# Patient Record
Sex: Female | Born: 1970 | Hispanic: No | Marital: Married | Smoking: Never smoker
Health system: Southern US, Community
[De-identification: ages and names within clinical notes are randomized; demographics above are authoritative.]

## PROBLEM LIST (undated history)

## (undated) DIAGNOSIS — F32A Depression, unspecified: Secondary | ICD-10-CM

## (undated) DIAGNOSIS — H5440 Blindness, one eye, unspecified eye: Secondary | ICD-10-CM

## (undated) DIAGNOSIS — Z8781 Personal history of (healed) traumatic fracture: Secondary | ICD-10-CM

## (undated) DIAGNOSIS — R87612 Low grade squamous intraepithelial lesion on cytologic smear of cervix (LGSIL): Secondary | ICD-10-CM

## (undated) DIAGNOSIS — I4891 Unspecified atrial fibrillation: Secondary | ICD-10-CM

## (undated) DIAGNOSIS — H209 Unspecified iridocyclitis: Secondary | ICD-10-CM

## (undated) DIAGNOSIS — E119 Type 2 diabetes mellitus without complications: Secondary | ICD-10-CM

## (undated) DIAGNOSIS — H547 Unspecified visual loss: Secondary | ICD-10-CM

## (undated) DIAGNOSIS — E785 Hyperlipidemia, unspecified: Secondary | ICD-10-CM

## (undated) DIAGNOSIS — I10 Essential (primary) hypertension: Secondary | ICD-10-CM

## (undated) DIAGNOSIS — H4040X0 Glaucoma secondary to eye inflammation, unspecified eye, stage unspecified: Secondary | ICD-10-CM

## (undated) DIAGNOSIS — L659 Nonscarring hair loss, unspecified: Secondary | ICD-10-CM

## (undated) DIAGNOSIS — Z412 Encounter for routine and ritual male circumcision: Secondary | ICD-10-CM

## (undated) DIAGNOSIS — D509 Iron deficiency anemia, unspecified: Secondary | ICD-10-CM

## (undated) HISTORY — DX: Low grade squamous intraepithelial lesion on cytologic smear of cervix (LGSIL): R87.612

## (undated) HISTORY — DX: Glaucoma secondary to eye inflammation, unspecified eye, stage unspecified: H40.40X0

## (undated) HISTORY — DX: Hyperlipidemia, unspecified: E78.5

## (undated) HISTORY — DX: Nonscarring hair loss, unspecified: L65.9

## (undated) HISTORY — DX: Iron deficiency anemia, unspecified: D50.9

## (undated) HISTORY — DX: Essential (primary) hypertension: I10

## (undated) HISTORY — DX: Encounter for routine and ritual male circumcision: Z41.2

## (undated) HISTORY — DX: Unspecified visual loss: H54.7

## (undated) HISTORY — DX: Type 2 diabetes mellitus without complications: E11.9

## (undated) HISTORY — DX: Unspecified iridocyclitis: H20.9

## (undated) HISTORY — DX: Personal history of (healed) traumatic fracture: Z87.81

## (undated) HISTORY — PX: EYE SURGERY: SHX253

## (undated) HISTORY — PX: MULTIPLE TOOTH EXTRACTIONS: SHX2053

---

## 1998-04-22 DIAGNOSIS — E119 Type 2 diabetes mellitus without complications: Secondary | ICD-10-CM | POA: Insufficient documentation

## 1999-09-11 ENCOUNTER — Emergency Department (HOSPITAL_COMMUNITY): Admission: EM | Admit: 1999-09-11 | Discharge: 1999-09-11 | Payer: Self-pay | Admitting: Emergency Medicine

## 2000-04-29 ENCOUNTER — Emergency Department (HOSPITAL_COMMUNITY): Admission: EM | Admit: 2000-04-29 | Discharge: 2000-04-30 | Payer: Self-pay | Admitting: Emergency Medicine

## 2001-02-24 ENCOUNTER — Encounter: Admission: RE | Admit: 2001-02-24 | Discharge: 2001-05-25 | Payer: Self-pay | Admitting: Family Medicine

## 2001-07-07 ENCOUNTER — Other Ambulatory Visit: Admission: RE | Admit: 2001-07-07 | Discharge: 2001-07-07 | Payer: Self-pay | Admitting: Obstetrics and Gynecology

## 2001-07-09 ENCOUNTER — Encounter: Admission: RE | Admit: 2001-07-09 | Discharge: 2001-10-07 | Payer: Self-pay | Admitting: Obstetrics and Gynecology

## 2001-08-26 ENCOUNTER — Encounter: Payer: Self-pay | Admitting: Obstetrics and Gynecology

## 2001-08-26 ENCOUNTER — Ambulatory Visit (HOSPITAL_COMMUNITY): Admission: RE | Admit: 2001-08-26 | Discharge: 2001-08-26 | Payer: Self-pay | Admitting: Obstetrics and Gynecology

## 2001-10-26 ENCOUNTER — Ambulatory Visit (HOSPITAL_COMMUNITY): Admission: RE | Admit: 2001-10-26 | Discharge: 2001-10-26 | Payer: Self-pay | Admitting: Obstetrics and Gynecology

## 2001-10-26 ENCOUNTER — Encounter: Payer: Self-pay | Admitting: Obstetrics and Gynecology

## 2001-10-27 ENCOUNTER — Ambulatory Visit (HOSPITAL_COMMUNITY): Admission: RE | Admit: 2001-10-27 | Discharge: 2001-10-27 | Payer: Self-pay | Admitting: Obstetrics and Gynecology

## 2001-10-27 ENCOUNTER — Encounter: Payer: Self-pay | Admitting: Obstetrics and Gynecology

## 2001-11-17 ENCOUNTER — Inpatient Hospital Stay (HOSPITAL_COMMUNITY): Admission: AD | Admit: 2001-11-17 | Discharge: 2001-11-20 | Payer: Self-pay | Admitting: Obstetrics and Gynecology

## 2001-11-17 ENCOUNTER — Encounter: Payer: Self-pay | Admitting: Obstetrics and Gynecology

## 2001-12-07 ENCOUNTER — Encounter (INDEPENDENT_AMBULATORY_CARE_PROVIDER_SITE_OTHER): Payer: Self-pay | Admitting: Specialist

## 2001-12-07 ENCOUNTER — Inpatient Hospital Stay (HOSPITAL_COMMUNITY): Admission: AD | Admit: 2001-12-07 | Discharge: 2001-12-10 | Payer: Self-pay | Admitting: Obstetrics and Gynecology

## 2003-07-12 ENCOUNTER — Encounter: Admission: RE | Admit: 2003-07-12 | Discharge: 2003-07-12 | Payer: Self-pay | Admitting: Internal Medicine

## 2003-07-20 ENCOUNTER — Encounter: Admission: RE | Admit: 2003-07-20 | Discharge: 2003-07-20 | Payer: Self-pay | Admitting: Internal Medicine

## 2003-10-06 ENCOUNTER — Encounter: Admission: RE | Admit: 2003-10-06 | Discharge: 2003-10-06 | Payer: Self-pay | Admitting: Internal Medicine

## 2003-10-20 ENCOUNTER — Encounter: Admission: RE | Admit: 2003-10-20 | Discharge: 2003-10-20 | Payer: Self-pay | Admitting: Internal Medicine

## 2003-12-06 ENCOUNTER — Encounter (INDEPENDENT_AMBULATORY_CARE_PROVIDER_SITE_OTHER): Payer: Self-pay | Admitting: *Deleted

## 2003-12-06 ENCOUNTER — Encounter: Admission: RE | Admit: 2003-12-06 | Discharge: 2003-12-06 | Payer: Self-pay | Admitting: Internal Medicine

## 2004-02-04 ENCOUNTER — Emergency Department (HOSPITAL_COMMUNITY): Admission: EM | Admit: 2004-02-04 | Discharge: 2004-02-04 | Payer: Self-pay | Admitting: Emergency Medicine

## 2004-06-29 ENCOUNTER — Ambulatory Visit: Payer: Self-pay | Admitting: Internal Medicine

## 2004-07-12 ENCOUNTER — Emergency Department (HOSPITAL_COMMUNITY): Admission: EM | Admit: 2004-07-12 | Discharge: 2004-07-12 | Payer: Self-pay | Admitting: Emergency Medicine

## 2004-07-16 ENCOUNTER — Encounter (INDEPENDENT_AMBULATORY_CARE_PROVIDER_SITE_OTHER): Payer: Self-pay | Admitting: Pulmonary Disease

## 2004-07-16 ENCOUNTER — Ambulatory Visit: Payer: Self-pay | Admitting: Internal Medicine

## 2004-07-16 LAB — CONVERTED CEMR LAB: Pap Smear: NORMAL

## 2004-09-19 ENCOUNTER — Ambulatory Visit: Payer: Self-pay | Admitting: Internal Medicine

## 2004-09-21 ENCOUNTER — Ambulatory Visit: Payer: Self-pay | Admitting: Internal Medicine

## 2004-11-14 ENCOUNTER — Ambulatory Visit (HOSPITAL_COMMUNITY): Admission: RE | Admit: 2004-11-14 | Discharge: 2004-11-14 | Payer: Self-pay | Admitting: Internal Medicine

## 2004-11-14 ENCOUNTER — Ambulatory Visit: Payer: Self-pay | Admitting: Internal Medicine

## 2004-11-28 ENCOUNTER — Ambulatory Visit (HOSPITAL_COMMUNITY): Admission: RE | Admit: 2004-11-28 | Discharge: 2004-11-28 | Payer: Self-pay | Admitting: Internal Medicine

## 2004-11-28 ENCOUNTER — Ambulatory Visit: Payer: Self-pay | Admitting: Internal Medicine

## 2004-12-25 ENCOUNTER — Ambulatory Visit: Payer: Self-pay | Admitting: Internal Medicine

## 2005-03-01 ENCOUNTER — Emergency Department (HOSPITAL_COMMUNITY): Admission: EM | Admit: 2005-03-01 | Discharge: 2005-03-01 | Payer: Self-pay | Admitting: Family Medicine

## 2005-03-05 ENCOUNTER — Ambulatory Visit: Payer: Self-pay | Admitting: Internal Medicine

## 2005-03-18 ENCOUNTER — Ambulatory Visit: Payer: Self-pay | Admitting: Internal Medicine

## 2005-03-19 ENCOUNTER — Ambulatory Visit: Payer: Self-pay | Admitting: Obstetrics & Gynecology

## 2005-03-27 ENCOUNTER — Ambulatory Visit: Payer: Self-pay | Admitting: Internal Medicine

## 2005-04-25 ENCOUNTER — Ambulatory Visit: Payer: Self-pay | Admitting: Family Medicine

## 2005-05-01 ENCOUNTER — Ambulatory Visit (HOSPITAL_COMMUNITY): Admission: RE | Admit: 2005-05-01 | Discharge: 2005-05-01 | Payer: Self-pay | Admitting: *Deleted

## 2005-05-25 ENCOUNTER — Emergency Department (HOSPITAL_COMMUNITY): Admission: EM | Admit: 2005-05-25 | Discharge: 2005-05-25 | Payer: Self-pay | Admitting: Emergency Medicine

## 2005-05-30 ENCOUNTER — Ambulatory Visit: Payer: Self-pay | Admitting: Internal Medicine

## 2005-06-05 ENCOUNTER — Ambulatory Visit: Payer: Self-pay | Admitting: Internal Medicine

## 2005-06-07 ENCOUNTER — Encounter: Admission: RE | Admit: 2005-06-07 | Discharge: 2005-06-07 | Payer: Self-pay | Admitting: Obstetrics and Gynecology

## 2005-08-12 ENCOUNTER — Ambulatory Visit: Payer: Self-pay | Admitting: Hospitalist

## 2005-10-07 ENCOUNTER — Ambulatory Visit: Payer: Self-pay | Admitting: Internal Medicine

## 2005-10-24 ENCOUNTER — Ambulatory Visit: Payer: Self-pay | Admitting: Internal Medicine

## 2006-01-31 ENCOUNTER — Encounter (INDEPENDENT_AMBULATORY_CARE_PROVIDER_SITE_OTHER): Payer: Self-pay | Admitting: Pulmonary Disease

## 2006-01-31 ENCOUNTER — Ambulatory Visit: Payer: Self-pay | Admitting: Internal Medicine

## 2006-02-05 ENCOUNTER — Encounter (INDEPENDENT_AMBULATORY_CARE_PROVIDER_SITE_OTHER): Payer: Self-pay | Admitting: Pulmonary Disease

## 2006-02-20 ENCOUNTER — Ambulatory Visit: Payer: Self-pay | Admitting: Internal Medicine

## 2006-03-06 ENCOUNTER — Ambulatory Visit: Payer: Self-pay | Admitting: Internal Medicine

## 2006-03-20 ENCOUNTER — Ambulatory Visit: Payer: Self-pay | Admitting: Internal Medicine

## 2006-04-08 ENCOUNTER — Ambulatory Visit: Payer: Self-pay | Admitting: Internal Medicine

## 2006-04-08 ENCOUNTER — Encounter (INDEPENDENT_AMBULATORY_CARE_PROVIDER_SITE_OTHER): Payer: Self-pay | Admitting: Pulmonary Disease

## 2006-04-08 LAB — CONVERTED CEMR LAB
Albumin: 4.1 g/dL (ref 3.5–5.2)
Alkaline Phosphatase: 101 units/L (ref 39–117)
BUN: 13 mg/dL (ref 6–23)
CO2: 23 meq/L (ref 19–32)
Creatinine, Ser: 0.53 mg/dL (ref 0.40–1.20)
Glucose, Bld: 114 mg/dL — ABNORMAL HIGH (ref 70–99)
Glucose, Bld: 155 mg/dL
Total Bilirubin: 0.2 mg/dL — ABNORMAL LOW (ref 0.3–1.2)
Total Protein: 7.6 g/dL (ref 6.0–8.3)

## 2006-05-05 DIAGNOSIS — E785 Hyperlipidemia, unspecified: Secondary | ICD-10-CM | POA: Insufficient documentation

## 2006-09-29 ENCOUNTER — Ambulatory Visit: Payer: Self-pay | Admitting: Internal Medicine

## 2006-09-29 ENCOUNTER — Inpatient Hospital Stay (HOSPITAL_COMMUNITY): Admission: AD | Admit: 2006-09-29 | Discharge: 2006-09-29 | Payer: Self-pay | Admitting: Gynecology

## 2006-09-29 LAB — CONVERTED CEMR LAB
Beta hcg, urine, semiquantitative: NEGATIVE
Glucose, Urine, Semiquant: NEGATIVE
Hgb A1c MFr Bld: 10.6 %
Specific Gravity, Urine: 1.02

## 2007-01-28 ENCOUNTER — Emergency Department (HOSPITAL_COMMUNITY): Admission: EM | Admit: 2007-01-28 | Discharge: 2007-01-29 | Payer: Self-pay | Admitting: Emergency Medicine

## 2007-02-05 ENCOUNTER — Ambulatory Visit: Payer: Self-pay | Admitting: Hospitalist

## 2007-02-05 ENCOUNTER — Encounter (INDEPENDENT_AMBULATORY_CARE_PROVIDER_SITE_OTHER): Payer: Self-pay | Admitting: Infectious Diseases

## 2007-02-05 LAB — CONVERTED CEMR LAB: Blood Glucose, Fingerstick: 269

## 2007-02-06 LAB — CONVERTED CEMR LAB
Bilirubin Urine: NEGATIVE
CO2: 22 meq/L (ref 19–32)
Chloride: 102 meq/L (ref 96–112)
Creatinine, Ser: 0.54 mg/dL (ref 0.40–1.20)
Nitrite: NEGATIVE
Potassium: 4.3 meq/L (ref 3.5–5.3)
Urine Glucose: >1000 mg/dL — AB
pH: 6 (ref 5.0–8.0)

## 2007-02-09 ENCOUNTER — Telehealth: Payer: Self-pay | Admitting: *Deleted

## 2007-03-26 ENCOUNTER — Encounter: Payer: Self-pay | Admitting: Internal Medicine

## 2007-03-26 ENCOUNTER — Ambulatory Visit: Payer: Self-pay | Admitting: Internal Medicine

## 2007-03-26 ENCOUNTER — Ambulatory Visit (HOSPITAL_COMMUNITY): Admission: RE | Admit: 2007-03-26 | Discharge: 2007-03-26 | Payer: Self-pay | Admitting: Internal Medicine

## 2007-03-26 ENCOUNTER — Telehealth: Payer: Self-pay | Admitting: *Deleted

## 2007-03-27 ENCOUNTER — Encounter: Payer: Self-pay | Admitting: Internal Medicine

## 2007-07-10 ENCOUNTER — Telehealth: Payer: Self-pay | Admitting: *Deleted

## 2007-07-28 ENCOUNTER — Ambulatory Visit: Payer: Self-pay

## 2007-07-28 ENCOUNTER — Encounter (INDEPENDENT_AMBULATORY_CARE_PROVIDER_SITE_OTHER): Payer: Self-pay | Admitting: Internal Medicine

## 2007-07-28 LAB — CONVERTED CEMR LAB
Bilirubin Urine: NEGATIVE
Blood in Urine, dipstick: NEGATIVE
Glucose, Urine, Semiquant: >=1000
Hgb A1c MFr Bld: 11.3 %
Specific Gravity, Urine: 1.025
Urobilinogen, UA: 0.2
WBC Urine, dipstick: NEGATIVE
pH: 5

## 2007-07-29 ENCOUNTER — Encounter (INDEPENDENT_AMBULATORY_CARE_PROVIDER_SITE_OTHER): Payer: Self-pay | Admitting: Internal Medicine

## 2007-07-29 LAB — CONVERTED CEMR LAB
AST: 13 units/L (ref 0–37)
Alkaline Phosphatase: 100 units/L (ref 39–117)
BUN: 12 mg/dL (ref 6–23)
Calcium: 8.8 mg/dL (ref 8.4–10.5)
Creatinine, Ser: 0.59 mg/dL (ref 0.40–1.20)
Creatinine, Urine: 78.5 mg/dL
HDL: 54 mg/dL (ref >39)
Hemoglobin, Urine: NEGATIVE
LDL Cholesterol: 122 mg/dL — ABNORMAL HIGH (ref 0–99)
Leukocytes, UA: NEGATIVE
Microalb, Ur: 1.38 mg/dL (ref 0.00–1.89)
Protein, ur: NEGATIVE mg/dL
Total Bilirubin: 0.3 mg/dL (ref 0.3–1.2)
Total CHOL/HDL Ratio: 3.8
Urine Glucose: >1000 mg/dL — AB

## 2007-08-18 ENCOUNTER — Encounter (INDEPENDENT_AMBULATORY_CARE_PROVIDER_SITE_OTHER): Payer: Self-pay | Admitting: Internal Medicine

## 2007-08-18 ENCOUNTER — Ambulatory Visit: Payer: Self-pay | Admitting: Internal Medicine

## 2007-08-19 LAB — CONVERTED CEMR LAB
Gardnerella vaginalis: POSITIVE — AB
Trichomonal Vaginitis: NEGATIVE

## 2007-09-01 ENCOUNTER — Encounter (INDEPENDENT_AMBULATORY_CARE_PROVIDER_SITE_OTHER): Payer: Self-pay | Admitting: Internal Medicine

## 2007-09-01 ENCOUNTER — Ambulatory Visit: Payer: Self-pay | Admitting: Internal Medicine

## 2007-09-07 ENCOUNTER — Ambulatory Visit: Payer: Self-pay | Admitting: *Deleted

## 2007-09-07 LAB — CONVERTED CEMR LAB: Blood Glucose, Fingerstick: 437

## 2007-09-08 LAB — CONVERTED CEMR LAB
Iron: 29 ug/dL — ABNORMAL LOW (ref 42–145)
TSH: 2.145 microintl units/mL (ref 0.350–5.50)

## 2007-09-10 ENCOUNTER — Encounter (INDEPENDENT_AMBULATORY_CARE_PROVIDER_SITE_OTHER): Payer: Self-pay | Admitting: *Deleted

## 2007-09-17 ENCOUNTER — Ambulatory Visit: Payer: Self-pay | Admitting: Obstetrics & Gynecology

## 2007-10-13 ENCOUNTER — Encounter (INDEPENDENT_AMBULATORY_CARE_PROVIDER_SITE_OTHER): Payer: Self-pay | Admitting: Internal Medicine

## 2008-02-01 ENCOUNTER — Encounter: Payer: Self-pay | Admitting: Internal Medicine

## 2008-02-01 ENCOUNTER — Ambulatory Visit: Payer: Self-pay | Admitting: Internal Medicine

## 2008-02-01 LAB — CONVERTED CEMR LAB
ALT: 10 units/L (ref 0–35)
Alkaline Phosphatase: 91 units/L (ref 39–117)
Basophils Absolute: 0 10*3/uL (ref 0.0–0.1)
Basophils Relative: 1 % (ref 0–1)
Blood Glucose, Fingerstick: 353
Creatinine, Ser: 0.65 mg/dL (ref 0.40–1.20)
Eosinophils Absolute: 0.9 10*3/uL — ABNORMAL HIGH (ref 0.0–0.7)
Eosinophils Relative: 15 % — ABNORMAL HIGH (ref 0–5)
HCT: 36.4 % (ref 36.0–46.0)
Hgb A1c MFr Bld: 12.1 %
Ketones, ur: NEGATIVE mg/dL
LDL Cholesterol: 131 mg/dL — ABNORMAL HIGH (ref 0–99)
MCHC: 30.2 g/dL (ref 30.0–36.0)
MCV: 71.5 fL — ABNORMAL LOW (ref 78.0–100.0)
Microalb Creat Ratio: 9.5 mg/g (ref 0.0–30.0)
Nitrite: NEGATIVE
Platelets: 195 10*3/uL (ref 150–400)
Protein, ur: NEGATIVE mg/dL
RDW: 14.7 % (ref 11.5–15.5)
Sodium: 132 meq/L — ABNORMAL LOW (ref 135–145)
Specific Gravity, Urine: 1.048 — ABNORMAL HIGH (ref 1.005–1.03)
Total Bilirubin: 0.2 mg/dL — ABNORMAL LOW (ref 0.3–1.2)
Total CHOL/HDL Ratio: 3.5
Total Protein: 7.5 g/dL (ref 6.0–8.3)
Triglycerides: 122 mg/dL (ref <150)
Urobilinogen, UA: 0.2 (ref 0.0–1.0)
VLDL: 24 mg/dL (ref 0–40)
WBC, UA: NONE SEEN cells/hpf (ref <3)

## 2008-02-04 ENCOUNTER — Telehealth (INDEPENDENT_AMBULATORY_CARE_PROVIDER_SITE_OTHER): Payer: Self-pay | Admitting: *Deleted

## 2008-02-04 ENCOUNTER — Ambulatory Visit: Payer: Self-pay | Admitting: Infectious Disease

## 2008-02-04 LAB — CONVERTED CEMR LAB: Blood Glucose, Home Monitor: 2 mg/dL

## 2008-02-08 ENCOUNTER — Ambulatory Visit: Payer: Self-pay | Admitting: Internal Medicine

## 2008-02-08 ENCOUNTER — Telehealth: Payer: Self-pay | Admitting: Licensed Clinical Social Worker

## 2008-02-08 ENCOUNTER — Encounter (INDEPENDENT_AMBULATORY_CARE_PROVIDER_SITE_OTHER): Payer: Self-pay | Admitting: Internal Medicine

## 2008-02-08 DIAGNOSIS — H209 Unspecified iridocyclitis: Secondary | ICD-10-CM | POA: Insufficient documentation

## 2008-02-09 ENCOUNTER — Encounter (INDEPENDENT_AMBULATORY_CARE_PROVIDER_SITE_OTHER): Payer: Self-pay | Admitting: Internal Medicine

## 2008-02-10 ENCOUNTER — Ambulatory Visit (HOSPITAL_COMMUNITY): Admission: RE | Admit: 2008-02-10 | Discharge: 2008-02-10 | Payer: Self-pay | Admitting: Neurology

## 2008-02-10 ENCOUNTER — Encounter: Payer: Self-pay | Admitting: Internal Medicine

## 2008-02-10 ENCOUNTER — Ambulatory Visit: Payer: Self-pay | Admitting: Internal Medicine

## 2008-02-12 DIAGNOSIS — H4040X0 Glaucoma secondary to eye inflammation, unspecified eye, stage unspecified: Secondary | ICD-10-CM | POA: Insufficient documentation

## 2008-02-12 HISTORY — DX: Glaucoma secondary to eye inflammation, unspecified eye, stage unspecified: H40.40X0

## 2008-02-14 LAB — CONVERTED CEMR LAB
ALT: 9 units/L (ref 0–35)
AST: 14 units/L (ref 0–37)
CO2: 23 meq/L (ref 19–32)
CRP: 1.2 mg/dL — ABNORMAL HIGH (ref <0.6)
Calcium: 9 mg/dL (ref 8.4–10.5)
Chloride: 105 meq/L (ref 96–112)
Sed Rate: 17 mm/hr (ref 0–22)
Sodium: 135 meq/L (ref 135–145)
Total Protein: 7.1 g/dL (ref 6.0–8.3)

## 2008-02-23 ENCOUNTER — Ambulatory Visit: Payer: Self-pay | Admitting: Infectious Diseases

## 2008-02-23 ENCOUNTER — Encounter (INDEPENDENT_AMBULATORY_CARE_PROVIDER_SITE_OTHER): Payer: Self-pay | Admitting: Internal Medicine

## 2008-02-23 LAB — CONVERTED CEMR LAB: RBC Folate: 991 ng/mL — ABNORMAL HIGH (ref 180–600)

## 2008-02-29 DIAGNOSIS — D509 Iron deficiency anemia, unspecified: Secondary | ICD-10-CM | POA: Insufficient documentation

## 2008-02-29 LAB — CONVERTED CEMR LAB
Iron: 25 ug/dL — ABNORMAL LOW (ref 42–145)
Microalb Creat Ratio: 12.6 mg/g (ref 0.0–30.0)
Microalb, Ur: 0.67 mg/dL (ref 0.00–1.89)
Saturation Ratios: 5 % — ABNORMAL LOW (ref 20–55)
TIBC: 462 ug/dL (ref 250–470)
Vitamin B-12: 470 pg/mL (ref 211–911)

## 2008-03-01 ENCOUNTER — Encounter: Payer: Self-pay | Admitting: Internal Medicine

## 2008-03-01 ENCOUNTER — Ambulatory Visit: Payer: Self-pay | Admitting: Internal Medicine

## 2008-03-07 ENCOUNTER — Encounter (INDEPENDENT_AMBULATORY_CARE_PROVIDER_SITE_OTHER): Payer: Self-pay | Admitting: Internal Medicine

## 2008-03-07 ENCOUNTER — Ambulatory Visit: Payer: Self-pay | Admitting: *Deleted

## 2008-03-07 LAB — CONVERTED CEMR LAB

## 2008-03-14 ENCOUNTER — Encounter (INDEPENDENT_AMBULATORY_CARE_PROVIDER_SITE_OTHER): Payer: Self-pay | Admitting: Internal Medicine

## 2008-04-06 ENCOUNTER — Ambulatory Visit: Payer: Self-pay | Admitting: Internal Medicine

## 2008-04-06 ENCOUNTER — Encounter (INDEPENDENT_AMBULATORY_CARE_PROVIDER_SITE_OTHER): Payer: Self-pay | Admitting: Internal Medicine

## 2008-06-02 ENCOUNTER — Encounter: Payer: Self-pay | Admitting: Internal Medicine

## 2008-08-16 ENCOUNTER — Ambulatory Visit: Payer: Self-pay | Admitting: Internal Medicine

## 2008-08-16 ENCOUNTER — Encounter (INDEPENDENT_AMBULATORY_CARE_PROVIDER_SITE_OTHER): Payer: Self-pay | Admitting: Internal Medicine

## 2008-08-16 LAB — CONVERTED CEMR LAB: Blood Glucose, Fingerstick: 305

## 2008-08-17 ENCOUNTER — Telehealth (INDEPENDENT_AMBULATORY_CARE_PROVIDER_SITE_OTHER): Payer: Self-pay | Admitting: Internal Medicine

## 2008-09-28 ENCOUNTER — Ambulatory Visit: Payer: Self-pay | Admitting: Internal Medicine

## 2008-09-28 ENCOUNTER — Ambulatory Visit (HOSPITAL_COMMUNITY): Admission: RE | Admit: 2008-09-28 | Discharge: 2008-09-28 | Payer: Self-pay | Admitting: Internal Medicine

## 2008-09-28 LAB — CONVERTED CEMR LAB: Blood Glucose, Fingerstick: 375

## 2008-10-03 ENCOUNTER — Encounter (INDEPENDENT_AMBULATORY_CARE_PROVIDER_SITE_OTHER): Payer: Self-pay | Admitting: Internal Medicine

## 2008-10-03 ENCOUNTER — Ambulatory Visit: Payer: Self-pay | Admitting: Internal Medicine

## 2008-10-03 LAB — CONVERTED CEMR LAB: Blood Glucose, Fingerstick: 322

## 2008-10-09 LAB — CONVERTED CEMR LAB
ALT: <8 units/L (ref 0–35)
AST: 9 units/L (ref 0–37)
Alkaline Phosphatase: 101 units/L (ref 39–117)
BUN: 12 mg/dL (ref 6–23)
Calcium: 9.2 mg/dL (ref 8.4–10.5)
Chloride: 101 meq/L (ref 96–112)
Cholesterol: 187 mg/dL (ref 0–200)
Creatinine, Ser: 0.57 mg/dL (ref 0.40–1.20)
HCT: 38.2 % (ref 36.0–46.0)
Hemoglobin: 11.7 g/dL — ABNORMAL LOW (ref 12.0–15.0)
LDL Cholesterol: 104 mg/dL — ABNORMAL HIGH (ref 0–99)
MCHC: 30.6 g/dL (ref 30.0–36.0)
MCV: 79.6 fL (ref 78.0–100.0)
RBC: 4.8 M/uL (ref 3.87–5.11)
Total CHOL/HDL Ratio: 3.1
VLDL: 22 mg/dL (ref 0–40)
WBC: 5.9 10*3/uL (ref 4.0–10.5)

## 2008-10-11 ENCOUNTER — Encounter (INDEPENDENT_AMBULATORY_CARE_PROVIDER_SITE_OTHER): Payer: Self-pay | Admitting: Internal Medicine

## 2008-10-11 ENCOUNTER — Ambulatory Visit (HOSPITAL_COMMUNITY): Admission: RE | Admit: 2008-10-11 | Discharge: 2008-10-11 | Payer: Self-pay | Admitting: Internal Medicine

## 2008-10-11 ENCOUNTER — Ambulatory Visit: Payer: Self-pay | Admitting: Surgery

## 2008-11-02 ENCOUNTER — Ambulatory Visit: Payer: Self-pay | Admitting: Internal Medicine

## 2008-11-02 LAB — CONVERTED CEMR LAB
Blood Glucose, Fingerstick: 220
Blood Glucose, Home Monitor: 3 mg/dL

## 2008-11-10 ENCOUNTER — Ambulatory Visit: Payer: Self-pay | Admitting: Internal Medicine

## 2008-11-10 ENCOUNTER — Encounter: Payer: Self-pay | Admitting: Internal Medicine

## 2008-11-10 LAB — CONVERTED CEMR LAB
Creatinine, Urine: 143.1 mg/dL
Hgb A1c MFr Bld: 10.3 %

## 2008-11-16 ENCOUNTER — Encounter (INDEPENDENT_AMBULATORY_CARE_PROVIDER_SITE_OTHER): Payer: Self-pay | Admitting: Internal Medicine

## 2008-11-22 ENCOUNTER — Encounter (INDEPENDENT_AMBULATORY_CARE_PROVIDER_SITE_OTHER): Payer: Self-pay | Admitting: Internal Medicine

## 2008-12-16 ENCOUNTER — Ambulatory Visit: Payer: Self-pay | Admitting: Internal Medicine

## 2008-12-16 LAB — CONVERTED CEMR LAB
Bilirubin Urine: NEGATIVE
Candida species: POSITIVE — AB
Gardnerella vaginalis: NEGATIVE
Hemoglobin, Urine: NEGATIVE
Protein, ur: NEGATIVE mg/dL
Urine Glucose: >1000 mg/dL — AB
Urobilinogen, UA: 0.2 (ref 0.0–1.0)

## 2008-12-19 ENCOUNTER — Encounter (INDEPENDENT_AMBULATORY_CARE_PROVIDER_SITE_OTHER): Payer: Self-pay | Admitting: Internal Medicine

## 2008-12-28 ENCOUNTER — Ambulatory Visit: Payer: Self-pay | Admitting: Infectious Diseases

## 2008-12-28 LAB — CONVERTED CEMR LAB: Beta hcg, urine, semiquantitative: NEGATIVE

## 2009-01-26 ENCOUNTER — Encounter: Payer: Self-pay | Admitting: Internal Medicine

## 2009-01-26 ENCOUNTER — Encounter: Payer: Self-pay | Admitting: Obstetrics and Gynecology

## 2009-01-26 ENCOUNTER — Ambulatory Visit: Payer: Self-pay | Admitting: Obstetrics & Gynecology

## 2009-01-26 LAB — CONVERTED CEMR LAB
Trich, Wet Prep: NONE SEEN
Yeast Wet Prep HPF POC: NONE SEEN

## 2009-02-03 ENCOUNTER — Ambulatory Visit (HOSPITAL_COMMUNITY): Admission: RE | Admit: 2009-02-03 | Discharge: 2009-02-03 | Payer: Self-pay | Admitting: Obstetrics & Gynecology

## 2009-02-17 ENCOUNTER — Ambulatory Visit: Payer: Self-pay | Admitting: Obstetrics & Gynecology

## 2009-02-21 ENCOUNTER — Emergency Department (HOSPITAL_COMMUNITY): Admission: EM | Admit: 2009-02-21 | Discharge: 2009-02-21 | Payer: Self-pay | Admitting: Emergency Medicine

## 2009-02-27 ENCOUNTER — Ambulatory Visit (HOSPITAL_COMMUNITY): Admission: RE | Admit: 2009-02-27 | Discharge: 2009-02-27 | Payer: Self-pay | Admitting: Family Medicine

## 2009-03-01 ENCOUNTER — Ambulatory Visit: Payer: Self-pay | Admitting: Obstetrics and Gynecology

## 2009-03-01 ENCOUNTER — Other Ambulatory Visit: Admission: RE | Admit: 2009-03-01 | Discharge: 2009-03-01 | Payer: Self-pay | Admitting: Obstetrics and Gynecology

## 2009-03-29 ENCOUNTER — Ambulatory Visit: Payer: Self-pay | Admitting: Obstetrics and Gynecology

## 2009-04-11 ENCOUNTER — Telehealth: Payer: Self-pay | Admitting: Internal Medicine

## 2009-04-13 ENCOUNTER — Ambulatory Visit: Payer: Self-pay | Admitting: Internal Medicine

## 2009-04-13 LAB — CONVERTED CEMR LAB
Albumin: 3.3 g/dL — ABNORMAL LOW (ref 3.5–5.2)
Alkaline Phosphatase: 109 units/L (ref 39–117)
BUN: 9 mg/dL (ref 6–23)
Basophils Absolute: 0 10*3/uL (ref 0.0–0.1)
Basophils Relative: 0 % (ref 0–1)
Creatinine, Ser: 0.6 mg/dL (ref 0.40–1.20)
Eosinophils Absolute: 0.5 10*3/uL (ref 0.0–0.7)
Eosinophils Relative: 6 % — ABNORMAL HIGH (ref 0–5)
Glucose, Bld: 443 mg/dL — ABNORMAL HIGH (ref 70–99)
HCT: 38.2 % (ref 36.0–46.0)
Hemoglobin: 12.7 g/dL (ref 12.0–15.0)
MCHC: 33.1 g/dL (ref 30.0–36.0)
MCV: 83.6 fL (ref >=78.0)
Monocytes Absolute: 0.3 10*3/uL (ref 0.1–1.0)
Platelets: 186 10*3/uL (ref 150–400)
Potassium: 3.6 meq/L (ref 3.5–5.3)
RDW: 12.8 % (ref 11.5–15.5)
Total Bilirubin: 0.3 mg/dL (ref 0.3–1.2)

## 2009-04-19 ENCOUNTER — Ambulatory Visit: Payer: Self-pay | Admitting: Internal Medicine

## 2009-04-19 ENCOUNTER — Encounter (INDEPENDENT_AMBULATORY_CARE_PROVIDER_SITE_OTHER): Payer: Self-pay | Admitting: Internal Medicine

## 2009-04-19 LAB — CONVERTED CEMR LAB: Blood Glucose, Fingerstick: 310

## 2009-04-20 ENCOUNTER — Encounter (INDEPENDENT_AMBULATORY_CARE_PROVIDER_SITE_OTHER): Payer: Self-pay | Admitting: Internal Medicine

## 2009-04-21 ENCOUNTER — Encounter (INDEPENDENT_AMBULATORY_CARE_PROVIDER_SITE_OTHER): Payer: Self-pay | Admitting: Internal Medicine

## 2009-04-24 ENCOUNTER — Ambulatory Visit: Payer: Self-pay | Admitting: Internal Medicine

## 2009-04-24 ENCOUNTER — Encounter (INDEPENDENT_AMBULATORY_CARE_PROVIDER_SITE_OTHER): Payer: Self-pay | Admitting: Internal Medicine

## 2009-04-24 ENCOUNTER — Encounter: Payer: Self-pay | Admitting: Licensed Clinical Social Worker

## 2009-04-24 LAB — CONVERTED CEMR LAB: Blood Glucose, Fingerstick: 178

## 2009-04-24 LAB — HM DIABETES FOOT EXAM

## 2009-04-25 ENCOUNTER — Telehealth (INDEPENDENT_AMBULATORY_CARE_PROVIDER_SITE_OTHER): Payer: Self-pay | Admitting: *Deleted

## 2009-05-09 ENCOUNTER — Ambulatory Visit: Payer: Self-pay | Admitting: Internal Medicine

## 2009-05-09 DIAGNOSIS — B372 Candidiasis of skin and nail: Secondary | ICD-10-CM

## 2009-05-11 ENCOUNTER — Encounter (INDEPENDENT_AMBULATORY_CARE_PROVIDER_SITE_OTHER): Payer: Self-pay | Admitting: Internal Medicine

## 2009-07-06 ENCOUNTER — Encounter (INDEPENDENT_AMBULATORY_CARE_PROVIDER_SITE_OTHER): Payer: Self-pay | Admitting: Internal Medicine

## 2009-07-18 ENCOUNTER — Ambulatory Visit: Payer: Self-pay | Admitting: Internal Medicine

## 2009-07-18 LAB — CONVERTED CEMR LAB: Blood Glucose, Fingerstick: 422

## 2009-07-20 LAB — CONVERTED CEMR LAB
BUN: 9 mg/dL (ref 6–23)
CO2: 19 meq/L (ref 19–32)
Chloride: 100 meq/L (ref 96–112)
Creatinine, Ser: 0.58 mg/dL (ref 0.40–1.20)
HCT: 36.5 % (ref 36.0–46.0)
Hemoglobin: 11.1 g/dL — ABNORMAL LOW (ref 12.0–15.0)
RDW: 13.3 % (ref 11.5–15.5)

## 2009-08-03 ENCOUNTER — Ambulatory Visit: Payer: Self-pay | Admitting: Internal Medicine

## 2009-08-03 LAB — CONVERTED CEMR LAB: Blood Glucose, Fingerstick: 258

## 2009-08-21 ENCOUNTER — Encounter (INDEPENDENT_AMBULATORY_CARE_PROVIDER_SITE_OTHER): Payer: Self-pay | Admitting: Internal Medicine

## 2009-08-25 ENCOUNTER — Emergency Department (HOSPITAL_COMMUNITY): Admission: EM | Admit: 2009-08-25 | Discharge: 2009-08-25 | Payer: Self-pay | Admitting: Emergency Medicine

## 2009-09-04 ENCOUNTER — Ambulatory Visit: Payer: Self-pay | Admitting: Infectious Disease

## 2009-09-04 LAB — CONVERTED CEMR LAB: Blood Glucose, Fingerstick: 264

## 2009-09-06 LAB — CONVERTED CEMR LAB
Ferritin: 8 ng/mL — ABNORMAL LOW (ref 10–291)
Hemoglobin: 10.5 g/dL — ABNORMAL LOW (ref 12.0–15.0)
RBC: 4.38 M/uL (ref 3.87–5.11)
Saturation Ratios: 7 % — ABNORMAL LOW (ref 20–55)
TIBC: 393 ug/dL (ref 250–470)
UIBC: 365 ug/dL

## 2009-09-13 ENCOUNTER — Encounter (INDEPENDENT_AMBULATORY_CARE_PROVIDER_SITE_OTHER): Payer: Self-pay | Admitting: Internal Medicine

## 2009-09-20 ENCOUNTER — Ambulatory Visit: Payer: Self-pay | Admitting: Internal Medicine

## 2009-09-20 LAB — CONVERTED CEMR LAB: Hgb A1c MFr Bld: 11.9 %

## 2009-12-05 ENCOUNTER — Encounter: Payer: Self-pay | Admitting: Internal Medicine

## 2010-01-25 ENCOUNTER — Telehealth: Payer: Self-pay | Admitting: Internal Medicine

## 2010-01-31 ENCOUNTER — Ambulatory Visit: Payer: Self-pay | Admitting: Internal Medicine

## 2010-01-31 DIAGNOSIS — R300 Dysuria: Secondary | ICD-10-CM | POA: Insufficient documentation

## 2010-01-31 LAB — CONVERTED CEMR LAB
Nitrite: NEGATIVE
Specific Gravity, Urine: >=1.03
Urobilinogen, UA: NEGATIVE
pH: 5.5

## 2010-02-06 ENCOUNTER — Ambulatory Visit: Payer: Self-pay | Admitting: Internal Medicine

## 2010-02-06 LAB — CONVERTED CEMR LAB
Cholesterol: 173 mg/dL (ref 0–200)
HCT: 35.2 % — ABNORMAL LOW (ref 36.0–46.0)
Iron: 28 ug/dL — ABNORMAL LOW (ref 42–145)
MCV: 78.6 fL (ref >=78.0)
Microalb, Ur: 4.31 mg/dL — ABNORMAL HIGH (ref 0.00–1.89)
RBC: 4.48 M/uL (ref 3.87–5.11)
Total CHOL/HDL Ratio: 3
VLDL: 13 mg/dL (ref 0–40)
WBC: 7.6 10*3/uL (ref 4.0–10.5)

## 2010-03-20 ENCOUNTER — Ambulatory Visit: Payer: Self-pay | Admitting: Internal Medicine

## 2010-03-20 ENCOUNTER — Encounter: Payer: Self-pay | Admitting: Internal Medicine

## 2010-03-20 LAB — CONVERTED CEMR LAB
Bilirubin Urine: NEGATIVE
Blood Glucose, Fingerstick: 116
Ketones, urine, test strip: NEGATIVE
Nitrite: NEGATIVE
pH: 5.5

## 2010-03-21 ENCOUNTER — Telehealth: Payer: Self-pay | Admitting: Internal Medicine

## 2010-03-21 LAB — CONVERTED CEMR LAB
Candida species: POSITIVE — AB
Chlamydia, DNA Probe: NEGATIVE
GC Probe Amp, Genital: NEGATIVE
Gardnerella vaginalis: NEGATIVE

## 2010-03-22 ENCOUNTER — Telehealth: Payer: Self-pay | Admitting: Internal Medicine

## 2010-05-13 ENCOUNTER — Encounter: Payer: Self-pay | Admitting: *Deleted

## 2010-05-21 ENCOUNTER — Telehealth (INDEPENDENT_AMBULATORY_CARE_PROVIDER_SITE_OTHER): Payer: Self-pay | Admitting: *Deleted

## 2010-05-22 NOTE — Assessment & Plan Note (Signed)
Summary: DM TRAINING/VS  Is Patient Diabetic? Yes Did you bring your meter with you today? Yes   Allergies: 1)  ! * Pork   Complete Medication List: 1)  Metformin Hcl 1000 Mg Tabs (Metformin hcl) .... Take 1 tablet by mouth two times a day 2)  Truetrack Blood Glucose Devi (Blood glucose monitoring suppl) .... Test blood sugar 2-4 times a day as directed 3)  Lantus Solostar 100 Unit/ml Soln (Insulin glargine) .... Inject 32 units lantus every evening about the same time 4)  Combigan 0.2-0.5 % Soln (Brimonidine tartrate-timolol) .... One drop twice daily to affected eye. 5)  Acyclovir 800 Mg Tabs (Acyclovir) .... Take 1 tablet by mouth two times a day 6)  Isopto Hyoscine 0.25 % Soln (Scopolamine hbr) 7)  Iron Supplement 325 (65 Fe) Mg Tabs (Ferrous sulfate) .... Take 1 tablet by mouth three times a day 8)  Prodigy Blood Glucose Test Strp (Glucose blood) .... Use to check blood sugar three times a day before meals 9)  Prodigy Twist Top Lancets 28g Misc (Lancets) .... Use to test blood sugars 3x a day 10)  Prodigy Insulin Syringe 31g X 5/16" 0.5 Ml Misc (Insulin syringe-needle u-100) .... Use to inject insulin once daily 11)  Doxycycline Hyclate 100 Mg Solr (Doxycycline hyclate) .... Take 1 tablet by mouth twice a day. 12)  Tramadol Hcl 50 Mg Tabs (Tramadol hcl) .... Take 1 tablet twice a day for pain as needed.  Other Orders: DSMT (Medicaid) 60 Minutes [phone])  Diabetes Self Management Training  PCP: Joaquin Courts  MD Referring MD: wilson Date diagnosed with diabetes: 04/22/1998 Diabetes Type: Type 2 insulin treated Other persons present: Spouse and an arabic interpreter Current smoking Status: never  Assessment Daily activities: cares for self and children Sources of Support: husband  Health Care Beliefs  Cultural Special Dietary needs: does NOT fast at all  Diabetes Medications:  Comments: brought meter for download. CBgs higher after lunch and dinner. fasting 123-170  range, 200-400 in the afternoon and evening and midsleep checks. she is trying her best to test 2-4x daily. over holidays tested 1x/day onseveral days but toherwise is accomp;ishing 2-4x daily. eats 8-1 and 9 om, with snacks between meals because hs eis "very hungry". asks if increasing her lantus will decrease her afternoon and after meal blood sugars.   Long Acting  Insulin Type:Lantus Breakfast Dose: 32 units    Monitoring Self monitoring blood glucose 3 times a day Name of Meter  Prodigy Autocode Measures urine ketones? No  Time of Testing  Before Breakfast  Before Lunch  Before Dinner  After Dinner  Recent Episodes of: Requiring Help from another person  Hyperglycemia : Yes Hypoglycemia: Yes Severe Hypoglycemia : No   Wears Medical I.D. No   Estimated /Usual Carb Intake Breakfast # of Carbs/Grams 3=45gm, sweet roll, tea, salad Midmorning # of Carbs/Grams 1=15gm Lunch # of Carbs/Grams 3=45gm, okra & potatoes rice meat, cheese BIGGEST MEAL Midafternoon # of Carbs/Grams 1=15gm sandwich, cheese boiled egg hummus Dinner # of Carbs/Grams 3=45gm, salad, eggs, fava beans- lighter than midday meal Bedtime # of Carbs/Grams 1=15gm  Nutrition assessment ETOH : No What beverages do you drink?  water, diet drinks, some juice nad tea and coffee with 1/2 spoon sugr and 1/2 the cup is milk Diabetes Disease Process  Discussed today  Medications  Nutritional Management State changes planned for home meals/snacks: Needs review/assistance    Monitoring State purpose and frequency of monitoring BG-ketones-HgbA1C  : Needs review/assistance  State target blood glucose and HgbA1C goals: Needs review/assistance    Complications State the causes-signs and symptoms and prevention of Hyperglycemia: Demonstrates competency   Explain proper treatment of hyperglycemia: Demonstrates competency   State the causes- signs and symptoms and prevention of hypoglycemia: Needs  review/assistance   B/P and lipid control in the prevention/control of cardiovascular disease: Demonstrates competencyState the principles of skin-dental and foot care: Needs review/assistanceDescribe symptoms of skin and foot problems and describe foot exam: Needs review/assistance Exercise  Lifestyle changes:Goal setting and Problem solving State benefits of making appropriate lifestyle changes: Needs review/assistance   Identify lifestyle behaviors that need to change: Needs review/assistance   Develop strategies to reduce risk factors: Needs review/assistance   Diabetes Management Education Done: 04/24/2009    BEHAVIORAL GOAL FOLLOW UP  Goal attained Specific goal set today: no new goals set today. patient reports never forgetting medicine- metformin or lantus insulin      Diabetes Self Management Support: husband, interpreter  and clinic staff Follow- up: 2 months

## 2010-05-22 NOTE — Letter (Signed)
Summary: [facility]   [facility]   Imported By: Margie Billet 09/20/2009 14:28:12  _____________________________________________________________________  External Attachment:    Type:   Image     Comment:   External Document  Appended Document: [facility]    Diabetic Eye Exam  Procedure date:  08/29/2009  Findings:      appointment with Dr. Lottie Dawson at [facility] 09/13/09  Procedures Next Due Date:    Diabetic Eye Exam: 09/2009   Diabetic Eye Exam  Procedure date:  08/29/2009  Findings:      appointment with Dr. Lottie Dawson at [facility] 09/13/09  Procedures Next Due Date:    Diabetic Eye Exam: 09/2009

## 2010-05-22 NOTE — Letter (Signed)
Summary: Pharmacologist   Imported By: Florinda Marker 04/24/2009 15:23:51  _____________________________________________________________________  External Attachment:    Type:   Image     Comment:   External Document

## 2010-05-22 NOTE — Assessment & Plan Note (Signed)
Summary: 1WK F/U/WILSON/VS   Vital Signs:  Patient profile:   40 year old female Height:      64 inches (162.56 cm) Weight:      194.7 pounds (88.50 kg) BMI:     33.54 Temp:     97.2 degrees F (36.22 degrees C) oral Pulse rate:   79 / minute BP sitting:   113 / 76  (right arm)  Vitals Entered By: Stanton Kidney Ditzler RN (April 24, 2009 10:25 AM) Is Patient Diabetic? Yes Did you bring your meter with you today? Yes Pain Assessment Patient in pain? no      Nutritional Status BMI of > 30 = obese Nutritional Status Detail appetite good CBG Result 178  Have you ever been in a relationship where you felt threatened, hurt or afraid?denies   Does patient need assistance? Functional Status Self care Ambulation Normal Comments Interpreter with pt. FU.   Primary Care Provider:  Joaquin Courts  MD   History of Present Illness: This is a 40  year old woman with past medical history of DM (uncontrolled), HLD and recent gluteal abcess who is here for one week follow up visit.  She reports that she has had no pain, no drainage, no fevers or chills at the site of the gluteal abcess.  It is completely healed.  She has met with DR before my visit and they have reviewed her cbg's.  She reports that cbg's range from 120-400's.  She is hungry all the time and snacks constantly. She has had no chest pain, shortness of breath or LE swelling.  No polyuria, polydypsia or vision change.  Interpreter helped with this visit.  Husband is in the room for all but exam.     Depression History:      The patient denies a depressed mood most of the day and a diminished interest in her usual daily activities.         Preventive Screening-Counseling & Management  Alcohol-Tobacco     Alcohol drinks/day: 0     Smoking Status: never  Caffeine-Diet-Exercise     Caffeine use/day: 1 expresso a day     Does Patient Exercise: no     Type of exercise: WALKING     Times/week: 3-4  Current Medications  (verified): 1)  Metformin Hcl 1000 Mg Tabs (Metformin Hcl) .... Take 1 Tablet By Mouth Two Times A Day 2)  Truetrack Blood Glucose  Devi (Blood Glucose Monitoring Suppl) .... Test Blood Sugar 2-4 Times A Day As Directed 3)  Lantus Solostar 100 Unit/ml Soln (Insulin Glargine) .... Inject 32 Units Lantus Every Evening About The Same Time 4)  Combigan 0.2-0.5 % Soln (Brimonidine Tartrate-Timolol) .... One Drop Twice Daily To Affected Eye. 5)  Acyclovir 800 Mg Tabs (Acyclovir) .... Take 1 Tablet By Mouth Two Times A Day 6)  Isopto Hyoscine 0.25 % Soln (Scopolamine Hbr) 7)  Iron Supplement 325 (65 Fe) Mg Tabs (Ferrous Sulfate) .... Take 1 Tablet By Mouth Three Times A Day 8)  Prodigy Blood Glucose Test  Strp (Glucose Blood) .... Use To Check Blood Sugar Three Times A Day Before Meals 9)  Prodigy Twist Top Lancets 28g  Misc (Lancets) .... Use To Test Blood Sugars 3x A Day 10)  Prodigy Insulin Syringe 31g X 5/16" 0.5 Ml Misc (Insulin Syringe-Needle U-100) .... Use To Inject Insulin Once Daily  Allergies: 1)  ! * Pork  Review of Systems       per hpi  Physical Exam  General:  alert and well-developed.   Lungs:  normal respiratory effort and normal breath sounds.   Heart:  normal rate, regular rhythm, and no murmur.   Pulses:  2+ Extremities:  no edema Neurologic:  alert & oriented X3, cranial nerves II-XII intact, and gait normal.   Skin:  small well healed scar in the gluteal cleft.   Diabetes Management Exam:    Foot Exam (with socks and/or shoes not present):       Sensory-Monofilament:          Left foot: normal          Right foot: normal   Impression & Recommendations:  Problem # 1:  ABSCESS, GLUTEAL (ICD-682.5) resolved.  The following medications were removed from the medication list:    Doxycycline Hyclate 100 Mg Solr (Doxycycline hyclate) .Marland Kitchen... Take 1 tablet by mouth twice a day.  Problem # 2:  DIABETES MELLITUS, TYPE II, UNCONTROLLED (ICD-250.02) Last A1C 11+.  Have  discussed with DR and agree that meal time coverage may be appropriate.  cbg's trend upward during the day.  Will start 2 units of novolog with meals.  Will decrease lantus to 25units daily.  She will rtc with glucometer within 2 weeks.  Her updated medication list for this problem includes:    Metformin Hcl 1000 Mg Tabs (Metformin hcl) .Marland Kitchen... Take 1 tablet by mouth two times a day    Lantus Solostar 100 Unit/ml Soln (Insulin glargine) ..... Inject 25 units lantus every day at about the same time    Novolog Penfill 100 Unit/ml Soln (Insulin aspart) ..... Inject 2 units before each meal.  Complete Medication List: 1)  Metformin Hcl 1000 Mg Tabs (Metformin hcl) .... Take 1 tablet by mouth two times a day 2)  Truetrack Blood Glucose Devi (Blood glucose monitoring suppl) .... Test blood sugar 2-4 times a day as directed 3)  Lantus Solostar 100 Unit/ml Soln (Insulin glargine) .... Inject 25 units lantus every day at about the same time 4)  Combigan 0.2-0.5 % Soln (Brimonidine tartrate-timolol) .... One drop twice daily to affected eye. 5)  Acyclovir 800 Mg Tabs (Acyclovir) .... Take 1 tablet by mouth two times a day 6)  Isopto Hyoscine 0.25 % Soln (Scopolamine hbr) 7)  Iron Supplement 325 (65 Fe) Mg Tabs (Ferrous sulfate) .... Take 1 tablet by mouth three times a day 8)  Prodigy Blood Glucose Test Strp (Glucose blood) .... Use to check blood sugar three times a day before meals 9)  Prodigy Twist Top Lancets 28g Misc (Lancets) .... Use to test blood sugars 3x a day 10)  Prodigy Insulin Syringe 31g X 5/16" 0.5 Ml Misc (Insulin syringe-needle u-100) .... Use to inject insulin once daily 11)  Novolog Penfill 100 Unit/ml Soln (Insulin aspart) .... Inject 2 units before each meal.  Other Orders: Capillary Blood Glucose/CBG ([id number])  Patient Instructions: 1)  Please schedule a follow-up appointment in 2 weeks. 2)  You have a new prescription for novolog, a short acting insulin.  You will inject 2 units  before eating each meal. 3)  Your dose of lantus has been changed to 25 units daily. 4)  Please continue to check your blood sugars three times a day and bring your meter to the next appointment. Prescriptions: NOVOLOG PENFILL 100 UNIT/ML SOLN (INSULIN ASPART) Inject 2 units before each meal.  #1 box x 3   Entered and Authorized by:   Elby Showers MD   Signed by:   Elby Showers MD  on 04/24/2009   Method used:   Electronically to        [facility]  [address] [id number] (retail)       [address]       [place], [place]  [address]       Ph: [phone]       Fax: [phone]   RxID:   [id number]   Prevention & Chronic Care Immunizations   Influenza vaccine: Fluvax 3+  (02/23/2008)    Tetanus booster: Not documented    Pneumococcal vaccine: Not documented  Other Screening   Pap smear:  Specimen Adequacy: Satisfactory for evaluation.   Interpretation/Result:Negative for intraepithelial Lesion or Malignancy.   Interpretation/Result:Fungal organisms c/w Candida present.      (08/18/2007)   Pap smear due: 08/2008   Smoking status: never  (04/24/2009)  Diabetes Mellitus   HgbA1C: 10.3  (11/10/2008)    Eye exam: No diabetic retinopathy, chronic uveitis with vision loss.     (02/09/2008)    Foot exam: yes  (04/24/2009)   High risk foot: Not documented   Foot care education: Done  (09/28/2008)    Urine microalbumin/creatinine ratio: 10.8  (11/10/2008)  Lipids   Total Cholesterol: 187  (10/03/2008)   LDL: 104  (10/03/2008)   LDL Direct: Not documented   HDL: 61  (10/03/2008)   Triglycerides: 110  (10/03/2008)    SGOT (AST): 17  (04/13/2009)   SGPT (ALT): 15  (04/13/2009)   Alkaline phosphatase: 109  (04/13/2009)   Total bilirubin: 0.3  (04/13/2009)  Self-Management Support :    Patient will work on the following items until the next clinic visit to reach self-care goals:     Medications and monitoring: take my medicines every day, check my  blood sugar, bring all of my medications to every visit, weigh myself weekly, examine my feet every day  (04/24/2009)     Eating: drink diet soda or water instead of juice or soda, eat more vegetables, use fresh or frozen vegetables, eat foods that are low in salt, eat fruit for snacks and desserts, limit or avoid alcohol  (04/24/2009)     Activity: take a 30 minute walk every day, take the stairs instead of the elevator  (04/24/2009)    Diabetes self-management support: Copy of home glucose meter record  (04/24/2009)   Last diabetes self-management training by diabetes educator: 12/16/2008   Last medical nutrition therapy: 03/07/2008    Lipid self-management support: Not documented    Last LDL:                                                 104 (10/03/2008 9:00:00 PM)        Diabetic Foot Exam Foot Inspection Is there a history of a foot ulcer?              No Is there a foot ulcer now?              No Can the patient see the bottom of their feet?          Yes Are the shoes appropriate in style and fit?          Yes Is there swelling or an abnormal foot shape?          No Are the toenails  long?                No Are the toenails thick?                No Are the toenails ingrown?              No Is there heavy callous build-up?              No Is there a claw toe deformity?                          No Is there elevated skin temperature?            No Is there limited ankle dorsiflexion?            No Is there foot or ankle muscle weakness?            No Do you have pain in calf while walking?           No         10-g (5.07) Semmes-Weinstein Monofilament Test Performed by: Stanton Kidney Ditzler RN          Right Foot          Left Foot Visual Inspection     normal         normal Test Control      normal         normal Site 1         normal         normal Site 2         normal         normal Site 3         normal         normal Site 4         normal         normal Site 5          normal         normal Site 6         normal         normal Site 7         normal         normal Site 8         normal         normal Site 9         normal         normal Site 10         normal         normal  Impression      normal         normal

## 2010-05-22 NOTE — Letter (Addendum)
Summary: BLOOD GLUCOSE REPORT   BLOOD GLUCOSE REPORT   Imported By: Margie Billet 10/03/2009 11:36:04  _____________________________________________________________________  External Attachments:     1. Type:   Image          Comment:   External Document    2. Type:   Image          Comment:   External Document

## 2010-05-22 NOTE — Assessment & Plan Note (Signed)
Summary: est- 1 month routine checkup/ch   Vital Signs:  Patient profile:   40 year old female Height:      64 inches (162.56 cm) Weight:      196.1 pounds (89.14 kg) BMI:     33.78 Temp:     97.6 degrees F (36.44 degrees C) Pulse rate:   74 / minute BP sitting:   120 / 75  (right arm) Cuff size:   regular  Vitals Entered By: Cynda Familia Duncan Dull) (Sep 04, 2009 3:13 PM) CC: routine f/u-will leave for [place] June 11th will be gone for , would like to have all her meds Is Patient Diabetic? Yes Did you bring your meter with you today? Yes Pain Assessment Patient in pain? no      Nutritional Status BMI of > 30 = obese  Have you ever been in a relationship where you felt threatened, hurt or afraid?No   Does patient need assistance? Functional Status Self care Ambulation Normal   Primary Care Provider:  Joaquin Courts  MD  CC:  routine f/u-will leave for [place] June 11th will be gone for and would like to have all her meds.  History of Present Illness: Pt is a 40 yo female w/ past med hx below here w/ her translator and she is planning on going to [place] for 3 months.  She is leaving June 11th and would like all of her prescriptions filled for the three months so she can have it when she goes.  She has no complaints.  She has been checking her sugars three times a day and brought them in with her.  She has had numerous episodes of lows in the am w/ as low as 22.  She stopped the metformin b/c she was having lows.    She saw the eye doctor at [facility] recently and was referred to another eye specialist but is insure why b/c she did not have her translator with her at that visit.   Current Medications (verified): 1)  Metformin Hcl 1000 Mg Tabs (Metformin Hcl) .... Take 1 Tablet By Mouth Two Times A Day 2)  Truetrack Blood Glucose  Devi (Blood Glucose Monitoring Suppl) .... Test Blood Sugar 2-4 Times A Day As Directed 3)  Lantus Solostar 100 Unit/ml Soln (Insulin  Glargine) .... Inject 45 Units Lantus Every Day At About The Same Time 4)  Combigan 0.2-0.5 % Soln (Brimonidine Tartrate-Timolol) .... One Drop Twice Daily To Affected Eye. 5)  Isopto Hyoscine 0.25 % Soln (Scopolamine Hbr) 6)  Iron Supplement 325 (65 Fe) Mg Tabs (Ferrous Sulfate) .... Take 1 Tablet By Mouth Three Times A Day 7)  Prodigy Blood Glucose Test  Strp (Glucose Blood) .... Use To Check Blood Sugar Three Times A Day Before Meals 8)  Prodigy Twist Top Lancets 28g  Misc (Lancets) .... Use To Test Blood Sugars 3x A Day 9)  Prodigy Insulin Syringe 31g X 5/16" 0.5 Ml Misc (Insulin Syringe-Needle U-100) .... Use To Inject Insulin Once Daily 10)  Novolog Flexpen 100 Unit/ml Soln (Insulin Aspart) .... Take As Directed. 11)  Nystatin 100000 Unit/gm Crea (Nystatin) .... Apply To Affected Area Twice A Day As Needed. 12)  Sure Comfort Pen Needles 31g X 8 Mm Misc (Insulin Pen Needle) .... Use With Insulin Pen As Directed.  Allergies (verified): 1)  ! * Pork  Past History:  Past Medical History: Last updated: 08/03/2009 Diabetes mellitus, type II, uncontrolled diagnosed 6 yrs ago w/ pregnancy Uveitis, hx of-Followed  at [facility] Abnormal Pap smear, hx of (8/05) Hyperlipidemia Chest pain, hx of (EKG with poor R-wave progression) ? hx of glaucoma Iron def anemia  Past Surgical History: Last updated: 08/18/2007 Vaginal circumcision C section  Social History: Last updated: 11/10/2008 Married, no smoking, unemployed, lives with her husband and 4 children.  [place]udaneese immigrant; Arabic-speaking only. Education level- completed high school.  Social History: Reviewed history from 11/10/2008 and no changes required. Married, no smoking, unemployed, lives with her husband and 4 children.  [place]ese immigrant; Arabic-speaking only. Education level- completed high school.  Review of Systems       as per HPI.   Physical Exam  General:  alert, oriented, appropriately groomed, no  distress. Eyes:  anicteric. Lungs:  normal respiratory effort and normal breath sounds.   Heart:  normal rate, regular rhythm, and no murmur.   Extremities:  no peripheral edema. Neurologic:  gait normal. Psych:  mood euthymic.    Impression & Recommendations:  Problem # 1:  DIABETES MELLITUS, TYPE II, UNCONTROLLED (ICD-250.02) She was supposed to be taking 45 u of lantus but stated she was taking 52, however, I think she told Lupita Leash she was taking 45.  Either way, her dose of lantus is too high so will decrease to 38 units.  She has been using novolog 4 units before dinner which would not account for these lows in the am.  All of her other values are way too high and she needs meal coverage at breakfast and lunch, but I am hesitant to add that now in the setting of these lows.  Will have her restart the metformin, decrease lantus, and f/u in 2 weeks.  If sugars are doing ok, will add meal coverage at that time.  Her updated medication list for this problem includes:    Metformin Hcl 1000 Mg Tabs (Metformin hcl) .Marland Kitchen... Take 1 tablet by mouth two times a day    Lantus Solostar 100 Unit/ml Soln (Insulin glargine) ..... Inject 38 units lantus every day at about the same time    Novolog Flexpen 100 Unit/ml Soln (Insulin aspart) .Marland Kitchen... Take 4 units before supper.  Problem # 2:  HYPERLIPIDEMIA (ICD-272.4) She is due for lipids next month but since she is going to be out of town for 3 months, will wait and get lipid panel when she returns b/c we will not be able to monitor LFT's while she is away.  Labs Reviewed: SGOT: 17 (04/13/2009)   SGPT: 15 (04/13/2009)   HDL:61 (10/03/2008), 62 (02/01/2008)  LDL:104 (10/03/2008), 131 (02/01/2008)  Chol:187 (10/03/2008), 217 (02/01/2008)  Trig:110 (10/03/2008), 122 (02/01/2008)  Problem # 3:  UVEITIS (ICD-364.3) Will get records from [facility] b/c pt is unsure of the plan.  Purnell Shoemaker has tried to arrange for a translator at her next visit.  Problem # 4:  ANEMIA,  IRON DEFICIENCY (ICD-280.9) Will f/u iron studies today to make sure she is responding.  Her updated medication list for this problem includes:    Iron Supplement 325 (65 Fe) Mg Tabs (Ferrous sulfate) .Marland Kitchen... Take 1 tablet by mouth three times a day  Orders: T-CBC No Diff ([id number]) T-Iron ([id number]) T-Iron Binding Capacity (TIBC) ([id number]) T-Ferritin ([id number])  Complete Medication List: 1)  Metformin Hcl 1000 Mg Tabs (Metformin hcl) .... Take 1 tablet by mouth two times a day 2)  Truetrack Blood Glucose Devi (Blood glucose monitoring suppl) .... Test blood sugar 2-4 times a day as directed 3)  Lantus Solostar 100 Unit/ml Soln (Insulin glargine) .Marland KitchenMarland KitchenMarland Kitchen  Inject 38 units lantus every day at about the same time 4)  Combigan 0.2-0.5 % Soln (Brimonidine tartrate-timolol) .... One drop twice daily to affected eye. 5)  Isopto Hyoscine 0.25 % Soln (Scopolamine hbr) 6)  Iron Supplement 325 (65 Fe) Mg Tabs (Ferrous sulfate) .... Take 1 tablet by mouth three times a day 7)  Prodigy Blood Glucose Test Strp (Glucose blood) .... Use to check blood sugar three times a day before meals 8)  Prodigy Twist Top Lancets 28g Misc (Lancets) .... Use to test blood sugars 3x a day 9)  Prodigy Insulin Syringe 31g X 5/16" 0.5 Ml Misc (Insulin syringe-needle u-100) .... Use to inject insulin once daily 10)  Novolog Flexpen 100 Unit/ml Soln (Insulin aspart) .... Take 4 units before supper. 11)  Nystatin 100000 Unit/gm Crea (Nystatin) .... Apply to affected area twice a day as needed. 12)  Sure Comfort Pen Needles 31g X 8 Mm Misc (Insulin pen needle) .... Use with insulin pen as directed.  Patient Instructions: 1)  Please make an appointment in 2 weeks to followup on your diabetes. 2)  Continue to check your sugars 3 times a day. 3)  Please decrease lantus insulin to 38 units at bedtime. 4)  Continue the metformin and the novolog before supper.  Prescriptions: NYSTATIN 100000 UNIT/GM CREA (NYSTATIN) Apply  to affected area twice a day as needed.  #60 g x 3   Entered and Authorized by:   Joaquin Courts  MD   Signed by:   Joaquin Courts  MD on 09/04/2009   Method used:   Electronically to        [facility] [phone]* (retail)       [address]       [place], [place]  [address]       Ph: [phone]       Fax: [phone]   RxID:   [id number] NOVOLOG FLEXPEN 100 UNIT/ML SOLN (INSULIN ASPART) Take 4 units before supper.  #3 pens x 0   Entered and Authorized by:   Joaquin Courts  MD   Signed by:   Joaquin Courts  MD on 09/04/2009   Method used:   Electronically to        [facility] [phone]* (retail)       [address]       [place], [place]  [address]       Ph: [phone]       Fax: [phone]   RxID:   [id number] PRODIGY BLOOD GLUCOSE TEST  STRP (GLUCOSE BLOOD) use to check blood sugar three times a day before meals  #3 boxes x 2   Entered and Authorized by:   Joaquin Courts  MD   Signed by:   Joaquin Courts  MD on 09/04/2009   Method used:   Electronically to        [facility] [phone]* (retail)       [address]       [place], [place]  [address]       Ph: [phone]       Fax: [phone]   RxID:   [id number] IRON SUPPLEMENT 325 (65 FE) MG TABS (FERROUS SULFATE) Take 1 tablet by mouth three times a day  #270 x 1   Entered and Authorized by:   Joaquin Courts  MD   Signed by:   Joaquin Courts  MD on 09/04/2009   Method used:   Electronically to  [facility] [address] [phone]* (retail)       [address]       [place], [place]  [address]       Ph: [phone]       Fax: [phone]   RxID:   [id number] LANTUS SOLOSTAR 100 UNIT/ML SOLN (INSULIN GLARGINE) Inject 38 units Lantus every day at about the same time  #4 bottles x 1   Entered and Authorized by:   Joaquin Courts  MD   Signed by:   Joaquin Courts  MD on 09/04/2009   Method used:   Electronically to        [facility] [phone]* (retail)       [address]        [place], [place]  [address]       Ph: [phone]       Fax: [phone]   RxID:   [id number] METFORMIN HCL 1000 MG TABS (METFORMIN HCL) Take 1 tablet by mouth two times a day  #180 x 0   Entered and Authorized by:   Joaquin Courts  MD   Signed by:   Joaquin Courts  MD on 09/04/2009   Method used:   Electronically to        [facility] [phone]* (retail)       [address]       [place], [place]  [address]       Ph: [phone]       Fax: [phone]   RxID:   [id number] NYSTATIN 100000 UNIT/GM CREA (NYSTATIN) Apply to affected area twice a day as needed.  #60 g x 3   Entered and Authorized by:   Joaquin Courts  MD   Signed by:   Joaquin Courts  MD on 09/04/2009   Method used:   Electronically to        [facility] [phone]* (retail)       [address]       [place], [place]  [address]       Ph: [phone]       Fax: [phone]   RxID:   [id number] NOVOLOG FLEXPEN 100 UNIT/ML SOLN (INSULIN ASPART) Take 4 units before supper.  #3 pens x 0   Entered and Authorized by:   Joaquin Courts  MD   Signed by:   Joaquin Courts  MD on 09/04/2009   Method used:   Electronically to        [facility] [phone]* (retail)       [address]       [place], [place]  [address]       Ph: [phone]       Fax: [phone]   RxID:   [id number] PRODIGY BLOOD GLUCOSE TEST  STRP (GLUCOSE BLOOD) use to check blood sugar three times a day before meals  #3 boxes x 2   Entered and Authorized by:   Joaquin Courts  MD   Signed by:   Joaquin Courts  MD on 09/04/2009   Method used:   Electronically to        [facility]  [address] [phone]* (retail)       [address]       [place], [place]  [address]       Ph: [phone]  Fax: [phone]   RxID:   [id number] IRON SUPPLEMENT 325 (65 FE) MG TABS (FERROUS SULFATE) Take 1 tablet by mouth three times a day  #270 x 1   Entered and Authorized by:    Joaquin Courts  MD   Signed by:   Joaquin Courts  MD on 09/04/2009   Method used:   Electronically to        [facility]  [address] [phone]* (retail)       [address]       [place], [place]  [address]       Ph: [phone]       Fax: [phone]   RxID:   [id number] LANTUS SOLOSTAR 100 UNIT/ML SOLN (INSULIN GLARGINE) Inject 38 units Lantus every day at about the same time  #4 bottles x 1   Entered and Authorized by:   Joaquin Courts  MD   Signed by:   Joaquin Courts  MD on 09/04/2009   Method used:   Electronically to        [facility] [phone]* (retail)       [address]       [place], [place]  [address]       Ph: [phone]       Fax: [phone]   RxID:   [id number] METFORMIN HCL 1000 MG TABS (METFORMIN HCL) Take 1 tablet by mouth two times a day  #180 x 0   Entered and Authorized by:   Joaquin Courts  MD   Signed by:   Joaquin Courts  MD on 09/04/2009   Method used:   Electronically to        [facility] [phone]* (retail)       [address]       [place], [place]  [address]       Ph: [phone]       Fax: [phone]   RxID:   [id number]  Process Orders Check Orders Results:     [facility]: ABN not required for this insurance Tests Sent for requisitioning (Sep 06, 2009 7:16 AM):     09/04/2009: [facility] -- T-CBC No Diff [[id number]] (signed)     09/04/2009: [facility] -- Augusto Gamble [[id number]] (signed)     09/04/2009: [facility] -- T-Iron Binding Capacity (TIBC) [[id number]] (signed)     09/04/2009: [facility] -- T-Ferritin [[id number]] (signed)    Prevention & Chronic Care Immunizations   Influenza vaccine: Fluvax 3+  (02/23/2008)   Influenza vaccine deferral: Deferred  (07/18/2009)    Tetanus booster: Not documented   Td booster deferral: Deferred  (07/18/2009)    Pneumococcal  vaccine: Not documented  Other Screening   Pap smear:  Specimen Adequacy: Satisfactory for evaluation.   Interpretation/Result:Negative for intraepithelial Lesion or Malignancy.     (01/26/2009)   Pap smear due: 01/2010   Smoking status: never  (08/03/2009)  Diabetes Mellitus   HgbA1C: 13.4  (07/18/2009)    Eye exam: No diabetic retinopathy, chronic uveitis with vision loss.     (02/09/2008)   Diabetic eye exam action/deferral: Ophthalmology referral  (08/03/2009)    Foot exam: yes  (04/24/2009)   High risk foot: Not documented   Foot care education: Done  (09/28/2008)    Urine microalbumin/creatinine ratio: 10.8  (11/10/2008)  Diabetes flowsheet reviewed?: Yes   Progress toward A1C goal: Unchanged  Lipids   Total Cholesterol: 187  (10/03/2008)   LDL: 104  (10/03/2008)   LDL Direct: Not documented   HDL: 61  (10/03/2008)   Triglycerides: 110  (10/03/2008)    SGOT (AST): 17  (04/13/2009)   SGPT (ALT): 15  (04/13/2009)   Alkaline phosphatase: 109  (04/13/2009)   Total bilirubin: 0.3  (04/13/2009)    Lipid flowsheet reviewed?: Yes   Progress toward LDL goal: Unchanged  Self-Management Support :   Personal Goals (by the next clinic visit) :     Personal A1C goal: 7  (08/03/2009)     Personal blood pressure goal: 130/80  (08/03/2009)     Personal LDL goal: 100  (08/03/2009)    Diabetes self-management support: Education handout, Resources for patients handout, Written self-care plan  (08/03/2009)   Last diabetes self-management training by diabetes educator: 04/24/2009   Last medical nutrition therapy: 03/07/2008    Lipid self-management support: Education handout, Resources for patients handout, Written self-care plan  (08/03/2009)

## 2010-05-22 NOTE — Assessment & Plan Note (Signed)
Summary: dm teaching per wilson/ch  Is Patient Diabetic? Yes Did you bring your meter with you today? Yes CBG Result 264 CBG Device ID Prodigy Pocket Comments CBG is  ~ 2 hour post prandial. had patient self test bl;ood sugar in office to check technique. Her technique was acceptable.    Allergies: 1)  ! * Pork   Complete Medication List: 1)  Metformin Hcl 1000 Mg Tabs (Metformin hcl) .... Take 1 tablet by mouth two times a day 2)  Truetrack Blood Glucose Devi (Blood glucose monitoring suppl) .... Test blood sugar 2-4 times a day as directed 3)  Lantus Solostar 100 Unit/ml Soln (Insulin glargine) .... Inject 45 units lantus every day at about the same time 4)  Combigan 0.2-0.5 % Soln (Brimonidine tartrate-timolol) .... One drop twice daily to affected eye. 5)  Isopto Hyoscine 0.25 % Soln (Scopolamine hbr) 6)  Iron Supplement 325 (65 Fe) Mg Tabs (Ferrous sulfate) .... Take 1 tablet by mouth three times a day 7)  Prodigy Blood Glucose Test Strp (Glucose blood) .... Use to check blood sugar three times a day before meals 8)  Prodigy Twist Top Lancets 28g Misc (Lancets) .... Use to test blood sugars 3x a day 9)  Prodigy Insulin Syringe 31g X 5/16" 0.5 Ml Misc (Insulin syringe-needle u-100) .... Use to inject insulin once daily 10)  Novolog Flexpen 100 Unit/ml Soln (Insulin aspart) .... Take as directed. 11)  Nystatin 100000 Unit/gm Crea (Nystatin) .... Apply to affected area twice a day as needed. 12)  Sure Comfort Pen Needles 31g X 8 Mm Misc (Insulin pen needle) .... Use with insulin pen as directed.  Other Orders: DSMT (Medicaid) 60 Minutes [phone])  Diabetes Self Management Training  PCP: Joaquin Courts  MD Referring MD: wilson Date diagnosed with diabetes: 04/22/1998 Diabetes Type: Type 2 insulin treated Other persons present: Interpreter Current smoking Status: never  Diabetes Medications:  Comments: reports she takes lantus at 8pm in one leg, novolog before or 10 minutes  after dinner ( often around same time as lantus) in the other leg, stopped metformin when she started tkaing the Novolog.Patient report changes often, not sure if this is because of translation, but there seems ot be some discrenpency about what she is doing and taking at home. Discussed with DR. Wilson today. Emphasized with patient today importance of Novolog insulin being before meals and matching food intake to preent high and low blood sugars.  Current Insulin Use   Rapid/Short Dinner Dose: 4 units  Long Acting  Bedtime Dose:  units    Monitoring Self monitoring blood glucose 3 times a day Name of Meter  Prodigy Pocket Measures urine ketones? No  Recent Episodes of: Requiring Help from another person  Hypoglycemia: Yes Severe Hypoglycemia : Yes   Wears Medical I.D. No   Estimated /Usual Carb Intake Breakfast # of Carbs/Grams tea with artificial sweetner and milk, small sweet roll Midmorning # of Carbs/Grams egg, small pita water Midafternoon # of Carbs/Grams salad with lemon, water, arabic bread(large piece) meat when children come home from school  Nutrition assessment ETOH : No Biggest challenge to eating healthy: Imbalance CHO intake with insulin Diabetes Disease Process  Discussed today  Medications State name-action-dose-duration-side effects-and time to take medication: Demonstrates competency    Nutritional Management State changes planned for home meals/snacks: Needs review/assistance    Monitoring  Complications State the causes- signs and symptoms and prevention of hypoglycemia: Needs review/assistance   Explain proper treatment of hypoglycemia: Demonstrates competency  Exercise Diabetes Management Education Done: 09/04/2009    BEHAVIORAL GOALS INITIAL Preventing,detecting and treating complications: know the symptoms and always carry treatment for low blood sugar        Had about 5-6 documented episodes fo hypoglycemia int he past  month- most early morning. Calculated total insulin dose  ~ 43-70 units a day - basal insulin should be approximately 50% to  at most 60% of that. Suspect basal insulin is too much for her. She verbalized oertreatment of hypoglycemia with  ~ 203 servings juice and did not follow-up with a blood sugar check. Counseled on traveling with diabetes as she is goign to [place] for 3 months.   Diabetes Self Management Support: husband, interpreter  and clinic staff Follow- up: 4 months

## 2010-05-22 NOTE — Letter (Signed)
Summary: REFERRAL FORM  REFERRAL FORM   Imported By: Margie Billet 07/20/2009 15:52:48  _____________________________________________________________________  External Attachment:    Type:   Image     Comment:   External Document

## 2010-05-22 NOTE — Progress Notes (Signed)
Summary: refill/gg  Phone Note Refill Request  on January 25, 2010 3:22 PM  Refills Requested: Medication #1:  COMBIGAN 0.2-0.5 % SOLN One drop twice daily to affected eye.  Method Requested: Electronic Initial call taken by: Merrie Roof RN,  January 25, 2010 3:29 PM  Follow-up for Phone Call        Rx completed in Dr. Tiajuana Amass Follow-up by: Deatra Robinson MD,  January 25, 2010 10:21 PM    Prescriptions: COMBIGAN 0.2-0.5 % SOLN (BRIMONIDINE TARTRATE-TIMOLOL) One drop twice daily to affected eye.  #1 bottle x 11   Entered and Authorized by:   Deatra Robinson MD   Signed by:   Deatra Robinson MD on 01/25/2010   Method used:   Electronically to        [facility] [phone]* (retail)       [address]       [place], [place]  [address]       Ph: [phone]       Fax: [phone]   RxID:   [id number]

## 2010-05-22 NOTE — Consult Note (Signed)
Summary: [facility]: Medical  Exam   [facility]: Medical  Exam   Imported By: Florinda Marker 04/26/2009 16:45:36  _____________________________________________________________________  External Attachment:    Type:   Image     Comment:   External Document

## 2010-05-22 NOTE — Letter (Signed)
Summary: [facility]  [facility]   Imported By: Shon Hough 12/26/2009 14:06:33  _____________________________________________________________________  External Attachment:    Type:   Image     Comment:   External Document

## 2010-05-22 NOTE — Assessment & Plan Note (Signed)
Summary: FU VISIT/DS-CARE MGT CALLED/DS   Vital Signs:  Patient profile:   40 year old female Height:      64 inches (162.56 cm) Weight:      191.0 pounds (86.82 kg) BMI:     32.90 Temp:     98.4 degrees F (36.89 degrees C) oral Pulse rate:   83 / minute BP sitting:   130 / 81  (right arm)  Vitals Entered By: Stanton Kidney Ditzler RN (July 18, 2009 11:52 AM) Is Patient Diabetic? Yes Did you bring your meter with you today? Yes Pain Assessment Patient in pain? no      Nutritional Status BMI of 25 - 29 = overweight Nutritional Status Detail appetite good CBG Result 422  Have you ever been in a relationship where you felt threatened, hurt or afraid?denies   Does patient need assistance? Functional Status Self care Ambulation Normal Comments Interpreters with pt. Rash in groin area - comes and goes - cream does not help. Refill on eye drops and other meds.    Primary Care Provider:  Joaquin Courts  MD   History of Present Illness: Pt is a 40 yo female w/ past med hx below accompanied by her interpreter here for evaluation of bilateral groin rash.  Was started on cream in the past that worked but then it recurs.  Her husband has the same rash.  She notes her blood sugar is very high today b/c she forgot to take her insulin today.  She has her meter w/ her today and all of her blood sugars are all > than 250.  Also, she needs refills on her meds.  She has not ben taking her metformin b/c she hasn't gotton it filled.  She has also not beent aking her iron supplements.   Depression History:      The patient denies a depressed mood most of the day and a diminished interest in her usual daily activities.         Preventive Screening-Counseling & Management  Alcohol-Tobacco     Alcohol drinks/day: 0     Smoking Status: never     Smoking Cessation Counseling: yes  Caffeine-Diet-Exercise     Caffeine use/day: 1 expresso a day     Does Patient Exercise: no     Type of exercise:  WALKING     Times/week: 3-4  Current Medications (verified): 1)  Metformin Hcl 1000 Mg Tabs (Metformin Hcl) .... Take 1 Tablet By Mouth Two Times A Day 2)  Truetrack Blood Glucose  Devi (Blood Glucose Monitoring Suppl) .... Test Blood Sugar 2-4 Times A Day As Directed 3)  Lantus Solostar 100 Unit/ml Soln (Insulin Glargine) .... Inject 32 Units Lantus Every Day At About The Same Time 4)  Combigan 0.2-0.5 % Soln (Brimonidine Tartrate-Timolol) .... One Drop Twice Daily To Affected Eye. 5)  Isopto Hyoscine 0.25 % Soln (Scopolamine Hbr) 6)  Iron Supplement 325 (65 Fe) Mg Tabs (Ferrous Sulfate) .... Take 1 Tablet By Mouth Three Times A Day 7)  Prodigy Blood Glucose Test  Strp (Glucose Blood) .... Use To Check Blood Sugar Three Times A Day Before Meals 8)  Prodigy Twist Top Lancets 28g  Misc (Lancets) .... Use To Test Blood Sugars 3x A Day 9)  Prodigy Insulin Syringe 31g X 5/16" 0.5 Ml Misc (Insulin Syringe-Needle U-100) .... Use To Inject Insulin Once Daily 10)  Novolog Flexpen 100 Unit/ml Soln (Insulin Aspart) .... Take As Directed. 11)  Nystatin-Triamcinolone 100000-0.1 Unit/gm-% Crea (Nystatin-Triamcinolone) .... Use  On Affected Area Two Times A Day. 12)  Sure Comfort Pen Needles 31g X 8 Mm Misc (Insulin Pen Needle) .... Use With Insulin Pen As Directed.  Allergies: 1)  ! * Pork  Past History:  Past Medical History: Last updated: 07/28/2007 Diabetes mellitus, type II, uncontrolled diagnosed 6 yrs ago w/ pregnancy Uveitis, hx of Abnormal Pap smear, hx of (8/05) Hyperlipidemia Chest pain, hx of (EKG with poor R-wave progression) ? hx of glaucoma  Past Surgical History: Last updated: 08/18/2007 Vaginal circumcision C section  Social History: Last updated: 11/10/2008 Married, no smoking, unemployed, lives with her husband and 4 children.  [place] immigrant; Arabic-speaking only. Education level- completed high school.  Social History: Reviewed history from 11/10/2008 and no  changes required. Married, no smoking, unemployed, lives with her husband and 4 children.  [place] immigrant; Arabic-speaking only. Education level- completed high school.  Review of Systems       as per hpi.  Physical Exam  General:  alert, oriented, no distress.  Eyes:  anicteric.  Mouth:  MMM, no thrush.  Lungs:  normal respiratory effort and normal breath sounds.   Heart:  normal rate, regular rhythm, and no murmur.   Abdomen:  soft, non-tender, normal bowel sounds, and no distention.   Extremities:  no edema Neurologic:  gait normal.  Skin:  candida intertrigo on inner thighs Psych:  mood euthymic.    Impression & Recommendations:  Problem # 1:  CANDIDIASIS, SKIN (ICD-112.3) Provided her a larger tube of nystatin ointment to use as needed.  Problem # 2:  DIABETES MELLITUS, TYPE II, UNCONTROLLED (ICD-250.02) Difficult to control.  All her values are elevated, inadvertently stopped metformin.  Advised her to restart and slightly increased her insulin and f/u in two weeks to see how this adjustment is going.  Reminded her to bring her meter.  CBG very elevated but she is asymptomatic.  F/u bicarb today.  Her updated medication list for this problem includes:    Metformin Hcl 1000 Mg Tabs (Metformin hcl) .Marland Kitchen... Take 1 tablet by mouth two times a day    Lantus Solostar 100 Unit/ml Soln (Insulin glargine) ..... Inject 40 units lantus every day at about the same time    Novolog Flexpen 100 Unit/ml Soln (Insulin aspart) .Marland Kitchen... Take as directed.  Orders: T- Capillary Blood Glucose (86578) T-Hgb A1C (in-house) (46962XB) T-Basic Metabolic Panel ([id number])  Labs Reviewed: Creat: 0.60 (04/13/2009)     Last Eye Exam: No diabetic retinopathy, chronic uveitis with vision loss.    (02/09/2008) Reviewed HgBA1c results: 13.4 (07/18/2009)  12.9 (04/19/2009)  Problem # 3:  ANEMIA, IRON DEFICIENCY (ICD-280.9) Has not been taking iron.  Reminded her to start and will f/u iron  studies, hgb today and f/u repeat levels in 4-6 weeks to see response.  Her updated medication list for this problem includes:    Iron Supplement 325 (65 Fe) Mg Tabs (Ferrous sulfate) .Marland Kitchen... Take 1 tablet by mouth three times a day  Orders: T-CBC No Diff ([id number]) T-Iron ([id number]) T-Iron Binding Capacity (TIBC) ([id number]) T-Ferritin ([id number])  Problem # 4:  GLAUCOMA ASSOCIATED WITH OCULAR INFLAMMATIONS (ICD-365.62) Refilled eye drops but she needs to stay in close contact w/ her opthamologist b/c of her multiple ocular issues, and I have reminded her of this.   Complete Medication List: 1)  Metformin Hcl 1000 Mg Tabs (Metformin hcl) .... Take 1 tablet by mouth two times a day 2)  Truetrack Blood Glucose Devi (Blood glucose monitoring suppl) .... Test blood sugar  2-4 times a day as directed 3)  Lantus Solostar 100 Unit/ml Soln (Insulin glargine) .... Inject 40 units lantus every day at about the same time 4)  Combigan 0.2-0.5 % Soln (Brimonidine tartrate-timolol) .... One drop twice daily to affected eye. 5)  Isopto Hyoscine 0.25 % Soln (Scopolamine hbr) 6)  Iron Supplement 325 (65 Fe) Mg Tabs (Ferrous sulfate) .... Take 1 tablet by mouth three times a day 7)  Prodigy Blood Glucose Test Strp (Glucose blood) .... Use to check blood sugar three times a day before meals 8)  Prodigy Twist Top Lancets 28g Misc (Lancets) .... Use to test blood sugars 3x a day 9)  Prodigy Insulin Syringe 31g X 5/16" 0.5 Ml Misc (Insulin syringe-needle u-100) .... Use to inject insulin once daily 10)  Novolog Flexpen 100 Unit/ml Soln (Insulin aspart) .... Take as directed. 11)  Nystatin 100000 Unit/gm Crea (Nystatin) .... Apply to affected area twice a day as needed. 12)  Sure Comfort Pen Needles 31g X 8 Mm Misc (Insulin pen needle) .... Use with insulin pen as directed.  Patient Instructions: 1)  Please make a followup appointment in 2 weeks for a check up on your diabetes. 2)  Please start taking  the metformin and iron. 3)  Please bring your meter and check your sugars at least 2 times a day until your next appointment. 4)  Increase lantus to 40 units once a day. Prescriptions: NYSTATIN 100000 UNIT/GM CREA (NYSTATIN) Apply to affected area twice a day as needed.  #60 g x 3   Entered and Authorized by:   Joaquin Courts  MD   Signed by:   Joaquin Courts  MD on 07/18/2009   Method used:   Electronically to        [facility]  [address] [phone][facility] (retail)       [address]       [place], [place]  [address]       Ph: [phone]       Fax: [phone]   RxID:   [id number] IRON SUPPLEMENT 325 (65 FE) MG TABS (FERROUS SULFATE) Take 1 tablet by mouth three times a day  #90 x 3   Entered and Authorized by:   Joaquin Courts  MD   Signed by:   Joaquin Courts  MD on 07/18/2009   Method used:   Electronically to        [facility]  [address] [phone]* (retail)       [address]       [place], [place]  [address]       Ph: [phone]       Fax: [phone]   RxID:   [id number] COMBIGAN 0.2-0.5 % SOLN (BRIMONIDINE TARTRATE-TIMOLOL) One drop twice daily to affected eye.  #1 bottle x 6   Entered and Authorized by:   Joaquin Courts  MD   Signed by:   Joaquin Courts  MD on 07/18/2009   Method used:   [facility] to        [facility]  [address] [phone][facility] (retail)       [address]       [place], [place]  [address]       Ph: [phone]       Fax: [phone]   RxID:   [id number]  Process Orders Check Orders Results:  [facility]: ABN not required for this insurance Tests Sent for requisitioning (July 20, 2009 9:06 AM):     07/18/2009: [facility] -- T-Basic Metabolic Panel [id number] (signed)     07/18/2009: [facility] -- T-CBC No Diff [[id number]] (signed)     07/18/2009: [facility] -- Augusto Gamble [[id number]] (signed)     07/18/2009:  [facility] -- T-Iron Binding Capacity (TIBC) [[id number]] (signed)     07/18/2009: [facility] -- T-Ferritin [[id number]] (signed)    Prevention & Chronic Care Immunizations   Influenza vaccine: Fluvax 3+  (02/23/2008)   Influenza vaccine deferral: Deferred  (07/18/2009)    Tetanus booster: Not documented   Td booster deferral: Deferred  (07/18/2009)    Pneumococcal vaccine: Not documented  Other Screening   Pap smear:  Specimen Adequacy: Satisfactory for evaluation.   Interpretation/Result:Negative for intraepithelial Lesion or Malignancy.     (01/26/2009)   Pap smear due: 01/2010   Smoking status: never  (07/18/2009)  Diabetes Mellitus   HgbA1C: 13.4  (07/18/2009)    Eye exam: No diabetic retinopathy, chronic uveitis with vision loss.     (02/09/2008)   Diabetic eye exam action/deferral: Ophthalmology referral  (05/09/2009)    Foot exam: yes  (04/24/2009)   High risk foot: Not documented   Foot care education: Done  (09/28/2008)    Urine microalbumin/creatinine ratio: 10.8  (11/10/2008)    Diabetes flowsheet reviewed?: Yes   Progress toward A1C goal: At goal  Lipids   Total Cholesterol: 187  (10/03/2008)   LDL: 104  (10/03/2008)   LDL Direct: Not documented   HDL: 61  (10/03/2008)   Triglycerides: 110  (10/03/2008)    SGOT (AST): 17  (04/13/2009)   SGPT (ALT): 15  (04/13/2009)   Alkaline phosphatase: 109  (04/13/2009)   Total bilirubin: 0.3  (04/13/2009)    Lipid flowsheet reviewed?: Yes   Progress toward LDL goal: At goal  Self-Management Support :    Patient will work on the following items until the next clinic visit to reach self-care goals:     Medications and monitoring: take my medicines every day, check my blood sugar, bring all of my medications to every visit, examine my feet every day  (07/18/2009)     Eating: drink diet soda or water instead of juice or soda, eat more vegetables, use fresh or frozen vegetables,  eat foods that are low in salt, eat fruit for snacks and desserts, limit or avoid alcohol  (07/18/2009)     Activity: take a 30 minute walk every day  (07/18/2009)    Diabetes self-management support: Copy of home glucose meter record, CBG self-monitoring log, Written self-care plan, Education handout, Resources for patients handout  (07/18/2009)   Diabetes care plan printed   Diabetes education handout printed   Last diabetes self-management training by diabetes educator: 04/24/2009   Last medical nutrition therapy: 03/07/2008    Lipid self-management support: Written self-care plan, Education handout, Resources for patients handout  (07/18/2009)   Lipid self-care plan printed.   Lipid education handout printed      Resource handout printed.  Laboratory Results   Blood Tests   Date/Time Received: July 18, 2009 12:05 PM  Date/Time Reported: Burke Keels  July 18, 2009 12:05 PM   HGBA1C: 13.4%   (Normal Range: Non-Diabetic - 3-6%   Control Diabetic - 6-8%) CBG Random:: 422mg /dL

## 2010-05-22 NOTE — Letter (Signed)
Summary: BLOOD GLUCOSE REPORT  BLOOD GLUCOSE REPORT   Imported By: Margie Billet 03/21/2010 10:28:25  _____________________________________________________________________  External Attachment:    Type:   Image     Comment:   External Document

## 2010-05-22 NOTE — Assessment & Plan Note (Signed)
Summary: EST-4 WEEK RECHECK/CH   Vital Signs:  Patient profile:   40 year old female Height:      64 inches (162.56 cm) Weight:      193.4 pounds (87.91 kg) BMI:     33.32 Temp:     97.2 degrees F oral Pulse rate:   74 / minute BP sitting:   123 / 78  (right arm) Cuff size:   large  Vitals Entered By: Chinita Pester RN (March 20, 2010 3:23 PM) CC: F/U visit.  Urinary burning no itching. Is Patient Diabetic? Yes Did you bring your meter with you today? Yes Nutritional Status BMI of > 30 = obese CBG Result 116  Have you ever been in a relationship where you felt threatened, hurt or afraid?No   Does patient need assistance? Functional Status Self care Ambulation Normal Comments Interpreter w/pt.   Primary Care Provider:  Joaquin Courts  MD  CC:  F/U visit.  Urinary burning no itching.Marland Kitchen  History of Present Illness: 1. vaginal itching and "burning" for 1 week. 2. DM. Patient checks her CBG --range between 104 -280. Patient administers Novolog twice a day after she eats (4 units) and 32 Units of Lantus at bedtime. patient reprots having mild hypoglycemic events, especially when is not consistent with her meal times. Sometimes takes dinner as late as 11 pm at night.  Depression History:      The patient denies a depressed mood most of the day and a diminished interest in her usual daily activities.         Preventive Screening-Counseling & Management  Alcohol-Tobacco     Alcohol drinks/day: 0     Smoking Status: never     Smoking Cessation Counseling: yes  Caffeine-Diet-Exercise     Caffeine use/day: 1 expresso a day     Does Patient Exercise: yes     Type of exercise: WALKING     Times/week: sometimes  Medications Prior to Update: 1)  Metformin Hcl 1000 Mg Tabs (Metformin Hcl) .... Take 1 Tablet By Mouth Two Times A Day 2)  Lantus Solostar 100 Unit/ml Soln (Insulin Glargine) .... Inject 38 Units Lantus Every Day At About The Same Time 3)  Combigan 0.2-0.5 % Soln  (Brimonidine Tartrate-Timolol) .... One Drop Twice Daily To Affected Eye. 4)  Iron Supplement 325 (65 Fe) Mg Tabs (Ferrous Sulfate) .... Take 1 Tablet By Mouth Three Times A Day 5)  Prodigy Blood Glucose Test  Strp (Glucose Blood) .... Use To Check Blood Sugar Three Times A Day Before Meals 6)  Prodigy Twist Top Lancets 28g  Misc (Lancets) .... Use To Test Blood Sugars 3x A Day 7)  Prodigy Insulin Syringe 31g X 5/16" 0.5 Ml Misc (Insulin Syringe-Needle U-100) .... Use To Inject Insulin Once Daily 8)  Novolog Flexpen 100 Unit/ml Soln (Insulin Aspart) .... Take 4 Units Before Breakfast, Lunch, and Dinner. 9)  Nystatin 100000 Unit/gm Crea (Nystatin) .... Apply To Affected Area Twice A Day As Needed. 10)  Sure Comfort Pen Needles 31g X 8 Mm Misc (Insulin Pen Needle) .... Use With Insulin Pen As Directed. 11)  Pred Forte 1 % Susp (Prednisolone Acetate) 12)  Atropine Sulfate 1 % Soln (Atropine Sulfate) 13)  Prodigy Pocket Blood Glucose W/device Kit (Blood Glucose Monitoring Suppl) .... Check Your Blood Sugar Every Morning and Before Dinner Daily 14)  Prodigy No Coding Blood Gluc  Strp (Glucose Blood) .... Check Your Blood Glucose Twice A Day Before Meals 15)  Capsaicin 0.075 % Crea (  Capsaicin) .... Apply Small Amount of Cream To Both Feet As Needed For Numbness and Pain 16)  Lisinopril 10 Mg Tabs (Lisinopril) .... Take One Tablet By Mouth Daily  Current Medications (verified): 1)  Metformin Hcl 1000 Mg Tabs (Metformin Hcl) .... Take 1 Tablet By Mouth Two Times A Day 2)  Lantus Solostar 100 Unit/ml Soln (Insulin Glargine) .... Inject 32units Lantus Every Day At About The Same Time 3)  Combigan 0.2-0.5 % Soln (Brimonidine Tartrate-Timolol) .... One Drop Twice Daily To Affected Eye. 4)  Iron Supplement 325 (65 Fe) Mg Tabs (Ferrous Sulfate) .... Take 1 Tablet By Mouth Three Times A Day 5)  Prodigy Blood Glucose Test  Strp (Glucose Blood) .... Use To Check Blood Sugar Three Times A Day Before Meals 6)   Prodigy Twist Top Lancets 28g  Misc (Lancets) .... Use To Test Blood Sugars 3x A Day 7)  Prodigy Insulin Syringe 31g X 5/16" 0.5 Ml Misc (Insulin Syringe-Needle U-100) .... Use To Inject Insulin Once Daily 8)  Novolog Flexpen 100 Unit/ml Soln (Insulin Aspart) .... Take 4 Units Before Breakfast, Lunch, and Dinner. 9)  Nystatin 100000 Unit/gm Crea (Nystatin) .... Apply To Affected Area Twice A Day As Needed. 10)  Sure Comfort Pen Needles 31g X 8 Mm Misc (Insulin Pen Needle) .... Use With Insulin Pen As Directed. 11)  Pred Forte 1 % Susp (Prednisolone Acetate) 12)  Atropine Sulfate 1 % Soln (Atropine Sulfate) 13)  Prodigy Pocket Blood Glucose W/device Kit (Blood Glucose Monitoring Suppl) .... Check Your Blood Sugar Every Morning and Before Dinner Daily 14)  Prodigy No Coding Blood Gluc  Strp (Glucose Blood) .... Check Your Blood Glucose Twice A Day Before Meals 15)  Capsaicin 0.075 % Crea (Capsaicin) .... Apply Small Amount of Cream To Both Feet As Needed For Numbness and Pain 16)  Lisinopril 10 Mg Tabs (Lisinopril) .... Take One Tablet By Mouth Daily 17)  Fluconazole 150 Mg Tabs (Fluconazole) .... Take One Tablet By Mouth Times One. May Repeat If No Resolution of Symptoms.  Allergies (verified): 1)  ! * Pork  Past History:  Past Medical History: Last updated: 08/03/2009 Diabetes mellitus, type II, uncontrolled diagnosed 6 yrs ago w/ pregnancy Uveitis, hx of-Followed at [facility] Abnormal Pap smear, hx of (8/05) Hyperlipidemia Chest pain, hx of (EKG with poor R-wave progression) ? hx of glaucoma Iron def anemia  Risk Factors: Alcohol Use: 0 (03/20/2010) Caffeine Use: 1 expresso a day (03/20/2010) Exercise: yes (03/20/2010)  Social History: Does Patient Exercise:  yes  Review of Systems       per HPI  Physical Exam  General:  alert, pleasant, no acute distress.  Neck:  supple and no masses.   Lungs:  normal respiratory effort and normal breath sounds.   Heart:  normal rate,  regular rhythm, and no murmur.   Abdomen:  soft, non-tender, normal bowel sounds, no distention, no guarding, and no hepatomegaly.   Genitalia:  circumcised clitoris; vaginal canal is dry and irritated. no CMT; small amount of discharge with fishy odor present. Msk:  normal ROM.    Pulses:  2+ Extremities:  no edema.  Neurologic:  alert & oriented X3 and gait normal.   Skin:  turgor normal and no suspicious lesions.   Cervical Nodes:  no anterior cervical adenopathy and no posterior cervical adenopathy.   Psych:  Oriented X3, memory intact for recent and remote, and good eye contact.     Impression & Recommendations:  Problem # 1:  DYSURIA (  ICD-788.1) Likiely due to a vaginitis -?BV vs moniliosises vs both? empiric treatment with flagyl and diflucan. Will follow up on wet prep and Gc/Chlamydia. Pelvic rest until then. Orders: T-Urinalysis Dipstick only (04540JW) T-Wet Prep by Molecular Probe [phone]) T-GC Probe, genital [phone]) T-Chlamydia Probe, genital [phone])  Encouraged to push clear liquids, get enough rest, and take acetaminophen as needed. To be seen in 10 days if no improvement, sooner if worse.  Complete Medication List: 1)  Metformin Hcl 1000 Mg Tabs (Metformin hcl) .... Take 1 tablet by mouth two times a day 2)  Lantus Solostar 100 Unit/ml Soln (Insulin glargine) .... Inject 32units lantus every day at about the same time 3)  Combigan 0.2-0.5 % Soln (Brimonidine tartrate-timolol) .... One drop twice daily to affected eye. 4)  Iron Supplement 325 (65 Fe) Mg Tabs (Ferrous sulfate) .... Take 1 tablet by mouth three times a day 5)  Prodigy Blood Glucose Test Strp (Glucose blood) .... Use to check blood sugar three times a day before meals 6)  Prodigy Twist Top Lancets 28g Misc (Lancets) .... Use to test blood sugars 3x a day 7)  Prodigy Insulin Syringe 31g X 5/16" 0.5 Ml Misc (Insulin syringe-needle u-100) .... Use to inject insulin once daily 8)  Novolog  Flexpen 100 Unit/ml Soln (Insulin aspart) .... Take 4 units before breakfast, lunch, and dinner. 9)  Nystatin 100000 Unit/gm Crea (Nystatin) .... Apply to affected area twice a day as needed. 10)  Sure Comfort Pen Needles 31g X 8 Mm Misc (Insulin pen needle) .... Use with insulin pen as directed. 11)  Pred Forte 1 % Susp (Prednisolone acetate) 12)  Atropine Sulfate 1 % Soln (Atropine sulfate) 13)  Prodigy Pocket Blood Glucose W/device Kit (Blood glucose monitoring suppl) .... Check your blood sugar every morning and before dinner daily 14)  Prodigy No Coding Blood Gluc Strp (Glucose blood) .... Check your blood glucose twice a day before meals 15)  Capsaicin 0.075 % Crea (Capsaicin) .... Apply small amount of cream to both feet as needed for numbness and pain 16)  Lisinopril 10 Mg Tabs (Lisinopril) .... Take one tablet by mouth daily 17)  Fluconazole 150 Mg Tabs (Fluconazole) .... Take one tablet by mouth times one. may repeat if no resolution of symptoms. 18)  Metronidazole 0.75 % Crea (Metronidazole) .... Apply intravaginally at bedtime for 7 days.  Other Orders: Capillary Blood Glucose/CBG (13244) T-Hgb A1C (in-house) (01027OZ)  Patient Instructions: 1)  Please, pick up a vaginal cream and an oral medication at your pharmacy. Please, sustain from a sexual activity for 1 week. 2)  Call with  any questions or if you feel worse. 3)  Follow up appointment in 6 -8 weeks. Prescriptions: METRONIDAZOLE 0.75 % CREA (METRONIDAZOLE) Apply intravaginally at bedtime for 7 days.  #1 tube x 0   Entered and Authorized by:   Deatra Robinson MD   Signed by:   Deatra Robinson MD on 03/20/2010   Method used:   Electronically to        [facility] [phone]* (retail)       [address]       [place], [place]  [address]       Ph: [phone]       Fax: [phone]   RxID:   [id number] FLUCONAZOLE 150 MG TABS (FLUCONAZOLE) Take one tablet by mouth times one. May repeat if no resolution of  symptoms.  #3 x 0   Entered and Authorized by:   Deatra Robinson  MD   Signed by:   Deatra Robinson MD on 03/20/2010   Method used:   Electronically to        [facility] [phone]* (retail)       [address]       [place], [place]  [address]       Ph: [phone]       Fax: [phone]   RxID:   [id number] NYSTATIN 100000 UNIT/GM CREA (NYSTATIN) Apply to affected area twice a day as needed.  #60 g x 3   Entered and Authorized by:   Deatra Robinson MD   Signed by:   Deatra Robinson MD on 03/20/2010   Method used:   Electronically to        [facility] [phone]* (retail)       [address]       [place], [place]  [address]       Ph: [phone]       Fax: [phone]   RxID:   [id number] METRONIDAZOLE 0.75 % CREA (METRONIDAZOLE) Apply intravaginally at bedtime for 7 days.  #1 tube x 0   Entered and Authorized by:   Deatra Robinson MD   Signed by:   Deatra Robinson MD on 03/20/2010   Method used:   Electronically to        [facility]  [address] [phone]* (retail)       [address]       [place], [place]  [address]       Ph: [phone]       Fax: [phone]   RxID:   [id number] FLUCONAZOLE 150 MG TABS (FLUCONAZOLE) Take one tablet by mouth times one. May repeat if no resolution of symptoms.  #3 x 0   Entered and Authorized by:   Deatra Robinson MD   Signed by:   Deatra Robinson MD on 03/20/2010   Method used:   Electronically to        [facility]  [address] [phone]* (retail)       [address]       [place], [place]  [address]       Ph: [phone]       Fax: [phone]   RxID:   [id number]    Orders Added: 1)  Capillary Blood Glucose/CBG [82948] 2)  T-Hgb A1C (in-house) [31540GQ] 3)  T-Urinalysis Dipstick only [81003QW] 4)  Est. Patient Level III [67619] 5)  T-Wet Prep by Molecular Probe [[id number]] 6)  T-GC Probe, genital [[id number]] 7)  T-Chlamydia Probe, genital  [[id number]]    Prevention & Chronic Care Immunizations   Influenza vaccine: Fluvax 3+  (01/31/2010)   Influenza vaccine deferral: Deferred  (07/18/2009)   Influenza vaccine due: 12/22/2010    Tetanus booster: 01/31/2010: Tdap   Td booster deferral: Deferred  (07/18/2009)   Tetanus booster due: 02/01/2020    Pneumococcal vaccine: Not documented  Other Screening   Pap smear:  Specimen Adequacy: Satisfactory for evaluation.   Interpretation/Result:Negative for intraepithelial Lesion or Malignancy.     (01/26/2009)   Pap smear due: 01/27/2012   Smoking status: never  (03/20/2010)  Diabetes Mellitus   HgbA1C: 10.3  (01/31/2010)   Hemoglobin A1C due: 05/03/2010    Eye exam: appointment with Dr. Lottie Dawson at [facility] 09/13/09  (08/29/2009)   Diabetic eye exam action/deferral: Ophthalmology referral  (08/03/2009)   Eye exam due: 09/2009  Foot exam: yes  (04/24/2009)   High risk foot: Not documented   Foot care education: Done  (09/28/2008)   Foot exam due: 04/24/2010    Urine microalbumin/creatinine ratio: 22.8  (01/31/2010)   Urine microalbumin action/deferral: Ordered   Urine microalbumin/cr due: 02/01/2011  Lipids   Total Cholesterol: 173  (02/06/2010)   Lipid panel action/deferral: Lipid Panel ordered   LDL: 103  (02/06/2010)   LDL Direct: Not documented   HDL: 57  (02/06/2010)   Triglycerides: 63  (02/06/2010)   Lipid panel due: 02/01/2011    SGOT (AST): 17  (04/13/2009)   SGPT (ALT): 15  (04/13/2009)   Alkaline phosphatase: 109  (04/13/2009)   Total bilirubin: 0.3  (04/13/2009)   Liver panel due: 08/02/2010  Self-Management Support :   Personal Goals (by the next clinic visit) :     Personal A1C goal: 7  (08/03/2009)     Personal blood pressure goal: 130/80  (08/03/2009)     Personal LDL goal: 100  (08/03/2009)    Patient will work on the following items until the next clinic visit to reach self-care goals:     Medications and monitoring: take my  medicines every day, bring all of my medications to every visit, examine my feet every day  (03/20/2010)     Eating: drink diet soda or water instead of juice or soda, use fresh or frozen vegetables  (03/20/2010)     Activity: take a 30 minute walk every day  (03/20/2010)    Diabetes self-management support: Copy of home glucose meter record, Written self-care plan, Referred for DM self-management training  (03/20/2010)   Diabetes care plan printed   Last diabetes self-management training by diabetes educator: 09/04/2009   Last medical nutrition therapy: 03/07/2008    Lipid self-management support: Written self-care plan  (03/20/2010)   Lipid self-care plan printed.   Nursing Instructions:    Process Orders Check Orders Results:     [facility]: ABN not required for this insurance Order queued for requisitioning for [facility]: March 20, 2010 4:30 PM Tests Sent for requisitioning (March 20, 2010 4:41 PM):     03/20/2010: [facility] -- T-Wet Prep by Molecular Probe [phone] (signed)     03/20/2010: [facility] -- [employer], genital [phone] (signed)     03/20/2010: [facility] -- T-Chlamydia Probe, genital [phone] (signed)     Diabetes Self Management Training Referral Patient Name: Jennifer [name] Date Of Birth: February 22, 1971 MRN: [id number] Current Diagnosis:  DYSURIA (ICD-788.1) CANDIDIASIS, SKIN (ICD-112.3) ROUTINE OR RITUAL CIRCUMCISION (ICD-V50.2) ANEMIA, IRON DEFICIENCY (ICD-280.9) GLAUCOMA ASSOCIATED WITH OCULAR INFLAMMATIONS (ICD-365.62) UVEITIS (ICD-364.3) R/O SARCOIDOSIS (ICD-135) VISUAL ACUITY, DECREASED, LEFT EYE (ICD-369.9) HAIR LOSS (ICD-704.00) HEALTH SCREENING (ICD-V70.0) HYPERLIPIDEMIA (ICD-272.4) ABNORMAL PAP SMEAR, LGSIL (ICD-795.09) DIABETES MELLITUS, TYPE II, UNCONTROLLED (ICD-250.02)   Complicating Conditions:   Laboratory Results   Urine Tests  Date/Time  Recieved: 03/20/10 3:50  Date/Time Reported: 03/20/10  3:50PM  Routine Urinalysis   Color: lt. yellow Glucose: 100   (Normal Range: Negative) Bilirubin: negative   (Normal Range: Negative) Ketone: negative   (Normal Range: Negative) Spec. Gravity: <1.005   (Normal Range: 1.003-1.035) Blood: trace-intact   (Normal Range: Negative) pH: 5.5   (Normal Range: 5.0-8.0) Protein: negative   (Normal Range: Negative) Urobilinogen: 0.2   (Normal Range: 0-1) Nitrite: negative   (Normal Range: Negative) Leukocyte Esterace: small   (Normal Range: Negative)     Blood Tests     CBG Random: 116  Prescriptions: METRONIDAZOLE 0.75 % CREA (METRONIDAZOLE) Apply intravaginally at bedtime for 7 days.  #1 tube x 0   Entered and Authorized by:   Deatra Robinson MD   Signed by:   Deatra Robinson MD on 03/20/2010   Method used:   Electronically to        [facility] [phone]* (retail)       [address]       [place], [place]  [address]       Ph: [phone]       Fax: [phone]   RxID:   [id number] FLUCONAZOLE 150 MG TABS (FLUCONAZOLE) Take one tablet by mouth times one. May repeat if no resolution of symptoms.  #3 x 0   Entered and Authorized by:   Deatra Robinson MD   Signed by:   Deatra Robinson MD on 03/20/2010   Method used:   Electronically to        [facility] [phone]* (retail)       [address]       [place], [place]  [address]       Ph: [phone]       Fax: [phone]   RxID:   [id number] NYSTATIN 100000 UNIT/GM CREA (NYSTATIN) Apply to affected area twice a day as needed.  #60 g x 3   Entered and Authorized by:   Deatra Robinson MD   Signed by:   Deatra Robinson MD on 03/20/2010   Method used:   Electronically to        [facility] [phone]* (retail)       [address]       [place], [place]  [address]       Ph: [phone]       Fax: [phone]   RxID:   [id number] METRONIDAZOLE 0.75 % CREA (METRONIDAZOLE) Apply intravaginally  at bedtime for 7 days.  #1 tube x 0   Entered and Authorized by:   Deatra Robinson MD   Signed by:   Deatra Robinson MD on 03/20/2010   Method used:   [facility]ron[facility] to        [facility]  [address] [phone]* (retail)       [address]       [place], [place]  [address]       Ph: [phone]       Fax: [phone]   RxID:   [id number] FLUCONAZOLE 150 MG TABS (FLUCONAZOLE) Take one tablet by mouth times one. May repeat if no resolution of symptoms.  #3 x 0   Entered and Authorized by:   Deatra Robinson MD   Signed by:   Deatra Robinson MD on 03/20/2010   Method used:   [facility]ron[facility] to        [facility]  [address] [phone]* (retail)       [address]       [place], [place]  [address]       Ph: [phone]       Fax: [phone]   RxID:   [id number]   Process Orders Check Orders Results:     [facility]: ABN not required for this insurance Order queued for requisitioning for [facility]: March 20, 2010 4:30 PM Tests Sent for requisitioning (March 20, 2010 4:41 PM):     03/20/2010: [facility] -- T-Wet Prep by Molecular Probe [phone] (signed)  03/20/2010: [facility] -- T-GC Probe, genital [phone] (signed)     03/20/2010: [facility] -- T-Chlamydia Probe, genital [phone] (signed)

## 2010-05-22 NOTE — Assessment & Plan Note (Signed)
Summary: 2WK F/U/WILSON/VS   Vital Signs:  Patient profile:   40 year old female Height:      64 inches (162.56 cm) Weight:      196.5 pounds (89.32 kg) BMI:     33.85 Temp:     96.8 degrees F (36 degrees C) oral Pulse rate:   74 / minute BP sitting:   119 / 77  (right arm)  Vitals Entered By: Stanton Kidney Ditzler RN (May 09, 2009 12:05 PM) Is Patient Diabetic? Yes Did you bring your meter with you today? Yes Pain Assessment Patient in pain? no      Nutritional Status BMI of > 30 = obese Nutritional Status Detail appetite good CBG Result 303  Have you ever been in a relationship where you felt threatened, hurt or afraid?denies   Does patient need assistance? Functional Status Self care Ambulation Normal Comments Husband and interpreter with pt. FU and refill on Nystatin cream.   Primary Care Provider:  Joaquin Courts  MD   History of Present Illness: This is a  year old woman with past medical history of   Diabetes mellitus, type II, uncontrolled diagnosed 6 yrs ago w/ pregnancy Uveitis, hx of Abnormal Pap smear, hx of (8/05) Hyperlipidemia Chest pain, hx of (EKG with poor R-wave progression) ? hx of glaucoma  She is here for follow up of diabetes managment.  She was started on novolog 2 units with her 3 largest meals at last appointment 2 weeks ago.  Last A1c was 11+.  She was to continue with lantus at 25 units nightly.  Her cbg today is 303.  Her meter is here and download shows readings from 95 to 400's with most in the 2-300's.  She says that she is feeling much better.  She is here with an interpreter and her husband.  Depression History:      The patient denies a depressed mood most of the day and a diminished interest in her usual daily activities.         Preventive Screening-Counseling & Management  Alcohol-Tobacco     Alcohol drinks/day: 0     Smoking Status: never     Smoking Cessation Counseling: yes  Caffeine-Diet-Exercise     Caffeine use/day:  1 expresso a day     Does Patient Exercise: no     Type of exercise: WALKING     Times/week: 3-4  Medications Prior to Update: 1)  Metformin Hcl 1000 Mg Tabs (Metformin Hcl) .... Take 1 Tablet By Mouth Two Times A Day 2)  Truetrack Blood Glucose  Devi (Blood Glucose Monitoring Suppl) .... Test Blood Sugar 2-4 Times A Day As Directed 3)  Lantus Solostar 100 Unit/ml Soln (Insulin Glargine) .... Inject 25 Units Lantus Every Day At About The Same Time 4)  Combigan 0.2-0.5 % Soln (Brimonidine Tartrate-Timolol) .... One Drop Twice Daily To Affected Eye. 5)  Acyclovir 800 Mg Tabs (Acyclovir) .... Take 1 Tablet By Mouth Two Times A Day 6)  Isopto Hyoscine 0.25 % Soln (Scopolamine Hbr) 7)  Iron Supplement 325 (65 Fe) Mg Tabs (Ferrous Sulfate) .... Take 1 Tablet By Mouth Three Times A Day 8)  Prodigy Blood Glucose Test  Strp (Glucose Blood) .... Use To Check Blood Sugar Three Times A Day Before Meals 9)  Prodigy Twist Top Lancets 28g  Misc (Lancets) .... Use To Test Blood Sugars 3x A Day 10)  Prodigy Insulin Syringe 31g X 5/16" 0.5 Ml Misc (Insulin Syringe-Needle U-100) .... Use To Inject  Insulin Once Daily 11)  Novolog Penfill 100 Unit/ml Soln (Insulin Aspart) .... Inject 2 Units Before Each Meal.  Allergies: 1)  ! * Pork  Review of Systems       per hpi  Physical Exam  General:  alert and well-developed.   Psych:  Oriented X3, memory intact for recent and remote, normally interactive, good eye contact, and not anxious appearing.     Impression & Recommendations:  Problem # 1:  DIABETES MELLITUS, TYPE II, UNCONTROLLED (ICD-250.02)  cbg's are still high 200-300-400's went to eye doctor 3 months ago.. but not sure if she had acute visit or dm eye exam.  she does not know who she went to, will call with this information. will increase novolog to 5 units three times a day will increase lantus to 28 units daily. continue metformin rtc with meter in 2 weeks.  Her updated medication list  for this problem includes:    Metformin Hcl 1000 Mg Tabs (Metformin hcl) .Marland Kitchen... Take 1 tablet by mouth two times a day    Lantus Solostar 100 Unit/ml Soln (Insulin glargine) ..... Inject 28 units lantus every day at about the same time    Novolog Penfill 100 Unit/ml Soln (Insulin aspart) ..... Inject 5 units before your 3 biggest meals.  Orders: Ophthalmology Referral (Ophthalmology)  Problem # 2:  ABNORMAL PAP SMEAR, LGSIL (ICD-795.09) She follows at [facility] hospital had a mamogram 10/10 and pap 11/10 normal  Problem # 3:  CANDIDIASIS, SKIN (ICD-112.3) will refill nystatin triamcinolone cream for intergluteal intertrigo.  Complete Medication List: 1)  Metformin Hcl 1000 Mg Tabs (Metformin hcl) .... Take 1 tablet by mouth two times a day 2)  Truetrack Blood Glucose Devi (Blood glucose monitoring suppl) .... Test blood sugar 2-4 times a day as directed 3)  Lantus Solostar 100 Unit/ml Soln (Insulin glargine) .... Inject 28 units lantus every day at about the same time 4)  Combigan 0.2-0.5 % Soln (Brimonidine tartrate-timolol) .... One drop twice daily to affected eye. 5)  Acyclovir 800 Mg Tabs (Acyclovir) .... Take 1 tablet by mouth two times a day 6)  Isopto Hyoscine 0.25 % Soln (Scopolamine hbr) 7)  Iron Supplement 325 (65 Fe) Mg Tabs (Ferrous sulfate) .... Take 1 tablet by mouth three times a day 8)  Prodigy Blood Glucose Test Strp (Glucose blood) .... Use to check blood sugar three times a day before meals 9)  Prodigy Twist Top Lancets 28g Misc (Lancets) .... Use to test blood sugars 3x a day 10)  Prodigy Insulin Syringe 31g X 5/16" 0.5 Ml Misc (Insulin syringe-needle u-100) .... Use to inject insulin once daily 11)  Novolog Penfill 100 Unit/ml Soln (Insulin aspart) .... Inject 5 units before your 3 biggest meals. 12)  Nystatin-triamcinolone 100000-0.1 Unit/gm-% Crea (Nystatin-triamcinolone) .... Use on affected area two times a day.  Other Orders: Capillary Blood Glucose/CBG  ([id number])  Patient Instructions: 1)  Take lantus 28 units once a day. 2)  Take novolog 5 units before your three biggest meals of the day. 3)  Continue to check your blood sugars before three biggest meals and before bedtime. 4)  Please schedule a follow-up appointment in 2 weeks. Prescriptions: PRODIGY INSULIN SYRINGE 31G X 5/16" 0.5 ML MISC (INSULIN SYRINGE-NEEDLE U-100) use to inject insulin once daily  #1 box x 6   Entered and Authorized by:   Elby Showers MD   Signed by:   Elby Showers MD on 05/09/2009   Method used:   Electronically to  [facility] [address] [phone]* (retail)       [address]       [place], [place]  [address]       Ph: [phone]       Fax: [phone]   RxID:   [id number] NYSTATIN-TRIAMCINOLONE 100000-0.1 UNIT/GM-% CREA (NYSTATIN-TRIAMCINOLONE) Use on affected area two times a day.  #15g x 0   Entered and Authorized by:   Elby Showers MD   Signed by:   Elby Showers MD on 05/09/2009   Method used:   Electronically to        [facility] [phone]* (retail)       [address]       [place], [place]  [address]       Ph: [phone]       Fax: [phone]   RxID:   [id number]    Prevention & Chronic Care Immunizations   Influenza vaccine: Fluvax 3+  (02/23/2008)    Tetanus booster: Not documented    Pneumococcal vaccine: Not documented  Other Screening   Pap smear:  Specimen Adequacy: Satisfactory for evaluation.   Interpretation/Result:Negative for intraepithelial Lesion or Malignancy.     (01/26/2009)   Pap smear due: 01/2010   Smoking status: never  (05/09/2009)  Diabetes Mellitus   HgbA1C: 12.9  (04/19/2009)    Eye exam: No diabetic retinopathy, chronic uveitis with vision loss.     (02/09/2008)   Diabetic eye exam action/deferral: Ophthalmology referral  (05/09/2009)    Foot exam: yes  (04/24/2009)   High risk foot: Not documented   Foot care education: Done  (09/28/2008)    Urine  microalbumin/creatinine ratio: 10.8  (11/10/2008)    Diabetes flowsheet reviewed?: Yes   Progress toward A1C goal: Unchanged  Lipids   Total Cholesterol: 187  (10/03/2008)   LDL: 104  (10/03/2008)   LDL Direct: Not documented   HDL: 61  (10/03/2008)   Triglycerides: 110  (10/03/2008)    SGOT (AST): 17  (04/13/2009)   SGPT (ALT): 15  (04/13/2009)   Alkaline phosphatase: 109  (04/13/2009)   Total bilirubin: 0.3  (04/13/2009)    Lipid flowsheet reviewed?: Yes   Progress toward LDL goal: Unchanged  Self-Management Support :    Patient will work on the following items until the next clinic visit to reach self-care goals:     Medications and monitoring: take my medicines every day, check my blood sugar, bring all of my medications to every visit, examine my feet every day  (05/09/2009)     Eating: drink diet soda or water instead of juice or soda, eat more vegetables, use fresh or frozen vegetables, eat foods that are low in salt, eat baked foods instead of fried foods, eat fruit for snacks and desserts, limit or avoid alcohol  (05/09/2009)     Activity: take a 30 minute walk every day  (05/09/2009)    Diabetes self-management support: Copy of home glucose meter record  (05/09/2009)   Last diabetes self-management training by diabetes educator: 04/24/2009   Last medical nutrition therapy: 03/07/2008    Lipid self-management support: Not documented    Nursing Instructions: Refer for screening diabetic eye exam (see order)     Pap Smear  Procedure date:  01/26/2009  Findings:       Specimen Adequacy: Satisfactory for evaluation.   Interpretation/Result:Negative for intraepithelial Lesion or Malignancy.     Mammogram  Procedure date:  02/03/2009  Findings:      No specific mammographic evidence of  malignancy.    Procedures Next Due Date:    Pap Smear: 01/2010    Mammogram: 01/2010   Pap Smear  Procedure date:  01/26/2009  Findings:       Specimen Adequacy:  Satisfactory for evaluation.   Interpretation/Result:Negative for intraepithelial Lesion or Malignancy.     Mammogram  Procedure date:  02/03/2009  Findings:      No specific mammographic evidence of malignancy.    Procedures Next Due Date:    Pap Smear: 01/2010    Mammogram: 01/2010

## 2010-05-22 NOTE — Progress Notes (Signed)
Summary: new Insulin diabetes support/dmr  Phone Note Call from Patient   Caller: Son Call For: Joaquin Courts  MD Summary of Call: Call from pt's son about the Novalog Pen prescription for pt.  Prescription was [facility] to the [facility] on [address] instead of the [place].  Pt's son says that he located the Insulin and has questions about the dosing if the pt eats 5 times a day.  Question also about the amounts and when and if she should take the other insulin.  Explanation to son and referred to D. Victory Dakin for further clarification. Angelina Ok RN  April 25, 2009 4:58 PM  Initial call taken by: Angelina Ok RN,  April 25, 2009 4:57 PM  Follow-up for Phone Call        55 year old son translating says he would  tell patient to inject new insulin named Novolog  2 units before her 3 largest meals/food intake. will call back using language line to be sure patient- Willo understands. Follow-up by: Jamison Neighbor RD,CDE,  April 25, 2009 5:19 PM  Additional Follow-up for Phone Call Additional follow up Details #1::        Tried calling patient using language line/arabic interpreter- no answer, unable to leave a message.  Jamison Neighbor RD,CDE  April 26, 2009 10:18 AM tried calling patient again using language line: no answer again and unable to leave a message. Jamison Neighbor RD,CDE  April 26, 2009 4:05 PM  tried calling again using [employer]- they tried the listed phone number 3 times with no answer and no option to leave a phone number or message   Additional Follow-up by: Jamison Neighbor RD,CDE,  April 28, 2009 2:35 PM    Additional Follow-up for Phone Call Additional follow up Details #2::    tried calling patient using [employer] language line: was told a su[place]ese arabic interpreter would be better because patient was only understanding parts of what interpreter was saying. Called back with [place] arabic interpreter- asked her about new insulin   25 in the morning  i take  another insulin 3 times, asked how much and at what times? she says she just started new insulin yesterday 6 pm was her third injection, took another at 9 am today. think it is helping" yes, better". verified that she continues to take lantus and she said she was. Patient Elnoria Howard up at some point- am not sure how much she heard of the explanation to continue with both insulin and blood sugar should begin to improve and then based on her CBgs at next visit- we can help her adjust her insulin.  Follow-up by: Jamison Neighbor RD,CDE,  May 01, 2009 10:56 AM

## 2010-05-22 NOTE — Assessment & Plan Note (Signed)
Summary: EST-2 WEEK RECHECK/CH   Vital Signs:  Patient profile:   40 year old female Height:      64 inches (162.56 cm) Weight:      195.04 pounds (88.65 kg) BMI:     33.60 Temp:     97.2 degrees F (36.22 degrees C) oral Pulse rate:   79 / minute BP sitting:   124 / 73  (left arm)  Vitals Entered By: Angelina Ok RN (September 20, 2009 10:17 AM) Is Patient Diabetic? Yes Did you bring your meter with you today? Yes Pain Assessment Patient in pain? no      Nutritional Status BMI of > 30 = obese CBG Result 365  Have you ever been in a relationship where you felt threatened, hurt or afraid?No   Does patient need assistance? Functional Status Self care Ambulation Normal Comments uses interpreter.  Check up.     Primary Care Provider:  Joaquin Courts  MD   History of Present Illness: Pt is a 40 yo [place] female accompanied by her translator w/ past med hx below here for f/u on poorly controlled diabetes.  Sugars are still high routinely.  She brought her meter w/ her and she has a couple of low readings that are immediately followed by very elevated sugars 1 minute later so it seems that the low readings are innacurate.  The lows she was having at our last visit have resolved since she decreased the lantus.  She has no complaints at this time and is planning on leaving the country for 3 months next week.  She notes seeing [facility] opthamology recently.   Depression History:      The patient denies a depressed mood most of the day and a diminished interest in her usual daily activities.         Preventive Screening-Counseling & Management  Alcohol-Tobacco     Alcohol drinks/day: 0     Smoking Status: never     Smoking Cessation Counseling: yes  Current Medications (verified): 1)  Metformin Hcl 1000 Mg Tabs (Metformin Hcl) .... Take 1 Tablet By Mouth Two Times A Day 2)  Truetrack Blood Glucose  Devi (Blood Glucose Monitoring Suppl) .... Test Blood Sugar 2-4 Times A Day As  Directed 3)  Lantus Solostar 100 Unit/ml Soln (Insulin Glargine) .... Inject 38 Units Lantus Every Day At About The Same Time 4)  Combigan 0.2-0.5 % Soln (Brimonidine Tartrate-Timolol) .... One Drop Twice Daily To Affected Eye. 5)  Iron Supplement 325 (65 Fe) Mg Tabs (Ferrous Sulfate) .... Take 1 Tablet By Mouth Three Times A Day 6)  Prodigy Blood Glucose Test  Strp (Glucose Blood) .... Use To Check Blood Sugar Three Times A Day Before Meals 7)  Prodigy Twist Top Lancets 28g  Misc (Lancets) .... Use To Test Blood Sugars 3x A Day 8)  Prodigy Insulin Syringe 31g X 5/16" 0.5 Ml Misc (Insulin Syringe-Needle U-100) .... Use To Inject Insulin Once Daily 9)  Novolog Flexpen 100 Unit/ml Soln (Insulin Aspart) .... Take 4 Units Before Supper. 10)  Nystatin 100000 Unit/gm Crea (Nystatin) .... Apply To Affected Area Twice A Day As Needed. 11)  Sure Comfort Pen Needles 31g X 8 Mm Misc (Insulin Pen Needle) .... Use With Insulin Pen As Directed. 12)  Pred Forte 1 % Susp (Prednisolone Acetate) 13)  Atropine Sulfate 1 % Soln (Atropine Sulfate)  Allergies (verified): 1)  ! * Pork  Past History:  Past Medical History: Last updated: 08/03/2009 Diabetes mellitus,  type II, uncontrolled diagnosed 6 yrs ago w/ pregnancy Uveitis, hx of-Followed at [facility] Abnormal Pap smear, hx of (8/05) Hyperlipidemia Chest pain, hx of (EKG with poor R-wave progression) ? hx of glaucoma Iron def anemia  Past Surgical History: Last updated: 08/18/2007 Vaginal circumcision C section  Social History: Last updated: 11/10/2008 Married, no smoking, unemployed, lives with her husband and 4 children.  [place]udaneese immigrant; Arabic-speaking only. Education level- completed high school.  Risk Factors: Smoking Status: never (09/20/2009)  Review of Systems       as per HPI.  Physical Exam  General:  alert, pleasant, no acute distress.  Eyes:  anicteric. Lungs:  normal respiratory effort and normal breath sounds.     Heart:  normal rate, regular rhythm, and no murmur.   Abdomen:  +BS's, soft, NT and ND. Extremities:  no edema.  Psych:  mood euthymic.    Impression & Recommendations:  Problem # 1:  DIABETES MELLITUS, TYPE II, UNCONTROLLED (ICD-250.02) Hgba1c improving but still markedly elevated.  She is not having any lows anymore.  Will go ahead and add meal coverage w/ 4 units ac.  We started off w/ 4 units before dinner and she seems to be tolerating this well.  At this time, I am going to be conservative b/c she is leaving the country in two weeks and I do not want her to have problems w/ hypoglycemia while she is gone.  Will likely need to increase the regular insulin in the future.  She has made marked improvements and I am proud of her for this.  Her updated medication list for this problem includes:    Metformin Hcl 1000 Mg Tabs (Metformin hcl) .Marland Kitchen... Take 1 tablet by mouth two times a day    Lantus Solostar 100 Unit/ml Soln (Insulin glargine) ..... Inject 38 units lantus every day at about the same time    Novolog Flexpen 100 Unit/ml Soln (Insulin aspart) .Marland Kitchen... Take 4 units before breakfast, lunch, and dinner.  Orders: T- Capillary Blood Glucose (25956) T-Hgb A1C (in-house) ([id number])  Labs Reviewed: Creat: 0.58 (07/18/2009)     Last Eye Exam: No diabetic retinopathy, chronic uveitis with vision loss.    (02/09/2008) Reviewed HgBA1c results: 11.9 (09/20/2009)  13.4 (07/18/2009)  Problem # 2:  UVEITIS (ICD-364.3) F/u at [facility].  Medications added to the med list.   Problem # 3:  ANEMIA, IRON DEFICIENCY (ICD-280.9) She was inadvertently not taking iron as prescribed.  I have asked her to increase it to three times a day and will f/u repeat hgb and iron studies when she returns.  She is asymptomatic from her anemia.  Her updated medication list for this problem includes:    Iron Supplement 325 (65 Fe) Mg Tabs (Ferrous sulfate) .Marland Kitchen... Take 1 tablet by mouth three times a day  Problem  # 4:  HYPERLIPIDEMIA (ICD-272.4) She is due for lipids next month but since she is going to be out of town for 3 months, will wait and get lipid panel when she returns b/c we will not be able to monitor LFT's while she is away.  Not on statin b/c lipids on previously mildly elevated and felt like getting her sugars down is worth pursuing more at this time.   Labs Reviewed: SGOT: 17 (04/13/2009)   SGPT: 15 (04/13/2009)   HDL:61 (10/03/2008), 62 (02/01/2008)  LDL:104 (10/03/2008), 131 (02/01/2008)  Chol:187 (10/03/2008), 217 (02/01/2008)  Trig:110 (10/03/2008), 122 (02/01/2008)  Complete Medication List: 1)  Metformin Hcl 1000 Mg  Tabs (Metformin hcl) .... Take 1 tablet by mouth two times a day 2)  Truetrack Blood Glucose Devi (Blood glucose monitoring suppl) .... Test blood sugar 2-4 times a day as directed 3)  Lantus Solostar 100 Unit/ml Soln (Insulin glargine) .... Inject 38 units lantus every day at about the same time 4)  Combigan 0.2-0.5 % Soln (Brimonidine tartrate-timolol) .... One drop twice daily to affected eye. 5)  Iron Supplement 325 (65 Fe) Mg Tabs (Ferrous sulfate) .... Take 1 tablet by mouth three times a day 6)  Prodigy Blood Glucose Test Strp (Glucose blood) .... Use to check blood sugar three times a day before meals 7)  Prodigy Twist Top Lancets 28g Misc (Lancets) .... Use to test blood sugars 3x a day 8)  Prodigy Insulin Syringe 31g X 5/16" 0.5 Ml Misc (Insulin syringe-needle u-100) .... Use to inject insulin once daily 9)  Novolog Flexpen 100 Unit/ml Soln (Insulin aspart) .... Take 4 units before breakfast, lunch, and dinner. 10)  Nystatin 100000 Unit/gm Crea (Nystatin) .... Apply to affected area twice a day as needed. 11)  Sure Comfort Pen Needles 31g X 8 Mm Misc (Insulin pen needle) .... Use with insulin pen as directed. 12)  Pred Forte 1 % Susp (Prednisolone acetate) 13)  Atropine Sulfate 1 % Soln (Atropine sulfate)  Patient Instructions: 1)  Please make a followup  appointment when you get back.  2)  Please increase your regular insulin (blue pen) to 4 units before breakfast, lunch, and dinner.   3)  Keep your night insulin (lantus) the same at 38 units. 4)  Please keep checking your sugars regularly and call with any low blood sugars. 5)  Increase the iron pill to three times a day.    Vital Signs:  Patient profile:   40 year old female Height:      64 inches (162.56 cm) Weight:      195.04 pounds (88.65 kg) BMI:     33.60 Temp:     97.2 degrees F (36.22 degrees C) oral Pulse rate:   79 / minute BP sitting:   124 / 73  (left arm)  Vitals Entered By: Angelina Ok RN (September 20, 2009 10:17 AM)   Prevention & Chronic Care Immunizations   Influenza vaccine: Fluvax 3+  (02/23/2008)   Influenza vaccine deferral: Deferred  (07/18/2009)    Tetanus booster: Not documented   Td booster deferral: Deferred  (07/18/2009)    Pneumococcal vaccine: Not documented  Other Screening   Pap smear:  Specimen Adequacy: Satisfactory for evaluation.   Interpretation/Result:Negative for intraepithelial Lesion or Malignancy.     (01/26/2009)   Pap smear due: 01/2010   Smoking status: never  (09/20/2009)  Diabetes Mellitus   HgbA1C: 11.9  (09/20/2009)    Eye exam: No diabetic retinopathy, chronic uveitis with vision loss.     (02/09/2008)   Diabetic eye exam action/deferral: Ophthalmology referral  (08/03/2009)    Foot exam: yes  (04/24/2009)   High risk foot: Not documented   Foot care education: Done  (09/28/2008)    Urine microalbumin/creatinine ratio: 10.8  (11/10/2008)  Lipids   Total Cholesterol: 187  (10/03/2008)   LDL: 104  (10/03/2008)   LDL Direct: Not documented   HDL: 61  (10/03/2008)   Triglycerides: 110  (10/03/2008)    SGOT (AST): 17  (04/13/2009)   SGPT (ALT): 15  (04/13/2009)   Alkaline phosphatase: 109  (04/13/2009)   Total bilirubin: 0.3  (04/13/2009)  Self-Management Support :  Personal Goals (by the next clinic visit)  :     Personal A1C goal: 7  (08/03/2009)     Personal blood pressure goal: 130/80  (08/03/2009)     Personal LDL goal: 100  (08/03/2009)    Patient will work on the following items until the next clinic visit to reach self-care goals:     Medications and monitoring: take my medicines every day, check my blood sugar, bring all of my medications to every visit, examine my feet every day  (09/20/2009)     Eating: drink diet soda or water instead of juice or soda, eat more vegetables, use fresh or frozen vegetables, eat foods that are low in salt, eat baked foods instead of fried foods, eat fruit for snacks and desserts, limit or avoid alcohol  (09/20/2009)     Activity: take a 30 minute walk every day, take the stairs instead of the elevator  (09/20/2009)    Diabetes self-management support: Copy of home glucose meter record, Written self-care plan, Education handout, Pre-printed educational material  (09/20/2009)   Diabetes care plan printed   Diabetes education handout printed   Last diabetes self-management training by diabetes educator: 09/04/2009   Last medical nutrition therapy: 03/07/2008    Lipid self-management support: Written self-care plan, Education handout, Pre-printed educational material  (09/20/2009)   Lipid self-care plan printed.   Lipid education handout printed     Laboratory Results   Blood Tests   Date/Time Received: September 20, 2009 10:34 AM Date/Time Reported: Alric Quan  September 20, 2009 10:34 AM   HGBA1C: 11.9%   (Normal Range: Non-Diabetic - 3-6%   Control Diabetic - 6-8%) CBG Random:: 365mg /dL

## 2010-05-22 NOTE — Assessment & Plan Note (Signed)
Summary: Social Work  Met with patient and husband on 12/29 office visit.  They were looking for DSS paperwork that needed to be completed by physician regarding patient's medical condition and ability to engage in work or work training.  Once the form was located via [place], physician and I completed/Dr. Aleene Davidson signed. Will call family for pick-up.

## 2010-05-22 NOTE — Assessment & Plan Note (Signed)
Summary: EST-2 WEEK RECHECK/CH   Vital Signs:  Patient profile:   40 year old female Height:      64 inches (162.56 cm) Weight:      189.2 pounds (86.00 kg) BMI:     32.59 Temp:     98.2 degrees F (36.78 degrees C) oral Pulse rate:   75 / minute BP sitting:   124 / 80  (right arm)  Vitals Entered By: Chinita Pester RN (August 03, 2009 2:15 PM) CC: 2 week f/u. Is Patient Diabetic? Yes Did you bring your meter with you today? Yes Pain Assessment Patient in pain? no      Nutritional Status BMI of > 30 = obese CBG Result 258  Have you ever been in a relationship where you felt threatened, hurt or afraid?No   Does patient need assistance? Functional Status Self care Ambulation Normal Comments Interpeter w/pt.   Primary Care Provider:  Joaquin Courts  MD  CC:  2 week f/u.Marland Kitchen  History of Present Illness: Pt is a 40 yo [place] female accompanied by her translator here for f/u of diabetes.  At our last visit, her a1c was noted to be very elevated and she had inadvertently stopped several of her medicines.  She has since restarted the  metformin and iron supplementation.  She has her meter w/ her and her blood sugars have been averaging 101-267, checking them once daily.  She has not been to the eye doctor in > 1 year at [facility].   Depression History:      The patient denies a depressed mood most of the day and a diminished interest in her usual daily activities.         Preventive Screening-Counseling & Management  Alcohol-Tobacco     Alcohol drinks/day: 0     Smoking Status: never     Smoking Cessation Counseling: yes  Caffeine-Diet-Exercise     Caffeine use/day: 1 expresso a day     Does Patient Exercise: no     Type of exercise: WALKING     Times/week: 3-4  Current Medications (verified): 1)  Metformin Hcl 1000 Mg Tabs (Metformin Hcl) .... Take 1 Tablet By Mouth Two Times A Day 2)  Truetrack Blood Glucose  Devi (Blood Glucose Monitoring Suppl) .... Test Blood  Sugar 2-4 Times A Day As Directed 3)  Lantus Solostar 100 Unit/ml Soln (Insulin Glargine) .... Inject 42 Units Lantus Every Day At About The Same Time 4)  Combigan 0.2-0.5 % Soln (Brimonidine Tartrate-Timolol) .... One Drop Twice Daily To Affected Eye. 5)  Isopto Hyoscine 0.25 % Soln (Scopolamine Hbr) 6)  Iron Supplement 325 (65 Fe) Mg Tabs (Ferrous Sulfate) .... Take 1 Tablet By Mouth Three Times A Day 7)  Prodigy Blood Glucose Test  Strp (Glucose Blood) .... Use To Check Blood Sugar Three Times A Day Before Meals 8)  Prodigy Twist Top Lancets 28g  Misc (Lancets) .... Use To Test Blood Sugars 3x A Day 9)  Prodigy Insulin Syringe 31g X 5/16" 0.5 Ml Misc (Insulin Syringe-Needle U-100) .... Use To Inject Insulin Once Daily 10)  Novolog Flexpen 100 Unit/ml Soln (Insulin Aspart) .... Take As Directed. 11)  Nystatin 100000 Unit/gm Crea (Nystatin) .... Apply To Affected Area Twice A Day As Needed. 12)  Sure Comfort Pen Needles 31g X 8 Mm Misc (Insulin Pen Needle) .... Use With Insulin Pen As Directed.  Allergies (verified): 1)  ! * Pork  Past History:  Social History: Last updated: 11/10/2008 Married, no  smoking, unemployed, lives with her husband and 4 children.  [place] immigrant; Arabic-speaking only. Education level- completed high school.  Past Medical History: Diabetes mellitus, type II, uncontrolled diagnosed 6 yrs ago w/ pregnancy Uveitis, hx of-Followed at [facility] Abnormal Pap smear, hx of (8/05) Hyperlipidemia Chest pain, hx of (EKG with poor R-wave progression) ? hx of glaucoma Iron def anemia  Social History: Reviewed history from 11/10/2008 and no changes required. Married, no smoking, unemployed, lives with her husband and 4 children.  [place] immigrant; Arabic-speaking only. Education level- completed high school.  Review of Systems       as per hpi.   Physical Exam  General:  alert, pleasant, appropriately groomed, no distress. Eyes:  anicteric.  Lungs:   normal respiratory effort and normal breath sounds.   Heart:  normal rate, regular rhythm, and no murmur.   Abdomen:  soft, non-tender, normal bowel sounds, and no distention.   Extremities:  no peripheral edema.  Neurologic:  gait normal.   Psych:  mood euthymic.   Impression & Recommendations:  Problem # 1:  DIABETES MELLITUS, TYPE II, UNCONTROLLED (ICD-250.02) I have slightly increased lantus but she does have some am sugars that are ok.  I have asked her to increase checking her sugars to 3 x's a day, f/u w/ Jamison Neighbor in 2 weeks for the goal of adding regular insulin at meals, and then to f/u w/ me in 2 weeks to see how this transition is going.    Her updated medication list for this problem includes:    Metformin Hcl 1000 Mg Tabs (Metformin hcl) .Marland Kitchen... Take 1 tablet by mouth two times a day    Lantus Solostar 100 Unit/ml Soln (Insulin glargine) ..... Inject 45 units lantus every day at about the same time    Novolog Flexpen 100 Unit/ml Soln (Insulin aspart) .Marland Kitchen... Take as directed.  Orders: Ophthalmology Referral (Ophthalmology)  Problem # 2:  UVEITIS (ICD-364.3) I have advised her to return to [facility] b/c per their note, they wanted to see her back before now.  We have called and she apparently no-showed to her last appt but we will reschedule it for her.   Orders: Ophthalmology Referral (Ophthalmology)  Problem # 3:  ANEMIA, IRON DEFICIENCY (ICD-280.9) She has restarted iron.  Will f/u repeat hgb and iron studies in 1 month.  Her updated medication list for this problem includes:    Iron Supplement 325 (65 Fe) Mg Tabs (Ferrous sulfate) .Marland Kitchen... Take 1 tablet by mouth three times a day  Complete Medication List: 1)  Metformin Hcl 1000 Mg Tabs (Metformin hcl) .... Take 1 tablet by mouth two times a day 2)  Truetrack Blood Glucose Devi (Blood glucose monitoring suppl) .... Test blood sugar 2-4 times a day as directed 3)  Lantus Solostar 100 Unit/ml Soln (Insulin glargine)  .... Inject 45 units lantus every day at about the same time 4)  Combigan 0.2-0.5 % Soln (Brimonidine tartrate-timolol) .... One drop twice daily to affected eye. 5)  Isopto Hyoscine 0.25 % Soln (Scopolamine hbr) 6)  Iron Supplement 325 (65 Fe) Mg Tabs (Ferrous sulfate) .... Take 1 tablet by mouth three times a day 7)  Prodigy Blood Glucose Test Strp (Glucose blood) .... Use to check blood sugar three times a day before meals 8)  Prodigy Twist Top Lancets 28g Misc (Lancets) .... Use to test blood sugars 3x a day 9)  Prodigy Insulin Syringe 31g X 5/16" 0.5 Ml Misc (Insulin syringe-needle u-100) .... Use  to inject insulin once daily 10)  Novolog Flexpen 100 Unit/ml Soln (Insulin aspart) .... Take as directed. 11)  Nystatin 100000 Unit/gm Crea (Nystatin) .... Apply to affected area twice a day as needed. 12)  Sure Comfort Pen Needles 31g X 8 Mm Misc (Insulin pen needle) .... Use with insulin pen as directed.  Other Orders: Capillary Blood Glucose/CBG (82956)  Patient Instructions: 1)  Please make a followup appointment in 1 month with me to check your blood sugar. 2)  Please see Jamison Neighbor in 2 weeks.  MAKE SURE TO CHECK YOUR BLOOD SUGARS 3 TIMES A DAY AND BRING YOUR METER WITH YOU TO THAT VISIT. 3)  Please see the eye doctor at [facility] for a checkup on your eyes.  4)  Please increase lantus to 45 units at bedtime.  Prescriptions: PRODIGY TWIST TOP LANCETS 28G  MISC (LANCETS) use to test blood sugars 3x a day  #1 box x 11   Entered and Authorized by:   Joaquin Courts  MD   Signed by:   Joaquin Courts  MD on 08/03/2009   Method used:   Electronically to        [facility] [phone]* (retail)       [address]       [place], [place]  [address]       Ph: [phone]       Fax: [phone]   RxID:   [id number] PRODIGY BLOOD GLUCOSE TEST  STRP (GLUCOSE BLOOD) use to check blood sugar three times a day before meals  #1 box x 11   Entered and Authorized by:   Joaquin Courts   MD   Signed by:   Joaquin Courts  MD on 08/03/2009   Method used:   Electronically to        [facility] [phone]* (retail)       [address]       [place], [place]  [address]       Ph: [phone]       Fax: [phone]   RxID:   [id number] SURE COMFORT PEN NEEDLES 31G X 8 MM MISC (INSULIN PEN NEEDLE) Use with insulin pen as directed.  #1 box x prn   Entered and Authorized by:   Joaquin Courts  MD   Signed by:   Joaquin Courts  MD on 08/03/2009   Method used:   Electronically to        [facility] [phone]* (retail)       [address]       [place], [place]  [address]       Ph: [phone]       Fax: [phone]   RxID:   [id number] NYSTATIN 100000 UNIT/GM CREA (NYSTATIN) Apply to affected area twice a day as needed.  #60 g x 3   Entered and Authorized by:   Joaquin Courts  MD   Signed by:   Joaquin Courts  MD on 08/03/2009   Method used:   Electronically to        [facility] [phone]* (retail)       [address]       [place], [place]  [address]       Ph: [phone]       Fax: [phone]   RxID:   [id number] PRODIGY INSULIN SYRINGE 31G X 5/16" 0.5 ML MISC (INSULIN SYRINGE-NEEDLE U-100) use to inject insulin once daily  #1 box x 6   Entered  and Authorized by:   Joaquin Courts  MD   Signed by:   Joaquin Courts  MD on 08/03/2009   Method used:   Electronically to        [facility] [phone]* (retail)       [address]       [place], [place]  [address]       Ph: [phone]       Fax: [phone]   RxID:   [id number] NOVOLOG FLEXPEN 100 UNIT/ML SOLN (INSULIN ASPART) Take as directed.  #1 box x 6   Entered and Authorized by:   Joaquin Courts  MD   Signed by:   Joaquin Courts  MD on 08/03/2009   Method used:   Electronically to        [facility] [phone]* (retail)       [address]       [place], [place]  [address]       Ph: [phone]       Fax: [phone]   RxID:   [id number] IRON SUPPLEMENT 325 (65  FE) MG TABS (FERROUS SULFATE) Take 1 tablet by mouth three times a day  #90 x 6   Entered and Authorized by:   Joaquin Courts  MD   Signed by:   Joaquin Courts  MD on 08/03/2009   Method used:   Electronically to        [facility] [phone]* (retail)       [address]       [place], [place]  [address]       Ph: [phone]       Fax: [phone]   RxID:   [id number] LANTUS SOLOSTAR 100 UNIT/ML SOLN (INSULIN GLARGINE) Inject 40 units Lantus every day at about the same time  #3 bottles x 6   Entered and Authorized by:   Joaquin Courts  MD   Signed by:   Joaquin Courts  MD on 08/03/2009   Method used:   Electronically to        [facility] [facility] [phone]* (retail)       [address]       [place], [place]  [address]       Ph: [phone]       Fax: [phone]   RxID:   [id number] METFORMIN HCL 1000 MG TABS (METFORMIN HCL) Take 1 tablet by mouth two times a day  #62 x 6   Entered and Authorized by:   Joaquin Courts  MD   Signed by:   Joaquin Courts  MD on 08/03/2009   Method used:   Electronically to        [facility] [phone]* (retail)       [address]       [place], [place]  [address]       Ph: [phone]       Fax: [phone]   RxID:   [id number]   Prevention & Chronic Care Immunizations   Influenza vaccine: Fluvax 3+  (02/23/2008)   Influenza vaccine deferral: Deferred  (07/18/2009)    Tetanus booster: Not documented   Td booster deferral: Deferred  (07/18/2009)    Pneumococcal vaccine: Not documented  Other Screening   Pap smear:  Specimen Adequacy: Satisfactory for evaluation.   Interpretation/Result:Negative for intraepithelial Lesion or Malignancy.     (01/26/2009)   Pap smear due: 01/2010   Smoking status: never  (08/03/2009)  Diabetes Mellitus   HgbA1C: 13.4  (  07/18/2009)    Eye exam: No diabetic retinopathy, chronic uveitis with vision loss.     (02/09/2008)   Diabetic eye exam action/deferral: Ophthalmology referral   (08/03/2009)    Foot exam: yes  (04/24/2009)   High risk foot: Not documented   Foot care education: Done  (09/28/2008)    Urine microalbumin/creatinine ratio: 10.8  (11/10/2008)    Diabetes flowsheet reviewed?: Yes   Progress toward A1C goal: Unchanged  Lipids   Total Cholesterol: 187  (10/03/2008)   LDL: 104  (10/03/2008)   LDL Direct: Not documented   HDL: 61  (10/03/2008)   Triglycerides: 110  (10/03/2008)    SGOT (AST): 17  (04/13/2009)   SGPT (ALT): 15  (04/13/2009)   Alkaline phosphatase: 109  (04/13/2009)   Total bilirubin: 0.3  (04/13/2009)    Lipid flowsheet reviewed?: Yes   Progress toward LDL goal: At goal  Self-Management Support :   Personal Goals (by the next clinic visit) :     Personal A1C goal: 7  (08/03/2009)     Personal blood pressure goal: 130/80  (08/03/2009)     Personal LDL goal: 100  (08/03/2009)    Patient will work on the following items until the next clinic visit to reach self-care goals:     Medications and monitoring: bring all of my medications to every visit, examine my feet every day  (08/03/2009)     Eating: eat more vegetables, use fresh or frozen vegetables, eat foods that are low in salt, eat baked foods instead of fried foods, eat fruit for snacks and desserts  (08/03/2009)     Activity: take a 30 minute walk every day  (08/03/2009)    Diabetes self-management support: Education handout, Resources for patients handout, Written self-care plan  (08/03/2009)   Diabetes care plan printed   Diabetes education handout printed   Last diabetes self-management training by diabetes educator: 04/24/2009   Last medical nutrition therapy: 03/07/2008    Lipid self-management support: Education handout, Resources for patients handout, Written self-care plan  (08/03/2009)   Lipid self-care plan printed.   Lipid education handout printed      Resource handout printed.   Nursing Instructions: Refer for screening diabetic eye exam (see  order)
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
## 2010-05-22 NOTE — Letter (Signed)
Summary: MEDICAL RECORDS-[facility]   Imported By: Louretta Parma 03/22/2010 16:17:08  _____________________________________________________________________  External Attachment:    Type:   Image     Comment:   External Document

## 2010-05-22 NOTE — Letter (Signed)
Summary: BLOOD GLUCOSE 08-21-03-31-2011  BLOOD GLUCOSE 08-21-03-31-2011   Imported By: Margie Billet 09/29/2009 08:56:31  _____________________________________________________________________  External Attachment:    Type:   Image     Comment:   External Document

## 2010-05-22 NOTE — Progress Notes (Signed)
Summary: med refill/gp  Phone Note Refill Request Message from:  Patient on March 21, 2010 10:36 AM  Refills Requested: Medication #1:  PRED FORTE 1 % SUSP  Method Requested: Electronic Initial call taken by: Chinita Pester RN,  March 21, 2010 10:36 AM  Follow-up for Phone Call        Rx completed in Dr. Tiajuana Amass Follow-up by: Deatra Robinson MD,  March 22, 2010 8:24 AM    New/Updated Medications: PRED FORTE 1 % SUSP (PREDNISOLONE ACETATE) apply one drop each eye daily Prescriptions: PRED FORTE 1 % SUSP (PREDNISOLONE ACETATE) apply one drop each eye daily  #1 vial x 11   Entered and Authorized by:   Deatra Robinson MD   Signed by:   Deatra Robinson MD on 03/26/2010   Method used:   Electronically to        [facility] [phone]* (retail)       [address]       [place], [place]  [address]       Ph: [phone]       Fax: [phone]   RxID:   [id number]

## 2010-05-22 NOTE — Progress Notes (Signed)
Summary: med change/gp  Phone Note Refill Request Message from:  Fax from Pharmacy on March 21, 2010 2:02 PM  Refills Requested: Medication #1:  PRODIGY POCKET BLOOD GLUCOSE W/DEVICE KIT Check your blood sugar every morning and before dinner daily  Medication #2:  PRODIGY BLOOD GLUCOSE TEST  STRP use to check blood sugar three times a day before meals  Medication #3:  PRODIGY TWIST TOP LANCETS 28G  MISC use to test blood sugars 3x a day  Prodigy is not covered by pt.'s insurance.  Can u change to Accu check Aviva strips/meter/lancets?  Thanks   Method Requested: Electronic Initial call taken by: Chinita Pester RN,  March 21, 2010 2:04 PM  Follow-up for Phone Call        Rx completed in Dr. Tiajuana Amass Follow-up by: Deatra Robinson MD,  March 22, 2010 8:36 AM    New/Updated Medications: ACCU-CHEK ADVANTAGE TEST  STRP (GLUCOSE BLOOD) check you bllod sugar  two times a day ac or as directed ACCU-CHEK ADVANTAGE DIABETES  KIT (BLOOD GLUCOSE MONITORING SUPPL) Check blood glucose two times a day ac ACCU-CHEK SOFT TOUCH LANCETS  MISC (LANCETS) Check your blood glucose two times a day ac Prescriptions: ACCU-CHEK SOFT TOUCH LANCETS  MISC (LANCETS) Check your blood glucose two times a day ac  #100 x 11   Entered and Authorized by:   Deatra Robinson MD   Signed by:   Deatra Robinson MD on 03/22/2010   Method used:   Electronically to        [facility] [address] [phone]* (retail)       [address]       [place], [place]  [address]       Ph: [phone]       Fax: [phone]   RxID:   [id number] ACCU-CHEK ADVANTAGE DIABETES  KIT (BLOOD GLUCOSE MONITORING SUPPL) Check blood glucose two times a day ac  #1 x 0   Entered and Authorized by:   Deatra Robinson MD   Signed by:   Deatra Robinson MD on 03/22/2010   Method used:   Electronically to        [facility] [address] [phone]* (retail)       [address]       [place], [place]  [address]       Ph: [phone]       Fax:  [phone]   RxID:   [id number] ACCU-CHEK ADVANTAGE TEST  STRP (GLUCOSE BLOOD) check you bllod sugar  two times a day ac or as directed  #100 x 11   Entered and Authorized by:   Deatra Robinson MD   Signed by:   Deatra Robinson MD on 03/22/2010   Method used:   Electronically to        [facility] [phone]* (retail)       [address]       [place], [place]  [address]       Ph: [phone]       Fax: [phone]   RxID:   [id number]

## 2010-05-22 NOTE — Letter (Signed)
Summary: Pharmacologist   Imported By: Florinda Marker 05/10/2009 15:08:17  _____________________________________________________________________  External Attachment:    Type:   Image     Comment:   External Document

## 2010-05-24 NOTE — Progress Notes (Signed)
Summary: Equipment change needed  Phone Note Refill Request   Need to be Accucheck Aviva or the Compact   Medicaid will only pay for these.  Others will not be covered.  Call for the Test strips and the meter.   Method Requested: Electronic Initial call taken by: Angelina Ok RN,  March 22, 2010 1:27 PM    New/Updated Medications: ACCU-CHEK AVIVA  KIT (BLOOD GLUCOSE MONITORING SUPPL) use as directed Prescriptions: ACCU-CHEK AVIVA  KIT (BLOOD GLUCOSE MONITORING SUPPL) use as directed  #1 x 0   Entered by:   Chinita Pester RN   Authorized by:   Deatra Robinson MD   Signed by:   Deatra Robinson MD on 03/26/2010   Method used:   Electronically to        [facility] [phone]* (retail)       [address]       [place], [place]  [address]       Ph: [phone]       Fax: [phone]   RxID:   [id number]

## 2010-05-29 ENCOUNTER — Encounter: Payer: Self-pay | Admitting: Internal Medicine

## 2010-05-30 NOTE — Progress Notes (Signed)
Summary: Update PAP Results  Phone Note Other Incoming   Summary of Call: Last PAP per EChart 01-26-2009 Vickie Joyce [facility]  May 21, 2010 1:29 PM

## 2010-06-14 ENCOUNTER — Encounter: Payer: Self-pay | Admitting: *Deleted

## 2010-06-14 ENCOUNTER — Encounter: Payer: Self-pay | Admitting: Ophthalmology

## 2010-06-14 ENCOUNTER — Ambulatory Visit (HOSPITAL_COMMUNITY)
Admission: RE | Admit: 2010-06-14 | Discharge: 2010-06-14 | Disposition: A | Discharge status: Home / Self Care | Payer: Medicaid Other | Source: Ambulatory Visit | Attending: Internal Medicine | Admitting: Internal Medicine

## 2010-06-14 ENCOUNTER — Ambulatory Visit (INDEPENDENT_AMBULATORY_CARE_PROVIDER_SITE_OTHER): Payer: Medicaid Other | Admitting: Ophthalmology

## 2010-06-14 DIAGNOSIS — R10814 Left lower quadrant abdominal tenderness: Secondary | ICD-10-CM

## 2010-06-14 DIAGNOSIS — R1032 Left lower quadrant pain: Secondary | ICD-10-CM | POA: Insufficient documentation

## 2010-06-14 DIAGNOSIS — Z79899 Other long term (current) drug therapy: Secondary | ICD-10-CM

## 2010-06-14 DIAGNOSIS — B372 Candidiasis of skin and nail: Secondary | ICD-10-CM

## 2010-06-14 LAB — URINALYSIS, MICROSCOPIC ONLY
Casts: NONE SEEN
Crystals: NONE SEEN

## 2010-06-14 LAB — DIFFERENTIAL
Eosinophils Absolute: 0.4 10*3/uL (ref 0.0–0.7)
Lymphs Abs: 3 10*3/uL (ref 0.7–4.0)
Monocytes Relative: 4 % (ref 3–12)
Neutrophils Relative %: 40 % — ABNORMAL LOW (ref 43–77)

## 2010-06-14 LAB — CBC
HCT: 38.1 % (ref 36.0–46.0)
Hemoglobin: 12.3 g/dL (ref 12.0–15.0)
MCH: 25.1 pg — ABNORMAL LOW (ref 26.0–34.0)
MCV: 77.6 fL — ABNORMAL LOW (ref 78.0–100.0)
RBC: 4.91 MIL/uL (ref 3.87–5.11)

## 2010-06-14 LAB — BASIC METABOLIC PANEL
CO2: 27 mEq/L (ref 19–32)
Calcium: 9.4 mg/dL (ref 8.4–10.5)
Glucose, Bld: 283 mg/dL — ABNORMAL HIGH (ref 70–99)
Sodium: 135 mEq/L (ref 135–145)

## 2010-06-14 LAB — URINALYSIS, ROUTINE W REFLEX MICROSCOPIC
Leukocytes, UA: NEGATIVE
Nitrite: NEGATIVE
Specific Gravity, Urine: 1.016 (ref 1.005–1.030)
pH: 7 (ref 5.0–8.0)

## 2010-06-14 LAB — POCT GLYCOSYLATED HEMOGLOBIN (HGB A1C): Hemoglobin A1C: 11.5

## 2010-06-14 MED ORDER — INSULIN ASPART 100 UNIT/ML ~~LOC~~ SOLN: 4.0000 [IU] | Freq: Three times a day (TID) | SUBCUTANEOUS | Status: DC

## 2010-06-14 MED ORDER — NYSTATIN 100000 UNIT/GM EX CREA: TOPICAL_CREAM | CUTANEOUS | Status: DC | PRN

## 2010-06-14 MED ORDER — ACCU-CHEK AVIVA KIT: PACK | Status: DC

## 2010-06-14 MED ORDER — INSULIN GLARGINE 100 UNIT/ML ~~LOC~~ SOLN: 36.0000 [IU] | Freq: Every day | SUBCUTANEOUS | Status: DC

## 2010-06-14 MED ORDER — TRAMADOL HCL 50 MG PO TABS: 50.0000 mg | ORAL_TABLET | Freq: Four times a day (QID) | ORAL | Status: DC | PRN

## 2010-06-14 NOTE — Progress Notes (Signed)
Subjective:    [name], female    DOB: 1970/11/23, 40 y.o.   MRN: [id number]  HPI  This is a 40 year old female with a past medical history significant for uveitis, hyperlipidemia, and type 2 diabetes,  who presents as a walk-in  because of a complaint of left-sided abdominal pain for 2 days. The patient was last seen in the clinic back in November of last year, at that time, the patient was treated for dysuria with Flagyl and  Diflucan.   The patient states that her pain started 2 days ago , when she had an episode of left lower quadrant pain which was an 8/10 in severity and described as sharp which lasted for approximately one hour. The patient denied any nausea or vomiting during that timeframe. The patient's symptoms resolved until last night around 9 PM when she suddenly developed 10 out of 10 pain in the left lower quadrant that prevented her from sleeping. The patient took an over-the-counter laxative which cause her to have a bowel movement and Leger improvement in her symptoms. The patient denies any fevers chills hematuria, dysuria.  The patient does state that the pain waxed and waned in intensity during the episode last night and was at its worst every 15 mins or so. The patient has no known history of kidney stones. The patient continues to be up to eat and drink regularly and has been having bowel movements normally.   With regard to her diabetes, the patient's hemoglobin A1c was 11.5 today. He is currently taking 34 units of Lantus daily as well as 4 units of NovoLog with meals. The patient has not been checking her CBGs at home she states that her glucometer is no longer working. The patient has not noted any breakdown on the skin of her feet, and states she is trying to watch her diet.  Review of Systems  Constitutional: Negative for fever and chills.  Respiratory: Negative for cough and shortness of breath.   Cardiovascular: Negative for chest pain and palpitations.    Gastrointestinal: Negative for vomiting, diarrhea and constipation.       Objective:   Physical Exam  Constitutional: She appears well-developed and well-nourished.  HENT:  Head: Normocephalic and atraumatic.  Eyes: Pupils are equal, round, and reactive to light.  Cardiovascular: Normal rate, regular rhythm and intact distal pulses.  Exam reveals no gallop and no friction rub.   No murmur heard. Pulmonary/Chest: Effort normal and breath sounds normal. She has no wheezes. She has no rales.  Abdominal: Soft. Bowel sounds are normal. She exhibits no distension. There is tenderness.       The patient has mild left lower quadrant tenderness with no rebound tenderness or guarding.   Musculoskeletal: Normal range of motion.  Neurological: She is alert. No cranial nerve deficit.  Skin: No rash noted.        Current Outpatient Prescriptions on File Prior to Visit  Medication Sig Dispense Refill  . atropine 1 % ophthalmic solution Prescribed by ophthalmologist at [facility]       . brimonidine-timolol (COMBIGAN) 0.2-0.5 % ophthalmic solution 1 drop 2 (two) times daily. To affected eye       . capsicum (ZOSTRIX) 0.075 % topical cream Apply small amount of cream to both feet  as needed for numbness and pain       . ferrous sulfate (IRON SUPPLEMENT) 325 (65 FE) MG tablet Take 325 mg by mouth 3 (three) times daily.        Marland Kitchen  glucose blood (ACCU-CHEK ADVANTAGE TEST) test strip Check your blood sugar 2 times a day before meals or as directed       . Insulin Pen Needle (SURE COMFORT PEN NEEDLES) 31G X 8 MM MISC Use with insulin pen as directed       . Insulin Syringe-Needle U-100 (PRODIGY INSULIN SYRINGE) 31G X 5/16" 0.5 ML MISC Use to inject insulin once daily       . Lancets (ACCU-CHEK SOFT TOUCH) lancets Check your blood glucose 2 times a day before meals       . lisinopril (PRINIVIL,ZESTRIL) 10 MG tablet Take 10 mg by mouth daily.        . metFORMIN (GLUCOPHAGE) 1000 MG tablet Take 1,000 mg by  mouth 2 (two) times daily.        . metroNIDAZOLE (METROCREAM) 0.75 % cream Place vaginally. At bedtime for 7 days       . prednisoLONE acetate (PRED FORTE) 1 % ophthalmic suspension Place 1 drop into both eyes daily.        Marland Kitchen DISCONTD: Blood Glucose Monitoring Suppl (ACCU-CHEK AVIVA) kit Use as instructed       . DISCONTD: insulin aspart (NOVOLOG FLEXPEN) 100 UNIT/ML injection Inject 4 Units into the skin 3 (three) times daily before meals.        Marland Kitchen DISCONTD: insulin glargine (LANTUS SOLOSTAR) 100 UNIT/ML injection Inject 36 Units into the skin at bedtime.       Marland Kitchen DISCONTD: nystatin (MYCOSTATIN) cream 100000 unit/GM; apply to affected area twice a day as needed         Past Medical History  Diagnosis Date  . Dysuria   . Candidiasis, skin or nails   . Routine/ritual circumcision   . Anemia, iron deficiency   . Glaucoma associated with ocular inflammation   . Uveitis   . Sarcoidosis     R/O  . Decreased visual acuity     Left eye  . Hair loss   . Hyperlipidemia   . Pap smear abnormality of cervix with LGSIL   . Type II diabetes mellitus      Assessment & Plan:

## 2010-06-14 NOTE — Assessment & Plan Note (Addendum)
At this point, the patient does not have an acute abdomen, but she does have mild to moderate tenderness of the left lower quadrant.  The differential diagnosis would include ectopic pregnancy as the patient's last menstrual period was February 5, ovarian cyst , constipation , or diverticulitis. The patient is currently afebrile making diverticulitis less likely. At this point in time, I will check a CBC, B. Met, urine  Analysis, pregnancy test, and perform a complete pelvic ultrasound with transvaginal if needed.  If the patient does not have any concerning labs or pathology I will send her home with tramadol for pain and plan to see her back in 1 week.   -The pt was found not to be pregnant, ultrasound was discussed with the radiologist who did not feel that there was any significant pathology other than a right sided ovarian cyst. Labs were unrevealing.

## 2010-06-14 NOTE — Assessment & Plan Note (Addendum)
This is under very poor control hemoglobin A1c was 11.5 today. The patient currently denies any hypoglycemic episodes, so I will increase her Lantus dose from 34, to 36 units daily.  I would like the patient to followup with her PCP in the next month 2 continue to try to improve the patient's diabetes control. I will also schedule the patient to see Jamison Neighbor for education. The patient states that her meter is broken so represcribed her one today.

## 2010-06-14 NOTE — Patient Instructions (Addendum)
Please follow up in 1 week so that we can make sure that your abdominal pain is improving.  If you develop any new symptoms call us or go to the ED.  If you develop constipation, you may take over the counter miralax if needed for constipation.

## 2010-06-14 NOTE — Progress Notes (Signed)
Pt presents w/o appt to front desk c/o Lside abd pain x 2 days, keeping her up last night with no sleep, she has taken an otc laxative that she brought with her, i ask if she felt she was constipated, her spouse translated for her, she states the pain is very bad. She is able to eat and dring, denies N&V. i spoke w/ dr Coralee Pesa and will speak w/ dr Cathey Endow to add to his am schedule

## 2010-06-28 ENCOUNTER — Ambulatory Visit: Payer: Self-pay | Admitting: Internal Medicine

## 2010-07-04 LAB — GLUCOSE, CAPILLARY: Glucose-Capillary: 121 mg/dL — ABNORMAL HIGH (ref 70–99)

## 2010-07-07 LAB — GLUCOSE, CAPILLARY: Glucose-Capillary: 178 mg/dL — ABNORMAL HIGH (ref 70–99)

## 2010-07-08 LAB — GLUCOSE, CAPILLARY: Glucose-Capillary: 303 mg/dL — ABNORMAL HIGH (ref 70–99)

## 2010-07-09 LAB — GLUCOSE, CAPILLARY: Glucose-Capillary: 365 mg/dL — ABNORMAL HIGH (ref 70–99)

## 2010-07-10 LAB — D-DIMER, QUANTITATIVE: D-Dimer, Quant: 0.43 ug{FEU}/mL (ref 0.00–0.48)

## 2010-07-10 LAB — GLUCOSE, CAPILLARY: Glucose-Capillary: 184 mg/dL — ABNORMAL HIGH (ref 70–99)

## 2010-07-13 LAB — GLUCOSE, CAPILLARY

## 2010-07-25 LAB — COMPREHENSIVE METABOLIC PANEL
AST: 15 U/L (ref 0–37)
Alkaline Phosphatase: 112 U/L (ref 39–117)
BUN: 6 mg/dL (ref 6–23)
CO2: 24 mEq/L (ref 19–32)
Chloride: 103 mEq/L (ref 96–112)
GFR calc Af Amer: >60 mL/min (ref >60)
GFR calc non Af Amer: >60 mL/min (ref >60)
Glucose, Bld: 266 mg/dL — ABNORMAL HIGH (ref 70–99)
Potassium: 4.3 mEq/L (ref 3.5–5.1)
Sodium: 136 mEq/L (ref 135–145)
Total Bilirubin: 0.4 mg/dL (ref 0.3–1.2)
Total Protein: 8.1 g/dL (ref 6.0–8.3)

## 2010-07-25 LAB — CBC
HCT: 42.3 % (ref 36.0–46.0)
Hemoglobin: 14.3 g/dL (ref 12.0–15.0)
RDW: 13.6 % (ref 11.5–15.5)

## 2010-07-25 LAB — GC/CHLAMYDIA PROBE AMP, GENITAL
Chlamydia, DNA Probe: NEGATIVE
GC Probe Amp, Genital: NEGATIVE

## 2010-07-25 LAB — POCT URINALYSIS DIP (DEVICE)
Ketones, ur: 80 mg/dL — AB
Protein, ur: 100 mg/dL — AB
Specific Gravity, Urine: 1.02 (ref 1.005–1.030)
pH: 5.5 (ref 5.0–8.0)

## 2010-07-25 LAB — POCT PREGNANCY, URINE: Preg Test, Ur: NEGATIVE

## 2010-07-25 LAB — DIFFERENTIAL
Basophils Absolute: 0 10*3/uL (ref 0.0–0.1)
Basophils Relative: 0 % (ref 0–1)
Neutro Abs: 3.6 10*3/uL (ref 1.7–7.7)
Neutrophils Relative %: 50 % (ref 43–77)

## 2010-07-25 LAB — LIPASE, BLOOD: Lipase: 14 U/L (ref 11–59)

## 2010-07-25 LAB — POCT I-STAT, CHEM 8
HCT: 45 % (ref 36.0–46.0)
Hemoglobin: 15.3 g/dL — ABNORMAL HIGH (ref 12.0–15.0)
Potassium: 4.2 mEq/L (ref 3.5–5.1)
Sodium: 136 mEq/L (ref 135–145)

## 2010-07-25 LAB — URINE CULTURE

## 2010-07-25 LAB — WET PREP, GENITAL: Trich, Wet Prep: NONE SEEN

## 2010-07-26 LAB — POCT URINALYSIS DIP (DEVICE)
Ketones, ur: NEGATIVE mg/dL
Protein, ur: NEGATIVE mg/dL
Specific Gravity, Urine: <=1.005 (ref 1.005–1.030)
Urobilinogen, UA: 0.2 mg/dL (ref 0.0–1.0)

## 2010-07-29 LAB — GLUCOSE, CAPILLARY: Glucose-Capillary: 218 mg/dL — ABNORMAL HIGH (ref 70–99)

## 2010-07-30 LAB — GLUCOSE, CAPILLARY: Glucose-Capillary: 322 mg/dL — ABNORMAL HIGH (ref 70–99)

## 2010-08-01 LAB — GLUCOSE, CAPILLARY: Glucose-Capillary: 305 mg/dL — ABNORMAL HIGH (ref 70–99)

## 2010-09-04 NOTE — Group Therapy Note (Signed)
NAMETAKYLA, KUCHERA NO.:  [id number]   MEDICAL RECORD NO.:  [id number]          PATIENT TYPE:  WOC   LOCATION:  [facility]                   FACILITY:  [facility]   PHYSICIAN:  Allie Bossier, MD        DATE OF BIRTH:  Oct 28, 1970   DATE OF SERVICE:                                  CLINIC NOTE   [name] is a 40 year old married Arabic, poorly-controlled diabetic,  who was sent here for 3 distinct questions.  Her first question was,  merely did she have her tubes tied at the time of her C-section in 2003.  I checked the records, and she did not have her tubes tied.  The second  issue is recurrent candidiasis.  It is clearly stated in the family  practice physician's note that the patient refuses to check her sugars  and is poorly controlled with regard to her diabetes.  She has been  using Diflucan tablets as necessary for recurrent yeast.  I have nothing  to add to this, except again stressed to the patient the importance of  diabetic control.  The third remark on the referral note was a question  of an ulcerated area on the right vaginal wall.  The patient has small  lacerations on her posterior fourchette but she reports having had sex  yesterday.  The remainder of the vaginal wall throughout is normal.   If I can be of any help in the future, I would be happy to.  I  recommended that she use condoms for birth control.      Allie Bossier, MD     MCD/MEDQ  D:  09/17/2007  T:  09/18/2007  Job:  [id number]   cc:   [facility]

## 2010-09-07 NOTE — H&P (Signed)
[facility]  Patient:    Jennifer [name], Jennifer [name] Visit Number: [id number] MRN: [id number]          Service Type: Attending:  Silverio Lay, M.D. Dictated by:   Marcelle Smiling Shaw, C.N.M. Adm. Date:  11/17/01                           History and Physical  DATE OF BIRTH:                1970-09-01  HISTORY OF PRESENT ILLNESS:   The patient is a 40 year old [place] female gravida 7 para 4-1-1-3 at 42 and four-sevenths weeks who presents with uterine contractions every five minutes since 2:45 a.m. She denies vaginal bleeding, leaking, headache, nausea and vomiting, or visual disturbances.  Her pregnancy has been followed by the [facility] M.D. service and has been remarkable for: 1. Diabetes. 2. Previous C section x3. 3. History of preterm delivery at 28 weeks. 4. History of second trimester SAB of twins. 5. Consanguinity. 6. Female circumcision.  PRENATAL LABORATORY DATA:     Collected on July 07, 2001.  Hemoglobin 12.2; hematocrit 38.0; platelets 156,000.  Blood type O positive, antibody negative. RPR nonreactive.  Rubella immune.  Hepatitis B surface antigen negative.  HIV nonreactive.  Varicella negative.  Hemoglobin electrophoresis normal.  On July 27, 2001 her maternal serum alpha-fetoprotein was within normal range.  HISTORY OF PRESENT PREGNANCY:                    She presented for care on July 07, 2001 at 12 weeks and three days with pregnancy ultrasonography performed at that first visit, giving an [facility] of January 16, 2002.  She was scheduled for [facility] appointment to help her care for her diabetes during the pregnancy.  The patient was taking oral hypoglycemic agents prior to the pregnancy and was started on insulin with this pregnancy for management of her diabetes.  Plan was to start nonstress tests two times a week at 28 weeks as well as the plan for a repeat C section was made.   The  patient communicated at her prenatal visits through a translator.  Her insulin was progressively increased throughout the pregnancy to manage her blood sugars. The patient reported during her prenatal visits that at times she is noncompliant with her diet.  Also has stated that she did not know how to give herself her insulin shots.  Pregnancy ultrasonography at approximately [redacted] weeks gestation showed posterior placenta, normal anatomy, normal fluid.  The patient continued on her regular and NPH insulin throughout her prenatal care up to this point.  OBSTETRICAL HISTORY:          She is a gravida 7 para 4-1-1-3.  In April 1987 she vaginally delivered a female infant at full term weighing 7 pounds after 24 hours of labor.  His name is Sherrard.  There were no complications with that pregnancy or birth, which took place in [place].  In January 1989 she vaginally delivered a female infant at [redacted] weeks gestation weighing 4 pounds after 18 hours of labor.  That infant lived two hours and the patient reports no prior complications with that pregnancy.  In August 1991 she had a C section for a female infant at full term weighing 7 pounds.  The C section reason is unknown although there was a vertical skin incision.  That infant died  at the age of two secondary to measles, and this took place in [place] as well.  In October 1993 she had a twin 16-week IUFD delivered vaginally.  In October 1998 she had a C section for a full-term female infant weighing 7 pounds 3 ounces.  She had gestational diabetes with that pregnancy.  The C section was a repeat secondary to prior vertical incision.  This was performed in [place].  In November 1999 she had a scheduled C section for a female infant at full term weighing 7 pounds 1 ounce.  She had gestational diabetes with that pregnancy as well and this took place in [place].  ALLERGIES:                    No medication allergies.  MEDICAL HISTORY:               She reports using oral contraceptives between 1998 and 1999, as well as Micronor while breast feeding.  Her sixth pregnancy was conceived on oral contraceptives.  She has also used Depo-Provera and the rhythm method in the past.  Her last Pap smear was three years ago but she has not had abnormal Pap smears.  She reports no history of chicken pox.  The patient is diabetic controlled by diet as well as oral medications.  She has four maternal uncles with diabetes and her mother is a diabetic as well.  SURGICAL HISTORY:             Remarkable for cesarean section x3, as well as wisdom teeth extraction in December 2002.  FAMILY HISTORY:               Remarkable for mother with chronic hypertension and on medication.  Multiple family members with diabetes.  The father of the baby uses chewing tobacco.  GENETIC HISTORY:              Remarkable for consanguinity.  The patient and the father of the baby are first cousins.  The father-of-the-babys parents are first cousins.  The patients parents are second cousins.  SOCIAL HISTORY:               The patient is married to the father of the baby.  His name is Ahmed.  He is involved and supportive.  They are of the Muslim faith.  They deny any alcohol, tobacco, or illicit drug use with the pregnancy, other than the father-of-the-babys chewing tobacco use.  OBJECTIVE DATA:  VITAL SIGNS:                  Stable.  She is afebrile.  HEENT:                        Grossly within normal limits.  CHEST:                        Clear to auscultation.  HEART:                        Regular to rate and rhythm.  ABDOMEN:                      Gravid in contour with fundal height extending approximately 31 cm above the pubic symphysis.  Electronic fetal monitoring is remarkable for reactive and reassuring fetal heart rate, uterine contractions every three minutes.  PELVIC:  Cervical exam shows external os 2-3 cm, internal os  fingertip, 90% effaced, with a well-developed lower uterine segment, vertex, -2.  EXTREMITIES:                  Within normal limits.   LABORATORY DATA:              Wet prep remarkable for a few clue cells, moderate wbcs, and few bacteria.  Clean catch urine is remarkable for specific gravity 1.015, 500 glucose, 11-20 wbcs, and many bacteria.  ASSESSMENT:                   1. Intrauterine pregnancy at 31 weeks.                               2. Preterm labor.                               3. Bacterial vaginosis.                               4. Possible urinary tract infection.                               5. Diabetic.  PLAN:                         1. Admit to antenatal unit per Dr. Estanislado Pandy.                               2. Begin magnesium sulfate therapy.                               3. Begin betamethasone dosing.                               4. Initiate Glucomander protocol.                               5. Treat urinary tract infection pending                                  culture.                               6. Treat bacterial vaginosis with metronidazole.                               7. Bedside ultrasound to be performed this                                  morning for BPP, growth, fluid, and cervical  length. Dictated by:   Marcelle Smiling Clelia Croft, C.N.M. Attending:  Silverio Lay, M.D. DD:  11/17/01 TD:  11/17/01 Job: [id number]

## 2010-09-07 NOTE — Op Note (Signed)
NAMEBRI, WAKEMAN NO.:  [id number]   MEDICAL RECORD NO.:  [id number]                   PATIENT TYPE:  OUT   LOCATION:  ULT                                  [facility]   PHYSICIAN:  Janine Limbo, M.D.            DATE OF BIRTH:  1971-01-23   DATE OF PROCEDURE:  12/07/2001  DATE OF DISCHARGE:                                 OPERATIVE REPORT   PREOPERATIVE DIAGNOSES:  1. Gestation at 34 weeks.  2. Three prior cesarean sections (one classical cesarean section).  3. Prior female circumcision.  4. Diabetes.  5. Preterm labor.  6. Prolapsed umbilical cord.   POSTOPERATIVE DIAGNOSES:  1. Gestation at 34 weeks.  2. Three prior cesarean sections (one classical cesarean section).  3. Prior female circumcision.  4. Diabetes.  5. Preterm labor.  6. Prolapsed umbilical cord.  7. Pelvic adhesions.   PROCEDURE:  Repeat low transverse cesarean section.   SURGEON:  Janine Limbo, M.D.   FIRST ASSISTANT:  Marie L. Williams, C.N.M.   ANESTHESIA:  General.   INDICATIONS:  The patient is a 40 year old female, para 3-2-1-3, who  presents at 41 weeks' gestation.  She has been followed at [facility].  The pregnancy has been complicated by her  history of diabetes dating prior to her pregnancy.  She also has now had  three cesarean sections, and one of the C-sections was a classical cesarean  section.  The patient presented to the office today complaining of abdominal  pain.  Her cervix was completely dilated, and the fetal head was at a +2  station.  The patient's husband brought the patient to the office but then  had to leave.  The patient was transported immediately to the [facility].  Her IV line was started and preparations were made  for delivery.  The patient was taken to the operating room for delivery.   FINDINGS:  A 5 pound 11 ounce female infant was delivered.  The Apgars were  8  at one minute and 9 at five minutes.  The infant was in a vertex  presentation.  The fallopian tubes and the ovaries were normal.  There was  dense adhesions present inside the abdominal cavity.  The lower uterine  segment was extremely thinned out and it was difficult to distinguish  between the lower uterine segment and the bladder.   DESCRIPTION OF PROCEDURE:  The patient was taken to the operating room where  the patient gave permission to attempt a vaginal delivery even though she  had a classical cesarean section.  Communication with the patient was  difficult because she speaks Arabic.  We were able to find an interpreter.  The patient's husband still had not joined [place] at this time.  The patient's  membranes were ruptured, and there was noted to be initially some bloody  fluid and  then clear fluid was present.  The patient's cervix was noted to  be completely effaced, and the presenting part was at a -2 station.  The  cervix was completely dilated.  The patient was allowed to push, and at that  point the umbilical cord prolapsed into the vagina.  We made plans for a  stat cesarean section.  The labor and delivery nurse then elevated the head  out of the pelvis while I scrubbed and prepped my hands for surgery.  The  patient's abdomen was prepped with multiple layers of Betadine and then she  was sterilely draped.  The patient was given a general anesthetic.  A low  transverse incision was made in the abdomen through the previous incision.  The incision was extended sharply through the subcutaneous tissues, the  fascia and the anterior peritoneum.  Adhesions were encountered as we  dissected into the peritoneal cavity.  The incision was made in the lower  uterine segment and extended transversely.  No adhesions were noted at this  particular layer.  The fetal head was delivered without difficulty.  The  mouth and nose were suctioned.  The remainder of the infant was then   delivered.  The cord was clamped and cut, and the infant was handed to the  awaiting pediatric team.  Routine cord blood studies were obtained.  The  arterial cord blood pH was 7.15.  The placenta was removed.  The uterine  cavity was cleaned of amniotic fluid and clotted blood.  It was difficult to  outline the lower portion of the lower uterine segment because the lower  uterine segment had thinned out significantly and because the bladder was  adhered.  We were able to bluntly dissect the bladder off of the lower  uterine segment.  The low transverse incision was then closed using a  running suture of 2-0 Vicryl.  The pelvic contents were then vigorously  irrigated.  The bladder was filled with sterile milk so that we could  document that the bladder mucosa was intact.  The bladder did fill easily,  and there was no evidence of spill into the peritoneal cavity.  The bladder  was then allowed to drain.  The pelvis was again irrigated.  Hemostasis was  adequate.  The abdominal musculature and the anterior peritoneum were  reapproximated in the midline using 2-0 Vicryl.  The subcutaneous layer and  the fascia were irrigated.  The fascia was closed using a running suture of  0 Vicryl followed by three interrupted sutures of 0 Vicryl.  The  subcutaneous layer was irrigated.  The subcutaneous layer was closed using a  running suture of 2-0 Vicryl.  The skin was reapproximated using a 4-0  subcuticular suture of 4-0 Vicryl.  Sponge, needle and instrument counts  were correct on two occasions.  The estimated blood loss was 1000 cc.  The  patient tolerated her procedure well.  She was taken to the recovery room in  stable condition.  The infant was taken to the full-term nursery in stable  condition.                                               Janine Limbo, M.D.    AVS/MEDQ  D:  12/07/2001  T:  12/08/2001  Job:  [id number]

## 2010-09-07 NOTE — Group Therapy Note (Signed)
NAMESHARAINE, DELANGE NO.:  [id number]   MEDICAL RECORD NO.:  [id number]          PATIENT TYPE:  WOC   LOCATION:  [facility]                   FACILITY:  [facility]   PHYSICIAN:  Kathlyn Sacramento, M.D.   DATE OF BIRTH:  07-03-1970   DATE OF SERVICE:                                    CLINIC NOTE   CHIEF COMPLAINT:  Here for Pap smear.   HISTORY OF PRESENT ILLNESS:  Patient is a 40 year old Arabic patient from  [place] who is here for a Pap smear.  She states that she did present to the  clinic on  March 19, 2005, but was on her period at that time, and returns today,  and is not on her menstrual cycle.  She is not menstruating.  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  She had a Pap  smear done in March of 2006 which was within normal limits, but no  endocervical component.  Patient also complains of burning and pain during  intercourse for about one month.  Patient states that she has had burning  pain with intercourse for the past month to month and a half.  She states  that she noticed the pain and burning afterwards, mostly in the external  genitalia.  She does also have pain with insertion.  Patient states that she  has had burning of her external genitalia intermittently in the past, and  she has had this 3 times.  She states it does get better with cream as well  as the pain with intercourse.   PAST MEDICAL HISTORY:  The patient has diabetes and high cholesterol in the  past.   SOCIAL HISTORY:  Patient lives with her husband and children.   REVIEW OF SYSTEMS:  Numbness and weakness in fingers, occasional fatigue,  occasional weight gain, frequent headaches, dizzy spells, problems hearing,  problems with vision, sometimes problems with nose, sometimes problems with  cough and phlegm, sometimes problems with urination, sometimes occasional  odor and occasional vaginal itching and occasional pain with intercourse.   MENSTRUAL HISTORY:  There are 25 days between cycles.  Periods last 5 days,  medium consistency.  Pain with periods occasionally.  No bleeding between  periods.  Menstrual cycle began at age 73.  They are regular.   CONTRACEPTIVE HISTORY:  Yasmin.   OBSTETRICAL HISTORY:  Eight pregnancies, 4 living children, 1 child died at  7 months while the patient was pregnant, and the other 3 children died after  birth.   PAST SURGICAL HISTORY:  Four C sections.   FAMILY HISTORY:  Significant for diabetes in her mother, blood clots in her  mother.   ALLERGIES:  No known drug allergies.  Last menstrual period April 16, 2005.   MEDICATIONS:  Metformin, glipizide, Yasmin.   PHYSICAL EXAMINATION:  VITAL SIGNS:  Temp 97.6, pulse 81, blood pressure of  122/82, weight 201.1, height 5 feet 4 inches.  GENERAL:  Well-developed, well-nourished female in no acute distress.  BREASTS:  The patient had no axillary or subclavicular lymphadenopathy.  She  did  have a palpable cystic-feeling mass in her right breast approximately  3.0-4.0 cm in size at 12 o'clock.  She also had a palpable mass in her left  breast at 1-2 o'clock of about 2.0-3.0 cm of a cystic consistency.  GU:  Patient's external genitalia was normal with the exception of she has  had a female circumcision, and does not have a clitoris.  She also had  hypopigmented skin with satellite lesions consistent with use.  Vaginal  mucosa was pink and rugated.  She had a parous cervix.  A Pap smear was  taken.   IMPRESSION:  1.  Dyspareunia.  2.  Candidiasis.  3.  Breast mass.  4.  History of abnormal Pap smear.   PLAN:  1.  For her dyspareunia - most likely secondary to her Candida fungal      infection of her external genitalia.  2.  Candidiasis - prescription for nystatin cream was written to use b.i.d.,      240 gm with no refills.  The patient was encouraged to dry off well      after showering, to wear cotton undergarments and to keep  her diabetes      under good control.  3.  Breast mass - patient has been scheduled for a mammogram under the      breast cancer scholarship if indicated after the results of the      mammogram are seen.  4.  History of abnormal Pap smear - a Pap smear was done today.           ______________________________  Kathlyn Sacramento, M.D.     AC/MEDQ  D:  04/25/2005  T:  04/25/2005  Job:  [id number]

## 2010-09-07 NOTE — Discharge Summary (Signed)
Jennifer [name], Jennifer [name]                          ACCOUNT NO.:  [id number]   MEDICAL RECORD NO.:  [id number]                   PATIENT TYPE:  INP   LOCATION:  9157                                 [facility]   PHYSICIAN:  Marie L. Williams, C.N.M.           DATE OF BIRTH:  12-01-1970   DATE OF ADMISSION:  11/17/2001  DATE OF DISCHARGE:  11/20/2001                                 DISCHARGE SUMMARY   ADMISSION DIAGNOSES:  1. Intrauterine pregnancy at 31 weeks.  2. Preterm labor.  3. Bacterial vaginosis.  4. Questionable urinary tract infection.  5. Insulin-dependent gestational diabetes.  6. History of cesarean section x3.   DISCHARGE DIAGNOSES:  1. Intrauterine pregnancy at 31 weeks.  2. Preterm labor.  3. Bacterial vaginosis.  4. Questionable urinary tract infection.  5. Insulin-dependent gestational diabetes.  6. History of cesarean section x3.   HOSPITAL COURSE:  The patient was admitted at 31-4/7 weeks with uterine  contractions every 5 minutes.  She did not have bleeding or leaking at that  time.  Upon admission, fetal heart rate per electronic monitor was found to  be reassuring with no decelerations.  There were irregular uterine  contractions observed, some as close as every three minutes.  Cervical exam  was 2-3 cm on the external os, fingertip dilation on the internal os, 90%  effaced, -2 station, with a vertex presentation.  The patient was,  therefore, admitted for magnesium sulfate tocolysis, betamethasone  administration, and blood sugar control.  Urine was sent to culture, and the  patient was placed on Flagyl for treatment of bacterial vaginosis and  Macrobid for treatment of possible UTI.   On her first day, she began to increase her contractions despite magnesium  sulfate tocolysis, and magnesium sulfate was increased to 3 gm per hour.  She received two doses of betamethasone over the next 24 hours.  Her  Glucomander was per protocol for control of her  blood glucoses.  Ultrasound  was performed, which did not identify remaining increased flow over ductal  arch.  However, this ultrasound was a limited study.   On the second hospital day, the patient decreased her contractions.  Vital  signs remained stable.  Hemoglobin A1c was 8.7.  Capillary blood glucoses  ranged from 140-170.  Fasting blood sugars were in the 80s.   On the third hospital day, the patient's capillary blood glucoses had ranged  from 54-124 through the night.  Insulin control was monitored by the  Glucomander.  She continued to have mild uterine irritability, but magnesium  sulfate was able to be discontinued.  Fetal heart rate remained reassuring  throughout.  On 11/20/01, she had continued to do well off of her magnesium  sulfate with no further preterm contractions.  There was occasional  irritability, and fetal heart rate remained reassuring. Vaginal exam  remained unchanged.  A 24 hours' worth of Glucomander required 141 units of  insulin, but she was currently on the preadmission regimen of 62 units.  Fasting blood sugar was 140 with a two hour postprandial of 203.   The patient was deemed to have received full benefit of her hospital stay  and was discharged home to return in five days for further management of her  diabetes.  Her insulin regime was adjusted and increased to 20 units of  regular Humulin in the morning and 18 units in the evening, and 20 units of  NPH Humulin in the morning and 18 units in the evening.  The patient was  advised to return to the office on August 6, with appointment with Dr.  Estanislado Pandy to further monitor and manage her diabetes.   DISCHARGE MEDICATIONS:  1. Regular and Humulin insulin as ordered.  2. Macrobid one p.o. b.i.d. x7 days.  3. Metronidazole 250 p.o. t.i.d. x7 days.   DISCHARGE LABORATORY DATA:  Urine culture showed 100,000 colonies of  Lactobacilli.  Wet prep showed positive clue cells.  Group B Strep pending.  Her  gonorrhea and Chlamydia were both negative.  RPR was nonreactive.   DISCHARGE INSTRUCTIONS:  Per Dr. Estanislado Pandy.   DISCHARGE FOLLOWUP:  On November 25, 2001 at 1:30 with Dr. Estanislado Pandy or p.r.n.                                               Marie L. Mayford Knife, C.N.M.    MLW/MEDQ  D:  11/20/2001  T:  11/27/2001  Job:  [id number]   cc:   Dois Davenport A. Rivard, M.D.

## 2010-09-07 NOTE — Discharge Summary (Signed)
NAMETEREASE, [name] NO.:  [id number]   MEDICAL RECORD NO.:  [id number]                   PATIENT TYPE:  OUT   LOCATION:  ULT                                  [facility]   PHYSICIAN:  Janine Limbo, M.D.            DATE OF BIRTH:  1970-10-10   DATE OF ADMISSION:  12/07/2001  DATE OF DISCHARGE:  12/10/2001                                 DISCHARGE SUMMARY   ADMISSION DIAGNOSES:  1. Intrauterine pregnancy at 34 weeks.  2. Preterm labor.  3. Three prior cesarean sections (one classical).  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  5. Diabetes.  6. Prolapsed cord.  7. Pelvic adhesions.   DISCHARGE DIAGNOSES:  1. Intrauterine pregnancy at 34 weeks.  2. Preterm labor.  3. Three prior cesarean sections (one classical).  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  5. Diabetes.  6. Prolapsed cord.  7. Pelvic adhesions.   HOSPITAL PROCEDURES:  1. General anesthesia.  2. Repeat low transverse cesarean section.   HOSPITAL COURSE:  The patient was admitted from the office completely  dilated.  She had a history of three prior cesarean sections, one being a  classical incision.  Efforts were made toward vaginal delivery; however,  upon rupture of membranes umbilical cord prolapsed and decision was made to  proceed with emergency repeat cesarean section.  This was performed under  general anesthesia by Dr. Stefano Gaul for a viable female infant at 5 pounds 11  ounces, Apgars 8 and 9.  EBL was 1000 cc and patient was taken to recovery  and mother/baby unit where she did well during her recovery time.  Vital  signs remained stable.  Hemoglobin was 8.6 but the patient ambulated well  without assistance.  She was breast-feeding.  Abdomen was soft,  appropriately tender.  Incision remained clean, dry, and intact.  On  postoperative day number one patient was requiring sliding scale insulin to  maintain adequate capillary blood glucoses.  On postoperative day number two  she was doing  well with blood glucoses ranging from 172-265 post prandial  with insulin coverage.  Postoperative day number three vital signs were  stable, she was afebrile.  Fasting blood sugar was 126.  Two hour  postprandial were 216 and 153.  Physical examination was within normal  limits.  Incision was clean, dry, and intact.  Lochia was small, moderate.  Extremities within normal limits.  She was deemed to have received the full  benefit of her hospital stay and was discharged home.   DISCHARGE LABORATORIES:  Blood glucose as listed previously.  White blood  cell count 6.2, hemoglobin 8.6, hematocrit 26.0, platelets 142,000.  RPR  nonreactive.   DISCHARGE MEDICATIONS:  1. Tylox one to two p.o. q.4h. p.r.n.  2. Motrin 600 mg p.o. q.6h. p.r.n.  3. Regular insulin 10 units in the morning and 4 units at night.  4. NPH insulin 10 units in the morning and 8 units  at night.   DISCHARGE FOLLOWUP:  Within one week at [facility] and six weeks  at [facility] or p.r.n. as indicated.     Marie L. Williams, C.N.M.                 Janine Limbo, M.D.    MLW/MEDQ  D:  12/10/2001  T:  12/10/2001  Job:  [id number]

## 2010-09-11 ENCOUNTER — Encounter: Payer: Medicaid Other | Admitting: Internal Medicine

## 2010-09-11 ENCOUNTER — Ambulatory Visit: Payer: Medicaid Other | Admitting: Dietician

## 2010-12-03 ENCOUNTER — Ambulatory Visit (INDEPENDENT_AMBULATORY_CARE_PROVIDER_SITE_OTHER): Payer: Medicaid Other | Admitting: Internal Medicine

## 2010-12-03 ENCOUNTER — Encounter: Payer: Self-pay | Admitting: Internal Medicine

## 2010-12-03 ENCOUNTER — Encounter: Payer: Self-pay | Admitting: *Deleted

## 2010-12-03 VITALS — BP 96/64 | HR 83 | Temp 98.6°F | Ht 64.0 in | Wt 191.2 lb

## 2010-12-03 DIAGNOSIS — E119 Type 2 diabetes mellitus without complications: Secondary | ICD-10-CM

## 2010-12-03 DIAGNOSIS — L0292 Furuncle, unspecified: Secondary | ICD-10-CM

## 2010-12-03 DIAGNOSIS — B372 Candidiasis of skin and nail: Secondary | ICD-10-CM

## 2010-12-03 DIAGNOSIS — R10814 Left lower quadrant abdominal tenderness: Secondary | ICD-10-CM

## 2010-12-03 DIAGNOSIS — E785 Hyperlipidemia, unspecified: Secondary | ICD-10-CM

## 2010-12-03 LAB — COMPREHENSIVE METABOLIC PANEL
ALT: <8 U/L (ref 0–35)
CO2: 25 mEq/L (ref 19–32)
Calcium: 8.7 mg/dL (ref 8.4–10.5)
Chloride: 102 mEq/L (ref 96–112)
Creat: 0.48 mg/dL — ABNORMAL LOW (ref 0.50–1.10)
Glucose, Bld: 249 mg/dL — ABNORMAL HIGH (ref 70–99)

## 2010-12-03 LAB — POCT GLYCOSYLATED HEMOGLOBIN (HGB A1C): Hemoglobin A1C: 10.5

## 2010-12-03 LAB — LIPID PANEL
Cholesterol: 184 mg/dL (ref 0–200)
LDL Cholesterol: 115 mg/dL — ABNORMAL HIGH (ref 0–99)
Triglycerides: 72 mg/dL (ref <150)

## 2010-12-03 LAB — GLUCOSE, CAPILLARY: Glucose-Capillary: 244 mg/dL — ABNORMAL HIGH (ref 70–99)

## 2010-12-03 MED ORDER — METFORMIN HCL 1000 MG PO TABS: 1000.0000 mg | ORAL_TABLET | Freq: Two times a day (BID) | ORAL | Status: DC

## 2010-12-03 MED ORDER — PREDNISOLONE ACETATE 1 % OP SUSP: 1.0000 [drp] | Freq: Every day | OPHTHALMIC | Status: DC

## 2010-12-03 MED ORDER — INSULIN GLARGINE 100 UNIT/ML ~~LOC~~ SOLN: SUBCUTANEOUS | Status: DC

## 2010-12-03 MED ORDER — GLUCOSE BLOOD VI STRP: ORAL_STRIP | Status: DC

## 2010-12-03 MED ORDER — BRIMONIDINE TARTRATE-TIMOLOL 0.2-0.5 % OP SOLN: 1.0000 [drp] | Freq: Two times a day (BID) | OPHTHALMIC | Status: DC

## 2010-12-03 MED ORDER — ATROPINE SULFATE 1 % OP SOLN: 1.0000 [drp] | Freq: Two times a day (BID) | OPHTHALMIC | Status: DC

## 2010-12-03 MED ORDER — CAPSAICIN 0.075 % EX CREA: TOPICAL_CREAM | Freq: Three times a day (TID) | CUTANEOUS | Status: DC

## 2010-12-03 MED ORDER — INSULIN PEN NEEDLE 31G X 8 MM MISC: 1.0000 | Freq: Two times a day (BID) | Status: DC

## 2010-12-03 MED ORDER — ACCU-CHEK SOFT TOUCH LANCETS MISC: Status: DC

## 2010-12-03 MED ORDER — NYSTATIN 100000 UNIT/GM EX CREA: TOPICAL_CREAM | CUTANEOUS | Status: DC | PRN

## 2010-12-03 MED ORDER — INSULIN GLARGINE 100 UNIT/ML ~~LOC~~ SOLN: 36.0000 [IU] | Freq: Every day | SUBCUTANEOUS | Status: DC

## 2010-12-03 NOTE — Progress Notes (Unsigned)
Pt presents w/ daughter and spouse. Pt c/o lesion under R breast x 3 days, size of quarter, draining white thick, very painful. Will see dr Aldine Contes at [phone]

## 2010-12-03 NOTE — Assessment & Plan Note (Signed)
Consistent with breast boil. I advised pt to apply warm compresses and abx Bactrim DS for 7 days. She was advised to come back for follow up in 1-2 weeks.

## 2010-12-03 NOTE — Assessment & Plan Note (Signed)
Uncontrolled diabetes secondary to medical noncompliance which is most likely secondary to lack of resources, financial and transportation. A1c is 10.5 on today's visit and patient reports only taking Lantus twice daily which is different from recommended dosing. She is not taking any prandial coverage insulin as recommended on prior visits nor she is taking metformin as recommended. In addition she reports not being compliant with the recommended diet and exercise. She has also not checked her sugar levels over 6 months he goes she did not have available strips and other testing supplies. Patient's husband is present in the room and information was discussed with the family. I have explained the importance of proper compliance with diabetes and medications. I have also explained the importance of checking blood sugar levels regularly every day 2-3 times per day as well as drinking to monitor it to every visit so that we can reevaluate it and we adjusted medication regimen if indicated. Today I have provided education on insulin and metformin, and sharing this patient is familiar with names and dosing. Family has verbalized understanding and patient expressed confidence she can continue checking sugar levels and control her diet in addition to taking metformin and insulin. I have simplified her regimen for patient and continued only Lantus 40 units in the morning and 30 units in the evening along with metformin thousand milligram tablet twice daily.

## 2010-12-03 NOTE — Assessment & Plan Note (Signed)
Will check FLP today as well as liver function test and will readjust the medication regimen if indicated. Diet and exercise.

## 2010-12-03 NOTE — Progress Notes (Signed)
  Subjective:    [name], female    DOB: 22-Oct-1970, 40 y.o.   MRN: [id number]  HPI  Patient is a 40 year old female with past medical history outlined below who presents to clinic with main concern of the boil under the right breast that appeared 4 days ago and has been draining pus. She denies any bloody discharge from the boil. Blood is getting bigger in size in its more tender to touch. She denies noticing any other boils on the rest of the body, no fevers or chills, no systemic symptoms of weight loss or night sweats. She reports episodes of boils in the past under the bilateral armpits but the last one occurred about one year ago. She has not tried any over-the-counter preparation. In addition patient reports not having any medications for approximately 6 months, except lantus. Tells me that her husband has left the country and she was not able to pain medications due to lack of resources, financial and transportation. She would like to have refills.  Review of Systems    per HPI Objective:   Physical Exam  Constitutional: Vital signs reviewed.  Patient is a well-developed and well-nourished in no acute distress and cooperative with exam. Alert and oriented x3.  Head: Normocephalic and atraumatic Ear: TM normal bilaterally Mouth: no erythema or exudates, MMM Eyes: PERRL, EOMI, conjunctivae normal, No scleral icterus.  Neck: Supple, Trachea midline normal ROM, No JVD, mass, thyromegaly, or carotid bruit present.  Chest: 1 cm round boil noted right below the right wrist area on the lateral side, mid axillary line, small amount of pus drained from the boil, slightly tender to palpation with a small amount of surrounding erythema. No boils noted on the other breast Cardiovascular: RRR, S1 normal, S2 normal, no MRG, pulses symmetric and intact bilaterally Pulmonary/Chest: CTAB, no wheezes, rales, or rhonchi Abdominal: Soft. Non-tender, non-distended, bowel sounds are normal,  no masses, organomegaly, or guarding present.          Assessment & Plan:

## 2010-12-10 ENCOUNTER — Ambulatory Visit: Payer: Self-pay | Admitting: Internal Medicine

## 2010-12-17 ENCOUNTER — Ambulatory Visit (INDEPENDENT_AMBULATORY_CARE_PROVIDER_SITE_OTHER): Payer: Self-pay | Admitting: Internal Medicine

## 2010-12-17 ENCOUNTER — Encounter: Payer: Self-pay | Admitting: Internal Medicine

## 2010-12-17 VITALS — BP 116/76 | HR 84 | Temp 97.6°F | Wt 189.4 lb

## 2010-12-17 MED ORDER — INSULIN NPH ISOPHANE & REGULAR (70-30) 100 UNIT/ML ~~LOC~~ SUSP: 25.0000 [IU] | Freq: Two times a day (BID) | SUBCUTANEOUS | Status: DC

## 2010-12-17 NOTE — Progress Notes (Signed)
  Subjective:    [name], female    DOB: December 12, 1970, 40 y.o.   MRN: [id number]  HPI  Patient is 40 year old female with past medical history outlined below who presents to clinic for followup of diabetes. She tells me she has not been taking Lantus or metformin because she cannot afford the medications at this time. She denies abdominal or urinary concerns, no fevers chills, no chest pain there  Review of Systems Per history of present illness    Objective:   Physical Exam  Constitutional: Vital signs reviewed.  Patient is a well-developed and well-nourished in no acute distress and cooperative with exam. Alert and oriented x3.  Cardiovascular: RRR, S1 normal, S2 normal, no MRG, pulses symmetric and intact bilaterally Pulmonary/Chest: CTAB, no wheezes, rales, or rhonchi Abdominal: Soft. Non-tender, non-distended, bowel sounds are normal, no masses, organomegaly, or guarding present.  Psychiatric: Normal mood and affect. speech and behavior is normal. Judgment and thought content normal. Cognition and memory are normal.         Assessment & Plan:

## 2010-12-17 NOTE — Assessment & Plan Note (Signed)
Uncontrolled diabetes and secondary to medical noncompliance which is secondary to financial difficulties. We have discussed importance of compliance with recommended regimen and for this reason we will change medication regimen to Humulin 70/30 2 times daily, 25 units twice a day. Patient has interpreter in the room and verbalizes understanding. We will provide necessary equipment for her insulin administration. Patient was advised to continue checking blood glucose levels regularly and to call affected the numbers are consistently higher than 350. I have also advised her to come back in 2 weeks for evaluation of glucose control.

## 2011-01-09 ENCOUNTER — Ambulatory Visit: Payer: Self-pay | Admitting: Internal Medicine

## 2011-01-21 LAB — GLUCOSE, CAPILLARY: Glucose-Capillary: 199 — ABNORMAL HIGH

## 2011-01-31 LAB — URINE MICROSCOPIC-ADD ON

## 2011-01-31 LAB — I-STAT 8, (EC8 V) (CONVERTED LAB)
BUN: 5 — ABNORMAL LOW
Bicarbonate: 27.6 — ABNORMAL HIGH
Chloride: 103
HCT: 38
Hemoglobin: 12.9
Operator id: 279831
Potassium: 4.5
Sodium: 135

## 2011-01-31 LAB — URINE CULTURE

## 2011-01-31 LAB — URINALYSIS, ROUTINE W REFLEX MICROSCOPIC
Bilirubin Urine: NEGATIVE
Nitrite: NEGATIVE
Specific Gravity, Urine: 1.012
pH: 8.5 — ABNORMAL HIGH

## 2011-02-07 LAB — URINALYSIS, ROUTINE W REFLEX MICROSCOPIC
Ketones, ur: NEGATIVE
Nitrite: NEGATIVE
pH: 5

## 2011-02-07 LAB — URINE MICROSCOPIC-ADD ON

## 2011-02-07 LAB — POCT PREGNANCY, URINE
Operator id: 113551
Preg Test, Ur: NEGATIVE

## 2011-02-19 ENCOUNTER — Other Ambulatory Visit: Payer: Self-pay | Admitting: *Deleted

## 2011-02-19 DIAGNOSIS — E119 Type 2 diabetes mellitus without complications: Secondary | ICD-10-CM

## 2011-02-19 MED ORDER — INSULIN GLARGINE 100 UNIT/ML ~~LOC~~ SOLN: 37.0000 [IU] | Freq: Every day | SUBCUTANEOUS | Status: DC

## 2011-02-19 MED ORDER — "INSULIN SYRINGE-NEEDLE U-100 31G X 5/16"" 0.5 ML MISC": 1.0000 | Freq: Every day | Status: DC

## 2011-02-19 NOTE — Telephone Encounter (Signed)
Rx was filled in yesterday. Thank you.

## 2011-02-19 NOTE — Telephone Encounter (Signed)
Pt needs a refill on her Lantus Solo Star  Flex-Pen.  Needs to go tot that [employer] on [address].  Angelina Ok, RN 02/19/2011 3:00 PM

## 2011-02-26 ENCOUNTER — Other Ambulatory Visit: Payer: Self-pay | Admitting: Internal Medicine

## 2011-02-26 ENCOUNTER — Ambulatory Visit (INDEPENDENT_AMBULATORY_CARE_PROVIDER_SITE_OTHER): Payer: Medicaid Other | Admitting: Internal Medicine

## 2011-02-26 ENCOUNTER — Encounter: Payer: Self-pay | Admitting: Internal Medicine

## 2011-02-26 VITALS — BP 112/72 | HR 96 | Temp 96.9°F | Ht 60.0 in | Wt 181.4 lb

## 2011-02-26 DIAGNOSIS — Z23 Encounter for immunization: Secondary | ICD-10-CM

## 2011-02-26 DIAGNOSIS — R300 Dysuria: Secondary | ICD-10-CM

## 2011-02-26 DIAGNOSIS — Z0000 Encounter for general adult medical examination without abnormal findings: Secondary | ICD-10-CM | POA: Insufficient documentation

## 2011-02-26 DIAGNOSIS — E785 Hyperlipidemia, unspecified: Secondary | ICD-10-CM

## 2011-02-26 LAB — POCT URINALYSIS DIPSTICK
Bilirubin, UA: NEGATIVE
Glucose, UA: >=1000
Nitrite, UA: NEGATIVE
Urobilinogen, UA: 0.2
pH, UA: 5

## 2011-02-26 LAB — URINALYSIS, ROUTINE W REFLEX MICROSCOPIC
Bilirubin Urine: NEGATIVE
Nitrite: NEGATIVE
Specific Gravity, Urine: 1.042 — ABNORMAL HIGH (ref 1.005–1.030)
Urobilinogen, UA: 0.2 mg/dL (ref 0.0–1.0)

## 2011-02-26 LAB — COMPLETE METABOLIC PANEL WITH GFR
ALT: 8 U/L (ref 0–35)
CO2: 23 mEq/L (ref 19–32)
Calcium: 9.2 mg/dL (ref 8.4–10.5)
Chloride: 97 mEq/L (ref 96–112)
GFR, Est African American: >89 mL/min (ref >89)
Potassium: 3.7 mEq/L (ref 3.5–5.3)
Sodium: 131 mEq/L — ABNORMAL LOW (ref 135–145)
Total Protein: 7.9 g/dL (ref 6.0–8.3)

## 2011-02-26 LAB — POCT URINE PREGNANCY: Preg Test, Ur: NEGATIVE

## 2011-02-26 LAB — URINALYSIS, MICROSCOPIC ONLY

## 2011-02-26 MED ORDER — INSULIN GLARGINE 100 UNIT/ML ~~LOC~~ SOLN: 40.0000 [IU] | Freq: Two times a day (BID) | SUBCUTANEOUS | Status: DC

## 2011-02-26 MED ORDER — PNEUMOCOCCAL VAC POLYVALENT 25 MCG/0.5ML IJ INJ: 0.5000 mL | INJECTION | Freq: Once | INTRAMUSCULAR | Status: DC

## 2011-02-26 MED ORDER — PRAVASTATIN SODIUM 40 MG PO TABS: 40.0000 mg | ORAL_TABLET | Freq: Every evening | ORAL | Status: DC

## 2011-02-26 MED ORDER — SULFAMETHOXAZOLE-TRIMETHOPRIM 800-160 MG PO TABS: 1.0000 | ORAL_TABLET | Freq: Two times a day (BID) | ORAL | Status: AC

## 2011-02-26 MED ORDER — INSULIN ASPART 100 UNIT/ML ~~LOC~~ SOLN: SUBCUTANEOUS | Status: DC

## 2011-02-26 NOTE — Assessment & Plan Note (Signed)
LDL slightly above goal at 115. Will start statin medication today. Patient has financial difficulty, thus will start Pravastatin 40 mg po qhs.

## 2011-02-26 NOTE — Patient Instructions (Addendum)
Please follow up in next 2-4 weeks for PAP smear.  Please take all medications as prescribed. Please start taking regular insulin 8 units before meals. Please schedule an appointment with the diabetes educator.  Please take antibiotic for 3 days until completed.  Please start taking cholesterol medication at bedtime. Please increase insulin to 40 units twice daily and make sure you check your blood sugars at least 3 times a day, early in the morning before breakfast, lunch and supper.

## 2011-02-26 NOTE — Assessment & Plan Note (Addendum)
Lab Results  Component Value Date   HGBA1C 13.2 02/26/2011   HGBA1C 10.3 01/31/2010   CREATININE 0.48* 12/03/2010   CREATININE 0.58 07/18/2009   MICROALBUR 4.31* 01/31/2010   MICRALBCREAT 22.8 01/31/2010   CHOL 184 12/03/2010   HDL 55 12/03/2010   TRIG 72 12/03/2010    Last eye exam and foot exam:    Component Value Date/Time   HMDIABEYEEXA appt. w/Dr. Lottie Dawson at [facility] 09/13/09 08/29/2009   HMDIABFOOTEX done 04/24/2009    Assessment: Diabetes control: not controlled Progress toward goals: deteriorated Barriers to meeting goals: language and lack of understanding of disease management  Plan: Diabetes treatment: Will start patient on SSI and increase lantus 40 units BID.  Refer to: diabetes educator for self-management training, diabetes educator for medical nutrition therapy and nutritionist Instruction/counseling given: reminded to get eye exam, reminded to bring blood glucose meter & log to each visit, reminded to bring medications to each visit, discussed the need for weight loss and discussed diet  Eye exam scheduled for 03/22/2011.

## 2011-02-26 NOTE — Progress Notes (Signed)
Addended by: Youlanda Roys A on: 02/26/2011 11:41 AM   Modules accepted: Orders

## 2011-02-26 NOTE — Progress Notes (Signed)
Addended by: Youlanda Roys A on: 02/26/2011 11:48 AM   Modules accepted: Orders

## 2011-02-26 NOTE — Assessment & Plan Note (Signed)
Trace leukocytes on dipstick, nitrite negative. Given positive symptoms of dysuria, urgency, and frequency will treat with Bactrim for 3 days and follow up with U/A and Urine Cx.

## 2011-02-26 NOTE — Progress Notes (Signed)
Addended by: Bufford Spikes on: 02/26/2011 10:40 AM   Modules accepted: Orders

## 2011-02-26 NOTE — Progress Notes (Signed)
  Subjective:    [name], female    DOB: 1970-08-12, 40 y.o.   MRN: [id number]  HPI  Ms. Tarazon is a 40 year old woman with pmh significant for DM, HLD who presents for general follow-up.  Additionally she complains of the following:  1.) Burning on urination - started 3 days ago, complains of urgency and frequency. No blood noted. LMP Sept. 28th, 2012. She states her periods are somewhat irregular and sometimes she will not have a period until 2 months later.    2.) Pregnancy test - states she is having unprotected sex and would like pregnancy test.  3.) Flu shot   4.) DM - States her meter does not work so she cannot check her blood sugars. She takes 34 units twice daily and never misses any doses. She states that one hour after taking the insulin her blood sugar will drop to 140 which is low for her. She denies any complaints related to hyperglycemia such as N/V/Polyurua/polydipsia or any complaints related to hypoglycemia such as dizziness or lightheadedness.   No other complaints or concerns today.   Review of Systems  All other systems reviewed and are negative.       Objective:   Physical Exam  Constitutional: She appears well-developed.  HENT:  Head: Normocephalic.  Eyes: Pupils are equal, round, and reactive to light.  Neck: Normal range of motion.  Cardiovascular: Normal rate and regular rhythm.   Pulmonary/Chest: Effort normal and breath sounds normal.  Abdominal: Soft. Bowel sounds are normal.       No suprapubic tenderness  Musculoskeletal: Normal range of motion.          Assessment & Plan:

## 2011-02-26 NOTE — Assessment & Plan Note (Signed)
Flu and PNA shot today Will follow up in 2-4 weeks for PAP. Eye exam scheduled.

## 2011-02-28 LAB — URINE CULTURE: Colony Count: >=100000

## 2011-03-18 ENCOUNTER — Other Ambulatory Visit: Payer: Self-pay | Admitting: *Deleted

## 2011-03-18 DIAGNOSIS — B372 Candidiasis of skin and nail: Secondary | ICD-10-CM

## 2011-03-18 DIAGNOSIS — R10814 Left lower quadrant abdominal tenderness: Secondary | ICD-10-CM

## 2011-03-18 MED ORDER — NYSTATIN 100000 UNIT/GM EX CREA: TOPICAL_CREAM | CUTANEOUS | Status: DC | PRN

## 2011-03-18 MED ORDER — GLUCOSE BLOOD VI STRP: ORAL_STRIP | Status: DC

## 2011-03-18 MED ORDER — ACCU-CHEK SOFT TOUCH LANCETS MISC: Status: DC

## 2011-03-19 NOTE — Telephone Encounter (Signed)
3 rx's called in

## 2011-03-26 ENCOUNTER — Encounter: Payer: Self-pay | Admitting: Internal Medicine

## 2011-03-26 ENCOUNTER — Ambulatory Visit (INDEPENDENT_AMBULATORY_CARE_PROVIDER_SITE_OTHER): Payer: Medicaid Other | Admitting: Internal Medicine

## 2011-03-26 DIAGNOSIS — B372 Candidiasis of skin and nail: Secondary | ICD-10-CM

## 2011-03-26 DIAGNOSIS — E119 Type 2 diabetes mellitus without complications: Secondary | ICD-10-CM

## 2011-03-26 DIAGNOSIS — R10814 Left lower quadrant abdominal tenderness: Secondary | ICD-10-CM

## 2011-03-26 LAB — GLUCOSE, CAPILLARY

## 2011-03-26 MED ORDER — NYSTATIN 100000 UNIT/GM EX CREA: TOPICAL_CREAM | CUTANEOUS | Status: DC | PRN

## 2011-03-26 NOTE — Patient Instructions (Signed)
Please, pick up your prescriptions at the pharmacy. Please, call with any questions and follow up with [place] in 8-12 weeks.

## 2011-03-27 ENCOUNTER — Encounter: Payer: Self-pay | Admitting: Internal Medicine

## 2011-03-27 NOTE — Progress Notes (Deleted)
  Subjective:    [name], female    DOB: [birth year], 40 y.o.   MRN: [id number]  HPI    Review of Systems     Objective:   Physical Exam        Assessment & Plan:

## 2011-03-27 NOTE — Progress Notes (Signed)
Subjective:   [name] female   DOB: [birth year] 40 y.o.   MRN: [id number]  HPI: [name] is a 40 y.o.  Woman is here "just for refills." States that her pharmacy "sent her to her doctor." Denies any complaints.    Past Medical History  Diagnosis Date  . Dysuria   . Candidiasis, skin or nails   . Routine/ritual circumcision   . Anemia, iron deficiency   . Glaucoma associated with ocular inflammation   . Uveitis   . Sarcoidosis     R/O  . Decreased visual acuity     Left eye  . Hair loss   . Hyperlipidemia   . Pap smear abnormality of cervix with LGSIL   . Type II diabetes mellitus    Current Outpatient Prescriptions  Medication Sig Dispense Refill  . atropine 1 % ophthalmic solution Place 1 drop into both eyes 2 (two) times daily. Prescribed by ophthalmologist at [facility]  2 mL  11  . brimonidine-timolol (COMBIGAN) 0.2-0.5 % ophthalmic solution Place 1 drop into both eyes 2 (two) times daily. To affected eye  10 mL  11  . glucose blood (ACCU-CHEK ADVANTAGE TEST) test strip Check your blood sugar 2 times a day before meals or as directed  100 each  11  . insulin aspart (NOVOLOG) 100 UNIT/ML injection Inject 8 units [place] before meals, three times daily.  10 mL  11  . insulin glargine (LANTUS) 100 UNIT/ML injection Inject 40 Units into the skin 2 (two) times daily.  10 mL  11  . Insulin Pen Needle (SURE COMFORT PEN NEEDLES) 31G X 8 MM MISC Inject 1 Container into the skin 2 (two) times daily. Use with insulin pen as directed  100 each  11  . Insulin Syringe-Needle U-100 (PRODIGY INSULIN SYRINGE) 31G X 5/16" 0.5 ML MISC Inject 1 Device into the skin at bedtime. Use to inject insulin once daily  5 each  11  . Lancets (ACCU-CHEK SOFT TOUCH) lancets Check your blood glucose 2 times a day before meals  100 each  11  . nystatin cream (MYCOSTATIN) Apply topically as needed for dry skin. 100000 unit/GM; apply to affected area twice a day as needed  30 g  11  .  pravastatin (PRAVACHOL) 40 MG tablet Take 1 tablet (40 mg total) by mouth every evening.  30 tablet  11  . prednisoLONE acetate (PRED FORTE) 1 % ophthalmic suspension Place 1 drop into both eyes daily.  10 mL  11  . DISCONTD: insulin glargine (LANTUS SOLOSTAR) 100 UNIT/ML injection Inject 37 Units into the skin at bedtime.  10 mL  12   Current Facility-Administered Medications  Medication Dose Route Frequency Provider Last Rate Last Dose  . pneumococcal 23 valent vaccine (PNU-IMMUNE) injection 0.5 mL  0.5 mL Intramuscular Once Amanjot Sidhu       No family history on file. History   Social History  . Marital Status: Married    Spouse Name: N/A    Number of Children: N/A  . Years of Education: N/A   Social History Main Topics  . Smoking status: Never Smoker   . Smokeless tobacco: None  . Alcohol Use: No  . Drug Use: No  . Sexually Active: None   Other Topics Concern  . None   Social History Narrative   Pt. plans to sue [employer] for testing supplies [phone] Mitchell Heir July 28,2010 2:10PMPt is [place] Arabic.    Review of Systems: Constitutional:  Denies fever, chills, diaphoresis, appetite change and fatigue.  HEENT: Denies photophobia, eye pain, redness, hearing loss, ear pain, congestion, sore throat, rhinorrhea, sneezing, mouth sores, trouble swallowing, neck pain, neck stiffness and tinnitus.   Respiratory: Denies SOB, DOE, cough, chest tightness,  and wheezing.   Cardiovascular: Denies chest pain, palpitations and leg swelling.  Gastrointestinal: Denies nausea, vomiting, abdominal pain, diarrhea, constipation, blood in stool and abdominal distention.  Genitourinary: Denies dysuria, urgency, frequency, hematuria, flank pain and difficulty urinating.  Musculoskeletal: Denies myalgias, back pain, joint swelling, arthralgias and gait problem.  Skin: Denies pallor, rash and wound.  Neurological: Denies dizziness, seizures, syncope, weakness, light-headedness, numbness  and headaches.  Hematological: Denies adenopathy. Easy bruising, personal or family bleeding history  Psychiatric/Behavioral: Denies suicidal ideation, mood changes, confusion, nervousness, sleep disturbance and agitation  Objective:  Physical Exam: Filed Vitals:   03/26/11 1610  BP: 122/75  Pulse: 95  Temp: 97.9 F (36.6 C)  TempSrc: Oral  Height: 5' 4.5" (1.638 m)  Weight: 189 lb 14.4 oz (86.138 kg)   Constitutional: Vital signs reviewed.  Patient is a well-developed and well-nourished woman in no acute distress and cooperative with exam. Alert and oriented x3.  Head: Normocephalic and atraumatic Ear: TM normal bilaterally Mouth: no erythema or exudates, MMM Eyes: conjunctivae normal, No scleral icterus.  Neck: Supple, Trachea midline normal ROM, No JVD, mass, thyromegaly, or carotid bruit present.  Cardiovascular: RRR, S1 normal, S2 normal, no MRG, pulses symmetric and intact bilaterally Pulmonary/Chest: CTAB, no wheezes, rales, or rhonchi Abdominal: Soft. Non-tender, non-distended, bowel sounds are normal, no masses, organomegaly, or guarding present.  GU: no CVA tenderness Musculoskeletal: No joint deformities, erythema, or stiffness, ROM full and no nontender Hematology: no cervical, inginal, or axillary adenopathy.  Neurological: A&O x3, Strenght is normal and symmetric bilaterally, cranial nerve II-XII are grossly intact, no focal motor deficit, sensory intact to light touch bilaterally.  Skin: Warm, dry and intact. No rash, cyanosis, or clubbing.  Psychiatric: Normal mood and affect. speech and behavior is normal. Judgment and thought content normal. Cognition and memory are normal.   Assessment & Plan:    1. DM, insulin dependent. -History of poor control due to the patient/s lack of adherence with medication regimen due to poor insight and language barrier. -Strongly advised on compliance with diet and insulin regimen. -S:S of hypo and hyperglycemia reviewed with the  patient. -Refills on nystatin cream, lancets and test strips were given to the patient. - requested last consult report from the patient's ophthalmologist.

## 2011-04-01 ENCOUNTER — Other Ambulatory Visit: Payer: Self-pay | Admitting: Dietician

## 2011-04-01 ENCOUNTER — Encounter: Payer: Self-pay | Admitting: Dietician

## 2011-04-01 ENCOUNTER — Ambulatory Visit (INDEPENDENT_AMBULATORY_CARE_PROVIDER_SITE_OTHER): Payer: Medicaid Other | Admitting: Dietician

## 2011-04-01 VITALS — Ht 64.5 in | Wt 191.0 lb

## 2011-04-01 MED ORDER — GLUCOSE BLOOD VI STRP: ORAL_STRIP | Status: DC

## 2011-04-01 NOTE — Progress Notes (Signed)
Diabetes Self-Management Training (DSMT)  Follow-Up 4 Visit  04/01/2011 Ms. Cathie Jennifer [name], identified by name and date of birth, is a 40 y.o. female with Type 2 Diabetes. Year of diabetes diagnosis: 2000 Other persons present: spouse/SO and interpreter  ASSESSMENT Patient concerns are Healthy Lifestyle and Glycemic control.  Height 5' 4.5" (1.638 m), weight 191 lb (86.637 kg), last menstrual period 03/02/2011. Body mass index is 32.28 kg/(m^2). Lab Results  Component Value Date   LDLCALC 115* 12/03/2010   Lab Results  Component Value Date   HGBA1C 13.2 02/26/2011   Medication Nutrition Monitor: accu check nano but no strips  Labs reviewed.  DIABETES BUNDLE: A1C in past 6 months? Yes.  Less than 7%? No LDL in past year? Yes.  Less than 100 mg/dL? No Microalbumin ratio in past year? Yes. Blood pressure less than 130/80? Yes. Foot exam in last year? Yes. Eye exam in past year? Yes. Tobacco use? No. Pneumovax? Yes Flu vaccine? Yes Asprin? No  Family history of diabetes: No  Support systems: spouse  Special needs: Engineer, structural, Instruct caregiver, language barrier  Prior DM Education: Yes   Medications See Medications list.  Not taking as prescribed    Exercise Plan Doing ADLs for 30 minutesa day.   Self-Monitoring Frequency of testing: 1-2 times/day Breakfast: 84-179 Blood sugar was 321 today after breakfast of half a pita, egg and water   Hyperglycemia: Yes Weekly Hypoglycemia: Yes Weekly   Meal Planning Knowledgable   Assessment comments: forgot insulin yesterday because she fell asleep. Reports episodes of hypoglycemia on 40 units twice daily. Do not think she can achieve desired glycemic control on lantus alone as hypoglycemia occurs when dosed high enough to cover her prandial needs.     INDIVIDUAL DIABETES EDUCATION PLAN:  Medication Acute  complication: _______________________________________________________________________  Intervention TOPICS COVERED TODAY:  Medication  Reviewed patients medication for diabetes, action, purpose, timing of dose and side effects. Acute complication  Taught treatment of hypoglycemia - the 15 rule. Chronic complications  Relationship between chronic complications and blood glucose control  PATIENTS GOALS/PLAN : 1.  Learning Objective:       Understand importance of lowering a1C 2.  Behavioral Objective:         Medications: To improve blood glucose levels, I will take my medication as prescribed Most of the time 75% Problem Solving: To improve my blood glucose control, I will take my insulin earlier in morning nso I can take it earlier inthe evening  Never 0% Reducing Risk: To decrease the risk for complications, I will treat hypoglycemia with 15 grams of carbs if blood glucose less than 70 mg/dl  Half of the time 16%  Personalized Follow-Up Plan for Ongoing Self Management Support:  Doctor's Office, family and CDE visits ______________________________________________________________________   Outcomes Expected outcomes: Demonstrated interest in learning.Expect positive changes in lifestyle.  Self-care Barriers: English as second language, Lack of transportation, Lack of child care, Lack of material resources  Education material provided: oral through interpreter who asked that husband be included in discussion  Patient to contact team via Phone if problems or questions.  Time in: 0930     Time out: 1030  Future DSMT - 2 wks   Cavon Nicolls, Lupita Leash

## 2011-04-01 NOTE — Patient Instructions (Signed)
Your blood sugar today is 321. This is too high.  Your blood sugar should be 100-200 at all times both before and after meals.  Your last 3 month test was 13.2 which is equal to average of blood sugars about 300.  Your next 3 month test will be due in February. Your 3 month test should be between 7-8  Please make an appointment in 2 weeks

## 2011-04-01 NOTE — Telephone Encounter (Signed)
Patient had accu-chek nano meter but has not been using since she ran out of strips in November: readings were 84, 100 range mostly in nano. Did not bring other meter that she has been using.  Request Rx for strips for accu check nano meter. ( patient says she will use former lancing device as she is having difficulty understanding how to user accu chek fast click

## 2011-04-18 ENCOUNTER — Ambulatory Visit (INDEPENDENT_AMBULATORY_CARE_PROVIDER_SITE_OTHER): Payer: Medicaid Other | Admitting: Dietician

## 2011-04-18 VITALS — Ht 64.5 in | Wt 194.1 lb

## 2011-04-18 NOTE — Progress Notes (Signed)
Diabetes Self-Management Training (DSMT)  Follow-Up 4 Visit  04/18/2011 Jennifer [name], identified by name and date of birth, is a 40 y.o. female with Type 2 Diabetes. Year of diabetes diagnosis: 2000 Other persons present: interpreter  ASSESSMENT Patient concerns are Healthy Lifestyle and Glycemic control.  Height 5' 4.5" (1.638 m), weight 194 lb 1.6 oz (88.043 kg), last menstrual period 03/02/2011. Body mass index is 32.80 kg/(m^2). Lab Results  Component Value Date   LDLCALC 115* 12/03/2010   Lab Results  Component Value Date   HGBA1C 13.2 02/26/2011   Medication Nutrition Monitor: accu check nano but no strips  Labs reviewed.  DIABETES BUNDLE: A1C in past 6 months? Yes.  Less than 7%? No LDL in past year? Yes.  Less than 100 mg/dL? No Microalbumin ratio in past year? Yes. Blood pressure less than 130/80? Yes. Foot exam in last year? Yes. Eye exam in past year? Yes. Tobacco use? No. Pneumovax? Yes Flu vaccine? Yes Asprin? No  Family history of diabetes: No Support systems: spouse Special needs: Engineer, structural, Instruct caregiver, language barrier Prior DM Education: Yes   Medications See Medications list.  Taking Lantus as directed except forgot one time in past 2 weeks    Exercise Plan Doing ADLs for 30 minutesa day.   Self-Monitoring Frequency of testing: 1-2 times/day Generally improved although not at goal. See meter download to be scanned   Hyperglycemia: Yes Weekly Hypoglycemia:no   Meal Planning Knowledgable   Assessment comments: forgot insulin only one time and awoke with blood sugar in 500s then took lantus. Blood sugars fell to 100s the next day. Patient does not show significant insulin resistance.  Still believe that she needs both basal and bolus insulin types to adequately control her blood sugars. Possible options for this: could try prandin with meals, rapid acting insulin with meal or Novolog Mix 70/30. Patient has used Novolog  Mix 70/30 in past and had hypoglycemia and had a difficult time using Novolog in addition to lantus twice daily. Could also try using the lantus once time a day and the Novolog once time a day with largest blood sugar excursion. Await next a1c to determine need for change.   INDIVIDUAL DIABETES EDUCATION PLAN:  Medication Acute complication: _______________________________________________________________________  Intervention TOPICS COVERED TODAY:  Medications Healhty Coping/Stress and Diabetes PATIENTS GOALS/PLAN : 1.  Learning Objective:       Understand importance of lowering a1C 2.  Behavioral Objective:        Medications: To improve blood glucose levels, I will take my medication as prescribed Most of the time 95% Problem Solving: To improve my blood glucose control, I will take my insulin earlier in morning nso I can take it earlier inthe evening  Never 75% Reducing Risk: To decrease the risk for complications, I will treat hypoglycemia with 15 grams of carbs if blood glucose less than 70 mg/dl  Half of the time 16%  Personalized Follow-Up Plan for Ongoing Self Management Support:  Doctor's Office, family and CDE visits ______________________________________________________________________   Outcomes Expected outcomes: Demonstrated interest in learning.Expect positive changes in lifestyle. Self-care Barriers: English as second language, Lack of transportation, Lack of child care, Lack of material resources Education material provided: oral through interpreter Patient to contact team via Phone if problems or questions.  Time in: 0840     Time out: 0930  Future DSMT - 4- 6 weeks   P[place]ler, Lupita Leash

## 2011-04-18 NOTE — Patient Instructions (Signed)
Congratulations on better blood sugars and doing all the Diabetes care for 2012 your need to do to take care of you!  Please make an appointment with Doctor in late January early February.  Make an appointment with Norm Parcel - Diabetes Educator in late February early march.

## 2011-04-19 ENCOUNTER — Encounter: Payer: Self-pay | Admitting: Dietician

## 2011-06-25 ENCOUNTER — Encounter: Payer: Self-pay | Admitting: Internal Medicine

## 2011-06-25 ENCOUNTER — Ambulatory Visit (INDEPENDENT_AMBULATORY_CARE_PROVIDER_SITE_OTHER): Payer: Medicaid Other | Admitting: Internal Medicine

## 2011-06-25 VITALS — BP 113/73 | HR 93 | Temp 97.0°F | Ht 64.5 in | Wt 189.1 lb

## 2011-06-25 DIAGNOSIS — Z7901 Long term (current) use of anticoagulants: Secondary | ICD-10-CM

## 2011-06-25 DIAGNOSIS — R300 Dysuria: Secondary | ICD-10-CM

## 2011-06-25 LAB — POCT URINALYSIS DIPSTICK
Bilirubin, UA: NEGATIVE
Glucose, UA: >=1000
Nitrite, UA: NEGATIVE
Spec Grav, UA: >=1.03

## 2011-06-25 LAB — GLUCOSE, CAPILLARY: Glucose-Capillary: 283 mg/dL — ABNORMAL HIGH (ref 70–99)

## 2011-06-25 LAB — POCT GLYCOSYLATED HEMOGLOBIN (HGB A1C): Hemoglobin A1C: 10.9

## 2011-06-25 MED ORDER — FLUCONAZOLE 100 MG PO TABS: 100.0000 mg | ORAL_TABLET | Freq: Every day | ORAL | Status: AC

## 2011-06-25 NOTE — Progress Notes (Signed)
[name], female   DOB: [birth year], 41 y.o.   MRN: [id number]  Subjective:   [name] female   DOB: [birth year] 41 y.o.   MRN: [id number]  HPI: Jennifer [name] is a 41 y.o.  Pleasant woman who speaks only Arabic, presents for: 1.DM follow up. Patient did f/u with a referral to Ms. Plyler for a DME.patient self-adjusted her insulin regimen due to several episodes of hypoglycemia -stopped using Novolog; and decreased Glargine down to 28 units in am and 34 units in pm. 2. Vaginal itching without  Any vaginal discharge, malodor, dyspareunia or any other Sx. Similar to previous episodes of vaginal moniliases.    Past Medical History  Diagnosis Date  . Dysuria   . Candidiasis, skin or nails   . Routine/ritual circumcision   . Anemia, iron deficiency   . Glaucoma associated with ocular inflammation   . Uveitis   . Sarcoidosis     R/O  . Decreased visual acuity     Left eye  . Hair loss   . Hyperlipidemia   . Pap smear abnormality of cervix with LGSIL   . Type II diabetes mellitus    Current Outpatient Prescriptions  Medication Sig Dispense Refill  . atropine 1 % ophthalmic solution Place 1 drop into both eyes 2 (two) times daily. Prescribed by ophthalmologist at [facility]  2 mL  11  . brimonidine-timolol (COMBIGAN) 0.2-0.5 % ophthalmic solution Place 1 drop into both eyes 2 (two) times daily. To affected eye  10 mL  11  . glucose blood (ACCU-CHEK SMARTVIEW) test strip Check blood sugar up to 4 times daily.  150 each  12  . insulin aspart (NOVOLOG) 100 UNIT/ML injection Inject 8 units [place] before meals, three times daily.  10 mL  11  . insulin glargine (LANTUS) 100 UNIT/ML injection Inject 40 Units into the skin 2 (two) times daily.  10 mL  11  . Insulin Pen Needle (SURE COMFORT PEN NEEDLES) 31G X 8 MM MISC Inject 1 Container into the skin 2 (two) times daily. Use with insulin pen as directed  100 each  11  . Insulin Syringe-Needle U-100 (PRODIGY INSULIN  SYRINGE) 31G X 5/16" 0.5 ML MISC Inject 1 Device into the skin at bedtime. Use to inject insulin once daily  5 each  11  . Lancets (ACCU-CHEK SOFT TOUCH) lancets Check your blood glucose 2 times a day before meals  100 each  11  . nystatin cream (MYCOSTATIN) Apply topically as needed for dry skin. 100000 unit/GM; apply to affected area twice a day as needed  30 g  11  . pravastatin (PRAVACHOL) 40 MG tablet Take 1 tablet (40 mg total) by mouth every evening.  30 tablet  11  . prednisoLONE acetate (PRED FORTE) 1 % ophthalmic suspension Place 1 drop into both eyes daily.  10 mL  11  . DISCONTD: insulin glargine (LANTUS SOLOSTAR) 100 UNIT/ML injection Inject 37 Units into the skin at bedtime.  10 mL  12   Current Facility-Administered Medications  Medication Dose Route Frequency Provider Last Rate Last Dose  . pneumococcal 23 valent vaccine (PNU-IMMUNE) injection 0.5 mL  0.5 mL Intramuscular Once Melida Quitter, MD       No family history on file. History   Social History  . Marital Status: Married    Spouse Name: N/A    Number of Children: N/A  . Years of Education: N/A   Social History Main Topics  . Smoking  status: Never Smoker   . Smokeless tobacco: Not on file  . Alcohol Use: No  . Drug Use: No  . Sexually Active: Not on file   Other Topics Concern  . Not on file   Social History Narrative   Pt. plans to sue [employer] for testing supplies [phone] Mitchell Heir July 28,2010 2:10PMPt is [place] Arabic.    Review of Systems: Constitutional: Denies fever, chills, diaphoresis, appetite change and fatigue.  Respiratory: Denies SOB, DOE, cough, chest tightness,  and wheezing.   Cardiovascular: Denies chest pain, palpitations and leg swelling.  Gastrointestinal: Denies nausea, vomiting, abdominal pain, diarrhea, constipation, blood in stool and abdominal distention.  Genitourinary: Denies dysuria, urgency, frequency, hematuria, flank pain and difficulty urinating.   Musculoskeletal: Denies myalgias, back pain, joint swelling, arthralgias and gait problem.  Skin: Denies pallor, rash and wound.  Neurological: Denies dizziness, seizures, syncope, weakness, light-headedness, numbness and headaches.  Hematological: Denies adenopathy. Easy bruising, personal or family bleeding history  Psychiatric/Behavioral: Denies suicidal ideation, mood changes, confusion, nervousness, sleep disturbance and agitation  Objective:  Physical Exam: VS reviewed Constitutional: Vital signs reviewed.  Patient is a well-developed and well-nourished woman in no acute distress and cooperative with exam. Alert and oriented x3.  Head: Normocephalic and atraumatic Ear: TM normal bilaterally Mouth: no erythema or exudates, MMM Eyes: PERRL, EOMI, conjunctivae normal, No scleral icterus.  Neck: Supple, Trachea midline normal ROM, No JVD, mass, thyromegaly, or carotid bruit present.  Cardiovascular: RRR, S1 normal, S2 normal, no MRG, pulses symmetric and intact bilaterally Pulmonary/Chest: CTAB, no wheezes, rales, or rhonchi Abdominal: Soft. Non-tender, non-distended, bowel sounds are normal, no masses, organomegaly, or guarding present.  GU: no CVA tenderness Musculoskeletal: No joint deformities, erythema, or stiffness, ROM full and no nontender Hematology: no cervical, inginal, or axillary adenopathy.  Neurological: A&O x3, Strenght is normal and symmetric bilaterally, cranial nerve II-XII are grossly intact, no focal motor deficit, sensory intact to light touch bilaterally.  Skin: Warm, dry and intact. No rash, cyanosis, or clubbing.  Psychiatric: Normal mood and affect. speech and behavior is normal. Judgment and thought content normal. Cognition and memory are normal.   Assessment & Plan:   1. DM, insulin dependent.  -History of poor control due to the patient/s lack of adherence with medication regimen due to poor insight, language barrier and cultural belief in putting her  children's and husband's needs first.  -Strongly advised on compliance with diet and insulin regimen.  -Increase Solostar insulin back to 40 units SQ q 12 hrs -patient refuses to go back to using novolog insulin due to past incidents of hypoglycemia. -S:S of hypo and hyperglycemia reviewed with the patient.  -Refill on Diflucan for vaginal candidaiases given to the patient.  -Urine micro albumin today -follow up in 8 weeks or sooner.

## 2011-06-25 NOTE — Patient Instructions (Addendum)
Please, take all your medications as prescribed. Please, note a CHANGE in insulin dose: Take 40 Units before breakfast and 40 units before bedtime. Please, call via interpretor with any concerns or questions. Please, follow up in 8 weeks or sooner.

## 2011-06-25 NOTE — Progress Notes (Signed)
Addended by: Hassan Buckler on: 06/25/2011 03:29 PM   Modules accepted: Orders

## 2011-07-30 ENCOUNTER — Encounter: Payer: Self-pay | Admitting: Internal Medicine

## 2011-07-30 DIAGNOSIS — H44119 Panuveitis, unspecified eye: Secondary | ICD-10-CM | POA: Insufficient documentation

## 2011-08-14 ENCOUNTER — Other Ambulatory Visit: Payer: Self-pay | Admitting: *Deleted

## 2011-08-14 DIAGNOSIS — IMO0001 Reserved for inherently not codable concepts without codable children: Secondary | ICD-10-CM

## 2011-08-14 MED ORDER — GLUCOSE BLOOD VI STRP: ORAL_STRIP | Status: DC

## 2011-08-20 ENCOUNTER — Encounter: Payer: Self-pay | Admitting: Licensed Clinical Social Worker

## 2011-08-20 ENCOUNTER — Encounter: Payer: Self-pay | Admitting: Internal Medicine

## 2011-08-20 ENCOUNTER — Ambulatory Visit (INDEPENDENT_AMBULATORY_CARE_PROVIDER_SITE_OTHER): Payer: Medicaid Other | Admitting: Internal Medicine

## 2011-08-20 VITALS — BP 139/76 | HR 88 | Temp 97.8°F | Wt 196.1 lb

## 2011-08-20 DIAGNOSIS — IMO0001 Reserved for inherently not codable concepts without codable children: Secondary | ICD-10-CM

## 2011-08-20 LAB — GLUCOSE, CAPILLARY: Glucose-Capillary: 227 mg/dL — ABNORMAL HIGH (ref 70–99)

## 2011-08-20 NOTE — Patient Instructions (Signed)
Please, follow up with Dr. Christen Bame in 2-3 months and call with ANY questions. Happy Spring!!!!

## 2011-08-20 NOTE — Progress Notes (Signed)
CSW was fortunate to meet with Cathie Olden a 41 y.o. female, married with 4 children.  One adult child and three ages 41, 29, and 58 currently living in the home.  Ms. Flaharty husband is currently vacationing out of the country.  Pt is in office today with her nephew.  Ms. Galka has the dx of Type II Diabetes, which is often uncontrolled.  Ms. Strubel denies any needs at this time as she states her medicaid and food stamps take care of any needs she may have.  Pt has no other services in the home.  CSW provided pt with brochure on [facility], offering to find a translated version.  Pt declined stating her children read [place].  CSW explained [facility] would be beneficial to help with disease management, education and support of her Diabetes dx.  CSW provided pt with contact information and indicated referral can be made whenever pt would like.

## 2011-08-20 NOTE — Progress Notes (Signed)
Diabetic test strips were called into [facility] [address] instead [facility] [address]. Stanton Kidney Bryceson Grape RN 08/20/11 11:45AM

## 2011-08-20 NOTE — Progress Notes (Signed)
Patient ID: Zadaya Cuadra, female   DOB: 1971/02/28, 41 y.o.   MRN: [id number] HPI:    1. DM, insulin-dependent. Patient reports taking Glargin insulin at 40 Units bid as instructed. Patient discontinued Novolog 2 months ago due to reported hypoglycemia. No new episodes of hypoglycemia reported. Patient states that overall she is feeling better. Denies any concerns. Review of Systems: Negative except per history of present illness  Physical Exam:  Nursing notes and vitals reviewed General:  alert, well-developed, and cooperative to examination.   Lungs:  normal respiratory effort, no accessory muscle use, normal breath sounds, no crackles, and no wheezes. Heart:  normal rate, regular rhythm, no murmurs, no gallop, and no rub.   Abdomen:  soft, non-tender, normal bowel sounds, no distention, no guarding, no rebound tenderness, no hepatomegaly, and no splenomegaly.   Extremities:  No cyanosis, clubbing, edema Neurologic:  alert & oriented X3, nonfocal exam  Meds: reviewed   Allergies: Review of patient's allergies indicates no known allergies. Past Medical History  Diagnosis Date  . Dysuria   . Candidiasis, skin or nails   . Routine/ritual circumcision   . Anemia, iron deficiency   . Glaucoma associated with ocular inflammation   . Uveitis   . Sarcoidosis     R/O  . Decreased visual acuity     Left eye  . Hair loss   . Hyperlipidemia   . Pap smear abnormality of cervix with LGSIL   . Type II diabetes mellitus    No past surgical history on file. No family history on file. History   Social History  . Marital Status: Married    Spouse Name: N/A    Number of Children: N/A  . Years of Education: N/A   Occupational History  . Not on file.   Social History Main Topics  . Smoking status: Never Smoker   . Smokeless tobacco: Never Used  . Alcohol Use: No  . Drug Use: No  . Sexually Active: Not on file   Other Topics Concern  . Not on file   Social History Narrative   Pt. plans to sue [facility] for testing supplies [phone] Mitchell Heir July 28,2010 2:10PMPt is [place] Arabic.     A/P: 1. 1. DM, insulin dependent.  -History of poor control due to the patient's language barrier despite having an interpretor with each OV. -Strongly advised to continue adherence with diet and insulin regimen.  -continue Solostar insulin 40 units SQ q 12 hrs  -S:S of hypo and hyperglycemia reviewed with the patient.  -follow up with DME with Ms. Plyler. -RTC in 2-3 months.

## 2011-08-28 ENCOUNTER — Telehealth: Payer: Self-pay | Admitting: Licensed Clinical Social Worker

## 2011-08-28 NOTE — Telephone Encounter (Signed)
CSW received call from pt's son, Jennifer [name].  Pt gave CSW business card to son for assistance with foreclosure.  Son states pt/spouse have been unable to make mortgage payments and received a Foreclosure notice.  Son states, spouse has returned from being out of the country as of yesterday.  CSW called [employer] and was able to speak with pt's case worker, Kouts.  Pt's case with [employer] has been closed due to no income coming into the home.  [employer] states pt will not qualify for the [employer].  [employer] had previously referred pt to Legal Aid's Foreclosure Hotline.  CSW returned call to pt's son and encouraged son to call Legal Aid if his family wanted to try to keep the house.  Provided [facility] Section 8 application number, also, provided [facility] referral to assist pt and spouse with employment resources.

## 2011-09-03 ENCOUNTER — Ambulatory Visit (INDEPENDENT_AMBULATORY_CARE_PROVIDER_SITE_OTHER): Payer: Medicaid Other | Admitting: Dietician

## 2011-09-03 VITALS — Wt 199.4 lb

## 2011-09-03 DIAGNOSIS — IMO0001 Reserved for inherently not codable concepts without codable children: Secondary | ICD-10-CM

## 2011-09-03 NOTE — Patient Instructions (Signed)
To lower your blood sugars:    Try to walk 20 minutes/day on five days of the week.  I will talk to your doctor about taking 5 units of Novolog insulin with your afternoon meal- your largest.  You said you want to try to decrease your added sugar in your tea.  Please make an appointment with a  Doctor about your severe headache and nausea yesterday.

## 2011-09-04 ENCOUNTER — Ambulatory Visit (INDEPENDENT_AMBULATORY_CARE_PROVIDER_SITE_OTHER): Payer: Medicaid Other | Admitting: Internal Medicine

## 2011-09-04 ENCOUNTER — Encounter: Payer: Self-pay | Admitting: Internal Medicine

## 2011-09-04 VITALS — BP 128/70 | HR 88 | Temp 97.9°F | Wt 197.8 lb

## 2011-09-04 DIAGNOSIS — E785 Hyperlipidemia, unspecified: Secondary | ICD-10-CM

## 2011-09-04 DIAGNOSIS — R51 Headache: Secondary | ICD-10-CM

## 2011-09-04 DIAGNOSIS — IMO0001 Reserved for inherently not codable concepts without codable children: Secondary | ICD-10-CM

## 2011-09-04 DIAGNOSIS — R519 Headache, unspecified: Secondary | ICD-10-CM | POA: Insufficient documentation

## 2011-09-04 LAB — GLUCOSE, CAPILLARY: Glucose-Capillary: 263 mg/dL — ABNORMAL HIGH (ref 70–99)

## 2011-09-04 MED ORDER — PRAVASTATIN SODIUM 40 MG PO TABS: 40.0000 mg | ORAL_TABLET | Freq: Every evening | ORAL | Status: DC

## 2011-09-04 NOTE — Assessment & Plan Note (Signed)
Lab Results  Component Value Date   HGBA1C 10.9 06/25/2011   HGBA1C 10.3 01/31/2010   CREATININE 0.49* 02/26/2011   CREATININE 0.58 07/18/2009   MICROALBUR 2.23* 06/25/2011   MICRALBCREAT 19.2 06/25/2011   CHOL 184 12/03/2010   HDL 55 12/03/2010   TRIG 72 12/03/2010    Last eye exam and foot exam:    Component Value Date/Time   HMDIABEYEEXA appt. w/Dr. Lottie [name] at [facility] 09/13/09 08/29/2009   HMDIABFOOTEX done 04/24/2009    Assessment: Diabetes control: not controlled Progress toward goals: unchanged Barriers to meeting goals: nonadherence to medications  Plan: Diabetes treatment: continue current medications emphasized adherence Refer to: none Instruction/counseling given: reminded to bring blood glucose meter & log to each visit, reminded to bring medications to each visit and discussed the need for weight loss

## 2011-09-04 NOTE — Progress Notes (Signed)
Subjective:     [name], female   DOB: 12-19-1970, 41 y.o.   MRN: [id number]  HPI 41 year old woman with history of DM, HLD, and Glaucoma who presents for:  1.) Headache - which started two days ago in the morning, located whole head but mainly back. The pain was intense, throbbing and burning in nature. She felt nauseated, and threw up twice before EMS was called. She said she had blurry vision before she threw up. After she threw up, she felt better. She never sought any further medical attention because she felt better after she threw up. CBG was 287 and BO was 180s at time of headache. She is not in pain today, but would like CT scan for further work up.   2.) DM - Sometimes forgets to take one dose of lantus.   Patient has no other complaints or concerns today. She denies chest pain, cough, sob, headache, N/V, changes in abdominal and urinary character.   Review of Systems  All other systems reviewed and are negative.       Objective:   Physical Exam  Constitutional: She appears well-developed.  HENT:  Head: Normocephalic.  Eyes: EOM are normal. Pupils are equal, round, and reactive to light.  Neck: Normal range of motion. Neck supple.  Cardiovascular: Normal rate and regular rhythm.   Pulmonary/Chest: Effort normal and breath sounds normal.  Abdominal: Soft. Bowel sounds are normal.  Musculoskeletal: Normal range of motion.  Neurological: No cranial nerve deficit. Coordination normal.

## 2011-09-04 NOTE — Assessment & Plan Note (Signed)
Lab Results  Component Value Date   CHOL 184 12/03/2010   HDL 55 12/03/2010   LDLCALC 115* 12/03/2010   TRIG 72 12/03/2010   CHOLHDL 3.3 12/03/2010   LDL slightly elevated, and presume it is higher because patient has not taken statin in over 4 months. Refilled medication, and will check lipid profile in 3 months.

## 2011-09-04 NOTE — Patient Instructions (Signed)
Please follow up with CT scan as scheduled. Please follow up in 1-2 weeks if you would like to discuss the results.

## 2011-09-04 NOTE — Assessment & Plan Note (Signed)
Clinically correlates with migraine with aura, however patient never experienced migraines in the past. Additionally, she states it came on suddenly causing blurry vision and nausea. There is some concern for [facility] since it has only been 2 days since she had this headache. As d/w Dr. Rogelia Boga, will get CT Head without contrast for further evaluation and r/o SAH.

## 2011-09-05 ENCOUNTER — Other Ambulatory Visit: Payer: Self-pay | Admitting: Internal Medicine

## 2011-09-05 MED ORDER — INSULIN GLARGINE 100 UNIT/ML ~~LOC~~ SOLN: 37.0000 [IU] | Freq: Two times a day (BID) | SUBCUTANEOUS | Status: DC

## 2011-09-06 ENCOUNTER — Telehealth: Payer: Self-pay | Admitting: Dietician

## 2011-09-06 ENCOUNTER — Other Ambulatory Visit: Payer: Self-pay | Admitting: Dietician

## 2011-09-06 DIAGNOSIS — IMO0001 Reserved for inherently not codable concepts without codable children: Secondary | ICD-10-CM

## 2011-09-06 MED ORDER — INSULIN GLARGINE 100 UNIT/ML ~~LOC~~ SOLN: 65.0000 [IU] | Freq: Every morning | SUBCUTANEOUS | Status: DC

## 2011-09-06 MED ORDER — INSULIN ASPART 100 UNIT/ML ~~LOC~~ SOLN: SUBCUTANEOUS | Status: DC

## 2011-09-06 NOTE — Progress Notes (Signed)
Diabetes Self-Management Training (DSMT)  Follow-Up 5 Visit  09/06/2011 Ms. Cathie Olden, identified by name and date of birth, is a 41 y.o. female with Type 2 Diabetes. Year of diabetes diagnosis: 2000 Other persons present: interpreter and her son  ASSESSMENT Patient concerns are Healthy Lifestyle and Glycemic control.  Weight 199 lb 6.4 oz (90.447 kg). There is no height on file to calculate BMI. Lab Results  Component Value Date   LDLCALC 115* 12/03/2010   Lab Results  Component Value Date   HGBA1C 10.9 06/25/2011   Medication Nutrition Monitor: accu check nano but no strips  Labs reviewed.  Family history of diabetes: No Support systems: spouse, children Special needs: Engineer, structural, Instruct caregiver, language barrier Prior DM Education: Yes   Medications See Medications list.  Taking Lantus as directed except forgot one time in past 2 weeks    Exercise Plan Doing ADLs for 30 minutesa day admit to little other acitivity and wants to work on this to help lower her a1C.   Self-Monitoring Frequency of testing: 1-2 times/day. Pattern with rising CBGs during day with ~50% fasting CBGs in target. No readings > 5 PM.  Generally improved although not at goal. See meter download to be scanned  Hyperglycemia: Yes Weekly Hypoglycemia:no   Meal Planning Knowledgable- patient reports snacks throughout day until full. Biggest meal is ~ 2 PM.   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 See patient instructions for patient plan to lower her blood sugar. She still misses evening lantus sometimes, but is doing better at taking it consistently. recommend lowering lantus dose by same amount. Suggest new regimen of lantus  35 units twice daily or 70 units once daily in am and Novolog 5 units ac 2 pm meal  INDIVIDUAL DIABETES EDUCATION PLAN:  Meal planning Physical activity Medication Acute complication:  hyperglycemia _______________________________________________________________________  Intervention TOPICS COVERED TODAY:  Medications Healhty Coping/Stress and Diabetes PATIENTS GOALS/PLAN : 1.  Learning Objective:       Understand what can  lower A1C 2.  Behavioral Objective:        Medications: To improve blood glucose levels, I will take my medication as prescribed Most of the time 99% Problem Solving: To improve my blood glucose control, I will take my insulin earlier in morning nso I can take it earlier inthe evening  Never 75% Reducing Risk: To decrease the risk for complications, I will treat hypoglycemia with 15 grams of carbs if blood glucose less than 70 mg/dl  Half of the time 21%  Personalized Follow-Up Plan for Ongoing Self Management Support:  Doctor's Office, family and CDE visits ______________________________________________________________________   Outcomes Expected outcomes: Demonstrated interest in learning.Expect positive changes in lifestyle. Self-care Barriers: English as second language, Lack of transportation, Lack of child care, Lack of material resources Education material provided: oral through interpreter Patient to contact team via Phone if problems or questions.  Time in: 1540     Time out: 1630  Future DSMT - 4- 6 weeks   Shreyan Hinz, Lupita Leash

## 2011-09-06 NOTE — Telephone Encounter (Signed)
Unable to contact patient with new insulin orders, however was able to reach her husband on his mobile number. He verbalized understanding to new dose of lantus and adding Novolog by repeating it back through the interpreter. He agreed to give her the message.    He also asked about side effects of taking a dose of 65 units of insulin at one time. CDE explained Lantus is a slow acting insulin and only releases a small amount of insulin per hour throughout the day.

## 2011-09-06 NOTE — Progress Notes (Signed)
Discussed patient's insulin regimen with Dr. Baltazar Apo who agreed to change. Lantus change to 65 once daily and Novolog 5 units before 2 Pm meal

## 2011-09-12 ENCOUNTER — Ambulatory Visit (HOSPITAL_COMMUNITY)
Admission: RE | Admit: 2011-09-12 | Discharge: 2011-09-12 | Disposition: A | Discharge status: Home / Self Care | Payer: Medicaid Other | Source: Ambulatory Visit | Attending: Internal Medicine | Admitting: Internal Medicine

## 2011-09-12 DIAGNOSIS — R51 Headache: Secondary | ICD-10-CM | POA: Insufficient documentation

## 2011-09-13 ENCOUNTER — Other Ambulatory Visit (HOSPITAL_COMMUNITY): Payer: Medicaid Other

## 2011-10-14 ENCOUNTER — Inpatient Hospital Stay (HOSPITAL_COMMUNITY)
Admission: AD | Admit: 2011-10-14 | Discharge: 2011-10-15 | Disposition: A | Discharge status: Home / Self Care | Payer: Medicaid Other | Source: Ambulatory Visit | Attending: Obstetrics and Gynecology | Admitting: Obstetrics and Gynecology

## 2011-10-14 ENCOUNTER — Encounter (HOSPITAL_COMMUNITY): Payer: Self-pay | Admitting: *Deleted

## 2011-10-14 DIAGNOSIS — E119 Type 2 diabetes mellitus without complications: Secondary | ICD-10-CM | POA: Insufficient documentation

## 2011-10-14 DIAGNOSIS — R1032 Left lower quadrant pain: Secondary | ICD-10-CM | POA: Insufficient documentation

## 2011-10-14 DIAGNOSIS — M549 Dorsalgia, unspecified: Secondary | ICD-10-CM | POA: Insufficient documentation

## 2011-10-14 LAB — POCT PREGNANCY, URINE: Preg Test, Ur: NEGATIVE

## 2011-10-14 NOTE — MAU Note (Signed)
USING INTERPRETER  -  Jennifer [name]- DAUGHTER-   SAYS PT LAST SEX  WAS 9 YEARS AGO. BIRTH CONTROL-  PILLS-   FROM DR  PHYLER-  [place] .   SAYS HER BACK HURTS - STARTED  2 WEEKS-  TOOK TYLENOL- YESTERDAY-  SOME RELIEF-  PAIN-  7-  DID NOT CALL DR.       LEFT SIDE PAIN- STARTED  2 WEEKS AGO-- THOUGHT IT WAS CRAMPS- .    LOWER ABD    FEELS TIGHTENING.

## 2011-10-15 ENCOUNTER — Encounter (HOSPITAL_COMMUNITY): Payer: Self-pay | Admitting: *Deleted

## 2011-10-15 ENCOUNTER — Inpatient Hospital Stay (HOSPITAL_COMMUNITY): Payer: Medicaid Other

## 2011-10-15 ENCOUNTER — Encounter: Payer: Medicaid Other | Admitting: Dietician

## 2011-10-15 DIAGNOSIS — R1032 Left lower quadrant pain: Secondary | ICD-10-CM

## 2011-10-15 LAB — URINE MICROSCOPIC-ADD ON

## 2011-10-15 LAB — WET PREP, GENITAL: Trich, Wet Prep: NONE SEEN

## 2011-10-15 LAB — CBC
Platelets: 184 10*3/uL (ref 150–400)
RBC: 4.69 MIL/uL (ref 3.87–5.11)
RDW: 15.4 % (ref 11.5–15.5)
WBC: 6.8 10*3/uL (ref 4.0–10.5)

## 2011-10-15 LAB — URINALYSIS, ROUTINE W REFLEX MICROSCOPIC
Glucose, UA: >1000 mg/dL — AB
Leukocytes, UA: NEGATIVE
Protein, ur: NEGATIVE mg/dL
Specific Gravity, Urine: 1.015 (ref 1.005–1.030)

## 2011-10-15 MED ORDER — FERROUS FUMARATE-FOLIC ACID 324-1 MG PO TABS: 1.0000 | ORAL_TABLET | Freq: Every day | ORAL | Status: DC

## 2011-10-15 MED ORDER — CYCLOBENZAPRINE HCL 10 MG PO TABS: 10.0000 mg | ORAL_TABLET | Freq: Three times a day (TID) | ORAL | Status: AC | PRN

## 2011-10-15 NOTE — Discharge Instructions (Signed)
Back Pain, Adult Low back pain is very common. About 1 in 5 people have back pain.The cause of low back pain is rarely dangerous. The pain often gets better over time.About half of people with a sudden onset of back pain feel better in just 2 weeks. About 8 in 10 people feel better by 6 weeks.  CAUSES Some common causes of back pain include:  Strain of the muscles or ligaments supporting the spine.   Wear and tear (degeneration) of the spinal discs.   Arthritis.   Direct injury to the back.  DIAGNOSIS Most of the time, the direct cause of low back pain is not known.However, back pain can be treated effectively even when the exact cause of the pain is unknown.Answering your caregiver's questions about your overall health and symptoms is one of the most accurate ways to make sure the cause of your pain is not dangerous. If your caregiver needs more information, he or she may order lab work or imaging tests (X-rays or MRIs).However, even if imaging tests show changes in your back, this usually does not require surgery. HOME CARE INSTRUCTIONS For many people, back pain returns.Since low back pain is rarely dangerous, it is often a condition that people can learn to manageon their own.   Remain active. It is stressful on the back to sit or stand in one place. Do not sit, drive, or stand in one place for more than 30 minutes at a time. Take short walks on level surfaces as soon as pain allows.Try to increase the length of time you walk each day.   Do not stay in bed.Resting more than 1 or 2 days can delay your recovery.   Do not avoid exercise or work.Your body is made to move.It is not dangerous to be active, even though your back may hurt.Your back will likely heal faster if you return to being active before your pain is gone.   Pay attention to your body when you bend and lift. Many people have less discomfortwhen lifting if they bend their knees, keep the load close to their  bodies,and avoid twisting. Often, the most comfortable positions are those that put less stress on your recovering back.   Find a comfortable position to sleep. Use a firm mattress and lie on your side with your knees slightly bent. If you lie on your back, put a pillow under your knees.   Only take over-the-counter or prescription medicines as directed by your caregiver. Over-the-counter medicines to reduce pain and inflammation are often the most helpful.Your caregiver may prescribe muscle relaxant drugs.These medicines help dull your pain so you can more quickly return to your normal activities and healthy exercise.   Put ice on the injured area.   Put ice in a plastic bag.   Place a towel between your skin and the bag.   Leave the ice on for 15 to 20 minutes, 3 to 4 times a day for the first 2 to 3 days. After that, ice and heat may be alternated to reduce pain and spasms.   Ask your caregiver about trying back exercises and gentle massage. This may be of some benefit.   Avoid feeling anxious or stressed.Stress increases muscle tension and can worsen back pain.It is important to recognize when you are anxious or stressed and learn ways to manage it.Exercise is a great option.  SEEK MEDICAL CARE IF:  You have pain that is not relieved with rest or medicine.   You have   pain that does not improve in 1 week.   You have new symptoms.   You are generally not feeling well.  SEEK IMMEDIATE MEDICAL CARE IF:   You have pain that radiates from your back into your legs.   You develop new bowel or bladder control problems.   You have unusual weakness or numbness in your arms or legs.   You develop nausea or vomiting.   You develop abdominal pain.   You feel faint.  Document Released: 04/08/2005 Document Revised: 03/28/2011 Document Reviewed: 08/27/2010 [employer] Patient Information 2012 [employer]. 

## 2011-10-15 NOTE — MAU Provider Note (Signed)
History     CSN: [id number]  Arrival date and time: 10/14/11 2318   First Provider Initiated Contact with Patient 10/15/11 0048      No chief complaint on file.  HPI This is a 41 y.o. female from [place] who presents with c/o LLQ and left mid-back pain for 2 weeks. Has a family doctor but did not call him "because her mother just died and she was busy".  Daughter translates, though her mother speaks and understands a fair amount of [place].   Denies diarrhea or constipation and reports some nausea.  Irregular cycles, just had one June 10-15.   RN NOTE: USING INTERPRETER - BASHAYER- DAUGHTER- SAYS PT LAST SEX WAS 9 YEARS AGO. BIRTH CONTROL- PILLS- FROM DR PHYLER- [place] . SAYS HER BACK HURTS - STARTED 2 WEEKS- TOOK TYLENOL- YESTERDAY- SOME RELIEF- PAIN- 7- DID NOT CALL DR. LEFT SIDE PAIN- STARTED 2 WEEKS AGO-- THOUGHT IT WAS CRAMPS- . LOWER ABD FEELS TIGHTENING.       OB History    Grav Para Term Preterm Abortions TAB SAB Ect Mult Living   7 4 4  3  3   4       Past Medical History  Diagnosis Date  . Dysuria   . Candidiasis, skin or nails   . Routine/ritual circumcision   . Anemia, iron deficiency   . Glaucoma associated with ocular inflammation   . Uveitis   . Sarcoidosis     R/O  . Decreased visual acuity     Left eye  . Hair loss   . Hyperlipidemia   . Pap smear abnormality of cervix with LGSIL   . Type II diabetes mellitus     Past Surgical History  Procedure Date  . Cesarean section     History reviewed. No pertinent family history.  History  Substance Use Topics  . Smoking status: Never Smoker   . Smokeless tobacco: Never Used  . Alcohol Use: No    Allergies: No Known Allergies  Prescriptions prior to admission  Medication Sig Dispense Refill  . acetaminophen (TYLENOL) 325 MG tablet Take 650 mg by mouth every 6 (six) hours as needed.      Marland Kitchen atropine 1 % ophthalmic solution Place 1 drop into both eyes 2 (two) times daily. Prescribed by  ophthalmologist at [facility]  2 mL  11  . brimonidine-timolol (COMBIGAN) 0.2-0.5 % ophthalmic solution Place 1 drop into both eyes 2 (two) times daily. To affected eye  10 mL  11  . glucose blood (ACCU-CHEK SMARTVIEW) test strip Check blood sugar up to 4 times daily.  150 each  12  . insulin aspart (NOVOLOG FLEXPEN) 100 UNIT/ML injection Inject 5 units under the skin before largest meal of the day around 2 PM  15 mL  12  . insulin glargine (LANTUS) 100 UNIT/ML injection Inject 65 Units into the skin every morning.  10 mL  11  . Insulin Pen Needle (SURE COMFORT PEN NEEDLES) 31G X 8 MM MISC Inject 1 Container into the skin 2 (two) times daily. Use with insulin pen as directed  100 each  11  . Insulin Syringe-Needle U-100 (PRODIGY INSULIN SYRINGE) 31G X 5/16" 0.5 ML MISC Inject 1 Device into the skin at bedtime. Use to inject insulin once daily  5 each  11  . Lancets (ACCU-CHEK SOFT TOUCH) lancets Check your blood glucose 2 times a day before meals  100 each  11  . pravastatin (PRAVACHOL) 40 MG tablet Take  1 tablet (40 mg total) by mouth every evening.  30 tablet  11  . prednisoLONE acetate (PRED FORTE) 1 % ophthalmic suspension Place 1 drop into both eyes daily.  10 mL  11    ROS As listed in HPI  Physical Exam   Blood pressure 133/73, pulse 86, temperature 98.6 F (37 C), temperature source Oral, resp. rate 20, height 5\' 3"  (1.6 m), weight 194 lb 4 oz (88.111 kg), last menstrual period 09/30/2011.  Physical Exam  Constitutional: She is oriented to person, place, and time. She appears well-developed and well-nourished. No distress.  HENT:  Head: Normocephalic.  Cardiovascular: Normal rate.   Respiratory: Effort normal.  GI: Soft. She exhibits no distension and no mass. There is tenderness (slight over LLQ). There is no rebound and no guarding.  Genitourinary: Uterus normal. Vaginal discharge (old brown blood) found.       EGBUS:  Infibulated with small vaginal opening  Musculoskeletal:  Normal range of motion.  Neurological: She is alert and oriented to person, place, and time.  Skin: Skin is warm and dry.  Psychiatric: She has a normal mood and affect.  Abdomen shows well healed scars from C/S, one low transverse, one low vertical  US Pelvis Complete  10/15/2011  *RADIOLOGY REPORT*  Clinical Data: Left lower quadrant pain for 2 weeks.  TRANSABDOMINAL AND TRANSVAGINAL ULTRASOUND OF PELVIS Technique:  Both transabdominal and transvaginal ultrasound examinations of the pelvis were performed. Transabdominal technique was performed for global imaging of the pelvis including uterus, ovaries, adnexal regions, and pelvic cul-de-sac.  It was necessary to proceed with endovaginal exam following the transabdominal exam to visualize the endometrium, left ovary, and cervix.  Comparison:  06/14/2010  Findings:  Uterus: The uterus is anteverted and measures about 9.2 x 5.2 x 5.5 cm.  No focal myometrial masses.  Cyst in the cervical region consistent with Nabothian cyst.  Endometrium: Homogeneous appearance of the endometrium with endometrial stripe thickness measured at 8 mm.  No endometrial fluid collection.  Right ovary:  The right ovary measures 4.1 x 3 x 2.4 cm.  No abnormal adnexal masses.  Left ovary: The left ovary measures 3.2 x 1.4 x 2.6 cm.  No abnormal adnexal mass lesions.  Flow is demonstrated within both ovaries on color flow Doppler imaging.  Other findings: No free fluid  IMPRESSION: Normal study. No evidence of pelvic mass or other significant abnormality.  Original Report Authenticated By: Marlon Pel, M.D.   Results for orders placed during the hospital encounter of 10/14/11 (from the past 24 hour(s))  URINALYSIS, ROUTINE W REFLEX MICROSCOPIC     Status: Abnormal   Collection Time   10/14/11 11:44 PM      Component Value Range   Color, Urine YELLOW  YELLOW   APPearance CLEAR  CLEAR   Specific Gravity, Urine 1.015  1.005 - 1.030   pH 5.5  5.0 - 8.0   Glucose, UA >1000  (*) NEGATIVE mg/dL   Hgb urine dipstick LARGE (*) NEGATIVE   Bilirubin Urine NEGATIVE  NEGATIVE   Ketones, ur NEGATIVE  NEGATIVE mg/dL   Protein, ur NEGATIVE  NEGATIVE mg/dL   Urobilinogen, UA 0.2  0.0 - 1.0 mg/dL   Nitrite NEGATIVE  NEGATIVE   Leukocytes, UA NEGATIVE  NEGATIVE  URINE MICROSCOPIC-ADD ON     Status: Abnormal   Collection Time   10/14/11 11:44 PM      Component Value Range   Squamous Epithelial / LPF MANY (*) RARE  WBC, UA 0-2  <3 WBC/hpf   RBC / HPF 3-6  <3 RBC/hpf   Urine-Other MUCOUS PRESENT    POCT PREGNANCY, URINE     Status: Normal   Collection Time   10/14/11 11:53 PM      Component Value Range   Preg Test, Ur NEGATIVE  NEGATIVE  WET PREP, GENITAL     Status: Abnormal   Collection Time   10/15/11  1:06 AM      Component Value Range   Yeast Wet Prep HPF POC FEW (*) NONE SEEN   Trich, Wet Prep NONE SEEN  NONE SEEN   Clue Cells Wet Prep HPF POC NONE SEEN  NONE SEEN   WBC, Wet Prep HPF POC MODERATE (*) NONE SEEN  CBC     Status: Abnormal   Collection Time   10/15/11  1:25 AM      Component Value Range   WBC 6.8  4.0 - 10.5 K/uL   RBC 4.69  3.87 - 5.11 MIL/uL   Hemoglobin 10.1 (*) 12.0 - 15.0 g/dL   HCT 14.7 (*) 82.9 - 56.2 %   MCV 72.1 (*) 78.0 - 100.0 fL   MCH 21.5 (*) 26.0 - 34.0 pg   MCHC 29.9 (*) 30.0 - 36.0 g/dL   RDW 13.0  86.5 - 78.4 %   Platelets 184  150 - 400 K/uL     MAU Course  Procedures  MDM CBC and Pelvic US. Cultures done.  Assessment and Plan  A:  LLQ pain, unknown origin, but not Gynecologic       Mid back pain, possibly musculoskeletal      Diabetes, questionable control, followed by primary doctor  P:  Will Rx Flexeril to see if it helps back pain       Refer back to family doctor to further eval LLQ pain       Rx Hemocyte F for anemia  [facility] 10/15/2011, 1:13 AM

## 2011-10-15 NOTE — MAU Provider Note (Signed)
Agree with above note.  Jennifer [name] 10/15/2011 7:19 AM

## 2011-12-03 ENCOUNTER — Encounter: Payer: Self-pay | Admitting: *Deleted

## 2011-12-03 ENCOUNTER — Ambulatory Visit (INDEPENDENT_AMBULATORY_CARE_PROVIDER_SITE_OTHER): Payer: Medicaid Other | Admitting: Internal Medicine

## 2011-12-03 ENCOUNTER — Encounter: Payer: Self-pay | Admitting: Internal Medicine

## 2011-12-03 VITALS — BP 106/78 | HR 100 | Temp 98.1°F | Ht 64.0 in | Wt 191.6 lb

## 2011-12-03 DIAGNOSIS — Z0000 Encounter for general adult medical examination without abnormal findings: Secondary | ICD-10-CM

## 2011-12-03 DIAGNOSIS — E785 Hyperlipidemia, unspecified: Secondary | ICD-10-CM

## 2011-12-03 DIAGNOSIS — Z79899 Other long term (current) drug therapy: Secondary | ICD-10-CM

## 2011-12-03 MED ORDER — INSULIN NPH (HUMAN) (ISOPHANE) 100 UNIT/ML ~~LOC~~ SUSP: 40.0000 [IU] | Freq: Every day | SUBCUTANEOUS | Status: DC

## 2011-12-03 MED ORDER — "INSULIN SYRINGE-NEEDLE U-100 30G X 5/16"" 1 ML MISC": Status: DC

## 2011-12-03 NOTE — Assessment & Plan Note (Signed)
A1c was done today. We did foot examination. Will check her lipid panel. Other health maintenance issues are up-to-date.

## 2011-12-03 NOTE — Assessment & Plan Note (Addendum)
Patient's diabetes is not well controlled. Today her A1c is 10.6. She ran out of her medications due to insurance reason. She is currently applying for Medicare renewal. She did not take her insulin for several days. We provided her with 300 units of Lantus which will last for approximately one week. I also gave her a prescription of NPH Insulin which she can get at [facility] at low price. We also provided her with a new meter at clinic. Since she does not have her meter with her today, it is difficult to adjust her insulin dose. It is better to adjust her insulin dose after she gets her [employer].

## 2011-12-03 NOTE — Progress Notes (Signed)
[name], female   DOB: 1971-02-02, 41 y.o.   MRN: [id number]  Subjective:   [name] female   DOB: 01-07-1971 41 y.o.   MRN: [id number]  HPI: [name] is a 41 y.o. with past medical history as outlined below, who presents for a followup visit.  Patient does not speak [place]. Her son, who speaks fluent English accompanied patient to clinic. Patient does not have any complaints today. She reports that she did not renew her Medicare. she ran out of her insulin in past several days. She is currently applying for the renewal of  Medicare, which will take approximately one and half months. She cannot afford for her Lantus refill and did not take insulin for several days. Currently she does not have any symptoms for hyperglycemia. At a clinic her A1c was 10.6. She also reports that her meter was broken recently and asks for another meters.  Regarding her hyperlipidemia, she is currently taking Pravachol 40 mg daily. She did not notice any side effects, such as muscle pain. Her last AST and ALT were normal at 02/26/11.  Denies fever, chills, fatigue, headaches,  cough, chest pain, SOB,  abdominal pain,diarrhea, constipation, dysuria, urgency, frequency, hematuria.   Past Medical History  Diagnosis Date  . Dysuria   . Candidiasis, skin or nails   . Routine/ritual circumcision   . Anemia, iron deficiency   . Glaucoma associated with ocular inflammation   . Uveitis   . Sarcoidosis     R/O  . Decreased visual acuity     Left eye  . Hair loss   . Hyperlipidemia   . Pap smear abnormality of cervix with LGSIL   . Type II diabetes mellitus    Current Outpatient Prescriptions  Medication Sig Dispense Refill  . acetaminophen (TYLENOL) 325 MG tablet Take 650 mg by mouth every 6 (six) hours as needed.      Marland Kitchen atropine 1 % ophthalmic solution Place 1 drop into both eyes 2 (two) times daily. Prescribed by [facility]mologist at [facility]  2 mL  11  .  brimonidine-timolol (COMBIGAN) 0.2-0.5 % ophthalmic solution Place 1 drop into both eyes 2 (two) times daily. To affected eye  10 mL  11  . Ferrous Fumarate-Folic Acid (HEMOCYTE-F) 324-1 MG TABS Take 1 tablet by mouth daily.  30 each  1  . glucose blood (ACCU-CHEK SMARTVIEW) test strip Check blood sugar up to 4 times daily.  150 each  12  . insulin aspart (NOVOLOG FLEXPEN) 100 UNIT/ML injection Inject 5 units under the skin before largest meal of the day around 2 PM  15 mL  12  . insulin glargine (LANTUS) 100 UNIT/ML injection Inject 65 Units into the skin every morning.  10 mL  11  . insulin NPH (HUMULIN N,NOVOLIN N) 100 UNIT/ML injection Inject 40 Units into the skin daily before breakfast.  10 mL  11  . Insulin Pen Needle (SURE COMFORT PEN NEEDLES) 31G X 8 MM MISC Inject 1 Container into the skin 2 (two) times daily. Use with insulin pen as directed  100 each  11  . Insulin Syringe-Needle U-100 (PRODIGY INSULIN SYRINGE) 31G X 5/16" 0.5 ML MISC Inject 1 Device into the skin at bedtime. Use to inject insulin once daily  5 each  11  . Insulin Syringe-Needle U-100 30G X 5/16" 1 ML MISC Use it for injecting insulin once daily  100 each  11  . Lancets (ACCU-CHEK SOFT TOUCH) lancets Check your  blood glucose 2 times a day before meals  100 each  11  . pravastatin (PRAVACHOL) 40 MG tablet Take 1 tablet (40 mg total) by mouth every evening.  30 tablet  11  . prednisoLONE acetate (PRED FORTE) 1 % ophthalmic suspension Place 1 drop into both eyes daily.  10 mL  11  . DISCONTD: insulin glargine (LANTUS SOLOSTAR) 100 UNIT/ML injection Inject 37 Units into the skin at bedtime.  10 mL  12   No family history on file. History   Social History  . Marital Status: Married    Spouse Name: N/A    Number of Children: N/A  . Years of Education: N/A   Social History Main Topics  . Smoking status: Never Smoker   . Smokeless tobacco: Never Used  . Alcohol Use: No  . Drug Use: No  . Sexually Active: Not Currently    Other Topics Concern  . Not on file   Social History Narrative   Pt. plans to sue [employer] for testing supplies [phone] Mitchell Heir July 28,2010 2:10PMPt is [place] Arabic.    Review of Systems: General: no fevers, chills, no changes in body weight, no changes in appetite Skin: no rash HEENT: no blurry vision, hearing changes or sore throat Pulm: no dyspnea, coughing, wheezing CV: no chest pain, palpitations, shortness of breath Abd: no nausea/vomiting, abdominal pain, diarrhea/constipation GU: no dysuria, hematuria, polyuria Ext: no arthralgias, myalgias Neuro: no weakness, numbness, or tingling   Objective:  Physical Exam: Filed Vitals:   12/03/11 1542  BP: 106/78  Pulse: 100  Temp: 98.1 F (36.7 C)  TempSrc: Oral  Height: 5\' 4"  (1.626 m)  Weight: 191 lb 9.6 oz (86.909 kg)   General: resting in bed, not in acute distress HEENT: PERRL, EOMI, no scleral icterus Cardiac: S1/S2, RRR, No murmurs, gallops or rubs Pulm: Good air movement bilaterally, Clear to auscultation bilaterally, No rales, wheezing, rhonchi or rubs. Abd: Soft,  nondistended, nontender, no rebound pain, no organomegaly, BS present Ext: No rashes or edema, 2+DP/PT pulse bilaterally Musculoskeletal: No joint deformities, erythema, or stiffness, ROM full and nontender Skin: no rashes. No skin bruise. Neuro: alert and oriented X3, cranial nerves II-XII grossly intact, muscle strength 5/5 in all extremeties,  sensation to light touch intact.  Psych.: patient is not psychotic, no suicidal or hemocidal ideation.   Assessment & Plan:

## 2011-12-03 NOTE — Patient Instructions (Addendum)
1. Please use Insulin NPH (40 U daily) from now or after you run out of your Lantus.  2. Please take all medications as prescribed.  3. If you have worsening of your symptoms or new symptoms arise, please call the clinic ([phone]), or go to the ER immediately if symptoms are severe.

## 2011-12-03 NOTE — Assessment & Plan Note (Signed)
She is currently taking Pravachol 40 mg daily. She did not notice any side effects, such as muscle pain. Her last AST and ALT were normal at 02/26/11. Her last LDL was 115 at 12/03/10.  Will continue current regimen. Will check her lipids panel today.

## 2011-12-03 NOTE — Progress Notes (Signed)
Pt presents w/ son at front desk, she does not speak english but son speaks very good english, he states to [place] that pt has not had insulin in 4 days, she had medicaid and let it lapse and now cannot afford medication. Pt states her glucometer is not working at this time, pt states she is not having any symptoms but her son states pt will not admit to having problems, clinic has 1 opening at 1515 w/ dr Clyde Lundborg, please advise if pt needs to be seen or given insulin and a new machine by [facility]. Thank you Spoke w/ dr Eben Burow and dr Clyde Lundborg, dr Clyde Lundborg will see pt at [phone]

## 2011-12-04 LAB — LIPID PANEL
Cholesterol: 203 mg/dL — ABNORMAL HIGH (ref 0–200)
Triglycerides: 106 mg/dL (ref <150)

## 2011-12-06 ENCOUNTER — Encounter: Payer: Self-pay | Admitting: Internal Medicine

## 2011-12-17 ENCOUNTER — Encounter: Payer: Medicaid Other | Admitting: Internal Medicine

## 2012-01-23 ENCOUNTER — Telehealth: Payer: Self-pay | Admitting: *Deleted

## 2012-01-23 NOTE — Telephone Encounter (Signed)
Pt request refill on nystatin 100000 cream for dry skin.  Last filled 10/07/11 Not on active med list but is in past hx.

## 2012-01-23 NOTE — Telephone Encounter (Signed)
Pt was supposed to see me 8/27 but no-showed appt. I would like to see her in the office for follow-up of DM as well as evaluate the area before refilling cream. If it is dry skin only and not yeast infection, nystatin would not be appropriate for her at this time. Thank you!

## 2012-01-27 NOTE — Telephone Encounter (Signed)
Pt phone number not working.  Message sent to pharmacy to have pt call for appointment.

## 2012-01-29 ENCOUNTER — Other Ambulatory Visit: Payer: Self-pay | Admitting: *Deleted

## 2012-01-29 NOTE — Telephone Encounter (Signed)
review 

## 2012-03-24 ENCOUNTER — Encounter: Payer: Self-pay | Admitting: Internal Medicine

## 2012-03-24 ENCOUNTER — Ambulatory Visit (INDEPENDENT_AMBULATORY_CARE_PROVIDER_SITE_OTHER): Payer: Medicaid Other | Admitting: Internal Medicine

## 2012-03-24 VITALS — BP 111/73 | HR 95 | Temp 98.5°F | Ht 65.0 in | Wt 192.6 lb

## 2012-03-24 DIAGNOSIS — E785 Hyperlipidemia, unspecified: Secondary | ICD-10-CM

## 2012-03-24 DIAGNOSIS — Z0000 Encounter for general adult medical examination without abnormal findings: Secondary | ICD-10-CM

## 2012-03-24 DIAGNOSIS — H4040X0 Glaucoma secondary to eye inflammation, unspecified eye, stage unspecified: Secondary | ICD-10-CM

## 2012-03-24 DIAGNOSIS — Z23 Encounter for immunization: Secondary | ICD-10-CM

## 2012-03-24 DIAGNOSIS — IMO0001 Reserved for inherently not codable concepts without codable children: Secondary | ICD-10-CM

## 2012-03-24 DIAGNOSIS — E119 Type 2 diabetes mellitus without complications: Secondary | ICD-10-CM

## 2012-03-24 DIAGNOSIS — L659 Nonscarring hair loss, unspecified: Secondary | ICD-10-CM | POA: Insufficient documentation

## 2012-03-24 LAB — TSH: TSH: 1.995 u[IU]/mL (ref 0.350–4.500)

## 2012-03-24 LAB — BASIC METABOLIC PANEL WITH GFR
CO2: 24 mEq/L (ref 19–32)
Chloride: 103 mEq/L (ref 96–112)
Glucose, Bld: 368 mg/dL — ABNORMAL HIGH (ref 70–99)
Potassium: 4.4 mEq/L (ref 3.5–5.3)
Sodium: 133 mEq/L — ABNORMAL LOW (ref 135–145)

## 2012-03-24 MED ORDER — ATORVASTATIN CALCIUM 20 MG PO TABS: 20.0000 mg | ORAL_TABLET | Freq: Every day | ORAL | Status: DC

## 2012-03-24 MED ORDER — INSULIN GLARGINE 100 UNIT/ML ~~LOC~~ SOLN: 50.0000 [IU] | Freq: Every day | SUBCUTANEOUS | Status: DC

## 2012-03-24 NOTE — Assessment & Plan Note (Signed)
Flu shot given today

## 2012-03-24 NOTE — Progress Notes (Signed)
[name], female   DOB: 12-15-1970, 41 y.o.   MRN: [id number]  Subjective:   [name] female   DOB: 1970/09/14 41 y.o.   MRN: [id number]  HPI: [name] is a 41 y.o. female with past medical history of type 2 diabetes mellitus and hyperlipidemia who presents for followup of diabetes. Patient has had poor compliance with insulin therapy for diabetes over the past few months due to lack of health insurance. She was previously covered by Medicaid, but lost coverage for a period of a couple of months. Over this time, patient was unable to afford her Lantus. In the interim, she was prescribed NPH insulin to takedue to afford ability issues. Within the past month, patient has regained Medicaid health insurance. Today patient tells me that she is only been taking 42 units of Lantus nightly. She had been using the NPH insulin is mealtime coverage taking 4 units twice a day; however she ran out of NPH one month ago. She denies any signs or symptoms of hypoglycemia, and has brought her blood glucose log to this visit today. Her blood sugars appear poorly controlled ranging from 250-350. Point-of-care HbA1c today is 12.5, up nearly 2 points from her visit 3 months prior. She has no complaints of numbness or tingling in extremities. She is up-to-date with ophthalmologic examinations. Her last appointment with proper modest was in June, and she is following closely with him for treatment of left eye glaucoma and bilateral cataracts. Her only complaint today is concerned for hair loss. She reports frontotemporal hair loss over the past few months. Denies any febrile illness or hospitalization within the past 6 months. Denies any fatigue, constipation, or dysfunctional uterine bleeding. She agrees to get a flu shot today.    Past Medical History  Diagnosis Date  . Dysuria   . Candidiasis, skin or nails   . Routine/ritual circumcision   . Anemia, iron deficiency   .  Glaucoma associated with ocular inflammation   . Uveitis   . Sarcoidosis     R/O  . Decreased visual acuity     Left eye  . Hair loss   . Hyperlipidemia   . Pap smear abnormality of cervix with LGSIL   . Type II diabetes mellitus    Current Outpatient Prescriptions  Medication Sig Dispense Refill  . acetaminophen (TYLENOL) 325 MG tablet Take 650 mg by mouth every 6 (six) hours as needed.      Marland Kitchen atorvastatin (LIPITOR) 20 MG tablet Take 1 tablet (20 mg total) by mouth daily.  30 tablet  11  . atropine 1 % ophthalmic solution Place 1 drop into both eyes 2 (two) times daily. Prescribed by ophthalmologist at [facility]  2 mL  11  . brimonidine-timolol (COMBIGAN) 0.2-0.5 % ophthalmic solution Place 1 drop into both eyes 2 (two) times daily. To affected eye  10 mL  11  . Ferrous Fumarate-Folic Acid (HEMOCYTE-F) 324-1 MG TABS Take 1 tablet by mouth daily.  30 each  1  . glucose blood (ACCU-CHEK SMARTVIEW) test strip Check blood sugar up to 4 times daily.  150 each  12  . insulin glargine (LANTUS) 100 UNIT/ML injection Inject 50 Units into the skin at bedtime.  10 mL  11  . Insulin Pen Needle (SURE COMFORT PEN NEEDLES) 31G X 8 MM MISC Inject 1 Container into the skin 2 (two) times daily. Use with insulin pen as directed  100 each  11  . Insulin Syringe-Needle U-100 (  PRODIGY INSULIN SYRINGE) 31G X 5/16" 0.5 ML MISC Inject 1 Device into the skin at bedtime. Use to inject insulin once daily  5 each  11  . Insulin Syringe-Needle U-100 30G X 5/16" 1 ML MISC Use it for injecting insulin once daily  100 each  11  . Lancets (ACCU-CHEK SOFT TOUCH) lancets Check your blood glucose 2 times a day before meals  100 each  11  . prednisoLONE acetate (PRED FORTE) 1 % ophthalmic suspension Place 1 drop into both eyes daily.  10 mL  11  . [DISCONTINUED] insulin glargine (LANTUS) 100 UNIT/ML injection Inject 65 Units into the skin every morning.  10 mL  11   No family history on file. History   Social History  .  Marital Status: Married    Spouse Name: N/A    Number of Children: N/A  . Years of Education: N/A   Social History Main Topics  . Smoking status: Never Smoker   . Smokeless tobacco: Never Used  . Alcohol Use: No  . Drug Use: No  . Sexually Active: Not Currently   Other Topics Concern  . None   Social History Narrative   Pt. plans to sue [employer] for testing supplies [phone] Mitchell Heir July 28,2010 2:10PMPt is [place] Arabic.    Review of Systems: 10 pt ROS performed, pertinent positives and negatives noted in HPI   Objective:  Physical Exam: Filed Vitals:   03/24/12 1433  BP: 111/73  Pulse: 95  Temp: 98.5 F (36.9 C)  TempSrc: Oral  Height: 5\' 5"  (1.651 m)  Weight: 192 lb 9.6 oz (87.363 kg)  SpO2: 100%   Constitutional: Vital signs reviewed.  Patient is a well-developed and well-nourished female in no acute distress and cooperative with exam. Alert and oriented x3.  Head: Normocephalic and atraumatic Mouth: no erythema or exudates, MMM Eyes: Anisocoria, L pupil does not constrict to direct or contralateral light. EOMI, conjunctivae normal, No scleral icterus.  Neck: Thyroid feels slightly enlarged to me. No nodules, no tenderness. Supple, Trachea midline normal ROM, No JVD. Cardiovascular: RRR, S1 normal, S2 normal, no MRG, pulses symmetric and intact bilaterally Pulmonary/Chest: CTAB, no wheezes, rales, or rhonchi Abdominal: Soft. Non-tender, non-distended, bowel sounds are normal, no masses, organomegaly, or guarding present.  Musculoskeletal: No joint deformities, erythema, or stiffness, ROM full and no nontender Hematology: no cervical LAD.  Neurological: A&O x3, Strength is normal and symmetric bilaterally, cranial nerve II-XII are grossly intact, no focal motor deficit, sensory intact to light touch bilaterally.  Skin: Warm, dry and intact. No rash, cyanosis, or clubbing.  Psychiatric: Normal mood and affect.    Assessment & Plan:

## 2012-03-24 NOTE — Assessment & Plan Note (Signed)
Pt w anisocoria, L pupil will not constrict to direct light or light in contralateral pupil. Patient followed by ophthalmologist for L eye glaucoma. No increase in pain or acute decrease in vision of L eye. Instructed her to call ophthalmologist and schedule follow-up appt.

## 2012-03-24 NOTE — Assessment & Plan Note (Addendum)
Lab Results  Component Value Date   HGBA1C 12.5 03/24/2012   HGBA1C 10.3 01/31/2010   CREATININE 0.49* 02/26/2011   CREATININE 0.58 07/18/2009   MICROALBUR 2.23* 06/25/2011   MICRALBCREAT 19.2 06/25/2011   CHOL 203* 12/03/2011   HDL 54 12/03/2011   TRIG 106 12/03/2011    Last eye exam and foot exam:    Component Value Date/Time   HMDIABEYEEXA appt. w/Dr. Lottie Dawson at [facility] 09/13/09 08/29/2009   HMDIABFOOTEX done 04/24/2009    Assessment: Diabetes control: not controlled Progress toward goals: deteriorated Barriers to meeting goals: lack of insurance, nonadherence to medications and language  Plan: Diabetes treatment: Patient has been prescribed multiple types of insulin, including Lantus, nph, and mealtime coverage. She previously been without Lantus about a month over the past 3 months due to health insurance running out and affordability issues. Her last clinic visit she was prescribed NPH to take in place of Lantus; however it appears that she misunderstood dosing instructions and was taking NPH as mealtime dosing 4 units twice a day. Today she says she's been without NPH insulin for one month. Over the past month, she is only been taking Lantus 42 units daily. Will increase Lantus today to 50 units nightly as patient has tolerated cumulative dose is a 50 units of insulin without hypoglycemia. There appears to be a lot of confusion about dosing of multi-insulin regimen. Will check a metabolic panel today, and if patient has good renal function will add on metformin to Lantus therapy. Patient expressed understanding of this plan. Called pharmacy to reconcile insulin regimen.  Refer to: none Instruction/counseling given: reminded to get eye exam, reminded to bring blood glucose meter & log to each visit, reminded to bring medications to each visit and discussed diet

## 2012-03-24 NOTE — Assessment & Plan Note (Signed)
Lab Results  Component Value Date   CHOL 203* 12/03/2011   HDL 54 12/03/2011   LDLCALC 128* 12/03/2011   TRIG 106 12/03/2011   CHOLHDL 3.8 12/03/2011   Patient with worsening LDL, seeking pravastatin 40 mg daily. As the patient now has Medicaid will change to higher potency statin of Lipitor 20 mg daily to be titrated up as tolerated. Will recheck lipid panel next visit

## 2012-03-24 NOTE — Patient Instructions (Signed)
1. STOP TAKING Novolog. 2. INCREASE your Lantus to 50 units at bedtime. 3. CHANGE your cholesterol medicine to atorvastatin (Lipitor) 20mg  daily 4. Come back to see me in 3 months.  5. Call your eye doctor to make a follow up appointment.

## 2012-03-24 NOTE — Assessment & Plan Note (Signed)
Patient reports frontotemporal hair thinning. Denies any recent febrile illness, hospitalization, or stressor that may have provoked a telogen effluvium. Her thyroid feels slightly enlarged me today, but is nontender. Furthermore, she has no additional symptoms of hyperthyroidism such as weight gain, constipation, or fatigue. -Will check a TSH

## 2012-03-26 NOTE — Progress Notes (Signed)
INTERNAL MEDICINE TEACHING ATTENDING ADDENDUM - Inez Catalina, MD: I reviewed with the resident Ziemer, Ms. Maiorana's  medical history, physical examination, diagnosis and results of tests and treatment and I agree with the patient's care as documented.

## 2012-04-03 ENCOUNTER — Telehealth: Payer: Self-pay | Admitting: *Deleted

## 2012-04-03 ENCOUNTER — Ambulatory Visit (INDEPENDENT_AMBULATORY_CARE_PROVIDER_SITE_OTHER): Payer: Medicaid Other | Admitting: Radiation Oncology

## 2012-04-03 ENCOUNTER — Encounter: Payer: Self-pay | Admitting: Radiation Oncology

## 2012-04-03 VITALS — BP 112/75 | HR 91 | Temp 97.8°F | Ht 66.0 in | Wt 193.7 lb

## 2012-04-03 DIAGNOSIS — J069 Acute upper respiratory infection, unspecified: Secondary | ICD-10-CM

## 2012-04-03 MED ORDER — GUAIFENESIN-CODEINE 100-10 MG/5ML PO SYRP: 5.0000 mL | ORAL_SOLUTION | Freq: Three times a day (TID) | ORAL | Status: DC | PRN

## 2012-04-03 NOTE — Telephone Encounter (Signed)
Pt's son calls and states pt has been sick since getting flu shot, coughing, fever, chills. Cannot determine through him if pt is having other problems. appt scheduled per [place]. 1545 dr Lavena Bullion

## 2012-04-03 NOTE — Patient Instructions (Signed)

## 2012-04-04 DIAGNOSIS — J069 Acute upper respiratory infection, unspecified: Secondary | ICD-10-CM | POA: Insufficient documentation

## 2012-04-04 NOTE — Progress Notes (Signed)
  Subjective:    [name], female    DOB: 1970/07/12, 41 y.o.   MRN: [id number]  HPI This is a 41 year old Arabic speaking woman with past history of type 2 diabetes, hyperlipidemia, (for comprehensive PMH, see "Problem List") who presents to clinic for evaluations of malaise, cough, and nasal congestion. Patient states the symptoms have been persisting for approximately 9 days, and have improved somewhat during that time. Patient states she has had a subjective fever, but did not actually take her temperature at home. She also complains of occasional bitemporal headaches for the same period of time, which also have improved somewhat. She states the cough has been mostly dry with occasional clear sputum production. She denies any chills. Denies myalgias. Denies any sick contacts. She denies any nausea, vomiting, diarrhea, abdominal pain, or changes in bowel movements. Patient states she's been taking Tylenol home, which has been helping relieve her malaise and subjective fevers. She has no other complaints today.    Review of Systems  Constitutional: Positive for fever. Negative for chills.  HENT: Positive for congestion, rhinorrhea and postnasal drip.   Eyes: Negative.   Respiratory: Negative for shortness of breath and wheezing.   Cardiovascular: Negative for chest pain and leg swelling.  Gastrointestinal: Negative for nausea, vomiting, abdominal pain and diarrhea.  Genitourinary: Negative.   Musculoskeletal: Negative.   Skin: Negative.   Neurological: Positive for headaches.  Hematological: Negative.   Psychiatric/Behavioral: Negative.        Objective:   Physical Exam  Constitutional: She is oriented to person, place, and time. She appears well-developed and well-nourished. No distress.  HENT:  Head: Normocephalic and atraumatic.  Right Ear: External ear normal.  Left Ear: External ear normal.  Mouth/Throat: Oropharynx is clear and moist. No oropharyngeal exudate.   Eyes: Conjunctivae normal and EOM are normal. Pupils are equal, round, and reactive to light. Right eye exhibits no discharge. Left eye exhibits no discharge. No scleral icterus.  Neck: Normal range of motion. Neck supple. No tracheal deviation present.  Cardiovascular: Normal rate and regular rhythm.   No murmur heard. Pulmonary/Chest: Effort normal. She has no wheezes. She has no rales.  Abdominal: Soft. Bowel sounds are normal. She exhibits no distension. There is no tenderness.  Musculoskeletal: Normal range of motion. She exhibits no edema.  Neurological: She is alert and oriented to person, place, and time. No cranial nerve deficit.  Skin: Skin is warm and dry. No erythema.  Psychiatric: She has a normal mood and affect. Her behavior is normal.          Assessment & Plan:

## 2012-04-04 NOTE — Assessment & Plan Note (Signed)
Patient's symptoms are consistent with a viral URI. Physical exam and vital signs are both entirely within normal limits. Patient was reassured that her symptoms should be self-limited, and should continue to improve with supportive care(staying hydrated, Tylenol for headache/fever, etc.).  -Guaifenesin-codeine cough syrup PRN -Tylenol 650mg  q6h PRN  Filed Vitals:   04/03/12 1553  BP: 112/75  Pulse: 91  Temp: 97.8 F (36.6 C)

## 2012-04-19 ENCOUNTER — Other Ambulatory Visit: Payer: Self-pay | Admitting: Internal Medicine

## 2012-05-27 ENCOUNTER — Telehealth: Payer: Self-pay | Admitting: *Deleted

## 2012-05-27 NOTE — Telephone Encounter (Signed)
Pt stated she had talked to Dr Heloise Beecham, at her last OV, about a letter stating her sister may visit her here in the [place] I will forward this message to her.

## 2012-05-27 NOTE — Telephone Encounter (Signed)
Message copied by Hassan Buckler on Wed May 27, 2012  2:59 PM ------      Message from: Ignacia Palma      Created: Wed May 27, 2012  2:38 PM       I have never had this conversation with her an am not sure what letter she is referring to. I do not have the authority to request that sister come to [place]. If this is request of a letter of support for immigration then I will need more information (who to send it to, content desired, etc). This is the first that I have heard of it.      I tried one call with no response. I am working nights this month. If you can get in touch with her for more information, I would appreciate it.      Thank you,      Bronson Curb      ----- Message -----         From: Hassan Buckler, RN         Sent: 05/27/2012  10:57 AM           To: Bronson Curb, MD            Hi Dr Heloise Beecham, pt stated she had talked to u at her last OV about a letter being written stating her sister can visit her in the [place] ??? She came today asking about this letter. Cathie Hoops can call her at home.       Thanks [employer]

## 2012-05-27 NOTE — Telephone Encounter (Signed)
I called the pt and spoke to her son, who speaks [place]. He stated pt wants her younger sister,who lives in [place], to come here to help take care of her. But needs a letter written to the [place] [employer]. He said you can call him anytime and will explain what they need. Telephone # [phone]. Thanks

## 2012-05-28 ENCOUNTER — Encounter: Payer: Self-pay | Admitting: Internal Medicine

## 2012-05-28 NOTE — Telephone Encounter (Signed)
Called and spoke w pt's son, Arbutus Ped. Letter written, printed, and dropped off at counter behind front desk for son to pick up Friday. Thank you!

## 2012-07-07 ENCOUNTER — Encounter: Payer: Self-pay | Admitting: Internal Medicine

## 2012-07-07 ENCOUNTER — Ambulatory Visit (INDEPENDENT_AMBULATORY_CARE_PROVIDER_SITE_OTHER): Payer: Medicaid Other | Admitting: Internal Medicine

## 2012-07-07 ENCOUNTER — Telehealth: Payer: Self-pay | Admitting: Internal Medicine

## 2012-07-07 VITALS — BP 131/77 | HR 87 | Temp 97.7°F | Ht 66.0 in | Wt 179.3 lb

## 2012-07-07 DIAGNOSIS — Z79899 Other long term (current) drug therapy: Secondary | ICD-10-CM

## 2012-07-07 DIAGNOSIS — E119 Type 2 diabetes mellitus without complications: Secondary | ICD-10-CM

## 2012-07-07 DIAGNOSIS — IMO0001 Reserved for inherently not codable concepts without codable children: Secondary | ICD-10-CM

## 2012-07-07 DIAGNOSIS — E785 Hyperlipidemia, unspecified: Secondary | ICD-10-CM

## 2012-07-07 LAB — LIPID PANEL
Cholesterol: 176 mg/dL (ref 0–200)
HDL: 56 mg/dL (ref >39)
Total CHOL/HDL Ratio: 3.1 Ratio
Triglycerides: 138 mg/dL (ref <150)

## 2012-07-07 LAB — POCT GLYCOSYLATED HEMOGLOBIN (HGB A1C): Hemoglobin A1C: 11.9

## 2012-07-07 LAB — GLUCOSE, CAPILLARY

## 2012-07-07 MED ORDER — METFORMIN HCL 500 MG PO TABS: 500.0000 mg | ORAL_TABLET | Freq: Two times a day (BID) | ORAL | Status: DC

## 2012-07-07 NOTE — Patient Instructions (Addendum)
1. Please take your Insulin every night 50 units. 2. Please check your sugar every morning before eating. 3. Please come back and see me in 1 week and bring your blood sugar meter. 4. Please start taking a new medicine, metformin, for your diabetes. Take one pill twice a day with meals.  5. Please drink plenty of water.

## 2012-07-07 NOTE — Progress Notes (Signed)
[name], female   DOB: Feb 01, 1971, 42 y.o.   MRN: [id number]  Subjective:   [name] female   DOB: 1970/07/13 42 y.o.   MRN: [id number]  HPI: [name] is a 42 y.o. Arabic-speaking female with past medical history of type 2 diabetes mellitus and hyperlipidemia who presents for followup. She has history of poorly controlled DM with poor adherence to medical therapies in the past. At our last visit 3 months ago, she was taking basal and bolus insulins sporadically, with much confusion about when to use which insulin. I suspect language barrier (Arabic speaking only) playing a large part in the confusion. She had also for a while lost Medicaid coverage and could not afford Lantus. She regained her medicaid coverage and I simplified her regimen at that time to only basal insulin, Lantus 50U nightly. She reports taking her Lantus for 2 months but then taking no insulin or any therapy for DM over the past 4 weeks due to unfortunate circumstances in her personal life. In the past month, her mother and brother passed away, unexpectedly. She went home to the [place] for 2 weeks. She had been grieving their deaths over the past month, and reports that only today did she refill her Lantus and took 50U one hour prior to coming to her appointment. She does report personal support system of [place] friends who are helping her through this time of grief. Lives with children at home, sister in the area. Denies SI/HI.  She has checked her blood sugar 4 times in the past 4 weeks, all with values 250-350. Reports overall increased fatigue some polyuria. Denies thirst, HA, visual changes, dizziness, syncope, abdominal pain, N/V/D.  Reports that she has her annual eye exam later this month.    Past Medical History  Diagnosis Date  . Routine/ritual circumcision   . Anemia, iron deficiency   . Glaucoma associated with ocular inflammation   . Uveitis   . Decreased visual acuity      Left eye  . Hair loss   . Hyperlipidemia   . Pap smear abnormality of cervix with LGSIL   . Type II diabetes mellitus    Current Outpatient Prescriptions  Medication Sig Dispense Refill  . acetaminophen (TYLENOL) 325 MG tablet Take 650 mg by mouth every 6 (six) hours as needed.      Marland Kitchen atorvastatin (LIPITOR) 20 MG tablet Take 1 tablet (20 mg total) by mouth daily.  30 tablet  11  . atropine 1 % ophthalmic solution Place 1 drop into both eyes 2 (two) times daily. Prescribed by [facility]mologist at [facility]  2 mL  11  . brimonidine-timolol (COMBIGAN) 0.2-0.5 % ophthalmic solution Place 1 drop into both eyes 2 (two) times daily. To affected eye  10 mL  11  . Ferrous Fumarate-Folic Acid (HEMOCYTE-F) 324-1 MG TABS Take 1 tablet by mouth daily.  30 each  1  . glucose blood (ACCU-CHEK SMARTVIEW) test strip Check blood sugar up to 4 times daily.  150 each  12  . guaiFENesin-codeine (ROBITUSSIN AC) 100-10 MG/5ML syrup Take 5 mLs by mouth 3 (three) times daily as needed for cough.  120 mL  0  . insulin glargine (LANTUS) 100 UNIT/ML injection Inject 50 Units into the skin at bedtime.  10 mL  11  . Insulin Pen Needle (SURE COMFORT PEN NEEDLES) 31G X 8 MM MISC Inject 1 Container into the skin 2 (two) times daily. Use with insulin pen as directed  100 each  11  . Insulin Syringe-Needle U-100 30G X 5/16" 1 ML MISC Use it for injecting insulin once daily  100 each  11  . Lancets (ACCU-CHEK SOFT TOUCH) lancets Check your blood glucose 2 times a day before meals  100 each  11  . metFORMIN (GLUCOPHAGE) 500 MG tablet Take 1 tablet (500 mg total) by mouth 2 (two) times daily with a meal.  60 tablet  0  . prednisoLONE acetate (PRED FORTE) 1 % ophthalmic suspension Place 1 drop into both eyes daily.  10 mL  11  . RELION INSULIN SYR 0.5ML/31G 31G X 5/16" 0.5 ML MISC INJECT 1 DEVICE INTO THE SKIN AT BETIME. USE TO INJECT INSULIN ONCE DAILY  100 each  10   No current facility-administered medications for this visit.    No family history on file. History   Social History  . Marital Status: Married    Spouse Name: N/A    Number of Children: N/A  . Years of Education: N/A   Social History Main Topics  . Smoking status: Never Smoker   . Smokeless tobacco: Never Used  . Alcohol Use: No  . Drug Use: No  . Sexually Active: Not Currently   Other Topics Concern  . None   Social History Narrative   Pt. plans to sue [employer] for testing supplies [phone] Mitchell Heir July 28,2010 2:10PM      Pt is [place] Arabic.    Review of Systems: 10 pt ROS performed, pertinent positives and negatives noted in HPI Objective:  Physical Exam: Filed Vitals:   07/07/12 1547  BP: 131/77  Pulse: 87  Temp: 97.7 F (36.5 C)  TempSrc: Oral  Height: 5\' 6"  (1.676 m)  Weight: 179 lb 4.8 oz (81.33 kg)  SpO2: 100%   Constitutional: Vital signs reviewed. Patient is a well-developed and well-nourished female in no acute distress and cooperative with exam. Alert and oriented x3.  Head: Normocephalic and atraumatic  Mouth: no erythema or exudates, MMM  Eyes: Anisocoria, L pupil does not constrict to direct or contralateral light. EOMI, conjunctivae normal, No scleral icterus.  Neck: Supple, Trachea midline normal ROM, No JVD.  Cardiovascular: RRR, S1 normal, S2 normal, no MRG, pulses symmetric and intact bilaterally  Pulmonary/Chest: CTAB, no wheezes, rales, or rhonchi  Abdominal: Soft. Non-tender, non-distended, bowel sounds are normal, no masses, organomegaly, or guarding present.  Musculoskeletal: No joint deformities, erythema, or stiffness, ROM full and no nontender Hematology: no cervical LAD.  Neurological: A&O x3, Strength is normal and symmetric bilaterally, cranial nerve II-XII are grossly intact, no focal motor deficit, sensory intact to light touch bilaterally.  Skin: Warm, dry and intact. No rash, cyanosis, or clubbing.  Psychiatric: Depressed mood and affect.   Assessment & Plan:  Focused on  acute issues this appointment due to patient grief, fatigue. Ordered basic labs, Will see her back in 1 week. Will need a pap smear at that time.   Please see problem-based charting for remainder of assessment and plan.

## 2012-07-07 NOTE — Telephone Encounter (Signed)
[facility] lab called with CBG of 530. No anion gap noted on CMET. She was also noted to have CBG of 519 in the clinic, seen by Dr, Heloise Beecham today and took her lantus 1 hour before coming to the clinic.   Plan:  Agree with Dr. Kathaleen Maser paln- continuing lantus and titrating her metformin.  Thanks, [employer]

## 2012-07-07 NOTE — Assessment & Plan Note (Addendum)
Lab Results  Component Value Date   HGBA1C 11.9 07/07/2012   HGBA1C 12.5 03/24/2012   HGBA1C 10.6 12/03/2011     Assessment: Patient has not been taking lantus for the past month due to travel and grief over multiple deaths in the family. She remains persistently hyperglycemic. Symptoms mild at this time, does not appear overtly dehydrated or toxic. Blood sugar is 519 in clinic, and patient took 50U lantus 1 hour before coming to the clinic. As she just took insulin therapy 1 hour prior to visit after being without for 1 month will not give bolus insulin now.  She would likely benefit most from multi-insulin basal and bolus regimen but has had much confusion with these in the past. As such, I have been trying to get her under better control with basal coverage. The fact that her HbA1c has not increased over the past 3 months after not using insulin at all over this most recent month makes me think that she was much better controlled while actually taking lantus as prescribed. I have asked her to check her blood sugars at least every morning for the next week and take her lantus every night. I will see her back in one week. In the meantime, I am checking labwork (CMet, Urine microalbumin, lipids).  We will discuss more intense disease management at this visit. It is clear that she is tearful and grieving today. She agrees to make these changes and see me in 1 week. Alarm sx of hyperglycemia reviewed.  In the meantime, I will add back metformin 500mg  BID to her basal lantus (50U).    Diabetes control:  Uncontrolled  Progress toward A1C goal:   Incremental improvement  Comments: Difficult to assess full response to insulin therapy as she has only taken insulin for the past 2/3 months.   Plan:  Medications:  Continue Lantus 50U nightly, add metformin 500mg  BID to be uptitrated  Home glucose monitoring:   Frequency:  Daily   Timing:  Before breakfast  Instruction/counseling given: reminded to get eye  exam and reminded to bring blood glucose meter & log to each visit  Educational resources provided: brochure  Self management tools provided: copy of home glucose meter download    Case discussed with Dr. Jamison Oka Bmet reveals blood glucose of 560 and pseudohyponatremia which corrects for blood glucose. Anion gap is 12, bicarb is 24. Blood sugar from labs represents 1 month of untreated diabetes. Day of clinic visit she took insulin. Will recheck Bmet next week.

## 2012-07-08 LAB — COMPLETE METABOLIC PANEL WITH GFR
Albumin: 3.9 g/dL (ref 3.5–5.2)
Alkaline Phosphatase: 101 U/L (ref 39–117)
BUN: 13 mg/dL (ref 6–23)
GFR, Est Non African American: >89 mL/min
Glucose, Bld: 530 mg/dL (ref 70–99)
Potassium: 4.9 mEq/L (ref 3.5–5.3)
Total Bilirubin: 0.2 mg/dL — ABNORMAL LOW (ref 0.3–1.2)

## 2012-07-08 LAB — MICROALBUMIN / CREATININE URINE RATIO: Microalb, Ur: 0.5 mg/dL (ref 0.00–1.89)

## 2012-07-09 NOTE — Assessment & Plan Note (Addendum)
Lab Results  Component Value Date   CHOL 176 07/07/2012   HDL 56 07/07/2012   LDLCALC 92 07/07/2012   TRIG 138 07/07/2012   CHOLHDL 3.1 07/07/2012   Much better control with simvastatin. Normal liver function. Continue therapy.

## 2012-07-15 ENCOUNTER — Encounter: Payer: Self-pay | Admitting: Internal Medicine

## 2012-07-15 ENCOUNTER — Ambulatory Visit (INDEPENDENT_AMBULATORY_CARE_PROVIDER_SITE_OTHER): Payer: Medicaid Other | Admitting: Internal Medicine

## 2012-07-15 VITALS — BP 107/70 | HR 88 | Temp 97.0°F | Ht 66.0 in | Wt 183.2 lb

## 2012-07-15 DIAGNOSIS — E119 Type 2 diabetes mellitus without complications: Secondary | ICD-10-CM

## 2012-07-15 MED ORDER — METFORMIN HCL 500 MG PO TABS: 1000.0000 mg | ORAL_TABLET | Freq: Two times a day (BID) | ORAL | Status: DC

## 2012-07-15 NOTE — Progress Notes (Signed)
[name], female   DOB: [birth year], 42 y.o.   MRN: [id number]  Subjective:   [name] female   DOB: 08-05-1970 42 y.o.   MRN: [id number]  HPI: Jennifer [name] is a 42 y.o. Arabic-speaking female with past medical history of type 2 diabetes mellitus and hyperlipidemia who presents for followup. Her diabetes control has been poor and she has had a complicated prior medical regimen. In brief, 3 months ago she was taking basal and bolus insulins sporadically, with much confusion about when to use which insulin. I suspect language barrier (Arabic speaking only) playing a large part in the confusion. She had also for a while lost Medicaid coverage and could not afford Lantus. She regained her medicaid coverage and I simplified her regimen at that time to only basal insulin, Lantus 50U nightly.  She returned to clinic last week and had not been taking any of her insulin for a month since traveling to the [place]. She has had 2 close family members passed away. When she came to the clinic, her blood sugar was in the 500s. I instructed her to resume Lantus as well start taking metformin twice a day. She's also been checking her blood sugars. She is noted much improved control of blood glucose with fasting values between 90 and 200. She's had no symptomatic hypoglycemia. She is feeling much better since getting her sugar under control. She reports that she has a supportive family and friends helping her now. Denies polyuria, thirst, HA, visual changes, dizziness, syncope, abdominal pain, N/V/D. Says that she has an appointment with her eye doctor on Friday.   Past Medical History  Diagnosis Date  . Routine/ritual circumcision   . Anemia, iron deficiency   . Glaucoma associated with ocular inflammation   . Uveitis   . Decreased visual acuity     Left eye  . Hair loss   . Hyperlipidemia   . Pap smear abnormality of cervix with LGSIL   . Type II diabetes mellitus     Current Outpatient Prescriptions  Medication Sig Dispense Refill  . acetaminophen (TYLENOL) 325 MG tablet Take 650 mg by mouth every 6 (six) hours as needed.      Marland Kitchen atorvastatin (LIPITOR) 20 MG tablet Take 1 tablet (20 mg total) by mouth daily.  30 tablet  11  . atropine 1 % ophthalmic solution Place 1 drop into both eyes 2 (two) times daily. Prescribed by ophthalmologist at [facility]  2 mL  11  . brimonidine-timolol (COMBIGAN) 0.2-0.5 % ophthalmic solution Place 1 drop into both eyes 2 (two) times daily. To affected eye  10 mL  11  . Ferrous Fumarate-Folic Acid (HEMOCYTE-F) 324-1 MG TABS Take 1 tablet by mouth daily.  30 each  1  . glucose blood (ACCU-CHEK SMARTVIEW) test strip Check blood sugar up to 4 times daily.  150 each  12  . guaiFENesin-codeine (ROBITUSSIN AC) 100-10 MG/5ML syrup Take 5 mLs by mouth 3 (three) times daily as needed for cough.  120 mL  0  . insulin glargine (LANTUS) 100 UNIT/ML injection Inject 50 Units into the skin at bedtime.  10 mL  11  . Insulin Pen Needle (SURE COMFORT PEN NEEDLES) 31G X 8 MM MISC Inject 1 Container into the skin 2 (two) times daily. Use with insulin pen as directed  100 each  11  . Insulin Syringe-Needle U-100 30G X 5/16" 1 ML MISC Use it for injecting insulin once daily  100 each  11  . Lancets (ACCU-CHEK SOFT TOUCH) lancets Check your blood glucose 2 times a day before meals  100 each  11  . metFORMIN (GLUCOPHAGE) 500 MG tablet Take 2 tablets (1,000 mg total) by mouth 2 (two) times daily with a meal.  120 tablet  5  . prednisoLONE acetate (PRED FORTE) 1 % ophthalmic suspension Place 1 drop into both eyes daily.  10 mL  11  . RELION INSULIN SYR 0.5ML/31G 31G X 5/16" 0.5 ML MISC INJECT 1 DEVICE INTO THE SKIN AT BETIME. USE TO INJECT INSULIN ONCE DAILY  100 each  10   No current facility-administered medications for this visit.   No family history on file. History   Social History  . Marital Status: Married    Spouse Name: N/A    Number of  Children: N/A  . Years of Education: N/A   Social History Main Topics  . Smoking status: Never Smoker   . Smokeless tobacco: Never Used  . Alcohol Use: No  . Drug Use: No  . Sexually Active: Not Currently   Other Topics Concern  . None   Social History Narrative   Pt. plans to sue [facility] for testing supplies [phone] Mitchell Heir July 28,2010 2:10PM      Pt is [place] Arabic.    Review of Systems: 10 pt ROS performed, pertinent positives and negatives noted in HPI Objective:  Physical Exam: Filed Vitals:   07/15/12 0911  BP: 107/70  Pulse: 88  Temp: 97 F (36.1 C)  TempSrc: Oral  Height: 5\' 6"  (1.676 m)  Weight: 183 lb 3.2 oz (83.099 kg)  SpO2: 100%   Constitutional: Vital signs reviewed. Patient is a well-developed and well-nourished female in no acute distress and cooperative with exam. Alert and oriented x3.  Head: Normocephalic and atraumatic  Mouth: no erythema or exudates, MMM  Eyes: Anisocoria, L pupil does not constrict to direct or contralateral light. EOMI, conjunctivae normal, No scleral icterus.  Neck: Supple, no masses Cardiovascular: RRR, S1 normal, S2 normal, no MRG, pulses symmetric and intact bilaterally  Pulmonary/Chest: CTAB, no wheezes, rales, or rhonchi  Abdominal: Soft. Non-tender, non-distended, bowel sounds are normal, no masses, organomegaly, or guarding present.  Musculoskeletal: No joint deformities, erythema, or stiffness, ROM full and no nontender Hematology: no cervical LAD.  Neurological: A&O x3, Strength is normal and symmetric bilaterally, cranial nerve II-XII are grossly intact, no focal motor deficit, sensory intact to light touch bilaterally.  Skin: Warm, dry and intact. No rash, cyanosis, or clubbing.  Psychiatric: Normal mood and affect.  Assessment & Plan:   Please see problem-based charting for assessment and plan.

## 2012-07-15 NOTE — Assessment & Plan Note (Addendum)
Lab Results  Component Value Date   HGBA1C 11.9 07/07/2012   HGBA1C 12.5 03/24/2012   HGBA1C 10.6 12/03/2011    Since her last visit, she has been taking Lantus 50 units and metformin 500 twice a day. Her blood glucose meter reveals much better glycemic control with fasting glucoses 90-200. I congratulated her on her progress and encouraged her to continue with medication adherence. She says she's feeling much better and is encouraged by her progress today. No evidence of proteinuria on microalbumin screening last week. I will see her back in 3 weeks' time. She has appt for eye exam w Dr. Dione Booze on Friday.   Assessment:  Diabetes control: poor control (HgbA1C >9%)  Progress toward A1C goal:  improved    Plan:  Medications:  Continue Lantus 50U and increase metformin to 500mg  BID. She will need to basal and bolus insulin regimen eventually, but has had significant confusion and failure with this therapy in the past. We'll continue basal insulin and metformin for the time being and reevaluate at her next clinic visit.  Home glucose monitoring:   Frequency:  2 times daily   Timing:  Before breakfast and at night.   Instruction/counseling given: reminded to get eye exam  Educational resources provided: brochure

## 2012-07-15 NOTE — Patient Instructions (Addendum)
1. GREAT JOB :-) 2. Keep taking Lantus 50 units at night. INCREASE metformin to 2 pills twice a day.  Come back and see me in 2-4 weeks

## 2012-08-04 ENCOUNTER — Encounter: Payer: Medicaid Other | Admitting: Internal Medicine

## 2012-10-05 ENCOUNTER — Other Ambulatory Visit: Payer: Self-pay | Admitting: Internal Medicine

## 2012-10-08 ENCOUNTER — Encounter: Payer: Self-pay | Admitting: Internal Medicine

## 2012-10-29 ENCOUNTER — Ambulatory Visit (INDEPENDENT_AMBULATORY_CARE_PROVIDER_SITE_OTHER): Payer: Medicaid Other | Admitting: Internal Medicine

## 2012-10-29 ENCOUNTER — Encounter: Payer: Self-pay | Admitting: Internal Medicine

## 2012-10-29 ENCOUNTER — Other Ambulatory Visit: Payer: Self-pay | Admitting: Dietician

## 2012-10-29 ENCOUNTER — Other Ambulatory Visit: Payer: Self-pay

## 2012-10-29 VITALS — BP 112/78 | HR 78 | Temp 97.7°F | Ht 66.0 in | Wt 186.9 lb

## 2012-10-29 DIAGNOSIS — E1139 Type 2 diabetes mellitus with other diabetic ophthalmic complication: Secondary | ICD-10-CM

## 2012-10-29 DIAGNOSIS — J069 Acute upper respiratory infection, unspecified: Secondary | ICD-10-CM

## 2012-10-29 DIAGNOSIS — J209 Acute bronchitis, unspecified: Secondary | ICD-10-CM

## 2012-10-29 DIAGNOSIS — E1165 Type 2 diabetes mellitus with hyperglycemia: Secondary | ICD-10-CM

## 2012-10-29 LAB — POCT GLYCOSYLATED HEMOGLOBIN (HGB A1C): Hemoglobin A1C: 12.1

## 2012-10-29 LAB — GLUCOSE, CAPILLARY: Glucose-Capillary: 276 mg/dL — ABNORMAL HIGH (ref 70–99)

## 2012-10-29 MED ORDER — GLUCOSE BLOOD VI STRP: ORAL_STRIP | Status: DC

## 2012-10-29 MED ORDER — GUAIFENESIN-CODEINE 100-10 MG/5ML PO SYRP: 5.0000 mL | ORAL_SOLUTION | Freq: Three times a day (TID) | ORAL | Status: DC | PRN

## 2012-10-29 MED ORDER — AMOXICILLIN-POT CLAVULANATE 875-125 MG PO TABS: 1.0000 | ORAL_TABLET | Freq: Two times a day (BID) | ORAL | Status: DC

## 2012-10-29 NOTE — Patient Instructions (Addendum)
General Instructions: Please take your antibiotics (augmentin) as prescribed for 5 days and use the new cough syrup Robitussin AC when you need it as prescribed  If you notice fever, chills, worsening cough and sputum, shortness of breath, difficulty breathing, you need to call us right away [phone] or go to emergency room  Please keep taking your lantus for your diabetes, you should be taking 50 units daily.  Your sugar control is not very good and you need to meet with our diabetes coordinator   Treatment Goals:  Goals (1 Years of Data) as of 10/29/12         As of Today 07/15/12 07/07/12 04/03/12 03/24/12     Blood Pressure    . Blood Pressure < 140/90  112/78 107/70 131/77 112/75 111/73     Result Component    . HEMOGLOBIN A1C < 8.0  12.1  11.9  12.5    . LDL CALC < 100    92        Progress Toward Treatment Goals:  Treatment Goal 10/29/2012  Hemoglobin A1C deteriorated    Self Care Goals & Plans:  Self Care Goal 10/29/2012  Manage my medications take my medicines as prescribed  Monitor my health keep track of my blood glucose; bring my glucose meter and log to each visit  Eat healthy foods -    Home Blood Glucose Monitoring 10/29/2012  Check my blood sugar 3 times a day  When to check my blood sugar before meals     Care Management & Community Referrals:  Referral 07/15/2012  Referrals made for care management support diabetes educator    Bronchitis Bronchitis is a problem of the air tubes leading to your lungs. This problem makes it hard for air to get in and out of the lungs. You may cough a lot because your air tubes are narrow. Going without care can cause lasting (chronic) bronchitis. HOME CARE   Drink enough fluids to keep your pee (urine) clear or pale yellow.  Use a cool mist humidifier.  Quit smoking if you smoke. If you keep smoking, the bronchitis might not get better.  Only take medicine as told by your doctor. GET HELP RIGHT AWAY IF:   Coughing  keeps you awake.  You start to wheeze.  You become more sick or weak.  You have a hard time breathing or get short of breath.  You cough up blood.  Coughing lasts more than 2 weeks.  You have a fever.  Your baby is older than 3 months with a rectal temperature of 102 F (38.9 C) or higher.  Your baby is 43 months old or younger with a rectal temperature of 100.4 F (38 C) or higher. MAKE SURE YOU:  Understand these instructions.  Will watch your condition.  Will get help right away if you are not doing well or get worse. Document Released: 09/25/2007 Document Revised: 07/01/2011 Document Reviewed: 03/10/2009 [facility] Patient Information 2014 [place], [place]. Amoxicillin; Clavulanic Acid tablets What is this medicine? AMOXICILLIN; CLAVULANIC ACID (a mox i SIL in; KLAV yoo lan ic AS id) is a penicillin antibiotic. It is used to treat certain kinds of bacterial infections. It will not work for colds, flu, or other viral infections. This medicine may be used for other purposes; ask your health care provider or pharmacist if you have questions. What should I tell my health care provider before I take this medicine? They need to know if you have any of these conditions: -  bowel disease, like colitis -kidney disease -liver disease -mononucleosis -an unusual or allergic reaction to amoxicillin, penicillin, cephalosporin, other antibiotics, clavulanic acid, other medicines, foods, dyes, or preservatives -pregnant or trying to get pregnant -breast-feeding How should I use this medicine? Take this medicine by mouth with a full glass of water. Follow the directions on the prescription label. Take at the start of a meal. Do not crush or chew. If the tablet has a score line, you may cut it in half at the score line for easier swallowing. Take your medicine at regular intervals. Do not take your medicine more often than directed. Take all of your medicine as directed even if you think you  are better. Do not skip doses or stop your medicine early. Talk to your pediatrician regarding the use of this medicine in children. Special care may be needed. Overdosage: If you think you have taken too much of this medicine contact a poison control center or emergency room at once. NOTE: This medicine is only for you. Do not share this medicine with others. What if I miss a dose? If you miss a dose, take it as soon as you can. If it is almost time for your next dose, take only that dose. Do not take double or extra doses. What may interact with this medicine? -allopurinol -anticoagulants -birth control pills -methotrexate -probenecid This list may not describe all possible interactions. Give your health care provider a list of all the medicines, herbs, non-prescription drugs, or dietary supplements you use. Also tell them if you smoke, drink alcohol, or use illegal drugs. Some items may interact with your medicine. What should I watch for while using this medicine? Tell your doctor or health care professional if your symptoms do not improve. Do not treat diarrhea with over the counter products. Contact your doctor if you have diarrhea that lasts more than 2 days or if it is severe and watery. If you have diabetes, you may get a false-positive result for sugar in your urine. Check with your doctor or health care professional. Birth control pills may not work properly while you are taking this medicine. Talk to your doctor about using an extra method of birth control. What side effects may I notice from receiving this medicine? Side effects that you should report to your doctor or health care professional as soon as possible: -allergic reactions like skin rash, itching or hives, swelling of the face, lips, or tongue -breathing problems -dark urine -fever or chills, sore throat -redness, blistering, peeling or loosening of the skin, including inside the mouth -seizures -trouble passing  urine or change in the amount of urine -unusual bleeding, bruising -unusually weak or tired -white patches or sores in the mouth or throat Side effects that usually do not require medical attention (report to your doctor or health care professional if they continue or are bothersome): -diarrhea -dizziness -headache -nausea, vomiting -stomach upset -vaginal or anal irritation This list may not describe all possible side effects. Call your doctor for medical advice about side effects. You may report side effects to FDA at 1-800-FDA-1088. Where should I keep my medicine? Keep out of the reach of children. Store at room temperature below 25 degrees C (77 degrees F). Keep container tightly closed. Throw away any unused medicine after the expiration date. NOTE: This sheet is a summary. It may not cover all possible information. If you have questions about this medicine, talk to your doctor, pharmacist, or health care provider.  2013, [employer]. (07/02/2007 12:04:30 PM)

## 2012-10-29 NOTE — Telephone Encounter (Signed)
Patient's son requested information on healthy foods for his mother to eat for her diabetes and assistance with obtaining test strips from pharmacy. Provided him with Eat this, not that, plant based food and plate method" from Learning About diabetes website. He verbalized comprehension of how to use this information to assist his mother with food shopping and eating healthier.   [facility] to get [facility] to [facility]- rx did not have Dx code or that she requires insulin, will request new prescription with this information.Then call them to follow up

## 2012-10-29 NOTE — Assessment & Plan Note (Signed)
Constant productive cough with with sputum x2 weeks with minimal improvement with otc and home remedies.  Endorses occasional chills, nausea, and vomiting, and difficulty breathing with cough especially at night.  Given poorly controlled diabetes, will treat with antibiotics and cough syrup at this time.  If no improvement or worsening will need to work up.    -augmentin x5 days -robitussin ac prn

## 2012-10-29 NOTE — Telephone Encounter (Signed)
called [facility] to be sure strips are covered now. They said that the test strips are still not covered on patients insurance and dx code is incorrect. Requested accu chek rep contact [facility] pharmacy to assist.

## 2012-10-29 NOTE — Assessment & Plan Note (Signed)
Lab Results  Component Value Date   HGBA1C 12.1 10/29/2012   HGBA1C 11.9 07/07/2012   HGBA1C 12.5 03/24/2012    Assessment: Diabetes control: poor control (HgbA1C >9%) Progress toward A1C goal:  deteriorated Comments: very poor control, needs diabetes educator session and likely needs to start insulin therapy.  Stressed diet control and modifications and need to see [employer] and improve glycemic control   Plan: Medications:  continue current medications Home glucose monitoring: Frequency: 3 times a day Timing: before meals Instruction/counseling given: reminded to get eye exam, reminded to bring blood glucose meter & log to each visit, reminded to bring medications to each visit, discussed foot care, discussed the need for weight loss and discussed diet Educational resources provided:   Self management tools provided: instructions for home glucose monitoring;home glucose logbook Other plans: follow up diabetes educator and with pcp--defer to pcp to start insulin therapy with patient, she needs extensive education first.

## 2012-10-29 NOTE — Progress Notes (Signed)
Subjective:   [name] female   DOB: 29-Mar-1971 42 y.o.   MRN: [id number]  HPI: [name] is a 42 y.o. Arabic speaking female with PMH of poorly controlled DM2 presenting to clinic today with 2 weeks of cough.  She is present in clinic today wit her son who speaks english and is translating.  They explains that she started having this cough ~2 weeks ago and has not worsened but possibly has slightly improved.  She reports occasional sputum that is white in color but denies any fever, diarrhea, sore throat, hemoptysis, wheezing, sick contacts, nasal congestion, chest pain, or abdominal pain.  She does report some chills, headaches, nausea and vomiting the first week a few times due to cough.  She has a good appetite and has been able to tolerate food.  The cough is worse at night and associated with difficulty taking a full breath due to a cough especially when lying on either side.  She has tried otc cough syrup with minimal relief.    In regards to her diabetes, her A1C shows poor control with HbA1C increased to 12.1.  She has poor insight into her diabetes and control and I have recommended to meet with diabetes educator and likely start insulin therapy given her poor control.  She needs to be educated thoroughly prior to starting insulin therapy.  In the meantime, we have discussed improved diet control, avoiding strong carbs including bread and rice as much as possible.    Past Medical History  Diagnosis Date  . Routine/ritual circumcision   . Anemia, iron deficiency   . Glaucoma associated with ocular inflammation   . Uveitis   . Decreased visual acuity     Left eye  . Hair loss   . Hyperlipidemia   . Pap smear abnormality of cervix with LGSIL   . Type II diabetes mellitus    Current Outpatient Prescriptions  Medication Sig Dispense Refill  . acetaminophen (TYLENOL) 325 MG tablet Take 650 mg by mouth every 6 (six) hours as needed.      Marland Kitchen atorvastatin (LIPITOR)  20 MG tablet Take 1 tablet (20 mg total) by mouth daily.  30 tablet  11  . atropine 1 % ophthalmic solution Place 1 drop into both eyes 2 (two) times daily. Pres[facility] by [facility] at [facility]  2 mL  11  . brimonidine-timolol (COMBIGAN) 0.2-0.5 % ophthalmic solution Place 1 drop into both eyes 2 (two) times daily. To affected eye  10 mL  11  . Ferrous Fumarate-Folic Acid (HEMOCYTE-F) 324-1 MG TABS Take 1 tablet by mouth daily.  30 each  1  . guaiFENesin-codeine (ROBITUSSIN AC) 100-10 MG/5ML syrup Take 5 mLs by mouth 3 (three) times daily as needed for cough.  120 mL  0  . Insulin Pen Needle (SURE COMFORT PEN NEEDLES) 31G X 8 MM MISC Inject 1 Container into the skin 2 (two) times daily. Use with insulin pen as directed  100 each  11  . Insulin Syringe-Needle U-100 30G X 5/16" 1 ML MISC Use it for injecting insulin once daily  100 each  11  . Lancets (ACCU-CHEK SOFT TOUCH) lancets Check your blood glucose 2 times a day before meals  100 each  11  . metFORMIN (GLUCOPHAGE) 500 MG tablet Take 2 tablets (1,000 mg total) by mouth 2 (two) times daily with a meal.  120 tablet  5  . prednisoLONE acetate (PRED FORTE) 1 % ophthalmic suspension Place 1 drop into  both eyes daily.  10 mL  11  . RELION INSULIN SYR 0.5ML/31G 31G X 5/16" 0.5 ML MISC INJECT 1 DEVICE INTO THE SKIN AT BETIME. USE TO INJECT INSULIN ONCE DAILY  100 each  10  . amoxicillin-clavulanate (AUGMENTIN) 875-125 MG per tablet Take 1 tablet by mouth 2 (two) times daily.  10 tablet  0  . glucose blood (ACCU-CHEK SMARTVIEW) test strip Check blood sugar 4 times daily  Before meal and bedtime dx code 250.52  Insulin requiring  150 each  12  . insulin glargine (LANTUS) 100 UNIT/ML injection Inject 50 Units into the skin at bedtime.  10 mL  11   No current facility-administered medications for this visit.   No family history on file. History   Social History  . Marital Status: Married    Spouse Name: N/A    Number of Children: N/A  .  Years of Education: N/A   Social History Main Topics  . Smoking status: Never Smoker   . Smokeless tobacco: Never Used  . Alcohol Use: No  . Drug Use: No  . Sexually Active: Not Currently   Other Topics Concern  . None   Social History Narrative   Pt. plans to sue [facility] for testing supplies [phone] Mitchell Heir July 28,2010 2:10PM      Pt is [place] Arabic.    Review of Systems:  Constitutional:  Chills.  Denies fever, diaphoresis, appetite change and fatigue.   HEENT:  Decreased vision L eye.  Denies congestion, sore throat, rhinorrhea, sneezing, mouth sores, trouble swallowing, neck pain   Respiratory:  Cough with occasional SOB.  Denies DOE and wheezing.   Cardiovascular:  Denies palpitations and leg swelling.   Gastrointestinal:  Nausea, vomiting.  Denies abdominal pain, diarrhea, constipation, blood in stool and abdominal distention.   Genitourinary:  Denies dysuria, urgency, frequency, hematuria, flank pain and difficulty urinating.   Musculoskeletal:  Denies myalgias, back pain, joint swelling, arthralgias and gait problem.   Skin:  Denies pallor, rash and wound.   Neurological:  Headaches.  Denies dizziness, seizures, syncope, weakness, light-headedness, numbness.     Objective:  Physical Exam: Filed Vitals:   10/29/12 0950  BP: 112/78  Pulse: 78  Temp: 97.7 F (36.5 C)  TempSrc: Oral  Height: 5\' 6"  (1.676 m)  Weight: 186 lb 14.4 oz (84.777 kg)  SpO2: 100%   Vitals reviewed. General: sitting in chair, coughing HEENT: R reacting to light but left not constricting. EOMI, L eye conjunctival injection, decreased/minimal vision L eye.  No tenderness to palpation of neck.  Cardiac: RRR, no rubs, murmurs or gallops Pulm: clear to auscultation bilaterally, no wheezes, rales, or rhonchi Abd: soft, nontender, nondistended, BS present Ext: warm and well perfused, no pedal edema, +2DP B/L Neuro: alert and oriented X3, cranial nerves II-XII grossly intact,  strength and sensation to light touch equal in bilateral upper and lower extremities  Assessment & Plan:  Discussed with Dr. Dalphine Handing Augmentin x5 days and robitussin ac Follow up with donna plyler

## 2012-10-30 ENCOUNTER — Telehealth: Payer: Self-pay | Admitting: Dietician

## 2012-10-30 NOTE — Progress Notes (Signed)
Case discussed with Dr. Qureshi at the time of the visit.  We reviewed the resident's history and exam and pertinent patient test results.  I agree with the assessment, diagnosis, and plan of care documented in the resident's note. 

## 2012-10-30 NOTE — Telephone Encounter (Signed)
[facility] still not able to get patient diabetes test strips paid for by medicaid. Called patient's son Shirline Frees and asked hi to call [facility] and have them transfer her prescription to them and call me if they are still not covered.

## 2012-11-02 ENCOUNTER — Other Ambulatory Visit: Payer: Self-pay | Admitting: *Deleted

## 2012-11-02 DIAGNOSIS — E1139 Type 2 diabetes mellitus with other diabetic ophthalmic complication: Secondary | ICD-10-CM

## 2012-11-02 DIAGNOSIS — E1165 Type 2 diabetes mellitus with hyperglycemia: Secondary | ICD-10-CM

## 2012-11-02 MED ORDER — GLUCOSE BLOOD VI STRP: ORAL_STRIP | Status: DC

## 2012-11-02 NOTE — Telephone Encounter (Signed)
[facility] unable to [facility] accuc[facility]k test strips. Left message Friday and then again today to follow up. Asked family to go to [facility] and have [facility] to have strips filled and covered by her insurance. Jennifer [name]

## 2012-12-03 ENCOUNTER — Other Ambulatory Visit: Payer: Self-pay | Admitting: Internal Medicine

## 2012-12-03 ENCOUNTER — Ambulatory Visit (INDEPENDENT_AMBULATORY_CARE_PROVIDER_SITE_OTHER): Payer: Medicaid Other | Admitting: Internal Medicine

## 2012-12-03 ENCOUNTER — Ambulatory Visit (INDEPENDENT_AMBULATORY_CARE_PROVIDER_SITE_OTHER): Payer: Medicaid Other | Admitting: Dietician

## 2012-12-03 ENCOUNTER — Encounter: Payer: Self-pay | Admitting: Internal Medicine

## 2012-12-03 VITALS — BP 111/80 | HR 94 | Temp 97.0°F | Ht 64.5 in | Wt 184.9 lb

## 2012-12-03 VITALS — Wt 184.9 lb

## 2012-12-03 DIAGNOSIS — E1165 Type 2 diabetes mellitus with hyperglycemia: Secondary | ICD-10-CM

## 2012-12-03 DIAGNOSIS — E1139 Type 2 diabetes mellitus with other diabetic ophthalmic complication: Secondary | ICD-10-CM

## 2012-12-03 DIAGNOSIS — E785 Hyperlipidemia, unspecified: Secondary | ICD-10-CM

## 2012-12-03 MED ORDER — INSULIN GLARGINE 100 UNIT/ML ~~LOC~~ SOLN: 55.0000 [IU] | Freq: Every day | SUBCUTANEOUS | Status: DC

## 2012-12-03 NOTE — Patient Instructions (Addendum)
General Instructions: Start taking Lantus 55 units in the AM instead of 50 units.  Please make an appointment with Norm Parcel for a glucometer check in 2 weeks. If your blood sugar is still very high, we will increase your Lantus to 60 units at that time.  Please make an appointment to see me in 1 month. Please make sure you go to the eye doctor later this month and bring the reoprt from that visit when you come and see me. Check your feet for sores and ulcers weekly to avoid diabetic foot infections.  Treatment Goals:  Goals (1 Years of Data) as of 12/03/12         As of Today 10/29/12 07/15/12 07/07/12 04/03/12     Blood Pressure    . Blood Pressure < 140/90  111/80 112/78 107/70 131/77 112/75     Result Component    . HEMOGLOBIN A1C < 8.0   12.1  11.9     . LDL CALC < 100     92       Progress Toward Treatment Goals:  Treatment Goal 12/03/2012  Hemoglobin A1C deteriorated    Self Care Goals & Plans:  Self Care Goal 12/03/2012  Manage my medications take my medicines as prescribed; bring my medications to every visit; refill my medications on time  Monitor my health keep track of my blood glucose  Eat healthy foods drink diet soda or water instead of juice or soda; eat more vegetables; eat foods that are low in salt; eat baked foods instead of fried foods  Be physically active find an activity I enjoy; take a walk every day    Home Blood Glucose Monitoring 12/03/2012  Check my blood sugar 3 times a day  When to check my blood sugar before breakfast; before lunch; after lunch; after dinner     Care Management & Community Referrals:  Referral 12/03/2012  Referrals made for care management support diabetes educator

## 2012-12-03 NOTE — Assessment & Plan Note (Signed)
Jennifer [name] has been seen many times in our clinic for her poorly controlled DM. Her last A1c on 10/29/12 was 12.1. She has been on Lantus 50 units in AM and Metformin 1000 bid. Her blood sugars average in the high 200's. Today we discussed all possible options for her in terms of optimizing her blood sugar. She has a history of non-compliance with mealtime insulin and does not want to do that as it interferes with her lifestyle. All complications of uncontrolled diabetes were explained. She saw an eye doctor 4 months ago and she does in fact have eye changes secondary to her poor glucose control.  We have agreed to increase her Lantus dose to 55 units and come back in 2 weeks to have her sugars checked with [employer]. At that time, if her sugars are still extremely high, we will increase her Lantus to 60 units. She is to return in 1 month to review her new regimen.

## 2012-12-03 NOTE — Progress Notes (Signed)
Diabetes Self-Management Education  Visit Number: Follow-up 3  12/03/2012 [name], identified by name and date of birth, is a 42 y.o. female with Type 2 diabetes  .  Other people present during visit: 2 sons - Mohammed and Karie Mainland and an interpreter      ASSESSMENT  Patient Concerns:     Weight 184 lb 14.4 oz (83.87 kg), last menstrual period 11/24/2012. Body mass index is 29.86 kg/(m^2).  Lab Results: LDL Cholesterol  Date Value Range Status  07/07/2012 92  0 - 99 mg/dL Final           Hemoglobin A1C  Date Value Range Status  10/29/2012 12.1   Final  01/31/2010 10.3   Final     No family history on file. History  Substance Use Topics  . Smoking status: Never Smoker   . Smokeless tobacco: Never Used  . Alcohol Use: No    Support Systems:   family Special Needs:    unsure if patient allowed to make decision in her culture/family/son speaks for her Prior DM Education:  yes Daily Foot Exams:  yes Patient Belief / Attitude about Diabetes:    she is okay if blood sugars not usre  Assessment comments: patient asked for meal time insulin, but eldest son felt she should do better with her diet instead. Patient was discussed with Dr. Yetta Barre  Diet Recall: verbalizes comprehension of what and how much she should be eating and says she finds it difficult to follow.    Individualized Plan for Diabetes Self-Management Training: Patient individualized diabetes plan discussed today with patient and includes:  Nutrition, medications, monitoring,  how to handle highs and lows  Education Topics Reviewed with Patient Today:  Topic Points Discussed  Disease State    Nutrition Management    Physical Activity and Exercise    Medications Reviewed patients medication for diabetes, action, purpose, timing of dose and side effects.  Monitoring    Acute Complications Taught treatment of hypoglycemia - the 15 rule.  Chronic Complications    Psychosocial Adjustment    Goal Setting     Preconception Care (if applicable)      PATIENTS GOALS   Learning Objective(s):     Goal The patient agrees to:  Nutrition    Physical Activity    Medications    Monitoring    Problem Solving    Reducing Risk    Health Coping     Patient Self-Evaluation of Goals (Subsequent Visits)  Goal The patient rates self as meeting goals (% of time)  Nutrition 25 - 50%  Physical Activity    Medications >75%  Monitoring    Problem Solving    Reducing Risk    Health Coping       PERSONALIZED PLAN / SUPPORT  Self-Management Support:     Family, doctors, CDE ______________________________________________________________________  Outcomes  Expected Outcomes:  Demonstrated interest in learning. Expect positive outcomes Self-Care Barriers:  English as a second language;Lack of material resources;Other (comment) Education material provided: yes If problems or questions, patient to contact team via:  Phone Time in: 1500     Time out: 1545 Future DSME appointment: - 2 months   Plyler, Lupita Leash 12/03/2012 5:26 PM

## 2012-12-03 NOTE — Progress Notes (Signed)
[name], female   DOB: [birth year], 42 y.o.   MRN: [id number]   Subjective:   [name] female   DOB: 15-Jun-1970 42 y.o.   MRN: [id number]  HPI: [name] is a 42 y.o. Arabic-speaking female with past medical history of type 2 diabetes mellitus and hyperlipidemia who presents for  Diabetes follow up. She has had many problems with her DM control in the past and has been on several different regimens for her control. Right now, she is on Metformin 1000 mg bid and Lantus 50 units in the AM. Her last HbA1c was 10/29/12 which was 12.1. She is not interested in starting mealtime insulin as she has had issues w/ compliance in the past. She saw an eye doctor 4 months ago and according to the patient, she had some diabetic changes which have started to affect her vision. She has another appointment later this month.  Her blood glucose in the past month is an average of 279 w/ her lowest being 164, and her highest being 469. She feels that her blood sugar is best for her when it is in the 180s-220's. She denies very few hypoglycemic episodes, but says she had symptoms of dizziness and lightheadedness when her blood sugar was 92.  She denies any other issues at this time. She denies nausea, vomiting, fever, chills, SOB, chest pain, dairrhea, dysuria, polyuria, or polydipsia, despite her high sugars.  Past Medical History  Diagnosis Date  . Routine/ritual circumcision   . Anemia, iron deficiency   . Glaucoma associated with ocular inflammation   . Uveitis   . Decreased visual acuity     Left eye  . Hair loss   . Hyperlipidemia   . Pap smear abnormality of cervix with LGSIL   . Type II diabetes mellitus    Current Outpatient Prescriptions  Medication Sig Dispense Refill  . acetaminophen (TYLENOL) 325 MG tablet Take 650 mg by mouth every 6 (six) hours as needed.      Marland Kitchen amoxicillin-clavulanate (AUGMENTIN) 875-125 MG per tablet TAKE ONE TABLET BY MOUTH TWICE DAILY  10  tablet  0  . atorvastatin (LIPITOR) 20 MG tablet Take 1 tablet (20 mg total) by mouth daily.  30 tablet  11  . atropine 1 % ophthalmic solution Place 1 drop into both eyes 2 (two) times daily. Prescribed by ophthalmologist at [facility]  2 mL  11  . brimonidine-timolol (COMBIGAN) 0.2-0.5 % ophthalmic solution Place 1 drop into both eyes 2 (two) times daily. To affected eye  10 mL  11  . Ferrous Fumarate-Folic Acid (HEMOCYTE-F) 324-1 MG TABS Take 1 tablet by mouth daily.  30 each  1  . glucose blood (ACCU-CHEK SMARTVIEW) test strip Check blood sugar 4 times daily  Before meal and bedtime dx code 250.52  Insulin requiring  150 each  12  . guaiFENesin-codeine (ROBITUSSIN AC) 100-10 MG/5ML syrup Take 5 mLs by mouth 3 (three) times daily as needed for cough.  120 mL  0  . insulin glargine (LANTUS) 100 UNIT/ML injection Inject 50 Units into the skin at bedtime.  10 mL  11  . Insulin Pen Needle (SURE COMFORT PEN NEEDLES) 31G X 8 MM MISC Inject 1 Container into the skin 2 (two) times daily. Use with insulin pen as directed  100 each  11  . Insulin Syringe-Needle U-100 30G X 5/16" 1 ML MISC Use it for injecting insulin once daily  100 each  11  . Lancets (ACCU-CHEK  SOFT TOUCH) lancets Check your blood glucose 2 times a day before meals  100 each  11  . metFORMIN (GLUCOPHAGE) 500 MG tablet Take 2 tablets (1,000 mg total) by mouth 2 (two) times daily with a meal.  120 tablet  5  . prednisoLONE acetate (PRED FORTE) 1 % ophthalmic suspension Place 1 drop into both eyes daily.  10 mL  11  . RELION INSULIN SYR 0.5ML/31G 31G X 5/16" 0.5 ML MISC INJECT 1 DEVICE INTO THE SKIN AT BETIME. USE TO INJECT INSULIN ONCE DAILY  100 each  10   No current facility-administered medications for this visit.   No family history on file. History   Social History  . Marital Status: Married    Spouse Name: N/A    Number of Children: N/A  . Years of Education: N/A   Social History Main Topics  . Smoking status: Never Smoker    . Smokeless tobacco: Never Used  . Alcohol Use: No  . Drug Use: No  . Sexual Activity: Not Currently   Other Topics Concern  . None   Social History Narrative   Pt. plans to sue [employer] for testing supplies [phone] Mitchell Heir July 28,2010 2:10PM      Pt is [place] Arabic.    Review of Systems: General: Denies fever, chills, diaphoresis, appetite change and fatigue.  HEENT: Positive for change in vision. Denies eye pain, redness, hearing loss, congestion, sore throat, rhinorrhea, sneezing, mouth sores, trouble swallowing, neck pain, neck stiffness and tinnitus.   Respiratory: Denies SOB, DOE, cough, chest tightness, and wheezing.   Cardiovascular: Denies chest pain, palpitations and leg swelling.  Gastrointestinal: Denies nausea, vomiting, abdominal pain, diarrhea, constipation, blood in stool and abdominal distention.  Genitourinary: Denies dysuria, urgency, frequency, hematuria, flank pain and difficulty urinating.  Endocrine: Denies hot or cold intolerance, sweats, polyuria, polydipsia. Musculoskeletal: Denies myalgias, back pain, joint swelling, arthralgias and gait problem.  Skin: Denies pallor, rash and wounds.  Neurological: Denies dizziness, seizures, syncope, weakness, lightheadedness, numbness and headaches.  Hematological: Denies adenopathy,easy bruising, personal or family bleeding history.  Psychiatric/Behavioral: Denies mood changes, confusion, nervousness, sleep disturbance and agitation.  Objective:  Physical Exam: Filed Vitals:   12/03/12 1613  BP: 111/80  Pulse: 94  Temp: 97 F (36.1 C)  TempSrc: Oral  Height: 5' 4.5" (1.638 m)  Weight: 184 lb 14.4 oz (83.87 kg)  SpO2: 98%   Filed Vitals:   12/03/12 1613  BP: 111/80  Pulse: 94  Temp: 97 F (36.1 C)  TempSrc: Oral  Height: 5' 4.5" (1.638 m)  Weight: 184 lb 14.4 oz (83.87 kg)  SpO2: 98%   General: Vital signs reviewed.  Patient is a well-developed and well-nourished, in no acute  distress and cooperative with exam. Alert and oriented x3.  Head: Normocephalic and atraumatic. Nose: No erythema or drainage noted.  Turbinates normal. Mouth: No erythema, exudates, sores, or ulcerations. Moist mucus membranes. Eyes: PERRL, EOMI, conjunctivae normal, No scleral icterus.  Neck: Supple, trachea midline, normal ROM, No JVD, masses, thyromegaly, or carotid bruit present.  Cardiovascular: RRR, S1 normal, S2 normal, no murmurs, gallops, or rubs. Pulmonary/Chest: normal respiratory effort, CTAB, no wheezes, rales, or rhonchi. Abdominal: Soft. Non-tender, non-distended, bowel sounds are normal, no masses, organomegaly, or guarding present.  Musculoskeletal: No joint deformities, erythema, or stiffness, ROM full and no nontender. Extremities: No swelling or edema,  pulses symmetric and intact bilaterally. No cyanosis or clubbing. Hematology: no cervical, inginal, or axillary adenopathy.  Neurological: A&O x3,  Strength is normal and symmetric bilaterally, cranial nerve II-XII are grossly intact, no focal motor deficit, sensory intact to light touch bilaterally.  Skin: Warm, dry and intact. No rashes or erythema. Psychiatric: Normal mood and affect. speech and behavior is normal. Cognition and memory are normal.   Assessment & Plan:   Please see problem based assessment and plan.

## 2012-12-03 NOTE — Assessment & Plan Note (Signed)
Last LDL 92, well controlled on 20 mg Lipitor.

## 2012-12-04 NOTE — Progress Notes (Signed)
I saw and evaluated the patient.  I personally confirmed the key portions of the history and exam documented by Dr. Jones and I reviewed pertinent patient test results.  The assessment, diagnosis, and plan were formulated together and I agree with the documentation in the resident's note.   

## 2012-12-31 ENCOUNTER — Encounter: Payer: Self-pay | Admitting: Internal Medicine

## 2012-12-31 ENCOUNTER — Ambulatory Visit (INDEPENDENT_AMBULATORY_CARE_PROVIDER_SITE_OTHER): Payer: Medicaid Other | Admitting: Internal Medicine

## 2012-12-31 VITALS — BP 124/79 | HR 86 | Temp 97.4°F | Ht 64.5 in | Wt 185.4 lb

## 2012-12-31 DIAGNOSIS — E119 Type 2 diabetes mellitus without complications: Secondary | ICD-10-CM

## 2012-12-31 DIAGNOSIS — E1139 Type 2 diabetes mellitus with other diabetic ophthalmic complication: Secondary | ICD-10-CM

## 2012-12-31 LAB — GLUCOSE, CAPILLARY: Glucose-Capillary: 300 mg/dL — ABNORMAL HIGH (ref 70–99)

## 2012-12-31 MED ORDER — "INSULIN SYRINGE-NEEDLE U-100 31G X 5/16"" 1 ML MISC": 1.0000 | Freq: Every day | Status: DC

## 2012-12-31 MED ORDER — ACCU-CHEK SOFT TOUCH LANCETS MISC: Status: DC

## 2012-12-31 MED ORDER — METFORMIN HCL 500 MG PO TABS: 1000.0000 mg | ORAL_TABLET | Freq: Two times a day (BID) | ORAL | Status: DC

## 2012-12-31 MED ORDER — INSULIN GLARGINE 100 UNIT/ML ~~LOC~~ SOLN: 55.0000 [IU] | Freq: Every day | SUBCUTANEOUS | Status: DC

## 2012-12-31 MED ORDER — GLUCOSE BLOOD VI STRP: ORAL_STRIP | Status: DC

## 2012-12-31 NOTE — Assessment & Plan Note (Addendum)
Patient was instructed to start Lantus 55 units at last visit and follow up with [facility] in 2 weeks to discuss new regimen and make adjustments as necessary. However, she continued to take Lantus 50 units because she said she "forgot" to change her dose. Her glucometer readings in the past month show an average of 293 w/ her lowest being 126, and her highest being 491. Virtually unchanged from her previous visit.  -Instructed patient to start 55 units Lantus and follow up with [facility] in 2 weeks to discuss glucometer readings at that time. If sugars are still significantly high, we will increase her Lantus dose to 60 units.  -She was also given new syringes that are 1ml, instead of 0.36ml, in order to give larger dose of Lantus. -Refilled all necessary medications; Lantus, Metformin, lancets, needles, test strips. -Will return in 1 month to assess DM control at that time. -Will also obtain records from ophthalmologist in regards to her visit yesterday.

## 2012-12-31 NOTE — Patient Instructions (Signed)
1. Please schedule follow up appointment with Norm Parcel in 2 weeks to discuss your new Lantus dose. Please bring your glucometer readings at that time. Please schedule a follow up appointment with me, Dr. Yetta Barre, in 4 weeks. 2. Please take all medications as prescribed. Please take Lantus 55 units daily.  3. If you have worsening of your symptoms or new symptoms arise, please call the clinic ([phone]), or go to the ER immediately if symptoms are severe.

## 2012-12-31 NOTE — Progress Notes (Signed)
[name], female   DOB: [birth year], 42 y.o.   MRN: [id number]   Subjective:   [name] female   DOB: 1970-12-03 42 y.o.   MRN: [id number]  HPI: [name] is a 42 y.o. Arabic-speaking female with past medical history of type 2 diabetes mellitus and hyperlipidemia who presents for diabetes follow up. She has had many problems with her DM control in the past and has been on several different regimens for her control. Right now, she is on Metformin 1000 mg bid and Lantus 50 units in the AM. Her last HbA1c was 10/29/12 which was 12.1. She is not interested in starting mealtime insulin as she has had issues w/ compliance in the past. At her last visit, she was instructed to start Lantus 55 units instead of 50, but she said she forgot to change her dose because she was too busy. She has been having worsening vision and has followed with the ophthalmologist, last visit was yesterday, 12/30/12. She did not bring the note to her clinic appointment. She reports that her vision has been more blurry than usual.   Her blood glucose in the past month is an average of 293 w/ her lowest being 126, and her highest being 491. She feels that her blood sugar is best for her when it is in the 180s-220's. She reports that she has not been regulating what she eats, even after long discussions about a diet regimen at her last visit.   She denies any other issues at this time. She denies nausea, vomiting, fever, chills, SOB, chest pain, dairrhea, dysuria, polyuria, or polydipsia, despite her high sugars.  Past Medical History  Diagnosis Date  . Routine/ritual circumcision   . Anemia, iron deficiency   . Glaucoma associated with ocular inflammation   . Uveitis   . Decreased visual acuity     Left eye  . Hair loss   . Hyperlipidemia   . Pap smear abnormality of cervix with LGSIL   . Type II diabetes mellitus    Current Outpatient Prescriptions  Medication Sig Dispense Refill  .  acetaminophen (TYLENOL) 325 MG tablet Take 650 mg by mouth every 6 (six) hours as needed.      Marland Kitchen amoxicillin-clavulanate (AUGMENTIN) 875-125 MG per tablet TAKE ONE TABLET BY MOUTH TWICE DAILY  10 tablet  0  . atorvastatin (LIPITOR) 20 MG tablet Take 1 tablet (20 mg total) by mouth daily.  30 tablet  11  . atropine 1 % ophthalmic solution Place 1 drop into both eyes 2 (two) times daily. Prescribed by ophthalmologist at [facility]  2 mL  11  . brimonidine-timolol (COMBIGAN) 0.2-0.5 % ophthalmic solution Place 1 drop into both eyes 2 (two) times daily. To affected eye  10 mL  11  . Ferrous Fumarate-Folic Acid (HEMOCYTE-F) 324-1 MG TABS Take 1 tablet by mouth daily.  30 each  1  . glucose blood (ACCU-CHEK SMARTVIEW) test strip Check blood sugar 4 times daily  Before meal and bedtime dx code 250.52  Insulin requiring  150 each  12  . guaiFENesin-codeine (ROBITUSSIN AC) 100-10 MG/5ML syrup Take 5 mLs by mouth 3 (three) times daily as needed for cough.  120 mL  0  . insulin glargine (LANTUS) 100 UNIT/ML injection Inject 0.55 mL (55 Units total) into the skin at bedtime.  10 mL  11  . Insulin Pen Needle (SURE COMFORT PEN NEEDLES) 31G X 8 MM MISC Inject 1 Container into the skin 2 (  two) times daily. Use with insulin pen as directed  100 each  11  . Insulin Syringe-Needle U-100 30G X 5/16" 1 ML MISC Use it for injecting insulin once daily  100 each  11  . Lancets (ACCU-CHEK SOFT TOUCH) lancets Check your blood glucose 2 times a day before meals  100 each  11  . metFORMIN (GLUCOPHAGE) 500 MG tablet Take 2 tablets (1,000 mg total) by mouth 2 (two) times daily with a meal.  120 tablet  5  . prednisoLONE acetate (PRED FORTE) 1 % ophthalmic suspension Place 1 drop into both eyes daily.  10 mL  11  . RELION INSULIN SYR 0.5ML/31G 31G X 5/16" 0.5 ML MISC INJECT 1 DEVICE INTO THE SKIN AT BETIME. USE TO INJECT INSULIN ONCE DAILY  100 each  10   No current facility-administered medications for this visit.   No family  history on file. History   Social History  . Marital Status: Married    Spouse Name: N/A    Number of Children: N/A  . Years of Education: N/A   Social History Main Topics  . Smoking status: Never Smoker   . Smokeless tobacco: Never Used  . Alcohol Use: No  . Drug Use: No  . Sexual Activity: Not Currently   Other Topics Concern  . None   Social History Narrative   Pt. plans to sue [employer] for testing supplies [phone] Mitchell Heir July 28,2010 2:10PM      Pt is [place] Arabic.    Review of Systems: General: Denies fever, chills, diaphoresis, appetite change and fatigue.  HEENT: Positive for change in vision. Denies eye pain, redness, hearing loss, congestion, sore throat, rhinorrhea, sneezing, mouth sores, trouble swallowing, neck pain, neck stiffness and tinnitus.   Respiratory: Denies SOB, DOE, cough, chest tightness, and wheezing.   Cardiovascular: Denies chest pain, palpitations and leg swelling.  Gastrointestinal: Denies nausea, vomiting, abdominal pain, diarrhea, constipation, blood in stool and abdominal distention.  Genitourinary: Denies dysuria, urgency, frequency, hematuria, flank pain and difficulty urinating.  Endocrine: Denies hot or cold intolerance, sweats, polyuria, polydipsia. Musculoskeletal: Denies myalgias, back pain, joint swelling, arthralgias and gait problem.  Skin: Denies pallor, rash and wounds.  Neurological: Denies dizziness, seizures, syncope, weakness, lightheadedness, numbness and headaches.  Psychiatric/Behavioral: Denies mood changes, confusion, nervousness, sleep disturbance and agitation.  Objective:  Physical Exam: Filed Vitals:   12/31/12 1053  BP: 124/79  Pulse: 86  Temp: 97.4 F (36.3 C)  TempSrc: Oral  Height: 5' 4.5" (1.638 m)  Weight: 185 lb 6.4 oz (84.097 kg)  SpO2: 99%   General: Vital signs reviewed.  Patient is a well-developed and well-nourished, in no acute distress and cooperative with exam. Alert and oriented  x3.  Head: Normocephalic and atraumatic. Eyes: PERRL, EOMI, conjunctivae normal, No scleral icterus. Haziness noted of the cornea/lens bilaterally. Neck: Supple, trachea midline, normal ROM, No JVD, masses, thyromegaly, or carotid bruit present.  Cardiovascular: RRR, S1 normal, S2 normal, no murmurs, gallops, or rubs. Pulmonary/Chest: Normal respiratory effort, CTAB, no wheezes, rales, or rhonchi. Abdominal: Soft. Non-tender, non-distended, bowel sounds are normal, no masses, organomegaly, or guarding present.  Musculoskeletal: No joint deformities, erythema, or stiffness, ROM full and no nontender. Extremities: No swelling or edema,  pulses symmetric and intact bilaterally. No cyanosis or clubbing. Neurological: A&O x3, Strength is normal and symmetric bilaterally, cranial nerve II-XII are grossly intact, no focal motor deficit, sensory intact to light touch bilaterally.  Skin: Warm, dry and intact. No rashes or  erythema. Psychiatric: Normal mood and affect. speech and behavior is normal. Cognition and memory are normal.   Assessment & Plan:   Please see problem based assessment and plan.

## 2013-01-04 NOTE — Progress Notes (Signed)
I saw and evaluated the patient.  I personally confirmed the key portions of the history and exam documented by Dr. Yetta Barre and I reviewed pertinent patient test results.  The assessment, diagnosis, and plan were formulated together and I agree with the documentation in the resident's note. MS Rogala understands the risk and complications of uncontrolled DM. She has had recent deaths in the family and feels that this is contributing to her poor control

## 2013-01-14 ENCOUNTER — Ambulatory Visit (INDEPENDENT_AMBULATORY_CARE_PROVIDER_SITE_OTHER): Payer: Medicaid Other | Admitting: Dietician

## 2013-01-14 VITALS — Wt 190.4 lb

## 2013-01-14 DIAGNOSIS — E1139 Type 2 diabetes mellitus with other diabetic ophthalmic complication: Secondary | ICD-10-CM

## 2013-01-14 NOTE — Patient Instructions (Signed)
You can get your flu vaccine and foot exam at your doctor visit in October.  I will call for your eye exam.

## 2013-01-14 NOTE — Progress Notes (Signed)
Diabetes Self-Management Education  Visit Number: Follow-up 4  01/14/2013 Ms. Cathie Olden, identified by name and date of birth, is a 42 y.o. female with  .  Other people present during visit- her son Shirline Frees and interpreter      ASSESSMENT  Patient Concerns:     Weight 190 lb 6.4 oz (86.365 kg). Body mass index is 32.19 kg/(m^2).  Lab Results: LDL Cholesterol  Date Value Range Status  07/07/2012 92  0 - 99 mg/dL Final           Hemoglobin A1C  Date Value Range Status  10/29/2012 12.1   Final  01/31/2010 10.3   Final     No family history on file. History  Substance Use Topics  . Smoking status: Never Smoker   . Smokeless tobacco: Never Used  . Alcohol Use: No        Assessment comments: patient reports experiencing weakness at blood sugars below 150 mg/dl. She'd prefer a lower dose of insulin. She stopped taking her metformin 4-5 days ago due to blood sugars she felt were too low.   Meter download shows continuing patterns of increasing hyperglycemia during the course of the day indicating insufficient prandial coverage. The average of 40 test in the past 30 days is 252 with a range of 87 this am when she felt shaky, to 491 with an average of 1.3 test/day.  Could consider once a day NPH qam to try to cover mealtime highs in PM and less hypoglycemia. If she injects this in a slower releasing part of her body ( buttocks, arms or legs) it may be adequate to cover her overnight blood sugars as it seems she is not very insulin resistant.    Diet Recall: chicken,  [place], fish, vegetables and juice when feeling weak per patient    Individualized Plan for Diabetes Self-Management Training:  Patient individualized diabetes plan discussed today with patient and includes:, Nutrition, medications, monitoring,  how to handle highs and lows, Problem solving, healthy coping, reducing risk   Education Topics Reviewed with Patient Today:  Topic Points Discussed  Disease State     Nutrition Management    Physical Activity and Exercise    Medications Reviewed patients medication for diabetes, action, purpose, timing of dose and side effects.  Monitoring    Acute Complications Taught treatment of hypoglycemia - the 15 rule.  Chronic Complications    Psychosocial Adjustment    Goal Setting    Preconception Care (if applicable)      PATIENTS GOALS   Learning Objective(s):     Goal The patient agrees to:  Nutrition    Physical Activity    Medications take my medication as prescribed  Monitoring    Problem Solving    Reducing Risk    Health Coping     Patient Self-Evaluation of Goals (Subsequent Visits)  Goal The patient rates self as meeting goals (% of time)  Nutrition 50 - 75 %  Physical Activity    Medications 25 - 50%  Monitoring    Problem Solving    Reducing Risk    Health Coping       PERSONALIZED PLAN / SUPPORT  Self-Management Support:    Family, friends, doctor appointments and CDE  ______________________________________________________________________  Outcomes  Expected Outcomes:  Demonstrated interest in learning. Expect positive outcomes Self-Care Barriers:  English as a second language;Lack of material resources Education material provided: yes- Dentist If problems or questions, patient to contact team via:  Phone  Time in: 1100     Time out: 1130 Future DSME appointment: - 3-4 months   Plyler, Lupita Leash 01/14/2013 11:30 AM

## 2013-01-27 ENCOUNTER — Encounter (HOSPITAL_COMMUNITY): Payer: Self-pay | Admitting: Emergency Medicine

## 2013-01-27 ENCOUNTER — Emergency Department (HOSPITAL_COMMUNITY)
Admission: EM | Admit: 2013-01-27 | Discharge: 2013-01-27 | Disposition: A | Discharge status: Home / Self Care | Payer: Medicaid Other | Attending: Emergency Medicine | Admitting: Emergency Medicine

## 2013-01-27 ENCOUNTER — Emergency Department (HOSPITAL_COMMUNITY): Payer: Medicaid Other

## 2013-01-27 DIAGNOSIS — Y929 Unspecified place or not applicable: Secondary | ICD-10-CM | POA: Insufficient documentation

## 2013-01-27 DIAGNOSIS — S93409A Sprain of unspecified ligament of unspecified ankle, initial encounter: Secondary | ICD-10-CM | POA: Insufficient documentation

## 2013-01-27 DIAGNOSIS — Y939 Activity, unspecified: Secondary | ICD-10-CM | POA: Insufficient documentation

## 2013-01-27 DIAGNOSIS — S93401A Sprain of unspecified ligament of right ankle, initial encounter: Secondary | ICD-10-CM

## 2013-01-27 DIAGNOSIS — S92911A Unspecified fracture of right toe(s), initial encounter for closed fracture: Secondary | ICD-10-CM

## 2013-01-27 DIAGNOSIS — S92919A Unspecified fracture of unspecified toe(s), initial encounter for closed fracture: Secondary | ICD-10-CM | POA: Insufficient documentation

## 2013-01-27 DIAGNOSIS — W010XXA Fall on same level from slipping, tripping and stumbling without subsequent striking against object, initial encounter: Secondary | ICD-10-CM | POA: Insufficient documentation

## 2013-01-27 DIAGNOSIS — X500XXA Overexertion from strenuous movement or load, initial encounter: Secondary | ICD-10-CM | POA: Insufficient documentation

## 2013-01-27 DIAGNOSIS — E119 Type 2 diabetes mellitus without complications: Secondary | ICD-10-CM | POA: Insufficient documentation

## 2013-01-27 DIAGNOSIS — Z8669 Personal history of other diseases of the nervous system and sense organs: Secondary | ICD-10-CM | POA: Insufficient documentation

## 2013-01-27 HISTORY — DX: Blindness, one eye, unspecified eye: H54.40

## 2013-01-27 MED ORDER — NAPROXEN 500 MG PO TABS: 500.0000 mg | ORAL_TABLET | Freq: Two times a day (BID) | ORAL | Status: DC

## 2013-01-27 NOTE — ED Notes (Signed)
Presents post tripping and falling and rolling right ankle. Right ankle swelling, no deformity, CMS intact. Denies Syncope.

## 2013-01-27 NOTE — ED Notes (Signed)
Pain and swelling right foot and ankle.

## 2013-01-27 NOTE — ED Notes (Signed)
Called Ortho tech  

## 2013-01-27 NOTE — ED Provider Notes (Signed)
CSN: [id number]     Arrival date & time 01/27/13  1717 History   First MD Initiated Contact with Patient 01/27/13 1811     Chief Complaint  Patient presents with  . Ankle Injury   (Consider location/radiation/quality/duration/timing/severity/associated sxs/prior Treatment) HPI Comments: Patient presenting with a chief complaint of right ankle pain.  Pain has been present since this morning.  She reports that she tripped earlier this morning and twisted her right ankle.  She fell to the ground, but did not hit her head.  She has been able to bear weight since the injury, but has had increased pain of the right ankle with ambulation.  She also reports some pain of the right 5th toe.  Denies pain anywhere else.  She states that she has been taking Tylenol for the pain with mild improvement.  She denies any prior injury of the ankle.    The history is provided by the patient.    Past Medical History  Diagnosis Date  . Diabetes mellitus without complication   . Hypercholesteremia   . Blindness of left eye    History reviewed. No pertinent past surgical history. History reviewed. No pertinent family history. History  Substance Use Topics  . Smoking status: Never Smoker   . Smokeless tobacco: Not on file  . Alcohol Use: No   OB History   Grav Para Term Preterm Abortions TAB SAB Ect Mult Living                 Review of Systems  Musculoskeletal:       Right ankle pain    Allergies  Review of patient's allergies indicates no known allergies.  Home Medications  No current outpatient prescriptions on file. BP 136/105  Pulse 93  Temp(Src) 99 F (37.2 C) (Oral)  Resp 16  Wt 188 lb 8 oz (85.503 kg)  SpO2 97%  LMP 01/25/2013 Physical Exam  Nursing note and vitals reviewed. Constitutional: She appears well-developed and well-nourished.  HENT:  Head: Normocephalic and atraumatic.  Mouth/Throat: Oropharynx is clear and moist.  Neck: Normal range of motion. Neck supple.   Cardiovascular: Normal rate, regular rhythm and normal heart sounds.   Pulses:      Dorsalis pedis pulses are 2+ on the right side, and 2+ on the left side.  Pulmonary/Chest: Effort normal and breath sounds normal.  Musculoskeletal:       Right ankle: She exhibits swelling. Tenderness. Lateral malleolus tenderness found.  Swelling and tenderness of the right lateral malleolus.  Neurological: She is alert. No sensory deficit.  Skin: Skin is warm and dry.  Psychiatric: She has a normal mood and affect.    ED Course  Procedures (including critical care time) Labs Review Labs Reviewed - No data to display Imaging Review Dg Ankle Complete Right  01/27/2013   CLINICAL DATA:  History of fall complaining right foot and ankle pain.  EXAM: RIGHT ANKLE - COMPLETE 3+ VIEW  COMPARISON:  No priors. Axial three views of the right ankle demonstrate soft tissue swelling overlying the lateral malleolus. No acute displaced fracture, subluxation or dislocation is identified.  FINDINGS: 1. No acute bony abnormality in the right ankle. 2. Extensive soft tissue swelling overlying the lateral malleolus.  IMPRESSION: Negative.   Electronically Signed   By: Trudie Reed M.D.   On: 01/27/2013 18:28   Dg Foot Complete Right  01/27/2013   CLINICAL DATA:  Fall, right foot and ankle pain and swelling  EXAM: RIGHT FOOT COMPLETE -  3+ VIEW  COMPARISON:  Concurrently obtained radiographs of the ankle  FINDINGS: Acute nondisplaced fracture through the base of the proximal phalanx of the little toe. Bold the remainder of the visualized bones and joints are unremarkable. No ankle joint effusion. Normal osseous mineralization.  IMPRESSION: Acute nondisplaced horizontally oriented fracture through the base of the proximal phalanx of the little toe.   Electronically Signed   By: Malachy Moan M.D.   On: 01/27/2013 18:19    MDM  No diagnosis found. Patient presenting with right ankle pain after twisting her ankle this  morning.  Negative ankle xray.  She does have a closed nondisplaced fracture of the right 5th toe.  Patient neurovascularly intact.  Toe buddy taped.  Patient given a ankle ASO and crutches.  Patient given follow up with Orthopedics.  Patient stable for discharge.      Pascal Lux [place], PA-C 01/27/13 [phone]

## 2013-01-27 NOTE — Progress Notes (Signed)
Orthopedic Tech Progress Note Patient Details:  Jennifer [name] 04/23/1974 [id number]  Ortho Devices Type of Ortho Device: ASO;Crutches;Buddy tape Ortho Device/Splint Location: RLE Ortho Device/Splint Interventions: Application;Ordered   [name] 01/27/2013, 6:58 PM

## 2013-01-28 ENCOUNTER — Encounter: Payer: Self-pay | Admitting: Internal Medicine

## 2013-01-28 ENCOUNTER — Ambulatory Visit (INDEPENDENT_AMBULATORY_CARE_PROVIDER_SITE_OTHER): Payer: Medicaid Other | Admitting: Internal Medicine

## 2013-01-28 VITALS — BP 132/82 | HR 88 | Temp 97.5°F | Ht 66.0 in | Wt 191.1 lb

## 2013-01-28 DIAGNOSIS — S8290XD Unspecified fracture of unspecified lower leg, subsequent encounter for closed fracture with routine healing: Secondary | ICD-10-CM

## 2013-01-28 DIAGNOSIS — Z0000 Encounter for general adult medical examination without abnormal findings: Secondary | ICD-10-CM

## 2013-01-28 DIAGNOSIS — Z23 Encounter for immunization: Secondary | ICD-10-CM

## 2013-01-28 DIAGNOSIS — S92901D Unspecified fracture of right foot, subsequent encounter for fracture with routine healing: Secondary | ICD-10-CM

## 2013-01-28 DIAGNOSIS — E1139 Type 2 diabetes mellitus with other diabetic ophthalmic complication: Secondary | ICD-10-CM

## 2013-01-28 DIAGNOSIS — S92901A Unspecified fracture of right foot, initial encounter for closed fracture: Secondary | ICD-10-CM | POA: Insufficient documentation

## 2013-01-28 MED ORDER — INSULIN NPH (HUMAN) (ISOPHANE) 100 UNIT/ML ~~LOC~~ SUSP: SUBCUTANEOUS | Status: DC

## 2013-01-28 NOTE — Assessment & Plan Note (Signed)
Flu shot given today

## 2013-01-28 NOTE — Assessment & Plan Note (Signed)
Lab Results  Component Value Date   HGBA1C 9.8 01/28/2013   HGBA1C 12.1 10/29/2012   HGBA1C 11.9 07/07/2012     Assessment: Diabetes control: poor control (HgbA1C >9%) Progress toward A1C goal:  improved Comments: Given morning hypoglycemia, I believe the patient will be most benefited from changing from Lantus to NPH insulin, as suggested by [employer]. Since the patient has discontinued metformin, we will be this medication off.  Plan: Medications:  Discontinue metformin and Lantus 55.  Start Relion N (insulin NPH), 40 units qam, 20 units qpm. Home glucose monitoring: Frequency: once a day Timing: before meals Instruction/counseling given: reminded to bring blood glucose meter & log to each visit Educational resources provided:   Self management tools provided:   Other plans: Follow-up in 4-6 weeks.

## 2013-01-28 NOTE — Progress Notes (Signed)
HPI The patient is a 42 y.o. female with a history of diabetes, glaucoma, presenting for a followup visit.  The patient has a history of diabetes, with last A1c = 12.1 (10/29/12).  At the patient's last visit one month ago, Lantus was increased to 55 units. However, 2 weeks ago, the patient noted episodes of hypoglycemia, so she discontinued her metformin, which resolved the hypoglycemia. Hypoglycemic episodes tend to occur in the early morning.  A1C today is 9.8.  Glucometer report reveals a range from 127-363, average 241, with lows occurring in the morning.  The patient notes right ankle pain, status post a right fifth toe fracture, treated in the ED yesterday. The pain is controlled with naproxen. The patient is in the process of making an appointment with orthopedics..  ROS: General: no fevers, chills, changes in weight, changes in appetite Skin: no rash HEENT: no blurry vision, hearing changes, sore throat Pulm: no dyspnea, coughing, wheezing CV: no chest pain, palpitations, shortness of breath Abd: no abdominal pain, nausea/vomiting, diarrhea/constipation GU: no dysuria, hematuria, polyuria Ext: no arthralgias, myalgias Neuro: no weakness, numbness, or tingling  Filed Vitals:   01/28/13 1031  BP: 132/82  Pulse: 88  Temp: 97.5 F (36.4 C)    PEX General: alert, cooperative, and in no apparent distress HEENT: pupils equal round and reactive to light, vision grossly intact, oropharynx clear and non-erythematous  Neck: supple Lungs: clear to ascultation bilaterally, normal work of respiration, no wheezes, rales, ronchi Heart: regular rate and rhythm, no murmurs, gallops, or rubs Abdomen: soft, non-tender, non-distended, normal bowel sounds Extremities: no cyanosis, clubbing, or edema Neurologic: alert & oriented X3, cranial nerves II-XII intact, strength grossly intact, sensation intact to light touch  Current Outpatient Prescriptions on File Prior to Visit  Medication Sig  Dispense Refill  . acetaminophen (TYLENOL) 325 MG tablet Take 650 mg by mouth every 6 (six) hours as needed.      Marland Kitchen amoxicillin-clavulanate (AUGMENTIN) 875-125 MG per tablet TAKE ONE TABLET BY MOUTH TWICE DAILY  10 tablet  0  . atorvastatin (LIPITOR) 20 MG tablet Take 1 tablet (20 mg total) by mouth daily.  30 tablet  11  . atropine 1 % ophthalmic solution Place 1 drop into both eyes 2 (two) times daily. Prescribed by ophthalmologist at [facility]  2 mL  11  . brimonidine-timolol (COMBIGAN) 0.2-0.5 % ophthalmic solution Place 1 drop into both eyes 2 (two) times daily. To affected eye  10 mL  11  . dorzolamide-timolol (COSOPT) 22.3-6.8 MG/ML ophthalmic solution Place 1 drop into the left eye 2 (two) times daily.      . Ferrous Fumarate-Folic Acid (HEMOCYTE-F) 324-1 MG TABS Take 1 tablet by mouth daily.  30 each  1  . glucose 4 GM chewable tablet Chew 16 g by mouth daily as needed for low blood sugar.      Marland Kitchen glucose blood (ACCU-CHEK SMARTVIEW) test strip Check blood sugar 4 times daily  Before meal and bedtime dx code 250.52  Insulin requiring  150 each  12  . guaiFENesin-codeine (ROBITUSSIN AC) 100-10 MG/5ML syrup Take 5 mLs by mouth 3 (three) times daily as needed for cough.  120 mL  0  . insulin glargine (LANTUS) 100 UNIT/ML injection Inject 0.55 mLs (55 Units total) into the skin at bedtime.  10 mL  11  . insulin glargine (LANTUS) 100 UNIT/ML injection Inject 55 Units into the skin at bedtime.      . Insulin Pen Needle (SURE COMFORT PEN NEEDLES)  31G X 8 MM MISC Inject 1 Container into the skin 2 (two) times daily. Use with insulin pen as directed  100 each  11  . Insulin Syringe-Needle U-100 (RELION INSULIN SYRINGE 1ML/31G) 31G X 5/16" 1 ML MISC 1 each by Does not apply route daily.  100 each  11  . Insulin Syringe-Needle U-100 30G X 5/16" 1 ML MISC Use it for injecting insulin once daily  100 each  11  . Lancets (ACCU-CHEK SOFT TOUCH) lancets Check your blood glucose 2 times a day before meals   100 each  11  . metFORMIN (GLUCOPHAGE) 500 MG tablet Take 2 tablets (1,000 mg total) by mouth 2 (two) times daily with a meal.  120 tablet  5  . metFORMIN (GLUCOPHAGE) 500 MG tablet Take 500 mg by mouth 2 (two) times daily with a meal.      . naproxen (NAPROSYN) 500 MG tablet Take 1 tablet (500 mg total) by mouth 2 (two) times daily.  30 tablet  0  . prednisoLONE acetate (PRED FORTE) 1 % ophthalmic suspension Place 1 drop into both eyes daily.  10 mL  11  . prednisoLONE acetate (PRED FORTE) 1 % ophthalmic suspension Place 1 drop into the left eye 4 (four) times daily.       No current facility-administered medications on file prior to visit.    Assessment/Plan

## 2013-01-28 NOTE — Patient Instructions (Signed)
General Instructions: For your diabetes, we are changing your insulin, to try to keep you from having so many high and low blood sugars. -STOP taking Lantus insulin (once per day insulin) -STOP taking Metformin -START taking NPH insulin (Relion N, 12 hour insulin). ----take 40 units every morning ----take 20 units every evening  Please return for a follow-up visit in 4-6 weeks                         .  -      (    )  -      -     NPH (Relion N 12  ).  ----  40     ----  20           4-6      Treatment Goals:  Goals (1 Years of Data) as of 01/28/13         As of Today 01/27/13 12/31/12 12/03/12 10/29/12     Blood Pressure    . Blood Pressure < 140/90  132/82 136/105 124/79 111/80 112/78     Result Component    . HEMOGLOBIN A1C < 8.0  9.8    12.1    . LDL CALC < 100            Progress Toward Treatment Goals:  Treatment Goal 01/28/2013  Hemoglobin A1C improved    Self Care Goals & Plans:  Self Care Goal 01/28/2013  Manage my medications take my medicines as prescribed; bring my medications to every visit; refill my medications on time  Monitor my health keep track of my blood glucose; bring my glucose meter and log to each visit; check my feet daily  Eat healthy foods drink diet soda or water instead of juice or soda; eat baked foods instead of fried foods  Be physically active take a walk every day    Home Blood Glucose Monitoring 12/03/2012  Check my blood sugar 3 times a day  When to check my blood sugar before breakfast; before lunch; after lunch; after dinner     Care Management & Community Referrals:  Referral 01/28/2013  Referrals made for care management support none needed

## 2013-01-28 NOTE — Assessment & Plan Note (Signed)
The patient notes pain control with naproxen given by the ED. The patient will followup with orthopedics, and prefers to make her own appointment, but will call us if she has difficulty getting an appointment.

## 2013-01-29 NOTE — ED Provider Notes (Signed)
Medical screening examination/treatment/procedure(s) were performed by non-physician practitioner and as supervising physician I was immediately available for consultation/collaboration.   Charles B. Sheldon, MD 01/29/13 2028 

## 2013-01-31 NOTE — Progress Notes (Signed)
Case discussed with Dr. Brown at the time of the visit.  We reviewed the resident's history and exam and pertinent patient test results.  I agree with the assessment, diagnosis, and plan of care documented in the resident's note. 

## 2013-02-23 ENCOUNTER — Telehealth: Payer: Self-pay | Admitting: Dietician

## 2013-02-23 NOTE — Telephone Encounter (Addendum)
  Patient son returned call, he agreed to call back with two days of blood sugar values and times to relay to Dr. Yetta Barre.

## 2013-03-23 ENCOUNTER — Encounter: Payer: Self-pay | Admitting: Internal Medicine

## 2013-03-28 ENCOUNTER — Emergency Department (HOSPITAL_COMMUNITY): Payer: Medicaid Other

## 2013-03-28 ENCOUNTER — Encounter (HOSPITAL_COMMUNITY): Payer: Self-pay | Admitting: Emergency Medicine

## 2013-03-28 ENCOUNTER — Emergency Department (HOSPITAL_COMMUNITY)
Admission: EM | Admit: 2013-03-28 | Discharge: 2013-03-28 | Disposition: A | Discharge status: Home / Self Care | Payer: Medicaid Other | Attending: Emergency Medicine | Admitting: Emergency Medicine

## 2013-03-28 DIAGNOSIS — L659 Nonscarring hair loss, unspecified: Secondary | ICD-10-CM | POA: Insufficient documentation

## 2013-03-28 DIAGNOSIS — E785 Hyperlipidemia, unspecified: Secondary | ICD-10-CM | POA: Insufficient documentation

## 2013-03-28 DIAGNOSIS — Z79899 Other long term (current) drug therapy: Secondary | ICD-10-CM | POA: Insufficient documentation

## 2013-03-28 DIAGNOSIS — N83202 Unspecified ovarian cyst, left side: Secondary | ICD-10-CM

## 2013-03-28 DIAGNOSIS — H409 Unspecified glaucoma: Secondary | ICD-10-CM | POA: Insufficient documentation

## 2013-03-28 DIAGNOSIS — D509 Iron deficiency anemia, unspecified: Secondary | ICD-10-CM | POA: Insufficient documentation

## 2013-03-28 DIAGNOSIS — Z3202 Encounter for pregnancy test, result negative: Secondary | ICD-10-CM | POA: Insufficient documentation

## 2013-03-28 DIAGNOSIS — B373 Candidiasis of vulva and vagina: Secondary | ICD-10-CM | POA: Insufficient documentation

## 2013-03-28 DIAGNOSIS — Z794 Long term (current) use of insulin: Secondary | ICD-10-CM | POA: Insufficient documentation

## 2013-03-28 DIAGNOSIS — E1169 Type 2 diabetes mellitus with other specified complication: Secondary | ICD-10-CM | POA: Insufficient documentation

## 2013-03-28 DIAGNOSIS — B3731 Acute candidiasis of vulva and vagina: Secondary | ICD-10-CM | POA: Insufficient documentation

## 2013-03-28 DIAGNOSIS — B3749 Other urogenital candidiasis: Secondary | ICD-10-CM

## 2013-03-28 DIAGNOSIS — H4040X0 Glaucoma secondary to eye inflammation, unspecified eye, stage unspecified: Secondary | ICD-10-CM | POA: Insufficient documentation

## 2013-03-28 DIAGNOSIS — H5440 Blindness, one eye, unspecified eye: Secondary | ICD-10-CM | POA: Insufficient documentation

## 2013-03-28 DIAGNOSIS — N83209 Unspecified ovarian cyst, unspecified side: Secondary | ICD-10-CM | POA: Insufficient documentation

## 2013-03-28 DIAGNOSIS — R1110 Vomiting, unspecified: Secondary | ICD-10-CM | POA: Insufficient documentation

## 2013-03-28 DIAGNOSIS — N390 Urinary tract infection, site not specified: Secondary | ICD-10-CM | POA: Insufficient documentation

## 2013-03-28 DIAGNOSIS — Z8742 Personal history of other diseases of the female genital tract: Secondary | ICD-10-CM | POA: Insufficient documentation

## 2013-03-28 LAB — CBC WITH DIFFERENTIAL/PLATELET
Basophils Relative: 0 % (ref 0–1)
Hemoglobin: 10.5 g/dL — ABNORMAL LOW (ref 12.0–15.0)
Lymphocytes Relative: 35 % (ref 12–46)
Lymphs Abs: 2.4 10*3/uL (ref 0.7–4.0)
Monocytes Relative: 6 % (ref 3–12)
Neutro Abs: 3.6 10*3/uL (ref 1.7–7.7)
Neutrophils Relative %: 53 % (ref 43–77)
RBC: 4.76 MIL/uL (ref 3.87–5.11)
RDW: 15.9 % — ABNORMAL HIGH (ref 11.5–15.5)
WBC: 6.8 10*3/uL (ref 4.0–10.5)

## 2013-03-28 LAB — URINE MICROSCOPIC-ADD ON

## 2013-03-28 LAB — POCT PREGNANCY, URINE: Preg Test, Ur: NEGATIVE

## 2013-03-28 LAB — COMPREHENSIVE METABOLIC PANEL
Albumin: 3.3 g/dL — ABNORMAL LOW (ref 3.5–5.2)
Alkaline Phosphatase: 112 U/L (ref 39–117)
BUN: 11 mg/dL (ref 6–23)
CO2: 26 mEq/L (ref 19–32)
Calcium: 8.9 mg/dL (ref 8.4–10.5)
Chloride: 97 mEq/L (ref 96–112)
Glucose, Bld: 305 mg/dL — ABNORMAL HIGH (ref 70–99)
Potassium: 4.6 mEq/L (ref 3.5–5.1)
Total Bilirubin: 0.2 mg/dL — ABNORMAL LOW (ref 0.3–1.2)

## 2013-03-28 LAB — URINALYSIS, ROUTINE W REFLEX MICROSCOPIC
Glucose, UA: >1000 mg/dL — AB
Ketones, ur: NEGATIVE mg/dL
Leukocytes, UA: NEGATIVE
Protein, ur: NEGATIVE mg/dL

## 2013-03-28 MED ORDER — MORPHINE SULFATE 4 MG/ML IJ SOLN
4.0000 mg | Freq: Once | INTRAMUSCULAR | Status: AC
  Administered 2013-03-28: 4 mg via INTRAVENOUS
  Filled 2013-03-28: qty 1

## 2013-03-28 MED ORDER — FLUCONAZOLE 150 MG PO TABS: 150.0000 mg | ORAL_TABLET | Freq: Every day | ORAL | Status: DC

## 2013-03-28 MED ORDER — ONDANSETRON HCL 4 MG/2ML IJ SOLN
4.0000 mg | Freq: Once | INTRAMUSCULAR | Status: AC
  Administered 2013-03-28: 4 mg via INTRAVENOUS
  Filled 2013-03-28: qty 2

## 2013-03-28 MED ORDER — HYDROCODONE-ACETAMINOPHEN 5-325 MG PO TABS: 2.0000 | ORAL_TABLET | ORAL | Status: DC | PRN

## 2013-03-28 MED ORDER — SODIUM CHLORIDE 0.9 % IV SOLN
Freq: Once | INTRAVENOUS | Status: AC
  Administered 2013-03-28: 07:00:00 via INTRAVENOUS

## 2013-03-28 NOTE — ED Notes (Signed)
The pt has had rt flank pain for 3 days .  She vomited yesterday

## 2013-03-28 NOTE — ED Notes (Signed)
Patient transported to Ultrasound 

## 2013-03-28 NOTE — ED Provider Notes (Signed)
CSN: [id number]     Arrival date & time 03/28/13  0507 History   First MD Initiated Contact with Patient 03/28/13 [phone]     Chief Complaint  Patient presents with  . Flank Pain   (Consider location/radiation/quality/duration/timing/severity/associated sxs/prior Treatment) Patient is a 42 y.o. female presenting with flank pain. The history is provided by the patient. No language interpreter was used.  Flank Pain This is a new problem. Episode onset: 3 days. The problem occurs constantly. The problem has been resolved. Associated symptoms include abdominal pain and vomiting. Nothing aggravates the symptoms. She has tried nothing for the symptoms. The treatment provided moderate relief.    Past Medical History  Diagnosis Date  . Routine/ritual circumcision   . Anemia, iron deficiency   . Glaucoma associated with ocular inflammation   . Uveitis   . Decreased visual acuity     Left eye  . Hair loss   . Hyperlipidemia   . Pap smear abnormality of cervix with LGSIL   . Type II diabetes mellitus   . Diabetes mellitus without complication   . Hypercholesteremia   . Blindness of left eye    Past Surgical History  Procedure Laterality Date  . Cesarean section     No family history on file. History  Substance Use Topics  . Smoking status: Never Smoker   . Smokeless tobacco: Not on file  . Alcohol Use: No   OB History   Grav Para Term Preterm Abortions TAB SAB Ect Mult Living   7 4 4  3  3   4      Review of Systems  Gastrointestinal: Positive for vomiting and abdominal pain.  Genitourinary: Positive for flank pain.  All other systems reviewed and are negative.    Allergies  Review of patient's allergies indicates no known allergies.  Home Medications   Current Outpatient Rx  Name  Route  Sig  Dispense  Refill  . atorvastatin (LIPITOR) 20 MG tablet   Oral   Take 20 mg by mouth daily.         . dorzolamide-timolol (COSOPT) 22.3-6.8 MG/ML ophthalmic solution   Left  Eye   Place 1 drop into the left eye 2 (two) times daily.         . insulin NPH (HUMULIN N,NOVOLIN N) 100 UNIT/ML injection   Subcutaneous   Inject 20-40 Units into the skin See admin instructions. Takes 40 units in the morning,and  20 units in the evening         . prednisoLONE acetate (PRED FORTE) 1 % ophthalmic suspension   Both Eyes   Place 1 drop into both eyes 2 (two) times daily.         Marland Kitchen glucose blood (ACCU-CHEK SMARTVIEW) test strip      Check blood sugar 4 times daily  Before meal and bedtime dx code 250.52  Insulin requiring   150 each   12   . Insulin Syringe-Needle U-100 (RELION INSULIN SYRINGE 1ML/31G) 31G X 5/16" 1 ML MISC   Does not apply   1 each by Does not apply route daily.   100 each   11   . Lancets (ACCU-CHEK SOFT TOUCH) lancets      Check your blood glucose 2 times a day before meals   100 each   11    BP 97/45  Pulse 92  Temp(Src) 97.9 F (36.6 C) (Oral)  Resp 16  SpO2 100% Physical Exam  Nursing note and vitals reviewed. Constitutional:  She is oriented to person, place, and time. She appears well-developed and well-nourished.  HENT:  Head: Normocephalic.  Eyes: Conjunctivae are normal. Pupils are equal, round, and reactive to light.  Neck: Normal range of motion.  Cardiovascular: Normal rate and normal heart sounds.   Pulmonary/Chest: Effort normal and breath sounds normal.  Abdominal: Soft. There is tenderness.  Musculoskeletal: Normal range of motion.  Neurological: She is alert and oriented to person, place, and time. She has normal reflexes.  Skin: Skin is warm.  Psychiatric: She has a normal mood and affect.    ED Course  Procedures (including critical care time) Labs Review Labs Reviewed  CBC WITH DIFFERENTIAL  COMPREHENSIVE METABOLIC PANEL  URINALYSIS, ROUTINE W REFLEX MICROSCOPIC  LIPASE, BLOOD   Imaging Review No results found.  EKG Interpretation   None       MDM ct shows probable left ovarian cyst.   Confirmed by ultrasound.  Labs elevated glucose.  Urine shows yeast infection.      1. Left ovarian cyst   2. Yeast UTI    Pt counseled on results.  Diflucan for yeast.   Hydrocodone for pain.  Pt advised to follow up with her medicine doctor for evaluation.    Lonia Skinner [place], PA-C 03/28/13 1330

## 2013-03-28 NOTE — ED Notes (Signed)
Pt states right side flank pain with increased frequency. Pt speaks Arabic, and family is translating. Denies hematuria.

## 2013-03-29 NOTE — ED Provider Notes (Signed)
Medical screening examination/treatment/procedure(s) were performed by non-physician practitioner and as supervising physician I was immediately available for consultation/collaboration.   Lynnlee Revels, MD 03/29/13 0042 

## 2013-04-02 ENCOUNTER — Ambulatory Visit: Payer: Medicaid Other | Admitting: Internal Medicine

## 2013-05-06 ENCOUNTER — Encounter: Payer: Medicaid Other | Admitting: Internal Medicine

## 2013-05-06 ENCOUNTER — Ambulatory Visit (INDEPENDENT_AMBULATORY_CARE_PROVIDER_SITE_OTHER): Payer: Medicaid Other | Admitting: Internal Medicine

## 2013-05-06 VITALS — BP 121/72 | HR 79 | Temp 97.7°F | Ht 66.0 in | Wt 197.2 lb

## 2013-05-06 DIAGNOSIS — S92901A Unspecified fracture of right foot, initial encounter for closed fracture: Secondary | ICD-10-CM

## 2013-05-06 DIAGNOSIS — E1165 Type 2 diabetes mellitus with hyperglycemia: Principal | ICD-10-CM

## 2013-05-06 DIAGNOSIS — S92909A Unspecified fracture of unspecified foot, initial encounter for closed fracture: Secondary | ICD-10-CM

## 2013-05-06 DIAGNOSIS — L853 Xerosis cutis: Secondary | ICD-10-CM

## 2013-05-06 DIAGNOSIS — L738 Other specified follicular disorders: Secondary | ICD-10-CM

## 2013-05-06 DIAGNOSIS — E1139 Type 2 diabetes mellitus with other diabetic ophthalmic complication: Secondary | ICD-10-CM

## 2013-05-06 LAB — POCT GLYCOSYLATED HEMOGLOBIN (HGB A1C): Hemoglobin A1C: 11.4

## 2013-05-06 LAB — GLUCOSE, CAPILLARY: Glucose-Capillary: 209 mg/dL — ABNORMAL HIGH (ref 70–99)

## 2013-05-06 MED ORDER — CARRINGTON MOISTURE BARRIER EX CREA: TOPICAL_CREAM | CUTANEOUS | Status: DC | PRN

## 2013-05-06 MED ORDER — INSULIN NPH (HUMAN) (ISOPHANE) 100 UNIT/ML ~~LOC~~ SUSP: 40.0000 [IU] | Freq: Two times a day (BID) | SUBCUTANEOUS | Status: DC

## 2013-05-06 NOTE — Progress Notes (Signed)
   Subjective:    [name], female    DOB: [birth year], 43 y.o.   MRN: [id number]  HPI Ms. Birkland is a 43 yo woman accompanied by an interpreter for DM follow up.   Pt was recently d/c off metformin and lantus given AM hypoglycemia and started on NPH 40 units AM and 20 units PM. Patient reports compliance with this medication with no repeat hypoglycemic episodes. She brings in her meter today and CBG readings were reviewed with the patient. He prandials include readings anywhere between the 2-300 range and maximum reading of 400 and lowest reading of 100. Patient is aware of her hypoglycemic symptoms and has not had any since starting this new regiment. She states several dietary habits include eating a large pizza every weekend with sodas whenever her sister's visit and she is unaware of high carbohydrate-containing items in her diet. She is having some polydipsia but no polyuria at this time.her only other complaint is some dry skin lately and wanted prescription for lotion. She has also been using some new lung 3 soap detergent but had no frank rashes, shortness of breath, throat swelling, or pustule lesions.  She had also a recent right ankle fracture diagnosed back in 10/14 and was seen by orthopedics the patient is still having some concern and wanted a referral. She is still able to ambulate has had no decreased sensation or muscle weakness in the ankle.  Review of Systems  Constitutional: Negative for fever, chills, appetite change and fatigue.  Respiratory: Negative for cough and shortness of breath.   Cardiovascular: Negative for chest pain, palpitations and leg swelling.  Gastrointestinal: Negative for nausea, vomiting, abdominal pain, diarrhea and constipation.  Endocrine: Positive for polydipsia. Negative for polyuria.  Genitourinary: Negative for dysuria.  Neurological: Negative for dizziness, weakness, light-headedness and headaches.    Past Medical History    Diagnosis Date  . Routine/ritual circumcision   . Anemia, iron deficiency   . Glaucoma associated with ocular inflammation   . Uveitis   . Decreased visual acuity     Left eye  . Hair loss   . Hyperlipidemia   . Pap smear abnormality of cervix with LGSIL   . Type II diabetes mellitus   . Diabetes mellitus without complication   . Hypercholesteremia   . Blindness of left eye     Social, surgical, family history reviewed with patient and updated in appropriate chart locations.     Objective:   Physical Exam Filed Vitals:   05/06/13 1543  BP: 121/72  Pulse: 79  Temp: 97.7 F (36.5 C)   General: sitting in chair, NAD HEENT: PERRL, EOMI, no scleral icterus Cardiac: RRR, no rubs, murmurs or gallops Pulm: clear to auscultation bilaterally, no crackles, wheezes or rhonchi, moving normal volumes of air Abd: soft, nontender, nondistended, BS present Ext: warm and well perfused, no pedal edema, ankle FROM bilaterally, no decreased sensation, no ttp over malleolus bilaterally Skin: no frank rashes, some flaking of upper extremities diffusely, no papules, or erythema Neuro: alert and oriented X3, cranial nerves II-XII grossly intact    Assessment & Plan:  Please see problem oriented charting  Pt discussed with Dr. Lynnae January

## 2013-05-06 NOTE — Patient Instructions (Signed)
Increase your insulin to 40 units in the morning and 40 units at night.   Make an appointment with our diabetes educator to learn about your diet.   See you back in 1 month to check your diabetes.

## 2013-05-07 DIAGNOSIS — L853 Xerosis cutis: Secondary | ICD-10-CM | POA: Insufficient documentation

## 2013-05-07 NOTE — Progress Notes (Signed)
Case discussed with Dr. Sadek soon after the resident saw the patient.  We reviewed the resident's history and exam and pertinent patient test results.  I agree with the assessment, diagnosis, and plan of care documented in the resident's note. 

## 2013-05-07 NOTE — Assessment & Plan Note (Signed)
There are no frank rashes or lesions on skin exam today patient has had some new detergent and given winter season may be secondary to dry skin. -Eucerin cream -Reevaluate can consider TSHif having other symptoms should eval at next visit if lotion doesn't work

## 2013-05-07 NOTE — Assessment & Plan Note (Signed)
The patient previously had a right fifth phalanx fracture on 01/27/13 and was seen by orthopedics afterwards and patient would like to return and be seen by orthopedics for this same injury. -Orthopedic referral

## 2013-05-07 NOTE — Assessment & Plan Note (Addendum)
Lab Results  Component Value Date   HGBA1C 11.4 05/06/2013   Patient seems to be having worsening control as last hemoglobin A1c was 9.8 on 01/28/13 she was discontinued off metformin and Lantus 55 at that time and started on insulin NPH 40 units a.m. And 20 units p.m. Given hypoglycemia in the morning. Per review of his CBG readings today patient is no longer having hypoglycemic episodes and preprandial skin anywhere between the 2 to 300s. -Therefore will increase NPH to 40 units twice a day -Followup in one month -Should consider restarting metformin at next evaluation given glycemic control and review -Patient was given glucose tabs just in case of hypoglycemia -patient is up-to-date with all other diabetes metrics

## 2013-05-13 ENCOUNTER — Ambulatory Visit (INDEPENDENT_AMBULATORY_CARE_PROVIDER_SITE_OTHER): Payer: Medicaid Other | Admitting: Dietician

## 2013-05-13 ENCOUNTER — Encounter: Payer: Self-pay | Admitting: Internal Medicine

## 2013-05-13 ENCOUNTER — Ambulatory Visit (INDEPENDENT_AMBULATORY_CARE_PROVIDER_SITE_OTHER): Payer: Medicaid Other | Admitting: Internal Medicine

## 2013-05-13 VITALS — BP 126/74 | HR 87 | Temp 97.1°F | Wt 203.2 lb

## 2013-05-13 DIAGNOSIS — D649 Anemia, unspecified: Secondary | ICD-10-CM

## 2013-05-13 DIAGNOSIS — D509 Iron deficiency anemia, unspecified: Secondary | ICD-10-CM | POA: Insufficient documentation

## 2013-05-13 DIAGNOSIS — E1165 Type 2 diabetes mellitus with hyperglycemia: Principal | ICD-10-CM

## 2013-05-13 DIAGNOSIS — E1139 Type 2 diabetes mellitus with other diabetic ophthalmic complication: Secondary | ICD-10-CM

## 2013-05-13 DIAGNOSIS — E1142 Type 2 diabetes mellitus with diabetic polyneuropathy: Secondary | ICD-10-CM

## 2013-05-13 DIAGNOSIS — E1140 Type 2 diabetes mellitus with diabetic neuropathy, unspecified: Secondary | ICD-10-CM | POA: Insufficient documentation

## 2013-05-13 DIAGNOSIS — E1149 Type 2 diabetes mellitus with other diabetic neurological complication: Secondary | ICD-10-CM

## 2013-05-13 HISTORY — DX: Iron deficiency anemia, unspecified: D50.9

## 2013-05-13 MED ORDER — GABAPENTIN 100 MG PO CAPS: 100.0000 mg | ORAL_CAPSULE | Freq: Three times a day (TID) | ORAL | Status: DC

## 2013-05-13 NOTE — Progress Notes (Signed)
Medical Nutrition Therapy:  Appt start time: 1030 end time:  1100.  Assessment:  Primary concerns today: Meal planning. And blood sugar Patient is here with son today. She did not bring meter today, but reports blood sugars better on 40 units N twice daily. Her blood sugar this am was 122. Per physician note,  she was unable to identify foods and appropriate portions of those foods that raise blood sugars. Her previous goals were to take her medicine as prescribed which she reports adherence to today. Previous meal planning method used was plate method/healthy choices to simplify concepts. Usual eating pattern includes 2-3 meals and 2-3 snacks per day. Today had her make two piles using food models- 1 with foods that make blood sugars go up( high carb) and 2- with foods that have little or no effect on blood sugar. She was abel to do this except for putting a few starchy vegetables and all fruit in to the has little or no affect on blood sugars. Son was also interactive in this discussion and says he will try to do their food shopping using this information.  Frequent foods include beans, pitas, juice, whole milk, tea.  Avoided foods include pork.   24-hr recall:-    Breakfast/Snk ( AM)- tea with 8 oz whole milk and 1 tsp sugar, fruit   L - Beans, meat, vegetables, sometimes homemade soup with meat and vegetables and pita  Snk - tea with 8 oz whole milk and 1 tsp sugar  D - Beans, vegetables, rice, bread- pitas, sometimes juice  Snk - sometimes cookies  Usual physical activity includes activities of daily living for her child care, cooking and keeping house.  Progress Towards Goal(s):  Some progress.   Nutritional Diagnosis:  NB-1.1 Food and nutrition-related knowledge deficit As related to lack of prior exposure for son and misunderstanding for patient.  As evidenced by what foods she identified as raising blood sugars significantly..    Intervention:  Nutrition education using food  models/visuals to teach concept of food to limit portions to help control blood sugar.  Monitoring/Evaluation:  Dietary intake, exercise, blood sugars/meter, and body weight in 2 month(s).

## 2013-05-13 NOTE — Progress Notes (Signed)
   Subjective:    [name], female    DOB: 11-18-1970, 43 y.o.   MRN: [id number]  HPI  Jennifer [name] is a 43 yo woman pmh as listed below accompanied by her son who acts as a Optometrist who presents for foot pain.   Patient states this has been an ongoing problem may be worse as her diabetes has worsened. It feels like someone has "tied the bottom of her feet together like a tight rope." she's had no trouble ambulating nothing seems to exacerbate or lessen the pain. It is intermittent. She has had no repeat trauma to the area or falls. She doesn't necessarily seem to feel shooting pain or have any back pain. No fevers or chills or suspicious rashes. She has not tried anything over-the-counter to help alleviate the pain. She was previously only wearing flip-flops and has just begun wearing shoes given the cold weather.  The patient and her son both state that they're having trouble maintaining heat in her home given some unsatisfactory Dr. Work that was done from an outside client.  Review of Systems  Constitutional: Negative for fever, chills, activity change and fatigue.  Eyes: Negative for visual disturbance.  Respiratory: Negative for cough and shortness of breath.   Cardiovascular: Negative for chest pain, palpitations and leg swelling.  Musculoskeletal: Positive for myalgias (bottom of her feet). Negative for arthralgias, back pain, gait problem and joint swelling.  Skin: Negative for color change, rash and wound.  Neurological: Negative for dizziness, weakness, numbness and headaches.    Past Medical History  Diagnosis Date  . Routine/ritual circumcision   . Anemia, iron deficiency   . Glaucoma associated with ocular inflammation   . Uveitis   . Decreased visual acuity     Left eye  . Hair loss   . Hyperlipidemia   . Pap smear abnormality of cervix with LGSIL   . Type II diabetes mellitus   . Diabetes mellitus without complication   . Hypercholesteremia   .  Blindness of left eye    Social, surgical, family history reviewed with patient and updated in appropriate chart locations.      Objective:   Physical Exam Filed Vitals:   05/13/13 1005  BP: 126/74  Pulse: 87  Temp: 97.1 F (36.2 C)   General: sitting in chair, NAD HEENT: PERRL, EOMI, no scleral icterus Cardiac: RRR, no rubs, murmurs or gallops Pulm: clear to auscultation bilaterally, moving normal volumes of air Abd: soft, nontender, nondistended, BS present Ext: warm and well perfused, no pedal edema, both ankles FROM, negative talar tilt test, negative anterior drawer test bilaterally, no effusions, no ttp Neuro: alert and oriented X3, cranial nerves II-XII grossly intact     Assessment & Plan:  Please see problem oriented charting  Pt discussed with Dr. Lynnae January

## 2013-05-13 NOTE — Assessment & Plan Note (Signed)
Patient did suffer a right ankle fracture back in 10/14 and has not had any reinjury to the areas. It is less likely that this is a complication from that initial event as both of her feet are bothering her. She does have an atypical description of what could be diabetic neuropathy. She does not have any concerning findings on physical exam for muscle strain, sprain, fracture, neurologic deficiency such as vitamin B12 or folate. -Trial of gabapentin 100 mg 3 times a day -Patient will be seeing orthopedics for her previous fracture on 05/18/13 -Continue to improve diabetes control

## 2013-05-13 NOTE — Assessment & Plan Note (Signed)
Although this was not directly addressed during this visit per chart review it seems that the patient has had fluctuating anemia with periods of good hemoglobin in the 14-15 this and then down trends to the 10-12. Her most recent hemoglobin was also  Lab Results  Component Value Date   HGB 10.5* 03/28/2013   Her last anemia panel was completed back in 2011 and did show some thin typical of iron deficiency anemia. The patient is still menstruating which may be the etiology but early colon cancer should be excluded or the patient may need iron supplementation. This decision will not be made until a repeat CBC and anemia panel can be completed at her next visit. -CBC at next visit, -Anemia panel at next visit

## 2013-05-13 NOTE — Progress Notes (Signed)
Case discussed with Dr. Sadek soon after the resident saw the patient.  We reviewed the resident's history and exam and pertinent patient test results.  I agree with the assessment, diagnosis, and plan of care documented in the resident's note. 

## 2013-06-11 ENCOUNTER — Encounter: Payer: Medicaid Other | Admitting: Internal Medicine

## 2013-06-25 ENCOUNTER — Ambulatory Visit (INDEPENDENT_AMBULATORY_CARE_PROVIDER_SITE_OTHER): Payer: Medicaid Other | Admitting: Internal Medicine

## 2013-06-25 ENCOUNTER — Encounter: Payer: Self-pay | Admitting: Internal Medicine

## 2013-06-25 VITALS — BP 119/79 | HR 85 | Temp 97.2°F | Ht 66.0 in | Wt 205.9 lb

## 2013-06-25 DIAGNOSIS — E1165 Type 2 diabetes mellitus with hyperglycemia: Principal | ICD-10-CM

## 2013-06-25 DIAGNOSIS — E1140 Type 2 diabetes mellitus with diabetic neuropathy, unspecified: Secondary | ICD-10-CM

## 2013-06-25 DIAGNOSIS — E1149 Type 2 diabetes mellitus with other diabetic neurological complication: Secondary | ICD-10-CM

## 2013-06-25 DIAGNOSIS — E1139 Type 2 diabetes mellitus with other diabetic ophthalmic complication: Secondary | ICD-10-CM

## 2013-06-25 DIAGNOSIS — E1142 Type 2 diabetes mellitus with diabetic polyneuropathy: Secondary | ICD-10-CM

## 2013-06-25 LAB — GLUCOSE, CAPILLARY: GLUCOSE-CAPILLARY: 288 mg/dL — AB (ref 70–99)

## 2013-06-25 MED ORDER — "INSULIN SYRINGE-NEEDLE U-100 31G X 5/16"" 1 ML MISC": 1.0000 | Freq: Every day | Status: DC

## 2013-06-25 NOTE — Patient Instructions (Signed)
You are doing wonderful !!   I am so happy alhamduallah you are doing better [employer]

## 2013-06-25 NOTE — Progress Notes (Signed)
   Subjective:    [name], female    DOB: 01-20-1971, 43 y.o.   MRN: [id number]  HPI Jennifer [name] is a [place] speaking [place] woman with pmh as listed below accompanied by a translator who presents for follow up on her foot pain and diabetes.   She states that she has had total resolution of her foot pain with use of the gabapentin TID. She continues to ambulate well and is not having anymore shooting pains at night. She states she has returned to her baseline activites.   In terms of her diabetes she doesn't bring in her meter today but remembers some of her CBG readings. She is checking her sugars at least QID AC. She is having preprandial readings of 85-100 before her first meal and as high as 300 15 min after meals when she is checking again. She has not had any repeat hypoglycemic events as this has been a problem in the past. She is aware of her hypoglycemic symptoms that include feeilng drowsy, sweaty, or sleepy. She now carries some oral glucose tablets but has not had to use any of these. She has also recently seen the diabetes educator in terms of diet education and meal planning. Therefore she has started to make some dietary changes.   Past Medical History  Diagnosis Date  . Routine/ritual circumcision   . Anemia, iron deficiency   . Glaucoma associated with ocular inflammation   . Uveitis   . Decreased visual acuity     Left eye  . Hair loss   . Hyperlipidemia   . Pap smear abnormality of cervix with LGSIL   . Type II diabetes mellitus   . Diabetes mellitus without complication   . Hypercholesteremia   . Blindness of left eye     Review of Systems  Constitutional: Positive for activity change. Negative for chills, diaphoresis and fatigue.  Eyes: Positive for visual disturbance (chronic changes given previously diagnosed legal blindness).  Respiratory: Negative for cough, chest tightness and shortness of breath.   Cardiovascular: Negative for chest pain,  palpitations and leg swelling.  Gastrointestinal: Negative for nausea, abdominal pain, diarrhea and constipation.  Endocrine: Negative for polydipsia and polyuria.  Genitourinary: Negative for frequency and difficulty urinating.  Musculoskeletal: Negative for arthralgias, back pain, gait problem, joint swelling and myalgias.  Skin: Negative for wound.  Neurological: Negative for dizziness, tremors, weakness and headaches.      Objective:   Physical Exam Filed Vitals:   06/25/13 1553  BP: 119/79  Pulse: 85  Temp: 97.2 F (36.2 C)   General: sitting in chair, NAD Cardiac: RRR, no rubs, murmurs or gallops Pulm: clear to auscultation bilaterally, no crackles, no wheezes, moving normal volumes of air Abd: soft, nontender, nondistended, BS present Ext: warm and well perfused, no pedal edema Neuro: alert and oriented X3, cranial nerves II-XII grossly intact     Assessment & Plan:  Please see problem oriented charting  Pt discussed with Dr. Stann Mainland

## 2013-06-26 NOTE — Assessment & Plan Note (Signed)
Pt has had significant hx of hypoglycemic episodes although based on her HgbA1c must indicate large fluctuations of hyper and hypoglycemia previously. She continues to not have anymore hypoglycemic events per her report as she doesn't bring in her meter today.  -cont NPH 40 units BID -f/u in 1-2 months with CBG meter for titration or addition of oral agents

## 2013-06-26 NOTE — Assessment & Plan Note (Signed)
Pt appears to have complete resolution with trial of gabapentin. No continued pain or further neurological focal findings or symptoms. Pt was evaluated by ortho and feel her fracture has healed and non-contributory.  -cont gabapentin 100mg  TID -cont to improve glycemic control

## 2013-07-05 NOTE — Progress Notes (Signed)
Case discussed with Dr. Sadek soon after the resident saw the patient.  We reviewed the resident's history and exam and pertinent patient test results.  I agree with the assessment, diagnosis, and plan of care documented in the resident's note. 

## 2013-08-09 ENCOUNTER — Telehealth: Payer: Self-pay | Admitting: Internal Medicine

## 2013-08-09 NOTE — Telephone Encounter (Signed)
Good Afternoon  Dr. Algis Liming.  This patient's son dropped forms regarding [place] Citizenship and Immigration [id number], Scientist, research (life sciences) for Disability Exceptions 2 weeks ago.  He has called several times and again today insisting that these be completed immediately.  Her son's  name is Julien Nordmann and his phone number is [phone].  He would like for you personally to call him in  reference to not completing the forms, as he still does not understand why they are not being completed at this time.

## 2013-09-24 ENCOUNTER — Ambulatory Visit (INDEPENDENT_AMBULATORY_CARE_PROVIDER_SITE_OTHER): Payer: Medicaid Other | Admitting: Internal Medicine

## 2013-09-24 VITALS — BP 121/74 | HR 87 | Temp 97.9°F | Ht 66.0 in | Wt 205.5 lb

## 2013-09-24 DIAGNOSIS — E1165 Type 2 diabetes mellitus with hyperglycemia: Principal | ICD-10-CM

## 2013-09-24 DIAGNOSIS — E1139 Type 2 diabetes mellitus with other diabetic ophthalmic complication: Secondary | ICD-10-CM

## 2013-09-24 LAB — POCT GLYCOSYLATED HEMOGLOBIN (HGB A1C): HEMOGLOBIN A1C: 11.2

## 2013-09-24 LAB — GLUCOSE, CAPILLARY: Glucose-Capillary: 254 mg/dL — ABNORMAL HIGH (ref 70–99)

## 2013-09-24 MED ORDER — METFORMIN HCL 500 MG PO TABS: 500.0000 mg | ORAL_TABLET | Freq: Two times a day (BID) | ORAL | Status: DC

## 2013-09-24 MED ORDER — INSULIN NPH (HUMAN) (ISOPHANE) 100 UNIT/ML ~~LOC~~ SUSP: 40.0000 [IU] | Freq: Two times a day (BID) | SUBCUTANEOUS | Status: DC

## 2013-09-24 NOTE — Patient Instructions (Addendum)
General Instructions:   Thank you for bringing your medicines today. This helps [place] keep you safe from mistakes.   Progress Toward Treatment Goals:  Treatment Goal 01/28/2013  Hemoglobin A1C improved    Self Care Goals & Plans:  Self Care Goal 09/24/2013  Manage my medications take my medicines as prescribed; bring my medications to every visit; refill my medications on time  Monitor my health -  Eat healthy foods drink diet soda or water instead of juice or soda; eat more vegetables; eat foods that are low in salt; eat baked foods instead of fried foods; eat fruit for snacks and desserts  Be physically active -    Home Blood Glucose Monitoring 01/28/2013  Check my blood sugar once a day  When to check my blood sugar before meals     Care Management & Community Referrals:  Referral 01/28/2013  Referrals made for care management support none needed

## 2013-09-24 NOTE — Progress Notes (Signed)
Subjective:    [name], female    DOB: January 15, 1971, 43 y.o.   MRN: [id number]  HPI Jennifer [name] is a [place] speaking woman accompanied by a Optometrist here for DM recheck.   DM - Patient checking blood sugars 2 times daily, before breakfast and dinner but her meter has not been working for the past month and therefore no information was available. Currently taking Humulin R 40 units BID. No hypoglycemic episodes since last visit. denies polyuria, polydipsia, nausea, vomiting, diarrhea.  Does request refills today. Pt reports feeling better than she ever has including total resolution of her foot burning pain and even some slight improvement in her vision.   She is asking about the month of ramadan where she would like to fast.   Past Medical History  Diagnosis Date  . Routine/ritual circumcision   . Anemia, iron deficiency   . Glaucoma associated with ocular inflammation   . Uveitis   . Decreased visual acuity     Left eye  . Hair loss   . Hyperlipidemia   . Pap smear abnormality of cervix with LGSIL   . Type II diabetes mellitus   . Diabetes mellitus without complication   . Hypercholesteremia   . Blindness of left eye    Current Outpatient Prescriptions on File Prior to Visit  Medication Sig Dispense Refill  . atorvastatin (LIPITOR) 20 MG tablet Take 20 mg by mouth daily.      . dorzolamide-timolol (COSOPT) 22.3-6.8 MG/ML ophthalmic solution Place 1 drop into the left eye 2 (two) times daily.      . fluconazole (DIFLUCAN) 150 MG tablet Take 1 tablet (150 mg total) by mouth daily.  7 tablet  0  . gabapentin (NEURONTIN) 100 MG capsule Take 1 capsule (100 mg total) by mouth 3 (three) times daily.  90 capsule  2  . glucose blood (ACCU-CHEK SMARTVIEW) test strip Check blood sugar 4 times daily  Before meal and bedtime dx code 250.52  Insulin requiring  150 each  12  . HYDROcodone-acetaminophen (NORCO/VICODIN) 5-325 MG per tablet Take 2 tablets by mouth every  4 (four) hours as needed for moderate pain.  20 tablet  0  . insulin NPH Human (HUMULIN N,NOVOLIN N) 100 UNIT/ML injection Inject 40 Units into the skin 2 (two) times daily before a meal. Takes 40 units in the morning,and  20 units in the evening  10 mL  12  . Insulin Syringe-Needle U-100 (RELION INSULIN SYRINGE 1ML/31G) 31G X 5/16" 1 ML MISC 1 each by Does not apply route daily.  100 each  11  . Lancets (ACCU-CHEK SOFT TOUCH) lancets Check your blood glucose 2 times a day before meals  100 each  11  . prednisoLONE acetate (PRED FORTE) 1 % ophthalmic suspension Place 1 drop into both eyes 2 (two) times daily.      . Skin Protectants, Misc. (EUCERIN) cream Apply topically as needed for wound care.  397 g  0   No current facility-administered medications on file prior to visit.   Review of Systems  History obtained from chart review and the patient General ROS: negative for - chills, fatigue, fever, night sweats or weight gain Endocrine ROS: negative for - polydipsia/polyuria or skin changes Respiratory ROS: no cough, shortness of breath, or wheezing Cardiovascular ROS: no chest pain or dyspnea on exertion Genito-Urinary ROS: no dysuria, trouble voiding, or hematuria Neurological ROS: negative for - dizziness, headaches or numbness/tingling     Objective:  Physical Exam Filed Vitals:   09/24/13 1559  BP: 121/74  Pulse: 87  Temp: 97.9 F (36.6 C)   General: sitting in chair, NAD HEENT: PERRL, EOMI, no scleral icterus Cardiac: RRR, no rubs, murmurs or gallops Pulm: clear to auscultation bilaterally, moving normal volumes of air Abd: soft, nontender, nondistended, BS present Ext: warm and well perfused, no pedal edema Neuro: alert and oriented X3, cranial nerves II-XII grossly intact    Assessment & Plan:  Please see problem oriented charting  Pt discussed with Dr. Daryll Drown

## 2013-09-25 NOTE — Assessment & Plan Note (Signed)
Lab Results  Component Value Date   HGBA1C 11.2 09/24/2013   HGBA1C 11.4 05/06/2013   HGBA1C 9.8 01/28/2013     Assessment: Diabetes control:   similar to previous with no improvement  Progress toward A1C goal:    Comments: it is hard to pinpoint exact problem at this point as pt did not have a working meter. It is good to know that patient has not had any more hypoglycemic episodes as was the difficult management problem when pt did have good glycemic control  Plan: Medications:  will continue humulin at 40 units BID and add metformin 500mg  bid  Home glucose monitoring: Frequency:   Timing:   Instruction/counseling given: reminded to bring blood glucose meter & log to each visit, discussed foot care, discussed the need for weight loss, discussed diet and other instruction/counseling: said it would be harmful to fast during her religious holiday Educational resources provided:   Self management tools provided: copy of home glucose meter download Other plans: will follow up in 2-4 wks for reassesment

## 2013-09-27 ENCOUNTER — Telehealth: Payer: Self-pay | Admitting: Dietician

## 2013-09-27 NOTE — Telephone Encounter (Signed)
Used [employer] to call patient: she bought a battery and her meter is working now. She asks to change her upcoming appointment to a Friday afternoon after 3 PM. Will route this note to notify front office of her request.

## 2013-09-30 NOTE — Progress Notes (Signed)
Case discussed with Dr. Sadek soon after the resident saw the patient.  We reviewed the resident's history and exam and pertinent patient test results.  I agree with the assessment, diagnosis, and plan of care documented in the resident's note. 

## 2013-10-01 ENCOUNTER — Other Ambulatory Visit: Payer: Self-pay | Admitting: Internal Medicine

## 2013-10-29 ENCOUNTER — Encounter: Payer: Medicaid Other | Admitting: Internal Medicine

## 2014-02-14 ENCOUNTER — Other Ambulatory Visit: Payer: Self-pay | Admitting: Internal Medicine

## 2014-02-14 DIAGNOSIS — E113219 Type 2 diabetes mellitus with mild nonproliferative diabetic retinopathy with macular edema, unspecified eye: Secondary | ICD-10-CM

## 2014-02-15 ENCOUNTER — Encounter: Payer: Self-pay | Admitting: *Deleted

## 2014-02-15 ENCOUNTER — Other Ambulatory Visit: Payer: Self-pay | Admitting: Internal Medicine

## 2014-02-15 MED ORDER — DOXYCYCLINE HYCLATE 100 MG PO CAPS: 100.0000 mg | ORAL_CAPSULE | Freq: Two times a day (BID) | ORAL | Status: DC

## 2014-02-15 NOTE — Progress Notes (Signed)
Dr Lynnae January in to see pt

## 2014-02-15 NOTE — Progress Notes (Signed)
Pt with uncontrolled DM on insulin and anemia. She presents with husband for 2 days of a sore on L post calf that is slightly painful and itchy. No preceding insult - no burn, bite, blister. About two yrs ago she indicated had something similar under arms but language is a barrier. She denies any other similar sites elsewhere. Denies drainage but there is a clear serous drainage from the spot spontaneously.   On exam, there is a 1 cm blister with a white top with pinpoint spot of drainage which is a clear, thin liquid. There is an area of 2 cm surrounding erythema. With pressure, the fluid was able to be drained. The blister was mostly flat at the end of the visit.   This is not an abscess as the fluid was serous and not pus. It appeared to be a burn. I was concerned about the surrounding erythema and didn't want pt to use topical creams. Plan 1. Doxy 5 days 2. Warm compresses 3. Nothing topical - keep clean 4. Return end of week for wound check and routine care, inc DM. 5. To return sooner if worse

## 2014-02-15 NOTE — Progress Notes (Signed)
Appointment at 3:15 on Friday 10/30 Son is aware

## 2014-02-18 ENCOUNTER — Encounter: Payer: Self-pay | Admitting: Internal Medicine

## 2014-02-18 ENCOUNTER — Ambulatory Visit (INDEPENDENT_AMBULATORY_CARE_PROVIDER_SITE_OTHER): Payer: Medicaid Other | Admitting: Internal Medicine

## 2014-02-18 VITALS — BP 129/66 | HR 81 | Temp 98.1°F | Wt 200.9 lb

## 2014-02-18 DIAGNOSIS — E113219 Type 2 diabetes mellitus with mild nonproliferative diabetic retinopathy with macular edema, unspecified eye: Secondary | ICD-10-CM

## 2014-02-18 DIAGNOSIS — Z0000 Encounter for general adult medical examination without abnormal findings: Secondary | ICD-10-CM

## 2014-02-18 DIAGNOSIS — S81802A Unspecified open wound, left lower leg, initial encounter: Secondary | ICD-10-CM | POA: Insufficient documentation

## 2014-02-18 DIAGNOSIS — E785 Hyperlipidemia, unspecified: Secondary | ICD-10-CM

## 2014-02-18 DIAGNOSIS — S81802D Unspecified open wound, left lower leg, subsequent encounter: Secondary | ICD-10-CM

## 2014-02-18 DIAGNOSIS — D649 Anemia, unspecified: Secondary | ICD-10-CM

## 2014-02-18 DIAGNOSIS — E11321 Type 2 diabetes mellitus with mild nonproliferative diabetic retinopathy with macular edema: Secondary | ICD-10-CM

## 2014-02-18 LAB — POCT GLYCOSYLATED HEMOGLOBIN (HGB A1C): HEMOGLOBIN A1C: 11.4

## 2014-02-18 LAB — GLUCOSE, CAPILLARY: Glucose-Capillary: 268 mg/dL — ABNORMAL HIGH (ref 70–99)

## 2014-02-18 NOTE — Assessment & Plan Note (Signed)
-  check lipid panel  

## 2014-02-18 NOTE — Assessment & Plan Note (Signed)
-  would like to defer influenza vaccine until next week

## 2014-02-18 NOTE — Progress Notes (Signed)
[name], female   DOB: 29-Mar-1971, 43 y.o.   MRN: [id number]    Subjective:   [name] female    DOB: 1970-08-04 43 y.o.    MRN: [id number] Health Maintenance Due: Health Maintenance Due  Topic Date Due  . Pap Smear  03/25/2013  . Lipid Panel  07/07/2013  . Urine Microalbumin  07/07/2013  . Influenza Vaccine  11/20/2013  . Hemoglobin A1c  12/25/2013  . Ophthalmology Exam  12/29/2013  . Foot Exam  01/28/2014    _________________________________________________  HPI: [name] is a 43 y.o. female here for a acute/routine visit.  Pt has a PMH outlined below.  Please see problem-based charting assessment and plan note for further details of medical issues addressed at today's visit.  PMH: Past Medical History  Diagnosis Date  . Routine/ritual circumcision   . Anemia, iron deficiency   . Glaucoma associated with ocular inflammation   . Uveitis   . Decreased visual acuity     Left eye  . Hair loss   . Hyperlipidemia   . Pap smear abnormality of cervix with LGSIL   . Type II diabetes mellitus   . Diabetes mellitus without complication   . Hypercholesteremia   . Blindness of left eye     Medications: Current Outpatient Prescriptions on File Prior to Visit  Medication Sig Dispense Refill  . ACCU-CHEK SMARTVIEW test strip CHECK BLOOD SUGAR 4 TIMES DAILY BEFORE MEAL AND BEDTIME  150 each  6  . atorvastatin (LIPITOR) 20 MG tablet Take 20 mg by mouth daily.      . dorzolamide-timolol (COSOPT) 22.3-6.8 MG/ML ophthalmic solution Place 1 drop into the left eye 2 (two) times daily.      Marland Kitchen doxycycline (VIBRAMYCIN) 100 MG capsule Take 1 capsule (100 mg total) by mouth 2 (two) times daily.  10 capsule  0  . fluconazole (DIFLUCAN) 150 MG tablet Take 1 tablet (150 mg total) by mouth daily.  7 tablet  0  . gabapentin (NEURONTIN) 100 MG capsule Take 1 capsule (100 mg total) by mouth 3 (three) times daily.  90 capsule  2  . insulin NPH Human (HUMULIN  N,NOVOLIN N) 100 UNIT/ML injection Inject 0.4 mLs (40 Units total) into the skin 2 (two) times daily before a meal. Takes 40 units in the morning,and  20 units in the evening  10 mL  12  . Insulin Syringe-Needle U-100 (RELION INSULIN SYRINGE 1ML/31G) 31G X 5/16" 1 ML MISC 1 each by Does not apply route daily.  100 each  11  . Lancets (ACCU-CHEK SOFT TOUCH) lancets CHECK BLOOD SUGAR 4 TIMES DAILY BEFORE MEAL AND BEDTIME  100 each  11  . metFORMIN (GLUCOPHAGE) 500 MG tablet Take 1 tablet (500 mg total) by mouth 2 (two) times daily with a meal.  60 tablet  11  . prednisoLONE acetate (PRED FORTE) 1 % ophthalmic suspension Place 1 drop into both eyes 2 (two) times daily.      . Skin Protectants, Misc. (EUCERIN) cream Apply topically as needed for wound care.  397 g  0   No current facility-administered medications on file prior to visit.    Allergies: No Known Allergies  FH: No family history on file.  SH: History   Social History  . Marital Status: Married    Spouse Name: N/A    Number of Children: N/A  . Years of Education: N/A   Social History Main Topics  . Smoking status: Never Smoker   .  Smokeless tobacco: None  . Alcohol Use: No  . Drug Use: No  . Sexual Activity: Not Currently   Other Topics Concern  . None   Social History Narrative   ** Merged History Encounter **       Pt. plans to sue [employer] for testing supplies [phone] Jennifer [name] July 28,2010 2:10PM      Pt is [place] Arabic.     Review of Systems: Constitutional: Negative for fever, chills and weight loss.  Eyes: Negative for blurred vision.  Respiratory: Negative for cough and shortness of breath.  Cardiovascular: Negative for chest pain, palpitations and leg swelling.  Gastrointestinal: Negative for nausea, vomiting, abdominal pain, diarrhea, constipation and blood in stool.  Genitourinary: Negative for dysuria, urgency and frequency.  Musculoskeletal: Negative for myalgias and back pain.    Neurological: Negative for dizziness, weakness and headaches.     Objective:   Vital Signs: Filed Vitals:   02/18/14 1608  BP: 129/66  Pulse: 81  Temp: 98.1 F (36.7 C)  TempSrc: Oral  Weight: 200 lb 14.4 oz (91.128 kg)  SpO2: 100%      BP Readings from Last 3 Encounters:  02/18/14 129/66  09/24/13 121/74  06/25/13 119/79    Physical Exam: Constitutional: Vital signs reviewed.  Patient is well-developed and well-nourished in NAD and cooperative with exam.  Head: Normocephalic and atraumatic. Eyes: PERRL, EOMI, conjunctivae nl, no scleral icterus.  Neck: Supple. Cardiovascular: RRR, no MRG. Pulmonary/Chest: normal effort, non-tender to palpation, CTAB, no wheezes, rales, or rhonchi. Abdominal: Soft. NT/ND +BS. Musculoskeletal: Full range ofmotion. no pain,edema,or deformity.  Nocyanosis,clubbing,oredema. Neurological: A&O x3, cranial nerves II-XII are grossly intact, moving all extremities. Extremities: 2+DP b/l; no pitting edema. Skin: Warm, dry and intact. No rash.    Assessment & Plan:   Assessment and plan was discussed and formulated with my attending.

## 2014-02-18 NOTE — Assessment & Plan Note (Signed)
-  check cbc

## 2014-02-18 NOTE — Assessment & Plan Note (Addendum)
Pt returns to clinic for a wound on her left calf area.  She was initially seen on 10/27.  Somewhat difficult due to a language barrier even with an interpretor.  On exam, there is a ~1 cm creamy white blister draining minimal serous fluid with surrounding erythema.  She was instructed to take doxycycline for 5 days and use warm compresses which she has been doing.  She reports the wound has somewhat decreased in size.  Denies systemic s/s-->no fever/chills, N/V/D.  Mild edema of the LLE.   -continue doxycycline -told pt to monitor for fever/chills, N/V  -return to clinic in 1 week for f/u -will try to get pt in to see wound care, but was instructed pt was unable to have any referrals (left a message with wound care)  -FYI (there is a picture of the wound under the media tab, taken today, 02/18/14)

## 2014-02-18 NOTE — Assessment & Plan Note (Addendum)
Pt currently on metformin 500mg  bid and novolin 40 units bid ith meals.  Last Ha1c: 11.2  Reports lowest cbg of 69.   -check HA1c, lipid panel, urine microalbumin  -increase metformin to 1000mg  in the morning and 500mg  in the evening -continue current dose of novolin  -return in 1 week for f/u of left calf wound

## 2014-02-18 NOTE — Patient Instructions (Signed)
Thank you for your visit today.   Please return to the internal medicine clinic in 1 week to recheck your wound.    Please increase your metformin to 1000mg  (2 tabs) in the morning with meals and 500mg  (1 tab) in the evening with meals.   Please continue to take your other medications as usual.   Please be sure to bring all of your medications with you to every visit; this includes herbal supplements, vitamins, eye drops, and any over-the-counter medications.   Should you have any questions regarding your medications and/or any new or worsening symptoms, please be sure to call the clinic at [phone].   If you believe that you are suffering from a life threatening condition or one that may result in the loss of limb or function, then you should call 911 or proceed to the nearest Emergency Department.

## 2014-02-19 LAB — CBC WITH DIFFERENTIAL/PLATELET
BASOS PCT: 1 % (ref 0–1)
Basophils Absolute: 0.1 10*3/uL (ref 0.0–0.1)
EOS ABS: 0.2 10*3/uL (ref 0.0–0.7)
EOS PCT: 3 % (ref 0–5)
HCT: 32.1 % — ABNORMAL LOW (ref 36.0–46.0)
Hemoglobin: 9.8 g/dL — ABNORMAL LOW (ref 12.0–15.0)
Lymphocytes Relative: 28 % (ref 12–46)
Lymphs Abs: 2.2 10*3/uL (ref 0.7–4.0)
MCH: 22.7 pg — AB (ref 26.0–34.0)
MCHC: 30.5 g/dL (ref 30.0–36.0)
MCV: 74.3 fL — ABNORMAL LOW (ref 78.0–100.0)
Monocytes Absolute: 0.5 10*3/uL (ref 0.1–1.0)
Monocytes Relative: 6 % (ref 3–12)
NEUTROS PCT: 62 % (ref 43–77)
Neutro Abs: 4.8 10*3/uL (ref 1.7–7.7)
PLATELETS: 220 10*3/uL (ref 150–400)
RBC: 4.32 MIL/uL (ref 3.87–5.11)
RDW: 15.5 % (ref 11.5–15.5)
WBC: 7.8 10*3/uL (ref 4.0–10.5)

## 2014-02-19 LAB — COMPLETE METABOLIC PANEL WITH GFR
ALBUMIN: 3.4 g/dL — AB (ref 3.5–5.2)
AST: 12 U/L (ref 0–37)
Alkaline Phosphatase: 89 U/L (ref 39–117)
BUN: 9 mg/dL (ref 6–23)
CALCIUM: 8.6 mg/dL (ref 8.4–10.5)
CHLORIDE: 104 meq/L (ref 96–112)
CO2: 22 meq/L (ref 19–32)
Creat: 0.53 mg/dL (ref 0.50–1.10)
GFR, Est Non African American: >89 mL/min
Glucose, Bld: 286 mg/dL — ABNORMAL HIGH (ref 70–99)
POTASSIUM: 4.1 meq/L (ref 3.5–5.3)
SODIUM: 135 meq/L (ref 135–145)
TOTAL PROTEIN: 6.8 g/dL (ref 6.0–8.3)
Total Bilirubin: 0.2 mg/dL (ref 0.2–1.2)

## 2014-02-19 LAB — MICROALBUMIN / CREATININE URINE RATIO
CREATININE, URINE: 57.4 mg/dL
MICROALB/CREAT RATIO: 12.2 mg/g (ref 0.0–30.0)
Microalb, Ur: 0.7 mg/dL (ref <2.0)

## 2014-02-19 LAB — LIPID PANEL
CHOLESTEROL: 151 mg/dL (ref 0–200)
HDL: 46 mg/dL (ref >39)
LDL Cholesterol: 88 mg/dL (ref 0–99)
TRIGLYCERIDES: 87 mg/dL (ref <150)
Total CHOL/HDL Ratio: 3.3 Ratio
VLDL: 17 mg/dL (ref 0–40)

## 2014-02-21 ENCOUNTER — Encounter: Payer: Self-pay | Admitting: Internal Medicine

## 2014-02-21 ENCOUNTER — Encounter: Payer: Self-pay | Admitting: *Deleted

## 2014-02-21 NOTE — Progress Notes (Unsigned)
Pt came by clinic asking for antibiotic ointment for leg wound.   Pt was seen in clinic of Friday 10/30 and has another appointment 11/6. I get the patient sterile 2 X 2's so wound will be covered.   Jackelyn Poling D will talk to pt about appointment at wound center.

## 2014-02-23 NOTE — Progress Notes (Signed)
Case discussed with Dr. Gill soon after the resident saw the patient.  We reviewed the resident's history and exam and pertinent patient test results.  I agree with the assessment, diagnosis, and plan of care documented in the resident's note. 

## 2014-02-25 ENCOUNTER — Ambulatory Visit: Payer: Medicaid Other | Admitting: Internal Medicine

## 2014-02-28 ENCOUNTER — Encounter (HOSPITAL_BASED_OUTPATIENT_CLINIC_OR_DEPARTMENT_OTHER): Payer: Medicaid Other | Attending: Plastic Surgery

## 2014-02-28 DIAGNOSIS — E1165 Type 2 diabetes mellitus with hyperglycemia: Secondary | ICD-10-CM | POA: Diagnosis not present

## 2014-02-28 DIAGNOSIS — E785 Hyperlipidemia, unspecified: Secondary | ICD-10-CM | POA: Diagnosis not present

## 2014-02-28 DIAGNOSIS — Z794 Long term (current) use of insulin: Secondary | ICD-10-CM | POA: Insufficient documentation

## 2014-02-28 DIAGNOSIS — E11622 Type 2 diabetes mellitus with other skin ulcer: Secondary | ICD-10-CM | POA: Insufficient documentation

## 2014-02-28 DIAGNOSIS — D649 Anemia, unspecified: Secondary | ICD-10-CM | POA: Insufficient documentation

## 2014-02-28 DIAGNOSIS — L97229 Non-pressure chronic ulcer of left calf with unspecified severity: Secondary | ICD-10-CM | POA: Insufficient documentation

## 2014-03-01 NOTE — Consult Note (Signed)
[name], TREAT NO.:  [id number]  MEDICAL RECORD NO.:  [id number]  LOCATION:  FOOT                         FACILITY:  [facility]  PHYSICIAN:  Irene Limbo, MD   DATE OF BIRTH:  Aug 18, 1970  DATE OF CONSULTATION:  02/28/2014 DATE OF DISCHARGE:                                CONSULTATION   CHIEF COMPLAINT:  Left calf ulceration.  HISTORY OF PRESENT ILLNESS:  The patient is a 43 year old Arabic speaking female with history of poorly controlled diabetes mellitus. She is referred from the resident clinic for evaluation of left calf wound that has been present for 2 weeks.  Review of the chart indicates the wound appeared as a white pustular area then drained.  The patient completed a course of doxycycline.  Currently, she had been placed on antibiotic ointment and presents today stating that the wound is completely dry.  She reports similar appearing areas of swelling over her bilateral axilla in the past that had healed with placement of warm compresses.  Review of the chart indicates that the patient has poorly controlled diabetes mellitus with last hemoglobin A1c measured at 11.4 and prior to that 11.2.  PAST MEDICAL HISTORY: 1. Diabetes mellitus as noted above. 2. Hyperlipidemia. 3. Poor vision over the left eye. 4. Anemia.  PAST SOCIAL HISTORY:  C-section.  The patient has never been a smoker.  PHYSICAL EXAMINATION:  Blood pressure is 141/95, pulse is 80, temperature is 98.4.  Height is 5 feet 5 inches, weight is 209 pounds. Blood glucose is reported at 126.  She has absent hair over her distal extremities.  There is no edema.  She has a palpable dorsalis pedis pulse bilaterally.  She has adherent scab over the left medial calf. Following removal of this, she has completely epithelialized.  ASSESSMENT:  The patient has healed her wound given the history this is likely a soft tissue abscess that she is healed.  Counseled the patient that similar to  her axillary wounds, she will continue to develop these until her blood glucose is in better control.  Otherwise, we will plan to discharge her from clinic.          ______________________________ Irene Limbo, MD MBA    BT/MEDQ  D:  02/28/2014  T:  03/01/2014  Job:  [id number]

## 2014-03-02 ENCOUNTER — Ambulatory Visit: Payer: Medicaid Other | Admitting: Internal Medicine

## 2014-03-07 ENCOUNTER — Ambulatory Visit: Payer: Medicaid Other | Admitting: Pulmonary Disease

## 2014-03-15 ENCOUNTER — Ambulatory Visit: Payer: Medicaid Other | Admitting: *Deleted

## 2014-03-15 ENCOUNTER — Ambulatory Visit (INDEPENDENT_AMBULATORY_CARE_PROVIDER_SITE_OTHER): Payer: Medicaid Other | Admitting: Pulmonary Disease

## 2014-03-15 VITALS — BP 114/67 | HR 84 | Temp 98.2°F | Wt 196.1 lb

## 2014-03-15 DIAGNOSIS — Z23 Encounter for immunization: Secondary | ICD-10-CM

## 2014-03-15 DIAGNOSIS — E11321 Type 2 diabetes mellitus with mild nonproliferative diabetic retinopathy with macular edema: Secondary | ICD-10-CM

## 2014-03-15 DIAGNOSIS — S81802D Unspecified open wound, left lower leg, subsequent encounter: Secondary | ICD-10-CM

## 2014-03-15 DIAGNOSIS — E113219 Type 2 diabetes mellitus with mild nonproliferative diabetic retinopathy with macular edema, unspecified eye: Secondary | ICD-10-CM

## 2014-03-15 DIAGNOSIS — Z0000 Encounter for general adult medical examination without abnormal findings: Secondary | ICD-10-CM

## 2014-03-15 MED ORDER — INSULIN PEN NEEDLE 32G X 4 MM MISC: Status: DC

## 2014-03-15 MED ORDER — INSULIN ISOPHANE HUMAN 100 UNIT/ML KWIKPEN: 40.0000 [IU] | PEN_INJECTOR | Freq: Two times a day (BID) | SUBCUTANEOUS | Status: DC

## 2014-03-15 NOTE — Assessment & Plan Note (Addendum)
Lab Results  Component Value Date   HGBA1C 11.4 02/18/2014   HGBA1C 11.2 09/24/2013   HGBA1C 11.4 05/06/2013     Assessment: Diabetes control: poor control (HgbA1C >9%) Progress toward A1C goal:  unchanged  Plan: Medications:  Continue Insulin NPH 40u in AM and 20u in PM, metformin 500mg  BID Home glucose monitoring: Frequency: 3 times a day Timing: before meals Other plans:   -Eye exam done today.Will request results. -Humulin N changed from vials to [place]. -Follow up in 1-2 months for reassessment of DM control

## 2014-03-15 NOTE — Assessment & Plan Note (Signed)
Influenza vaccine administered today.

## 2014-03-15 NOTE — Progress Notes (Signed)
Subjective:   [name], female    DOB: 1970-07-24, 43 y.o.   MRN: [id number]  HPI [name] is a 43 year old woman with history of iron def anemia, HLD, DM2 presenting for follow up of left calf wound.  She was last seen in clinic 02/18/2014. She was noted to have a left calf wound initially seen 10/27 that had been improving on doxycycline. The wound at that time was about 1 cm creamy white blister draining minimal serous fluid with surrounding erythema.  Today she notes that it has improved greatly. She finished her course of antibiotics. Denies erythema, drainage or pain.  DM2: she would like her Humalin N switched from vials to pens.  Review of Systems  Constitutional: Negative for fever and chills.  Respiratory: Negative for shortness of breath.   Cardiovascular: Negative for chest pain.  Gastrointestinal: Negative for nausea, vomiting, abdominal pain and diarrhea.  Genitourinary: Negative for dysuria.   Past Medical History  Diagnosis Date  . Routine/ritual circumcision   . Anemia, iron deficiency   . Glaucoma associated with ocular inflammation   . Uveitis   . Decreased visual acuity     Left eye  . Hair loss   . Hyperlipidemia   . Pap smear abnormality of cervix with LGSIL   . Type II diabetes mellitus   . Diabetes mellitus without complication   . Hypercholesteremia   . Blindness of left eye    Current Outpatient Prescriptions on File Prior to Visit  Medication Sig Dispense Refill  . ACCU-CHEK SMARTVIEW test strip CHECK BLOOD SUGAR 4 TIMES DAILY BEFORE MEAL AND BEDTIME 150 each 6  . atorvastatin (LIPITOR) 20 MG tablet Take 20 mg by mouth daily.    . dorzolamide-timolol (COSOPT) 22.3-6.8 MG/ML ophthalmic solution Place 1 drop into the left eye 2 (two) times daily.    Marland Kitchen doxycycline (VIBRAMYCIN) 100 MG capsule Take 1 capsule (100 mg total) by mouth 2 (two) times daily. 10 capsule 0  . fluconazole (DIFLUCAN) 150 MG tablet Take 1 tablet (150 mg  total) by mouth daily. 7 tablet 0  . gabapentin (NEURONTIN) 100 MG capsule Take 1 capsule (100 mg total) by mouth 3 (three) times daily. 90 capsule 2  . insulin NPH Human (HUMULIN N,NOVOLIN N) 100 UNIT/ML injection Inject 0.4 mLs (40 Units total) into the skin 2 (two) times daily before a meal. Takes 40 units in the morning,and  20 units in the evening 10 mL 12  . Insulin Syringe-Needle U-100 (RELION INSULIN SYRINGE 1ML/31G) 31G X 5/16" 1 ML MISC 1 each by Does not apply route daily. 100 each 11  . Lancets (ACCU-CHEK SOFT TOUCH) lancets CHECK BLOOD SUGAR 4 TIMES DAILY BEFORE MEAL AND BEDTIME 100 each 11  . metFORMIN (GLUCOPHAGE) 500 MG tablet Take 1 tablet (500 mg total) by mouth 2 (two) times daily with a meal. 60 tablet 11  . prednisoLONE acetate (PRED FORTE) 1 % ophthalmic suspension Place 1 drop into both eyes 2 (two) times daily.    . Skin Protectants, Misc. (EUCERIN) cream Apply topically as needed for wound care. 397 g 0   No current facility-administered medications on file prior to visit.   Today's Vitals   03/15/14 1526  BP: 114/67  Pulse: 84  Temp: 98.2 F (36.8 C)  TempSrc: Oral  Weight: 196 lb 1.6 oz (88.95 kg)  SpO2: 100%   Objective:  Physical Exam  Constitutional: She is oriented to person, place, and time. She appears well-developed and  well-nourished. No distress.  HENT:  Head: Normocephalic and atraumatic.  Eyes: EOM are normal.  Neck: Neck supple.  Cardiovascular: Normal rate and regular rhythm.   No murmur heard. Pulmonary/Chest: Breath sounds normal. She has no wheezes.  Abdominal: Soft. She exhibits no distension. There is no tenderness.  Musculoskeletal: Normal range of motion. She exhibits no edema.  Neurological: She is alert and oriented to person, place, and time.  Skin: Skin is warm and dry. No erythema.        Assessment & Plan:  Please refer to problem based charting.

## 2014-03-15 NOTE — Assessment & Plan Note (Signed)
Assessment: Healing well  Plan: No further local wound care or antibiotics required.

## 2014-03-15 NOTE — Patient Instructions (Addendum)
Please follow up with your primary care physician in 1-2 months.  General Instructions:   Please bring your medicines with you each time you come to clinic.  Medicines may include prescription medications, over-the-counter medications, herbal remedies, eye drops, vitamins, or other pills.   Progress Toward Treatment Goals:  Treatment Goal 03/15/2014  Hemoglobin A1C unchanged    Self Care Goals & Plans:  Self Care Goal 03/15/2014  Manage my medications take my medicines as prescribed; bring my medications to every visit  Monitor my health keep track of my blood glucose; bring my glucose meter and log to each visit  Eat healthy foods drink diet soda or water instead of juice or soda; eat baked foods instead of fried foods  Be physically active find an activity I enjoy    Home Blood Glucose Monitoring 03/15/2014  Check my blood sugar 3 times a day  When to check my blood sugar before meals

## 2014-03-16 NOTE — Progress Notes (Signed)
Internal Medicine Clinic Attending  I saw and evaluated the patient.  I personally confirmed the key portions of the history and exam documented by Dr. Krall and I reviewed pertinent patient test results.  The assessment, diagnosis, and plan were formulated together and I agree with the documentation in the resident's note.  

## 2014-04-13 ENCOUNTER — Ambulatory Visit (INDEPENDENT_AMBULATORY_CARE_PROVIDER_SITE_OTHER): Payer: Medicaid Other | Admitting: Internal Medicine

## 2014-04-13 VITALS — BP 109/79 | HR 89 | Temp 98.1°F | Resp 20 | Ht 64.0 in | Wt 194.8 lb

## 2014-04-13 DIAGNOSIS — E1165 Type 2 diabetes mellitus with hyperglycemia: Secondary | ICD-10-CM

## 2014-04-13 DIAGNOSIS — E11321 Type 2 diabetes mellitus with mild nonproliferative diabetic retinopathy with macular edema: Secondary | ICD-10-CM

## 2014-04-13 DIAGNOSIS — E113219 Type 2 diabetes mellitus with mild nonproliferative diabetic retinopathy with macular edema, unspecified eye: Secondary | ICD-10-CM

## 2014-04-13 DIAGNOSIS — Z794 Long term (current) use of insulin: Secondary | ICD-10-CM

## 2014-04-13 LAB — GLUCOSE, CAPILLARY: GLUCOSE-CAPILLARY: 490 mg/dL — AB (ref 70–99)

## 2014-04-13 MED ORDER — CLOTRIMAZOLE-BETAMETHASONE 1-0.05 % EX CREA: 1.0000 "application " | TOPICAL_CREAM | Freq: Two times a day (BID) | CUTANEOUS | Status: DC

## 2014-04-13 MED ORDER — METFORMIN HCL 1000 MG PO TABS: 1000.0000 mg | ORAL_TABLET | Freq: Two times a day (BID) | ORAL | Status: DC

## 2014-04-13 MED ORDER — INSULIN ISOPHANE HUMAN 100 UNIT/ML KWIKPEN: PEN_INJECTOR | SUBCUTANEOUS | Status: DC

## 2014-04-13 NOTE — Patient Instructions (Signed)
General Instructions: For your diabetes:  1. Increase your morning insulin to 60 units 2. Increase your night insulin to 40 units 3. Take the metformin 1000mg  twice a day  Thank you for bringing your medicines today. This helps [place] keep you safe from mistakes.   Progress Toward Treatment Goals:  Treatment Goal 03/15/2014  Hemoglobin A1C unchanged    Self Care Goals & Plans:  Self Care Goal 04/13/2014  Manage my medications take my medicines as prescribed; bring my medications to every visit; refill my medications on time  Monitor my health bring my glucose meter and log to each visit  Eat healthy foods eat more vegetables; eat baked foods instead of fried foods  Be physically active take a walk every day    Home Blood Glucose Monitoring 03/15/2014  Check my blood sugar 3 times a day  When to check my blood sugar before meals     Care Management & Community Referrals:  Referral 01/28/2013  Referrals made for care management support none needed

## 2014-04-13 NOTE — Progress Notes (Signed)
Subjective:   [name] female   DOB: 1970/10/09 43 y.o.   MRN: [id number]  HPI: [name] is a 43 y.o. [place] woman pmh as listed below presents for DM recheck.   Diabetic Review of Systems - medication compliance: compliant some of the time at best, diabetic diet compliance: noncompliant much of the time, home glucose monitoring: is performed sporadically.  No Other symptoms and concerns.  DM - Patient checking blood sugars 1-2 times daily, before breakfast and after dinner. Currently taking Insulin NPH 40u in AM only and metformin 500mg  in the AM sporadically.  No hypoglycemic episodes since last visit. Meter report reviewed with patient and pt average 314, highest 494 and lowest 103.  has polyuria, polydipsia, nausea, vomiting, diarrhea.  does not request refills today.   Past Medical History  Diagnosis Date  . Routine/ritual circumcision   . Anemia, iron deficiency   . Glaucoma associated with ocular inflammation   . Uveitis   . Decreased visual acuity     Left eye  . Hair loss   . Hyperlipidemia   . Pap smear abnormality of cervix with LGSIL   . Type II diabetes mellitus   . Diabetes mellitus without complication   . Hypercholesteremia   . Blindness of left eye    Current Outpatient Prescriptions  Medication Sig Dispense Refill  . ACCU-CHEK SMARTVIEW test strip CHECK BLOOD SUGAR 4 TIMES DAILY BEFORE MEAL AND BEDTIME 150 each 6  . atorvastatin (LIPITOR) 20 MG tablet Take 20 mg by mouth daily.    . dorzolamide-timolol (COSOPT) 22.3-6.8 MG/ML ophthalmic solution Place 1 drop into the left eye 2 (two) times daily.    Marland Kitchen doxycycline (VIBRAMYCIN) 100 MG capsule Take 1 capsule (100 mg total) by mouth 2 (two) times daily. 10 capsule 0  . fluconazole (DIFLUCAN) 150 MG tablet Take 1 tablet (150 mg total) by mouth daily. 7 tablet 0  . gabapentin (NEURONTIN) 100 MG capsule Take 1 capsule (100 mg total) by mouth 3 (three) times daily. 90 capsule 2  . Insulin  NPH, Human,, Isophane, (HUMULIN N KWIKPEN) 100 UNIT/ML Kiwkpen Inject 40 Units into the skin 2 (two) times daily before a meal. Takes 40 units in the morning,and 20 units in the evening. 15 mL 6  . Insulin Pen Needle 32G X 4 MM MISC Use to give insulin twice a day. 100 each 2  . Insulin Syringe-Needle U-100 (RELION INSULIN SYRINGE 1ML/31G) 31G X 5/16" 1 ML MISC 1 each by Does not apply route daily. 100 each 11  . Lancets (ACCU-CHEK SOFT TOUCH) lancets CHECK BLOOD SUGAR 4 TIMES DAILY BEFORE MEAL AND BEDTIME 100 each 11  . metFORMIN (GLUCOPHAGE) 500 MG tablet Take 1 tablet (500 mg total) by mouth 2 (two) times daily with a meal. 60 tablet 11  . prednisoLONE acetate (PRED FORTE) 1 % ophthalmic suspension Place 1 drop into both eyes 2 (two) times daily.    . Skin Protectants, Misc. (EUCERIN) cream Apply topically as needed for wound care. 397 g 0   No current facility-administered medications for this visit.   No family history on file. History   Social History  . Marital Status: Married    Spouse Name: N/A    Number of Children: N/A  . Years of Education: N/A   Social History Main Topics  . Smoking status: Never Smoker   . Smokeless tobacco: Not on file  . Alcohol Use: No  . Drug Use: No  . Sexual Activity:  Not Currently   Other Topics Concern  . Not on file   Social History Narrative   ** Merged History Encounter **       Pt. plans to sue [employer] for testing supplies [phone] [name] July 28,2010 2:10PM      Pt is [place].    Review of Systems: Pertinent items are noted in HPI. Objective:  Physical Exam: Filed Vitals:   04/13/14 1605  BP: 109/79  Pulse: 89  Temp: 98.1 F (36.7 C)  TempSrc: Oral  Resp: 20  Height: 5\' 4"  (1.626 m)  Weight: 194 lb 12.8 oz (88.361 kg)  SpO2: 100%   General: sitting in chair, NAD  HEENT: PERRL, EOMI, no scleral icterus Cardiac: RRR, no rubs, murmurs or gallops Pulm: clear to auscultation bilaterally, moving normal  volumes of air Abd: soft, nontender, nondistended, BS present Ext: warm and well perfused, no pedal edema, leg wound well healed, no other ulcerations, DP 2+ pulses bilaterally Neuro: alert and oriented X3, cranial nerves II-XII grossly intact  Assessment & Plan:  Please see problem oriented charting  Pt discussed with Dr. Eppie Gibson

## 2014-04-13 NOTE — Assessment & Plan Note (Addendum)
Lab Results  Component Value Date   HGBA1C 11.4 02/18/2014   HGBA1C 11.2 09/24/2013   HGBA1C 11.4 05/06/2013     Assessment: Diabetes control:   Progress toward A1C goal:    Comments: very poor control   Plan: Medications:  Increase insulin to NPH to 60 units in AM and 40 units in PM and increase metformin to 1000mg  BID Home glucose monitoring: Frequency:   Timing:   Instruction/counseling given: reminded to bring blood glucose meter & log to each visit, reminded to bring medications to each visit and discussed foot care Educational resources provided:   Self management tools provided:   Other plans: follow up in 1-2 months for titration, pt received foot exam today

## 2014-04-14 NOTE — Progress Notes (Signed)
Case discussed with Dr. Sadek soon after the resident saw the patient.  We reviewed the resident's history and exam and pertinent patient test results.  I agree with the assessment, diagnosis, and plan of care documented in the resident's note. 

## 2014-04-19 LAB — HM DIABETES EYE EXAM

## 2014-05-18 ENCOUNTER — Encounter: Payer: Self-pay | Admitting: Internal Medicine

## 2014-05-21 ENCOUNTER — Encounter (HOSPITAL_COMMUNITY): Payer: Self-pay | Admitting: Emergency Medicine

## 2014-05-21 ENCOUNTER — Emergency Department (HOSPITAL_COMMUNITY)
Admission: EM | Admit: 2014-05-21 | Discharge: 2014-05-21 | Disposition: A | Discharge status: Other Acute Inpatient Hospital | Payer: Medicaid Other | Attending: Emergency Medicine | Admitting: Emergency Medicine

## 2014-05-21 DIAGNOSIS — Z79899 Other long term (current) drug therapy: Secondary | ICD-10-CM | POA: Insufficient documentation

## 2014-05-21 DIAGNOSIS — H409 Unspecified glaucoma: Secondary | ICD-10-CM

## 2014-05-21 DIAGNOSIS — H40212 Acute angle-closure glaucoma, left eye: Secondary | ICD-10-CM | POA: Diagnosis not present

## 2014-05-21 DIAGNOSIS — Z872 Personal history of diseases of the skin and subcutaneous tissue: Secondary | ICD-10-CM | POA: Diagnosis not present

## 2014-05-21 DIAGNOSIS — Z7952 Long term (current) use of systemic steroids: Secondary | ICD-10-CM | POA: Diagnosis not present

## 2014-05-21 DIAGNOSIS — Z794 Long term (current) use of insulin: Secondary | ICD-10-CM | POA: Insufficient documentation

## 2014-05-21 DIAGNOSIS — H5442 Blindness, left eye, normal vision right eye: Secondary | ICD-10-CM | POA: Insufficient documentation

## 2014-05-21 DIAGNOSIS — Z862 Personal history of diseases of the blood and blood-forming organs and certain disorders involving the immune mechanism: Secondary | ICD-10-CM | POA: Diagnosis not present

## 2014-05-21 DIAGNOSIS — E119 Type 2 diabetes mellitus without complications: Secondary | ICD-10-CM | POA: Diagnosis not present

## 2014-05-21 DIAGNOSIS — E785 Hyperlipidemia, unspecified: Secondary | ICD-10-CM | POA: Diagnosis not present

## 2014-05-21 DIAGNOSIS — H5712 Ocular pain, left eye: Secondary | ICD-10-CM | POA: Diagnosis present

## 2014-05-21 LAB — I-STAT CHEM 8, ED
BUN: 9 mg/dL (ref 6–23)
CALCIUM ION: 1.2 mmol/L (ref 1.12–1.23)
Chloride: 100 mmol/L (ref 96–112)
Creatinine, Ser: 0.5 mg/dL (ref 0.50–1.10)
GLUCOSE: 277 mg/dL — AB (ref 70–99)
HCT: 36 % (ref 36.0–46.0)
HEMOGLOBIN: 12.2 g/dL (ref 12.0–15.0)
Potassium: 4 mmol/L (ref 3.5–5.1)
Sodium: 135 mmol/L (ref 135–145)
TCO2: 21 mmol/L (ref 0–100)

## 2014-05-21 LAB — CBC WITH DIFFERENTIAL/PLATELET
Basophils Absolute: 0 10*3/uL (ref 0.0–0.1)
Basophils Relative: 1 % (ref 0–1)
EOS PCT: 7 % — AB (ref 0–5)
Eosinophils Absolute: 0.4 10*3/uL (ref 0.0–0.7)
HEMATOCRIT: 33.5 % — AB (ref 36.0–46.0)
Hemoglobin: 9.9 g/dL — ABNORMAL LOW (ref 12.0–15.0)
Lymphocytes Relative: 31 % (ref 12–46)
Lymphs Abs: 2 10*3/uL (ref 0.7–4.0)
MCH: 21.8 pg — AB (ref 26.0–34.0)
MCHC: 29.6 g/dL — AB (ref 30.0–36.0)
MCV: 73.8 fL — ABNORMAL LOW (ref 78.0–100.0)
MONO ABS: 0.3 10*3/uL (ref 0.1–1.0)
Monocytes Relative: 5 % (ref 3–12)
Neutro Abs: 3.7 10*3/uL (ref 1.7–7.7)
Neutrophils Relative %: 58 % (ref 43–77)
Platelets: 174 10*3/uL (ref 150–400)
RBC: 4.54 MIL/uL (ref 3.87–5.11)
RDW: 15.3 % (ref 11.5–15.5)
WBC: 6.5 10*3/uL (ref 4.0–10.5)

## 2014-05-21 LAB — CBG MONITORING, ED: Glucose-Capillary: 255 mg/dL — ABNORMAL HIGH (ref 70–99)

## 2014-05-21 MED ORDER — TETRACAINE HCL 0.5 % OP SOLN
2.0000 [drp] | Freq: Once | OPHTHALMIC | Status: AC
  Administered 2014-05-21: 2 [drp] via OPHTHALMIC
  Filled 2014-05-21: qty 2

## 2014-05-21 MED ORDER — FLUORESCEIN SODIUM 1 MG OP STRP
1.0000 | ORAL_STRIP | Freq: Once | OPHTHALMIC | Status: AC
  Administered 2014-05-21: 1 via OPHTHALMIC
  Filled 2014-05-21: qty 1

## 2014-05-21 MED ORDER — HYDROCODONE-ACETAMINOPHEN 5-325 MG PO TABS
2.0000 | ORAL_TABLET | Freq: Once | ORAL | Status: AC
  Administered 2014-05-21: 2 via ORAL
  Filled 2014-05-21: qty 2

## 2014-05-21 MED ORDER — ACETAZOLAMIDE SODIUM 500 MG IJ SOLR
500.0000 mg | Freq: Once | INTRAMUSCULAR | Status: AC
  Administered 2014-05-21: 500 mg via INTRAVENOUS
  Filled 2014-05-21: qty 500

## 2014-05-21 NOTE — ED Provider Notes (Signed)
CSN: [id number]     Arrival date & time 05/21/14  0220 History  This chart was scribe for Everlene Balls, MD by Judithann Sauger, ED Scribe. The patient was seen in room B18C/B18C and the patient's care was started at 3:08 AM.    Chief Complaint  Patient presents with  . Eye Pain   The history is provided by the patient. The history is limited by a language barrier. A language interpreter was used.   HPI Comments: Jennifer [name] is a 44 y.o. female with hx of DM and glaucoma associated with ocular inflammation who presents to the Emergency Department complaining of left eye pain onset yesterday. He denies any injury to the eye. She reports that she has been using eye drops for the past 3 months. She states that she had the eye problem intermittently for 6 years. She reports that the pain became worse tonight. She states that she has been unable to open her eyes as it gets very teary. She adds that she cannot normally see out of the left eye. She states that her eye pressure has been 52-57.She has an associated L side headache as well.  Light makes her pain worse.    Past Medical History  Diagnosis Date  . Routine/ritual circumcision   . Anemia, iron deficiency   . Glaucoma associated with ocular inflammation   . Uveitis   . Decreased visual acuity     Left eye  . Hair loss   . Hyperlipidemia   . Pap smear abnormality of cervix with LGSIL   . Type II diabetes mellitus   . Diabetes mellitus without complication   . Hypercholesteremia   . Blindness of left eye    Past Surgical History  Procedure Laterality Date  . Cesarean section     No family history on file. History  Substance Use Topics  . Smoking status: Never Smoker   . Smokeless tobacco: Not on file  . Alcohol Use: No   OB History    Gravida Para Term Preterm AB TAB SAB Ectopic Multiple Living   7 4 4  3  3   4      Review of Systems  Constitutional: Negative for fever.  Eyes: Positive for pain and discharge.  All other  systems reviewed and are negative.     Allergies  Review of patient's allergies indicates no known allergies.  Home Medications   Prior to Admission medications   Medication Sig Start Date End Date Taking? Authorizing Provider  ACCU-CHEK SMARTVIEW test strip CHECK BLOOD SUGAR 4 TIMES DAILY BEFORE MEAL AND BEDTIME 02/15/14   Karren Cobble, MD  atorvastatin (LIPITOR) 20 MG tablet Take 20 mg by mouth daily.    Historical Provider, MD  clotrimazole-betamethasone (LOTRISONE) cream Apply 1 application topically 2 (two) times daily. 04/13/14   Clinton Gallant, MD  dorzolamide-timolol (COSOPT) 22.3-6.8 MG/ML ophthalmic solution Place 1 drop into the left eye 2 (two) times daily.    Historical Provider, MD  gabapentin (NEURONTIN) 100 MG capsule Take 1 capsule (100 mg total) by mouth 3 (three) times daily. 05/13/13 05/13/14  Clinton Gallant, MD  Insulin NPH, Human,, Isophane, (HUMULIN N KWIKPEN) 100 UNIT/ML Kiwkpen Takes 60 units in the morning,and 40 units in the evening. 04/13/14   Clinton Gallant, MD  Insulin Pen Needle 32G X 4 MM MISC Use to give insulin twice a day. 03/15/14   Jacques Earthly, MD  Insulin Syringe-Needle U-100 (RELION INSULIN SYRINGE 1ML/31G) 31G X 5/16" 1 ML MISC 1  each by Does not apply route daily. 06/25/13   Clinton Gallant, MD  Lancets (ACCU-CHEK SOFT TOUCH) lancets CHECK BLOOD SUGAR 4 TIMES DAILY BEFORE MEAL AND BEDTIME 02/15/14   Karren Cobble, MD  metFORMIN (GLUCOPHAGE) 1000 MG tablet Take 1 tablet (1,000 mg total) by mouth 2 (two) times daily with a meal. 04/13/14 04/13/15  Clinton Gallant, MD  prednisoLONE acetate (PRED FORTE) 1 % ophthalmic suspension Place 1 drop into both eyes 2 (two) times daily.    Historical Provider, MD  Skin Protectants, Misc. (EUCERIN) cream Apply topically as needed for wound care. 05/06/13   Clinton Gallant, MD   BP 165/79 mmHg  Pulse 89  Temp(Src) 97.8 F (36.6 C)  Resp 18  Ht 5\' 4"  (1.626 m)  Wt 180 lb (81.647 kg)  BMI 30.88 kg/m2  SpO2 99% Physical Exam   Constitutional: She is oriented to person, place, and time. She appears well-developed and well-nourished. No distress.  HENT:  Head: Normocephalic and atraumatic.  Nose: Nose normal.  Mouth/Throat: Oropharynx is clear and moist. No oropharyngeal exudate.  Eyes: Conjunctivae and EOM are normal. Pupils are equal, round, and reactive to light. No scleral icterus.  Left eye only has light perception . Cloudy cornea. Cannot visualize pupil. Injected conjunctiva with tearing. Visual acuity cannot be obtained due to language barrier.    Neck: Normal range of motion. Neck supple. No JVD present. No tracheal deviation present. No thyromegaly present.  Cardiovascular: Normal rate, regular rhythm and normal heart sounds.  Exam reveals no gallop and no friction rub.   No murmur heard. Pulmonary/Chest: Effort normal and breath sounds normal. No respiratory distress. She has no wheezes. She exhibits no tenderness.  Abdominal: Soft. Bowel sounds are normal. She exhibits no distension and no mass. There is no tenderness. There is no rebound and no guarding.  Musculoskeletal: Normal range of motion. She exhibits no edema or tenderness.  Lymphadenopathy:    She has no cervical adenopathy.  Neurological: She is alert and oriented to person, place, and time. No cranial nerve deficit. She exhibits normal muscle tone.  Skin: Skin is warm and dry. No rash noted. No erythema. No pallor.  Nursing note and vitals reviewed.   ED Course  Procedures (including critical care time) DIAGNOSTIC STUDIES: Oxygen Saturation is 99% on RA, normal by my interpretation.    COORDINATION OF CARE: 3:20 AM- Pt advised of plan for treatment and pt agrees.    Labs Review Labs Reviewed  CBC WITH DIFFERENTIAL/PLATELET - Abnormal; Notable for the following:    Hemoglobin 9.9 (*)    HCT 33.5 (*)    MCV 73.8 (*)    MCH 21.8 (*)    MCHC 29.6 (*)    Eosinophils Relative 7 (*)    All other components within normal limits  CBG  MONITORING, ED - Abnormal; Notable for the following:    Glucose-Capillary 255 (*)    All other components within normal limits  I-STAT CHEM 8, ED - Abnormal; Notable for the following:    Glucose, Bld 277 (*)    All other components within normal limits  CBG MONITORING, ED    Imaging Review No results found.   EKG Interpretation None      MDM   Final diagnoses:  None   Patient presents to the ED for worsening L eye pain in the setting of glaucoma.  Pressures in the ED are in the low 70s, her baseline is low 50s.  I spoke with our ophothalmologist  who suggests to give the pt. IV diamox as well as her home meds, bromocriptine, timolol, and prednisolone and to transfer the patient to [facility] for ultimate treatment.  Dr. Jhonnie Garner with [facility] accepted the patient for ED transfer.    I personally performed the services described in this documentation, which was scribed in my presence. The recorded information has been reviewed and is accurate.   69: Transportation is arrived to take the patient to [facility]. Her vital signs were within her normal limits and she is safe for the transport.  Everlene Balls, MD 05/21/14 [phone]

## 2014-05-21 NOTE — ED Notes (Signed)
[facility] called for transport of patient to [facility], was informed it will be about an hour until they can get here. Pt and family updated.

## 2014-05-21 NOTE — ED Notes (Signed)
Diabetic pt with c/o L eye pain since Friday morning.  Family member reports pt using eye gtts for same without relief.  Reports history of same x 6 yrs but states pain is worse than usual.

## 2014-05-21 NOTE — ED Notes (Signed)
Pt given home timolol, prednisolone, and bromocriptine drops. Given 1 drop in Left eye, one time each. Dr. Claudine Mouton at bedside.

## 2014-05-27 ENCOUNTER — Ambulatory Visit (INDEPENDENT_AMBULATORY_CARE_PROVIDER_SITE_OTHER): Payer: Medicaid Other | Admitting: Internal Medicine

## 2014-05-27 ENCOUNTER — Encounter: Payer: Self-pay | Admitting: Internal Medicine

## 2014-05-27 VITALS — BP 133/74 | HR 80 | Temp 98.2°F | Ht 66.0 in | Wt 195.3 lb

## 2014-05-27 DIAGNOSIS — E11321 Type 2 diabetes mellitus with mild nonproliferative diabetic retinopathy with macular edema: Secondary | ICD-10-CM

## 2014-05-27 DIAGNOSIS — E113219 Type 2 diabetes mellitus with mild nonproliferative diabetic retinopathy with macular edema, unspecified eye: Secondary | ICD-10-CM

## 2014-05-27 LAB — GLUCOSE, CAPILLARY: Glucose-Capillary: 314 mg/dL — ABNORMAL HIGH (ref 70–99)

## 2014-05-27 LAB — POCT GLYCOSYLATED HEMOGLOBIN (HGB A1C): HEMOGLOBIN A1C: 12

## 2014-05-27 NOTE — Assessment & Plan Note (Signed)
Lab Results  Component Value Date   HGBA1C 12.0 05/27/2014   HGBA1C 11.4 02/18/2014   HGBA1C 11.2 09/24/2013     Assessment: Diabetes control:   Progress toward A1C goal:    Comments: worse prognosis  Likely secondary to the patient changing medications.  Plan: Medications:  Will continue as previously prescribed: metformin 1000 mg BID and NPH 60 units AM and 40 units PM Home glucose monitoring: Frequency:   Timing:   Instruction/counseling given: reminded to bring blood glucose meter & log to each visit, reminded to bring medications to each visit and discussed diet Educational resources provided: brochure (denies) Self management tools provided:   Other plans: f/u in 1 month for compliance

## 2014-05-27 NOTE — Progress Notes (Signed)
Medicine attending: I personally interviewed and briefly examined this patient and reviewed pertinent laboratory and radiographic data together with resident physician Dr Clinton Gallant and I concur with her evaluation and management plan.

## 2014-05-27 NOTE — Patient Instructions (Signed)
General Instructions:   Thank you for bringing your medicines today. This helps [place] keep you safe from mistakes.  FOr your Diabetes please take the metformin 500mg  BID as prescribed and we will see you back in 1 month to see how you are doing.   Progress Toward Treatment Goals:  Treatment Goal 03/15/2014  Hemoglobin A1C unchanged    Self Care Goals & Plans:  Self Care Goal 05/27/2014  Manage my medications take my medicines as prescribed; bring my medications to every visit; refill my medications on time  Monitor my health keep track of my blood glucose; bring my glucose meter and log to each visit  Eat healthy foods drink diet soda or water instead of juice or soda; eat more vegetables; eat foods that are low in salt; eat baked foods instead of fried foods; eat fruit for snacks and desserts  Be physically active -    Home Blood Glucose Monitoring 03/15/2014  Check my blood sugar 3 times a day  When to check my blood sugar before meals     Care Management & Community Referrals:  Referral 01/28/2013  Referrals made for care management support none needed

## 2014-05-27 NOTE — Progress Notes (Signed)
Subjective:   [name] female   DOB: [birth year] 44 y.o.   MRN: [id number]  HPI: [name] is a 44 y.o. woman pmh as listed below presents for DM recheck.   DM - Patient checking blood sugars 2 times daily, before breakfast and dinner. Currently taking insulin 50 in AM and 40 in PM along with metformin 500mg  daily.  The patient states that ever since she's been taking metformin 500 mg twice a day she has readings in the 100s which makes her feel weak or tired and therefore she didn't enjoy the sensation and stopped taking one of the pills. no hypoglycemic episodes since last visit. denies polyuria, polydipsia, nausea, vomiting, diarrhea.  does not request refills today.  The patient is very tearful and upset during this visit given the news that she lost her brother and nephew during the refugee crisis in [place]. They both escaped by boat through the [employer] where they sank and another relative was shot at a university who left 2 small children behind.   Past Medical History  Diagnosis Date  . Routine/ritual circumcision   . Anemia, iron deficiency   . Glaucoma associated with ocular inflammation   . Uveitis   . Decreased visual acuity     Left eye  . Hair loss   . Hyperlipidemia   . Pap smear abnormality of cervix with LGSIL   . Type II diabetes mellitus   . Diabetes mellitus without complication   . Hypercholesteremia   . Blindness of left eye    Current Outpatient Prescriptions  Medication Sig Dispense Refill  . ACCU-CHEK SMARTVIEW test strip CHECK BLOOD SUGAR 4 TIMES DAILY BEFORE MEAL AND BEDTIME 150 each 6  . atorvastatin (LIPITOR) 20 MG tablet Take 20 mg by mouth daily.    . brimonidine (ALPHAGAN P) 0.1 % SOLN Place 1 drop into the left eye every 8 (eight) hours.    . brimonidine-timolol (COMBIGAN) 0.2-0.5 % ophthalmic solution Place 1 drop into the left eye every 12 (twelve) hours.    . clotrimazole-betamethasone (LOTRISONE) cream Apply 1 application  topically 2 (two) times daily. (Patient not taking: Reported on 05/21/2014) 30 g 0  . dorzolamide-timolol (COSOPT) 22.3-6.8 MG/ML ophthalmic solution Place 1 drop into the left eye 2 (two) times daily.    Marland Kitchen gabapentin (NEURONTIN) 100 MG capsule Take 1 capsule (100 mg total) by mouth 3 (three) times daily. (Patient not taking: Reported on 05/21/2014) 90 capsule 2  . Insulin NPH, Human,, Isophane, (HUMULIN N KWIKPEN) 100 UNIT/ML Kiwkpen Takes 60 units in the morning,and 40 units in the evening. 15 mL 6  . Insulin Pen Needle 32G X 4 MM MISC Use to give insulin twice a day. 100 each 2  . Insulin Syringe-Needle U-100 (RELION INSULIN SYRINGE 1ML/31G) 31G X 5/16" 1 ML MISC 1 each by Does not apply route daily. 100 each 11  . Lancets (ACCU-CHEK SOFT TOUCH) lancets CHECK BLOOD SUGAR 4 TIMES DAILY BEFORE MEAL AND BEDTIME 100 each 11  . metFORMIN (GLUCOPHAGE) 1000 MG tablet Take 1 tablet (1,000 mg total) by mouth 2 (two) times daily with a meal. (Patient not taking: Reported on 05/21/2014) 60 tablet 11  . prednisoLONE acetate (PRED FORTE) 1 % ophthalmic suspension Place 1 drop into the right eye 2 (two) times daily.     . Skin Protectants, Misc. (EUCERIN) cream Apply topically as needed for wound care. 397 g 0   No current facility-administered medications for this visit.   No  family history on file. History   Social History  . Marital Status: Married    Spouse Name: N/A    Number of Children: N/A  . Years of Education: N/A   Social History Main Topics  . Smoking status: Never Smoker   . Smokeless tobacco: None  . Alcohol Use: No  . Drug Use: No  . Sexual Activity: Not Currently   Other Topics Concern  . None   Social History Narrative   ** Merged History Encounter **       Pt. plans to sue [employer] for testing supplies [phone] [name] July 28,2010 2:10PM      Pt is [place].    Review of Systems: Pertinent items are noted in HPI. Objective:  Physical Exam: Filed  Vitals:   05/27/14 1441  BP: 133/74  Pulse: 80  Temp: 98.2 F (36.8 C)  TempSrc: Oral  Height: 5\' 6"  (1.676 m)  Weight: 195 lb 4.8 oz (88.587 kg)  SpO2: 100%   General:  Sitting in chair, tearful HEENT: PERRL, EOMI, no scleral icterus Cardiac: RRR, no rubs, murmurs or gallops Pulm: clear to auscultation bilaterally, moving normal volumes of air Abd: soft, nontender, nondistended, BS present Ext: warm and well perfused, no pedal edema Neuro: alert and oriented X3, cranial nerves II-XII grossly intact  Assessment & Plan:  Please see problem oriented charting  Pt discussed with Dr. Beryle Beams

## 2014-06-24 ENCOUNTER — Encounter: Payer: Self-pay | Admitting: *Deleted

## 2014-06-24 ENCOUNTER — Telehealth: Payer: Self-pay | Admitting: Internal Medicine

## 2014-06-24 NOTE — Telephone Encounter (Signed)
Rec'd phone call from  [place] at [facility]  Patient needing a new referral for this year for her EYE EXAM.  Patient has [place] Medicaid.  Could you please put a new referral in for this year for this patient.  Thanks  in Advance.

## 2014-06-30 ENCOUNTER — Telehealth: Payer: Self-pay | Admitting: Internal Medicine

## 2014-06-30 NOTE — Telephone Encounter (Signed)
Call to patient to confirm appointment for 07/01/14 at 1:45 lmtcb

## 2014-07-01 ENCOUNTER — Encounter: Payer: Self-pay | Admitting: Internal Medicine

## 2014-07-01 ENCOUNTER — Ambulatory Visit (INDEPENDENT_AMBULATORY_CARE_PROVIDER_SITE_OTHER): Payer: Medicaid Other | Admitting: Internal Medicine

## 2014-07-01 VITALS — BP 118/61 | HR 79 | Temp 98.0°F | Ht 66.0 in | Wt 198.2 lb

## 2014-07-01 DIAGNOSIS — E11649 Type 2 diabetes mellitus with hypoglycemia without coma: Secondary | ICD-10-CM

## 2014-07-01 DIAGNOSIS — E11321 Type 2 diabetes mellitus with mild nonproliferative diabetic retinopathy with macular edema: Secondary | ICD-10-CM

## 2014-07-01 DIAGNOSIS — E113219 Type 2 diabetes mellitus with mild nonproliferative diabetic retinopathy with macular edema, unspecified eye: Secondary | ICD-10-CM

## 2014-07-01 LAB — GLUCOSE, CAPILLARY
GLUCOSE-CAPILLARY: 106 mg/dL — AB (ref 70–99)
Glucose-Capillary: 69 mg/dL — ABNORMAL LOW (ref 70–99)

## 2014-07-01 NOTE — Progress Notes (Signed)
Subjective:   [name] female   DOB: 14-Sep-1970 44 y.o.   MRN: [id number]  HPI: Jennifer [name] is a 44 y.o. [place] woman accompanied by a translator presents for diabetes recheck.   DM - Patient checking blood sugars 1 time daily before breakfast and when she feels hypoglycemic. Currently taking insulin 40 units BID even if she doesn't eat dinner and metformin occasionally and not consistently. Pt has had 4-5 symptomatic hypoglycemic episodes since last visit. denies polyuria, polydipsia, nausea, vomiting, diarrhea.  does not request refills today. There is a 20 point discrepancy between the clinics meter and the patient's meter.   The patient was set to have enucleation surgery for her acute glaucoma but has been rescheduled once her DM is under better control. Pt is still taking the drops and has some ongoing pain in her left eye that is unchanged.   Past Medical History  Diagnosis Date  . Routine/ritual circumcision   . Anemia, iron deficiency   . Glaucoma associated with ocular inflammation   . Uveitis   . Decreased visual acuity     Left eye  . Hair loss   . Hyperlipidemia   . Pap smear abnormality of cervix with LGSIL   . Type II diabetes mellitus   . Diabetes mellitus without complication   . Hypercholesteremia   . Blindness of left eye    Current Outpatient Prescriptions  Medication Sig Dispense Refill  . ACCU-CHEK SMARTVIEW test strip CHECK BLOOD SUGAR 4 TIMES DAILY BEFORE MEAL AND BEDTIME 150 each 6  . atorvastatin (LIPITOR) 20 MG tablet Take 20 mg by mouth daily.    . brimonidine (ALPHAGAN P) 0.1 % SOLN Place 1 drop into the left eye every 8 (eight) hours.    . brimonidine-timolol (COMBIGAN) 0.2-0.5 % ophthalmic solution Place 1 drop into the left eye every 12 (twelve) hours.    . clotrimazole-betamethasone (LOTRISONE) cream Apply 1 application topically 2 (two) times daily. (Patient not taking: Reported on 05/21/2014) 30 g 0  . dorzolamide-timolol  (COSOPT) 22.3-6.8 MG/ML ophthalmic solution Place 1 drop into the left eye 2 (two) times daily.    Marland Kitchen gabapentin (NEURONTIN) 100 MG capsule Take 1 capsule (100 mg total) by mouth 3 (three) times daily. (Patient not taking: Reported on 05/21/2014) 90 capsule 2  . Insulin NPH, Human,, Isophane, (HUMULIN N KWIKPEN) 100 UNIT/ML Kiwkpen Takes 60 units in the morning,and 40 units in the evening. 15 mL 6  . Insulin Pen Needle 32G X 4 MM MISC Use to give insulin twice a day. 100 each 2  . Insulin Syringe-Needle U-100 (RELION INSULIN SYRINGE 1ML/31G) 31G X 5/16" 1 ML MISC 1 each by Does not apply route daily. 100 each 11  . Lancets (ACCU-CHEK SOFT TOUCH) lancets CHECK BLOOD SUGAR 4 TIMES DAILY BEFORE MEAL AND BEDTIME 100 each 11  . metFORMIN (GLUCOPHAGE) 1000 MG tablet Take 1 tablet (1,000 mg total) by mouth 2 (two) times daily with a meal. (Patient not taking: Reported on 05/21/2014) 60 tablet 11  . prednisoLONE acetate (PRED FORTE) 1 % ophthalmic suspension Place 1 drop into the right eye 2 (two) times daily.     . Skin Protectants, Misc. (EUCERIN) cream Apply topically as needed for wound care. 397 g 0   No current facility-administered medications for this visit.   No family history on file. History   Social History  . Marital Status: Married    Spouse Name: N/A  . Number of Children: N/A  .  Years of Education: N/A   Social History Main Topics  . Smoking status: Never Smoker   . Smokeless tobacco: Not on file  . Alcohol Use: No  . Drug Use: No  . Sexual Activity: Not Currently   Other Topics Concern  . None   Social History Narrative   ** Merged History Encounter **       Pt. plans to sue [employer] for testing supplies [phone] [name] July 28,2010 2:10PM      Pt is [place].    Review of Systems: Pertinent items are noted in HPI. Objective:  Physical Exam: Filed Vitals:   07/01/14 1355  BP: 118/61  Pulse: 79  Temp: 98 F (36.7 C)  TempSrc: Oral  Height: 5'  6" (1.676 m)  Weight: 198 lb 3.2 oz (89.903 kg)  SpO2: 100%   General: sitting in chair, NAD  Cardiac: RRR, no rubs, murmurs or gallops Pulm: clear to auscultation bilaterally, moving normal volumes of air Abd: soft, nontender, nondistended, BS present Ext: warm and well perfused, no pedal edema  Assessment & Plan:  Please see problem oriented charting  Pt discussed with Dr. Ellwood Dense

## 2014-07-01 NOTE — Patient Instructions (Signed)
General Instructions:   Please try to bring all your medicines next time. This will help [place] keep you safe from mistakes.   Progress Toward Treatment Goals:  Treatment Goal 03/15/2014  Hemoglobin A1C unchanged    Self Care Goals & Plans:  Self Care Goal 07/01/2014  Manage my medications take my medicines as prescribed; bring my medications to every visit; refill my medications on time  Monitor my health keep track of my blood glucose; bring my glucose meter and log to each visit  Eat healthy foods drink diet soda or water instead of juice or soda; eat more vegetables; eat foods that are low in salt; eat baked foods instead of fried foods  Be physically active -    Home Blood Glucose Monitoring 03/15/2014  Check my blood sugar 3 times a day  When to check my blood sugar before meals     Care Management & Community Referrals:  Referral 01/28/2013  Referrals made for care management support none needed

## 2014-07-01 NOTE — Progress Notes (Signed)
Hypoglycemic Event  CBG: 69 Treatment: Orange Juice  Symptoms: None  Follow-up CBG: Time: 14:46  CBG Result: 106  Possible Reasons for Event: Did not eat lunch.  Comments/MD notified: Yes    Deniyah Dillavou, Trey Sailors  Remember to initiate Hypoglycemia Order Set & complete

## 2014-07-01 NOTE — Progress Notes (Signed)
Case discussed with Dr. Sadek at the time of the visit.  We reviewed the resident's history and exam and pertinent patient test results.  I agree with the assessment, diagnosis, and plan of care documented in the resident's note. 

## 2014-07-02 NOTE — Assessment & Plan Note (Signed)
Lab Results  Component Value Date   HGBA1C 12.0 05/27/2014   HGBA1C 11.4 02/18/2014   HGBA1C 11.2 09/24/2013     Assessment: Diabetes control:   Progress toward A1C goal:    Comments: Pt has both diet and medication non-compliance that is complicating her management given hyperglycemic episodes and hypoglycemic episodes.   Plan: Medications:  Will stop night time insulin as per review that is when pt is having hypoglycemic episodes at about 3-4 am after taking and not eating dinner. Will continue NPH 40 units in the AM with breakfast and consistent taking of metformin 500mg  in the AM.  Home glucose monitoring: Frequency:  check before meal in AM and if eats a second meals Timing:   Instruction/counseling given: reminded to bring blood glucose meter & log to each visit, reminded to bring medications to each visit and discussed diet Educational resources provided: brochure (denies) Self management tools provided: copy of home glucose meter download Other plans: will follow up in 1 wk for re-titration and monitoring of hypoglycemia

## 2014-07-14 ENCOUNTER — Telehealth: Payer: Self-pay | Admitting: Internal Medicine

## 2014-07-14 NOTE — Telephone Encounter (Signed)
Call to patient to confirm appointment for 07/18/14 at 3:15 lmtcb

## 2014-07-18 ENCOUNTER — Encounter: Payer: Self-pay | Admitting: Internal Medicine

## 2014-07-18 ENCOUNTER — Ambulatory Visit: Payer: Medicaid Other | Admitting: Internal Medicine

## 2014-07-29 ENCOUNTER — Telehealth: Payer: Self-pay | Admitting: *Deleted

## 2014-07-29 NOTE — Telephone Encounter (Signed)
Son called clinic - - pt fell face down in backyard. Right cheek is bruised.  Suggest to take to ER - prefers to use ice for now. If any changes will go to ER. Pt voiced no c/o. Hilda Blades Marelly Wehrman RN 07/29/14 4:30PM

## 2014-08-31 ENCOUNTER — Encounter: Payer: Self-pay | Admitting: *Deleted

## 2014-09-14 ENCOUNTER — Encounter: Payer: Self-pay | Admitting: Internal Medicine

## 2014-09-14 ENCOUNTER — Ambulatory Visit (INDEPENDENT_AMBULATORY_CARE_PROVIDER_SITE_OTHER): Payer: Medicaid Other | Admitting: Internal Medicine

## 2014-09-14 VITALS — BP 143/69 | HR 85 | Temp 98.2°F | Wt 201.5 lb

## 2014-09-14 DIAGNOSIS — Z794 Long term (current) use of insulin: Secondary | ICD-10-CM

## 2014-09-14 DIAGNOSIS — E113219 Type 2 diabetes mellitus with mild nonproliferative diabetic retinopathy with macular edema, unspecified eye: Secondary | ICD-10-CM

## 2014-09-14 DIAGNOSIS — E1142 Type 2 diabetes mellitus with diabetic polyneuropathy: Secondary | ICD-10-CM | POA: Diagnosis not present

## 2014-09-14 DIAGNOSIS — E11321 Type 2 diabetes mellitus with mild nonproliferative diabetic retinopathy with macular edema: Secondary | ICD-10-CM

## 2014-09-14 LAB — POCT GLYCOSYLATED HEMOGLOBIN (HGB A1C): Hemoglobin A1C: 11

## 2014-09-14 LAB — GLUCOSE, CAPILLARY: GLUCOSE-CAPILLARY: 372 mg/dL — AB (ref 65–99)

## 2014-09-14 MED ORDER — GABAPENTIN 300 MG PO CAPS: 300.0000 mg | ORAL_CAPSULE | Freq: Three times a day (TID) | ORAL | Status: DC

## 2014-09-14 MED ORDER — INSULIN ISOPHANE HUMAN 100 UNIT/ML KWIKPEN: PEN_INJECTOR | SUBCUTANEOUS | Status: DC

## 2014-09-14 NOTE — Patient Instructions (Signed)
General Instructions:   Please try to bring all your medicines next time. This will help [place] keep you safe from mistakes.   For your diabetes:  1. Take your insulin 60 units in the morning and 40 units at night 2. Take the metformin 100mg  pills at the same time you take your insulin 3. Follow up in 1 week bring your meter at that time   Progress Toward Treatment Goals:  Treatment Goal 03/15/2014  Hemoglobin A1C unchanged    Self Care Goals & Plans:  Self Care Goal 09/14/2014  Manage my medications take my medicines as prescribed; bring my medications to every visit; refill my medications on time  Monitor my health check my feet daily; bring my glucose meter and log to each visit; keep track of my blood glucose  Eat healthy foods eat more vegetables; eat fruit for snacks and desserts; eat smaller portions; drink diet soda or water instead of juice or soda  Be physically active find an activity I enjoy    Home Blood Glucose Monitoring 03/15/2014  Check my blood sugar 3 times a day  When to check my blood sugar before meals     Care Management & Community Referrals:  Referral 01/28/2013  Referrals made for care management support none needed

## 2014-09-14 NOTE — Assessment & Plan Note (Signed)
Patient's leg pain symptoms seem to be in line with diabetic neuropathy. She previously been given gabapentin but has not taken any for relief. -Gabapentin 300 mg 3 times a day when necessary -Follow-up when patient returns for control of symptoms

## 2014-09-14 NOTE — Progress Notes (Signed)
Subjective:   [name] female   DOB: May 04, 1970 44 y.o.   MRN: [id number]  HPI: [name] is a 44 y.o. [place] woman accompanied by a Optometrist with pmh as listed below presents for DM follow up.   DM - Patient checking blood sugars inconsistently but most days before breakfast and the patient does bring in her meter for review. Currently taking NPH 30 units in the a.m and anywhere between 40-45 units in the p.m based on "how she's feeling." She has stopped taking her metformin altogether stating that she "feels that she doesn't need it." In about a month. She can take metformin anywhere between one to 2 times when she thinks she can feel her blood sugar elevated. She has a very poor and inconsistent appetite which has caused serious hypoglycemia in the past. No hypoglycemic episodes since last visit. denies polyuria, polydipsia, nausea, vomiting, diarrhea.  does not request refills today.  She is beginning to experience some "pins and needles" sensation in the bottom of her feet that has intermittently gotten worse over the past week she has not used any of her previously prescribed gabapentin for relief. She's had no weakness or changes in her ambulation and no wounds.   Past Medical History  Diagnosis Date  . Routine/ritual circumcision   . Anemia, iron deficiency   . Glaucoma associated with ocular inflammation   . Uveitis   . Decreased visual acuity     Left eye  . Hair loss   . Hyperlipidemia   . Pap smear abnormality of cervix with LGSIL   . Type II diabetes mellitus   . Diabetes mellitus without complication   . Hypercholesteremia   . Blindness of left eye    Current Outpatient Prescriptions  Medication Sig Dispense Refill  . ACCU-CHEK SMARTVIEW test strip CHECK BLOOD SUGAR 4 TIMES DAILY BEFORE MEAL AND BEDTIME 150 each 6  . atorvastatin (LIPITOR) 20 MG tablet Take 20 mg by mouth daily.    . brimonidine (ALPHAGAN P) 0.1 % SOLN Place 1 drop into the  left eye every 8 (eight) hours.    . brimonidine-timolol (COMBIGAN) 0.2-0.5 % ophthalmic solution Place 1 drop into the left eye every 12 (twelve) hours.    . clotrimazole-betamethasone (LOTRISONE) cream Apply 1 application topically 2 (two) times daily. (Patient not taking: Reported on 05/21/2014) 30 g 0  . dorzolamide-timolol (COSOPT) 22.3-6.8 MG/ML ophthalmic solution Place 1 drop into the left eye 2 (two) times daily.    Marland Kitchen gabapentin (NEURONTIN) 100 MG capsule Take 1 capsule (100 mg total) by mouth 3 (three) times daily. (Patient not taking: Reported on 05/21/2014) 90 capsule 2  . gabapentin (NEURONTIN) 300 MG capsule Take 1 capsule (300 mg total) by mouth 3 (three) times daily. 90 capsule 2  . Insulin NPH, Human,, Isophane, (HUMULIN N KWIKPEN) 100 UNIT/ML Kiwkpen Takes 60 units in the morning,and 40 units in the evening. 15 mL 6  . Insulin Pen Needle 32G X 4 MM MISC Use to give insulin twice a day. 100 each 2  . Insulin Syringe-Needle U-100 (RELION INSULIN SYRINGE 1ML/31G) 31G X 5/16" 1 ML MISC 1 each by Does not apply route daily. 100 each 11  . Lancets (ACCU-CHEK SOFT TOUCH) lancets CHECK BLOOD SUGAR 4 TIMES DAILY BEFORE MEAL AND BEDTIME 100 each 11  . metFORMIN (GLUCOPHAGE) 1000 MG tablet Take 1 tablet (1,000 mg total) by mouth 2 (two) times daily with a meal. (Patient not taking: Reported on 05/21/2014) 60 tablet  11  . prednisoLONE acetate (PRED FORTE) 1 % ophthalmic suspension Place 1 drop into the right eye 2 (two) times daily.     . Skin Protectants, Misc. (EUCERIN) cream Apply topically as needed for wound care. 397 g 0   No current facility-administered medications for this visit.   No family history on file. History   Social History  . Marital Status: Married    Spouse Name: N/A  . Number of Children: N/A  . Years of Education: N/A   Social History Main Topics  . Smoking status: Never Smoker   . Smokeless tobacco: Not on file  . Alcohol Use: No  . Drug Use: No  . Sexual  Activity: Not Currently   Other Topics Concern  . None   Social History Narrative   ** Merged History Encounter **       Pt. plans to sue [employer] for testing supplies [phone] [name] July 28,2010 2:10PM      Pt is [place].    Review of Systems: Pertinent items are noted in HPI. Objective:  Physical Exam: Filed Vitals:   09/14/14 1041  BP: 143/69  Pulse: 85  Temp: 98.2 F (36.8 C)  TempSrc: Oral  Weight: 201 lb 8 oz (91.4 kg)  SpO2: 100%   General: sitting in chair, NAD Cardiac: RRR, no rubs, murmurs or gallops Pulm: clear to auscultation bilaterally, moving normal volumes of air Abd: soft, nontender, nondistended, BS present Ext: warm and well perfused, no pedal edema, 5/5 LE strength  Assessment & Plan:  Please see problem oriented charting  Pt discussed with Dr. Lynnae January

## 2014-09-14 NOTE — Assessment & Plan Note (Addendum)
Lab Results  Component Value Date   HGBA1C 11.0 09/14/2014   HGBA1C 12.0 05/27/2014   HGBA1C 11.4 02/18/2014     Assessment: Diabetes control:   Progress toward A1C goal:    Comments: Continues to have stable poor control this is likely as a result of the patient self titrating based upon her "subjective feelings"  Plan: Medications:  Will try to use NPH 60 units in the AM and 40 units in the PM as previously started given these seem to be the times pt eats her biggest meals along with adherence to metformin 1000mg  BID Home glucose monitoring: Frequency:   Timing:   Instruction/counseling given: reminded to bring blood glucose meter & log to each visit, reminded to bring medications to each visit and discussed diet Educational resources provided: brochure, handout Self management tools provided: copy of home glucose meter download Other plans: f/u in 1 wk for titration and caution as the patient has had significant hypoglycemia in the past. Extensive needed and repeated education into her condition is usually needed for this patient. Would recommend that the patient have 2 appointment slots.

## 2014-09-16 NOTE — Progress Notes (Addendum)
Internal Medicine Clinic Attending  Case discussed with Dr. Sadek at the time of the visit.  We reviewed the resident's history and exam and pertinent patient test results.  I agree with the assessment, diagnosis, and plan of care documented in the resident's note.  

## 2014-09-23 ENCOUNTER — Ambulatory Visit (INDEPENDENT_AMBULATORY_CARE_PROVIDER_SITE_OTHER): Payer: Medicaid Other | Admitting: Pulmonary Disease

## 2014-09-23 ENCOUNTER — Encounter: Payer: Self-pay | Admitting: Pulmonary Disease

## 2014-09-23 VITALS — BP 141/77 | HR 78 | Temp 98.4°F | Ht 66.0 in | Wt 205.5 lb

## 2014-09-23 DIAGNOSIS — E11321 Type 2 diabetes mellitus with mild nonproliferative diabetic retinopathy with macular edema: Secondary | ICD-10-CM

## 2014-09-23 DIAGNOSIS — Z79899 Other long term (current) drug therapy: Secondary | ICD-10-CM

## 2014-09-23 DIAGNOSIS — E1140 Type 2 diabetes mellitus with diabetic neuropathy, unspecified: Secondary | ICD-10-CM | POA: Diagnosis not present

## 2014-09-23 DIAGNOSIS — E1165 Type 2 diabetes mellitus with hyperglycemia: Secondary | ICD-10-CM

## 2014-09-23 DIAGNOSIS — E113219 Type 2 diabetes mellitus with mild nonproliferative diabetic retinopathy with macular edema, unspecified eye: Secondary | ICD-10-CM

## 2014-09-23 DIAGNOSIS — Z794 Long term (current) use of insulin: Secondary | ICD-10-CM | POA: Diagnosis not present

## 2014-09-23 DIAGNOSIS — E1142 Type 2 diabetes mellitus with diabetic polyneuropathy: Secondary | ICD-10-CM

## 2014-09-23 LAB — GLUCOSE, CAPILLARY: GLUCOSE-CAPILLARY: 126 mg/dL — AB (ref 65–99)

## 2014-09-23 MED ORDER — GLUCOSE BLOOD VI STRP: ORAL_STRIP | Status: DC

## 2014-09-23 MED ORDER — CARRINGTON MOISTURE BARRIER EX CREA: TOPICAL_CREAM | CUTANEOUS | Status: DC | PRN

## 2014-09-23 MED ORDER — INSULIN ISOPHANE HUMAN 100 UNIT/ML KWIKPEN: PEN_INJECTOR | SUBCUTANEOUS | Status: DC

## 2014-09-23 MED ORDER — ACCU-CHEK SOFT TOUCH LANCETS MISC: Status: DC

## 2014-09-23 NOTE — Assessment & Plan Note (Addendum)
Lab Results  Component Value Date   HGBA1C 11.0 09/14/2014   HGBA1C 12.0 05/27/2014   HGBA1C 11.4 02/18/2014     Assessment: Diabetes control: poor control (HgbA1C >9%) Progress toward A1C goal:  improved Comments: CBG today 126. Her meter report has an average blood glucose of 258. She measures her blood glucose once a day around 6 AM to 9 AM. Those measurements ranged between 118 and 414. She had a low measurement of 67 on 09/16/2014 at 12:45 PM.  Plan: Medications:  500 mg metformin in the morning and 1000 mg metformin in the evening for 1 week. Then, 1000 mg metformin twice a day. Decrease morning insulin NPH from 60 units to 50 units. Continue 40 units in the evening. Home glucose monitoring: Frequency: 3 to 4 times a day Timing: before meals, at bedtime Instruction/counseling given: reminded to bring blood glucose meter & log to each visit Self management tools provided: copy of home glucose meter download Other plans:  -Follow up in 3-4 weeks -Referral placed for diabetes education.

## 2014-09-23 NOTE — Assessment & Plan Note (Signed)
Improved on gabapentin 300 mg 3 times a day.

## 2014-09-23 NOTE — Patient Instructions (Signed)
Check blood sugar at least 3 times a day.   METFORMIN:  09/24/2014 (Saturday) - 09/30/2014 (Friday): 500mg  with morning meal and 1000mg  with evening meal 10/01/2014 (Saturday): 1000mg  with morning meal and 1000mg  with evening meal  INSULIN: 50 units at 8:00AM 40 units at 8:00PM

## 2014-09-23 NOTE — Progress Notes (Signed)
Subjective:  [name], female    DOB: 10/13/1970, 44 y.o.   MRN: [id number]  HPI Jennifer [name] is a 44 year old woman with history of iron deficiency anemia, hyperlipidemia, type 2 diabetes mellitus, left eye blindness presenting for follow-up.  DM2: She was last seen in clinic on 09/14/2014. At that time she was told to use NPH 60 units in the morning and 40 units in the evening as well as metformin 1000 mg twice a day. She was restarted on gabapentin 300 mg 3 times a day for diabetic neuropathy.  She reports she takes metformin 1000 mg in the evening, and she takes 60 units of insulin at 7:30 AM and 40 units of insulin at 9 PM. Her meter report has an average blood glucose of 258. She measures her blood glucose once a day around 6 AM to 9 AM. Those measurements ranged between 118 and 414. She had a low measurement of 67 on 09/16/2014 at 12:45 PM. She reports she eats at least 3 meals a day and sometimes more when she feels hypoglycemic.  She reports her neuropathy has improved since starting the gabapentin.  Review of Systems Constitutional: no fevers/chills Eyes: no vision changes Ears, nose, mouth, throat, and face: no cough Respiratory: no shortness of breath Cardiovascular: no chest pain Gastrointestinal: no nausea/vomiting, no abdominal pain, no constipation, no diarrhea Genitourinary: no dysuria, no hematuria Integument: no rash Hematologic/lymphatic: no bleeding/bruising, no edema Musculoskeletal: no arthralgias, no myalgias Neurological: no paresthesias, no weakness  Past Medical History  Diagnosis Date  . Routine/ritual circumcision   . Anemia, iron deficiency   . Glaucoma associated with ocular inflammation   . Uveitis   . Decreased visual acuity     Left eye  . Hair loss   . Hyperlipidemia   . Pap smear abnormality of cervix with LGSIL   . Type II diabetes mellitus   . Diabetes mellitus without complication   . Hypercholesteremia   . Blindness  of left eye     Current Outpatient Prescriptions on File Prior to Visit  Medication Sig Dispense Refill  . ACCU-CHEK SMARTVIEW test strip CHECK BLOOD SUGAR 4 TIMES DAILY BEFORE MEAL AND BEDTIME 150 each 6  . atorvastatin (LIPITOR) 20 MG tablet Take 20 mg by mouth daily.    . brimonidine (ALPHAGAN P) 0.1 % SOLN Place 1 drop into the left eye every 8 (eight) hours.    . brimonidine-timolol (COMBIGAN) 0.2-0.5 % ophthalmic solution Place 1 drop into the left eye every 12 (twelve) hours.    . dorzolamide-timolol (COSOPT) 22.3-6.8 MG/ML ophthalmic solution Place 1 drop into the left eye 2 (two) times daily.    Marland Kitchen gabapentin (NEURONTIN) 300 MG capsule Take 1 capsule (300 mg total) by mouth 3 (three) times daily. 90 capsule 2  . Insulin NPH, Human,, Isophane, (HUMULIN N KWIKPEN) 100 UNIT/ML Kiwkpen Takes 60 units in the morning,and 40 units in the evening. 15 mL 6  . Insulin Pen Needle 32G X 4 MM MISC Use to give insulin twice a day. 100 each 2  . Insulin Syringe-Needle U-100 (RELION INSULIN SYRINGE 1ML/31G) 31G X 5/16" 1 ML MISC 1 each by Does not apply route daily. 100 each 11  . Lancets (ACCU-CHEK SOFT TOUCH) lancets CHECK BLOOD SUGAR 4 TIMES DAILY BEFORE MEAL AND BEDTIME 100 each 11  . metFORMIN (GLUCOPHAGE) 1000 MG tablet Take 1 tablet (1,000 mg total) by mouth 2 (two) times daily with a meal. (Patient not taking: Reported on 05/21/2014)  60 tablet 11  . prednisoLONE acetate (PRED FORTE) 1 % ophthalmic suspension Place 1 drop into the right eye 2 (two) times daily.     . Skin Protectants, Misc. (EUCERIN) cream Apply topically as needed for wound care. 397 g 0   No current facility-administered medications on file prior to visit.    Today's Vitals   09/23/14 0932  BP: 141/77  Pulse: 78  Temp: 98.4 F (36.9 C)  TempSrc: Oral  Height: 5\' 6"  (1.676 m)  Weight: 205 lb 8 oz (93.214 kg)  SpO2: 100%  PainSc: 0-No pain   Objective:  Physical Exam  Constitutional: She is oriented to person,  place, and time. She appears well-nourished. No distress.  Eyes: Conjunctivae are normal.  Neck: Neck supple.  Cardiovascular: Normal rate.   Pulmonary/Chest: Effort normal.  Abdominal: Soft.  Musculoskeletal: Normal range of motion.  Neurological: She is alert and oriented to person, place, and time.  Skin: Skin is warm and dry.  Psychiatric: She has a normal mood and affect.   Assessment & Plan:  Please refer to problem based charting.

## 2014-09-23 NOTE — Progress Notes (Signed)
Internal Medicine Clinic Attending  Case discussed with Dr. Krall at the time of the visit.  We reviewed the resident's history and exam and pertinent patient test results.  I agree with the assessment, diagnosis, and plan of care documented in the resident's note.  

## 2014-10-11 ENCOUNTER — Emergency Department (HOSPITAL_COMMUNITY)
Admission: EM | Admit: 2014-10-11 | Discharge: 2014-10-11 | Disposition: A | Discharge status: Home / Self Care | Payer: Medicaid Other | Source: Home / Self Care

## 2014-10-11 ENCOUNTER — Encounter (HOSPITAL_COMMUNITY): Payer: Self-pay | Admitting: Emergency Medicine

## 2014-10-11 DIAGNOSIS — H109 Unspecified conjunctivitis: Secondary | ICD-10-CM | POA: Diagnosis not present

## 2014-10-11 DIAGNOSIS — H547 Unspecified visual loss: Secondary | ICD-10-CM | POA: Diagnosis not present

## 2014-10-11 DIAGNOSIS — E118 Type 2 diabetes mellitus with unspecified complications: Secondary | ICD-10-CM | POA: Diagnosis not present

## 2014-10-11 NOTE — ED Notes (Signed)
C/o poss left pink eye Unable to obtain visual acuity Sx include watery, redness, and irritation Alert, no signs of acute distress.

## 2014-10-11 NOTE — ED Provider Notes (Signed)
CSN: [id number]     Arrival date & time 10/11/14  1322 History   None    Chief Complaint  Patient presents with  . Conjunctivitis   (Consider location/radiation/quality/duration/timing/severity/associated sxs/prior Treatment) HPI  Left eye irritation. Ongoing for weeks. Currently saying an ophthalmologist for this problem and has an appointment set for tomorrow. Decided to come in today due to ongoing irritation and concern for possible foreign body the patient denies any trauma to the eye. Patient on prednisolone drops, atropine drops and a triple antibiotic drops as well. Patient is followed by the [facility] in [place]. Patient with chronic ongoing diabetes that appears to be poorly controlled as her glucose tends to be above 200 on a regular basis per family members were with patient. Patient endorses gradual loss of vision in the eye and irritation described as a sensation of sand or something in her eye. Denies fevers, swelling, discharge, headaches, fevers. Patient is on injectable therapy for diabetes twice daily.  Past Medical History  Diagnosis Date  . Routine/ritual circumcision   . Anemia, iron deficiency   . Glaucoma associated with ocular inflammation   . Uveitis   . Decreased visual acuity     Left eye  . Hair loss   . Hyperlipidemia   . Pap smear abnormality of cervix with LGSIL   . Type II diabetes mellitus   . Diabetes mellitus without complication   . Hypercholesteremia   . Blindness of left eye    Past Surgical History  Procedure Laterality Date  . Cesarean section     No family history on file. History  Substance Use Topics  . Smoking status: Never Smoker   . Smokeless tobacco: Not on file  . Alcohol Use: No   OB History    Gravida Para Term Preterm AB TAB SAB Ectopic Multiple Living   7 4 4  3  3   4      Review of Systems Per HPI with all other pertinent systems negative.   Allergies  Review of patient's allergies indicates no known  allergies.  Home Medications   Prior to Admission medications   Medication Sig Start Date End Date Taking? Authorizing Provider  dorzolamide-timolol (COSOPT) 22.3-6.8 MG/ML ophthalmic solution Place 1 drop into the left eye 2 (two) times daily.   Yes Historical Provider, MD  Insulin NPH, Human,, Isophane, (HUMULIN N KWIKPEN) 100 UNIT/ML Kiwkpen Take 50 units in the morning,and 40 units in the evening. 09/23/14  Yes Milagros Loll, MD  prednisoLONE acetate (PRED FORTE) 1 % ophthalmic suspension Place 1 drop into the right eye 2 (two) times daily.    Yes Historical Provider, MD  atorvastatin (LIPITOR) 20 MG tablet Take 20 mg by mouth daily.    Historical Provider, MD  brimonidine (ALPHAGAN P) 0.1 % SOLN Place 1 drop into the left eye every 8 (eight) hours.    Historical Provider, MD  brimonidine-timolol (COMBIGAN) 0.2-0.5 % ophthalmic solution Place 1 drop into the left eye every 12 (twelve) hours.    Historical Provider, MD  gabapentin (NEURONTIN) 300 MG capsule Take 1 capsule (300 mg total) by mouth 3 (three) times daily. 09/14/14 09/14/15  Jerrye Noble, MD  glucose blood (ACCU-CHEK SMARTVIEW) test strip Check Blood Sugar 4 times daily before meals and at bedtime. Dx code: B71.696 09/23/14   Milagros Loll, MD  Insulin Pen Needle 32G X 4 MM MISC Use to give insulin twice a day. 03/15/14   Milagros Loll, MD  Insulin Syringe-Needle U-100 (RELION INSULIN SYRINGE 1ML/31G) 31G X 5/16" 1 ML MISC 1 each by Does not apply route daily. 06/25/13   Jerrye Noble, MD  Lancets (ACCU-CHEK SOFT TOUCH) lancets Check Blood Sugar 4 times daily before meals and at bedtime. Dx code: Y60.630 09/23/14   Milagros Loll, MD  metFORMIN (GLUCOPHAGE) 1000 MG tablet Take 1 tablet (1,000 mg total) by mouth 2 (two) times daily with a meal. 04/13/14 04/13/15  Jerrye Noble, MD  Skin Protectants, Misc. (EUCERIN) cream Apply topically as needed for wound care. 09/23/14   Milagros Loll, MD   BP 119/83 mmHg  Pulse 78  Temp(Src)  98.5 F (36.9 C) (Oral)  Resp 16  SpO2 97%  LMP 08/30/2014 Physical Exam Physical Exam  Constitutional: oriented to person, place, and time. appears well-developed and well-nourished. No distress.  HENT:  Head: Normocephalic and atraumatic.  Eyes: Left eye with a cloudy retina and surrounding conjunctival erythema. No focal abnormality or foreign body noted. EOMI. PERRLA. Eyelids normal. No discharge noted.  Neck: Normal range of motion.  Cardiovascular: RRR, no m/r/g, 2+ distal pulses,  Pulmonary/Chest: Effort normal and breath sounds normal. No respiratory distress.  Abdominal: Soft. Bowel sounds are normal. NonTTP, no distension.  Musculoskeletal: Normal range of motion. Non ttp, no effusion.  Neurological: alert and oriented to person, place, and time.  Skin: Skin is warm. No rash noted. non diaphoretic.  Psychiatric: normal mood and affect. behavior is normal. Judgment and thought content normal.   ED Course  Procedures (including critical care time) Labs Review Labs Reviewed - No data to display  Imaging Review No results found.   MDM   1. Vision loss   2. Conjunctivitis of left eye   3. Diabetes mellitus with complication    Suspect progressive glaucoma which is in part due to poorly controlled diabetes. Discussed sliding scale titration of insulin given morning sugar readings and family will discuss this further with primary care physician. Recommending patient use preservative-free eyedrops today until patient able to see her ophthalmologist tomorrow. This is only for additional relief as there is no evidence of foreign body or treatable condition outside of what the patient is artery receiving treatment for. Of note again patient is on 3 eyedrops including prednisolone, atropine, and the triple antibiotic.    Waldemar Dickens, MD 10/11/14 [phone]

## 2014-10-11 NOTE — Discharge Instructions (Signed)
There is no evidence of a foreign body in your eye. The irritation of your eye is likely from chronic internal damage and poorly controlled diabetes. Please continue the 3 eyedrops you're already on. Please consider using preservative free eyedrops for additional lubrication and relief. Please consider discussing with your primary care physician the titration of your insulin to better control your diabetes. Please keep your eye appointment tomorrow.

## 2014-10-14 ENCOUNTER — Encounter: Payer: Self-pay | Admitting: Internal Medicine

## 2014-10-14 ENCOUNTER — Telehealth: Payer: Self-pay | Admitting: Dietician

## 2014-10-14 NOTE — Telephone Encounter (Signed)
Call to patient to confirm appointment for 10/17/14 at 3:30 lmtcb

## 2014-10-17 ENCOUNTER — Ambulatory Visit (INDEPENDENT_AMBULATORY_CARE_PROVIDER_SITE_OTHER): Payer: Medicaid Other | Admitting: Dietician

## 2014-10-17 VITALS — Wt 206.5 lb

## 2014-10-17 DIAGNOSIS — E119 Type 2 diabetes mellitus without complications: Secondary | ICD-10-CM

## 2014-10-17 DIAGNOSIS — E113219 Type 2 diabetes mellitus with mild nonproliferative diabetic retinopathy with macular edema, unspecified eye: Secondary | ICD-10-CM

## 2014-10-17 DIAGNOSIS — Z713 Dietary counseling and surveillance: Secondary | ICD-10-CM

## 2014-10-17 NOTE — Patient Instructions (Signed)
Please make an appointment with [name] and your doctor in the next few weeks.   PLease reduce your evening insulin to 35 units and continue taking 50 unit sin the morning.    Basic Carbohydrate Counting for Diabetes Mellitus Carbohydrate counting is a method for keeping track of the amount of carbohydrates you eat. Eating carbohydrates naturally increases the level of sugar (glucose) in your blood, so it is important for you to know the amount that is okay for you to have in every meal. Carbohydrate counting helps keep the level of glucose in your blood within normal limits. The amount of carbohydrates allowed is different for every person. A dietitian can help you calculate the amount that is right for you. Once you know the amount of carbohydrates you can have, you can count the carbohydrates in the foods you want to eat. Carbohydrates are found in the following foods:  Grains, such as breads and cereals.  Dried beans and soy products.  Starchy vegetables, such as potatoes, peas, and corn.  Fruit and fruit juices.  Milk and yogurt.  Sweets and snack foods, such as cake, cookies, candy, chips, soft drinks, and fruit drinks. CARBOHYDRATE COUNTING There are two ways to count the carbohydrates in your food. You can use either of the methods or a combination of both. Reading the "Nutrition Facts" on [place] The "Nutrition Facts" is an area that is included on the labels of almost all packaged food and beverages in the [place]. It includes the serving size of that food or beverage and information about the nutrients in each serving of the food, including the grams (g) of carbohydrate per serving.  Decide the number of servings of this food or beverage that you will be able to eat or drink. Multiply that number of servings by the number of grams of carbohydrate that is listed on the label for that serving. The total will be the amount of carbohydrates you will be having when you eat or  drink this food or beverage. Learning Standard Serving Sizes of Food When you eat food that is not packaged or does not include "Nutrition Facts" on the label, you need to measure the servings in order to count the amount of carbohydrates.A serving of most carbohydrate-rich foods contains about 15 g of carbohydrates. The following list includes serving sizes of carbohydrate-rich foods that provide 15 g ofcarbohydrate per serving:   1 slice of bread (1 oz) or 1 six-inch tortilla.    of a hamburger bun or English muffin.  4-6 crackers.   cup unsweetened dry cereal.    cup hot cereal.   cup rice or pasta.    cup mashed potatoes or  of a large baked potato.  1 cup fresh fruit or one small piece of fruit.    cup canned or frozen fruit or fruit juice.  1 cup milk.   cup plain fat-free yogurt or yogurt sweetened with artificial sweeteners.   cup cooked dried beans or starchy vegetable, such as peas, corn, or potatoes.  Decide the number of standard-size servings that you will eat. Multiply that number of servings by 15 (the grams of carbohydrates in that serving). For example, if you eat 2 cups of strawberries, you will have eaten 2 servings and 30 g of carbohydrates (2 servings x 15 g = 30 g). For foods such as soups and casseroles, in which more than one food is mixed in, you will need to count the carbohydrates in each food  that is included. EXAMPLE OF CARBOHYDRATE COUNTING Sample Dinner  3 oz chicken breast.   cup of brown rice.   cup of corn.  1 cup milk.   1 cup strawberries with sugar-free whipped topping.

## 2014-10-17 NOTE — Progress Notes (Signed)
Diabetes Self-Management Education  Visit Type:  Follow-up  Last visit 1/ 2015  Appt. Start Time: 335 Appt. End Time: 450  10/17/2014  [name], identified by name and date of birth, is a 44 y.o. female with a diagnosis of Diabetes:  .  Other people present during visit:  Interpreter, Family Member  She says she is frustrated that her blood sugars are no better and she is told different things at times that confuse her. She wants to have eye surgery but was told she cannot until she gets her blood sugars under control  ASSESSMENT  Weight 206 lb 8 oz (93.668 kg), last menstrual period 08/30/2014. This is increased from her last visit, however, her A1C remains high and with little change Body mass index is 33.35 kg/(m^2).    Subsequent Visit Information:  Since your last visit, have you continued or began the use of a meal plan?: No Since your last visit, have you continued or began to exercise on a consistent basis?: No Since your last visit have you continued or begun to take your medications as prescribed?: Yes (not metformin) Since your last visit have you experienced any weight changes?: Gain Weight Gain (lbs): 12 Since your last visit, are you checking your blood glucose at least once a day?: Yes  Psychosocial:   Patient Belief/Attitude about Diabetes: Motivated to manage diabetes Self-care barriers: Impaired vision, English as a second language, Lack of transportation, Lack of material resources Self-management support: Doctor's office, Family, CDE visits Other persons present: Astronomer, Family Member Patient Concerns: Glycemic Control Special Needs: Large print, Simplified materials, Verbal instruction Preferred Learning Style: Auditory Learning Readiness: Ready  Complications:   How often do you check your blood sugar?: 1-2 times/day Fasting Blood glucose range (mg/dL): >200 Postprandial Blood glucose range (mg/dL): 180-200, >200 Number of hypoglycemic  episodes per month:  (5) Number of hyperglycemic episodes per week:  (30) Have you had a dilated eye exam in the past 12 months?: Yes  CBG was 101 today in office- patient felt low at this value, treated with 7-8 grams carb and she felt better Meter download shows highest is fasting, then decreases as day goes on. Agree with decreasing bedtime insulin as her fasting readings are very high and other are low or normal which may indicate undetected hypoglycemia nocturnally.   patient and daughter ask about low to he,p the pain in her feet and her eyes. IT was explained that having blood sugar in 100s ,ost to  all the time would help  Diet Intake:   patient has been seen many times in past for  Meal planning. Her daughter says she goes for long periods of time without eating, patient says she eats many sweets Breakfast: likes sweets, eats iregularrly  Exercise:  Exercise: ADL's, Light (walking / raking leaves) Light Exercise amount of time (min / week): 70   Individualized Plan for Diabetes Self-Management Training:   Learning Objective:  Patient will have a greater understanding of diabetes self-management.  Patient education plan per assessed needs and concerns is to attend individual sessions for  meal planning, medication, self monitoring  Education Topics Reviewed with Patient Today:     Reviewed patients medication for diabetes, action, purpose, timing of dose and side effects. Identified appropriate SMBG and/or A1C goals. Taught treatment of hypoglycemia - the 15 rule. Relationship between chronic complications and blood glucose control      PATIENTS GOALS/Plan (Developed by the patient):  Nutrition: Follow meal plan discussed Medications:  take my medication as prescribed  Patient Self Evaluation of Goals - Patient rates self as meeting previously set goals:     Plan:   Patient Instructions  Please make an appointment with [name] and your doctor in the next few weeks.    PLease reduce your evening insulin to 35 units and continue taking 50 unit sin the morning.    Basic Carbohydrate Counting for Diabetes Mellitus Carbohydrate counting is a method for keeping track of the amount of carbohydrates you eat. Eating carbohydrates naturally increases the level of sugar (glucose) in your blood, so it is important for you to know the amount that is okay for you to have in every meal. Carbohydrate counting helps keep the level of glucose in your blood within normal limits. The amount of carbohydrates allowed is different for every person. A dietitian can help you calculate the amount that is right for you. Once you know the amount of carbohydrates you can have, you can count the carbohydrates in the foods you want to eat. Carbohydrates are found in the following foods:  Grains, such as breads and cereals.  Dried beans and soy products.  Starchy vegetables, such as potatoes, peas, and corn.  Fruit and fruit juices.  Milk and yogurt.  Sweets and snack foods, such as cake, cookies, candy, chips, soft drinks, and fruit drinks. CARBOHYDRATE COUNTING There are two ways to count the carbohydrates in your food. You can use either of the methods or a combination of both. Reading the "Nutrition Facts" on [place] The "Nutrition Facts" is an area that is included on the labels of almost all packaged food and beverages in the [place]. It includes the serving size of that food or beverage and information about the nutrients in each serving of the food, including the grams (g) of carbohydrate per serving.  Decide the number of servings of this food or beverage that you will be able to eat or drink. Multiply that number of servings by the number of grams of carbohydrate that is listed on the label for that serving. The total will be the amount of carbohydrates you will be having when you eat or drink this food or beverage. Learning Standard Serving Sizes of Food When  you eat food that is not packaged or does not include "Nutrition Facts" on the label, you need to measure the servings in order to count the amount of carbohydrates.A serving of most carbohydrate-rich foods contains about 15 g of carbohydrates. The following list includes serving sizes of carbohydrate-rich foods that provide 15 g ofcarbohydrate per serving:   1 slice of bread (1 oz) or 1 six-inch tortilla.    of a hamburger bun or English muffin.  4-6 crackers.   cup unsweetened dry cereal.    cup hot cereal.   cup rice or pasta.    cup mashed potatoes or  of a large baked potato.  1 cup fresh fruit or one small piece of fruit.    cup canned or frozen fruit or fruit juice.  1 cup milk.   cup plain fat-free yogurt or yogurt sweetened with artificial sweeteners.   cup cooked dried beans or starchy vegetable, such as peas, corn, or potatoes.  Decide the number of standard-size servings that you will eat. Multiply that number of servings by 15 (the grams of carbohydrates in that serving). For example, if you eat 2 cups of strawberries, you will have eaten 2 servings and 30 g of carbohydrates (2 servings x  15 g = 30 g). For foods such as soups and casseroles, in which more than one food is mixed in, you will need to count the carbohydrates in each food that is included. EXAMPLE OF CARBOHYDRATE COUNTING Sample Dinner  3 oz chicken breast.   cup of brown rice.   cup of corn.  1 cup milk.   1 cup strawberries with sugar-free whipped topping.        Expected Outcomes:  Demonstrated interest in learning. Expect positive outcomes Education material provided: Carbohydrate counting sheet If problems or questions, patient to contact team via:  Phone Future DSME appointment: - 2 wks

## 2014-10-20 ENCOUNTER — Other Ambulatory Visit: Payer: Self-pay | Admitting: Pulmonary Disease

## 2014-10-27 ENCOUNTER — Telehealth: Payer: Self-pay | Admitting: Internal Medicine

## 2014-10-27 NOTE — Telephone Encounter (Signed)
Call to patient to confirm appointment for 10/28/14 at 3:15 lmtcb

## 2014-10-28 ENCOUNTER — Encounter: Payer: Self-pay | Admitting: Internal Medicine

## 2014-10-28 ENCOUNTER — Ambulatory Visit (INDEPENDENT_AMBULATORY_CARE_PROVIDER_SITE_OTHER): Payer: Medicaid Other | Admitting: Internal Medicine

## 2014-10-28 VITALS — BP 133/68 | HR 79 | Temp 98.1°F | Wt 202.2 lb

## 2014-10-28 DIAGNOSIS — E559 Vitamin D deficiency, unspecified: Secondary | ICD-10-CM

## 2014-10-28 DIAGNOSIS — Z0000 Encounter for general adult medical examination without abnormal findings: Secondary | ICD-10-CM

## 2014-10-28 DIAGNOSIS — D509 Iron deficiency anemia, unspecified: Secondary | ICD-10-CM | POA: Diagnosis not present

## 2014-10-28 DIAGNOSIS — Z6832 Body mass index (BMI) 32.0-32.9, adult: Secondary | ICD-10-CM

## 2014-10-28 DIAGNOSIS — E113219 Type 2 diabetes mellitus with mild nonproliferative diabetic retinopathy with macular edema, unspecified eye: Secondary | ICD-10-CM

## 2014-10-28 DIAGNOSIS — Z794 Long term (current) use of insulin: Secondary | ICD-10-CM | POA: Diagnosis not present

## 2014-10-28 DIAGNOSIS — E1165 Type 2 diabetes mellitus with hyperglycemia: Secondary | ICD-10-CM

## 2014-10-28 DIAGNOSIS — E11321 Type 2 diabetes mellitus with mild nonproliferative diabetic retinopathy with macular edema: Secondary | ICD-10-CM | POA: Diagnosis not present

## 2014-10-28 LAB — COMPLETE METABOLIC PANEL WITH GFR
ALT: 10 U/L (ref 0–35)
AST: 13 U/L (ref 0–37)
Albumin: 3.6 g/dL (ref 3.5–5.2)
Alkaline Phosphatase: 105 U/L (ref 39–117)
BUN: 14 mg/dL (ref 6–23)
CHLORIDE: 102 meq/L (ref 96–112)
CO2: 28 mEq/L (ref 19–32)
Calcium: 8.7 mg/dL (ref 8.4–10.5)
Creat: 0.63 mg/dL (ref 0.50–1.10)
GFR, Est African American: >89 mL/min
Glucose, Bld: 270 mg/dL — ABNORMAL HIGH (ref 70–99)
POTASSIUM: 4.2 meq/L (ref 3.5–5.3)
Sodium: 136 mEq/L (ref 135–145)
Total Bilirubin: 0.3 mg/dL (ref 0.2–1.2)
Total Protein: 7.2 g/dL (ref 6.0–8.3)

## 2014-10-28 LAB — CBC WITH DIFFERENTIAL/PLATELET
BASOS ABS: 0.1 10*3/uL (ref 0.0–0.1)
Basophils Relative: 1 % (ref 0–1)
Eosinophils Absolute: 0.6 10*3/uL (ref 0.0–0.7)
Eosinophils Relative: 10 % — ABNORMAL HIGH (ref 0–5)
HEMATOCRIT: 33.8 % — AB (ref 36.0–46.0)
Hemoglobin: 9.5 g/dL — ABNORMAL LOW (ref 12.0–15.0)
Lymphocytes Relative: 36 % (ref 12–46)
Lymphs Abs: 2.2 10*3/uL (ref 0.7–4.0)
MCH: 20.7 pg — ABNORMAL LOW (ref 26.0–34.0)
MCHC: 28.1 g/dL — ABNORMAL LOW (ref 30.0–36.0)
MCV: 73.6 fL — ABNORMAL LOW (ref 78.0–100.0)
MPV: 11.5 fL (ref 8.6–12.4)
Monocytes Absolute: 0.3 10*3/uL (ref 0.1–1.0)
Monocytes Relative: 5 % (ref 3–12)
NEUTROS ABS: 2.9 10*3/uL (ref 1.7–7.7)
Neutrophils Relative %: 48 % (ref 43–77)
Platelets: 215 10*3/uL (ref 150–400)
RBC: 4.59 MIL/uL (ref 3.87–5.11)
RDW: 15.8 % — ABNORMAL HIGH (ref 11.5–15.5)
WBC: 6 10*3/uL (ref 4.0–10.5)

## 2014-10-28 LAB — ANEMIA PANEL
%SAT: 5 % — ABNORMAL LOW (ref 20–55)
ABS Retic: 55.1 10*3/uL (ref 19.0–186.0)
FERRITIN: 5 ng/mL — AB (ref 10–291)
Folate: 11.4 ng/mL
IRON: 22 ug/dL — AB (ref 42–145)
RBC.: 4.59 MIL/uL (ref 3.87–5.11)
Retic Ct Pct: 1.2 % (ref 0.4–2.3)
TIBC: 407 ug/dL (ref 250–470)
UIBC: 385 ug/dL (ref 125–400)
VITAMIN B 12: 541 pg/mL (ref 211–911)

## 2014-10-28 MED ORDER — INSULIN ISOPHANE HUMAN 100 UNIT/ML KWIKPEN: PEN_INJECTOR | SUBCUTANEOUS | Status: DC

## 2014-10-28 NOTE — Progress Notes (Signed)
[name], female   DOB: 1970/05/12, 44 y.o.   MRN: [id number]     Subjective:   [name] female   DOB: October 19, 1970 44 y.o.   MRN: [id number]  HPI: Jennifer [name] is a 44 y.o. 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   She is accompanied by Fish farm manager.   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 She reports being seen about 3 weeks by her ophthalmologist and is to have eventual enucleation surgery once her blood sugars improve. She continues to have left eye pain, tearing, and photosensitivity. She is compliant with using prescribed eye drops.   Her last A1c was 11 on 09/14/14. She saw [name] Plyler on 6/27 and was instructed to decrease her Humulin PM dose from 40 U to 35 U and continue 50 U in the AM. She is also on metformin 1000 mg BID which she has not been taking regularly. She checks her blood sugar twice daily and has brought in her meter which unfortunately only has 2 readings after recent changes which reveals CBG in 300's and 100's. She has symptomatic hypoglycemia and reports not skipping meals. She has chronic polydipsia and polyuria but denies polyphagia or foot ulceration/injury. Her chronic peripheral neuropathy is well-controlled on gabapentin 300 mg TID. She is no longer on statin therapy as she was told she no longer needed to be on it. She tries to follow a healthy diet and stay active. She has lost 3 lb since last visit one month ago.   She has history of microcytic anemia with last ferritin level of 4 on 02/23/08. She denies menorrhagia, melena, hematochezia, or hematuria. She is not on blood thinners or iron supplementation.  Pelvic/vaginal [place] on 03/28/13 did not reveal evidence of uterine fibroids.  She denies family history of colon cancer.     Past Medical History  Diagnosis Date  . Routine/ritual circumcision   . Anemia, iron deficiency   . Glaucoma associated with ocular inflammation   . Uveitis   . Decreased visual acuity     Left eye  . Hair loss   . Hyperlipidemia   . Pap smear abnormality of cervix with LGSIL   . Type II diabetes mellitus   . Diabetes mellitus without complication   . Hypercholesteremia   . Blindness of left eye    Current Outpatient Prescriptions  Medication Sig Dispense Refill  . atorvastatin (LIPITOR) 20 MG tablet Take 20 mg by mouth daily.    . BD PEN NEEDLE NANO U/F 32G X 4 MM MISC USE TO GIVE INSULIN TWICE A DAY 100 each 0  . brimonidine (ALPHAGAN P) 0.1 % SOLN Place 1 drop into the left eye every 8 (eight) hours.    . brimonidine-timolol (COMBIGAN) 0.2-0.5 % ophthalmic solution Place 1 drop into the left eye every 12 (twelve) hours.    . dorzolamide-timolol (COSOPT) 22.3-6.8 MG/ML ophthalmic solution Place 1 drop into the left eye 2 (two) times daily.    Marland Kitchen gabapentin (NEURONTIN) 300 MG capsule Take 1 capsule (300 mg total) by mouth 3 (three) times daily. 90 capsule 2  . glucose blood (ACCU-CHEK SMARTVIEW) test strip Check Blood Sugar 4 times daily before meals and at bedtime. Dx code: E11.321 150 each 6  . Insulin NPH, Human,, Isophane, (HUMULIN N  KWIKPEN) 100 UNIT/ML Kiwkpen Take 50 units in the morning,and 40 units in the evening. 15 mL 6  . Insulin Syringe-Needle U-100 (RELION INSULIN SYRINGE 1ML/31G) 31G X 5/16" 1 ML MISC 1 each by Does not apply route daily. 100 each 11  . Lancets (ACCU-CHEK SOFT TOUCH) lancets Check Blood Sugar 4 times daily before meals and at bedtime. Dx code: E11.321 100 each 11  . metFORMIN (GLUCOPHAGE) 1000 MG tablet Take 1 tablet (1,000 mg total) by mouth 2 (two) times daily with a meal. 60 tablet 11  . prednisoLONE acetate (PRED FORTE) 1 % ophthalmic suspension Place 1 drop into the right eye 2 (two) times daily.       . Skin Protectants, Misc. (EUCERIN) cream Apply topically as needed for wound care. 397 g 0   No current facility-administered medications for this visit.   No family history on file. History   Social History  . Marital Status: Married    Spouse Name: N/A  . Number of Children: N/A  . Years of Education: N/A   Social History Main Topics  . Smoking status: Never Smoker   . Smokeless tobacco: Not on file  . Alcohol Use: No  . Drug Use: No  . Sexual Activity: Not Currently   Other Topics Concern  . None   Social History Narrative   ** Merged History Encounter **       Pt. plans to sue [employer] for testing supplies [phone] [name] July 28,2010 2:10PM      Pt is [place].    Review of Systems: Review of Systems  Constitutional: Positive for weight loss. Negative for fever and chills.  Eyes: Positive for blurred vision (chronic ), photophobia, pain (left ) and discharge (watery from left eye).  Respiratory: Negative for cough, shortness of breath and wheezing.   Cardiovascular: Negative for chest pain and leg swelling.  Gastrointestinal: Negative for nausea, vomiting, abdominal pain, diarrhea, constipation, blood in stool and melena.  Genitourinary: Negative for dysuria, urgency, frequency and hematuria.       Chronic polyuria  Musculoskeletal: Negative for myalgias.  Neurological: Positive for sensory change (chronic peripheral neuropathy ). Negative for dizziness and headaches.  Endo/Heme/Allergies: Polydipsia: chronic.    Objective:  Physical Exam: Filed Vitals:   10/28/14 1549  BP: 133/68  Pulse: 79  Temp: 98.1 F (36.7 C)  TempSrc: Oral  Weight: 202 lb 3.2 oz (91.717 kg)  SpO2: 100%     Physical Exam  Constitutional: She is oriented to person, place, and time. She appears well-developed and well-nourished. No distress.  HENT:  Head: Normocephalic and atraumatic.  Right Ear: External ear normal.  Left Ear: External ear normal.  Nose:  Nose normal.  Mouth/Throat: Oropharynx is clear and moist. No oropharyngeal exudate.  Eyes: Conjunctivae and EOM are normal. Right eye exhibits no discharge. Left eye exhibits discharge (watery). No scleral icterus.  Photosensitivity, has sunglasses on  Neck: Normal range of motion. Neck supple.  Cardiovascular: Normal rate, regular rhythm and normal heart sounds.   No murmur heard. Pulmonary/Chest: Effort normal and breath sounds normal. No respiratory distress. She has no wheezes. She has no rales.  Abdominal: Soft. Bowel sounds are normal. She exhibits no distension. There is no tenderness. There is no rebound and no guarding.  Musculoskeletal: Normal range of motion. She exhibits no edema or tenderness.  Neurological: She is alert and oriented to person, place, and time.  Skin: Skin is warm and dry. No rash noted. She is  not diaphoretic. No erythema. No pallor.  Psychiatric: She has a normal mood and affect. Her behavior is normal. Judgment and thought content normal.    Assessment & Plan:   Please see problem list for problem-based assessment and plan

## 2014-10-28 NOTE — Patient Instructions (Signed)
-  Keep taking metformin 1000 mg twice a day and insulin 50 U in the morning and 35 U in the evening -Will check your bloodwork today and call you with the results  -Please come back in 3 weeks and bring your meter, make sure you check your blood glucose two times daily -Very nice meeting you!   General Instructions:   Thank you for bringing your medicines today. This helps [place] keep you safe from mistakes.   Progress Toward Treatment Goals:  Treatment Goal 09/23/2014  Hemoglobin A1C improved    Self Care Goals & Plans:  Self Care Goal 10/28/2014  Manage my medications take my medicines as prescribed; bring my medications to every visit; refill my medications on time; follow the sick day instructions if I am sick  Monitor my health keep track of my blood glucose; bring my glucose meter and log to each visit; keep track of my weight; check my feet daily  Eat healthy foods eat more vegetables; eat fruit for snacks and desserts; eat baked foods instead of fried foods; eat smaller portions; drink diet soda or water instead of juice or soda  Be physically active find an activity I enjoy    Home Blood Glucose Monitoring 09/23/2014  Check my blood sugar 4 times a day  When to check my blood sugar before meals; at bedtime     Care Management & Community Referrals:  Referral 01/28/2013  Referrals made for care management support none needed

## 2014-10-29 LAB — HIV ANTIBODY (ROUTINE TESTING W REFLEX): HIV 1&2 Ab, 4th Generation: NONREACTIVE

## 2014-10-29 LAB — VITAMIN D 25 HYDROXY (VIT D DEFICIENCY, FRACTURES): Vit D, 25-Hydroxy: 15 ng/mL — ABNORMAL LOW (ref 30–100)

## 2014-10-30 DIAGNOSIS — E559 Vitamin D deficiency, unspecified: Secondary | ICD-10-CM | POA: Insufficient documentation

## 2014-10-30 MED ORDER — VITAMIN D (ERGOCALCIFEROL) 1.25 MG (50000 UNIT) PO CAPS: 50000.0000 [IU] | ORAL_CAPSULE | ORAL | Status: DC

## 2014-10-30 MED ORDER — FERROUS SULFATE 325 (65 FE) MG PO TABS: 325.0000 mg | ORAL_TABLET | Freq: Every day | ORAL | Status: DC

## 2014-10-30 NOTE — Assessment & Plan Note (Addendum)
Assessment: Pt with last A1c 11 on 09/14/14 partially compliant with oral hypoglycemic therapy and insulin therapy with recent symptomatic hypoglycemia who presents with blood glucose of 270.   Plan:  -A1c 11 not at goal <7. Pt instructed to continue metformin 1000 mg BID and Humulin 50 U AM & 35 U PM. Pt to return next week with [name] and also in 3 weeks for further adjustment after more glucose readings. -BP 133/68 at goal <140/90 not on anti-hypertensive therapy -LDL 88 at goal <100, will consider starting pravastatin 40 mg daily at next visit due to LDL >70 in setting of DM (pt is in statin benefit group) -Last annual eye exam on 04/19/14 with retinopathy, pt to have left eye enucleation once blood sugars improve -Last annual foot exam on 04/13/14 -Last annual urine microalbumin test normal on 02/18/14 -BMI 32.65 not at goal <25, encourage weight loss

## 2014-10-30 NOTE — Assessment & Plan Note (Signed)
-  Obtain CMP ---> glucose 270 -Obtain screening HIV Ab ---> non-reactive

## 2014-10-30 NOTE — Assessment & Plan Note (Signed)
Assessment: Pt with no prior vitamin D level who presents with no recent fall or fracture found to have vitamin D deficiency.    Plan:  -Obtain 25-OH vitamin D level ---> 15 consistent with deficiency  -Prescribe ergocalciferol 50 K U weekly for 8 weeks -Repeat 25-OH vitamin D level after completion of therapy

## 2014-10-30 NOTE — Assessment & Plan Note (Signed)
Assessment: Pt is a menstruating woman with chronic iron deficiency anemia with no evidence of uterine fibroids not on iron supplementation most likely due to menorrhagia who presents with no active bleeding or hemodynamic instability.   Plan:  -Obtain CBC w/diff ---> Hg 9.5 near baseline 10 -Obtain anemia panel ----> ferritin low at 5, low iron 22, and Tstat 7% consistent with IDA -Start ferrous sulfate 325 mg daily, consider IV iron if pt cannot tolerate therapy  -Consider stool card testing at next visit

## 2014-10-31 ENCOUNTER — Ambulatory Visit (INDEPENDENT_AMBULATORY_CARE_PROVIDER_SITE_OTHER): Payer: Medicaid Other | Admitting: Dietician

## 2014-10-31 ENCOUNTER — Encounter: Payer: Self-pay | Admitting: Dietician

## 2014-10-31 VITALS — Wt 202.9 lb

## 2014-10-31 DIAGNOSIS — E119 Type 2 diabetes mellitus without complications: Secondary | ICD-10-CM | POA: Diagnosis not present

## 2014-10-31 DIAGNOSIS — Z713 Dietary counseling and surveillance: Secondary | ICD-10-CM | POA: Diagnosis not present

## 2014-10-31 DIAGNOSIS — E113219 Type 2 diabetes mellitus with mild nonproliferative diabetic retinopathy with macular edema, unspecified eye: Secondary | ICD-10-CM

## 2014-10-31 NOTE — Patient Instructions (Addendum)
Use orange juice fortified with Vitamin D 2 ounces with each iron pill 3 times a day.  Also, please expose your arms and face to the sun for 30 minutes as many days opf the week as possible to increase your Vitamin D.  Think about using the VGo and let us know if you want to try to use it.  [name] [phone]

## 2014-10-31 NOTE — Progress Notes (Signed)
Diabetes Self-Management Education  Visit Type:  Follow-up  Appt. Start Time: 1330 Appt. End Time: 1430  10/31/2014  Jennifer [name], identified by name and date of birth, is a 44 y.o. female with a diagnosis of Diabetes:  .  Other people present during visit:  Interpreter   ASSESSMENT She says she is trying to limit her portions and lose weight.  Weight 202 lb 14.4 oz (92.035 kg), last menstrual period 09/26/2014. Body mass index is 32.76 kg/(m^2).   Did not download meter, but tried to establish her routine since her last Friday visit:   Friday took insulin  &  Metformin =- CBG- 300(12 Pm) /CBG-245 ( 6 Pm) Saturday took 50 units insulin & metformin CBG-407(9am) /CBG-465( 1243 am) took 50 units Sunday CBG-122(935am) took 50 units & metformin/ Forgot PM insulin Monday took 40 units & metformin CBG-288 (7 am)/CBG- 363(2 Pm here in office)   Only takes 1000 mg metformin in the morning because when she takes it twice a day it causes nausea- feels "like she will throw up"    Subsequent Visit Information:  Since your last visit, have you continued or began the use of a meal plan?: Yes How many days a week are you following a meal plan?: 7 Since your last visit, have you continued or began to exercise on a consistent basis?: No Since your last visit have you continued or begun to take your medications as prescribed?: No Since your last visit have you had your blood pressure checked?: No Since your last visit are you checking your feet?: Yes How many days per week are you checking your feet?: 7 Since your last visit have you experienced any weight changes?: Loss Weight Loss (lbs): 3 Since your last visit, are you checking your blood glucose at least once a day?: Yes  Psychosocial:     Patient Belief/Attitude about Diabetes: Motivated to manage diabetes Self-care barriers: Impaired vision, English as a second language Self-management support: Doctor's office, Family, CDE  visits Other persons present: Interpreter Patient Concerns: Glycemic Control Special Needs: Large print Preferred Learning Style: Auditory, Visual, Hands on Learning Readiness: Ready  Complications:   How often do you check your blood sugar?: 1-2 times/day Fasting Blood glucose range (mg/dL): 70-129, >200 Postprandial Blood glucose range (mg/dL): >200 Number of hypoglycemic episodes per month: 0 Number of hyperglycemic episodes per week:  (6) Have you had a dilated eye exam in the past 12 months?: Yes  Diet Intake:    not addressed today except that despite it being post Ramadan festivities she still tried to eat sensible amounts, foods and on time  Exercise:   no change- ADLs  Individualized Plan for Diabetes Self-Management Training:   Learning Objective:  Patient will have a greater understanding of diabetes self-management.  Patient education plan per assessed needs and concerns is to attend individual sessions for   meal planning, medication, self monitoring  Education Topics Reviewed with Patient Today:    Assisted patient with evaluating options for improved blood sugar control. She wants to have eye surgery but cannot until she can control her blood sugars.  Reviewed patients medication for diabetes, action, purpose, timing of dose and side effects., Educated about the VGo for insulin administration     PATIENTS GOALS/Plan (Developed by the patient):  Nutrition: Follow meal plan discussed Medications: showed her the VGo for insulin administration. She verbalized basic understanding of how it works and may help her blood sugars. She wore a demo home on her  arm.   Patient Self Evaluation of Goals - Patient rates self as meeting previously set goals:   Nutrition: 50 - 75 % Medications: 50 - 75 %   Plan:   Patient Instructions  Use orange juice fortified with Vitamin D 2 ounces with each iron pill 3 times a day.  Also, please expose your arms and face to the  sun for 30 minutes as many days opf the week as possible to increase your Vitamin D.  Think about using the VGo and let us know if you want to try to use it.  [name] [phone]     Expected Outcomes:  Demonstrated interest in learning. Expect positive outcomes Education material provided: information about VGo and her AVS If problems or questions, patient to contact team via:  Phone Future DSME appointment: - 2 wks

## 2014-10-31 NOTE — Progress Notes (Signed)
[facility] Medicine Clinic Attending  Case discussed with Dr. Rabbani soon after the resident saw the patient.  We reviewed the resident's history and exam and pertinent patient test results.  I agree with the assessment, diagnosis, and plan of care documented in the resident's note.  

## 2014-11-06 ENCOUNTER — Encounter (HOSPITAL_COMMUNITY): Payer: Self-pay | Admitting: *Deleted

## 2014-11-06 ENCOUNTER — Emergency Department (HOSPITAL_COMMUNITY)
Admission: EM | Admit: 2014-11-06 | Discharge: 2014-11-06 | Disposition: A | Discharge status: Home / Self Care | Payer: Medicaid Other | Attending: Emergency Medicine | Admitting: Emergency Medicine

## 2014-11-06 DIAGNOSIS — J3489 Other specified disorders of nose and nasal sinuses: Secondary | ICD-10-CM | POA: Insufficient documentation

## 2014-11-06 DIAGNOSIS — Z794 Long term (current) use of insulin: Secondary | ICD-10-CM | POA: Diagnosis not present

## 2014-11-06 DIAGNOSIS — Z872 Personal history of diseases of the skin and subcutaneous tissue: Secondary | ICD-10-CM | POA: Diagnosis not present

## 2014-11-06 DIAGNOSIS — D509 Iron deficiency anemia, unspecified: Secondary | ICD-10-CM | POA: Diagnosis not present

## 2014-11-06 DIAGNOSIS — Z79899 Other long term (current) drug therapy: Secondary | ICD-10-CM | POA: Diagnosis not present

## 2014-11-06 DIAGNOSIS — R51 Headache: Secondary | ICD-10-CM | POA: Diagnosis present

## 2014-11-06 DIAGNOSIS — G44209 Tension-type headache, unspecified, not intractable: Secondary | ICD-10-CM | POA: Diagnosis not present

## 2014-11-06 DIAGNOSIS — H5442 Blindness, left eye, normal vision right eye: Secondary | ICD-10-CM | POA: Diagnosis not present

## 2014-11-06 DIAGNOSIS — E119 Type 2 diabetes mellitus without complications: Secondary | ICD-10-CM | POA: Diagnosis not present

## 2014-11-06 DIAGNOSIS — H209 Unspecified iridocyclitis: Secondary | ICD-10-CM | POA: Insufficient documentation

## 2014-11-06 DIAGNOSIS — H44512 Absolute glaucoma, left eye: Secondary | ICD-10-CM | POA: Diagnosis not present

## 2014-11-06 DIAGNOSIS — H5789 Other specified disorders of eye and adnexa: Secondary | ICD-10-CM

## 2014-11-06 MED ORDER — FLUORESCEIN SODIUM 1 MG OP STRP
1.0000 | ORAL_STRIP | Freq: Once | OPHTHALMIC | Status: AC
  Administered 2014-11-06: 1 via OPHTHALMIC
  Filled 2014-11-06: qty 1

## 2014-11-06 MED ORDER — TETRACAINE HCL 0.5 % OP SOLN
2.0000 [drp] | Freq: Once | OPHTHALMIC | Status: AC
  Administered 2014-11-06: 2 [drp] via OPHTHALMIC
  Filled 2014-11-06: qty 2

## 2014-11-06 MED ORDER — BUTALBITAL-APAP-CAFFEINE 50-325-40 MG PO TABS: 1.0000 | ORAL_TABLET | Freq: Four times a day (QID) | ORAL | Status: DC | PRN

## 2014-11-06 MED ORDER — BUTALBITAL-APAP-CAFFEINE 50-325-40 MG PO TABS
2.0000 | ORAL_TABLET | Freq: Once | ORAL | Status: AC
  Administered 2014-11-06: 2 via ORAL
  Filled 2014-11-06: qty 2

## 2014-11-06 NOTE — ED Notes (Signed)
Pt unable to open/see out of the left eye. Attempted visual acuity with minimal results. Pts. Daughter Engineer, maintenance (IT)) states pt says left eye is blurry if able to open at all.

## 2014-11-06 NOTE — ED Provider Notes (Signed)
CSN: [id number]     Arrival date & time 11/06/14  0121 History  This chart was scribed for Linton Flemings, MD by Eustaquio Maize, ED Scribe. This patient was seen in room B15C/B15C and the patient's care was started at 3:39 AM.  Chief Complaint  Patient presents with  . Headache   The history is provided by the patient and a relative. The history is limited by a language barrier. No language interpreter was used.     HPI Comments: Jennifer [name] is a 44 y.o. 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 Pt saw ophthalmologist 2 days ago at [facility] for similar eye symptoms. Daughter states that pt believes that her left eye has worsened since being seen by ophthalmolgoist. She describes it as a gritty sensation in her eyes and believes there is something inside her left eye. Pt would like her eye looked at in the ED to see if there is anything inside the eye. Pt also complains of intermittent headaches x 2 days ago, worsening today. She has taken Tylenol and Advil without relief. Pt describes the headache as throbbing in sensation. It is localized to the top of her head and in her sinuses. She is unable to touch the top of her head due to the pain. Pt has never had symptoms like this in the past. Pt denies hx of chicken pox. Denies any other associated symptoms.    Past Medical History  Diagnosis Date  . Routine/ritual circumcision   . Anemia, iron deficiency   . Glaucoma associated with ocular inflammation   . Uveitis   . Decreased visual acuity     Left eye  . Hair loss   . Hyperlipidemia   . Pap smear abnormality of cervix with LGSIL   . Type II diabetes mellitus   . Diabetes mellitus without complication   . Hypercholesteremia   . Blindness of left eye    Past Surgical History  Procedure Laterality Date  . Cesarean  section     No family history on file. History  Substance Use Topics  . Smoking status: Never Smoker   . Smokeless tobacco: Not on file  . Alcohol Use: No   OB History    Gravida Para Term Preterm AB TAB SAB Ectopic Multiple Living   7 4 4  3  3   4      Review of Systems  HENT: Positive for sinus pressure.   Eyes: Positive for pain and visual disturbance.  Neurological: Positive for headaches.  All other systems reviewed and are negative.     Allergies  Review of patient's allergies indicates no known allergies.  Home Medications   Prior to Admission medications   Medication Sig Start Date End Date Taking? Authorizing Provider  atropine 1 % ophthalmic solution Place 2 drops into both eyes 2 (two) times daily.   Yes Historical Provider, MD  dorzolamide-timolol (COSOPT) 22.3-6.8 MG/ML ophthalmic solution Place 1 drop into the left eye 2 (two) times daily.   Yes Historical Provider, MD  ferrous sulfate 325 (65 FE) MG tablet Take 1 tablet (325 mg total) by mouth daily. 10/30/14 10/30/15 Yes Marjan Rabbani, MD  gabapentin (NEURONTIN) 300 MG capsule Take 1 capsule (300 mg total) by mouth 3 (three) times daily. 09/14/14 09/14/15 Yes Jerrye Noble, MD  Insulin  NPH, Human,, Isophane, (HUMULIN N KWIKPEN) 100 UNIT/ML Kiwkpen Take 50 units in the morning and 35 units in the evening. 10/28/14  Yes Juluis Mire, MD  metFORMIN (GLUCOPHAGE) 1000 MG tablet Take 1 tablet (1,000 mg total) by mouth 2 (two) times daily with a meal. Patient taking differently: Take 1,000 mg by mouth daily with breakfast.  04/13/14 04/13/15 Yes Jerrye Noble, MD  Vitamin D, Ergocalciferol, (DRISDOL) 50000 UNITS CAPS capsule Take 1 capsule (50,000 Units total) by mouth every 7 (seven) days. 10/30/14  Yes Marjan Rabbani, MD  BD PEN NEEDLE NANO U/F 32G X 4 MM MISC USE TO GIVE INSULIN TWICE A DAY 10/21/14   Marjan Rabbani, MD  glucose blood (ACCU-CHEK SMARTVIEW) test strip Check Blood Sugar 4 times daily before meals and at  bedtime. Dx code: I26.415 09/23/14   Milagros Loll, MD  Insulin Syringe-Needle U-100 (RELION INSULIN SYRINGE 1ML/31G) 31G X 5/16" 1 ML MISC 1 each by Does not apply route daily. 06/25/13   Jerrye Noble, MD  Lancets (ACCU-CHEK SOFT TOUCH) lancets Check Blood Sugar 4 times daily before meals and at bedtime. Dx code: A30.940 09/23/14   Milagros Loll, MD  Skin Protectants, Misc. (EUCERIN) cream Apply topically as needed for wound care. 09/23/14   Milagros Loll, MD   Triage Vitals: BP 154/68 mmHg  Pulse 82  Temp(Src) 98.4 F (36.9 C)  Resp 18  Ht 5\' 6"  (1.676 m)  Wt 202 lb 8 oz (91.853 kg)  BMI 32.70 kg/m2  SpO2 98%  LMP 10/26/2014   Physical Exam  Constitutional: She is oriented to person, place, and time. She appears well-developed and well-nourished.  HENT:  Head: Normocephalic and atraumatic.  Right Ear: External ear normal.  Left Ear: External ear normal.  Nose: Nose normal.  Mouth/Throat: Oropharynx is clear and moist.  Patient has tenderness with palpation of the anterior and posterior scalp mainly on the left side.  No lesions noted scalp or face  Eyes: Conjunctivae and EOM are normal. Pupils are equal, round, and reactive to light.  Left eye without vision, cloudy cornea.  I was stained with floor seen.  No uptake noted.  Patient does report resolution of symptoms with instillation of tetracaine.  Neck: Normal range of motion. Neck supple. No JVD present. No tracheal deviation present. No thyromegaly present.  Cardiovascular: Normal rate, regular rhythm, normal heart sounds and intact distal pulses.  Exam reveals no gallop and no friction rub.   No murmur heard. Pulmonary/Chest: Effort normal and breath sounds normal. No stridor. No respiratory distress. She has no wheezes. She has no rales. She exhibits no tenderness.  Abdominal: Soft. Bowel sounds are normal. She exhibits no distension and no mass. There is no tenderness. There is no rebound and no guarding.  Musculoskeletal:  Normal range of motion. She exhibits no edema or tenderness.  Lymphadenopathy:    She has no cervical adenopathy.  Neurological: She is alert and oriented to person, place, and time. She displays normal reflexes. No cranial nerve deficit. She exhibits normal muscle tone. Coordination normal.  Skin: Skin is warm and dry. No rash noted. No erythema. No pallor.  Psychiatric: She has a normal mood and affect. Her behavior is normal. Judgment and thought content normal.  Nursing note and vitals reviewed.  ED Course  Procedures (including critical care time)  DIAGNOSTIC STUDIES: Oxygen Saturation is 98% on RA, normal by my interpretation.    COORDINATION OF CARE: 3:52 AM-Discussed treatment plan which includes flouroscein  test with pt at bedside and pt agreed to plan.   Labs Review Labs Reviewed - No data to display  Imaging Review No results found.   EKG Interpretation None      MDM   Final diagnoses:  Irritation of left eye  Absolute glaucoma, left eye  Tension-type headache, not intractable, unspecified chronicity pattern   I personally performed the services described in this documentation, which was scribed in my presence. The recorded information has been reviewed and is accurate.  44 year old female resents to the emergency department with 2 days of headache which is throbbing in nature over the left side of the face and the scalp.  Patient also complaining of a gritty sensation to her left eye which has been ongoing for some time.  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  It appears that they are planning for enucleation of the eye.  Once her blood sugars are under better control.  Differential includes early herpes zoster, tension headache, headache secondary to worsening glaucoma.  Patient reports that she has been compliant with her eyedrops and saw her eye doctor 2 days ago.  Neuro exam is normal.  No lesions noted.   Patient feeling better after Fioricet.  I was stained, no uptake noted, but patient reports instant relief after tetracaine placed in the eye.  Patient to follow-up with primary care doctor and ophthalmologist    Linton Flemings, MD 11/06/14 [id number]

## 2014-11-06 NOTE — Discharge Instructions (Signed)
General Headache Without Cause A headache is pain or discomfort felt around the head or neck area. The specific cause of a headache may not be found. There are many causes and types of headaches. A few common ones are:  Tension headaches.  Migraine headaches.  Cluster headaches.  Chronic daily headaches. HOME CARE INSTRUCTIONS   Keep all follow-up appointments with your caregiver or any specialist referral.  Only take over-the-counter or prescription medicines for pain or discomfort as directed by your caregiver.  Lie down in a dark, quiet room when you have a headache.  Keep a headache journal to find out what may trigger your migraine headaches. For example, write down:  What you eat and drink.  How much sleep you get.  Any change to your diet or medicines.  Try massage or other relaxation techniques.  Put ice packs or heat on the head and neck. Use these 3 to 4 times per day for 15 to 20 minutes each time, or as needed.  Limit stress.  Sit up straight, and do not tense your muscles.  Quit smoking if you smoke.  Limit alcohol use.  Decrease the amount of caffeine you drink, or stop drinking caffeine.  Eat and sleep on a regular schedule.  Get 7 to 9 hours of sleep, or as recommended by your caregiver.  Keep lights dim if bright lights bother you and make your headaches worse. SEEK MEDICAL CARE IF:   You have problems with the medicines you were prescribed.  Your medicines are not working.  You have a change from the usual headache.  You have nausea or vomiting. SEEK IMMEDIATE MEDICAL CARE IF:   Your headache becomes severe.  You have a fever.  You have a stiff neck.  You have loss of vision.  You have muscular weakness or loss of muscle control.  You start losing your balance or have trouble walking.  You feel faint or pass out.  You have severe symptoms that are different from your first symptoms. MAKE SURE YOU:   Understand these  instructions.  Will watch your condition.  Will get help right away if you are not doing well or get worse. Document Released: 04/08/2005 Document Revised: 07/01/2011 Document Reviewed: 04/24/2011 [facility] Patient Information 2015 [place], [place]. This information is not intended to replace advice given to you by your health care provider. Make sure you discuss any questions you have with your health care provider.   Glaucoma Glaucoma happens when the fluid pressure in the eyeball is too high. The pressure cannot stay high for too long, or the eyeball may become damaged. Signs of glaucoma include:  Having a hard time seeing in a dark room after being in a bright one.  Having trouble seeing things out to the sides of you.  Blurry sight.  Seeing bright white lights or colors in front of your eyes.  Headaches.  Feeling sick to your stomach (nauseous) or throwing up (vomiting).  Sudden vision loss. Glaucoma testing is an important part of taking care of your eyesight. HOME CARE  Always use your eyedrops or pills as told by your doctor. Do not run out.  Do not go away from home without your eyedrops or pills.  Keep your appointments.  Always tell a new doctor that you have glaucoma and how long you have had it. Tell the doctor about the eyedrops and pills you take.  Do not use eyedrops or pills that have not been prescribed by your doctor. GET  HELP RIGHT AWAY IF:  You develop severe pain in the affected eye.  You develop vision problems.  You develop a bad headache in the area around the eye.  You feel sick to your stomach or throw up.  You start to have problems with the other eye. MAKE SURE YOU:   Understand these instructions.  Will watch your condition.  Will get help right away if you are not doing well or get worse. Document Released: 01/16/2008 Document Revised: 07/01/2011 Document Reviewed: 01/16/2008 [facility] Patient Information 2015 [place], [place]. This  information is not intended to replace advice given to you by your health care provider. Make sure you discuss any questions you have with your health care provider.

## 2014-11-06 NOTE — ED Notes (Signed)
Pt stable, ambulatory, states understanding of discharge instructions 

## 2014-11-06 NOTE — ED Notes (Signed)
The pt is c/o a headache for months.  Pain in her lt eye.  No n v or diarrhea.  She sleeps  More lmp June 15th

## 2014-11-14 ENCOUNTER — Ambulatory Visit (INDEPENDENT_AMBULATORY_CARE_PROVIDER_SITE_OTHER): Payer: Medicaid Other | Admitting: Dietician

## 2014-11-14 ENCOUNTER — Other Ambulatory Visit: Payer: Self-pay | Admitting: Dietician

## 2014-11-14 ENCOUNTER — Encounter: Payer: Self-pay | Admitting: Dietician

## 2014-11-14 DIAGNOSIS — Z713 Dietary counseling and surveillance: Secondary | ICD-10-CM

## 2014-11-14 DIAGNOSIS — E113219 Type 2 diabetes mellitus with mild nonproliferative diabetic retinopathy with macular edema, unspecified eye: Secondary | ICD-10-CM

## 2014-11-14 DIAGNOSIS — E119 Type 2 diabetes mellitus without complications: Secondary | ICD-10-CM | POA: Diagnosis not present

## 2014-11-14 NOTE — Progress Notes (Signed)
Diabetes Self-Management Education  Visit Type:  Follow-up  Appt. Start Time: 1535 Appt. End Time: 1700  11/14/2014  [name], identified by name and date of birth, is a 44 y.o. female with a diagnosis of Diabetes: Type 2  .  Other people present during visit:  Interpreter   ASSESSMENT She says she is trying to limit her portions and lose weight.  Last menstrual period 10/26/2014. There is no weight on file to calculate BMI.  Downloaded meter, Blood sugars very variable, 86-465 with standard deviation of 104.   Takes 1000 mg metformin in the morning because when she takes it twice a day it causes nausea- feels "like she will throw up"   Subsequent Visit Information:  Since your last visit, have you continued or began the use of a meal plan?: Yes Since your last visit, have you continued or began to exercise on a consistent basis?: No Since your last visit have you continued or begun to take your medications as prescribed?: Yes Since your last visit have you had your blood pressure checked?: No Since your last visit are you checking your feet?: Yes Since your last visit have you experienced any weight changes?: Loss Weight Loss (lbs): 1 Since your last visit, are you checking your blood glucose at least once a day?: Yes  Psychosocial:   Patient Belief/Attitude about Diabetes: Motivated to manage diabetes Self-care barriers: Impaired vision, English as a second language Self-management support: Doctor's office, Family, CDE visits Other persons present: Interpreter Patient Concerns: Glycemic Control Special Needs: Large print Preferred Learning Style: Auditory, Hands on Learning Readiness: Ready  Complications:     Retinopathy, neuropathy  Diet Intake:    not addressed today except to take 2 clicks before each meal, patient asked about taking a click before snacks and was advised for now not to take clicks for snacks  Exercise:   no change-  ADLs  Individualized Plan for Diabetes Self-Management Training:   Learning Objective:  Patient will have a greater understanding of diabetes self-management.  Patient education plan per assessed needs and concerns is to attend individual sessions for   meal planning, medication, self monitoring, acute compoications  Education Topics Reviewed with Patient Today:    Assisted patient with filling and putting on a VGo 20 per dr. Harley Hallmark orders.  Reviewed prefilling instruction, shypoglycemia, Educated about the VGo for insulin administration Purpose and frequency of SMBG.   PATIENTS GOALS/Plan (Developed by the patient):  Nutrition: Follow meal plan discussed Medications: showed her the VGo for insulin administration. She verbalized basic understanding of how it works and may help her blood sugars. She wore a demo home on her arm.   Patient Self Evaluation of Goals - Patient rates self as meeting previously set goals:   Nutrition: 50 - 75 % Medications: 50 - 75 %   Plan:   Patient Instructions  Stop the NPH insulin   Check blood sugar be fore dinner and bedtime tonight.   Fill new Vgo with Novolog insulin tomorrow. Can fill 5 at a time and put in the refrigerator with the VGo fill and insulin.   Take old Vgo off and put new one on about the same time each day.   Continue to check your blood sugar 4 times a day be ore meal and bedtime and record on paper provided.   You will need 2 vial of Novolog insulin to fill enough Vgo insulin patches for 1 month.   [facility] in [place] should  be delivering your box of Vgos   Expected Outcomes:  Demonstrated limited interest in learning.  Expect minimal changes, Demonstrated interest in learning. Expect positive outcomes Education material provided: information about VGo and her AVS If problems or questions, patient to contact team via:  Phone Future DSME appointment: - 1 week

## 2014-11-14 NOTE — Patient Instructions (Signed)
Stop the NPH insulin   Check blood sugar be fore dinner and bedtime tonight.   Fill new Vgo with Novolog insulin tomorrow. Can fill 5 at a time and put in the refrigerator with the VGo fill and insulin.   Take old Vgo off and put new one on about the same time each day.   Continue to check your blood sugar 4 times a day be ore meal and bedtime and record on paper provided.   You will need 2 vial of Novolog insulin to fill enough Vgo insulin patches for 1 month.   [facility] in [place] should be delivering your box of Vgos

## 2014-11-14 NOTE — Progress Notes (Signed)
Patient needs 2 vials of Novolog insulin per month to fill 30 days of VGo 20s and she will also need a kit of Vgo 20s from  [facility] is closed right now so we cannot ask them how to get the order to [facility] Address: [address] [place], [place], [place] [address] Phone: [phone]

## 2014-11-15 ENCOUNTER — Encounter: Payer: Self-pay | Admitting: Dietician

## 2014-11-15 ENCOUNTER — Telehealth: Payer: Self-pay | Admitting: Dietician

## 2014-11-15 MED ORDER — INSULIN ASPART 100 UNIT/ML ~~LOC~~ SOLN: SUBCUTANEOUS | Status: DC

## 2014-11-15 MED ORDER — V-GO 20 KIT: PACK | Status: DC

## 2014-11-21 ENCOUNTER — Ambulatory Visit: Payer: Medicaid Other | Admitting: Dietician

## 2014-11-21 NOTE — Telephone Encounter (Signed)
Let patient know her VGo were being mailed

## 2014-11-25 ENCOUNTER — Telehealth: Payer: Self-pay | Admitting: Dietician

## 2014-11-25 ENCOUNTER — Encounter: Payer: Medicaid Other | Admitting: Internal Medicine

## 2014-11-25 ENCOUNTER — Ambulatory Visit: Payer: Medicaid Other | Admitting: Dietician

## 2014-11-25 NOTE — Telephone Encounter (Signed)
Thanks for calling her!  Dr. Naaman Plummer

## 2014-11-25 NOTE — Telephone Encounter (Signed)
Called patient using [phone] interpreter Spoke with son, Earlie Server, her son using an arabic interpreter, He says Jennifer [name] is "good, ". He does not know what her blood sugars are, but he says she is not having any problems with the Vgo that he is ware of. He agreed to have her call us if they have questions or concerns.

## 2014-11-28 ENCOUNTER — Other Ambulatory Visit: Payer: Self-pay | Admitting: Pulmonary Disease

## 2014-11-29 NOTE — Telephone Encounter (Signed)
Just ordered in June

## 2014-12-03 ENCOUNTER — Encounter (HOSPITAL_COMMUNITY): Payer: Self-pay | Admitting: Emergency Medicine

## 2014-12-03 ENCOUNTER — Emergency Department (HOSPITAL_COMMUNITY)
Admission: EM | Admit: 2014-12-03 | Discharge: 2014-12-03 | Disposition: A | Discharge status: Other Acute Inpatient Hospital | Payer: Medicaid Other | Attending: Emergency Medicine | Admitting: Emergency Medicine

## 2014-12-03 DIAGNOSIS — E119 Type 2 diabetes mellitus without complications: Secondary | ICD-10-CM | POA: Insufficient documentation

## 2014-12-03 DIAGNOSIS — Y929 Unspecified place or not applicable: Secondary | ICD-10-CM | POA: Insufficient documentation

## 2014-12-03 DIAGNOSIS — H40212 Acute angle-closure glaucoma, left eye: Secondary | ICD-10-CM

## 2014-12-03 DIAGNOSIS — H578 Other specified disorders of eye and adnexa: Secondary | ICD-10-CM | POA: Diagnosis present

## 2014-12-03 DIAGNOSIS — S0502XA Injury of conjunctiva and corneal abrasion without foreign body, left eye, initial encounter: Secondary | ICD-10-CM | POA: Diagnosis not present

## 2014-12-03 DIAGNOSIS — X58XXXA Exposure to other specified factors, initial encounter: Secondary | ICD-10-CM | POA: Diagnosis not present

## 2014-12-03 DIAGNOSIS — Z79899 Other long term (current) drug therapy: Secondary | ICD-10-CM | POA: Diagnosis not present

## 2014-12-03 DIAGNOSIS — R51 Headache: Secondary | ICD-10-CM | POA: Insufficient documentation

## 2014-12-03 DIAGNOSIS — D509 Iron deficiency anemia, unspecified: Secondary | ICD-10-CM | POA: Diagnosis not present

## 2014-12-03 DIAGNOSIS — Y999 Unspecified external cause status: Secondary | ICD-10-CM | POA: Diagnosis not present

## 2014-12-03 DIAGNOSIS — Y939 Activity, unspecified: Secondary | ICD-10-CM | POA: Diagnosis not present

## 2014-12-03 DIAGNOSIS — Z872 Personal history of diseases of the skin and subcutaneous tissue: Secondary | ICD-10-CM | POA: Diagnosis not present

## 2014-12-03 DIAGNOSIS — Z794 Long term (current) use of insulin: Secondary | ICD-10-CM | POA: Diagnosis not present

## 2014-12-03 DIAGNOSIS — H5442 Blindness, left eye, normal vision right eye: Secondary | ICD-10-CM | POA: Insufficient documentation

## 2014-12-03 DIAGNOSIS — H409 Unspecified glaucoma: Secondary | ICD-10-CM | POA: Diagnosis not present

## 2014-12-03 MED ORDER — FLUORESCEIN SODIUM 1 MG OP STRP
1.0000 | ORAL_STRIP | Freq: Once | OPHTHALMIC | Status: AC
  Administered 2014-12-03: 1 via OPHTHALMIC
  Filled 2014-12-03: qty 1

## 2014-12-03 MED ORDER — OXYCODONE-ACETAMINOPHEN 5-325 MG PO TABS
1.0000 | ORAL_TABLET | Freq: Once | ORAL | Status: AC
  Administered 2014-12-03: 1 via ORAL
  Filled 2014-12-03: qty 1

## 2014-12-03 MED ORDER — TETRACAINE HCL 0.5 % OP SOLN
2.0000 [drp] | Freq: Once | OPHTHALMIC | Status: AC
  Administered 2014-12-03: 2 [drp] via OPHTHALMIC
  Filled 2014-12-03: qty 2

## 2014-12-03 NOTE — ED Notes (Addendum)
C/o L eye pain and redness since 4pm yesterday.  States it feels like something in eye.

## 2014-12-03 NOTE — ED Provider Notes (Signed)
TIME SEEN: 4:30 AM  CHIEF COMPLAINT: Left eye pain, tearing, redness  HPI: Pt is a 44 y.o. female with history of hyperlipidemia, diabetes, glaucoma, blindness of the left eye who presents to the emergency department with complaints of eye redness, tearing, pain started yesterday afternoon. States that she feels like dust got in her eye yesterday. Denies any other injury to the eye. She does have a headache. No nausea or vomiting. Has had similar symptoms intermittently since 2008. Son reports that patient always has some pain that feels like it has acutely worsened and is now red which is not normal. No fever.  Patient's ophthalmologist is Dr. Edilia Bo at [facility].   Patient son interpreted as patient speaks Arabic but understands [place].  ROS: See HPI Constitutional: no fever  Eyes: no drainage  ENT: no runny nose   Cardiovascular:  no chest pain  Resp: no SOB  GI: no vomiting GU: no dysuria Integumentary: no rash  Allergy: no hives  Musculoskeletal: no leg swelling  Neurological: no slurred speech ROS otherwise negative  PAST MEDICAL HISTORY/PAST SURGICAL HISTORY:  Past Medical History  Diagnosis Date  . Routine/ritual circumcision   . Anemia, iron deficiency   . Glaucoma associated with ocular inflammation   . Uveitis   . Decreased visual acuity     Left eye  . Hair loss   . Hyperlipidemia   . Pap smear abnormality of cervix with LGSIL   . Type II diabetes mellitus   . Diabetes mellitus without complication   . Hypercholesteremia   . Blindness of left eye     MEDICATIONS:  Prior to Admission medications   Medication Sig Start Date End Date Taking? Authorizing Provider  atropine 1 % ophthalmic solution Place 2 drops into both eyes 2 (two) times daily.    Historical Provider, MD  butalbital-acetaminophen-caffeine (FIORICET) 50-325-40 MG per tablet Take 1-2 tablets by mouth every 6 (six) hours as needed for headache. 11/06/14 11/06/15  Linton Flemings, MD  dorzolamide-timolol  (COSOPT) 22.3-6.8 MG/ML ophthalmic solution Place 1 drop into the left eye 2 (two) times daily.    Historical Provider, MD  ferrous sulfate 325 (65 FE) MG tablet Take 1 tablet (325 mg total) by mouth daily. 10/30/14 10/30/15  Juluis Mire, MD  gabapentin (NEURONTIN) 300 MG capsule Take 1 capsule (300 mg total) by mouth 3 (three) times daily. 09/14/14 09/14/15  Jerrye Noble, MD  glucose blood (ACCU-CHEK SMARTVIEW) test strip Check Blood Sugar 4 times daily before meals and at bedtime. Dx code: E11.321 09/23/14   Milagros Loll, MD  insulin aspart (NOVOLOG) 100 UNIT/ML injection Use  Novolog to fill one Vgo20 each day and take 2 clicks with each meal three time a day 11/15/14 11/14/15  Juluis Mire, MD  Insulin Disposable Pump (V-GO 20) KIT Use 1 V-go for 24 hrs 11/15/14   Juluis Mire, MD  Insulin Syringe-Needle U-100 (RELION INSULIN SYRINGE 1ML/31G) 31G X 5/16" 1 ML MISC 1 each by Does not apply route daily. 06/25/13   Jerrye Noble, MD  Lancets (ACCU-CHEK SOFT TOUCH) lancets Check Blood Sugar 4 times daily before meals and at bedtime. Dx code: Y09.983 09/23/14   Milagros Loll, MD  metFORMIN (GLUCOPHAGE) 1000 MG tablet Take 1 tablet (1,000 mg total) by mouth 2 (two) times daily with a meal. Patient taking differently: Take 1,000 mg by mouth daily with breakfast.  04/13/14 04/13/15  Jerrye Noble, MD  Skin Protectants, Misc. (EUCERIN) cream Apply topically as needed for  wound care. 09/23/14   Milagros Loll, MD  Vitamin D, Ergocalciferol, (DRISDOL) 50000 UNITS CAPS capsule Take 1 capsule (50,000 Units total) by mouth every 7 (seven) days. 10/30/14   Juluis Mire, MD    ALLERGIES:  No Known Allergies  SOCIAL HISTORY:  Social History  Substance Use Topics  . Smoking status: Never Smoker   . Smokeless tobacco: Not on file  . Alcohol Use: No    FAMILY HISTORY: No family history on file.  EXAM: BP 149/74 mmHg  Pulse 83  Temp(Src) 98.6 F (37 C) (Oral)  Resp 20  SpO2 97%  LMP 11/26/2014  (Approximate) CONSTITUTIONAL: Alert and oriented and responds appropriately to questions. Patient appears uncomfortable but is afebrile, nontoxic HEAD: Normocephalic EYES: Conjunctivae clear on the right side, pupil of the right eye is briskly reactive, left cornea is cloudy and conjunctiva is injected with tearing, no discharge from the left eye, patient has a small corneal abrasion without sign of ulceration, pressure in the right eye is 15, pressure in the left eye measures between 54-56 with one tonopen and 88-89 with second tonopen; unable to test visual acuity given patient is unable to understand commands ENT: normal nose; no rhinorrhea; moist mucous membranes; pharynx without lesions noted NECK: Supple, no meningismus, no LAD  CARD: RRR; S1 and S2 appreciated; no murmurs, no clicks, no rubs, no gallops RESP: Normal chest excursion without splinting or tachypnea; breath sounds clear and equal bilaterally; no wheezes, no rhonchi, no rales, no hypoxia or respiratory distress, speaking full sentences ABD/GI: Normal bowel sounds; non-distended; soft, non-tender, no rebound, no guarding, no peritoneal signs BACK:  The back appears normal and is non-tender to palpation, there is no CVA tenderness EXT: Normal ROM in all joints; non-tender to palpation; no edema; normal capillary refill; no cyanosis, no calf tenderness or swelling    SKIN: Normal color for age and race; warm NEURO: Moves all extremities equally, sensation to light touch intact diffusely, cranial nerves II through XII intact PSYCH: The patient's mood and manner are appropriate. Grooming and personal hygiene are appropriate.  MEDICAL DECISION MAKING: Patient here with acute glaucoma. She reports some improvement in pain after receiving tetracaine. She also has a corneal abrasion. She is blind in this eye and has no light perception. It appears per her last ophthalmology note in July pressure in her right eye was 13 and pressure in the  left eye was 24. Patient is on Cosopt, atropine, Pred Forte and denies missing any of her medication. Discussed with Dr. Princella Ion with ophthalmology at [facility] who agrees to see the patient in consult. No acute intervention recommended at this time. Would like an ER to ER transfer. Discussed with ER physician Dr. Bertram Savin who agrees to accept the patient in transfer. Patient and family would like to go by private vehicle. They will go straight to the hospital.      [place], DO 12/03/14 [phone]

## 2014-12-15 ENCOUNTER — Telehealth: Payer: Self-pay | Admitting: Internal Medicine

## 2014-12-15 NOTE — Telephone Encounter (Signed)
Call to patient to confirm appointment for 12/16/14 at 1:30 and 2:15 lmtcb

## 2014-12-16 ENCOUNTER — Telehealth: Payer: Self-pay | Admitting: Dietician

## 2014-12-16 ENCOUNTER — Other Ambulatory Visit: Payer: Self-pay | Admitting: Dietician

## 2014-12-16 ENCOUNTER — Ambulatory Visit (INDEPENDENT_AMBULATORY_CARE_PROVIDER_SITE_OTHER): Payer: Medicaid Other | Admitting: Internal Medicine

## 2014-12-16 ENCOUNTER — Other Ambulatory Visit: Payer: Self-pay | Admitting: Internal Medicine

## 2014-12-16 ENCOUNTER — Encounter: Payer: Self-pay | Admitting: Internal Medicine

## 2014-12-16 ENCOUNTER — Ambulatory Visit (INDEPENDENT_AMBULATORY_CARE_PROVIDER_SITE_OTHER): Payer: Medicaid Other | Admitting: Dietician

## 2014-12-16 ENCOUNTER — Ambulatory Visit: Payer: Medicaid Other | Admitting: Dietician

## 2014-12-16 ENCOUNTER — Encounter: Payer: Self-pay | Admitting: Dietician

## 2014-12-16 VITALS — BP 111/68 | HR 81 | Temp 98.3°F | Ht 66.0 in | Wt 194.5 lb

## 2014-12-16 DIAGNOSIS — E1165 Type 2 diabetes mellitus with hyperglycemia: Secondary | ICD-10-CM

## 2014-12-16 DIAGNOSIS — E113219 Type 2 diabetes mellitus with mild nonproliferative diabetic retinopathy with macular edema, unspecified eye: Secondary | ICD-10-CM

## 2014-12-16 DIAGNOSIS — E785 Hyperlipidemia, unspecified: Secondary | ICD-10-CM | POA: Diagnosis not present

## 2014-12-16 DIAGNOSIS — Z794 Long term (current) use of insulin: Secondary | ICD-10-CM

## 2014-12-16 DIAGNOSIS — E11321 Type 2 diabetes mellitus with mild nonproliferative diabetic retinopathy with macular edema: Secondary | ICD-10-CM

## 2014-12-16 DIAGNOSIS — E119 Type 2 diabetes mellitus without complications: Secondary | ICD-10-CM

## 2014-12-16 DIAGNOSIS — Z6831 Body mass index (BMI) 31.0-31.9, adult: Secondary | ICD-10-CM

## 2014-12-16 DIAGNOSIS — Z79899 Other long term (current) drug therapy: Secondary | ICD-10-CM | POA: Diagnosis not present

## 2014-12-16 DIAGNOSIS — E669 Obesity, unspecified: Secondary | ICD-10-CM

## 2014-12-16 DIAGNOSIS — E1142 Type 2 diabetes mellitus with diabetic polyneuropathy: Secondary | ICD-10-CM

## 2014-12-16 DIAGNOSIS — E559 Vitamin D deficiency, unspecified: Secondary | ICD-10-CM | POA: Diagnosis not present

## 2014-12-16 DIAGNOSIS — Z0000 Encounter for general adult medical examination without abnormal findings: Secondary | ICD-10-CM

## 2014-12-16 DIAGNOSIS — Z713 Dietary counseling and surveillance: Secondary | ICD-10-CM

## 2014-12-16 LAB — POCT GLYCOSYLATED HEMOGLOBIN (HGB A1C): Hemoglobin A1C: 12.2

## 2014-12-16 LAB — GLUCOSE, CAPILLARY: GLUCOSE-CAPILLARY: 398 mg/dL — AB (ref 65–99)

## 2014-12-16 MED ORDER — PRAVASTATIN SODIUM 40 MG PO TABS: 40.0000 mg | ORAL_TABLET | Freq: Every evening | ORAL | Status: DC

## 2014-12-16 NOTE — Telephone Encounter (Signed)
Thanks [name]!  Dr. Naaman Plummer

## 2014-12-16 NOTE — Telephone Encounter (Signed)
Called Dr Cinda Quest office to find out specifically what labs they are requesting. She was last seen in [place] in July by Dr. Edilia Bo. They said to send her recent "A1C, lipid panel and fasting glucose, etc" she said  CMET and CBC should be fine to send to [facility] in [place].  Fax to [phone]

## 2014-12-16 NOTE — Progress Notes (Signed)
[name], female   DOB: 1970/09/28, 44 y.o.   MRN: [id number]    Subjective:   [name] female   DOB: 11/20/1970 44 y.o.   MRN: [id number]  HPI: Jennifer [name] is a 44 y.o.  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   She is accompanied by Fish farm manager and her daughter   Her last A1c was 11 on 09/14/14. She was taking Vgo 20 (2 clicks before meals) for a short time but then went back to her previous insulin after dealing with family stressors and running out of it. She has also not been taking metformin 1000 mg BID regularly.  She checks her blood sugar one to three times daily and has brought her meter which reveals average blood glucose of 270 with range of 78-550. She reports symptomatic hypoglycemia about once a week. She has chronic polydipsia but denies polyuria, polyphagia, or foot ulceration/injury. Her chronic peripheral neuropathy is well-controlled on gabapentin 300 mg BID. She is not currently taking statin therapy. She tries to follow a healthy diet and walks regularly. She has lost 8 lb since last visit one month ago.   Her vitamin D level was low at 15 on 10/28/14. She reports completing high dose vitamin D therapy. She denies recent fracture, fall, or bone pain.     Past Medical History  Diagnosis Date  . Routine/ritual circumcision   . Anemia, iron deficiency   . Glaucoma associated with ocular inflammation   . Uveitis   . Decreased visual acuity     Left eye  . Hair loss   . Hyperlipidemia   . Pap smear abnormality of cervix with LGSIL   . Type II diabetes mellitus   . Diabetes mellitus without complication   . Hypercholesteremia   . Blindness of left eye    Current Outpatient Prescriptions  Medication Sig Dispense Refill  . atropine 1 % ophthalmic solution Place  2 drops into both eyes 2 (two) times daily.    . butalbital-acetaminophen-caffeine (FIORICET) 50-325-40 MG per tablet Take 1-2 tablets by mouth every 6 (six) hours as needed for headache. (Patient not taking: Reported on 12/03/2014) 20 tablet 0  . dorzolamide-timolol (COSOPT) 22.3-6.8 MG/ML ophthalmic solution Place 1 drop into the left eye 2 (two) times daily.    . ferrous sulfate 325 (65 FE) MG tablet Take 1 tablet (325 mg total) by mouth daily. (Patient not taking: Reported on 12/03/2014) 30 tablet 5  . gabapentin (NEURONTIN) 300 MG capsule Take 1 capsule (300 mg total) by mouth 3 (three) times daily. 90 capsule 2  . glucose blood (ACCU-CHEK SMARTVIEW) test strip Check Blood Sugar 4 times daily before meals and at bedtime. Dx code: E11.321 150 each 6  . insulin aspart (NOVOLOG) 100 UNIT/ML injection Use  Novolog to fill one Vgo20 each day and take 2 clicks with each meal three time a day 20 mL 11  . Insulin Disposable Pump (V-GO 20) KIT Use 1 V-go for 24 hrs 1 kit 11  . Insulin Syringe-Needle U-100 (RELION INSULIN SYRINGE 1ML/31G) 31G X 5/16" 1 ML MISC 1 each by Does not apply route daily. 100 each 11  . Lancets (ACCU-CHEK SOFT TOUCH) lancets Check Blood Sugar 4 times daily before meals and at bedtime. Dx code: E11.321 100 each 11  . metFORMIN (GLUCOPHAGE) 1000 MG tablet Take 1 tablet (1,000  mg total) by mouth 2 (two) times daily with a meal. (Patient taking differently: Take 1,000 mg by mouth daily with breakfast. ) 60 tablet 11  . Skin Protectants, Misc. (EUCERIN) cream Apply topically as needed for wound care. (Patient not taking: Reported on 12/03/2014) 397 g 0  . Vitamin D, Ergocalciferol, (DRISDOL) 50000 UNITS CAPS capsule Take 1 capsule (50,000 Units total) by mouth every 7 (seven) days. 8 capsule 0   No current facility-administered medications for this visit.   No family history on file. Social History   Social History  . Marital Status: Married    Spouse Name: N/A  . Number of  Children: N/A  . Years of Education: N/A   Social History Main Topics  . Smoking status: Never Smoker   . Smokeless tobacco: Not on file  . Alcohol Use: No  . Drug Use: No  . Sexual Activity: Not Currently   Other Topics Concern  . Not on file   Social History Narrative   ** Merged History Encounter **       Pt. plans to sue [employer] for testing supplies [phone] [name] July 28,2010 2:10PM      Pt is [place].    Review of Systems: Review of Systems  Constitutional: Positive for weight loss and malaise/fatigue.  Eyes: Positive for blurred vision and pain (left ).  Respiratory: Negative for cough, shortness of breath and wheezing.   Cardiovascular: Negative for chest pain and leg swelling.  Gastrointestinal: Negative for nausea, vomiting, abdominal pain, diarrhea, constipation and blood in stool.  Genitourinary: Negative for dysuria, urgency, frequency and hematuria.  Musculoskeletal: Negative for myalgias and falls.  Neurological: Positive for sensory change (chronic peripheral neuropathy) and headaches. Negative for dizziness.  Endo/Heme/Allergies: Positive for polydipsia (chronic).     Objective:  Physical Exam: Filed Vitals:   12/16/14 1425  BP: 111/68  Pulse: 81  Temp: 98.3 F (36.8 C)  TempSrc: Oral  Height: _0  (1.676 m)  Weight: 194 lb 8 oz (88.225 kg)  SpO2: 100%    Physical Exam  Constitutional: She is oriented to person, place, and time. She appears well-developed and well-nourished. No distress.  HENT:  Head: Normocephalic and atraumatic.  Right Ear: External ear normal.  Left Ear: External ear normal.  Nose: Nose normal.  Mouth/Throat: Oropharynx is clear and moist. No oropharyngeal exudate.  Eyes: Conjunctivae and EOM are normal. Right eye exhibits no discharge. Left eye exhibits no discharge. No scleral icterus.  Neck: Normal range of motion. Neck supple.  Cardiovascular: Normal rate, regular rhythm and normal heart sounds.    No murmur heard. Pulmonary/Chest: Effort normal and breath sounds normal. No respiratory distress. She has no wheezes. She has no rales.  Abdominal: Soft. Bowel sounds are normal. She exhibits no distension. There is no tenderness. There is no rebound and no guarding.  Musculoskeletal: Normal range of motion. She exhibits no edema or tenderness.  Neurological: She is alert and oriented to person, place, and time.  Skin: Skin is warm and dry. No rash noted. She is not diaphoretic. No erythema. No pallor.  Psychiatric: She has a normal mood and affect. Her behavior is normal. Judgment and thought content normal.    Assessment & Plan:   Please see problem list for problem-based assessment and plan

## 2014-12-16 NOTE — Telephone Encounter (Signed)
[facility] for refill history of the VGO They confirmed that they Sent out [place] on 11/15/14 and 12/15/14. Her first shipment  was put on patients front porch by the front door and she should receive her second shipment of [place] tomorrow.

## 2014-12-16 NOTE — Patient Instructions (Addendum)
  Your first shipment of 37 VGO was delivered to the front porch at the front door at 2:20 PM on 11/15/14,  Your the second shipment should arrive tomorrow.    Once filled, where should V-Go be stored? To help maintain a consistent daily routine, V-Go should be applied immediately after filling. However, V-Go may be filled with NovoLog for up to 5 days prior to use if refrigerated, or for up to 3 days prior to use if left at room temperature.  The V-Go can be worn any place that insulin can be injected or infused. Insulin is injected or infused into the subcutaneous tissue.   ? On the abdomen. The abdomen has ample flat surface area, and is an accessible and  comfortable location. Insulin absorption is fast, predictable, and less affected by  exercise when administered through the abdomen.   If the V-Go is worn on the abdomen, keep it horizontal above the belt line.  If the V-Go is worn on the backside of the arm, keep it in the up and down direction.Make sure you can see the grey indicator  in the viewing window directly by rotating your arm, or indirectly through the use of a mirror. Attach the V-Go in the up and down position as your arm hangs down.  How and Where you can put your VGO INSULIN PATCH  There are many different types of insulin as well as other injectable diabetes medicines that are meant to be injected into the fat layer under your skin. The type of insulin or injectable diabetes medicine you take may determine how many injections you give yourself and when to take the injections.   CHOOSING A SITE FOR Your VGO INSULIN PATCH Insulin absorption varies from site to site. As with any injectable medication it is best for the insulin to be injected within the same body region. However, do not inject the insulin in the same spot each time. Rotating the spots you give your injections will prevent inflammation or tissue breakdown. There are four main regions that can be used for  injections. The regions include the:  Abdomen (preferred region).  Front and upper outer sides of thighs.  Back of upper arm. Buttocks.   PLEASE MAKE AN APPOINTMENT WITH Jennifer [name] IN 4 WEEKS.

## 2014-12-16 NOTE — Telephone Encounter (Signed)
Patient says they did not get the first shipment and asks that we check the address that [facility] sent it to . CDE called [facility],  Address: [address], [place], [place] [address] Phone: [phone] to verify her address.  They have the correct address and usually call the patient with the first shipment.  CDE gave [facility] the mobile Number to call.

## 2014-12-16 NOTE — Progress Notes (Signed)
This is a follow up visit after patient was started on the VGO 29 to help improve her blood sugar control. Patient is here with an interpreter and her daughter.   Jennifer [name] says she did well with the VGo filling it herself with family looking at it to be sure it was filled. She took 2 clicks 3-4 times a day. However, when her sister got sick she stopped ewaring because of stress, the children on her sides and while sleeping as it pinched when they rubbed up against it and it also sounds like she did not get the VGOs through the mail She went back to her NPH insulin when not wearing the VGO, unsure about how much and how often she took it.  Patient is wants to retry the [place]. She demonstrated good technique of filling today in the office.   She says she got the 2 vials of Novolog from [facility] but only got a 5 day supply from the mail order company. CDE to call them. Sent out 30 VGO on 11/15/14 and 12/15/14.    Her son called CDE while patient was here and said that [facility] requested labs to do her eye suregery  incuding information about her blood sugars.   Diabetes Self-Management Education  Visit Type:  Follow-up  Appt. Start Time: 1340 Appt. End Time: 4656  12/16/2014  [name], identified by name and date of birth, is a 44 y.o. female with a diagnosis of Diabetes:  .   ASSESSMENT  Last menstrual period 11/26/2014. There is no weight on file to calculate BMI.       Diabetes Self-Management Education - 12/16/14 1500    Health Coping   How would you rate your overall health? Good   Psychosocial Assessment   Patient Belief/Attitude about Diabetes Motivated to manage diabetes   Self-care barriers Impaired vision   Self-management support Doctor's office;Family;CDE visits   Patient Concerns Glycemic Control   Special Needs Large print   Preferred Learning Style Auditory   Learning Readiness Ready   Complications   How often do you check your blood sugar? 1-2  times/day   Fasting Blood glucose range (mg/dL) >200   Have you had a dilated eye exam in the past 12 months? Yes   Are you checking your feet? Yes   Exercise   Exercise Type ADL's;Light (walking / raking leaves)   Patient Education   Previous Diabetes Education Yes - here many times in past   Medications Taught/reviewed insulin injection, site rotation, insulin storage and needle disposal.;Reviewed patients medication for diabetes, action, purpose, timing of dose and side effects.   Patient Self-Evaluation of Goals - Patient rates self as meeting previously set goals (% of time)   Medications < 25%   Outcomes   Program Status Not Completed   Subsequent Visit   Since your last visit, have you continued or began the use of a meal plan? Yes   Since your last visit, have you continued or began to exercise on a consistent basis? No   Since your last visit have you continued or begun to take your medications as prescribed? No   Since your last visit have you had your blood pressure checked? No   Since your last visit are you checking your feet? Yes   Since your last visit have you experienced any weight changes? Loss   Weight Loss (lbs) --  8   Since your last visit, are you checking your blood glucose  at least once a day? Yes      Learning Objective:  Patient will have a greater understanding of diabetes self-management. Patient education plan is to attend individual and/or group sessions per assessed needs and concerns.   Plan:   Patient Instructions   Your first shipment of 37 VGO was delivered to the front porch at the front door at 2:20 PM on 11/15/14,  Your the second shipment should arrive tomorrow.    Once filled, where should V-Go be stored? To help maintain a consistent daily routine, V-Go should be applied immediately after filling. However, V-Go may be filled with NovoLog for up to 5 days prior to use if refrigerated, or for up to 3 days prior to use if left at room  temperature.  The V-Go can be worn any place that insulin can be injected or infused. Insulin is injected or infused into the subcutaneous tissue.   ? On the abdomen. The abdomen has ample flat surface area, and is an accessible and  comfortable location. Insulin absorption is fast, predictable, and less affected by  exercise when administered through the abdomen.   If the V-Go is worn on the abdomen, keep it horizontal above the belt line.  If the V-Go is worn on the backside of the arm, keep it in the up and down direction.Make sure you can see the grey indicator  in the viewing window directly by rotating your arm, or indirectly through the use of a mirror. Attach the V-Go in the up and down position as your arm hangs down.  How and Where you can put your VGO INSULIN PATCH  There are many different types of insulin as well as other injectable diabetes medicines that are meant to be injected into the fat layer under your skin. The type of insulin or injectable diabetes medicine you take may determine how many injections you give yourself and when to take the injections.   CHOOSING A SITE FOR Your VGO INSULIN PATCH Insulin absorption varies from site to site. As with any injectable medication it is best for the insulin to be injected within the same body region. However, do not inject the insulin in the same spot each time. Rotating the spots you give your injections will prevent inflammation or tissue breakdown. There are four main regions that can be used for injections. The regions include the:  Abdomen (preferred region).  Front and upper outer sides of thighs.  Back of upper arm. Buttocks.   PLEASE MAKE AN APPOINTMENT WITH DONNA IN 4 WEEKS.     Expected Outcomes:  Demonstrated interest in learning. Expect positive outcomes Education material provided: AVS and picture of where she can wear the VGO If problems or questions, patient to contact team via:  Phone Future DSME  appointment: - 4 wks  Consider increasing to 3 clicks with each meal as CBGs were high during the time she was wearing it.

## 2014-12-16 NOTE — Patient Instructions (Addendum)
-  Start taking the vgo again and metformin 1000 mg twice a day if you can tolerate it for diabetes. Your A1c is 12.2 (was 11 at last visit) which is above goal of 7.  -Will recheck your vitamin D level and call you with the results -Keep taking iron pills daily  -Start taking pravastatin 40 mg daily for cholesterol  -Please come back in 4 weeks for follow-up, nice to see you again    General Instructions:   Please bring your medicines with you each time you come to clinic.  Medicines may include prescription medications, over-the-counter medications, herbal remedies, eye drops, vitamins, or other pills.   Progress Toward Treatment Goals:  Treatment Goal 09/23/2014  Hemoglobin A1C improved    Self Care Goals & Plans:  Self Care Goal 12/16/2014  Manage my medications take my medicines as prescribed; bring my medications to every visit; refill my medications on time  Monitor my health keep track of my blood glucose; bring my glucose meter and log to each visit  Eat healthy foods drink diet soda or water instead of juice or soda; eat more vegetables; eat foods that are low in salt; eat baked foods instead of fried foods; eat fruit for snacks and desserts  Be physically active -    Home Blood Glucose Monitoring 09/23/2014  Check my blood sugar 4 times a day  When to check my blood sugar before meals; at bedtime     Care Management & Community Referrals:  Referral 01/28/2013  Referrals made for care management support none needed

## 2014-12-16 NOTE — Telephone Encounter (Signed)
Faxed labs to Dr. Cinda Quest office.

## 2014-12-17 LAB — VITAMIN D 25 HYDROXY (VIT D DEFICIENCY, FRACTURES): Vit D, 25-Hydroxy: 17.7 ng/mL — ABNORMAL LOW (ref 30.0–100.0)

## 2014-12-17 NOTE — Assessment & Plan Note (Addendum)
Assessment: Pt with last A1c 11 on 09/14/14 partially compliant with oral hypoglycemic therapy and insulin therapy with recent symptomatic hypoglycemia who presents with blood glucose of 398 and worsened A1c of 12.2.   Plan:  -A1c 12.2 not at goal <7. Vgo 20 to be delivered to pt's home on 12/17/14 which she is to do 2 clicks three times daily with meals. Pt instructed to continue metformin 1000 mg BID. Pt to return in 4 weeks.  -BP 111/68 at goal <140/90 not on anti-hypertensive therapy -LDL 88 at goal <100, start pravastatin 40 mg daily (pt is in statin benefit group) -Last annual eye exam on 04/19/14 with retinopathy, lab records faxed today to Dr. Cinda Quest office for consideration of left eye enucleation  -Last annual foot exam on 04/13/14 -Last annual urine microalbumin test on 02/18/14 with no proteinuria  -Continue gabapentin 300 mg TID for chronic peripheral neuropathy (reports taking 300 mg BID) -BMI 31.41 not at goal <25, encourage weight loss

## 2014-12-17 NOTE — Assessment & Plan Note (Addendum)
Assessment: Pt with vitamin D deficiency with level of 15 on 10/28/14 with reported compliance with high dose vitamin D replacement therapy who presents with no recent fall or fracture   Plan:  -Obtain 25-OH vitamin D level ---> 17.7 consistent with deficiency however pt only took for 4 weeks -Per her pharmacy she completed 4 week therapy and picked up refill today on 12/17/14  -Continue ergocalciferol 50 K U weekly for additional 4 weeks to complete 8 week therapy  -Repeat 25-OH vitamin D level after completion of therapy

## 2014-12-17 NOTE — Assessment & Plan Note (Signed)
Assessment: Pt with last lipid panel on 02/19/15 with LDL 88 not on statin therapy with recommendations to start moderate to high intensity statin therapy due to statin benefit group (DM with LDL >70).   Plan:  -Prescribe pravastatin 40 mg daily -Last CMP on 10/28/14 with normal liver function  -Monitor for myalgias

## 2014-12-17 NOTE — Assessment & Plan Note (Signed)
Administer annual influenza vaccination at next visit in one month if pt agreeable

## 2014-12-20 NOTE — Progress Notes (Signed)
[facility] Medicine Clinic Attending  Case discussed with Dr. Rabbani soon after the resident saw the patient.  We reviewed the resident's history and exam and pertinent patient test results.  I agree with the assessment, diagnosis, and plan of care documented in the resident's note.  

## 2015-01-12 ENCOUNTER — Telehealth: Payer: Self-pay | Admitting: Internal Medicine

## 2015-01-12 NOTE — Telephone Encounter (Signed)
Called to pt to confirm appt for 01/13/15 at 1:15 lmtcb

## 2015-01-13 ENCOUNTER — Ambulatory Visit (INDEPENDENT_AMBULATORY_CARE_PROVIDER_SITE_OTHER): Payer: Medicaid Other | Admitting: Internal Medicine

## 2015-01-13 ENCOUNTER — Encounter: Payer: Self-pay | Admitting: Internal Medicine

## 2015-01-13 VITALS — BP 126/70 | HR 100 | Temp 98.0°F | Wt 196.6 lb

## 2015-01-13 DIAGNOSIS — E1165 Type 2 diabetes mellitus with hyperglycemia: Secondary | ICD-10-CM

## 2015-01-13 DIAGNOSIS — E113219 Type 2 diabetes mellitus with mild nonproliferative diabetic retinopathy with macular edema, unspecified eye: Secondary | ICD-10-CM

## 2015-01-13 DIAGNOSIS — E11321 Type 2 diabetes mellitus with mild nonproliferative diabetic retinopathy with macular edema: Secondary | ICD-10-CM

## 2015-01-13 DIAGNOSIS — Z794 Long term (current) use of insulin: Secondary | ICD-10-CM

## 2015-01-13 DIAGNOSIS — Z0000 Encounter for general adult medical examination without abnormal findings: Secondary | ICD-10-CM

## 2015-01-13 DIAGNOSIS — E1142 Type 2 diabetes mellitus with diabetic polyneuropathy: Secondary | ICD-10-CM

## 2015-01-13 DIAGNOSIS — Z6831 Body mass index (BMI) 31.0-31.9, adult: Secondary | ICD-10-CM

## 2015-01-13 DIAGNOSIS — Z23 Encounter for immunization: Secondary | ICD-10-CM

## 2015-01-13 MED ORDER — GABAPENTIN 300 MG PO CAPS: 300.0000 mg | ORAL_CAPSULE | Freq: Two times a day (BID) | ORAL | Status: DC

## 2015-01-13 NOTE — Assessment & Plan Note (Signed)
Pt received annual influenza vaccination today on 01/13/15

## 2015-01-13 NOTE — Patient Instructions (Signed)
-  Instead of 2 clicks before meals do 1 click before meals of the Vgo -Keep checking your blood sugars, don't take your old insulin anymore -Keep taking the metformin twice a day  -We will fax record of your blood sugars to your eye doctor, you average is 227 -Will give you a flu shot today  -Please come back in 4 weeks, very nice seeing you!  General Instructions:   Please bring your medicines with you each time you come to clinic.  Medicines may include prescription medications, over-the-counter medications, herbal remedies, eye drops, vitamins, or other pills.   Progress Toward Treatment Goals:  Treatment Goal 09/23/2014  Hemoglobin A1C improved    Self Care Goals & Plans:  Self Care Goal 12/16/2014  Manage my medications take my medicines as prescribed; bring my medications to every visit; refill my medications on time  Monitor my health keep track of my blood glucose; bring my glucose meter and log to each visit  Eat healthy foods drink diet soda or water instead of juice or soda; eat more vegetables; eat foods that are low in salt; eat baked foods instead of fried foods; eat fruit for snacks and desserts  Be physically active -    Home Blood Glucose Monitoring 09/23/2014  Check my blood sugar 4 times a day  When to check my blood sugar before meals; at bedtime     Care Management & Community Referrals:  Referral 01/28/2013  Referrals made for care management support none needed

## 2015-01-13 NOTE — Assessment & Plan Note (Addendum)
Assessment: Pt with last A1c 12.2 on 12/16/14 compliant with oral hypoglycemic therapy and insulin therapy with recent symptomatic hypoglycemia who presents with blood glucose of 244 at home today.   Plan:  -A1c 12.2 not at goal <7. Due to AM hypoglycemic episodes, decrease Vgo 20 from two clicks three times daily before meals to one click three times daily before meals. Continue metformin 1000 mg BID. Pt to return in 4 weeks.  -BP 126/70 at goal <140/90 not on anti-hypertensive therapy -LDL 88 at goal <100, continue pravastatin 40 mg daily -Last annual eye exam on 04/19/14 with retinopathy, pt's recent glucose meter readings faxed today to Dr. Cinda Quest office for consideration of left eye enucleation  -Last annual foot exam on 04/13/14 -Last annual urine microalbumin test on 02/18/14 with no proteinuria  -Continue gabapentin 300 mg BID for chronic peripheral neuropathy  -BMI 31.75 not at goal <25, encourage weight loss

## 2015-01-13 NOTE — Progress Notes (Signed)
[name], female   DOB: 28-May-1970, 44 y.o.   MRN: [id number]   Subjective:   [name] female   DOB: 1970-10-07 44 y.o.   MRN: [id number]  HPI: [name] is a 44 y.o. 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   History obtained form [facility] via tele-web.    Her last A1c was 12.2 on 12/16/14. She reports taking Vgo 20 (2 clicks before meals three times daily) and no longer taking her previous insulin. She is also compliant with taking metformin 1000 mg BID.She checks her blood sugar one to two times daily and has brought her meter which reveals average blood glucose of 227 with range of 50-338. She reports a few symptomatic hypoglycemia episodes with last one this morning with blood sugar of 50. She has chronic polydipsia but denies polyuria, polyphagia, or foot ulceration/injury. Her chronic peripheral neuropathy is well-controlled on gabapentin 300 mg BID. She tries to follow a healthy diet and walks regularly. She has gained 2 lb since last visit one month ago.   She is compliant with newly started pravastatin for hyperlipidemia. She denies myalgias or weakness.   She would like to have flu vaccination today.    Past Medical History  Diagnosis Date  . Routine/ritual circumcision   . Anemia, iron deficiency   . Glaucoma associated with ocular inflammation   . Uveitis   . Decreased visual acuity     Left eye  . Hair loss   . Hyperlipidemia   . Pap smear abnormality of cervix with LGSIL   . Type II diabetes mellitus   . Diabetes mellitus without complication   . Hypercholesteremia   . Blindness of left eye    Current Outpatient Prescriptions  Medication Sig Dispense Refill  . atropine 1 % ophthalmic solution Place 2 drops into both eyes 2 (two) times daily.    .  dorzolamide-timolol (COSOPT) 22.3-6.8 MG/ML ophthalmic solution Place 1 drop into the left eye 2 (two) times daily.    . ergocalciferol (VITAMIN D2) 50000 UNITS capsule Take 50,000 Units by mouth once a week.    . ferrous sulfate 325 (65 FE) MG tablet Take 1 tablet (325 mg total) by mouth daily. 30 tablet 5  . gabapentin (NEURONTIN) 300 MG capsule Take 1 capsule (300 mg total) by mouth 3 (three) times daily. 90 capsule 2  . glucose blood (ACCU-CHEK SMARTVIEW) test strip Check Blood Sugar 4 times daily before meals and at bedtime. Dx code: E11.321 150 each 6  . insulin aspart (NOVOLOG) 100 UNIT/ML injection Use  Novolog to fill one Vgo20 each day and take 2 clicks with each meal three time a day 20 mL 11  . Insulin Disposable Pump (V-GO 20) KIT Use 1 V-go for 24 hrs 1 kit 11  . Insulin Syringe-Needle U-100 (RELION INSULIN SYRINGE 1ML/31G) 31G X 5/16" 1 ML MISC 1 each by Does not apply route daily. 100 each 11  . Lancets (ACCU-CHEK SOFT TOUCH) lancets Check Blood Sugar 4 times daily before meals and at bedtime. Dx code: E11.321 100 each 11  . metFORMIN (GLUCOPHAGE) 1000 MG tablet Take 1 tablet (1,000 mg total) by mouth 2 (two) times daily with a meal. 60 tablet 11  . pravastatin (PRAVACHOL) 40 MG tablet Take 1 tablet (40 mg total) by mouth every evening. 30 tablet 5   No  current facility-administered medications for this visit.   No family history on file. Social History   Social History  . Marital Status: Married    Spouse Name: N/A  . Number of Children: N/A  . Years of Education: N/A   Social History Main Topics  . Smoking status: Never Smoker   . Smokeless tobacco: Not on file  . Alcohol Use: No  . Drug Use: No  . Sexual Activity: Not Currently   Other Topics Concern  . Not on file   Social History Narrative   ** Merged History Encounter **       Pt. plans to sue [facility] [employer] for testing supplies [phone] [name] July 28,2010 2:10PM      Pt is [place].     Review of Systems: Review of Systems  Constitutional: Positive for malaise/fatigue (chronic). Negative for weight loss.  Eyes: Positive for blurred vision (chronic) and pain (left eye).  Respiratory: Negative for cough, shortness of breath and wheezing.   Cardiovascular: Negative for chest pain and leg swelling.  Gastrointestinal: Negative for nausea, vomiting, abdominal pain, diarrhea and constipation.  Genitourinary: Negative for dysuria, urgency and frequency.  Musculoskeletal: Negative for myalgias.  Neurological: Positive for sensory change (chronic peripheral neuropathy). Negative for dizziness and headaches.  Endo/Heme/Allergies: Positive for polydipsia (chronic).     Objective:  Physical Exam: Filed Vitals:   01/13/15 1357  BP: 126/70  Pulse: 100  Temp: 98 F (36.7 C)  TempSrc: Oral  Weight: 196 lb 9.6 oz (89.177 kg)  SpO2: 100%    Physical Exam  Constitutional: She is oriented to person, place, and time. She appears well-developed and well-nourished. No distress.  HENT:  Head: Normocephalic and atraumatic.  Right Ear: External ear normal.  Left Ear: External ear normal.  Nose: Nose normal.  Mouth/Throat: Oropharynx is clear and moist. No oropharyngeal exudate.  Eyes: Conjunctivae and EOM are normal. Pupils are equal, round, and reactive to light. Right eye exhibits no discharge. Left eye exhibits no discharge. No scleral icterus.  Neck: Normal range of motion. Neck supple.  Cardiovascular: Normal rate, regular rhythm and normal heart sounds.   No murmur heard. Pulmonary/Chest: Effort normal and breath sounds normal. No respiratory distress. She has no wheezes. She has no rales.  Abdominal: Soft. Bowel sounds are normal. She exhibits no distension. There is no tenderness. There is no rebound and no guarding.  Musculoskeletal: Normal range of motion. She exhibits no edema or tenderness.  Neurological: She is alert and oriented to person, place, and time.  Skin:  Skin is warm and dry. No rash noted. She is not diaphoretic. No erythema. No pallor.  Psychiatric: She has a normal mood and affect. Her behavior is normal. Judgment and thought content normal.    Assessment & Plan:   Please see problem list for problem-based assessment and plan

## 2015-01-14 NOTE — Progress Notes (Signed)
[facility] Medicine Clinic Attending  Case discussed with Dr. Rabbani soon after the resident saw the patient.  We reviewed the resident's history and exam and pertinent patient test results.  I agree with the assessment, diagnosis, and plan of care documented in the resident's note.  

## 2015-01-17 NOTE — Addendum Note (Signed)
Addended byJuluis Mire on: 01/17/2015 02:39 PM   Modules accepted: Orders

## 2015-01-26 ENCOUNTER — Telehealth: Payer: Self-pay | Admitting: *Deleted

## 2015-01-26 NOTE — Telephone Encounter (Signed)
Left message on home ID recording about signing release of information regarding surgery and visa for sister per Nelva Bush. Hilda Blades Calia Napp RN 01/26/15 4:50PM

## 2015-01-30 ENCOUNTER — Telehealth: Payer: Self-pay | Admitting: Licensed Clinical Social Worker

## 2015-01-30 NOTE — Telephone Encounter (Signed)
Jennifer [name] is requesting a letter from her PCP indicating that she will have surgery and is requesting assistance from her sister following her surgery.  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  CSW placed call to son to obtain additional information.  CSW will await until surgery is schedule to include surgery date/time.

## 2015-02-02 NOTE — Telephone Encounter (Signed)
Per request, email sent requesting the following information: Mr. Eleasha Cataldo: The following information will be needed: Mother's Information Needed Date of Surgery: Mother's Country of Citizenship: Visa Status: Date of Entry to [place]: Ages of children still in the home: Aunt's Information Needed Aunt's Name: Duane Boston DOB: 02/03/1972 Country of Citizenship: Occupation: Address: Time period of Visit:

## 2015-02-02 NOTE — Telephone Encounter (Signed)
CSW placed call to pt's adult son, Jennifer [name] to discuss information needed for letter request and date of surgery.  Son states surgery has not been scheduled.  CSW requested son to notify this worker when date of surgery has been provided.  Son request CSW to email needed information and he will provide.  Email request sent to son: [email or web address]

## 2015-02-07 NOTE — Telephone Encounter (Signed)
CSW has not received information from pt/family regarding surgery.  CSW will close and remain available as needed.

## 2015-02-08 NOTE — Telephone Encounter (Signed)
CSW received the requested information and forwarded to PCP. Mother's Information Needed  Date of Surgery: November 9th  Mother's Country of Citizenship: [place]  Visa Status: Permanent Resident  Date of Entry to [place]: October 1997  Ages of children still in the home: Foy Mungia [address], Boshra Peloquin 16, Abdulrahman Gabor 13.    Aunt's Information Needed  Aunt's Name: Duane Boston  DOB: 02/03/1972  Country of Citizenship: [place]  Occupation: In Therapist, music   Address: [place] Time period of Visit: 3-6 month   My mother really needs assistance at home she is blind in her left eye and has difficulty doing basics things around the house. She wishes her sister would be granted a visa so she can assist her.  Attached are pictures of her sister passport with name and date of birth and passport number. Thx

## 2015-02-09 ENCOUNTER — Encounter: Payer: Self-pay | Admitting: Licensed Clinical Social Worker

## 2015-02-09 ENCOUNTER — Telehealth: Payer: Self-pay | Admitting: Licensed Clinical Social Worker

## 2015-02-09 NOTE — Telephone Encounter (Signed)
CSW received call from pt's son,  Jennifer [name], this morning.  Son inquiring if PCP was able to complete letter for Jennifer [name] to provide enough time to try to obtain visa for pt's sister.  CSW explained information was just received and forwarded to physician, office generally requests 2 weeks for physician to provide written documentation.  Son inquired if CSW would be able to provide letter.  CSW agreed, however, letter will only be signed by CSW and only include information on chart.  Son agree and will also await letter from PCP.  CSW placed call to Dr. Cinda Quest office and confirmed date and type of surgery.  CSW provided letter

## 2015-02-24 ENCOUNTER — Ambulatory Visit (INDEPENDENT_AMBULATORY_CARE_PROVIDER_SITE_OTHER): Payer: Medicaid Other | Admitting: Internal Medicine

## 2015-02-24 ENCOUNTER — Encounter: Payer: Self-pay | Admitting: Internal Medicine

## 2015-02-24 VITALS — BP 145/74 | HR 86 | Temp 98.1°F | Wt 199.2 lb

## 2015-02-24 DIAGNOSIS — Z7984 Long term (current) use of oral hypoglycemic drugs: Secondary | ICD-10-CM | POA: Diagnosis not present

## 2015-02-24 DIAGNOSIS — E113213 Type 2 diabetes mellitus with mild nonproliferative diabetic retinopathy with macular edema, bilateral: Secondary | ICD-10-CM | POA: Diagnosis not present

## 2015-02-24 DIAGNOSIS — E1165 Type 2 diabetes mellitus with hyperglycemia: Secondary | ICD-10-CM | POA: Diagnosis present

## 2015-02-24 DIAGNOSIS — E559 Vitamin D deficiency, unspecified: Secondary | ICD-10-CM

## 2015-02-24 DIAGNOSIS — Z794 Long term (current) use of insulin: Principal | ICD-10-CM

## 2015-02-24 DIAGNOSIS — E785 Hyperlipidemia, unspecified: Secondary | ICD-10-CM | POA: Diagnosis not present

## 2015-02-24 DIAGNOSIS — E1142 Type 2 diabetes mellitus with diabetic polyneuropathy: Secondary | ICD-10-CM

## 2015-02-24 DIAGNOSIS — E113219 Type 2 diabetes mellitus with mild nonproliferative diabetic retinopathy with macular edema, unspecified eye: Secondary | ICD-10-CM

## 2015-02-24 NOTE — Patient Instructions (Signed)
-  Great job on improving your blood sugars!  -Take off the vgo at night and put a new on in the morning when you wake up -Will check your bloodwork and call you with the results -Will write a letter for your sister to come help you after your eye surgery  -Very nice seeing you! Please come back in 2-4 weeks with your meter  General Instructions:   Please bring your medicines with you each time you come to clinic.  Medicines may include prescription medications, over-the-counter medications, herbal remedies, eye drops, vitamins, or other pills.   Progress Toward Treatment Goals:  Treatment Goal 09/23/2014  Hemoglobin A1C improved    Self Care Goals & Plans:  Self Care Goal 12/16/2014  Manage my medications take my medicines as prescribed; bring my medications to every visit; refill my medications on time  Monitor my health keep track of my blood glucose; bring my glucose meter and log to each visit  Eat healthy foods drink diet soda or water instead of juice or soda; eat more vegetables; eat foods that are low in salt; eat baked foods instead of fried foods; eat fruit for snacks and desserts  Be physically active -    Home Blood Glucose Monitoring 09/23/2014  Check my blood sugar 4 times a day  When to check my blood sugar before meals; at bedtime     Care Management & Community Referrals:  Referral 01/28/2013  Referrals made for care management support none needed

## 2015-02-25 ENCOUNTER — Encounter: Payer: Self-pay | Admitting: Internal Medicine

## 2015-02-25 LAB — LIPID PANEL
CHOLESTEROL TOTAL: 207 mg/dL — AB (ref 100–199)
Chol/HDL Ratio: 3 ratio units (ref 0.0–4.4)
HDL: 69 mg/dL (ref >39)
LDL Calculated: 121 mg/dL — ABNORMAL HIGH (ref 0–99)
TRIGLYCERIDES: 83 mg/dL (ref 0–149)
VLDL Cholesterol Cal: 17 mg/dL (ref 5–40)

## 2015-02-25 LAB — MICROALBUMIN / CREATININE URINE RATIO
Creatinine, Urine: 153 mg/dL
MICROALB/CREAT RATIO: 40.6 mg/g{creat} — AB (ref 0.0–30.0)
MICROALBUM., U, RANDOM: 62.1 ug/mL

## 2015-02-25 LAB — VITAMIN D 25 HYDROXY (VIT D DEFICIENCY, FRACTURES): Vit D, 25-Hydroxy: 17.7 ng/mL — ABNORMAL LOW (ref 30.0–100.0)

## 2015-02-25 MED ORDER — ATORVASTATIN CALCIUM 40 MG PO TABS: 40.0000 mg | ORAL_TABLET | Freq: Every day | ORAL | Status: DC

## 2015-02-25 MED ORDER — ERGOCALCIFEROL 1.25 MG (50000 UT) PO CAPS
50000.0000 [IU] | ORAL_CAPSULE | ORAL | Status: DC
Start: 2015-02-25 — End: 2015-05-13

## 2015-02-25 NOTE — Assessment & Plan Note (Addendum)
Assessment: Pt with last A1c 12.2 on 12/16/14 compliant with oral hypoglycemic therapy and insulin therapy with recent symptomatic hypoglycemia who presents with significantly improved average blood sugar of 146.    Plan:  -A1c 12.2 not at goal <7. Due to AM hypoglycemic episodes, pt instructed to take Vgo off at night and place new one in the morning and continue Vgo 20 one click three times daily before meals as well as metformin 1000 mg BID.   -BP 145/74 not at goal <140/90 not on anti-hypertensive therapy, will consider adding low dose lisinopril at next visit in setting of proteinuria  -Obtain annual lipid panel, LDL 121 not at goal <100, change pravastatin 40 mg daily to high intensity atorvastatin 40 mg daily -Last annual eye exam on 04/19/14 with retinopathy, pt's to have left eye surgery next week by Dr. Cordelia Pen   -Last annual foot exam on 04/13/14 -Obtain annual urine microalbumin---> 40.6 mg of proteinuria, will consider adding low dose lisinopril at next visit   -Continue gabapentin 300 mg BID for chronic peripheral neuropathy  -BMI 32.17 not at goal <25, encourage weight loss

## 2015-02-25 NOTE — Progress Notes (Signed)
[name], female   DOB: [birth year], 44 y.o.   MRN: [id number]   Subjective:   [name] female   DOB: 1970/12/18 44 y.o.   MRN: [id number]  HPI: [name] is a 44 y.o. 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   History obtained from [facility].   Her last A1c was 12.2 on 12/16/14. She has been compliant with using Vgo 20 (1 click before meals) and metformin 1000 mg BID.She checks her blood sugar one to two times daily and has brought her meter which reveals average blood glucose of 146 with range of 44-347. She has had four fasting episodes of symptomatic hypoglycemia. She reports feeling much better now that her blood sugars are better controlled. She denies polydipsia, polyuria, polyphagia, or foot ulceration/injury. Her chronic peripheral neuropathy is well-controlled on gabapentin 300 mg BID. She tries to follow a healthy diet and walks regularly. She has gained 3 lb since last visit 6 weeks ago. She is to have left eye  surgery next week.    She is compliant with taking pravastatin for hyperlipidemia. She denies myalgias or muscle weakness.   Her vitamin D level was low at 15 on 10/28/14 and reports completing 8 week course of high dose vitamin D therapy. She denies recent fracture, fall, or bone pain.     Past Medical History  Diagnosis Date  . Routine/ritual circumcision   . Anemia, iron deficiency   . Glaucoma associated with ocular inflammation   . Uveitis   . Decreased visual acuity     Left eye  . Hair loss   . Hyperlipidemia   . Pap smear abnormality of cervix with LGSIL   . Type II diabetes mellitus ([place])   . Diabetes mellitus without complication ([place])   . Hypercholesteremia   . Blindness of left eye    Current Outpatient Prescriptions  Medication Sig Dispense  Refill  . atropine 1 % ophthalmic solution Place 2 drops into both eyes 2 (two) times daily.    . dorzolamide-timolol (COSOPT) 22.3-6.8 MG/ML ophthalmic solution Place 1 drop into the left eye 2 (two) times daily.    . ergocalciferol (VITAMIN D2) 50000 UNITS capsule Take 50,000 Units by mouth once a week.    . ferrous sulfate 325 (65 FE) MG tablet Take 1 tablet (325 mg total) by mouth daily. 30 tablet 5  . gabapentin (NEURONTIN) 300 MG capsule Take 1 capsule (300 mg total) by mouth 2 (two) times daily. 90 capsule 2  . glucose blood (ACCU-CHEK SMARTVIEW) test strip Check Blood Sugar 4 times daily before meals and at bedtime. Dx code: E11.321 150 each 6  . insulin aspart (NOVOLOG) 100 UNIT/ML injection Use  Novolog to fill one Vgo20 each day and take 2 clicks with each meal three time a day 20 mL 11  . Insulin Disposable Pump (V-GO 20) KIT Use 1 V-go for 24 hrs 1 kit 11  . Insulin Syringe-Needle U-100 (RELION INSULIN SYRINGE 1ML/31G) 31G X 5/16" 1 ML MISC 1 each by Does not apply route daily. 100 each 11  . Lancets (ACCU-CHEK SOFT TOUCH) lancets Check Blood Sugar 4 times daily before meals and at bedtime. Dx code: E11.321 100 each 11  . metFORMIN (GLUCOPHAGE) 1000 MG tablet Take 1 tablet (1,000 mg total) by mouth 2 (two) times daily with a meal. 60 tablet 11  .  pravastatin (PRAVACHOL) 40 MG tablet Take 1 tablet (40 mg total) by mouth every evening. 30 tablet 5   No current facility-administered medications for this visit.   No family history on file. Social History   Social History  . Marital Status: Married    Spouse Name: N/A  . Number of Children: N/A  . Years of Education: N/A   Social History Main Topics  . Smoking status: Never Smoker   . Smokeless tobacco: None  . Alcohol Use: No  . Drug Use: No  . Sexual Activity: Not Currently   Other Topics Concern  . None   Social History Narrative   ** Merged History Encounter **       Pt. plans to sue [facility] for testing supplies  [phone] Jennifer [name] July 28,2010 2:10PM      Pt is [place].    Review of Systems: Review of Systems  Constitutional: Positive for malaise/fatigue (chronic). Negative for weight loss.  Eyes: Positive for blurred vision (chronic) and pain (left eye ).  Respiratory: Negative for cough, shortness of breath and wheezing.   Cardiovascular: Negative for chest pain and leg swelling.  Gastrointestinal: Positive for nausea. Negative for vomiting, abdominal pain, diarrhea and constipation.  Genitourinary: Negative for dysuria, urgency and frequency.  Musculoskeletal: Negative for myalgias.  Neurological: Positive for sensory change (chronic peripheral neuropathy) and headaches. Negative for dizziness.  Endo/Heme/Allergies: Negative for polydipsia.     Objective:  Physical Exam: Filed Vitals:   02/24/15 1556  BP: 145/74  Pulse: 86  Temp: 98.1 F (36.7 C)  TempSrc: Oral  Weight: 199 lb 3.2 oz (90.357 kg)  SpO2: 100%  Physical Exam  Constitutional: She is oriented to person, place, and time. She appears well-developed and well-nourished.  Veiled  HENT:  Head: Normocephalic and atraumatic.  Right Ear: External ear normal.  Left Ear: External ear normal.  Nose: Nose normal.  Mouth/Throat: Oropharynx is clear and moist. No oropharyngeal exudate.  Eyes: EOM are normal.  Neck: Normal range of motion. Neck supple.  Cardiovascular: Normal rate, regular rhythm and normal heart sounds.   No murmur heard. Pulmonary/Chest: Effort normal and breath sounds normal. No respiratory distress. She has no wheezes. She has no rales.  Abdominal: Soft. Bowel sounds are normal. She exhibits no distension. There is no tenderness. There is no rebound and no guarding.  Musculoskeletal: Normal range of motion. She exhibits no edema or tenderness.  Neurological: She is alert and oriented to person, place, and time.  Skin: Skin is warm and dry. No rash noted. No erythema. No pallor.  Psychiatric:  She has a normal mood and affect. Her behavior is normal. Judgment and thought content normal.     Assessment & Plan:   Please see problem list for problem-based assessment and plan

## 2015-02-25 NOTE — Assessment & Plan Note (Signed)
Assessment: Pt with last lipid panel on 02/18/14 with LDL 88 compliant with moderate intensity statin therapy with recommendations to continue moderate to high intensity statin therapy due to statin benefit group (DM with LDL >70).   Plan: -Obtain annual lipid panel ---> LDL 121 not at goal <70-100  -Change pravastatin 40 mg daily to high intensity atorvastatin 40 mg daily  -Last CMP on 10/28/14 with normal liver function -Continue to monitor for myalgias and myositis

## 2015-02-25 NOTE — Assessment & Plan Note (Signed)
Assessment: Pt with vitamin D deficiency with level of 17.1 on 12/16/14 status post 8-week course of high dose vitamin D replacement therapy who presents with no recent fall or fracture.   Plan:  -Obtain 25-OH vitamin D level ---> 17.7 consistent with deficiency  -Prescribe ergocalciferol 50 K U weekly for 8 weeks  -Repeat 25-OH vitamin D level after completion of therapy

## 2015-02-27 NOTE — Telephone Encounter (Signed)
Letter completed today, sorry it took so long! Hopefully they will allow her to come.   Dr. Naaman Plummer

## 2015-02-27 NOTE — Progress Notes (Signed)
Medicine attending: Medical history, presenting problems, physical findings, and medications, reviewed with Dr Marjan Rabbani on the day of the patient visit and I concur with her evaluation and management plan. 

## 2015-02-28 ENCOUNTER — Telehealth: Payer: Self-pay | Admitting: Licensed Clinical Social Worker

## 2015-02-28 NOTE — Telephone Encounter (Signed)
CSW placed call to Mr. Savarino, pt's English speaking son.  CSW informed pt's PCP completed letter request for in-home assistance from family member.  Son thankful and states he will come and pick the letter up from [facility].  Son states he received call from eye surgeon's office and states pt's A1C is high and surgery has been postponed.  Mohammed states he was told pt's A1C was 12.4 and eye office wanted Mrs. Whelpley to follow up with "diabetes doctor".  CSW informed son, pt's PCP currently manages her diabetic medication.  Son aware CSW will forward this information to triage RN and PCP.

## 2015-02-28 NOTE — Telephone Encounter (Signed)
Will let PCP know, pt was seen on 11/4 and has a follow-up apt scheduled in December.  I can move appointment up if needed.

## 2015-03-01 ENCOUNTER — Telehealth: Payer: Self-pay | Admitting: Dietician

## 2015-03-01 NOTE — Telephone Encounter (Signed)
CDE called patient: Per her son, her sugars are still ding well "sugars much better. 88 yesterday morning, Today 130 in the morning, 169 later today. He explained that The surgery was for pain and she has had No  pain  For over 7 months so they are okay with her not having the eye surgery. They would prefer to have her blood sugars improved for a longer period of time to promote healing if she is to have the surgery. He requested diet information from CDE which will be left for him to pick up at the front desk

## 2015-03-01 NOTE — Telephone Encounter (Signed)
[facility] about patient's blood sugars- left message for triage nurse there with information about patient's recent improved control of blood sugars. requested call back and offered to fax her meter download.

## 2015-03-03 ENCOUNTER — Telehealth: Payer: Self-pay | Admitting: Licensed Clinical Social Worker

## 2015-03-03 NOTE — Telephone Encounter (Signed)
Per PCP request, CSW placed call to pt's son, Lougenia Curlee.  CSW informed Mr. Pratt, Dr. Naaman Plummer has noted since pt's surgery has been postponed the letter request for pt's sister [place] Quinn Axe should not be sent in as the information in the letter is now incorrect.  PCP will provide a new letter when surgery is rescheduled and confirmed.  Son aware and indicated understanding.

## 2015-03-23 ENCOUNTER — Encounter (HOSPITAL_COMMUNITY): Payer: Self-pay

## 2015-03-23 ENCOUNTER — Emergency Department (HOSPITAL_COMMUNITY)
Admission: EM | Admit: 2015-03-23 | Discharge: 2015-03-23 | Disposition: A | Discharge status: Home / Self Care | Payer: Medicaid Other | Attending: Emergency Medicine | Admitting: Emergency Medicine

## 2015-03-23 DIAGNOSIS — R519 Headache, unspecified: Secondary | ICD-10-CM

## 2015-03-23 DIAGNOSIS — D509 Iron deficiency anemia, unspecified: Secondary | ICD-10-CM | POA: Insufficient documentation

## 2015-03-23 DIAGNOSIS — Z794 Long term (current) use of insulin: Secondary | ICD-10-CM | POA: Insufficient documentation

## 2015-03-23 DIAGNOSIS — R51 Headache: Secondary | ICD-10-CM | POA: Diagnosis present

## 2015-03-23 DIAGNOSIS — H5412 Blindness, left eye, low vision right eye: Secondary | ICD-10-CM | POA: Insufficient documentation

## 2015-03-23 DIAGNOSIS — E785 Hyperlipidemia, unspecified: Secondary | ICD-10-CM | POA: Insufficient documentation

## 2015-03-23 DIAGNOSIS — Z79899 Other long term (current) drug therapy: Secondary | ICD-10-CM | POA: Diagnosis not present

## 2015-03-23 DIAGNOSIS — E119 Type 2 diabetes mellitus without complications: Secondary | ICD-10-CM | POA: Insufficient documentation

## 2015-03-23 LAB — CBC
HCT: 35.1 % — ABNORMAL LOW (ref 36.0–46.0)
Hemoglobin: 10.9 g/dL — ABNORMAL LOW (ref 12.0–15.0)
MCH: 26 pg (ref 26.0–34.0)
MCHC: 31.1 g/dL (ref 30.0–36.0)
MCV: 83.6 fL (ref 78.0–100.0)
Platelets: 168 10*3/uL (ref 150–400)
RBC: 4.2 MIL/uL (ref 3.87–5.11)
RDW: 13.2 % (ref 11.5–15.5)
WBC: 7.2 10*3/uL (ref 4.0–10.5)

## 2015-03-23 LAB — BASIC METABOLIC PANEL
Anion gap: 8 (ref 5–15)
BUN: 12 mg/dL (ref 6–20)
CO2: 25 mmol/L (ref 22–32)
Calcium: 8.9 mg/dL (ref 8.9–10.3)
Chloride: 104 mmol/L (ref 101–111)
Creatinine, Ser: 0.46 mg/dL (ref 0.44–1.00)
GFR calc Af Amer: >60 mL/min (ref >60)
GFR calc non Af Amer: >60 mL/min (ref >60)
Glucose, Bld: 171 mg/dL — ABNORMAL HIGH (ref 65–99)
Potassium: 3.7 mmol/L (ref 3.5–5.1)
Sodium: 137 mmol/L (ref 135–145)

## 2015-03-23 LAB — I-STAT TROPONIN, ED: Troponin i, poc: 0 ng/mL (ref 0.00–0.08)

## 2015-03-23 MED ORDER — KETOROLAC TROMETHAMINE 15 MG/ML IJ SOLN
15.0000 mg | Freq: Once | INTRAMUSCULAR | Status: AC
  Administered 2015-03-23: 15 mg via INTRAVENOUS
  Filled 2015-03-23: qty 1

## 2015-03-23 MED ORDER — SODIUM CHLORIDE 0.9 % IV BOLUS (SEPSIS)
1000.0000 mL | Freq: Once | INTRAVENOUS | Status: AC
  Administered 2015-03-23: 1000 mL via INTRAVENOUS

## 2015-03-23 MED ORDER — PROCHLORPERAZINE EDISYLATE 5 MG/ML IJ SOLN
10.0000 mg | Freq: Four times a day (QID) | INTRAMUSCULAR | Status: DC | PRN
  Administered 2015-03-23: 10 mg via INTRAVENOUS
  Filled 2015-03-23: qty 2

## 2015-03-23 MED ORDER — DIPHENHYDRAMINE HCL 50 MG/ML IJ SOLN
25.0000 mg | Freq: Once | INTRAMUSCULAR | Status: AC
  Administered 2015-03-23: 25 mg via INTRAVENOUS
  Filled 2015-03-23: qty 1

## 2015-03-23 NOTE — ED Notes (Signed)
Pt also c/o chest pain when she lays back. Prefers to sit forward.

## 2015-03-23 NOTE — Discharge Instructions (Signed)

## 2015-03-23 NOTE — ED Notes (Signed)
Pt arrives c/o headache and vomiting since last night. Pt states hx diabetes.cbg 92 at home per patient. PT. uses son for Arabic interpretor as per her custom.

## 2015-03-28 ENCOUNTER — Ambulatory Visit: Payer: Medicaid Other | Admitting: Internal Medicine

## 2015-03-28 ENCOUNTER — Other Ambulatory Visit: Payer: Self-pay | Admitting: Internal Medicine

## 2015-03-31 NOTE — ED Provider Notes (Signed)
CSN: [id number]     Arrival date & time 03/23/15  4627 History   First MD Initiated Contact with Patient 03/23/15 0831     Chief Complaint  Patient presents with  . Headache     (Consider location/radiation/quality/duration/timing/severity/associated sxs/prior Treatment) HPI   44 year old female with headache. Onset last night. Gradual onset. Denies any trauma. Language barrier. Son translating. Headache is diffuse. Worse frontal region. No appreciable exacerbating relieving factors. Associated with photophobia. No fevers or chills. Mild nausea. No vomiting. No numbness, tingling or focal loss of strength. No contacts with similar symptoms.   Past Medical History  Diagnosis Date  . Routine/ritual circumcision   . Anemia, iron deficiency   . Glaucoma associated with ocular inflammation   . Uveitis   . Decreased visual acuity     Left eye  . Hair loss   . Hyperlipidemia   . Pap smear abnormality of cervix with LGSIL   . Type II diabetes mellitus ([place])   . Diabetes mellitus without complication ([place])   . Hypercholesteremia   . Blindness of left eye    Past Surgical History  Procedure Laterality Date  . Cesarean section     History reviewed. No pertinent family history. Social History  Substance Use Topics  . Smoking status: Never Smoker   . Smokeless tobacco: None  . Alcohol Use: No   OB History    Gravida Para Term Preterm AB TAB SAB Ectopic Multiple Living   _0 Review of Systems  All systems reviewed and negative, other than as noted in HPI.   Allergies  Review of patient's allergies indicates no known allergies.  Home Medications   Prior to Admission medications   Medication Sig Start Date End Date Taking? Authorizing Provider  atorvastatin (LIPITOR) 40 MG tablet Take 1 tablet (40 mg total) by mouth daily at 6 PM. 02/25/15  Yes Marjan Rabbani, MD  atropine 1 % ophthalmic solution Place 2 drops into both eyes 2 (two) times daily.   Yes  Historical Provider, MD  brimonidine (ALPHAGAN P) 0.1 % SOLN Place 1 drop into both eyes 2 (two) times daily.   Yes Historical Provider, MD  dorzolamide-timolol (COSOPT) 22.3-6.8 MG/ML ophthalmic solution Place 1 drop into the left eye 2 (two) times daily.   Yes Historical Provider, MD  Insulin NPH, Human,, Isophane, (HUMULIN N KWIKPEN) 100 UNIT/ML Kiwkpen Inject 40-60 Units into the skin 2 (two) times daily. 60 units in the morning 40 units in the evening   Yes Historical Provider, MD  prednisoLONE acetate (PRED FORTE) 1 % ophthalmic suspension Place 1 drop into the left eye 4 (four) times daily.   Yes Historical Provider, MD  ACCU-CHEK SMARTVIEW test strip CHECK BLOOD SUGAR 4 TIMES DAILY BEFORE MEAL AND BEDTIME. 03/29/15   Juluis Mire, MD  ergocalciferol (VITAMIN D2) 50000 UNITS capsule Take 1 capsule (50,000 Units total) by mouth once a week. Patient not taking: Reported on 03/23/2015 02/25/15   Juluis Mire, MD  ferrous sulfate 325 (65 FE) MG tablet Take 1 tablet (325 mg total) by mouth daily. Patient not taking: Reported on 03/23/2015 10/30/14 10/30/15  Juluis Mire, MD  gabapentin (NEURONTIN) 300 MG capsule Take 1 capsule (300 mg total) by mouth 2 (two) times daily. Patient not taking: Reported on 03/23/2015 01/13/15 01/13/16  Juluis Mire, MD  insulin aspart (NOVOLOG) 100 UNIT/ML injection Use  Novolog to fill one Vgo20 each day and take 2 clicks  with each meal three time a day Patient not taking: Reported on 03/23/2015 11/15/14 11/14/15  Marjan Rabbani, MD  Insulin Disposable Pump (V-GO 20) KIT Use 1 V-go for 24 hrs Patient not taking: Reported on 03/23/2015 11/15/14   Marjan Rabbani, MD  Insulin Syringe-Needle U-100 (RELION INSULIN SYRINGE 1ML/31G) 31G X 5/16" 1 ML MISC 1 each by Does not apply route daily. 06/25/13   Nora M Sadek, MD  Lancets (ACCU-CHEK SOFT TOUCH) lancets Check Blood Sugar 4 times daily before meals and at bedtime. Dx code: E11.321 09/23/14   Jennifer T Krall, MD  metFORMIN  (GLUCOPHAGE) 1000 MG tablet Take 1 tablet (1,000 mg total) by mouth 2 (two) times daily with a meal. Patient not taking: Reported on 03/23/2015 04/13/14 04/13/15  Nora M Sadek, MD   BP 173/57 mmHg  Pulse 77  Temp(Src) 98.4 F (36.9 C) (Oral)  Resp 16  SpO2 98%  LMP 03/20/2015 Physical Exam  Constitutional: She is oriented to person, place, and time. She appears well-developed and well-nourished. No distress.  HENT:  Head: Normocephalic and atraumatic.  Eyes: Conjunctivae and EOM are normal. Pupils are equal, round, and reactive to light. Right eye exhibits no discharge. Left eye exhibits no discharge.  Neck: Neck supple.  No nuchal rigidity  Cardiovascular: Normal rate, regular rhythm and normal heart sounds.  Exam reveals no gallop and no friction rub.   No murmur heard. Pulmonary/Chest: Effort normal and breath sounds normal. No respiratory distress.  Abdominal: Soft. She exhibits no distension. There is no tenderness.  Musculoskeletal: She exhibits no edema or tenderness.  Neurological: She is alert and oriented to person, place, and time. No cranial nerve deficit. She exhibits normal muscle tone. Coordination normal.  Normal-appearing gait  Skin: Skin is warm and dry.  Psychiatric: She has a normal mood and affect. Her behavior is normal. Thought content normal.  Nursing note and vitals reviewed.   ED Course  Procedures (including critical care time) Labs Review Labs Reviewed  BASIC METABOLIC PANEL - Abnormal; Notable for the following:    Glucose, Bld 171 (*)    All other components within normal limits  CBC - Abnormal; Notable for the following:    Hemoglobin 10.9 (*)    HCT 35.1 (*)    All other components within normal limits  I-STAT TROPOININ, ED    Imaging Review No results found. I have personally reviewed and evaluated these images and lab results as part of my medical decision-making.   EKG Interpretation   Date/Time:  Thursday March 23 2015 08:50:27  EST Ventricular Rate:  73 PR Interval:  158 QRS Duration: 105 QT Interval:  422 QTC Calculation: 465 R Axis:   7 Text Interpretation:  Sinus rhythm Abnormal R-wave progression, early  transition No old tracing to compare Confirmed by KOHUT  MD, STEPHEN  ([id number]) on 03/23/2015 9:22:31 AM      MDM   Final diagnoses:  Nonintractable headache, unspecified chronicity pattern, unspecified headache type    44yF with headache. Suspect primary HA. Consider emergent secondary causes such as bleed, infectious or mass but doubt. There is no history of trauma. Pt has a nonfocal neurological exam. Afebrile and neck supple. No use of blood thinning medication. Consider ocular etiology such as acute angle closure glaucoma but doubt. Pt denies acute change in visual acuity and eye exam unremarkable. Doubt temporal arteritis given age, no temporal tenderness and temporal artery pulsations palpable. Doubt CO poisoning. No contacts with similar symptoms. Doubt venous thrombosis. Doubt carotid   or vertebral arteries dissection. Symptoms improved with meds. Feel that can be safely discharged, but strict return precautions discussed. Outpt fu.     Virgel Manifold, MD 03/31/15 [phone]

## 2015-04-07 ENCOUNTER — Encounter: Payer: Medicaid Other | Admitting: Internal Medicine

## 2015-04-07 ENCOUNTER — Encounter: Payer: Self-pay | Admitting: Internal Medicine

## 2015-04-11 ENCOUNTER — Ambulatory Visit (INDEPENDENT_AMBULATORY_CARE_PROVIDER_SITE_OTHER): Payer: Medicaid Other | Admitting: Internal Medicine

## 2015-04-11 ENCOUNTER — Encounter: Payer: Self-pay | Admitting: Internal Medicine

## 2015-04-11 VITALS — BP 131/69 | HR 80 | Temp 97.9°F

## 2015-04-11 DIAGNOSIS — Z794 Long term (current) use of insulin: Secondary | ICD-10-CM

## 2015-04-11 DIAGNOSIS — E1142 Type 2 diabetes mellitus with diabetic polyneuropathy: Secondary | ICD-10-CM

## 2015-04-11 DIAGNOSIS — E113219 Type 2 diabetes mellitus with mild nonproliferative diabetic retinopathy with macular edema, unspecified eye: Secondary | ICD-10-CM | POA: Diagnosis not present

## 2015-04-11 DIAGNOSIS — E1140 Type 2 diabetes mellitus with diabetic neuropathy, unspecified: Secondary | ICD-10-CM | POA: Diagnosis not present

## 2015-04-11 DIAGNOSIS — E1165 Type 2 diabetes mellitus with hyperglycemia: Secondary | ICD-10-CM

## 2015-04-11 LAB — GLUCOSE, CAPILLARY: Glucose-Capillary: 385 mg/dL — ABNORMAL HIGH (ref 65–99)

## 2015-04-11 LAB — POCT GLYCOSYLATED HEMOGLOBIN (HGB A1C): HEMOGLOBIN A1C: 10.9

## 2015-04-11 MED ORDER — GABAPENTIN 300 MG PO CAPS: 300.0000 mg | ORAL_CAPSULE | Freq: Three times a day (TID) | ORAL | Status: DC

## 2015-04-11 NOTE — Assessment & Plan Note (Signed)
Pt complaining of increased numbness in her feet that starts at her toes up to her ankles. This is not new for her but she states it has gotten worse. She also has pins and needles sensation in her feet. She is currently on gabapentin 300mg  BID for this.   - increased gabapentin to 300mg  TID for her peripheral neuropathy, informed pt on importance of better glucose control. F/u in 2 weeks, can increased gabapentin as needed.

## 2015-04-11 NOTE — Progress Notes (Signed)
Case discussed with Dr. Truong at the time of the visit.  We reviewed the resident's history and exam and pertinent patient test results.  I agree with the assessment, diagnosis and plan of care documented in the resident's note. 

## 2015-04-11 NOTE — Patient Instructions (Signed)
Take 5 clicks with your VGo in the morning  Take 4 clicks with your VGo for lunch and dinner.   If your sugar is less than 70 drink juice and eat candy.   We will see you back in the clinic in 2 weeks to review your glucometer.   Start taking Gabapentin 300mg  THREE times a day for your numbness and pain in your feet.

## 2015-04-11 NOTE — Progress Notes (Signed)
Subjective:   [name] female   DOB: November 04, 1970 44 y.o.   MRN: [id number]  HPI: [name] is a 44 y.o. with past medical history as outlined below who presents to clinic for diabetes follow up. A mobile arabic translator was used during this encounter. Due to concerns for medication management and adherence her diabetes was only addressed at this visit to avoid confusion with medications. Her anemia and Vit D deficiency can be addressed at her 2 week follow up appt.   Please see problem list for status of the pt's chronic medical problems.  Past Medical History  Diagnosis Date  . Routine/ritual circumcision   . Anemia, iron deficiency   . Glaucoma associated with ocular inflammation   . Uveitis   . Decreased visual acuity     Left eye  . Hair loss   . Hyperlipidemia   . Pap smear abnormality of cervix with LGSIL   . Type II diabetes mellitus ([place])   . Diabetes mellitus without complication ([place])   . Hypercholesteremia   . Blindness of left eye    Current Outpatient Prescriptions  Medication Sig Dispense Refill  . ACCU-CHEK SMARTVIEW test strip CHECK BLOOD SUGAR 4 TIMES DAILY BEFORE MEAL AND BEDTIME. 150 each 11  . atorvastatin (LIPITOR) 40 MG tablet Take 1 tablet (40 mg total) by mouth daily at 6 PM. 30 tablet 5  . atropine 1 % ophthalmic solution Place 2 drops into both eyes 2 (two) times daily.    . brimonidine (ALPHAGAN P) 0.1 % SOLN Place 1 drop into both eyes 2 (two) times daily.    . dorzolamide-timolol (COSOPT) 22.3-6.8 MG/ML ophthalmic solution Place 1 drop into the left eye 2 (two) times daily.    . ergocalciferol (VITAMIN D2) 50000 UNITS capsule Take 1 capsule (50,000 Units total) by mouth once a week. (Patient not taking: Reported on 03/23/2015) 8 capsule 0  . ferrous sulfate 325 (65 FE) MG tablet Take 1 tablet (325 mg total) by mouth daily. (Patient not taking: Reported on 03/23/2015) 30 tablet 5  . gabapentin (NEURONTIN) 300 MG capsule Take 1  capsule (300 mg total) by mouth 2 (two) times daily. (Patient not taking: Reported on 03/23/2015) 90 capsule 2  . insulin aspart (NOVOLOG) 100 UNIT/ML injection Use  Novolog to fill one Vgo20 each day and take 2 clicks with each meal three time a day (Patient not taking: Reported on 03/23/2015) 20 mL 11  . Insulin Disposable Pump (V-GO 20) KIT Use 1 V-go for 24 hrs (Patient not taking: Reported on 03/23/2015) 1 kit 11  . Insulin NPH, Human,, Isophane, (HUMULIN N KWIKPEN) 100 UNIT/ML Kiwkpen Inject 40-60 Units into the skin 2 (two) times daily. 60 units in the morning 40 units in the evening    . Insulin Syringe-Needle U-100 (RELION INSULIN SYRINGE 1ML/31G) 31G X 5/16" 1 ML MISC 1 each by Does not apply route daily. 100 each 11  . Lancets (ACCU-CHEK SOFT TOUCH) lancets Check Blood Sugar 4 times daily before meals and at bedtime. Dx code: E11.321 100 each 11  . metFORMIN (GLUCOPHAGE) 1000 MG tablet Take 1 tablet (1,000 mg total) by mouth 2 (two) times daily with a meal. (Patient not taking: Reported on 03/23/2015) 60 tablet 11  . prednisoLONE acetate (PRED FORTE) 1 % ophthalmic suspension Place 1 drop into the left eye 4 (four) times daily.     No current facility-administered medications for this visit.   No family history on file. Social History  Social History  . Marital Status: Married    Spouse Name: N/A  . Number of Children: N/A  . Years of Education: N/A   Social History Main Topics  . Smoking status: Never Smoker   . Smokeless tobacco: Not on file  . Alcohol Use: No  . Drug Use: No  . Sexual Activity: Not Currently   Other Topics Concern  . Not on file   Social History Narrative   ** Merged History Encounter **       Pt. plans to sue [employer] for testing supplies [phone] [name] July 28,2010 2:10PM      Pt is [place] Arabic.    Review of Systems: Review of Systems  Constitutional: Positive for malaise/fatigue (intermittent). Negative for fever and chills.    Respiratory: Negative for shortness of breath.   Cardiovascular: Negative for chest pain.  Neurological: Positive for sensory change (increased numbness in her feet that are symmetrical in distribution, also has pins and needles sensation in both legs). Negative for weakness.    Objective:  Physical Exam: Filed Vitals:   04/11/15 1415  BP: 131/69  Pulse: 80  Temp: 97.9 F (36.6 C)  TempSrc: Oral  SpO2: 100%   Physical Exam  Constitutional: She appears well-developed and well-nourished. No distress.  HENT:  Head: Normocephalic.  Eyes:   Injected left conjunctiva  Cardiovascular: Normal rate, regular rhythm and normal heart sounds.   Pulmonary/Chest: Breath sounds normal. No respiratory distress. She has no wheezes.  Abdominal: Soft. Bowel sounds are normal. She exhibits no distension. There is no tenderness.  Musculoskeletal: She exhibits no edema.  Skin: Skin is warm and dry.   Assessment & Plan:   Please see problem based assessment and plan.

## 2015-04-11 NOTE — Assessment & Plan Note (Addendum)
Lab Results  Component Value Date   HGBA1C 10.9 04/11/2015   HGBA1C 12.2 12/16/2014   HGBA1C 11.0 09/14/2014     Assessment: Diabetes control:  uncontrolled Progress toward A1C goal:   progressing towards goal Comments: Difficult to illicit a clear medication history from patient. She is using the VGo patch. Initially pt states she was taking 4 units of insulin w/ meals, then 40 units, then further clarified that it was 4 clicks with meals. Pt is mainly concerned that her glucose fluctuates, ranging from 82 in the morning to 345 at lunch. Her glucometer report ranges from one low of 63 to 403. She eats regular meals and does not skip any meals.   Plan: Medications:  Despite one low reading of 63 in the morning will increase morning insulin to 5 clicks to account for her high afternoon CBGs. Continue 4 clicks with lunch and dinner.  Home glucose monitoring: Frequency:  QID Timing:  ACHS Instruction/counseling given: reminded to bring blood glucose meter & log to each visit and discussed foot care Educational resources provided: brochure, handout Self management tools provided: copy of home glucose meter download Other plans: Updated her med list. Foot exam done today. Elevated BPs in the past however today her BP was 131/69.

## 2015-04-26 ENCOUNTER — Ambulatory Visit: Payer: Medicaid Other | Admitting: Pulmonary Disease

## 2015-05-01 ENCOUNTER — Ambulatory Visit: Payer: Medicaid Other | Admitting: Pulmonary Disease

## 2015-05-13 ENCOUNTER — Encounter (HOSPITAL_COMMUNITY): Payer: Self-pay | Admitting: Emergency Medicine

## 2015-05-13 ENCOUNTER — Emergency Department (HOSPITAL_COMMUNITY): Payer: Medicaid Other

## 2015-05-13 ENCOUNTER — Emergency Department (HOSPITAL_COMMUNITY)
Admission: EM | Admit: 2015-05-13 | Discharge: 2015-05-13 | Disposition: A | Discharge status: Home / Self Care | Payer: Medicaid Other | Attending: Emergency Medicine | Admitting: Emergency Medicine

## 2015-05-13 DIAGNOSIS — Y92000 Kitchen of unspecified non-institutional (private) residence as  the place of occurrence of the external cause: Secondary | ICD-10-CM | POA: Diagnosis not present

## 2015-05-13 DIAGNOSIS — E119 Type 2 diabetes mellitus without complications: Secondary | ICD-10-CM | POA: Insufficient documentation

## 2015-05-13 DIAGNOSIS — S6991XA Unspecified injury of right wrist, hand and finger(s), initial encounter: Secondary | ICD-10-CM | POA: Insufficient documentation

## 2015-05-13 DIAGNOSIS — Z79899 Other long term (current) drug therapy: Secondary | ICD-10-CM | POA: Insufficient documentation

## 2015-05-13 DIAGNOSIS — S4991XA Unspecified injury of right shoulder and upper arm, initial encounter: Secondary | ICD-10-CM | POA: Diagnosis present

## 2015-05-13 DIAGNOSIS — S42001A Fracture of unspecified part of right clavicle, initial encounter for closed fracture: Secondary | ICD-10-CM | POA: Insufficient documentation

## 2015-05-13 DIAGNOSIS — Y999 Unspecified external cause status: Secondary | ICD-10-CM | POA: Insufficient documentation

## 2015-05-13 DIAGNOSIS — H409 Unspecified glaucoma: Secondary | ICD-10-CM | POA: Diagnosis not present

## 2015-05-13 DIAGNOSIS — E785 Hyperlipidemia, unspecified: Secondary | ICD-10-CM | POA: Diagnosis not present

## 2015-05-13 DIAGNOSIS — Z7952 Long term (current) use of systemic steroids: Secondary | ICD-10-CM | POA: Insufficient documentation

## 2015-05-13 DIAGNOSIS — Y9389 Activity, other specified: Secondary | ICD-10-CM | POA: Diagnosis not present

## 2015-05-13 DIAGNOSIS — E7800 Pure hypercholesterolemia, unspecified: Secondary | ICD-10-CM | POA: Diagnosis not present

## 2015-05-13 DIAGNOSIS — S0990XA Unspecified injury of head, initial encounter: Secondary | ICD-10-CM | POA: Diagnosis not present

## 2015-05-13 DIAGNOSIS — D649 Anemia, unspecified: Secondary | ICD-10-CM | POA: Diagnosis not present

## 2015-05-13 DIAGNOSIS — Z872 Personal history of diseases of the skin and subcutaneous tissue: Secondary | ICD-10-CM | POA: Diagnosis not present

## 2015-05-13 DIAGNOSIS — Z794 Long term (current) use of insulin: Secondary | ICD-10-CM | POA: Diagnosis not present

## 2015-05-13 DIAGNOSIS — W01198A Fall on same level from slipping, tripping and stumbling with subsequent striking against other object, initial encounter: Secondary | ICD-10-CM | POA: Insufficient documentation

## 2015-05-13 MED ORDER — KETOROLAC TROMETHAMINE 60 MG/2ML IM SOLN
60.0000 mg | Freq: Once | INTRAMUSCULAR | Status: AC
  Administered 2015-05-13: 60 mg via INTRAMUSCULAR
  Filled 2015-05-13: qty 2

## 2015-05-13 MED ORDER — OXYCODONE-ACETAMINOPHEN 5-325 MG PO TABS
1.0000 | ORAL_TABLET | Freq: Once | ORAL | Status: AC
  Administered 2015-05-13: 1 via ORAL
  Filled 2015-05-13: qty 1

## 2015-05-13 MED ORDER — IBUPROFEN 600 MG PO TABS: 600.0000 mg | ORAL_TABLET | Freq: Three times a day (TID) | ORAL | Status: DC | PRN

## 2015-05-13 MED ORDER — HYDROCODONE-ACETAMINOPHEN 5-325 MG PO TABS: 1.0000 | ORAL_TABLET | ORAL | Status: DC | PRN

## 2015-05-13 NOTE — Discharge Instructions (Signed)
Clavicle Fracture The clavicle, also called the collarbone, is the long bone that connects your shoulder to your rib cage. You can feel your collarbone at the top of your shoulders and rib cage. A clavicle fracture is a broken clavicle. It is a common injury that can happen at any age.  CAUSES Common causes of a clavicle fracture include:  A direct blow to your shoulder.  A car accident.  A fall, especially if you try to break your fall with an outstretched arm. RISK FACTORS You may be at increased risk if:  You are younger than 25 years or older than 67 years. Most clavicle fractures happen to people who are younger than 25 years.  You are a female.  You play contact sports. SIGNS AND SYMPTOMS A fractured clavicle is painful. It also makes it hard to move your arm. Other signs and symptoms may include:  A shoulder that drops downward and forward.  Pain when trying to lift your shoulder.  Bruising, swelling, and tenderness over your clavicle.  A grinding noise when you try to move your shoulder.  A bump over your clavicle. DIAGNOSIS Your health care provider can usually diagnose a clavicle fracture by asking about your injury and examining your shoulder and clavicle. He or she may take an X-ray to determine the position of your clavicle. TREATMENT Treatment depends on the position of your clavicle after the fracture:  If the broken ends of the bone are not out of place, your health care provider may put your arm in a sling or wrap a support bandage around your chest (figure-of-eight wrap).  If the broken ends of the bone are out of place, you may need surgery. Surgery may involve placing screws, pins, or plates to keep your clavicle stable while it heals. Healing may take about 3 months. When your health care provider thinks your fracture has healed enough, you may have to do physical therapy to regain normal movement and build up your arm strength. HOME CARE INSTRUCTIONS    Apply ice to the injured area:  Put ice in a plastic bag.  Place a towel between your skin and the bag.  Leave the ice on for 20 minutes, 2-3 times a day.  If you have a wrap or splint:  Wear it all the time, and remove it only to take a bath or shower.  When you bathe or shower, keep your shoulder in the same position as when the sling or wrap is on.  Do not lift your arm.  If you have a figure-of-eight wrap:  Another person must tighten it every day.  It should be tight enough to hold your shoulders back.  Allow enough room to place your index finger between your body and the strap.  Loosen the wrap immediately if you feel numbness or tingling in your hands.  Only take medicines as directed by your health care provider.  Avoid activities that make the injury or pain worse for 4-6 weeks after surgery.  Keep all follow-up appointments. SEEK MEDICAL CARE IF:  Your medicine is not helping to relieve pain and swelling. SEEK IMMEDIATE MEDICAL CARE IF:  Your arm is numb, cold, or pale, even when the splint is loose. MAKE SURE YOU:   Understand these instructions.  Will watch your condition.  Will get help right away if you are not doing well or get worse.   This information is not intended to replace advice given to you by your health care provider.  numb, cold, or pale, even when the splint is loose.  MAKE SURE YOU:   · Understand these instructions.  · Will watch your condition.  · Will get help right away if you are not doing well or get worse.     This information is not intended to replace advice given to you by your health care provider. Make sure you discuss any questions you have with your health care provider.     Document Released: 01/16/2005 Document Revised: 04/13/2013 Document Reviewed: 03/01/2013  Elsevier Interactive Patient Education ©2016 [employer]

## 2015-05-13 NOTE — ED Notes (Signed)
Pt slipped and fell in kitchen 1 week ago.  C/o pain to R shoulder, arm and wrist since fall.  States pain is now radiating into R side of neck and c/o headache.

## 2015-05-13 NOTE — ED Provider Notes (Signed)
CSN: [id number]     Arrival date & time 05/13/15  0010 History  By signing my name below, I, Altamease Oiler, attest that this documentation has been prepared under the direction and in the presence of Jola Schmidt, MD. Electronically Signed: Altamease Oiler, ED Scribe. 05/13/2015. 1:18 AM   Chief Complaint  Patient presents with  . Arm Pain  . Headache  . Fall    The history is provided by the patient. A language interpreter was used.   Jennifer [name] is a 45 y.o. female who presents to the Emergency Department complaining of new, 8/10 in severity, right wrist and shoulder pain with onset 1 week ago after a slip and fall in the kitchen where she landed on the right shoulder. Tylenol provided insufficient pain relief at home, last dose 1 hour ago.  Associated symptoms include headache and nausea. Pt denies head injury, LOC, and vomiting.    The pt does not speak [place]. Her son is at bedside translating.   Past Medical History  Diagnosis Date  . Routine/ritual circumcision   . Anemia, iron deficiency   . Glaucoma associated with ocular inflammation   . Uveitis   . Decreased visual acuity     Left eye  . Hair loss   . Hyperlipidemia   . Pap smear abnormality of cervix with LGSIL   . Type II diabetes mellitus ([place])   . Diabetes mellitus without complication ([place])   . Hypercholesteremia   . Blindness of left eye    Past Surgical History  Procedure Laterality Date  . Cesarean section     No family history on file. Social History  Substance Use Topics  . Smoking status: Never Smoker   . Smokeless tobacco: None  . Alcohol Use: No   OB History    Gravida Para Term Preterm AB TAB SAB Ectopic Multiple Living   '7 4 4  3  3   4     ' Review of Systems 10 Systems reviewed and all are negative for acute change except as noted in the HPI.   Allergies  Review of patient's allergies indicates no known allergies.  Home Medications   Prior to Admission medications    Medication Sig Start Date End Date Taking? Authorizing Provider  ACCU-CHEK SMARTVIEW test strip CHECK BLOOD SUGAR 4 TIMES DAILY BEFORE MEAL AND BEDTIME. 03/29/15   Juluis Mire, MD  atorvastatin (LIPITOR) 40 MG tablet Take 1 tablet (40 mg total) by mouth daily at 6 PM. 02/25/15   Juluis Mire, MD  atropine 1 % ophthalmic solution Place 2 drops into both eyes 2 (two) times daily.    Historical Provider, MD  brimonidine (ALPHAGAN P) 0.1 % SOLN Place 1 drop into both eyes 2 (two) times daily.    Historical Provider, MD  dorzolamide-timolol (COSOPT) 22.3-6.8 MG/ML ophthalmic solution Place 1 drop into the left eye 2 (two) times daily.    Historical Provider, MD  ergocalciferol (VITAMIN D2) 50000 UNITS capsule Take 1 capsule (50,000 Units total) by mouth once a week. Patient not taking: Reported on 03/23/2015 02/25/15   Juluis Mire, MD  ferrous sulfate 325 (65 FE) MG tablet Take 1 tablet (325 mg total) by mouth daily. Patient not taking: Reported on 03/23/2015 10/30/14 10/30/15  Juluis Mire, MD  gabapentin (NEURONTIN) 300 MG capsule Take 1 capsule (300 mg total) by mouth 3 (three) times daily. 04/11/15 04/10/16  Norman Herrlich, MD  Insulin Disposable Pump (V-GO 20) KIT Use 1 V-go for 24 hrs 11/15/14  Juluis Mire, MD  Lancets (ACCU-CHEK SOFT TOUCH) lancets Check Blood Sugar 4 times daily before meals and at bedtime. Dx code: N30.051 09/23/14   Milagros Loll, MD  prednisoLONE acetate (PRED FORTE) 1 % ophthalmic suspension Place 1 drop into the left eye 4 (four) times daily.    Historical Provider, MD   BP 144/72 mmHg  Pulse 82  Temp(Src) 97.9 F (36.6 C) (Oral)  Resp 16  SpO2 98%  LMP 04/14/2015 Physical Exam  Constitutional: She is oriented to person, place, and time. She appears well-developed and well-nourished. No distress.  HENT:  Head: Normocephalic and atraumatic.  Eyes: EOM are normal.  Neck: Normal range of motion.  Cardiovascular: Normal rate, regular rhythm and normal heart  sounds.   Pulmonary/Chest: Effort normal and breath sounds normal.  Abdominal: Soft. She exhibits no distension. There is no tenderness.  Musculoskeletal:  No obvious deformity at right clavicle Tenderness at right [facility] joint FROM of right elbow and right wrist Limited ROM at right shoulder past 90 degrees and with abduction  Neurological: She is alert and oriented to person, place, and time.  Skin: Skin is warm and dry.  Psychiatric: She has a normal mood and affect. Judgment normal.  Nursing note and vitals reviewed.   ED Course  Procedures (including critical care time) DIAGNOSTIC STUDIES: Oxygen Saturation is 98% on RA,  normal by my interpretation.    COORDINATION OF CARE: 1:03 AM Discussed treatment plan which includes right shoulder XR, Toradol, and Percocet with pt at bedside and pt agreed to plan.  Labs Review Labs Reviewed - No data to display  Imaging Review No results found. I have personally reviewed and evaluated these images as part of my medical decision-making.   EKG Interpretation None      MDM   Final diagnoses:  Right clavicle fracture, closed, initial encounter    Patient replace his shoulder immobilizer for likely distal right clavicle fracture.  Patient will be asked to follow-up with her primary care physician.  Home with ibuprofen and a short course of hydrocodone.  Discharge home in good condition.  Patient feeling better this time.  I personally performed the services described in this documentation, which was scribed in my presence. The recorded information has been reviewed and is accurate.       Jola Schmidt, MD 05/13/15 [phone]

## 2015-05-13 NOTE — ED Notes (Signed)
Pt A&Ox4, ambulatory at d/c with steady gait. Arm sling applied.

## 2015-05-18 ENCOUNTER — Encounter: Payer: Self-pay | Admitting: Internal Medicine

## 2015-05-18 ENCOUNTER — Ambulatory Visit (INDEPENDENT_AMBULATORY_CARE_PROVIDER_SITE_OTHER): Payer: Medicaid Other | Admitting: Internal Medicine

## 2015-05-18 VITALS — BP 160/82 | HR 82 | Temp 98.1°F

## 2015-05-18 DIAGNOSIS — S42001D Fracture of unspecified part of right clavicle, subsequent encounter for fracture with routine healing: Secondary | ICD-10-CM

## 2015-05-18 DIAGNOSIS — S42009A Fracture of unspecified part of unspecified clavicle, initial encounter for closed fracture: Secondary | ICD-10-CM

## 2015-05-18 DIAGNOSIS — Z8781 Personal history of (healed) traumatic fracture: Secondary | ICD-10-CM

## 2015-05-18 DIAGNOSIS — M25531 Pain in right wrist: Secondary | ICD-10-CM

## 2015-05-18 DIAGNOSIS — W1830XD Fall on same level, unspecified, subsequent encounter: Secondary | ICD-10-CM

## 2015-05-18 HISTORY — DX: Personal history of (healed) traumatic fracture: Z87.81

## 2015-05-18 NOTE — Patient Instructions (Signed)
1. Continue pain medications as needed to keep moving your shoulder and wrist. 2. Use your shoulder and wrist more and more. 3. Stretch your shoulder.   Shoulder Range of Motion Exercises Shoulder range of motion (ROM) exercises are designed to keep the shoulder moving freely. They are often recommended for people who have shoulder pain. MOVEMENT EXERCISE When you are able, do this exercise 5-6 days per week, or as told by your health care provider. Work toward doing 2 sets of 10 swings. Pendulum Exercise How To Do This Exercise Lying Down 1. Lie face-down on a bed with your abdomen close to the side of the bed. 2. Let your arm hang over the side of the bed. 3. Relax your shoulder, arm, and hand. 4. Slowly and gently swing your arm forward and back. Do not use your neck muscles to swing your arm. They should be relaxed. If you are struggling to swing your arm, have someone gently swing it for you. When you do this exercise for the first time, swing your arm at a 15 degree angle for 15 seconds, or swing your arm 10 times. As pain lessens over time, increase the angle of the swing to 30-45 degrees. 5. Repeat steps 1-4 with the other arm. How To Do This Exercise While Standing 1. Stand next to a sturdy chair or table and hold on to it with your hand.  Bend forward at the waist.  Bend your knees slightly.  Relax your other arm and let it hang limp.  Relax the shoulder blade of the arm that is hanging and let it drop.  While keeping your shoulder relaxed, use body motion to swing your arm in small circles. The first time you do this exercise, swing your arm for about 30 seconds or 10 times. When you do it next time, swing your arm for a little longer.  Stand up tall and relax.  Repeat steps 1-7, this time changing the direction of the circles. 2. Repeat steps 1-8 with the other arm. STRETCHING EXERCISES Do these exercises 3-4 times per day on 5-6 days per week or as told by your health  care provider. Work toward holding the stretch for 20 seconds. Stretching Exercise 1 1. Lift your arm straight out in front of you. 2. Bend your arm 90 degrees at the elbow (right angle) so your forearm goes across your body and looks like the letter "L." 3. Use your other arm to gently pull the elbow forward and across your body. 4. Repeat steps 1-3 with the other arm. Stretching Exercise 2 You will need a towel or rope for this exercise. 1. Bend one arm behind your back with the palm facing outward. 2. Hold a towel with your other hand. 3. Reach the arm that holds the towel above your head, and bend that arm at the elbow. Your wrist should be behind your neck. 4. Use your free hand to grab the free end of the towel. 5. With the higher hand, gently pull the towel up behind you. 6. With the lower hand, pull the towel down behind you. 7. Repeat steps 1-6 with the other arm. STRENGTHENING EXERCISES Do each of these exercises at four different times of day (sessions) every day or as told by your health care provider. To begin with, repeat each exercise 5 times (repetitions). Work toward doing 3 sets of 12 repetitions or as told by your health care provider. Strengthening Exercise 1 You will need a light weight for  this activity. As you grow stronger, you may use a heavier weight. 1. Standing with a weight in your hand, lift your arm straight out to the side until it is at the same height as your shoulder. 2. Bend your arm at 90 degrees so that your fingers are pointing to the ceiling. 3. Slowly raise your hand until your arm is straight up in the air. 4. Repeat steps 1-3 with the other arm. Strengthening Exercise 2 You will need a light weight for this activity. As you grow stronger, you may use a heavier weight. 1. Standing with a weight in your hand, gradually move your straight arm in an arc, starting at your side, then out in front of you, then straight up over your head. 2. Gradually move  your other arm in an arc, starting at your side, then out in front of you, then straight up over your head. 3. Repeat steps 1-2 with the other arm. Strengthening Exercise 3 You will need an elastic band for this activity. As you grow stronger, gradually increase the size of the bands or increase the number of bands that you use at one time. 1. While standing, hold an elastic band in one hand and raise that arm up in the air. 2. With your other hand, pull down the band until that hand is by your side. 3. Repeat steps 1-2 with the other arm.   This information is not intended to replace advice given to you by your health care provider. Make sure you discuss any questions you have with your health care provider.   Document Released: 01/05/2003 Document Revised: 08/23/2014 Document Reviewed: 04/04/2014 Elsevier Interactive Patient Education [employer].

## 2015-05-18 NOTE — Progress Notes (Signed)
[name], female   DOB: Sep 25, 1970, 45 y.o.   MRN: [id number]   Subjective:   [name] female   DOB: 30-Jun-1970 45 y.o.   MRN: [id number]  HPI: Jennifer [name] is a 45 y.o. female with PMH as below, here for f/u ED for shoulder injury.  Please see Problem-Based charting for the status of the patient's chronic medical issues.  Patient was seen in the ED 2 weeks ago after sustaining a fall in her kitchen. She fell onto her right flexed wrist and hit her right shoulder on the ground. After a couple hours, her shoulder began hurting and she went to the ED. Xrays showed a probably nondisplaced distal clavicle fracture for which she was given a sling and pain medications.   Today she has continued pain in her shoulder and wrist, though they are both improving.  The pain does not radiate.  She denies swelling or erythema to the area.  She is wearing the sling 6-7 hours daily and continuing to use her hand and shoulder.  Her pain is worst when raising or lowering her shoulder.   Past Medical History  Diagnosis Date  . Routine/ritual circumcision   . Anemia, iron deficiency   . Glaucoma associated with ocular inflammation   . Uveitis   . Decreased visual acuity     Left eye  . Hair loss   . Hyperlipidemia   . Pap smear abnormality of cervix with LGSIL   . Type II diabetes mellitus ([place])   . Diabetes mellitus without complication ([place])   . Hypercholesteremia   . Blindness of left eye    Current Outpatient Prescriptions  Medication Sig Dispense Refill  . ACCU-CHEK SMARTVIEW test strip CHECK BLOOD SUGAR 4 TIMES DAILY BEFORE MEAL AND BEDTIME. 150 each 11  . atropine 1 % ophthalmic solution Place 1 drop into both eyes daily.     . brimonidine (ALPHAGAN P) 0.1 % SOLN Place 1 drop into the left eye 2 (two) times daily.     . dorzolamide-timolol (COSOPT) 22.3-6.8 MG/ML ophthalmic solution Place 1 drop into the left eye 2 (two) times daily.    Marland Kitchen gabapentin  (NEURONTIN) 300 MG capsule Take 1 capsule (300 mg total) by mouth 3 (three) times daily. 90 capsule 4  . HYDROcodone-acetaminophen (NORCO/VICODIN) 5-325 MG tablet Take 1 tablet by mouth every 4 (four) hours as needed for moderate pain. 12 tablet 0  . ibuprofen (ADVIL,MOTRIN) 600 MG tablet Take 1 tablet (600 mg total) by mouth every 8 (eight) hours as needed. 15 tablet 0  . Insulin NPH, Human,, Isophane, (HUMULIN N KWIKPEN) 100 UNIT/ML Kiwkpen Inject 40 Units into the skin 2 (two) times daily.    . Lancets (ACCU-CHEK SOFT TOUCH) lancets Check Blood Sugar 4 times daily before meals and at bedtime. Dx code: E11.321 100 each 11  . metFORMIN (GLUCOPHAGE) 500 MG tablet Take 1,000 mg by mouth 2 (two) times daily with a meal.    . prednisoLONE acetate (PRED FORTE) 1 % ophthalmic suspension Place 1 drop into the right eye 2 (two) times daily.      No current facility-administered medications for this visit.   No family history on file. Social History   Social History  . Marital Status: Married    Spouse Name: N/A  . Number of Children: N/A  . Years of Education: N/A   Social History Main Topics  . Smoking status: Never Smoker   . Smokeless tobacco: Not on file  .  Alcohol Use: No  . Drug Use: No  . Sexual Activity: Not Currently   Other Topics Concern  . Not on file   Social History Narrative   ** Merged History Encounter **       Pt. plans to sue [employer] for testing supplies [phone] [name] July 28,2010 2:10PM      Pt is [place].    Review of Systems: Pertinent items are noted in HPI. Objective:  Physical Exam: There were no vitals filed for this visit. Physical Exam  Constitutional: She is oriented to person, place, and time and well-developed, well-nourished, and in no distress. No distress.  HENT:  Head: Normocephalic and atraumatic.  Eyes: EOM are normal. No scleral icterus.  Musculoskeletal:  Pinpoint tenderness to palpation of distal clavicle/AC  joint.  No swelling or erythema appreciated.  ROM normal compared to left side, though pain elicited with abduction and flexion to approximately 90 degrees.   Right wrist TTP along joint line.  No pain in snuff box.  Patient reports crepitation with flexion/extension but none appreciated.  Distal radius appear asymmetric compared to left but patient denies pain on palpation to radius.  Neurological: She is alert and oriented to person, place, and time.  Skin: Skin is warm and dry. She is not diaphoretic.     Assessment & Plan:   Patient and case were discussed with Dr. Evette Doffing.  Please refer to Problem Based charting for further documentation.

## 2015-05-18 NOTE — Assessment & Plan Note (Signed)
Patient sustained nondisplaced distal clavicle fracture. She continues to have pain with movement but no functional limitations.  Will continue conservative management with pain medications and shoulder exercises to prevent adhesive capsulitis.  Her wrist injury is likely strain from hyperflexion. No findings concerning for occult fracture. - Continue Norco PRN and Ibuprofen/Aleve PRN - RTC 3 months.

## 2015-05-19 NOTE — Progress Notes (Signed)
Internal Medicine Clinic Attending  Case discussed with Dr. Taylor at the time of the visit.  We reviewed the resident's history and exam and pertinent patient test results.  I agree with the assessment, diagnosis, and plan of care documented in the resident's note. 

## 2015-06-16 ENCOUNTER — Other Ambulatory Visit: Payer: Self-pay | Admitting: Internal Medicine

## 2015-06-20 ENCOUNTER — Emergency Department (HOSPITAL_COMMUNITY)
Admission: EM | Admit: 2015-06-20 | Discharge: 2015-06-21 | Disposition: A | Discharge status: Home / Self Care | Payer: Medicaid Other | Attending: Emergency Medicine | Admitting: Emergency Medicine

## 2015-06-20 ENCOUNTER — Encounter (HOSPITAL_COMMUNITY): Payer: Self-pay

## 2015-06-20 DIAGNOSIS — Y998 Other external cause status: Secondary | ICD-10-CM | POA: Diagnosis not present

## 2015-06-20 DIAGNOSIS — Y9389 Activity, other specified: Secondary | ICD-10-CM | POA: Diagnosis not present

## 2015-06-20 DIAGNOSIS — M25531 Pain in right wrist: Secondary | ICD-10-CM

## 2015-06-20 DIAGNOSIS — H5442 Blindness, left eye, normal vision right eye: Secondary | ICD-10-CM | POA: Insufficient documentation

## 2015-06-20 DIAGNOSIS — Z79899 Other long term (current) drug therapy: Secondary | ICD-10-CM | POA: Diagnosis not present

## 2015-06-20 DIAGNOSIS — E119 Type 2 diabetes mellitus without complications: Secondary | ICD-10-CM | POA: Diagnosis not present

## 2015-06-20 DIAGNOSIS — W1839XA Other fall on same level, initial encounter: Secondary | ICD-10-CM | POA: Insufficient documentation

## 2015-06-20 DIAGNOSIS — Z7952 Long term (current) use of systemic steroids: Secondary | ICD-10-CM | POA: Insufficient documentation

## 2015-06-20 DIAGNOSIS — Z862 Personal history of diseases of the blood and blood-forming organs and certain disorders involving the immune mechanism: Secondary | ICD-10-CM | POA: Diagnosis not present

## 2015-06-20 DIAGNOSIS — Z7984 Long term (current) use of oral hypoglycemic drugs: Secondary | ICD-10-CM | POA: Insufficient documentation

## 2015-06-20 DIAGNOSIS — Y9289 Other specified places as the place of occurrence of the external cause: Secondary | ICD-10-CM | POA: Insufficient documentation

## 2015-06-20 DIAGNOSIS — S6991XA Unspecified injury of right wrist, hand and finger(s), initial encounter: Secondary | ICD-10-CM | POA: Insufficient documentation

## 2015-06-20 DIAGNOSIS — H409 Unspecified glaucoma: Secondary | ICD-10-CM | POA: Insufficient documentation

## 2015-06-20 DIAGNOSIS — Z872 Personal history of diseases of the skin and subcutaneous tissue: Secondary | ICD-10-CM | POA: Insufficient documentation

## 2015-06-20 NOTE — ED Notes (Signed)
Pt here for right wrist pain, onset 1.5 months ago after a fall. Pt is able to move fingers and bend her wrist without problems.

## 2015-06-21 ENCOUNTER — Emergency Department (HOSPITAL_COMMUNITY): Payer: Medicaid Other

## 2015-06-21 ENCOUNTER — Encounter: Payer: Self-pay | Admitting: *Deleted

## 2015-06-21 MED ORDER — NAPROXEN 375 MG PO TABS: 375.0000 mg | ORAL_TABLET | Freq: Two times a day (BID) | ORAL | Status: DC

## 2015-06-21 MED ORDER — OXYCODONE-ACETAMINOPHEN 5-325 MG PO TABS: 1.0000 | ORAL_TABLET | ORAL | Status: DC | PRN

## 2015-06-21 NOTE — ED Notes (Signed)
Pt transported to XRAY °

## 2015-06-21 NOTE — ED Provider Notes (Signed)
CSN: [id number]     Arrival date & time 06/20/15  2346 History   None    Chief Complaint  Patient presents with  . Wrist Pain     (Consider location/radiation/quality/duration/timing/severity/associated sxs/prior Treatment) HPI   Jennifer [name] is a(n) 45 y.o. female who presents  To the ED with Right wrist pain. She fell and hurt her wrist and had a distal clavicle several weeks ago. She states that her Darrold Junker is aching and feels "like it needs to pop."  She denies numbness, tingling, weakness. She describes the pain as aching discomfort. No previous history of wrist pain prior to the original injury. She has been seen multiple times by her PCP.  Past Medical History  Diagnosis Date  . Routine/ritual circumcision   . Anemia, iron deficiency   . Glaucoma associated with ocular inflammation   . Uveitis   . Decreased visual acuity     Left eye  . Hair loss   . Hyperlipidemia   . Pap smear abnormality of cervix with LGSIL   . Type II diabetes mellitus ([place])   . Diabetes mellitus without complication ([place])   . Hypercholesteremia   . Blindness of left eye    Past Surgical History  Procedure Laterality Date  . Cesarean section     No family history on file. Social History  Substance Use Topics  . Smoking status: Never Smoker   . Smokeless tobacco: None  . Alcohol Use: No   OB History    Gravida Para Term Preterm AB TAB SAB Ectopic Multiple Living   7 4 4  3  3   4      Review of Systems  Constitutional: Negative for fever and chills.  Musculoskeletal: Negative for joint swelling.  Neurological: Negative for weakness.      Allergies  Review of patient's allergies indicates no known allergies.  Home Medications   Prior to Admission medications   Medication Sig Start Date End Date Taking? Authorizing Provider  ACCU-CHEK SMARTVIEW test strip CHECK BLOOD SUGAR 4 TIMES DAILY BEFORE MEAL AND BEDTIME. 03/29/15   Juluis Mire, MD  atropine 1 % ophthalmic solution  Place 1 drop into both eyes daily.     Historical Provider, MD  brimonidine (ALPHAGAN P) 0.1 % SOLN Place 1 drop into the left eye 2 (two) times daily.     Historical Provider, MD  dorzolamide-timolol (COSOPT) 22.3-6.8 MG/ML ophthalmic solution Place 1 drop into the left eye 2 (two) times daily.    Historical Provider, MD  gabapentin (NEURONTIN) 300 MG capsule Take 1 capsule (300 mg total) by mouth 3 (three) times daily. 04/11/15 04/10/16  Norman Herrlich, MD  HUMULIN N KWIKPEN 100 UNIT/ML Kiwkpen INJECT  60 UNITS SUBCUTANEOUSLY EVERY MORNING AND 40 UNITS EVERY EVENING 06/16/15   Juluis Mire, MD  HYDROcodone-acetaminophen (NORCO/VICODIN) 5-325 MG tablet Take 1 tablet by mouth every 4 (four) hours as needed for moderate pain. 05/13/15   Jola Schmidt, MD  ibuprofen (ADVIL,MOTRIN) 600 MG tablet Take 1 tablet (600 mg total) by mouth every 8 (eight) hours as needed. 05/13/15   Jola Schmidt, MD  Lancets (ACCU-CHEK SOFT [facility]) lancets Check Blood Sugar 4 times daily before meals and at bedtime. Dx code: [id number] 09/23/14   Milagros Loll, MD  metFORMIN (GLUCOPHAGE) 500 MG tablet Take 1,000 mg by mouth 2 (two) times daily with a meal.    Historical Provider, MD  prednisoLONE acetate (PRED FORTE) 1 % ophthalmic suspension Place 1 drop into the right eye  2 (two) times daily.     Historical Provider, MD   BP 154/75 mmHg  Pulse 91  Temp(Src) 98.2 F (36.8 C) (Oral)  Resp 16  SpO2 96% Physical Exam  Constitutional: She is oriented to person, place, and time. She appears well-developed and well-nourished. No distress.  HENT:  Head: Normocephalic and atraumatic.  Eyes: Conjunctivae are normal. No scleral icterus.  Neck: Normal range of motion.  Cardiovascular: Normal rate, regular rhythm and normal heart sounds.  Exam reveals no gallop and no friction rub.   No murmur heard. Pulmonary/Chest: Effort normal and breath sounds normal. No respiratory distress.  Abdominal: Soft. Bowel sounds are normal. She  exhibits no distension and no mass. There is no tenderness. There is no guarding.  Musculoskeletal:  Right wrist with some swelling on the dorsum of the wrist. Only mild tenderness. Full range of motion and strength. Neurovascularly intact  Neurological: She is alert and oriented to person, place, and time.  Skin: Skin is warm and dry. She is not diaphoretic.    ED Course  Procedures (including critical care time) Labs Review Labs Reviewed - No data to display  Imaging Review Dg Wrist Complete Right  06/21/2015  CLINICAL DATA:  45 year old female with fall and right wrist pain. EXAM: RIGHT WRIST - COMPLETE 3+ VIEW COMPARISON:  None. FINDINGS: There is no acute fracture or dislocation. There is mild soft tissue swelling of the wrist. No radiopaque foreign object. IMPRESSION: No acute fracture or dislocation. Electronically Signed   By: Anner Crete M.D.   On: 06/21/2015 00:21   I have personally reviewed and evaluated these images and lab results as part of my medical decision-making.   EKG Interpretation None      MDM   Final diagnoses:  None    Patient X-Ray negative for obvious fracture or dislocation. Pain managed in ED. Pt advised to follow up with orthopedics if symptoms persist for possibility of missed fracture diagnosis. Patient given brace while in ED, conservative therapy recommended and discussed. Patient will be dc home & is agreeable with above plan.     Margarita Mail, PA-C 06/21/15 0127  Veryl Speak, MD 06/21/15 [phone]

## 2015-06-21 NOTE — ED Notes (Signed)
Patient able to ambulate independently  

## 2015-06-21 NOTE — Discharge Instructions (Signed)
Wrist Pain There are many things that can cause wrist pain. Some common causes include:  An injury to the wrist area, such as a sprain, strain, or fracture.  Overuse of the joint.  A condition that causes increased pressure on a nerve in the wrist (carpal tunnel syndrome).  Wear and tear of the joints that occurs with aging (osteoarthritis).  A variety of other types of arthritis. Sometimes, the cause of wrist pain is not known. The pain often goes away when you follow your health care provider's instructions for relieving pain at home. If your wrist pain continues, tests may need to be done to diagnose your condition. HOME CARE INSTRUCTIONS Pay attention to any changes in your symptoms. Take these actions to help with your pain:  Rest the wrist area for at least 48 hours or as told by your health care provider.  If directed, apply ice to the injured area:  Put ice in a plastic bag.  Place a towel between your skin and the bag.  Leave the ice on for 20 minutes, 2-3 times per day.  Keep your arm raised (elevated) above the level of your heart while you are sitting or lying down.  If a splint or elastic bandage has been applied, use it as told by your health care provider.  Remove the splint or bandage only as told by your health care provider.  Loosen the splint or bandage if your fingers become numb or have a tingling feeling, or if they turn cold or blue.  Take over-the-counter and prescription medicines only as told by your health care provider.  Keep all follow-up visits as told by your health care provider. This is important. SEEK MEDICAL CARE IF:  Your pain is not helped by treatment.  Your pain gets worse. SEEK IMMEDIATE MEDICAL CARE IF:  Your fingers become swollen.  Your fingers turn white, very red, or cold and blue.  Your fingers are numb or have a tingling feeling.  You have difficulty moving your fingers.   This information is not intended to replace  advice given to you by your health care provider. Make sure you discuss any questions you have with your health care provider.   Document Released: 01/16/2005 Document Revised: 12/28/2014 Document Reviewed: 08/24/2014 Elsevier Interactive Patient Education 2016 [employer]  

## 2015-08-09 ENCOUNTER — Ambulatory Visit: Payer: Medicaid Other | Admitting: Internal Medicine

## 2015-08-10 ENCOUNTER — Emergency Department (HOSPITAL_COMMUNITY)
Admission: EM | Admit: 2015-08-10 | Discharge: 2015-08-10 | Disposition: A | Discharge status: Home / Self Care | Payer: Medicaid Other | Attending: Emergency Medicine | Admitting: Emergency Medicine

## 2015-08-10 ENCOUNTER — Ambulatory Visit: Payer: Medicaid Other | Admitting: Internal Medicine

## 2015-08-10 ENCOUNTER — Encounter (HOSPITAL_COMMUNITY): Payer: Self-pay | Admitting: Emergency Medicine

## 2015-08-10 ENCOUNTER — Emergency Department (HOSPITAL_COMMUNITY): Payer: Medicaid Other

## 2015-08-10 DIAGNOSIS — H5442 Blindness, left eye, normal vision right eye: Secondary | ICD-10-CM | POA: Insufficient documentation

## 2015-08-10 DIAGNOSIS — E785 Hyperlipidemia, unspecified: Secondary | ICD-10-CM | POA: Insufficient documentation

## 2015-08-10 DIAGNOSIS — Z862 Personal history of diseases of the blood and blood-forming organs and certain disorders involving the immune mechanism: Secondary | ICD-10-CM | POA: Diagnosis not present

## 2015-08-10 DIAGNOSIS — E119 Type 2 diabetes mellitus without complications: Secondary | ICD-10-CM | POA: Diagnosis not present

## 2015-08-10 DIAGNOSIS — Z791 Long term (current) use of non-steroidal anti-inflammatories (NSAID): Secondary | ICD-10-CM | POA: Diagnosis not present

## 2015-08-10 DIAGNOSIS — R5383 Other fatigue: Secondary | ICD-10-CM | POA: Diagnosis present

## 2015-08-10 DIAGNOSIS — H9190 Unspecified hearing loss, unspecified ear: Secondary | ICD-10-CM | POA: Diagnosis not present

## 2015-08-10 DIAGNOSIS — R6889 Other general symptoms and signs: Secondary | ICD-10-CM

## 2015-08-10 DIAGNOSIS — R05 Cough: Secondary | ICD-10-CM | POA: Diagnosis not present

## 2015-08-10 DIAGNOSIS — Z7984 Long term (current) use of oral hypoglycemic drugs: Secondary | ICD-10-CM | POA: Insufficient documentation

## 2015-08-10 DIAGNOSIS — R112 Nausea with vomiting, unspecified: Secondary | ICD-10-CM | POA: Insufficient documentation

## 2015-08-10 DIAGNOSIS — Z79899 Other long term (current) drug therapy: Secondary | ICD-10-CM | POA: Insufficient documentation

## 2015-08-10 DIAGNOSIS — J029 Acute pharyngitis, unspecified: Secondary | ICD-10-CM | POA: Insufficient documentation

## 2015-08-10 LAB — BASIC METABOLIC PANEL
ANION GAP: 11 (ref 5–15)
BUN: 14 mg/dL (ref 6–20)
CALCIUM: 8.7 mg/dL — AB (ref 8.9–10.3)
CO2: 23 mmol/L (ref 22–32)
Chloride: 98 mmol/L — ABNORMAL LOW (ref 101–111)
Creatinine, Ser: 0.87 mg/dL (ref 0.44–1.00)
Glucose, Bld: 393 mg/dL — ABNORMAL HIGH (ref 65–99)
Potassium: 4.4 mmol/L (ref 3.5–5.1)
Sodium: 132 mmol/L — ABNORMAL LOW (ref 135–145)

## 2015-08-10 LAB — CBC WITH DIFFERENTIAL/PLATELET
Basophils Absolute: 0 10*3/uL (ref 0.0–0.1)
Basophils Relative: 0 %
EOS ABS: 0.1 10*3/uL (ref 0.0–0.7)
EOS PCT: 2 %
HCT: 35 % — ABNORMAL LOW (ref 36.0–46.0)
Hemoglobin: 10.8 g/dL — ABNORMAL LOW (ref 12.0–15.0)
Lymphocytes Relative: 17 %
Lymphs Abs: 1.3 10*3/uL (ref 0.7–4.0)
MCH: 24.8 pg — ABNORMAL LOW (ref 26.0–34.0)
MCHC: 30.9 g/dL (ref 30.0–36.0)
MCV: 80.5 fL (ref 78.0–100.0)
MONO ABS: 0.6 10*3/uL (ref 0.1–1.0)
Monocytes Relative: 8 %
NEUTROS PCT: 73 %
Neutro Abs: 5.4 10*3/uL (ref 1.7–7.7)
PLATELETS: UNDETERMINED 10*3/uL (ref 150–400)
RBC: 4.35 MIL/uL (ref 3.87–5.11)
RDW: 14.4 % (ref 11.5–15.5)
WBC Morphology: INCREASED
WBC: 7.4 10*3/uL (ref 4.0–10.5)

## 2015-08-10 LAB — RAPID STREP SCREEN (MED CTR MEBANE ONLY): Streptococcus, Group A Screen (Direct): NEGATIVE

## 2015-08-10 LAB — I-STAT CG4 LACTIC ACID, ED: Lactic Acid, Venous: 1.69 mmol/L (ref 0.5–2.0)

## 2015-08-10 MED ORDER — IBUPROFEN 400 MG PO TABS: 400.0000 mg | ORAL_TABLET | Freq: Four times a day (QID) | ORAL | Status: DC | PRN

## 2015-08-10 MED ORDER — SODIUM CHLORIDE 0.9 % IV BOLUS (SEPSIS)
1000.0000 mL | Freq: Once | INTRAVENOUS | Status: AC
  Administered 2015-08-10: 1000 mL via INTRAVENOUS

## 2015-08-10 MED ORDER — ONDANSETRON 4 MG PO TBDP: 4.0000 mg | ORAL_TABLET | Freq: Three times a day (TID) | ORAL | Status: DC | PRN

## 2015-08-10 MED ORDER — ACETAMINOPHEN 325 MG PO TABS
650.0000 mg | ORAL_TABLET | Freq: Once | ORAL | Status: AC
  Administered 2015-08-10: 650 mg via ORAL

## 2015-08-10 MED ORDER — BENZONATATE 100 MG PO CAPS: 100.0000 mg | ORAL_CAPSULE | Freq: Three times a day (TID) | ORAL | Status: DC | PRN

## 2015-08-10 MED ORDER — ACETAMINOPHEN 325 MG PO TABS
ORAL_TABLET | ORAL | Status: AC
  Administered 2015-08-10: 650 mg via ORAL
  Filled 2015-08-10: qty 2

## 2015-08-10 NOTE — ED Provider Notes (Signed)
CSN: [id number]     Arrival date & time 08/10/15  0009 History   First MD Initiated Contact with Patient 08/10/15 [phone]     Chief Complaint  Patient presents with  . Cough  . Fatigue     (Consider location/radiation/quality/duration/timing/severity/associated sxs/prior Treatment) HPI  This is a 45 -year-old female with a history of diabetes who presents with cough, chest congestion, vomiting. Patient is here with her son who is translating. Reports 2 to three-day history of worsening cough, generalized myalgias, fatigue, vomiting. Denies diarrhea. Both the son in the father has been sick with similar symptoms. Patient is also reporting sore throat. She has felt "hot at home."  Reports that her blood sugars have been in the mid 200s.  Past Medical History  Diagnosis Date  . Routine/ritual circumcision   . Anemia, iron deficiency   . Glaucoma associated with ocular inflammation   . Uveitis   . Decreased visual acuity     Left eye  . Hair loss   . Hyperlipidemia   . Pap smear abnormality of cervix with LGSIL   . Type II diabetes mellitus ([place])   . Diabetes mellitus without complication ([place])   . Hypercholesteremia   . Blindness of left eye    Past Surgical History  Procedure Laterality Date  . Cesarean section     No family history on file. Social History  Substance Use Topics  . Smoking status: Never Smoker   . Smokeless tobacco: None  . Alcohol Use: No   OB History    Gravida Para Term Preterm AB TAB SAB Ectopic Multiple Living   7 4 4  3  3   4      Review of Systems  Constitutional: Positive for chills. Negative for fever.  HENT: Positive for sore throat.   Respiratory: Positive for cough. Negative for chest tightness and shortness of breath.   Cardiovascular: Negative for chest pain.  Gastrointestinal: Positive for nausea and vomiting. Negative for abdominal pain and diarrhea.  Genitourinary: Negative for dysuria.  Neurological: Negative for headaches.  All  other systems reviewed and are negative.     Allergies  Review of patient's allergies indicates no known allergies.  Home Medications   Prior to Admission medications   Medication Sig Start Date End Date Taking? Authorizing Provider  ACCU-CHEK SMARTVIEW test strip CHECK BLOOD SUGAR 4 TIMES DAILY BEFORE MEAL AND BEDTIME. 03/29/15   Juluis Mire, MD  atropine 1 % ophthalmic solution Place 1 drop into both eyes daily.     Historical Provider, MD  brimonidine (ALPHAGAN P) 0.1 % SOLN Place 1 drop into the left eye 2 (two) times daily.     Historical Provider, MD  dorzolamide-timolol (COSOPT) 22.3-6.8 MG/ML ophthalmic solution Place 1 drop into the left eye 2 (two) times daily.    Historical Provider, MD  gabapentin (NEURONTIN) 300 MG capsule Take 1 capsule (300 mg total) by mouth 3 (three) times daily. 04/11/15 04/10/16  Norman Herrlich, MD  HUMULIN N KWIKPEN 100 UNIT/ML Kiwkpen INJECT  60 UNITS SUBCUTANEOUSLY EVERY MORNING AND 40 UNITS EVERY EVENING 06/16/15   Juluis Mire, MD  Lancets (ACCU-CHEK SOFT TOUCH) lancets Check Blood Sugar 4 times daily before meals and at bedtime. Dx code: [id number] 09/23/14   Milagros Loll, MD  metFORMIN (GLUCOPHAGE) 500 MG tablet Take 1,000 mg by mouth 2 (two) times daily with a meal.    Historical Provider, MD  naproxen (NAPROSYN) 375 MG tablet Take 1 tablet (375 mg total) by  mouth 2 (two) times daily. 06/21/15   Margarita Mail, PA-C  oxyCODONE-acetaminophen (PERCOCET) 5-325 MG tablet Take 1-2 tablets by mouth every 4 (four) hours as needed. 06/21/15   Margarita Mail, PA-C  prednisoLONE acetate (PRED FORTE) 1 % ophthalmic suspension Place 1 drop into the right eye 2 (two) times daily.     Historical Provider, MD   BP 185/91 mmHg  Pulse 98  Temp(Src) 99.9 F (37.7 C) (Oral)  Resp 20  Ht 5\' 4"  (1.626 m)  Wt 197 lb (89.359 kg)  BMI 33.80 kg/m2  SpO2 100%  LMP 07/10/2015 Physical Exam  Constitutional: She is oriented to person, place, and time. No distress.   Ill-appearing but nontoxic, no acute distress  HENT:  Head: Normocephalic and atraumatic.  Mouth/Throat: No oropharyngeal exudate.  Palatal petechiae noted, no tonsillar exudate  Eyes: Pupils are equal, round, and reactive to light.  Neck: Normal range of motion. Neck supple.  Cardiovascular: Normal rate, regular rhythm and normal heart sounds.   No murmur heard. Pulmonary/Chest: Effort normal and breath sounds normal. No respiratory distress. She has no wheezes.  Abdominal: Soft. Bowel sounds are normal. There is no tenderness. There is no rebound.  Musculoskeletal: She exhibits no edema.  Neurological: She is alert and oriented to person, place, and time.  Skin: Skin is warm and dry.  Psychiatric: She has a normal mood and affect.  Nursing note and vitals reviewed.   ED Course  Procedures (including critical care time) Labs Review Labs Reviewed  CBC WITH DIFFERENTIAL/PLATELET - Abnormal; Notable for the following:    Hemoglobin 10.8 (*)    HCT 35.0 (*)    MCH 24.8 (*)    All other components within normal limits  BASIC METABOLIC PANEL - Abnormal; Notable for the following:    Sodium 132 (*)    Chloride 98 (*)    Glucose, Bld 393 (*)    Calcium 8.7 (*)    All other components within normal limits  RAPID STREP SCREEN (NOT AT [facility])  CULTURE, GROUP A STREP ([place])  I-STAT CG4 LACTIC ACID, ED    Imaging Review Dg Chest 2 View  08/10/2015  CLINICAL DATA:  Headache, cough, vomiting for 3 days. Chest congestion and fever. EXAM: CHEST  2 VIEW COMPARISON:  08/25/2009 FINDINGS: Shallow inspiration. Normal heart size and pulmonary vascularity. No focal airspace disease or consolidation in the lungs. No blunting of costophrenic angles. No pneumothorax. Mediastinal contours appear intact. Left cervical rib. IMPRESSION: No active cardiopulmonary disease. Electronically Signed   By: Lucienne Capers M.D.   On: 08/10/2015 00:53   I have personally reviewed and evaluated these images and  lab results as part of my medical decision-making.   EKG Interpretation None      MDM   Final diagnoses:  Flu-like symptoms    Patient presents with flulike symptoms. Sick contacts at home with similar symptoms. She is ill-appearing but nontoxic. Vital signs are reassuring. Low-grade temperature. Lab work notable for glucose of 393 without evidence of anion gap. Patient was given fluids and Tylenol. Chest x-rays, strep screen and other lab work is reassuring. Suspect acute viral illness. Influenza is also a consideration. However, patient is out of the window for Tamiflu. Patient was able to orally hydrate. She is able to ambulate without difficulty. Supportive measures at home.  After history, exam, and medical workup I feel the patient has been appropriately medically screened and is safe for discharge home. Pertinent diagnoses were discussed with the patient. Patient was  given return precautions.     Merryl Hacker, MD 08/10/15 [phone]

## 2015-08-10 NOTE — ED Notes (Addendum)
Pt ambulated with pulse ox. Sats 100%, HR 103. Ambulated well without assistance

## 2015-08-10 NOTE — Discharge Instructions (Signed)
Viral Infections °A viral infection can be caused by different types of viruses. Most viral infections are not serious and resolve on their own. However, some infections may cause severe symptoms and may lead to further complications. °SYMPTOMS °Viruses can frequently cause: °· Minor sore throat. °· Aches and pains. °· Headaches. °· Runny nose. °· Different types of rashes. °· Watery eyes. °· Tiredness. °· Cough. °· Loss of appetite. °· Gastrointestinal infections, resulting in nausea, vomiting, and diarrhea. °These symptoms do not respond to antibiotics because the infection is not caused by bacteria. However, you might catch a bacterial infection following the viral infection. This is sometimes called a "superinfection." Symptoms of such a bacterial infection may include: °· Worsening sore throat with pus and difficulty swallowing. °· Swollen neck glands. °· Chills and a high or persistent fever. °· Severe headache. °· Tenderness over the sinuses. °· Persistent overall ill feeling (malaise), muscle aches, and tiredness (fatigue). °· Persistent cough. °· Yellow, green, or brown mucus production with coughing. °HOME CARE INSTRUCTIONS  °· Only take over-the-counter or prescription medicines for pain, discomfort, diarrhea, or fever as directed by your caregiver. °· Drink enough water and fluids to keep your urine clear or pale yellow. Sports drinks can provide valuable electrolytes, sugars, and hydration. °· Get plenty of rest and maintain proper nutrition. Soups and broths with crackers or rice are fine. °SEEK IMMEDIATE MEDICAL CARE IF:  °· You have severe headaches, shortness of breath, chest pain, neck pain, or an unusual rash. °· You have uncontrolled vomiting, diarrhea, or you are unable to keep down fluids. °· You or your child has an oral temperature above 102° F (38.9° C), not controlled by medicine. °· Your baby is older than 3 months with a rectal temperature of 102° F (38.9° C) or higher. °· Your baby is 3  months old or younger with a rectal temperature of 100.4° F (38° C) or higher. °MAKE SURE YOU:  °· Understand these instructions. °· Will watch your condition. °· Will get help right away if you are not doing well or get worse. °  °This information is not intended to replace advice given to you by your health care provider. Make sure you discuss any questions you have with your health care provider. °  °Document Released: 01/16/2005 Document Revised: 07/01/2011 Document Reviewed: 09/14/2014 °Elsevier Interactive Patient Education ©2016 Elsevier Inc. ° °

## 2015-08-10 NOTE — ED Notes (Signed)
Pt. reports persistent dry cough with chest congestion and fatigue onset this week with low grade fever .

## 2015-08-12 LAB — CULTURE, GROUP A STREP (THRC)

## 2015-08-18 ENCOUNTER — Encounter: Payer: Medicaid Other | Admitting: Internal Medicine

## 2015-08-22 LAB — HM DIABETES EYE EXAM

## 2015-08-25 ENCOUNTER — Encounter: Payer: Self-pay | Admitting: Internal Medicine

## 2015-08-25 ENCOUNTER — Ambulatory Visit (INDEPENDENT_AMBULATORY_CARE_PROVIDER_SITE_OTHER): Payer: Medicaid Other | Admitting: Internal Medicine

## 2015-08-25 VITALS — BP 120/78 | HR 82 | Temp 98.5°F | Wt 203.8 lb

## 2015-08-25 DIAGNOSIS — E113212 Type 2 diabetes mellitus with mild nonproliferative diabetic retinopathy with macular edema, left eye: Secondary | ICD-10-CM | POA: Diagnosis not present

## 2015-08-25 DIAGNOSIS — H209 Unspecified iridocyclitis: Secondary | ICD-10-CM | POA: Insufficient documentation

## 2015-08-25 DIAGNOSIS — Z794 Long term (current) use of insulin: Secondary | ICD-10-CM

## 2015-08-25 DIAGNOSIS — H4042X3 Glaucoma secondary to eye inflammation, left eye, severe stage: Secondary | ICD-10-CM

## 2015-08-25 DIAGNOSIS — E113219 Type 2 diabetes mellitus with mild nonproliferative diabetic retinopathy with macular edema, unspecified eye: Secondary | ICD-10-CM

## 2015-08-25 DIAGNOSIS — Z7189 Other specified counseling: Secondary | ICD-10-CM | POA: Diagnosis not present

## 2015-08-25 DIAGNOSIS — Z6834 Body mass index (BMI) 34.0-34.9, adult: Secondary | ICD-10-CM | POA: Diagnosis not present

## 2015-08-25 LAB — GLUCOSE, CAPILLARY: Glucose-Capillary: 184 mg/dL — ABNORMAL HIGH (ref 65–99)

## 2015-08-25 LAB — POCT GLYCOSYLATED HEMOGLOBIN (HGB A1C): HEMOGLOBIN A1C: 13.1

## 2015-08-25 MED ORDER — EXENATIDE 5 MCG/0.02ML ~~LOC~~ SOPN: 5.0000 ug | PEN_INJECTOR | Freq: Two times a day (BID) | SUBCUTANEOUS | Status: DC

## 2015-08-25 MED ORDER — INSULIN ISOPHANE HUMAN 100 UNIT/ML KWIKPEN: PEN_INJECTOR | SUBCUTANEOUS | Status: DC

## 2015-08-25 MED ORDER — ATORVASTATIN CALCIUM 40 MG PO TABS: 40.0000 mg | ORAL_TABLET | Freq: Every day | ORAL | Status: DC

## 2015-08-25 MED ORDER — INSULIN PEN NEEDLE 31G X 5 MM MISC: 1.0000 | Freq: Two times a day (BID) | Status: DC

## 2015-08-25 NOTE — Patient Instructions (Signed)
-  Your A1c is 13.1 from 10.9 which is not at goal of less than 7.  -Start taking Byetta twice a day injection for your diabetes. Keep taking the Humulin 50 U in AM and 40 U in PM -Start taking atorvastatin (lipitor) 40 mg daily for high cholesterol  -Please come back in 1 month and check your blood sugar 2-3 times a day (especially in afternoon/bedtime) -Very nice seeing you!  General Instructions:   Please bring your medicines with you each time you come to clinic.  Medicines may include prescription medications, over-the-counter medications, herbal remedies, eye drops, vitamins, or other pills.   Progress Toward Treatment Goals:  Treatment Goal 09/23/2014  Hemoglobin A1C improved    Self Care Goals & Plans:  Self Care Goal 04/11/2015  Manage my medications take my medicines as prescribed; bring my medications to every visit; refill my medications on time; follow the sick day instructions if I am sick  Monitor my health keep track of my blood glucose; bring my glucose meter and log to each visit; check my feet daily  Eat healthy foods eat more vegetables; eat baked foods instead of fried foods; eat fruit for snacks and desserts; eat foods that are low in salt; eat smaller portions; drink diet soda or water instead of juice or soda  Be physically active find an activity I enjoy    Home Blood Glucose Monitoring 09/23/2014  Check my blood sugar 4 times a day  When to check my blood sugar before meals; at bedtime     Care Management & Community Referrals:  Referral 01/28/2013  Referrals made for care management support none needed

## 2015-08-25 NOTE — Progress Notes (Signed)
Byetta (exenatide) reviewed with the patient and son, including name, instructions, indication, goals of therapy, importance of adherence, and safe use.  Patient and son verbalized understanding by repeating back information and demonstrated technique with prompting but may benefit from further education in the future. Advised to contact me if further medication-related questions arise. Patient was also provided an information handout.

## 2015-08-25 NOTE — Progress Notes (Signed)
[name], female   DOB: 1970/08/05, 45 y.o.   MRN: [id number]   Subjective:   [name] female   DOB: [birth year] 45 y.o.   MRN: [id number]  HPI: Jennifer [name] is a 45 y.o. 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   History obtained from son who translated in Arabic. Pt did not want to use phone translator.   Her last A1c was 10.9 on 04/11/15. She is no longer on Vgo 20 but instead taking Humulin 50 U AM / 40 U PM and metformin 1000 mg BID.She checks her blood sugar one to two times daily in the morning and has brought her meter which reveals average blood glucose of 237 with range of 102-396. She denies symptomatic hypoglycemia, polydipsia, polyuria, polyphagia, or foot ulceration/injury. Her chronic peripheral neuropathy is controlled on gabapentin 300 mg BID. She tries to follow a healthy diet and walk regularly. She has gained 4 lb since last visit 6 months ago. She continues to have left eye pain, tearing, and photosensitivity. She just saw her ophthalmologist at [facility] on May 2nd and is to see an eye surgeon for surgery.   She is no longer taking atorvastatin for hyperlipidemia for unclear reasons.      Past Medical History  Diagnosis Date  . Routine/ritual circumcision   . Anemia, iron deficiency   . Glaucoma associated with ocular inflammation   . Uveitis   . Decreased visual acuity     Left eye  . Hair loss   . Hyperlipidemia   . Pap smear abnormality of cervix with LGSIL   . Type II diabetes mellitus ([place])   . Diabetes mellitus without complication ([place])   . Hypercholesteremia   . Blindness of left eye    Current Outpatient Prescriptions  Medication Sig Dispense Refill  . ACCU-CHEK SMARTVIEW test strip CHECK BLOOD SUGAR 4 TIMES DAILY BEFORE MEAL AND BEDTIME. 150 each  11  . atropine 1 % ophthalmic solution Place 1 drop into both eyes daily.     . benzonatate (TESSALON) 100 MG capsule Take 1 capsule (100 mg total) by mouth 3 (three) times daily as needed for cough. 21 capsule 0  . brimonidine (ALPHAGAN P) 0.1 % SOLN Place 1 drop into the left eye 2 (two) times daily.     . dorzolamide-timolol (COSOPT) 22.3-6.8 MG/ML ophthalmic solution Place 1 drop into the left eye 2 (two) times daily.    Marland Kitchen gabapentin (NEURONTIN) 300 MG capsule Take 1 capsule (300 mg total) by mouth 3 (three) times daily. 90 capsule 4  . HUMULIN N KWIKPEN 100 UNIT/ML Kiwkpen INJECT  60 UNITS SUBCUTANEOUSLY EVERY MORNING AND 40 UNITS EVERY EVENING 15 mL 2  . ibuprofen (ADVIL,MOTRIN) 400 MG tablet Take 1 tablet (400 mg total) by mouth every 6 (six) hours as needed. 30 tablet 0  . Lancets (ACCU-CHEK SOFT TOUCH) lancets Check Blood Sugar 4 times daily before meals and at bedtime. Dx code: E11.321 100 each 11  . metFORMIN (GLUCOPHAGE) 500 MG tablet Take 1,000 mg by mouth 2 (two) times daily with a meal.    . naproxen (NAPROSYN) 375 MG tablet Take 1 tablet (375 mg total) by mouth 2 (two) times daily. 20 tablet 0  . ondansetron (ZOFRAN ODT) 4 MG disintegrating tablet Take 1 tablet (4 mg total) by mouth every 8 (eight) hours as needed  for nausea or vomiting. 20 tablet 0  . oxyCODONE-acetaminophen (PERCOCET) 5-325 MG tablet Take 1-2 tablets by mouth every 4 (four) hours as needed. 20 tablet 0  . prednisoLONE acetate (PRED FORTE) 1 % ophthalmic suspension Place 1 drop into the right eye 2 (two) times daily.      No current facility-administered medications for this visit.   No family history on file. Social History   Social History  . Marital Status: Married    Spouse Name: N/A  . Number of Children: N/A  . Years of Education: N/A   Social History Main Topics  . Smoking status: Never Smoker   . Smokeless tobacco: None  . Alcohol Use: No  . Drug Use: No  . Sexual Activity: Not Currently    Other Topics Concern  . None   Social History Narrative   ** Merged History Encounter **       Pt. plans to sue [employer] for testing supplies [phone] [name] July 28,2010 2:10PM      Pt is [place] Arabic.    Review of Systems: Review of Systems  Constitutional: Negative for fever, chills and weight loss.  Eyes: Positive for photophobia (left), pain (left) and discharge (left).  Respiratory: Negative for cough, shortness of breath and wheezing.   Cardiovascular: Negative for chest pain and leg swelling.  Gastrointestinal: Negative for nausea, vomiting, abdominal pain, diarrhea and constipation.  Genitourinary: Negative for dysuria, urgency and frequency.  Neurological: Positive for headaches. Negative for dizziness.  Endo/Heme/Allergies: Negative for polydipsia.     Objective:  Physical Exam: Filed Vitals:   08/25/15 1521  BP: 120/78  Pulse: 82  Temp: 98.5 F (36.9 C)  TempSrc: Oral  Weight: 203 lb 12.8 oz (92.443 kg)  SpO2: 100%    Physical Exam  Constitutional: She is oriented to person, place, and time. She appears well-developed and well-nourished. No distress.  HENT:  Head: Normocephalic and atraumatic.  Right Ear: External ear normal.  Left Ear: External ear normal.  Nose: Nose normal.  Mouth/Throat: Oropharynx is clear and moist. No oropharyngeal exudate.  Eyes: EOM are normal.  Did not examine eyes due to photosensitivity   Neck: Normal range of motion. Neck supple.  Cardiovascular: Normal rate and regular rhythm.   Pulmonary/Chest: Effort normal and breath sounds normal. No respiratory distress. She has no wheezes. She has no rales.  Abdominal: Soft. Bowel sounds are normal. She exhibits no distension. There is no tenderness. There is no rebound and no guarding.  Musculoskeletal: Normal range of motion. She exhibits no edema or tenderness.  Neurological: She is alert and oriented to person, place, and time.  Skin: Skin is warm and dry. No  rash noted. She is not diaphoretic. No erythema. No pallor.  Psychiatric: She has a normal mood and affect. Her behavior is normal. Judgment and thought content normal.    Assessment & Plan:   Please see problem list for problem-based assessment and plan

## 2015-08-25 NOTE — Assessment & Plan Note (Addendum)
Assessment: Pt with last A1c 10.9 on 04/11/15 compliant with oral hypoglycemic and insulin therapy with no recent symptomatic hypoglycemia who presents with CBG of 184 and worsened A1c of 13.1.    Plan:  -A1c 13.1 not at goal <7. Start byetta 5 mcg BID with meals (Dr. Maudie Mercury demonstrated use to patient) and continue Humulin 50 U AM / 40 U PM and metformin 1000 mg BID.  -BP 120/78 at goal <140/90 not on anti-hypertensive therapy, will add low dose lisinopril at next visit if proteinuria confirmed   -LDL 121 not at goal <100, refill high intensity atorvastatin 40 mg daily -Last annual eye exam on 08/22/15 with retinopathy, follows with [place] annual foot exam on 04/11/15 -Last annual urine microalbumin on 02/24/15 with 40.6 mg of proteinuria, repeat to confirm, if persistent will add low dose lisinopril at next visit  -Continue gabapentin 300 mg BID (cannot tolerate TID) for chronic peripheral neuropathy  -BMI 34.96 not at goal <25, encourage weight loss (byetta will also help)

## 2015-08-26 LAB — MICROALBUMIN / CREATININE URINE RATIO
CREATININE, UR: 239 mg/dL
MICROALB/CREAT RATIO: 30.1 mg/g{creat} — AB (ref 0.0–30.0)
Microalbumin, Urine: 72 ug/mL

## 2015-08-28 ENCOUNTER — Other Ambulatory Visit: Payer: Self-pay

## 2015-08-28 NOTE — Telephone Encounter (Signed)
Spoke with patients son Jennifer [name], he is stating that Jennifer [name] has told them that PCP office needs to call insurance.  We have tried to contact [facility] to speak with someone to gain more information as to what needs to be done and which insulin pen is creating the problem but have been unable to get through their lines.    Advised son that we would try again first thing tomorrow.

## 2015-08-28 NOTE — Telephone Encounter (Signed)
Pt requesting insulin to be filled. Also please call pt back.

## 2015-08-29 NOTE — Telephone Encounter (Signed)
Medicaid requesting prior auth. For Byetta pen before being filled.

## 2015-08-29 NOTE — Telephone Encounter (Signed)
Spoke with patient's son and Ahmed, to let him know we were working on the prior auth.

## 2015-08-29 NOTE — Telephone Encounter (Signed)
REQUEST APPROVED:   APPROVED 08/29/2015 - 08/28/2016  Will contact pt and pharmacy.Despina Hidden Cassady5/9/20171:54 PM

## 2015-08-29 NOTE — Telephone Encounter (Signed)
PA completed online via [employer].  Request sent for "review".  Byetta is preferred if patient failed metformin.    [id number] W  PHARMACY  [id number] L Daylene Katayama

## 2015-08-30 ENCOUNTER — Other Ambulatory Visit: Payer: Self-pay | Admitting: Internal Medicine

## 2015-08-30 NOTE — Addendum Note (Signed)
Addended by: Oval Linsey D on: 08/30/2015 11:36 AM   Modules accepted: Level of Service

## 2015-08-30 NOTE — Progress Notes (Signed)
Case discussed with Dr. Rabbani soon after the resident saw the patient.  We reviewed the resident's history and exam and pertinent patient test results.  I agree with the assessment, diagnosis and plan of care documented in the resident's note. 

## 2015-08-31 NOTE — Telephone Encounter (Signed)
Removed from list 05/13/2015 at ER visit.

## 2015-09-01 ENCOUNTER — Other Ambulatory Visit: Payer: Self-pay | Admitting: Internal Medicine

## 2015-09-01 ENCOUNTER — Telehealth: Payer: Self-pay | Admitting: Dietician

## 2015-09-01 NOTE — Telephone Encounter (Signed)
Daughter called about the Yvonne Kendall says the patient doesn't want to use it anymore because her Her wrist and shoulder are in pain. Told the daughter she can call the company and stop them from being delivered. Also the daughter asked for [place] to call [facility] to have patient's insulin filled. [facility] phone number given to daughter to call to request insulin and Byetta refills.  green one she gets from [place].

## 2015-09-04 NOTE — Telephone Encounter (Signed)
Sorry for the error

## 2015-09-04 NOTE — Telephone Encounter (Signed)
Could you schedule this patient for an appointment and Vitamin D recheck? Thanks!

## 2015-09-07 ENCOUNTER — Encounter: Payer: Self-pay | Admitting: *Deleted

## 2015-09-18 ENCOUNTER — Other Ambulatory Visit: Payer: Self-pay | Admitting: Internal Medicine

## 2015-09-22 ENCOUNTER — Ambulatory Visit (INDEPENDENT_AMBULATORY_CARE_PROVIDER_SITE_OTHER): Payer: Medicaid Other | Admitting: Internal Medicine

## 2015-09-22 ENCOUNTER — Encounter: Payer: Self-pay | Admitting: Internal Medicine

## 2015-09-22 VITALS — BP 123/68 | HR 88 | Temp 98.1°F | Wt 196.3 lb

## 2015-09-22 DIAGNOSIS — E113219 Type 2 diabetes mellitus with mild nonproliferative diabetic retinopathy with macular edema, unspecified eye: Secondary | ICD-10-CM

## 2015-09-22 DIAGNOSIS — Z0000 Encounter for general adult medical examination without abnormal findings: Secondary | ICD-10-CM

## 2015-09-22 DIAGNOSIS — E113212 Type 2 diabetes mellitus with mild nonproliferative diabetic retinopathy with macular edema, left eye: Secondary | ICD-10-CM | POA: Diagnosis not present

## 2015-09-22 DIAGNOSIS — L298 Other pruritus: Secondary | ICD-10-CM | POA: Diagnosis not present

## 2015-09-22 DIAGNOSIS — Z794 Long term (current) use of insulin: Secondary | ICD-10-CM | POA: Diagnosis not present

## 2015-09-22 DIAGNOSIS — N898 Other specified noninflammatory disorders of vagina: Secondary | ICD-10-CM

## 2015-09-22 MED ORDER — FLUCONAZOLE 150 MG PO TABS: 150.0000 mg | ORAL_TABLET | Freq: Once | ORAL | Status: DC

## 2015-09-22 MED ORDER — INSULIN ISOPHANE HUMAN 100 UNIT/ML KWIKPEN: PEN_INJECTOR | SUBCUTANEOUS | Status: DC

## 2015-09-22 NOTE — Patient Instructions (Addendum)
-  Decrease your insulin to 40 U twice a day. Don't take the byetta anymore.  -We will call you when your new insulin comes -I'll refill your cholesterol medication to your pharmacy -Take diflucan once for the vaginal itching  -Please come back in 2 weeks -Will miss you, pleasure being your doctor!  General Instructions:   Please bring your medicines with you each time you come to clinic.  Medicines may include prescription medications, over-the-counter medications, herbal remedies, eye drops, vitamins, or other pills.   Progress Toward Treatment Goals:  Treatment Goal 09/23/2014  Hemoglobin A1C improved    Self Care Goals & Plans:  Self Care Goal 04/11/2015  Manage my medications take my medicines as prescribed; bring my medications to every visit; refill my medications on time; follow the sick day instructions if I am sick  Monitor my health keep track of my blood glucose; bring my glucose meter and log to each visit; check my feet daily  Eat healthy foods eat more vegetables; eat baked foods instead of fried foods; eat fruit for snacks and desserts; eat foods that are low in salt; eat smaller portions; drink diet soda or water instead of juice or soda  Be physically active find an activity I enjoy    Home Blood Glucose Monitoring 09/23/2014  Check my blood sugar 4 times a day  When to check my blood sugar before meals; at bedtime     Care Management & Community Referrals:  Referral 01/28/2013  Referrals made for care management support none needed

## 2015-09-23 DIAGNOSIS — N898 Other specified noninflammatory disorders of vagina: Secondary | ICD-10-CM | POA: Insufficient documentation

## 2015-09-23 NOTE — Progress Notes (Signed)
[name], female   DOB: [birth year], 45 y.o.   MRN: [id number]    Subjective:   [name] female   DOB: [birth year] 45 y.o.   MRN: [id number]  HPI: [name] is a 45 y.o.  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   History obtained from daughter who translated in Arabic. Pt did not want to use phone translator.   Her last A1c was 13.1 on 08/25/15. At last visit she was started on byetta and continued taking Humulin 50 U AM / 40 U PM and metformin 1000 mg BID.She reports having hypoglycemic episode with CBG 71 and then falling but not passing out. She is no longer taking byetta. She checks her blood sugar once daily and has brought her meter which reveals average blood glucose of 249 with range of 64-486. She reports polydipsia and polyuria but denies polyphagia or foot ulceration/injury. She has chronic blurry vision with severe uveitis of her left eye and is followed by ophthalmologist at [facility]. Her chronic peripheral neuropathy is controlled on gabapentin 300 mg BID. She tries to follow a healthy diet and walk regularly. She has lost 7 lb since last visit 1 month ago.  She reports vaginal itching with burning at times but denies urinary frequency, urgency, or suprapubic pain. She denies cervical discharge.   She still has not picked up atorvastatin for hyperlipidemia.    Past Medical History  Diagnosis Date  . Routine/ritual circumcision   . Anemia, iron deficiency   . Glaucoma associated with ocular inflammation   . Uveitis   . Decreased visual acuity     Left eye  . Hair loss   . Hyperlipidemia   . Pap smear abnormality of cervix with LGSIL   . Type II diabetes mellitus ([place])   . Diabetes mellitus without complication ([place])   . Hypercholesteremia    . Blindness of left eye    Current Outpatient Prescriptions  Medication Sig Dispense Refill  . ACCU-CHEK SMARTVIEW test strip CHECK BLOOD SUGAR 4 TIMES DAILY BEFORE MEAL AND BEDTIME. 150 each 11  . acetaminophen (TYLENOL) 500 MG tablet Take 1,000 mg by mouth every 6 (six) hours as needed.    Marland Kitchen atorvastatin (LIPITOR) 40 MG tablet Take 1 tablet (40 mg total) by mouth daily. 30 tablet 5  . atropine 1 % ophthalmic solution Place 1 drop into both eyes daily.     . brimonidine (ALPHAGAN P) 0.1 % SOLN Place 1 drop into the left eye 2 (two) times daily.     . dorzolamide-timolol (COSOPT) 22.3-6.8 MG/ML ophthalmic solution Place 1 drop into the left eye 2 (two) times daily.    . fluconazole (DIFLUCAN) 150 MG tablet Take 1 tablet (150 mg total) by mouth once. Take for vaginal itching 1 tablet 0  . gabapentin (NEURONTIN) 300 MG capsule Take 1 capsule (300 mg total) by mouth 3 (three) times daily. (Patient taking differently: Take 300 mg by mouth 2 (two) times daily. ) 90 capsule 4  . Insulin NPH, Human,, Isophane, (HUMULIN N KWIKPEN) 100 UNIT/ML Kiwkpen INJECT  40 UNITS SUBCUTANEOUSLY EVERY MORNING AND 40 UNITS EVERY EVENING 15 mL 2  . Insulin Pen Needle 31G X 5 MM MISC 1 Dose by Does not apply route 2 (two) times daily. 100 each 11  . Lancets (ACCU-CHEK SOFT TOUCH)  lancets Check Blood Sugar 4 times daily before meals and at bedtime. Dx code: E11.321 100 each 11  . metFORMIN (GLUCOPHAGE) 500 MG tablet Take 1,000 mg by mouth 2 (two) times daily with a meal.    . ondansetron (ZOFRAN ODT) 4 MG disintegrating tablet Take 1 tablet (4 mg total) by mouth every 8 (eight) hours as needed for nausea or vomiting. 20 tablet 0  . prednisoLONE acetate (PRED FORTE) 1 % ophthalmic suspension Place 1 drop into the right eye 2 (two) times daily.     . vitamin C (ASCORBIC ACID) 500 MG tablet Take 500 mg by mouth daily.     No current facility-administered medications for this visit.   No family history on file. Social  History   Social History  . Marital Status: Married    Spouse Name: N/A  . Number of Children: N/A  . Years of Education: N/A   Social History Main Topics  . Smoking status: Never Smoker   . Smokeless tobacco: None  . Alcohol Use: No  . Drug Use: No  . Sexual Activity: Not Currently   Other Topics Concern  . None   Social History Narrative   ** Merged History Encounter **       Pt. plans to sue [employer] for testing supplies [phone] Jennifer [name] July 28,2010 2:10PM      Pt is [place].    Review of Systems: Review of Systems  Constitutional: Positive for weight loss and malaise/fatigue. Negative for fever and chills.  Eyes: Positive for blurred vision (chronic) and pain (chronic left eye). Negative for discharge.  Respiratory: Negative for cough, shortness of breath and wheezing.   Cardiovascular: Negative for chest pain and leg swelling.  Gastrointestinal: Positive for nausea (resolved) and vomiting (resolved). Negative for abdominal pain, diarrhea and constipation.  Genitourinary: Positive for dysuria (occasional ). Negative for urgency, frequency and hematuria.       Vaginal itching  Musculoskeletal: Positive for falls (due to hypoglycemia ). Negative for myalgias.  Neurological: Positive for sensory change. Negative for dizziness, loss of consciousness and headaches.  Endo/Heme/Allergies: Positive for polydipsia.     Objective:  Physical Exam: Filed Vitals:   09/22/15 1544  BP: 123/68  Pulse: 88  Temp: 98.1 F (36.7 C)  TempSrc: Oral  Weight: 196 lb 4.8 oz (89.041 kg)  SpO2: 99%    Physical Exam  Constitutional: She is oriented to person, place, and time. She appears well-developed and well-nourished. No distress.  Head scarf on  HENT:  Head: Normocephalic and atraumatic.  Right Ear: External ear normal.  Left Ear: External ear normal.  Nose: Nose normal.  Mouth/Throat: Oropharynx is clear and moist. No oropharyngeal exudate.  Eyes:  Conjunctivae and EOM are normal. Pupils are equal, round, and reactive to light. Right eye exhibits no discharge. Left eye exhibits no discharge. No scleral icterus.  Neck: Normal range of motion. Neck supple.  Cardiovascular: Normal rate, regular rhythm and normal heart sounds.   Pulmonary/Chest: Effort normal and breath sounds normal.  Abdominal: Soft. Bowel sounds are normal. She exhibits no distension. There is no tenderness. There is no rebound and no guarding.  Genitourinary:  Declined pelvic examination   Musculoskeletal: Normal range of motion. She exhibits no edema or tenderness.  Neurological: She is alert and oriented to person, place, and time.  Skin: Skin is warm and dry. No rash noted. She is not diaphoretic. No erythema. No pallor.  Psychiatric: She has a normal mood and affect.  Her behavior is normal. Judgment and thought content normal.    Assessment & Plan:   Please see problem list for problem-based assessment and plan

## 2015-09-23 NOTE — Assessment & Plan Note (Addendum)
-  Repeat 25-OH vitamin D level at next visit, if improved to >20 can start maintenance therapy with vitamin D 2000 U daily  -Repeat ferritin at next visit, was 5 on 10/28/14, if still iron-deficient will need to restart iron supplementation in setting of chronic fatigue

## 2015-09-23 NOTE — Assessment & Plan Note (Addendum)
Assessment: Pt with last A1c 13.1 on 08/25/15 compliant with oral hypoglycemic, GLP-1 agonist therapy, and insulin therapy with recent symptomatic hypoglycemia who presents with average home CBG of 249.     Plan:  -A1c 13.1 not at goal <7. Due to symptomatic hypoglycemia will decrease Humulin 50 U AM/40 U PM to 40 U BID. Will discontinue byetta 5 mcg BID due to increased risk of hypoglycemia. Dr Maudie Mercury to see if her insurance will approve of Xultophy, when approved will discontinue her insulin since it contains long acting insulin in addition to GLP-1 agonist. Continue metformin 1000 mg BID.  -BP 123/68 at goal <140/90 not on anti-hypertensive therapy  -LDL 121 not at goal <100,  Pt instructed to pick up recently prescribed atorvastatin 40 mg daily -Last annual eye exam on 08/22/15 with retinopathy, follows with [facility] annual foot exam on 04/11/15 -Last annual urine microalbumin on 02/24/15 with 30.1 mg of proteinuria, if persistent will consider adding low dose lisinopril   -Continue gabapentin 300 mg BID (cannot tolerate TID) for chronic peripheral neuropathy  -BMI 33.68 not at goal <25, encourage weight loss (xultophy will help)

## 2015-09-23 NOTE — Assessment & Plan Note (Signed)
Assessment: Pt with vaginal itching in setting of uncontrolled diabetes most likely due to probable vulvovaginal candidiasis.   Plan:  -Declined pelvic examination -Prescribe fluconazole 150 mg once  -If symptoms do not resolve obtain UA

## 2015-09-25 NOTE — Progress Notes (Signed)
[facility] Medicine Clinic Attending  Case discussed with Dr. Rabbani soon after the resident saw the patient.  We reviewed the resident's history and exam and pertinent patient test results.  I agree with the assessment, diagnosis, and plan of care documented in the resident's note.  

## 2015-09-27 ENCOUNTER — Telehealth: Payer: Self-pay | Admitting: Dietician

## 2015-09-27 NOTE — Telephone Encounter (Signed)
Asked this company to stop sending patient the VGo as she is not longer using it.

## 2015-10-03 ENCOUNTER — Telehealth: Payer: Self-pay | Admitting: Dietician

## 2015-10-03 ENCOUNTER — Other Ambulatory Visit: Payer: Self-pay | Admitting: Internal Medicine

## 2015-10-03 DIAGNOSIS — N898 Other specified noninflammatory disorders of vagina: Secondary | ICD-10-CM

## 2015-10-03 NOTE — Telephone Encounter (Signed)
Her daughter called with questions about her medicine: [phone]. Returned her call:no answer.  Called again, the daughter says that Jennifer [name] was supposed to get - "vitamins, insulin and a cream" after her last visit here and [facility] says they did not get the prescriptions. Note the [facility] prescription was set to "no print" and was not enough insulin being dispensed ( she needs 30 mL/month)  Discussed with triage.

## 2015-10-03 NOTE — Telephone Encounter (Signed)
Dr Naaman Plummer, insulin did not go to pharm, also the dispense amt for 1 month should be 11ml not 15, i have called it in but will you change the medlist

## 2015-10-04 MED ORDER — INSULIN ISOPHANE HUMAN 100 UNIT/ML KWIKPEN: PEN_INJECTOR | SUBCUTANEOUS | Status: DC

## 2015-10-04 NOTE — Telephone Encounter (Signed)
Yes just changed it on her med list. Per Bonnita Nasuti she did pick up the diflucan for vaginal itching. I did not prescribe her vitamins but had told her we would recheck her vitamin D level next time to see what dose she needs to be on. I did want her to pick up the lipitor that she had not yet picked up last time per pharmacy. Thanks!  Dr. Naaman Plummer

## 2015-10-04 NOTE — Addendum Note (Signed)
Addended byJuluis Mire on: 10/04/2015 10:56 AM   Modules accepted: Orders

## 2015-10-06 ENCOUNTER — Encounter: Payer: Self-pay | Admitting: Internal Medicine

## 2015-10-06 ENCOUNTER — Ambulatory Visit (INDEPENDENT_AMBULATORY_CARE_PROVIDER_SITE_OTHER): Payer: Medicaid Other | Admitting: Internal Medicine

## 2015-10-06 VITALS — BP 110/62 | HR 92 | Temp 98.5°F | Wt 196.1 lb

## 2015-10-06 DIAGNOSIS — E113212 Type 2 diabetes mellitus with mild nonproliferative diabetic retinopathy with macular edema, left eye: Secondary | ICD-10-CM | POA: Diagnosis present

## 2015-10-06 DIAGNOSIS — D509 Iron deficiency anemia, unspecified: Secondary | ICD-10-CM | POA: Diagnosis not present

## 2015-10-06 DIAGNOSIS — E559 Vitamin D deficiency, unspecified: Secondary | ICD-10-CM

## 2015-10-06 DIAGNOSIS — Z794 Long term (current) use of insulin: Secondary | ICD-10-CM

## 2015-10-06 LAB — GLUCOSE, CAPILLARY: GLUCOSE-CAPILLARY: 218 mg/dL — AB (ref 65–99)

## 2015-10-06 MED ORDER — METFORMIN HCL 1000 MG PO TABS: 1000.0000 mg | ORAL_TABLET | Freq: Two times a day (BID) | ORAL | Status: DC

## 2015-10-06 NOTE — Patient Instructions (Signed)
General Instructions:  I want you to restart taking Metformin,  Please call us if you have low blood sugars.  I am going to check you Vitamin D levels and Iron levels today.  Please bring your medicines with you each time you come to clinic.  Medicines may include prescription medications, over-the-counter medications, herbal remedies, eye drops, vitamins, or other pills.   Progress Toward Treatment Goals:  Treatment Goal 09/23/2014  Hemoglobin A1C improved    Self Care Goals & Plans:  Self Care Goal 10/06/2015  Manage my medications take my medicines as prescribed; bring my medications to every visit; refill my medications on time  Monitor my health keep track of my blood pressure; bring my glucose meter and log to each visit; keep track of my blood glucose; check my feet daily  Eat healthy foods eat more vegetables; eat foods that are low in salt; eat baked foods instead of fried foods  Be physically active find an activity I enjoy  Prevent falls have my vision checked    Home Blood Glucose Monitoring 09/23/2014  Check my blood sugar 4 times a day  When to check my blood sugar before meals; at bedtime     Care Management & Community Referrals:  Referral 01/28/2013  Referrals made for care management support none needed

## 2015-10-06 NOTE — Progress Notes (Signed)
[facility] Subjective:   [name] female   DOB: 02-09-1971 45 y.o.   MRN: [id number]  HPI: [name] is a 45 y.o. female with a PMH detailed below who presents for 2 week follow up of uncontrolled DM.  Please see problem based charting below for the status of her chronic medical problems.    Past Medical History  Diagnosis Date  . Routine/ritual circumcision   . Anemia, iron deficiency   . Glaucoma associated with ocular inflammation   . Uveitis   . Decreased visual acuity     Left eye  . Hair loss   . Hyperlipidemia   . Pap smear abnormality of cervix with LGSIL   . Type II diabetes mellitus ([place])   . Diabetes mellitus without complication ([place])   . Hypercholesteremia   . Blindness of left eye    Current Outpatient Prescriptions  Medication Sig Dispense Refill  . ACCU-CHEK SMARTVIEW test strip CHECK BLOOD SUGAR 4 TIMES DAILY BEFORE MEAL AND BEDTIME. 150 each 11  . acetaminophen (TYLENOL) 500 MG tablet Take 1,000 mg by mouth every 6 (six) hours as needed.    Marland Kitchen atorvastatin (LIPITOR) 40 MG tablet Take 1 tablet (40 mg total) by mouth daily. 30 tablet 5  . atropine 1 % ophthalmic solution Place 1 drop into both eyes daily.     . brimonidine (ALPHAGAN P) 0.1 % SOLN Place 1 drop into the left eye 2 (two) times daily.     . dorzolamide-timolol (COSOPT) 22.3-6.8 MG/ML ophthalmic solution Place 1 drop into the left eye 2 (two) times daily.    . fluconazole (DIFLUCAN) 150 MG tablet Take 1 tablet (150 mg total) by mouth once. Take for vaginal itching 1 tablet 0  . gabapentin (NEURONTIN) 300 MG capsule Take 1 capsule (300 mg total) by mouth 3 (three) times daily. (Patient taking differently: Take 300 mg by mouth 2 (two) times daily. ) 90 capsule 4  . Insulin NPH, Human,, Isophane, (HUMULIN N KWIKPEN) 100 UNIT/ML Kiwkpen INJECT  40 UNITS SUBCUTANEOUSLY EVERY MORNING AND 40 UNITS EVERY EVENING 30 mL 2  . Insulin Pen Needle 31G X 5 MM MISC 1  Dose by Does not apply route 2 (two) times daily. 100 each 11  . Lancets (ACCU-CHEK SOFT TOUCH) lancets Check Blood Sugar 4 times daily before meals and at bedtime. Dx code: E11.321 100 each 11  . metFORMIN (GLUCOPHAGE) 1000 MG tablet Take 1 tablet (1,000 mg total) by mouth 2 (two) times daily with a meal. 60 tablet 5  . prednisoLONE acetate (PRED FORTE) 1 % ophthalmic suspension Place 1 drop into the right eye 2 (two) times daily.     . vitamin C (ASCORBIC ACID) 500 MG tablet Take 500 mg by mouth daily.     No current facility-administered medications for this visit.   No family history on file. Social History   Social History  . Marital Status: Married    Spouse Name: N/A  . Number of Children: N/A  . Years of Education: N/A   Social History Main Topics  . Smoking status: Never Smoker   . Smokeless tobacco: None  . Alcohol Use: No  . Drug Use: No  . Sexual Activity: Not Currently   Other Topics Concern  . None   Social History Narrative   ** Merged History Encounter **       Pt. plans to sue [employer] for testing supplies [phone] Jennifer [name] July 28,2010 2:10PM  Pt is [place].    Review of Systems: Review of Systems  Constitutional: Positive for malaise/fatigue.  Eyes: Positive for blurred vision.  Respiratory: Negative for cough and shortness of breath.   Cardiovascular: Negative for chest pain.  Gastrointestinal: Negative for diarrhea, constipation and blood in stool.  Genitourinary: Negative for dysuria.  Musculoskeletal: Negative for myalgias.  Neurological: Positive for tingling (feet).     Objective:  Physical Exam: Filed Vitals:   10/06/15 1327  BP: 110/62  Pulse: 92  Temp: 98.5 F (36.9 C)  TempSrc: Oral  Weight: 196 lb 1.6 oz (88.95 kg)  SpO2: 100%  Physical Exam  Constitutional: She is well-developed, well-nourished, and in no distress.  In head wrap, fully covered  Cardiovascular: Normal rate and regular rhythm.    Pulmonary/Chest: Effort normal and breath sounds normal.  Nursing note and vitals reviewed.   Assessment & Plan:  Case discussed with Dr. 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  I gather that V-Go was not a great option because she fasts because of her religion.  In addition she has not been taking metformin, it appears she was just confused about this, she has not had side effects from metformin.  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 metFORMIN (GLUCOPHAGE) 1000 MG tablet    Sig: Take 1 tablet (1,000 mg total) by mouth 2 (two) times daily with a meal.    Dispense:  60 tablet    Refill:  5   Other Orders Orders Placed This Encounter  Procedures  . Glucose, capillary  . Ferritin  . Vitamin D (25 hydroxy)   Follow Up: Return in about 6 weeks (around 11/17/2015).

## 2015-10-06 NOTE — Progress Notes (Signed)
Medicine attending: Medical history, presenting problems, physical findings, and medications, reviewed with resident physician Dr Erik Hoffman on the day of the patient visit and I concur with his evaluation and management plan. 

## 2015-10-06 NOTE — Assessment & Plan Note (Addendum)
HPI: Reports chronic fatigue, has history of IDA, per PCP, wanted ferritin check at next visit  A: Iron deficiency anemia  P: Recheck ferritin  ADDENDUM: Ferretin still low, will place back on ferrous sulfate daily recheck in about 3 months.

## 2015-10-06 NOTE — Assessment & Plan Note (Signed)
HPI: She was previously placed on V-GO and byetta, she had hypoglycemia on this combination and she was changed back to the Numulin Kiwipen 2 weeks ago,  She has done much better on this since that time but lost her meter for a while (review of the meter shows >400 readings before 6/6 and then last reading was 193 on 6/16 (today).  I gather that V-Go was not a great option because she fasts because of her religion.  In addition she has not been taking metformin, it appears she was just confused about this, she has not had side effects from metformin.  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

## 2015-10-06 NOTE — Assessment & Plan Note (Addendum)
HPI: Has history of Vit D def, limited exposure to sunlight, reports chronic fatigue  A: Vit D def  P: Recheck Vit D levels  ADDENDUM: Vit D low, will start 50,000 units weekly for 10 weeks then will need 1000-2000 daily after, discussed with patient and son over the phone and sent Rx to [place]

## 2015-10-07 LAB — FERRITIN: Ferritin: 11 ng/mL — ABNORMAL LOW (ref 15–150)

## 2015-10-07 LAB — VITAMIN D 25 HYDROXY (VIT D DEFICIENCY, FRACTURES): Vit D, 25-Hydroxy: 16.9 ng/mL — ABNORMAL LOW (ref 30.0–100.0)

## 2015-10-08 ENCOUNTER — Other Ambulatory Visit: Payer: Self-pay | Admitting: Internal Medicine

## 2015-10-09 ENCOUNTER — Other Ambulatory Visit: Payer: Self-pay | Admitting: Pulmonary Disease

## 2015-10-09 MED ORDER — FERROUS SULFATE 325 (65 FE) MG PO TABS: 325.0000 mg | ORAL_TABLET | Freq: Every day | ORAL | Status: DC

## 2015-10-09 MED ORDER — ERGOCALCIFEROL 1.25 MG (50000 UT) PO CAPS: 50000.0000 [IU] | ORAL_CAPSULE | ORAL | Status: DC

## 2015-10-09 NOTE — Telephone Encounter (Signed)
Called to follow up to be sure patient got insulin and Lipitor. Left message for call back if she did not.

## 2015-10-09 NOTE — Addendum Note (Signed)
Addended by: Joni Reining C on: 10/09/2015 02:15 PM   Modules accepted: Orders

## 2015-10-11 ENCOUNTER — Encounter: Payer: Self-pay | Admitting: *Deleted

## 2015-10-11 NOTE — Telephone Encounter (Addendum)
Daughter called and left message that patient needs test strips. Called back and there was no answer. Called home number and left message that the prescription was sent to the pharmacy.

## 2015-10-16 DIAGNOSIS — M5442 Lumbago with sciatica, left side: Secondary | ICD-10-CM | POA: Diagnosis not present

## 2015-10-16 DIAGNOSIS — W109XXA Fall (on) (from) unspecified stairs and steps, initial encounter: Secondary | ICD-10-CM | POA: Diagnosis not present

## 2015-10-16 DIAGNOSIS — M25511 Pain in right shoulder: Secondary | ICD-10-CM | POA: Diagnosis not present

## 2015-10-16 DIAGNOSIS — Y9289 Other specified places as the place of occurrence of the external cause: Secondary | ICD-10-CM | POA: Diagnosis not present

## 2015-10-16 DIAGNOSIS — Y9301 Activity, walking, marching and hiking: Secondary | ICD-10-CM | POA: Insufficient documentation

## 2015-10-16 DIAGNOSIS — L02415 Cutaneous abscess of right lower limb: Secondary | ICD-10-CM | POA: Insufficient documentation

## 2015-10-16 DIAGNOSIS — Z794 Long term (current) use of insulin: Secondary | ICD-10-CM | POA: Insufficient documentation

## 2015-10-16 DIAGNOSIS — Z7984 Long term (current) use of oral hypoglycemic drugs: Secondary | ICD-10-CM | POA: Diagnosis not present

## 2015-10-16 DIAGNOSIS — E785 Hyperlipidemia, unspecified: Secondary | ICD-10-CM | POA: Diagnosis not present

## 2015-10-16 DIAGNOSIS — M25551 Pain in right hip: Secondary | ICD-10-CM | POA: Insufficient documentation

## 2015-10-16 DIAGNOSIS — Y999 Unspecified external cause status: Secondary | ICD-10-CM | POA: Diagnosis not present

## 2015-10-16 DIAGNOSIS — M5441 Lumbago with sciatica, right side: Secondary | ICD-10-CM | POA: Insufficient documentation

## 2015-10-16 DIAGNOSIS — E1165 Type 2 diabetes mellitus with hyperglycemia: Secondary | ICD-10-CM | POA: Diagnosis not present

## 2015-10-17 ENCOUNTER — Encounter (HOSPITAL_COMMUNITY): Payer: Self-pay | Admitting: Emergency Medicine

## 2015-10-17 ENCOUNTER — Emergency Department (HOSPITAL_COMMUNITY)
Admission: EM | Admit: 2015-10-17 | Discharge: 2015-10-17 | Disposition: A | Discharge status: Home / Self Care | Payer: Medicaid Other | Attending: Emergency Medicine | Admitting: Emergency Medicine

## 2015-10-17 ENCOUNTER — Emergency Department (HOSPITAL_COMMUNITY): Payer: Medicaid Other

## 2015-10-17 DIAGNOSIS — G8929 Other chronic pain: Secondary | ICD-10-CM

## 2015-10-17 DIAGNOSIS — M25511 Pain in right shoulder: Secondary | ICD-10-CM

## 2015-10-17 DIAGNOSIS — B372 Candidiasis of skin and nail: Secondary | ICD-10-CM

## 2015-10-17 DIAGNOSIS — L02415 Cutaneous abscess of right lower limb: Secondary | ICD-10-CM

## 2015-10-17 DIAGNOSIS — M25551 Pain in right hip: Secondary | ICD-10-CM

## 2015-10-17 DIAGNOSIS — M5441 Lumbago with sciatica, right side: Secondary | ICD-10-CM

## 2015-10-17 DIAGNOSIS — R739 Hyperglycemia, unspecified: Secondary | ICD-10-CM

## 2015-10-17 DIAGNOSIS — W19XXXA Unspecified fall, initial encounter: Secondary | ICD-10-CM

## 2015-10-17 LAB — COMPREHENSIVE METABOLIC PANEL
ALBUMIN: 3.3 g/dL — AB (ref 3.5–5.0)
ALT: 13 U/L — ABNORMAL LOW (ref 14–54)
ANION GAP: 7 (ref 5–15)
AST: 15 U/L (ref 15–41)
Alkaline Phosphatase: 116 U/L (ref 38–126)
BILIRUBIN TOTAL: 0.4 mg/dL (ref 0.3–1.2)
BUN: 16 mg/dL (ref 6–20)
CO2: 27 mmol/L (ref 22–32)
Calcium: 9.3 mg/dL (ref 8.9–10.3)
Chloride: 99 mmol/L — ABNORMAL LOW (ref 101–111)
Creatinine, Ser: 0.82 mg/dL (ref 0.44–1.00)
GFR calc Af Amer: >60 mL/min (ref >60)
GFR calc non Af Amer: >60 mL/min (ref >60)
GLUCOSE: 442 mg/dL — AB (ref 65–99)
POTASSIUM: 4.2 mmol/L (ref 3.5–5.1)
SODIUM: 133 mmol/L — AB (ref 135–145)
TOTAL PROTEIN: 7.1 g/dL (ref 6.5–8.1)

## 2015-10-17 LAB — CBC WITH DIFFERENTIAL/PLATELET
Basophils Absolute: 0 10*3/uL (ref 0.0–0.1)
Basophils Relative: 0 %
EOS ABS: 0.3 10*3/uL (ref 0.0–0.7)
Eosinophils Relative: 5 %
HCT: 37.6 % (ref 36.0–46.0)
HEMOGLOBIN: 11.6 g/dL — AB (ref 12.0–15.0)
LYMPHS ABS: 2.2 10*3/uL (ref 0.7–4.0)
LYMPHS PCT: 30 %
MCH: 25.3 pg — AB (ref 26.0–34.0)
MCHC: 30.9 g/dL (ref 30.0–36.0)
MCV: 81.9 fL (ref 78.0–100.0)
Monocytes Absolute: 0.3 10*3/uL (ref 0.1–1.0)
Monocytes Relative: 4 %
NEUTROS PCT: 61 %
Neutro Abs: 4.4 10*3/uL (ref 1.7–7.7)
PLATELETS: 188 10*3/uL (ref 150–400)
RBC: 4.59 MIL/uL (ref 3.87–5.11)
RDW: 15 % (ref 11.5–15.5)
WBC: 7.2 10*3/uL (ref 4.0–10.5)

## 2015-10-17 LAB — URINE MICROSCOPIC-ADD ON

## 2015-10-17 LAB — URINALYSIS, ROUTINE W REFLEX MICROSCOPIC
Bilirubin Urine: NEGATIVE
Ketones, ur: NEGATIVE mg/dL
Nitrite: NEGATIVE
PH: 5.5 (ref 5.0–8.0)
Protein, ur: NEGATIVE mg/dL
SPECIFIC GRAVITY, URINE: 1.038 — AB (ref 1.005–1.030)

## 2015-10-17 LAB — CBG MONITORING, ED: GLUCOSE-CAPILLARY: 397 mg/dL — AB (ref 65–99)

## 2015-10-17 LAB — POC URINE PREG, ED: Preg Test, Ur: NEGATIVE

## 2015-10-17 MED ORDER — HYDROCODONE-ACETAMINOPHEN 5-325 MG PO TABS
1.0000 | ORAL_TABLET | Freq: Once | ORAL | Status: AC
  Administered 2015-10-17: 1 via ORAL
  Filled 2015-10-17: qty 1

## 2015-10-17 MED ORDER — SULFAMETHOXAZOLE-TRIMETHOPRIM 800-160 MG PO TABS: 1.0000 | ORAL_TABLET | Freq: Two times a day (BID) | ORAL | Status: AC

## 2015-10-17 MED ORDER — LIDOCAINE HCL (PF) 1 % IJ SOLN
5.0000 mL | Freq: Once | INTRAMUSCULAR | Status: AC
Start: 2015-10-17 — End: 2015-10-17
  Administered 2015-10-17: 5 mL

## 2015-10-17 MED ORDER — CLOTRIMAZOLE-BETAMETHASONE 1-0.05 % EX CREA: 1.0000 "application " | TOPICAL_CREAM | Freq: Two times a day (BID) | CUTANEOUS | Status: DC

## 2015-10-17 MED ORDER — HYDROCODONE-ACETAMINOPHEN 5-325 MG PO TABS: 1.0000 | ORAL_TABLET | ORAL | Status: DC | PRN

## 2015-10-17 NOTE — Discharge Instructions (Signed)
Abscess An abscess is an infected area that contains a collection of pus and debris.It can occur in almost any part of the body. An abscess is also known as a furuncle or boil. CAUSES  An abscess occurs when tissue gets infected. This can occur from blockage of oil or sweat glands, infection of hair follicles, or a minor injury to the skin. As the body tries to fight the infection, pus collects in the area and creates pressure under the skin. This pressure causes pain. People with weakened immune systems have difficulty fighting infections and get certain abscesses more often.  SYMPTOMS Usually an abscess develops on the skin and becomes a painful mass that is red, warm, and tender. If the abscess forms under the skin, you may feel a moveable soft area under the skin. Some abscesses break open (rupture) on their own, but most will continue to get worse without care. The infection can spread deeper into the body and eventually into the bloodstream, causing you to feel ill.  DIAGNOSIS  Your caregiver will take your medical history and perform a physical exam. A sample of fluid may also be taken from the abscess to determine what is causing your infection. TREATMENT  Your caregiver may prescribe antibiotic medicines to fight the infection. However, taking antibiotics alone usually does not cure an abscess. Your caregiver may need to make a small cut (incision) in the abscess to drain the pus. In some cases, gauze is packed into the abscess to reduce pain and to continue draining the area. HOME CARE INSTRUCTIONS   Only take over-the-counter or prescription medicines for pain, discomfort, or fever as directed by your caregiver.  If you were prescribed antibiotics, take them as directed. Finish them even if you start to feel better.  If gauze is used, follow your caregiver's directions for changing the gauze.  To avoid spreading the infection:  Keep your draining abscess covered with a  bandage.  Wash your hands well.  Do not share personal care items, towels, or whirlpools with others.  Avoid skin contact with others.  Keep your skin and clothes clean around the abscess.  Keep all follow-up appointments as directed by your caregiver. SEEK MEDICAL CARE IF:   You have increased pain, swelling, redness, fluid drainage, or bleeding.  You have muscle aches, chills, or a general ill feeling.  You have a fever. MAKE SURE YOU:   Understand these instructions.  Will watch your condition.  Will get help right away if you are not doing well or get worse.   This information is not intended to replace advice given to you by your health care provider. Make sure you discuss any questions you have with your health care provider.   Document Released: 01/16/2005 Document Revised: 10/08/2011 Document Reviewed: 06/21/2011 Elsevier Interactive Patient Education 2016 [place]. Hyperglycemia Hyperglycemia occurs when the glucose (sugar) in your blood is too high. Hyperglycemia can happen for many reasons, but it most often happens to people who do not know they have diabetes or are not managing their diabetes properly.  CAUSES  Whether you have diabetes or not, there are other causes of hyperglycemia. Hyperglycemia can occur when you have diabetes, but it can also occur in other situations that you might not be as aware of, such as: Diabetes  If you have diabetes and are having problems controlling your blood glucose, hyperglycemia could occur because of some of the following reasons:  Not following your meal plan.  Not taking your diabetes  medications or not taking it properly.  Exercising less or doing less activity than you normally do.  Being sick. Pre-diabetes  This cannot be ignored. Before people develop Type 2 diabetes, they almost always have "pre-diabetes." This is when your blood glucose levels are higher than normal, but not yet high enough to be diagnosed  as diabetes. Research has shown that some long-term damage to the body, especially the heart and circulatory system, may already be occurring during pre-diabetes. If you take action to manage your blood glucose when you have pre-diabetes, you may delay or prevent Type 2 diabetes from developing. Stress  If you have diabetes, you may be "diet" controlled or on oral medications or insulin to control your diabetes. However, you may find that your blood glucose is higher than usual in the hospital whether you have diabetes or not. This is often referred to as "stress hyperglycemia." Stress can elevate your blood glucose. This happens because of hormones put out by the body during times of stress. If stress has been the cause of your high blood glucose, it can be followed regularly by your caregiver. That way he/she can make sure your hyperglycemia does not continue to get worse or progress to diabetes. Steroids  Steroids are medications that act on the infection fighting system (immune system) to block inflammation or infection. One side effect can be a rise in blood glucose. Most people can produce enough extra insulin to allow for this rise, but for those who cannot, steroids make blood glucose levels go even higher. It is not unusual for steroid treatments to "uncover" diabetes that is developing. It is not always possible to determine if the hyperglycemia will go away after the steroids are stopped. A special blood test called an A1c is sometimes done to determine if your blood glucose was elevated before the steroids were started. SYMPTOMS  Thirsty.  Frequent urination.  Dry mouth.  Blurred vision.  Tired or fatigue.  Weakness.  Sleepy.  Tingling in feet or leg. DIAGNOSIS  Diagnosis is made by monitoring blood glucose in one or all of the following ways:  A1c test. This is a chemical found in your blood.  Fingerstick blood glucose monitoring.  Laboratory results. TREATMENT  First,  knowing the cause of the hyperglycemia is important before the hyperglycemia can be treated. Treatment may include, but is not be limited to:  Education.  Change or adjustment in medications.  Change or adjustment in meal plan.  Treatment for an illness, infection, etc.  More frequent blood glucose monitoring.  Change in exercise plan.  Decreasing or stopping steroids.  Lifestyle changes. HOME CARE INSTRUCTIONS   Test your blood glucose as directed.  Exercise regularly. Your caregiver will give you instructions about exercise. Pre-diabetes or diabetes which comes on with stress is helped by exercising.  Eat wholesome, balanced meals. Eat often and at regular, fixed times. Your caregiver or nutritionist will give you a meal plan to guide your sugar intake.  Being at an ideal weight is important. If needed, losing as little as 10 to 15 pounds may help improve blood glucose levels. SEEK MEDICAL CARE IF:   You have questions about medicine, activity, or diet.  You continue to have symptoms (problems such as increased thirst, urination, or weight gain). SEEK IMMEDIATE MEDICAL CARE IF:   You are vomiting or have diarrhea.  Your breath smells fruity.  You are breathing faster or slower.  You are very sleepy or incoherent.  You have numbness, tingling,  or pain in your feet or hands.  You have chest pain.  Your symptoms get worse even though you have been following your caregiver's orders.  If you have any other questions or concerns.   This information is not intended to replace advice given to you by your health care provider. Make sure you discuss any questions you have with your health care provider.   Document Released: 10/02/2000 Document Revised: 07/01/2011 Document Reviewed: 12/13/2014 Elsevier Interactive Patient Education 2016 [place]. Cryotherapy Cryotherapy means treatment with cold. Ice or gel packs can be used to reduce both pain and swelling. Ice is  the most helpful within the first 24 to 48 hours after an injury or flare-up from overusing a muscle or joint. Sprains, strains, spasms, burning pain, shooting pain, and aches can all be eased with ice. Ice can also be used when recovering from surgery. Ice is effective, has very few side effects, and is safe for most people to use. PRECAUTIONS  Ice is not a safe treatment option for people with:  Raynaud phenomenon. This is a condition affecting small blood vessels in the extremities. Exposure to cold may cause your problems to return.  Cold hypersensitivity. There are many forms of cold hypersensitivity, including:  Cold urticaria. Red, itchy hives appear on the skin when the tissues begin to warm after being iced.  Cold erythema. This is a red, itchy rash caused by exposure to cold.  Cold hemoglobinuria. Red blood cells break down when the tissues begin to warm after being iced. The hemoglobin that carry oxygen are passed into the urine because they cannot combine with blood proteins fast enough.  Numbness or altered sensitivity in the area being iced. If you have any of the following conditions, do not use ice until you have discussed cryotherapy with your caregiver:  Heart conditions, such as arrhythmia, angina, or chronic heart disease.  High blood pressure.  Healing wounds or open skin in the area being iced.  Current infections.  Rheumatoid arthritis.  Poor circulation.  Diabetes. Ice slows the blood flow in the region it is applied. This is beneficial when trying to stop inflamed tissues from spreading irritating chemicals to surrounding tissues. However, if you expose your skin to cold temperatures for too long or without the proper protection, you can damage your skin or nerves. Watch for signs of skin damage due to cold. HOME CARE INSTRUCTIONS Follow these tips to use ice and cold packs safely.  Place a dry or damp towel between the ice and skin. A damp towel will cool  the skin more quickly, so you may need to shorten the time that the ice is used.  For a more rapid response, add gentle compression to the ice.  Ice for no more than 10 to 20 minutes at a time. The bonier the area you are icing, the less time it will take to get the benefits of ice.  Check your skin after 5 minutes to make sure there are no signs of a poor response to cold or skin damage.  Rest 20 minutes or more between uses.  Once your skin is numb, you can end your treatment. You can test numbness by very lightly touching your skin. The touch should be so light that you do not see the skin dimple from the pressure of your fingertip. When using ice, most people will feel these normal sensations in this order: cold, burning, aching, and numbness.  Do not use ice on someone who cannot  communicate their responses to pain, such as small children or people with dementia. HOW TO MAKE AN ICE PACK Ice packs are the most common way to use ice therapy. Other methods include ice massage, ice baths, and cryosprays. Muscle creams that cause a cold, tingly feeling do not offer the same benefits that ice offers and should not be used as a substitute unless recommended by your caregiver. To make an ice pack, do one of the following:  Place crushed ice or a bag of frozen vegetables in a sealable plastic bag. Squeeze out the excess air. Place this bag inside another plastic bag. Slide the bag into a pillowcase or place a damp towel between your skin and the bag.  Mix 3 parts water with 1 part rubbing alcohol. Freeze the mixture in a sealable plastic bag. When you remove the mixture from the freezer, it will be slushy. Squeeze out the excess air. Place this bag inside another plastic bag. Slide the bag into a pillowcase or place a damp towel between your skin and the bag. SEEK MEDICAL CARE IF:  You develop white spots on your skin. This may give the skin a blotchy (mottled) appearance.  Your skin turns blue  or pale.  Your skin becomes waxy or hard.  Your swelling gets worse. MAKE SURE YOU:   Understand these instructions.  Will watch your condition.  Will get help right away if you are not doing well or get worse.   This information is not intended to replace advice given to you by your health care provider. Make sure you discuss any questions you have with your health care provider.   Document Released: 12/03/2010 Document Revised: 04/29/2014 Document Reviewed: 12/03/2010 Elsevier Interactive Patient Education [employer].

## 2015-10-17 NOTE — ED Notes (Signed)
Patient transported to MRI 

## 2015-10-17 NOTE — ED Provider Notes (Signed)
CSN: [id number]     Arrival date & time 10/16/15  2328 History   First MD Initiated Contact with Patient 10/17/15 0448     Chief Complaint  Patient presents with  . Fall     (Consider location/radiation/quality/duration/timing/severity/associated sxs/prior Treatment) HPI Comments: Patient with a history of DM, HLD, left blindness present with complaint of pain since fall occurring yesterday. She slipped while walking down a flight of concrete stairs. No head injury. She complains of increased chronic pain in her right shoulder, pain in right wrist, low back and right hip. She has been ambulatory. No abdominal/check/neck pain or injury.   She also complains of a rash in the groin area with a central area of enlargement and pain. No drainage.   Patient is a 45 y.o. female presenting with fall. The history is provided by the patient. No language interpreter was used.  Fall This is a new problem. The current episode started yesterday. The problem occurs constantly. Associated symptoms include a rash. Pertinent negatives include no abdominal pain, chest pain, chills, fever, nausea or neck pain.    Past Medical History  Diagnosis Date  . Routine/ritual circumcision   . Anemia, iron deficiency   . Glaucoma associated with ocular inflammation   . Uveitis   . Decreased visual acuity     Left eye  . Hair loss   . Hyperlipidemia   . Pap smear abnormality of cervix with LGSIL   . Type II diabetes mellitus ([place])   . Diabetes mellitus without complication ([place])   . Hypercholesteremia   . Blindness of left eye    Past Surgical History  Procedure Laterality Date  . Cesarean section     No family history on file. Social History  Substance Use Topics  . Smoking status: Never Smoker   . Smokeless tobacco: None  . Alcohol Use: No   OB History    Gravida Para Term Preterm AB TAB SAB Ectopic Multiple Living   7 4 4  3  3   4      Review of Systems  Constitutional: Negative for fever and  chills.  Respiratory: Negative.  Negative for shortness of breath.   Cardiovascular: Negative.  Negative for chest pain.  Gastrointestinal: Negative.  Negative for nausea and abdominal pain.  Musculoskeletal: Positive for back pain (c/o low back pain). Negative for neck pain.       C/O right hip pain  Skin: Positive for rash. Negative for wound.  Neurological: Negative.       Allergies  Review of patient's allergies indicates no known allergies.  Home Medications   Prior to Admission medications   Medication Sig Start Date End Date Taking? Authorizing Provider  ACCU-CHEK SMARTVIEW test strip CHECK BLOOD SUGAR 4 TIMES DAILY BEFORE MEALS AND AT BEDTIME 10/10/15   Juluis Mire, MD  acetaminophen (TYLENOL) 500 MG tablet Take 1,000 mg by mouth every 6 (six) hours as needed.    Historical Provider, MD  atorvastatin (LIPITOR) 40 MG tablet Take 1 tablet (40 mg total) by mouth daily. 08/25/15   Juluis Mire, MD  atropine 1 % ophthalmic solution Place 1 drop into both eyes daily.     Historical Provider, MD  brimonidine (ALPHAGAN P) 0.1 % SOLN Place 1 drop into the left eye 2 (two) times daily.     Historical Provider, MD  dorzolamide-timolol (COSOPT) 22.3-6.8 MG/ML ophthalmic solution Place 1 drop into the left eye 2 (two) times daily.    Historical Provider, MD  ergocalciferol (  VITAMIN D2) 50000 units capsule Take 1 capsule (50,000 Units total) by mouth once a week. 10/09/15   Lucious Groves, DO  ferrous sulfate 325 (65 FE) MG tablet Take 1 tablet (325 mg total) by mouth daily. 10/09/15 10/08/16  Lucious Groves, DO  fluconazole (DIFLUCAN) 150 MG tablet Take 1 tablet (150 mg total) by mouth once. Take for vaginal itching 09/22/15   Juluis Mire, MD  gabapentin (NEURONTIN) 300 MG capsule Take 1 capsule (300 mg total) by mouth 3 (three) times daily. Patient taking differently: Take 300 mg by mouth 2 (two) times daily.  04/11/15 04/10/16  Norman Herrlich, MD  Insulin NPH, Human,, Isophane, (HUMULIN N  KWIKPEN) 100 UNIT/ML Kiwkpen INJECT  40 UNITS SUBCUTANEOUSLY EVERY MORNING AND 40 UNITS EVERY EVENING 10/04/15   Juluis Mire, MD  Insulin Pen Needle 31G X 5 MM MISC 1 Dose by Does not apply route 2 (two) times daily. 08/25/15   Juluis Mire, MD  Lancets (ACCU-CHEK SOFT TOUCH) lancets Check Blood Sugar 4 times daily before meals and at bedtime. Dx code: [id number] 09/23/14   Milagros Loll, MD  metFORMIN (GLUCOPHAGE) 1000 MG tablet Take 1 tablet (1,000 mg total) by mouth 2 (two) times daily with a meal. 10/06/15   Lucious Groves, DO  prednisoLONE acetate (PRED FORTE) 1 % ophthalmic suspension Place 1 drop into the right eye 2 (two) times daily.     Historical Provider, MD  vitamin C (ASCORBIC ACID) 500 MG tablet Take 500 mg by mouth daily.    Historical Provider, MD   BP 147/89 mmHg  Pulse 71  Temp(Src) 98.9 F (37.2 C) (Oral)  Resp 18  SpO2 98%  LMP 09/15/2015 (Approximate) Physical Exam  Constitutional: She is oriented to person, place, and time. She appears well-developed and well-nourished.  HENT:  Head: Normocephalic.  Neck: Normal range of motion. Neck supple.  Cardiovascular: Normal rate and regular rhythm.   Pulmonary/Chest: Effort normal and breath sounds normal.  Abdominal: Soft. Bowel sounds are normal. There is no tenderness. There is no rebound and no guarding.  Musculoskeletal: Normal range of motion.  Right shoulder: no swelling or bony deformity. FROM. Generally tender to shoulder joint  Right hip: there is tenderness with bony deformity to lateral and posterior hip. Tender over sacral/coccyx area without apparent signs of trauma.  Right wrist: no deformities. FROM with minimal distress. No swelling.  Neurological: She is alert and oriented to person, place, and time.  Skin: Skin is warm and dry. No rash noted.  Erythematous, plaque-like rash to bilateral groin c/w candidal rash. There is a 3 cm x 4 cm area of induration with central area of fluctuance c/w abscess.    Psychiatric: She has a normal mood and affect.    ED Course  Procedures (including critical care time) Labs Review Labs Reviewed  CBC WITH DIFFERENTIAL/PLATELET - Abnormal; Notable for the following:    Hemoglobin 11.6 (*)    MCH 25.3 (*)    All other components within normal limits  COMPREHENSIVE METABOLIC PANEL - Abnormal; Notable for the following:    Sodium 133 (*)    Chloride 99 (*)    Glucose, Bld 442 (*)    Albumin 3.3 (*)    ALT 13 (*)    All other components within normal limits  URINALYSIS, ROUTINE W REFLEX MICROSCOPIC (NOT AT [facility]) - Abnormal; Notable for the following:    APPearance CLOUDY (*)    Specific Gravity, Urine 1.038 (*)  Glucose, UA >1000 (*)    Hgb urine dipstick TRACE (*)    Leukocytes, UA MODERATE (*)    All other components within normal limits  URINE MICROSCOPIC-ADD ON - Abnormal; Notable for the following:    Squamous Epithelial / LPF 6-30 (*)    Bacteria, UA FEW (*)    All other components within normal limits  POC URINE PREG, ED    Imaging Review No results found. I have personally reviewed and evaluated these images and lab results as part of my medical decision-making.   EKG Interpretation None     Dg Shoulder Right  10/17/2015  CLINICAL DATA:  [place] 1 night ago.  RIGHT shoulder pain. EXAM: RIGHT SHOULDER - 2+ VIEW COMPARISON:  None. FINDINGS: There is no evidence of fracture or dislocation. There is no evidence of arthropathy or other focal bone abnormality. Soft tissues are unremarkable. IMPRESSION: Negative. Electronically Signed   By: Elon Alas M.D.   On: 10/17/2015 06:15   Dg Hip Unilat With Pelvis 2-3 Views Right  10/17/2015  CLINICAL DATA:  [place] 1 acne ago.  RIGHT hip pain. EXAM: DG HIP (WITH OR WITHOUT PELVIS) 2-3V RIGHT COMPARISON:  None. FINDINGS: There is no evidence of hip fracture or dislocation. There is no evidence of arthropathy or other focal bone abnormality. Transitional anatomy, partially sacralized LEFT L5  transverse process. IMPRESSION: Negative. Electronically Signed   By: Elon Alas M.D.   On: 10/17/2015 06:16   INCISION AND DRAINAGE Performed by: Charlann Lange A Consent: Verbal consent obtained. Risks and benefits: risks, benefits and alternatives were discussed Type: abscess  Body area: right proximal thigh  Anesthesia: local infiltration  Incision was made with a scalpel.  Local anesthetic: lidocaine 1% w/o epinephrine  Anesthetic total: 2 ml  Complexity: complex Blunt dissection to break up loculations  Drainage: purulent  Drainage amount: moderate, purulent  Packing material: none  Patient tolerance: Patient tolerated the procedure well with no immediate complications.    MDM   Final diagnoses:  None    1. Fall 2. Low back pain 2. Right hip pain 3. Chronic right shoulder pain 4. Hyperglycemia wtihout acidosis 5. Cutaneous abscess right proximal thigh 6. Candidal skin rash  Patient presents after fall yesterday with complaint of low back, right hip and right buttock pain, right shoulder pain increased over chronic and right wrist pain. No head injury, neck pain, abdominal or chest pain.   All imaging for evaluation of fall complaints negative.   Hyperglycemia: no acidosis. The patient reports she did not take her night time insulin last night. She is encouraged to continue regular insulin dosing.   Candidal skin rash - Rx for clotrimazole. Discussed avoiding I&D area with topical cream  Cutaneous abscess - I&D'd without complication.   Patient can be discharged home with PCP follow for recheck of abscess in 2 days.    Charlann Lange, PA-C 10/17/15 [id number]  Jola Schmidt, MD 10/17/15 [phone]

## 2015-10-17 NOTE — ED Notes (Signed)
MD [facility] aware of [facility]-ok to d/c at this time.

## 2015-10-17 NOTE — ED Notes (Signed)
[place] , denies LOC /ambulatory , reports pain at right shoulder , low back pain and right wrist pain, no deformity / respirations unlabored.

## 2015-11-14 ENCOUNTER — Telehealth: Payer: Self-pay | Admitting: Internal Medicine

## 2015-11-14 NOTE — Telephone Encounter (Signed)
Asking for A1C result

## 2015-11-17 ENCOUNTER — Ambulatory Visit (INDEPENDENT_AMBULATORY_CARE_PROVIDER_SITE_OTHER): Payer: Medicaid Other | Admitting: Internal Medicine

## 2015-11-17 VITALS — BP 125/70 | HR 82 | Temp 98.2°F | Wt 198.7 lb

## 2015-11-17 DIAGNOSIS — Z794 Long term (current) use of insulin: Secondary | ICD-10-CM | POA: Diagnosis not present

## 2015-11-17 DIAGNOSIS — E113212 Type 2 diabetes mellitus with mild nonproliferative diabetic retinopathy with macular edema, left eye: Secondary | ICD-10-CM

## 2015-11-17 DIAGNOSIS — E113219 Type 2 diabetes mellitus with mild nonproliferative diabetic retinopathy with macular edema, unspecified eye: Secondary | ICD-10-CM

## 2015-11-17 LAB — GLUCOSE, CAPILLARY: GLUCOSE-CAPILLARY: 194 mg/dL — AB (ref 65–99)

## 2015-11-17 LAB — POCT GLYCOSYLATED HEMOGLOBIN (HGB A1C): Hemoglobin A1C: 13.2

## 2015-11-17 MED ORDER — INSULIN ISOPHANE HUMAN 100 UNIT/ML KWIKPEN: PEN_INJECTOR | SUBCUTANEOUS | 2 refills | Status: DC

## 2015-11-17 NOTE — Progress Notes (Signed)
   CC: here for DM follow up  HPI:  [name] is a 45 y.o. woman with PMHx as below presenting for DM follow up.  Please see A&P for status of medical conditions addressed today.  Past Medical History:  Diagnosis Date  . Anemia, iron deficiency   . Blindness of left eye   . Decreased visual acuity    Left eye  . Diabetes mellitus without complication ([place])   . Glaucoma associated with ocular inflammation   . Hair loss   . Hypercholesteremia   . Hyperlipidemia   . Pap smear abnormality of cervix with LGSIL   . Routine/ritual circumcision   . Type II diabetes mellitus ([place])   . Uveitis     Review of Systems:   Review of Systems  Constitutional: Negative for chills and fever.  Eyes: Positive for blurred vision.  Respiratory: Negative for shortness of breath.   Cardiovascular: Negative for chest pain.     Physical Exam:  Vitals:   11/17/15 1510  BP: 125/70  Pulse: 82  Temp: 98.2 F (36.8 C)  TempSrc: Oral  SpO2: 100%  Weight: 198 lb 11.2 oz (90.1 kg)   Physical Exam  Constitutional: She is oriented to person, place, and time and well-developed, well-nourished, and in no distress.  Pleasant, soft spoken, accompanied by son  HENT:  Wearing sunglasses  Pulmonary/Chest: Effort normal.  Neurological: She is alert and oriented to person, place, and time.  Psychiatric: Mood and affect normal.    Assessment & Plan:   See Encounters Tab for problem based charting.   Patient discussed with Dr. 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  She reportedly was told by her opthomologist at [facility] that her A1c needed to be less than 13 for her to have surgery which is why she is here today.  She brought her meter with her and the average readings are 231, checked mostly in the morning.  She has not been  experiencing any symptomatic hypoglycemia.  She has been taking 50 units in the AM and 40 units in the PM of her Novolin instead of 40 units BID.  She occasionally does not take her metformin but is not very clear as to why.  P: - I have increased her insulin about 10% to 54 units in the AM and 44 in the PM based on what she has been taking and based on her poor glycemic control - I reinforced taking metformin every day BID as prescribed.  She has not been having any side effects from this medication. - she will follow up in 4 weeks with her meter to see how she is doing.

## 2015-11-17 NOTE — Patient Instructions (Signed)
Thank you for coming to see me today. It was a pleasure. Today we talked about:   Diabetes: - Please take Metformin as prescribed. -- I have increased your insulin.  Take 54 units in the AM and 44 units in the PM. - check your blood sugar twice per day.  Please follow-up with [place] in 4 weeks.  If you have any questions or concerns, please do not hesitate to call the office at [phone].  Take Care,   Jule Ser, DO

## 2015-11-17 NOTE — Assessment & Plan Note (Signed)
Lab Results  Component Value Date   HGBA1C 13.2 11/17/2015   A: Her A1c continues to be unimproved.  She reportedly was told by her opthomologist at [facility] that her A1c needed to be less than 13 for her to have surgery which is why she is here today.  She brought her meter with her and the average readings are 231, checked mostly in the morning.  She has not been experiencing any symptomatic hypoglycemia.  She has been taking 50 units in the AM and 40 units in the PM of her Novolin instead of 40 units BID.  She occasionally does not take her metformin but is not very clear as to why.  P: - I have increased her insulin about 10% to 54 units in the AM and 44 in the PM based on what she has been taking and based on her poor glycemic control - I reinforced taking metformin every day BID as prescribed.  She has not been having any side effects from this medication. - she will follow up in 4 weeks with her meter to see how she is doing.

## 2015-11-20 NOTE — Progress Notes (Signed)
Medicine attending: Medical history, presenting problems, physical findings, and medications, reviewed with resident physician Dr Andrew Wallace on the day of the patient visit and I concur with his evaluation and management plan. 

## 2015-11-24 ENCOUNTER — Encounter (HOSPITAL_BASED_OUTPATIENT_CLINIC_OR_DEPARTMENT_OTHER): Payer: Self-pay

## 2015-12-31 ENCOUNTER — Emergency Department (HOSPITAL_COMMUNITY)
Admission: EM | Admit: 2015-12-31 | Discharge: 2015-12-31 | Disposition: A | Discharge status: Home / Self Care | Payer: Medicaid Other | Attending: Emergency Medicine | Admitting: Emergency Medicine

## 2015-12-31 ENCOUNTER — Encounter (HOSPITAL_COMMUNITY): Payer: Self-pay | Admitting: Emergency Medicine

## 2015-12-31 DIAGNOSIS — E1139 Type 2 diabetes mellitus with other diabetic ophthalmic complication: Secondary | ICD-10-CM | POA: Insufficient documentation

## 2015-12-31 DIAGNOSIS — H409 Unspecified glaucoma: Secondary | ICD-10-CM | POA: Diagnosis not present

## 2015-12-31 DIAGNOSIS — Z794 Long term (current) use of insulin: Secondary | ICD-10-CM | POA: Diagnosis not present

## 2015-12-31 DIAGNOSIS — Z7984 Long term (current) use of oral hypoglycemic drugs: Secondary | ICD-10-CM | POA: Diagnosis not present

## 2015-12-31 DIAGNOSIS — R0981 Nasal congestion: Secondary | ICD-10-CM | POA: Insufficient documentation

## 2015-12-31 DIAGNOSIS — R519 Headache, unspecified: Secondary | ICD-10-CM

## 2015-12-31 DIAGNOSIS — E1140 Type 2 diabetes mellitus with diabetic neuropathy, unspecified: Secondary | ICD-10-CM | POA: Insufficient documentation

## 2015-12-31 DIAGNOSIS — R51 Headache: Secondary | ICD-10-CM | POA: Diagnosis present

## 2015-12-31 DIAGNOSIS — H53142 Visual discomfort, left eye: Secondary | ICD-10-CM | POA: Insufficient documentation

## 2015-12-31 LAB — I-STAT CHEM 8, ED
BUN: 10 mg/dL (ref 6–20)
CALCIUM ION: 1.17 mmol/L (ref 1.15–1.40)
CHLORIDE: 101 mmol/L (ref 101–111)
CREATININE: 0.5 mg/dL (ref 0.44–1.00)
GLUCOSE: 261 mg/dL — AB (ref 65–99)
HEMATOCRIT: 40 % (ref 36.0–46.0)
Hemoglobin: 13.6 g/dL (ref 12.0–15.0)
POTASSIUM: 3.8 mmol/L (ref 3.5–5.1)
SODIUM: 137 mmol/L (ref 135–145)
TCO2: 25 mmol/L (ref 0–100)

## 2015-12-31 MED ORDER — KETOROLAC TROMETHAMINE 30 MG/ML IJ SOLN
15.0000 mg | Freq: Once | INTRAMUSCULAR | Status: AC
  Administered 2015-12-31: 15 mg via INTRAVENOUS
  Filled 2015-12-31: qty 1

## 2015-12-31 MED ORDER — METOCLOPRAMIDE HCL 5 MG/ML IJ SOLN
10.0000 mg | Freq: Once | INTRAMUSCULAR | Status: AC
Start: 2015-12-31 — End: 2015-12-31
  Administered 2015-12-31: 10 mg via INTRAVENOUS
  Filled 2015-12-31: qty 2

## 2015-12-31 MED ORDER — SODIUM CHLORIDE 0.9 % IV BOLUS (SEPSIS)
1000.0000 mL | Freq: Once | INTRAVENOUS | Status: AC
  Administered 2015-12-31: 1000 mL via INTRAVENOUS

## 2015-12-31 MED ORDER — FLUORESCEIN SODIUM 1 MG OP STRP
1.0000 | ORAL_STRIP | Freq: Once | OPHTHALMIC | Status: AC
  Administered 2015-12-31: 1 via OPHTHALMIC
  Filled 2015-12-31: qty 1

## 2015-12-31 MED ORDER — PROPARACAINE HCL 0.5 % OP SOLN
1.0000 [drp] | Freq: Once | OPHTHALMIC | Status: AC
Start: 2015-12-31 — End: 2015-12-31
  Administered 2015-12-31: 1 [drp] via OPHTHALMIC
  Filled 2015-12-31: qty 15

## 2015-12-31 MED ORDER — DIPHENHYDRAMINE HCL 50 MG/ML IJ SOLN
25.0000 mg | Freq: Once | INTRAMUSCULAR | Status: AC
  Administered 2015-12-31: 25 mg via INTRAVENOUS
  Filled 2015-12-31: qty 1

## 2015-12-31 MED ORDER — CETIRIZINE HCL 10 MG PO TABS: 10.0000 mg | ORAL_TABLET | Freq: Every day | ORAL | 1 refills | Status: DC

## 2015-12-31 MED ORDER — ACETAMINOPHEN 325 MG PO TABS: 650.0000 mg | ORAL_TABLET | Freq: Four times a day (QID) | ORAL | 0 refills | Status: DC | PRN

## 2015-12-31 NOTE — ED Notes (Signed)
Papers reviewed with family and pt. The family verbalizes understanding and pt. Leaving with them

## 2015-12-31 NOTE — ED Notes (Signed)
Will Dansie, PA at bedside at this time. 

## 2015-12-31 NOTE — ED Triage Notes (Signed)
Patient presents to the ED with c/o migraine headache - primarily R side of head/face x 2 days. Denies nausea/vomiting.Reports headache has become progressively worse.

## 2015-12-31 NOTE — ED Provider Notes (Signed)
[facility] DEPT Provider Note   CSN: [id number] Arrival date & time: 12/31/15  [id number]     History   Chief Complaint Chief Complaint  Patient presents with  . Migraine    HPI Kellyann Kasparian is a 45 y.o. female.  Kelise Kopera is a 45 y.o. Female with a history of DM, glaucoma of her left eye, and uveitis who presents to the ED with her daughter complaining of a gradually worsening headache for two days. She reports a right sided headache and she has pain on the right side of her face and back to the back of her head. She also reports nasal congestion and sinus pain and pressure. She reports pain behind her left eye. She reports photophobia. She has a history of glaucoma to her left eye. She reports this does not feel like her glaucoma or uveitis. She denies head injury or trauma. Patient denies fevers, neck stiffness, double vision, sore throat, ear pain, chest pain, shortness of breath, abdominal pain, nausea, vomiting, numbness, tingling or weakness.   The history is provided by the patient and a relative. The history is limited by a language barrier. A language interpreter was used.  Migraine  Associated symptoms include headaches. Pertinent negatives include no chest pain, no abdominal pain and no shortness of breath.    Past Medical History:  Diagnosis Date  . Anemia, iron deficiency   . Blindness of left eye   . Decreased visual acuity    Left eye  . Diabetes mellitus without complication ([place])   . Glaucoma associated with ocular inflammation   . Hair loss   . Hypercholesteremia   . Hyperlipidemia   . Pap smear abnormality of cervix with LGSIL   . Routine/ritual circumcision   . Type II diabetes mellitus ([place])   . Uveitis     Patient Active Problem List   Diagnosis Date Noted  . Vaginal itching 09/23/2015  . 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 History of fracture of clavicle 05/18/2015  . Vitamin D deficiency 10/30/2014  . Diabetic  neuropathy ([place]) 05/13/2013  . Iron deficiency anemia 05/13/2013  . Healthcare maintenance 02/26/2011  . 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  . Hyperlipidemia 05/05/2006  . 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 CESAREAN SECTION      OB History    Gravida Para Term Preterm AB Living   7 4 4   3 4    SAB TAB Ectopic Multiple Live Births   3               Home Medications    Prior to Admission medications   Medication Sig Start Date End Date Taking? Authorizing Provider  ACCU-CHEK SMARTVIEW test strip CHECK BLOOD SUGAR 4 TIMES DAILY BEFORE MEALS AND AT BEDTIME 10/10/15   Juluis Mire, MD  acetaminophen (TYLENOL) 325 MG tablet Take 2 tablets (650 mg total) by mouth every 6 (six) hours as needed for mild pain, moderate pain or headache. 12/31/15   Waynetta Pean, PA-C  atorvastatin (LIPITOR) 40 MG tablet Take 1 tablet (40 mg total) by mouth daily. 08/25/15   Juluis Mire, MD  atropine 1 % ophthalmic solution Place 1 drop into both eyes daily.     Historical Provider, MD  brimonidine (ALPHAGAN P) 0.1 % SOLN Place 1 drop into the left eye 2 (two) times daily.     Historical Provider,  MD  cetirizine (ZYRTEC ALLERGY) 10 MG tablet Take 1 tablet (10 mg total) by mouth daily. 12/31/15   Waynetta Pean, PA-C  clotrimazole-betamethasone (LOTRISONE) cream Apply 1 application topically 2 (two) times daily. 10/17/15   Charlann Lange, PA-C  dorzolamide-timolol (COSOPT) 22.3-6.8 MG/ML ophthalmic solution Place 1 drop into the left eye 2 (two) times daily.    Historical Provider, MD  ergocalciferol (VITAMIN D2) 50000 units capsule Take 1 capsule (50,000 Units total) by mouth once a week. 10/09/15   Lucious Groves, DO  ferrous sulfate 325 (65 FE) MG tablet Take 1 tablet (325 mg total) by mouth daily. 10/09/15 10/08/16  Lucious Groves, DO  fluconazole (DIFLUCAN) 150 MG  tablet Take 1 tablet (150 mg total) by mouth once. Take for vaginal itching 09/22/15   Juluis Mire, MD  gabapentin (NEURONTIN) 300 MG capsule Take 1 capsule (300 mg total) by mouth 3 (three) times daily. Patient taking differently: Take 300 mg by mouth 2 (two) times daily.  04/11/15 04/10/16  Norman Herrlich, MD  HYDROcodone-acetaminophen (NORCO/VICODIN) 5-325 MG tablet Take 1 tablet by mouth every 4 (four) hours as needed. 10/17/15   Charlann Lange, PA-C  Insulin NPH, Human,, Isophane, (HUMULIN N KWIKPEN) 100 UNIT/ML Kiwkpen INJECT  54 UNITS SUBCUTANEOUSLY EVERY MORNING AND 44 UNITS EVERY EVENING 11/17/15   Jule Ser, DO  Insulin Pen Needle 31G X 5 MM MISC 1 Dose by Does not apply route 2 (two) times daily. 08/25/15   Juluis Mire, MD  Lancets (ACCU-CHEK SOFT TOUCH) lancets Check Blood Sugar 4 times daily before meals and at bedtime. Dx code: [id number] 09/23/14   Milagros Loll, MD  metFORMIN (GLUCOPHAGE) 1000 MG tablet Take 1 tablet (1,000 mg total) by mouth 2 (two) times daily with a meal. 10/06/15   Lucious Groves, DO  prednisoLONE acetate (PRED FORTE) 1 % ophthalmic suspension Place 1 drop into the right eye 2 (two) times daily.     Historical Provider, MD  vitamin C (ASCORBIC ACID) 500 MG tablet Take 500 mg by mouth daily.    Historical Provider, MD    Family History No family history on file.  Social History Social History  Substance Use Topics  . Smoking status: Never Smoker  . Smokeless tobacco: Never Used  . Alcohol use No     Allergies   Review of patient's allergies indicates no known allergies.   Review of Systems Review of Systems  Constitutional: Negative for chills and fever.  HENT: Positive for congestion and sinus pressure. Negative for ear pain, sore throat and trouble swallowing.   Eyes: Positive for photophobia and pain.  Respiratory: Negative for cough and shortness of breath.   Cardiovascular: Negative for chest pain.  Gastrointestinal: Negative for abdominal  pain, nausea and vomiting.  Genitourinary: Negative for dysuria.  Musculoskeletal: Negative for back pain and neck stiffness.  Skin: Negative for rash.  Neurological: Positive for headaches. Negative for dizziness, weakness, light-headedness and numbness.     Physical Exam Updated Vital Signs BP 132/74   Pulse 91   Temp 98.3 F (36.8 C) (Oral)   Resp 16   Ht 5\' 4"  (1.626 m)   Wt 84.4 kg   LMP 10/30/2015 (Approximate)   SpO2 98%   BMI 31.93 kg/m   Physical Exam  Constitutional: She is oriented to person, place, and time. She appears well-developed and well-nourished. No distress.  Nontoxic appearing.  HENT:  Head: Normocephalic and atraumatic.  Right Ear: External ear normal.  Left Ear: External  ear normal.  Mouth/Throat: Oropharynx is clear and moist. No oropharyngeal exudate.  Mild middle ear effusion noted bilaterally. No TM erythema or loss of landmarks. No visible signs of head trauma. Throat is clear.  Eyes: Conjunctivae and EOM are normal. Pupils are equal, round, and reactive to light. Right eye exhibits no discharge. Left eye exhibits no discharge.  Left eye is hazy- pt reports this is chronic from her hx of glaucoma. EOMs intact. Eyes feel soft bilaterally.  Bilateral eyes were anesthetized with proparacaine and pressures were checked with Tono-Pen. Right eye pressures were 26, 28 and 31. Left eye pressures were inconsistent at 77, 28, 26 and 24- but the patient is not symptomatic in that eye.   Neck: Normal range of motion. Neck supple.  No meningeal signs.   Cardiovascular: Normal rate, regular rhythm, normal heart sounds and intact distal pulses.  Exam reveals no gallop and no friction rub.   No murmur heard. Pulmonary/Chest: Effort normal and breath sounds normal. No respiratory distress. She has no wheezes. She has no rales.  Abdominal: Soft. There is no tenderness.  Musculoskeletal: She exhibits no edema.  Lymphadenopathy:    She has no cervical adenopathy.    Neurological: She is alert and oriented to person, place, and time. No cranial nerve deficit. Coordination normal.  Patient is alert and oriented. Cranial nerves are intact. Speech is clear and coherent. EOMs are intact. Good strength and sensation in her bilateral upper and lower changes.  Skin: Skin is warm and dry. Capillary refill takes less than 2 seconds. No rash noted. She is not diaphoretic. No erythema. No pallor.  Psychiatric: She has a normal mood and affect. Her behavior is normal.  Nursing note and vitals reviewed.    ED Treatments / Results  Labs (all labs ordered are listed, but only abnormal results are displayed) Labs Reviewed  I-STAT CHEM 8, ED - Abnormal; Notable for the following:       Result Value   Glucose, Bld 261 (*)    All other components within normal limits    EKG  EKG Interpretation None       Radiology No results found.  Procedures Procedures (including critical care time)  Medications Ordered in ED Medications  sodium chloride 0.9 % bolus 1,000 mL (0 mLs Intravenous Stopped 12/31/15 0957)  metoCLOPramide (REGLAN) injection 10 mg (10 mg Intravenous Given 12/31/15 0738)  diphenhydrAMINE (BENADRYL) injection 25 mg (25 mg Intravenous Given 12/31/15 0738)  fluorescein ophthalmic strip 1 strip (1 strip Both Eyes Given 12/31/15 0739)  proparacaine (ALCAINE) 0.5 % ophthalmic solution 1 drop (1 drop Both Eyes Given 12/31/15 0739)  ketorolac (TORADOL) 30 MG/ML injection 15 mg (15 mg Intravenous Given 12/31/15 0841)     Initial Impression / Assessment and Plan / ED Course  I have reviewed the triage vital signs and the nursing notes.  Pertinent labs & imaging results that were available during my care of the patient were reviewed by me and considered in my medical decision making (see chart for details).  Clinical Course   This s a 45 y.o. Female with a history of DM, glaucoma of her left eye, and uveitis who presents to the ED with her daughter  complaining of a gradually worsening headache for two days. She reports a right sided headache and she has pain on the right side of her face and back to the back of her head. She also reports nasal congestion and sinus pain and pressure. She reports pain behind  her left eye. She reports photophobia. She has a history of glaucoma to her left eye. She reports this does not feel like her glaucoma or uveitis. She denies head injury or trauma.  On exam the patient is afebrile nontoxic appearing. She has no meningeal signs. No focal neurological deficits. Patient started on migraine cocktail. Will check intraocular pressures and visual acuity. Intraocular pressures were consistently around 28 on her right eye and there is a 1 inconsistent read in her left eye which has a history of glaucoma. It is currently not affected and she has no pain there. This is reassuring. We had some difficulty obtaining visual acuity as the patient does not speak English and has difficulty with the letters. I attempted to check her vision grossly in both her eyes and they are grossly intact. Recheck after migraine cocktail and Toradol patient reports her headache has resolved. She is feeling much better and is ready to go home. She has follow-up with her ophthalmologist in 2 days. I encouraged her to keep this appointment for a complete eye exam to ensure no glaucoma. Patient agrees with plan. I advised the patient to follow-up with their primary care provider this week. I advised the patient to return to the emergency department with new or worsening symptoms or new concerns. The patient and her daughter verbalized understanding and agreement with plan.    This patient was discussed with Dr. Tamera Punt who agrees with assessment and plan.   Final Clinical Impressions(s) / ED Diagnoses   Final diagnoses:  Bad headache  Nasal congestion    New Prescriptions Discharge Medication List as of 12/31/2015 10:47 AM    START taking these  medications   Details  cetirizine (ZYRTEC ALLERGY) 10 MG tablet Take 1 tablet (10 mg total) by mouth daily., Starting Sun 12/31/2015, Print         Waynetta Pean, PA-C 12/31/15 [place], MD 01/06/16 [phone]

## 2015-12-31 NOTE — ED Notes (Signed)
Tonopen at bedside.  

## 2015-12-31 NOTE — ED Notes (Signed)
Visual acuity not completed, pt. Not able to speak english and not needed for full diagnosis

## 2016-02-08 ENCOUNTER — Ambulatory Visit (INDEPENDENT_AMBULATORY_CARE_PROVIDER_SITE_OTHER): Payer: Medicaid Other | Admitting: Internal Medicine

## 2016-02-08 ENCOUNTER — Encounter: Payer: Self-pay | Admitting: Internal Medicine

## 2016-02-08 VITALS — BP 147/68 | HR 80 | Temp 98.2°F | Ht 64.0 in | Wt 216.0 lb

## 2016-02-08 DIAGNOSIS — R296 Repeated falls: Secondary | ICD-10-CM | POA: Diagnosis not present

## 2016-02-08 DIAGNOSIS — Z794 Long term (current) use of insulin: Secondary | ICD-10-CM

## 2016-02-08 DIAGNOSIS — E559 Vitamin D deficiency, unspecified: Secondary | ICD-10-CM

## 2016-02-08 DIAGNOSIS — R600 Localized edema: Secondary | ICD-10-CM | POA: Diagnosis not present

## 2016-02-08 DIAGNOSIS — E11649 Type 2 diabetes mellitus with hypoglycemia without coma: Secondary | ICD-10-CM | POA: Diagnosis not present

## 2016-02-08 DIAGNOSIS — E113212 Type 2 diabetes mellitus with mild nonproliferative diabetic retinopathy with macular edema, left eye: Secondary | ICD-10-CM

## 2016-02-08 DIAGNOSIS — E1165 Type 2 diabetes mellitus with hyperglycemia: Secondary | ICD-10-CM

## 2016-02-08 DIAGNOSIS — D509 Iron deficiency anemia, unspecified: Secondary | ICD-10-CM | POA: Diagnosis not present

## 2016-02-08 DIAGNOSIS — Z79899 Other long term (current) drug therapy: Secondary | ICD-10-CM | POA: Diagnosis not present

## 2016-02-08 DIAGNOSIS — E113219 Type 2 diabetes mellitus with mild nonproliferative diabetic retinopathy with macular edema, unspecified eye: Secondary | ICD-10-CM

## 2016-02-08 LAB — GLUCOSE, CAPILLARY: GLUCOSE-CAPILLARY: 217 mg/dL — AB (ref 65–99)

## 2016-02-08 LAB — POCT GLYCOSYLATED HEMOGLOBIN (HGB A1C): HEMOGLOBIN A1C: 12.2

## 2016-02-08 MED ORDER — GLIPIZIDE 5 MG PO TABS: 5.0000 mg | ORAL_TABLET | Freq: Every day | ORAL | 2 refills | Status: DC

## 2016-02-08 MED ORDER — VITAMIN D 1000 UNITS PO TABS: 1000.0000 [IU] | ORAL_TABLET | Freq: Every day | ORAL | Status: DC

## 2016-02-08 MED ORDER — FERROUS SULFATE 325 (65 FE) MG PO TABS: 325.0000 mg | ORAL_TABLET | Freq: Every day | ORAL | 3 refills | Status: DC

## 2016-02-08 NOTE — Progress Notes (Signed)
   CC: leg swelling  HPI:  [name] is a 45 y.o. with a PMH of T2DM with peripheral neuropathy, glaucoma, iron deficiency anemia, HLD, vitamin D deficiency presenting to clinic for f/u on diabetes and leg swelling.  A [place] Arabic interpreter via video conference was used to facilitate visit.  DM: Novolin 54U qAM/ 44U qPM (but takes 50/40); metformin 1000mg  BID. Last A1c was 13.2 in July 2017. Patient states compliance with regimen; she has missed some doses of metformin, which she sometimes holds when her sugar is low. She checks her CBG's in the mornings every couple of days; her meter readings are all morning time and range from 68 to ~400. She reports about 4 episodes of hypoglycemia in the last month. She has previously been on lantus, v-go, and byetta, but has had hypoglycemic episodes mostly in the morning with each of these.  She states she has fallen twice on her outside stairs in the last month due to not being able to see in the bright sunlight. She states she fell on her knees and denies hitting her head. She is now scared about going outside in case she falls again. She states that she has no trouble moving around her house and is able to do chores and activities of daily living without problem. She states she has not even gone on her walks after the sun has set due to fear of falling. She denies offer of OT for evaluation of home and suggestions for modifications from them.   Please see problem based Assessment and Plan for status of patients chronic conditions.  Past Medical History:  Diagnosis Date  . Anemia, iron deficiency   . Blindness of left eye   . Decreased visual acuity    Left eye  . Diabetes mellitus without complication ([place])   . Glaucoma associated with ocular inflammation   . Hair loss   . History of fracture of clavicle 05/18/2015  . Hypercholesteremia   . Hyperlipidemia   . Pap smear abnormality of cervix with LGSIL   . Routine/ritual circumcision    . Type II diabetes mellitus ([place])   . Uveitis     Review of Systems:   Review of Systems  Constitutional: Negative for chills and fever.  Eyes: Positive for blurred vision.  Respiratory: Negative for shortness of breath.   Cardiovascular: Positive for leg swelling. Negative for chest pain, palpitations and orthopnea.  Gastrointestinal: Negative for abdominal pain, blood in stool, constipation, diarrhea, nausea and vomiting.  Musculoskeletal: Positive for falls.  Neurological: Negative for dizziness, tingling, sensory change, weakness and headaches.    Physical Exam:  Vitals:   02/08/16 1501  BP: (!) 147/68  Pulse: 80  Temp: 98.2 F (36.8 C)  TempSrc: Oral  SpO2: 100%  Weight: 216 lb (98 kg)  Height: 5\' 4"  (1.626 m)   Physical Exam Constitutional: NAD, pleasant CV: RRR, 2/6 murmur best heard at RUSB, no rubs or gallops, 2+ pedal and ankle pitting edema, 1+ pretibial pitting edema bilaterally Resp: CTAB, no increased work of breathing, no crackles or wheezing Abd: soft, +BS, NDNT MSK: No bony tenderness on bilateral knees, moves all 4 limbs freely, LE strength and sensation intact Assessment & Plan:   See Encounters Tab for problem based charting.   Patient seen with Dr. Nilsa Nutting, MD Internal Medicine PGY1

## 2016-02-08 NOTE — Patient Instructions (Addendum)
Keep taking your metformin 1000mg  twice daily. Take this every day.  Keep taking your insulin 54 units in the morning and 45 units in the evening. Take this regularly, and keep eating regular meals throughout the day.  We will start a new diabetes medicine called Glipizide. You will take one pill once a day.   For your swelling in your feet, please elevate your legs and get compression stockings from the pharmacy. These will help take down the swelling.

## 2016-02-09 LAB — FERRITIN: Ferritin: 40 ng/mL (ref 15–150)

## 2016-02-09 MED ORDER — VITAMIN D (CHOLECALCIFEROL) 25 MCG (1000 UT) PO CAPS: 1000.0000 mg | ORAL_CAPSULE | Freq: Every day | ORAL | 5 refills | Status: DC

## 2016-02-09 MED ORDER — ATORVASTATIN CALCIUM 40 MG PO TABS: 40.0000 mg | ORAL_TABLET | Freq: Every day | ORAL | 5 refills | Status: DC

## 2016-02-09 NOTE — Assessment & Plan Note (Addendum)
Patient with poorly controlled T2DM. Supposed to be on Novolin 54U qAM and 44U qPM (but takes 50/40) and metformin 1000mg  BID. Last A1C in July 2017 was 13.2. AM CBG readings only range from 68 up to 419. She reports four episodes of hypoglycemia in the past month. When this happens she will not take metfomin as indicated. Patient has been on lantus, v-go and byetta in the past but has had AM hypoglycemic episodes with these as well.   Plan: --check A1c - 12.2 --discussed importance of eating 3 regular meals throughout the day; will discuss this further at f/u visit --discussed importance of taking her medications as indicated --continue novolin 54/44 and metformin 1000mg  BID --start glipizide 5mg  daily

## 2016-02-09 NOTE — Assessment & Plan Note (Addendum)
Patient with new onset bilateral lower extremity edema x 3 days. She denies shortness of breath, chest pain, headaches, changes in vision or hearing. She has multiple risk factors including uncontrolled DM, HLD, and obesity that are concerning for CAD.   Plan: --will obtain ECHO --advised patient to obtain compression stockings

## 2016-02-09 NOTE — Assessment & Plan Note (Signed)
Patient with Fe-deficiency anemia on replacement. Last Hgb 11.6. She states she has not been taking her iron since she ran out of it.   Plan: --refill ferrous sulfate 325mg  --check ferritin - 40 --will check CBC and ferritin at next visit

## 2016-02-09 NOTE — Assessment & Plan Note (Signed)
Patient on vitamin D replacement; states she ran out of medicine.   Plan: --refill Vit D 1000mg  daily

## 2016-02-12 NOTE — Progress Notes (Signed)
Internal Medicine Clinic Attending  I saw and evaluated the patient.  I personally confirmed the key portions of the history and exam documented by Dr. Svalina and I reviewed pertinent patient test results.  The assessment, diagnosis, and plan were formulated together and I agree with the documentation in the resident's note.  

## 2016-04-01 ENCOUNTER — Ambulatory Visit (INDEPENDENT_AMBULATORY_CARE_PROVIDER_SITE_OTHER): Payer: Medicaid Other | Admitting: Internal Medicine

## 2016-04-01 ENCOUNTER — Encounter: Payer: Self-pay | Admitting: Internal Medicine

## 2016-04-01 ENCOUNTER — Encounter (INDEPENDENT_AMBULATORY_CARE_PROVIDER_SITE_OTHER): Payer: Self-pay

## 2016-04-01 VITALS — BP 143/66 | HR 83 | Temp 97.9°F | Ht 66.0 in | Wt 212.6 lb

## 2016-04-01 DIAGNOSIS — D509 Iron deficiency anemia, unspecified: Secondary | ICD-10-CM | POA: Diagnosis not present

## 2016-04-01 DIAGNOSIS — Z794 Long term (current) use of insulin: Secondary | ICD-10-CM | POA: Diagnosis not present

## 2016-04-01 DIAGNOSIS — R600 Localized edema: Secondary | ICD-10-CM

## 2016-04-01 DIAGNOSIS — E113219 Type 2 diabetes mellitus with mild nonproliferative diabetic retinopathy with macular edema, unspecified eye: Secondary | ICD-10-CM

## 2016-04-01 DIAGNOSIS — E1142 Type 2 diabetes mellitus with diabetic polyneuropathy: Secondary | ICD-10-CM | POA: Diagnosis not present

## 2016-04-01 DIAGNOSIS — E113212 Type 2 diabetes mellitus with mild nonproliferative diabetic retinopathy with macular edema, left eye: Secondary | ICD-10-CM

## 2016-04-01 DIAGNOSIS — E1165 Type 2 diabetes mellitus with hyperglycemia: Secondary | ICD-10-CM

## 2016-04-01 DIAGNOSIS — Z79899 Other long term (current) drug therapy: Secondary | ICD-10-CM

## 2016-04-01 DIAGNOSIS — R5383 Other fatigue: Secondary | ICD-10-CM | POA: Diagnosis not present

## 2016-04-01 LAB — GLUCOSE, CAPILLARY: Glucose-Capillary: 410 mg/dL — ABNORMAL HIGH (ref 65–99)

## 2016-04-01 MED ORDER — FERROUS SULFATE 325 (65 FE) MG PO TABS: 325.0000 mg | ORAL_TABLET | Freq: Every day | ORAL | 3 refills | Status: DC

## 2016-04-01 MED ORDER — SITAGLIPTIN PHOSPHATE 100 MG PO TABS: 100.0000 mg | ORAL_TABLET | Freq: Every day | ORAL | 6 refills | Status: DC

## 2016-04-01 MED ORDER — GABAPENTIN 400 MG PO CAPS: 400.0000 mg | ORAL_CAPSULE | Freq: Every day | ORAL | 2 refills | Status: DC

## 2016-04-01 NOTE — Progress Notes (Signed)
   CC: Swelling in feet  HPI:  [name] is a 45 y.o. with past medical history as outlined below who presents to clinic for swelling in her feet. This has been going on for 2-1/2 month. Swelling improves with elevating her leg and is worse at the end of the day. She denies chest pain, orthopnea, shortness of breath, and weight gain. Along with swelling she has a burning pins and needle sensation in both feet that is symmetrical bilaterally. She was evaluated for this on October 19 in clinic. An echo was ordered but has not been done.  She is also planning of increased fatigue over the past 9 months. She'll take 4-5 naps throughout the day. She states she snores at night. Denies morning headache.  Please see problem list for further details of patient's chronic medical issues.  Past Medical History:  Diagnosis Date  . Anemia, iron deficiency   . Blindness of left eye   . Decreased visual acuity    Left eye  . Diabetes mellitus without complication ([place])   . Glaucoma associated with ocular inflammation   . Hair loss   . History of fracture of clavicle 05/18/2015  . Hypercholesteremia   . Hyperlipidemia   . Pap smear abnormality of cervix with LGSIL   . Routine/ritual circumcision   . Type II diabetes mellitus ([place])   . Uveitis     Review of Systems:  Pertinent review of systems noted in history of present illness. Review of systems otherwise negative.  Physical Exam:  Vitals:   04/01/16 1352  BP: (!) 143/66  Pulse: 83  Temp: 97.9 F (36.6 C)  TempSrc: Oral  SpO2: 99%  Weight: 212 lb 9.6 oz (96.4 kg)  Height: 5\' 6"  (QA348G m)   Physical Exam  Constitutional: She is oriented to person, place, and time. She appears well-developed and well-nourished. No distress.  HENT:  Head: Normocephalic and atraumatic.  Nose: Nose normal.  Cardiovascular: Normal rate, regular rhythm and normal heart sounds.  Exam reveals no gallop and no friction rub.   No murmur  heard. Pulmonary/Chest: Effort normal and breath sounds normal. No respiratory distress. She has no wheezes. She has no rales.  Abdominal: Soft. Bowel sounds are normal. She exhibits no distension. There is no tenderness. There is no rebound and no guarding.   Skin: 2+ DP pulses bilaterally, old healing scab on 3 digit of left foot.  Extremities: Trace pedal edema bilaterally. Shiny skin on bilateral shins without any hair on shins. Skin over her shins is hyperpigmented.   Assessment & Plan:   See Encounters Tab for problem based charting.  Patient discussed with Dr. Lynnae January

## 2016-04-01 NOTE — Patient Instructions (Addendum)
For your diabetes take:   1. Metformin 1000mg  twice a day 2. Glipizide 5mg  once a day 3. Humulin Kwik Pen        -54 units in the morning       - 44 units in the evening 4. januvia 100mg  once a day  For your foot pain take Gabapentin 400mg   Once a day   For your fatigue I ordered a sleep study to evaluate for obstructive sleep apnea.    Sleep Apnea Sleep apnea is a condition that affects breathing. People with sleep apnea have moments during sleep when their breathing pauses briefly or gets shallow. Sleep apnea can cause these symptoms:  Trouble staying asleep.  Sleepiness or tiredness during the day.  Irritability.  Loud snoring.  Morning headaches.  Trouble concentrating.  Forgetting things.  Less interest in sex.  Being sleepy for no reason.  Mood swings.  Personality changes.  Depression.  Waking up a lot during the night to pee (urinate).  Dry mouth.  Sore throat. Follow these instructions at home:  Make any changes in your routine that your doctor recommends.  Eat a healthy, well-balanced diet.  Take over-the-counter and prescription medicines only as told by your doctor.  Avoid using alcohol, calming medicines (sedatives), and narcotic medicines.  Take steps to lose weight if you are overweight.  If you were given a machine (device) to use while you sleep, use it only as told by your doctor.  Do not use any tobacco products, such as cigarettes, chewing tobacco, and e-cigarettes. If you need help quitting, ask your doctor.  Keep all follow-up visits as told by your doctor. This is important. Contact a doctor if:  The machine that you were given to use during sleep is uncomfortable or does not seem to be working.  Your symptoms do not get better.  Your symptoms get worse. Get help right away if:  Your chest hurts.  You have trouble breathing in enough air (shortness of breath).  You have an uncomfortable feeling in your back, arms, or  stomach.  You have trouble talking.  One side of your body feels weak.  A part of your face is hanging down (drooping). These symptoms may be an emergency. Do not wait to see if the symptoms will go away. Get medical help right away. Call your local emergency services (911 in the [place] Do not drive yourself to the hospital.  This information is not intended to replace advice given to you by your health care provider. Make sure you discuss any questions you have with your health care provider. Document Released: 01/16/2008 Document Revised: 12/03/2015 Document Reviewed: 01/16/2015 Elsevier Interactive Patient Education  2017 [employer].   Venous Stasis or Chronic Venous Insufficiency Chronic venous insufficiency, also called venous stasis, is a condition that affects the veins in the legs. The condition prevents blood from being pumped through these veins effectively. Blood may no longer be pumped effectively from the legs back to the heart. This condition can range from mild to severe. With proper treatment, you should be able to continue with an active life. CAUSES  Chronic venous insufficiency occurs when the vein walls become stretched, weakened, or damaged or when valves within the vein are damaged. Some common causes of this include:  High blood pressure inside the veins (venous hypertension).  Increased blood pressure in the leg veins from long periods of sitting or standing.  A blood clot that blocks blood flow in a vein (deep vein  thrombosis).  Inflammation of a superficial vein (phlebitis) that causes a blood clot to form. RISK FACTORS Various things can make you more likely to develop chronic venous insufficiency, including:  Family history of this condition.  Obesity.  Pregnancy.  Sedentary lifestyle.  Smoking.  Jobs requiring long periods of standing or sitting in one place.  Being a certain age. Women in their 79s and 10s and men in their 29s are more likely  to develop this condition. SIGNS AND SYMPTOMS  Symptoms may include:   Varicose veins.  Skin breakdown or ulcers.  Reddened or discolored skin on the leg.  Brown, smooth, tight, and painful skin just above the ankle, usually on the inside surface (lipodermatosclerosis).  Swelling. DIAGNOSIS  To diagnose this condition, your health care provider will take a medical history and do a physical exam. The following tests may be ordered to confirm the diagnosis:  Duplex ultrasound-A procedure that produces a picture of a blood vessel and nearby organs and also provides information on blood flow through the blood vessel.  Plethysmography-A procedure that tests blood flow.  A venogram, or venography-A procedure used to look at the veins using X-ray and dye. TREATMENT The goals of treatment are to help you return to an active life and to minimize pain or disability. Treatment will depend on the severity of the condition. Medical procedures may be needed for severe cases. Treatment options may include:   Use of compression stockings. These can help with symptoms and lower the chances of the problem getting worse, but they do not cure the problem.  Sclerotherapy-A procedure involving an injection of a material that "dissolves" the damaged veins. Other veins in the network of blood vessels take over the function of the damaged veins.  Surgery to remove the vein or cut off blood flow through the vein (vein stripping or laser ablation surgery).  Surgery to repair a valve. HOME CARE INSTRUCTIONS   Wear compression stockings as directed by your health care provider.  Only take over-the-counter or prescription medicines for pain, discomfort, or fever as directed by your health care provider.  Follow up with your health care provider as directed. SEEK MEDICAL CARE IF:   You have redness, swelling, or increasing pain in the affected area.  You see a red streak or line that extends up or down  from the affected area.  You have a breakdown or loss of skin in the affected area, even if the breakdown is small.  You have an injury to the affected area. SEEK IMMEDIATE MEDICAL CARE IF:   You have an injury and open wound in the affected area.  Your pain is severe and does not improve with medicine.  You have sudden numbness or weakness in the foot or ankle below the affected area, or you have trouble moving your foot or ankle.  You have a fever or persistent symptoms for more than 2-3 days.  You have a fever and your symptoms suddenly get worse. MAKE SURE YOU:   Understand these instructions.  Will watch your condition.  Will get help right away if you are not doing well or get worse. This information is not intended to replace advice given to you by your health care provider. Make sure you discuss any questions you have with your health care provider. Document Released: 08/12/2006 Document Revised: 01/27/2013 Document Reviewed: 12/14/2012 Elsevier Interactive Patient Education  2017 [place].   Diabetic Neuropathy Diabetic neuropathy is a nerve disease or nerve damage that  is caused by diabetes mellitus. About half of all people with diabetes mellitus have some form of nerve damage. Nerve damage is more common in those who have had diabetes mellitus for many years and who generally have not had good control of their blood sugar (glucose) level. Diabetic neuropathy is a common complication of diabetes mellitus. There are three common types of diabetic neuropathy and a fourth type that is less common and less understood:  Peripheral neuropathy-This is the most common type of diabetic neuropathy. It causes damage to the nerves of the feet and legs first and then eventually the hands and arms. The damage affects the ability to sense touch.  Autonomic neuropathy-This type causes damage to the autonomic nervous system, which controls the following  functions:  Heartbeat.  Body temperature.  Blood pressure.  Urination.  Digestion.  Sweating.  Sexual function.  Focal neuropathy-Focal neuropathy can be painful and unpredictable and occurs most often in older adults with diabetes mellitus. It involves a specific nerve or one area and often comes on suddenly. It usually does not cause long-term problems.  Radiculoplexus neuropathy- Sometimes called lumbosacral radiculoplexus neuropathy, radiculoplexus neuropathy affects the nerves of the thighs, hips, buttocks, or legs. It is more common in people with type 2 diabetes mellitus and in older men. It is characterized by debilitating pain, weakness, and atrophy, usually in the thigh muscles. What are the causes? The cause of peripheral, autonomic, and focal neuropathies is diabetes mellitus that is uncontrolled and high glucose levels. The cause of radiculoplexus neuropathy is unknown. However, it is thought to be caused by inflammation related to uncontrolled glucose levels. What are the signs or symptoms? Peripheral Neuropathy  Peripheral neuropathy develops slowly over time. When the nerves of the feet and legs no longer work there may be:  Burning, stabbing, or aching pain in the legs or feet.  Inability to feel pressure or pain in your feet. This can lead to:  Thick calluses over pressure areas.  Pressure sores.  Ulcers.  Foot deformities.  Reduced ability to feel temperature changes.  Muscle weakness. Autonomic Neuropathy  The symptoms of autonomic neuropathy vary depending on which nerves are affected. Symptoms may include:  Problems with digestion, such as:  Feeling sick to your stomach (nausea).  Vomiting.  Bloating.  Constipation.  Diarrhea.  Abdominal pain.  Difficulty with urination. This occurs if you lose your ability to sense when your bladder is full. Problems include:  Urine leakage (incontinence).  Inability to empty your bladder completely  (retention).  Rapid or irregular heartbeat (palpitations).  Blood pressure drops when you stand up (orthostatic hypotension). When you stand up you may feel:  Dizzy.  Weak.  Faint.  In men, inability to attain and maintain an erection.  In women, vaginal dryness and problems with decreased sexual desire and arousal.  Problems with body temperature regulation.  Increased or decreased sweating. Focal Neuropathy  Abnormal eye movements or abnormal alignment of both eyes.  Weakness in the wrist.  Foot drop. This results in an inability to lift the foot properly and abnormal walking or foot movement.  Paralysis on one side of your face (Bell palsy).  Chest or abdominal pain. Radiculoplexus Neuropathy  Sudden, severe pain in your hip, thigh, or buttocks.  Weakness and wasting of thigh muscles.  Difficulty rising from a seated position.  Abdominal swelling.  Unexplained weight loss (usually more than 10 lb [4.5 kg]). How is this diagnosed? Peripheral Neuropathy  Your senses may be tested. Sensory function  testing can be done with:  A light touch using a monofilament.  A vibration with tuning fork.  A sharp sensation with a pin prick. Other tests that can help diagnose neuropathy are:  Nerve conduction velocity. This test checks the transmission of an electrical current through a nerve.  Electromyography. This shows how muscles respond to electrical signals transmitted by nearby nerves.  Quantitative sensory testing. This is used to assess how your nerves respond to vibrations and changes in temperature. Autonomic Neuropathy  Diagnosis is often based on reported symptoms. Tell your health care provider if you experience:  Dizziness.  Constipation.  Diarrhea.  Inappropriate urination or inability to urinate.  Inability to get or maintain an erection. Tests that may be done include:  Electrocardiography or Holter monitor. These are tests that can help show  problems with the heart rate or heart rhythm.  An X-ray exam may be done. Focal Neuropathy  Diagnosis is made based on your symptoms and what your health care provider finds during your exam. Other tests may be done. They may include:  Nerve conduction velocities. This checks the transmission of electrical current through a nerve.  Electromyography. This shows how muscles respond to electrical signals transmitted by nearby nerves.  Quantitative sensory testing. This test is used to assess how your nerves respond to vibration and changes in temperature. Radiculoplexus Neuropathy  Often the first thing is to eliminate any other issue or problems that might be the cause, as there is no standard test for diagnosis.  X-ray exam of your spine and lumbar region.  Spinal tap to rule out cancer.  MRI to rule out other lesions. How is this treated? Once nerve damage occurs, it cannot be reversed. The goal of treatment is to keep the disease or nerve damage from getting worse and affecting more nerve fibers. Controlling your blood glucose level is the key. Most people with radiculoplexus neuropathy see at least a partial improvement over time. You will need to keep your blood glucose and HbA1c levels in the target range determined by your health care provider. Things that help control blood glucose levels include:  Blood glucose monitoring.  Meal planning.  Physical activity.  Diabetes medicine. Over time, maintaining lower blood glucose levels helps lessen symptoms. Sometimes, prescription pain medicine is needed. Follow these instructions at home:  Do not smoke.  Keep your blood glucose level in the range that you and your health care provider have determined acceptable for you.  Keep your blood pressure level in the range that you and your health care provider have determined acceptable for you.  Eat a well-balanced diet.  Be physically active every day. Include strength training and  balance exercises.  Protect your feet.  Check your feet every day for sores, cuts, blisters, or signs of infection.  Wear padded socks and supportive shoes. Use orthotic inserts, if necessary.  Regularly check the insides of your shoes for worn spots. Make sure there are no rocks or other items inside your shoes before you put them on. Contact a health care provider if:  You have burning, stabbing, or aching pain in the legs or feet.  You are unable to feel pressure or pain in your feet.  You develop problems with digestion such as:  Nausea.  Vomiting.  Bloating.  Constipation.  Diarrhea.  Abdominal pain.  You have difficulty with urination, such as:  Incontinence.  Retention.  You have palpitations.  You develop orthostatic hypotension. When you stand up you may  feel:  Dizzy.  Weak.  Faint.  You cannot attain and maintain an erection (in men).  You have vaginal dryness and problems with decreased sexual desire and arousal (in women).  You have severe pain in your thighs, legs, or buttocks.  You have unexplained weight loss. This information is not intended to replace advice given to you by your health care provider. Make sure you discuss any questions you have with your health care provider. Document Released: 06/17/2001 Document Revised: 09/14/2015 Document Reviewed: 09/17/2012 Elsevier Interactive Patient Education  2017 [employer].

## 2016-04-02 DIAGNOSIS — R5383 Other fatigue: Secondary | ICD-10-CM | POA: Insufficient documentation

## 2016-04-02 LAB — TSH: TSH: 2.89 u[IU]/mL (ref 0.450–4.500)

## 2016-04-02 LAB — VITAMIN B12: VITAMIN B 12: 562 pg/mL (ref 232–1245)

## 2016-04-02 NOTE — Assessment & Plan Note (Signed)
Assessment: Patient diabetes has been uncontrolled since 2007. On further questioning patient does not like to take medications and has fear of hypoglycemia. She also has vision issues and has difficulty administering herself her insulin. She has been evaluated by ophthalmology for eye surgery but has been denied because of her uncontrolled blood sugar and blood pressure.  Plan: Discussed with her daughter in the room who she lives with patient's medical issues. Her daughter will start helping her with her insulin and making sure that she is compliant with all of her medications. Started on Januvia 100 mg daily. Continue metformin 1000 mg twice a day, glipizide 5 mg daily, Humulin 54 units in the morning and 44 units in the evening.

## 2016-04-02 NOTE — Assessment & Plan Note (Signed)
Assessment: On exam patient has trace pedal edema bilaterally. She has signs of chronic venous stasis changes. She does not appear hypervolemic without any crackles on lung exam. Her weight has been stable she is 212 pounds today from 216 pounds during her last clinic visit.   Plan: Advised to get TED hose. Echo is currently being scheduled for patient.

## 2016-04-02 NOTE — Assessment & Plan Note (Signed)
Assessment: Requesting refill of iron supplements. Her last ferritin was within normal limits however she states that she feels better when taking iron supplements. She has a history of iron deficiency anemia requiring iron supplements in the past.  Plan: Refilled ferrous sulfate 325 mg once daily.

## 2016-04-02 NOTE — Assessment & Plan Note (Addendum)
Assessment: Patient complaining of fatigue and having to take multiple naps throughout the day. She snores at night and is overweight.  Plan: Discussed weight loss and obstructive sleep apnea. Ordered a sleep study for patient. TSH checked this visit within normal limits.

## 2016-04-02 NOTE — Assessment & Plan Note (Addendum)
Assessment: Patient complaining of neuropathy type pain. She also having occasional numbness with a heavy sensation in her feet. Her diabetes has been uncontrolled since 2007.  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 Foot Exam done today. Stressed importance of wearing shoes in her house and checking her feet daily. Ordered vitamin B12 which came back within normal limits.

## 2016-04-05 NOTE — Progress Notes (Signed)
Internal Medicine Clinic Attending  Case discussed with Dr. Ander Gaster at the time of the visit.  We reviewed the resident's history and exam and pertinent patient test results.  I agree with the assessment, diagnosis, and plan of care documented in the resident's note.

## 2016-04-17 ENCOUNTER — Ambulatory Visit (HOSPITAL_COMMUNITY)
Admission: RE | Admit: 2016-04-17 | Discharge: 2016-04-17 | Disposition: A | Discharge status: Home / Self Care | Payer: Medicaid Other | Source: Ambulatory Visit | Attending: Student in an Organized Health Care Education/Training Program | Admitting: Student in an Organized Health Care Education/Training Program

## 2016-04-17 DIAGNOSIS — R600 Localized edema: Secondary | ICD-10-CM | POA: Diagnosis present

## 2016-04-17 DIAGNOSIS — I340 Nonrheumatic mitral (valve) insufficiency: Secondary | ICD-10-CM | POA: Insufficient documentation

## 2016-04-17 DIAGNOSIS — R011 Cardiac murmur, unspecified: Secondary | ICD-10-CM | POA: Insufficient documentation

## 2016-04-17 DIAGNOSIS — I071 Rheumatic tricuspid insufficiency: Secondary | ICD-10-CM | POA: Insufficient documentation

## 2016-04-17 NOTE — Progress Notes (Signed)
  Echocardiogram 2D Echocardiogram has been performed.  Jennifer [name] 04/17/2016, 12:56 PM

## 2016-04-26 ENCOUNTER — Telehealth: Payer: Self-pay | Admitting: Internal Medicine

## 2016-04-26 NOTE — Telephone Encounter (Signed)
APT. REMINDER CALL, LMTCB °

## 2016-04-29 ENCOUNTER — Encounter (INDEPENDENT_AMBULATORY_CARE_PROVIDER_SITE_OTHER): Payer: Self-pay

## 2016-04-29 ENCOUNTER — Ambulatory Visit (INDEPENDENT_AMBULATORY_CARE_PROVIDER_SITE_OTHER): Payer: Medicaid Other | Admitting: Internal Medicine

## 2016-04-29 DIAGNOSIS — R600 Localized edema: Secondary | ICD-10-CM

## 2016-04-29 DIAGNOSIS — Z794 Long term (current) use of insulin: Secondary | ICD-10-CM

## 2016-04-29 DIAGNOSIS — E113212 Type 2 diabetes mellitus with mild nonproliferative diabetic retinopathy with macular edema, left eye: Secondary | ICD-10-CM

## 2016-04-29 MED ORDER — GLUCOSE BLOOD VI STRP: ORAL_STRIP | 5 refills | Status: DC

## 2016-04-29 MED ORDER — ACCU-CHEK SOFT TOUCH LANCETS MISC: 11 refills | Status: DC

## 2016-04-29 MED ORDER — INSULIN ISOPHANE HUMAN 100 UNIT/ML KWIKPEN: PEN_INJECTOR | SUBCUTANEOUS | 2 refills | Status: DC

## 2016-04-29 MED ORDER — INSULIN PEN NEEDLE 31G X 5 MM MISC: 1.0000 | Freq: Two times a day (BID) | 11 refills | Status: DC

## 2016-04-29 MED ORDER — GLIPIZIDE 5 MG PO TABS: 5.0000 mg | ORAL_TABLET | Freq: Every day | ORAL | 1 refills | Status: DC

## 2016-04-29 NOTE — Patient Instructions (Signed)
It was a pleasure to see you and your family Jennifer [name].  Your recent echocardiogram showed mild diastolic dysfunction. This means that the heart does not relax and refill normally so does not work as well as usual. With good blood pressure control this should usually not become a larger problem but warning signs to look out for are worsening leg swelling or shortness of breath with activity.  I have sent refills for your diabetes medications and supplies. If you are unable to fill any of them at the pharmacy please have them call our clinic for refills so you can get these medications.  I would like you to return to the clinic at the end of the month for a recheck and to test your a1c. We need to work hard on your blood sugars to avoid more problems going forward.  I will try to contact the hospital social worker about helping you get family in town to help take care of you in February but this might be a slow process.

## 2016-04-29 NOTE — Progress Notes (Signed)
   CC: Diabetes medication refills, follow up test results  HPI:  Jennifer [name] is a 46 y.o. [place] woman with a history of uncontrolled type 2 diabetes, iron deficiency anemia, and left eye enucleation presenting today for refills of her diabetes medications and to ask about the results of her recent echocardiogram. This test was performed due to a heart murmur and lower extremity edema and he demonstrated grade 1 diastolic dysfunction as well as mild mitral valve regurgitation and slightly elevated PA pressure. Her blood sugars have been much lower since her last visit in part attributable to her son replacing many of her refined sweets at home. This is resulting in 3 or 4 episodes of symptomatic hypoglycemia that quickly resolved with drinking juice. He has however most commonly out of town due to his job as a Administrator. Her daughter is her primary assistance around the house. She is now scheduled for surgery on her right eye in early February through [facility] system opthalmology.   See problem based assessment and plan below for additional details  Past Medical History:  Diagnosis Date  . Anemia, iron deficiency   . Blindness of left eye   . Decreased visual acuity    Left eye  . Diabetes mellitus without complication ([place])   . Glaucoma associated with ocular inflammation   . Hair loss   . History of fracture of clavicle 05/18/2015  . Hypercholesteremia   . Hyperlipidemia   . Pap smear abnormality of cervix with LGSIL   . Routine/ritual circumcision   . Type II diabetes mellitus ([place])   . Uveitis     Review of Systems:  Review of Systems  Eyes: Positive for blurred vision. Negative for photophobia and pain.  Respiratory: Negative for shortness of breath.   Gastrointestinal: Negative for diarrhea.  Genitourinary: Negative for frequency.  Neurological: Negative for headaches.  Endo/Heme/Allergies: Negative for polydipsia.    Physical Exam: Physical Exam  Constitutional:  She is oriented to person, place, and time and well-developed, well-nourished, and in no distress.  HENT:  Head: Normocephalic.  Mouth/Throat: Oropharynx is clear and moist.  Eyes:  Left eye status post enucleation  Cardiovascular: Normal rate and regular rhythm.   Pulmonary/Chest: Effort normal and breath sounds normal.  Musculoskeletal: She exhibits no tenderness.  1+ pretibial edema bilaterally  Neurological: She is alert and oriented to person, place, and time.  Skin: Skin is warm and dry. No rash noted.    Vitals:   04/29/16 1417  BP: 120/64  Pulse: 87  Temp: 98.5 F (36.9 C)  TempSrc: Oral  SpO2: 100%  Weight: 216 lb 1.6 oz (98 kg)  Height: 5\' 6"  (1.676 m)    Assessment & Plan:   See Encounters Tab for problem based charting.  Patient discussed with Dr. Angelia Mould

## 2016-05-01 NOTE — Assessment & Plan Note (Signed)
A: She is running out of several medications today and needs refills. She does not have a glucometer with her today for me to review her glycemic control. It sounds like she has been mostly compliant with metformin 1000mg  BID, Januvia 100mg  daily, glipizide 5mg , and Humalin 55U and 45U daily according to her daughter who is with her today. A barrier to her control is large diet variations leading to some hypoglycemic episodes on this regimen but also some high blood sugars.  P: Refill sent for NPH insulin, tips, and testing supplies Requested she bring her glucometer to the next visit so we can review it

## 2016-05-01 NOTE — Progress Notes (Signed)
Internal Medicine Clinic Attending  Case discussed with Dr. Rice at the time of the visit.  We reviewed the resident's history and exam and pertinent patient test results.  I agree with the assessment, diagnosis, and plan of care documented in the resident's note.  

## 2016-05-01 NOTE — Assessment & Plan Note (Signed)
A: I discussed the results of her recent echocardiogram showing grade I diastolic dysfunction, mild mitral regurgitation, and peak PA pressure of 32. Her lower extremity edema is not severe.  It seems likely this is coming from venous insufficiency rather than her heart given the mild echo result.  P: -Recommended compression hose for symptom control

## 2016-05-16 ENCOUNTER — Telehealth: Payer: Self-pay | Admitting: Internal Medicine

## 2016-05-16 NOTE — Telephone Encounter (Signed)
Calling about a letter that was supposed to be sent about brother. Says they havent received  it yet

## 2016-05-16 NOTE — Telephone Encounter (Signed)
Pt's son states that he and pt requested a letter for her brother to come to [place] to stay with her during her recovery time after surgery, he states the doctor told him they would have the letter in 7 days and that was 1/8, he states they really need this letter so pt's brother can get here from their country. He will check the correct spelling again and will give that to me tomorrow morning so the letter can get done I am sending this to dr rice, dr Jari Favre and dr Lynnae January so it can be cosigned by an attending

## 2016-05-17 ENCOUNTER — Encounter: Payer: Self-pay | Admitting: Internal Medicine

## 2016-05-17 NOTE — Telephone Encounter (Signed)
I recall this conversation and typing a letter similar to that previously entered by Dr. Naaman Plummer a few years prior requesting similar assistance from her family member in [place]. I do not recall what interrupted the process at the time. I do think I am at fault for this as I clearly did not complete it and there is nothing documented. I will call this morning to confirm her brother's demographic information which is required for such a request and then finish this.

## 2016-05-20 NOTE — Telephone Encounter (Signed)
Note is completed and available for pickup today per her son's request.

## 2016-06-25 ENCOUNTER — Encounter: Payer: Self-pay | Admitting: Internal Medicine

## 2016-06-25 ENCOUNTER — Ambulatory Visit (INDEPENDENT_AMBULATORY_CARE_PROVIDER_SITE_OTHER): Payer: Medicaid Other | Admitting: Internal Medicine

## 2016-06-25 VITALS — BP 156/69 | HR 83 | Temp 98.2°F | Wt 220.9 lb

## 2016-06-25 DIAGNOSIS — E1139 Type 2 diabetes mellitus with other diabetic ophthalmic complication: Secondary | ICD-10-CM | POA: Diagnosis not present

## 2016-06-25 DIAGNOSIS — E113219 Type 2 diabetes mellitus with mild nonproliferative diabetic retinopathy with macular edema, unspecified eye: Secondary | ICD-10-CM

## 2016-06-25 DIAGNOSIS — R600 Localized edema: Secondary | ICD-10-CM

## 2016-06-25 DIAGNOSIS — H409 Unspecified glaucoma: Secondary | ICD-10-CM | POA: Diagnosis not present

## 2016-06-25 DIAGNOSIS — H2012 Chronic iridocyclitis, left eye: Secondary | ICD-10-CM

## 2016-06-25 DIAGNOSIS — E11649 Type 2 diabetes mellitus with hypoglycemia without coma: Secondary | ICD-10-CM | POA: Diagnosis not present

## 2016-06-25 DIAGNOSIS — Z794 Long term (current) use of insulin: Secondary | ICD-10-CM | POA: Diagnosis not present

## 2016-06-25 DIAGNOSIS — H5440 Blindness, one eye, unspecified eye: Secondary | ICD-10-CM | POA: Diagnosis not present

## 2016-06-25 DIAGNOSIS — H2011 Chronic iridocyclitis, right eye: Secondary | ICD-10-CM

## 2016-06-25 DIAGNOSIS — E1136 Type 2 diabetes mellitus with diabetic cataract: Secondary | ICD-10-CM | POA: Diagnosis not present

## 2016-06-25 DIAGNOSIS — E113212 Type 2 diabetes mellitus with mild nonproliferative diabetic retinopathy with macular edema, left eye: Secondary | ICD-10-CM

## 2016-06-25 LAB — POCT GLYCOSYLATED HEMOGLOBIN (HGB A1C): Hemoglobin A1C: 10.2

## 2016-06-25 LAB — GLUCOSE, CAPILLARY: Glucose-Capillary: 180 mg/dL — ABNORMAL HIGH (ref 65–99)

## 2016-06-25 NOTE — Progress Notes (Signed)
CC: paperwork  HPI:  [name] is a 46 y.o. with a PMH listed below presenting to clinic with paperwork to be filled out and to follow up on her DM.   Patient is accompanied by son who acted as Astronomer. They bring [place] immigration paperwork for disability attestation requesting to be filled out. In speaking with our office staff, this is not appropriate to be filled out by anyone in our resident clinic. Patient and son were made aware that they need to go through their immigration lawyer for such paperwork.   DM: Patient with poorly controlled DM, last A1c of 12.2 in October. Patient on metformin 1000mg  BID - only taking once daily, Januvia and Humalin 50U qAM, 30U qPM; she was started on glipizide in January but has not been taking it. Patient has forgotten her meter today. Son states that she has had several low CBGs which are occurring mainly in the morning and are symptomatic. He states that she does not have a regular eating schedule and mainly just snacks throughout the day.   Patient will be undergoing right cataract surgery next week. 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 RPR was positive at 1:1, however TP-PA was negative meaning RPR was likely false positive (ID weighted in). She had positive ACE (80), neg ANCA screen, neg proteinase 3 ab, neg ANA, positive RA titer (20), neg CCP, increased total complement with mild inc C4, mildly elevated Beta 2 globulin at 0.5 (ref 0.1-0.4) otherwise normal SPEP; lyme, toxoplasmosis, quantiferron negative.   Please see problem based Assessment and Plan for status of patients chronic conditions.  Past Medical History:  Diagnosis Date  . Anemia, iron deficiency   . Blindness of left eye   . Decreased visual acuity    Left eye  . Diabetes mellitus without complication ([place])   . Glaucoma associated with ocular inflammation    . Hair loss   . History of fracture of clavicle 05/18/2015  . Hypercholesteremia   . Hyperlipidemia   . Pap smear abnormality of cervix with LGSIL   . Routine/ritual circumcision   . Type II diabetes mellitus ([place])   . Uveitis     Review of Systems:   Review of Systems  Constitutional: Negative for chills and fever.  HENT: Negative for hearing loss.   Eyes: Positive for blurred vision. Negative for double vision.  Respiratory: Negative for cough and shortness of breath.   Cardiovascular: Positive for leg swelling (stable). Negative for chest pain.  Gastrointestinal: Negative for abdominal pain, constipation, diarrhea, nausea and vomiting.  Musculoskeletal: Negative for falls.  Neurological: Positive for headaches (chronic from poor vision). Negative for dizziness.  Endo/Heme/Allergies:       Hypoglycemic episodes with nausea, weakness, diaphoresis   Physical Exam:  Vitals:   06/25/16 0933  BP: (!) 156/93  Pulse: 72  Temp: 98.2 F (36.8 C)  TempSrc: Oral  SpO2: 98%  Weight: 220 lb 14.4 oz (100.2 kg)   Physical Exam  Constitutional: She is oriented to person, place, and time. She appears well-developed and well-nourished. No distress.  HENT:  Head: Normocephalic and atraumatic.  Neck: Normal range of motion. Neck supple.  Cardiovascular: Normal rate, regular rhythm, normal heart sounds and intact distal pulses.   Pulmonary/Chest: Breath sounds normal. She has no wheezes. She has no rales.  Abdominal: Soft. Bowel sounds are normal. She exhibits no distension. There  is no tenderness. There is no guarding.  Musculoskeletal: Normal range of motion. She exhibits edema (mild bil LE edema). She exhibits no tenderness.  Neurological: She is alert and oriented to person, place, and time. No cranial nerve deficit (poor eyesight in right; blind in left). Coordination normal.  Skin: Skin is warm and dry. Capillary refill takes less than 2 seconds. No rash noted. She is not  diaphoretic. No erythema.    Assessment & Plan:   See Encounters Tab for problem based charting.   Patient discussed with Dr. Andris Baumann, MD Internal Medicine PGY1

## 2016-06-25 NOTE — Patient Instructions (Signed)
For diabetes, take: --metformin 1000mg  in the morning and in the evening --take Januvia 100mg  once a day --take insulin 50 units in the morning, 30 units in the evening --keep your eating schedule regular throughout the day --I will refer you to our diabetes coordinator to have more closer monitoring of your sugar so we can make appropriate medication changes.

## 2016-06-26 ENCOUNTER — Encounter: Payer: Self-pay | Admitting: Internal Medicine

## 2016-06-26 DIAGNOSIS — H2011 Chronic iridocyclitis, right eye: Secondary | ICD-10-CM | POA: Insufficient documentation

## 2016-06-26 NOTE — Assessment & Plan Note (Signed)
Patient with continued mild LE edema. She has not been using compression stockings yet.  Plan: --stable, likely 2/2 venous insufficiency --recommended they pick up compression stockings at the pharmacy

## 2016-06-26 NOTE — Assessment & Plan Note (Addendum)
Patient with poorly controlled DM, last A1c of 12.2 in October. Patient on metformin 1000mg  BID - only taking once daily, Januvia and Humalin 50U qAM, 40U qPM; she was started on glipizide in January but has not been taking it. Patient has forgotten her meter today. Son states that she has had several low CBGs which are occurring mainly in the morning and are symptomatic. He states that she does not have a regular eating schedule and mainly just snacks throughout the day.  Patient states that she is able to see adequately to change dosage of NPH. We discussed the need for regularly scheduled meals throughout the day vs snacking.   Plan: --instructed on metformin use BID --Januvia 100mg  daily --decreased Humulin to 50U qAM and 30U qPM --d/c glipizide --A1c today 10.2 --Following her cataract surgery, her son will be helping her with med and insulin administration; pharmacy has instructed him in proper administration of NPH, and I have provided a concise list of DM meds. --f/u in 1 month with glucometer

## 2016-06-26 NOTE — Assessment & Plan Note (Addendum)
Patient will be undergoing right cataract surgery next week. 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 RPR was positive at 1:1, however TP-PA was negative meaning RPR was likely false positive (ID weighted in). She had positive ACE (80), neg ANCA screen, neg proteinase 3 ab, neg ANA, positive RA titer (20), neg CCP, increased total complement with mild inc C4, mildly elevated Beta 2 globulin at 0.5 (ref 0.1-0.4) otherwise normal SPEP; lyme, toxoplasmosis, quantiferron negative.   Patient on Folic acid, MTX; atropine os, PF os, cosort os per ophto at [facility]

## 2016-06-27 NOTE — Progress Notes (Signed)
Case discussed with Dr. Svalina at the time of the visit.  We reviewed the resident's history and exam and pertinent patient test results.  I agree with the assessment, diagnosis and plan of care documented in the resident's note. 

## 2016-07-12 ENCOUNTER — Ambulatory Visit: Payer: Medicaid Other

## 2016-07-12 ENCOUNTER — Telehealth: Payer: Self-pay | Admitting: Internal Medicine

## 2016-07-12 NOTE — Telephone Encounter (Signed)
APT. REMINDER CALL, LMTCB °

## 2016-07-15 ENCOUNTER — Ambulatory Visit: Payer: Medicaid Other | Admitting: Pharmacist

## 2016-07-15 ENCOUNTER — Telehealth: Payer: Self-pay | Admitting: Internal Medicine

## 2016-07-15 DIAGNOSIS — E113219 Type 2 diabetes mellitus with mild nonproliferative diabetic retinopathy with macular edema, unspecified eye: Secondary | ICD-10-CM

## 2016-07-15 DIAGNOSIS — Z794 Long term (current) use of insulin: Secondary | ICD-10-CM

## 2016-07-15 DIAGNOSIS — Z5181 Encounter for therapeutic drug level monitoring: Secondary | ICD-10-CM

## 2016-07-15 NOTE — Progress Notes (Signed)
S: Jennifer [name] is a 46 y.o. female reports to clinical pharmacist appointment for medication review. Patient did  bring medication bottles. Patient is accompanied by family, who assist at home with medication management.  No Known Allergies Prior to Admission medications   Medication Sig Start Date End Date Taking? Authorizing Provider  atorvastatin (LIPITOR) 40 MG tablet Take 1 tablet (40 mg total) by mouth daily. 02/09/16  Yes Alphonzo Grieve, MD  atropine 1 % ophthalmic solution Place 1 drop into both eyes daily.    Yes Historical Provider, MD  dorzolamide-timolol (COSOPT) 22.3-6.8 MG/ML ophthalmic solution Place 1 drop into the left eye 2 (two) times daily.   Yes Historical Provider, MD  folic acid (FOLVITE) 1 MG tablet Take 1 mg by mouth. 04/23/16  Yes Historical Provider, MD  gabapentin (NEURONTIN) 400 MG capsule Take 1 capsule (400 mg total) by mouth daily. 04/01/16 04/01/17 Yes Norman Herrlich, MD  glucose blood (ACCU-CHEK SMARTVIEW) test strip CHECK BLOOD SUGAR 4 TIMES DAILY BEFORE MEALS AND AT BEDTIME 04/29/16  Yes Collier Salina, MD  Insulin NPH, Human,, Isophane, (HUMULIN N KWIKPEN) 100 UNIT/ML Kiwkpen INJECT 55 UNITS SUBCUTANEOUSLY EVERY MORNING AND 45 UNITS EVERY EVENING 04/29/16  Yes Collier Salina, MD  Insulin Pen Needle 31G X 5 MM MISC 1 Dose by Does not apply route 2 (two) times daily. 04/29/16  Yes Collier Salina, MD  ketorolac (ACULAR) 0.5 % ophthalmic solution Place 1 drop into both eyes 4 (four) times daily.   Yes Historical Provider, MD  metFORMIN (GLUCOPHAGE) 1000 MG tablet Take 1 tablet (1,000 mg total) by mouth 2 (two) times daily with a meal. 10/06/15  Yes Lucious Groves, DO  methotrexate (RHEUMATREX) 2.5 MG tablet Take 2.5 mg by mouth. 04/23/16  Yes Historical Provider, MD  prednisoLONE acetate (PRED FORTE) 1 % ophthalmic suspension Place 1 drop into the right eye 2 (two) times daily.    Yes Historical Provider, MD  sitaGLIPtin (JANUVIA) 100 MG tablet Take 1 tablet  (100 mg total) by mouth daily. 04/01/16  Yes Norman Herrlich, MD  Vitamin D, Cholecalciferol, 1000 units CAPS Take 1,000 mg by mouth daily with breakfast. 02/09/16  Yes Alphonzo Grieve, MD  ferrous sulfate 325 (65 FE) MG tablet Take 1 tablet (325 mg total) by mouth daily. Patient not taking: Reported on 07/15/2016 04/01/16 04/01/17  Norman Herrlich, MD  Lancets (ACCU-CHEK SOFT [facility]) lancets Check Blood Sugar 4 times daily before meals and at bedtime. Dx code: E11.321 04/29/16   Collier Salina, MD  vitamin C (ASCORBIC ACID) 500 MG tablet Take 500 mg by mouth daily.    Historical Provider, MD   @IMMUNIZATION @ Past Medical History:  Diagnosis Date  . Anemia, iron deficiency   . Blindness of left eye   . Decreased visual acuity    Left eye  . Diabetes mellitus without complication ([place])   . Glaucoma associated with ocular inflammation   . 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 Hair loss   . History of fracture of clavicle 05/18/2015  . Hypercholesteremia   . Hyperlipidemia   . Pap smear abnormality of cervix with LGSIL   . Routine/ritual circumcision   . Type II diabetes mellitus ([place])   . Uveitis    Social History   Social History  . Marital status: Married    Spouse name: N/A  .  Number of children: N/A  . Years of education: N/A   Social History Main Topics  . Smoking status: Never Smoker  . Smokeless tobacco: Never Used  . Alcohol use No  . Drug use: No  . Sexual activity: Not Currently   Other Topics Concern  . Not on file   Social History Narrative   ** Merged History Encounter **       Pt. plans to sue [employer] for testing supplies [phone] [name] July 28,2010 2:10PM      Pt is [place].    No family history on file.  O:    Component Value Date/Time   CHOL 207 (H) 02/24/2015 1620   HDL 69 02/24/2015 1620    TRIG 83 02/24/2015 1620   AST 15 10/17/2015 0006   ALT 13 (L) 10/17/2015 0006   NA 137 12/31/2015 0810   K 3.8 12/31/2015 0810   CL 101 12/31/2015 0810   CO2 27 10/17/2015 0006   GLUCOSE 261 (H) 12/31/2015 0810   HGBA1C 10.2 06/25/2016 1018   HGBA1C 10.3 01/31/2010 1332   BUN 10 12/31/2015 0810   CREATININE 0.50 12/31/2015 0810   CREATININE 0.63 10/28/2014 1649   CALCIUM 9.3 10/17/2015 0006   GFRNONAA >60 10/17/2015 0006   GFRNONAA >89 10/28/2014 1649   GFRAA >60 10/17/2015 0006   GFRAA >89 10/28/2014 1649   WBC 7.2 10/17/2015 0006   HGB 13.6 12/31/2015 0810   HCT 40.0 12/31/2015 0810   PLT 188 10/17/2015 0006   TSH 2.890 04/01/2016 1454   Ht Readings from Last 2 Encounters:  04/29/16 5\' 6"  (1.676 m)  04/01/16 5\' 6"  (1.676 m)   Wt Readings from Last 2 Encounters:  06/25/16 220 lb 14.4 oz (100.2 kg)  04/29/16 216 lb 1.6 oz (98 kg)   There is no height or weight on file to calculate BMI. BP Readings from Last 3 Encounters:  06/25/16 (!) 156/69  04/29/16 120/64  04/01/16 (!) 143/66     A/P: A drug regimen assessment was performed, including review of allergies, interactions, disease-state management, dosing and immunization history. Medications were reviewed with the patient, including name, instructions, indication, goals of therapy, potential side effects, importance of adherence, and safe use.  Findings/Recommendations:   Adherence, education             - Reinforced insulin education             - Patient has had blood glucose down to 60 in the mornings over the last several weeks.  Instructed patient to stop taking glipizide per note from Dr. Jari Favre  on 3/07.              - Reinforced patient to take metformin twice a day. She was still taking it once a day.   HTN/DM/Lipids/ASCVD risk or hx             - Patient has not been taking atorvastatin. Will clarify if she needs refills.   Bone health: DEXA, calcium/vitamin D            - Patient has not been taking  Vitamin D. Will clarify if she needs refills.  - Patient complaining of tingling in feet. Advised patient to try compression stockings and to speak with Dr. Jari Favre about increasing gabapentin dosage.   - Provided patient with a pillbox to try and help with compliance.    An after visit summary was provided and patient advised to follow up in 1 day  or sooner if any changes in condition or questions regarding medications arise.   The patient verbalized understanding of information provided by repeating back concepts discussed.   30  minutes spent face-to-face with the patient during the encounter. 70% of time spent on education. 30% of time was spent on access to care.  Ihor Austin, PharmD PGY1 Pharmacy Resident Pager: [phone]

## 2016-07-15 NOTE — Telephone Encounter (Signed)
APT. REMINDER CALL, LMTCB °

## 2016-07-16 ENCOUNTER — Encounter: Payer: Self-pay | Admitting: Pharmacist

## 2016-07-16 ENCOUNTER — Ambulatory Visit (INDEPENDENT_AMBULATORY_CARE_PROVIDER_SITE_OTHER): Payer: Medicaid Other | Admitting: Internal Medicine

## 2016-07-16 VITALS — BP 174/81 | HR 74 | Temp 98.6°F | Ht 66.0 in | Wt 220.6 lb

## 2016-07-16 DIAGNOSIS — R202 Paresthesia of skin: Secondary | ICD-10-CM

## 2016-07-16 DIAGNOSIS — Z6835 Body mass index (BMI) 35.0-35.9, adult: Secondary | ICD-10-CM | POA: Diagnosis not present

## 2016-07-16 DIAGNOSIS — E1165 Type 2 diabetes mellitus with hyperglycemia: Secondary | ICD-10-CM | POA: Diagnosis not present

## 2016-07-16 DIAGNOSIS — E113212 Type 2 diabetes mellitus with mild nonproliferative diabetic retinopathy with macular edema, left eye: Secondary | ICD-10-CM

## 2016-07-16 DIAGNOSIS — R600 Localized edema: Secondary | ICD-10-CM

## 2016-07-16 DIAGNOSIS — E1142 Type 2 diabetes mellitus with diabetic polyneuropathy: Secondary | ICD-10-CM

## 2016-07-16 DIAGNOSIS — Z794 Long term (current) use of insulin: Secondary | ICD-10-CM | POA: Diagnosis not present

## 2016-07-16 DIAGNOSIS — E11649 Type 2 diabetes mellitus with hypoglycemia without coma: Secondary | ICD-10-CM

## 2016-07-16 DIAGNOSIS — E669 Obesity, unspecified: Secondary | ICD-10-CM | POA: Diagnosis not present

## 2016-07-16 DIAGNOSIS — E113219 Type 2 diabetes mellitus with mild nonproliferative diabetic retinopathy with macular edema, unspecified eye: Secondary | ICD-10-CM | POA: Diagnosis not present

## 2016-07-16 MED ORDER — GABAPENTIN 300 MG PO CAPS: 300.0000 mg | ORAL_CAPSULE | Freq: Every day | ORAL | 2 refills | Status: DC

## 2016-07-16 MED ORDER — ATORVASTATIN CALCIUM 40 MG PO TABS: 40.0000 mg | ORAL_TABLET | Freq: Every day | ORAL | 5 refills | Status: DC

## 2016-07-16 NOTE — Assessment & Plan Note (Addendum)
Continues to have tingling which she states was new for 2 weeks but upon chart review it seems that she was seen for this on 03/2016 when she was prescribed gabapentin 400mg  daily but she never took it as she did not know about it. B12 was normal. On foot exam, it cooler to touch but has good pulses, no wounds, no tenderness over the muscles. Given b/l distribution this is most likely from diabetic neuropathy.  - will send her gabapentin 300 qHS and asked her to start taking it this time to see if it helps.

## 2016-07-16 NOTE — Progress Notes (Signed)
   CC: tingling sensation both feet and DM II follow up   HPI:  Jennifer [name] is a 46 y.o. with PMh as listed below is here for feet tingling x2 weeks, started suddenly, no injuries, no back pain. Had hand tingling in the remote past but not now.   DM II - last visit was complaining of frequent hypoglycemias. On metformind BID, januvia 100mg  daily, humulin decreased to 45 u AM and 30 U PM, d/ced glipizide. hgba1c last visit 10.2 (down from 12.2). Her sugar has been fluctuating with hypoglycemias in 60's and hyper in 400's, has been only checking in the mornings. She eats her largest meal at 2 pm but takes her insulin at 8 am and 8 pm .    Past Medical History:  Diagnosis Date  . Anemia, iron deficiency   . Blindness of left eye   . Decreased visual acuity    Left eye  . Diabetes mellitus without complication ([place])   . Glaucoma associated with ocular inflammation   . 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 Hair loss   . History of fracture of clavicle 05/18/2015  . Hypercholesteremia   . Hyperlipidemia   . Pap smear abnormality of cervix with LGSIL   . Routine/ritual circumcision   . Type II diabetes mellitus ([place])   . Uveitis     Review of Systems:    Review of Systems  Constitutional: Negative for chills and fever.  Respiratory: Negative for cough.   Cardiovascular: Negative for chest pain and palpitations.  Gastrointestinal: Negative for heartburn, nausea and vomiting.  Neurological: Positive for tingling. Negative for dizziness.     Physical Exam:  Vitals:   07/16/16 1519  BP: (!) 174/81  Pulse: 74  Temp: 98.6 F (37 C)  TempSrc: Oral  SpO2: 99%  Weight: 220 lb 9.6 oz (100.1 kg)  Height: 5\' 6"  (1.676 m)   Physical Exam  Constitutional: She is oriented to person, place, and time. She appears well-developed and well-nourished.    Overweight female. Accompanied by his son who helped translate.   Cardiovascular: Normal rate and regular rhythm.  Exam reveals no gallop and no friction rub.   No murmur heard. Respiratory: Effort normal and breath sounds normal.  Musculoskeletal:  2+ pitting edema upto her shins.  2+ pulses b/l on lower exts. Feet cooler to touch but normal skin color. No wounds.  Neurological: She is alert and oriented to person, place, and time.    Assessment & Plan:   See Encounters Tab for problem based charting.  Patient discussed with Dr. Lynnae January

## 2016-07-16 NOTE — Patient Instructions (Addendum)
Take metformin and Januvia the same dose.  Take NPH insulin 40 units in the morning and 25 units at night.  Follow up in 1 week.  Take Gabapentin at night time for the leg tingling.

## 2016-07-16 NOTE — Addendum Note (Signed)
Addended by: Forde Dandy on: 07/16/2016 06:32 PM   Modules accepted: Orders

## 2016-07-16 NOTE — Assessment & Plan Note (Signed)
Likely from venous insufficiency or obesity. No SOB to suggest CHF.  - ordered compression socks (knee high) from our clinic.

## 2016-07-16 NOTE — Addendum Note (Signed)
Addended by: [name] A on: 07/16/2016 10:14 AM   Modules accepted: Orders

## 2016-07-16 NOTE — Progress Notes (Signed)
Freestyle Libre Pro CGM sensor placed and started. Patient was educated about wearing sensor, keeping food, activity and medication log and when to call office. Follow up was arranged with the patient.   

## 2016-07-16 NOTE — Assessment & Plan Note (Signed)
Her diabetes remains uncontrolled with hypoglycemias and hyperglycemias. Currently on metformin 1000 BID, januvia 100mg  daily, humulin decreased to 45 u 8 am and 30 u 8 pm. Hard to make adjustment due to both extremes of numbers and just one dataset from the mornings. She may have hyperglycemias in the morning due to noctural hypoglycemias from taking her NPH at 8 pm when she does not really eat anything heavy for dinner.   -We discussed continuous glucose monitor placement. We will place it today. She will come back in 1 week for follow up.  - I prioritized avoiding hypoglycemias for today so I will reduce her insulin to 40 units in morning and 25 units at night.  - f/up in 1 week for further adjustments.

## 2016-07-17 NOTE — Progress Notes (Signed)
Internal Medicine Clinic Attending  Case discussed with Dr. Ahmed at the time of the visit.  We reviewed the resident's history and exam and pertinent patient test results.  I agree with the assessment, diagnosis, and plan of care documented in the resident's note. 

## 2016-07-22 ENCOUNTER — Telehealth: Payer: Self-pay | Admitting: Internal Medicine

## 2016-07-22 NOTE — Telephone Encounter (Signed)
APT. REMINDER CALL, LMTCB °

## 2016-07-23 ENCOUNTER — Ambulatory Visit (INDEPENDENT_AMBULATORY_CARE_PROVIDER_SITE_OTHER): Payer: Medicaid Other | Admitting: Internal Medicine

## 2016-07-23 ENCOUNTER — Ambulatory Visit (INDEPENDENT_AMBULATORY_CARE_PROVIDER_SITE_OTHER): Payer: Medicaid Other | Admitting: Dietician

## 2016-07-23 VITALS — BP 156/65 | HR 82 | Temp 98.3°F | Ht 66.0 in | Wt 221.5 lb

## 2016-07-23 DIAGNOSIS — H5462 Unqualified visual loss, left eye, normal vision right eye: Secondary | ICD-10-CM

## 2016-07-23 DIAGNOSIS — E113212 Type 2 diabetes mellitus with mild nonproliferative diabetic retinopathy with macular edema, left eye: Secondary | ICD-10-CM

## 2016-07-23 DIAGNOSIS — Z79899 Other long term (current) drug therapy: Secondary | ICD-10-CM

## 2016-07-23 DIAGNOSIS — Z794 Long term (current) use of insulin: Secondary | ICD-10-CM

## 2016-07-23 DIAGNOSIS — E1159 Type 2 diabetes mellitus with other circulatory complications: Secondary | ICD-10-CM

## 2016-07-23 DIAGNOSIS — R030 Elevated blood-pressure reading, without diagnosis of hypertension: Secondary | ICD-10-CM | POA: Diagnosis not present

## 2016-07-23 DIAGNOSIS — Z713 Dietary counseling and surveillance: Secondary | ICD-10-CM | POA: Diagnosis not present

## 2016-07-23 DIAGNOSIS — E1140 Type 2 diabetes mellitus with diabetic neuropathy, unspecified: Secondary | ICD-10-CM

## 2016-07-23 DIAGNOSIS — D509 Iron deficiency anemia, unspecified: Secondary | ICD-10-CM | POA: Diagnosis not present

## 2016-07-23 DIAGNOSIS — E1165 Type 2 diabetes mellitus with hyperglycemia: Secondary | ICD-10-CM | POA: Diagnosis present

## 2016-07-23 DIAGNOSIS — E559 Vitamin D deficiency, unspecified: Secondary | ICD-10-CM

## 2016-07-23 DIAGNOSIS — I10 Essential (primary) hypertension: Secondary | ICD-10-CM

## 2016-07-23 DIAGNOSIS — E118 Type 2 diabetes mellitus with unspecified complications: Secondary | ICD-10-CM

## 2016-07-23 DIAGNOSIS — I152 Hypertension secondary to endocrine disorders: Secondary | ICD-10-CM

## 2016-07-23 DIAGNOSIS — E1142 Type 2 diabetes mellitus with diabetic polyneuropathy: Secondary | ICD-10-CM

## 2016-07-23 HISTORY — DX: Hypertension secondary to endocrine disorders: I15.2

## 2016-07-23 HISTORY — DX: Type 2 diabetes mellitus with other circulatory complications: E11.59

## 2016-07-23 MED ORDER — FERROUS SULFATE 325 (65 FE) MG PO TABS: 325.0000 mg | ORAL_TABLET | Freq: Every day | ORAL | 3 refills | Status: DC

## 2016-07-23 NOTE — Assessment & Plan Note (Addendum)
HPI: Hx of chronic iron deficiency attributed as most likely due to menorrhagia as she is having periods still. Ferritin 40 last year on supplementation. She is out of prescribed ferrous sulfate tabs.  A: Iron deficiency anemia controlled on 325mg  daily Menstruation is the most likely cause here. Some other possibilities would be a thalassemia trait or her chronic methotrexate but these would not explain a low ferritin and iron saturation with microcytosis. No history of dark or blood stools.  P: Reordered ferrous sulfate This does not need repeat investigation yet with last labs in 01/2016

## 2016-07-23 NOTE — Progress Notes (Signed)
Case discussed with Dr. Rice at the time of the visit.  We reviewed the resident's history and exam and pertinent patient test results.  I agree with the assessment, diagnosis, and plan of care documented in the resident's note. 

## 2016-07-23 NOTE — Progress Notes (Signed)
   CC: Follow up for diabetes  HPI:  Jennifer [name] is a 46 y.o. woman here one week after placement of continuous glucose monitoring to follow up management of her type 2 diabetes.   See problem based assessment and plan below for additional details  Past Medical History:  Diagnosis Date  . Anemia, iron deficiency   . Blindness of left eye   . Decreased visual acuity    Left eye  . Diabetes mellitus without complication ([place])   . Glaucoma associated with ocular inflammation   . 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 Hair loss   . History of fracture of clavicle 05/18/2015  . Hypercholesteremia   . Hyperlipidemia   . Pap smear abnormality of cervix with LGSIL   . Routine/ritual circumcision   . Type II diabetes mellitus ([place])   . Uveitis     Review of Systems:  Review of Systems  Constitutional: Positive for malaise/fatigue.  Respiratory: Negative for shortness of breath.   Cardiovascular: Positive for leg swelling. Negative for claudication and PND.  Gastrointestinal: Negative for nausea.  Genitourinary: Negative for frequency.  Skin: Negative for itching.  Endo/Heme/Allergies: Negative for polydipsia.    Physical Exam: Physical Exam  Constitutional: She is well-developed, well-nourished, and in no distress.  HENT:  Mouth/Throat: Oropharynx is clear and moist.  Eyes:  Left eye blindness, wearing sunglasses for light sensitivity  Cardiovascular: Normal rate and regular rhythm.   Pulmonary/Chest: Effort normal and breath sounds normal.  Musculoskeletal: Normal range of motion.  1+ pitting edema of bilateral lower extremities Skin intact on both feet without any callus  Skin: Skin is warm and dry.  Psychiatric: Affect normal.    Vitals:   07/23/16 1357  BP: (!) 156/65  Pulse: 82  Temp: 98.3 F (36.8 C)  TempSrc: Oral    SpO2: 99%  Weight: 221 lb 8 oz (100.5 kg)  Height: 5\' 6"  (1.676 m)    Assessment & Plan:   See Encounters Tab for problem based charting.  Patient discussed with Dr. Eppie Gibson

## 2016-07-23 NOTE — Assessment & Plan Note (Addendum)
HPI: She had been taking gabapentin 400mg  daily for mild diabetic neuropathy in both feet but reports this is not bothering her much. She thinks this is improved since reducing her leg swelling by wearing compression socks. As a result she is not taking the gabapentin currently and asks if this is a needed medicine.  A: Mild neuropathy without associated foot injuries or PRN medications for pain It is possible some of this pain was being driven by chronic venous insufficiency rather than neuropathy if compression is helping. She is also continuing to take treatment for her vitamin D deficiency that might improve neuropathy symptoms.  P: I recommended stopping gabapentin for now due to ongoing fatigue and minimal pain symptoms. This can be resumed in the future if she has problems again.

## 2016-07-23 NOTE — Assessment & Plan Note (Signed)
HPI: She was seen one week ago with slight decreases to BID NPH 45->40U qAM and 30->25U qPM recommended at that time. Continuous glucose monitor was placed to get a better assessment of her CBG values since there is high variability both between days and time of day. Unfortunately the device needle was not placed properly or became quickly dislodged since it was bent on visual inspection today and the data is not available. She has had 2-3 episodes of hypoglycemia with CBG readings down to 60 with some associated lightheadedness during the past week.  A: Uncontrolled type 2 diabetes with last Hgb A1c 10.6% on 06/25/16 She is having symptoms of hypoglycemia infrequently and mostly remaining very elevated She is continuing to take 45U qAM and 30U qPM at this time due to elevated blood sugars  P: Replaced new continuous glucose monitor to follow up in one week. Continue current medication doses for the next week We can consider changing to long acting metformin if she is having trouble taking 1000mg  twice daily at the next visit

## 2016-07-23 NOTE — Progress Notes (Signed)
Diabetes Self-Management Education  Visit Type:  Follow-up  Appt. Start Time: 0865 Appt. End Time: 7846  07/23/2016  [name], identified by name and date of birth, is a 46 y.o. female with a diagnosis of Diabetes:  .   ASSESSMENT  Jennifer [name] came for CGM download with her son today who translates. Her sensor malfunctioned and did not collect data. She reports taking 45 units of NPH in the morning at 9 AM and 30 units of NPH at 9 PM. She takes glipizide and metformin one time a day in the morning. She says she cannot take the metformin twice a day as it upsets her stomach. Her diet consists of the same 4-5 foods in unknown portions about two times a day. Her tea and whole milk mixture is ~ 12 oz 3x/day.          Diabetes Self-Management Education - 07/23/16 1400      Health Coping   How would you rate your overall health? Good     Psychosocial Assessment   Patient Belief/Attitude about Diabetes Motivated to manage diabetes   Self-care barriers English as a second language   Self-management support Doctor's office;Family;CDE visits   Patient Concerns Glycemic Control   Special Needs Verbal instruction   Preferred Learning Style Auditory   Learning Readiness Ready     Pre-Education Assessment   Patient understands incorporating nutritional management into lifestyle. Needs Review   Patient understands using medications safely. Needs Review   Patient understands monitoring blood glucose, interpreting and using results Needs Review     Complications   Last HgB A1C per patient/outside source 10.2 %   How often do you check your blood sugar? 1-2 times/day   Fasting Blood glucose range (mg/dL) <70;>200   Postprandial Blood glucose range (mg/dL) >200   Number of hypoglycemic episodes per month --  3   Number of hyperglycemic episodes per week 5   Can you tell when your blood sugar is high? Yes   What do you do if your blood sugar is high? take medicine   Have you had a  dilated eye exam in the past 12 months? Yes   Are you checking your feet? Yes     Dietary Intake   Breakfast milk, bread, tea, potatoes   Lunch milk,bread, tea potatoes   Dinner whole milk and tea   Beverage(s) water, whole milk and tea     Exercise   Exercise Type ADL's     Patient Education   Previous Diabetes Education Yes (please comment)   Nutrition management  Food label reading, portion sizes and measuring food.   Medications Taught/reviewed insulin injection, site rotation, insulin storage and needle disposal.;Reviewed medication adjustment guidelines for hyperglycemia and sick days.   Monitoring Taught/discussed recording of test results and interpretation of SMBG.     Individualized Goals (developed by patient)   Medications Other (comment)     Outcomes   Program Status Not Completed     Subsequent Visit   Since your last visit have you continued or begun to take your medications as prescribed? Yes   Since your last visit have you had your blood pressure checked? No   Since your last visit have you experienced any weight changes? No change   Since your last visit, are you checking your blood glucose at least once a day? Yes      Learning Objective:  Patient will have a greater understanding of diabetes self-management. Patient education plan is  to attend individual and/or group sessions per assessed needs and concerns.  My plan to support myself in continuing these changes to care for my diabetes is to attend or contact:   Family and doctor and CDE are her supports for her diabetes self care  Plan:   Patient Instructions  You are doing a good job of taking your insulin on time and consistently.   You are doing a good job taking your blood sugar and bringing your meter, and logbook to the office!   Please continue to record your food and medicine intake for the next week.  Inject your morning insulin in your thighs/legs.  Inject your evening insulin in your  upper buttocks.      Expected Outcomes:  Demonstrated interest in learning. Expect positive outcomes  Education material provided: food and activity log and injection site handout  If problems or questions, patient to contact team via:  Phone  Future DSME appointment: - 2 wks1 week [name], Jennifer [name], [place] 07/23/2016 3:12 PM.

## 2016-07-23 NOTE — Assessment & Plan Note (Signed)
She has several elevated blood pressure readings over her past visits this year. These range from systolic blood pressure in 120s to 190s without any consistent trend. She has never been on medication for this. It is unclear if there is true hypertension due to inconsistent readings. IF this is the case she would benefit from tight control considering her known microvascular complications of diabetes already. She does not want to start new medicines if possible so it is reasonable to follow this for now.

## 2016-07-23 NOTE — Patient Instructions (Addendum)
It was a pleasure to see you today Ms. Golliday.  Unfortunately we do not have new data from the glucose monitor. We will need to see you again in one week to recheck this. For now continue your current diabetes medications.  I have refilled your prescription for iron supplements. Keep taking 1 pill (325mg ) daily for this.  I am glad your leg pain is not worsening. You do not need to take the gabapentin if the leg pain is improved on its own or with using the compression socks.

## 2016-07-23 NOTE — Patient Instructions (Signed)
You are doing a good job of taking your insulin on time and consistently.   You are doing a good job taking your blood sugar and bringing your meter, and logbook to the office!   Please continue to record your food and medicine intake for the next week.  Inject your morning insulin in your thighs/legs.  Inject your evening insulin in your upper buttocks.

## 2016-07-30 ENCOUNTER — Ambulatory Visit (INDEPENDENT_AMBULATORY_CARE_PROVIDER_SITE_OTHER): Payer: Medicaid Other | Admitting: Dietician

## 2016-07-30 DIAGNOSIS — E1165 Type 2 diabetes mellitus with hyperglycemia: Secondary | ICD-10-CM | POA: Diagnosis not present

## 2016-07-30 DIAGNOSIS — Z794 Long term (current) use of insulin: Secondary | ICD-10-CM | POA: Diagnosis not present

## 2016-07-30 DIAGNOSIS — Z713 Dietary counseling and surveillance: Secondary | ICD-10-CM | POA: Diagnosis not present

## 2016-07-30 DIAGNOSIS — E113212 Type 2 diabetes mellitus with mild nonproliferative diabetic retinopathy with macular edema, left eye: Secondary | ICD-10-CM

## 2016-07-30 DIAGNOSIS — Z6835 Body mass index (BMI) 35.0-35.9, adult: Secondary | ICD-10-CM

## 2016-07-30 LAB — GLUCOSE, CAPILLARY: GLUCOSE-CAPILLARY: 347 mg/dL — AB (ref 65–99)

## 2016-07-30 NOTE — Progress Notes (Signed)
Diabetes Self-Management Education  Visit Type:  Follow-up  Appt. Start Time: 1400 Appt. End Time: 1500  07/30/2016  [name], identified by name and date of birth, is a 46 y.o. female with a diagnosis of Diabetes:type 2 diabetes  .   ASSESSMENT She is here with her son Jennifer [name]. Her CGM sensor was downloaded. It only recorded for 5 days ( 3 full days and two half days). She wore it from Tuesday April 3 to Saturday April 7th, when it  became dislodged about 5:30-6 PM. This is the second time a sensor became dislodged for her.   Her average was 262, no estimated A1C, 2% belwo 70 mg/dl, 16% between 70-180 and 82% of readings above 180 mg/dl, 62% above 250 mg/dl.  She takes her insulin 45 units about 8-830 AM and her PM dose ~ 10 PM when she eats her PM meal.   She had an extreme dip in her blood sugars 3/4 days from 4 Am until 9 AM. Her blood sugars are high often in the daytime and late at night after her PM meal at 10 PM.   She does not take her insulin when her blood sugar is too low.  She did not bring her meter or records, and does not remember when she took insulin or not.  Her son asks about purchasig a [employer]. He is informed her insurance does not cover the cost of it at this time and he can read more about it online. Web site showed to him.   Weight 221 lb 9.6 oz (100.5 kg). Body mass index is 35.77 kg/m.       Diabetes Self-Management Education - 07/30/16 1600      Health Coping   How would you rate your overall health? Good     Psychosocial Assessment   Patient Belief/Attitude about Diabetes (P)  Motivated to manage diabetes   Self-care barriers (P)  English as a second language;Lack of material resources   Patient Concerns (P)  Glycemic Control   Special Needs (P)  Verbal instruction   Preferred Learning Style (P)  Auditory   Learning Readiness (P)  Ready     Complications   Fasting Blood glucose range (mg/dL) (P)  <70;>200   Number of  hypoglycemic episodes per month (P)  --  2 fasting ~ 7-930 am   Number of hyperglycemic episodes per week (P)  5   Can you tell when your blood sugar is high? (P)  No   What do you do if your blood sugar is high? (P)  take medicine   Have you had a dilated eye exam in the past 12 months? (P)  Yes   Are you checking your feet? (P)  Yes     Subsequent Visit   Since your last visit have you continued or begun to take your medications as prescribed? No  does not take when blood sugar is 'low'   Since your last visit have you had your blood pressure checked? No   Since your last visit have you experienced any weight changes? (P)  No change   Since your last visit, are you checking your blood glucose at least once a day? (P)  Yes  did not bring her meter      Learning Objective:  Patient will have a greater understanding of diabetes self-management. Patient education plan is to attend individual and/or group sessions per assessed needs and concerns. My plan to support myself in continuing these  changes to care for my diabetes is to attend or contact:   ? Attend visits with -doctor's office, CDE, Dietitian, pharmacist, church  Plan:   Patient Instructions  Our plan to help with Ms. Rising's blood sugar control  1- Take am insulin dose ( 45 units)  at 6 AM 2- Take PM does (30 units) at 6 PM 3- If low- drink 4-6 ounces juice, recheck blood sugar in 15-20 minutes, if  More than 80,   take usual dose of insulin.    Please bring your meter back and try to check your blood sugar before around noon and at bedtime as well as first thing ion the morning.   Expected Outcomes:    improved taking of insulin Education material provided: A1C conversion sheet  If problems or questions, patient to contact team via:  Phone  Future DSME appointment: -  3-4 weeks [place], [place] 07/30/2016 4:47 PM.

## 2016-07-30 NOTE — Patient Instructions (Signed)
Our plan to help with Jennifer [name]'s blood sugar control  1- Take am insulin dose ( 45 units)  at 6 AM 2- Take PM does (30 units) at 6 PM 3- If low- drink 4-6 ounces juice, recheck blood sugar in 15-20 minutes, if  More than 80,   take usual dose of insulin.    Please bring your meter back and try to check your blood sugar before around noon and at bedtime as well as first thing ion the morning.

## 2016-08-21 ENCOUNTER — Ambulatory Visit: Payer: Medicaid Other | Admitting: Dietician

## 2016-09-23 ENCOUNTER — Encounter: Payer: Medicaid Other | Admitting: Podiatry

## 2016-09-24 NOTE — Progress Notes (Signed)
CC: diabetes, foot pain  HPI:  [name] is a 46 y.o. with a PMH of T2DM, iron deficiency anemia, HLD presenting to clinic for follow up on her diabetes and foot pain. She is accompanied by her son who is providing interpretation.  T2DM: Patient with poorly controlled insulin dependent T2DM. She is supposed to be on Humulin 45U qAM, 30U qPM. Metformin 1000mg  BID, januvia 100mg  daily, and glipizide 5mg  daily, however she reports taking higher doses of insulin (though seemed uncertain) and not taking Januvia and never starting it. She reports checking her CBGs regularly up until a couple of days ago when her meter stopped working; prior to that she reports one episode of hypoglycemia, otherwise reports CBGs from 140s to 240s.   Foot pain: Patient complains of bil leg swelling and discomfort. She was seen previously; echo was unremarkable. At first she described neuropathic symptoms so was started on gabapentin - since then has been discontinued as she stated resolution of pain. However apparently had continued discomfort which she now describes without tingling, pins/needles, no aching but just tightness. She was previously recommended to use compression stockings and did for a short period of time but stopped for unknown reason - they seemed to improve her discomfort.    Please see problem based Assessment and Plan for status of patients chronic conditions.  Past Medical History:  Diagnosis Date  . Anemia, iron deficiency   . Blindness of left eye   . Decreased visual acuity    Left eye  . 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 Hair loss   . History of fracture of clavicle 05/18/2015  . Hyperlipidemia   . Pap smear abnormality of cervix with LGSIL   . Routine/ritual circumcision   . Type II diabetes mellitus ([place])   . Uveitis     Review of  Systems:   Review of Systems  Constitutional: Negative for chills, diaphoresis and fever.  HENT: Negative for hearing loss.   Eyes: Positive for blurred vision and photophobia. Negative for pain.  Respiratory: Negative for cough and shortness of breath.   Cardiovascular: Positive for leg swelling. Negative for chest pain and palpitations.  Gastrointestinal: Negative for abdominal pain, constipation, diarrhea, nausea and vomiting.  Musculoskeletal: Negative for falls and joint pain.  Neurological: Negative for dizziness, tingling, sensory change, focal weakness, weakness and headaches.    Physical Exam:  Vitals:   09/25/16 1351  BP: (!) 142/57  Pulse: 80  Temp: 98.5 F (36.9 C)  TempSrc: Oral  SpO2: 100%  Weight: 219 lb 4.8 oz (99.5 kg)  Height: 5\' 6"  (1.676 m)   Physical Exam  Constitutional: She is oriented to person, place, and time. She appears well-developed and well-nourished. No distress.  HENT:  Head: Normocephalic and atraumatic.  Neck: Normal range of motion.  Cardiovascular: Normal rate, regular rhythm, normal heart sounds and intact distal pulses.  Exam reveals no gallop and no friction rub.   No murmur heard. Pulmonary/Chest: Effort normal and breath sounds normal. She has no wheezes. She has no rales.  Abdominal: Soft. Bowel sounds are normal. She exhibits no distension. There is no tenderness.  Musculoskeletal: Normal range of motion. She exhibits edema (minimal pitting edema). She exhibits no tenderness.  Neurological: She is alert and oriented to person, place, and time.  Skin: Skin is warm and dry. Capillary refill takes less than  2 seconds. No rash noted. She is not diaphoretic. No erythema. No pallor.  Psychiatric: Judgment and thought content normal.    Assessment & Plan:   See Encounters Tab for problem based charting.   Patient discussed with Dr. Gerrit Friends, MD Internal Medicine PGY1

## 2016-09-25 ENCOUNTER — Ambulatory Visit (INDEPENDENT_AMBULATORY_CARE_PROVIDER_SITE_OTHER): Payer: Medicaid Other | Admitting: Internal Medicine

## 2016-09-25 ENCOUNTER — Encounter: Payer: Self-pay | Admitting: Internal Medicine

## 2016-09-25 VITALS — BP 142/57 | HR 80 | Temp 98.5°F | Ht 66.0 in | Wt 219.3 lb

## 2016-09-25 DIAGNOSIS — E785 Hyperlipidemia, unspecified: Secondary | ICD-10-CM

## 2016-09-25 DIAGNOSIS — R600 Localized edema: Secondary | ICD-10-CM | POA: Diagnosis not present

## 2016-09-25 DIAGNOSIS — E782 Mixed hyperlipidemia: Secondary | ICD-10-CM

## 2016-09-25 DIAGNOSIS — E113219 Type 2 diabetes mellitus with mild nonproliferative diabetic retinopathy with macular edema, unspecified eye: Secondary | ICD-10-CM | POA: Diagnosis not present

## 2016-09-25 DIAGNOSIS — Z794 Long term (current) use of insulin: Secondary | ICD-10-CM

## 2016-09-25 DIAGNOSIS — E113212 Type 2 diabetes mellitus with mild nonproliferative diabetic retinopathy with macular edema, left eye: Secondary | ICD-10-CM

## 2016-09-25 LAB — POCT GLYCOSYLATED HEMOGLOBIN (HGB A1C): Hemoglobin A1C: 12.6

## 2016-09-25 LAB — GLUCOSE, CAPILLARY: Glucose-Capillary: 429 mg/dL — ABNORMAL HIGH (ref 65–99)

## 2016-09-25 MED ORDER — GLIPIZIDE 5 MG PO TABS: 5.0000 mg | ORAL_TABLET | Freq: Two times a day (BID) | ORAL | 3 refills | Status: DC

## 2016-09-25 MED ORDER — INSULIN ISOPHANE HUMAN 100 UNIT/ML KWIKPEN: PEN_INJECTOR | SUBCUTANEOUS | 2 refills | Status: DC

## 2016-09-25 NOTE — Assessment & Plan Note (Addendum)
Patient complains of bil leg swelling and discomfort. She was seen previously; echo was unremarkable. At first she described neuropathic symptoms so was started on gabapentin - since then has been discontinued as she stated resolution of pain. However apparently had continued discomfort which she now describes without tingling, pins/needles, no aching but just tightness. She was previously recommended to use compression stockings and did for a short period of time but stopped for unknown reason - they seemed to improve her discomfort.    Her symptoms likely from venous insufficiency.   Plan: --advised again to use compression stockings --advised increased activity

## 2016-09-25 NOTE — Assessment & Plan Note (Addendum)
Patient with poorly controlled insulin dependent T2DM. She is supposed to be on Humulin 45U qAM, 30U qPM. Metformin 1000mg  BID, januvia 100mg  daily, and glipizide 5mg  daily, however she reports taking higher doses of insulin (though seemed uncertain) and not taking Januvia and never starting it. She reports checking her CBGs regularly up until a couple of days ago when her meter stopped working; prior to that she reports one episode of hypoglycemia, otherwise reports CBGs from 140s to 240s.  Plan: --A1c - worsened to 12.6 --advised to take the lower recommended dose of humulin 45 units qAM and 30 units qPM due to inconsistent meals and documented overnight hypoglycemia with CGM --continue metformin 1000mg  BID --increase glipizide to 5mg  BID --diabetes coordinator working on getting personal freestyle libre --advised to get new battery and continue monitoring CBGs regularly --foot exam performed today; f/u urine microalb --f/u in one month

## 2016-09-25 NOTE — Assessment & Plan Note (Signed)
Checking lipid panel today. Supposed to be on atorvastatin 40mg  daily.

## 2016-09-25 NOTE — Patient Instructions (Addendum)
For your diabetes:  Take Humulin 45 units with breakfast and 30 units with dinner. Continue metformin one pill with breakfast and one pill with dinner. Take glipizide one pill with breakfast and one pill with dinner. Your meter needs a new battery. Please check your sugar 3-4 times a day and bring in your meter with you to your appointments.   For your feet, wear compression stockings daily to help with the swelling and discomfort.   We will see you in one month

## 2016-09-26 LAB — LIPID PANEL
CHOL/HDL RATIO: 3.1 ratio (ref 0.0–4.4)
Cholesterol, Total: 178 mg/dL (ref 100–199)
HDL: 58 mg/dL (ref >39)
LDL CALC: 99 mg/dL (ref 0–99)
Triglycerides: 105 mg/dL (ref 0–149)
VLDL Cholesterol Cal: 21 mg/dL (ref 5–40)

## 2016-09-26 LAB — MICROALBUMIN / CREATININE URINE RATIO
Creatinine, Urine: 27.6 mg/dL
MICROALB/CREAT RATIO: 26.1 mg/g{creat} (ref 0.0–30.0)
MICROALBUM., U, RANDOM: 7.2 ug/mL

## 2016-09-26 NOTE — Progress Notes (Signed)
Internal Medicine Clinic Attending  Case discussed with Dr. Jari Favre at the time of the visit.  We reviewed the resident's history and exam and pertinent patient test results.  I agree with the assessment, diagnosis, and plan of care documented in the resident's note.  This patient is challenging as communication and understanding of the plan has been an issue. I agree with Dr. Winfred Burn plan and recommend frequent follow up for continued education and medication monitoring.

## 2016-10-03 ENCOUNTER — Ambulatory Visit: Payer: Medicaid Other | Admitting: Podiatry

## 2016-10-07 ENCOUNTER — Ambulatory Visit (INDEPENDENT_AMBULATORY_CARE_PROVIDER_SITE_OTHER): Payer: Medicaid Other

## 2016-10-07 ENCOUNTER — Ambulatory Visit (INDEPENDENT_AMBULATORY_CARE_PROVIDER_SITE_OTHER): Payer: Medicaid Other | Admitting: Podiatry

## 2016-10-07 ENCOUNTER — Encounter: Payer: Self-pay | Admitting: Podiatry

## 2016-10-07 DIAGNOSIS — M79672 Pain in left foot: Secondary | ICD-10-CM | POA: Diagnosis not present

## 2016-10-07 DIAGNOSIS — M79671 Pain in right foot: Secondary | ICD-10-CM | POA: Diagnosis not present

## 2016-10-07 DIAGNOSIS — M25571 Pain in right ankle and joints of right foot: Secondary | ICD-10-CM

## 2016-10-07 DIAGNOSIS — M7750 Other enthesopathy of unspecified foot: Secondary | ICD-10-CM | POA: Diagnosis not present

## 2016-10-07 DIAGNOSIS — M25572 Pain in left ankle and joints of left foot: Principal | ICD-10-CM

## 2016-10-07 DIAGNOSIS — M779 Enthesopathy, unspecified: Secondary | ICD-10-CM

## 2016-10-07 MED ORDER — TRIAMCINOLONE ACETONIDE 10 MG/ML IJ SUSP
10.0000 mg | Freq: Once | INTRAMUSCULAR | Status: AC
  Administered 2016-10-07: 10 mg

## 2016-10-09 NOTE — Progress Notes (Signed)
Subjective:    [name], female   DOB: 46 y.o.   MRN: [id number]   HPI patient presents with quite a bit of pain and swelling on the inside of the ankle region bilateral and mild forefoot pain that occurs occasionally with long-term history of diabetes    Review of Systems  All other systems reviewed and are negative.       Objective:  Physical Exam  Constitutional: She is oriented to person, place, and time.  Cardiovascular: Intact distal pulses.   Musculoskeletal: Normal range of motion.  Neurological: She is alert and oriented to person, place, and time.  Skin: Skin is warm and dry.  Nursing note and vitals reviewed.  neurovascular status was found to be intact with mild diminishment of sharp [employer] vibratory. Patient's found to have moderate depression of the arch bilateral with inflammation of the posterior tibial tendon bilateral as it comes underneath the medial malleolus and mild discomfort at its insertion into the navicular. Forefoot pain that's nondescript and may be related to neuropathic like symptoms     Assessment:    Tendinitis which appears to be due to probable change in arch height     Plan:    H&P conditions reviewed at great length. At this point I recommend a careful injection treatment and I did inject the medial sheath bilateral 3 mg Kenalog 5 mg Xylocaine and advised on anti-inflammatories and supportive shoes. Reappoint to recheck again as needed

## 2016-11-02 ENCOUNTER — Other Ambulatory Visit: Payer: Self-pay | Admitting: Internal Medicine

## 2016-11-02 DIAGNOSIS — E113212 Type 2 diabetes mellitus with mild nonproliferative diabetic retinopathy with macular edema, left eye: Secondary | ICD-10-CM

## 2016-11-02 DIAGNOSIS — Z794 Long term (current) use of insulin: Principal | ICD-10-CM

## 2016-11-06 ENCOUNTER — Other Ambulatory Visit: Payer: Self-pay

## 2016-11-06 MED ORDER — INSULIN ISOPHANE HUMAN 100 UNIT/ML KWIKPEN: PEN_INJECTOR | SUBCUTANEOUS | 5 refills | Status: DC

## 2016-11-06 NOTE — Telephone Encounter (Signed)
Insulin NPH, Human,, Isophane, (HUMULIN N KWIKPEN) 100 UNIT/ML Kiwkpen, refill request @ [facility] on [address].

## 2016-11-06 NOTE — Telephone Encounter (Signed)
Filled today

## 2016-11-07 ENCOUNTER — Other Ambulatory Visit: Payer: Self-pay | Admitting: *Deleted

## 2016-11-07 MED ORDER — METFORMIN HCL 1000 MG PO TABS: 1000.0000 mg | ORAL_TABLET | Freq: Two times a day (BID) | ORAL | 1 refills | Status: DC

## 2016-11-12 NOTE — Progress Notes (Signed)
CC: diabetes, rash  HPI:  [name] is a 46 y.o. with a PMH of iron deficiency anemia, T2DM, HLD, blindness in left eye presenting to clinic with rash and follow up on her diabetes.  T2DM: Patient with poorly controlled insulin dependent T2DM. A1c 6/18 was 12.6 with patient reporting frequent hypoglycemic episodes. At that time, insulin regimen was decreased to Humulin 45 units qAM and 30 units qPM and she was continued on Metformin 1000mg  bid and Glipizide 5mg  BID. Today she comes in stating she feels extremely better, has had no further hypoglycemic episodes, and has more energy. She states she has not been taking Glipizide but has been consistent with metformin and Humulin. She brings in her meter which shows a wide variety of glucose reading that have generally trended down in the last week. Over last week, AM readings have been from 68 (single reading) to 152; afternoon readings are from 130s-200.   Elevated BP: Noted over several visits.  Dermatitis:  Patient endorses perioral dry skin and itching for 2-3 days since changing facial soap. She denies other areas of involvement, fevers, chills.  Patient is planning trip to [place] in August for about 2 months and she request enough medicine to last through her trip.   Please see problem based Assessment and Plan for status of patients chronic conditions.  Past Medical History:  Diagnosis Date  . Anemia, iron deficiency   . Blindness of left eye   . Decreased visual acuity    Left eye  . 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 Hair loss   . History of fracture of clavicle 05/18/2015  . Hyperlipidemia   . Pap smear abnormality of cervix with LGSIL   . Routine/ritual circumcision   . Type II diabetes mellitus ([place])   . Uveitis     Review of Systems:   Review of Systems  Constitutional:  Negative for chills, fever and malaise/fatigue.  HENT: Negative for hearing loss.   Eyes: Positive for blurred vision. Negative for double vision.  Respiratory: Negative for cough and shortness of breath.   Cardiovascular: Negative for chest pain and leg swelling.  Gastrointestinal: Negative for abdominal pain, constipation, diarrhea, nausea and vomiting.  Genitourinary: Negative for frequency.  Musculoskeletal: Negative for myalgias.  Skin: Positive for itching and rash.  Neurological: Negative for focal weakness, weakness and headaches.    Physical Exam:  Vitals:   11/13/16 1557  BP: 118/61  Pulse: 80  Temp: 98.2 F (36.8 C)  TempSrc: Oral  SpO2: 100%  Weight: 214 lb 4.8 oz (97.2 kg)  Height: 5\' 6"  (1.676 m)   Physical Exam  Constitutional: She is oriented to person, place, and time. She appears well-developed and well-nourished. No distress.  HENT:  Head: Normocephalic and atraumatic.  Neck: Normal range of motion. Neck supple.  Cardiovascular: Normal rate, regular rhythm, normal heart sounds and intact distal pulses.   Pulmonary/Chest: Effort normal and breath sounds normal. She has no wheezes. She has no rales.  Abdominal: Soft. Bowel sounds are normal. She exhibits no distension. There is no tenderness.  Musculoskeletal: Normal range of motion. She exhibits no edema or tenderness.  Neurological: She is alert and oriented to person, place, and time. No cranial nerve deficit.  Skin: Skin is warm and dry. Capillary refill takes less than 2 seconds. She is not diaphoretic.  Nasolabial erythema and dry  skin with no lesions, excoriations noted  Psychiatric: She has a normal mood and affect. Her behavior is normal. Judgment and thought content normal.    Assessment & Plan:   See Encounters Tab for problem based charting.   Patient discussed with Dr. Verdie Drown, MD Internal Medicine PGY2

## 2016-11-13 ENCOUNTER — Ambulatory Visit (INDEPENDENT_AMBULATORY_CARE_PROVIDER_SITE_OTHER): Payer: Medicaid Other | Admitting: Internal Medicine

## 2016-11-13 ENCOUNTER — Encounter: Payer: Self-pay | Admitting: Internal Medicine

## 2016-11-13 VITALS — BP 118/61 | HR 80 | Temp 98.2°F | Ht 66.0 in | Wt 214.3 lb

## 2016-11-13 DIAGNOSIS — Z794 Long term (current) use of insulin: Secondary | ICD-10-CM

## 2016-11-13 DIAGNOSIS — E113219 Type 2 diabetes mellitus with mild nonproliferative diabetic retinopathy with macular edema, unspecified eye: Secondary | ICD-10-CM

## 2016-11-13 DIAGNOSIS — L240 Irritant contact dermatitis due to detergents: Secondary | ICD-10-CM

## 2016-11-13 DIAGNOSIS — R030 Elevated blood-pressure reading, without diagnosis of hypertension: Secondary | ICD-10-CM

## 2016-11-13 DIAGNOSIS — E113212 Type 2 diabetes mellitus with mild nonproliferative diabetic retinopathy with macular edema, left eye: Secondary | ICD-10-CM

## 2016-11-13 DIAGNOSIS — R5383 Other fatigue: Secondary | ICD-10-CM

## 2016-11-13 MED ORDER — INSULIN PEN NEEDLE 31G X 5 MM MISC: 1.0000 | Freq: Two times a day (BID) | 11 refills | Status: DC

## 2016-11-13 MED ORDER — ATORVASTATIN CALCIUM 40 MG PO TABS: 40.0000 mg | ORAL_TABLET | Freq: Every day | ORAL | 3 refills | Status: DC

## 2016-11-13 MED ORDER — GLUCOSE BLOOD VI STRP: ORAL_STRIP | 5 refills | Status: DC

## 2016-11-13 MED ORDER — INSULIN ISOPHANE HUMAN 100 UNIT/ML KWIKPEN: PEN_INJECTOR | SUBCUTANEOUS | 1 refills | Status: DC

## 2016-11-13 NOTE — Patient Instructions (Signed)
Please take Humulin 45 units with breakfast and 30 units with dinner. Keep checking your sugar 3-4 times a day. Continue taking metformin 1000mg  twice a day with meals.   For your rash, wash with water and gentle, sensitive skin soap; apply only fragrance free lotions; I expect it to get better within a couple of days.  I refilled your diabetes supplies, diabetes medicines, and cholesterol medicine to last you for your trip to [place].  Please make an appointment to see me when you return.  Please visit the [facility] Department to see what vaccinations you may need prior to your trip.

## 2016-11-15 ENCOUNTER — Encounter: Payer: Self-pay | Admitting: Internal Medicine

## 2016-11-15 DIAGNOSIS — L259 Unspecified contact dermatitis, unspecified cause: Secondary | ICD-10-CM | POA: Insufficient documentation

## 2016-11-15 NOTE — Assessment & Plan Note (Signed)
Patient endorses improvement with better control of her CBGs.

## 2016-11-15 NOTE — Assessment & Plan Note (Signed)
Patient endorses perioral dry skin and itching for 2-3 days since changing facial soap. She denies other areas of involvement, fevers, chills.  Exam reveals erythema with dry skin along nasolabial folds bilaterally; no rash or lesions appreciated.   Likely allergic contact dermatitis from new soap  Plan: --advised patient to use only sensitive skin/no fragrance soaps and facial moisturizers --if no improvement can consider seborrheic dermatitis due to location

## 2016-11-15 NOTE — Progress Notes (Signed)
Medicine attending: Medical history, presenting problems, physical findings, and medications, reviewed with resident physician Dr Gorica Svalina on the day of the patient visit and I concur with her evaluation and management plan. 

## 2016-11-15 NOTE — Assessment & Plan Note (Signed)
BP 118/61 today; will continue to monitor.

## 2016-11-15 NOTE — Assessment & Plan Note (Signed)
Patient with poorly controlled insulin dependent T2DM. A1c 6/18 was 12.6 with patient reporting frequent hypoglycemic episodes. At that time, insulin regimen was decreased to Humulin 45 units qAM and 30 units qPM and she was continued on Metformin 1000mg  bid and Glipizide 5mg  BID. Today she comes in stating she feels extremely better, has had no further hypoglycemic episodes, and has more energy. She states she has not been taking Glipizide but has been consistent with metformin and Humulin. She brings in her meter which shows a wide variety of glucose reading that have generally trended down in the last week. Over last week, AM readings have been from 68 (single reading) to 152; afternoon readings are from 130s-200.  Plan: --removed glipizide from med list --will continue metformin 1000mg  BID and Humulin 45 units qAM and 30 units qPM for now especially with patient leaving the country. --refilled meds to last through trip; advised to visit HD prior to trip for appropriate vaccinations and prophylaxis if necessary --patient instructed to f/u on return

## 2016-12-25 NOTE — Progress Notes (Signed)
This encounter was created in error - please disregard.

## 2017-01-20 ENCOUNTER — Ambulatory Visit: Payer: Self-pay

## 2017-01-20 ENCOUNTER — Encounter: Payer: Self-pay | Admitting: Internal Medicine

## 2017-01-20 ENCOUNTER — Ambulatory Visit (INDEPENDENT_AMBULATORY_CARE_PROVIDER_SITE_OTHER): Payer: Medicaid Other | Admitting: Internal Medicine

## 2017-01-20 ENCOUNTER — Encounter (INDEPENDENT_AMBULATORY_CARE_PROVIDER_SITE_OTHER): Payer: Self-pay

## 2017-01-20 VITALS — BP 141/75 | HR 80 | Temp 98.7°F | Wt 198.5 lb

## 2017-01-20 DIAGNOSIS — B359 Dermatophytosis, unspecified: Secondary | ICD-10-CM | POA: Diagnosis not present

## 2017-01-20 DIAGNOSIS — D509 Iron deficiency anemia, unspecified: Secondary | ICD-10-CM | POA: Diagnosis not present

## 2017-01-20 DIAGNOSIS — E785 Hyperlipidemia, unspecified: Secondary | ICD-10-CM | POA: Diagnosis not present

## 2017-01-20 DIAGNOSIS — E113219 Type 2 diabetes mellitus with mild nonproliferative diabetic retinopathy with macular edema, unspecified eye: Secondary | ICD-10-CM

## 2017-01-20 DIAGNOSIS — Z794 Long term (current) use of insulin: Secondary | ICD-10-CM | POA: Diagnosis not present

## 2017-01-20 MED ORDER — FERROUS SULFATE 325 (65 FE) MG PO TABS: 325.0000 mg | ORAL_TABLET | Freq: Every day | ORAL | 3 refills | Status: DC

## 2017-01-20 MED ORDER — ACCU-CHEK NANO SMARTVIEW W/DEVICE KIT: PACK | 0 refills | Status: DC

## 2017-01-20 MED ORDER — FOLIC ACID 1 MG PO TABS: 1.0000 mg | ORAL_TABLET | Freq: Every day | ORAL | 1 refills | Status: DC

## 2017-01-20 MED ORDER — CLOTRIMAZOLE 1 % EX CREA: 1.0000 "application " | TOPICAL_CREAM | Freq: Two times a day (BID) | CUTANEOUS | 0 refills | Status: DC

## 2017-01-20 NOTE — Assessment & Plan Note (Addendum)
Patient is here because she had gone to [place], and while there, she had seen a rash in her inner thighs a month ago. She bought a cream from a pharmacy there which she applied for few days but did not seem to help. She said the weather was hot there. She also had some mosquito bites. She was accompanied by her family member who did the translation and said that the rash seems to have gotten better but it is very pruritic.   Exam performed with chaperone, ms Mamie, and revealed clearly demarcated erythematous and pruritic rash in the inner thighs with some satellite lesions, consistent with a tinea infection. Patient was re-assessed with Dr. Heber Vermillion who also looked at the rash.   A: Tinea cruris/corporis in the setting of chronic diabetes that has been poorly controlled in the past, and prior hot weather in [place]   Plan -given clotrimazole cream BID for 2 weeks. Asked the patient to RTC in 2 weeks for re-assessment. If symptoms do not improve then will need oral antifungal therapy- likely terbinafine.  -advised better control of her diabetes. She says she is compliant with her insulin and metformin

## 2017-01-20 NOTE — Assessment & Plan Note (Signed)
Patient says she is compliant with her insulin and metformin. She says she lost her meter in [place] and wants a new prescription for that.  Plan -given new meter -asked to bring readings and to keep the appointment on 10/31

## 2017-01-20 NOTE — Patient Instructions (Signed)
Thank you for your visit today  Please use the cream (clotrimazole) twice a day for 2 weeks. Please return to clinic in 2-3 weeks for re-assessment. Please return to clinic if your symptoms worsen   You have an appointment with your PCP on October 31st, please be sure to keep the appointment

## 2017-01-20 NOTE — Progress Notes (Signed)
    CC: rash HPI: [name] is a 46 y.o. woman with PMH noted below here for rash  Please see Problem List/A&P for the status of the patient's chronic medical problems   Past Medical History:  Diagnosis Date  . Anemia, iron deficiency   . Blindness of left eye   . Decreased visual acuity    Left eye  . 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 Hair loss   . History of fracture of clavicle 05/18/2015  . Hyperlipidemia   . Pap smear abnormality of cervix with LGSIL   . Routine/ritual circumcision   . Type II diabetes mellitus ([place])   . Uveitis     Review of Systems: Denies fevers, but has chills. Denies fatigue or weight loss Denies cough SOB Denies n/v/d/c.  Denies dysuria, increased frequency.  Traveled to [place] and came back on sept 12th.  Had some mosquito bites there.  Physical Exam: Vitals:   01/20/17 1518 01/20/17 1555  BP: (!) 95/59 (!) 141/75  Pulse: 87 80  Temp: 98.7 F (37.1 C)   TempSrc: Oral   SpO2: 100%   Weight: 198 lb 8 oz (90 kg)     General: A&O, in NAD CV: RRR, normal s1, s2, no m/r/g, Resp: equal and symmetric breath sounds, no wheezing or crackles  Abdomen: soft, nontender, nondistended, +BS Skin: warm, dry, intact, no open lesions           Clearly demarcated erythematous and pruritic rash in the inner thighs with some satellite lesions.    Assessment & Plan:   See encounters tab for problem based medical decision making. Patient seen with Dr. Angelia Mould

## 2017-01-20 NOTE — Progress Notes (Signed)
Internal Medicine Clinic Attending  I saw and evaluated the patient.  I personally confirmed the key portions of the history and exam documented by Dr. Tiburcio Pea and I reviewed pertinent patient test results.  The assessment, diagnosis, and plan were formulated together and I agree with the documentation in the resident's note. Jennifer [name] was present as chaperone for my exam, she does have a hyperpigmentented rash between her thighs, suspect this is fungal and worsened by heat (travel) as well as glucosuria from uncontrolled DM.  Need better adherence to diabetic plan.  Agree with antifungal cream for now.

## 2017-02-19 ENCOUNTER — Encounter: Payer: Self-pay | Admitting: Internal Medicine

## 2017-02-19 NOTE — Progress Notes (Deleted)
   CC: ***  HPI:  Ms.Datha Dufford is a 46 y.o. with a PMH of ***  Please see problem based Assessment and Plan for status of patients chronic conditions.  Past Medical History:  Diagnosis Date  . Anemia, iron deficiency   . Blindness of left eye   . Decreased visual acuity    Left eye  . 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 Hair loss   . History of fracture of clavicle 05/18/2015  . Hyperlipidemia   . Pap smear abnormality of cervix with LGSIL   . Routine/ritual circumcision   . Type II diabetes mellitus ([place])   . Uveitis     Review of Systems:   ROS  Physical Exam:  There were no vitals filed for this visit. GENERAL- alert, co-operative, appears as stated age, not in any distress. HEENT- Atraumatic, normocephalic, PERRL, EOMI, oral mucosa appears moist CARDIAC- RRR, no murmurs, rubs or gallops. RESP- Moving equal volumes of air, and clear to auscultation bilaterally, no wheezes or crackles. ABDOMEN- Soft, nontender, bowel sounds present. NEURO- No obvious Cr N abnormality. EXTREMITIES- pulse 2+, symmetric, no pedal edema. SKIN- Warm, dry, No rash or lesion. PSYCH- Normal mood and affect, appropriate thought content and speech.  Assessment & Plan:   See Encounters Tab for problem based charting.   Patient {GC/GE:3044014::"discussed with","seen with"} Dr. {NAMES:[id number]::"Butcher","Granfortuna","E. Hoffman","Klima","Mullen","Narendra","Vincent"}   Alphonzo Grieve, MD Internal Medicine PGY2

## 2017-02-21 ENCOUNTER — Encounter (INDEPENDENT_AMBULATORY_CARE_PROVIDER_SITE_OTHER): Payer: Self-pay

## 2017-02-21 ENCOUNTER — Encounter: Payer: Self-pay | Admitting: Internal Medicine

## 2017-02-21 ENCOUNTER — Ambulatory Visit (INDEPENDENT_AMBULATORY_CARE_PROVIDER_SITE_OTHER): Payer: Medicaid Other | Admitting: Internal Medicine

## 2017-02-21 VITALS — BP 151/58 | HR 78 | Temp 98.2°F | Ht 66.0 in | Wt 204.7 lb

## 2017-02-21 DIAGNOSIS — D509 Iron deficiency anemia, unspecified: Secondary | ICD-10-CM

## 2017-02-21 DIAGNOSIS — E113219 Type 2 diabetes mellitus with mild nonproliferative diabetic retinopathy with macular edema, unspecified eye: Secondary | ICD-10-CM

## 2017-02-21 DIAGNOSIS — Z794 Long term (current) use of insulin: Secondary | ICD-10-CM

## 2017-02-21 DIAGNOSIS — E559 Vitamin D deficiency, unspecified: Secondary | ICD-10-CM

## 2017-02-21 LAB — GLUCOSE, CAPILLARY: Glucose-Capillary: 215 mg/dL — ABNORMAL HIGH (ref 65–99)

## 2017-02-21 LAB — POCT GLYCOSYLATED HEMOGLOBIN (HGB A1C): Hemoglobin A1C: >14

## 2017-02-21 MED ORDER — INSULIN ISOPHANE HUMAN 100 UNIT/ML KWIKPEN: PEN_INJECTOR | SUBCUTANEOUS | 1 refills | Status: DC

## 2017-02-21 MED ORDER — FERROUS SULFATE 325 (65 FE) MG PO TABS: 325.0000 mg | ORAL_TABLET | Freq: Every day | ORAL | 3 refills | Status: DC

## 2017-02-21 MED ORDER — FOLIC ACID 1 MG PO TABS: 1.0000 mg | ORAL_TABLET | Freq: Every day | ORAL | 1 refills | Status: DC

## 2017-02-21 MED ORDER — VITAMIN C 500 MG PO TABS: 500.0000 mg | ORAL_TABLET | Freq: Every day | ORAL | 2 refills | Status: DC

## 2017-02-21 MED ORDER — VITAMIN D (CHOLECALCIFEROL) 25 MCG (1000 UT) PO CAPS: 1000.0000 mg | ORAL_CAPSULE | Freq: Every day | ORAL | 5 refills | Status: DC

## 2017-02-21 MED ORDER — METFORMIN HCL 1000 MG PO TABS: 1000.0000 mg | ORAL_TABLET | Freq: Two times a day (BID) | ORAL | 1 refills | Status: DC

## 2017-02-21 MED ORDER — CANAGLIFLOZIN 100 MG PO TABS: 100.0000 mg | ORAL_TABLET | Freq: Every day | ORAL | 1 refills | Status: DC

## 2017-02-21 NOTE — Patient Instructions (Addendum)
Jennifer [name],  I am going to start you on a new medication today for your diabetes called Invokana. Take it once a day before breakfast.   I am also going to change your insulin dosing. Please decrease your nighttime dosing of insulin to 20 units every night. If you continue to have low blood sugar readings in the morning please call us and we can continue to adjust your dose.   Please follow up with me at the next of November for re-check. Please check your blood sugars 2-3 times a day at home and bring your meter with you.  Canagliflozin oral tablets What is this medicine? CANAGLIFLOZIN (KAN a gli FLOE zin) helps to treat type 2 diabetes. It helps to control blood sugar. Treatment is combined with diet and exercise. This medicine may be used for other purposes; ask your health care provider or pharmacist if you have questions. COMMON BRAND NAME(S): Invokana What should I tell my health care provider before I take this medicine? They need to know if you have any of these conditions: -artery disease -dehydration -diabetic ketoacidosis -diet low in salt -eating less due to illness, surgery, dieting, or any other reason -foot sores -having surgery -high cholesterol -high levels of potassium in the blood -history of amputation -history of pancreatitis or pancreas problems -history of yeast infection of the penis or vagina -if you often drink alcohol -infections in the bladder, kidneys, or urinary tract -kidney disease -liver disease -low blood pressure -nerve damage -on hemodialysis -problems urinating -type 1 diabetes -uncircumcised female -an unusual or allergic reaction to canagliflozin, other medicines, foods, dyes, or preservatives -pregnant or trying to get pregnant -breast-feeding How should I use this medicine? Take this medicine by mouth with a glass of water. Follow the directions on the prescription label. Take it before the first meal of the day. Take your dose at the  same time each day. Do not take more often than directed. Do not stop taking except on your doctor's advice. A special MedGuide will be given to you by the pharmacist with each prescription and refill. Be sure to read this information carefully each time. Talk to your pediatrician regarding the use of this medicine in children. Special care may be needed. Overdosage: If you think you have taken too much of this medicine contact a poison control center or emergency room at once. NOTE: This medicine is only for you. Do not share this medicine with others. What if I miss a dose? If you miss a dose, take it as soon as you can. If it is almost time for your next dose, take only that dose. Do not take double or extra doses. What may interact with this medicine? Do not take this medicine with any of the following medications: -gatifloxacin This medicine may also interact with the following medications: -alcohol -certain medicines for blood pressure, heart disease -digoxin -diuretics -insulin -nateglinide -phenobarbital -phenytoin -repaglinide -rifampin -ritonavir -sulfonylureas like glimepiride, glipizide, glyburide This list may not describe all possible interactions. Give your health care provider a list of all the medicines, herbs, non-prescription drugs, or dietary supplements you use. Also tell them if you smoke, drink alcohol, or use illegal drugs. Some items may interact with your medicine. What should I watch for while using this medicine? Visit your doctor or health care professional for regular checks on your progress. This medicine can cause a serious condition in which there is too much acid in the blood. If you develop nausea, vomiting, stomach pain,  unusual tiredness, or breathing problems, stop taking this medicine and call your doctor right away. If possible, use a ketone dipstick to check for ketones in your urine. A test called the HbA1C (A1C) will be monitored. This is a simple  blood test. It measures your blood sugar control over the last 2 to 3 months. You will receive this test every 3 to 6 months. Learn how to check your blood sugar. Learn the symptoms of low and high blood sugar and how to manage them. Always carry a quick-source of sugar with you in case you have symptoms of low blood sugar. Examples include hard sugar candy or glucose tablets. Make sure others know that you can choke if you eat or drink when you develop serious symptoms of low blood sugar, such as seizures or unconsciousness. They must get medical help at once. Tell your doctor or health care professional if you have high blood sugar. You might need to change the dose of your medicine. If you are sick or exercising more than usual, you might need to change the dose of your medicine. Do not skip meals. Ask your doctor or health care professional if you should avoid alcohol. Many nonprescription cough and cold products contain sugar or alcohol. These can affect blood sugar. Wear a medical ID bracelet or chain, and carry a card that describes your disease and details of your medicine and dosage times. What side effects may I notice from receiving this medicine? Side effects that you should report to your doctor or health care professional as soon as possible: -allergic reactions like skin rash, itching or hives, swelling of the face, lips, or tongue -breathing problems -chest pain -dizziness -fast or irregular heartbeat -feeling faint or lightheaded, falls -muscle weakness -nausea, vomiting, unusual stomach upset or pain -new pain or tenderness, change in skin color, sores or ulcers, or infection in legs or feet -signs and symptoms of low blood sugar such as feeling anxious, confusion, dizziness, increased hunger, unusually weak or tired, sweating, shakiness, cold, irritable, headache, blurred vision, fast heartbeat, loss of consciousness -signs and symptoms of a urinary tract infection, such as  fever, chills, a burning feeling when urinating, blood in the urine, back pain -trouble passing urine or change in the amount of urine, including an urgent need to urinate more often, in larger amounts, or at night -penile discharge, itching, or pain in men -unusual tiredness -vaginal discharge, itching, or odor in women Side effects that usually do not require medical attention (report to your doctor or health care professional if they continue or are bothersome): -constipation -mild increase in urination -thirsty This list may not describe all possible side effects. Call your doctor for medical advice about side effects. You may report side effects to FDA at 1-800-FDA-1088. Where should I keep my medicine? Keep out of the reach of children. Store at room temperature between 20 and 25 degrees C (68 and 77 degrees F). Throw away any unused medicine after the expiration date. NOTE: This sheet is a summary. It may not cover all possible information. If you have questions about this medicine, talk to your doctor, pharmacist, or health care provider.  2018 Elsevier/Gold Standard (2015-09-05 14:17:20)

## 2017-02-21 NOTE — Assessment & Plan Note (Addendum)
Lab Results  Component Value Date   HGBA1C >14.0 02/21/2017   HGBA1C 12.6 09/25/2016   HGBA1C 10.2 06/25/2016    Patient with uncontrolled DM type II. A1c today is >14. She is currently prescribed metformin 1000 mg bid, Humulin 45 units qAM and 30 units qPM. Previously on Glipizide as well but was having frequent episodes of hypoglycemia and this was discontinued.   Reports having episodes of hypoglycemia in the morning. Checks her CBGs in the morning and reports 70-80s. Reports episodes ~3 times a week. Daughter with her today reports that she has a poor appetite and only eats about 1 small meal a day.   Brought her meter with her today. 19 readings. High 554 and low 63. Average 228. Has several lows in the monrinn  A/P: Given her frequent hypoglycemia in the mornings will decrease her dose of night time insulin to 20 units. Continue metformin and qam insulin dosing as before. Discussed additional medications including DDP4-I and SGLT-2 inhibitors. Risks and benefits of both were discussed and she opted for the SLT-2. Will start Invokana 100 mg qam today. Has follow up with PCP scheduled for 11/28.   Check BMET today.   ADDENDUM Invokana rejected by insurance. Will start Jardiance 10 mg daily instead.

## 2017-02-21 NOTE — Assessment & Plan Note (Signed)
Requests refill of her Fe supplement. Check CBC today.

## 2017-02-21 NOTE — Progress Notes (Signed)
   CC: DM follow up  HPI:  Ms.Jasmarie Crihfield is a 46 y.o. female with a past medical history listed below here today for follow up of her DM.  For details of today's visit and the status of her chronic medical issues please refer to the assessment and plan.  Past Medical History:  Diagnosis Date  . Anemia, iron deficiency   . Blindness of left eye   . Decreased visual acuity    Left eye  . 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 Hair loss   . History of fracture of clavicle 05/18/2015  . Hyperlipidemia   . Pap smear abnormality of cervix with LGSIL   . Routine/ritual circumcision   . Type II diabetes mellitus ([place])   . Uveitis    Review of Systems:   No chest pain or shortness of breath  Physical Exam:  Vitals:   02/21/17 1325  BP: (!) 151/58  Pulse: 78  Temp: 98.2 F (36.8 C)  TempSrc: Oral  SpO2: 100%  Weight: 204 lb 11.2 oz (92.9 kg)  Height: 5\' 6"  (1.676 m)   GENERAL- alert, co-operative, appears as stated age, not in any distress. CARDIAC- RRR, no murmurs, rubs or gallops. RESP- Moving equal volumes of air, and clear to auscultation bilaterally ABDOMEN- Soft, nontender, bowel sounds present. EXTREMITIES- pulse 2+, symmetric, no pedal edema. SKIN- Warm, dry, No rash or lesion. PSYCH- Normal mood and affect, appropriate thought content and speech.   Assessment & Plan:   See Encounters Tab for problem based charting.  Patient discussed with Dr. Rebeca Alert

## 2017-02-21 NOTE — Assessment & Plan Note (Signed)
Vit D refilled today

## 2017-02-22 LAB — BMP8+ANION GAP
Anion Gap: 14 mmol/L (ref 10.0–18.0)
BUN/Creatinine Ratio: 26 — ABNORMAL HIGH (ref 9–23)
BUN: 18 mg/dL (ref 6–24)
CO2: 24 mmol/L (ref 20–29)
Calcium: 9.2 mg/dL (ref 8.7–10.2)
Chloride: 99 mmol/L (ref 96–106)
Creatinine, Ser: 0.68 mg/dL (ref 0.57–1.00)
GFR calc Af Amer: 122 mL/min/{1.73_m2} (ref >59)
GFR calc non Af Amer: 106 mL/min/{1.73_m2} (ref >59)
Glucose: 200 mg/dL — ABNORMAL HIGH (ref 65–99)
Potassium: 4.1 mmol/L (ref 3.5–5.2)
Sodium: 137 mmol/L (ref 134–144)

## 2017-02-22 LAB — CBC
Hematocrit: 37.2 % (ref 34.0–46.6)
Hemoglobin: 11.8 g/dL (ref 11.1–15.9)
MCH: 26.5 pg — ABNORMAL LOW (ref 26.6–33.0)
MCHC: 31.7 g/dL (ref 31.5–35.7)
MCV: 83 fL (ref 79–97)
Platelets: 186 10*3/uL (ref 150–379)
RBC: 4.46 x10E6/uL (ref 3.77–5.28)
RDW: 13.7 % (ref 12.3–15.4)
WBC: 6.4 10*3/uL (ref 3.4–10.8)

## 2017-02-24 NOTE — Progress Notes (Signed)
Internal Medicine Clinic Attending  Case discussed with Dr. Boswell  at the time of the visit.  We reviewed the resident's history and exam and pertinent patient test results.  I agree with the assessment, diagnosis, and plan of care documented in the resident's note.  Alexander N Raines, MD   

## 2017-03-11 ENCOUNTER — Telehealth: Payer: Self-pay

## 2017-03-11 NOTE — Telephone Encounter (Signed)
Needs PA on both meds

## 2017-03-11 NOTE — Telephone Encounter (Signed)
Insulin NPH, Human,, Isophane, (HUMULIN N KWIKPEN) 100 UNIT/ML Kiwkpen,  canagliflozin (INVOKANA) 100 MG TABS tablet, is not at the pharmacy. Pt is using [facility] [address].

## 2017-03-19 ENCOUNTER — Ambulatory Visit (INDEPENDENT_AMBULATORY_CARE_PROVIDER_SITE_OTHER): Payer: Medicaid Other | Admitting: Internal Medicine

## 2017-03-19 ENCOUNTER — Telehealth: Payer: Self-pay | Admitting: *Deleted

## 2017-03-19 DIAGNOSIS — E113212 Type 2 diabetes mellitus with mild nonproliferative diabetic retinopathy with macular edema, left eye: Secondary | ICD-10-CM

## 2017-03-19 DIAGNOSIS — E113219 Type 2 diabetes mellitus with mild nonproliferative diabetic retinopathy with macular edema, unspecified eye: Secondary | ICD-10-CM

## 2017-03-19 DIAGNOSIS — Z794 Long term (current) use of insulin: Secondary | ICD-10-CM | POA: Diagnosis not present

## 2017-03-19 MED ORDER — EMPAGLIFLOZIN 10 MG PO TABS: 10.0000 mg | ORAL_TABLET | Freq: Every day | ORAL | 2 refills | Status: DC

## 2017-03-19 MED ORDER — INSULIN ISOPHANE HUMAN 100 UNIT/ML KWIKPEN: PEN_INJECTOR | SUBCUTANEOUS | 1 refills | Status: DC

## 2017-03-19 NOTE — Addendum Note (Signed)
Addended by: McLennan Lions on: 03/19/2017 09:56 AM   Modules accepted: Orders

## 2017-03-19 NOTE — Telephone Encounter (Addendum)
Call to [place] for PA for Humilin N KwikPen.  Approved as patient has visual problems and cannot use the vials. Approved 03/19/2017 thru 03/18/2018 .  [id number].  [id number] interaction code.   [facility] was called and notified of the approval.  Spoke with DR. Boswell about Anastasio Auerbach which is not covered and need to change to a preferred medication Jardiance or Farxiga.Anastasio Auerbach was changed to Jardiance 10 mg tablets.  Sander Nephew, RN 03/19/2017 10:02 AM  Information for PA for Jardiance was sent to [place]. Tracks awaiting determination.  [id number] Nelva Nay, RN 03/19/2017 10:22 AM  Prior Authorization for Jardiance 10 mg tablets was approved .  03/19/2017 through 03/19/2018.  Sander Nephew, RN 03/19/2017 4:28 PM.

## 2017-03-19 NOTE — Patient Instructions (Signed)
For your diabetes: --keep taking metformin 1000mg  twice a day with meals --keep taking Humulin insulin 45 units with breakfast and 20 units with dinner --when it's approved, start taking Jardiance 10mg  once daily  It is very important to eat regular, scheduled meals throughout the day to keep you from getting your sugar too low.   Please check your sugars 2 to 3 times per day and keep bringing your meter to your appointments.   I'd like to see you in about one month to see how your sugars are doing.

## 2017-03-19 NOTE — Progress Notes (Signed)
   CC: diabetes  HPI:  [name] is a 46 y.o. with a PMH of uncontrolled T2DM,  iron deficiency anemia, HLD, blindness in left eye presenting to clinic for f/u on her diabetes.  Per patient request, translation is provided by patient's daughter.  Patient last A1c was >14 one month ago. She is on metformin 1000mg  daily Humulin 45 units qAM and 20 units qPM (evening dose decreased due to AM hypoglycemia); she was started on Invokana but this was declined by her insurance so switch was made day of her appt to empagliflozin so she hasn't started this therapy yet. She has had trouble getting insulin as she is being told she has to pay full price for it now so she is almost out. She brings in her meter which shows CBGs from 85 to 300s. She states that she still has feelings of hypoglycemia and indicates that CBGs (which are in range - mainly morning/early afternoon) is when she feels this, but other times she doesn't check her CBGs. She eats little throughout the day due to fear of getting high blood sugars.   Patient had questions about using her lancets and new meter today; pharmacy visited with patient and completed education.  Her hypoglycemia includes feeling fatigued and weak. She denies nausea, vomiting, change in vision. Patient denies chest pain, shortness of breath.  Please see problem based Assessment and Plan for status of patients chronic conditions.  Past Medical History:  Diagnosis Date  . Anemia, iron deficiency   . Blindness of left eye   . Decreased visual acuity    Left eye  . 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 Hair loss   . History of fracture of clavicle 05/18/2015  . Hyperlipidemia   . Pap smear abnormality of cervix with LGSIL   . Routine/ritual circumcision   . Type II diabetes mellitus ([place])   . Uveitis     Review of  Systems:   ROS Per HPI  Physical Exam:  Vitals:   03/19/17 1538  BP: 137/67  Pulse: 79  Temp: 98 F (36.7 C)  TempSrc: Oral  Weight: 207 lb 14.4 oz (94.3 kg)   GENERAL- alert, co-operative, appears as stated age, not in any distress. HEENT- Atraumatic, normocephalic, oral mucosa appears moist CARDIAC- RRR, no murmurs, rubs or gallops. RESP- Moving equal volumes of air, and clear to auscultation bilaterally, no wheezes or crackles. ABDOMEN- Soft, nontender, bowel sounds present. EXTREMITIES- pulse 2+, symmetric, no pedal edema. SKIN- Warm, dry, no rash or lesion. PSYCH- Normal mood and affect, appropriate thought content and speech.  Assessment & Plan:   See Encounters Tab for problem based charting.   Patient discussed with Dr. Verdie Drown, MD Internal Medicine PGY2

## 2017-03-20 MED ORDER — ACCU-CHEK SOFT TOUCH LANCETS MISC: 11 refills | Status: DC

## 2017-03-22 ENCOUNTER — Encounter: Payer: Self-pay | Admitting: Internal Medicine

## 2017-03-22 NOTE — Assessment & Plan Note (Signed)
Uncontrolled. Patient endorses continued hypoglycemic symptoms but less frequently than before medication change. Some of the times she experiences these symptoms, she is actually in range which indicated relative hypoglycemia.   Plan: --continue metformin 1000mg  BID --prior auth sent for Humulin 45 units qAM and 20 units qPM --staff checked with pharmacy and she should be able to pick up empagliflozin 10mg  daily --f/u in 1 month

## 2017-03-24 NOTE — Progress Notes (Signed)
Medicine attending: I personally interviewed and briefly examined this patient on the day of the patient visit and reviewed pertinent clinical ,laboratory data  with resident physician Dr. Alphonzo Grieve and we discussed a management plan.

## 2017-04-23 ENCOUNTER — Emergency Department (HOSPITAL_COMMUNITY): Payer: Medicaid Other

## 2017-04-23 ENCOUNTER — Encounter (HOSPITAL_COMMUNITY): Payer: Self-pay | Admitting: Emergency Medicine

## 2017-04-23 ENCOUNTER — Other Ambulatory Visit: Payer: Self-pay

## 2017-04-23 ENCOUNTER — Emergency Department (HOSPITAL_COMMUNITY)
Admission: EM | Admit: 2017-04-23 | Discharge: 2017-04-23 | Disposition: A | Discharge status: Home / Self Care | Payer: Medicaid Other | Attending: Emergency Medicine | Admitting: Emergency Medicine

## 2017-04-23 DIAGNOSIS — Z79899 Other long term (current) drug therapy: Secondary | ICD-10-CM | POA: Insufficient documentation

## 2017-04-23 DIAGNOSIS — R5383 Other fatigue: Secondary | ICD-10-CM | POA: Diagnosis present

## 2017-04-23 DIAGNOSIS — E119 Type 2 diabetes mellitus without complications: Secondary | ICD-10-CM | POA: Insufficient documentation

## 2017-04-23 DIAGNOSIS — Z794 Long term (current) use of insulin: Secondary | ICD-10-CM | POA: Diagnosis not present

## 2017-04-23 DIAGNOSIS — M25512 Pain in left shoulder: Secondary | ICD-10-CM | POA: Diagnosis not present

## 2017-04-23 DIAGNOSIS — M25511 Pain in right shoulder: Secondary | ICD-10-CM | POA: Insufficient documentation

## 2017-04-23 DIAGNOSIS — M7918 Myalgia, other site: Secondary | ICD-10-CM | POA: Diagnosis not present

## 2017-04-23 DIAGNOSIS — M791 Myalgia, unspecified site: Secondary | ICD-10-CM

## 2017-04-23 DIAGNOSIS — D509 Iron deficiency anemia, unspecified: Secondary | ICD-10-CM | POA: Insufficient documentation

## 2017-04-23 DIAGNOSIS — H5789 Other specified disorders of eye and adnexa: Secondary | ICD-10-CM | POA: Diagnosis not present

## 2017-04-23 DIAGNOSIS — M2550 Pain in unspecified joint: Secondary | ICD-10-CM

## 2017-04-23 DIAGNOSIS — E785 Hyperlipidemia, unspecified: Secondary | ICD-10-CM | POA: Diagnosis not present

## 2017-04-23 LAB — COMPREHENSIVE METABOLIC PANEL
ALT: 15 U/L (ref 14–54)
ANION GAP: 8 (ref 5–15)
AST: 16 U/L (ref 15–41)
Albumin: 3.3 g/dL — ABNORMAL LOW (ref 3.5–5.0)
Alkaline Phosphatase: 127 U/L — ABNORMAL HIGH (ref 38–126)
BUN: 17 mg/dL (ref 6–20)
CO2: 26 mmol/L (ref 22–32)
CREATININE: 0.62 mg/dL (ref 0.44–1.00)
Calcium: 9.3 mg/dL (ref 8.9–10.3)
Chloride: 100 mmol/L — ABNORMAL LOW (ref 101–111)
Glucose, Bld: 222 mg/dL — ABNORMAL HIGH (ref 65–99)
POTASSIUM: 4.2 mmol/L (ref 3.5–5.1)
Sodium: 134 mmol/L — ABNORMAL LOW (ref 135–145)
Total Bilirubin: 0.5 mg/dL (ref 0.3–1.2)
Total Protein: 7.4 g/dL (ref 6.5–8.1)

## 2017-04-23 LAB — CBC
HCT: 38.1 % (ref 36.0–46.0)
Hemoglobin: 12 g/dL (ref 12.0–15.0)
MCH: 27.3 pg (ref 26.0–34.0)
MCHC: 31.5 g/dL (ref 30.0–36.0)
MCV: 86.6 fL (ref 78.0–100.0)
PLATELETS: 175 10*3/uL (ref 150–400)
RBC: 4.4 MIL/uL (ref 3.87–5.11)
RDW: 13.9 % (ref 11.5–15.5)
WBC: 6.1 10*3/uL (ref 4.0–10.5)

## 2017-04-23 LAB — TSH: TSH: 3.238 u[IU]/mL (ref 0.350–4.500)

## 2017-04-23 MED ORDER — ACETAMINOPHEN 325 MG PO TABS
650.0000 mg | ORAL_TABLET | Freq: Once | ORAL | Status: AC
  Administered 2017-04-23: 650 mg via ORAL
  Filled 2017-04-23: qty 2

## 2017-04-23 MED ORDER — IBUPROFEN 400 MG PO TABS: 400.0000 mg | ORAL_TABLET | Freq: Four times a day (QID) | ORAL | 0 refills | Status: DC | PRN

## 2017-04-23 MED ORDER — ARTIFICIAL TEARS OPHTHALMIC OINT
1.0000 "application " | TOPICAL_OINTMENT | Freq: Once | OPHTHALMIC | Status: AC
  Administered 2017-04-23: 1 via OPHTHALMIC
  Filled 2017-04-23: qty 3.5

## 2017-04-23 NOTE — ED Provider Notes (Signed)
[facility] EMERGENCY DEPARTMENT Provider Note   CSN: [id number] Arrival date & time: 04/23/17  0502     History   Chief Complaint Chief Complaint  Patient presents with  . Shoulder Pain  . Fatigue    HPI Jennifer [name] is a 47 y.o. female.  HPI Pt started having symptoms a few weeks ago.  She thinks it started after a fall.  She landed on outstretched arms.  It started with feeling fatigued.  Her muscles have been aching.  She started feeling soreness in both of her shoulders.  It hurts to try to raise her arms.  She also has been having pain in her neck.  SHe also has been losing hair, gaining weight and excessive weight gain.   Past Medical History:  Diagnosis Date  . Anemia, iron deficiency   . Blindness of left eye   . Decreased visual acuity    Left eye  . 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 Hair loss   . History of fracture of clavicle 05/18/2015  . Hyperlipidemia   . Pap smear abnormality of cervix with LGSIL   . Routine/ritual circumcision   . Type II diabetes mellitus ([place])   . Uveitis     Patient Active Problem List   Diagnosis Date Noted  . Tinea 01/20/2017  . Elevated blood pressure reading 07/23/2016  . 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 Fatigue 04/02/2016  . Bilateral lower extremity edema 02/08/2016  . 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 Vitamin D deficiency 10/30/2014  . Iron deficiency anemia 05/13/2013  . Healthcare maintenance 02/26/2011  . Hyperlipidemia 05/05/2006  . 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 CESAREAN SECTION      OB History    Gravida Para Term Preterm AB Living   _0 SAB TAB Ectopic Multiple Live Births   3                Home Medications    Prior to Admission medications   Medication Sig Start Date End Date Taking? Authorizing Provider  atorvastatin (LIPITOR) 40 MG tablet Take 1 tablet (40 mg total) by mouth daily. 11/13/16   Alphonzo Grieve, MD  atropine 1 % ophthalmic solution Place 1 drop into both eyes daily.     [provider]  Blood Glucose Monitoring Suppl (ACCU-CHEK NANO SMARTVIEW) w/Device KIT 1 kit 01/20/17   Burgess Estelle, MD  clotrimazole (LOTRIMIN) 1 % cream Apply 1 application topically 2 (two) times daily. For 2 weeks. Return to clinic in 2 weeks around 10/15 for assessment 01/20/17   Burgess Estelle, MD  dorzolamide-timolol (COSOPT) 22.3-6.8 MG/ML ophthalmic solution Place 1 drop into the left eye 2 (two) times daily.    [provider]  empagliflozin (JARDIANCE) 10 MG TABS tablet Take 10 mg by mouth daily. 03/19/17   Maryellen Pile, MD  ferrous sulfate 325 (65 FE) MG tablet Take 1 tablet (325 mg total) by mouth daily. 02/21/17 02/21/18  Maryellen Pile, MD  ferrous sulfate 325 (65 FE) MG tablet Take 1 tablet (325 mg total) by mouth daily. 02/21/17 02/21/18  Maryellen Pile, MD  folic acid (FOLVITE) 1 MG tablet Take 1 tablet (1 mg  total) by mouth daily. 02/21/17   Maryellen Pile, MD  glucose blood (ACCU-CHEK SMARTVIEW) test strip CHECK BLOOD SUGAR 4 TIMES DAILY BEFORE MEALS AND AT BEDTIME 11/13/16   Alphonzo Grieve, MD  ibuprofen (ADVIL,MOTRIN) 400 MG tablet Take 1 tablet (400 mg total) by mouth every 6 (six) hours as needed. 04/23/17   Dorie Rank, MD  Insulin NPH, Human,, Isophane, (HUMULIN N KWIKPEN) 100 UNIT/ML Kiwkpen INJECT 45 UNITS SUBCUTANEOUSLY EVERY MORNING AND 20 UNITS EVERY EVENING 03/19/17   Alphonzo Grieve, MD  Insulin Pen Needle 31G X 5 MM MISC 1 Dose by Does not apply route 2 (two) times daily. 11/13/16   Alphonzo Grieve, MD  ketorolac (ACULAR) 0.5 % ophthalmic solution Place 1 drop into both eyes 4 (four) times daily.    [provider]  Lancets  (ACCU-CHEK SOFT TOUCH) lancets Check Blood Sugar 4 times daily before meals and at bedtime. Dx code: E11.321 03/20/17   Alphonzo Grieve, MD  metFORMIN (GLUCOPHAGE) 1000 MG tablet Take 1 tablet (1,000 mg total) by mouth 2 (two) times daily with a meal. 02/21/17   Maryellen Pile, MD  methotrexate (RHEUMATREX) 2.5 MG tablet Take 15 mg by mouth once a week. 04/23/16   [provider]  prednisoLONE acetate (PRED FORTE) 1 % ophthalmic suspension Place 1 drop into the right eye 2 (two) times daily.     [provider]  vitamin C (ASCORBIC ACID) 500 MG tablet Take 1 tablet (500 mg total) by mouth daily. 02/21/17   Maryellen Pile, MD  Vitamin D, Cholecalciferol, 1000 units CAPS Take 1,000 mg by mouth daily with breakfast. 02/21/17   Maryellen Pile, MD    Family History History reviewed. No pertinent family history.  Social History Social History   Tobacco Use  . Smoking status: Never Smoker  . Smokeless tobacco: Never Used  Substance Use Topics  . Alcohol use: No    Alcohol/week: 0.0 oz  . Drug use: No     Allergies   Patient has no known allergies.   Review of Systems Review of Systems  Eyes:       Chronic eye irritation left eye, left eye is blind from glaucoma, 4 years ago  Respiratory: Negative for shortness of breath.   Cardiovascular: Negative for chest pain.  Gastrointestinal: Negative for abdominal pain.  Genitourinary: Negative for dysuria.  Musculoskeletal: Positive for arthralgias, neck pain and neck stiffness.  All other systems reviewed and are negative.    Physical Exam Updated Vital Signs BP (!) 158/79 (BP Location: Right Arm)   Pulse 81   Temp 98.7 F (37.1 C) (Oral)   Resp 18   Ht 1.626 m (_0 )   Wt 89.4 kg (197 lb)   LMP 08/21/2016 (Exact Date)   SpO2 100%   BMI 33.81 kg/m   Physical Exam  Constitutional: She appears well-developed and well-nourished. No distress.  HENT:  Head: Normocephalic and atraumatic.  Right Ear: External ear  normal.  Left Ear: External ear normal.  Eyes: Conjunctivae are normal. Right eye exhibits no discharge. Left eye exhibits no discharge. No scleral icterus.  Neck: Neck supple. No tracheal deviation present.  Cardiovascular: Normal rate, regular rhythm and intact distal pulses.  Pulmonary/Chest: Effort normal and breath sounds normal. No stridor. No respiratory distress. She has no wheezes. She has no rales.  Abdominal: Soft. Bowel sounds are normal. She exhibits no distension. There is no tenderness. There is no rebound and no guarding.  Musculoskeletal: She exhibits no edema or tenderness.  Neurological: She is alert. She has normal strength. No cranial nerve deficit (no facial droop, extraocular movements intact, no slurred speech) or sensory deficit. She exhibits normal muscle tone. She displays no seizure activity. Coordination normal.  Skin: Skin is warm and dry. No rash noted.  Psychiatric: She has a normal mood and affect.  Nursing note and vitals reviewed.    ED Treatments / Results  Labs (all labs ordered are listed, but only abnormal results are displayed) Labs Reviewed  COMPREHENSIVE METABOLIC PANEL - Abnormal; Notable for the following components:      Result Value   Sodium 134 (*)    Chloride 100 (*)    Glucose, Bld 222 (*)    Albumin 3.3 (*)    Alkaline Phosphatase 127 (*)    All other components within normal limits  CBC  TSH     Radiology Dg Cervical Spine Complete  Result Date: 04/23/2017 CLINICAL DATA:  Fall several weeks ago. EXAM: CERVICAL SPINE - COMPLETE 4+ VIEW COMPARISON:  None. FINDINGS: There is no evidence of cervical spine fracture or prevertebral soft tissue swelling. Alignment is normal. No other significant bone abnormalities are identified. IMPRESSION: Negative cervical spine radiographs. Electronically Signed   By: Franchot Gallo M.D.   On: 04/23/2017 10:33   Dg Shoulder Right  Result Date: 04/23/2017 CLINICAL DATA:  Fall.  Shoulder pain. EXAM:  RIGHT SHOULDER - 2+ VIEW COMPARISON:  10/17/2015 FINDINGS: There is no evidence of fracture or dislocation. There is no evidence of arthropathy or other focal bone abnormality. Soft tissues are unremarkable. IMPRESSION: Negative. Electronically Signed   By: Misty Stanley M.D.   On: 04/23/2017 10:31   Dg Shoulder Left  Result Date: 04/23/2017 CLINICAL DATA:  Fall several weeks ago. EXAM: LEFT SHOULDER - 2+ VIEW COMPARISON:  None. FINDINGS: There is no evidence of fracture or dislocation. There is no evidence of arthropathy or other focal bone abnormality. Soft tissues are unremarkable. IMPRESSION: Negative. Electronically Signed   By: Franchot Gallo M.D.   On: 04/23/2017 10:33    Procedures Procedures (including critical care time)  Medications Ordered in ED Medications  artificial tears (LACRILUBE) ophthalmic ointment 1 application (1 application Left Eye Given 04/23/17 1031)  acetaminophen (TYLENOL) tablet 650 mg (650 mg Oral Given 04/23/17 1038)     Initial Impression / Assessment and Plan / ED Course  I have reviewed the triage vital signs and the nursing notes.  Pertinent labs & imaging results that were available during my care of the patient were reviewed by me and considered in my medical decision making (see chart for details).   Patient presents to the emergency room with complaints of fatigue and neck and shoulder aching for several weeks..  She denies any trouble with chest pain or shortness of breath.  Vital signs show mild hypertension but otherwise unremarkable.  Laboratory tests are reassuring.  No clear etiology.  It is possible she may be experiencing symptoms from fibromyalgia or other rheumatologic condition.  I recommend she follow-up with her primary care doctor for further evaluation  Patient also complains of persistent eye irritation.  I reviewed her medical records.  She is followed by an ophthalmologist.  This unfortunately is a persistent issue for her.  Patient was  given lubricating eyedrops which provided some comfort for her.  I recommend she contact her ophthalmologist for further treatment.   Final Clinical Impressions(s) / ED Diagnoses   Final diagnoses:  Myalgia  Arthralgia, unspecified joint    ED Discharge  Orders        Ordered    ibuprofen (ADVIL,MOTRIN) 400 MG tablet  Every 6 hours PRN     04/23/17 1115       Dorie Rank, MD 04/23/17 1120

## 2017-04-23 NOTE — ED Notes (Signed)
ED Provider at bedside. 

## 2017-04-23 NOTE — ED Notes (Signed)
Patient transported to X-ray 

## 2017-04-23 NOTE — Discharge Instructions (Signed)
Follow-up with your primary care doctor as we discussed.  Also make sure to follow-up with your eye doctor regarding your  persistent difficulties.

## 2017-04-23 NOTE — ED Triage Notes (Signed)
Patient arrives with complaint of bilateral shoulder pain primarily. States history of fall about 3 weeks ago where she landed on out stretched arms. Patient goes on to describe feeling tired for 3-4 months and general body discomfort. Also endorses hair loss, excessive sleep, and weight gain.

## 2017-04-24 ENCOUNTER — Telehealth: Payer: Self-pay | Admitting: *Deleted

## 2017-04-24 NOTE — Telephone Encounter (Signed)
Received call /fax from [facility] for approval for eye injection.  I will put fax in your mailbox in the clinic. Injection scheduled 1/8. Thanks

## 2017-04-25 NOTE — Telephone Encounter (Signed)
I have completed letter to be faxed back to her ophthalmologist. Thank you!  Jennifer [name]

## 2017-04-29 ENCOUNTER — Other Ambulatory Visit: Payer: Self-pay

## 2017-04-29 ENCOUNTER — Encounter: Payer: Self-pay | Admitting: Internal Medicine

## 2017-04-29 ENCOUNTER — Ambulatory Visit (INDEPENDENT_AMBULATORY_CARE_PROVIDER_SITE_OTHER): Payer: Medicaid Other | Admitting: Internal Medicine

## 2017-04-29 VITALS — BP 105/62 | HR 83 | Temp 98.0°F | Wt 213.0 lb

## 2017-04-29 DIAGNOSIS — H4089 Other specified glaucoma: Secondary | ICD-10-CM | POA: Diagnosis not present

## 2017-04-29 DIAGNOSIS — R7989 Other specified abnormal findings of blood chemistry: Secondary | ICD-10-CM

## 2017-04-29 DIAGNOSIS — E1139 Type 2 diabetes mellitus with other diabetic ophthalmic complication: Secondary | ICD-10-CM | POA: Diagnosis not present

## 2017-04-29 DIAGNOSIS — M25511 Pain in right shoulder: Secondary | ICD-10-CM

## 2017-04-29 DIAGNOSIS — H42 Glaucoma in diseases classified elsewhere: Secondary | ICD-10-CM

## 2017-04-29 DIAGNOSIS — M25512 Pain in left shoulder: Secondary | ICD-10-CM | POA: Diagnosis present

## 2017-04-29 DIAGNOSIS — H5462 Unqualified visual loss, left eye, normal vision right eye: Secondary | ICD-10-CM | POA: Diagnosis not present

## 2017-04-29 DIAGNOSIS — E559 Vitamin D deficiency, unspecified: Secondary | ICD-10-CM | POA: Diagnosis not present

## 2017-04-29 DIAGNOSIS — E113219 Type 2 diabetes mellitus with mild nonproliferative diabetic retinopathy with macular edema, unspecified eye: Secondary | ICD-10-CM

## 2017-04-29 DIAGNOSIS — E113212 Type 2 diabetes mellitus with mild nonproliferative diabetic retinopathy with macular edema, left eye: Secondary | ICD-10-CM

## 2017-04-29 DIAGNOSIS — H30041 Focal chorioretinal inflammation, macular or paramacular, right eye: Secondary | ICD-10-CM | POA: Diagnosis not present

## 2017-04-29 DIAGNOSIS — Z794 Long term (current) use of insulin: Secondary | ICD-10-CM

## 2017-04-29 DIAGNOSIS — N898 Other specified noninflammatory disorders of vagina: Secondary | ICD-10-CM | POA: Insufficient documentation

## 2017-04-29 DIAGNOSIS — L659 Nonscarring hair loss, unspecified: Secondary | ICD-10-CM | POA: Diagnosis not present

## 2017-04-29 DIAGNOSIS — D509 Iron deficiency anemia, unspecified: Secondary | ICD-10-CM | POA: Diagnosis not present

## 2017-04-29 MED ORDER — FOLIC ACID 1 MG PO TABS: 1.0000 mg | ORAL_TABLET | Freq: Every day | ORAL | 1 refills | Status: DC

## 2017-04-29 MED ORDER — FERROUS SULFATE 325 (65 FE) MG PO TABS: 325.0000 mg | ORAL_TABLET | Freq: Every day | ORAL | 11 refills | Status: DC

## 2017-04-29 MED ORDER — GLUCOSE BLOOD VI STRP: ORAL_STRIP | 5 refills | Status: DC

## 2017-04-29 MED ORDER — VITAMIN C 500 MG PO TABS: 500.0000 mg | ORAL_TABLET | Freq: Every day | ORAL | 3 refills | Status: DC

## 2017-04-29 MED ORDER — VITAMIN D (CHOLECALCIFEROL) 25 MCG (1000 UT) PO CAPS: 1000.0000 mg | ORAL_CAPSULE | Freq: Every day | ORAL | 5 refills | Status: DC

## 2017-04-29 NOTE — Assessment & Plan Note (Signed)
Vit D refilled today. Will plan on checking vit D level at next visit.

## 2017-04-29 NOTE — Progress Notes (Signed)
   CC: shoulder pain  HPI:  [name] is a 47 y.o. 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  Bil shoulder pain: Patient states she stumbled down the stairs about a month ago and landed on outstretched hands; since then she has had bil shoulder pain with left worse than right. She denies arm weakness, numbness, tingling. The pain is slowly improving but it is exacerbated by lifting arms overhead. She has been taking tylenol PRN which does provide relief.   Hair loss: Patient endorses hair loss and brittle hair for about a month. She denies scalp lesions, dandruff, painful scalp. She is consistent with her iron supplementation and recent lab work shows normal hemoglobin. Her albumin is low to 3.3. 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 Her TSH was checked last week and was wnl.  Vaginal discharge: Patient endorses intermittent vaginal discharge; she denies a foul odor, spotting, dysuria, itching.   Please see problem based Assessment and Plan for status of patients chronic conditions.  Past Medical History:  Diagnosis Date  . Anemia, iron deficiency   . Blindness of left eye   . Decreased visual acuity    Left eye  . 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 Hair loss   . History of fracture of clavicle 05/18/2015  . Hyperlipidemia   . Pap smear abnormality of cervix with LGSIL   . Routine/ritual circumcision   . Type II diabetes mellitus ([place])   . Uveitis     Review of Systems:   ROS Per HPI  Physical Exam:  Vitals:   04/29/17 1419  BP: 105/62  Pulse: 83  Temp: 98 F (36.7 C)    TempSrc: Oral  SpO2: 99%  Weight: 213 lb (96.6 kg)   GENERAL- alert, co-operative, appears as stated age, not in any distress. HEENT-  oral mucosa appears moist; scalp w/o lesions, scarring, tenderness; overall appears to have adequate quantity of hair though is dry; no focal areas of hair loss. CARDIAC- radial pulses intact and symmetric. RESP- No increased work of breathing. MSK -  cervical paraspinal muscular tenderness, trapezius and supraspinatus mm tenderness and spasm bilaterally. Generalized shoulder girdle tenderness on left; ROM limited with flexion, internal rotation of left UE due to pain. Strength and sensation intact in bil UEs. SKIN- Warm, dry, no rash or lesion. PSYCH- Normal mood and affect, appropriate thought content and speech.  Assessment & Plan:   See Encounters Tab for problem based charting.   Patient discussed with Dr. Gerrit Friends, MD Internal Medicine PGY2

## 2017-04-29 NOTE — Assessment & Plan Note (Addendum)
Patient endorses hair loss and brittle hair for about a month. She denies scalp lesions, dandruff, painful scalp.   She is consistent with her iron supplementation and recent lab work shows normal hemoglobin. Her albumin is low to 3.3. 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 Her TSH was checked last week and was wnl.  Exam only revealed some thinning in hair and dry hair; there were no scalp lesions or scarring that were appreciated; no focal areas of hair loss.  Plan: --advised patient to increase protein intake --advised to continue iron supplementation --if no improvement, she can discuss changing from methotrexate to another therapy after discussion with her ophthalmologist.

## 2017-04-29 NOTE — Assessment & Plan Note (Signed)
Patient states she stumbled down the stairs about a month ago and landed on outstretched hands; since then she has had bil shoulder pain with left worse than right. She denies arm weakness, numbness, tingling. The pain is slowly improving but it is exacerbated by lifting arms overhead. She has been taking tylenol PRN which does provide relief. XRays of bil shoulders and cervical spine were negative for arthropathy, fracture.   Exam reveals bil cervical paraspinal muscle tenderness, bil trap and supraspinatus muscle tenderness and spasm; ROM limited by pain in R shoulder.   Etiology is likely muscle spasms and possible tendonitis of L rotator cuff related to her fall. Unable to complete full exam of L shoulder due to pain.  Plan: --advised to use heat to treat muscle spasms --continue PRN tylenol --if does not continue to improve, can consider L shoulder u/s and possible corticosteroid injection

## 2017-04-29 NOTE — Patient Instructions (Signed)
For your shoulder pain, use heat 3-4 times a day; you can use as needed tylenol as well. Below are some stretches and exercises you can do that may help.  For your hair, try to increase the amount of protein you eat (beans, lean meats). At your next eye appointment you can talk about possibly switching to a different eye medicine as methotrexate can cause hair loss. Please continue taking methotrexate until you have made a plan with your eye doctor.    Neck Exercises Neck exercises can be important for many reasons:  They can help you to improve and maintain flexibility in your neck. This can be especially important as you age.  They can help to make your neck stronger. This can make movement easier.  They can reduce or prevent neck pain.  They may help your upper back.  Ask your health care provider which neck exercises would be best for you. Exercises Neck Press Repeat this exercise 10 times. Do it first thing in the morning and right before bed or as told by your health care provider. 1. Lie on your back on a firm bed or on the floor with a pillow under your head. 2. Use your neck muscles to push your head down on the pillow and straighten your spine. 3. Hold the position as well as you can. Keep your head facing up and your chin tucked. 4. Slowly count to 5 while holding this position. 5. Relax for a few seconds. Then repeat.  Isometric Strengthening Do a full set of these exercises 2 times a day or as told by your health care provider. 1. Sit in a supportive chair and place your hand on your forehead. 2. Push forward with your head and neck while pushing back with your hand. Hold for 10 seconds. 3. Relax. Then repeat the exercise 3 times. 4. Next, do thesequence again, this time putting your hand against the back of your head. Use your head and neck to push backward against the hand pressure. 5. Finally, do the same exercise on either side of your head, pushing sideways against  the pressure of your hand.  Prone Head Lifts Repeat this exercise 5 times. Do this 2 times a day or as told by your health care provider. 1. Lie face-down, resting on your elbows so that your chest and upper back are raised. 2. Start with your head facing downward, near your chest. Position your chin either on or near your chest. 3. Slowly lift your head upward. Lift until you are looking straight ahead. Then continue lifting your head as far back as you can stretch. 4. Hold your head up for 5 seconds. Then slowly lower it to your starting position.  Supine Head Lifts Repeat this exercise 8-10 times. Do this 2 times a day or as told by your health care provider. 1. Lie on your back, bending your knees to point to the ceiling and keeping your feet flat on the floor. 2. Lift your head slowly off the floor, raising your chin toward your chest. 3. Hold for 5 seconds. 4. Relax and repeat.  Scapular Retraction Repeat this exercise 5 times. Do this 2 times a day or as told by your health care provider. 1. Stand with your arms at your sides. Look straight ahead. 2. Slowly pull both shoulders backward and downward until you feel a stretch between your shoulder blades in your upper back. 3. Hold for 10-30 seconds. 4. Relax and repeat.  Contact a health  care provider if:  Your neck pain or discomfort gets much worse when you do an exercise.  Your neck pain or discomfort does not improve within 2 hours after you exercise. If you have any of these problems, stop exercising right away. Do not do the exercises again unless your health care provider says that you can. Get help right away if:  You develop sudden, severe neck pain. If this happens, stop exercising right away. Do not do the exercises again unless your health care provider says that you can. Exercises Neck Stretch  Repeat this exercise 3-5 times. 1. Do this exercise while standing or while sitting in a chair. 2. Place your feet flat  on the floor, shoulder-width apart. 3. Slowly turn your head to the right. Turn it all the way to the right so you can look over your right shoulder. Do not tilt or tip your head. 4. Hold this position for 10-30 seconds. 5. Slowly turn your head to the left, to look over your left shoulder. 6. Hold this position for 10-30 seconds.  Neck Retraction Repeat this exercise 8-10 times. Do this 3-4 times a day or as told by your health care provider. 1. Do this exercise while standing or while sitting in a sturdy chair. 2. Look straight ahead. Do not bend your neck. 3. Use your fingers to push your chin backward. Do not bend your neck for this movement. Continue to face straight ahead. If you are doing the exercise properly, you will feel a slight sensation in your throat and a stretch at the back of your neck. 4. Hold the stretch for 1-2 seconds. Relax and repeat.  This information is not intended to replace advice given to you by your health care provider. Make sure you discuss any questions you have with your health care provider. Document Released: 03/20/2015 Document Revised: 09/14/2015 Document Reviewed: 10/17/2014 Elsevier Interactive Patient Education  2018 [employer].  Shoulder Exercises Ask your health care provider which exercises are safe for you. Do exercises exactly as told by your health care provider and adjust them as directed. It is normal to feel mild stretching, pulling, tightness, or discomfort as you do these exercises, but you should stop right away if you feel sudden pain or your pain gets worse.Do not begin these exercises until told by your health care provider. RANGE OF MOTION EXERCISES These exercises warm up your muscles and joints and improve the movement and flexibility of your shoulder. These exercises also help to relieve pain, numbness, and tingling. These exercises involve stretching your injured shoulder directly. Exercise A: Pendulum  1. Stand near a wall or a  surface that you can hold onto for balance. 2. Bend at the waist and let your left / right arm hang straight down. Use your other arm to support you. Keep your back straight and do not lock your knees. 3. Relax your left / right arm and shoulder muscles, and move your hips and your trunk so your left / right arm swings freely. Your arm should swing because of the motion of your body, not because you are using your arm or shoulder muscles. 4. Keep moving your body so your arm swings in the following directions, as told by your health care provider: ? Side to side. ? Forward and backward. ? In clockwise and counterclockwise circles. 5. Continue each motion for __________ seconds, or for as long as told by your health care provider. 6. Slowly return to the starting position. Repeat  __________ times. Complete this exercise __________ times a day. Exercise B:Flexion, Standing  1. Stand and hold a broomstick, a cane, or a similar object. Place your hands a little more than shoulder-width apart on the object. Your left / right hand should be palm-up, and your other hand should be palm-down. 2. Keep your elbow straight and keep your shoulder muscles relaxed. Push the stick down with your healthy arm to raise your left / right arm in front of your body, and then over your head until you feel a stretch in your shoulder. ? Avoid shrugging your shoulder while you raise your arm. Keep your shoulder blade tucked down toward the middle of your back. 3. Hold for __________ seconds. 4. Slowly return to the starting position. Repeat __________ times. Complete this exercise __________ times a day. Exercise C: Abduction, Standing 1. Stand and hold a broomstick, a cane, or a similar object. Place your hands a little more than shoulder-width apart on the object. Your left / right hand should be palm-up, and your other hand should be palm-down. 2. While keeping your elbow straight and your shoulder muscles relaxed,  push the stick across your body toward your left / right side. Raise your left / right arm to the side of your body and then over your head until you feel a stretch in your shoulder. ? Do not raise your arm above shoulder height, unless your health care provider tells you to do that. ? Avoid shrugging your shoulder while you raise your arm. Keep your shoulder blade tucked down toward the middle of your back. 3. Hold for __________ seconds. 4. Slowly return to the starting position. Repeat __________ times. Complete this exercise __________ times a day. Exercise D:Internal Rotation  1. Place your left / right hand behind your back, palm-up. 2. Use your other hand to dangle an exercise band, a towel, or a similar object over your shoulder. Grasp the band with your left / right hand so you are holding onto both ends. 3. Gently pull up on the band until you feel a stretch in the front of your left / right shoulder. ? Avoid shrugging your shoulder while you raise your arm. Keep your shoulder blade tucked down toward the middle of your back. 4. Hold for __________ seconds. 5. Release the stretch by letting go of the band and lowering your hands. Repeat __________ times. Complete this exercise __________ times a day. STRETCHING EXERCISES These exercises warm up your muscles and joints and improve the movement and flexibility of your shoulder. These exercises also help to relieve pain, numbness, and tingling. These exercises are done using your healthy shoulder to help stretch the muscles of your injured shoulder. Exercise E: Warehouse manager (External Rotation and Abduction)  1. Stand in a doorway with one of your feet slightly in front of the other. This is called a staggered stance. If you cannot reach your forearms to the door frame, stand facing a corner of a room. 2. Choose one of the following positions as told by your health care provider: ? Place your hands and forearms on the door frame above  your head. ? Place your hands and forearms on the door frame at the height of your head. ? Place your hands on the door frame at the height of your elbows. 3. Slowly move your weight onto your front foot until you feel a stretch across your chest and in the front of your shoulders. Keep your head and chest upright and  keep your abdominal muscles tight. 4. Hold for __________ seconds. 5. To release the stretch, shift your weight to your back foot. Repeat __________ times. Complete this stretch __________ times a day. Exercise F:Extension, Standing 1. Stand and hold a broomstick, a cane, or a similar object behind your back. ? Your hands should be a little wider than shoulder-width apart. ? Your palms should face away from your back. 2. Keeping your elbows straight and keeping your shoulder muscles relaxed, move the stick away from your body until you feel a stretch in your shoulder. ? Avoid shrugging your shoulders while you move the stick. Keep your shoulder blade tucked down toward the middle of your back. 3. Hold for __________ seconds. 4. Slowly return to the starting position. Repeat __________ times. Complete this exercise __________ times a day. STRENGTHENING EXERCISES These exercises build strength and endurance in your shoulder. Endurance is the ability to use your muscles for a long time, even after they get tired. Exercise G:External Rotation  1. Sit in a stable chair without armrests. 2. Secure an exercise band at elbow height on your left / right side. 3. Place a soft object, such as a folded towel or a small pillow, between your left / right upper arm and your body to move your elbow a few inches away (about 10 cm) from your side. 4. Hold the end of the band so it is tight and there is no slack. 5. Keeping your elbow pressed against the soft object, move your left / right forearm out, away from your abdomen. Keep your body steady so only your forearm moves. 6. Hold for  __________ seconds. 7. Slowly return to the starting position. Repeat __________ times. Complete this exercise __________ times a day. Exercise H:Shoulder Abduction  1. Sit in a stable chair without armrests, or stand. 2. Hold a __________ weight in your left / right hand, or hold an exercise band with both hands. 3. Start with your arms straight down and your left / right palm facing in, toward your body. 4. Slowly lift your left / right hand out to your side. Do not lift your hand above shoulder height unless your health care provider tells you that this is safe. ? Keep your arms straight. ? Avoid shrugging your shoulder while you do this movement. Keep your shoulder blade tucked down toward the middle of your back. 5. Hold for __________ seconds. 6. Slowly lower your arm, and return to the starting position. Repeat __________ times. Complete this exercise __________ times a day. Exercise I:Shoulder Extension 1. Sit in a stable chair without armrests, or stand. 2. Secure an exercise band to a stable object in front of you where it is at shoulder height. 3. Hold one end of the exercise band in each hand. Your palms should face each other. 4. Straighten your elbows and lift your hands up to shoulder height. 5. Step back, away from the secured end of the exercise band, until the band is tight and there is no slack. 6. Squeeze your shoulder blades together as you pull your hands down to the sides of your thighs. Stop when your hands are straight down by your sides. Do not let your hands go behind your body. 7. Hold for __________ seconds. 8. Slowly return to the starting position. Repeat __________ times. Complete this exercise __________ times a day. Exercise J:Standing Shoulder Row 1. Sit in a stable chair without armrests, or stand. 2. Secure an exercise band to a stable object  in front of you so it is at waist height. 3. Hold one end of the exercise band in each hand. Your palms  should be in a thumbs-up position. 4. Bend each of your elbows to an "L" shape (about 90 degrees) and keep your upper arms at your sides. 5. Step back until the band is tight and there is no slack. 6. Slowly pull your elbows back behind you. 7. Hold for __________ seconds. 8. Slowly return to the starting position. Repeat __________ times. Complete this exercise __________ times a day. Exercise K:Shoulder Press-Ups  1. Sit in a stable chair that has armrests. Sit upright, with your feet flat on the floor. 2. Put your hands on the armrests so your elbows are bent and your fingers are pointing forward. Your hands should be about even with the sides of your body. 3. Push down on the armrests and use your arms to lift yourself off of the chair. Straighten your elbows and lift yourself up as much as you comfortably can. ? Move your shoulder blades down, and avoid letting your shoulders move up toward your ears. ? Keep your feet on the ground. As you get stronger, your feet should support less of your body weight as you lift yourself up. 4. Hold for __________ seconds. 5. Slowly lower yourself back into the chair. Repeat __________ times. Complete this exercise __________ times a day. Exercise L: Wall Push-Ups  1. Stand so you are facing a stable wall. Your feet should be about one arm-length away from the wall. 2. Lean forward and place your palms on the wall at shoulder height. 3. Keep your feet flat on the floor as you bend your elbows and lean forward toward the wall. 4. Hold for __________ seconds. 5. Straighten your elbows to push yourself back to the starting position. Repeat __________ times. Complete this exercise __________ times a day. This information is not intended to replace advice given to you by your health care provider. Make sure you discuss any questions you have with your health care provider. Document Released: 02/20/2005 Document Revised: 01/01/2016 Document Reviewed:  12/18/2014 Elsevier Interactive Patient Education  2018 [employer].

## 2017-04-29 NOTE — Assessment & Plan Note (Signed)
Refilled iron supplement. Hgb last week was wnl - 12.0.

## 2017-04-29 NOTE — Assessment & Plan Note (Signed)
Patient endorses intermittent vaginal discharge; she denies a foul odor, spotting, dysuria, itching.  Patient is due for screening pap smear. She declined pap smear and wet prep today.  Plan --will plan on pap smear on follow up and wet prep if still symptomatic.

## 2017-05-06 NOTE — Progress Notes (Signed)
Internal Medicine Clinic Attending  Case discussed with Dr. Svalina  at the time of the visit.  We reviewed the resident's history and exam and pertinent patient test results.  I agree with the assessment, diagnosis, and plan of care documented in the resident's note.  

## 2017-05-17 ENCOUNTER — Encounter (HOSPITAL_COMMUNITY): Payer: Self-pay | Admitting: Emergency Medicine

## 2017-05-17 ENCOUNTER — Other Ambulatory Visit: Payer: Self-pay

## 2017-05-17 ENCOUNTER — Emergency Department (HOSPITAL_COMMUNITY)
Admission: EM | Admit: 2017-05-17 | Discharge: 2017-05-17 | Disposition: A | Discharge status: Home / Self Care | Payer: Medicaid Other | Attending: Emergency Medicine | Admitting: Emergency Medicine

## 2017-05-17 DIAGNOSIS — Z79899 Other long term (current) drug therapy: Secondary | ICD-10-CM | POA: Insufficient documentation

## 2017-05-17 DIAGNOSIS — E119 Type 2 diabetes mellitus without complications: Secondary | ICD-10-CM | POA: Diagnosis not present

## 2017-05-17 DIAGNOSIS — K0889 Other specified disorders of teeth and supporting structures: Secondary | ICD-10-CM | POA: Diagnosis not present

## 2017-05-17 DIAGNOSIS — Z794 Long term (current) use of insulin: Secondary | ICD-10-CM | POA: Diagnosis not present

## 2017-05-17 MED ORDER — TRAMADOL HCL 50 MG PO TABS
50.0000 mg | ORAL_TABLET | Freq: Once | ORAL | Status: AC
  Administered 2017-05-17: 50 mg via ORAL
  Filled 2017-05-17: qty 1

## 2017-05-17 NOTE — ED Notes (Signed)
Pt stable, ambulatory, and verbalizes understanding d/c instructions.

## 2017-05-17 NOTE — Discharge Instructions (Addendum)
You were seen in the Emergency Department for facial pain which may be due to your loose teeth.  Please call for an appointment at 8 AM on Monday so that your symptoms can be addressed.  [place] [phone] [place], [place] [address]    Here is a list of other dentists covered by medicaid:  These dentists all accept Medicaid.  The list is a courtesy and for your convenience. Estos dentistas aceptan Medicaid.  La lista es para su [place] y es una cortesa.     [facility]     [phone] [place] [address] Se habla espaol From 74 to 77 years old Parent may go with child only for cleaning Anette Riedel DDS     [place], [place] ([place] speaking) [address] [place]  [address] Se habla espaol From 15 to 64 years old Parent may go with child   Rolene Arbour DMD    [phone] [place] [address] Se habla espaol Vietnamese spoken From 15 years old Parent may go with child [facility]     [phone] [place] [address] Se habla espaol From 43 to 58 years old Parent may NOT go with child  Marcelo Baldy DDS     [phone] [facility]     [address]  [place] [address] [place] spoken (preferred to bring translator) From teeth coming in to 57 years old Parent may go with child  [facility]     [phone] [address] [place]. [place] [address] Requires certification. Call for information. Requiere certificacin. Llame para informacin. Algunos dias se habla espaol  From birth to 16 years Parent possibly goes with child   Kandice Hams DDS     [address]  [address] [place] [address] Se habla espaol From 18 months to 18 years  Parent may go with child  J. [place] DDS    [place] DDS [address] [place] [address] Se habla espaol From 46 year old Parent may go with child   Shelton Silvas DDS    [phone] [address] [place] [address] Se habla espaol  From 81 months to 51 years old Parent may go with child Ivory Broad DDS    [phone] [address] [place] [address] Se habla espaol From 91 to 45 years old Parent may go with child  [facility]    [phone] [address] [place] [address] No se habla espaol From birth  [place], [place]     [place].  [place], [place] [address] From 46 years old   Special needs children welcome  [facility]  [phone] [address] [place] [address] Se habla espanol Interpretation for other languages Special needs children welcome  [facility]   [phone] Dr. Janeice Robinson [address] [place], [place] [address] Se habla espaol From birth to 24 years Special needs children welcome

## 2017-05-17 NOTE — ED Provider Notes (Signed)
[facility] EMERGENCY DEPARTMENT Provider Note   CSN: [id number] Arrival date & time: 05/17/17  0706     History   Chief Complaint Chief Complaint  Patient presents with  . Dental Pain  . Headache    HPI Jennifer [name] is a 47 y.o. female with T2DM, HLD blindness of left eye presenting for dental pain and facial pain that started yesterday at 4 PM. She endorses facial pain next to her maxillary and ethmoid sinuses, and bilateral TM joints, and her gums. She additionally endorses having two loose incisors. No rhinorrhea or congestion. No sore throat or ear pain. No chest pain, wheezing or cough. No N/V/D/C.   Past Medical History:  Diagnosis Date  . Anemia, iron deficiency   . Blindness of left eye   . Decreased visual acuity    Left eye  . 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 Hair loss   . History of fracture of clavicle 05/18/2015  . Hyperlipidemia   . Pap smear abnormality of cervix with LGSIL   . Routine/ritual circumcision   . Type II diabetes mellitus ([place])   . Uveitis     Patient Active Problem List   Diagnosis Date Noted  . Bilateral shoulder pain 04/29/2017  . Vaginal discharge 04/29/2017  . Tinea 01/20/2017  . Elevated blood pressure reading 07/23/2016  . 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 Fatigue 04/02/2016  . Bilateral lower extremity edema 02/08/2016  . 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 Vitamin D deficiency 10/30/2014  . Iron deficiency anemia 05/13/2013  . Hair loss 03/24/2012  . Healthcare maintenance 02/26/2011  . Hyperlipidemia 05/05/2006  . 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 CESAREAN SECTION      OB History    Gravida Para Term  Preterm AB Living   '7 4 4   3 4   ' SAB TAB Ectopic Multiple Live Births   3              Home Medications    Prior to Admission medications   Medication Sig Start Date End Date Taking? Authorizing Provider  atorvastatin (LIPITOR) 40 MG tablet Take 1 tablet (40 mg total) by mouth daily. 11/13/16   Alphonzo Grieve, MD  atropine 1 % ophthalmic solution Place 1 drop into both eyes daily.     [provider]  Blood Glucose Monitoring Suppl (ACCU-CHEK NANO SMARTVIEW) w/Device KIT 1 kit 01/20/17   Burgess Estelle, MD  clotrimazole (LOTRIMIN) 1 % cream Apply 1 application topically 2 (two) times daily. For 2 weeks. Return to clinic in 2 weeks around 10/15 for assessment 01/20/17   Burgess Estelle, MD  dorzolamide-timolol (COSOPT) 22.3-6.8 MG/ML ophthalmic solution Place 1 drop into the left eye 2 (two) times daily.    [provider]  empagliflozin (JARDIANCE) 10 MG TABS tablet Take 10 mg by mouth daily. 03/19/17   Maryellen Pile, MD  ferrous sulfate 325 (65 FE) MG tablet Take 1 tablet (325 mg total) by mouth daily. 04/29/17 04/29/18  Alphonzo Grieve, MD  folic acid (FOLVITE) 1 MG tablet Take 1 tablet (1 mg total) by mouth daily. 04/29/17   Alphonzo Grieve, MD  glucose blood (ACCU-CHEK SMARTVIEW) test strip CHECK BLOOD SUGAR 4 TIMES DAILY  BEFORE MEALS AND AT BEDTIME 04/29/17   Alphonzo Grieve, MD  ibuprofen (ADVIL,MOTRIN) 400 MG tablet Take 1 tablet (400 mg total) by mouth every 6 (six) hours as needed. 04/23/17   Dorie Rank, MD  Insulin NPH, Human,, Isophane, (HUMULIN N KWIKPEN) 100 UNIT/ML Kiwkpen INJECT 45 UNITS SUBCUTANEOUSLY EVERY MORNING AND 20 UNITS EVERY EVENING 03/19/17   Alphonzo Grieve, MD  Insulin Pen Needle 31G X 5 MM MISC 1 Dose by Does not apply route 2 (two) times daily. 11/13/16   Alphonzo Grieve, MD  ketorolac (ACULAR) 0.5 % ophthalmic solution Place 1 drop into both eyes 4 (four) times daily.    [provider]  Lancets (ACCU-CHEK SOFT TOUCH) lancets Check Blood Sugar 4  times daily before meals and at bedtime. Dx code: E11.321 03/20/17   Alphonzo Grieve, MD  metFORMIN (GLUCOPHAGE) 1000 MG tablet Take 1 tablet (1,000 mg total) by mouth 2 (two) times daily with a meal. 02/21/17   Maryellen Pile, MD  methotrexate (RHEUMATREX) 2.5 MG tablet Take 15 mg by mouth once a week. 04/23/16   [provider]  prednisoLONE acetate (PRED FORTE) 1 % ophthalmic suspension Place 1 drop into the right eye 2 (two) times daily.     [provider]  vitamin C (ASCORBIC ACID) 500 MG tablet Take 1 tablet (500 mg total) by mouth daily. 04/29/17   Alphonzo Grieve, MD  Vitamin D, Cholecalciferol, 1000 units CAPS Take 1,000 mg by mouth daily with breakfast. 04/29/17   Alphonzo Grieve, MD    Family History No family history on file.  Social History Social History   Tobacco Use  . Smoking status: Never Smoker  . Smokeless tobacco: Never Used  Substance Use Topics  . Alcohol use: No    Alcohol/week: 0.0 oz  . Drug use: No     Allergies   Patient has no known allergies.   Review of Systems Review of Systems See HPI For ROS  Physical Exam Updated Vital Signs BP (!) 157/94 (BP Location: Right Arm)   Pulse 84   Resp 18   Ht '5\' 4"'  (1.626 m)   Wt 96.6 kg (213 lb)   SpO2 100%   BMI 36.56 kg/m   Physical Exam  Constitutional: She is oriented to person, place, and time. She appears well-developed and well-nourished.  HENT:  Head: Normocephalic and atraumatic.  +no bottom teeth. +two loose incisors and notable dental caries. No abscess or drainage noted in the mouth.  Eyes: EOM are normal. Pupils are equal, round, and reactive to light.  Neck: Normal range of motion. Neck supple.  Cardiovascular: Normal rate and regular rhythm. Exam reveals no gallop and no friction rub.  No murmur heard. Pulmonary/Chest: Effort normal and breath sounds normal. She has no wheezes. She has no rales.  Abdominal: Soft. She exhibits no distension. There is no tenderness.    Musculoskeletal: Normal range of motion.  Neurological: She is alert and oriented to person, place, and time.  Skin: Skin is warm and dry.     ED Treatments / Results  Labs (all labs ordered are listed, but only abnormal results are displayed) Labs Reviewed - No data to display  EKG  EKG Interpretation None       Radiology No results found.  Procedures Procedures (including critical care time)  Medications Ordered in ED Medications  traMADol (ULTRAM) tablet 50 mg (50 mg Oral Given 05/17/17 0902)     Initial Impression / Assessment and Plan / ED Course  I  have reviewed the triage vital signs and the nursing notes.  Pertinent labs & imaging results that were available during my care of the patient were reviewed by me and considered in my medical decision making (see chart for details).    Face and dental pain is most consistent with dental etiology. There are no red flags to suggest infection or abscess requiring antibiotics at this time. Tramadol give in ED, and OTC pain meds may be used at home. Patient was given a list of medicaid covered dentists as well as the dentist on call today and asked to call for an appointment on Monday.  Return precautions provided. Stable for discharge.  Final Clinical Impressions(s) / ED Diagnoses   Final diagnoses:  Pain, dental    ED Discharge Orders    None       Everrett Coombe, MD 05/17/17 1552    Carmin Muskrat, MD 05/18/17 [phone]

## 2017-05-17 NOTE — ED Triage Notes (Signed)
Translator/patient stated, she has dental pain and headache for 2 days.

## 2017-05-17 NOTE — ED Notes (Signed)
ED Provider at bedside. 

## 2017-06-03 NOTE — Progress Notes (Deleted)
   CC: ***  HPI:  Ms.Durinda Rudge is a 47 y.o. with a PMH of ***  Please see problem based Assessment and Plan for status of patients chronic conditions.  Past Medical History:  Diagnosis Date  . Anemia, iron deficiency   . Blindness of left eye   . Decreased visual acuity    Left eye  . 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 Hair loss   . History of fracture of clavicle 05/18/2015  . Hyperlipidemia   . Pap smear abnormality of cervix with LGSIL   . Routine/ritual circumcision   . Type II diabetes mellitus ([place])   . Uveitis     Review of Systems:   ROS  Physical Exam:  There were no vitals filed for this visit. GENERAL- alert, co-operative, appears as stated age, not in any distress. HEENT- Atraumatic, normocephalic, PERRL, EOMI, oral mucosa appears moist CARDIAC- RRR, no murmurs, rubs or gallops. RESP- Moving equal volumes of air, and clear to auscultation bilaterally, no wheezes or crackles. ABDOMEN- Soft, nontender, bowel sounds present. NEURO- No obvious Cr N abnormality. EXTREMITIES- pulse 2+, symmetric, no pedal edema. SKIN- Warm, dry, No rash or lesion. PSYCH- Normal mood and affect, appropriate thought content and speech.  Assessment & Plan:   See Encounters Tab for problem based charting.   Patient {GC/GE:[id number]::"discussed with","seen with"} Dr. {NAMES:[id number]::"Butcher","Granfortuna","E. Hoffman","Klima","Mullen","Narendra","Vincent"}   Alphonzo Grieve, MD Internal Medicine PGY2

## 2017-06-04 ENCOUNTER — Encounter: Payer: Medicaid Other | Admitting: Internal Medicine

## 2017-06-04 ENCOUNTER — Encounter: Payer: Self-pay | Admitting: Internal Medicine

## 2017-06-17 ENCOUNTER — Encounter: Payer: Self-pay | Admitting: Internal Medicine

## 2017-06-17 ENCOUNTER — Ambulatory Visit (INDEPENDENT_AMBULATORY_CARE_PROVIDER_SITE_OTHER): Payer: Medicaid Other | Admitting: Internal Medicine

## 2017-06-17 ENCOUNTER — Other Ambulatory Visit: Payer: Self-pay

## 2017-06-17 VITALS — BP 108/61 | HR 91 | Temp 97.9°F | Ht 66.0 in | Wt 210.8 lb

## 2017-06-17 DIAGNOSIS — H409 Unspecified glaucoma: Secondary | ICD-10-CM

## 2017-06-17 DIAGNOSIS — Z794 Long term (current) use of insulin: Secondary | ICD-10-CM

## 2017-06-17 DIAGNOSIS — E113219 Type 2 diabetes mellitus with mild nonproliferative diabetic retinopathy with macular edema, unspecified eye: Secondary | ICD-10-CM

## 2017-06-17 DIAGNOSIS — D509 Iron deficiency anemia, unspecified: Secondary | ICD-10-CM

## 2017-06-17 DIAGNOSIS — M25512 Pain in left shoulder: Secondary | ICD-10-CM | POA: Diagnosis not present

## 2017-06-17 DIAGNOSIS — Z7984 Long term (current) use of oral hypoglycemic drugs: Secondary | ICD-10-CM

## 2017-06-17 DIAGNOSIS — M25511 Pain in right shoulder: Secondary | ICD-10-CM | POA: Diagnosis not present

## 2017-06-17 DIAGNOSIS — E559 Vitamin D deficiency, unspecified: Secondary | ICD-10-CM | POA: Diagnosis not present

## 2017-06-17 DIAGNOSIS — H2010 Chronic iridocyclitis, unspecified eye: Secondary | ICD-10-CM

## 2017-06-17 DIAGNOSIS — E785 Hyperlipidemia, unspecified: Secondary | ICD-10-CM | POA: Diagnosis not present

## 2017-06-17 DIAGNOSIS — H42 Glaucoma in diseases classified elsewhere: Secondary | ICD-10-CM | POA: Diagnosis not present

## 2017-06-17 DIAGNOSIS — E1139 Type 2 diabetes mellitus with other diabetic ophthalmic complication: Secondary | ICD-10-CM | POA: Diagnosis not present

## 2017-06-17 DIAGNOSIS — M25532 Pain in left wrist: Secondary | ICD-10-CM

## 2017-06-17 MED ORDER — CYCLOBENZAPRINE HCL 5 MG PO TABS: 5.0000 mg | ORAL_TABLET | ORAL | 0 refills | Status: DC | PRN

## 2017-06-17 NOTE — Progress Notes (Signed)
 CC: left shoulder pain   HPI:  [name] is a 47 y.o. with PMH hyperlipidemia, type 2 diabetes mellitus with retinopathy, chronic anterior uveitis and glaucoma, iron deficiency anemia, vitamin D deficiency,  who presents for left shoulder pain. Please see the assessment and plans for the status of the patient chronic medical problems.   Past Medical History:  Diagnosis Date  . Anemia, iron deficiency   . Blindness of left eye   . Decreased visual acuity    Left eye  . 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 Hair loss   . History of fracture of clavicle 05/18/2015  . Hyperlipidemia   . Pap smear abnormality of cervix with LGSIL   . Routine/ritual circumcision   . Type II diabetes mellitus ([facility])   . Uveitis    Review of Systems:  Refer to history of present illness and assessment and plans for pertinent review of systems, all others reviewed and negative   Physical Exam:  Vitals:   06/17/17 1558  BP: 108/61  Pulse: 91  Temp: 97.9 F (36.6 C)  TempSrc: Oral  SpO2: 100%  Weight: 210 lb 12.8 oz (95.6 kg)  Height: 5' 6" (1.676 m)   General: appears uncomfortable HEENT: squinted eyes in discomfort  Musculoskeletal: she is guarding the left arm, pain limits active movement of the shoulder and she is resistant to passive elevation or movement of the arm. The shoulder is not warm or swollen, the shoulder is tender to palpation at the AC joint and subacromial space. Tense and tender muscle spasm of the left trapezius. No tenderness to palpation over the medial or lateral epicondyle of the elbow  Ultrasound exam: there is a very small bursa visible under the supraspinatus which would be difficult to drain, the biceps, infraspinatus, supraspinatus, and subscapularis tendons are without visible tear or change in texture.  Skin: no rashes on the  flexure surfaces of the hands  Neuro: strength 5/5 in the right arm, and bilateral hip flexors   Assessment & Plan:   Left shoulder pain  Burning and throbbing left neck, shoulder, arm, elbow, and wrist pain. The wrist pain causes a tingling shooting pain in her hand. The pain has been going on for months and has become progressively worse over the past few weeks. With the pain progressing she now also has weakness in the left arm. She denies other joint or msk involvement. 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 Xray of the cervical spine earlier this month showed no acute abnormality, the neural foramen were patent. Xray of the shoulder showed no degenerative disease. She denies trauma or falls.  This pain could be multifactorial, the characteristics of it seem like a nerve impingement at the level of the cervical spine but she had a cervical spine xray with no degenerative change. On exam she has a muscle spasm of the trapezius and ultrasound of the shoulder shows intact rotator cuff tendons without obvious inflammation of the tendons but a small bursa is visible under the supraspinatus. Unfortunately with the diabetes being uncontrolled at this time there would be a high risk of uncontrolled hyperglycemia with a steroid injection so I will attempt to treat conservatively and order labs to evaluate for other possible causes.  - prescribed flexeril to be used only as needed for sleep  -   asparcreme if diclofenac is not affordable  - ESR and CK today   ESR is elevated, CK is not - ESR elevation with a normal CK can be seen in polymyalgia rheumatic, conditions of myositis could also be associated with elevated ESR but there should be an elevation in CK as well. The aching and stiffness of polymyalgia can start unilateral and then progress to bilateral. The symptoms of carpal tunnel and bursitis can also be associated with  polymyalgia.   The progressive worsening of symptoms is typical of polymyositis but she does not have the expected proximal muscle weakness and muscle pain is usually the milder feature in polymyositis. The normal CK would be unusual of polymyositis but there could be other muscle enzymes that are elevated instead such as aldolase, lactate dehydrogenase, AST and ALT - she did have a normal CMET last month when these symptoms were starting.  - Start prednisone 12.5 mg daily Monday and return to clinic on Thursday, the pain should be drastically improved by this time if it is PMR  - A1c is elevated to 14 today ( see diabetes on problem list)   Unfortunately, I have not been able to reach Ms. Laplante to communicate this recommendation, the home phone goes straight to voicemail. I have left an unidentified voicemail and asked that she called the clinic back. I will wait to call in the prednisone until the clinic has heard back from her.   Diabetes mellitus  A1c 14 today. She does not have her blood glucose meter with her today but denies hypoglycemic symptoms or readings. Presentation today is worrisome for PMR and a need to start a trial of prednisone so I will ask or clinic pharmacist and nutritionist to help with blood glucose control through this course.  - continue metformin 2000 mg daily, jardiance 10 mg daily - increase NPH 47 units qAM 22 units qPM ( previously 45 units qAM, 20 units qPM )  - referral to diabetes management and clinic pharmacist   See Encounters Tab for problem based charting.  Patient seen with Dr. Angelia Mould

## 2017-06-17 NOTE — Patient Instructions (Addendum)
Thank you for coming to the clinic today. It was a pleasure to see you.   For your shoulder pain - try using asparcreme which is an over the counter medication. Use the flexeril as needed for sleep   FOLLOW-UP INSTRUCTIONS When: march 13 with Dr. Jari Favre  For: a check up and follow up of your shoulder pain  What to bring: all of your medication bottles   Please call our clinic if you have any questions or concerns, we may be able to help and keep you from a long and expensive emergency room wait. Our clinic and after hours phone number is [phone], there is always someone available.

## 2017-06-19 ENCOUNTER — Other Ambulatory Visit (INDEPENDENT_AMBULATORY_CARE_PROVIDER_SITE_OTHER): Payer: Medicaid Other

## 2017-06-19 DIAGNOSIS — M25512 Pain in left shoulder: Principal | ICD-10-CM

## 2017-06-19 DIAGNOSIS — E11311 Type 2 diabetes mellitus with unspecified diabetic retinopathy with macular edema: Secondary | ICD-10-CM

## 2017-06-19 DIAGNOSIS — M25511 Pain in right shoulder: Secondary | ICD-10-CM

## 2017-06-20 ENCOUNTER — Telehealth: Payer: Self-pay | Admitting: Internal Medicine

## 2017-06-20 ENCOUNTER — Encounter: Payer: Self-pay | Admitting: Internal Medicine

## 2017-06-20 LAB — SEDIMENTATION RATE: Sed Rate: 74 mm/hr — ABNORMAL HIGH (ref 0–32)

## 2017-06-20 LAB — HEMOGLOBIN A1C
Est. average glucose Bld gHb Est-mCnc: 355 mg/dL
HEMOGLOBIN A1C: 14 % — AB (ref 4.8–5.6)

## 2017-06-20 LAB — CK: Total CK: 55 U/L (ref 24–173)

## 2017-06-20 MED ORDER — DICLOFENAC SODIUM 1 % TD GEL: 2.0000 g | Freq: Four times a day (QID) | TRANSDERMAL | 0 refills | Status: DC

## 2017-06-20 MED ORDER — INSULIN ISOPHANE HUMAN 100 UNIT/ML KWIKPEN: PEN_INJECTOR | SUBCUTANEOUS | 1 refills | Status: DC

## 2017-06-20 NOTE — Assessment & Plan Note (Addendum)
Burning and throbbing left neck, shoulder, arm, elbow, and wrist pain. The wrist pain causes a tingling shooting pain in her hand. The pain has been going on for months and has become progressively worse over the past few weeks. With the pain progressing she now also has weakness in the left arm. She denies other joint or msk involvement. 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 Xray of the cervical spine earlier this month showed no acute abnormality, the neural foramen were patent. Xray of the shoulder showed no degenerative disease. She denies trauma or falls.  This pain could be multifactorial, the characteristics of it seem like a nerve impingement at the level of the cervical spine but she had a cervical spine xray with no degenerative change. On exam she has a muscle spasm of the trapezius and ultrasound of the shoulder shows intact rotator cuff tendons without obvious inflammation of the tendons but a small bursa is visible under the supraspinatus. Unfortunately with the diabetes being uncontrolled at this time there would be a high risk of uncontrolled hyperglycemia with a steroid injection so I will attempt to treat conservatively and order labs to evaluate for other possible causes.  - prescribed flexeril to be used only as needed for sleep  - asparcreme if diclofenac is not affordable  - ESR and CK today   ESR is elevated, CK is not - ESR elevation with a normal CK can be seen in polymyalgia rheumatic, conditions of myositis could also be associated with elevated ESR but there should be an elevation in CK as well. The aching and stiffness of polymyalgia can start unilateral and then progress to bilateral. The symptoms of carpal tunnel and bursitis can also be associated with polymyalgia.   The progressive worsening of symptoms is typical of polymyositis but she does not have the expected proximal muscle weakness and  muscle pain is usually the milder feature in polymyositis. The normal CK would be unusual of polymyositis but there could be other muscle enzymes that are elevated instead such as aldolase, lactate dehydrogenase, AST and ALT - she did have a normal CMET last month when these symptoms were starting.  - Start prednisone 12.5 mg daily Monday and return to clinic on Thursday, the pain should be drastically improved by this time if it is PMR  - A1c is elevated to 14 today ( see diabetes on problem list)   Unfortunately, I have not been able to reach Jennifer [name] to communicate this recommendation, the home phone goes straight to voicemail. I have left an unidentified voicemail and asked that she called the clinic back. I will wait to call in the prednisone until the clinic has heard back from her.

## 2017-06-20 NOTE — Assessment & Plan Note (Addendum)
A1c 14 today. She does not have her blood glucose meter with her today but denies hypoglycemic symptoms or readings. Presentation today is worrisome for PMR and a need to start a trial of prednisone so I will ask or clinic pharmacist and nutritionist to help with blood glucose control through this course.  - continue metformin 2000 mg daily, jardiance 10 mg daily - increase NPH 47 units qAM 22 units qPM ( previously 45 units qAM, 20 units qPM )  - referral to diabetes management and clinic pharmacist

## 2017-06-20 NOTE — Telephone Encounter (Signed)
I have not been able to reach Jennifer [name] to communicate abnormal lab result and recommendations mentioned in my last office visit. The home phone goes straight to voicemail. I have left an unidentified voicemail and asked that she called the clinic back. I will wait to call in the prednisone until the clinic has heard back from her.

## 2017-06-23 NOTE — Progress Notes (Signed)
Internal Medicine Clinic Attending  I saw and evaluated the patient.  I personally confirmed the key portions of the history and exam documented by Dr. Blum and I reviewed pertinent patient test results.  The assessment, diagnosis, and plan were formulated together and I agree with the documentation in the resident's note. 

## 2017-06-28 NOTE — Progress Notes (Signed)
   CC: left arm pain  HPI:  Jennifer [name] is a 47 y.o. with a PMH of HLD, uncontrolled T2DM, with retinopathy, chronic anterior uveitis and glaucoma, Fe def anemia, Vit D deficiency,   T2DM: A1c two weeks ago at 69; she was on metformin 207m daily, jardiance 129mdaily, and her NPH was increased to 47 units qAM and 22 units qPM; however she states she takes 45 units in the morning and 35 units in the evening; she states her CBGs are better, usually ~100 however she has been out of strips for some time. Her daughter is unsure if she is taking jardiance, however states she has some prescriptions she needs to pick up from the pharmacy.  Left shoulder pain: ESR elevated, normal CK; initially not thought to be candidate for steroid injection due to DM; however concern for polymyalgia rheumatica and Dr. BlHetty Elyanted to start prednisone 12.34m80maily with close f/u however patient was unable to be reached.  Please see problem based Assessment and Plan for status of patients chronic conditions.  Past Medical History:  Diagnosis Date  . Anemia, iron deficiency   . Blindness of left eye   . Decreased visual acuity    Left eye  . 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 Hair loss   . History of fracture of clavicle 05/18/2015  . Hyperlipidemia   . Pap smear abnormality of cervix with LGSIL   . Routine/ritual circumcision   . Type II diabetes mellitus ([place] Uveitis     Review of Systems:   ROS  Physical Exam:  Vitals:   07/02/17 1424  BP: 117/70  Pulse: 84  Temp: 98.4 F (36.9 C)  TempSrc: Oral  SpO2: 97%  Weight: 209 lb (94.8 kg)  Height: _0  (1.676 m)   GENERAL- alert, co-operative, appears as stated age, not in any distress. CARDIAC- RRR, no murmurs, rubs or gallops. RESP- Moving equal volumes of air, and clear to auscultation  bilaterally, no wheezes or crackles. ABDOMEN- Soft, nontender, bowel sounds present. EXTREMITIES- pulse 2+, symmetric, no pedal edema. Tenderness and spasm of left trapezius mm. Point tenderness to anterior shoulder; pain with flexion at elbow; internal rotation, abduction, and flexion at shoulder limited by pain. Unable to fully assess passive ROM. Strength, sensation and pulses intact bil UEs. SKIN- Warm, dry, no rash or lesion. PSYCH- Normal mood and affect, appropriate thought content and speech.  Assessment & Plan:   See Encounters Tab for problem based charting.   Patient seen with Dr. VinNilsa NuttingD Internal Medicine PGY2

## 2017-07-02 ENCOUNTER — Other Ambulatory Visit: Payer: Self-pay

## 2017-07-02 ENCOUNTER — Ambulatory Visit: Payer: Medicaid Other | Admitting: Internal Medicine

## 2017-07-02 ENCOUNTER — Encounter: Payer: Self-pay | Admitting: Internal Medicine

## 2017-07-02 ENCOUNTER — Telehealth: Payer: Self-pay

## 2017-07-02 DIAGNOSIS — M7522 Bicipital tendinitis, left shoulder: Secondary | ICD-10-CM

## 2017-07-02 DIAGNOSIS — Z794 Long term (current) use of insulin: Secondary | ICD-10-CM | POA: Diagnosis not present

## 2017-07-02 DIAGNOSIS — E1139 Type 2 diabetes mellitus with other diabetic ophthalmic complication: Secondary | ICD-10-CM

## 2017-07-02 DIAGNOSIS — E559 Vitamin D deficiency, unspecified: Secondary | ICD-10-CM

## 2017-07-02 DIAGNOSIS — D509 Iron deficiency anemia, unspecified: Secondary | ICD-10-CM

## 2017-07-02 DIAGNOSIS — E785 Hyperlipidemia, unspecified: Secondary | ICD-10-CM | POA: Diagnosis not present

## 2017-07-02 DIAGNOSIS — H2010 Chronic iridocyclitis, unspecified eye: Secondary | ICD-10-CM

## 2017-07-02 DIAGNOSIS — E113212 Type 2 diabetes mellitus with mild nonproliferative diabetic retinopathy with macular edema, left eye: Secondary | ICD-10-CM | POA: Diagnosis not present

## 2017-07-02 DIAGNOSIS — M25512 Pain in left shoulder: Secondary | ICD-10-CM

## 2017-07-02 DIAGNOSIS — H42 Glaucoma in diseases classified elsewhere: Secondary | ICD-10-CM | POA: Diagnosis not present

## 2017-07-02 MED ORDER — GLUCOSE BLOOD VI STRP: ORAL_STRIP | 5 refills | Status: DC

## 2017-07-02 MED ORDER — EMPAGLIFLOZIN 10 MG PO TABS: 10.0000 mg | ORAL_TABLET | Freq: Every day | ORAL | 2 refills | Status: DC

## 2017-07-02 NOTE — Patient Instructions (Addendum)
For the next few days increase your insulin to 45 units twice a day. Check your sugars 4 times a day, if you notice low sugars, please give our clinic a call. Try to eat regular meals throughout the day.  You may have mild increased pain in your shoulder for the next day or so, this is normal; if the pain is persistent, you notice swelling, redness or fevers, please call our clinic and we will see you.  Please come back in one month for your diabetes with your glucose meter.

## 2017-07-02 NOTE — Progress Notes (Signed)
Shoulder Injection Procedure Note  Pre-operative Diagnosis: left biceps tendonitis  Indications: symptomatic relief of tendonitis  Anesthesia:   Procedure Details   Point of care ultrasound was used to identify the subacromial bursa and evaluate for rotator cuff tendinopathy or tears. The biceps tendon was observed to have evidence of fluid within tendon sheath consistent with tendonitis. Consent was obtained for the procedure. The shoulder was prepped with iodine. Using a 21 gauge needle the subacromial bursa was injected with 2 mL 1% lidocaine and 80ml of triamcinolone (KENALOG) 40mg /ml. The needle was removed and a dressing was applied.  Complications:  None; patient tolerated the procedure well.  Procedure was supervised directly by Dr. Evette Doffing.  Alphonzo Grieve, MD IMTS - PGY2

## 2017-07-02 NOTE — Telephone Encounter (Signed)
Called [facility]. Patient has not picked up her Jardiance since 02/2017.

## 2017-07-06 ENCOUNTER — Encounter: Payer: Self-pay | Admitting: Internal Medicine

## 2017-07-06 DIAGNOSIS — M25512 Pain in left shoulder: Secondary | ICD-10-CM

## 2017-07-06 HISTORY — DX: Pain in left shoulder: M25.512

## 2017-07-06 NOTE — Assessment & Plan Note (Addendum)
Patient endorses taking Humulin 45 units qAM and 25 units qPM. She is overall poorly controlled. In setting of corticosteroid injection, she was recommended to increase Humulin to 45 units BID for a few days with close monitoring of her CBGs. She was advised to call clinic if significant hyper or hypoglycemia were to occur. She was in agreement with this.  I refilled her Jardiance.  Patient will f/u in 1 month for her DM; she is to bring her glucometer with her.

## 2017-07-06 NOTE — Assessment & Plan Note (Addendum)
Patient experienced fall in Jan, after which she was seen in ED and had XR neg for fracture. Following fall she had bil shoulder pain R>L initially which was relieved with tylenol. She was seen a couple of week ago for left shoulder pain which she states started mid Feb with diffuse tenderness. At that visit ESR and CRP were checked; ESR was elevated and CK was normal. There was initially concern for PMR, however I think her ESR could be elevated due to chronic uveitis, otherwise her history and exam at this time are not consistent with PMR.   Today she states that her pain is slightly improved, however still significant. On exam she had trap spasm and point tenderness to anterior shoulder. POCUS revealed hypoechoic fluid within biceps tendon sheath without visualization of tendon tear which is consistent with tendinitis. Patient was offered corticosteroid injection and after review of risks and benefits she agreed to proceed.   Plan: --s/p left subachromial corticosteroid injection --advised that her CBGs might be elevated for a few days following injection and adjustment were made to her insulin dosing --advised to rtc if she develops erythema, swelling, fevers, worsening pain for evaluation for joint infection

## 2017-07-07 NOTE — Progress Notes (Signed)
Internal Medicine Clinic Attending  I saw and evaluated the patient.  I personally confirmed the key portions of the history and exam documented by Dr. Jari Favre and I reviewed pertinent patient test results.  The assessment, diagnosis, and plan were formulated together and I agree with the documentation in the resident's note. I was present for the entirety of the procedure.

## 2017-07-28 ENCOUNTER — Telehealth: Payer: Self-pay | Admitting: *Deleted

## 2017-07-28 NOTE — Telephone Encounter (Signed)
All to [facility] on [address] about patient's need for PA for Diclofenac Gell 1%.  Approved as Voltaren  Gel.  Sander Nephew, RN 07/28/2017 11:19 AM.

## 2017-07-30 ENCOUNTER — Ambulatory Visit: Payer: Medicaid Other

## 2017-08-06 ENCOUNTER — Other Ambulatory Visit: Payer: Self-pay

## 2017-08-06 ENCOUNTER — Ambulatory Visit (INDEPENDENT_AMBULATORY_CARE_PROVIDER_SITE_OTHER): Payer: Medicaid Other | Admitting: Internal Medicine

## 2017-08-06 ENCOUNTER — Encounter: Payer: Self-pay | Admitting: Internal Medicine

## 2017-08-06 VITALS — BP 118/80 | HR 84 | Temp 98.0°F | Wt 209.2 lb

## 2017-08-06 DIAGNOSIS — Z794 Long term (current) use of insulin: Secondary | ICD-10-CM | POA: Diagnosis not present

## 2017-08-06 DIAGNOSIS — E113219 Type 2 diabetes mellitus with mild nonproliferative diabetic retinopathy with macular edema, unspecified eye: Secondary | ICD-10-CM | POA: Diagnosis present

## 2017-08-06 MED ORDER — INSULIN ISOPHANE HUMAN 100 UNIT/ML KWIKPEN: PEN_INJECTOR | SUBCUTANEOUS | 1 refills | Status: DC

## 2017-08-06 NOTE — Patient Instructions (Signed)
It was great meeting you both today.   Today we talked about your blood sugar. It sounds like we still have some work to do to get it better controlled. It sounds like you are having low blood sugars in the early morning and afternoon. In order to reduce the chance of this, please take your insulin before your 2 largest meals. In addition, I would like you to take 40 units with your morning meal and 20 units with your evening meal.   Please return to clinic in 2 weeks with your glucometer so we can go over the readings!

## 2017-08-06 NOTE — Assessment & Plan Note (Signed)
Assessment: She was seen in clinic 1 month ago and was continued on Humalin 45 units in the morning and 25 units in the evening, but did have her regimen briefly increased to 45 units BID for several days following an intra-articular steroid injection. She's actually continued to take Humalin 50 units around 8 am and 35 units around 9pm. She has been waking up feeling shaky/sweaty with blood sugars in the 70's around 5 am and also again around 3 pm. She eats 2 meals a day, the first of which is around 10 am and evening meal around 4pm. She has also been taking her Jardiance with her evening insulin. She did not bring her glucometer today.  Plan: Patient seems to be experiencing symptomatic hypoglycemia due to inappropriate timing of her insulin. I advised she take her Humalin with her breakfast around 10 am and again with her evening meal around 4pm. Will decrease her Humalin dose to 40 units in the morning and 20 units in the evening to reduce risk of hypoglycemic events and falls, but she will likely require up-titration at subsequent visits.  -Pt to take Humalin 40 units with breakfast and 20 units with dinner -Patient counseled to bring glucometer to her follow-up in 2 weeks -Instructed to contact clinic with any lows or CBGs persistently >270

## 2017-08-06 NOTE — Progress Notes (Signed)
   CC: follow-up of type 2 diabetes  HPI:  [name] is a 47 y.o. F from [place] who speaks Arabic who presents today for follow-up of uncontrolled type 2 diabetes. She does have complaint of symptomatic hypoglycemia in the early morning and afternoons.   For details regarding today's visit and the status of their chronic medical issues, please refer to the assessment and plan.  Past Medical History:  Diagnosis Date  . Anemia, iron deficiency   . Blindness of left eye   . Decreased visual acuity    Left eye  . 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 Hair loss   . History of fracture of clavicle 05/18/2015  . Hyperlipidemia   . Pap smear abnormality of cervix with LGSIL   . Routine/ritual circumcision   . Type II diabetes mellitus ([place])   . Uveitis    Review of Systems:   General: +Fatigue, diaphoresis, shaking when hypoglycemic. Denies fevers, chills. No falls since Jan. Cardiac: Denies CP, SOB Pulmonary: Denies cough, wheezing Abd: Denies nausea, vomiting, abdominal pain, changes in bowels  Physical Exam: General: Alert, in no acute distress. Pleasant and conversant, son present.  HEENT: Normocephalic, atraumatic. No hoarseness or dysarthria  Cardiac: RRR, no MGR appreciated Pulmonary: CTA BL with normal WOB on RA. Able to speak in complete sentences Abd: Soft, non-tender. +bs Extremities: Warm, perfused. No significant pedal edema.  Psych: Normal mood and affect. Appreciative of assistance with diabetes.   Vitals:   08/06/17 1538  BP: 118/80  Pulse: 84  Temp: 98 F (36.7 C)  TempSrc: Oral  SpO2: 98%  Weight: 209 lb 3.2 oz (94.9 kg)   Body mass index is 33.77 kg/m.  Assessment & Plan:   See Encounters Tab for problem based charting.  Patient discussed with Dr. Beryle Beams

## 2017-08-07 NOTE — Progress Notes (Signed)
Medicine attending: Medical history, presenting problems, physical findings, and medications, reviewed with resident physician Dr Romelle Starcher Molt on the day of the patient visit and I concur with her evaluation and management plan. Will will adjust her insulin to avoid AM hypoglycemic episodes. Short interval follow up.

## 2017-08-20 ENCOUNTER — Ambulatory Visit (INDEPENDENT_AMBULATORY_CARE_PROVIDER_SITE_OTHER): Payer: Medicaid Other | Admitting: Internal Medicine

## 2017-08-20 VITALS — BP 125/67 | HR 79 | Temp 98.4°F | Wt 213.2 lb

## 2017-08-20 DIAGNOSIS — Z794 Long term (current) use of insulin: Secondary | ICD-10-CM | POA: Diagnosis not present

## 2017-08-20 DIAGNOSIS — E113219 Type 2 diabetes mellitus with mild nonproliferative diabetic retinopathy with macular edema, unspecified eye: Secondary | ICD-10-CM

## 2017-08-20 DIAGNOSIS — H5462 Unqualified visual loss, left eye, normal vision right eye: Secondary | ICD-10-CM

## 2017-08-20 DIAGNOSIS — M25512 Pain in left shoulder: Secondary | ICD-10-CM | POA: Diagnosis not present

## 2017-08-20 DIAGNOSIS — E1169 Type 2 diabetes mellitus with other specified complication: Secondary | ICD-10-CM | POA: Diagnosis not present

## 2017-08-20 MED ORDER — INSULIN ISOPHANE HUMAN 100 UNIT/ML KWIKPEN: PEN_INJECTOR | SUBCUTANEOUS | 1 refills | Status: DC

## 2017-08-20 NOTE — Patient Instructions (Addendum)
I am referring you to the [facility] for evaluation of your shoulder pain.  For your Diabetes I recommend we decrease the dose of night time insulin from 45 to 35 units. I recommend using an alarm or some other system to make remembering this easier. If you are noticing low blood sugars despite this change please call us back.  Please continue checking your blood sugars every day so we can see how you are doing with this change.

## 2017-08-20 NOTE — Progress Notes (Signed)
CC: Follow up for diabetes and left arm pain  HPI:  [name] is a 47 y.o. female with PMHx detailed below presenting for follow up of her diabetes but with an acute complaint of left shoulder pain. She was last seen on 4/17 with Dr. Danford Bad and appears was recommended to decrease her insulin doses due to morning hypoglycemia. She has not actually adjusted this based on conversation today states still using 50 units NPH insulin the morning and 45 units in the evening. She is taking the evening dose close to 9pm and eats her last meal/snack at 10-11pm at night. She often does not take the evening dose and states this is often due to forgetting it.  Her left shoulder remains painful and very limited in her range of motion. The pain is localized to the shoulder but sometimes extends into the upper arm. This pain has been present to some extent since she fell in January. At this time she is unable to raise it even to wash or change clothes and is requiring assistance from family members for some clothing changes.  See problem based assessment and plan below for additional details.  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 She does not appear to have reduced her dose as directed by Dr. Danford Bad. As a result she is hypoglycemic when taking both doses and uncontrolled when she skips or forgets this. She is checking glucose daily in the mornings with ranges from 60s-410s.  P: Recommended she decrease evening insulin dose from 45 to 35 units. She will try using an alarm or her son to help with remembering to take her medicines. I counseled her to move her evening dose to earlier as right now the NPH insulin is just dropping her overnight without actually covering her meals. If she keeps getting lows we can titrate down further. If she is  continuing this BID dosing she might benefit from a transition to 70/30 insulin RTC in 2 weeks to follow up glycemic control with this change  Left anterior shoulder pain She had no significant relief with steroid injection in March. She keeps having tenderness and pain with severely restricted ROM now. She is now about 3.5 months out from any traumatic insult so normal strain should be improving. She is at risk for adhesive capsulitis if not able to mobilize this arm better. I do not suspect a systemic inflammatory condition causing this as it is localized to the left shoulder. Weakness is not a prominent complaint.  P: Referral to [facility] for L shoulder evaluation questioning the need for repeat imaging versus PT   Past Medical History:  Diagnosis Date  . Anemia, iron deficiency   . Blindness of left eye   . Decreased visual acuity    Left eye  . 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 Hair loss   . History of fracture of clavicle 05/18/2015  . Hyperlipidemia   . Pap smear abnormality of cervix with LGSIL   . Routine/ritual circumcision   . Type II diabetes mellitus ([place])   . Uveitis     Review of Systems: Review of Systems  Constitutional: Negative for chills and fever.  Eyes: Positive for blurred vision.  Cardiovascular: Negative for leg swelling.  Musculoskeletal: Positive for joint pain and myalgias.  Skin: Negative for rash.  Neurological: Negative for sensory change.  Endo/Heme/Allergies: Negative for polydipsia.     Physical Exam: Vitals:   08/20/17 1532  BP: 125/67  Pulse: 79  Temp: 98.4 F (36.9 C)  TempSrc: Oral  SpO2: 99%  Weight: 213 lb 3.2 oz (96.7 kg)   GENERAL- alert, co-operative, NAD HEENT- Left eye blind, moist oral mucosa CARDIAC- RRR, no murmurs, rubs or gallops. RESP- CTAB, no wheezes or  crackles. ABDOMEN- Soft, nontender NEURO- Sensation intact globally EXTREMITIES- Left shoulder ROM severely restricted unable to passive abduct past 30 degree before guarding, no joint welling or erythema SKIN- Warm, dry, No rash or lesion. PSYCH- Normal mood and affect, appropriate thought content and speech.   Assessment & Plan:   See encounters tab for problem based medical decision making.   Patient discussed with Dr. Angelia Mould

## 2017-08-21 LAB — GLUCOSE, CAPILLARY: Glucose-Capillary: 246 mg/dL — ABNORMAL HIGH (ref 65–99)

## 2017-08-22 NOTE — Assessment & Plan Note (Addendum)
She had no significant relief with steroid injection in March. She keeps having tenderness and pain with severely restricted ROM now. She is now about 3.5 months out from any traumatic insult so normal strain should be improving. She is at risk for adhesive capsulitis if not able to mobilize this arm better. I do not suspect a systemic inflammatory condition causing this as it is localized to the left shoulder. Weakness is not a prominent complaint.  P: Referral to [facility] for L shoulder evaluation questioning the need for repeat imaging versus PT

## 2017-08-22 NOTE — Progress Notes (Signed)
Internal Medicine Clinic Attending  Case discussed with Dr. Rice at the time of the visit.  We reviewed the resident's history and exam and pertinent patient test results.  I agree with the assessment, diagnosis, and plan of care documented in the resident's note.  

## 2017-08-22 NOTE — Assessment & Plan Note (Signed)
She is continuing the high dose of insulin that got recommended while getting intraarticular steroid injections. She does not appear to have reduced her dose as directed by Dr. Danford Bad. As a result she is hypoglycemic when taking both doses and uncontrolled when she skips or forgets this. She is checking glucose daily in the mornings with ranges from 60s-410s.  P: Recommended she decrease evening insulin dose from 45 to 35 units. She will try using an alarm or her son to help with remembering to take her medicines. I counseled her to move her evening dose to earlier as right now the NPH insulin is just dropping her overnight without actually covering her meals. If she keeps getting lows we can titrate down further. If she is continuing this BID dosing she might benefit from a transition to 70/30 insulin RTC in 2 weeks to follow up glycemic control with this change

## 2017-08-27 ENCOUNTER — Encounter: Payer: Self-pay | Admitting: *Deleted

## 2017-09-05 ENCOUNTER — Ambulatory Visit: Payer: Medicaid Other | Admitting: Family Medicine

## 2017-09-05 ENCOUNTER — Telehealth: Payer: Self-pay | Admitting: Family Medicine

## 2017-09-05 NOTE — Telephone Encounter (Signed)
This note is to reflect that [place] did not keep her appointment at [place] this morning.   Mort Sawyers, MD Primary Care Sports Medicine Fellow [facility]

## 2017-09-12 ENCOUNTER — Encounter: Payer: Self-pay | Admitting: Family Medicine

## 2017-09-12 ENCOUNTER — Ambulatory Visit (INDEPENDENT_AMBULATORY_CARE_PROVIDER_SITE_OTHER): Payer: Medicaid Other | Admitting: Family Medicine

## 2017-09-12 DIAGNOSIS — M25512 Pain in left shoulder: Secondary | ICD-10-CM | POA: Diagnosis not present

## 2017-09-12 DIAGNOSIS — G8929 Other chronic pain: Secondary | ICD-10-CM | POA: Insufficient documentation

## 2017-09-12 MED ORDER — DULOXETINE HCL 30 MG PO CPEP: 30.0000 mg | ORAL_CAPSULE | Freq: Every day | ORAL | 3 refills | Status: DC

## 2017-09-12 NOTE — Assessment & Plan Note (Addendum)
Patient is here presenting with relatively vague generalized shoulder pain.  Full and consistent history was relatively difficult to obtain from patient.  Physical exam was extremely limited secondary to patient's tolerance to attempts at range of motion >> this brings out concerns for possible adhesive capsulitis however exam was forced to stop without any firm endpoints due to her passive ROM -- but would not explain radiating wrist pain.  Patient's vague symptoms initially concerning for cervical radiculopathy, but this is unlikely with recent C-spine x-ray showing no onset of degenerative changes.  Patient's relatively inconsistent history and symptoms brings up concerns for possible somatization.  -Initiate Cymbalta 30 mg daily >> will likely need to increase to 60 mg daily in 1 month. -Patient is to follow-up with PCP of whom can take over Cymbalta continuation/maintenance as felt necessary per her response. -Patient to follow-up with PCP in 4 to 6 weeks.  Next: At this time it is difficult to fully assess patient's diagnosis at this time.  Most likely scenario is neurologic/psychosomatic etiology.  Patient should improve to some degree with consistent Cymbalta use if this is the case.  We cannot fully rule out adhesive capsulitis at this time.  Unfortunately treatment for this would include glenohumeral/subacromial steroid injection of which patient is within the 23-month window for repeat injection, and/or formal physical therapy -- of which patient would be unlikely to tolerate due to her inability to tolerate today's gradual manipulation to assess ROM today. -If patient has no improvement with Cymbalta and reports good compliance with this medication I would encourage looking further into adhesive capsulitis. -I would think twice before considering any short-term/long-term narcotic prescriptions for this ailment.

## 2017-09-12 NOTE — Progress Notes (Signed)
HPI  CC: Left shoulder and wrist pain Patient is a very pleasant 47 year old female presenting today with chronic persistent left shoulder and left wrist pain.  Current wrist pain is relatively vague in nature. She states that the shoulder pain began back in January when she fell.  Initially she did not have too much discomfort but she noticed gradually worsening pain which became relatively significant about 3 months ago.  She is discussed this discomfort with her PCP.  She was initially treated for suspected polymyalgia rheumatica.  Unfortunately she had no improvement with this treatment.  Patient then received a subacromial steroid injection.  She had no improvement with this.  Patient endorses feelings of swelling and achiness throughout the entire arm.  Some involvement of the base of the neck as well.  She endorses some numbness/paresthesias of her left index finger.  Patient denies any other numbness, weakness, or paresthesias.  Endorses range of motion limitation secondary to pain.  Of note: Patient is an extremely poor/difficult historian which made today's HPI as well as at the physical exam difficult to execute appropriately.  Traumatic: Possible, w/ January fall but pain was gradual onset   Location: Right neck, shoulder, arm and wrist. Quality: Aching, swelling, stiff  Duration: 3 months  Improving/Worsening: Stable/worse Makes better: Rest Makes worse: Use Associated symptoms: None  Previous Interventions Tried: Oral prednisone, subacromial injection  Past Injuries: None and less otherwise stated Past Surgeries: Noncontributory Smoking: Non-smoker Family Hx: Noncontributory  ROS: Per HPI; in addition no fever, no rash, no additional weakness, no additional numbness, no additional paresthesias, and no additional falls/injury.   Objective: BP (!) 161/78   Ht 5\' 4"  (1.626 m)   Wt 209 lb (94.8 kg)   BMI 35.87 kg/m  Gen: NAD, well groomed, a/o x3, normal affect.  CV:  Well-perfused. Warm.  Resp: Non-labored.  Neuro: Sensation intact throughout. No gross coordination deficits.  Gait: Nonpathologic posture, unremarkable stride without signs of limp or balance issues. Arm, Left: TTP noted to be very generalized across the superior, lateral, anterior, and posterior aspect of the shoulder with radiation down the arm into the dorsal wrist and hand. No evidence of bony deformity, asymmetry, or muscle atrophy; No tenderness over long head of biceps (bicipital groove). No TTP at [facility] joint. Active and passive ROM of shoulder extremely limited and difficult to assess as patient was guarding throughout the exam >> of her concerns for passive ROM loss in nearly all axes. Strength difficult to assess due to patient's tolerance and effort. Scapular function unable to be assessed secondary to reduced ROM. Sensation intact with exception of entire left index finger. Peripheral pulses intact.  Special Tests:   - Crossarm test: NEG   - Empty can, Hawkins, Obrien's test: Unable to be assessed due to patient tolerance.   Assessment and Plan:  Chronic left shoulder pain Patient is here presenting with relatively vague generalized shoulder pain.  Full and consistent history was relatively difficult to obtain from patient.  Physical exam was extremely limited secondary to patient's tolerance to attempts at range of motion >> this brings out concerns for possible adhesive capsulitis however exam was forced to stop without any firm endpoints due to her passive ROM -- but would not explain radiating wrist pain.  Patient's vague symptoms initially concerning for cervical radiculopathy, but this is unlikely with recent C-spine x-ray showing no onset of degenerative changes.  Patient's relatively inconsistent history and symptoms brings up concerns for possible somatization.  -Initiate Cymbalta  30 mg daily >> will likely need to increase to 60 mg daily in 1 month. -Patient is to follow-up with  PCP of whom can take over Cymbalta continuation/maintenance as felt necessary per her response. -Patient to follow-up with PCP in 4 to 6 weeks.  Next: At this time it is difficult to fully assess patient's diagnosis at this time.  Most likely scenario is neurologic/psychosomatic etiology.  Patient should improve to some degree with consistent Cymbalta use if this is the case.  We cannot fully rule out adhesive capsulitis at this time.  Unfortunately treatment for this would include glenohumeral/subacromial steroid injection of which patient is within the 53-month window for repeat injection, and/or formal physical therapy -- of which patient would be unlikely to tolerate due to her inability to tolerate today's gradual manipulation to assess ROM today. -If patient has no improvement with Cymbalta and reports good compliance with this medication I would encourage looking further into adhesive capsulitis. -I would think twice before considering any short-term/long-term narcotic prescriptions for this ailment.   Meds ordered this encounter  Medications  . DULoxetine (CYMBALTA) 30 MG capsule    Sig: Take 1 capsule (30 mg total) by mouth daily.    Dispense:  30 capsule    Refill:  [place], MD,MS [place] [facility] Fellow 09/12/2017 12:50 PM

## 2017-09-14 NOTE — Progress Notes (Signed)
[facility]: Attending Note: I have reviewed the chart, discussed wit the Sports Medicine Fellow. I agree with assessment and treatment plan as detailed in the [facility] note. Agree with trial of cymbalta. Do not think tyis is related to MSK system, more likely neuropathic in origin. I do not think this is adhesive capsulitis, although if she continues to guard shoulder, she is certainly at risk for that.

## 2017-09-16 DIAGNOSIS — H5440 Blindness, one eye, unspecified eye: Secondary | ICD-10-CM | POA: Insufficient documentation

## 2017-09-16 DIAGNOSIS — Z961 Presence of intraocular lens: Secondary | ICD-10-CM | POA: Insufficient documentation

## 2017-09-16 DIAGNOSIS — Z79899 Other long term (current) drug therapy: Secondary | ICD-10-CM | POA: Insufficient documentation

## 2017-10-07 ENCOUNTER — Other Ambulatory Visit: Payer: Self-pay | Admitting: Dietician

## 2017-10-07 DIAGNOSIS — E113219 Type 2 diabetes mellitus with mild nonproliferative diabetic retinopathy with macular edema, unspecified eye: Secondary | ICD-10-CM

## 2017-10-07 DIAGNOSIS — Z794 Long term (current) use of insulin: Principal | ICD-10-CM

## 2017-10-07 NOTE — Telephone Encounter (Signed)
Per [place] tracks- the [employer] is considered "DME" and cannot be done over the phone. It needs to be done via the [facility] Tracks provider portal. By going to [place] tracks, log in, then choose  PA entry. I will ask a nurse to assist with this.

## 2017-10-07 NOTE — Telephone Encounter (Signed)
Nurses are assisting with PA for [employer].

## 2017-10-13 ENCOUNTER — Emergency Department (HOSPITAL_COMMUNITY)
Admission: EM | Admit: 2017-10-13 | Discharge: 2017-10-13 | Disposition: A | Discharge status: Home / Self Care | Payer: Medicaid Other | Attending: Emergency Medicine | Admitting: Emergency Medicine

## 2017-10-13 ENCOUNTER — Encounter (HOSPITAL_COMMUNITY): Payer: Self-pay | Admitting: Emergency Medicine

## 2017-10-13 ENCOUNTER — Emergency Department (HOSPITAL_COMMUNITY): Payer: Medicaid Other

## 2017-10-13 DIAGNOSIS — Z79899 Other long term (current) drug therapy: Secondary | ICD-10-CM | POA: Diagnosis not present

## 2017-10-13 DIAGNOSIS — R112 Nausea with vomiting, unspecified: Secondary | ICD-10-CM | POA: Diagnosis not present

## 2017-10-13 DIAGNOSIS — I10 Essential (primary) hypertension: Secondary | ICD-10-CM | POA: Insufficient documentation

## 2017-10-13 DIAGNOSIS — Z794 Long term (current) use of insulin: Secondary | ICD-10-CM | POA: Diagnosis not present

## 2017-10-13 DIAGNOSIS — E119 Type 2 diabetes mellitus without complications: Secondary | ICD-10-CM | POA: Insufficient documentation

## 2017-10-13 DIAGNOSIS — R51 Headache: Secondary | ICD-10-CM | POA: Diagnosis not present

## 2017-10-13 DIAGNOSIS — E860 Dehydration: Secondary | ICD-10-CM | POA: Diagnosis not present

## 2017-10-13 DIAGNOSIS — R519 Headache, unspecified: Secondary | ICD-10-CM

## 2017-10-13 LAB — CBC
HCT: 44.7 % (ref 36.0–46.0)
Hemoglobin: 13.5 g/dL (ref 12.0–15.0)
MCH: 26.9 pg (ref 26.0–34.0)
MCHC: 30.2 g/dL (ref 30.0–36.0)
MCV: 89 fL (ref 78.0–100.0)
PLATELETS: UNDETERMINED 10*3/uL (ref 150–400)
RBC: 5.02 MIL/uL (ref 3.87–5.11)
RDW: 12 % (ref 11.5–15.5)
WBC: 5.9 10*3/uL (ref 4.0–10.5)

## 2017-10-13 LAB — COMPREHENSIVE METABOLIC PANEL
ALBUMIN: 3.5 g/dL (ref 3.5–5.0)
ALT: 16 U/L (ref 14–54)
AST: 22 U/L (ref 15–41)
Alkaline Phosphatase: 114 U/L (ref 38–126)
Anion gap: 15 (ref 5–15)
BUN: 14 mg/dL (ref 6–20)
CHLORIDE: 99 mmol/L — AB (ref 101–111)
CO2: 22 mmol/L (ref 22–32)
CREATININE: 0.56 mg/dL (ref 0.44–1.00)
Calcium: 9.1 mg/dL (ref 8.9–10.3)
GFR calc Af Amer: >60 mL/min (ref >60)
GFR calc non Af Amer: >60 mL/min (ref >60)
Glucose, Bld: 213 mg/dL — ABNORMAL HIGH (ref 65–99)
Potassium: 3.8 mmol/L (ref 3.5–5.1)
SODIUM: 136 mmol/L (ref 135–145)
Total Bilirubin: 0.5 mg/dL (ref 0.3–1.2)
Total Protein: 7.4 g/dL (ref 6.5–8.1)

## 2017-10-13 LAB — DIFFERENTIAL
BASOS ABS: 0.1 10*3/uL (ref 0.0–0.1)
Basophils Relative: 1 %
EOS ABS: 0.1 10*3/uL (ref 0.0–0.7)
Eosinophils Relative: 1 %
LYMPHS PCT: 23 %
Lymphs Abs: 1.4 10*3/uL (ref 0.7–4.0)
Monocytes Absolute: 0.2 10*3/uL (ref 0.1–1.0)
Monocytes Relative: 3 %
NEUTROS PCT: 72 %
Neutro Abs: 4.1 10*3/uL (ref 1.7–7.7)

## 2017-10-13 LAB — I-STAT TROPONIN, ED: Troponin i, poc: 0 ng/mL (ref 0.00–0.08)

## 2017-10-13 LAB — PROTIME-INR
INR: 0.89
PROTHROMBIN TIME: 12 s (ref 11.4–15.2)

## 2017-10-13 LAB — I-STAT BETA HCG BLOOD, ED (MC, WL, AP ONLY): I-stat hCG, quantitative: <5 m[IU]/mL (ref <5)

## 2017-10-13 LAB — APTT: aPTT: 20 seconds — ABNORMAL LOW (ref 24–36)

## 2017-10-13 MED ORDER — ONDANSETRON HCL 4 MG/2ML IJ SOLN
4.0000 mg | Freq: Once | INTRAMUSCULAR | Status: AC
  Administered 2017-10-13: 4 mg via INTRAVENOUS
  Filled 2017-10-13: qty 2

## 2017-10-13 MED ORDER — SODIUM CHLORIDE 0.9 % IV BOLUS
1000.0000 mL | Freq: Once | INTRAVENOUS | Status: AC
  Administered 2017-10-13: 1000 mL via INTRAVENOUS

## 2017-10-13 MED ORDER — SODIUM CHLORIDE 0.9 % IV SOLN
INTRAVENOUS | Status: DC
  Administered 2017-10-13: 17:00:00 via INTRAVENOUS

## 2017-10-13 MED ORDER — MORPHINE SULFATE (PF) 4 MG/ML IV SOLN
4.0000 mg | Freq: Once | INTRAVENOUS | Status: AC
  Administered 2017-10-13: 4 mg via INTRAVENOUS
  Filled 2017-10-13: qty 1

## 2017-10-13 MED ORDER — HYDRALAZINE HCL 20 MG/ML IJ SOLN
10.0000 mg | Freq: Once | INTRAMUSCULAR | Status: AC
  Administered 2017-10-13: 10 mg via INTRAVENOUS
  Filled 2017-10-13: qty 1

## 2017-10-13 MED ORDER — ONDANSETRON 4 MG PO TBDP: 4.0000 mg | ORAL_TABLET | Freq: Three times a day (TID) | ORAL | 0 refills | Status: DC | PRN

## 2017-10-13 NOTE — ED Triage Notes (Addendum)
Per EMS- Pt speaks Arabic. pt started vomiting at 10am. Feels dizzy and weak. Pt cool clammy and diaphoretic. CBG 175

## 2017-10-13 NOTE — ED Notes (Signed)
Pt speaks [place] Arabic

## 2017-10-13 NOTE — ED Notes (Signed)
Pt reports a decrease in headache at this time

## 2017-10-13 NOTE — Telephone Encounter (Signed)
Thank you!  Jennifer [name] 

## 2017-10-13 NOTE — ED Notes (Signed)
CBG at triage 193

## 2017-10-13 NOTE — ED Provider Notes (Signed)
[facility] EMERGENCY DEPARTMENT Provider Note   CSN: [id number] Arrival date & time: 10/13/17  1606     History   Chief Complaint Chief Complaint  Patient presents with  . Dizziness  . Weakness  . Nausea  . Emesis    HPI Jennifer [name] is a 47 y.o. female.  Pt presents to the ED today with headache and n/v.  Sx started suddenly around 1000.  The pt denies any abdominal pain.  No f/c.  The pt speaks Arabic, but her son is translating.  She declines video interpreter.     Past Medical History:  Diagnosis Date  . Anemia, iron deficiency   . Blindness of left eye   . Decreased visual acuity    Left eye  . 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 Hair loss   . History of fracture of clavicle 05/18/2015  . Hyperlipidemia   . Pap smear abnormality of cervix with LGSIL   . Routine/ritual circumcision   . Type II diabetes mellitus ([place])   . Uveitis     Patient Active Problem List   Diagnosis Date Noted  . Chronic left shoulder pain 09/12/2017  . 'light-for-dates' infant with signs of fetal malnutrition 08/20/2017  . Left anterior shoulder pain 07/06/2017  . Bilateral shoulder pain 04/29/2017  . Vaginal discharge 04/29/2017  . Tinea 01/20/2017  . Elevated blood pressure reading 07/23/2016  . 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 Fatigue 04/02/2016  . Bilateral lower extremity edema 02/08/2016  . 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 Vitamin D deficiency 10/30/2014  . Iron deficiency anemia 05/13/2013  . Hair loss 03/24/2012  . Healthcare maintenance 02/26/2011  . Hyperlipidemia 05/05/2006  . 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 CESAREAN SECTION       OB History      Gravida  7   Para  4   Term  4   Preterm      AB  3   Living  4     SAB  3   TAB      Ectopic      Multiple      Live Births               Home Medications    Prior to Admission medications   Medication Sig Start Date End Date Taking? Authorizing Provider  atorvastatin (LIPITOR) 40 MG tablet Take 1 tablet (40 mg total) by mouth daily. 11/13/16   Alphonzo Grieve, MD  atropine 1 % ophthalmic solution Place 1 drop into both eyes daily.     [provider]  Blood Glucose Monitoring Suppl (ACCU-CHEK NANO SMARTVIEW) w/Device KIT 1 kit 01/20/17   Burgess Estelle, MD  cyclobenzaprine (FLEXERIL) 5 MG tablet Take 1 tablet (5 mg total) by mouth as needed for muscle spasms (for your pain at bedtime as needed to help you sleep). 06/17/17   Ledell Noss, MD  diclofenac sodium (VOLTAREN) 1 % GEL Apply 2 g topically 4 (four) times daily. 06/20/17   Ledell Noss, MD  dorzolamide-timolol (COSOPT) 22.3-6.8 MG/ML ophthalmic solution Place 1 drop into the left eye 2 (two) times daily.    [provider]  DULoxetine (CYMBALTA) 30 MG capsule Take 1  capsule (30 mg total) by mouth daily. 09/12/17   McKeag, Marylynn Pearson, MD  empagliflozin (JARDIANCE) 10 MG TABS tablet Take 10 mg by mouth daily. 07/02/17   Alphonzo Grieve, MD  ferrous sulfate 325 (65 FE) MG tablet Take 1 tablet (325 mg total) by mouth daily. 04/29/17 04/29/18  Alphonzo Grieve, MD  folic acid (FOLVITE) 1 MG tablet Take 1 tablet (1 mg total) by mouth daily. 04/29/17   Alphonzo Grieve, MD  glucose blood (ACCU-CHEK SMARTVIEW) test strip CHECK BLOOD SUGAR 4 TIMES DAILY BEFORE MEALS AND AT BEDTIME 07/02/17   Alphonzo Grieve, MD  Insulin NPH, Human,, Isophane, (HUMULIN N KWIKPEN) 100 UNIT/ML Kiwkpen INJECT 40 UNITS SUBCUTANEOUSLY EVERY MORNING AND 35 UNITS EVERY EVENING 08/20/17   Rice, Resa Miner, MD  Insulin Pen Needle 31G X 5 MM MISC 1 Dose by Does not apply route 2 (two) times daily. 11/13/16   Alphonzo Grieve, MD  ketorolac (ACULAR) 0.5  % ophthalmic solution Place 1 drop into both eyes 4 (four) times daily.    [provider]  Lancets (ACCU-CHEK SOFT TOUCH) lancets Check Blood Sugar 4 times daily before meals and at bedtime. Dx code: E11.321 03/20/17   Alphonzo Grieve, MD  metFORMIN (GLUCOPHAGE) 1000 MG tablet Take 1 tablet (1,000 mg total) by mouth 2 (two) times daily with a meal. 02/21/17   Maryellen Pile, MD  methotrexate (RHEUMATREX) 2.5 MG tablet Take 15 mg by mouth once a week. 04/23/16   [provider]  ondansetron (ZOFRAN ODT) 4 MG disintegrating tablet Take 1 tablet (4 mg total) by mouth every 8 (eight) hours as needed. 10/13/17   Isla Pence, MD  prednisoLONE acetate (PRED FORTE) 1 % ophthalmic suspension Place 1 drop into the right eye 2 (two) times daily.     [provider]  vitamin C (ASCORBIC ACID) 500 MG tablet Take 1 tablet (500 mg total) by mouth daily. 04/29/17   Alphonzo Grieve, MD  Vitamin D, Cholecalciferol, 1000 units CAPS Take 1,000 mg by mouth daily with breakfast. 04/29/17   Alphonzo Grieve, MD    Family History No family history on file.  Social History Social History   Tobacco Use  . Smoking status: Never Smoker  . Smokeless tobacco: Never Used  Substance Use Topics  . Alcohol use: No    Alcohol/week: 0.0 oz  . Drug use: No     Allergies   Patient has no known allergies.   Review of Systems Review of Systems  Gastrointestinal: Positive for nausea and vomiting.  Neurological: Positive for headaches.  All other systems reviewed and are negative.    Physical Exam Updated Vital Signs BP 117/67   Pulse 80   Temp (!) 97.5 F (36.4 C)   Resp 16   SpO2 97%   Physical Exam  Constitutional: She is oriented to person, place, and time. She appears well-developed and well-nourished. She appears distressed.  HENT:  Head: Normocephalic and atraumatic.  Right Ear: External ear normal.  Left Ear: External ear normal.  Nose: Nose normal.  Mouth/Throat:  Oropharynx is clear and moist.  Eyes:  Left eye clouded over and blind secondary to glaucoma  Neck: Normal range of motion. Neck supple.  Cardiovascular: Normal rate, regular rhythm, normal heart sounds and intact distal pulses.  Pulmonary/Chest: Effort normal and breath sounds normal.  Abdominal: Soft. Bowel sounds are normal.  Musculoskeletal: Normal range of motion.  Neurological: She is alert and oriented to person, place, and time.  Skin: Skin is warm. Capillary refill  takes less than 2 seconds.  Psychiatric: She has a normal mood and affect. Her behavior is normal. Judgment and thought content normal.  Nursing note and vitals reviewed.    ED Treatments / Results  Labs (all labs ordered are listed, but only abnormal results are displayed) Labs Reviewed  APTT - Abnormal; Notable for the following components:      Result Value   aPTT 20 (*)    All other components within normal limits  COMPREHENSIVE METABOLIC PANEL - Abnormal; Notable for the following components:   Chloride 99 (*)    Glucose, Bld 213 (*)    All other components within normal limits  PROTIME-INR  CBC  DIFFERENTIAL  I-STAT TROPONIN, ED  I-STAT BETA HCG BLOOD, ED ([facility], [facility], [facility] ONLY)    EKG EKG Interpretation  Date/Time:  Monday October 13 2017 16:19:10 EDT Ventricular Rate:  80 PR Interval:  162 QRS Duration: 96 QT Interval:  416 QTC Calculation: 479 R Axis:   11 Text Interpretation:  Normal sinus rhythm Normal ECG Confirmed by Isla Pence [phone]) on 10/13/2017 4:44:51 PM   Radiology Ct Head Wo Contrast  Result Date: 10/13/2017 CLINICAL DATA:  Dizziness and generalized weakness with vomiting. History of type 2 diabetes. EXAM: CT HEAD WITHOUT CONTRAST TECHNIQUE: Contiguous axial images were obtained from the base of the skull through the vertex without intravenous contrast. COMPARISON:  CT head 09/12/2011. FINDINGS: Brain: No evidence of acute infarction, hemorrhage, hydrocephalus, extra-axial  collection or mass lesion/mass effect. Slight prominence of the ventricles, cisterns, and sulci suggests early atrophy. Hypoattenuation of white matter, possible early chronic microvascular ischemic change Vascular: No hyperdense vessel or unexpected calcification. Skull: Calvarium intact. Sinuses/Orbits: No acute finding. Other: None. IMPRESSION: Mild atrophy. Suspected early white matter disease. No acute intracranial findings. Electronically Signed   By: Staci Righter M.D.   On: 10/13/2017 18:09    Procedures Procedures (including critical care time)  Medications Ordered in ED Medications  sodium chloride 0.9 % bolus 1,000 mL (0 mLs Intravenous Stopped 10/13/17 1802)    And  0.9 %  sodium chloride infusion ( Intravenous New Bag/Given 10/13/17 1654)  morphine 4 MG/ML injection 4 mg (4 mg Intravenous Given 10/13/17 1658)  ondansetron (ZOFRAN) injection 4 mg (4 mg Intravenous Given 10/13/17 1658)  hydrALAZINE (APRESOLINE) injection 10 mg (10 mg Intravenous Given 10/13/17 1839)  morphine 4 MG/ML injection 4 mg (4 mg Intravenous Given 10/13/17 1839)     Initial Impression / Assessment and Plan / ED Course  I have reviewed the triage vital signs and the nursing notes.  Pertinent labs & imaging results that were available during my care of the patient were reviewed by me and considered in my medical decision making (see chart for details).    Pt is feeling much better.  BP is also better.  She is instructed to f/[facility] with [facility] doctor [facility] to review her bp meds.  Return if worse.  Final Clinical Impressions(s) / ED Diagnoses   Final diagnoses:  Dehydration  Acute nonintractable headache, unspecified headache type  Essential hypertension    ED Discharge Orders        Ordered    ondansetron (ZOFRAN ODT) 4 MG disintegrating tablet  Every 8 hours PRN     10/13/17 2015       Isla Pence, MD 10/13/17 2017

## 2017-10-14 LAB — CBG MONITORING, ED: Glucose-Capillary: 193 mg/dL — ABNORMAL HIGH (ref 70–99)

## 2017-10-31 ENCOUNTER — Encounter: Payer: Self-pay | Admitting: Dietician

## 2017-10-31 ENCOUNTER — Other Ambulatory Visit: Payer: Self-pay | Admitting: Internal Medicine

## 2017-10-31 DIAGNOSIS — Z794 Long term (current) use of insulin: Principal | ICD-10-CM

## 2017-10-31 DIAGNOSIS — E113219 Type 2 diabetes mellitus with mild nonproliferative diabetic retinopathy with macular edema, unspecified eye: Secondary | ICD-10-CM

## 2017-10-31 MED ORDER — FREESTYLE LIBRE 14 DAY READER DEVI
1.0000 | Freq: Four times a day (QID) | 0 refills | Status: DC
Start: 2017-10-31 — End: 2017-10-31

## 2017-10-31 MED ORDER — FREESTYLE LIBRE 14 DAY SENSOR MISC: 1.0000 | Freq: Four times a day (QID) | 12 refills | Status: DC

## 2017-10-31 NOTE — Addendum Note (Signed)
Addended by: Resa Miner on: 10/31/2017 04:42 PM   Modules accepted: Orders

## 2017-10-31 NOTE — Telephone Encounter (Signed)
Nurses requesting order for Continuous glucose monitoring for PA.

## 2017-10-31 NOTE — Telephone Encounter (Signed)
Thank you :)

## 2017-11-03 NOTE — Telephone Encounter (Signed)
No. The nurses are still working on it and said it would help to have an order for it.

## 2017-11-03 NOTE — Telephone Encounter (Signed)
I was under the impression that this had prior approval already?  Jennifer [name]

## 2017-11-03 NOTE — Telephone Encounter (Signed)
Called [facility] about PA for the [employer] and reader. They have no record of the PA and said it needs to be started on [facility]

## 2017-11-04 NOTE — Progress Notes (Signed)
CC: diabetes  HPI:  [name] is a 47 y.o. with a PMH of HLD, uncontrolled T2DM, with retinopathy, chronic anterior uveitis and glaucoma, Fe def anemia, Vit D deficiency, presenting to clinic for follow up on her diabetes and arm pain.  T2DM: A1c 14 in Feb 2019. She had some medication changes since then, however states that she is taking metformin 1000mg  only about once a day due to loose stools (does not take with meal); Humalin 45 units qAM and thinks she is taking 30 units qPM; empagliflozin 10mg  daily. She endorses having hypoglycemic symptoms about twice a week, usually around 4AM; this improves with a snack. She has lost her glucometer so does not check her CBGs during these times. Even before losing her glucometer, she was not regularly checking when she had symptoms.   Left shoulder pain: At our last visit in Feb, she had a corticosteroid injection into her left shoulder which she states didn't help for very long. She was referred to Sports Medicine who could not fully assess shoulder due to patient's hesitancy to complying with exam; they could not fully rule out adhesive capsulitis but though functional pain was more likely due to presentation at the time. She was started on duloxetine 30mg  daily. Today she states she has had improvement in her shoulder pain since starting the duloxetine, however her pain is still significant and she is not able to fully use her arm. She has been using topical voltaren gel as well. The pain is still mainly in her left shoulder and surrounding muscles and sometimes in the middle of her arm. She denies weakness, numbness, tingling in her arm.   Please see problem based Assessment and Plan for status of patients chronic conditions.  Past Medical History:  Diagnosis Date  . Anemia, iron deficiency   . Blindness of left eye   . Decreased visual acuity    Left eye  . 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 Hair loss   . History of fracture of clavicle 05/18/2015  . Hyperlipidemia   . Pap smear abnormality of cervix with LGSIL   . Routine/ritual circumcision   . Type II diabetes mellitus ([place])   . Uveitis     Review of Systems:   ROS Per HPI, additionally, she denies chest pain, shortness of breath, LE swelling, headaches, new vision changes, melena, hematochezia, dysuria, hematuria, abnormal vaginal bleeding.  Physical Exam:  Vitals:   11/05/17 1417  BP: 117/62  Pulse: 83  Temp: 99.6 F (37.6 C)  TempSrc: Oral  SpO2: 98%  Weight: 214 lb 3.2 oz (97.2 kg)  Height: 5\' 6"  (1.676 m)   GENERAL- alert, co-operative, appears as stated age, not in any distress. HEENT- L eye opaque, EOMI, moist mucous membranes CARDIAC- RRR, no murmurs, rubs or gallops. RESP- Moving equal volumes of air, and clear to auscultation bilaterally, no wheezes or crackles. ABDOMEN- Soft, nontender, bowel sounds present. NEURO- CN 2-12 grossly intact, except blind in left eye. EXTREMITIES- pulse 2+, symmetric, no pedal edema. Strength intact in bil UEs; patient unable to not guard to perform passive ROM testing and unable to perform active ROM testing more than 45 degrees flexion at shoulder, ~10 percent extension, ~30 percent abduction at shoulder due to pain; significant muscle spasm of trap and supraspinatus with TTP; pain in shoulder worsened with resisted elbow extension and arm  abduction. SKIN- Warm, dry, no rash or lesion. PSYCH- Normal mood and affect, appropriate thought content and speech.  Assessment & Plan:   See Encounters Tab for problem based charting.  No problem-specific Assessment & Plan notes found for this encounter.    Patient discussed with Dr. Moshe Cipro, MD Internal Medicine PGY-3

## 2017-11-05 ENCOUNTER — Other Ambulatory Visit: Payer: Self-pay

## 2017-11-05 ENCOUNTER — Ambulatory Visit: Payer: Medicaid Other | Admitting: Internal Medicine

## 2017-11-05 ENCOUNTER — Encounter: Payer: Self-pay | Admitting: Internal Medicine

## 2017-11-05 VITALS — BP 117/62 | HR 83 | Temp 99.6°F | Ht 66.0 in | Wt 214.2 lb

## 2017-11-05 DIAGNOSIS — Z79899 Other long term (current) drug therapy: Secondary | ICD-10-CM | POA: Diagnosis not present

## 2017-11-05 DIAGNOSIS — E1139 Type 2 diabetes mellitus with other diabetic ophthalmic complication: Secondary | ICD-10-CM | POA: Diagnosis not present

## 2017-11-05 DIAGNOSIS — H2010 Chronic iridocyclitis, unspecified eye: Secondary | ICD-10-CM

## 2017-11-05 DIAGNOSIS — E113219 Type 2 diabetes mellitus with mild nonproliferative diabetic retinopathy with macular edema, unspecified eye: Secondary | ICD-10-CM | POA: Diagnosis not present

## 2017-11-05 DIAGNOSIS — M25512 Pain in left shoulder: Secondary | ICD-10-CM | POA: Diagnosis not present

## 2017-11-05 DIAGNOSIS — H42 Glaucoma in diseases classified elsewhere: Secondary | ICD-10-CM | POA: Diagnosis not present

## 2017-11-05 DIAGNOSIS — E1169 Type 2 diabetes mellitus with other specified complication: Secondary | ICD-10-CM | POA: Diagnosis not present

## 2017-11-05 DIAGNOSIS — Z794 Long term (current) use of insulin: Secondary | ICD-10-CM

## 2017-11-05 DIAGNOSIS — H409 Unspecified glaucoma: Secondary | ICD-10-CM | POA: Diagnosis not present

## 2017-11-05 DIAGNOSIS — H4042X3 Glaucoma secondary to eye inflammation, left eye, severe stage: Secondary | ICD-10-CM

## 2017-11-05 DIAGNOSIS — E559 Vitamin D deficiency, unspecified: Secondary | ICD-10-CM

## 2017-11-05 DIAGNOSIS — Z0000 Encounter for general adult medical examination without abnormal findings: Secondary | ICD-10-CM

## 2017-11-05 DIAGNOSIS — D509 Iron deficiency anemia, unspecified: Secondary | ICD-10-CM | POA: Diagnosis not present

## 2017-11-05 DIAGNOSIS — I10 Essential (primary) hypertension: Secondary | ICD-10-CM

## 2017-11-05 DIAGNOSIS — H5462 Unqualified visual loss, left eye, normal vision right eye: Secondary | ICD-10-CM | POA: Diagnosis not present

## 2017-11-05 DIAGNOSIS — H209 Unspecified iridocyclitis: Secondary | ICD-10-CM

## 2017-11-05 LAB — GLUCOSE, CAPILLARY: Glucose-Capillary: 258 mg/dL — ABNORMAL HIGH (ref 70–99)

## 2017-11-05 LAB — POCT GLYCOSYLATED HEMOGLOBIN (HGB A1C): HbA1c POC (<> result, manual entry): >14 % — AB (ref 4.0–5.6)

## 2017-11-05 MED ORDER — DICLOFENAC SODIUM 1 % TD GEL: 2.0000 g | Freq: Four times a day (QID) | TRANSDERMAL | 0 refills | Status: DC

## 2017-11-05 MED ORDER — INSULIN ISOPHANE HUMAN 100 UNIT/ML KWIKPEN: PEN_INJECTOR | SUBCUTANEOUS | 1 refills | Status: DC

## 2017-11-05 MED ORDER — EMPAGLIFLOZIN 25 MG PO TABS: 25.0000 mg | ORAL_TABLET | Freq: Every day | ORAL | 1 refills | Status: DC

## 2017-11-05 MED ORDER — VITAMIN C 500 MG PO TABS: 500.0000 mg | ORAL_TABLET | Freq: Every day | ORAL | 3 refills | Status: DC

## 2017-11-05 MED ORDER — VITAMIN D (CHOLECALCIFEROL) 25 MCG (1000 UT) PO CAPS: 1000.0000 mg | ORAL_CAPSULE | Freq: Every day | ORAL | 5 refills | Status: DC

## 2017-11-05 MED ORDER — ACCU-CHEK NANO SMARTVIEW W/DEVICE KIT: PACK | 0 refills | Status: DC

## 2017-11-05 MED ORDER — FOLIC ACID 1 MG PO TABS: 1.0000 mg | ORAL_TABLET | Freq: Every day | ORAL | 1 refills | Status: DC

## 2017-11-05 MED ORDER — DULOXETINE HCL 60 MG PO CPEP: 60.0000 mg | ORAL_CAPSULE | Freq: Every day | ORAL | 1 refills | Status: DC

## 2017-11-05 MED ORDER — ATORVASTATIN CALCIUM 40 MG PO TABS: 40.0000 mg | ORAL_TABLET | Freq: Every day | ORAL | 3 refills | Status: DC

## 2017-11-05 MED ORDER — METFORMIN HCL 1000 MG PO TABS: 1000.0000 mg | ORAL_TABLET | Freq: Two times a day (BID) | ORAL | 1 refills | Status: DC

## 2017-11-05 NOTE — Patient Instructions (Addendum)
For your diabetes: Take Humalin 45 units in the morning and 25 units in the evening. Take metformin 1000mg  twice a day with a meal I've increased the empagliflozin dose to 25mg  daily (still one pill)  For your shoulder pain: I've increased the Duloxetine to 60mg  daily Use heat (heating pad, hot towel, showers) regularly throughout the day to help relax the muscle in your shoulder. Do the below exercises (can start slowly) to help increase the motion that you are able to have.      Shoulder Exercises Ask your health care provider which exercises are safe for you. Do exercises exactly as told by your health care provider and adjust them as directed. It is normal to feel mild stretching, pulling, tightness, or discomfort as you do these exercises, but you should stop right away if you feel sudden pain or your pain gets worse.Do not begin these exercises until told by your health care provider. RANGE OF MOTION EXERCISES These exercises warm up your muscles and joints and improve the movement and flexibility of your shoulder. These exercises also help to relieve pain, numbness, and tingling. These exercises involve stretching your injured shoulder directly. Exercise A: Pendulum  1. Stand near a wall or a surface that you can hold onto for balance. 2. Bend at the waist and let your left / right arm hang straight down. Use your other arm to support you. Keep your back straight and do not lock your knees. 3. Relax your left / right arm and shoulder muscles, and move your hips and your trunk so your left / right arm swings freely. Your arm should swing because of the motion of your body, not because you are using your arm or shoulder muscles. 4. Keep moving your body so your arm swings in the following directions, as told by your health care provider: ? Side to side. ? Forward and backward. ? In clockwise and counterclockwise circles. 5. Continue each motion for __________ seconds, or for as long as  told by your health care provider. 6. Slowly return to the starting position. Repeat __________ times. Complete this exercise __________ times a day. Exercise B:Flexion, Standing  1. Stand and hold a broomstick, a cane, or a similar object. Place your hands a little more than shoulder-width apart on the object. Your left / right hand should be palm-up, and your other hand should be palm-down. 2. Keep your elbow straight and keep your shoulder muscles relaxed. Push the stick down with your healthy arm to raise your left / right arm in front of your body, and then over your head until you feel a stretch in your shoulder. ? Avoid shrugging your shoulder while you raise your arm. Keep your shoulder blade tucked down toward the middle of your back. 3. Hold for __________ seconds. 4. Slowly return to the starting position. Repeat __________ times. Complete this exercise __________ times a day. Exercise C: Abduction, Standing 1. Stand and hold a broomstick, a cane, or a similar object. Place your hands a little more than shoulder-width apart on the object. Your left / right hand should be palm-up, and your other hand should be palm-down. 2. While keeping your elbow straight and your shoulder muscles relaxed, push the stick across your body toward your left / right side. Raise your left / right arm to the side of your body and then over your head until you feel a stretch in your shoulder. ? Do not raise your arm above shoulder height, unless your  health care provider tells you to do that. ? Avoid shrugging your shoulder while you raise your arm. Keep your shoulder blade tucked down toward the middle of your back. 3. Hold for __________ seconds. 4. Slowly return to the starting position. Repeat __________ times. Complete this exercise __________ times a day. Exercise D:Internal Rotation  1. Place your left / right hand behind your back, palm-up. 2. Use your other hand to dangle an exercise band, a  towel, or a similar object over your shoulder. Grasp the band with your left / right hand so you are holding onto both ends. 3. Gently pull up on the band until you feel a stretch in the front of your left / right shoulder. ? Avoid shrugging your shoulder while you raise your arm. Keep your shoulder blade tucked down toward the middle of your back. 4. Hold for __________ seconds. 5. Release the stretch by letting go of the band and lowering your hands. Repeat __________ times. Complete this exercise __________ times a day. STRETCHING EXERCISES These exercises warm up your muscles and joints and improve the movement and flexibility of your shoulder. These exercises also help to relieve pain, numbness, and tingling. These exercises are done using your healthy shoulder to help stretch the muscles of your injured shoulder. Exercise E: Warehouse manager (External Rotation and Abduction)  1. Stand in a doorway with one of your feet slightly in front of the other. This is called a staggered stance. If you cannot reach your forearms to the door frame, stand facing a corner of a room. 2. Choose one of the following positions as told by your health care provider: ? Place your hands and forearms on the door frame above your head. ? Place your hands and forearms on the door frame at the height of your head. ? Place your hands on the door frame at the height of your elbows. 3. Slowly move your weight onto your front foot until you feel a stretch across your chest and in the front of your shoulders. Keep your head and chest upright and keep your abdominal muscles tight. 4. Hold for __________ seconds. 5. To release the stretch, shift your weight to your back foot. Repeat __________ times. Complete this stretch __________ times a day. Exercise F:Extension, Standing 1. Stand and hold a broomstick, a cane, or a similar object behind your back. ? Your hands should be a little wider than shoulder-width  apart. ? Your palms should face away from your back. 2. Keeping your elbows straight and keeping your shoulder muscles relaxed, move the stick away from your body until you feel a stretch in your shoulder. ? Avoid shrugging your shoulders while you move the stick. Keep your shoulder blade tucked down toward the middle of your back. 3. Hold for __________ seconds. 4. Slowly return to the starting position. Repeat __________ times. Complete this exercise __________ times a day. STRENGTHENING EXERCISES These exercises build strength and endurance in your shoulder. Endurance is the ability to use your muscles for a long time, even after they get tired. Exercise G:External Rotation  1. Sit in a stable chair without armrests. 2. Secure an exercise band at elbow height on your left / right side. 3. Place a soft object, such as a folded towel or a small pillow, between your left / right upper arm and your body to move your elbow a few inches away (about 10 cm) from your side. 4. Hold the end of the band so it is  tight and there is no slack. 5. Keeping your elbow pressed against the soft object, move your left / right forearm out, away from your abdomen. Keep your body steady so only your forearm moves. 6. Hold for __________ seconds. 7. Slowly return to the starting position. Repeat __________ times. Complete this exercise __________ times a day. Exercise H:Shoulder Abduction  1. Sit in a stable chair without armrests, or stand. 2. Hold a __________ weight in your left / right hand, or hold an exercise band with both hands. 3. Start with your arms straight down and your left / right palm facing in, toward your body. 4. Slowly lift your left / right hand out to your side. Do not lift your hand above shoulder height unless your health care provider tells you that this is safe. ? Keep your arms straight. ? Avoid shrugging your shoulder while you do this movement. Keep your shoulder blade tucked down  toward the middle of your back. 5. Hold for __________ seconds. 6. Slowly lower your arm, and return to the starting position. Repeat __________ times. Complete this exercise __________ times a day. Exercise I:Shoulder Extension 1. Sit in a stable chair without armrests, or stand. 2. Secure an exercise band to a stable object in front of you where it is at shoulder height. 3. Hold one end of the exercise band in each hand. Your palms should face each other. 4. Straighten your elbows and lift your hands up to shoulder height. 5. Step back, away from the secured end of the exercise band, until the band is tight and there is no slack. 6. Squeeze your shoulder blades together as you pull your hands down to the sides of your thighs. Stop when your hands are straight down by your sides. Do not let your hands go behind your body. 7. Hold for __________ seconds. 8. Slowly return to the starting position. Repeat __________ times. Complete this exercise __________ times a day. Exercise J:Standing Shoulder Row 1. Sit in a stable chair without armrests, or stand. 2. Secure an exercise band to a stable object in front of you so it is at waist height. 3. Hold one end of the exercise band in each hand. Your palms should be in a thumbs-up position. 4. Bend each of your elbows to an "L" shape (about 90 degrees) and keep your upper arms at your sides. 5. Step back until the band is tight and there is no slack. 6. Slowly pull your elbows back behind you. 7. Hold for __________ seconds. 8. Slowly return to the starting position. Repeat __________ times. Complete this exercise __________ times a day. Exercise K:Shoulder Press-Ups  1. Sit in a stable chair that has armrests. Sit upright, with your feet flat on the floor. 2. Put your hands on the armrests so your elbows are bent and your fingers are pointing forward. Your hands should be about even with the sides of your body. 3. Push down on the armrests  and use your arms to lift yourself off of the chair. Straighten your elbows and lift yourself up as much as you comfortably can. ? Move your shoulder blades down, and avoid letting your shoulders move up toward your ears. ? Keep your feet on the ground. As you get stronger, your feet should support less of your body weight as you lift yourself up. 4. Hold for __________ seconds. 5. Slowly lower yourself back into the chair. Repeat __________ times. Complete this exercise __________ times a day. Exercise L:  Wall Push-Ups  1. Stand so you are facing a stable wall. Your feet should be about one arm-length away from the wall. 2. Lean forward and place your palms on the wall at shoulder height. 3. Keep your feet flat on the floor as you bend your elbows and lean forward toward the wall. 4. Hold for __________ seconds. 5. Straighten your elbows to push yourself back to the starting position. Repeat __________ times. Complete this exercise __________ times a day. This information is not intended to replace advice given to you by your health care provider. Make sure you discuss any questions you have with your health care provider. Document Released: 02/20/2005 Document Revised: 01/01/2016 Document Reviewed: 12/18/2014 Elsevier Interactive Patient Education  2018 [employer].

## 2017-11-06 LAB — LIPID PANEL
Chol/HDL Ratio: 2.9 ratio (ref 0.0–4.4)
Cholesterol, Total: 196 mg/dL (ref 100–199)
HDL: 67 mg/dL (ref >39)
LDL Calculated: 111 mg/dL — ABNORMAL HIGH (ref 0–99)
Triglycerides: 91 mg/dL (ref 0–149)
VLDL Cholesterol Cal: 18 mg/dL (ref 5–40)

## 2017-11-08 ENCOUNTER — Encounter: Payer: Self-pay | Admitting: Internal Medicine

## 2017-11-08 NOTE — Assessment & Plan Note (Signed)
Refilled Vit D

## 2017-11-08 NOTE — Assessment & Plan Note (Signed)
At our last visit in Feb, she had a corticosteroid injection into her left shoulder which she states didn't help for very long. She was referred to Sports Medicine who could not fully assess shoulder due to patient's hesitancy to complying with exam; they could not fully rule out adhesive capsulitis but though functional pain was more likely due to presentation at the time. She was started on duloxetine 30mg  daily. Today she states she has had improvement in her shoulder pain since starting the duloxetine, however her pain is still significant and she is not able to fully use her arm. She has been using topical voltaren gel as well. The pain is still mainly in her left shoulder and surrounding muscles and sometimes in the middle of her arm. She denies weakness, numbness, tingling in her arm.   On exam she has significant left trap and supraspinatus muscle spasm; ROM is difficult to assess due to patient guarding. Strength and sensation is intact.  Plan: --increase duloxetine to 60mg  daily --refilled voltaren gel --advised use of heat to help relax muscles --given ROM exercises to help improve mobility of shoulder joint

## 2017-11-08 NOTE — Assessment & Plan Note (Addendum)
A1c >14 today, uncontrolled. She is still having significant hypoglycemia in setting of inconsistent eating. In the past she has had problems with hypoglycemia on every regimen she was on including Lantus+mealtime, V go, 70/30 mix, and Lantus+byetta. She states that out of all of these regimens she has felt the best on the current NPH strategy.  She is tolerating SGLT-2I well w/o side effects. No h/o DKA in the past.   Plan: --Continue NPH 45 units qAM, decrease evening dose to 25 units qhs --discussed appropriate dosing of metformin with meals to decrease GI side effects; current SE are not significant per patient - metformin 1000mg  BID with meals --increase empagliflozin to 25mg  daily --in future, consider switching to Xultophy --urine sample attempted to be obtained today for urine microalb/cr ratio, however patient unable to void. --RNs working on prior authorization for personal CGM --prescribed regular glucometer as well; alerted patient that insurance might not cover as she received one w/in a year --foot exam performed today --f/u in 3 months; if we are able to get CGM, will get her in sooner - previously was never able to provide consistent CBG readings

## 2017-11-08 NOTE — Assessment & Plan Note (Signed)
Patient declined pap smear today. Will offer at next visit.

## 2017-11-10 ENCOUNTER — Encounter: Payer: Medicaid Other | Admitting: Pharmacist

## 2017-11-10 ENCOUNTER — Encounter: Payer: Self-pay | Admitting: Internal Medicine

## 2017-11-11 ENCOUNTER — Telehealth: Payer: Self-pay | Admitting: *Deleted

## 2017-11-11 NOTE — Telephone Encounter (Addendum)
Call to [employer] spoke with representative who said that PA would need to be completed on paper  Then faxed and not on line.  Form was printed completed and faxed to [phone].  Will need to call [phone] to follow up.  Sander Nephew, RN 11/11/2017 2:15 PM.  Call to [facility] to check on progress of PA.  Request  Was located and is pending.  Sander Nephew, RN [phone] 4:32 PM.

## 2017-11-11 NOTE — Progress Notes (Signed)
Internal Medicine Clinic Attending  Case discussed with Dr. Svalina at the time of the visit.  We reviewed the resident's history and exam and pertinent patient test results.  I agree with the assessment, diagnosis, and plan of care documented in the resident's note.  Alexander Raines, M.D., Ph.D.  

## 2017-11-12 ENCOUNTER — Telehealth: Payer: Self-pay | Admitting: *Deleted

## 2017-11-12 DIAGNOSIS — E113219 Type 2 diabetes mellitus with mild nonproliferative diabetic retinopathy with macular edema, unspecified eye: Secondary | ICD-10-CM

## 2017-11-12 DIAGNOSIS — Z794 Long term (current) use of insulin: Principal | ICD-10-CM

## 2017-11-12 NOTE — Telephone Encounter (Signed)
Jennifer [name], pt needs a meter and supplies that is covered by medicaid, the nano was sent but her daughter called and said they could not afford it and needed medicaid, is it still aviva that is only covered? Can you put it in and send to dr Jari Favre for approval?

## 2017-11-12 NOTE — Telephone Encounter (Signed)
[facility]ed [facility] as accu chek meter are supposed to be covered. pharmacy retran it and got another rejection. They are faxing [place] rejection letter to call Medicaid.

## 2017-11-13 NOTE — Telephone Encounter (Signed)
Called [facility] and gave them free meter information BIN: [id number] PCN: [place]: [id number] Group #: [id number] Identification #: [id number] Strip rx on hold from January per pharmacy. Free Nano meter and strips will be ready for pick up tomorrow. Patient's son notified.

## 2017-11-20 NOTE — Telephone Encounter (Signed)
Thank you!  Jennifer [name] 

## 2017-12-31 ENCOUNTER — Encounter (INDEPENDENT_AMBULATORY_CARE_PROVIDER_SITE_OTHER): Payer: Self-pay

## 2017-12-31 ENCOUNTER — Ambulatory Visit (INDEPENDENT_AMBULATORY_CARE_PROVIDER_SITE_OTHER): Payer: Medicaid Other | Admitting: Internal Medicine

## 2017-12-31 ENCOUNTER — Encounter: Payer: Self-pay | Admitting: Internal Medicine

## 2017-12-31 ENCOUNTER — Other Ambulatory Visit: Payer: Self-pay

## 2017-12-31 VITALS — BP 164/66 | HR 82 | Temp 98.4°F | Ht 66.0 in | Wt 217.8 lb

## 2017-12-31 DIAGNOSIS — R531 Weakness: Secondary | ICD-10-CM

## 2017-12-31 DIAGNOSIS — R030 Elevated blood-pressure reading, without diagnosis of hypertension: Secondary | ICD-10-CM

## 2017-12-31 DIAGNOSIS — H2010 Chronic iridocyclitis, unspecified eye: Secondary | ICD-10-CM | POA: Diagnosis not present

## 2017-12-31 DIAGNOSIS — E1139 Type 2 diabetes mellitus with other diabetic ophthalmic complication: Secondary | ICD-10-CM

## 2017-12-31 DIAGNOSIS — D509 Iron deficiency anemia, unspecified: Secondary | ICD-10-CM | POA: Diagnosis not present

## 2017-12-31 DIAGNOSIS — R600 Localized edema: Secondary | ICD-10-CM

## 2017-12-31 DIAGNOSIS — E559 Vitamin D deficiency, unspecified: Secondary | ICD-10-CM

## 2017-12-31 DIAGNOSIS — E11319 Type 2 diabetes mellitus with unspecified diabetic retinopathy without macular edema: Secondary | ICD-10-CM | POA: Diagnosis not present

## 2017-12-31 DIAGNOSIS — M25512 Pain in left shoulder: Secondary | ICD-10-CM

## 2017-12-31 DIAGNOSIS — Z23 Encounter for immunization: Secondary | ICD-10-CM

## 2017-12-31 DIAGNOSIS — H409 Unspecified glaucoma: Secondary | ICD-10-CM

## 2017-12-31 DIAGNOSIS — E785 Hyperlipidemia, unspecified: Secondary | ICD-10-CM | POA: Diagnosis not present

## 2017-12-31 DIAGNOSIS — R609 Edema, unspecified: Secondary | ICD-10-CM

## 2017-12-31 MED ORDER — CHLORTHALIDONE 25 MG PO TABS: 25.0000 mg | ORAL_TABLET | Freq: Every day | ORAL | 2 refills | Status: DC

## 2017-12-31 NOTE — Progress Notes (Signed)
   CC: dependent LE edema  HPI:  [name] is a 47 y.o. with a PMH of HLD, uncontrolled T2DM, with retinopathy, chronic anterior uveitis and glaucoma, Fe def anemia, Vit D deficiency, presenting to clinic for LE swelling, and left arm weakness.  LE swelling: Patient endorses bil pedal edema for the last 4 days since sitting in the hospital for the birth of her grandchild. She denies associated pain, trauma, erythema.  Left arm weakness: Patient with several month history of left shoulder pain not amenable to subacromial corticosteroid injection; she was started on duloxetine by sports medicine and dose was increased at last visit. Today she states that the pain is largely resolved on duloxetine, however she is having trouble lifting her arm.   Please see problem based Assessment and Plan for status of patients chronic conditions.  Past Medical History:  Diagnosis Date  . Anemia, iron deficiency   . Blindness of left eye   . Decreased visual acuity    Left eye  . 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 Hair loss   . History of fracture of clavicle 05/18/2015  . Hyperlipidemia   . Pap smear abnormality of cervix with LGSIL   . Routine/ritual circumcision   . Type II diabetes mellitus ([place])   . Uveitis     Review of Systems:   Per HPI  Physical Exam:  Vitals:   12/31/17 1558  BP: (!) 164/66  Pulse: 82  Temp: 98.4 F (36.9 C)  TempSrc: Oral  SpO2: 100%  Weight: 217 lb 12.8 oz (98.8 kg)  Height: 5\' 6"  (1.676 m)   GENERAL- alert, co-operative, appears as stated age, not in any distress. CARDIAC- RRR, no murmurs, rubs or gallops. RESP- Moving equal volumes of air, and clear to auscultation bilaterally, no wheezes or crackles. EXTREMITIES- pulse 2+ PT/DP, symmetric, minimal dependent edema bilaterally. Right shoulder: some TTP of  trap/supraspinatus mm, distal arm strength intact, but proximal strength including abduction, flexion and extension limited; unable to perform drop arm test SKIN- Warm, dry, no rash or lesion. PSYCH- Normal mood and affect, appropriate thought content and speech.  Assessment & Plan:   See Encounters Tab for problem based charting.   Patient discussed with Dr. Moshe Cipro, MD Internal Medicine PGY-3

## 2017-12-31 NOTE — Patient Instructions (Addendum)
For your swelling and blood pressure, take chlorthalidone 25mg  daily.  For your shoulder weakness, do the following exercises a couple of times a day to help increase mobility and strength.    Subscapularis Tendon Injury Rehab Ask your health care provider which exercises are safe for you. Do exercises exactly as told by your health care provider and adjust them as directed. It is normal to feel mild stretching, pulling, tightness, or discomfort as you do these exercises, but you should stop right away if you feel sudden pain or your pain gets worse.Do not begin these exercises until told by your health care provider. Stretching and range of motion exercises These exercises warm up your muscles and joints and improve the movement and flexibility of your shoulder. These exercises also help to relieve pain, numbness, and tingling. Exercise A: Pendulum  1. Stand near a wall or a surface that you can hold onto for balance. 2. Bend at the waist and let your left / right arm hang straight down. Use your other arm to keep your balance. 3. Relax your arm and shoulder muscles, and move your hips and your trunk so your left / right arm swings freely. Your arm should swing because of the motion of your body, not because you are using your arm or shoulder muscles. 4. Keep moving so your arm swings in the following directions, as told by your health care provider: ? Side to side. ? Forward and backward. ? In clockwise and counterclockwise circles. Repeat __________ times, or for __________ seconds per direction. Complete this exercise __________ times a day. Exercise B: Flexion, seated  1. Sit in a stable chair so your left / right forearm can rest on a flat surface. Your elbow should rest at a height that keeps your upper arm next to your body. 2. Keeping your left / right shoulder relaxed, lean forward at the waist and slide your hand forward until you feel a stretch in your shoulder. You can move your  chair farther back to increase the stretch, if needed. 3. Hold for __________ seconds. 4. Slowly return to the starting position. Repeat __________ times. Complete this exercise __________ times a day. Strengthening exercises These exercises build strength and endurance in your shoulder. Endurance is the ability to use your muscles for a long time, even after they get tired. Exercise C: Shoulder extension, prone  1. Lie on your abdomen on a firm surface so your left / right arm hangs over the edge. 2. Hold a __________ weight in your left / right hand so your palm faces in toward your body. Your arm should be straight. 3. Squeeze your shoulder blade down toward the middle of your back. 4. Slowly raise your arm behind you, up to the height of the surface that you are lying on. Keep your arm straight. 5. Hold for __________ seconds. 6. Slowly return to the starting position and relax your muscles. Repeat __________ times. Complete this exercise __________ times a day. Exercise D: Internal rotation, isometric  1. Stand or sit in a doorway, facing the door frame. 2. Bend your left / right elbow and place the inside of your wrist against the door frame. Only your wrist should be touching the frame. Keep your upper arm at your side. 3. Gently press your wrist against the door frame, as if you are trying to push your arm toward your abdomen. 4. Avoid shrugging your shoulder while you press your wrist into the door frame. Keep your shoulder  blade tucked down toward the middle of your back. 5. Hold for __________ seconds. 6. Slowly release the tension, and relax your muscles completely before you repeat the exercise. Repeat __________ times. Complete this exercise __________ times a day. Exercise E: External rotation 1. Lie down on your left / right side. 2. Place a small pillow or rolled-up towel under your left / right upper arm. 3. Bend your left / right elbow to an "L" shape (90 degrees) so  your hand is palm-down on your abdomen. 4. Squeeze your shoulder blade back toward your spine. 5. Keeping your upper arm against the pillow or towel: ? Move (pivot) your forearm and your hand away from your abdomen and toward the ceiling. ? Keep your elbow bent at 90 degrees. 6. Hold for __________ seconds. 7. Slowly return to the starting position. Repeat __________ times. Complete this exercise __________ times a day. Exercise F: Scapular retraction  1. Sit in a stable chair without arm rests, or stand. 2. Secure an exercise band to a stable object in front of you so the band is at shoulder height. 3. Hold one end of the exercise band in each hand. 4. Squeeze your shoulder blades together and move your elbows slightly behind you. Do not shrug your shoulders while you do this. 5. Hold for __________ seconds. 6. Slowly return to the starting position. Repeat __________ times. Complete this exercise __________ times a day. This information is not intended to replace advice given to you by your health care provider. Make sure you discuss any questions you have with your health care provider. Document Released: 04/08/2005 Document Revised: 12/14/2015 Document Reviewed: 03/12/2015 Elsevier Interactive Patient Education  [employer].

## 2018-01-01 ENCOUNTER — Encounter: Payer: Self-pay | Admitting: Internal Medicine

## 2018-01-01 DIAGNOSIS — R609 Edema, unspecified: Secondary | ICD-10-CM | POA: Insufficient documentation

## 2018-01-01 DIAGNOSIS — I5030 Unspecified diastolic (congestive) heart failure: Secondary | ICD-10-CM | POA: Insufficient documentation

## 2018-01-01 NOTE — Assessment & Plan Note (Signed)
Patient with bil LE dependent edema after spending the last few days mainly sitting. No s/x of fluid overload otherwise or DVT.   Plan: --in setting of HTN will start chlorthalidone 25mg  daily

## 2018-01-01 NOTE — Assessment & Plan Note (Signed)
Pain improved with duloxetine, however she seems to have proximal LUE weakness likely due to decreased use in setting of pain the last few months. Distal strength is intact so neuronal damage less likely.  Plan: --continue duloxetine 60mg  daily --given exercises to help with increasing mobility and strength --offered PT but unable to pay out of pocket cost

## 2018-01-01 NOTE — Assessment & Plan Note (Signed)
Patient with intermitted elevated BP readings now with SBPs 160's and with dependent LE edema. Previous ophthalmic exams note hypertensive changes; previously was hesitant to start antihypertensives due to intermittent low-normal BPs w/o intervention.  Plan: --start chlorthalidone 25mg  daily as she is finding LE swelling uncomfortable --f/u in 1 month for BP check and Bmet; if able to would benefit from ACE-I in setting of T2DM so if BP still elevated would consider adding ACE-I first before uptitrating chlorthalidone

## 2018-01-01 NOTE — Telephone Encounter (Signed)
Any updates on getting coverage for the personal freestyle libre?  Thank you!  Estill Dooms

## 2018-01-03 NOTE — Progress Notes (Signed)
Internal Medicine Clinic Attending  Case discussed with Dr. Svalina at the time of the visit.  We reviewed the resident's history and exam and pertinent patient test results.  I agree with the assessment, diagnosis, and plan of care documented in the resident's note.  Alexander Raines, M.D., Ph.D.  

## 2018-01-28 ENCOUNTER — Ambulatory Visit: Payer: Medicaid Other

## 2018-01-28 ENCOUNTER — Telehealth: Payer: Self-pay

## 2018-01-28 NOTE — Telephone Encounter (Signed)
Thank you!  Jennifer [name] 

## 2018-01-28 NOTE — Telephone Encounter (Signed)
Called and spoke to family, they state that pt's feet are swollen. DENIES short of breath, chest pain, h/a,N&V, weakness. Offered appt today refuses due to death in family. Will come 08-08-22

## 2018-01-28 NOTE — Telephone Encounter (Signed)
Needs to speak with a nurse about swollen feet. Please call back.

## 2018-01-29 ENCOUNTER — Telehealth: Payer: Self-pay | Admitting: Internal Medicine

## 2018-01-30 ENCOUNTER — Other Ambulatory Visit: Payer: Self-pay

## 2018-01-30 ENCOUNTER — Ambulatory Visit: Payer: Medicaid Other | Admitting: Internal Medicine

## 2018-01-30 VITALS — BP 144/75 | HR 75 | Temp 99.1°F | Ht 66.0 in | Wt 220.8 lb

## 2018-01-30 DIAGNOSIS — R609 Edema, unspecified: Secondary | ICD-10-CM | POA: Diagnosis not present

## 2018-01-30 DIAGNOSIS — R030 Elevated blood-pressure reading, without diagnosis of hypertension: Secondary | ICD-10-CM | POA: Diagnosis not present

## 2018-01-30 DIAGNOSIS — E113219 Type 2 diabetes mellitus with mild nonproliferative diabetic retinopathy with macular edema, unspecified eye: Secondary | ICD-10-CM

## 2018-01-30 DIAGNOSIS — Z794 Long term (current) use of insulin: Secondary | ICD-10-CM

## 2018-01-30 MED ORDER — FUROSEMIDE 20 MG PO TABS: 20.0000 mg | ORAL_TABLET | Freq: Two times a day (BID) | ORAL | 2 refills | Status: DC

## 2018-01-30 MED ORDER — LISINOPRIL 10 MG PO TABS: 10.0000 mg | ORAL_TABLET | Freq: Every day | ORAL | 1 refills | Status: DC

## 2018-01-30 NOTE — Progress Notes (Signed)
   CC: LE edema follow up  HPI:  [name] is a 47 y.o. female with past medical history outlined below here for follow up of LE edema. For the details of today's visit, please refer to the assessment and plan.  Past Medical History:  Diagnosis Date  . Anemia, iron deficiency   . Blindness of left eye   . Decreased visual acuity    Left eye  . 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 Hair loss   . History of fracture of clavicle 05/18/2015  . Hyperlipidemia   . Pap smear abnormality of cervix with LGSIL   . Routine/ritual circumcision   . Type II diabetes mellitus ([place])   . Uveitis     Review of Systems  Respiratory: Negative for shortness of breath.   Cardiovascular: Positive for leg swelling. Negative for chest pain and orthopnea.    Physical Exam:  Vitals:   01/30/18 1343  BP: (!) 144/75  Pulse: 75  Temp: 99.1 F (37.3 C)  TempSrc: Oral  SpO2: 99%  Weight: 220 lb 12.8 oz (100.2 kg)  Height: 5\' 6"  (1.676 m)    Constitutional: NAD, appears comfortable  Cardiovascular: RRR, no murmurs, rubs, or gallops.  Pulmonary/Chest: CTAB, no wheezes, rales, or rhonchi.  Extremities: Warm and well perfused.  No edema.  Neurological: A&Ox3, CN II - XII grossly intact.  Skin: No rashes or erythema  Psychiatric: Normal mood and affect  Assessment & Plan:   See Encounters Tab for problem based charting.  Patient discussed with Dr. Lynnae January

## 2018-01-30 NOTE — Assessment & Plan Note (Addendum)
Patient is here today for follow-up of elevated blood pressure readings.  She was previously normotensive, however for the past few months she has had intermittent elevated blood pressure readings.  Per last PCP note, prior ophthalmic exams had noted hypertensive changes.  She was started on chlorthalidone 25 mg daily 1 month ago for both elevated blood pressure and findings of new lower extremity swelling.  She is here today for follow-up.  She denies any improvement in her lower extremity swelling.  Blood pressure remains elevated 144/75.  On exam she has extensive 1+ pitting edema to the knees bilaterally.  She reports swelling improves with elevation.  Unclear reason for new lower extremity edema.  She denies any signs or symptoms of heart failure.  No significant weight gain, no shortness of breath, no chest pains, no orthopnea.  Last echocardiogram in 2017 was largely unremarkable.  CMP 4 months ago was was within normal limits, albumin 3.5.  Urine was negative for microalbuminuria 1 year ago.  Given that it is pitting edema, venous insufficiency is less likely.  There are no skin changes of venous stasis.  Overall, unclear reason for lower extremity edema.  We will repeat labs today, stop chlorthalidone, and start Lasix 20 twice daily and lisinopril 10 mg daily. --Stop chlorthalidone -Start Lasix 20 mg twice daily for both swelling and blood pressure -Start lisinopril 10 mg daily -Follow-up urine microalbumin, CMP -Follow-up PCP for scheduled appointment in 1 month  ADDENDUM: CMP with mild elevation in alk phos, otherwise unremarkable. Nothing to explain lower extremity edema. Albumin is normal. Urine has some mild albuminuria, we have already started an ACEi. Called patient with results. No changes to plan. Follow up PCP.  

## 2018-01-30 NOTE — Assessment & Plan Note (Addendum)
Patient has persistent/worsening lower extremity edema since last visit.  She was started on chlorthalidone with no improvement. --Stop chlorthalidone --Start Lasix 20 twice daily (see elevated blood pressure A&P) -- F/u CMP, urin microalbumin

## 2018-01-30 NOTE — Patient Instructions (Addendum)
Jennifer [name],  It was a pleasure to meet you. I am sorry to hear about your leg swelling. Please stop taking chlorthalidone. Instead, please start taking lasix 20 mg BID. For your blood pressure, I would also like you to start taking lisinopril 10 mg daily. Please keep your scheduled appointment next month with your primary care doctor to follow up your blood pressure and swelling. If you have any questions or concerns, call our clinic at [phone] or after hours call [phone] and ask for the internal medicine resident on call. Thank you!   Dr. Philipp Ovens

## 2018-01-31 LAB — CMP14 + ANION GAP
A/G RATIO: 1.1 — AB (ref 1.2–2.2)
ALBUMIN: 3.6 g/dL (ref 3.5–5.5)
ALT: 14 IU/L (ref 0–32)
AST: 20 IU/L (ref 0–40)
Alkaline Phosphatase: 122 IU/L — ABNORMAL HIGH (ref 39–117)
Anion Gap: 14 mmol/L (ref 10.0–18.0)
BUN/Creatinine Ratio: 28 — ABNORMAL HIGH (ref 9–23)
BUN: 17 mg/dL (ref 6–24)
CO2: 21 mmol/L (ref 20–29)
Calcium: 9.1 mg/dL (ref 8.7–10.2)
Chloride: 101 mmol/L (ref 96–106)
Creatinine, Ser: 0.61 mg/dL (ref 0.57–1.00)
GFR calc Af Amer: 126 mL/min/{1.73_m2} (ref >59)
GFR, EST NON AFRICAN AMERICAN: 109 mL/min/{1.73_m2} (ref >59)
Globulin, Total: 3.2 g/dL (ref 1.5–4.5)
Glucose: 147 mg/dL — ABNORMAL HIGH (ref 65–99)
Potassium: 4.5 mmol/L (ref 3.5–5.2)
SODIUM: 136 mmol/L (ref 134–144)
Total Protein: 6.8 g/dL (ref 6.0–8.5)

## 2018-01-31 LAB — MICROALBUMIN / CREATININE URINE RATIO
CREATININE, UR: 84.7 mg/dL
Microalb/Creat Ratio: 159.5 mg/g creat — ABNORMAL HIGH (ref 0.0–30.0)
Microalbumin, Urine: 135.1 ug/mL

## 2018-02-02 NOTE — Progress Notes (Signed)
Internal Medicine Clinic Attending  Case discussed with Dr. Guilloud at the time of the visit.  We reviewed the resident's history and exam and pertinent patient test results.  I agree with the assessment, diagnosis, and plan of care documented in the resident's note.  

## 2018-02-21 ENCOUNTER — Other Ambulatory Visit: Payer: Self-pay | Admitting: Internal Medicine

## 2018-02-21 DIAGNOSIS — R030 Elevated blood-pressure reading, without diagnosis of hypertension: Secondary | ICD-10-CM

## 2018-02-25 ENCOUNTER — Encounter: Payer: Medicaid Other | Admitting: Internal Medicine

## 2018-02-25 NOTE — Progress Notes (Deleted)
   CC: ***  HPI:  [name] is a 47 y.o. with a PMH of ***  Please see problem based Assessment and Plan for status of patients chronic conditions.  Past Medical History:  Diagnosis Date  . Anemia, iron deficiency   . Blindness of left eye   . Decreased visual acuity    Left eye  . 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 Hair loss   . History of fracture of clavicle 05/18/2015  . Hyperlipidemia   . Pap smear abnormality of cervix with LGSIL   . Routine/ritual circumcision   . Type II diabetes mellitus ([place])   . Uveitis     Review of Systems:   ***  Physical Exam:  There were no vitals filed for this visit. GENERAL- alert, co-operative, appears as stated age, not in any distress. HEENT- Atraumatic, normocephalic, PERRL, EOMI, oral mucosa appears moist. CARDIAC- RRR, no murmurs, rubs or gallops. RESP- Moving equal volumes of air, and clear to auscultation bilaterally, no wheezes or crackles. ABDOMEN- Soft, nontender, bowel sounds present. NEURO- CN 2-12 grossly intact. EXTREMITIES- pulse 2+, symmetric, no pedal edema. SKIN- Warm, dry, no rash or lesion. PSYCH- Normal mood and affect, appropriate thought content and speech.  Assessment & Plan:   See Encounters Tab for problem based charting.   Patient {GC/GE:3044014::"discussed with","seen with"} Dr. {NAMES:3044014::"Butcher","Granfortuna","E. Hoffman","Klima","Mullen","Narendra","Raines","Vincent"}   Alphonzo Grieve, MD Internal Medicine PGY-3

## 2018-03-16 ENCOUNTER — Encounter: Payer: Self-pay | Admitting: Internal Medicine

## 2018-03-25 ENCOUNTER — Telehealth: Payer: Self-pay

## 2018-03-25 NOTE — Telephone Encounter (Signed)
Requesting PA on Insulin NPH, Human,, Isophane, (HUMULIN N KWIKPEN) 100 UNIT/ML Kiwkpen.

## 2018-03-26 ENCOUNTER — Telehealth: Payer: Self-pay | Admitting: *Deleted

## 2018-03-26 ENCOUNTER — Telehealth: Payer: Self-pay | Admitting: Internal Medicine

## 2018-03-26 NOTE — Telephone Encounter (Signed)
Pt son is calling, pt needs Insulin NPH, Human,, Isophane, (HUMULIN N KWIKPEN) 100 UNIT/ML Kiwkpen medicine, pharmacy told son to call office to have it authorize, pt has been out of insulin for a couple of days.  Please send to [id number] [place], [place], son is also requesting a callback [phone] Dortha Kern)

## 2018-03-26 NOTE — Telephone Encounter (Signed)
[name], pt needs PA's on her insulins, [facility]ys just got the paperwork and is working on it, could you try to help pt/ son with getting some temporary?

## 2018-03-26 NOTE — Telephone Encounter (Signed)
Information was sent to [facility] for PA for Humulin KwikPen .  Approved 03/26/2018 thru 03/26/2019.  Sander Nephew, RN 03/26/2018 4:09 PM.

## 2018-03-26 NOTE — Telephone Encounter (Signed)
Wonderful. 

## 2018-03-26 NOTE — Telephone Encounter (Signed)
Called and spoke to pharm then called son and verified [facility]

## 2018-03-26 NOTE — Telephone Encounter (Signed)
Thank you :)

## 2018-03-26 NOTE — Telephone Encounter (Signed)
We do not have samples of Humulin NPH, however Regino Schultze was able to get her Humulin [place] PA approved. Daughter notified and verbalized understanding that it should be ready at the pharmacy.

## 2018-03-26 NOTE — Telephone Encounter (Signed)
Let us know if there is anything we can do to help. If her sugars are really high she should come to the [facility] to be seen

## 2018-03-30 NOTE — Telephone Encounter (Signed)
See 12/5 telephone encounter.

## 2018-04-01 ENCOUNTER — Encounter: Payer: Self-pay | Admitting: Internal Medicine

## 2018-04-01 ENCOUNTER — Encounter: Payer: Medicaid Other | Admitting: Internal Medicine

## 2018-04-01 NOTE — Progress Notes (Deleted)
   CC: ***  HPI:  [name] is a 47 y.o. with a PMH of ***  T2DM: Last A1c >14 in July 2019. On metformin 1000mg  BID, Empagliflozin 25mg  daily (increased last visit), NPH 45 units qAM, 25 units qPM. Personal CGM obtained? [employer]ultophy?  HTN: Started lisinopril 10mg  daily and lasix 20mg  BID (BP and LE swelling). ***BMP  Needs PAP  Please see problem based Assessment and Plan for status of patients chronic conditions.  Past Medical History:  Diagnosis Date  . Anemia, iron deficiency   . Blindness of left eye   . Decreased visual acuity    Left eye  . 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 Hair loss   . History of fracture of clavicle 05/18/2015  . Hyperlipidemia   . Pap smear abnormality of cervix with LGSIL   . Routine/ritual circumcision   . Type II diabetes mellitus ([place])   . Uveitis     Review of Systems:   ***  Physical Exam:  There were no vitals filed for this visit. GENERAL- alert, co-operative, appears as stated age, not in any distress. HEENT- Atraumatic, normocephalic, PERRL, EOMI, oral mucosa appears moist. CARDIAC- RRR, no murmurs, rubs or gallops. RESP- Moving equal volumes of air, and clear to auscultation bilaterally, no wheezes or crackles. ABDOMEN- Soft, nontender, bowel sounds present. NEURO- CN 2-12 grossly intact. EXTREMITIES- pulse 2+, symmetric, no pedal edema. SKIN- Warm, dry, no rash or lesion. PSYCH- Normal mood and affect, appropriate thought content and speech.  Assessment & Plan:   See Encounters Tab for problem based charting.   Patient {GC/GE:3044014::"discussed with","seen with"} Dr. {NAMES:3044014::"Butcher","Granfortuna","E. Hoffman","Klima","Mullen","Narendra","Raines","Vincent"}   Alphonzo Grieve, MD Internal Medicine PGY-3

## 2018-04-07 ENCOUNTER — Encounter: Payer: Self-pay | Admitting: Internal Medicine

## 2018-04-13 ENCOUNTER — Telehealth: Payer: Self-pay | Admitting: Dietician

## 2018-04-13 NOTE — Telephone Encounter (Signed)
This is a follow up on the following email.  "Per my conversation with [employer] rep today:  Below is what is needed for a CGM to be approved by [facility] Medicaid.  1- Letter of medical necessity 2- CMN-  3- Chart notes- 4- Face sheet-   When I get all this. Will fax to [facility] rep number below"    All information requested was faxed to [employer] rep on:04/08/2018

## 2018-04-20 NOTE — Telephone Encounter (Signed)
Spoke with [employer] rep who assured me he received all paperwork needed. He forwarded the papers to [employer] per [place] Medicaid's process. They may request additional information before submitting to [facility] Medicaid. After submitting the packet to [place] Medicaid he suggests a 1-2 week review process.

## 2018-05-04 ENCOUNTER — Encounter: Payer: Self-pay | Admitting: Internal Medicine

## 2018-05-18 ENCOUNTER — Telehealth: Payer: Self-pay | Admitting: Dietician

## 2018-05-18 NOTE — Telephone Encounter (Signed)
Spoke with [name] at [facility] to follow up on CGM order.  The CGM has been approved. They are waiting on response from message left for patient.   [employer] needs to communicate the following: confirm her shipping address, return of the signed assignment of benefits form (can send electronically with an email address) and to tell them someone needs to sign for receipt of the CGM when delivered.   Spoke with daughter who agreed to call [employer] and verbalized understanding of what needs to be done yet before [name] can receive the CGM.

## 2018-05-20 ENCOUNTER — Other Ambulatory Visit: Payer: Self-pay

## 2018-05-20 ENCOUNTER — Other Ambulatory Visit (HOSPITAL_COMMUNITY)
Admission: RE | Admit: 2018-05-20 | Discharge: 2018-05-20 | Disposition: A | Discharge status: Home / Self Care | Payer: Medicaid Other | Source: Ambulatory Visit | Attending: Internal Medicine | Admitting: Internal Medicine

## 2018-05-20 ENCOUNTER — Ambulatory Visit: Payer: Medicaid Other | Admitting: Internal Medicine

## 2018-05-20 ENCOUNTER — Encounter: Payer: Self-pay | Admitting: Internal Medicine

## 2018-05-20 VITALS — BP 153/59 | HR 79 | Temp 98.2°F | Ht 66.0 in | Wt 216.2 lb

## 2018-05-20 DIAGNOSIS — E113219 Type 2 diabetes mellitus with mild nonproliferative diabetic retinopathy with macular edema, unspecified eye: Secondary | ICD-10-CM

## 2018-05-20 DIAGNOSIS — N760 Acute vaginitis: Secondary | ICD-10-CM | POA: Diagnosis not present

## 2018-05-20 DIAGNOSIS — Z124 Encounter for screening for malignant neoplasm of cervix: Secondary | ICD-10-CM

## 2018-05-20 DIAGNOSIS — M25512 Pain in left shoulder: Secondary | ICD-10-CM | POA: Diagnosis not present

## 2018-05-20 DIAGNOSIS — E1139 Type 2 diabetes mellitus with other diabetic ophthalmic complication: Secondary | ICD-10-CM | POA: Diagnosis not present

## 2018-05-20 DIAGNOSIS — E11319 Type 2 diabetes mellitus with unspecified diabetic retinopathy without macular edema: Secondary | ICD-10-CM

## 2018-05-20 DIAGNOSIS — Z0000 Encounter for general adult medical examination without abnormal findings: Secondary | ICD-10-CM

## 2018-05-20 DIAGNOSIS — E785 Hyperlipidemia, unspecified: Secondary | ICD-10-CM | POA: Diagnosis not present

## 2018-05-20 DIAGNOSIS — R609 Edema, unspecified: Secondary | ICD-10-CM | POA: Diagnosis not present

## 2018-05-20 DIAGNOSIS — H4042X3 Glaucoma secondary to eye inflammation, left eye, severe stage: Secondary | ICD-10-CM

## 2018-05-20 DIAGNOSIS — H4089 Other specified glaucoma: Secondary | ICD-10-CM | POA: Diagnosis not present

## 2018-05-20 DIAGNOSIS — E1159 Type 2 diabetes mellitus with other circulatory complications: Secondary | ICD-10-CM

## 2018-05-20 DIAGNOSIS — Z794 Long term (current) use of insulin: Secondary | ICD-10-CM

## 2018-05-20 DIAGNOSIS — H42 Glaucoma in diseases classified elsewhere: Secondary | ICD-10-CM

## 2018-05-20 DIAGNOSIS — Z79899 Other long term (current) drug therapy: Secondary | ICD-10-CM

## 2018-05-20 DIAGNOSIS — I10 Essential (primary) hypertension: Secondary | ICD-10-CM

## 2018-05-20 DIAGNOSIS — H2010 Chronic iridocyclitis, unspecified eye: Secondary | ICD-10-CM

## 2018-05-20 DIAGNOSIS — H209 Unspecified iridocyclitis: Secondary | ICD-10-CM

## 2018-05-20 DIAGNOSIS — E113212 Type 2 diabetes mellitus with mild nonproliferative diabetic retinopathy with macular edema, left eye: Secondary | ICD-10-CM

## 2018-05-20 DIAGNOSIS — E1169 Type 2 diabetes mellitus with other specified complication: Secondary | ICD-10-CM

## 2018-05-20 LAB — POCT GLYCOSYLATED HEMOGLOBIN (HGB A1C)

## 2018-05-20 LAB — GLUCOSE, CAPILLARY: Glucose-Capillary: 458 mg/dL — ABNORMAL HIGH (ref 70–99)

## 2018-05-20 MED ORDER — INSULIN PEN NEEDLE 31G X 5 MM MISC: 1.0000 | Freq: Two times a day (BID) | 11 refills | Status: DC

## 2018-05-20 MED ORDER — METFORMIN HCL 1000 MG PO TABS: 1000.0000 mg | ORAL_TABLET | Freq: Two times a day (BID) | ORAL | 1 refills | Status: DC

## 2018-05-20 MED ORDER — LISINOPRIL 10 MG PO TABS: 10.0000 mg | ORAL_TABLET | Freq: Every day | ORAL | 3 refills | Status: DC

## 2018-05-20 MED ORDER — DULOXETINE HCL 60 MG PO CPEP: 60.0000 mg | ORAL_CAPSULE | Freq: Every day | ORAL | 1 refills | Status: DC

## 2018-05-20 MED ORDER — FLUCONAZOLE 150 MG PO TABS: 150.0000 mg | ORAL_TABLET | ORAL | 0 refills | Status: DC

## 2018-05-20 MED ORDER — INSULIN ISOPHANE HUMAN 100 UNIT/ML KWIKPEN: PEN_INJECTOR | SUBCUTANEOUS | 1 refills | Status: DC

## 2018-05-20 MED ORDER — ACCU-CHEK SOFT TOUCH LANCETS MISC: 11 refills | Status: DC

## 2018-05-20 MED ORDER — GLUCOSE BLOOD VI STRP: ORAL_STRIP | 5 refills | Status: DC

## 2018-05-20 MED ORDER — FUROSEMIDE 20 MG PO TABS: 20.0000 mg | ORAL_TABLET | Freq: Every day | ORAL | 3 refills | Status: DC | PRN

## 2018-05-20 MED ORDER — LIRAGLUTIDE 18 MG/3ML ~~LOC~~ SOPN: 0.6000 mg | PEN_INJECTOR | Freq: Every day | SUBCUTANEOUS | 2 refills | Status: DC

## 2018-05-20 NOTE — Progress Notes (Signed)
   CC: HTN  HPI:  [name] is a 48 y.o. with a PMH of HLD, uncontrolled T2DM, with retinopathy, chronic anterior uveitis and glaucoma, Vit D deficiency, presenting to clinic for T2DM, HTN.  T2DM: Patient on NPH 45 units qAM and 35 units qPM despite lowering dose due to hypoglycemia; metformin 1000mg  BID, and empagliflozin 25mg  daily. She states she has not had symptoms of hypoglycemia until this morning when her CBG was 69; she had shakiness which improved with snack. She checks her CBGs throughout the day which mainly reveal elevated readings >180. She otherwise denies polyuria, polydipsia, nausea, vomiting, abdominal pain. She was approved for personal CGM, however has not received the supplies yet. She administers the insulin herself without issues.   HTN: At last visit, patient was started on lisinopril 10mg  daily for persistent elevated BP readings; she was also started on lasix 20mg  BID for LE edema. Today she states she still has LE edema which makes her feet feel heavy. She has not picked up or taken either lisinopril or lasix. She denies CP, shortness of breath, headaches, new weakness/tingling.   Vulvovaginitis: Patient endorses about 1wk history of genital discomfort and burning; she denies vaginal discharge, hematuria, dysuria, fevers.  Please see problem based Assessment and Plan for status of patients chronic conditions.  Past Medical History:  Diagnosis Date  . Anemia, iron deficiency   . Blindness of left eye   . Decreased visual acuity    Left eye  . 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 Hair loss   . History of fracture of clavicle 05/18/2015  . Hyperlipidemia   . Pap smear abnormality of cervix with LGSIL   . Routine/ritual circumcision   . Type II diabetes mellitus ([place])   . Uveitis     Review of Systems:   Per  HPI  Physical Exam:  Vitals:   05/20/18 1604  BP: (!) 153/59  Pulse: 79  Temp: 98.2 F (36.8 C)  TempSrc: Oral  SpO2: 99%  Weight: 216 lb 3.2 oz (98.1 kg)  Height: 5\' 6"  (1.676 m)   GENERAL- alert, co-operative, appears as stated age, not in any distress. CARDIAC- RRR, no murmurs, rubs or gallops. RESP- Moving equal volumes of air, and clear to auscultation bilaterally, no wheezes or crackles. ABDOMEN- Soft, nontender, bowel sounds present. NEURO- CN 2-12 grossly intact. EXTREMITIES- pulse 2+, symmetric, trace pitting edema in bil feet/ankles. Gyn- vulvovaginitis; white vaginal discharge; no evidence of cellulitis or fluctuance of perineum; no other skin lesions appreciated.   Assessment & Plan:   See Encounters Tab for problem based charting.   Patient discussed with Dr. Andris Baumann, MD Internal Medicine PGY-3

## 2018-05-20 NOTE — Patient Instructions (Addendum)
For your blood pressure and swelling: Take lisinopril 10mg  every day Take lasix (furosemide) 20mg  daily as needed for swelling  For your yeast infection, take diflucan 150mg  tab once every 3 days for 3 doses.  For your diabetes: Continue NPH 45 units in the morning and 35 units in the evening Continue metformin 1000mg  twice daily with meals *Stop empagliflozin We'll start Victoza 0.6mg  daily, after one week if not having much stomach upset, increase to 1.2mg  daily

## 2018-05-21 ENCOUNTER — Telehealth: Payer: Self-pay | Admitting: Dietician

## 2018-05-21 ENCOUNTER — Encounter: Payer: Self-pay | Admitting: Internal Medicine

## 2018-05-21 DIAGNOSIS — N760 Acute vaginitis: Secondary | ICD-10-CM | POA: Insufficient documentation

## 2018-05-21 LAB — CERVICOVAGINAL ANCILLARY ONLY
Bacterial vaginitis: NEGATIVE
Candida vaginitis: POSITIVE — AB
Chlamydia: NEGATIVE
Neisseria Gonorrhea: NEGATIVE
Trichomonas: NEGATIVE

## 2018-05-21 NOTE — Assessment & Plan Note (Signed)
Patient would like second opinion in [facility]. It appears her current ophthalmologist ([facility] through [facility] office) recommends enucleation as she is still having significant pain. Condition is complicated by uncontrolled T2DM and HTN.  Plan: --referral for 2nd opinion placed

## 2018-05-21 NOTE — Assessment & Plan Note (Addendum)
Patient with uncontrolled T2DM. She reports 1 recent hypoglycemic episode.  She has severe (likely candidal) vulvovaginitis since starting empagliflozin w/o evidence of cellulitis or deep tissue infection.  Plan: --seems to be tolerating higher dose of NPH - continue NPH 45 units qAM and 35 units qPM --continue metformin 1000mg  BID --stop empagliflozin --start victoza 0.6mg  daily and titrate up if tolerating to 1.2mg  daily; will message DM educator to f/u with patient and ensure she is comfortable with injection --hopeful she will be able to pick up CGM supplies soon for better evaluation of her CBGs and titration of diabetes regimen

## 2018-05-21 NOTE — Telephone Encounter (Signed)
I called Jennifer [name], Jennifer [name]'s daughter. I gave her the corrected number for [employer] who is handling the Dexcom 6 CGM for Jennifer [name]. The numbers for them is in the order from [place] in the media section of Jennifer. [name]'s chart and here: [phone] extension [phone] or if problems daughter told to call Jennifer [name] their Diabetes Program Coordinator at the same number but extension [phone]. [name] was informed to call our office to schedule training on the Dexcom 6 cgm once they get the shipment.

## 2018-05-21 NOTE — Assessment & Plan Note (Signed)
Persistently elevated BP readings with ophtho exam indicating hypertensive retinopathy. Patient has not yet started lisinopril.  Plan: --start lisinopril 10mg  daily --f/u in 1 month for BP check and Bmet

## 2018-05-21 NOTE — Assessment & Plan Note (Signed)
Pap smear performed today. 

## 2018-05-21 NOTE — Assessment & Plan Note (Signed)
Patient with persistent, mild but symptomatic bil LE edema which apparently had not responded to chlorthalidone in the past. She was prescribed lasix at last visit but has not started.  Plan: --lasix 20mg  daily prn

## 2018-05-21 NOTE — Assessment & Plan Note (Signed)
Refilled cymbalta.

## 2018-05-21 NOTE — Progress Notes (Signed)
Case discussed with Dr. Svalina at the time of the visit.  We reviewed the resident's history and exam and pertinent patient test results.  I agree with the assessment, diagnosis and plan of care documented in the resident's note. 

## 2018-05-21 NOTE — Assessment & Plan Note (Signed)
Patient with complicated vulvovaginitis due to uncontrolled T2DM.  Plan: --stop empagliflozin --diflucan 150mg  q72hrs x3 doses --f/u Wet prep

## 2018-05-25 LAB — CYTOLOGY - PAP
Diagnosis: NEGATIVE
HPV: NOT DETECTED

## 2018-06-05 ENCOUNTER — Telehealth: Payer: Self-pay | Admitting: Dietician

## 2018-06-05 NOTE — Telephone Encounter (Signed)
Calling to follow up on Continuous glucose monitor order. Unable to leave a message

## 2018-06-05 NOTE — Telephone Encounter (Signed)
Jennifer [name] returned call. She was going to call [employer] and call us back, she verbalized understanding that she should call me for an appointment once they receive the Continuous glucose monitor.

## 2018-06-11 ENCOUNTER — Other Ambulatory Visit: Payer: Self-pay | Admitting: Internal Medicine

## 2018-06-11 DIAGNOSIS — E11319 Type 2 diabetes mellitus with unspecified diabetic retinopathy without macular edema: Secondary | ICD-10-CM

## 2018-06-11 DIAGNOSIS — Z794 Long term (current) use of insulin: Principal | ICD-10-CM

## 2018-06-12 ENCOUNTER — Emergency Department (HOSPITAL_COMMUNITY)
Admission: EM | Admit: 2018-06-12 | Discharge: 2018-06-12 | Disposition: A | Discharge status: Home / Self Care | Payer: Medicaid Other | Attending: Emergency Medicine | Admitting: Emergency Medicine

## 2018-06-12 ENCOUNTER — Emergency Department (HOSPITAL_COMMUNITY): Payer: Medicaid Other

## 2018-06-12 DIAGNOSIS — R51 Headache: Secondary | ICD-10-CM | POA: Insufficient documentation

## 2018-06-12 DIAGNOSIS — E119 Type 2 diabetes mellitus without complications: Secondary | ICD-10-CM | POA: Diagnosis not present

## 2018-06-12 DIAGNOSIS — Z79899 Other long term (current) drug therapy: Secondary | ICD-10-CM | POA: Insufficient documentation

## 2018-06-12 DIAGNOSIS — R519 Headache, unspecified: Secondary | ICD-10-CM

## 2018-06-12 DIAGNOSIS — I10 Essential (primary) hypertension: Secondary | ICD-10-CM | POA: Insufficient documentation

## 2018-06-12 DIAGNOSIS — Z794 Long term (current) use of insulin: Secondary | ICD-10-CM | POA: Diagnosis not present

## 2018-06-12 LAB — BASIC METABOLIC PANEL
Anion gap: 9 (ref 5–15)
BUN: 17 mg/dL (ref 6–20)
CO2: 23 mmol/L (ref 22–32)
Calcium: 8.8 mg/dL — ABNORMAL LOW (ref 8.9–10.3)
Chloride: 104 mmol/L (ref 98–111)
Creatinine, Ser: 0.57 mg/dL (ref 0.44–1.00)
GFR calc Af Amer: >60 mL/min (ref >60)
GFR calc non Af Amer: >60 mL/min (ref >60)
Glucose, Bld: 164 mg/dL — ABNORMAL HIGH (ref 70–99)
Potassium: 3.4 mmol/L — ABNORMAL LOW (ref 3.5–5.1)
Sodium: 136 mmol/L (ref 135–145)

## 2018-06-12 LAB — CBC WITH DIFFERENTIAL/PLATELET
ABS IMMATURE GRANULOCYTES: 0.02 10*3/uL (ref 0.00–0.07)
BASOS ABS: 0 10*3/uL (ref 0.0–0.1)
BASOS PCT: 1 %
Eosinophils Absolute: 0.1 10*3/uL (ref 0.0–0.5)
Eosinophils Relative: 2 %
HCT: 41.7 % (ref 36.0–46.0)
Hemoglobin: 12.4 g/dL (ref 12.0–15.0)
Immature Granulocytes: 0 %
Lymphocytes Relative: 26 %
Lymphs Abs: 1.7 10*3/uL (ref 0.7–4.0)
MCH: 25.9 pg — ABNORMAL LOW (ref 26.0–34.0)
MCHC: 29.7 g/dL — ABNORMAL LOW (ref 30.0–36.0)
MCV: 87.1 fL (ref 80.0–100.0)
Monocytes Absolute: 0.4 10*3/uL (ref 0.1–1.0)
Monocytes Relative: 6 %
NRBC: 0 % (ref 0.0–0.2)
Neutro Abs: 4.4 10*3/uL (ref 1.7–7.7)
Neutrophils Relative %: 65 %
Platelets: 143 10*3/uL — ABNORMAL LOW (ref 150–400)
RBC: 4.79 MIL/uL (ref 3.87–5.11)
RDW: 13.2 % (ref 11.5–15.5)
WBC: 6.6 10*3/uL (ref 4.0–10.5)

## 2018-06-12 MED ORDER — HYDROMORPHONE HCL 1 MG/ML IJ SOLN
1.0000 mg | Freq: Once | INTRAMUSCULAR | Status: AC
  Administered 2018-06-12: 1 mg via INTRAVENOUS
  Filled 2018-06-12: qty 1

## 2018-06-12 MED ORDER — KETOROLAC TROMETHAMINE 30 MG/ML IJ SOLN
30.0000 mg | Freq: Once | INTRAMUSCULAR | Status: AC
  Administered 2018-06-12: 30 mg via INTRAVENOUS
  Filled 2018-06-12: qty 1

## 2018-06-12 MED ORDER — METOCLOPRAMIDE HCL 5 MG/ML IJ SOLN
10.0000 mg | Freq: Once | INTRAMUSCULAR | Status: AC
  Administered 2018-06-12: 10 mg via INTRAVENOUS
  Filled 2018-06-12: qty 2

## 2018-06-12 MED ORDER — DEXAMETHASONE SODIUM PHOSPHATE 10 MG/ML IJ SOLN
10.0000 mg | Freq: Once | INTRAMUSCULAR | Status: AC
  Administered 2018-06-12: 10 mg via INTRAVENOUS
  Filled 2018-06-12: qty 1

## 2018-06-12 MED ORDER — DIPHENHYDRAMINE HCL 50 MG/ML IJ SOLN
25.0000 mg | Freq: Once | INTRAMUSCULAR | Status: AC
  Administered 2018-06-12: 25 mg via INTRAVENOUS
  Filled 2018-06-12: qty 1

## 2018-06-12 MED ORDER — SODIUM CHLORIDE 0.9 % IV BOLUS
1000.0000 mL | Freq: Once | INTRAVENOUS | Status: AC
  Administered 2018-06-12: 1000 mL via INTRAVENOUS

## 2018-06-12 MED ORDER — ONDANSETRON HCL 4 MG/2ML IJ SOLN
INTRAMUSCULAR | Status: AC
  Administered 2018-06-12: 4 mg
  Filled 2018-06-12: qty 2

## 2018-06-12 NOTE — ED Notes (Signed)
Per EDP, water given.

## 2018-06-12 NOTE — ED Notes (Signed)
Doctor at bedside.

## 2018-06-12 NOTE — ED Provider Notes (Signed)
[facility] EMERGENCY DEPARTMENT Provider Note   CSN: [id number] Arrival date & time: 06/12/18  1109    History   Chief Complaint Chief Complaint  Patient presents with  . Headache    HPI Jennifer [name] is a 48 y.o. female.     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  She presents today for evaluation of headache.  This started yesterday evening at approximately 9:00.  It has worsened throughout the night and into today.  She is now vomiting.  She denies any injury or trauma.  She denies any fevers, chills, or neck pain.  Patient's history is somewhat limited secondary to acuity of condition and limited English.  Patient speaks mainly Arabic and history was attempted with the use of the translator tablet present at bedside.  The history is provided by the patient.  Headache  Pain location:  Frontal Quality:  Stabbing Radiates to:  Does not radiate Onset quality:  Gradual Duration:  12 hours Timing:  Constant Progression:  Worsening Chronicity:  Recurrent   Past Medical History:  Diagnosis Date  . Anemia, iron deficiency   . Blindness of left eye   . Decreased visual acuity    Left eye  . 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 Hair loss   . History of fracture of clavicle 05/18/2015  . Hyperlipidemia   . Pap smear abnormality of cervix with LGSIL   . Routine/ritual circumcision   . Type II diabetes mellitus ([place])   . Uveitis     Patient Active Problem List   Diagnosis Date Noted  . Vulvovaginitis 05/21/2018  . Dependent edema 01/01/2018  . Left anterior shoulder pain 07/06/2017  . Hypertension associated with diabetes ([place]) 07/23/2016  . 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 Fatigue 04/02/2016  . Uveitic glaucoma of left eye, severe  stage 08/25/2015  . Vitamin D deficiency 10/30/2014  . Iron deficiency anemia 05/13/2013  . Hair loss 03/24/2012  . Healthcare maintenance 02/26/2011  . Hyperlipidemia 05/05/2006  . Type II diabetes mellitus ([place]) 04/22/1998    Past Surgical History:  Procedure Laterality Date  . CESAREAN SECTION       OB History    Gravida  7   Para  4   Term  4   Preterm      AB  3   Living  4     SAB  3   TAB      Ectopic      Multiple      Live Births               Home Medications    Prior to Admission medications   Medication Sig Start Date End Date Taking? Authorizing Provider  acetaZOLAMIDE (DIAMOX) 250 MG tablet Take by mouth. 04/21/18   [provider]  atorvastatin (LIPITOR) 40 MG tablet Take 1 tablet (40 mg total) by mouth daily. 11/05/17   Alphonzo Grieve, MD  atropine 1 % ophthalmic solution Place 1 drop into both eyes daily.     [provider]  azaTHIOprine (IMURAN) 50 MG tablet Take 25 mg by mouth daily.    [provider]  Blood Glucose Monitoring Suppl (ACCU-CHEK NANO SMARTVIEW) w/Device KIT 1 kit 11/05/17   Alphonzo Grieve, MD  Continuous Blood Gluc Receiver (  FREESTYLE LIBRE 14 DAY READER) DEVI Inject 1 Device into the skin See admin instructions. 11/20/17   Alphonzo Grieve, MD  Continuous Blood Gluc Sensor (FREESTYLE LIBRE 14 DAY SENSOR) MISC 1 each by Does not apply route 4 (four) times daily. 10/31/17   Alphonzo Grieve, MD  diclofenac sodium (VOLTAREN) 1 % GEL Apply 2 g topically 4 (four) times daily. 11/05/17   Alphonzo Grieve, MD  dorzolamide-timolol (COSOPT) 22.3-6.8 MG/ML ophthalmic solution Place 1 drop into the left eye 2 (two) times daily.    [provider]  DULoxetine (CYMBALTA) 60 MG capsule Take 1 capsule (60 mg total) by mouth daily. 05/20/18 05/20/19  Alphonzo Grieve, MD  ferrous sulfate 325 (65 FE) MG tablet Take 1 tablet (325 mg total) by mouth daily. 04/29/17 04/29/18  Alphonzo Grieve, MD  fluconazole (DIFLUCAN)  150 MG tablet Take 1 tablet (150 mg total) by mouth every 3 (three) days. 05/20/18   Alphonzo Grieve, MD  folic acid (FOLVITE) 1 MG tablet Take 1 tablet (1 mg total) by mouth daily. 11/05/17   Alphonzo Grieve, MD  furosemide (LASIX) 20 MG tablet Take 1 tablet (20 mg total) by mouth daily as needed. 05/20/18 05/20/19  Alphonzo Grieve, MD  glucose blood (ACCU-CHEK SMARTVIEW) test strip CHECK BLOOD SUGAR 4 TIMES DAILY BEFORE MEALS AND AT BEDTIME 05/20/18   Alphonzo Grieve, MD  Insulin NPH, Human,, Isophane, (HUMULIN N KWIKPEN) 100 UNIT/ML Kiwkpen INJECT 45 UNITS SUBCUTANEOUSLY EVERY MORNING AND 35 UNITS EVERY EVENING 05/20/18   Alphonzo Grieve, MD  Insulin Pen Needle 31G X 5 MM MISC 1 Dose by Does not apply route 2 (two) times daily. 05/20/18   Alphonzo Grieve, MD  ketorolac (ACULAR) 0.5 % ophthalmic solution Place 1 drop into both eyes 4 (four) times daily.    [provider]  Lancets (ACCU-CHEK SOFT TOUCH) lancets Check Blood Sugar 4 times daily before meals and at bedtime. Dx code: E11.321 05/20/18   Alphonzo Grieve, MD  liraglutide (VICTOZA) 18 MG/3ML SOPN Inject 0.1 mLs (0.6 mg total) into the skin daily. After 1 week increase to 0.48m (1.273mtotal) into skin daily 05/20/18   SvAlphonzo GrieveMD  lisinopril (PRINIVIL,ZESTRIL) 10 MG tablet Take 1 tablet (10 mg total) by mouth daily. 05/20/18   SvAlphonzo GrieveMD  metFORMIN (GLUCOPHAGE) 1000 MG tablet TAKE 1 TABLET (1,000 MG TOTAL) BY MOUTH 2 (TWO) TIMES DAILY WITH A MEAL. 06/11/18   SvAlphonzo GrieveMD  prednisoLONE acetate (PRED FORTE) 1 % ophthalmic suspension Place 1 drop into the right eye 2 (two) times daily.     [provider]  vitamin C (ASCORBIC ACID) 500 MG tablet Take 1 tablet (500 mg total) by mouth daily. 11/05/17   SvAlphonzo GrieveMD  Vitamin D, Cholecalciferol, 1000 units CAPS Take 1,000 mg by mouth daily with breakfast. 11/05/17   SvAlphonzo GrieveMD    Family History No family history on file.  Social History Social  History   Tobacco Use  . Smoking status: Never Smoker  . Smokeless tobacco: Never Used  Substance Use Topics  . Alcohol use: No    Alcohol/week: 0.0 standard drinks  . Drug use: No     Allergies   Patient has no known allergies.   Review of Systems Review of Systems  Neurological: Positive for headaches.  All other systems reviewed and are negative.    Physical Exam Updated Vital Signs BP (!) 189/92   Pulse 85   Temp 97.8 F (36.6 C)   Resp 18  SpO2 99%   Physical Exam Vitals signs and nursing note reviewed.  Constitutional:      Appearance: She is well-developed.     Comments: Patient is awake and alert.  She is actively vomiting, moaning, and appears very uncomfortable.  HENT:     Head: Normocephalic and atraumatic.  Eyes:     Comments: Left eye has a hazy cornea with unreactive pupil.  Neck:     Musculoskeletal: Normal range of motion and neck supple.  Cardiovascular:     Rate and Rhythm: Normal rate and regular rhythm.     Heart sounds: No murmur. No friction rub. No gallop.   Pulmonary:     Effort: Pulmonary effort is normal. No respiratory distress.     Breath sounds: Normal breath sounds. No wheezing.  Abdominal:     General: Bowel sounds are normal. There is no distension.     Palpations: Abdomen is soft.     Tenderness: There is no abdominal tenderness.  Musculoskeletal: Normal range of motion.  Skin:    General: Skin is warm and dry.  Neurological:     Mental Status: She is alert and oriented to person, place, and time.     Cranial Nerves: No cranial nerve deficit or facial asymmetry.      ED Treatments / Results  Labs (all labs ordered are listed, but only abnormal results are displayed) Labs Reviewed  BASIC METABOLIC PANEL  CBC WITH DIFFERENTIAL/PLATELET    EKG EKG Interpretation  Date/Time:  Friday June 12 2018 11:21:00 EST Ventricular Rate:  87 PR Interval:    QRS Duration: 111 QT Interval:  407 QTC Calculation: 490 R  Axis:   15 Text Interpretation:  Sinus rhythm Ventricular premature complex Borderline T abnormalities, anterior leads Borderline prolonged QT interval Baseline wander in lead(s) V2 Confirmed by Veryl Speak [phone]) on 06/12/2018 11:25:38 AM   Radiology No results found.  Procedures Procedures (including critical care time)  Medications Ordered in ED Medications  ondansetron (ZOFRAN) 4 MG/2ML injection (has no administration in time range)  sodium chloride 0.9 % bolus 1,000 mL (has no administration in time range)  dexamethasone (DECADRON) injection 10 mg (has no administration in time range)  metoCLOPramide (REGLAN) injection 10 mg (has no administration in time range)  diphenhydrAMINE (BENADRYL) injection 25 mg (has no administration in time range)     Initial Impression / Assessment and Plan / ED Course  I have reviewed the triage vital signs and the nursing notes.  Pertinent labs & imaging results that were available during my care of the patient were reviewed by me and considered in my medical decision making (see chart for details).  Patient presents here with complaints of headache, nausea, and vomiting.  This started yesterday evening and rapidly worsened this morning.  Her neurologic exam is nonfocal and head CT is negative.  She was given a migraine cocktail with some relief followed by Dilaudid.  She is now feeling much better.  At this point, I see no indication for admission and believe she is appropriate for discharge.  She did have some slight blood in her vomit which I suspect was related to a Mallory-Weiss tear she was having significant retching upon presentation.  Her abdomen is benign and hemoglobin is 12.4 and she is hemodynamically stable.  Final Clinical Impressions(s) / ED Diagnoses   Final diagnoses:  None    ED Discharge Orders    None       Veryl Speak, MD 06/12/18 1410

## 2018-06-12 NOTE — Discharge Instructions (Addendum)
Follow-up with your primary doctor this week, and return to the ER if your symptoms worsen or change.

## 2018-06-12 NOTE — ED Triage Notes (Signed)
Pt brought in by ems for severe headache with n/v that started last night ; pt received 4 mg of zofran prior to arrival ;

## 2018-07-03 ENCOUNTER — Ambulatory Visit: Payer: Medicaid Other

## 2018-07-03 ENCOUNTER — Encounter: Payer: Self-pay | Admitting: Internal Medicine

## 2018-07-06 ENCOUNTER — Other Ambulatory Visit: Payer: Self-pay | Admitting: Internal Medicine

## 2018-07-06 DIAGNOSIS — Z794 Long term (current) use of insulin: Principal | ICD-10-CM

## 2018-07-06 DIAGNOSIS — E113212 Type 2 diabetes mellitus with mild nonproliferative diabetic retinopathy with macular edema, left eye: Secondary | ICD-10-CM

## 2018-07-15 ENCOUNTER — Telehealth: Payer: Self-pay | Admitting: Internal Medicine

## 2018-07-15 NOTE — Telephone Encounter (Signed)
Attempted contacting patient via phone interpreter to discuss recent diabetes medication changes as she missed her last appointment, however she was not able to be reached.   I will ask our DM educator to reach out to her again in the next few days if possible. At last visit she was started on Victoza 0.6mg  daily with plan to titrate up weekly if tolerating to 1.2mg  daily then 1.8mg  daily; she was continued on NPH 45 units qAM and 35 units qPM.  Alphonzo Grieve

## 2018-07-16 NOTE — Telephone Encounter (Addendum)
Spoke with daughter who was at work. She was not sure if her mother started Victoza. She called her mother using a three way call- Makenzy told her that she has started it and is taking "45 in the morning and 30 at night.  Blood sugars- 200-97-224 today , checks 1-2x/day.  I explained to her daughter that this is not a correct dose for Victoza. [place] Herb, her daughter will check when she gets home. We agreed to talk tomorrow.

## 2018-07-17 NOTE — Telephone Encounter (Signed)
Tried calling this patient. Their voicemail box is not set up. I was unable to leave a message  

## 2018-07-20 NOTE — Telephone Encounter (Signed)
I spoke with Ms. Naumann's daughter, Jennifer [name],  who does not know if her mother is taking liraglutide. Her mother is napping. She agreed to talk at 1:15 Pm today and to have her mother and her mothers medication with her.

## 2018-07-20 NOTE — Telephone Encounter (Signed)
Thank you!  Jennifer [name] 

## 2018-07-20 NOTE — Telephone Encounter (Signed)
Per [facility] she filled :  Humulin on march 16th and got 45 ml Liraglutide in January and got  6 ml.

## 2018-07-21 NOTE — Telephone Encounter (Signed)
Left voicemail message for her son, Earlie Server, to call us with information about how she is taking Victoza and how it affected her blood sugars.

## 2018-07-21 NOTE — Telephone Encounter (Signed)
Tried calling this patient. The voicemail box is not set up. I was unable to leave a message

## 2018-08-13 ENCOUNTER — Telehealth: Payer: Self-pay | Admitting: Internal Medicine

## 2018-08-13 NOTE — Telephone Encounter (Signed)
Pt's son would like a call back Bosh [phone] about her nose pain. Pt feels like a knot is in her nose.  Medications not working.

## 2018-08-18 NOTE — Telephone Encounter (Signed)
Return call to pt's son - stated pt has c/o tooth pain and now right side of her face is swollen. Offered tele health visit appt which I explained ; stated she wants to come in to the office then changed his mind. [facility] tele health appt scheduled for tomorrow afternoon per front office.

## 2018-08-18 NOTE — Telephone Encounter (Signed)
Thank you. I agree. She actually may need an in person visit depending on how the tele visit goes

## 2018-08-19 ENCOUNTER — Encounter: Payer: Self-pay | Admitting: Internal Medicine

## 2018-08-19 ENCOUNTER — Other Ambulatory Visit: Payer: Self-pay

## 2018-08-19 ENCOUNTER — Ambulatory Visit (INDEPENDENT_AMBULATORY_CARE_PROVIDER_SITE_OTHER): Payer: Medicaid Other | Admitting: Internal Medicine

## 2018-08-19 DIAGNOSIS — K0889 Other specified disorders of teeth and supporting structures: Secondary | ICD-10-CM

## 2018-08-19 HISTORY — DX: Other specified disorders of teeth and supporting structures: K08.89

## 2018-08-19 NOTE — Progress Notes (Signed)
Internal Medicine Clinic Attending  Case discussed with Dr. Winfrey  at the time of the visit.  We reviewed the resident's history and exam and pertinent patient test results.  I agree with the assessment, diagnosis, and plan of care documented in the resident's note.  

## 2018-08-19 NOTE — Progress Notes (Signed)
   [facility] Telephone Encounter  Reason for call:   This telephone encounter was created for [name] on 08/19/2018 for the following purpose/cc Tooth pain, right side of face swelling.   Pertinent Data:   Hx of T2DM poorly controlled ROS: Pulmonary: pt denies increased work of breathing, shortness of breath,  Cardiac: pt denies palpitations, chest pain,   Abdominal: pt denies abdominal pain, nausea, vomiting, or diarrhea   Assessment / Plan / Recommendations:   Tooth pain/facial swelling: has poor dentition at baseline, history of dental abscess.  She has not seen a dentist in about one year.  Dentist is on [address].  No fevers chills or systemic symptoms.  Diabetes has been poorly controlled for some time.  She said it has improved recently averaging around 140.  Called the dentist said they would not see patients until May 14th but the urgency of her situation was not conveyed.  Pain has improved slightly over the last few days pt not in intractable pain main concern is that the tooth may be infected.    P: pt needs dental follow up, if she cannot get any access to dental services will need to be examined in person tomorrow to see if there is visible abscess requiring abx  Continue NSAID's/tylenol,  pain not the main reason she is seeking evaluation  As always, pt is advised that if symptoms worsen or new symptoms arise, they should go to an urgent care facility or to to ER for further evaluation.   Consent and Medical Decision Making:   Patient discussed with Dr. Dareen Piano  This is a telephone encounter between Jennifer [name] and Vickki Muff on 08/19/2018 for Tooth pain/Facial Swelling. The visit was conducted with the patient located at home and Vickki Muff at [facility]. The patient's identity was confirmed using their DOB and current address. The his/her legal guardian has consented to being evaluated through a telephone encounter and  understands the associated risks (an examination cannot be done and the patient may need to come in for an appointment) / benefits (allows the patient to remain at home, decreasing exposure to coronavirus). I personally spent 11 minutes on medical discussion.

## 2018-08-22 ENCOUNTER — Emergency Department (HOSPITAL_COMMUNITY)
Admission: EM | Admit: 2018-08-22 | Discharge: 2018-08-23 | Disposition: A | Discharge status: Home / Self Care | Payer: Medicaid Other | Attending: Emergency Medicine | Admitting: Emergency Medicine

## 2018-08-22 ENCOUNTER — Other Ambulatory Visit: Payer: Self-pay

## 2018-08-22 ENCOUNTER — Encounter (HOSPITAL_COMMUNITY): Payer: Self-pay | Admitting: Emergency Medicine

## 2018-08-22 ENCOUNTER — Ambulatory Visit (HOSPITAL_COMMUNITY)
Admission: EM | Admit: 2018-08-22 | Discharge: 2018-08-22 | Disposition: A | Discharge status: Home / Self Care | Payer: Medicaid Other | Attending: Emergency Medicine | Admitting: Emergency Medicine

## 2018-08-22 ENCOUNTER — Emergency Department (HOSPITAL_COMMUNITY): Payer: Medicaid Other

## 2018-08-22 DIAGNOSIS — Z79899 Other long term (current) drug therapy: Secondary | ICD-10-CM | POA: Diagnosis not present

## 2018-08-22 DIAGNOSIS — Z794 Long term (current) use of insulin: Secondary | ICD-10-CM | POA: Insufficient documentation

## 2018-08-22 DIAGNOSIS — L03211 Cellulitis of face: Secondary | ICD-10-CM | POA: Insufficient documentation

## 2018-08-22 DIAGNOSIS — K0889 Other specified disorders of teeth and supporting structures: Secondary | ICD-10-CM | POA: Diagnosis not present

## 2018-08-22 DIAGNOSIS — E119 Type 2 diabetes mellitus without complications: Secondary | ICD-10-CM | POA: Insufficient documentation

## 2018-08-22 DIAGNOSIS — L0291 Cutaneous abscess, unspecified: Secondary | ICD-10-CM

## 2018-08-22 DIAGNOSIS — I10 Essential (primary) hypertension: Secondary | ICD-10-CM | POA: Insufficient documentation

## 2018-08-22 DIAGNOSIS — K047 Periapical abscess without sinus: Secondary | ICD-10-CM | POA: Diagnosis not present

## 2018-08-22 LAB — COMPREHENSIVE METABOLIC PANEL
ALT: 14 U/L (ref 0–44)
AST: 14 U/L — ABNORMAL LOW (ref 15–41)
Albumin: 3 g/dL — ABNORMAL LOW (ref 3.5–5.0)
Alkaline Phosphatase: 117 U/L (ref 38–126)
Anion gap: 10 (ref 5–15)
BUN: 19 mg/dL (ref 6–20)
CO2: 23 mmol/L (ref 22–32)
Calcium: 8.8 mg/dL — ABNORMAL LOW (ref 8.9–10.3)
Chloride: 102 mmol/L (ref 98–111)
Creatinine, Ser: 0.76 mg/dL (ref 0.44–1.00)
GFR calc Af Amer: >60 mL/min (ref >60)
GFR calc non Af Amer: >60 mL/min (ref >60)
Glucose, Bld: 351 mg/dL — ABNORMAL HIGH (ref 70–99)
Potassium: 4.2 mmol/L (ref 3.5–5.1)
Sodium: 135 mmol/L (ref 135–145)
Total Bilirubin: 0.4 mg/dL (ref 0.3–1.2)
Total Protein: 7.1 g/dL (ref 6.5–8.1)

## 2018-08-22 LAB — CBC WITH DIFFERENTIAL/PLATELET
Abs Immature Granulocytes: 0.01 10*3/uL (ref 0.00–0.07)
Basophils Absolute: 0 10*3/uL (ref 0.0–0.1)
Basophils Relative: 1 %
Eosinophils Absolute: 0.2 10*3/uL (ref 0.0–0.5)
Eosinophils Relative: 3 %
HCT: 39.1 % (ref 36.0–46.0)
Hemoglobin: 12.4 g/dL (ref 12.0–15.0)
Immature Granulocytes: 0 %
Lymphocytes Relative: 23 %
Lymphs Abs: 1.2 10*3/uL (ref 0.7–4.0)
MCH: 26.7 pg (ref 26.0–34.0)
MCHC: 31.7 g/dL (ref 30.0–36.0)
MCV: 84.1 fL (ref 80.0–100.0)
Monocytes Absolute: 0.3 10*3/uL (ref 0.1–1.0)
Monocytes Relative: 6 %
Neutro Abs: 3.6 10*3/uL (ref 1.7–7.7)
Neutrophils Relative %: 67 %
Platelets: 177 10*3/uL (ref 150–400)
RBC: 4.65 MIL/uL (ref 3.87–5.11)
RDW: 12.3 % (ref 11.5–15.5)
WBC: 5.3 10*3/uL (ref 4.0–10.5)
nRBC: 0 % (ref 0.0–0.2)

## 2018-08-22 LAB — URINALYSIS, ROUTINE W REFLEX MICROSCOPIC
Bacteria, UA: NONE SEEN
Bilirubin Urine: NEGATIVE
Glucose, UA: >=500 mg/dL — AB
Ketones, ur: NEGATIVE mg/dL
Nitrite: NEGATIVE
Protein, ur: 30 mg/dL — AB
Specific Gravity, Urine: 1.033 — ABNORMAL HIGH (ref 1.005–1.030)
pH: 6 (ref 5.0–8.0)

## 2018-08-22 LAB — LACTIC ACID, PLASMA: Lactic Acid, Venous: 1.2 mmol/L (ref 0.5–1.9)

## 2018-08-22 MED ORDER — IOHEXOL 300 MG/ML  SOLN
100.0000 mL | Freq: Once | INTRAMUSCULAR | Status: AC | PRN
  Administered 2018-08-22: 100 mL via INTRAVENOUS

## 2018-08-22 MED ORDER — SULFAMETHOXAZOLE-TRIMETHOPRIM 800-160 MG PO TABS: 1.0000 | ORAL_TABLET | Freq: Two times a day (BID) | ORAL | 0 refills | Status: AC

## 2018-08-22 MED ORDER — AMOXICILLIN-POT CLAVULANATE 875-125 MG PO TABS: 1.0000 | ORAL_TABLET | Freq: Two times a day (BID) | ORAL | 0 refills | Status: AC

## 2018-08-22 MED ORDER — SODIUM CHLORIDE 0.9% FLUSH
3.0000 mL | Freq: Once | INTRAVENOUS | Status: AC
  Administered 2018-08-22: 3 mL via INTRAVENOUS

## 2018-08-22 NOTE — ED Triage Notes (Signed)
Brought in by son for facial pain, redness, and swelling to right side of face as well as dental pain to right upper side.  Was seen by dentist a few days ago.  Placed on antibiotics.  Reports no relief so she was seen today by UC and placed on bactrim and augmentin.  Also reports some drainage from right nare.

## 2018-08-22 NOTE — Discharge Instructions (Signed)
Warm compresses.  Ibuprofen as prescribed to help with pain.  Please stop previously prescribed antibiotics and start the two I have sent.  Please follow up with dentist for definitive treatment.  If any worsening of symptoms please go to the ER.

## 2018-08-22 NOTE — ED Provider Notes (Signed)
[facility]    CSN: [id number] Arrival date & time: 08/22/18  1544     History   Chief Complaint Chief Complaint  Patient presents with  . Dental Pain    HPI Jennifer [name] is a 48 y.o. female.   Jennifer [name] presents with daughter with complaints of pain and swelling to left face. Hx of two teeth which need to be pulled with pain which started originally approximately 1 week ago. Saw a dentist three days ago and was started on antibiotics. Pain has persisted and now with swelling into cheek and nose. No fevers. No nausea or vomiting. Denies any previous similar. Has been taking aspirin and ibuprofen for pain control which minimally helps. Pain 9/10. No drainage. No jaw pain. Has peen taking pen v since 4/30.   Daughter provides arabic interpretation per patient and daughter request.   ROS per HPI, negative if not otherwise mentioned.      Past Medical History:  Diagnosis Date  . Anemia, iron deficiency   . Blindness of left eye   . Decreased visual acuity    Left eye  . 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 Hair loss   . History of fracture of clavicle 05/18/2015  . Hyperlipidemia   . Pap smear abnormality of cervix with LGSIL   . Routine/ritual circumcision   . Type II diabetes mellitus ([place])   . Uveitis     Patient Active Problem List   Diagnosis Date Noted  . Pain, dental 08/19/2018  . Vulvovaginitis 05/21/2018  . Dependent edema 01/01/2018  . Left anterior shoulder pain 07/06/2017  . Hypertension associated with diabetes ([place]) 07/23/2016  . 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 Fatigue 04/02/2016  . 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 Vitamin D deficiency 10/30/2014  . Iron deficiency anemia 05/13/2013  . Hair loss 03/24/2012  . Healthcare maintenance 02/26/2011  .  Hyperlipidemia 05/05/2006  . Type II diabetes mellitus ([place]) 04/22/1998    Past Surgical History:  Procedure Laterality Date  . CESAREAN SECTION      OB History    Gravida  7   Para  4   Term  4   Preterm      AB  3   Living  4     SAB  3   TAB      Ectopic      Multiple      Live Births               Home Medications    Prior to Admission medications   Medication Sig Start Date End Date Taking? Authorizing Provider  acetaminophen (TYLENOL) 500 MG tablet Take 1,000 mg by mouth every 6 (six) hours as needed for pain.    [provider]  acetaZOLAMIDE (DIAMOX) 250 MG tablet Take 500 mg by mouth 2 (two) times daily.  04/21/18   [provider]  amoxicillin-clavulanate (AUGMENTIN) 875-125 MG tablet Take 1 tablet by mouth every 12 (twelve) hours for 10 days. 08/22/18 09/01/18  Zigmund Gottron, NP  atorvastatin (LIPITOR) 40 MG tablet Take 1 tablet (40 mg total) by mouth daily. 11/05/17   Alphonzo Grieve, MD  Blood Glucose Monitoring Suppl (ACCU-CHEK NANO SMARTVIEW) w/Device KIT 1 kit 11/05/17   Alphonzo Grieve, MD  brimonidine (ALPHAGAN) 0.2 % ophthalmic solution Place 1 drop  into the left eye 3 (three) times daily.     [provider]  Continuous Blood Gluc Receiver (FREESTYLE LIBRE 14 DAY READER) DEVI Inject 1 Device into the skin See admin instructions. 11/20/17   Alphonzo Grieve, MD  Continuous Blood Gluc Sensor (FREESTYLE LIBRE 14 DAY SENSOR) MISC 1 each by Does not apply route 4 (four) times daily. 10/31/17   Alphonzo Grieve, MD  diclofenac sodium (VOLTAREN) 1 % GEL Apply 2 g topically 4 (four) times daily. Patient not taking: Reported on 06/12/2018 11/05/17   Alphonzo Grieve, MD  dorzolamide-timolol (COSOPT) 22.3-6.8 MG/ML ophthalmic solution Place 1 drop into the left eye 2 (two) times daily.    [provider]  DULoxetine (CYMBALTA) 60 MG capsule Take 1 capsule (60 mg total) by mouth daily. 05/20/18 05/20/19  Alphonzo Grieve, MD   empagliflozin (JARDIANCE) 25 MG TABS tablet Take 25 mg by mouth daily.    [provider]  ferrous sulfate 325 (65 FE) MG tablet Take 1 tablet (325 mg total) by mouth daily. Patient not taking: Reported on 06/12/2018 04/29/17 04/29/18  Alphonzo Grieve, MD  folic acid (FOLVITE) 1 MG tablet Take 1 tablet (1 mg total) by mouth daily. Patient not taking: Reported on 06/12/2018 11/05/17   Alphonzo Grieve, MD  furosemide (LASIX) 20 MG tablet Take 1 tablet (20 mg total) by mouth daily as needed. Patient taking differently: Take 20 mg by mouth daily as needed for fluid.  05/20/18 05/20/19  Alphonzo Grieve, MD  glucose blood (ACCU-CHEK SMARTVIEW) test strip CHECK BLOOD SUGAR 4 TIMES DAILY BEFORE MEALS AND AT BEDTIME 05/20/18   Alphonzo Grieve, MD  ibuprofen (ADVIL,MOTRIN) 800 MG tablet Take 800 mg by mouth every 6 (six) hours as needed for mild pain.    [provider]  Insulin NPH, Human,, Isophane, (HUMULIN N) 100 UNIT/ML Kiwkpen INJECT 18 UNITS SUBCUTANEOUSLY EVERY MORNING AND 35 UNITS EVERY EVENING. Diag code E11.9 07/06/18   Alphonzo Grieve, MD  Insulin Pen Needle 31G X 5 MM MISC 1 Dose by Does not apply route 2 (two) times daily. 05/20/18   Alphonzo Grieve, MD  ketorolac (ACULAR) 0.5 % ophthalmic solution Place 1 drop into both eyes 4 (four) times daily.    [provider]  Lancets (ACCU-CHEK SOFT TOUCH) lancets Check Blood Sugar 4 times daily before meals and at bedtime. Dx code: E11.321 05/20/18   Alphonzo Grieve, MD  liraglutide (VICTOZA) 18 MG/3ML SOPN Inject 0.1 mLs (0.6 mg total) into the skin daily. After 1 week increase to 0.29m (1.251mtotal) into skin daily Patient taking differently: Inject 1.2 mg into the skin daily.  05/20/18   SvAlphonzo GrieveMD  lisinopril (PRINIVIL,ZESTRIL) 10 MG tablet Take 1 tablet (10 mg total) by mouth daily. 05/20/18   SvAlphonzo GrieveMD  metFORMIN (GLUCOPHAGE) 1000 MG tablet TAKE 1 TABLET (1,000 MG TOTAL) BY MOUTH 2 (TWO) TIMES DAILY WITH A MEAL.  06/11/18   SvAlphonzo GrieveMD  methotrexate (RHEUMATREX) 2.5 MG tablet Take 20 mg by mouth once a week. Caution:Chemotherapy. Protect from light.    [provider]  naproxen sodium (ALEVE) 220 MG tablet Take 220 mg by mouth daily as needed (pain).    [provider]  sulfamethoxazole-trimethoprim (BACTRIM DS) 800-160 MG tablet Take 1 tablet by mouth 2 (two) times daily for 10 days. 08/22/18 09/01/18  BuZigmund GottronNP  vitamin C (ASCORBIC ACID) 500 MG tablet Take 1 tablet (500 mg total) by mouth daily. 11/05/17   SvAlphonzo GrieveMD  Vitamin  D, Cholecalciferol, 1000 units CAPS Take 1,000 mg by mouth daily with breakfast. 11/05/17   Alphonzo Grieve, MD    Family History No family history on file.  Social History Social History   Tobacco Use  . Smoking status: Never Smoker  . Smokeless tobacco: Never Used  Substance Use Topics  . Alcohol use: No    Alcohol/week: 0.0 standard drinks  . Drug use: No     Allergies   Patient has no known allergies.   Review of Systems Review of Systems   Physical Exam Triage Vital Signs ED Triage Vitals  Enc Vitals Group     BP 08/22/18 1659 (!) 156/95     Pulse Rate 08/22/18 1659 87     Resp 08/22/18 1659 16     Temp 08/22/18 1659 98.4 F (36.9 C)     Temp Source 08/22/18 1659 Oral     SpO2 08/22/18 1659 97 %     Weight --      Height --      Head Circumference --      Peak Flow --      Pain Score 08/22/18 1700 9     Pain Loc --      Pain Edu? --      Excl. in [place]? --    No data found.  Updated Vital Signs BP (!) 156/95 (BP Location: Right Arm)   Pulse 87   Temp 98.4 F (36.9 C) (Oral)   Resp 16   SpO2 97%    Physical Exam Constitutional:      General: She is not in acute distress.    Appearance: She is well-developed.  HENT:     Head:      Comments: Red firm tight skin just lateral to right nares with swelling visible within right nares as well as under top lip above teeth #6 and #7; tender; no active  drainage; affected teeth have large caries  Cardiovascular:     Rate and Rhythm: Normal rate and regular rhythm.     Heart sounds: Normal heart sounds.  Pulmonary:     Effort: Pulmonary effort is normal.     Breath sounds: Normal breath sounds.  Skin:    General: Skin is warm and dry.  Neurological:     Mental Status: She is alert and oriented to person, place, and time.      UC Treatments / Results  Labs (all labs ordered are listed, but only abnormal results are displayed) Labs Reviewed - No data to display  EKG None  Radiology No results found.  Procedures Procedures (including critical care time)  Medications Ordered in UC Medications - No data to display  Initial Impression / Assessment and Plan / UC Course  I have reviewed the triage vital signs and the nursing notes.  Pertinent labs & imaging results that were available during my care of the patient were reviewed by me and considered in my medical decision making (see chart for details).     Dental abscess extending up into nares region vs soft tissue infection in addition to dental infection discussed and considered. Will change to cover oral and soft tissue infection with both augmentin and bactrim. Recommend er if worsening or no improvement as may require imaging to localize infectious source or for incision and drainage. Encouraged follow up with dentist, patient states has appointment 5/14. Patient and daughter verbalized understanding and agreeable to plan.   Final Clinical Impressions(s) / UC Diagnoses   Final  diagnoses:  Pain, dental  Abscess     Discharge Instructions     Warm compresses.  Ibuprofen as prescribed to help with pain.  Please stop previously prescribed antibiotics and start the two I have sent.  Please follow up with dentist for definitive treatment.  If any worsening of symptoms please go to the ER.     ED Prescriptions    Medication Sig Dispense Auth. Provider    amoxicillin-clavulanate (AUGMENTIN) 875-125 MG tablet Take 1 tablet by mouth every 12 (twelve) hours for 10 days. 20 tablet Augusto Gamble B, NP   sulfamethoxazole-trimethoprim (BACTRIM DS) 800-160 MG tablet Take 1 tablet by mouth 2 (two) times daily for 10 days. 20 tablet Zigmund Gottron, NP     Controlled Substance Prescriptions [place] Controlled Substance Registry consulted? Not Applicable   Zigmund Gottron, NP 08/22/18 [facility]

## 2018-08-22 NOTE — ED Triage Notes (Signed)
PT  Has had a tooth issue for about a week but now the tooth is still hurting and her face is swelling. Pt has gone to the dentist but the medication is not working she stated

## 2018-08-23 ENCOUNTER — Emergency Department (HOSPITAL_COMMUNITY)
Admission: EM | Admit: 2018-08-23 | Discharge: 2018-08-23 | Disposition: A | Discharge status: Home / Self Care | Payer: Medicaid Other | Source: Home / Self Care | Attending: Emergency Medicine | Admitting: Emergency Medicine

## 2018-08-23 ENCOUNTER — Encounter (HOSPITAL_COMMUNITY): Payer: Self-pay | Admitting: Emergency Medicine

## 2018-08-23 ENCOUNTER — Other Ambulatory Visit: Payer: Self-pay

## 2018-08-23 ENCOUNTER — Encounter (HOSPITAL_COMMUNITY): Payer: Self-pay

## 2018-08-23 DIAGNOSIS — L03211 Cellulitis of face: Secondary | ICD-10-CM

## 2018-08-23 DIAGNOSIS — K047 Periapical abscess without sinus: Secondary | ICD-10-CM

## 2018-08-23 MED ORDER — HYDROCODONE-ACETAMINOPHEN 5-325 MG PO TABS
2.0000 | ORAL_TABLET | Freq: Once | ORAL | Status: AC
  Administered 2018-08-23: 2 via ORAL
  Filled 2018-08-23: qty 2

## 2018-08-23 MED ORDER — KETOROLAC TROMETHAMINE 15 MG/ML IJ SOLN
15.0000 mg | Freq: Once | INTRAMUSCULAR | Status: AC
  Administered 2018-08-23: 01:00:00 15 mg via INTRAVENOUS
  Filled 2018-08-23: qty 1

## 2018-08-23 MED ORDER — CLINDAMYCIN PHOSPHATE 600 MG/50ML IV SOLN
600.0000 mg | Freq: Once | INTRAVENOUS | Status: AC
  Administered 2018-08-23: 600 mg via INTRAVENOUS
  Filled 2018-08-23: qty 50

## 2018-08-23 NOTE — ED Triage Notes (Signed)
Pt brought in by son.  Was seen yesterday for swelling, pian, redness to right side of face.  Son requesting to go back with patient so he can communicate pts requests.  Antibiotics was started yesterday but son states pain is unbearable.

## 2018-08-23 NOTE — ED Provider Notes (Signed)
[facility] EMERGENCY DEPARTMENT Provider Note   CSN: [id number] Arrival date & time: 08/22/18  2029    History   Chief Complaint Chief Complaint  Patient presents with  . Facial Swelling    HPI Jennifer [name] is a 48 y.o. female.     The history is provided by the patient.  Dental Pain  Location:  Upper Upper teeth location:  9/LU central incisor and 10/LU lateral incisor Quality:  Aching Severity:  Severe Onset quality:  Gradual Duration:  1 week Timing:  Constant Progression:  Worsening Chronicity:  New Context: poor dentition   Previous work-up:  Dental exam Relieved by:  Nothing Worsened by:  Nothing Ineffective treatments:  None tried Associated symptoms: facial swelling   Associated symptoms: no fever and no neck swelling   Risk factors: diabetes   facial cellulitis and slight swelling of the right nasal labial fold. Patient was seen by dentistry and will see them again Thursday.  Placed on Augmentin and Bactrim this afternoon and has only had one dose.    Past Medical History:  Diagnosis Date  . Anemia, iron deficiency   . Blindness of left eye   . Decreased visual acuity    Left eye  . 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 Hair loss   . History of fracture of clavicle 05/18/2015  . Hyperlipidemia   . Pap smear abnormality of cervix with LGSIL   . Routine/ritual circumcision   . Type II diabetes mellitus ([place])   . Uveitis     Patient Active Problem List   Diagnosis Date Noted  . Pain, dental 08/19/2018  . Vulvovaginitis 05/21/2018  . Dependent edema 01/01/2018  . Left anterior shoulder pain 07/06/2017  . Hypertension associated with diabetes ([place]) 07/23/2016  . 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 Fatigue 04/02/2016  . 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  Vitamin D deficiency 10/30/2014  . Iron deficiency anemia 05/13/2013  . Hair loss 03/24/2012  . Healthcare maintenance 02/26/2011  . Hyperlipidemia 05/05/2006  . Type II diabetes mellitus ([place]) 04/22/1998    Past Surgical History:  Procedure Laterality Date  . CESAREAN SECTION       OB History    Gravida  7   Para  4   Term  4   Preterm      AB  3   Living  4     SAB  3   TAB      Ectopic      Multiple      Live Births               Home Medications    Prior to Admission medications   Medication Sig Start Date End Date Taking? Authorizing Provider  acetaminophen (TYLENOL) 500 MG tablet Take 1,000 mg by mouth every 6 (six) hours as needed for pain.    [provider]  acetaZOLAMIDE (DIAMOX) 250 MG tablet Take 500 mg by mouth 2 (two) times daily.  04/21/18   [provider]  amoxicillin-clavulanate (AUGMENTIN) 875-125 MG tablet Take 1 tablet by mouth every 12 (twelve) hours for 10 days. 08/22/18 09/01/18  Zigmund Gottron, NP  atorvastatin (LIPITOR) 40 MG tablet Take 1 tablet (40 mg total) by mouth daily. 11/05/17   Alphonzo Grieve, MD  Blood Glucose Monitoring Suppl (ACCU-CHEK NANO  SMARTVIEW) w/Device KIT 1 kit 11/05/17   Alphonzo Grieve, MD  brimonidine (ALPHAGAN) 0.2 % ophthalmic solution Place 1 drop into the left eye 3 (three) times daily.     [provider]  Continuous Blood Gluc Receiver (FREESTYLE LIBRE 14 DAY READER) DEVI Inject 1 Device into the skin See admin instructions. 11/20/17   Alphonzo Grieve, MD  Continuous Blood Gluc Sensor (FREESTYLE LIBRE 14 DAY SENSOR) MISC 1 each by Does not apply route 4 (four) times daily. 10/31/17   Alphonzo Grieve, MD  diclofenac sodium (VOLTAREN) 1 % GEL Apply 2 g topically 4 (four) times daily. Patient not taking: Reported on 06/12/2018 11/05/17   Alphonzo Grieve, MD  dorzolamide-timolol (COSOPT) 22.3-6.8 MG/ML ophthalmic solution Place 1 drop into the left eye 2 (two) times daily.    [provider]  DULoxetine (CYMBALTA) 60 MG capsule Take 1 capsule (60 mg total) by mouth daily. 05/20/18 05/20/19  Alphonzo Grieve, MD  empagliflozin (JARDIANCE) 25 MG TABS tablet Take 25 mg by mouth daily.    [provider]  ferrous sulfate 325 (65 FE) MG tablet Take 1 tablet (325 mg total) by mouth daily. Patient not taking: Reported on 06/12/2018 04/29/17 04/29/18  Alphonzo Grieve, MD  folic acid (FOLVITE) 1 MG tablet Take 1 tablet (1 mg total) by mouth daily. Patient not taking: Reported on 06/12/2018 11/05/17   Alphonzo Grieve, MD  furosemide (LASIX) 20 MG tablet Take 1 tablet (20 mg total) by mouth daily as needed. Patient taking differently: Take 20 mg by mouth daily as needed for fluid.  05/20/18 05/20/19  Alphonzo Grieve, MD  glucose blood (ACCU-CHEK SMARTVIEW) test strip CHECK BLOOD SUGAR 4 TIMES DAILY BEFORE MEALS AND AT BEDTIME 05/20/18   Alphonzo Grieve, MD  ibuprofen (ADVIL,MOTRIN) 800 MG tablet Take 800 mg by mouth every 6 (six) hours as needed for mild pain.    [provider]  Insulin NPH, Human,, Isophane, (HUMULIN N) 100 UNIT/ML Kiwkpen INJECT 69 UNITS SUBCUTANEOUSLY EVERY MORNING AND 35 UNITS EVERY EVENING. Diag code E11.9 07/06/18   Alphonzo Grieve, MD  Insulin Pen Needle 31G X 5 MM MISC 1 Dose by Does not apply route 2 (two) times daily. 05/20/18   Alphonzo Grieve, MD  ketorolac (ACULAR) 0.5 % ophthalmic solution Place 1 drop into both eyes 4 (four) times daily.    [provider]  Lancets (ACCU-CHEK SOFT TOUCH) lancets Check Blood Sugar 4 times daily before meals and at bedtime. Dx code: E11.321 05/20/18   Alphonzo Grieve, MD  liraglutide (VICTOZA) 18 MG/3ML SOPN Inject 0.1 mLs (0.6 mg total) into the skin daily. After 1 week increase to 0.79m (1.229mtotal) into skin daily Patient taking differently: Inject 1.2 mg into the skin daily.  05/20/18   SvAlphonzo GrieveMD  lisinopril (PRINIVIL,ZESTRIL) 10 MG tablet Take 1 tablet (10 mg total) by mouth daily. 05/20/18    SvAlphonzo GrieveMD  metFORMIN (GLUCOPHAGE) 1000 MG tablet TAKE 1 TABLET (1,000 MG TOTAL) BY MOUTH 2 (TWO) TIMES DAILY WITH A MEAL. 06/11/18   SvAlphonzo GrieveMD  methotrexate (RHEUMATREX) 2.5 MG tablet Take 20 mg by mouth once a week. Caution:Chemotherapy. Protect from light.    [provider]  naproxen sodium (ALEVE) 220 MG tablet Take 220 mg by mouth daily as needed (pain).    [provider]  sulfamethoxazole-trimethoprim (BACTRIM DS) 800-160 MG tablet Take 1 tablet by mouth 2 (two) times daily for 10 days. 08/22/18 09/01/18  BuZigmund GottronNP  vitamin C (ASCORBIC  ACID) 500 MG tablet Take 1 tablet (500 mg total) by mouth daily. 11/05/17   Alphonzo Grieve, MD  Vitamin D, Cholecalciferol, 1000 units CAPS Take 1,000 mg by mouth daily with breakfast. 11/05/17   Alphonzo Grieve, MD    Family History No family history on file.  Social History Social History   Tobacco Use  . Smoking status: Never Smoker  . Smokeless tobacco: Never Used  Substance Use Topics  . Alcohol use: No    Alcohol/week: 0.0 standard drinks  . Drug use: No     Allergies   Patient has no known allergies.   Review of Systems Review of Systems  Constitutional: Negative for fever.  HENT: Positive for dental problem and facial swelling.   All other systems reviewed and are negative.    Physical Exam Updated Vital Signs BP (!) 162/75   Pulse 96   Temp 99.5 F (37.5 C) (Oral)   Ht '5\' 6"'  (1.676 m)   Wt 98.1 kg   SpO2 98%   BMI 34.91 kg/m   Physical Exam Vitals signs and nursing note reviewed.  Constitutional:      General: She is not in acute distress.    Appearance: She is normal weight.  HENT:     Head: Normocephalic and atraumatic.      Nose: Nose normal.     Mouth/Throat:     Dentition: Abnormal dentition. Dental caries present.  Eyes:     Conjunctiva/sclera: Conjunctivae normal.     Pupils: Pupils are equal, round, and reactive to light.  Neck:     Musculoskeletal:  Normal range of motion and neck supple.  Cardiovascular:     Rate and Rhythm: Normal rate and regular rhythm.     Pulses: Normal pulses.     Heart sounds: Normal heart sounds.  Pulmonary:     Effort: Pulmonary effort is normal.     Breath sounds: Normal breath sounds.  Abdominal:     General: Abdomen is flat. Bowel sounds are normal.     Tenderness: There is no abdominal tenderness. There is no guarding.  Musculoskeletal: Normal range of motion.  Skin:    General: Skin is warm and dry.     Capillary Refill: Capillary refill takes less than 2 seconds.  Neurological:     General: No focal deficit present.     Mental Status: She is alert and oriented to person, place, and time.  Psychiatric:        Mood and Affect: Mood normal.        Behavior: Behavior normal.      ED Treatments / Results  Labs (all labs ordered are listed, but only abnormal results are displayed) Results for orders placed or performed during the hospital encounter of 08/22/18  Lactic acid, plasma  Result Value Ref Range   Lactic Acid, Venous 1.2 0.5 - 1.9 mmol/L  Comprehensive metabolic panel  Result Value Ref Range   Sodium 135 135 - 145 mmol/L   Potassium 4.2 3.5 - 5.1 mmol/L   Chloride 102 98 - 111 mmol/L   CO2 23 22 - 32 mmol/L   Glucose, Bld 351 (H) 70 - 99 mg/dL   BUN 19 6 - 20 mg/dL   Creatinine, Ser 0.76 0.44 - 1.00 mg/dL   Calcium 8.8 (L) 8.9 - 10.3 mg/dL   Total Protein 7.1 6.5 - 8.1 g/dL   Albumin 3.0 (L) 3.5 - 5.0 g/dL   AST 14 (L) 15 - 41 U/L   ALT  14 0 - 44 U/L   Alkaline Phosphatase 117 38 - 126 U/L   Total Bilirubin 0.4 0.3 - 1.2 mg/dL   GFR calc non Af Amer >60 >60 mL/min   GFR calc Af Amer >60 >60 mL/min   Anion gap 10 5 - 15  CBC with Differential  Result Value Ref Range   WBC 5.3 4.0 - 10.5 K/uL   RBC 4.65 3.87 - 5.11 MIL/uL   Hemoglobin 12.4 12.0 - 15.0 g/dL   HCT 39.1 36.0 - 46.0 %   MCV 84.1 80.0 - 100.0 fL   MCH 26.7 26.0 - 34.0 pg   MCHC 31.7 30.0 - 36.0 g/dL   RDW  12.3 11.5 - 15.5 %   Platelets 177 150 - 400 K/uL   nRBC 0.0 0.0 - 0.2 %   Neutrophils Relative % 67 %   Neutro Abs 3.6 1.7 - 7.7 K/uL   Lymphocytes Relative 23 %   Lymphs Abs 1.2 0.7 - 4.0 K/uL   Monocytes Relative 6 %   Monocytes Absolute 0.3 0.1 - 1.0 K/uL   Eosinophils Relative 3 %   Eosinophils Absolute 0.2 0.0 - 0.5 K/uL   Basophils Relative 1 %   Basophils Absolute 0.0 0.0 - 0.1 K/uL   Immature Granulocytes 0 %   Abs Immature Granulocytes 0.01 0.00 - 0.07 K/uL  Urinalysis, Routine w reflex microscopic  Result Value Ref Range   Color, Urine YELLOW YELLOW   APPearance CLEAR CLEAR   Specific Gravity, Urine 1.033 (H) 1.005 - 1.030   pH 6.0 5.0 - 8.0   Glucose, UA >=500 (A) NEGATIVE mg/dL   Hgb urine dipstick SMALL (A) NEGATIVE   Bilirubin Urine NEGATIVE NEGATIVE   Ketones, ur NEGATIVE NEGATIVE mg/dL   Protein, ur 30 (A) NEGATIVE mg/dL   Nitrite NEGATIVE NEGATIVE   Leukocytes,Ua TRACE (A) NEGATIVE   RBC / HPF 6-10 0 - 5 RBC/hpf   WBC, UA 6-10 0 - 5 WBC/hpf   Bacteria, UA NONE SEEN NONE SEEN   Squamous Epithelial / LPF 0-5 0 - 5   Ct Maxillofacial W Contrast  Result Date: 08/23/2018 CLINICAL DATA:  Initial evaluation for acute right facial pain, swelling. EXAM: CT MAXILLOFACIAL WITH CONTRAST TECHNIQUE: Multidetector CT imaging of the maxillofacial structures was performed with intravenous contrast. Multiplanar CT image reconstructions were also generated. CONTRAST:  160m OMNIPAQUE IOHEXOL 300 MG/ML  SOLN COMPARISON:  None available. FINDINGS: Osseous: No acute osseous abnormality about the face. Orbits: Globes and orbital soft tissues within normal limits. No evidence for intraorbital or postseptal cellulitis. Sinuses: Paranasal sinuses are clear. Mastoid air cells and middle ear cavities are well pneumatized and free of fluid. Soft tissues: Focal soft tissue swelling with inflammatory stranding seen within the right pre maxillary soft tissues adjacent to the right nasal labial  fold, compatible with infection/cellulitis. Irregular rim enhancing collection within this region measures 1.8 x 1.6 x 1.7 cm, compatible with abscess (series 3, image 42). There is dehiscence of the underlying alveolar ridge about the remaining dentition, suggesting an odontogenic origin. Mild mass effect on the adjacent right nasal vestibule. Remainder of the soft tissues of the face otherwise unremarkable. Salivary glands within normal limits. No adenopathy within the visualized neck. Limited intracranial: Unremarkable. IMPRESSION: 1.8 x 1.6 x 1.7 cm abscess positioned at the right nasolabial fold. Associated inflammatory stranding within the adjacent right pre maxillary soft tissues consistent with associated cellulitis. Dehiscence of the underlying alveolar ridge about the remaining dentition suggest an odontogenic origin.  Electronically Signed   By: Jeannine Boga M.D.   On: 08/23/2018 00:08    EKG None  Radiology Ct Maxillofacial W Contrast  Result Date: 08/23/2018 CLINICAL DATA:  Initial evaluation for acute right facial pain, swelling. EXAM: CT MAXILLOFACIAL WITH CONTRAST TECHNIQUE: Multidetector CT imaging of the maxillofacial structures was performed with intravenous contrast. Multiplanar CT image reconstructions were also generated. CONTRAST:  173m OMNIPAQUE IOHEXOL 300 MG/ML  SOLN COMPARISON:  None available. FINDINGS: Osseous: No acute osseous abnormality about the face. Orbits: Globes and orbital soft tissues within normal limits. No evidence for intraorbital or postseptal cellulitis. Sinuses: Paranasal sinuses are clear. Mastoid air cells and middle ear cavities are well pneumatized and free of fluid. Soft tissues: Focal soft tissue swelling with inflammatory stranding seen within the right pre maxillary soft tissues adjacent to the right nasal labial fold, compatible with infection/cellulitis. Irregular rim enhancing collection within this region measures 1.8 x 1.6 x 1.7 cm,  compatible with abscess (series 3, image 42). There is dehiscence of the underlying alveolar ridge about the remaining dentition, suggesting an odontogenic origin. Mild mass effect on the adjacent right nasal vestibule. Remainder of the soft tissues of the face otherwise unremarkable. Salivary glands within normal limits. No adenopathy within the visualized neck. Limited intracranial: Unremarkable. IMPRESSION: 1.8 x 1.6 x 1.7 cm abscess positioned at the right nasolabial fold. Associated inflammatory stranding within the adjacent right pre maxillary soft tissues consistent with associated cellulitis. Dehiscence of the underlying alveolar ridge about the remaining dentition suggest an odontogenic origin. Electronically Signed   By: BJeannine BogaM.D.   On: 08/23/2018 00:08    Procedures Procedures (including critical care time)  Medications Ordered in ED Medications  sodium chloride flush (NS) 0.9 % injection 3 mL (3 mLs Intravenous Given 08/22/18 2323)  iohexol (OMNIPAQUE) 300 MG/ML solution 100 mL (100 mLs Intravenous Contrast Given 08/22/18 2344)  ketorolac (TORADOL) 15 MG/ML injection 15 mg (15 mg Intravenous Given 08/23/18 0048)     Patient is a poorly controlled diabetic, with cellulitis of a limited area of the face. It is not advisable to go through this area to get the odontogenic abscess.  Will refer to Oral surgery.  Augment bactrim is good coverage.  Will give one dose of IV antibiotics.     Final Clinical Impressions(s) / ED Diagnoses   Return for intractable cough, coughing up blood,fevers >100.4 unrelieved by medication, shortness of breath, intractable vomiting, chest pain, shortness of breath, weakness,numbness, changes in speech, facial asymmetry,abdominal pain, passing out,Inability to tolerate liquids or food, cough, altered mental status or any concerns. No signs of systemic illness or infection. The patient is nontoxic-appearing on exam and vital signs are within  normal limits.   I have reviewed the triage vital signs and the nursing notes. Pertinent labs &imaging results that were available during my care of the patient were reviewed by me and considered in my medical decision making (see chart for details).  After history, exam, and medical workup I feel the patient has been appropriately medically screened and is safe for discharge home. Pertinent diagnoses were discussed with the patient. Patient was given return precautions.    Elanora Quin, MD 08/23/18 0110

## 2018-08-23 NOTE — Discharge Instructions (Signed)
You can take Tylenol or Ibuprofen as directed for pain. You can alternate Tylenol and Ibuprofen every 4 hours. If you take Tylenol at 1pm, then you can take Ibuprofen at 5pm. Then you can take Tylenol again at 9pm.   Continue taking antibiotics as directed  Follow-up with referred oral surgeon tomorrow.  Return to the emergency department for high fever, inability to swallow your saliva, liquids, inability to breathe, vomiting, swelling of your lips or tongue or any other worsening or concerning symptoms.

## 2018-08-23 NOTE — ED Provider Notes (Signed)
[facility] Provider Note   CSN: [id number] Arrival date & time: 08/23/18  1947    History   Chief Complaint Chief Complaint  Patient presents with   Cellulitis    HPI Jennifer [name] is a 48 y.o. female past medical history of anemia, glaucoma, hyperlipidemia who presents today for evaluation of continued and persistent right-sided facial edema, warmth, erythema.  Patient was seen last night for evaluation of same symptoms.  At that time, she had a CT scan done that showed abscess in the nasolabial fold with surrounding cellulitis likely odontogenic in  source.  Patient was given a dose of IV antibiotics.  She was stable and was discharged home with Augmentin which she states she has taken this morning.  She comes back in today because of concerns that this area is still swollen, red, erythematous.  Son is here to provide secondary history with interpretation.  He states that the area had stayed the same since yesterday. He states that it felt a little bit more swollen this morning.  He reports that she felt like the abscess started draining and that the swelling started going down.  He states that patient is still having pain, which is primarily their reason for coming back today.  She took 1 dose of ibuprofen earlier this morning but has not taken any since.  Patient states she is not having any fevers, difficulty swallowing her secretions, vomiting, difficulty breathing, swelling of her tongue or lips.  Son is concerned that she did not get any better after antibiotics last night and this morning.    The history is provided by the patient and a relative. No language interpreter was used (Offered patient language interpreter but she declined and asked that her son translate).    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   Annotation: secondary to uveitis of unknown etiology Qualifier: Diagnosis of  By: Hilma Favors  DO, Beth     Hair loss    History of fracture of clavicle 05/18/2015   Hyperlipidemia    Pap smear abnormality of cervix with LGSIL    Routine/ritual circumcision    Type II diabetes mellitus ([place])    Uveitis     Patient Active Problem List   Diagnosis Date Noted   Pain, dental 08/19/2018   Vulvovaginitis 05/21/2018   Dependent edema 01/01/2018   Left anterior shoulder pain 07/06/2017   Hypertension associated with diabetes ([place]) 07/23/2016   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 Authorizing Provider  acetaminophen (TYLENOL) 500 MG tablet Take 1,000 mg by mouth every 6 (six) hours as needed for pain.    [provider]  acetaZOLAMIDE (DIAMOX) 250 MG tablet Take 500 mg by mouth 2 (two) times daily.  04/21/18   [provider]  amoxicillin-clavulanate (AUGMENTIN) 875-125 MG tablet Take 1 tablet by mouth every 12 (twelve) hours for 10 days. 08/22/18 09/01/18  Zigmund Gottron, NP  atorvastatin (LIPITOR) 40 MG tablet Take 1 tablet (40 mg total) by mouth daily. 11/05/17   Alphonzo Grieve, MD  Blood Glucose Monitoring Suppl (ACCU-CHEK NANO SMARTVIEW) w/Device KIT 1 kit 11/05/17   Alphonzo Grieve, MD  brimonidine (ALPHAGAN) 0.2 % ophthalmic solution Place 1 drop into the left eye 3 (three) times daily.     [provider]  Continuous Blood Gluc Receiver (FREESTYLE LIBRE 14 DAY READER) DEVI Inject 1 Device into the skin See admin instructions. 11/20/17   Alphonzo Grieve, MD  Continuous Blood Gluc Sensor (FREESTYLE LIBRE 14 DAY SENSOR) MISC 1 each by Does not apply route 4 (four) times daily. 10/31/17   Alphonzo Grieve, MD  diclofenac sodium (VOLTAREN) 1 % GEL Apply 2 g topically 4 (four) times daily. Patient not taking: Reported on 06/12/2018 11/05/17   Alphonzo Grieve, MD  dorzolamide-timolol (COSOPT) 22.3-6.8 MG/ML ophthalmic solution Place 1 drop into the left eye 2 (two) times daily.    [provider]  DULoxetine (CYMBALTA) 60 MG capsule Take 1 capsule (60 mg total) by mouth daily. 05/20/18 05/20/19  Alphonzo Grieve, MD  empagliflozin (JARDIANCE) 25 MG TABS tablet Take 25 mg by mouth daily.    [provider]  ferrous sulfate 325 (65 FE) MG tablet Take 1 tablet (325 mg total) by mouth daily. Patient not taking: Reported on 06/12/2018 04/29/17 04/29/18  Alphonzo Grieve, MD  folic acid (FOLVITE) 1 MG tablet Take 1 tablet (1 mg total) by mouth daily. Patient not taking: Reported on 06/12/2018 11/05/17   Alphonzo Grieve, MD  furosemide (LASIX) 20 MG tablet Take 1 tablet (20 mg total) by mouth daily as needed. Patient taking differently: Take 20 mg by mouth daily as needed for fluid.  05/20/18 05/20/19  Alphonzo Grieve, MD  glucose blood (ACCU-CHEK SMARTVIEW) test strip CHECK BLOOD SUGAR 4 TIMES DAILY BEFORE MEALS AND AT BEDTIME 05/20/18   Alphonzo Grieve, MD  ibuprofen (ADVIL,MOTRIN) 800 MG tablet Take 800 mg by mouth every 6 (six) hours as needed for mild pain.    [provider]  Insulin NPH, Human,, Isophane, (HUMULIN N) 100 UNIT/ML Kiwkpen INJECT 58 UNITS SUBCUTANEOUSLY EVERY MORNING AND 35 UNITS EVERY EVENING. Diag code E11.9 07/06/18   Alphonzo Grieve, MD    Insulin Pen Needle 31G X 5 MM MISC 1 Dose by Does not apply route 2 (two) times daily. 05/20/18   Alphonzo Grieve, MD  ketorolac (ACULAR) 0.5 % ophthalmic solution Place 1 drop into both eyes 4 (four) times daily.    [provider]  Lancets (ACCU-CHEK SOFT TOUCH) lancets Check Blood Sugar 4 times daily before meals and at bedtime. Dx code: E11.321 05/20/18   Alphonzo Grieve, MD  liraglutide (VICTOZA) 18 MG/3ML SOPN Inject 0.1 mLs (0.6 mg total) into the skin daily. After 1 week increase to 0.10m (1.224mtotal) into skin daily Patient taking differently: Inject 1.2 mg into the skin daily.  05/20/18   SvAlphonzo GrieveMD  lisinopril (PRINIVIL,ZESTRIL)  10 MG tablet Take 1 tablet (10 mg total) by mouth daily. 05/20/18   Alphonzo Grieve, MD  metFORMIN (GLUCOPHAGE) 1000 MG tablet TAKE 1 TABLET (1,000 MG TOTAL) BY MOUTH 2 (TWO) TIMES DAILY WITH A MEAL. 06/11/18   Alphonzo Grieve, MD  methotrexate (RHEUMATREX) 2.5 MG tablet Take 20 mg by mouth once a week. Caution:Chemotherapy. Protect from light.    [provider]  naproxen sodium (ALEVE) 220 MG tablet Take 220 mg by mouth daily as needed (pain).    [provider]  sulfamethoxazole-trimethoprim (BACTRIM DS) 800-160 MG tablet Take 1 tablet by mouth 2 (two) times daily for 10 days. 08/22/18 09/01/18  Zigmund Gottron, NP  vitamin C (ASCORBIC ACID) 500 MG tablet Take 1 tablet (500 mg total) by mouth daily. 11/05/17   Alphonzo Grieve, MD  Vitamin D, Cholecalciferol, 1000 units CAPS Take 1,000 mg by mouth daily with breakfast. 11/05/17   Alphonzo Grieve, MD    Family History History reviewed. No pertinent family history.  Social History Social History   Tobacco Use   Smoking status: Never Smoker   Smokeless tobacco: Never Used  Substance Use Topics   Alcohol use: No    Alcohol/week: 0.0 standard drinks   Drug use: No     Allergies   Patient has no known allergies.   Review of Systems Review of Systems  Constitutional:  Negative for fever.  HENT: Positive for dental problem and facial swelling. Negative for trouble swallowing.   Respiratory: Negative for shortness of breath.   Cardiovascular: Negative for chest pain.  Gastrointestinal: Negative for abdominal pain, nausea and vomiting.  Genitourinary: Negative for hematuria.  All other systems reviewed and are negative.    Physical Exam Updated Vital Signs BP 138/70 (BP Location: Right Arm)    Pulse 87    Temp 98.8 F (37.1 C) (Oral)    Resp 18    SpO2 100%   Physical Exam Vitals signs and nursing note reviewed.  Constitutional:      Appearance: Normal appearance. She is well-developed.  HENT:     Head: Normocephalic and atraumatic.      Nose: Congestion present.     Right Turbinates: Swollen.     Left Turbinates: Not swollen.     Mouth/Throat:     Mouth: Mucous membranes are moist.     Comments: Airway is patent, phonation is intact. Uvula is midline.  No trismus. Tooth number 9 with dental cary. No obvious fluctuance.  No trismus.  No swelling noted to floor of mouth. Eyes:     General: Lids are normal.     Conjunctiva/sclera: Conjunctivae normal.     Pupils: Pupils are equal, round, and reactive to light.  Neck:     Musculoskeletal: Full passive range of motion without pain. No edema.     Comments: No evidence of edema or crepitus. Cardiovascular:     Rate and Rhythm: Normal rate and regular rhythm.     Pulses: Normal pulses.     Heart sounds: Normal heart sounds. No murmur. No friction rub. No gallop.   Pulmonary:     Effort: Pulmonary effort is normal.     Breath sounds: Normal breath sounds.     Comments: Lungs clear to auscultation bilaterally.  Symmetric chest rise.  No wheezing, rales, rhonchi. Abdominal:     Palpations: Abdomen is soft. Abdomen is not rigid.     Tenderness: There is no abdominal tenderness. There is no guarding.  Musculoskeletal: Normal range of motion.  Skin:    General: Skin is warm and dry.     Capillary  Refill: Capillary refill takes less than 2 seconds.  Neurological:     Mental Status: She is alert and oriented to person, place, and time.  Psychiatric:        Speech: Speech normal.      ED Treatments / Results  Labs (all labs ordered are listed, but only abnormal results are displayed) Labs Reviewed - No data to display  EKG None  Radiology Ct Maxillofacial W Contrast  Result Date: 08/23/2018 CLINICAL DATA:  Initial evaluation for acute right facial pain, swelling. EXAM: CT MAXILLOFACIAL WITH CONTRAST TECHNIQUE: Multidetector CT imaging of the maxillofacial structures was performed with intravenous contrast. Multiplanar CT image reconstructions were also generated. CONTRAST:  126m OMNIPAQUE IOHEXOL 300 MG/ML  SOLN COMPARISON:  None available. FINDINGS: Osseous: No acute osseous abnormality about the face. Orbits: Globes and orbital soft tissues within normal limits. No evidence for intraorbital or postseptal cellulitis. Sinuses: Paranasal sinuses are clear. Mastoid air cells and middle ear cavities are well pneumatized and free of fluid. Soft tissues: Focal soft tissue swelling with inflammatory stranding seen within the right pre maxillary soft tissues adjacent to the right nasal labial fold, compatible with infection/cellulitis. Irregular rim enhancing collection within this region measures 1.8 x 1.6 x 1.7 cm, compatible with abscess (series 3, image 42). There is dehiscence of the underlying alveolar ridge about the remaining dentition, suggesting an odontogenic origin. Mild mass effect on the adjacent right nasal vestibule. Remainder of the soft tissues of the face otherwise unremarkable. Salivary glands within normal limits. No adenopathy within the visualized neck. Limited intracranial: Unremarkable. IMPRESSION: 1.8 x 1.6 x 1.7 cm abscess positioned at the right nasolabial fold. Associated inflammatory stranding within the adjacent right pre maxillary soft tissues consistent with  associated cellulitis. Dehiscence of the underlying alveolar ridge about the remaining dentition suggest an odontogenic origin. Electronically Signed   By: BJeannine BogaM.D.   On: 08/23/2018 00:08    Procedures Procedures (including critical care time)  Medications Ordered in ED Medications  HYDROcodone-acetaminophen (NORCO/VICODIN) 5-325 MG per tablet 2 tablet (2 tablets Oral Given 08/23/18 2112)     Initial Impression / Assessment and Plan / ED Course  I have reviewed the triage vital signs and the nursing notes.  Pertinent labs & imaging results that were available during my care of the patient were reviewed by me and considered in my medical decision making (see chart for details).        48year old female who presents for evaluation of facial swelling.  Was seen here yesterday for the same thing.  CT scan done at that time showed a 1.8 x 1.6 x 1.7 cm abscess positioned at the right nasolabial fold associated with inflammatory stranding within the adjacent right premaxillary soft tissues consistent with associated cellulitis.  Suspect that there was odontogenic origin.  Patient was given dose of antibiotics and discharged home with Augmentin.  Patient was given follow-up with oral surgeon.  She comes back in today with son for concern that this is still persisting. Patient is afebrile, non-toxic appearing, sitting comfortably on examination table. Vital signs reviewed and stable.  On exam, she has a 1 cm area of warmth and erythema noted to right side of face with some overlying edema.  Additionally, tooth #9 on the left upper side appears to have a dental carry.  No Trismus.  No swelling noted to floor of mouth.  No oral  angioedema.  No swelling noted to neck.  Patient is able to talk without any difficulty.  No evidence of respiratory distress.  Extensively reviewed patient's work-up from yesterday.  At the time, her blood work appeared normal.  CT scan showed 1 cm abscess  positions at the right nasolabial fold associated with cellulitis.  Likely suspect odontogenic origin.  I had an extensive discussion with both patient and son.  Patient actually states that she feels like the swelling has gone down.  She states that she was more concerned because she took the antibiotics and it did not resolve.  Additionally, son states that this is why he brought her in because he is concerned that the symptoms did not resolve completely after taking antibiotics last night.  Patient has not had any fever, trouble tolerating secretions, tolerating p.o. I discussed with the patient and son that her symptoms would take a few days to resolve and would not get better with 1 dose of antibiotics.  At this time, her exam is not concerning for peritonsillar abscess, Ludwig's angina.  She has no evidence of systemic infection that would be concerning.  No indication for repeat imaging.  Additionally, her vitals are reassuring.  No indication for repeat blood work.  Patient also complaining of pain.  She has been only taking ibuprofen and only had 1 dose today.  Will give dose of pain medication and reassess.  Patient after pain medication here in the ED.  Patient reports that pain is improved slightly.  Vitals are stable.  She has been able to tolerate p.o. without any difficulty.  At this time, no indication to repeat imaging.  Discussed with both patient and son nature of dental abscess and encouraged continued use of antibiotics.  Additionally, recommend follow-up with oral surgeon as previously discussed. At this time, patient exhibits no emergent life-threatening condition that require further evaluation in ED or admission. Patient had ample opportunity for questions and discussion. All patient's questions were answered with full understanding. Strict return precautions discussed. Patient expresses understanding and agreement to plan.   Portions of this note were generated with Theatre manager. Dictation errors may occur despite best attempts at proofreading.    Final Clinical Impressions(s) / ED Diagnoses   Final diagnoses:  Cellulitis of face  Dental abscess    ED Discharge Orders    None       Desma Mcgregor 08/24/18 2214    Sherwood Gambler, MD 08/27/18 1246

## 2018-08-24 ENCOUNTER — Encounter: Payer: Self-pay | Admitting: Internal Medicine

## 2018-08-24 DIAGNOSIS — R319 Hematuria, unspecified: Secondary | ICD-10-CM | POA: Insufficient documentation

## 2018-09-19 ENCOUNTER — Other Ambulatory Visit: Payer: Self-pay

## 2018-09-19 ENCOUNTER — Encounter (HOSPITAL_COMMUNITY): Payer: Self-pay | Admitting: Pharmacy Technician

## 2018-09-19 ENCOUNTER — Inpatient Hospital Stay (HOSPITAL_COMMUNITY)
Admission: EM | Admit: 2018-09-19 | Discharge: 2018-09-21 | DRG: 310 | Disposition: A | Discharge status: Home / Self Care | Payer: Medicaid Other | Attending: Internal Medicine | Admitting: Internal Medicine

## 2018-09-19 ENCOUNTER — Emergency Department (HOSPITAL_COMMUNITY): Payer: Medicaid Other

## 2018-09-19 DIAGNOSIS — Z821 Family history of blindness and visual loss: Secondary | ICD-10-CM

## 2018-09-19 DIAGNOSIS — I4891 Unspecified atrial fibrillation: Secondary | ICD-10-CM | POA: Diagnosis present

## 2018-09-19 DIAGNOSIS — G43909 Migraine, unspecified, not intractable, without status migrainosus: Secondary | ICD-10-CM | POA: Diagnosis present

## 2018-09-19 DIAGNOSIS — H5462 Unqualified visual loss, left eye, normal vision right eye: Secondary | ICD-10-CM | POA: Diagnosis present

## 2018-09-19 DIAGNOSIS — H2512 Age-related nuclear cataract, left eye: Secondary | ICD-10-CM | POA: Diagnosis present

## 2018-09-19 DIAGNOSIS — E1169 Type 2 diabetes mellitus with other specified complication: Secondary | ICD-10-CM | POA: Diagnosis present

## 2018-09-19 DIAGNOSIS — E669 Obesity, unspecified: Secondary | ICD-10-CM | POA: Diagnosis present

## 2018-09-19 DIAGNOSIS — Z961 Presence of intraocular lens: Secondary | ICD-10-CM | POA: Diagnosis present

## 2018-09-19 DIAGNOSIS — E1165 Type 2 diabetes mellitus with hyperglycemia: Secondary | ICD-10-CM | POA: Diagnosis present

## 2018-09-19 DIAGNOSIS — I4892 Unspecified atrial flutter: Secondary | ICD-10-CM | POA: Diagnosis present

## 2018-09-19 DIAGNOSIS — E119 Type 2 diabetes mellitus without complications: Secondary | ICD-10-CM

## 2018-09-19 DIAGNOSIS — R519 Headache, unspecified: Secondary | ICD-10-CM

## 2018-09-19 DIAGNOSIS — Z79899 Other long term (current) drug therapy: Secondary | ICD-10-CM

## 2018-09-19 DIAGNOSIS — I152 Hypertension secondary to endocrine disorders: Secondary | ICD-10-CM | POA: Diagnosis present

## 2018-09-19 DIAGNOSIS — I480 Paroxysmal atrial fibrillation: Secondary | ICD-10-CM | POA: Diagnosis present

## 2018-09-19 DIAGNOSIS — E785 Hyperlipidemia, unspecified: Secondary | ICD-10-CM | POA: Diagnosis present

## 2018-09-19 DIAGNOSIS — E1159 Type 2 diabetes mellitus with other circulatory complications: Secondary | ICD-10-CM | POA: Diagnosis present

## 2018-09-19 DIAGNOSIS — Z833 Family history of diabetes mellitus: Secondary | ICD-10-CM

## 2018-09-19 DIAGNOSIS — H409 Unspecified glaucoma: Secondary | ICD-10-CM | POA: Diagnosis present

## 2018-09-19 DIAGNOSIS — Z7984 Long term (current) use of oral hypoglycemic drugs: Secondary | ICD-10-CM

## 2018-09-19 DIAGNOSIS — E559 Vitamin D deficiency, unspecified: Secondary | ICD-10-CM | POA: Diagnosis present

## 2018-09-19 DIAGNOSIS — Z560 Unemployment, unspecified: Secondary | ICD-10-CM

## 2018-09-19 DIAGNOSIS — Z20828 Contact with and (suspected) exposure to other viral communicable diseases: Secondary | ICD-10-CM | POA: Diagnosis present

## 2018-09-19 DIAGNOSIS — Z6834 Body mass index (BMI) 34.0-34.9, adult: Secondary | ICD-10-CM

## 2018-09-19 DIAGNOSIS — R112 Nausea with vomiting, unspecified: Secondary | ICD-10-CM

## 2018-09-19 HISTORY — DX: Unspecified atrial fibrillation: I48.91

## 2018-09-19 LAB — BASIC METABOLIC PANEL
Anion gap: 10 (ref 5–15)
BUN: 15 mg/dL (ref 6–20)
CO2: 24 mmol/L (ref 22–32)
Calcium: 8.8 mg/dL — ABNORMAL LOW (ref 8.9–10.3)
Chloride: 103 mmol/L (ref 98–111)
Creatinine, Ser: 0.66 mg/dL (ref 0.44–1.00)
GFR calc Af Amer: >60 mL/min (ref >60)
GFR calc non Af Amer: >60 mL/min (ref >60)
Glucose, Bld: 407 mg/dL — ABNORMAL HIGH (ref 70–99)
Potassium: 4 mmol/L (ref 3.5–5.1)
Sodium: 137 mmol/L (ref 135–145)

## 2018-09-19 LAB — CBC WITH DIFFERENTIAL/PLATELET
Abs Immature Granulocytes: 0.02 10*3/uL (ref 0.00–0.07)
Basophils Absolute: 0 10*3/uL (ref 0.0–0.1)
Basophils Relative: 1 %
Eosinophils Absolute: 0 10*3/uL (ref 0.0–0.5)
Eosinophils Relative: 0 %
HCT: 40.3 % (ref 36.0–46.0)
Hemoglobin: 12.4 g/dL (ref 12.0–15.0)
Immature Granulocytes: 0 %
Lymphocytes Relative: 13 %
Lymphs Abs: 0.9 10*3/uL (ref 0.7–4.0)
MCH: 26.7 pg (ref 26.0–34.0)
MCHC: 30.8 g/dL (ref 30.0–36.0)
MCV: 86.7 fL (ref 80.0–100.0)
Monocytes Absolute: 0.2 10*3/uL (ref 0.1–1.0)
Monocytes Relative: 3 %
Neutro Abs: 5.9 10*3/uL (ref 1.7–7.7)
Neutrophils Relative %: 83 %
Platelets: 159 10*3/uL (ref 150–400)
RBC: 4.65 MIL/uL (ref 3.87–5.11)
RDW: 12.6 % (ref 11.5–15.5)
WBC: 7 10*3/uL (ref 4.0–10.5)
nRBC: 0 % (ref 0.0–0.2)

## 2018-09-19 LAB — SARS CORONAVIRUS 2 BY RT PCR (HOSPITAL ORDER, PERFORMED IN ~~LOC~~ HOSPITAL LAB): SARS Coronavirus 2: NEGATIVE

## 2018-09-19 MED ORDER — DEXAMETHASONE 4 MG PO TABS
10.0000 mg | ORAL_TABLET | Freq: Once | ORAL | Status: AC
  Administered 2018-09-19: 10 mg via ORAL
  Filled 2018-09-19: qty 3

## 2018-09-19 MED ORDER — DIPHENHYDRAMINE HCL 50 MG/ML IJ SOLN
25.0000 mg | Freq: Once | INTRAMUSCULAR | Status: AC
  Administered 2018-09-19: 22:00:00 25 mg via INTRAVENOUS
  Filled 2018-09-19: qty 1

## 2018-09-19 MED ORDER — PROCHLORPERAZINE EDISYLATE 10 MG/2ML IJ SOLN
10.0000 mg | Freq: Once | INTRAMUSCULAR | Status: AC
  Administered 2018-09-19: 10 mg via INTRAVENOUS
  Filled 2018-09-19: qty 2

## 2018-09-19 MED ORDER — SODIUM CHLORIDE 0.9 % IV BOLUS
1000.0000 mL | Freq: Once | INTRAVENOUS | Status: AC
  Administered 2018-09-19: 22:00:00 1000 mL via INTRAVENOUS

## 2018-09-19 NOTE — ED Provider Notes (Signed)
[facility] EMERGENCY DEPARTMENT Provider Note   CSN: [id number] Arrival date & time: 09/19/18  2103    History   Chief Complaint Chief Complaint  Patient presents with   Atrial Fibrillation   Emesis   Headache    HPI Jennifer [name] is a 48 y.o. female.     48 yo F with a chief complaints of headache.  This started about 2:00.  Patient had multiple episodes of vomiting and was complaining of headache.  Has had some nausea with this but the vomiting has ceased.  Had a history of a similar presentation back in February.  Family thinks that this is somewhat similar.  The history is provided by the patient and a relative. The history is limited by a language barrier. A language interpreter was used.  Atrial Fibrillation  Associated symptoms include headaches. Pertinent negatives include no chest pain and no shortness of breath.  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   Annotation: secondary to uveitis of unknown etiology Qualifier: Diagnosis of  By: Hilma Favors  DO, Beth     Hair loss    History of fracture of clavicle 05/18/2015   Hyperlipidemia    Pap smear abnormality of cervix with LGSIL    Routine/ritual circumcision    Type II diabetes mellitus ([place])    Uveitis     Patient Active Problem List   Diagnosis Date Noted   Hematuria 08/24/2018   Pain, dental 08/19/2018   Vulvovaginitis 05/21/2018   Dependent edema 01/01/2018   Left anterior shoulder pain 07/06/2017   Hypertension associated with diabetes ([place]) 07/23/2016   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   Fatigue 04/02/2016   Uveitic glaucoma of left eye, 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 Authorizing Provider  acetaminophen (TYLENOL) 500 MG tablet Take 1,000 mg by mouth every 6 (six) hours as needed for pain.    [provider]  acetaZOLAMIDE (DIAMOX) 250 MG tablet Take 500 mg by mouth 2 (two) times daily.  04/21/18   [provider]  atorvastatin (LIPITOR) 40 MG tablet Take 1 tablet (40 mg total) by mouth daily. 11/05/17   Alphonzo Grieve, MD  Blood Glucose Monitoring Suppl ([place])  w/Device KIT 1 kit 11/05/17   Alphonzo Grieve, MD  brimonidine (ALPHAGAN) 0.2 % ophthalmic solution Place 1 drop into the left eye 3 (three) times daily.     [provider]  Continuous Blood Gluc Receiver (FREESTYLE LIBRE 14 DAY READER) DEVI Inject 1 Device into the skin See admin instructions. 11/20/17   Alphonzo Grieve, MD  Continuous Blood Gluc Sensor (FREESTYLE LIBRE 14 DAY SENSOR) MISC 1 each by Does not apply route 4 (four) times daily. 10/31/17   Alphonzo Grieve, MD  diclofenac sodium (VOLTAREN) 1 % GEL Apply 2 g topically 4 (four) times daily. Patient not taking: Reported on 06/12/2018 11/05/17   Alphonzo Grieve, MD  dorzolamide-timolol (COSOPT) 22.3-6.8 MG/ML ophthalmic solution Place 1 drop into the left eye 2 (two) times daily.    [provider]  DULoxetine  (CYMBALTA) 60 MG capsule Take 1 capsule (60 mg total) by mouth daily. 05/20/18 05/20/19  Alphonzo Grieve, MD  empagliflozin (JARDIANCE) 25 MG TABS tablet Take 25 mg by mouth daily.    [provider]  ferrous sulfate 325 (65 FE) MG tablet Take 1 tablet (325 mg total) by mouth daily. Patient not taking: Reported on 06/12/2018 04/29/17 04/29/18  Alphonzo Grieve, MD  folic acid (FOLVITE) 1 MG tablet Take 1 tablet (1 mg total) by mouth daily. Patient not taking: Reported on 06/12/2018 11/05/17   Alphonzo Grieve, MD  furosemide (LASIX) 20 MG tablet Take 1 tablet (20 mg total) by mouth daily as needed. Patient taking differently: Take 20 mg by mouth daily as needed for fluid.  05/20/18 05/20/19  Alphonzo Grieve, MD  glucose blood (ACCU-CHEK SMARTVIEW) test strip CHECK BLOOD SUGAR 4 TIMES DAILY BEFORE MEALS AND AT BEDTIME 05/20/18   Alphonzo Grieve, MD  ibuprofen (ADVIL,MOTRIN) 800 MG tablet Take 800 mg by mouth every 6 (six) hours as needed for mild pain.    [provider]  Insulin NPH, Human,, Isophane, (HUMULIN N) 100 UNIT/ML Kiwkpen INJECT 12 UNITS SUBCUTANEOUSLY EVERY MORNING AND 35 UNITS EVERY EVENING. Diag code E11.9 07/06/18   Alphonzo Grieve, MD  Insulin Pen Needle 31G X 5 MM MISC 1 Dose by Does not apply route 2 (two) times daily. 05/20/18   Alphonzo Grieve, MD  ketorolac (ACULAR) 0.5 % ophthalmic solution Place 1 drop into both eyes 4 (four) times daily.    [provider]  Lancets (ACCU-CHEK SOFT TOUCH) lancets Check Blood Sugar 4 times daily before meals and at bedtime. Dx code: E11.321 05/20/18   Alphonzo Grieve, MD  liraglutide (VICTOZA) 18 MG/3ML SOPN Inject 0.1 mLs (0.6 mg total) into the skin daily. After 1 week increase to 0.34m (1.260mtotal) into skin daily Patient taking differently: Inject 1.2 mg into the skin daily.  05/20/18   SvAlphonzo GrieveMD  lisinopril (PRINIVIL,ZESTRIL) 10 MG tablet Take 1 tablet (10 mg total) by mouth daily. 05/20/18   SvAlphonzo GrieveMD    metFORMIN (GLUCOPHAGE) 1000 MG tablet TAKE 1 TABLET (1,000 MG TOTAL) BY MOUTH 2 (TWO) TIMES DAILY WITH A MEAL. 06/11/18   SvAlphonzo GrieveMD  methotrexate (RHEUMATREX) 2.5 MG tablet Take 20 mg by mouth once a week. Caution:Chemotherapy. Protect from light.    [provider]  naproxen sodium (ALEVE) 220 MG tablet Take 220 mg by mouth daily as needed (pain).    [provider]  vitamin C (ASCORBIC ACID) 500 MG tablet Take 1 tablet (500 mg total) by mouth daily. 11/05/17   SvAlphonzo GrieveMD  Vitamin D, Cholecalciferol, 1000 units CAPS  Take 1,000 mg by mouth daily with breakfast. 11/05/17   Alphonzo Grieve, MD    Family History No family history on file.  Social History Social History   Tobacco Use   Smoking status: Never Smoker   Smokeless tobacco: Never Used  Substance Use Topics   Alcohol use: No    Alcohol/week: 0.0 standard drinks   Drug use: No     Allergies   Patient has no known allergies.   Review of Systems Review of Systems  Constitutional: Negative for chills and fever.  HENT: Negative for congestion and rhinorrhea.   Eyes: Negative for redness and visual disturbance.  Respiratory: Negative for shortness of breath and wheezing.   Cardiovascular: Negative for chest pain and palpitations.  Gastrointestinal: Positive for nausea and vomiting.  Genitourinary: Negative for dysuria and urgency.  Musculoskeletal: Negative for arthralgias and myalgias.  Skin: Negative for pallor and wound.  Neurological: Positive for headaches. Negative for dizziness.     Physical Exam Updated Vital Signs BP 110/80    Temp 98 F (36.7 C) (Oral)    Resp (!) 22   Physical Exam Vitals signs and nursing note reviewed.  Constitutional:      General: She is not in acute distress.    Appearance: She is well-developed. She is not diaphoretic.  HENT:     Head: Normocephalic and atraumatic.  Eyes:     Pupils: Pupils are equal, round, and reactive to light.  Neck:      Musculoskeletal: Normal range of motion and neck supple.  Cardiovascular:     Rate and Rhythm: Tachycardia present. Rhythm irregular.     Heart sounds: No murmur. No friction rub. No gallop.   Pulmonary:     Effort: Pulmonary effort is normal.     Breath sounds: No wheezing or rales.  Abdominal:     General: There is no distension.     Palpations: Abdomen is soft.     Tenderness: There is no abdominal tenderness.  Musculoskeletal:        General: No tenderness.  Skin:    General: Skin is warm and dry.  Neurological:     Mental Status: She is alert and oriented to person, place, and time.     Comments: No focal neuro deficit.  Ambulates  Psychiatric:        Behavior: Behavior normal.      ED Treatments / Results  Labs (all labs ordered are listed, but only abnormal results are displayed) Labs Reviewed  BASIC METABOLIC PANEL - Abnormal; Notable for the following components:      Result Value   Glucose, Bld 407 (*)    Calcium 8.8 (*)    All other components within normal limits  SARS CORONAVIRUS 2 (HOSPITAL ORDER, [place] [facility])  CBC WITH DIFFERENTIAL/PLATELET    EKG EKG Interpretation  Date/Time:  Saturday Sep 19 2018 21:20:17 EDT Ventricular Rate:  115 PR Interval:    QRS Duration: 89 QT Interval:  338 QTC Calculation: 468 R Axis:   21 Text Interpretation:  Atrial fibrillation Abnormal R-wave progression, early transition Otherwise no significant change Confirmed by Deno Etienne [phone]) on 09/19/2018 9:31:35 PM   Radiology No results found.  Procedures Procedures (including critical care time)  Medications Ordered in ED Medications  prochlorperazine (COMPAZINE) injection 10 mg (10 mg Intravenous Given 09/19/18 2158)  diphenhydrAMINE (BENADRYL) injection 25 mg (25 mg Intravenous Given 09/19/18 2158)  sodium chloride 0.9 % bolus 1,000 mL (1,000 mLs Intravenous  New Bag/Given 09/19/18 2157)  dexamethasone (DECADRON) tablet 10 mg (10 mg  Oral Given 09/19/18 2159)     Initial Impression / Assessment and Plan / ED Course  I have reviewed the triage vital signs and the nursing notes.  Pertinent labs & imaging results that were available during my care of the patient were reviewed by me and considered in my medical decision making (see chart for details).        48 yo F with a chief complaint of headache.  Feels like her similar when she had back in February.  Was thought to be at that time a migraine.  Patient also has a new onset atrial fibrillation.  No history of this previously.  We will give her a fluid bolus treat her headache check basic labs CT of the head.  Reassess.  Turned over to Dr. Kristopher Glee, please see his note for further details of care.   The patients results and plan were reviewed and discussed.   Any x-rays performed were independently reviewed by myself.   Differential diagnosis were considered with the presenting HPI.  Medications  prochlorperazine (COMPAZINE) injection 10 mg (10 mg Intravenous Given 09/19/18 2158)  diphenhydrAMINE (BENADRYL) injection 25 mg (25 mg Intravenous Given 09/19/18 2158)  sodium chloride 0.9 % bolus 1,000 mL (1,000 mLs Intravenous New Bag/Given 09/19/18 2157)  dexamethasone (DECADRON) tablet 10 mg (10 mg Oral Given 09/19/18 2159)    Vitals:   09/19/18 2127 09/19/18 2130 09/19/18 2145 09/19/18 2230  BP:  133/83 134/80 110/80  Resp:  (!) 30 (!) 25 (!) 22  Temp: 98 F (36.7 C)     TempSrc: Oral       Final diagnoses:  Bad headache  Nausea and vomiting in adult    Admission/ observation were discussed with the admitting physician, patient and/or family and they are comfortable with the plan.    Final Clinical Impressions(s) / ED Diagnoses   Final diagnoses:  Bad headache  Nausea and vomiting in adult    ED Discharge Orders    None       Deno Etienne, DO 09/19/18 2308

## 2018-09-19 NOTE — ED Notes (Signed)
Patient transported to CT 

## 2018-09-19 NOTE — ED Triage Notes (Signed)
Pt bib ems with Nausea, vomiting, near syncope, headache sudden onset 1600 today.  pale clammy on scene afib rvr (new onset) 130-160 Given 600cc NS en route and 10mg  metoprolol 80-120 on arrival  Other VSS with EMS.

## 2018-09-20 ENCOUNTER — Encounter (HOSPITAL_COMMUNITY)
Admission: EM | Disposition: A | Discharge status: Home / Self Care | Payer: Self-pay | Source: Home / Self Care | Attending: Internal Medicine

## 2018-09-20 ENCOUNTER — Encounter: Payer: Self-pay | Admitting: *Deleted

## 2018-09-20 ENCOUNTER — Encounter (HOSPITAL_COMMUNITY): Payer: Self-pay | Admitting: Anesthesiology

## 2018-09-20 ENCOUNTER — Encounter (HOSPITAL_COMMUNITY): Payer: Self-pay

## 2018-09-20 ENCOUNTER — Inpatient Hospital Stay (HOSPITAL_COMMUNITY): Payer: Medicaid Other

## 2018-09-20 DIAGNOSIS — E118 Type 2 diabetes mellitus with unspecified complications: Secondary | ICD-10-CM | POA: Diagnosis not present

## 2018-09-20 DIAGNOSIS — Z7984 Long term (current) use of oral hypoglycemic drugs: Secondary | ICD-10-CM | POA: Diagnosis not present

## 2018-09-20 DIAGNOSIS — Z961 Presence of intraocular lens: Secondary | ICD-10-CM | POA: Diagnosis present

## 2018-09-20 DIAGNOSIS — Z79899 Other long term (current) drug therapy: Secondary | ICD-10-CM | POA: Diagnosis not present

## 2018-09-20 DIAGNOSIS — E559 Vitamin D deficiency, unspecified: Secondary | ICD-10-CM | POA: Diagnosis present

## 2018-09-20 DIAGNOSIS — E1165 Type 2 diabetes mellitus with hyperglycemia: Secondary | ICD-10-CM

## 2018-09-20 DIAGNOSIS — I480 Paroxysmal atrial fibrillation: Secondary | ICD-10-CM | POA: Diagnosis present

## 2018-09-20 DIAGNOSIS — D509 Iron deficiency anemia, unspecified: Secondary | ICD-10-CM

## 2018-09-20 DIAGNOSIS — Z6834 Body mass index (BMI) 34.0-34.9, adult: Secondary | ICD-10-CM | POA: Diagnosis not present

## 2018-09-20 DIAGNOSIS — E1139 Type 2 diabetes mellitus with other diabetic ophthalmic complication: Secondary | ICD-10-CM | POA: Diagnosis not present

## 2018-09-20 DIAGNOSIS — Z833 Family history of diabetes mellitus: Secondary | ICD-10-CM | POA: Diagnosis not present

## 2018-09-20 DIAGNOSIS — H2512 Age-related nuclear cataract, left eye: Secondary | ICD-10-CM

## 2018-09-20 DIAGNOSIS — E669 Obesity, unspecified: Secondary | ICD-10-CM | POA: Diagnosis present

## 2018-09-20 DIAGNOSIS — Z560 Unemployment, unspecified: Secondary | ICD-10-CM | POA: Diagnosis not present

## 2018-09-20 DIAGNOSIS — I152 Hypertension secondary to endocrine disorders: Secondary | ICD-10-CM | POA: Diagnosis present

## 2018-09-20 DIAGNOSIS — I4891 Unspecified atrial fibrillation: Secondary | ICD-10-CM

## 2018-09-20 DIAGNOSIS — H5462 Unqualified visual loss, left eye, normal vision right eye: Secondary | ICD-10-CM | POA: Diagnosis present

## 2018-09-20 DIAGNOSIS — H42 Glaucoma in diseases classified elsewhere: Secondary | ICD-10-CM

## 2018-09-20 DIAGNOSIS — H409 Unspecified glaucoma: Secondary | ICD-10-CM | POA: Diagnosis present

## 2018-09-20 DIAGNOSIS — E785 Hyperlipidemia, unspecified: Secondary | ICD-10-CM

## 2018-09-20 DIAGNOSIS — I340 Nonrheumatic mitral (valve) insufficiency: Secondary | ICD-10-CM | POA: Diagnosis not present

## 2018-09-20 DIAGNOSIS — I10 Essential (primary) hypertension: Secondary | ICD-10-CM

## 2018-09-20 DIAGNOSIS — E1169 Type 2 diabetes mellitus with other specified complication: Secondary | ICD-10-CM | POA: Diagnosis present

## 2018-09-20 DIAGNOSIS — Z20828 Contact with and (suspected) exposure to other viral communicable diseases: Secondary | ICD-10-CM | POA: Diagnosis present

## 2018-09-20 DIAGNOSIS — I4892 Unspecified atrial flutter: Secondary | ICD-10-CM | POA: Diagnosis present

## 2018-09-20 DIAGNOSIS — G43909 Migraine, unspecified, not intractable, without status migrainosus: Secondary | ICD-10-CM

## 2018-09-20 DIAGNOSIS — Z821 Family history of blindness and visual loss: Secondary | ICD-10-CM | POA: Diagnosis not present

## 2018-09-20 DIAGNOSIS — Z794 Long term (current) use of insulin: Secondary | ICD-10-CM

## 2018-09-20 DIAGNOSIS — R51 Headache: Secondary | ICD-10-CM | POA: Diagnosis present

## 2018-09-20 LAB — GLUCOSE, CAPILLARY
Glucose-Capillary: 441 mg/dL — ABNORMAL HIGH (ref 70–99)
Glucose-Capillary: 392 mg/dL — ABNORMAL HIGH (ref 70–99)
Glucose-Capillary: 348 mg/dL — ABNORMAL HIGH (ref 70–99)
Glucose-Capillary: 363 mg/dL — ABNORMAL HIGH (ref 70–99)

## 2018-09-20 LAB — HEMOGLOBIN A1C
Hgb A1c MFr Bld: 15.2 % — ABNORMAL HIGH (ref 4.8–5.6)
Mean Plasma Glucose: 389.54 mg/dL

## 2018-09-20 LAB — ECHOCARDIOGRAM COMPLETE
Height: 66 in
Weight: 3209.9 oz

## 2018-09-20 LAB — HEPARIN LEVEL (UNFRACTIONATED)
Heparin Unfractionated: 0.42 IU/mL (ref 0.30–0.70)
Heparin Unfractionated: 0.32 IU/mL (ref 0.30–0.70)

## 2018-09-20 LAB — MRSA PCR SCREENING: MRSA by PCR: NEGATIVE

## 2018-09-20 LAB — HIV ANTIBODY (ROUTINE TESTING W REFLEX): HIV Screen 4th Generation wRfx: NONREACTIVE

## 2018-09-20 LAB — BRAIN NATRIURETIC PEPTIDE: B Natriuretic Peptide: 197.5 pg/mL — ABNORMAL HIGH (ref 0.0–100.0)

## 2018-09-20 LAB — TSH: TSH: 0.856 u[IU]/mL (ref 0.350–4.500)

## 2018-09-20 SURGERY — CARDIOVERSION
Anesthesia: Choice

## 2018-09-20 MED ORDER — LISINOPRIL 10 MG PO TABS
10.0000 mg | ORAL_TABLET | Freq: Every day | ORAL | Status: DC
  Administered 2018-09-20 – 2018-09-21 (×2): 10 mg via ORAL
  Filled 2018-09-20 (×2): qty 1

## 2018-09-20 MED ORDER — VITAMIN C 500 MG PO TABS
500.0000 mg | ORAL_TABLET | Freq: Every day | ORAL | Status: DC
  Administered 2018-09-20 – 2018-09-21 (×2): 500 mg via ORAL
  Filled 2018-09-20 (×2): qty 1

## 2018-09-20 MED ORDER — BRIMONIDINE TARTRATE 0.2 % OP SOLN
1.0000 [drp] | Freq: Three times a day (TID) | OPHTHALMIC | Status: DC
  Administered 2018-09-20 – 2018-09-21 (×4): 1 [drp] via OPHTHALMIC
  Filled 2018-09-20 (×2): qty 5

## 2018-09-20 MED ORDER — DULOXETINE HCL 60 MG PO CPEP
60.0000 mg | ORAL_CAPSULE | Freq: Every day | ORAL | Status: DC
  Administered 2018-09-20 – 2018-09-21 (×2): 60 mg via ORAL
  Filled 2018-09-20 (×2): qty 1

## 2018-09-20 MED ORDER — HEPARIN BOLUS VIA INFUSION
4000.0000 [IU] | Freq: Once | INTRAVENOUS | Status: AC
  Administered 2018-09-20: 4000 [IU] via INTRAVENOUS
  Filled 2018-09-20: qty 4000

## 2018-09-20 MED ORDER — INSULIN NPH (HUMAN) (ISOPHANE) 100 UNIT/ML ~~LOC~~ SUSP
35.0000 [IU] | Freq: Every day | SUBCUTANEOUS | Status: DC
  Administered 2018-09-21: 35 [IU] via SUBCUTANEOUS
  Filled 2018-09-20: qty 10

## 2018-09-20 MED ORDER — DIGOXIN 0.25 MG/ML IJ SOLN
0.2500 mg | Freq: Every day | INTRAMUSCULAR | Status: AC
  Administered 2018-09-20 – 2018-09-21 (×2): 0.25 mg via INTRAVENOUS
  Filled 2018-09-20 (×2): qty 2

## 2018-09-20 MED ORDER — ATORVASTATIN CALCIUM 40 MG PO TABS
40.0000 mg | ORAL_TABLET | Freq: Every day | ORAL | Status: DC
  Administered 2018-09-20 – 2018-09-21 (×2): 40 mg via ORAL
  Filled 2018-09-20 (×2): qty 1

## 2018-09-20 MED ORDER — ACETAMINOPHEN 650 MG RE SUPP: 650.0000 mg | Freq: Four times a day (QID) | RECTAL | Status: DC | PRN

## 2018-09-20 MED ORDER — INSULIN ASPART 100 UNIT/ML ~~LOC~~ SOLN
0.0000 [IU] | Freq: Three times a day (TID) | SUBCUTANEOUS | Status: DC
  Administered 2018-09-20 (×3): 15 [IU] via SUBCUTANEOUS
  Administered 2018-09-21: 3 [IU] via SUBCUTANEOUS

## 2018-09-20 MED ORDER — INSULIN NPH (HUMAN) (ISOPHANE) 100 UNIT/ML ~~LOC~~ SUSP
35.0000 [IU] | Freq: Every day | SUBCUTANEOUS | Status: DC
  Administered 2018-09-20: 35 [IU] via SUBCUTANEOUS
  Filled 2018-09-20: qty 10

## 2018-09-20 MED ORDER — KETOROLAC TROMETHAMINE 0.5 % OP SOLN
1.0000 [drp] | Freq: Four times a day (QID) | OPHTHALMIC | Status: DC
  Administered 2018-09-20 – 2018-09-21 (×5): 1 [drp] via OPHTHALMIC
  Filled 2018-09-20 (×2): qty 5

## 2018-09-20 MED ORDER — DILTIAZEM HCL-DEXTROSE 100-5 MG/100ML-% IV SOLN (PREMIX)
5.0000 mg/h | INTRAVENOUS | Status: DC
  Administered 2018-09-20: 5 mg/h via INTRAVENOUS
  Administered 2018-09-20: 10 mg/h via INTRAVENOUS
  Filled 2018-09-20 (×2): qty 100

## 2018-09-20 MED ORDER — VITAMIN D 25 MCG (1000 UNIT) PO TABS
1000.0000 [IU] | ORAL_TABLET | Freq: Every day | ORAL | Status: DC
  Administered 2018-09-20 – 2018-09-21 (×2): 1000 [IU] via ORAL
  Filled 2018-09-20 (×2): qty 1

## 2018-09-20 MED ORDER — ACETAMINOPHEN 325 MG PO TABS: 650.0000 mg | ORAL_TABLET | Freq: Four times a day (QID) | ORAL | Status: DC | PRN

## 2018-09-20 MED ORDER — INSULIN NPH (HUMAN) (ISOPHANE) 100 UNIT/ML ~~LOC~~ SUSP
35.0000 [IU] | Freq: Every day | SUBCUTANEOUS | Status: DC
  Administered 2018-09-20: 22:00:00 35 [IU] via SUBCUTANEOUS
  Filled 2018-09-20: qty 10

## 2018-09-20 MED ORDER — DILTIAZEM LOAD VIA INFUSION
10.0000 mg | Freq: Once | INTRAVENOUS | Status: AC
  Administered 2018-09-20: 10 mg via INTRAVENOUS
  Filled 2018-09-20: qty 10

## 2018-09-20 MED ORDER — SODIUM CHLORIDE 0.9% FLUSH
3.0000 mL | Freq: Two times a day (BID) | INTRAVENOUS | Status: DC
  Administered 2018-09-21: 3 mL via INTRAVENOUS

## 2018-09-20 MED ORDER — ACETAZOLAMIDE 250 MG PO TABS
500.0000 mg | ORAL_TABLET | Freq: Two times a day (BID) | ORAL | Status: DC
  Administered 2018-09-20 – 2018-09-21 (×3): 500 mg via ORAL
  Filled 2018-09-20 (×3): qty 2

## 2018-09-20 MED ORDER — INSULIN NPH (HUMAN) (ISOPHANE) 100 UNIT/ML ~~LOC~~ SUSP
25.0000 [IU] | Freq: Every day | SUBCUTANEOUS | Status: DC
  Filled 2018-09-20: qty 10

## 2018-09-20 MED ORDER — HEPARIN (PORCINE) 25000 UT/250ML-% IV SOLN
1200.0000 [IU]/h | INTRAVENOUS | Status: DC
  Administered 2018-09-20 (×2): 1200 [IU]/h via INTRAVENOUS
  Filled 2018-09-20 (×2): qty 250

## 2018-09-20 MED ORDER — DORZOLAMIDE HCL-TIMOLOL MAL 2-0.5 % OP SOLN
1.0000 [drp] | Freq: Two times a day (BID) | OPHTHALMIC | Status: DC
  Administered 2018-09-20 – 2018-09-21 (×3): 1 [drp] via OPHTHALMIC
  Filled 2018-09-20: qty 10

## 2018-09-20 NOTE — ED Notes (Signed)
Updated pt sister on plan of care

## 2018-09-20 NOTE — Discharge Summary (Signed)
Name: Jennifer [name] MRN: [id number] DOB: 24-Jul-1970 48 y.o. PCP: Alphonzo Grieve, MD  Date of Admission: 09/19/2018  9:03 PM Date of Discharge:  09/21/2018 Attending Physician: Oda Kilts, MD  Discharge Diagnosis: 1.  Paroxysmal atrial fibrillation 2.  Migraine headache 3.  Uncontrolled type 2 diabetes mellitus 4.  Hypertension  Discharge Medications: Allergies as of 09/21/2018   No Known Allergies     Medication List    TAKE these medications   Accu-Chek Nano SmartView w/Device Kit 1 kit   accu-chek soft touch lancets Check Blood Sugar 4 times daily before meals and at bedtime. Dx code: E11.321   acetaminophen 500 MG tablet Commonly known as:  TYLENOL Take 1,000 mg by mouth every 6 (six) hours as needed for pain.   acetaZOLAMIDE 250 MG tablet Commonly known as:  DIAMOX Take 500 mg by mouth 2 (two) times daily.   apixaban 5 MG Tabs tablet Commonly known as:  ELIQUIS Take 1 tablet (5 mg total) by mouth 2 (two) times daily.   atorvastatin 40 MG tablet Commonly known as:  Lipitor Take 1 tablet (40 mg total) by mouth daily.   brimonidine 0.2 % ophthalmic solution Commonly known as:  ALPHAGAN Place 1 drop into the left eye 3 (three) times daily.   diclofenac sodium 1 % Gel Commonly known as:  VOLTAREN Apply 2 g topically 4 (four) times daily.   diltiazem 60 MG 12 hr capsule Commonly known as:  CARDIZEM SR Take 1 capsule (60 mg total) by mouth 2 (two) times daily.   dorzolamide-timolol 22.3-6.8 MG/ML ophthalmic solution Commonly known as:  COSOPT Place 1 drop into the left eye 2 (two) times daily.   DULoxetine 60 MG capsule Commonly known as:  Cymbalta Take 1 capsule (60 mg total) by mouth daily.   ferrous sulfate 325 (65 FE) MG tablet Take 1 tablet (325 mg total) by mouth daily.   folic acid 1 MG tablet Commonly known as:  FOLVITE Take 1 tablet (1 mg total) by mouth daily.   FreeStyle Libre 14 Day Reader Kerrin Mo Inject 1 Device into the skin See  admin instructions.   FreeStyle Libre 14 Day Sensor Misc 1 each by Does not apply route 4 (four) times daily.   furosemide 20 MG tablet Commonly known as:  Lasix Take 1 tablet (20 mg total) by mouth daily as needed. What changed:  reasons to take this   glucose blood test strip Commonly known as:  Accu-Chek SmartView CHECK BLOOD SUGAR 4 TIMES DAILY BEFORE MEALS AND AT BEDTIME   HYDROcodone-acetaminophen 5-325 MG tablet Commonly known as:  NORCO/VICODIN Take 1 tablet by mouth every 4 (four) hours as needed (breakthrough pain).   ibuprofen 800 MG tablet Commonly known as:  ADVIL Take 800 mg by mouth 4 (four) times daily as needed (pain).   Insulin NPH (Human) (Isophane) 100 UNIT/ML Kiwkpen Commonly known as:  HUMULIN N INJECT 16 UNITS SUBCUTANEOUSLY EVERY MORNING AND 35 UNITS EVERY EVENING. Diag code E11.9   Insulin Pen Needle 31G X 5 MM Misc 1 Dose by Does not apply route 2 (two) times daily.   Jardiance 25 MG Tabs tablet Generic drug:  empagliflozin Take 25 mg by mouth daily.   ketorolac 0.5 % ophthalmic solution Commonly known as:  ACULAR Place 1 drop into both eyes 4 (four) times daily.   liraglutide 18 MG/3ML Sopn Commonly known as:  VICTOZA Inject 0.1 mLs (0.6 mg total) into the skin daily. After 1 week increase to 0.54m (1.270m  total) into skin daily What changed:    how much to take  additional instructions   lisinopril 10 MG tablet Commonly known as:  ZESTRIL Take 1 tablet (10 mg total) by mouth daily.   metFORMIN 1000 MG tablet Commonly known as:  GLUCOPHAGE TAKE 1 TABLET (1,000 MG TOTAL) BY MOUTH 2 (TWO) TIMES DAILY WITH A MEAL.   methotrexate 2.5 MG tablet Commonly known as:  RHEUMATREX Take 20 mg by mouth once a week. Caution:Chemotherapy. Protect from light.   naproxen sodium 220 MG tablet Commonly known as:  ALEVE Take 220 mg by mouth daily as needed (pain).   vitamin C 500 MG tablet Commonly known as:  ASCORBIC ACID Take 1 tablet (500 mg  total) by mouth daily.   Vitamin D (Cholecalciferol) 25 MCG (1000 UT) Caps Take 1,000 mg by mouth daily with breakfast.       Disposition and follow-up:   [name] was discharged from [facility] [place] condition.  At the hospital follow up visit please address:  1.  Paroxysmal atrial fibrillation: NEWLY diagnosed. Ensure compliance with Eliquis, diltiazem and follow-up with cardiology.      Uncontrolled type 2 diabetes mellitus: A1c greater than 15. Requires diabetic teaching, diet control.   2.  Labs / imaging needed at time of follow-up:  None  3.  Pending labs/ test needing follow-up: None   Follow-up Appointments: Follow-up Information    Dixie Dials, MD. Schedule an appointment as soon as possible for a visit in 1 week(s).   Specialty:  Cardiology Contact information: [place] [address] [phone]           Hospital Course by problem list:  Paroxysmal atrial fibrillation: [name] is a 48 year old Arabic speaking woman with poorly controlled type 2 diabetes mellitus, hyperlipidemia, hypertension, iron deficiency anemia, vitamin D deficiency, glaucoma of the left eye who presented to the emergency department on Sep 20, 2018 with headache, emesis and palpitation.  Her headache and emesis were secondary to migraine.  Symptoms resolved after she received a migraine cocktail.  In the ED she was noted to be tachycardic in the 120s-140s.  EKG showed atrial fibrillation with RVR.  She was started on IV diltiazem for rate control.  She had soft blood pressures and was given a dose of IV digoxin.  She converted back into sinus rhythm after this interventions.  She was transitioned to oral diltiazem and was started on an anticoagulation agent with Eliquis 5 mg twice daily.  She is to follow-up with cardiologist in 1 week and the internal medicine clinic.  The etiology of her new onset atrial fibrillation was thought to be  secondary to structural heart disease (HFpEF). TTE showed LVEF 50 to 55%, mild to moderate mitral regurgitation and mild to moderate atrial dilation, worsened from her prior TTE in 2017.   Discharge Vitals:   BP 127/72 (BP Location: Right Arm)   Pulse 68   Temp 98 F (36.7 C) (Oral)   Resp 17   Ht _0  (1.676 m)   Wt 91 kg   SpO2 100%   BMI 32.38 kg/m   Pertinent Labs, Studies, and Procedures:  Transthoracic echocardiogram. IMPRESSIONS    1. The left ventricle has low normal systolic function, with an ejection fraction of 50-55%. The cavity size was normal. There is mild concentric left ventricular hypertrophy. Left ventricular diastolic Doppler parameters are consistent with impaired  relaxation.  2. The right ventricle has normal systolic  function. The cavity was normal. There is no increase in right ventricular wall thickness.  3. Left atrial size was mild-moderately dilated.  4. Right atrial size was mildly dilated.  5. Mitral valve regurgitation is mild to moderate by color flow Doppler.  6. The aortic valve is tricuspid.  7. The aortic root and ascending aorta are normal in size and structure.  8. The inferior vena cava was dilated in size with <50% respiratory variability.  Discharge Instructions: Discharge Instructions    Call MD for:  persistant dizziness or light-headedness   Complete by:  As directed    Diet - low sodium heart healthy   Complete by:  As directed    Discharge instructions   Complete by:  As directed    [name],  It was a pleasure taking care of you at the hospital.  You were admitted because of severe headaches which was due to migraine headaches.  I am glad your headaches got better.  In addition, you were found to have an irregular heart beat with a fast heart rate.  This condition is called atrial fibrillation.  Your irregular heartbeat improved with medication.  Your my recommendation after discharge: 1.  I am starting you on 2  medications.  One is called diltiazem, you should take 1 pill twice a day.  The other medication is called Eliquis, you will take 1 pill twice a day 2.  You have a follow-up with the cardiologist in 1 week on Monday, June 8 at 3 PM.  Take care. Dr. Eileen Stanford   Increase activity slowly   Complete by:  As directed       Signed: Jean Rosenthal, MD 09/21/2018, 12:31 PM   Pager: [phone] IMTS PGY-1

## 2018-09-20 NOTE — ED Notes (Signed)
Please call patients son to give him an update on his mother.  [phone]

## 2018-09-20 NOTE — ED Notes (Signed)
Spoke with pt son to give update

## 2018-09-20 NOTE — Progress Notes (Addendum)
Pt converted to NSR at 3.30pm from A-fib, Confirmed with 12 lead EKG. Dr . Doylene Canard informed, pt is still in NPO if in case for cardioversion, will continue to monitor  Palma Holter, RN

## 2018-09-20 NOTE — Progress Notes (Signed)
ANTICOAGULATION CONSULT NOTE  Pharmacy Consult for heparin Indication: atrial fibrillation  No Known Allergies  Patient Measurements: Height: 5\' 6"  (167.6 cm) Weight: 200 lb 9.9 oz (91 kg) IBW/kg (Calculated) : 59.3 Heparin Dosing Weight: 80kg  Vital Signs: Temp: 97.9 F (36.6 C) (05/31 1039) Temp Source: Oral (05/31 1039) BP: 114/72 (05/31 1039) Pulse Rate: 79 (05/31 1039)  Labs: Recent Labs    09/19/18 2130 09/20/18 0954  HGB 12.4  --   HCT 40.3  --   PLT 159  --   HEPARINUNFRC  --  0.42  CREATININE 0.66  --     Estimated Creatinine Clearance: 98.8 mL/min (by C-G formula based on SCr of 0.66 mg/dL).   Medical History: Past Medical History:  Diagnosis Date  . Anemia, iron deficiency   . Atrial fibrillation ([place])   . Blindness of left eye   . Decreased visual acuity    Left eye  . 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 Hair loss   . History of fracture of clavicle 05/18/2015  . Hyperlipidemia   . Pap smear abnormality of cervix with LGSIL   . Routine/ritual circumcision   . Type II diabetes mellitus ([place])   . Uveitis     Assessment: 48yo female c/o N/V, near syncope, and HA w/ sudden onset >> found to be in Afib w/ RVR, to begin heparin.  Initial heparin level therapeutic at 0.42 on heparin 1200 units/hr. CBC WNL. No signs/symptoms of bleeding or issues with infusion reported by nurse.   Goal of Therapy:  Heparin level 0.3-0.7 units/ml Monitor platelets by anticoagulation protocol: Yes   Plan:  Continue heparin infusion at 1200 units/hr Check confirmatory anti-Xa level in 6 hours and daily while on heparin Continue to monitor H&H and platelets   Claiborne Billings, PharmD PGY2 Cardiology Pharmacy Resident Please check AMION for all Pharmacist numbers by unit 09/20/2018 10:45 AM

## 2018-09-20 NOTE — Consult Note (Signed)
Referring Physician: L Harbrecht/Internal medicine  Jennifer [name] is an 48 y.o. female.                       Chief Complaint: New onset atrial fibrillation  HPI: 48 year old Arabic speaking female with type 2 DM, hypertension, hyperlipidemia, vitamin D deficiency and left eye glaucoma has headache, emesis and palpitation. She was found toi be in atrial fibrillation with RVR. She responded to IV diltiazem with heart rate in 90-110 range and post IV digoxin one dose her heart rate is 80-100 bpm. She has no prior h/o atrial fibrillation. Her echocardiogram showed mild LVH and normal LV systolic function with mild to moderate MR and mild TR.  Her TSH is normal but her Hgb A1C is very high at 15.2 %. BNP was mildly elevated at 197.5 pg.  Past Medical History:  Diagnosis Date  . Anemia, iron deficiency   . Atrial fibrillation ([place])   . Blindness of left eye   . Decreased visual acuity    Left eye  . 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 Hair loss   . History of fracture of clavicle 05/18/2015  . Hyperlipidemia   . Pap smear abnormality of cervix with LGSIL   . Routine/ritual circumcision   . Type II diabetes mellitus ([place])   . Uveitis       Past Surgical History:  Procedure Laterality Date  . CESAREAN SECTION      No family history on file. Social History:  reports that she has never smoked. She has never used smokeless tobacco. She reports that she does not drink alcohol or use drugs.  Allergies: No Known Allergies  Medications Prior to Admission  Medication Sig Dispense Refill  . acetaminophen (TYLENOL) 500 MG tablet Take 1,000 mg by mouth every 6 (six) hours as needed for pain.    Marland Kitchen acetaZOLAMIDE (DIAMOX) 250 MG tablet Take 500 mg by mouth 2 (two) times daily.     Marland Kitchen atorvastatin (LIPITOR) 40 MG tablet Take 1 tablet (40 mg total) by mouth daily. 90  tablet 3  . Blood Glucose Monitoring Suppl (ACCU-CHEK NANO SMARTVIEW) w/Device KIT 1 kit 1 kit 0  . brimonidine (ALPHAGAN) 0.2 % ophthalmic solution Place 1 drop into the left eye 3 (three) times daily.     . Continuous Blood Gluc Receiver (FREESTYLE LIBRE 14 DAY READER) DEVI Inject 1 Device into the skin See admin instructions. 1 Device 0  . Continuous Blood Gluc Sensor (FREESTYLE LIBRE 14 DAY SENSOR) MISC 1 each by Does not apply route 4 (four) times daily. 2 each 12  . diclofenac sodium (VOLTAREN) 1 % GEL Apply 2 g topically 4 (four) times daily. (Patient not taking: Reported on 06/12/2018) 100 g 0  . dorzolamide-timolol (COSOPT) 22.3-6.8 MG/ML ophthalmic solution Place 1 drop into the left eye 2 (two) times daily.    . DULoxetine (CYMBALTA) 60 MG capsule Take 1 capsule (60 mg total) by mouth daily. 90 capsule 1  . empagliflozin (JARDIANCE) 25 MG TABS tablet Take 25 mg by mouth daily.    . ferrous sulfate 325 (65 FE) MG tablet Take 1 tablet (325 mg total) by mouth daily. (Patient not taking: Reported on 06/12/2018) 30 tablet 11  . folic acid (FOLVITE) 1 MG tablet Take 1 tablet (1 mg total) by mouth daily. (Patient not taking: Reported  on 06/12/2018) 90 tablet 1  . furosemide (LASIX) 20 MG tablet Take 1 tablet (20 mg total) by mouth daily as needed. (Patient taking differently: Take 20 mg by mouth daily as needed for fluid. ) 30 tablet 3  . glucose blood (ACCU-CHEK SMARTVIEW) test strip CHECK BLOOD SUGAR 4 TIMES DAILY BEFORE MEALS AND AT BEDTIME 300 each 5  . ibuprofen (ADVIL,MOTRIN) 800 MG tablet Take 800 mg by mouth every 6 (six) hours as needed for mild pain.    . Insulin NPH, Human,, Isophane, (HUMULIN N) 100 UNIT/ML Kiwkpen INJECT 45 UNITS SUBCUTANEOUSLY EVERY MORNING AND 35 UNITS EVERY EVENING. Diag code E11.9 45 mL 3  . Insulin Pen Needle 31G X 5 MM MISC 1 Dose by Does not apply route 2 (two) times daily. 100 each 11  . ketorolac (ACULAR) 0.5 % ophthalmic solution Place 1 drop into both eyes 4  (four) times daily.    . Lancets (ACCU-CHEK SOFT TOUCH) lancets Check Blood Sugar 4 times daily before meals and at bedtime. Dx code: E11.321 100 each 11  . liraglutide (VICTOZA) 18 MG/3ML SOPN Inject 0.1 mLs (0.6 mg total) into the skin daily. After 1 week increase to 0.24m (1.270mtotal) into skin daily (Patient taking differently: Inject 1.2 mg into the skin daily. ) 6 mL 2  . lisinopril (PRINIVIL,ZESTRIL) 10 MG tablet Take 1 tablet (10 mg total) by mouth daily. 30 tablet 3  . metFORMIN (GLUCOPHAGE) 1000 MG tablet TAKE 1 TABLET (1,000 MG TOTAL) BY MOUTH 2 (TWO) TIMES DAILY WITH A MEAL. 180 tablet 1  . methotrexate (RHEUMATREX) 2.5 MG tablet Take 20 mg by mouth once a week. Caution:Chemotherapy. Protect from light.    . naproxen sodium (ALEVE) 220 MG tablet Take 220 mg by mouth daily as needed (pain).    . vitamin C (ASCORBIC ACID) 500 MG tablet Take 1 tablet (500 mg total) by mouth daily. 90 tablet 3  . Vitamin D, Cholecalciferol, 1000 units CAPS Take 1,000 mg by mouth daily with breakfast. 60 capsule 5    Results for orders placed or performed during the hospital encounter of 09/19/18 (from the past 48 hour(s))  CBC with Differential     Status: None   Collection Time: 09/19/18  9:30 PM  Result Value Ref Range   WBC 7.0 4.0 - 10.5 K/uL   RBC 4.65 3.87 - 5.11 MIL/uL   Hemoglobin 12.4 12.0 - 15.0 g/dL   HCT 40.3 36.0 - 46.0 %   MCV 86.7 80.0 - 100.0 fL   MCH 26.7 26.0 - 34.0 pg   MCHC 30.8 30.0 - 36.0 g/dL   RDW 12.6 11.5 - 15.5 %   Platelets 159 150 - 400 K/uL   nRBC 0.0 0.0 - 0.2 %   Neutrophils Relative % 83 %   Neutro Abs 5.9 1.7 - 7.7 K/uL   Lymphocytes Relative 13 %   Lymphs Abs 0.9 0.7 - 4.0 K/uL   Monocytes Relative 3 %   Monocytes Absolute 0.2 0.1 - 1.0 K/uL   Eosinophils Relative 0 %   Eosinophils Absolute 0.0 0.0 - 0.5 K/uL   Basophils Relative 1 %   Basophils Absolute 0.0 0.0 - 0.1 K/uL   Immature Granulocytes 0 %   Abs Immature Granulocytes 0.02 0.00 - 0.07 K/uL     Comment: Performed at [facility] [place] [id number] metabolic panel     Status: Abnormal   Collection Time: 09/19/18  9:30 PM  Result Value Ref Range   Sodium 137 135 - 145 mmol/L   Potassium 4.0 3.5 - 5.1 mmol/L   Chloride 103 98 - 111 mmol/L   CO2 24 22 - 32 mmol/L   Glucose, Bld 407 (H) 70 - 99 mg/dL   BUN 15 6 - 20 mg/dL   Creatinine, Ser 0.66 0.44 - 1.00 mg/dL   Calcium 8.8 (L) 8.9 - 10.3 mg/dL   GFR calc non Af Amer >60 >60 mL/min   GFR calc Af Amer >60 >60 mL/min   Anion gap 10 5 - 15    Comment: Performed at [facility] [address] [place], [place] [address]  SARS Coronavirus 2 (CEPHEID - Performed in [facility] lab), Hosp Order     Status: None   Collection Time: 09/19/18 10:05 PM  Result Value Ref Range   SARS Coronavirus 2 NEGATIVE NEGATIVE    Comment: (NOTE) If result is NEGATIVE SARS-CoV-2 target nucleic acids are NOT DETECTED. The SARS-CoV-2 RNA is generally detectable in upper and lower  respiratory specimens during the acute phase of infection. The lowest  concentration of SARS-CoV-2 viral copies this assay can detect is 250  copies / mL. A negative result does not preclude SARS-CoV-2 infection  and should not be used as the sole basis for treatment or other  patient management decisions.  A negative result may occur with  improper specimen collection / handling, submission of specimen other  than nasopharyngeal swab, presence of viral mutation(s) within the  areas targeted by this assay, and inadequate number of viral copies  (<250 copies / mL). A negative result must be combined with clinical  observations, patient history, and epidemiological information. If result is POSITIVE SARS-CoV-2 target nucleic acids are DETECTED. The SARS-CoV-2 RNA is generally detectable in upper and lower  respiratory specimens dur ing the acute phase of infection.  Positive  results are indicative of active infection  with SARS-CoV-2.  Clinical  correlation with patient history and other diagnostic information is  necessary to determine patient infection status.  Positive results do  not rule out bacterial infection or co-infection with other viruses. If result is PRESUMPTIVE POSTIVE SARS-CoV-2 nucleic acids MAY BE PRESENT.   A presumptive positive result was obtained on the submitted specimen  and confirmed on repeat testing.  While 2019 novel coronavirus  (SARS-CoV-2) nucleic acids may be present in the submitted sample  additional confirmatory testing may be necessary for epidemiological  and / or clinical management purposes  to differentiate between  SARS-CoV-2 and other Sarbecovirus currently known to infect humans.  If clinically indicated additional testing with an alternate test  methodology [phone]) is advised. The SARS-CoV-2 RNA is generally  detectable in upper and lower respiratory sp ecimens during the acute  phase of infection. The expected result is Negative. Fact Sheet for Patients:  StrictlyIdeas.no Fact Sheet for Healthcare Providers: BankingDealers.co.za This test is not yet approved or cleared by the [place] FDA and has been authorized for detection and/or diagnosis of SARS-CoV-2 by FDA under an Emergency Use Authorization (EUA).  This EUA will remain in effect (meaning this test can be used) for the duration of the COVID-19 declaration under Section 564(b)(1) of the Act, 21 U.S.C. section 360bbb-3(b)(1), unless the authorization is terminated or revoked sooner. Performed at [facility], [place] [address] [place],  [address]   TSH     Status: None   Collection Time: 09/20/18  5:08 AM  Result Value Ref  Range   TSH 0.856 0.350 - 4.500 uIU/mL    Comment: Performed by a 3rd Generation assay with a functional sensitivity of <=0.01 uIU/mL. Performed at [facility], [place] [address], [place], [place] [address]    Hemoglobin A1c     Status: Abnormal   Collection Time: 09/20/18  5:34 AM  Result Value Ref Range   Hgb A1c MFr Bld 15.2 (H) 4.8 - 5.6 %    Comment: (NOTE) Pre diabetes:          5.7%-6.4% Diabetes:              >6.4% Glycemic control for   <7.0% adults with diabetes    Mean Plasma Glucose 389.54 mg/dL    Comment: Performed at [facility] [address], [place], [place] [address]  Brain natriuretic peptide     Status: Abnormal   Collection Time: 09/20/18  5:34 AM  Result Value Ref Range   B Natriuretic Peptide 197.5 (H) 0.0 - 100.0 pg/mL    Comment: Performed at [facility] [address] [place], [place] [address]  Glucose, capillary     Status: Abnormal   Collection Time: 09/20/18  6:32 AM  Result Value Ref Range   Glucose-Capillary 441 (H) 70 - 99 mg/dL  MRSA PCR Screening     Status: None   Collection Time: 09/20/18  7:08 AM  Result Value Ref Range   MRSA by PCR NEGATIVE NEGATIVE    Comment:        The GeneXpert MRSA Assay (FDA approved for NASAL specimens only), is one component of a comprehensive MRSA colonization surveillance program. It is not intended to diagnose MRSA infection nor to guide or monitor treatment for MRSA infections. Performed at [facility], [place] [address] [place], [place] [address]   Heparin level (unfractionated)     Status: None   Collection Time: 09/20/18  9:54 AM  Result Value Ref Range   Heparin Unfractionated 0.42 0.30 - 0.70 IU/mL    Comment: (NOTE) If heparin results are below expected values, and patient dosage has  been confirmed, suggest follow up testing of antithrombin III levels. Performed at [facility], [place] [address], [name], [place] [address]   Glucose, capillary     Status: Abnormal   Collection Time: 09/20/18 10:59 AM  Result Value Ref Range   Glucose-Capillary 392 (H) 70 - 99 mg/dL   Comment 1 Notify RN    Comment 2 Document in Chart    Ct Head Wo Contrast  Result Date:  09/19/2018 CLINICAL DATA:  Initial evaluation for acute severe headache. EXAM: CT HEAD WITHOUT CONTRAST TECHNIQUE: Contiguous axial images were obtained from the base of the skull through the vertex without intravenous contrast. COMPARISON:  Prior CT from 06/12/2018 FINDINGS: Brain: Generalized age-related cerebral atrophy with mild chronic small vessel ischemic disease. No evidence for acute intracranial hemorrhage. No findings to suggest acute large vessel territory infarct. No mass lesion, midline shift, or mass effect. Ventricles are normal in size without evidence for hydrocephalus. No extra-axial fluid collection identified. Vascular: No hyperdense vessel identified.Scattered vascular calcifications noted within the carotid siphons. Skull: Scattered foci of soft tissue emphysema within the left frontotemporal region, likely related IV access. Scalp soft tissues demonstrate no other acute finding. Calvarium intact. Sinuses/Orbits: Globes and orbital soft tissues within normal limits. Visualized paranasal sinuses are clear. No mastoid effusion. IMPRESSION: 1. No acute intracranial process identified. 2. Mild age-related cerebral atrophy with  chronic small vessel ischemic disease. Electronically Signed   By: Jeannine Boga M.D.   On: 09/19/2018 23:09    Review Of Systems Constitutional: No fever, chills, positive chronic weight gain. Eyes: No vision change, wears glasses. No discharge or pain. Ears: No hearing loss, No tinnitus. Respiratory: No asthma, COPD, pneumonias. Positive shortness of breath. No hemoptysis. Cardiovascular: No chest pain, positive palpitation, leg edema. Gastrointestinal: No nausea, vomiting, diarrhea, constipation. No GI bleed. No hepatitis. Genitourinary: No dysuria, hematuria, kidney stone. No incontinance. Neurological: Positive headache, no stroke, seizures.  Psychiatry: No psych facility admission for anxiety, depression, suicide. No detox. Skin: No  rash. Musculoskeletal: No joint pain, fibromyalgia. No neck pain, back pain. Lymphadenopathy: No lymphadenopathy. Hematology: No anemia or easy bruising.   Blood pressure 99/71, pulse (!) 101, temperature 98 F (36.7 C), temperature source Oral, resp. rate (!) 27, height '5\' 6"'  (1.676 m), weight 91 kg, SpO2 98 %. Body mass index is 32.38 kg/m. General appearance: alert, cooperative, appears stated age and no distress Head: Normocephalic, atraumatic. Eyes: Brown eyes, pink conjunctiva, corneas clear. PERRL, EOM's intact. Neck: No adenopathy, no carotid bruit, no JVD, supple, symmetrical, trachea midline and thyroid not enlarged. Resp: Clear to auscultation bilaterally. Cardio: Irregular rate and rhythm, S1, S2 normal, II/VI systolic murmur, no click, rub or gallop GI: Soft, non-tender; bowel sounds normal; no organomegaly. Extremities: Trace edema, no cyanosis or clubbing. Skin: Warm and dry.  Neurologic: Alert and oriented X 3, normal strength. Normal coordination and gait.  Assessment/Plan Atrial fibrillation, CHA2DS2VASc score of 3, New onset Hyperlipidemia Obesity Type 2 DM, uncontrolled  Agree with diltiazem drip and heparin IV digoxin as blood pressure is soft to add Beta-blocker or amiodarone Consider external cardioversion for new onset atrial fibrillation.. Patient agrees to the procedure and understood risks.  Birdie Riddle, MD  09/20/2018, 2:52 PM

## 2018-09-20 NOTE — Plan of Care (Signed)

## 2018-09-20 NOTE — Progress Notes (Signed)
[facility] for heparin Indication: atrial fibrillation  No Known Allergies  Patient Measurements: Height: 5\' 6"  (167.6 cm) Weight: 200 lb 9.9 oz (91 kg) IBW/kg (Calculated) : 59.3 Heparin Dosing Weight: 80kg  Vital Signs: Temp: 98 F (36.7 C) (05/31 1600) Temp Source: Oral (05/31 1600) BP: 124/59 (05/31 1600) Pulse Rate: 58 (05/31 1600)  Labs: Recent Labs    09/19/18 2130 09/20/18 0954 09/20/18 1608  HGB 12.4  --   --   HCT 40.3  --   --   PLT 159  --   --   HEPARINUNFRC  --  0.42 0.32  CREATININE 0.66  --   --     Estimated Creatinine Clearance: 98.8 mL/min (by C-G formula based on SCr of 0.66 mg/dL).  Assessment: 48yo female c/o N/V, near syncope, and HA w/ sudden onset >> found to be in Afib w/ RVR, started on heparin. Heparin level remains therapeutic at 0.32. No bleeding noted.    Goal of Therapy:  Heparin level 0.3-0.7 units/ml Monitor platelets by anticoagulation protocol: Yes   Plan:  Continue heparin infusion at 1200 units/hr Daily heparin level and CBC  Salome Arnt, PharmD, BCPS Please see AMION for all pharmacy numbers 09/20/2018 4:49 PM

## 2018-09-20 NOTE — ED Provider Notes (Signed)
Patient signed out to me by Dr. Tyrone Nine to monitor after receiving meds for headache.  Physical Exam  BP 115/75   Temp 98 F (36.7 C) (Oral)   Resp 15   Ht 5\' 6"  (1.676 m)   Wt 98.1 kg   BMI 34.91 kg/m   Physical Exam Constitutional:      Appearance: She is well-developed.  HENT:     Head: Atraumatic.  Cardiovascular:     Rate and Rhythm: Tachycardia present. Rhythm irregular.     Heart sounds: Normal heart sounds.  Pulmonary:     Effort: Pulmonary effort is normal.     Breath sounds: Normal breath sounds.  Musculoskeletal: Normal range of motion.  Neurological:     Mental Status: She is alert.     GCS: GCS eye subscore is 4. GCS verbal subscore is 5. GCS motor subscore is 6.     Cranial Nerves: No cranial nerve deficit.     Sensory: No sensory deficit.     ED Course/Procedures     .Critical Care Performed by: Orpah Greek, MD Authorized by: Orpah Greek, MD   Critical care provider statement:    Critical care time (minutes):  30   Critical care was time spent personally by me on the following activities:  Discussions with consultants, evaluation of patient's response to treatment, examination of patient, ordering and performing treatments and interventions, ordering and review of laboratory studies, ordering and review of radiographic studies, pulse oximetry, re-evaluation of patient's condition, obtaining history from patient or surrogate and review of old charts   I assumed direction of critical care for this patient from another provider in my specialty: yes      MDM  Patient doing well after migraine cocktail.  CT head unremarkable.  Blood work unremarkable.  Does not require any further work-up for her recurrent headache.  Patient is, however, found to be in atrial fibrillation with a rapid ventricular response.  She does not have a history of this.  She was unaware of the irregular rhythm, time of initiation of the rhythm is unclear.  She is  not a candidate for cardioversion.  Patient initiated on heparin and Cardizem, will be admitted for further management.         Orpah Greek, MD 09/20/18 [phone]

## 2018-09-20 NOTE — Progress Notes (Signed)
Cardizem stopped, recent HR 64,  NSR  MD paged, Heparin contd.  Palma Holter, RN

## 2018-09-20 NOTE — Progress Notes (Signed)
ANTICOAGULATION CONSULT NOTE - Initial Consult  Pharmacy Consult for heparin Indication: atrial fibrillation  No Known Allergies  Patient Measurements: Height: 5\' 6"  (167.6 cm) Weight: 216 lb 4.3 oz (98.1 kg) IBW/kg (Calculated) : 59.3 Heparin Dosing Weight: 80kg  Vital Signs: Temp: 98 F (36.7 C) (05/30 2127) Temp Source: Oral (05/30 2127) BP: 115/75 (05/31 0100)  Labs: Recent Labs    09/19/18 2130  HGB 12.4  HCT 40.3  PLT 159  CREATININE 0.66    Estimated Creatinine Clearance: 102.7 mL/min (by C-G formula based on SCr of 0.66 mg/dL).   Medical History: Past Medical History:  Diagnosis Date  . Anemia, iron deficiency   . Atrial fibrillation ([place])   . Blindness of left eye   . Decreased visual acuity    Left eye  . 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 Hair loss   . History of fracture of clavicle 05/18/2015  . Hyperlipidemia   . Pap smear abnormality of cervix with LGSIL   . Routine/ritual circumcision   . Type II diabetes mellitus ([place])   . Uveitis     Assessment: 48yo female c/o N/V, near syncope, and HA w/ sudden onset >> found to be in Afib w/ RVR, to begin heparin.  Goal of Therapy:  Heparin level 0.3-0.7 units/ml Monitor platelets by anticoagulation protocol: Yes   Plan:  Will give heparin 4000 units IV bolus x1 followed by gtt at 1200 units/hr and monitor heparin levels and CBC.  Wynona Neat, PharmD, BCPS  09/20/2018,3:22 AM

## 2018-09-20 NOTE — Progress Notes (Signed)
  Echocardiogram 2D Echocardiogram has been performed.  Jennifer [name] 09/20/2018, 10:40 AM

## 2018-09-20 NOTE — H&P (Addendum)
Date: 09/20/2018               Patient Name:  Jennifer [name] MRN: [id number]  DOB: 1970-11-11 Age / Sex: 48 y.o., female   PCP: Alphonzo Grieve, MD         Medical Service: Internal Medicine Teaching Service         Attending Physician: Dr. Rebeca Alert    First Contact: Dr. Eileen Stanford  Pager: [phone]  Second Contact: Dr. Berline Lopes Pager: [phone]       After Hours (After 5p/  First Contact Pager: [phone]  weekends / holidays): Second Contact Pager: [phone]   Chief Complaint:  Emesis, Headache   History of Present Illness:  *Interview was conducted using an Arabic interpreter, ID number [id number]*  Jennifer [name] is a 48 year old Arabic speaking woman with poorly controlled type 2 diabetes mellitus, hyperlipidemia, hypertension, iron deficiency anemia, vitamin D deficiency, glaucoma of the left eye who presented to the emergency department on Sep 20, 2018 with headache, emesis and palpitation.   She was in her usual state of health until around 4 PM on the day of admission when she began experiencing an 8 out of 10 bifrontal headache which radiated to her neck.  She is unable to qualify the headache but states it was a "strong headache."Since onset, she had about 10 episodes of nonbilious nonbloody emesis which was accompanied with palpitation.  She denies any aggravating factors but does report some relief with medication.  She had an episode of dizziness yesterday but none today.  She denies chest pain, shortness of breath, abdominal pain.  At the time of our encounter, she was feeling much better in regards to her headache.  She usually does not experience daily or frequent headaches.  Her last episode of headache was in February 2020 when she presented to the emergency department and was prescribed a migraine cocktail which alleviated her headache.  She denies any new medications with exception of insulin, eyedrops and Tylenol.  She has not taking any over-the-counter or herbal medications.  ED  course: BP range 90s-130s/50s- 80s, HR range 120s-140s, RR range 17-30, SPO2 95% on room air, CBC unremarkable, BMP unremarkable with exception of hyperglycemia of 407, SARS-CoV-2 negative, EKG shows atrial fibrillation with HR in the 110s, CT head shows no acute intracranial process.  Management in the ED included Benadryl, Compazine, heparin infusion, diltiazem infusion and normal saline bolus.  Meds:  No outpatient medications have been marked as taking for the 09/19/18 encounter [facility] Encounter).   No current facility-administered medications on file prior to encounter.    Current Outpatient Medications on File Prior to Encounter  Medication Sig Dispense Refill  . acetaminophen (TYLENOL) 500 MG tablet Take 1,000 mg by mouth every 6 (six) hours as needed for pain.    Marland Kitchen acetaZOLAMIDE (DIAMOX) 250 MG tablet Take 500 mg by mouth 2 (two) times daily.     Marland Kitchen atorvastatin (LIPITOR) 40 MG tablet Take 1 tablet (40 mg total) by mouth daily. 90 tablet 3  . Blood Glucose Monitoring Suppl (ACCU-CHEK NANO SMARTVIEW) w/Device KIT 1 kit 1 kit 0  . brimonidine (ALPHAGAN) 0.2 % ophthalmic solution Place 1 drop into the left eye 3 (three) times daily.     . Continuous Blood Gluc Receiver (FREESTYLE LIBRE 14 DAY READER) DEVI Inject 1 Device into the skin See admin instructions. 1 Device 0  . Continuous Blood Gluc Sensor (FREESTYLE LIBRE 14 DAY SENSOR) MISC 1 each by Does not apply route 4 (  four) times daily. 2 each 12  . diclofenac sodium (VOLTAREN) 1 % GEL Apply 2 g topically 4 (four) times daily. (Patient not taking: Reported on 06/12/2018) 100 g 0  . dorzolamide-timolol (COSOPT) 22.3-6.8 MG/ML ophthalmic solution Place 1 drop into the left eye 2 (two) times daily.    . DULoxetine (CYMBALTA) 60 MG capsule Take 1 capsule (60 mg total) by mouth daily. 90 capsule 1  . empagliflozin (JARDIANCE) 25 MG TABS tablet Take 25 mg by mouth daily.    . ferrous sulfate 325 (65 FE) MG tablet Take 1 tablet (325 mg total) by  mouth daily. (Patient not taking: Reported on 06/12/2018) 30 tablet 11  . folic acid (FOLVITE) 1 MG tablet Take 1 tablet (1 mg total) by mouth daily. (Patient not taking: Reported on 06/12/2018) 90 tablet 1  . furosemide (LASIX) 20 MG tablet Take 1 tablet (20 mg total) by mouth daily as needed. (Patient taking differently: Take 20 mg by mouth daily as needed for fluid. ) 30 tablet 3  . glucose blood (ACCU-CHEK SMARTVIEW) test strip CHECK BLOOD SUGAR 4 TIMES DAILY BEFORE MEALS AND AT BEDTIME 300 each 5  . ibuprofen (ADVIL,MOTRIN) 800 MG tablet Take 800 mg by mouth every 6 (six) hours as needed for mild pain.    . Insulin NPH, Human,, Isophane, (HUMULIN N) 100 UNIT/ML Kiwkpen INJECT 45 UNITS SUBCUTANEOUSLY EVERY MORNING AND 35 UNITS EVERY EVENING. Diag code E11.9 45 mL 3  . Insulin Pen Needle 31G X 5 MM MISC 1 Dose by Does not apply route 2 (two) times daily. 100 each 11  . ketorolac (ACULAR) 0.5 % ophthalmic solution Place 1 drop into both eyes 4 (four) times daily.    . Lancets (ACCU-CHEK SOFT TOUCH) lancets Check Blood Sugar 4 times daily before meals and at bedtime. Dx code: E11.321 100 each 11  . liraglutide (VICTOZA) 18 MG/3ML SOPN Inject 0.1 mLs (0.6 mg total) into the skin daily. After 1 week increase to 0.21m (1.257mtotal) into skin daily (Patient taking differently: Inject 1.2 mg into the skin daily. ) 6 mL 2  . lisinopril (PRINIVIL,ZESTRIL) 10 MG tablet Take 1 tablet (10 mg total) by mouth daily. 30 tablet 3  . metFORMIN (GLUCOPHAGE) 1000 MG tablet TAKE 1 TABLET (1,000 MG TOTAL) BY MOUTH 2 (TWO) TIMES DAILY WITH A MEAL. 180 tablet 1  . methotrexate (RHEUMATREX) 2.5 MG tablet Take 20 mg by mouth once a week. Caution:Chemotherapy. Protect from light.    . naproxen sodium (ALEVE) 220 MG tablet Take 220 mg by mouth daily as needed (pain).    . vitamin C (ASCORBIC ACID) 500 MG tablet Take 1 tablet (500 mg total) by mouth daily. 90 tablet 3  . Vitamin D, Cholecalciferol, 1000 units CAPS Take 1,000 mg  by mouth daily with breakfast. 60 capsule 5     Allergies: Allergies as of 09/19/2018  . (No Known Allergies)   Past Medical History:  Diagnosis Date  . Anemia, iron deficiency   . [place]  . Blindness of left eye   . Decreased visual acuity    Left eye  . 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 Hair loss   . History of fracture of clavicle 05/18/2015  . Hyperlipidemia   . Pap smear abnormality of cervix with LGSIL   . Routine/ritual circumcision   . Type II diabetes  mellitus ([place])   . Uveitis     Family History: "Heart disease in family, "blindness and diabetes in mother  Social History: Currently unemployed, denies alcohol use, cigarette use or illicit drug use.  Review of Systems: A complete ROS was negative except as per HPI.   Physical Exam: Blood pressure 110/79, pulse (!) 146, temperature 98 F (36.7 C), temperature source Oral, resp. rate (!) 22, height '5\' 6"'  (1.676 m), weight 98.1 kg, SpO2 95 %. Physical Exam Vitals signs and nursing note reviewed.  Constitutional:      General: She is not in acute distress.    Appearance: She is well-developed. She is obese. She is not toxic-appearing or diaphoretic.  HENT:     Head: Normocephalic and atraumatic.  Eyes:     General: No visual field deficit or scleral icterus.    Pupils: Pupils are equal.     Comments: Left eye, hazy appearing-glaucoma  Neck:     Musculoskeletal: Normal range of motion and neck supple. No neck rigidity.     Meningeal: Brudzinski's sign and Kernig's sign absent.  Cardiovascular:     Rate and Rhythm: Tachycardia present. Rhythm irregular.     Heart sounds: Normal heart sounds. No murmur. No friction rub. No gallop.   Pulmonary:     Effort: Pulmonary effort is normal. No respiratory distress.     Breath sounds: Normal breath sounds. No  wheezing, rhonchi or rales.  Abdominal:     General: Bowel sounds are normal.     Palpations: Abdomen is soft.     Tenderness: There is no abdominal tenderness.  Musculoskeletal: Normal range of motion.        General: No swelling or tenderness.  Neurological:     Mental Status: She is alert.     Cranial Nerves: No cranial nerve deficit or facial asymmetry.     Sensory: No sensory deficit.     Motor: No weakness.  Psychiatric:        Mood and Affect: Mood normal.     EKG: personally reviewed my interpretation is atrial fibrillation   Assessment & Plan by Problem: Active Problems:   Atrial fibrillation with RVR [facility])  Jennifer [name] is a 48 year old Arabic speaking woman with poorly controlled T2DM, HLD, HTN, iron deficiency anemia, glaucoma of the left eye, vitamin D deficiency here with new onset atrial fibrillation.   #Headache-likely tension type headache vs Migraine: 1 day history of bifrontal headache with radiation to her neck and associated with emesis.  She received migraine cocktail in ED with much improvement.  CT head unremarkable.  No history of malignancy.  Physical exam without focal neurological deficits. -Continue to monitor -Tylenol as needed  #Atrial fibrillation-unknown chronicity: Presents with 1 day history of palpitation.  She was found to be in A. fib with RVR in the ED as evident on EKG and elevated heart rate with range 120s-140s.  The etiology of her new onset atrial fibrillation is currently unknown as she does not have any pulmonary disease, structural or valvular heart disease, ischemic disease, thyroid dysfunction, illicit drug or alcohol intoxication. CHA2DS2-VASc score of 3 (female sex, hypertension and diabetes). HAS-BLED score of 1.  -Follow-up TSH -Follow-up echocardiogram -Continue cardiac monitoring -Continue diltiazem drip -Continue heparin  #Type 2 diabetes mellitus-uncontrolled:  - Follow-up hemoglobin A1c - Continue home Novolin 35 U  breakfast/ 25 U bedtime, sliding scale insulin  #Hypertension: - Continue lisinopril  #Multiple ophthalmology disease (Glaucoma, Pseudophakia of right eye, Nuclear sclerosis, left) -Continue Diamox -  Continue all ophthalmic solutions -Takes weekly methotrexate (last administration unclear) -Per ophthalmology, supposed to take azathioprine  FEN: Replace electrolytes as needed, carb modified diet VTE ppx: Heparin CODE STATUS: Full code  Dispo: Admit patient to Inpatient with expected length of stay greater than 2 midnights.  Signed: Jean Rosenthal, MD 09/20/2018, 5:20 AM  Pager: [phone] IMTS PGY-1

## 2018-09-20 NOTE — Progress Notes (Signed)
Subjective: Patient said that her migraine symptoms had resolved today.  She was no longer sprinting the nausea or vomiting.  She was at advised as to why she had been admitted and agreed to remain hospitalized for further evaluation for the cause of her atrial fibrillation with rapid ventricular rate.  Objective:  Vital signs in last 24 hours: Vitals:   09/20/18 0430 09/20/18 0500 09/20/18 0600 09/20/18 0800  BP: 110/79 123/75 (!) 110/96 115/73  Pulse: (!) 144 (!) 146 (!) 131 74  Resp: (!) 22 (!) 22 (!) 23 16  Temp:   98 F (36.7 C) 98 F (36.7 C)  TempSrc:   Oral Oral  SpO2: 95% 95% 100% 100%  Weight:   91 kg   Height:   5\' 6"  (1.676 m)    General: A/O x4, in no acute distress, afebrile, nondiaphoretic Cardio: Regular rate, irregular rhythm, no mrg's  Pulmonary: CTA bilaterally, no wheezing or crackles  Abdomen: Bowel sounds normal, soft, nontender  MSK: BLE nontender, nonedematous Psych: Appropriate affect, not depressed in appearance, engages well Assessment/Plan:  Active Problems:   Atrial fibrillation with RVR (HCC)   Migraine  Tamber Burtch is a 48 yo female with a PMHx notable for uncontrolled DMII, HLD, HTN, iron deficiency anemia, vitamin D deficiency, glaucoma of the left eye who presented to the emergency department with severe migrainous-like symptoms, emesis and palpitations.  Despite effective therapy for her migraine, she was noted to be in A. fib with RVR for which she was admitted for evaluation and management.  Plan: Atrial fibrillation with rapid ventricular rate: Rate has decreased below 100.  Patient has remained in A. fib/flutter pattern since admission.  No clear etiology currently, will need echo for better determination as rheumatic heart disease remains in the differential given her lack of clear prolonged hypertension or CAD history.  TSH normal.  Hemoglobin A1c15.2%.  No reported history of recent viral illness.  Patient may benefit from early  cardioversion, will consult cardiology. - Echocardiogram in process -Cardiology Dr. Doylene Canard consulted who will see patient and determine need to convert IV to PO rate control and consider cardioversion post echo  Migraine: Resolved. Will need to follow-up with PCP and may benefit from early abortive therapy.  - We will consider repeat migraine cocktail if symptoms recur  DMII: Uncontrolled with A1c of 15.2% reportedly on metformin and insulin humulin 45U am and 35U pm as well as victoza but it does not appear she is taking the victoza based on refill history. CBG's markedly elevated today.  We will need to adjust her insulin based on results of her morning dose.  It appears per chart review she has had frequent episodes of hypoglycemia on numerous regimens of long-acting and short acting insulin. She is most comfortable on her current outpatient regimen of 45/35 NPH, but due to intermittent PO intake we have initiated a lower dose.  She has tried and failed an SGLT2 inhibitor.  We will closely monitor her blood glucose today and adjust based on results. CBG @ 0632 reading of 441 with 15 aspart and 35 NPH given. -Ordered CBG midmorning and noon -Continue insulin NPH 35/25 units for now -Continue SSI moderate -We will adjust based on subsequent CBG readings  HTN: Normotensive.  We will continue lisinopril 10 mg daily  Diet: Heart healthy carb modified Code: Full Fluids: N/A DVT P PX: Heparin Dispo: Anticipated discharge in approximately 1-2 day(s).   Kathi Ludwig, MD 09/20/2018, 10:13 AM Pager: Pager# [phone]

## 2018-09-21 DIAGNOSIS — E118 Type 2 diabetes mellitus with unspecified complications: Secondary | ICD-10-CM

## 2018-09-21 DIAGNOSIS — I480 Paroxysmal atrial fibrillation: Principal | ICD-10-CM

## 2018-09-21 DIAGNOSIS — Z7901 Long term (current) use of anticoagulants: Secondary | ICD-10-CM

## 2018-09-21 LAB — CBC
HCT: 36.1 % (ref 36.0–46.0)
Hemoglobin: 11.3 g/dL — ABNORMAL LOW (ref 12.0–15.0)
MCH: 26.9 pg (ref 26.0–34.0)
MCHC: 31.3 g/dL (ref 30.0–36.0)
MCV: 86 fL (ref 80.0–100.0)
Platelets: 164 10*3/uL (ref 150–400)
RBC: 4.2 MIL/uL (ref 3.87–5.11)
RDW: 13 % (ref 11.5–15.5)
WBC: 10.5 10*3/uL (ref 4.0–10.5)
nRBC: 0 % (ref 0.0–0.2)

## 2018-09-21 LAB — GLUCOSE, CAPILLARY
Glucose-Capillary: 198 mg/dL — ABNORMAL HIGH (ref 70–99)
Glucose-Capillary: 106 mg/dL — ABNORMAL HIGH (ref 70–99)

## 2018-09-21 LAB — HEPARIN LEVEL (UNFRACTIONATED): Heparin Unfractionated: 0.3 IU/mL (ref 0.30–0.70)

## 2018-09-21 MED ORDER — DILTIAZEM HCL ER 60 MG PO CP12: 60.0000 mg | ORAL_CAPSULE | Freq: Two times a day (BID) | ORAL | 3 refills | Status: DC

## 2018-09-21 MED ORDER — DILTIAZEM HCL ER 60 MG PO CP12
60.0000 mg | ORAL_CAPSULE | Freq: Two times a day (BID) | ORAL | Status: DC
  Administered 2018-09-21: 60 mg via ORAL
  Filled 2018-09-21 (×3): qty 1

## 2018-09-21 MED ORDER — APIXABAN 5 MG PO TABS
5.0000 mg | ORAL_TABLET | Freq: Two times a day (BID) | ORAL | Status: DC
  Administered 2018-09-21: 5 mg via ORAL
  Filled 2018-09-21: qty 1

## 2018-09-21 MED ORDER — APIXABAN 5 MG PO TABS: 5.0000 mg | ORAL_TABLET | Freq: Two times a day (BID) | ORAL | 3 refills | Status: DC

## 2018-09-21 MED ORDER — DILTIAZEM HCL ER 60 MG PO CP12: 120.0000 mg | ORAL_CAPSULE | Freq: Two times a day (BID) | ORAL | Status: DC

## 2018-09-21 NOTE — Progress Notes (Signed)
Reviewed discharge instructions w/patient via Arabic translator [phone]; pt states verbal understanding.  Diabetes Coordinator also discussed concerns w/patient about her high blood sugars.  Patient states she understands.  Discharge instructions were also reviewed w/her son, Jennifer [name]; including instruction to pick up two RX, Eliquis and Cardizem, and provided a coupon for the Eliquis.  Questions answered w/stated verbal understanding.

## 2018-09-21 NOTE — Plan of Care (Signed)
Patient has met care goals; discharge pending

## 2018-09-21 NOTE — Progress Notes (Signed)
   Subjective: HD#1   Overnight: Converted to sinus rhythm overnight   Today, Jennifer [name] was examined and evaluated at bedside with aid of interpreter [phone] [facility]). She denies any chest pain or palpitations. No complaints at this time. Mentions resolution of her presenting symptoms, including nausea, vomiting or headaches. Discussed in detail about her arrythmia and explained her current sinus rhythm. Explained discharge planning with anti-coagulation and rhythm control. Jennifer [name] expressed understanding and gratitude for her care.  Objective:  Vital signs in last 24 hours: Vitals:   09/20/18 1700 09/20/18 1900 09/20/18 2300 09/21/18 0300  BP: 127/73 (!) 101/53 (!) 124/54 138/68  Pulse: 64 68 68 66  Resp: (!) 23 16 (!) 24 20  Temp:  98.3 F (36.8 C) 97.6 F (36.4 C) 98.2 F (36.8 C)  TempSrc:  Oral Oral Oral  SpO2:  100% 97% 99%  Weight:      Height:       Const: In NAD, sitting on bed comfortably.  HEENT: + JVD up to mandible Resp: CTABL, no wheezes, crackles, rhonchi  CV:RRR, no MGR Ext:no LE edema  Assessment/Plan:  Principal Problem:   Atrial fibrillation with RVR (HCC) Active Problems:   Type II diabetes mellitus ([place])   Hypertension associated with diabetes ([place])   Migraine  Jennifer [name] is a 48 yo female with a PMHx notable for uncontrolled DMII, HLD, HTN, iron deficiency anemia, vitamin D deficiency, glaucoma of the left eye who presented to the emergency department with severe migrainous-like symptoms, emesis and palpitations.  Despite effective therapy for her migraine, she was noted to be in A. fib with RVR for which she was admitted for evaluation and management.  #Paroxysmal atrial fibrillation with rapid ventricular rate: She converted to SR overnight and unclear if this was secondary to the digoxin dose she received or if this was a spontaneous event,. Echo revealed LVEF of 50-55%.  Mild concentric LVH, mild to moderate dilated left atrium, mild  to moderate mitral regurgitation.  Given that she is currently in sinus rhythm, TEE with cardioversion is currently not indicated. -Appreciate cardiology recommendation -Transition IV diltiazem to p.o. rate control medication (Diltiazem 240 mg daily) -Start an anticoagulation agent at discharge -Continue cardiac monitoring  #Migraine Resolved.  She currently denies headaches, nausea or emesis. -Consider repeat migraine cocktail if symptoms recur  #DMII-poorly controlled: Per chart review, she has been on multiple insulin regimen including Lantus, NPH, Victoza as well as SGLT2 inhibitor.  Managing her diabetes has been complicated with hypoglycemic agents in the past. -Continue to monitor CBGs -Continue insulin NPH 35/35 units for now -Continue SSI moderate  #HTN: Stable.  Blood pressure this a.m. 127/72. -Continue lisinopril 10 mg daily  Diet: Heart healthy carb modified Code: Full Fluids: N/A DVT P PX: Heparin Dispo: Anticipated discharge in approximately 1-2 day(s).   Jean Rosenthal, MD 09/21/2018, 6:04 AM Pager: [phone] IMTS PGY-1

## 2018-09-21 NOTE — Progress Notes (Signed)
Met with pt today to discuss current A1c of 15.2% and Home DM medication regimen.  Used [employer] for interpreter services [phone]  Verified home DM medications: Jardiance 25 mg daily NPH Insulin 45 units AM/ 35 units PM Victoza 1.2 mg Daily Metformin 1000 mg BID  Pt stated she is taking her medications.  Stated she understands how to give her injections.  Relayed to pt the severity of her Current A1c and how high blood sugars have detrimental effects on the body.  Pt stated to me she has has several sick family members taht have been putting emotional stress on her leading to high CBGs.  Has CBG meter at home.  Unsure pt actually checking CBGs at home.  I also question if pt is actually taking her insulin, given that we have started her back on her NPH and her CBGs have rapidly improved.  Pt to follow up with the Internal Medicine clinic after d/c.    --Will follow patient during hospitalization--  Wyn Quaker RN, MSN, CDE Diabetes Coordinator Inpatient Glycemic Control Team Team Pager: [phone] (8a-5p)

## 2018-09-21 NOTE — TOC Transition Note (Signed)
Transition of Care [facility]) - CM/SW Discharge Note   Patient Details  Name: Jennifer [name] MRN: [id number] Date of Birth: May 01, 1970  [facility]) CM/SW Contact:  Zenon Mayo, RN Phone Number: 09/21/2018, 11:42 AM   Clinical Narrative:    From home, afib, will be on eliquis, NCM gave patient the eliquis coupon for 30 days free and she states she also has Medicaid which co pay is 3.00. she has transportation to go home. No other needs.   Final next level of care: Home/Self Care Barriers to Discharge: No Barriers Identified   Patient Goals and CMS Choice Patient states their goals for this hospitalization and ongoing recovery are:: get better   Choice offered to / list presented to : NA  Discharge Placement                       Discharge Plan and Services In-house Referral: NA Discharge Planning Services: CM Consult, Medication Assistance Post Acute Care Choice: NA          DME Arranged: N/A DME Agency: NA       HH Arranged: NA HH Agency: NA        Social Determinants of Health (SDOH) Interventions     Readmission Risk Interventions No flowsheet data found.

## 2018-09-21 NOTE — TOC Benefit Eligibility Note (Signed)
Transition of Care [facility]) Benefit Eligibility Note    Patient Details  Name: Jennifer [name] MRN: [id number] Date of Birth: [birth year]   Medication/Dose: Arne Cleveland  2.5 MG BID                       Additional Notes: PRIMARY INS: MEDICAID [place] ACCESS(EFF-DATE: 09-20-2017, CO-PAY- $ 3.90 FOR EACH PRESCRIPTION)    Memory Argue Phone Number: 09/21/2018, 12:31 PM

## 2018-09-21 NOTE — Consult Note (Signed)
Ref: Alphonzo Grieve, MD   Subjective:  Converted to sinus rhythm. HR in 60's to 70's. BP is improved. Now on oral diltiazem and Eliquis 5 mg.bid.  Objective:  Vital Signs in the last 24 hours: Temp:  [97.6 F (36.4 C)-98.3 F (36.8 C)] 98 F (36.7 C) (06/01 0822) Pulse Rate:  [58-106] 68 (06/01 0822) Cardiac Rhythm: Normal sinus rhythm (06/01 0850) Resp:  [16-32] 17 (06/01 0822) BP: (98-138)/(53-75) 127/72 (06/01 0822) SpO2:  [97 %-100 %] 100 % (06/01 0850)  Physical Exam: BP Readings from Last 1 Encounters:  09/21/18 127/72     Wt Readings from Last 1 Encounters:  09/20/18 91 kg    Weight change:  Body mass index is 32.38 kg/m. HEENT: [place]/AT, Eyes-Brown, left cornea is hazy, Conjunctiva-Pink, Sclera-Non-icteric Neck: No JVD, No bruit, Trachea midline. Lungs:  Clear, Bilateral. Cardiac:  Regular rhythm, normal S1 and S2, no S3. II/VI systolic murmur. Abdomen:  Soft, non-tender. BS present. Extremities:  No edema present. No cyanosis. No clubbing. CNS: AxOx3, Cranial nerves grossly intact, moves all 4 extremities.  Skin: Warm and dry.   Intake/Output from previous day: 05/31 0701 - 06/01 0700 In: 828.3 [P.O.:440; I.V.:388.3] Out: 800 [Urine:800]    Lab Results: BMET    Component Value Date/Time   NA 137 09/19/2018 2130   NA 135 08/22/2018 2057   NA 136 06/12/2018 1146   NA 136 01/30/2018 1440   NA 137 02/21/2017 1413   K 4.0 09/19/2018 2130   K 4.2 08/22/2018 2057   K 3.4 (L) 06/12/2018 1146   CL 103 09/19/2018 2130   CL 102 08/22/2018 2057   CL 104 06/12/2018 1146   CO2 24 09/19/2018 2130   CO2 23 08/22/2018 2057   CO2 23 06/12/2018 1146   GLUCOSE 407 (H) 09/19/2018 2130   GLUCOSE 351 (H) 08/22/2018 2057   GLUCOSE 164 (H) 06/12/2018 1146   BUN 15 09/19/2018 2130   BUN 19 08/22/2018 2057   BUN 17 06/12/2018 1146   BUN 17 01/30/2018 1440   BUN 18 02/21/2017 1413   CREATININE 0.66 09/19/2018 2130   CREATININE 0.76 08/22/2018 2057   CREATININE 0.57  06/12/2018 1146   CREATININE 0.63 10/28/2014 1649   CREATININE 0.53 02/18/2014 1653   CREATININE 0.61 07/07/2012 1601   CALCIUM 8.8 (L) 09/19/2018 2130   CALCIUM 8.8 (L) 08/22/2018 2057   CALCIUM 8.8 (L) 06/12/2018 1146   GFRNONAA >60 09/19/2018 2130   GFRNONAA >60 08/22/2018 2057   GFRNONAA >60 06/12/2018 1146   GFRNONAA >89 10/28/2014 1649   GFRNONAA >89 02/18/2014 1653   GFRNONAA >89 07/07/2012 1601   GFRAA >60 09/19/2018 2130   GFRAA >60 08/22/2018 2057   GFRAA >60 06/12/2018 1146   GFRAA >89 10/28/2014 1649   GFRAA >89 02/18/2014 1653   GFRAA >89 07/07/2012 1601   CBC    Component Value Date/Time   WBC 10.5 09/21/2018 0334   RBC 4.20 09/21/2018 0334   HGB 11.3 (L) 09/21/2018 0334   HGB 11.8 02/21/2017 1413   HCT 36.1 09/21/2018 0334   HCT 37.2 02/21/2017 1413   PLT 164 09/21/2018 0334   PLT 186 02/21/2017 1413   MCV 86.0 09/21/2018 0334   MCV 83 02/21/2017 1413   MCH 26.9 09/21/2018 0334   MCHC 31.3 09/21/2018 0334   RDW 13.0 09/21/2018 0334   RDW 13.7 02/21/2017 1413   LYMPHSABS 0.9 09/19/2018 2130   MONOABS 0.2 09/19/2018 2130   EOSABS 0.0 09/19/2018 2130   BASOSABS 0.0  09/19/2018 2130   HEPATIC Function Panel Recent Labs    10/13/17 1656 01/30/18 1440 08/22/18 2057  PROT 7.4 6.8 7.1   HEMOGLOBIN A1C No components found for: HGA1C,  MPG CARDIAC ENZYMES Lab Results  Component Value Date   CKTOTAL 55 06/19/2017   BNP No results for input(s): PROBNP in the last 8760 hours. TSH Recent Labs    09/20/18 0508  TSH 0.856   CHOLESTEROL Recent Labs    11/05/17 1517  CHOL 196    Scheduled Meds: . acetaZOLAMIDE  500 mg Oral BID  . apixaban  5 mg Oral BID  . atorvastatin  40 mg Oral Daily  . brimonidine  1 drop Left Eye TID  . cholecalciferol  1,000 Units Oral Q breakfast  . diltiazem  60 mg Oral Q12H  . dorzolamide-timolol  1 drop Left Eye BID  . DULoxetine  60 mg Oral Daily  . insulin aspart  0-15 Units Subcutaneous TID WC  . insulin NPH  Human  35 Units Subcutaneous QHS   And  . insulin NPH Human  35 Units Subcutaneous QAC breakfast  . ketorolac  1 drop Both Eyes QID  . lisinopril  10 mg Oral Daily  . sodium chloride flush  3 mL Intravenous Q12H  . vitamin C  500 mg Oral Daily   Continuous Infusions: PRN Meds:.acetaminophen **OR** acetaminophen  Assessment/Plan: Atrial fibrillation, paroxysmal Hyperlipidemia Obesity Type 2 DM, uncontrolled but improving Left eye glaucoma  Continue medications. F/U in 1 week.   LOS: 1 day    Dixie Dials  MD  09/21/2018, 9:59 AM

## 2018-09-21 NOTE — Progress Notes (Signed)
Inpatient Diabetes Program Recommendations  AACE/ADA: New Consensus Statement on Inpatient Glycemic Control (2015)  Target Ranges:  Prepandial:   less than 140 mg/dL      Peak postprandial:   less than 180 mg/dL (1-2 hours)      Critically ill patients:  140 - 180 mg/dL   Results for Jennifer [name], Jennifer [name] (MRN [id number]) as of 09/21/2018 07:23  Ref. Range 09/20/2018 06:32 09/20/2018 10:59 09/20/2018 16:06 09/20/2018 21:12  Glucose-Capillary Latest Ref Range: 70 - 99 mg/dL   Got 10 mg Decadron at 10pm the night prior 441 (H)  15 units NOVOLOG +  35 units NPH Insulin given at 8:46am  392 (H)  15 units NOVOLOG  348 (H)  15 units NOVOLOG  363 (H)     35 units NPH Insulin given at 10:21pm    Results for Jennifer [name], Jennifer [name] (MRN [id number]) as of 09/21/2018 07:23  Ref. Range 09/21/2018 06:28  Glucose-Capillary Latest Ref Range: 70 - 99 mg/dL 198 (H)    Results for Jennifer [name], Jennifer [name] (MRN [id number]) as of 09/21/2018 07:23  Ref. Range 11/05/2017 15:31 05/20/2018 16:14 09/20/2018 05:34  Hemoglobin A1C Latest Ref Range: 4.8 - 5.6 % >14.0 (A) >14.0 (A) 15.2 (H)  (389 mg/dl)    Admit with: Headache/ Emesis/ A Fib with RVR History: DM  Home DM Meds: Jardiance 25 mg daily       NPH Insulin 45 units AM/ 35 units PM       Victoza 1.2 mg Daily       Metformin 1000 mg BID  Current Orders: NPH Insulin 35 units QAM      NPH Insulin 35 units QHS      Novolog Moderate Correction Scale/ SSI (0-15 units) TID AC      Per PCP records, looks like patient was started on Victoza back in January.  Current A1c shows extremely poor glucose control at home (15.2%)--A1c has increased but it looks as if pt has chronically uncontrolled CBGs at home.  Likely not taking meds at home??  Note that Debera Lat, CDE with Internal Medicine Team has worked with patient in the past.  Note NPH Insulin and Novolog both started yesterday (pt also had 10 mg Decadron X 1 dose on board from the night prior (05/30).  CBG down  to 198 mg/dl this AM.    --Will follow patient during hospitalization--  Wyn Quaker RN, MSN, CDE Diabetes Coordinator Inpatient Glycemic Control Team Team Pager: [phone] (8a-5p)     --Will follow patient during hospitalization--  Wyn Quaker RN, MSN, CDE Diabetes Coordinator Inpatient Glycemic Control Team Team Pager: [phone] (8a-5p)

## 2018-09-21 NOTE — Progress Notes (Signed)
[place] for heparin Indication: atrial fibrillation  No Known Allergies  Patient Measurements: Height: 5\' 6"  (167.6 cm) Weight: 200 lb 9.9 oz (91 kg) IBW/kg (Calculated) : 59.3 Heparin Dosing Weight: 80kg  Vital Signs: Temp: 98.2 F (36.8 C) (06/01 0300) Temp Source: Oral (06/01 0300) BP: 138/68 (06/01 0300) Pulse Rate: 66 (06/01 0300)  Labs: Recent Labs    09/19/18 2130 09/20/18 0954 09/20/18 1608 09/21/18 0334  HGB 12.4  --   --  11.3*  HCT 40.3  --   --  36.1  PLT 159  --   --  164  HEPARINUNFRC  --  0.42 0.32 0.30  CREATININE 0.66  --   --   --     Estimated Creatinine Clearance: 98.8 mL/min (by C-G formula based on SCr of 0.66 mg/dL).   Medical History: Past Medical History:  Diagnosis Date  . Anemia, iron deficiency   . Atrial fibrillation ([place])   . Blindness of left eye   . Decreased visual acuity    Left eye  . 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 Hair loss   . History of fracture of clavicle 05/18/2015  . Hyperlipidemia   . Pap smear abnormality of cervix with LGSIL   . Routine/ritual circumcision   . Type II diabetes mellitus ([place])   . Uveitis     Assessment: 48yo female c/o N/V, near syncope, and HA w/ sudden onset >> found to be in Afib w/ RVR, to begin heparin.  Heparin level therapeutic at 0.3 on heparin 1200 units/hr. Hgb dropped to 11.3 but no bleeding issues noted.    Goal of Therapy:  Heparin level 0.3-0.7 units/ml Monitor platelets by anticoagulation protocol: Yes   Plan:  Continue heparin infusion at 1200 units/hr Daily heparin level and cbc  Erin Hearing PharmD., BCPS Clinical Pharmacist 09/21/2018 8:06 AM

## 2018-09-25 ENCOUNTER — Ambulatory Visit: Payer: Medicaid Other | Admitting: Dietician

## 2018-09-25 ENCOUNTER — Encounter: Payer: Self-pay | Admitting: Dietician

## 2018-09-25 ENCOUNTER — Ambulatory Visit (INDEPENDENT_AMBULATORY_CARE_PROVIDER_SITE_OTHER): Payer: Medicaid Other | Admitting: Internal Medicine

## 2018-09-25 ENCOUNTER — Other Ambulatory Visit: Payer: Self-pay

## 2018-09-25 ENCOUNTER — Ambulatory Visit: Payer: Medicaid Other

## 2018-09-25 ENCOUNTER — Encounter: Payer: Self-pay | Admitting: Internal Medicine

## 2018-09-25 VITALS — BP 131/52 | HR 76 | Temp 98.4°F | Ht 66.0 in | Wt 202.8 lb

## 2018-09-25 DIAGNOSIS — I4891 Unspecified atrial fibrillation: Secondary | ICD-10-CM

## 2018-09-25 DIAGNOSIS — Z794 Long term (current) use of insulin: Secondary | ICD-10-CM

## 2018-09-25 DIAGNOSIS — Z7901 Long term (current) use of anticoagulants: Secondary | ICD-10-CM | POA: Diagnosis not present

## 2018-09-25 DIAGNOSIS — E11319 Type 2 diabetes mellitus with unspecified diabetic retinopathy without macular edema: Secondary | ICD-10-CM

## 2018-09-25 DIAGNOSIS — E118 Type 2 diabetes mellitus with unspecified complications: Secondary | ICD-10-CM | POA: Diagnosis not present

## 2018-09-25 DIAGNOSIS — I480 Paroxysmal atrial fibrillation: Secondary | ICD-10-CM

## 2018-09-25 DIAGNOSIS — Z79899 Other long term (current) drug therapy: Secondary | ICD-10-CM | POA: Diagnosis not present

## 2018-09-25 NOTE — Progress Notes (Signed)
   CC: Atrial Fibrillation, Hospital follow-up, Diabetes  HPI:  [name] is a 48 y.o. F with PMHx listed below presenting for Atrial Fibrillation, Hospital follow-up, Diabetes. Please see the A&P for the status of the patient's chronic medical problems.  Past Medical History:  Diagnosis Date  . Anemia, iron deficiency   . Atrial fibrillation ([place])   . Blindness of left eye   . Decreased visual acuity    Left eye  . 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 Hair loss   . History of fracture of clavicle 05/18/2015  . Hyperlipidemia   . Pap smear abnormality of cervix with LGSIL   . Routine/ritual circumcision   . Type II diabetes mellitus ([place])   . Uveitis    Review of Systems:  Performed and all others negative.  Physical Exam:  Vitals:   09/25/18 0942  BP: (!) 131/52  Pulse: 76  Temp: 98.4 F (36.9 C)  TempSrc: Oral  SpO2: 100%  Weight: 202 lb 12.8 oz (92 kg)   Physical Exam Constitutional:      General: She is not in acute distress.    Appearance: Normal appearance.  Cardiovascular:     Rate and Rhythm: Normal rate and regular rhythm.     Pulses: Normal pulses.     Heart sounds: Normal heart sounds.  Pulmonary:     Effort: Pulmonary effort is normal. No respiratory distress.     Breath sounds: Normal breath sounds.  Abdominal:     General: Bowel sounds are normal. There is no distension.     Palpations: Abdomen is soft.     Tenderness: There is no abdominal tenderness.  Musculoskeletal:        General: No swelling or deformity.  Skin:    General: Skin is warm and dry.  Neurological:     General: No focal deficit present.     Mental Status: Mental status is at baseline.    Assessment & Plan:   See Encounters Tab for problem based charting.   Patient discussed with Dr. Daryll Drown

## 2018-09-25 NOTE — Patient Instructions (Addendum)
Thank you for allowing [place] to care for you  For your Atrial Fibrillation - Please continue to take Diltiazem and Eliquis as prescribed - Please follow up with cardiologist --- Make appointment by calling [phone]  For your diabetes - Continue medication regimen - Appointment to work with our nutritionist and diabetic coordinator today  Please follow up with PCP in the next 1 month

## 2018-09-25 NOTE — Patient Instructions (Addendum)
Please bring your Dexcom Continuous glucose monitor to your next appointment with me.   For your convenience, I made an appointment in 4 weeks on July 7, at 10:15 AM.   Please call me if this is not convenient for you.   Thank you!  [name] [phone]

## 2018-09-25 NOTE — Assessment & Plan Note (Signed)
Patient presenting for hospital follow up after being found to be in Afib with RVR when she had presented to the ED for a headache. Headache was treated and resolved in the ED, but she was noted to be in Afib and was started on a Dilt gtt, Heparin gtt, and admitted.   She was noted to have low BP and was given a dose of digoxin. She later spontaneously converted to Sinus rhythm. It is unclear how long she may have been in A fib or how frequently as she did not have any symptoms. She was converted to PO diltiazem and Eliquis for anticoagulation. She has been able to pick up and has tolerated these medications.  Etiology is unclear at this time. Possibly due to mild-moderate MR with Mild-moderate atrial dilation. TSH WNL, low-normal EF. She was to follow up with cardiology as well, but this appointment has not been made. Patient and her daughter have again been given the cardiologist's information and phone number. - Diltiazem 60mg  BID - Eliquis 5mg  BID - Cardiology follow up

## 2018-09-25 NOTE — Progress Notes (Signed)
Diabetes Self-Management Education  Visit Type:  Follow-up  Appt. Start Time: 1030 Appt. End Time: 6761  09/25/2018  [name], identified by name and date of birth, is a 48 y.o. female with a diagnosis of Diabetes:  Marland Kitchen  Type 2 diabetes  ASSESSMENT  Daughter reports her brother moved to [place] and she is expected to care for her mother. She wants to know what her mother should be eating. Ms. Lose reports using the Dexcom G6 CGM but does not have it on today. Suspect they need training and asked them to bring it to their next appointment.   Wt Readings from Last 5 Encounters:  09/25/18 202 lb 12.8 oz (92 kg)  09/20/18 200 lb 9.9 oz (91 kg)  08/22/18 216 lb 4.3 oz (98.1 kg)  05/20/18 216 lb 3.2 oz (98.1 kg)  01/30/18 220 lb 12.8 oz (100.2 kg)   Lab Results  Component Value Date   HGBA1C 15.2 (H) 09/20/2018   HGBA1C >14.0 (A) 05/20/2018   HGBA1C >14.0 (A) 11/05/2017   HGBA1C 14.0 (H) 06/19/2017   HGBA1C >14.0 02/21/2017      Diabetes Self-Management Education - 09/25/18 1000      Pre-Education Assessment   Patient understands incorporating nutritional management into lifestyle.  Needs Review   daughter reports she is now responsible for her mom     Complications   Last HgB A1C per patient/outside source  15.4 %      Exercise   Exercise Type  ADL's      Patient Education   Nutrition management   Role of diet in the treatment of diabetes and the relationship between the three main macronutrients and blood glucose level;Carbohydrate counting      Individualized Goals (developed by patient)   Nutrition  Follow meal plan discussed      Outcomes   Program Status  Completed      Subsequent Visit   Since your last visit have you continued or begun to take your medications as prescribed?  Yes    Since your last visit have you had your blood pressure checked?  Yes    Is your most recent blood pressure lower, unchanged, or higher since your last visit?  Unchanged    Since your last visit have you experienced any weight changes?  No change    Since your last visit, are you checking your blood glucose at least once a day?  Yes   reports using the dexcom CGM, forgot to put it on today      Learning Objective:  Patient will have a greater understanding of diabetes self-management. Patient education plan is to attend individual and/or group sessions per assessed needs and concerns.   Plan:   Patient Instructions  Please bring your Dexcom Continuous glucose monitor to your next appointment with me.   Please make an appointment in about 4 weeks.  Thank you!  [name]    Expected Outcomes:  Demonstrated interest in learning. Expect positive outcomesguarded  Air traffic controller provided: Meal plan card, My Plate and Snack sheet  If problems or questions, patient to contact team via:  Phone  Future DSME appointment: - 4-6 wks 4-6 weeks Debera Lat, RD 09/25/2018 11:04 AM.

## 2018-09-25 NOTE — Assessment & Plan Note (Signed)
Patient with chronically poor controlled diabetes. A1c during admission noted to be 15.2. Will need continued education on Diabetes and nutritional counseling. - Continue Humulin 45U qAM and 35U qPM - Continue Metformin and Victoza - Referral to Nutrition and Diabetes services - Follow up with PCP

## 2018-09-28 ENCOUNTER — Ambulatory Visit: Payer: Medicaid Other

## 2018-09-28 NOTE — Progress Notes (Signed)
Internal Medicine Clinic Attending  Case discussed with Dr. Melvin soon after the resident saw the patient.  We reviewed the resident's history and exam and pertinent patient test results.  I agree with the assessment, diagnosis, and plan of care documented in the resident's note.   

## 2018-10-27 ENCOUNTER — Ambulatory Visit: Payer: Medicaid Other | Admitting: Dietician

## 2018-11-19 ENCOUNTER — Other Ambulatory Visit: Payer: Self-pay | Admitting: Dietician

## 2018-11-19 DIAGNOSIS — E11319 Type 2 diabetes mellitus with unspecified diabetic retinopathy without macular edema: Secondary | ICD-10-CM

## 2018-11-19 MED ORDER — ACCU-CHEK GUIDE VI STRP: ORAL_STRIP | 12 refills | Status: DC

## 2018-11-19 MED ORDER — ACCU-CHEK GUIDE W/DEVICE KIT: 1.0000 | PACK | Freq: Four times a day (QID) | 1 refills | Status: DC

## 2018-11-19 MED ORDER — ACCU-CHEK FASTCLIX LANCETS MISC: 7 refills | Status: DC

## 2018-11-19 NOTE — Telephone Encounter (Signed)
Asked by nurse to assist with diabetes testing supplies

## 2019-01-20 ENCOUNTER — Ambulatory Visit (INDEPENDENT_AMBULATORY_CARE_PROVIDER_SITE_OTHER): Payer: Medicaid Other | Admitting: Internal Medicine

## 2019-01-20 ENCOUNTER — Other Ambulatory Visit: Payer: Self-pay

## 2019-01-20 ENCOUNTER — Encounter: Payer: Self-pay | Admitting: Internal Medicine

## 2019-01-20 VITALS — BP 149/88 | HR 81 | Temp 98.1°F | Ht 66.0 in | Wt 203.8 lb

## 2019-01-20 DIAGNOSIS — L97529 Non-pressure chronic ulcer of other part of left foot with unspecified severity: Secondary | ICD-10-CM | POA: Diagnosis not present

## 2019-01-20 DIAGNOSIS — E1139 Type 2 diabetes mellitus with other diabetic ophthalmic complication: Secondary | ICD-10-CM | POA: Diagnosis not present

## 2019-01-20 DIAGNOSIS — E1151 Type 2 diabetes mellitus with diabetic peripheral angiopathy without gangrene: Secondary | ICD-10-CM | POA: Diagnosis not present

## 2019-01-20 DIAGNOSIS — I4891 Unspecified atrial fibrillation: Secondary | ICD-10-CM

## 2019-01-20 DIAGNOSIS — I10 Essential (primary) hypertension: Secondary | ICD-10-CM

## 2019-01-20 DIAGNOSIS — Z794 Long term (current) use of insulin: Secondary | ICD-10-CM

## 2019-01-20 DIAGNOSIS — Z7901 Long term (current) use of anticoagulants: Secondary | ICD-10-CM | POA: Diagnosis not present

## 2019-01-20 DIAGNOSIS — D509 Iron deficiency anemia, unspecified: Secondary | ICD-10-CM

## 2019-01-20 DIAGNOSIS — E11621 Type 2 diabetes mellitus with foot ulcer: Secondary | ICD-10-CM | POA: Diagnosis not present

## 2019-01-20 DIAGNOSIS — H409 Unspecified glaucoma: Secondary | ICD-10-CM | POA: Diagnosis not present

## 2019-01-20 DIAGNOSIS — E11319 Type 2 diabetes mellitus with unspecified diabetic retinopathy without macular edema: Secondary | ICD-10-CM | POA: Diagnosis present

## 2019-01-20 DIAGNOSIS — E785 Hyperlipidemia, unspecified: Secondary | ICD-10-CM | POA: Diagnosis not present

## 2019-01-20 DIAGNOSIS — R51 Headache: Secondary | ICD-10-CM | POA: Diagnosis not present

## 2019-01-20 DIAGNOSIS — H42 Glaucoma in diseases classified elsewhere: Secondary | ICD-10-CM

## 2019-01-20 DIAGNOSIS — E1159 Type 2 diabetes mellitus with other circulatory complications: Secondary | ICD-10-CM | POA: Diagnosis not present

## 2019-01-20 DIAGNOSIS — Z79899 Other long term (current) drug therapy: Secondary | ICD-10-CM | POA: Diagnosis not present

## 2019-01-20 DIAGNOSIS — L659 Nonscarring hair loss, unspecified: Secondary | ICD-10-CM | POA: Diagnosis not present

## 2019-01-20 DIAGNOSIS — I480 Paroxysmal atrial fibrillation: Secondary | ICD-10-CM

## 2019-01-20 DIAGNOSIS — E782 Mixed hyperlipidemia: Secondary | ICD-10-CM

## 2019-01-20 LAB — POCT GLYCOSYLATED HEMOGLOBIN (HGB A1C): HbA1c POC (<> result, manual entry): >14 % — AB (ref 4.0–5.6)

## 2019-01-20 LAB — GLUCOSE, CAPILLARY: Glucose-Capillary: 458 mg/dL — ABNORMAL HIGH (ref 70–99)

## 2019-01-20 MED ORDER — APIXABAN 5 MG PO TABS: 5.0000 mg | ORAL_TABLET | Freq: Two times a day (BID) | ORAL | 1 refills | Status: DC

## 2019-01-20 MED ORDER — ATORVASTATIN CALCIUM 40 MG PO TABS: 40.0000 mg | ORAL_TABLET | Freq: Every day | ORAL | 3 refills | Status: DC

## 2019-01-20 MED ORDER — DILTIAZEM HCL ER 60 MG PO CP12: 60.0000 mg | ORAL_CAPSULE | Freq: Two times a day (BID) | ORAL | 1 refills | Status: DC

## 2019-01-20 MED ORDER — ACCU-CHEK FASTCLIX LANCETS MISC: 7 refills | Status: DC

## 2019-01-20 MED ORDER — LISINOPRIL 10 MG PO TABS: 10.0000 mg | ORAL_TABLET | Freq: Every day | ORAL | 3 refills | Status: DC

## 2019-01-20 MED ORDER — INSULIN ISOPHANE HUMAN 100 UNIT/ML KWIKPEN: PEN_INJECTOR | SUBCUTANEOUS | 3 refills | Status: DC

## 2019-01-20 NOTE — Patient Instructions (Addendum)
It was nice seeing you today! Thank you for choosing [facility] for your Primary Care.    Today we talked about:   1. Diabetes A1C today was very high, so it will be important we optimize your diabetes medication. I have put in a referral to our pharmacist to help with this and she will contact you.   Please bring all your medications to your next visit.     It was a pleasure meeting you and your daughter today!

## 2019-01-20 NOTE — Progress Notes (Signed)
   CC: Diabetes follow up   HPI:  [name] is a 48 y.o. with a PMHx of T2DM, PAF, HTN, L eye glaucoma, who presents to the clinic for diabetes follow up.   Please see the Encounters tab for problem-based Assessment & Plan regarding status of patient's chronic conditions.  Past Medical History:  Diagnosis Date  . Anemia, iron deficiency   . Atrial fibrillation ([place])   . Blindness of left eye   . Decreased visual acuity    Left eye  . 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 Hair loss   . History of fracture of clavicle 05/18/2015  . Hyperlipidemia   . Pap smear abnormality of cervix with LGSIL   . Routine/ritual circumcision   . Type II diabetes mellitus ([place])   . Uveitis    Review of Systems: Review of Systems  Constitutional: Negative for chills, fever and weight loss.  Respiratory: Negative for cough and shortness of breath.   Cardiovascular: Negative for chest pain, palpitations and leg swelling.  Gastrointestinal: Negative for abdominal pain, diarrhea, nausea and vomiting.  Genitourinary: Negative for dysuria and hematuria.  Musculoskeletal: Negative for joint pain and myalgias.  Skin: Negative for rash.       Hair loss, chronic Scarring on legs, chronic  Neurological: Positive for headaches (chronic). Negative for dizziness and focal weakness.   Physical Exam:  Vitals:   01/20/19 1054  BP: (!) 149/88  Pulse: 81  Temp: 98.1 F (36.7 C)  TempSrc: Oral  SpO2: 100%  Weight: 203 lb 12.8 oz (92.4 kg)  Height: 5\' 6"  (1.676 m)   Physical Exam Vitals signs and nursing note reviewed.  Constitutional:      General: She is not in acute distress.    Appearance: She is obese. She is not toxic-appearing.  Cardiovascular:     Rate and Rhythm: Normal rate and regular rhythm.     Heart sounds: No murmur. No gallop.   Pulmonary:   Effort: Pulmonary effort is normal. No respiratory distress.  Skin:    General: Skin is warm and dry.     Comments: 0.25 cm blister with central opening on left second toe, no pus or erythema Right great toenail ripped with some bleeding  Neurological:     Mental Status: She is alert and oriented to person, place, and time. Mental status is at baseline.     Sensory: No sensory deficit.    Assessment & Plan:   See Encounters Tab for problem based charting.  Patient seen with Dr. Evette Doffing

## 2019-01-21 LAB — BMP8+ANION GAP
Anion Gap: 15 mmol/L (ref 10.0–18.0)
BUN/Creatinine Ratio: 21 (ref 9–23)
BUN: 22 mg/dL (ref 6–24)
CO2: 21 mmol/L (ref 20–29)
Calcium: 9.5 mg/dL (ref 8.7–10.2)
Chloride: 96 mmol/L (ref 96–106)
Creatinine, Ser: 1.03 mg/dL — ABNORMAL HIGH (ref 0.57–1.00)
GFR calc Af Amer: 75 mL/min/{1.73_m2} (ref >59)
GFR calc non Af Amer: 65 mL/min/{1.73_m2} (ref >59)
Glucose: 461 mg/dL — ABNORMAL HIGH (ref 65–99)
Potassium: 4.5 mmol/L (ref 3.5–5.2)
Sodium: 132 mmol/L — ABNORMAL LOW (ref 134–144)

## 2019-01-21 LAB — CBC
Hematocrit: 39.1 % (ref 34.0–46.6)
Hemoglobin: 12.2 g/dL (ref 11.1–15.9)
MCH: 26.3 pg — ABNORMAL LOW (ref 26.6–33.0)
MCHC: 31.2 g/dL — ABNORMAL LOW (ref 31.5–35.7)
MCV: 84 fL (ref 79–97)
Platelets: 186 10*3/uL (ref 150–450)
RBC: 4.63 x10E6/uL (ref 3.77–5.28)
RDW: 12.1 % (ref 11.7–15.4)
WBC: 5.5 10*3/uL (ref 3.4–10.8)

## 2019-01-21 NOTE — Assessment & Plan Note (Signed)
Re-assessed anemia with CBC. Hemoglobin is stable. No further intervention required at this time.

## 2019-01-21 NOTE — Assessment & Plan Note (Signed)
Currently uncontrolled. She notes that she is taking all of her medications, however there seems to be some confusion regarding this. She states she is taking Jardiance, although it was discontinued a few months ago due to severe vulvovaginitis. She also reports taking Victoza, but was unaware it was an injectable. She is taking Metformin, but just once per day due to GI upset with BID dosing.   She denies any urinary symptoms, dizziness, or symptoms of hypogylcemia.   She did bring in her glucometer today. During September, her lowest BG was 144 once around dinner time. Her highs were around 550 with multiple during various times of the day, including morning and bedtime. Average overall was 447.   We discussed her prior history of CGM. She notes it was uncomfortable to sleep with, as she sleeps on her side. She is not interesting in trying it again.   Overall, difficult to manage medications today without being able to see exactly what medications she has at home and takes daily. Strongly encouraged she make another appointment when she can bring in every bottle and vial.   Plan:  - Increase Humulin to 40 units BID - Continue Metformin as she has been - Discontinue Jardiance  - Referral to Dr. Maudie Mercury, pharmacy, for help with medication management - Follow up as soon as possible with medications in tow

## 2019-01-21 NOTE — Assessment & Plan Note (Signed)
Refilled Atorvastatin 40mg  today. Will need lipid panel at next visit.

## 2019-01-21 NOTE — Assessment & Plan Note (Signed)
Diagnosed during hospitalization in June 2020 and discharged after spontaneously converting back to sinus rhythm. Discharged with Diltiazem and Eliquis. However, she report that she ran out and did not have any refills. Additionally, she has not been able to follow up with Cardiology. She denies any palpitations.   Plan:  - Refilled Diltiazem and Eliquis  - Strongly encouraged she make a follow up visit with Cardiology

## 2019-01-21 NOTE — Assessment & Plan Note (Signed)
BP today is 149/88. She reports taking Lisinopril as prescribed. She would benefit from additional medication treatment, however requires medication reconciliation at a time when she can physically bring in all of her medications. For now, refilled Lisinopril.

## 2019-01-24 NOTE — Progress Notes (Signed)
Internal Medicine Clinic Attending  I saw and evaluated the patient.  I personally confirmed the key portions of the history and exam documented by Dr. Basaraba and I reviewed pertinent patient test results.  The assessment, diagnosis, and plan were formulated together and I agree with the documentation in the resident's note.    

## 2019-01-25 ENCOUNTER — Ambulatory Visit: Payer: Medicaid Other | Admitting: Pharmacist

## 2019-01-27 ENCOUNTER — Telehealth: Payer: Self-pay | Admitting: *Deleted

## 2019-01-27 NOTE — Telephone Encounter (Signed)
Call to [employer] for PA for Humulin [employer].  Information was given.  Approved 01/27/2019 thru 01/22/2020.  [place] BP:[id number].  ITK:[id number].  [facility] was called and notified of. Sander Nephew, RN 01/27/2019  11:42 AM.

## 2019-02-01 ENCOUNTER — Ambulatory Visit: Payer: Medicaid Other | Admitting: Pharmacist

## 2019-02-05 LAB — HM DIABETES EYE EXAM

## 2019-02-10 ENCOUNTER — Ambulatory Visit: Payer: Medicaid Other | Admitting: Pharmacist

## 2019-02-10 NOTE — Addendum Note (Signed)
Addended by: Hulan Fray on: 02/10/2019 07:26 PM   Modules accepted: Orders

## 2019-03-03 ENCOUNTER — Ambulatory Visit (INDEPENDENT_AMBULATORY_CARE_PROVIDER_SITE_OTHER): Payer: Medicaid Other | Admitting: Internal Medicine

## 2019-03-03 VITALS — BP 135/73 | HR 78 | Temp 98.2°F | Ht 66.0 in | Wt 206.4 lb

## 2019-03-03 DIAGNOSIS — I4891 Unspecified atrial fibrillation: Secondary | ICD-10-CM

## 2019-03-03 DIAGNOSIS — E785 Hyperlipidemia, unspecified: Secondary | ICD-10-CM

## 2019-03-03 DIAGNOSIS — R609 Edema, unspecified: Secondary | ICD-10-CM

## 2019-03-03 DIAGNOSIS — Z794 Long term (current) use of insulin: Secondary | ICD-10-CM | POA: Diagnosis not present

## 2019-03-03 DIAGNOSIS — H5462 Unqualified visual loss, left eye, normal vision right eye: Secondary | ICD-10-CM | POA: Diagnosis not present

## 2019-03-03 DIAGNOSIS — I5030 Unspecified diastolic (congestive) heart failure: Secondary | ICD-10-CM | POA: Diagnosis not present

## 2019-03-03 DIAGNOSIS — E118 Type 2 diabetes mellitus with unspecified complications: Secondary | ICD-10-CM | POA: Diagnosis not present

## 2019-03-03 DIAGNOSIS — Z79899 Other long term (current) drug therapy: Secondary | ICD-10-CM | POA: Diagnosis not present

## 2019-03-03 DIAGNOSIS — E11319 Type 2 diabetes mellitus with unspecified diabetic retinopathy without macular edema: Secondary | ICD-10-CM

## 2019-03-03 DIAGNOSIS — I110 Hypertensive heart disease with heart failure: Secondary | ICD-10-CM | POA: Diagnosis not present

## 2019-03-03 DIAGNOSIS — I5032 Chronic diastolic (congestive) heart failure: Secondary | ICD-10-CM

## 2019-03-03 DIAGNOSIS — Z9119 Patient's noncompliance with other medical treatment and regimen: Secondary | ICD-10-CM | POA: Diagnosis not present

## 2019-03-03 MED ORDER — FUROSEMIDE 20 MG PO TABS: 20.0000 mg | ORAL_TABLET | Freq: Two times a day (BID) | ORAL | 3 refills | Status: DC | PRN

## 2019-03-03 NOTE — Progress Notes (Signed)
CC: Leg swelling  HPI: [name] is a 48 y.o. F w/ PMH of A.fib, uncontrolled diabetes, HTN, HLD, L blind eye who presents w/ 'feet heaviness.' [name] was examined and evaluated with her son present with her consent. She mentions her symptoms started couple weeks ago. Denies any pain, numbness or tingling. Mentions both feets feeling 'heavy.' Worse near end of the day. Improved with elevation. Denies any chest pain, palpitations, dyspnea, orthopnea.  Past Medical History:  Diagnosis Date  . Anemia, iron deficiency   . Atrial fibrillation ([place])   . Blindness of left eye   . Decreased visual acuity    Left eye  . 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 Hair loss   . History of fracture of clavicle 05/18/2015  . Hyperlipidemia   . Pap smear abnormality of cervix with LGSIL   . Routine/ritual circumcision   . Type II diabetes mellitus ([place])   . Uveitis     Review of Systems: Review of Systems  Constitutional: Negative for chills, fever and malaise/fatigue.  Eyes: Negative for blurred vision.  Respiratory: Negative for cough, shortness of breath and wheezing.   Cardiovascular: Positive for leg swelling. Negative for chest pain, palpitations and orthopnea.  Gastrointestinal: Negative for constipation, diarrhea, nausea and vomiting.  Neurological: Negative for dizziness, sensory change and headaches.  All other systems reviewed and are negative.   Physical Exam: Vitals:   03/03/19 1455  BP: 135/73  Pulse: 78  Temp: 98.2 F (36.8 C)  TempSrc: Oral  SpO2: 100%  Weight: 206 lb 6.4 oz (93.6 kg)  Height: 5\' 6"  (1.676 m)    Physical Exam  Constitutional: She appears well-developed and well-nourished. No distress.  HENT:  Mouth/Throat: Oropharynx is clear and moist.  Eyes: Conjunctivae are normal.  Neck: Normal range of motion. Neck  supple. No JVD present.  Cardiovascular: Normal rate, regular rhythm, normal heart sounds and intact distal pulses.  No murmur heard. Respiratory: Effort normal and breath sounds normal. She has no wheezes. She has no rales.  GI: Soft. Bowel sounds are normal. She exhibits no distension. There is no abdominal tenderness.  Musculoskeletal: Normal range of motion.        General: Edema (trace pitting ankle edema bilaterally) present.    Assessment & Plan:   (HFpEF) heart failure with preserved ejection fraction ([place]) Presents with complaint of 'heavy foot.' Mentions worse at end of day. Improved with elevation. On chart review, prior TTE in 08/2018 w/ EF 65 w/ impaired diastolic function. Med bag shows bottle of furosemide nearly full. [name] mentions not taking it regularly. Discussed importance of medication adherence and taking her home med as prescribed.  - BMP - Re-start furosemide 20mg  daily - Info on compression stockings provided  Type II diabetes mellitus ([place]) Lab Results  Component Value Date   HGBA1C >14.0 (A) 01/20/2019   Presents w/ leg pain/heaviness. Diabetic foot exam performed showing no neuropathy. Known to have severely uncontrolled diabetes. Per chart review, PCP has been working with pharmacy to perform med-rec. Mrs.Jennifer [name] assures me that she takes her insulin at home but she did not bring her insulin with her today to confirm dosing. She also did not bring her glucometer. She states that her glucometer is out of battery and she has not been monitoring her blood glucose. Stressed again the importance of medication adherence and blood glucose monitoring.  Mrs.Jennifer [name] expressed understanding.  - C/w Humulin, Metformin - F/u w/ PCP    Patient discussed with Dr. Dareen Piano   -Gilberto Better, [facility] Pager: [phone]

## 2019-03-03 NOTE — Patient Instructions (Addendum)
Dear Jennifer [name],  Thank you for allowing [place] to provide your care today. Today we discussed your feet swelling    I have ordered bmp labs for you. I will call if any are abnormal.    Today we made the following changes to your medications:    Please take furosemide 20mg  twice daily until your swelling is resolved  Please follow-up in 2 months.    Should you have any questions or concerns please call the internal medicine clinic at [phone].    Thank you for choosing [facility].   How to Use Compression Stockings Compression stockings are elastic socks that squeeze the legs. They help increase blood flow (circulation) to the legs, decrease swelling in the legs, and reduce the chance of developing blood clots in the lower legs. Compression stockings are often used by people who:  Are recovering from surgery.  Have poor circulation in their legs.  Tend to get blood clots in their legs.  Have bulging (varicose) veins.  Sit or stay in bed for long periods of time. Follow instructions from your health care provider about how and when to wear your compression stockings. How to wear compression stockings Before you put on your compression stockings:  Make sure that they are the correct size and degree of compression. If you do not know your size or required grade of compression, ask your health care provider and follow the manufacturer's instructions that come with the stockings.  Make sure that they are clean, dry, and in good condition.  Check them for rips and tears. Do not put them on if they are ripped or torn. Put your stockings on first thing in the morning, before you get out of bed. Keep them on for as long as your health care provider advises. When you are wearing your stockings:  Keep them as smooth as possible. Do not allow them to bunch up. It is especially important to prevent the stockings from bunching up around your toes or behind your knees.  Do not roll  the stockings downward and leave them rolled down. This can decrease blood flow to your leg.  Change them right away if they become wet or dirty. When you take off your stockings, inspect your legs and feet. Check for:  Open sores.  Red spots.  Swelling. General tips  Do not stop wearing compression stockings without talking to your health care provider first.  Wash your stockings every day with mild detergent in cold or warm water. Do not use bleach. Air-dry your stockings or dry them in a clothes dryer on low heat. It may be helpful to have two pairs so that you have a pair to wear while the other is being washed.  Replace your stockings every 3-6 months.  If skin moisturizing is part of your treatment plan, apply lotion or cream at night so that your skin will be dry when you put on the stockings in the morning. It is harder to put the stockings on when you have lotion on your legs or feet.  Wear nonskid shoes or slip-resistant socks when walking while wearing compression stockings. Contact a health care provider and remove your stockings if you have:  A feeling of pins and needles in your feet or legs.  Open sores, red spots, or other skin changes on your feet or legs.  Swelling or pain that gets worse. Get help right away if you have:  Numbness or tingling in your lower legs that does  not get better right after you take the stockings off.  Toes or feet that are unusually cold or turn a bluish color.  A warm or red area on your leg.  New swelling or soreness in your leg.  Shortness of breath.  Chest pain.  A fast or irregular heartbeat.  Light-headedness.  Dizziness. Summary  Compression stockings are elastic socks that squeeze the legs.  They help increase blood flow (circulation) to the legs, decrease swelling in the legs, and reduce the chance of developing blood clots in the lower legs.  Follow instructions from your health care provider about how and when  to wear your compression stockings.  Do not stop wearing your compression stockings without talking to your health care provider first. This information is not intended to replace advice given to you by your health care provider. Make sure you discuss any questions you have with your health care provider. Document Released: 02/03/2009 Document Revised: 04/10/2017 Document Reviewed: 04/10/2017 Elsevier Patient Education  2020 [employer].

## 2019-03-04 ENCOUNTER — Encounter: Payer: Self-pay | Admitting: Internal Medicine

## 2019-03-04 ENCOUNTER — Telehealth: Payer: Self-pay | Admitting: Internal Medicine

## 2019-03-04 LAB — BMP8+ANION GAP
Anion Gap: 14 mmol/L (ref 10.0–18.0)
BUN/Creatinine Ratio: 26 — ABNORMAL HIGH (ref 9–23)
BUN: 23 mg/dL (ref 6–24)
CO2: 23 mmol/L (ref 20–29)
Calcium: 9.4 mg/dL (ref 8.7–10.2)
Chloride: 98 mmol/L (ref 96–106)
Creatinine, Ser: 0.9 mg/dL (ref 0.57–1.00)
GFR calc Af Amer: 88 mL/min/{1.73_m2} (ref >59)
GFR calc non Af Amer: 76 mL/min/{1.73_m2} (ref >59)
Glucose: 200 mg/dL — ABNORMAL HIGH (ref 65–99)
Potassium: 4.6 mmol/L (ref 3.5–5.2)
Sodium: 135 mmol/L (ref 134–144)

## 2019-03-04 NOTE — Assessment & Plan Note (Addendum)
Presents with complaint of 'heavy foot.' Mentions worse at end of day. Improved with elevation. On chart review, prior TTE in 08/2018 w/ EF 65 w/ impaired diastolic function. Med bag shows bottle of furosemide nearly full. Jennifer [name] mentions not taking it regularly. Discussed importance of medication adherence and taking her home med as prescribed.  - BMP - Re-start furosemide 20mg  daily - Info on compression stockings provided

## 2019-03-04 NOTE — Assessment & Plan Note (Signed)
Lab Results  Component Value Date   HGBA1C >14.0 (A) 01/20/2019   Presents w/ leg pain/heaviness. Diabetic foot exam performed showing no neuropathy. Known to have severely uncontrolled diabetes. Per chart review, PCP has been working with pharmacy to perform med-rec. Mrs.Bickham assures me that she takes her insulin at home but she did not bring her insulin with her today to confirm dosing. She also did not bring her glucometer. She states that her glucometer is out of battery and she has not been monitoring her blood glucose. Stressed again the importance of medication adherence and blood glucose monitoring. Mrs.Cardoza expressed understanding.  - C/w Humulin, Metformin - F/u w/ PCP

## 2019-03-04 NOTE — Telephone Encounter (Signed)
Spoke with Mrs.Honeyman's son, Adilynn Salb, regarding Bmp results. With renal fx at baseline and K WNL, instructed to start furosemide daily. Mr.Wexler expressed understanding.

## 2019-03-08 NOTE — Progress Notes (Signed)
Internal Medicine Clinic Attending ° °Case discussed with Dr. Lee at the time of the visit.  We reviewed the resident’s history and exam and pertinent patient test results.  I agree with the assessment, diagnosis, and plan of care documented in the resident’s note.  °

## 2019-03-11 ENCOUNTER — Other Ambulatory Visit: Payer: Self-pay | Admitting: Internal Medicine

## 2019-03-11 DIAGNOSIS — R609 Edema, unspecified: Secondary | ICD-10-CM

## 2019-03-11 NOTE — Telephone Encounter (Signed)
Hi Dr. Truman Hayward, We received a refill request from pt's pharmacy.  Please clarify RX: Should quantity be #60?  If so, pt is needing a new RX sent in to avoid paying 2 co-pay's per month.  Please fill as appropriate. Thank you! Higinio Roger, RN,BSN

## 2019-04-28 ENCOUNTER — Telehealth: Payer: Self-pay | Admitting: *Deleted

## 2019-04-28 NOTE — Telephone Encounter (Signed)
Call to [facility] that patient no longer wants to have a CGM per Dr. Charleen Kirks.  Sander Nephew, RN 04/28/2019 3:16 PM.

## 2019-05-04 ENCOUNTER — Encounter: Payer: Self-pay | Admitting: Dietician

## 2019-05-05 ENCOUNTER — Encounter: Payer: Self-pay | Admitting: Internal Medicine

## 2019-05-05 ENCOUNTER — Encounter: Payer: Medicaid Other | Admitting: Internal Medicine

## 2019-05-15 ENCOUNTER — Other Ambulatory Visit: Payer: Self-pay

## 2019-05-15 ENCOUNTER — Encounter (HOSPITAL_COMMUNITY): Payer: Self-pay | Admitting: *Deleted

## 2019-05-15 ENCOUNTER — Ambulatory Visit (HOSPITAL_COMMUNITY)
Admission: EM | Admit: 2019-05-15 | Discharge: 2019-05-15 | Disposition: A | Discharge status: Home / Self Care | Payer: Medicaid Other | Attending: Internal Medicine | Admitting: Internal Medicine

## 2019-05-15 DIAGNOSIS — H5462 Unqualified visual loss, left eye, normal vision right eye: Secondary | ICD-10-CM | POA: Insufficient documentation

## 2019-05-15 DIAGNOSIS — Z794 Long term (current) use of insulin: Secondary | ICD-10-CM | POA: Diagnosis not present

## 2019-05-15 DIAGNOSIS — I10 Essential (primary) hypertension: Secondary | ICD-10-CM | POA: Diagnosis not present

## 2019-05-15 DIAGNOSIS — E785 Hyperlipidemia, unspecified: Secondary | ICD-10-CM | POA: Diagnosis not present

## 2019-05-15 DIAGNOSIS — Z79899 Other long term (current) drug therapy: Secondary | ICD-10-CM | POA: Diagnosis not present

## 2019-05-15 DIAGNOSIS — E559 Vitamin D deficiency, unspecified: Secondary | ICD-10-CM | POA: Diagnosis not present

## 2019-05-15 DIAGNOSIS — R519 Headache, unspecified: Secondary | ICD-10-CM | POA: Diagnosis present

## 2019-05-15 DIAGNOSIS — G43009 Migraine without aura, not intractable, without status migrainosus: Secondary | ICD-10-CM | POA: Diagnosis not present

## 2019-05-15 DIAGNOSIS — E119 Type 2 diabetes mellitus without complications: Secondary | ICD-10-CM | POA: Diagnosis not present

## 2019-05-15 DIAGNOSIS — I4891 Unspecified atrial fibrillation: Secondary | ICD-10-CM | POA: Diagnosis not present

## 2019-05-15 DIAGNOSIS — Z833 Family history of diabetes mellitus: Secondary | ICD-10-CM | POA: Insufficient documentation

## 2019-05-15 DIAGNOSIS — Z7901 Long term (current) use of anticoagulants: Secondary | ICD-10-CM | POA: Insufficient documentation

## 2019-05-15 DIAGNOSIS — Z20822 Contact with and (suspected) exposure to covid-19: Secondary | ICD-10-CM | POA: Diagnosis not present

## 2019-05-15 DIAGNOSIS — A084 Viral intestinal infection, unspecified: Secondary | ICD-10-CM

## 2019-05-15 MED ORDER — SUMATRIPTAN SUCCINATE 50 MG PO TABS: 50.0000 mg | ORAL_TABLET | Freq: Once | ORAL | 0 refills | Status: DC

## 2019-05-15 MED ORDER — ONDANSETRON 4 MG PO TBDP: 4.0000 mg | ORAL_TABLET | Freq: Three times a day (TID) | ORAL | 0 refills | Status: DC | PRN

## 2019-05-15 NOTE — ED Provider Notes (Signed)
[facility]    CSN: [id number] Arrival date & time: 05/15/19  1728      History   Chief Complaint Chief Complaint  Patient presents with  . Abdominal Pain  . Emesis    HPI Jennifer [name] is a 49 y.o. female with a history of atrial fibrillation-rate controlled, diabetes mellitus type 2-on insulin comes to urgent care with complaints of headache, abdominal pain and emesis of 1 day duration.  Patient describes pain as retro-orbital on the right side, severe and constant.  Headache is aggravated by bright light.  She has not tried any over-the-counter medication.  It was associated with some emesis following nausea.  She also has abdominal pain associated with the vomiting.  Vomitus is nonbloody/nonbilious.  No fever or chills.  No generalized body aches.  No sick contact exposures.  No change in diet. HPI  Past Medical History:  Diagnosis Date  . Anemia, iron deficiency   . Atrial fibrillation ([place])   . Blindness of left eye   . Decreased visual acuity    Left eye  . 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 Hair loss   . History of fracture of clavicle 05/18/2015  . Hyperlipidemia   . Iron deficiency anemia 05/13/2013  . Pain, dental 08/19/2018   Tooth pain/facial swelling: has poor dentition at baseline, history of dental abscess.  She has not seen a dentist in about one year.  Dentist is on [address].  No fevers chills or systemic symptoms.  Diabetes has been poorly controlled for some time.  She said it has improved recently averaging around 140.  Called the dentist said they would not see patients until May 14th but the urgency o  . Pap smear abnormality of cervix with LGSIL   . Routine/ritual circumcision   . Type II diabetes mellitus ([place])   . Uveitis     Patient Active Problem List   Diagnosis Date Noted  . Atrial  fibrillation ([place]) 09/20/2018  . Migraine   . Hematuria 08/24/2018  . Vulvovaginitis 05/21/2018  . (HFpEF) heart failure with preserved ejection fraction ([place]) 01/01/2018  . Left anterior shoulder pain 07/06/2017  . Hypertension associated with diabetes ([place]) 07/23/2016  . 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 Fatigue 04/02/2016  . 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 Vitamin D deficiency 10/30/2014  . Hair loss 03/24/2012  . Healthcare maintenance 02/26/2011  . Hyperlipidemia 05/05/2006  . Type II diabetes mellitus ([place]) 04/22/1998    Past Surgical History:  Procedure Laterality Date  . CESAREAN SECTION      OB History    Gravida  7   Para  4   Term  4   Preterm      AB  3   Living  4     SAB  3   TAB      Ectopic      Multiple      Live Births               Home Medications    Prior to Admission medications   Medication Sig Start Date End Date Taking? Authorizing Provider  acetaminophen (TYLENOL) 500 MG tablet Take 1,000 mg by mouth every 6 (six) hours as needed for pain.   Yes [provider]  apixaban (ELIQUIS) 5 MG TABS tablet Take 1  tablet (5 mg total) by mouth 2 (two) times daily. 01/20/19  Yes Jose Persia, MD  Accu-Chek FastClix Lancets MISC Check blood sugar 4 times a day 01/20/19   Jose Persia, MD  atorvastatin (LIPITOR) 40 MG tablet Take 1 tablet (40 mg total) by mouth daily. 01/20/19   Jose Persia, MD  Blood Glucose Monitoring Suppl (ACCU-CHEK GUIDE) w/Device KIT 1 each by Does not apply route 4 (four) times daily. 11/19/18   Jose Persia, MD  brimonidine (ALPHAGAN) 0.2 % ophthalmic solution Place 1 drop into the left eye 3 (three) times daily.     [provider]  diltiazem (CARDIZEM SR) 60 MG 12 hr capsule Take 1 capsule (60 mg total) by mouth 2 (two) times daily. 01/20/19   Jose Persia, MD  dorzolamide-timolol (COSOPT) 22.3-6.8 MG/ML ophthalmic solution Place 1 drop into the  left eye 2 (two) times daily.    [provider]  DULoxetine (CYMBALTA) 60 MG capsule Take 1 capsule (60 mg total) by mouth daily. 05/20/18 05/20/19  Alphonzo Grieve, MD  furosemide (LASIX) 20 MG tablet TAKE 1 TABLET (20 MG TOTAL) BY MOUTH 2 (TWO) TIMES DAILY AS NEEDED FOR EDEMA. 03/11/19   Mosetta Anis, MD  glucose blood (ACCU-CHEK GUIDE) test strip Check blood sugar 4 times per day 11/19/18   Jose Persia, MD  glucose blood (ACCU-CHEK SMARTVIEW) test strip CHECK BLOOD SUGAR 4 TIMES DAILY BEFORE MEALS AND AT BEDTIME 05/20/18   Alphonzo Grieve, MD  ibuprofen (ADVIL,MOTRIN) 800 MG tablet Take 800 mg by mouth 4 (four) times daily as needed (pain).     [provider]  Insulin NPH, Human,, Isophane, (HUMULIN N) 100 UNIT/ML Kiwkpen INJECT 40 UNITS SUBCUTANEOUSLY EVERY MORNING AND 40 UNITS EVERY EVENING. Diag code E11.9 01/20/19   Jose Persia, MD  Insulin Pen Needle 31G X 5 MM MISC 1 Dose by Does not apply route 2 (two) times daily. 05/20/18   Alphonzo Grieve, MD  ketorolac (ACULAR) 0.5 % ophthalmic solution Place 1 drop into both eyes 4 (four) times daily.    [provider]  liraglutide (VICTOZA) 18 MG/3ML SOPN Inject 0.1 mLs (0.6 mg total) into the skin daily. After 1 week increase to 0.2m (1.286mtotal) into skin daily Patient taking differently: Inject 1.2 mg into the skin daily.  05/20/18   SvAlphonzo GrieveMD  lisinopril (ZESTRIL) 10 MG tablet Take 1 tablet (10 mg total) by mouth daily. 01/20/19   BaJose PersiaMD  metFORMIN (GLUCOPHAGE) 1000 MG tablet TAKE 1 TABLET (1,000 MG TOTAL) BY MOUTH 2 (TWO) TIMES DAILY WITH A MEAL. 06/11/18   SvAlphonzo GrieveMD  naproxen sodium (ALEVE) 220 MG tablet Take 220 mg by mouth daily as needed (pain).    [provider]  ondansetron (ZOFRAN ODT) 4 MG disintegrating tablet Take 1 tablet (4 mg total) by mouth every 8 (eight) hours as needed for nausea or vomiting. 05/15/19   Mathhew Buysse, PhMyrene GalasMD  SUMAtriptan (IMITREX) 50 MG  tablet Take 1 tablet (50 mg total) by mouth once for 1 dose. May repeat in 2 hours if headache persists or recurs. 05/15/19 05/15/19  LaChase PicketMD  ferrous sulfate 325 (65 FE) MG tablet Take 1 tablet (325 mg total) by mouth daily. Patient not taking: Reported on 06/12/2018 04/29/17 05/15/19  SvAlphonzo GrieveMD    Family History Family History  Problem Relation Age of Onset  . Diabetes Father     Social History Social History   Tobacco Use  . Smoking  status: Never Smoker  . Smokeless tobacco: Never Used  Substance Use Topics  . Alcohol use: No  . Drug use: No     Allergies   Patient has no known allergies.   Review of Systems Review of Systems  Constitutional: Negative for activity change, chills, fatigue and fever.  HENT: Negative.  Negative for hearing loss, mouth sores, rhinorrhea and sore throat.   Eyes: Negative for photophobia, pain and visual disturbance.  Respiratory: Negative for cough, chest tightness and shortness of breath.   Cardiovascular: Negative for chest pain.  Gastrointestinal: Negative for diarrhea, nausea and vomiting.  Genitourinary: Negative.   Musculoskeletal: Negative.  Negative for arthralgias and myalgias.  Skin: Negative.   Neurological: Positive for dizziness and headaches. Negative for weakness, light-headedness and numbness.  Psychiatric/Behavioral: Negative for confusion, decreased concentration and sleep disturbance.     Physical Exam Triage Vital Signs ED Triage Vitals  Enc Vitals Group     BP 05/15/19 1859 (!) 141/75     Pulse Rate 05/15/19 1859 80     Resp 05/15/19 1859 16     Temp 05/15/19 1859 97.8 F (36.6 C)     Temp src --      SpO2 05/15/19 1859 100 %     Weight --      Height --      Head Circumference --      Peak Flow --      Pain Score 05/15/19 1902 6     Pain Loc --      Pain Edu? --      Excl. in [place]? --    No data found.  Updated Vital Signs BP (!) 141/75   Pulse 80   Temp 97.8 F (36.6 C)   Resp  16   SpO2 100%   Visual Acuity Right Eye Distance:   Left Eye Distance:   Bilateral Distance:    Right Eye Near:   Left Eye Near:    Bilateral Near:     Physical Exam Vitals and nursing note reviewed.  Constitutional:      General: She is not in acute distress.    Appearance: She is not ill-appearing.  Cardiovascular:     Rate and Rhythm: Normal rate and regular rhythm.     Heart sounds: Normal heart sounds. No murmur.  Pulmonary:     Effort: Pulmonary effort is normal. No respiratory distress.     Breath sounds: No wheezing.  Abdominal:     General: Abdomen is flat. There is no distension or abdominal bruit.     Palpations: Abdomen is rigid. There is no shifting dullness, fluid wave or hepatomegaly.     Tenderness: There is no abdominal tenderness. There is no right CVA tenderness or left CVA tenderness.     Hernia: No hernia is present.  Skin:    General: Skin is warm.     Capillary Refill: Capillary refill takes less than 2 seconds.     Coloration: Skin is not mottled or pale.  Neurological:     General: No focal deficit present.     Mental Status: She is alert and oriented to person, place, and time.     Cranial Nerves: No cranial nerve deficit.     Motor: No weakness.  Psychiatric:        Mood and Affect: Mood normal.        Behavior: Behavior normal.      UC Treatments / Results  Labs (all labs ordered are  listed, but only abnormal results are displayed) Labs Reviewed  NOVEL CORONAVIRUS, NAA (HOSP ORDER, SEND-OUT TO REF LAB; TAT 18-24 HRS)    EKG   Radiology No results found.  Procedures Procedures (including critical care time)  Medications Ordered in UC Medications - No data to display  Initial Impression / Assessment and Plan / UC Course  I have reviewed the triage vital signs and the nursing notes.  Pertinent labs & imaging results that were available during my care of the patient were reviewed by me and considered in my medical decision  making (see chart for details).     1.  Migraine without aura: Imitrex 50 mg once may be repeated in 2 hours if no improvement in headaches Zofran as needed for nausea/vomiting No indication for imaging at this time Patient has no neurologic signs COVID-19 PCR test sent Patient is advised to self isolate until COVID-19 test results are available.  If COVID-19 test is positive patient will be advised to self isolate for 10 days per CDC criteria.   Final Clinical Impressions(s) / UC Diagnoses   Final diagnoses:  Migraine without aura and without status migrainosus, not intractable  Viral gastroenteritis   Discharge Instructions   None    ED Prescriptions    Medication Sig Dispense Auth. Provider   SUMAtriptan (IMITREX) 50 MG tablet Take 1 tablet (50 mg total) by mouth once for 1 dose. May repeat in 2 hours if headache persists or recurs. 15 tablet Syrai Gladwin, Myrene Galas, MD   ondansetron (ZOFRAN ODT) 4 MG disintegrating tablet Take 1 tablet (4 mg total) by mouth every 8 (eight) hours as needed for nausea or vomiting. 20 tablet Porchia Sinkler, Myrene Galas, MD     PDMP not reviewed this encounter.   Chase Picket, MD 05/16/19 1343

## 2019-05-15 NOTE — ED Triage Notes (Signed)
Started with HA this afternoon @ 1500, then started with vomiting, diarrhea, abd pain.  Per family, pt is able to keep down some PO fluids.  Denies fevers.

## 2019-05-17 ENCOUNTER — Telehealth: Payer: Self-pay | Admitting: *Deleted

## 2019-05-17 LAB — NOVEL CORONAVIRUS, NAA (HOSP ORDER, SEND-OUT TO REF LAB; TAT 18-24 HRS): SARS-CoV-2, NAA: NOT DETECTED

## 2019-05-17 NOTE — Telephone Encounter (Signed)
Patient called given negative covid results . 

## 2019-06-07 ENCOUNTER — Other Ambulatory Visit: Payer: Self-pay

## 2019-06-07 ENCOUNTER — Ambulatory Visit: Payer: Medicaid Other | Admitting: Internal Medicine

## 2019-06-07 VITALS — BP 117/72 | HR 67 | Temp 98.0°F | Ht 66.0 in | Wt 193.9 lb

## 2019-06-07 DIAGNOSIS — Z794 Long term (current) use of insulin: Secondary | ICD-10-CM | POA: Diagnosis not present

## 2019-06-07 DIAGNOSIS — G43919 Migraine, unspecified, intractable, without status migrainosus: Secondary | ICD-10-CM

## 2019-06-07 DIAGNOSIS — Z79899 Other long term (current) drug therapy: Secondary | ICD-10-CM

## 2019-06-07 DIAGNOSIS — H409 Unspecified glaucoma: Secondary | ICD-10-CM

## 2019-06-07 DIAGNOSIS — G43909 Migraine, unspecified, not intractable, without status migrainosus: Secondary | ICD-10-CM | POA: Diagnosis not present

## 2019-06-07 DIAGNOSIS — E785 Hyperlipidemia, unspecified: Secondary | ICD-10-CM

## 2019-06-07 DIAGNOSIS — R600 Localized edema: Secondary | ICD-10-CM | POA: Diagnosis present

## 2019-06-07 DIAGNOSIS — E559 Vitamin D deficiency, unspecified: Secondary | ICD-10-CM

## 2019-06-07 DIAGNOSIS — E1139 Type 2 diabetes mellitus with other diabetic ophthalmic complication: Secondary | ICD-10-CM | POA: Diagnosis not present

## 2019-06-07 DIAGNOSIS — I4891 Unspecified atrial fibrillation: Secondary | ICD-10-CM

## 2019-06-07 DIAGNOSIS — E782 Mixed hyperlipidemia: Secondary | ICD-10-CM

## 2019-06-07 DIAGNOSIS — E11319 Type 2 diabetes mellitus with unspecified diabetic retinopathy without macular edema: Secondary | ICD-10-CM

## 2019-06-07 DIAGNOSIS — I10 Essential (primary) hypertension: Secondary | ICD-10-CM | POA: Diagnosis not present

## 2019-06-07 DIAGNOSIS — H42 Glaucoma in diseases classified elsewhere: Secondary | ICD-10-CM

## 2019-06-07 DIAGNOSIS — R609 Edema, unspecified: Secondary | ICD-10-CM

## 2019-06-07 LAB — POCT GLYCOSYLATED HEMOGLOBIN (HGB A1C): HbA1c POC (<> result, manual entry): >14 % — AB (ref 4.0–5.6)

## 2019-06-07 LAB — GLUCOSE, CAPILLARY: Glucose-Capillary: 214 mg/dL — ABNORMAL HIGH (ref 70–99)

## 2019-06-07 MED ORDER — ASPIRIN EC 81 MG PO TBEC: 81.0000 mg | DELAYED_RELEASE_TABLET | Freq: Every day | ORAL | 3 refills | Status: DC

## 2019-06-07 MED ORDER — FUROSEMIDE 20 MG PO TABS: 20.0000 mg | ORAL_TABLET | Freq: Every day | ORAL | 3 refills | Status: DC | PRN

## 2019-06-07 MED ORDER — ACCU-CHEK FASTCLIX LANCETS MISC: 7 refills | Status: DC

## 2019-06-07 MED ORDER — ACCU-CHEK GUIDE VI STRP: ORAL_STRIP | 12 refills | Status: DC

## 2019-06-07 MED ORDER — ATORVASTATIN CALCIUM 40 MG PO TABS: 40.0000 mg | ORAL_TABLET | Freq: Every day | ORAL | 3 refills | Status: DC

## 2019-06-07 MED ORDER — INSULIN ISOPHANE HUMAN 100 UNIT/ML KWIKPEN: PEN_INJECTOR | SUBCUTANEOUS | 3 refills | Status: DC

## 2019-06-07 NOTE — Assessment & Plan Note (Signed)
Patient was recently seen at a urgent care and diagnosed with migraine that was relieved with sumatriptan.  [name] reports that in the past 1 month, she has had 2 such episodes that was additionally relieved with the medication provided.  Encouraged to keep track of how many migraines she has per month to determine if she may ever need prophylactic treatment.

## 2019-06-07 NOTE — Assessment & Plan Note (Signed)
Mrs. Slacum presents today with bilateral leg heaviness that she states has been ongoing for 4 months.  She has previously been seen in the clinic for this, and was restarted on Lasix.  Patient states this alleviated her symptoms but she has run out.  She also endorses pins-and-needles sensation in her bilateral feet that is worsened with exertion and relieved with rest.  On examination, patient's bilateral legs are noted to have very trace pitting edema.  More concerning though, both her feet were extremely cold.  DP and PT were unable to be palpated, and could only be detected with Doppler.  ABIs were obtained for further evaluation and showed borderline PAD bilaterally.  Differential diagnosis for leg heaviness includes HFpEF versus dependent edema versus arterial disease.  Will initiate on treatment for PAD prevention and refill Lasix.  Plan: -Refilled Lasix 20 mg daily as needed -Aspirin 81 mg daily -Atorvastatin 40 mg daily -Lipid panel pending

## 2019-06-07 NOTE — Progress Notes (Signed)
   CC: Leg heaviness  HPI:  Jennifer [name] is a 49 y.o. with a PMHx of T2DM, HTN, glaucoma, A. fib who presents to the clinic for leg heaviness.   Please see the Encounters tab for problem-based Assessment & Plan regarding status of patient's chronic conditions.  Past Medical History:  Diagnosis Date  . Anemia, iron deficiency   . Atrial fibrillation ([place])   . Blindness of left eye   . Decreased visual acuity    Left eye  . 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 Hair loss   . History of fracture of clavicle 05/18/2015  . Hyperlipidemia   . Iron deficiency anemia 05/13/2013  . Pain, dental 08/19/2018   Tooth pain/facial swelling: has poor dentition at baseline, history of dental abscess.  She has not seen a dentist in about one year.  Dentist is on [address].  No fevers chills or systemic symptoms.  Diabetes has been poorly controlled for some time.  She said it has improved recently averaging around 140.  Called the dentist said they would not see patients until May 14th but the urgency o  . Pap smear abnormality of cervix with LGSIL   . Routine/ritual circumcision   . Type II diabetes mellitus ([place])   . Uveitis    Review of Systems: Review of Systems  Constitutional: Negative for chills and fever.  Cardiovascular: Positive for leg swelling.  Musculoskeletal: Positive for myalgias. Negative for falls and joint pain.  Neurological: Negative for weakness.       Tingling in bilateral legs   Physical Exam:  Vitals:   06/07/19 1445  BP: 117/72  Pulse: 67  Temp: 98 F (36.7 C)  TempSrc: Oral  SpO2: 100%  Weight: 193 lb 14.4 oz (88 kg)  Height: 5\' 6"  (1.676 m)   Physical Exam Vitals and nursing note reviewed.  Constitutional:      General: She is not in acute distress.    Appearance: She is normal weight.  Cardiovascular:   Pulses: Decreased pulses.          Dorsalis pedis pulses are detected w/ Doppler on the right side and detected w/ Doppler on the left side.  Pulmonary:     Effort: Pulmonary effort is normal. No respiratory distress.  Musculoskeletal:        General: No tenderness or signs of injury.     Right lower leg: Edema (Trace pitting up to mid shin) present.     Left lower leg: Edema (Trace pitting up to mid shin) present.  Skin:    General: Skin is dry.     Comments: Bilateral feet are very cold to touch compared to upper calf  Neurological:     General: No focal deficit present.     Mental Status: She is alert and oriented to person, place, and time. Mental status is at baseline.  Psychiatric:        Mood and Affect: Mood normal.        Behavior: Behavior normal.    Assessment & Plan:   See Encounters Tab for problem based charting.  Patient discussed with Dr. Lynnae January

## 2019-06-07 NOTE — Assessment & Plan Note (Signed)
Patient is prescribed atorvastatin 40 mg daily, however refill history shows she has not refilled this prescription since September, so uncertain if she is taking truly daily.  Today, given findings of borderline PAD, I strongly encourage patient to restart taking this daily and provided refills.  Plan: -Refilled atorvastatin 40 mg -Lipid panel pending

## 2019-06-07 NOTE — Patient Instructions (Addendum)
It was nice seeing you today! Thank you for choosing [facility] for your Primary Care.    Today we talked about:   1. Leg Heaviness: Please continued to take Lasix (Furosemide) as needed for leg swelling. In addition, please take Aspirin and Atorvastatin daily to avoid further worsening.   2. Please make a follow up visit as soon as you are available, at which you can bring all of your medication bottles   -----------------------------------------------------------------------------    !   [facility]  .     :   :     Lasix ()    .             Marland Kitchen

## 2019-06-07 NOTE — Assessment & Plan Note (Signed)
Patient brought her vitamin D3 bottle in today to show that she is taking this daily.  Levels have not been checked 2017 when they were 16.9.  Plan to check levels today to determine necessity of continued supplementation.  Plan: - Vitamin D level pending

## 2019-06-07 NOTE — Assessment & Plan Note (Signed)
We briefly discussed patient's T2DM prior to departure today. She endorses using insulin daily, unknown how many times she checks her BG.   A1c was rechecked today and continues to be over 14.0. This is extremely concerning as patient is at high risk of complications beyond the ones that she already has including retinopathy.  I encouraged the patient to please make a follow-up visit with me in which she brings all of her medications with her so we may do a reconciliation.  In the past there is been quite a bit of confusion on what medications she is taking.    Plan: -Refilled Humulin, test strips, lancets -Follow-up at next available appointment for medicine reconciliation

## 2019-06-08 LAB — LIPID PANEL
Chol/HDL Ratio: 4 ratio (ref 0.0–4.4)
Cholesterol, Total: 222 mg/dL — ABNORMAL HIGH (ref 100–199)
HDL: 56 mg/dL (ref >39)
LDL Chol Calc (NIH): 144 mg/dL — ABNORMAL HIGH (ref 0–99)
Triglycerides: 126 mg/dL (ref 0–149)
VLDL Cholesterol Cal: 22 mg/dL (ref 5–40)

## 2019-06-08 LAB — VITAMIN D 25 HYDROXY (VIT D DEFICIENCY, FRACTURES): Vit D, 25-Hydroxy: 23.6 ng/mL — ABNORMAL LOW (ref 30.0–100.0)

## 2019-06-09 NOTE — Progress Notes (Signed)
Internal Medicine Clinic Attending  Case discussed with Dr. Basaraba at the time of the visit.  We reviewed the resident's history and exam and pertinent patient test results.  I agree with the assessment, diagnosis, and plan of care documented in the resident's note.    

## 2019-06-11 ENCOUNTER — Other Ambulatory Visit: Payer: Self-pay | Admitting: Internal Medicine

## 2019-06-11 DIAGNOSIS — E11319 Type 2 diabetes mellitus with unspecified diabetic retinopathy without macular edema: Secondary | ICD-10-CM

## 2019-06-30 ENCOUNTER — Ambulatory Visit: Payer: Medicaid Other | Admitting: Internal Medicine

## 2019-06-30 ENCOUNTER — Encounter: Payer: Self-pay | Admitting: Internal Medicine

## 2019-06-30 ENCOUNTER — Other Ambulatory Visit: Payer: Self-pay

## 2019-06-30 DIAGNOSIS — Z7901 Long term (current) use of anticoagulants: Secondary | ICD-10-CM

## 2019-06-30 DIAGNOSIS — Z794 Long term (current) use of insulin: Secondary | ICD-10-CM

## 2019-06-30 DIAGNOSIS — E782 Mixed hyperlipidemia: Secondary | ICD-10-CM

## 2019-06-30 DIAGNOSIS — I4891 Unspecified atrial fibrillation: Secondary | ICD-10-CM

## 2019-06-30 DIAGNOSIS — E559 Vitamin D deficiency, unspecified: Secondary | ICD-10-CM

## 2019-06-30 DIAGNOSIS — E11319 Type 2 diabetes mellitus with unspecified diabetic retinopathy without macular edema: Secondary | ICD-10-CM

## 2019-06-30 DIAGNOSIS — E785 Hyperlipidemia, unspecified: Secondary | ICD-10-CM

## 2019-06-30 DIAGNOSIS — E1159 Type 2 diabetes mellitus with other circulatory complications: Secondary | ICD-10-CM | POA: Diagnosis not present

## 2019-06-30 DIAGNOSIS — I480 Paroxysmal atrial fibrillation: Secondary | ICD-10-CM

## 2019-06-30 DIAGNOSIS — Z9114 Patient's other noncompliance with medication regimen: Secondary | ICD-10-CM

## 2019-06-30 DIAGNOSIS — Z79899 Other long term (current) drug therapy: Secondary | ICD-10-CM | POA: Diagnosis not present

## 2019-06-30 DIAGNOSIS — I10 Essential (primary) hypertension: Secondary | ICD-10-CM

## 2019-06-30 DIAGNOSIS — R600 Localized edema: Secondary | ICD-10-CM

## 2019-06-30 DIAGNOSIS — I152 Hypertension secondary to endocrine disorders: Secondary | ICD-10-CM

## 2019-06-30 MED ORDER — ASPIRIN EC 81 MG PO TBEC: 81.0000 mg | DELAYED_RELEASE_TABLET | Freq: Every day | ORAL | 3 refills | Status: DC

## 2019-06-30 MED ORDER — DULOXETINE HCL 60 MG PO CPEP: 60.0000 mg | ORAL_CAPSULE | Freq: Every day | ORAL | 1 refills | Status: DC

## 2019-06-30 MED ORDER — LIRAGLUTIDE 18 MG/3ML ~~LOC~~ SOPN: 1.2000 mg | PEN_INJECTOR | Freq: Every day | SUBCUTANEOUS | 3 refills | Status: DC

## 2019-06-30 MED ORDER — SUMATRIPTAN SUCCINATE 50 MG PO TABS: 50.0000 mg | ORAL_TABLET | Freq: Once | ORAL | 0 refills | Status: DC

## 2019-06-30 MED ORDER — ATORVASTATIN CALCIUM 40 MG PO TABS: 40.0000 mg | ORAL_TABLET | Freq: Every day | ORAL | 3 refills | Status: DC

## 2019-06-30 MED ORDER — APIXABAN 5 MG PO TABS: 5.0000 mg | ORAL_TABLET | Freq: Two times a day (BID) | ORAL | 1 refills | Status: DC

## 2019-06-30 MED ORDER — LISINOPRIL 10 MG PO TABS: 10.0000 mg | ORAL_TABLET | Freq: Every day | ORAL | 3 refills | Status: DC

## 2019-07-01 NOTE — Assessment & Plan Note (Signed)
Patient has a past medical history of A. fib with RVR that occurred back in September 2020.  At the time, no trigger could be identified.  She was discharged on apixaban and diltiazem and recommendation to follow-up with cardiology.  She followed up in the office shortly after that and brought these prescriptions with her.  At the time the prescription bottles were full, and patient denied taking the medications.  She had missed her cardiology appointment was planning to reschedule.  On follow-up today, she denies rescheduling cardiology appointment.  She endorses that she continue not taking apixaban or diltiazem.  On physical exam, she is in sinus rhythm at this time.  CHA2DS2-VASc score was elevated and she would benefit from continued apixaban but will not need rate control at this time.  I discussed with her to discontinue aspirin and start taking apixaban instead.  She expressed understanding.  Plan: -Refilled apixaban -Discontinue aspirin -Discontinue diltiazem

## 2019-07-01 NOTE — Assessment & Plan Note (Signed)
BP Readings from Last 3 Encounters:  06/30/19 118/88  06/07/19 117/72  05/15/19 (!) 141/75   Current medications listed include lisinopril 10 mg.  Patient states she is taking this and she did bring this prescription in with her today.  She denies any chest pain, dizziness, headache at this time.  She is tolerating the medication well.  Plan: -Lisinopril 10 mg daily refilled

## 2019-07-01 NOTE — Progress Notes (Addendum)
   CC: Medication reconciliation  HPI:  [name] is a 49 y.o. with a PMHx of uncontrolled type 2 diabetes, hypertension, A. fib who presents to the clinic for medication reconciliation.   The goal for visit today was to reconcile patient's medications.  She has a long history of medication noncompliance likely due to lack of understanding of medications.  Please see the Encounters tab for problem-based Assessment & Plan regarding status of patient's chronic conditions.  Past Medical History:  Diagnosis Date  . Anemia, iron deficiency   . Atrial fibrillation ([place])   . Blindness of left eye   . Decreased visual acuity    Left eye  . 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 Hair loss   . History of fracture of clavicle 05/18/2015  . Hyperlipidemia   . Iron deficiency anemia 05/13/2013  . Pain, dental 08/19/2018   Tooth pain/facial swelling: has poor dentition at baseline, history of dental abscess.  She has not seen a dentist in about one year.  Dentist is on [address].  No fevers chills or systemic symptoms.  Diabetes has been poorly controlled for some time.  She said it has improved recently averaging around 140.  Called the dentist said they would not see patients until May 14th but the urgency o  . Pap smear abnormality of cervix with LGSIL   . Routine/ritual circumcision   . Type II diabetes mellitus ([place])   . Uveitis    Review of Systems: Review of Systems  Constitutional: Negative for chills and fever.  Respiratory: Negative for shortness of breath.   Cardiovascular: Negative for chest pain, palpitations and leg swelling.  Gastrointestinal: Negative for abdominal pain, diarrhea, nausea and vomiting.  Neurological: Negative for dizziness.   Physical Exam:  Vitals:   06/30/19 1538  BP: 118/88  Pulse: 76  Temp: 98.6 F (37 C)    TempSrc: Oral  SpO2: 99%  Weight: 200 lb 9.6 oz (91 kg)    Physical Exam Vitals and nursing note reviewed.  Constitutional:      General: She is not in acute distress.    Appearance: She is obese.  Cardiovascular:     Rate and Rhythm: Normal rate and regular rhythm.     Heart sounds: No murmur.  Pulmonary:     Effort: Pulmonary effort is normal. No respiratory distress.  Musculoskeletal:        General: No tenderness or deformity.     Right lower leg: No edema.     Left lower leg: No edema.  Neurological:     General: No focal deficit present.     Mental Status: She is alert and oriented to person, place, and time.     Gait: Gait normal.  Psychiatric:        Mood and Affect: Mood normal.        Behavior: Behavior normal.     Assessment & Plan:   See Encounters Tab for problem based charting.  Patient discussed with Dr. Daryll Drown

## 2019-07-04 NOTE — Assessment & Plan Note (Signed)
Ms. Bonica states lower extremity edema has improved significantly since taking Lasix PRN.   Plan:  - Continue Lasix 20 mg PRN for leg swelling

## 2019-07-04 NOTE — Assessment & Plan Note (Signed)
Vitamin D level continues to be low but improving at 23.6.   Plan - Continue vitamin D supplementation

## 2019-07-04 NOTE — Assessment & Plan Note (Signed)
Lab Results  Component Value Date   CHOL 222 (H) 06/07/2019   HDL 56 06/07/2019   LDLCALC 144 (H) 06/07/2019   TRIG 126 06/07/2019   CHOLHDL 4.0 06/07/2019   Last lipid panel continues to be elevated. We discussed Atorvastatin and refilled at this visit.  Plan:  - Atorvastatin 40 mg  - Consider increasing to 80 if no improvement in 6 months

## 2019-07-06 NOTE — Progress Notes (Signed)
Internal Medicine Clinic Attending  Case discussed with Dr. Basaraba at the time of the visit.  We reviewed the resident's history and exam and pertinent patient test results.  I agree with the assessment, diagnosis, and plan of care documented in the resident's note.    

## 2019-07-07 ENCOUNTER — Telehealth: Payer: Self-pay | Admitting: *Deleted

## 2019-07-07 NOTE — Telephone Encounter (Addendum)
Information was sent to [employer] for PA for Victoza .  Conf # [id number].  Awaiting determination.  Sander Nephew, RN 07/07/2019 10:05 AM. PA for Victoza approve 07/07/2019 thru 07/06/2020.  Sander Nephew, RN 07/07/2019 5:00 PM.

## 2019-07-12 ENCOUNTER — Ambulatory Visit: Payer: Self-pay

## 2019-07-12 NOTE — Chronic Care Management (AMB) (Signed)
  Chronic Care Management   Outreach Note  07/12/2019 Name: Jennifer [name] MRN: [id number] DOB: 02-02-1971  Referred by: Jose Persia, MD Reason for referral : Care Coordination (Adherence concerns)   Unsuccessful outreach to patient via [employer] [phone]). The patient was referred to the case management team for assistance with care management and care coordination.   Follow Up Plan: The care management team will attempt to reach patient again over the next 7-10 days days.       Ronn Melena, [place] Coordination Social Worker [place] [phone]

## 2019-07-19 ENCOUNTER — Ambulatory Visit: Payer: Self-pay

## 2019-07-19 NOTE — Chronic Care Management (AMB) (Signed)
  Chronic Care Management   Outreach Note  07/19/2019 Name: Jennifer [name] MRN: [id number] DOB: [birth year]  Referred by: Jose Persia, MD Reason for referral : Care Coordination (Medication Adherence)   A second unsuccessful telephone outreach via [facility] [phone]) was attempted today. The patient was referred to the case management team for assistance with care management and care coordination. Interpreter unable to leave message due to mailbox being full.   Follow Up Plan: The care management team will attempt to reach the patient again over the next 10 days.    Ronn Melena, [place] Coordination Social Worker [place] [phone]

## 2019-07-27 ENCOUNTER — Ambulatory Visit: Payer: Self-pay

## 2019-07-27 NOTE — Chronic Care Management (AMB) (Signed)
  Chronic Care Management   Outreach Note  07/27/2019 Name: Jennifer [name] MRN: [id number] DOB: [birth year]  Referred by: Jose Persia, MD Reason for referral : Care Coordination (Medication Adherence)   Third unsuccessful telephone outreach was attempted today via [employer]. The patient was referred to the case management team for assistance with care management and care coordination. The patient's primary care provider has been notified of our unsuccessful attempts to make or maintain contact with the patient. The care management team is pleased to engage with this patient at any time in the future should he/she be interested in assistance from the care management team.   Follow Up Plan: The care management team is available to follow up with the patient after provider conversation with the patient regarding recommendation for care management engagement and subsequent re-referral to the care management team.        Ronn Melena, [employer] Worker [place] [phone]

## 2019-07-27 NOTE — Progress Notes (Signed)
Internal Medicine Clinic Resident   I have personally reviewed this encounter including the documentation in this note and/or discussed this patient with the care management provider. I will address any urgent items identified by the care management provider and will communicate my actions to the patient's PCP. I have reviewed the patient's CCM visit with my supervising attending.  Delice Bison, DO 07/27/2019

## 2019-09-17 ENCOUNTER — Encounter (HOSPITAL_COMMUNITY): Payer: Self-pay

## 2019-09-17 ENCOUNTER — Emergency Department (HOSPITAL_COMMUNITY)
Admission: EM | Admit: 2019-09-17 | Discharge: 2019-09-18 | Disposition: A | Discharge status: Home / Self Care | Payer: Medicaid Other | Attending: Emergency Medicine | Admitting: Emergency Medicine

## 2019-09-17 ENCOUNTER — Emergency Department (HOSPITAL_COMMUNITY): Payer: Medicaid Other

## 2019-09-17 DIAGNOSIS — Y939 Activity, unspecified: Secondary | ICD-10-CM | POA: Insufficient documentation

## 2019-09-17 DIAGNOSIS — I10 Essential (primary) hypertension: Secondary | ICD-10-CM | POA: Diagnosis not present

## 2019-09-17 DIAGNOSIS — R52 Pain, unspecified: Secondary | ICD-10-CM

## 2019-09-17 DIAGNOSIS — Y999 Unspecified external cause status: Secondary | ICD-10-CM | POA: Diagnosis not present

## 2019-09-17 DIAGNOSIS — Z7901 Long term (current) use of anticoagulants: Secondary | ICD-10-CM | POA: Insufficient documentation

## 2019-09-17 DIAGNOSIS — Z79899 Other long term (current) drug therapy: Secondary | ICD-10-CM | POA: Diagnosis not present

## 2019-09-17 DIAGNOSIS — Z794 Long term (current) use of insulin: Secondary | ICD-10-CM | POA: Insufficient documentation

## 2019-09-17 DIAGNOSIS — W010XXA Fall on same level from slipping, tripping and stumbling without subsequent striking against object, initial encounter: Secondary | ICD-10-CM | POA: Diagnosis not present

## 2019-09-17 DIAGNOSIS — Y929 Unspecified place or not applicable: Secondary | ICD-10-CM | POA: Diagnosis not present

## 2019-09-17 DIAGNOSIS — M25571 Pain in right ankle and joints of right foot: Secondary | ICD-10-CM

## 2019-09-17 DIAGNOSIS — E119 Type 2 diabetes mellitus without complications: Secondary | ICD-10-CM | POA: Diagnosis not present

## 2019-09-17 DIAGNOSIS — W19XXXA Unspecified fall, initial encounter: Secondary | ICD-10-CM

## 2019-09-17 NOTE — ED Triage Notes (Signed)
Pt was on the porch missed a step and fell, swelling to R ankle, some swelling to L as well, did not hit head, no LOC.

## 2019-09-18 ENCOUNTER — Other Ambulatory Visit: Payer: Self-pay

## 2019-09-18 NOTE — ED Notes (Signed)
Ortho tech paged  

## 2019-09-18 NOTE — Progress Notes (Signed)
Orthopedic Tech Progress Note Patient Details:  Gabrial Kostick 09-05-1970 [id number]  Ortho Devices Type of Ortho Device: ASO, Crutches Ortho Device/Splint Location: Right Ankle Ortho Device/Splint Interventions: Application   Post Interventions Patient Tolerated: Well, Ambulated well Instructions Provided: Adjustment of device, Poper ambulation with device   Austin E Bullins 09/18/2019, 4:11 AM

## 2019-09-18 NOTE — ED Notes (Signed)
Discharge papers discussed with pt caregiver. Discussed s/sx to return, follow up with PCP, medications given/next dose due. Caregiver verbalized understanding.  ?

## 2019-09-18 NOTE — ED Notes (Signed)
Ortho tech at bedside 

## 2019-09-18 NOTE — ED Notes (Signed)
ED Provider at bedside. 

## 2019-09-18 NOTE — Discharge Instructions (Signed)
Ice and elevate to reduce swelling. Use the ASO splint when ambulatory. Use the crutches to be weight bearing as tolerated.   If pain is no better in 4-5 days, see your doctor for recheck of the injury.

## 2019-09-18 NOTE — ED Provider Notes (Signed)
[facility] EMERGENCY DEPARTMENT Provider Note   CSN: [id number] Arrival date & time: 09/17/19  2230     History Chief Complaint  Patient presents with  . Ankle Pain    Jennifer [name] is a 49 y.o. female.  Patient presents to ED with complaint of ankle pain after mechanical fall earlier tonight. She fell after tripping and feels her left ankle inverted and right ankle everted. She reports the discomfort is greatest in the right ankle. She has remained ambulatory with some difficulty secondary to pain of right ankle. No other injury.    The history is provided by the patient and a relative. A language interpreter was used (Adult Daughter at bedside).  Ankle Pain      Past Medical History:  Diagnosis Date  . Anemia, iron deficiency   . Atrial fibrillation ([place])   . Blindness of left eye   . Decreased visual acuity    Left eye  . 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 Hair loss   . History of fracture of clavicle 05/18/2015  . Hyperlipidemia   . Iron deficiency anemia 05/13/2013  . Pain, dental 08/19/2018   Tooth pain/facial swelling: has poor dentition at baseline, history of dental abscess.  She has not seen a dentist in about one year.  Dentist is on [address].  No fevers chills or systemic symptoms.  Diabetes has been poorly controlled for some time.  She said it has improved recently averaging around 140.  Called the dentist said they would not see patients until May 14th but the urgency o  . Pap smear abnormality of cervix with LGSIL   . Routine/ritual circumcision   . Type II diabetes mellitus ([place])   . Uveitis     Patient Active Problem List   Diagnosis Date Noted  . Bilateral lower extremity edema 06/07/2019  . Atrial fibrillation ([place]) 09/20/2018  . Migraine   . Hematuria 08/24/2018  . Vulvovaginitis  05/21/2018  . (HFpEF) heart failure with preserved ejection fraction ([place]) 01/01/2018  . Left anterior shoulder pain 07/06/2017  . Hypertension associated with diabetes ([place]) 07/23/2016  . 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 Fatigue 04/02/2016  . 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 Vitamin D deficiency 10/30/2014  . Hair loss 03/24/2012  . Healthcare maintenance 02/26/2011  . Hyperlipidemia 05/05/2006  . Type II diabetes mellitus ([place]) 04/22/1998    Past Surgical History:  Procedure Laterality Date  . CESAREAN SECTION       OB History    Gravida  7   Para  4   Term  4   Preterm      AB  3   Living  4     SAB  3   TAB      Ectopic      Multiple      Live Births              Family History  Problem Relation Age of Onset  . Diabetes Father     Social History   Tobacco Use  . Smoking status: Never Smoker  . Smokeless tobacco: Never Used  Substance Use Topics  . Alcohol use: No  . Drug use: No    Home Medications Prior to Admission medications   Medication Sig Start Date End Date Taking? Authorizing Provider  Accu-Chek FastClix Lancets MISC Check blood sugar 4 times a day 06/07/19   Jose Persia, MD  apixaban (ELIQUIS) 5 MG TABS tablet Take 1 tablet (5 mg total) by mouth 2 (two) times daily. 06/30/19   Jose Persia, MD  atorvastatin (LIPITOR) 40 MG tablet Take 1 tablet (40 mg total) by mouth daily. 06/30/19   Jose Persia, MD  Blood Glucose Monitoring Suppl (ACCU-CHEK GUIDE) w/Device KIT 1 each by Does not apply route 4 (four) times daily. 11/19/18   Jose Persia, MD  brimonidine (ALPHAGAN) 0.2 % ophthalmic solution Place 1 drop into the left eye 3 (three) times daily.     [provider]  dorzolamide-timolol (COSOPT) 22.3-6.8 MG/ML ophthalmic solution Place 1 drop into the left eye 2 (two) times daily.    [provider]  furosemide (LASIX) 20 MG tablet Take 1 tablet (20 mg total) by  mouth daily as needed (leg swelling). 06/07/19   Jose Persia, MD  glucose blood (ACCU-CHEK GUIDE) test strip Check blood sugar 4 times per day 06/07/19   Jose Persia, MD  Insulin NPH, Human,, Isophane, (HUMULIN N) 100 UNIT/ML Kiwkpen INJECT 40 UNITS SUBCUTANEOUSLY EVERY MORNING AND 40 UNITS EVERY EVENING. Diag code E11.9 06/07/19   Jose Persia, MD  Insulin Pen Needle 31G X 5 MM MISC 1 Dose by Does not apply route 2 (two) times daily. 05/20/18   Alphonzo Grieve, MD  ketorolac (ACULAR) 0.5 % ophthalmic solution Place 1 drop into both eyes 4 (four) times daily.    [provider]  liraglutide (VICTOZA) 18 MG/3ML SOPN Inject 0.2 mLs (1.2 mg total) into the skin daily. 06/30/19   Jose Persia, MD  lisinopril (ZESTRIL) 10 MG tablet Take 1 tablet (10 mg total) by mouth daily. 06/30/19   Jose Persia, MD  metFORMIN (GLUCOPHAGE) 1000 MG tablet TAKE 1 TABLET (1,000 MG TOTAL) BY MOUTH 2 (TWO) TIMES DAILY WITH A MEAL. 06/11/18   Alphonzo Grieve, MD  ferrous sulfate 325 (65 FE) MG tablet Take 1 tablet (325 mg total) by mouth daily. Patient not taking: Reported on 06/12/2018 04/29/17 05/15/19  Alphonzo Grieve, MD    Allergies    Patient has no known allergies.  Review of Systems   Review of Systems  Musculoskeletal:       See HPI.  Skin: Negative.  Negative for wound.  Neurological: Negative.  Negative for weakness.    Physical Exam Updated Vital Signs BP (!) 159/72 (BP Location: Right Arm)   Pulse 73   Temp 98.6 F (37 C) (Oral)   Resp 18   SpO2 99%   Physical Exam Vitals and nursing note reviewed.  Constitutional:      Appearance: She is well-developed.  Pulmonary:     Effort: Pulmonary effort is normal.  Musculoskeletal:     Cervical back: Normal range of motion.     Comments: Right ankle moderately swollen bilaterally. No discoloration or wound. No bony deformity. The joint is stable. Achilles intact. Left ankle has mild swelling, is minimally tender. Joint stable.     Skin:    General: Skin is warm and dry.  Neurological:     Mental Status: She is alert and oriented to person, place, and time.     ED Results / Procedures / Treatments   Labs (all labs ordered are listed, but only abnormal results are displayed) Labs Reviewed - No data to display  EKG None  Radiology DG Ankle Complete Left  Result Date: 09/17/2019 CLINICAL DATA:  Post fall with  bilateral ankle pain. EXAM: LEFT ANKLE COMPLETE - 3+ VIEW COMPARISON:  Radiograph 10/07/2016 FINDINGS: No acute fracture or dislocation. There is an osteochondral lesion of the medial talar dome that spans approximately 14 mm transverse. No evidence of subchondral collapse 1 this is new from prior exam. Ankle mortise is preserved. No significant ankle joint effusion. Small Achilles tendon enthesophyte. There is mild generalized soft tissue edema which is similar to prior exam. IMPRESSION: 1. No acute fracture or dislocation. 2. 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 Electronically Signed   By: Keith Rake M.D.   On: 09/17/2019 23:13   DG Ankle Complete Right  Result Date: 09/17/2019 CLINICAL DATA:  Fall with bilateral ankle pain. EXAM: RIGHT ANKLE - COMPLETE 3+ VIEW COMPARISON:  10/07/2016 FINDINGS: Possible tiny acute avulsion injury from the dorsal talus. No other acute fracture. Ankle mortise is preserved. Mild degenerative spurring of the distal tibia. No significant joint effusion. Generalized soft tissue edema. IMPRESSION: Possible tiny acute avulsion injury from the dorsal talus. No other acute fracture. Electronically Signed   By: Keith Rake M.D.   On: 09/17/2019 23:15    Procedures Procedures (including critical care time)  Medications Ordered in ED Medications - No data to display  ED Course  I have reviewed the triage vital signs and the nursing notes.  Pertinent labs & imaging results that were available during my care of the patient were reviewed by  me and considered in my medical decision making (see chart for details).    MDM Rules/Calculators/A&P                      Patient to ED for evaluation of ankle pain after fall earlier tonight.   Imaging of left ankle negative. Right ankle findings c/w moderate to severe sprain. Neurovascularly intact.   ASO to right ankle and crutches provided. REcommend ibuprofen, however, daughter prefers aspirin for discomfort. Discussed use of aspirin in the setting of Eliquis use. Recommended follow up with PCP if pain does not improve over the next 4-5 days.    Final Clinical Impression(s) / ED Diagnoses Final diagnoses:  Pain   1. Right ankle sprain 2. Mechanical fall  Rx / DC Orders ED Discharge Orders    None       Charlann Lange, PA-C 09/18/19 0357    Ripley Fraise, MD 09/18/19 [phone]

## 2019-09-18 NOTE — ED Notes (Signed)
Pt daughter assisted patient to bathroom

## 2019-09-28 ENCOUNTER — Ambulatory Visit: Payer: Medicaid Other | Admitting: Internal Medicine

## 2019-09-28 ENCOUNTER — Other Ambulatory Visit: Payer: Self-pay

## 2019-09-28 VITALS — BP 143/73 | HR 73 | Temp 98.2°F | Ht 66.0 in | Wt 211.0 lb

## 2019-09-28 DIAGNOSIS — S99911D Unspecified injury of right ankle, subsequent encounter: Secondary | ICD-10-CM

## 2019-09-28 DIAGNOSIS — S99911A Unspecified injury of right ankle, initial encounter: Secondary | ICD-10-CM | POA: Insufficient documentation

## 2019-09-28 DIAGNOSIS — E119 Type 2 diabetes mellitus without complications: Secondary | ICD-10-CM

## 2019-09-28 DIAGNOSIS — S82891D Other fracture of right lower leg, subsequent encounter for closed fracture with routine healing: Secondary | ICD-10-CM

## 2019-09-28 DIAGNOSIS — E11319 Type 2 diabetes mellitus with unspecified diabetic retinopathy without macular edema: Secondary | ICD-10-CM

## 2019-09-28 DIAGNOSIS — Z794 Long term (current) use of insulin: Secondary | ICD-10-CM

## 2019-09-28 DIAGNOSIS — W109XXD Fall (on) (from) unspecified stairs and steps, subsequent encounter: Secondary | ICD-10-CM

## 2019-09-28 MED ORDER — NOVOLOG MIX 70/30 FLEXPEN (70-30) 100 UNIT/ML ~~LOC~~ SUPN
PEN_INJECTOR | SUBCUTANEOUS | 1 refills | Status: DC
Start: 2019-09-28 — End: 2020-02-08

## 2019-09-28 MED ORDER — TRULICITY 0.75 MG/0.5ML ~~LOC~~ SOAJ
0.7500 mg | SUBCUTANEOUS | 1 refills | Status: DC
Start: 2019-09-28 — End: 2020-02-08

## 2019-09-28 NOTE — Patient Instructions (Addendum)
[name],  It was a pleasure to see you today. Thank you for coming in.   Today we discussed your foot pain. In regards to this please see the podiatrist, keep off the ankle for now until you follow up.   We also discussed your diabetes. Please start taking trulicity once a week, do not take the victoza. Please switch your insulin to the Novolog 70/30, 40 units in AM and 35 units in PM. Please bring all your medications with you on your next visit. Please continue checking your blood sugars at home.    Please return to clinic in 1 month or sooner if needed.   Thank you again for coming in.   Lonia Skinner M.D.             .  .     .                  .       .     trulicity        .       Novolog 70/30   40   AM  35   PM.          .         .              .       .    (  ) alsayidat samiat alhasan kan min dawaei sururi 'an 'arak alyawma. shukrana lidukhulika. alyawm naqashnua alam qadamak. fima yataealaq [name] 'akhisaayiy al'aqdam , walaibtiead ean alkahil fi alwaqt alhalii hataa taqum bialmutabaeati. naqashna aydana marad alsukarii [employer]. min fadlik abda bi'akhdh trulicity [employer] fi al'usbue , la takhudh alfiktuza. yurjaa tabdil al'ansulin alkhasi bik 'iilaa Novolog 70/30 , w 44 wahdat fi AM w 35 wahdat fi PM. yurjaa 'iihdar jamie al'adwiat alkhasat bik maeak fi ziaratik alqadimatu. yurjaa mutabaeat fahs nisbat alsukar fi aldam fi almanzili. yurjaa aleawdat 'iilaa aleiadat baed shahr wahid 'aw  qabl dhalik 'iidha lazim al'amra. shukrana lak maratan 'ukhraa ealaa [name] (duktur fi altab)

## 2019-09-28 NOTE — Assessment & Plan Note (Signed)
Patient reports that she tripped and fell while she was walking down the stairs, she twisted her ankle and fell done. She went to the urgent care on 09/17/19 and had x-rays done. Left ankle x-ray was unremarkable and right ankle x-ray showed a possible tiny acute avulsion injury from the dorsal talus, no other acute fracture, mild degenerative spurring of the distal tibia. She was given ASO to right ankle and provided crutches. She reports that she has been walking around at home with no issues, she does not have the ASO on today. She denies any pain today, she has not been taking any medications. She reports that she has some numbness on the bottom of both her feet. She has uncontrolled diabetes at this time. She reports that she still has some swelling, is not sure if it's any better or worse, no redness or warmth noted. On exam she has some TTP right under the lateral malleous on the right ankle, 1+ edema around both ankles, normal ROM. Discussed that she may need a splint for the small fracture and that since she has uncontrolled diabetes that we can refer her to podiatry to monitor both these conditions. She is in agreement with this plan.   -Podiatry referral -Advised to continue ASO and avoid weight bearing

## 2019-09-28 NOTE — Assessment & Plan Note (Signed)
She is taking Humulin N 40 units in AM and 35 units in PM. She has Victoza listed however she is not taking this medication. She reports that metformin caused her to have diarrhea. She reports that she checks her CBGs about once per day, it is around 94-200. Does not have meter with her. Reports some polyuria. Last A1c was >14. Discussed adjusting her medications. Novolog 70/30 should have a better coverage and may be longer acting. Also discussed starting Trulicity since this is a once a week medication and should be covered by her insurance. She in agreement with these changes.   -Start Trulicity 0/03 mg weekly -Switch from Humulin N to Novolog 70/30 40 units in AM and 35 units in AM -Consider trying long acting metformin in the future, may have less side effects -Advised to bring in all her medications on her next visit -Advised to continue checking blood sugars at home and bring in meter on next visit -RTC in 1 month

## 2019-09-28 NOTE — Progress Notes (Signed)
   CC: Right ankle pain and swelling and ankle fracture, and diabetes  HPI:  [name] is a 49 y.o. with history of atrial fibrillation, hyperlipidemia, and uncontrolled diabetes presenting for right ankle pain and swelling. Translator was used.   Past Medical History:  Diagnosis Date  . Anemia, iron deficiency   . Atrial fibrillation ([place])   . Blindness of left eye   . Decreased visual acuity    Left eye  . 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 Hair loss   . History of fracture of clavicle 05/18/2015  . Hyperlipidemia   . Iron deficiency anemia 05/13/2013  . Pain, dental 08/19/2018   Tooth pain/facial swelling: has poor dentition at baseline, history of dental abscess.  She has not seen a dentist in about one year.  Dentist is on [address].  No fevers chills or systemic symptoms.  Diabetes has been poorly controlled for some time.  She said it has improved recently averaging around 140.  Called the dentist said they would not see patients until May 14th but the urgency o  . Pap smear abnormality of cervix with LGSIL   . Routine/ritual circumcision   . Type II diabetes mellitus ([place])   . Uveitis    Review of Systems:   Constitutional: Negative for chills and fever.  Respiratory: Negative for shortness of breath.    Cardiovascular: Negative for chest pain and leg swelling.  Gastrointestinal: Negative for abdominal pain, nausea and vomiting.  MSK: Right ankle pain to palpation, negative for weakness or limited movement Neurological: Negative for dizziness and headaches.    Physical Exam:  Vitals:   09/28/19 1033  BP: (!) 143/73  Pulse: 73  Temp: 98.2 F (36.8 C)  TempSrc: Oral  SpO2: 100%  Weight: 211 lb (95.7 kg)  Height: 5\' 6"  (1.676 m)   Physical Exam  Constitutional: She is oriented to person, place, and time and  well-developed, well-nourished, and in no distress.  HENT:  Head: Normocephalic and atraumatic.  Eyes: Pupils are equal, round, and reactive to light. EOM are normal.  Cardiovascular: Normal rate, regular rhythm and normal heart sounds.  Pulmonary/Chest: Effort normal and breath sounds normal. No respiratory distress.  Abdominal: Soft. Bowel sounds are normal. She exhibits no distension.  Musculoskeletal:        General: Edema (1+ BL ankle) present. Normal range of motion.     Cervical back: Normal range of motion and neck supple.  Neurological: She is alert and oriented to person, place, and time.  Skin: Skin is warm and dry.  Psychiatric: Mood and affect normal.    Assessment & Plan:   See Encounters Tab for problem based charting.  Patient discussed with Dr. Heber Montello

## 2019-09-29 ENCOUNTER — Telehealth: Payer: Self-pay | Admitting: *Deleted

## 2019-09-29 NOTE — Telephone Encounter (Signed)
Information was sent through [employer] for PA for Trulicity 4.53 mg.  Awaiting determination.  [id number] W.   Approved 09/29/2019 -6/0/2022.  Sander Nephew, RN 09/29/2019 3:19 PM.

## 2019-10-01 NOTE — Progress Notes (Signed)
Internal Medicine Clinic Attending  Case discussed with Dr. Krienke at the time of the visit.  We reviewed the resident's history and exam and pertinent patient test results.  I agree with the assessment, diagnosis, and plan of care documented in the resident's note.    

## 2019-10-05 ENCOUNTER — Encounter: Payer: Self-pay | Admitting: *Deleted

## 2019-10-12 ENCOUNTER — Ambulatory Visit: Payer: Medicaid Other | Admitting: Internal Medicine

## 2019-10-12 VITALS — BP 122/72 | HR 73 | Temp 98.1°F | Ht 64.0 in | Wt 208.0 lb

## 2019-10-12 DIAGNOSIS — E1165 Type 2 diabetes mellitus with hyperglycemia: Secondary | ICD-10-CM

## 2019-10-12 DIAGNOSIS — I152 Hypertension secondary to endocrine disorders: Secondary | ICD-10-CM | POA: Diagnosis not present

## 2019-10-12 DIAGNOSIS — E1159 Type 2 diabetes mellitus with other circulatory complications: Secondary | ICD-10-CM

## 2019-10-12 DIAGNOSIS — S99911D Unspecified injury of right ankle, subsequent encounter: Secondary | ICD-10-CM

## 2019-10-12 DIAGNOSIS — E11319 Type 2 diabetes mellitus with unspecified diabetic retinopathy without macular edema: Secondary | ICD-10-CM

## 2019-10-12 DIAGNOSIS — I4891 Unspecified atrial fibrillation: Secondary | ICD-10-CM | POA: Diagnosis not present

## 2019-10-12 DIAGNOSIS — I480 Paroxysmal atrial fibrillation: Secondary | ICD-10-CM

## 2019-10-12 LAB — POCT GLYCOSYLATED HEMOGLOBIN (HGB A1C): HbA1c POC (<> result, manual entry): >14 % — AB (ref 4.0–5.6)

## 2019-10-12 LAB — GLUCOSE, CAPILLARY: Glucose-Capillary: 309 mg/dL — ABNORMAL HIGH (ref 70–99)

## 2019-10-12 NOTE — Patient Instructions (Signed)
It was nice seeing you today! Thank you for choosing [facility] for your Primary Care.    Today we talked about:   1. Continue to wear your ankle brace

## 2019-10-12 NOTE — Progress Notes (Signed)
   CC: right ankle pain;diabetes follow up  HPI:  [name] is a 49 y.o. with a PMHx as listed below who presents to the clinic for right ankle pain; diabetes follow up.   Please see the Encounters tab for problem-based Assessment & Plan regarding status of patient's chronic conditions.  Past Medical History:  Diagnosis Date  . Anemia, iron deficiency   . Atrial fibrillation ([place])   . Blindness of left eye   . Decreased visual acuity    Left eye  . 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 Hair loss   . History of fracture of clavicle 05/18/2015  . Hyperlipidemia   . Iron deficiency anemia 05/13/2013  . Pain, dental 08/19/2018   Tooth pain/facial swelling: has poor dentition at baseline, history of dental abscess.  She has not seen a dentist in about one year.  Dentist is on [address].  No fevers chills or systemic symptoms.  Diabetes has been poorly controlled for some time.  She said it has improved recently averaging around 140.  Called the dentist said they would not see patients until May 14th but the urgency o  . Pap smear abnormality of cervix with LGSIL   . Routine/ritual circumcision   . Type II diabetes mellitus ([place])   . Uveitis    Review of Systems: Review of Systems  Constitutional: Negative for chills, fever and weight loss.  Respiratory: Negative for shortness of breath.   Cardiovascular: Negative for chest pain and palpitations.  Gastrointestinal: Negative for abdominal pain, diarrhea, nausea and vomiting.  Musculoskeletal: Positive for joint pain. Negative for falls.  Neurological: Positive for sensory change (plantar surface foot numbness). Negative for focal weakness.   Physical Exam:  Vitals:   10/12/19 1611  BP: 122/72  Pulse: 73  Temp: 98.1 F (36.7 C)  TempSrc: Oral  SpO2: 95%  Weight: 208 lb (94.3 kg)    Height: 5\' 4"  (1.626 m)   Physical Exam Vitals and nursing note reviewed.  Constitutional:      General: She is not in acute distress.    Appearance: She is normal weight.  HENT:     Head: Normocephalic and atraumatic.  Cardiovascular:     Rate and Rhythm: Normal rate and regular rhythm.     Heart sounds: No murmur heard.  No gallop.   Pulmonary:     Effort: Pulmonary effort is normal. No respiratory distress.  Musculoskeletal:     Right lower leg: Edema (trace pitting) present.     Left lower leg: Edema (trace pitting) present.     Right ankle: No swelling, deformity, ecchymosis or lacerations. No tenderness. Normal range of motion.  Skin:    General: Skin is warm and dry.  Neurological:     General: No focal deficit present.     Mental Status: She is alert and oriented to person, place, and time. Mental status is at baseline.  Psychiatric:        Mood and Affect: Mood normal.        Behavior: Behavior normal.    Assessment & Plan:   See Encounters Tab for problem based charting.  Patient discussed with Dr. Philipp Ovens

## 2019-10-13 ENCOUNTER — Other Ambulatory Visit: Payer: Self-pay | Admitting: *Deleted

## 2019-10-13 DIAGNOSIS — E11319 Type 2 diabetes mellitus with unspecified diabetic retinopathy without macular edema: Secondary | ICD-10-CM

## 2019-10-13 MED ORDER — METFORMIN HCL 1000 MG PO TABS: 1000.0000 mg | ORAL_TABLET | Freq: Two times a day (BID) | ORAL | 3 refills | Status: DC

## 2019-10-13 NOTE — Assessment & Plan Note (Signed)
BP: 122/72   Jennifer [name] endorses taking Lisinopril daily. No acute complaints at this time.   Assessment/Plan:  Well controlled. Continue with current management.   - Lisinopril 10 mg daily

## 2019-10-13 NOTE — Assessment & Plan Note (Signed)
Lab Results  Component Value Date   HGBA1C >14.0 (A) 10/12/2019   Jennifer [name] states she has been taking the Trulicity and has had no difficulty or side effects. She was confused about how often she needed to use the Novolin though. She has not been taking her Metformin. She denies any hypoglycemic events; denies any dizziness, weakness, jittery-ness, palpitations. She endorses that she takes her sugars at home, and they are between 200-300 usually. She did not bring her meter with her today.   Assessment/Plan:  Uncontrolled. Patient's a1c is >14. We discussed extensively her medication again and reviewed the list and how to use. Her daughter in the room states family will try to help.   - Metformin 8206 mg BID - Trulicity  - Novolog 01/56, 40 units the AM, 35 units in the PM

## 2019-10-13 NOTE — Assessment & Plan Note (Signed)
Jennifer [name] states that overall, her ankle pain is improving. She only uses her ASO ankle brace intermittently when her ankle hurts more than other times. For pain relief, she has been using Naproxen.   Assessment/Plan:  - Encouraged daily ankle brace use - Recommended Tylenol - Limit Naproxen use

## 2019-10-13 NOTE — Assessment & Plan Note (Addendum)
Ms. Hinz denies any palpitations or chest pain. She states she has not been taking her Apixaban and has not follow up with Cardiology.   Assessment/Plan:  Sinus rhythm today. CHADs-VASC score of 3, indicating she is a candidate for anti-coagulation. I have spoken with her about this before. We reviewed her medication and she will work on taking her Eliquis. Strongly recommended she follow up with Cardiology as well and provided their clinic information.    - Eliquis 5 mg daily

## 2019-10-15 NOTE — Progress Notes (Signed)
Internal Medicine Clinic Attending  Case discussed with Dr. Basaraba at the time of the visit.  We reviewed the resident's history and exam and pertinent patient test results.  I agree with the assessment, diagnosis, and plan of care documented in the resident's note.    

## 2019-10-15 NOTE — Addendum Note (Signed)
Addended by: Jodean Lima on: 10/15/2019 10:35 AM   Modules accepted: Level of Service

## 2019-10-27 ENCOUNTER — Ambulatory Visit: Payer: Medicaid Other | Admitting: Podiatry

## 2019-10-27 ENCOUNTER — Other Ambulatory Visit: Payer: Self-pay

## 2019-10-27 ENCOUNTER — Encounter: Payer: Self-pay | Admitting: Podiatry

## 2019-10-27 VITALS — Temp 97.6°F

## 2019-10-27 DIAGNOSIS — E1149 Type 2 diabetes mellitus with other diabetic neurological complication: Secondary | ICD-10-CM

## 2019-10-27 DIAGNOSIS — B351 Tinea unguium: Secondary | ICD-10-CM | POA: Diagnosis not present

## 2019-10-27 DIAGNOSIS — M79674 Pain in right toe(s): Secondary | ICD-10-CM

## 2019-10-28 NOTE — Progress Notes (Signed)
Subjective:   [name], female   DOB: 49 y.o.   MRN: [id number]   HPI Patient presents with caregiver with needing basic exam done not taking good care of her sugar with A1c of around 14 and confusion as to the issues associated with her problems    Review of Systems  All other systems reviewed and are negative.       Objective:  Physical Exam Vitals and nursing note reviewed.  Constitutional:      Appearance: She is well-developed.  Pulmonary:     Effort: Pulmonary effort is normal.  Musculoskeletal:        General: Normal range of motion.  Skin:    General: Skin is warm.  Neurological:     Mental Status: She is alert.     Neurovascular status indicates diminishment sharp dull vibratory with pulses present with slight reduction but pulses with patient noted to have mild edema thick nailbeds 1-5 both feet that are bothersome     Assessment:  Patient who is not taking good care of herself who does have some tingling burning her feet most likely due to severe elevation A1c     Plan:  Educated the patient and her daughter about this condition and the severity of problems associated with the elevated A1c.  At this point I do not see any treatment I did debride nails 1-5 bilateral and advised on elevation but the importance of getting her sugar under better control

## 2019-12-05 ENCOUNTER — Ambulatory Visit (HOSPITAL_COMMUNITY)
Admission: EM | Admit: 2019-12-05 | Discharge: 2019-12-05 | Disposition: A | Discharge status: Home / Self Care | Payer: Medicaid Other | Attending: Urgent Care | Admitting: Urgent Care

## 2019-12-05 ENCOUNTER — Encounter (HOSPITAL_COMMUNITY): Payer: Self-pay

## 2019-12-05 DIAGNOSIS — R519 Headache, unspecified: Secondary | ICD-10-CM

## 2019-12-05 DIAGNOSIS — R5383 Other fatigue: Secondary | ICD-10-CM | POA: Diagnosis not present

## 2019-12-05 DIAGNOSIS — R81 Glycosuria: Secondary | ICD-10-CM

## 2019-12-05 DIAGNOSIS — R42 Dizziness and giddiness: Secondary | ICD-10-CM | POA: Diagnosis not present

## 2019-12-05 DIAGNOSIS — E1165 Type 2 diabetes mellitus with hyperglycemia: Secondary | ICD-10-CM | POA: Diagnosis not present

## 2019-12-05 LAB — POCT URINALYSIS DIPSTICK, ED / UC
Bilirubin Urine: NEGATIVE
Glucose, UA: >=1000 mg/dL — AB
Ketones, ur: NEGATIVE mg/dL
Leukocytes,Ua: NEGATIVE
Nitrite: NEGATIVE
Protein, ur: 30 mg/dL — AB
Specific Gravity, Urine: <=1.005 (ref 1.005–1.030)
Urobilinogen, UA: 0.2 mg/dL (ref 0.0–1.0)
pH: 5 (ref 5.0–8.0)

## 2019-12-05 LAB — CBG MONITORING, ED: Glucose-Capillary: 466 mg/dL — ABNORMAL HIGH (ref 70–99)

## 2019-12-05 NOTE — ED Notes (Signed)
Notified [place], [place] of pt's bs

## 2019-12-05 NOTE — ED Triage Notes (Signed)
Pt c/o fatigue, weakness, dizziness, light headedx3 days.

## 2019-12-05 NOTE — ED Notes (Signed)
I used interpreter ipad to triage pt

## 2019-12-05 NOTE — ED Notes (Signed)
3 day history of feeling lightheaded, dizzy, poor appetite, nausea and general body aches.  encounteed patient and family member in lobby

## 2019-12-05 NOTE — ED Provider Notes (Signed)
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  Patient has also felt a little weak and fatigued.  Last A1c at the end of June was greater than 14%.  She states that she has been compliant with her diabetes medications.  Denies fever, nausea, vomiting, chest pain, shortness of breath, belly pain, dysuria, urinary frequency, hematuria.  No current facility-administered medications for this encounter.  Current Outpatient Medications:    Accu-Chek FastClix Lancets MISC, Check blood sugar 4 times a day, Disp: 200 each, Rfl: 7   apixaban (ELIQUIS) 5 MG TABS tablet, Take 1 tablet (5 mg total) by mouth 2 (two) times daily., Disp: 60 tablet, Rfl: 1   atorvastatin (LIPITOR) 40 MG tablet, Take 1 tablet (40 mg total) by mouth daily., Disp: 90 tablet, Rfl: 3   Blood Glucose Monitoring Suppl (ACCU-CHEK GUIDE) w/Device KIT, 1 each by Does not apply route 4 (four) times daily., Disp: 1 kit, Rfl: 1   brimonidine (ALPHAGAN) 0.2 % ophthalmic solution, Place 1 drop into the left eye 3 (three) times daily. , Disp: , Rfl:    dorzolamide-timolol (COSOPT) 22.3-6.8 MG/ML ophthalmic solution, Place 1 drop into the left eye 2 (two) times daily., Disp: , Rfl:    Dulaglutide (TRULICITY) 6.21 HY/8.6VH SOPN, Inject 0.5 mLs (0.75 mg total) into the skin once a week., Disp: 4 pen, Rfl: 1   furosemide (LASIX) 20 MG tablet, Take 1 tablet (20 mg total) by mouth daily as needed (leg swelling)., Disp: 90 tablet, Rfl: 3   glucose blood (ACCU-CHEK GUIDE) test strip, Check blood sugar 4 times per day, Disp: 125 each, Rfl: 12   insulin aspart protamine - aspart (NOVOLOG MIX 70/30 FLEXPEN) (70-30) 100 UNIT/ML FlexPen, Inject 0.4 mLs (40 Units total) into the skin daily with breakfast AND 0.35 mLs (35 Units total) daily with supper., Disp: 5 pen, Rfl: 1   Insulin Pen Needle 31G X 5 MM MISC, 1  Dose by Does not apply route 2 (two) times daily., Disp: 100 each, Rfl: 11   ketorolac (ACULAR) 0.5 % ophthalmic solution, Place 1 drop into both eyes 4 (four) times daily., Disp: , Rfl:    lisinopril (ZESTRIL) 10 MG tablet, Take 1 tablet (10 mg total) by mouth daily., Disp: 90 tablet, Rfl: 3   metFORMIN (GLUCOPHAGE) 1000 MG tablet, Take 1 tablet (1,000 mg total) by mouth 2 (two) times daily with a meal., Disp: 180 tablet, Rfl: 3   No Known Allergies  Past Medical History:  Diagnosis Date   Anemia, iron deficiency    Atrial fibrillation ([facility])    Blindness of left eye    Decreased visual acuity    Left eye   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  She has not seen a dentist in about one year.  Dentist is on [address].  No fevers chills or systemic symptoms.  Diabetes has been poorly controlled for some time.  She said it has improved recently averaging around 140.  Called the dentist said they would not see  patients until May 14th but the urgency o   Pap smear abnormality of cervix with LGSIL    Routine/ritual circumcision    Type II diabetes mellitus ([place])    Uveitis      Past Surgical History:  Procedure Laterality Date   CESAREAN SECTION      Family History  Problem Relation Age of Onset   Diabetes Father     Social History   Tobacco Use   Smoking status: Never Smoker   Smokeless tobacco: Never Used  Vaping Use   Vaping Use: Never used  Substance Use Topics   Alcohol use: No   Drug use: No    ROS   Objective:   Vitals: BP 114/82    Pulse 79    Temp 98.8 F (37.1 C) (Oral)    Resp  18    Ht 5' 5" (1.651 m)    Wt 207 lb (93.9 kg)    LMP 08/21/2016 (Exact Date)    SpO2 100%    BMI 34.45 kg/m   Physical Exam Constitutional:      General: She is not in acute distress.    Appearance: Normal appearance. She is well-developed and normal weight. She is not ill-appearing, toxic-appearing or diaphoretic.  HENT:     Head: Normocephalic and atraumatic.     Right Ear: External ear normal.     Left Ear: External ear normal.     Nose: Nose normal.     Mouth/Throat:     Mouth: Mucous membranes are moist.     Pharynx: Oropharynx is clear.  Eyes:     General: No scleral icterus.    Extraocular Movements: Extraocular movements intact.     Pupils: Pupils are equal, round, and reactive to light.  Cardiovascular:     Rate and Rhythm: Normal rate and regular rhythm.     Pulses: Normal pulses.     Heart sounds: Normal heart sounds. No murmur heard.  No friction rub. No gallop.   Pulmonary:     Effort: Pulmonary effort is normal. No respiratory distress.     Breath sounds: Normal breath sounds. No stridor. No wheezing, rhonchi or rales.  Abdominal:     General: Bowel sounds are normal. There is no distension.     Palpations: Abdomen is soft. There is no mass.     Tenderness: There is no abdominal tenderness. There is no right CVA tenderness, left CVA tenderness, guarding or rebound.  Musculoskeletal:     Right lower leg: No edema.     Left lower leg: No edema.  Skin:    General: Skin is warm and dry.     Coloration: Skin is not pale.     Findings: No rash.  Neurological:     General: No focal deficit present.     Mental Status: She is alert and oriented to person, place, and time.  Psychiatric:        Mood and Affect: Mood normal.        Behavior: Behavior normal.        Thought Content: Thought content normal.        Judgment: Judgment normal.     Results for orders placed or performed during the hospital encounter of 12/05/19 (from the past 24 hour(s))  POC  Urinalysis dipstick     Status: Abnormal   Collection Time: 12/05/19  5:53 PM  Result Value Ref Range   Glucose, UA >=1000 (A) NEGATIVE mg/dL   Bilirubin Urine NEGATIVE NEGATIVE  Ketones, ur NEGATIVE NEGATIVE mg/dL   Specific Gravity, Urine <=1.005 1.005 - 1.030   Hgb urine dipstick TRACE (A) NEGATIVE   pH 5.0 5.0 - 8.0   Protein, ur 30 (A) NEGATIVE mg/dL   Urobilinogen, UA 0.2 0.0 - 1.0 mg/dL   Nitrite NEGATIVE NEGATIVE   Leukocytes,Ua NEGATIVE NEGATIVE  POC CBG monitoring     Status: Abnormal   Collection Time: 12/05/19  6:21 PM  Result Value Ref Range   Glucose-Capillary 466 (H) 70 - 99 mg/dL    Assessment and Plan :   PDMP not reviewed this encounter.  1. Uncontrolled type 2 diabetes mellitus with hyperglycemia ([place])   2. Glucosuria   3. Dizziness   4. Generalized headaches     Patient has severely uncontrolled diabetes and suspect that this is the primary source of her symptoms.  No sounds of rales or crackles on lung exam.  No abnormal heart sounds.  No signs of UTI.  Vital signs otherwise very stable for outpatient management and close follow-up with her PCP for better management and control of her diabetes. Counseled patient on potential for adverse effects with medications prescribed/recommended today, ER and return-to-clinic precautions discussed, patient verbalized understanding.    Jaynee Eagles, [place] 12/05/19 1829

## 2020-01-25 ENCOUNTER — Encounter (HOSPITAL_COMMUNITY): Payer: Self-pay | Admitting: Emergency Medicine

## 2020-01-25 ENCOUNTER — Other Ambulatory Visit: Payer: Self-pay

## 2020-01-25 ENCOUNTER — Encounter: Payer: Medicaid Other | Admitting: Internal Medicine

## 2020-01-25 ENCOUNTER — Telehealth: Payer: Self-pay | Admitting: Dietician

## 2020-01-25 ENCOUNTER — Emergency Department (HOSPITAL_COMMUNITY)
Admission: EM | Admit: 2020-01-25 | Discharge: 2020-01-25 | Disposition: A | Discharge status: Home / Self Care | Payer: Medicaid Other | Attending: Emergency Medicine | Admitting: Emergency Medicine

## 2020-01-25 DIAGNOSIS — Z7984 Long term (current) use of oral hypoglycemic drugs: Secondary | ICD-10-CM | POA: Insufficient documentation

## 2020-01-25 DIAGNOSIS — Z79899 Other long term (current) drug therapy: Secondary | ICD-10-CM | POA: Diagnosis not present

## 2020-01-25 DIAGNOSIS — Z794 Long term (current) use of insulin: Secondary | ICD-10-CM | POA: Insufficient documentation

## 2020-01-25 DIAGNOSIS — Z20822 Contact with and (suspected) exposure to covid-19: Secondary | ICD-10-CM | POA: Diagnosis not present

## 2020-01-25 DIAGNOSIS — E119 Type 2 diabetes mellitus without complications: Secondary | ICD-10-CM | POA: Insufficient documentation

## 2020-01-25 DIAGNOSIS — B349 Viral infection, unspecified: Secondary | ICD-10-CM

## 2020-01-25 DIAGNOSIS — Z7901 Long term (current) use of anticoagulants: Secondary | ICD-10-CM | POA: Insufficient documentation

## 2020-01-25 DIAGNOSIS — I10 Essential (primary) hypertension: Secondary | ICD-10-CM | POA: Insufficient documentation

## 2020-01-25 DIAGNOSIS — R519 Headache, unspecified: Secondary | ICD-10-CM | POA: Diagnosis present

## 2020-01-25 LAB — CBC
HCT: 37.2 % (ref 36.0–46.0)
Hemoglobin: 11.8 g/dL — ABNORMAL LOW (ref 12.0–15.0)
MCH: 27.1 pg (ref 26.0–34.0)
MCHC: 31.7 g/dL (ref 30.0–36.0)
MCV: 85.3 fL (ref 80.0–100.0)
Platelets: 189 10*3/uL (ref 150–400)
RBC: 4.36 MIL/uL (ref 3.87–5.11)
RDW: 12.1 % (ref 11.5–15.5)
WBC: 6.5 10*3/uL (ref 4.0–10.5)
nRBC: 0 % (ref 0.0–0.2)

## 2020-01-25 LAB — BASIC METABOLIC PANEL
Anion gap: 11 (ref 5–15)
BUN: 21 mg/dL — ABNORMAL HIGH (ref 6–20)
CO2: 24 mmol/L (ref 22–32)
Calcium: 9.2 mg/dL (ref 8.9–10.3)
Chloride: 98 mmol/L (ref 98–111)
Creatinine, Ser: 0.88 mg/dL (ref 0.44–1.00)
GFR calc non Af Amer: >60 mL/min (ref >60)
Glucose, Bld: 294 mg/dL — ABNORMAL HIGH (ref 70–99)
Potassium: 4.6 mmol/L (ref 3.5–5.1)
Sodium: 133 mmol/L — ABNORMAL LOW (ref 135–145)

## 2020-01-25 LAB — RESPIRATORY PANEL BY RT PCR (FLU A&B, COVID)
Influenza A by PCR: NEGATIVE
Influenza B by PCR: NEGATIVE
SARS Coronavirus 2 by RT PCR: NEGATIVE

## 2020-01-25 MED ORDER — DIPHENHYDRAMINE HCL 50 MG/ML IJ SOLN
25.0000 mg | Freq: Once | INTRAMUSCULAR | Status: AC
  Administered 2020-01-25: 25 mg via INTRAMUSCULAR
  Filled 2020-01-25: qty 1

## 2020-01-25 MED ORDER — KETOROLAC TROMETHAMINE 0.5 % OP SOLN: 1.0000 [drp] | Freq: Four times a day (QID) | OPHTHALMIC | 0 refills | Status: DC

## 2020-01-25 MED ORDER — LACTATED RINGERS IV BOLUS: 1000.0000 mL | Freq: Once | INTRAVENOUS | Status: DC

## 2020-01-25 MED ORDER — PROCHLORPERAZINE EDISYLATE 10 MG/2ML IJ SOLN
10.0000 mg | Freq: Once | INTRAMUSCULAR | Status: AC
  Administered 2020-01-25: 10 mg via INTRAMUSCULAR
  Filled 2020-01-25: qty 2

## 2020-01-25 MED ORDER — KETOROLAC TROMETHAMINE 60 MG/2ML IM SOLN
60.0000 mg | Freq: Once | INTRAMUSCULAR | Status: AC
  Administered 2020-01-25: 60 mg via INTRAMUSCULAR
  Filled 2020-01-25: qty 2

## 2020-01-25 MED ORDER — DIPHENHYDRAMINE HCL 50 MG/ML IJ SOLN: 25.0000 mg | Freq: Once | INTRAMUSCULAR | Status: DC

## 2020-01-25 MED ORDER — PROCHLORPERAZINE EDISYLATE 10 MG/2ML IJ SOLN: 10.0000 mg | Freq: Once | INTRAMUSCULAR | Status: DC

## 2020-01-25 MED ORDER — KETOROLAC TROMETHAMINE 30 MG/ML IJ SOLN: 30.0000 mg | Freq: Once | INTRAMUSCULAR | Status: DC

## 2020-01-25 NOTE — ED Notes (Signed)
IV attempted x's 3 without success 

## 2020-01-25 NOTE — Telephone Encounter (Signed)
Thank you :)

## 2020-01-25 NOTE — Discharge Instructions (Addendum)
Drink plenty of fluids.  Take acetaminophen or naproxen as needed for pain.  Return if you are having any problems.

## 2020-01-25 NOTE — Telephone Encounter (Signed)
Patients daughter called asking what to do because her mother, Jennifer [name], has Chills, her feet are cold, her back hurts, she is sweating, blood sugar is 104. She did not know if her mother has a temperature. They did not realize her mother has an appointment here today at 10:15 am. Her daughter said they will come her right away for her appointment.

## 2020-01-25 NOTE — ED Provider Notes (Signed)
[facility] EMERGENCY DEPARTMENT Provider Note   CSN: [id number] Arrival date & time: 01/25/20  1047     History Chief Complaint  Patient presents with  . Headache    Jennifer [name] is a 49 y.o. female.  The history is provided by the patient. A language interpreter was used.  Headache She has history of diabetes, hyperlipidemia, atrial fibrillation anticoagulated on apixaban, and comes in complaining of 2-day history of neck ache, heavy and feeling an achy feeling in her feet, generalized fatigue.  She has had some chills.  She denies rhinorrhea, sore throat, cough, vomiting, diarrhea.  She denies dysuria.  She has not had any sick contacts.  Past Medical History:  Diagnosis Date  . Anemia, iron deficiency   . Atrial fibrillation ([place])   . Blindness of left eye   . Decreased visual acuity    Left eye  . 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 Hair loss   . History of fracture of clavicle 05/18/2015  . Hyperlipidemia   . Iron deficiency anemia 05/13/2013  . Pain, dental 08/19/2018   Tooth pain/facial swelling: has poor dentition at baseline, history of dental abscess.  She has not seen a dentist in about one year.  Dentist is on [address].  No fevers chills or systemic symptoms.  Diabetes has been poorly controlled for some time.  She said it has improved recently averaging around 140.  Called the dentist said they would not see patients until May 14th but the urgency o  . Pap smear abnormality of cervix with LGSIL   . Routine/ritual circumcision   . Type II diabetes mellitus ([place])   . Uveitis     Patient Active Problem List   Diagnosis Date Noted  . Right ankle injury 09/28/2019  . Bilateral lower extremity edema 06/07/2019  . Atrial fibrillation ([place]) 09/20/2018  . Migraine   . Hematuria 08/24/2018  . Vulvovaginitis  05/21/2018  . (HFpEF) heart failure with preserved ejection fraction ([place]) 01/01/2018  . 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  . High risk medication use 09/16/2017  . Pseudophakia of right eye 09/16/2017  . Left anterior shoulder pain 07/06/2017  . Hypertension associated with diabetes ([place]) 07/23/2016  . 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 Fatigue 04/02/2016  . 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 Vitamin D deficiency 10/30/2014  . Hair loss 03/24/2012  . Healthcare maintenance 02/26/2011  . Hyperlipidemia 05/05/2006  . Type II diabetes mellitus ([place]) 04/22/1998    Past Surgical History:  Procedure Laterality Date  . CESAREAN SECTION       OB History    Gravida  7   Para  4   Term  4   Preterm      AB  3   Living  4     SAB  3   TAB      Ectopic      Multiple      Live Births              Family History  Problem Relation Age of Onset  . Diabetes Father     Social History   Tobacco Use  . Smoking status: Never Smoker  . Smokeless tobacco: Never Used  Vaping Use  . Vaping Use: Never used  Substance Use Topics  . Alcohol use: No  .  Drug use: No    Home Medications Prior to Admission medications   Medication Sig Start Date End Date Taking? Authorizing Provider  Accu-Chek FastClix Lancets MISC Check blood sugar 4 times a day 06/07/19   Jose Persia, MD  apixaban (ELIQUIS) 5 MG TABS tablet Take 1 tablet (5 mg total) by mouth 2 (two) times daily. 06/30/19   Jose Persia, MD  atorvastatin (LIPITOR) 40 MG tablet Take 1 tablet (40 mg total) by mouth daily. 06/30/19   Jose Persia, MD  Blood Glucose Monitoring Suppl (ACCU-CHEK GUIDE) w/Device KIT 1 each by Does not apply route 4 (four) times daily. 11/19/18   Jose Persia, MD  brimonidine (ALPHAGAN) 0.2 % ophthalmic solution Place 1 drop into the left eye 3 (three) times daily.     [provider]  dorzolamide-timolol (COSOPT) 22.3-6.8 MG/ML ophthalmic  solution Place 1 drop into the left eye 2 (two) times daily.    [provider]  Dulaglutide (TRULICITY) 0.10 OF/1.2RF SOPN Inject 0.5 mLs (0.75 mg total) into the skin once a week. 09/28/19   Asencion Noble, MD  furosemide (LASIX) 20 MG tablet Take 1 tablet (20 mg total) by mouth daily as needed (leg swelling). 06/07/19   Jose Persia, MD  glucose blood (ACCU-CHEK GUIDE) test strip Check blood sugar 4 times per day 06/07/19   Jose Persia, MD  insulin aspart protamine - aspart (NOVOLOG MIX 70/30 FLEXPEN) (70-30) 100 UNIT/ML FlexPen Inject 0.4 mLs (40 Units total) into the skin daily with breakfast AND 0.35 mLs (35 Units total) daily with supper. 09/28/19   Asencion Noble, MD  Insulin Pen Needle 31G X 5 MM MISC 1 Dose by Does not apply route 2 (two) times daily. 05/20/18   Alphonzo Grieve, MD  ketorolac (ACULAR) 0.5 % ophthalmic solution Place 1 drop into both eyes 4 (four) times daily.    [provider]  lisinopril (ZESTRIL) 10 MG tablet Take 1 tablet (10 mg total) by mouth daily. 06/30/19   Jose Persia, MD  metFORMIN (GLUCOPHAGE) 1000 MG tablet Take 1 tablet (1,000 mg total) by mouth 2 (two) times daily with a meal. 10/13/19   Jose Persia, MD  ferrous sulfate 325 (65 FE) MG tablet Take 1 tablet (325 mg total) by mouth daily. Patient not taking: Reported on 06/12/2018 04/29/17 05/15/19  Alphonzo Grieve, MD    Allergies    Patient has no known allergies.  Review of Systems   Review of Systems  Neurological: Positive for headaches.  All other systems reviewed and are negative.   Physical Exam Updated Vital Signs BP 103/67 (BP Location: Right Arm)   Pulse (!) 50   Temp 98.2 F (36.8 C) (Oral)   Resp 16   LMP 08/21/2016 (Exact Date)   SpO2 97%   Physical Exam Vitals and nursing note reviewed.   49 year old female, resting comfortably and in no acute distress. Vital signs are significant for slow heart rate. Oxygen saturation is 97%, which is normal. Head  is normocephalic and atraumatic.  Right pupil is round and reactive.  Left cornea is opacified.  EOMI. Oropharynx is clear. Neck is nontender and supple without adenopathy or JVD. Back is nontender and there is no CVA tenderness. Lungs are clear without rales, wheezes, or rhonchi. Chest is nontender. Heart has regular rate and rhythm without murmur. Abdomen is soft, flat, nontender without masses or hepatosplenomegaly and peristalsis is normoactive. Extremities have no cyanosis or edema, full range of motion is present. Skin is warm  and dry without rash. Neurologic: Mental status is normal, cranial nerves are intact, there are no motor deficits.  Decreased distal sensation in the feet consistent with diabetic peripheral neuropathy.  ED Results / Procedures / Treatments   Labs (all labs ordered are listed, but only abnormal results are displayed) Labs Reviewed  BASIC METABOLIC PANEL - Abnormal; Notable for the following components:      Result Value   Sodium 133 (*)    Glucose, Bld 294 (*)    BUN 21 (*)    All other components within normal limits  CBC - Abnormal; Notable for the following components:   Hemoglobin 11.8 (*)    All other components within normal limits  RESPIRATORY PANEL BY RT PCR (FLU A&B, COVID)  URINALYSIS, ROUTINE W REFLEX MICROSCOPIC  CBG MONITORING, ED    EKG None  Radiology No results found.  Procedures Procedures   Medications Ordered in ED Medications - No data to display  ED Course  I have reviewed the triage vital signs and the nursing notes.  Pertinent labs & imaging results that were available during my care of the patient were reviewed by me and considered in my medical decision making (see chart for details).  MDM Rules/Calculators/A&P Body aches, chills consistent with viral syndrome.  Swab for COVID-19 and influenza was negative for both.  Labs showed mild hyperglycemia, mild hyponatremia consistent with elevated sugar and glucose, mild  anemia not significantly changed from baseline.  Will give IV fluids, ketorolac, prochlorperazine.  Old records are reviewed, and she has no relevant past visits.    IV antibiotic be established, ketorolac and prochlorperazine were given intramuscularly with significant improvement.  Patient is discharged with instructions to use over-the-counter NSAIDs and acetaminophen, make sure she keeps herself well-hydrated.  She is complaining some irritation of her left eye, is prescribed ketorolac eyedrops.  Final Clinical Impression(s) / ED Diagnoses Final diagnoses:  Viral illness    Rx / DC Orders ED Discharge Orders         Ordered    ketorolac (ACULAR) 0.5 % ophthalmic solution  4 times daily        01/25/20 6283           Delora Fuel, MD 66/29/47 1911

## 2020-01-25 NOTE — ED Triage Notes (Signed)
Patient arrives to ED with complaints of chills, diaphoresis, fatigue, headache, and fevers starting this morning at 3am. No signs of UTI. Same symptoms in past and was related to her uncontrolled diabetes.

## 2020-01-25 NOTE — Telephone Encounter (Signed)
Talked to pt's daughter, Runell Gess (translating) - stated c/o h/a, chills, "body cold,feet feels heavy". Denies loss of taste,smell. Has not taken temperature. Agreed to take pt to the ED.; Dr Marva Panda aware.

## 2020-02-01 LAB — HM DIABETES EYE EXAM

## 2020-02-08 ENCOUNTER — Other Ambulatory Visit: Payer: Self-pay

## 2020-02-08 ENCOUNTER — Encounter: Payer: Self-pay | Admitting: Internal Medicine

## 2020-02-08 ENCOUNTER — Ambulatory Visit (INDEPENDENT_AMBULATORY_CARE_PROVIDER_SITE_OTHER): Payer: Medicaid Other | Admitting: Internal Medicine

## 2020-02-08 VITALS — BP 132/66 | HR 73 | Temp 98.2°F | Ht 64.0 in | Wt 202.6 lb

## 2020-02-08 DIAGNOSIS — I5032 Chronic diastolic (congestive) heart failure: Secondary | ICD-10-CM | POA: Diagnosis not present

## 2020-02-08 DIAGNOSIS — Z794 Long term (current) use of insulin: Secondary | ICD-10-CM | POA: Diagnosis not present

## 2020-02-08 DIAGNOSIS — R600 Localized edema: Secondary | ICD-10-CM | POA: Diagnosis not present

## 2020-02-08 DIAGNOSIS — E782 Mixed hyperlipidemia: Secondary | ICD-10-CM

## 2020-02-08 DIAGNOSIS — E1159 Type 2 diabetes mellitus with other circulatory complications: Secondary | ICD-10-CM | POA: Diagnosis not present

## 2020-02-08 DIAGNOSIS — I480 Paroxysmal atrial fibrillation: Secondary | ICD-10-CM | POA: Diagnosis not present

## 2020-02-08 DIAGNOSIS — I10 Essential (primary) hypertension: Secondary | ICD-10-CM | POA: Diagnosis not present

## 2020-02-08 DIAGNOSIS — E11319 Type 2 diabetes mellitus with unspecified diabetic retinopathy without macular edema: Secondary | ICD-10-CM | POA: Diagnosis not present

## 2020-02-08 DIAGNOSIS — I152 Hypertension secondary to endocrine disorders: Secondary | ICD-10-CM

## 2020-02-08 DIAGNOSIS — E785 Hyperlipidemia, unspecified: Secondary | ICD-10-CM

## 2020-02-08 LAB — POCT GLYCOSYLATED HEMOGLOBIN (HGB A1C): HbA1c POC (<> result, manual entry): >14 % — AB (ref 4.0–5.6)

## 2020-02-08 LAB — GLUCOSE, CAPILLARY: Glucose-Capillary: 280 mg/dL — ABNORMAL HIGH (ref 70–99)

## 2020-02-08 MED ORDER — INSULIN ASPART 100 UNIT/ML ~~LOC~~ SOLN: 10.0000 [IU] | Freq: Three times a day (TID) | SUBCUTANEOUS | 0 refills | Status: DC

## 2020-02-08 MED ORDER — TRULICITY 0.75 MG/0.5ML ~~LOC~~ SOAJ: 0.7500 mg | SUBCUTANEOUS | 4 refills | Status: DC

## 2020-02-08 MED ORDER — APIXABAN 5 MG PO TABS: 5.0000 mg | ORAL_TABLET | Freq: Two times a day (BID) | ORAL | 1 refills | Status: DC

## 2020-02-08 MED ORDER — INSULIN GLARGINE 100 UNIT/ML ~~LOC~~ SOLN: 30.0000 [IU] | Freq: Every day | SUBCUTANEOUS | 0 refills | Status: DC

## 2020-02-08 NOTE — Patient Instructions (Addendum)
Ms. Orchard:  It was a pleasure seeing you in clinic. Today we discussed:   Diabetes: Your A1c remains >14. At this time, I would like you to discontinue your current insulin regimen. I would like you to start taking Lantus 30U daily and Novolog 10U three times daily with meals.  Increase your metformin to 1000mg  two times daily. Start Trulicity 0.75mg  once weekly. Follow up in 2 weeks for blood sugar checks - please bring your meter to this appointment.  Leg swelling and heaviness: This is secondary to your diagnosis of heart failure. Start taking your furosemide 20mg  daily and use compression stockings. Elevate your legs as able.   Atrial fibrillation: Start taking Eliquis 5mg  twice daily  I would recommend getting the COVID vaccine. You may schedule this through the health department or [facility] pharmacy.  If you have any questions or concerns, please call our clinic at [phone] between 9am-5pm and after hours call [phone] and ask for the internal medicine resident on call. If you feel you are having a medical emergency please call 911.   Thank you, we look forward to helping you remain healthy!

## 2020-02-08 NOTE — Progress Notes (Signed)
   CC: diabetes f/u  HPI:  [name] is a 49 y.o. female with PMHx as listed below presenting for follow up of her diabetes. She notes that her feet "feel heavy" and is also requesting diabetic shoes. Please see problem based charting for complete assessment and plan.  Past Medical History:  Diagnosis Date  . Anemia, iron deficiency   . Atrial fibrillation ([place])   . Blindness of left eye   . Decreased visual acuity    Left eye  . 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 Hair loss   . History of fracture of clavicle 05/18/2015  . Hyperlipidemia   . Iron deficiency anemia 05/13/2013  . Pain, dental 08/19/2018   Tooth pain/facial swelling: has poor dentition at baseline, history of dental abscess.  She has not seen a dentist in about one year.  Dentist is on [address].  No fevers chills or systemic symptoms.  Diabetes has been poorly controlled for some time.  She said it has improved recently averaging around 140.  Called the dentist said they would not see patients until May 14th but the urgency o  . Pap smear abnormality of cervix with LGSIL   . Routine/ritual circumcision   . Type II diabetes mellitus ([place])   . Uveitis    Review of Systems:  Negative except as stated in HPI.  Physical Exam:  Vitals:   02/08/20 1610  BP: 132/66  Pulse: 73  Temp: 98.2 F (36.8 C)  TempSrc: Oral  SpO2: 99%  Weight: 202 lb 9.6 oz (91.9 kg)  Height: 5\' 4"  (1.626 m)   Physical Exam  Constitutional: Appears well-developed and well-nourished. No distress.  HENT: Normocephalic and atraumatic, EOMI, conjunctiva normal, moist mucous membranes Cardiovascular: Normal rate, regular rhythm, S1 and S2 present, no murmurs, rubs, gallops.  Distal pulses intact Respiratory: No respiratory distress, no accessory muscle use.  Effort is normal.  Lungs are clear to  auscultation bilaterally. Musculoskeletal: Normal bulk and tone.  2+ pitting edema to mid tibia in bilateral lower extremities  Neurological: Is alert and oriented x4, no apparent focal deficits noted. Skin: Warm and dry.  No rash, erythema, lesions noted. Psychiatric: Normal mood and affect. Behavior is normal. Judgment and thought content normal.    Assessment & Plan:   See Encounters Tab for problem based charting.  Patient discussed with Dr. Evette Doffing

## 2020-02-09 ENCOUNTER — Encounter: Payer: Self-pay | Admitting: Dietician

## 2020-02-09 ENCOUNTER — Other Ambulatory Visit: Payer: Self-pay | Admitting: Dietician

## 2020-02-09 DIAGNOSIS — E11319 Type 2 diabetes mellitus with unspecified diabetic retinopathy without macular edema: Secondary | ICD-10-CM

## 2020-02-09 NOTE — Assessment & Plan Note (Signed)
Patient has a history of HFpEF with prior TTE in May 2020 with EF 50-55% and impaired diastolic function. She has been on furosemide 20mg  daily as needed. However, patient has not been taking this. She endorses feeling of "heavy feet" when walking; however denies any recent falls. Denies any worsening paresthesias. On examination, has 2+ pitting edema to bilateral lower extremities with intact distal pulses.  Suspect her lower extremity edema is in setting of HFpEF with medication noncompliance. Explained to patient and daughter regarding compression stockings and taking her lasix 20mg  daily.   Plan: - Furosemide 20mg  daily  - Compression stockings - F/u in 2 weeks for BMP

## 2020-02-09 NOTE — Progress Notes (Signed)
Referral to [facility] for Diabetes Self Management Education & Support classes per Dr. Marva Panda

## 2020-02-09 NOTE — Assessment & Plan Note (Signed)
Patient has a history of atrial fibrillation with CHADS2-VASC score 4 and recommended for anti-coagulation. At her last visit, patient was instructed to start Eliquis 5mg  twice daily; however, patient has not picked up this medication. Per daughter, patient was instructed that she once had an irregular rhythm but this was no longer the case. Extensive discussion regarding atrial fibrillation and that, despite currently being in sinus rhythm, patient still has a high risk of stroke/embolism. Patient's daughter expresses understanding and patient and daughter will work on having patient take her Eliquis.  Plan: - Eliquis 5mg  bid - At next visit, will discuss follow up with cardiology

## 2020-02-09 NOTE — Assessment & Plan Note (Addendum)
Jennifer [name] has a history of poorly controlled diabetes with HbA1c of >14 since 2018 (and >10 prior to that). On her prior visit, patient was instructed to take Novolog 70/30 40units in AM and 35U in PM with Metformin 1000mg  bid and Trulicity weekly. However, patient has been noncompliant with current regimen and daughter is also reporting large amounts of processed sugar consumption. She endorses that she checks her blood sugars at home and have usually ranged 250-400. Did not bring her meter to this appointment.  We extensively discussed patients medications during this visit and advised her to increase her Metformin to 1000mg  twice daily. She has not been taking her Trulicity over the past month and although she endorses compliance with her Novolog 70/30 regimen, review of medications with evidence that patient has not picked up her prescription over the past several months.   Plan: At this time will discontinue the Novolog 70/30 mix as patient is likely not taking this. Will start on basal-bolus regimen:  - Lantus 30U daily with Novolog 10U tid with meals  - Continue Metformin 1000mg  bid  - Trulicity 0.75mg  weekly refilled - Continue blood glucose monitoring - will benefit from possibly getting CGM again at next visit  - Daughter is requesting diabetes education classes; will have [name] refer - Follow up in 2 weeks for insulin titration

## 2020-02-09 NOTE — Assessment & Plan Note (Signed)
Lipid Panel     Component Value Date/Time   CHOL 222 (H) 06/07/2019 1549   TRIG 126 06/07/2019 1549   HDL 56 06/07/2019 1549   CHOLHDL 4.0 06/07/2019 1549   CHOLHDL 3.3 02/18/2014 1653   VLDL 17 02/18/2014 1653   LDLCALC 144 (H) 06/07/2019 1549   LABVLDL 22 06/07/2019 1549   ASCVD risk 8.4%. Patient is prescribed atorvastatin 40mg  daily; however, she has not been taking this. Discussed with patient and daughter and recommended to take her lipitor to decrease risk of stroke/MI in next 10 years.   Plan: Continue Atorvastatin 40mg  daily

## 2020-02-09 NOTE — Assessment & Plan Note (Signed)
Likely secondary to medication noncompliance in setting of HFpEF. Encouraged patient to take her lasix 20mg  daily prn and wear compression stockings.  - F/u in 2 weeks

## 2020-02-10 NOTE — Progress Notes (Signed)
Internal Medicine Clinic Attending ° °Case discussed with Dr. Aslam  At the time of the visit.  We reviewed the resident’s history and exam and pertinent patient test results.  I agree with the assessment, diagnosis, and plan of care documented in the resident’s note.  °

## 2020-03-06 ENCOUNTER — Ambulatory Visit: Payer: Medicaid Other | Admitting: Registered"

## 2020-03-08 ENCOUNTER — Emergency Department (HOSPITAL_COMMUNITY): Payer: Medicaid Other

## 2020-03-08 ENCOUNTER — Other Ambulatory Visit: Payer: Self-pay

## 2020-03-08 ENCOUNTER — Encounter: Payer: Medicaid Other | Attending: Internal Medicine | Admitting: Registered"

## 2020-03-08 ENCOUNTER — Emergency Department (HOSPITAL_COMMUNITY)
Admission: EM | Admit: 2020-03-08 | Discharge: 2020-03-08 | Disposition: A | Discharge status: Home / Self Care | Payer: Medicaid Other | Attending: Emergency Medicine | Admitting: Emergency Medicine

## 2020-03-08 DIAGNOSIS — R079 Chest pain, unspecified: Secondary | ICD-10-CM | POA: Insufficient documentation

## 2020-03-08 DIAGNOSIS — Z79899 Other long term (current) drug therapy: Secondary | ICD-10-CM | POA: Insufficient documentation

## 2020-03-08 DIAGNOSIS — W19XXXA Unspecified fall, initial encounter: Secondary | ICD-10-CM

## 2020-03-08 DIAGNOSIS — M25552 Pain in left hip: Secondary | ICD-10-CM | POA: Insufficient documentation

## 2020-03-08 DIAGNOSIS — Z794 Long term (current) use of insulin: Secondary | ICD-10-CM | POA: Insufficient documentation

## 2020-03-08 DIAGNOSIS — Y93E1 Activity, personal bathing and showering: Secondary | ICD-10-CM | POA: Insufficient documentation

## 2020-03-08 DIAGNOSIS — Y999 Unspecified external cause status: Secondary | ICD-10-CM | POA: Insufficient documentation

## 2020-03-08 DIAGNOSIS — Y929 Unspecified place or not applicable: Secondary | ICD-10-CM | POA: Diagnosis not present

## 2020-03-08 DIAGNOSIS — Z7984 Long term (current) use of oral hypoglycemic drugs: Secondary | ICD-10-CM | POA: Diagnosis not present

## 2020-03-08 DIAGNOSIS — R519 Headache, unspecified: Secondary | ICD-10-CM | POA: Diagnosis present

## 2020-03-08 DIAGNOSIS — R52 Pain, unspecified: Secondary | ICD-10-CM

## 2020-03-08 DIAGNOSIS — W182XXA Fall in (into) shower or empty bathtub, initial encounter: Secondary | ICD-10-CM | POA: Diagnosis not present

## 2020-03-08 DIAGNOSIS — M542 Cervicalgia: Secondary | ICD-10-CM | POA: Insufficient documentation

## 2020-03-08 DIAGNOSIS — M25512 Pain in left shoulder: Secondary | ICD-10-CM | POA: Insufficient documentation

## 2020-03-08 DIAGNOSIS — E119 Type 2 diabetes mellitus without complications: Secondary | ICD-10-CM | POA: Diagnosis not present

## 2020-03-08 DIAGNOSIS — Z7901 Long term (current) use of anticoagulants: Secondary | ICD-10-CM | POA: Insufficient documentation

## 2020-03-08 DIAGNOSIS — R1084 Generalized abdominal pain: Secondary | ICD-10-CM | POA: Insufficient documentation

## 2020-03-08 LAB — URINALYSIS, ROUTINE W REFLEX MICROSCOPIC
Bilirubin Urine: NEGATIVE
Glucose, UA: >=500 mg/dL — AB
Ketones, ur: 20 mg/dL — AB
Leukocytes,Ua: NEGATIVE
Nitrite: NEGATIVE
Protein, ur: 100 mg/dL — AB
Specific Gravity, Urine: 1.021 (ref 1.005–1.030)
pH: 5 (ref 5.0–8.0)

## 2020-03-08 LAB — CBC WITH DIFFERENTIAL/PLATELET
Abs Immature Granulocytes: 0.02 10*3/uL (ref 0.00–0.07)
Basophils Absolute: 0 10*3/uL (ref 0.0–0.1)
Basophils Relative: 0 %
Eosinophils Absolute: 0.1 10*3/uL (ref 0.0–0.5)
Eosinophils Relative: 1 %
HCT: 39.1 % (ref 36.0–46.0)
Hemoglobin: 12.3 g/dL (ref 12.0–15.0)
Immature Granulocytes: 0 %
Lymphocytes Relative: 16 %
Lymphs Abs: 0.7 10*3/uL (ref 0.7–4.0)
MCH: 26.9 pg (ref 26.0–34.0)
MCHC: 31.5 g/dL (ref 30.0–36.0)
MCV: 85.6 fL (ref 80.0–100.0)
Monocytes Absolute: 0.3 10*3/uL (ref 0.1–1.0)
Monocytes Relative: 6 %
Neutro Abs: 3.5 10*3/uL (ref 1.7–7.7)
Neutrophils Relative %: 77 %
Platelets: 159 10*3/uL (ref 150–400)
RBC: 4.57 MIL/uL (ref 3.87–5.11)
RDW: 11.9 % (ref 11.5–15.5)
WBC: 4.6 10*3/uL (ref 4.0–10.5)
nRBC: 0 % (ref 0.0–0.2)

## 2020-03-08 LAB — COMPREHENSIVE METABOLIC PANEL
ALT: 13 U/L (ref 0–44)
AST: 16 U/L (ref 15–41)
Albumin: 3.2 g/dL — ABNORMAL LOW (ref 3.5–5.0)
Alkaline Phosphatase: 112 U/L (ref 38–126)
Anion gap: 13 (ref 5–15)
BUN: 17 mg/dL (ref 6–20)
CO2: 22 mmol/L (ref 22–32)
Calcium: 9.4 mg/dL (ref 8.9–10.3)
Chloride: 97 mmol/L — ABNORMAL LOW (ref 98–111)
Creatinine, Ser: 1 mg/dL (ref 0.44–1.00)
GFR, Estimated: >60 mL/min (ref >60)
Glucose, Bld: 416 mg/dL — ABNORMAL HIGH (ref 70–99)
Potassium: 4.2 mmol/L (ref 3.5–5.1)
Sodium: 132 mmol/L — ABNORMAL LOW (ref 135–145)
Total Bilirubin: 0.7 mg/dL (ref 0.3–1.2)
Total Protein: 7.2 g/dL (ref 6.5–8.1)

## 2020-03-08 LAB — CK: Total CK: 30 U/L — ABNORMAL LOW (ref 38–234)

## 2020-03-08 LAB — CBG MONITORING, ED: Glucose-Capillary: 323 mg/dL — ABNORMAL HIGH (ref 70–99)

## 2020-03-08 MED ORDER — MORPHINE SULFATE (PF) 4 MG/ML IV SOLN
4.0000 mg | Freq: Once | INTRAVENOUS | Status: AC
  Administered 2020-03-08: 4 mg via INTRAVENOUS
  Filled 2020-03-08: qty 1

## 2020-03-08 MED ORDER — IOHEXOL 300 MG/ML  SOLN
100.0000 mL | Freq: Once | INTRAMUSCULAR | Status: AC | PRN
  Administered 2020-03-08: 100 mL via INTRAVENOUS

## 2020-03-08 MED ORDER — ACETAMINOPHEN 325 MG PO TABS
650.0000 mg | ORAL_TABLET | Freq: Once | ORAL | Status: AC
  Administered 2020-03-08: 650 mg via ORAL
  Filled 2020-03-08: qty 2

## 2020-03-08 MED ORDER — ONDANSETRON HCL 4 MG/2ML IJ SOLN
4.0000 mg | Freq: Once | INTRAMUSCULAR | Status: AC
  Administered 2020-03-08: 4 mg via INTRAVENOUS
  Filled 2020-03-08: qty 2

## 2020-03-08 MED ORDER — SODIUM CHLORIDE 0.9 % IV BOLUS
1000.0000 mL | Freq: Once | INTRAVENOUS | Status: AC
  Administered 2020-03-08: 1000 mL via INTRAVENOUS

## 2020-03-08 NOTE — ED Notes (Signed)
Got patient on the monitor did ekg shwon to Dr Jennifer [name] patient is resting with nurse at bedside and call bell in reach

## 2020-03-08 NOTE — ED Triage Notes (Addendum)
Patient arrived via EMS after falling in the shower. Patient states it was slippery and fell yesterday. Now, patient states she has pain in her neck, low back and right sided rib pain and left shoulder pain. Pt also states dizziness after fall. Denies LOC. CBG 464

## 2020-03-08 NOTE — ED Provider Notes (Signed)
[facility] EMERGENCY DEPARTMENT Provider Note   CSN: [id number] Arrival date & time: 03/08/20  1218     History Chief Complaint  Patient presents with  . Fall    Jennifer [name] is a 49 y.o. female with past medical history significant for iron deficiency anemia, A. Fib, blindness of left eye, hyperlipidemia, type 2 diabetes.  HPI Patient presents to emergency room today via EMS with chief complaint of falling in the shower x1 day ago.  She states around 8 PM last night she was in the shower and she slipped and fell.  She hit the left side of her head on the side of the bed.  She denies loss of consciousness.  She states she was unable to get up for approximately 1 hour because she was in so much pain.  She is endorsing stabbing pain in her neck, chest, left shoulder, abdomen and back.  Pain is worse with movement.  She rates the pain 8 out of 10 in severity.  She not take anything for pain prior to arrival.  She does admit to feeling dizzy after the fall.  When describing it she says she has had intermittent lightheadedness for the last week.  She did not feel dizzy or lightheaded prior to her fall.  She does also report a headache.  She states the headache has progressively worsened since onset.  She denies any fever, chills, visual changes, bowel or bladder incontinence, urinary retention, numbness, tingling, weakness.   Due to language barrier, a video interpreter was present during the history-taking and subsequent discussion (and for part of the physical exam) with this patient.      Past Medical History:  Diagnosis Date  . Anemia, iron deficiency   . Atrial fibrillation ([place])   . Blindness of left eye   . Decreased visual acuity    Left eye  . 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 Hair loss   . History of fracture of  clavicle 05/18/2015  . Hyperlipidemia   . Iron deficiency anemia 05/13/2013  . Pain, dental 08/19/2018   Tooth pain/facial swelling: has poor dentition at baseline, history of dental abscess.  She has not seen a dentist in about one year.  Dentist is on [address].  No fevers chills or systemic symptoms.  Diabetes has been poorly controlled for some time.  She said it has improved recently averaging around 140.  Called the dentist said they would not see patients until May 14th but the urgency o  . Pap smear abnormality of cervix with LGSIL   . Routine/ritual circumcision   . Type II diabetes mellitus ([place])   . Uveitis     Patient Active Problem List   Diagnosis Date Noted  . Right ankle injury 09/28/2019  . Bilateral lower extremity edema 06/07/2019  . Atrial fibrillation ([place]) 09/20/2018  . Migraine   . Hematuria 08/24/2018  . Vulvovaginitis 05/21/2018  . (HFpEF) heart failure with preserved ejection fraction ([place]) 01/01/2018  . 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  . High risk medication use 09/16/2017  . Pseudophakia of right eye 09/16/2017  . Left anterior shoulder pain 07/06/2017  . Hypertension associated with diabetes ([place]) 07/23/2016  . 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 Fatigue 04/02/2016  . 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 Vitamin D deficiency 10/30/2014  .  Hair loss 03/24/2012  . Healthcare maintenance 02/26/2011  . Hyperlipidemia 05/05/2006  . Type II diabetes mellitus ([place]) 04/22/1998    Past Surgical History:  Procedure Laterality Date  . CESAREAN SECTION       OB History    Gravida  7   Para  4   Term  4   Preterm      AB  3   Living  4     SAB  3   TAB      Ectopic      Multiple      Live Births              Family History  Problem Relation Age of Onset  . Diabetes Father     Social History   Tobacco Use  . Smoking status: Never Smoker  . Smokeless tobacco: Never Used  Vaping Use  . Vaping  Use: Never used  Substance Use Topics  . Alcohol use: No  . Drug use: No    Home Medications Prior to Admission medications   Medication Sig Start Date End Date Taking? Authorizing Provider  acetaminophen (TYLENOL) 500 MG tablet Take 500 mg by mouth every 6 (six) hours as needed for mild pain.   Yes [provider]  atorvastatin (LIPITOR) 40 MG tablet Take 1 tablet (40 mg total) by mouth daily. 06/30/19  Yes Jose Persia, MD  dorzolamide-timolol (COSOPT) 22.3-6.8 MG/ML ophthalmic solution Place 1 drop into the left eye 2 (two) times daily.   Yes [provider]  Dulaglutide (TRULICITY) 2.87 OM/7.6HM SOPN Inject 0.75 mg into the skin once a week. 02/08/20  Yes Aslam, Loralyn Freshwater, MD  furosemide (LASIX) 20 MG tablet Take 1 tablet (20 mg total) by mouth daily as needed (leg swelling). 06/07/19  Yes Jose Persia, MD  insulin aspart (NOVOLOG) 100 UNIT/ML injection Inject 10 Units into the skin 3 (three) times daily with meals. 02/08/20 03/09/20 Yes Aslam, Loralyn Freshwater, MD  insulin glargine (LANTUS) 100 UNIT/ML injection Inject 0.3 mLs (30 Units total) into the skin daily. 02/08/20 03/09/20 Yes Aslam, Loralyn Freshwater, MD  ketorolac (ACULAR) 0.5 % ophthalmic solution Place 1 drop into the left eye 4 (four) times daily. 12/24/68  Yes Delora Fuel, MD  metFORMIN (GLUCOPHAGE) 1000 MG tablet Take 1 tablet (1,000 mg total) by mouth 2 (two) times daily with a meal. 10/13/19  Yes Jose Persia, MD  Accu-Chek FastClix Lancets MISC Check blood sugar 4 times a day 06/07/19   Jose Persia, MD  apixaban (ELIQUIS) 5 MG TABS tablet Take 1 tablet (5 mg total) by mouth 2 (two) times daily. Patient not taking: Reported on 03/08/2020 02/08/20   Harvie Heck, MD  Blood Glucose Monitoring Suppl (ACCU-CHEK GUIDE) w/Device KIT 1 each by Does not apply route 4 (four) times daily. 11/19/18   Jose Persia, MD  brimonidine (ALPHAGAN) 0.2 % ophthalmic solution Place 1 drop into the left eye 3 (three) times daily.  Patient  not taking: Reported on 01/25/2020    [provider]  glucose blood (ACCU-CHEK GUIDE) test strip Check blood sugar 4 times per day 06/07/19   Jose Persia, MD  ibuprofen (ADVIL) 200 MG tablet Take 400 mg by mouth every 6 (six) hours as needed for headache. Patient not taking: Reported on 03/08/2020    [provider]  Insulin Pen Needle 31G X 5 MM MISC 1 Dose by Does not apply route 2 (two) times daily. 05/20/18   Alphonzo Grieve, MD  lisinopril (ZESTRIL) 10 MG tablet  Take 1 tablet (10 mg total) by mouth daily. Patient not taking: Reported on 03/08/2020 06/30/19   Jose Persia, MD  ferrous sulfate 325 (65 FE) MG tablet Take 1 tablet (325 mg total) by mouth daily. Patient not taking: Reported on 06/12/2018 04/29/17 05/15/19  Alphonzo Grieve, MD    Allergies    Patient has no known allergies.  Review of Systems   Review of Systems All other systems are reviewed and are negative for acute change except as noted in the HPI.  Physical Exam Updated Vital Signs BP (!) 151/77 (BP Location: Right Arm)   Pulse 88   Temp 99.3 F (37.4 C)   Resp 18   LMP 08/21/2016 (Exact Date)   SpO2 96%   Physical Exam Vitals and nursing note reviewed.  Constitutional:      General: She is not in acute distress.    Appearance: She is not ill-appearing.  HENT:     Head: Normocephalic. No raccoon eyes or Battle's sign.     Jaw: There is normal jaw occlusion.      Comments: Tenderness depicted in image above. No deformities or crepitus noted. No open wounds, abrasions or lacerations.     Right Ear: Tympanic membrane and external ear normal. No hemotympanum.     Left Ear: Tympanic membrane and external ear normal. No hemotympanum.     Nose: Nose normal.     Right Nostril: No epistaxis or septal hematoma.     Left Nostril: No epistaxis or septal hematoma.     Mouth/Throat:     Mouth: Mucous membranes are moist.     Pharynx: Oropharynx is clear.  Eyes:     General: No scleral icterus.        Right eye: No discharge.        Left eye: No discharge.     Extraocular Movements: Extraocular movements intact.     Conjunctiva/sclera: Conjunctivae normal.     Pupils: Pupils are equal, round, and reactive to light.     Comments: Pupils round and reactive.  Left cornea is opacified.  This is baseline per patient.  Neck:     Vascular: No JVD.     Comments: Wearing cervical collar, unable to assess ROM. Cardiovascular:     Rate and Rhythm: Normal rate and regular rhythm.     Pulses: Normal pulses.          Radial pulses are 2+ on the right side and 2+ on the left side.     Heart sounds: Normal heart sounds.  Pulmonary:     Comments: Lungs clear to auscultation in all fields. Symmetric chest rise. No wheezing, rales, or rhonchi. Chest:       Comments: Left anterior chest wall tenderness as depicted in image above.  No deformity or crepitus noted.  No evidence of flail chest.  Abdominal:     Comments: Abdomen is soft, non-distended. Generalized tenderness. No rigidity, no guarding. No peritoneal signs.  Musculoskeletal:        General: Normal range of motion.  Skin:    General: Skin is warm and dry.     Capillary Refill: Capillary refill takes less than 2 seconds.  Neurological:     Mental Status: She is oriented to person, place, and time.     GCS: GCS eye subscore is 4. GCS verbal subscore is 5. GCS motor subscore is 6.     Comments: Fluent speech, no facial droop.  Speech is clear and goal oriented, follows  commands CN III-XII intact, no facial droop Normal strength in upper and lower extremities bilaterally including dorsiflexion and plantar flexion, strong and equal grip strength Sensation normal to light and sharp touch Moves extremities without ataxia, coordination intact Normal finger to nose and rapid alternating movements   Psychiatric:        Behavior: Behavior normal.     ED Results / Procedures / Treatments   Labs (all labs ordered are listed, but only  abnormal results are displayed) Labs Reviewed  COMPREHENSIVE METABOLIC PANEL - Abnormal; Notable for the following components:      Result Value   Sodium 132 (*)    Chloride 97 (*)    Glucose, Bld 416 (*)    Albumin 3.2 (*)    All other components within normal limits  CK - Abnormal; Notable for the following components:   Total CK 30 (*)    All other components within normal limits  URINALYSIS, ROUTINE W REFLEX MICROSCOPIC - Abnormal; Notable for the following components:   APPearance HAZY (*)    Glucose, UA >=500 (*)    Hgb urine dipstick MODERATE (*)    Ketones, ur 20 (*)    Protein, ur 100 (*)    Bacteria, UA MANY (*)    All other components within normal limits  CBG MONITORING, ED - Abnormal; Notable for the following components:   Glucose-Capillary 323 (*)    All other components within normal limits  CBC WITH DIFFERENTIAL/PLATELET    EKG EKG Interpretation  Date/Time:  Wednesday March 08 2020 12:27:27 EST Ventricular Rate:  87 PR Interval:    QRS Duration: 91 QT Interval:  373 QTC Calculation: 449 R Axis:   -3 Text Interpretation: Sinus rhythm Low voltage, precordial leads no sig change from previous Confirmed by Charlesetta Shanks [phone]) on 03/08/2020 2:27:47 PM   Radiology DG Ribs Unilateral W/Chest Left  Result Date: 03/08/2020 CLINICAL DATA:  Left rib pain after fall EXAM: LEFT RIBS AND CHEST - 3+ VIEW COMPARISON:  08/10/2015 FINDINGS: No fracture or other bone lesions are seen involving the ribs. There is no evidence of pneumothorax or pleural effusion. Both lungs are clear. Heart size and mediastinal contours are within normal limits. IMPRESSION: Negative. Electronically Signed   By: Davina Poke D.O.   On: 03/08/2020 13:47   CT Head Wo Contrast  Result Date: 03/08/2020 CLINICAL DATA:  49 year old female with trauma. EXAM: CT HEAD WITHOUT CONTRAST CT CERVICAL SPINE WITHOUT CONTRAST TECHNIQUE: Multidetector CT imaging of the head and cervical spine was  performed following the standard protocol without intravenous contrast. Multiplanar CT image reconstructions of the cervical spine were also generated. COMPARISON:  Head CT dated 09/19/2018. FINDINGS: CT HEAD FINDINGS Brain: The ventricles and sulci appropriate size for patient's age. The gray-white matter discrimination is preserved. There is no acute intracranial hemorrhage. No mass effect midline shift no extra-axial fluid collection. Vascular: No hyperdense vessel or unexpected calcification. Skull: Normal. Negative for fracture or focal lesion. Sinuses/Orbits: There is diffuse mucoperiosteal thickening of paranasal sinuses. No air-fluid level. Mastoid air cells are clear. Other: None CT CERVICAL SPINE FINDINGS Alignment: No acute subluxation. Skull base and vertebrae: No acute fracture. Soft tissues and spinal canal: No prevertebral fluid or swelling. No visible canal hematoma. Disc levels:  No acute findings. Upper chest: Diffuse interstitial prominence may represent edema or atelectasis. Other: None IMPRESSION: 1. No acute intracranial pathology. 2. No acute/traumatic cervical spine pathology. Electronically Signed   By: Anner Crete M.D.   On: 03/08/2020  17:12   CT Cervical Spine Wo Contrast  Result Date: 03/08/2020 CLINICAL DATA:  49 year old female with trauma. EXAM: CT HEAD WITHOUT CONTRAST CT CERVICAL SPINE WITHOUT CONTRAST TECHNIQUE: Multidetector CT imaging of the head and cervical spine was performed following the standard protocol without intravenous contrast. Multiplanar CT image reconstructions of the cervical spine were also generated. COMPARISON:  Head CT dated 09/19/2018. FINDINGS: CT HEAD FINDINGS Brain: The ventricles and sulci appropriate size for patient's age. The gray-white matter discrimination is preserved. There is no acute intracranial hemorrhage. No mass effect midline shift no extra-axial fluid collection. Vascular: No hyperdense vessel or unexpected calcification. Skull:  Normal. Negative for fracture or focal lesion. Sinuses/Orbits: There is diffuse mucoperiosteal thickening of paranasal sinuses. No air-fluid level. Mastoid air cells are clear. Other: None CT CERVICAL SPINE FINDINGS Alignment: No acute subluxation. Skull base and vertebrae: No acute fracture. Soft tissues and spinal canal: No prevertebral fluid or swelling. No visible canal hematoma. Disc levels:  No acute findings. Upper chest: Diffuse interstitial prominence may represent edema or atelectasis. Other: None IMPRESSION: 1. No acute intracranial pathology. 2. No acute/traumatic cervical spine pathology. Electronically Signed   By: Anner Crete M.D.   On: 03/08/2020 17:12   CT ABDOMEN PELVIS W CONTRAST  Result Date: 03/08/2020 CLINICAL DATA:  Patient fell in the shower yesterday. Patient complaining of neck, low back and right-sided chest wall pain as well as left shoulder pain. EXAM: CT ABDOMEN AND PELVIS WITH CONTRAST TECHNIQUE: Multidetector CT imaging of the abdomen and pelvis was performed using the standard protocol following bolus administration of intravenous contrast. CONTRAST:  121m OMNIPAQUE IOHEXOL 300 MG/ML  SOLN COMPARISON:  03/28/2013 FINDINGS: Lower chest: Clear lung bases.  Heart normal in size. Hepatobiliary: No focal liver abnormality is seen. No contusion or laceration. No gallstones, gallbladder wall thickening, or biliary dilatation. Pancreas: Unremarkable. No pancreatic ductal dilatation or surrounding inflammatory changes. Spleen: Normal in size without focal abnormality. Adrenals/Urinary Tract: No adrenal mass or hemorrhage. Kidneys normal size, orientation and position with symmetric enhancement and excretion. No contusion, laceration, mass, stone or hydronephrosis. Normal ureters. Normal bladder. Stomach/Bowel: Normal stomach, small bowel and colon. No evidence of bowel or mesenteric injury. Normal appendix visualized. Vascular/Lymphatic: No vascular injury or significant  abnormality. No enlarged lymph nodes. Reproductive: Uterus and bilateral adnexa are unremarkable. Other: No abdominal wall hernia or contusion.  No ascites. Musculoskeletal: No fracture.  No bone lesion. IMPRESSION: 1. Negative exam. No acute findings within the abdomen or pelvis. Specifically, no evidence of injury. Electronically Signed   By: DLajean ManesM.D.   On: 03/08/2020 17:15   DG Shoulder Left  Result Date: 03/08/2020 CLINICAL DATA:  [place] shower yesterday.  Left shoulder pain. EXAM: LEFT SHOULDER - 2+ VIEW COMPARISON:  None. FINDINGS: No fracture, bone lesion or dislocation. Minor AC joint osteoarthritis. Glenohumeral joint normally spaced and aligned with no arthropathic changes. Soft tissues are unremarkable. IMPRESSION: No fracture or dislocation. Electronically Signed   By: DLajean ManesM.D.   On: 03/08/2020 13:47   DG HIP UNILAT WITH PELVIS 2-3 VIEWS LEFT  Result Date: 03/08/2020 CLINICAL DATA:  [place] shower yesterday.  Left hip pain. EXAM: DG HIP (WITH OR WITHOUT PELVIS) 2-3V LEFT COMPARISON:  None. FINDINGS: No fracture or bone lesion. Hip joint is normally spaced and aligned. No significant arthropathic change. Calcification is noted along the femoral arteries. Soft tissues otherwise unremarkable. IMPRESSION: No fracture, bone lesion or hip joint abnormality. Electronically Signed   By: DLajean Manes  M.D.   On: 03/08/2020 13:46   DG HIP UNILAT WITH PELVIS 2-3 VIEWS RIGHT  Result Date: 03/08/2020 CLINICAL DATA:  [place] in shower yesterday. Pain, predominant left hip pain. EXAM: DG HIP (WITH OR WITHOUT PELVIS) 2-3V RIGHT COMPARISON:  None. FINDINGS: No fracture or bone lesion. Hip joints, SI joints and symphysis pubis are normally spaced and aligned. There are bilateral femoral artery vascular calcifications. Soft tissues otherwise unremarkable. IMPRESSION: No fracture or dislocation. No significant hip joint arthropathic change. Electronically Signed   By: Lajean Manes M.D.    On: 03/08/2020 13:50    Procedures Procedures (including critical care time)  Medications Ordered in ED Medications  sodium chloride 0.9 % bolus 1,000 mL (0 mLs Intravenous Stopped 03/08/20 1454)  morphine 4 MG/ML injection 4 mg (4 mg Intravenous Given 03/08/20 1334)  ondansetron (ZOFRAN) injection 4 mg (4 mg Intravenous Given 03/08/20 1333)  iohexol (OMNIPAQUE) 300 MG/ML solution 100 mL (100 mLs Intravenous Contrast Given 03/08/20 1620)  acetaminophen (TYLENOL) tablet 650 mg (650 mg Oral Given 03/08/20 1823)    ED Course  I have reviewed the triage vital signs and the nursing notes.  Pertinent labs & imaging results that were available during my care of the patient were reviewed by me and considered in my medical decision making (see chart for details).    MDM Rules/Calculators/A&P                          History provided by patient and EMS with additional history obtained from chart review.    Patient seen and examined. Patient presents awake, alert, hemodynamically stable, afebrile, non toxic.  Patient is difficult to get history from as she is also acting in detail about her chronic pain. She arrives wearing cervical collar. No obvious head or back injury. Has tenderness palpation of right side of her head without any open wounds, crepitus or deformity.  Also has tenderness palpation of left chest without any deformity, crepitus or evidence of flail chest.  She has generalized abdominal tenderness with voluntary guarding.  Neuro exam without focal deficit.  CBG for EMS was in the 400s.   EKG without ischemic changes. CBC without leukocytosis, no anemia. CMP confirms elevated glucose of 416, no significant electrolyte derangement, no renal insufficiency.  No evidence to suggest DKA.  CK level of 30 does not indicate rhabdomyolysis. UA without signs of infection. I have personally viewed images and agree with radiology report. CT head and cervical spine for any acute traumatic  findings.  I went in to remove patient cervical collar and she had already done so.  She was up and walking around the room.  She had steady gait and was ambulatory without any difficulty. Xray of left shoulder, left ribs, pelvis with bilateral hips are negative for any fracture or dislocations. CT AP also negative for any acute findings, nothing to suggest acute surgical abdomen. When vitals rechecked, temperature 100, tylenol given. She does not want to stay for temperature to be rechecked.  The patient appears reasonably screened and/or stabilized for discharge and I doubt any other medical condition or other [facility] requiring further screening, evaluation, or treatment in the ED at this time prior to discharge. The patient is safe for discharge with strict return precautions discussed. Recommend pcp follow up for symptom recheck.   Portions of this note were generated with [employer]. Dictation errors may occur despite best attempts at proofreading.    Final Clinical  Impression(s) / ED Diagnoses Final diagnoses:  Fall, initial encounter    Rx / DC Orders ED Discharge Orders    None       Lewanda Rife 03/08/20 1830    Charlesetta Shanks, MD 03/12/20 [phone]

## 2020-03-08 NOTE — Discharge Instructions (Addendum)
The CT scan of your head and neck and the xrays of your shoulder, ribs and pelvis are all normal.  Continue your home medications including your diabetes medicines.  Follow up with your primary care doctor for symptom recheck in 1-2 days.

## 2020-03-08 NOTE — ED Notes (Signed)
Patient transported to CT 

## 2020-03-09 ENCOUNTER — Encounter: Payer: Self-pay | Admitting: Dietician

## 2020-03-09 ENCOUNTER — Telehealth: Payer: Self-pay | Admitting: Dietician

## 2020-03-09 NOTE — Telephone Encounter (Signed)
Tried calling this patient's daughter Runell Gess.   Her voicemail box is not set up. I was unable to leave a message. Will mail a letter.

## 2020-03-09 NOTE — Telephone Encounter (Signed)
Email Communication with [facility]:  "We do not schedule patients who need interpreters or translation in any form in classes at [place]. The daughter would not be able to sit in class for her mother as we would not be able to bill for patient education since we wouldn't actually be educating the patient. "  I will let daughter know and inform her about the free nutrition and diabetes classes at [facility] on Dec 9th.

## 2020-03-09 NOTE — Telephone Encounter (Signed)
Communication with [facility] about patient and daughter missing 2 appointments, after 3rd no shows they do not allow them to schedule again. Also to schedule them for diabetes classes, not 1:1s.

## 2020-03-10 ENCOUNTER — Encounter: Payer: Self-pay | Admitting: Internal Medicine

## 2020-03-10 ENCOUNTER — Ambulatory Visit (INDEPENDENT_AMBULATORY_CARE_PROVIDER_SITE_OTHER): Payer: Medicaid Other | Admitting: Internal Medicine

## 2020-03-10 ENCOUNTER — Other Ambulatory Visit: Payer: Self-pay

## 2020-03-10 VITALS — BP 154/88 | HR 85 | Temp 97.6°F | Ht 66.0 in

## 2020-03-10 DIAGNOSIS — I959 Hypotension, unspecified: Secondary | ICD-10-CM | POA: Insufficient documentation

## 2020-03-10 LAB — BASIC METABOLIC PANEL
Anion gap: 13 (ref 5–15)
BUN: 18 mg/dL (ref 6–20)
CO2: 22 mmol/L (ref 22–32)
Calcium: 9.1 mg/dL (ref 8.9–10.3)
Chloride: 98 mmol/L (ref 98–111)
Creatinine, Ser: 1.27 mg/dL — ABNORMAL HIGH (ref 0.44–1.00)
GFR, Estimated: 52 mL/min — ABNORMAL LOW (ref >60)
Glucose, Bld: 296 mg/dL — ABNORMAL HIGH (ref 70–99)
Potassium: 4.2 mmol/L (ref 3.5–5.1)
Sodium: 133 mmol/L — ABNORMAL LOW (ref 135–145)

## 2020-03-10 LAB — CBC WITH DIFFERENTIAL/PLATELET
Abs Immature Granulocytes: 0.01 10*3/uL (ref 0.00–0.07)
Basophils Absolute: 0 10*3/uL (ref 0.0–0.1)
Basophils Relative: 1 %
Eosinophils Absolute: 0.2 10*3/uL (ref 0.0–0.5)
Eosinophils Relative: 4 %
HCT: 39.7 % (ref 36.0–46.0)
Hemoglobin: 12.1 g/dL (ref 12.0–15.0)
Immature Granulocytes: 0 %
Lymphocytes Relative: 27 %
Lymphs Abs: 1.3 10*3/uL (ref 0.7–4.0)
MCH: 26.7 pg (ref 26.0–34.0)
MCHC: 30.5 g/dL (ref 30.0–36.0)
MCV: 87.4 fL (ref 80.0–100.0)
Monocytes Absolute: 0.4 10*3/uL (ref 0.1–1.0)
Monocytes Relative: 8 %
Neutro Abs: 2.9 10*3/uL (ref 1.7–7.7)
Neutrophils Relative %: 60 %
Platelets: 148 10*3/uL — ABNORMAL LOW (ref 150–400)
RBC: 4.54 MIL/uL (ref 3.87–5.11)
RDW: 11.9 % (ref 11.5–15.5)
WBC: 4.8 10*3/uL (ref 4.0–10.5)
nRBC: 0 % (ref 0.0–0.2)

## 2020-03-10 LAB — POCT URINALYSIS DIPSTICK
Glucose, UA: POSITIVE — AB
Nitrite, UA: NEGATIVE
Protein, UA: POSITIVE — AB
Spec Grav, UA: >=1.03 — AB (ref 1.010–1.025)
Urobilinogen, UA: 0.2 E.U./dL
pH, UA: 5.5 (ref 5.0–8.0)

## 2020-03-10 LAB — GLUCOSE, CAPILLARY: Glucose-Capillary: 262 mg/dL — ABNORMAL HIGH (ref 70–99)

## 2020-03-10 MED ORDER — ONDANSETRON HCL 4 MG PO TABS: 4.0000 mg | ORAL_TABLET | Freq: Three times a day (TID) | ORAL | 0 refills | Status: DC | PRN

## 2020-03-10 MED ORDER — SODIUM CHLORIDE 0.9 % IV BOLUS: 1000.0000 mL | Freq: Once | INTRAVENOUS | Status: DC

## 2020-03-10 NOTE — Assessment & Plan Note (Addendum)
[name] presenting today with complaints of dizziness and nausea for the past 2 days that started after her fall (she was seen in the ED for this fall on 03/08/2020, CT head and CT abdomen/pelvis were negative for any acute traumatic findings).  CBC and CMP at the time were negative.  She denies any additional falls, chest pain, difficulty breathing, cough, vomiting, dysuria, hematuria, frequency/urgency.  She does endorse feeling cold and sweaty though.  Assessment/plan: BP Readings from Last 3 Encounters:  03/10/20 (!) 81/53  03/08/20 132/86  02/08/20 132/66   [name] presented today with hypotension, even on repeat, with a MAP around 62.  She is symptomatic with dizziness and nausea.  As for the etiology, differential includes poor p.o. intake due to pain secondary from her fall versus infection (however no source identified, no urine or skin changes).  She is on lisinopril 10 mg at home for her blood pressure and she does endorse taking this, however medicine compliance has been questionable in the past.  -We will start with a stat CBC, BMP, UA in addition to IV fluid bolus to determine next steps  Addendum: Patient was able to tolerate oral fluid intake prior to IV insertion and on repeat BP, increased to 154/88.  Standing blood pressure did drop systolic to the 676, however patient was not symptomatic at all.  IV fluids discontinued at this time.  CBC does not show any evidence of leukocytosis or acute anemia to suggest infection or inflammation, or acute bleed.  BMP shows an AKI with a creatinine of 1.27 while her baseline is usually around 0.9.  UA obtained with trace leuks, however patient is asymptomatic.  Urine is quite concentrated as well.    Given that patient is tolerating p.o. intake at this time, it is safe for her to go home and work on maximizing oral intake for the next few days.  Will provide with Zofran to help assist with nausea and dizziness.  [name] and her  daughter at bedside are in agreement with this plan.  -Instructions given to drink at least 6-7 bottles of water per day -Zofran 4 mg every 8 hours as needed -1 week follow-up

## 2020-03-10 NOTE — Progress Notes (Signed)
CC: Fall, dizziness  HPI:  Jennifer [name] is a 49 y.o. with a PMHx as listed below who presents to the clinic for Fall, dizziness.   Please see the Encounters tab for problem-based Assessment & Plan regarding status of patient's acute and chronic conditions.  Past Medical History:  Diagnosis Date  . Anemia, iron deficiency   . Atrial fibrillation ([place])   . Blindness of left eye   . Decreased visual acuity    Left eye  . 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 Hair loss   . History of fracture of clavicle 05/18/2015  . Hyperlipidemia   . Iron deficiency anemia 05/13/2013  . Pain, dental 08/19/2018   Tooth pain/facial swelling: has poor dentition at baseline, history of dental abscess.  She has not seen a dentist in about one year.  Dentist is on [address].  No fevers chills or systemic symptoms.  Diabetes has been poorly controlled for some time.  She said it has improved recently averaging around 140.  Called the dentist said they would not see patients until May 14th but the urgency o  . Pap smear abnormality of cervix with LGSIL   . Routine/ritual circumcision   . Type II diabetes mellitus ([place])   . Uveitis    Review of Systems: Review of Systems  Constitutional: Positive for chills, diaphoresis and malaise/fatigue. Negative for fever and weight loss.  Respiratory: Negative for cough, shortness of breath and wheezing.   Cardiovascular: Negative for chest pain, palpitations and leg swelling.  Gastrointestinal: Positive for nausea. Negative for abdominal pain, constipation, diarrhea and vomiting.  Genitourinary: Negative for dysuria, frequency and urgency.  Musculoskeletal: Positive for falls and joint pain.  Skin: Negative for itching and rash.  Neurological: Positive for dizziness and weakness. Negative for focal weakness and headaches.    Physical Exam:  Vitals:   03/10/20 1322 03/10/20 1334  BP: (!) 92/53 (!) 81/53  Pulse: 78 76  Temp: 97.6 F (36.4 C)   TempSrc: Oral   SpO2: 100%   Height: 5\' 6"  (1.676 m)    Physical Exam Vitals and nursing note reviewed.  Constitutional:      General: She is not in acute distress.    Appearance: She is obese. She is ill-appearing.  HENT:     Head: Normocephalic and atraumatic.  Cardiovascular:     Rate and Rhythm: Normal rate and regular rhythm.     Pulses:          Radial pulses are 2+ on the right side and 2+ on the left side.     Heart sounds: No murmur heard.  No gallop.   Pulmonary:     Effort: Pulmonary effort is normal. No respiratory distress.     Breath sounds: No wheezing, rhonchi or rales.  Abdominal:     General: Bowel sounds are normal.     Palpations: Abdomen is soft.  Musculoskeletal:     Right lower leg: No edema.     Left lower leg: No edema.       Feet:  Feet:     Right foot:     Skin integrity: No ulcer, blister or skin breakdown.     Left foot:     Skin integrity: Ulcer present. No blister or skin breakdown.  Skin:    General: Skin is cool and dry.  Neurological:  General: No focal deficit present.     Mental Status: She is alert and oriented to person, place, and time.     Comments: Generally weak but no focal weakness  Psychiatric:        Mood and Affect: Mood normal.        Behavior: Behavior normal.    Assessment & Plan:   See Encounters Tab for problem based charting.  Patient discussed with Dr. Heber [place]

## 2020-03-10 NOTE — Patient Instructions (Addendum)
It was nice seeing you today! Thank you for choosing [facility] for your Primary Care.    Today we talked about:   Dizziness and low blood pressure: Your pressure when you came to the clinic was very low. We did some blood work that showed you are dehydrated. It will be very important to make sure you drink at at minimum 6-7 bottles of water per day. I also sent in a medication called Ondansetron (Zofran) to the pharmacy to help with the nausea. Take 1 tablet every 8 hours only as needed.   Please follow up in 1-2 weeks.  -------------     !   [facility]  .     :     :        .          .            6-7    .      Ondansetron (Zofran)      .     8    .        .  kan min allatif ruyatuk alyawma! shkran liakhtiarik [facility] lirieayatik al'awaliati.  tahadathna alyawm ean: alduwkhat wainkhifad daght aldum: kan daghtuk eindama 'atayt 'iilaa aleiadat mnkhfdan jdan. qumna bibaed tahalil aldam alati 'azharat 'anak tueani min aljafafi. sayakun min almuhimi jdan alta'akud min shurb ma la yaqilu ean 6-7 zujajat min alma' ywmyan. laqad 'ursilat aydan dwa'an yusamaa Ondansetron (Zofran) 'iilaa alsaydaliat lilmusaeadat fi eilaj alghathayani. khudh qrsan wahdan kulun 8 saeat hasb alhajat faqat.  yurjaa almutabaeat fi ghudun 'usbue 'iilaa 'usbueayni.

## 2020-03-13 ENCOUNTER — Telehealth: Payer: Self-pay | Admitting: *Deleted

## 2020-03-13 ENCOUNTER — Inpatient Hospital Stay (HOSPITAL_COMMUNITY)
Admission: EM | Admit: 2020-03-13 | Discharge: 2020-03-18 | DRG: 638 | Disposition: A | Discharge status: Home Health | Payer: Medicaid Other | Attending: Internal Medicine | Admitting: Internal Medicine

## 2020-03-13 ENCOUNTER — Other Ambulatory Visit: Payer: Self-pay

## 2020-03-13 DIAGNOSIS — Z833 Family history of diabetes mellitus: Secondary | ICD-10-CM

## 2020-03-13 DIAGNOSIS — E1159 Type 2 diabetes mellitus with other circulatory complications: Secondary | ICD-10-CM | POA: Diagnosis present

## 2020-03-13 DIAGNOSIS — L03116 Cellulitis of left lower limb: Secondary | ICD-10-CM | POA: Diagnosis present

## 2020-03-13 DIAGNOSIS — G43909 Migraine, unspecified, not intractable, without status migrainosus: Secondary | ICD-10-CM | POA: Diagnosis present

## 2020-03-13 DIAGNOSIS — E119 Type 2 diabetes mellitus without complications: Secondary | ICD-10-CM

## 2020-03-13 DIAGNOSIS — I4891 Unspecified atrial fibrillation: Secondary | ICD-10-CM | POA: Diagnosis present

## 2020-03-13 DIAGNOSIS — I5032 Chronic diastolic (congestive) heart failure: Secondary | ICD-10-CM | POA: Diagnosis present

## 2020-03-13 DIAGNOSIS — E860 Dehydration: Secondary | ICD-10-CM | POA: Diagnosis present

## 2020-03-13 DIAGNOSIS — I959 Hypotension, unspecified: Secondary | ICD-10-CM | POA: Diagnosis present

## 2020-03-13 DIAGNOSIS — Z7901 Long term (current) use of anticoagulants: Secondary | ICD-10-CM

## 2020-03-13 DIAGNOSIS — Z79899 Other long term (current) drug therapy: Secondary | ICD-10-CM

## 2020-03-13 DIAGNOSIS — N179 Acute kidney failure, unspecified: Secondary | ICD-10-CM | POA: Diagnosis present

## 2020-03-13 DIAGNOSIS — N390 Urinary tract infection, site not specified: Secondary | ICD-10-CM | POA: Diagnosis present

## 2020-03-13 DIAGNOSIS — E1143 Type 2 diabetes mellitus with diabetic autonomic (poly)neuropathy: Secondary | ICD-10-CM | POA: Diagnosis present

## 2020-03-13 DIAGNOSIS — E1169 Type 2 diabetes mellitus with other specified complication: Secondary | ICD-10-CM | POA: Diagnosis present

## 2020-03-13 DIAGNOSIS — M869 Osteomyelitis, unspecified: Secondary | ICD-10-CM

## 2020-03-13 DIAGNOSIS — E1110 Type 2 diabetes mellitus with ketoacidosis without coma: Principal | ICD-10-CM | POA: Diagnosis present

## 2020-03-13 DIAGNOSIS — E11649 Type 2 diabetes mellitus with hypoglycemia without coma: Secondary | ICD-10-CM | POA: Diagnosis present

## 2020-03-13 DIAGNOSIS — E785 Hyperlipidemia, unspecified: Secondary | ICD-10-CM | POA: Diagnosis present

## 2020-03-13 DIAGNOSIS — E611 Iron deficiency: Secondary | ICD-10-CM | POA: Diagnosis present

## 2020-03-13 DIAGNOSIS — H409 Unspecified glaucoma: Secondary | ICD-10-CM | POA: Diagnosis present

## 2020-03-13 DIAGNOSIS — E11621 Type 2 diabetes mellitus with foot ulcer: Secondary | ICD-10-CM | POA: Diagnosis present

## 2020-03-13 DIAGNOSIS — Z20822 Contact with and (suspected) exposure to covid-19: Secondary | ICD-10-CM | POA: Diagnosis present

## 2020-03-13 DIAGNOSIS — G901 Familial dysautonomia [Riley-Day]: Secondary | ICD-10-CM | POA: Diagnosis present

## 2020-03-13 DIAGNOSIS — I480 Paroxysmal atrial fibrillation: Secondary | ICD-10-CM | POA: Diagnosis present

## 2020-03-13 DIAGNOSIS — Z794 Long term (current) use of insulin: Secondary | ICD-10-CM

## 2020-03-13 DIAGNOSIS — I152 Hypertension secondary to endocrine disorders: Secondary | ICD-10-CM | POA: Diagnosis present

## 2020-03-13 DIAGNOSIS — Z8249 Family history of ischemic heart disease and other diseases of the circulatory system: Secondary | ICD-10-CM

## 2020-03-13 DIAGNOSIS — H4042X3 Glaucoma secondary to eye inflammation, left eye, severe stage: Secondary | ICD-10-CM

## 2020-03-13 DIAGNOSIS — R739 Hyperglycemia, unspecified: Secondary | ICD-10-CM

## 2020-03-13 DIAGNOSIS — K3184 Gastroparesis: Secondary | ICD-10-CM | POA: Diagnosis present

## 2020-03-13 DIAGNOSIS — Z961 Presence of intraocular lens: Secondary | ICD-10-CM

## 2020-03-13 DIAGNOSIS — H5462 Unqualified visual loss, left eye, normal vision right eye: Secondary | ICD-10-CM | POA: Diagnosis present

## 2020-03-13 DIAGNOSIS — R509 Fever, unspecified: Secondary | ICD-10-CM

## 2020-03-13 DIAGNOSIS — R112 Nausea with vomiting, unspecified: Secondary | ICD-10-CM

## 2020-03-13 DIAGNOSIS — L97529 Non-pressure chronic ulcer of other part of left foot with unspecified severity: Secondary | ICD-10-CM | POA: Diagnosis present

## 2020-03-13 DIAGNOSIS — R8290 Unspecified abnormal findings in urine: Secondary | ICD-10-CM

## 2020-03-13 DIAGNOSIS — I5030 Unspecified diastolic (congestive) heart failure: Secondary | ICD-10-CM | POA: Diagnosis present

## 2020-03-13 DIAGNOSIS — R319 Hematuria, unspecified: Secondary | ICD-10-CM | POA: Diagnosis present

## 2020-03-13 LAB — COMPREHENSIVE METABOLIC PANEL
ALT: 11 U/L (ref 0–44)
AST: 11 U/L — ABNORMAL LOW (ref 15–41)
Albumin: 2.7 g/dL — ABNORMAL LOW (ref 3.5–5.0)
Alkaline Phosphatase: 96 U/L (ref 38–126)
Anion gap: 21 — ABNORMAL HIGH (ref 5–15)
BUN: 26 mg/dL — ABNORMAL HIGH (ref 6–20)
CO2: 14 mmol/L — ABNORMAL LOW (ref 22–32)
Calcium: 8.8 mg/dL — ABNORMAL LOW (ref 8.9–10.3)
Chloride: 94 mmol/L — ABNORMAL LOW (ref 98–111)
Creatinine, Ser: 1.94 mg/dL — ABNORMAL HIGH (ref 0.44–1.00)
GFR, Estimated: 31 mL/min — ABNORMAL LOW (ref >60)
Glucose, Bld: 412 mg/dL — ABNORMAL HIGH (ref 70–99)
Potassium: 4.5 mmol/L (ref 3.5–5.1)
Sodium: 129 mmol/L — ABNORMAL LOW (ref 135–145)
Total Bilirubin: 1.8 mg/dL — ABNORMAL HIGH (ref 0.3–1.2)
Total Protein: 7.5 g/dL (ref 6.5–8.1)

## 2020-03-13 LAB — URINALYSIS, ROUTINE W REFLEX MICROSCOPIC
Bilirubin Urine: NEGATIVE
Glucose, UA: >=500 mg/dL — AB
Ketones, ur: 20 mg/dL — AB
Nitrite: NEGATIVE
Protein, ur: 100 mg/dL — AB
Specific Gravity, Urine: 1.015 (ref 1.005–1.030)
WBC, UA: >50 WBC/hpf — ABNORMAL HIGH (ref 0–5)
pH: 5 (ref 5.0–8.0)

## 2020-03-13 LAB — LIPASE, BLOOD: Lipase: 19 U/L (ref 11–51)

## 2020-03-13 LAB — CBC
HCT: 36.6 % (ref 36.0–46.0)
Hemoglobin: 10.7 g/dL — ABNORMAL LOW (ref 12.0–15.0)
MCH: 26.6 pg (ref 26.0–34.0)
MCHC: 29.2 g/dL — ABNORMAL LOW (ref 30.0–36.0)
MCV: 91 fL (ref 80.0–100.0)
Platelets: 218 10*3/uL (ref 150–400)
RBC: 4.02 MIL/uL (ref 3.87–5.11)
RDW: 11.9 % (ref 11.5–15.5)
WBC: 9.2 10*3/uL (ref 4.0–10.5)
nRBC: 0 % (ref 0.0–0.2)

## 2020-03-13 LAB — CBG MONITORING, ED: Glucose-Capillary: 423 mg/dL — ABNORMAL HIGH (ref 70–99)

## 2020-03-13 LAB — I-STAT BETA HCG BLOOD, ED (MC, WL, AP ONLY): I-stat hCG, quantitative: 7.4 m[IU]/mL — ABNORMAL HIGH (ref <5)

## 2020-03-13 MED ORDER — ONDANSETRON 4 MG PO TBDP
4.0000 mg | ORAL_TABLET | Freq: Once | ORAL | Status: AC | PRN
  Administered 2020-03-13: 4 mg via ORAL
  Filled 2020-03-13: qty 1

## 2020-03-13 NOTE — ED Triage Notes (Signed)
C/o vomiting for few days; reported recently seen for same issue. From prior triage note on 11/19 seen for fall.

## 2020-03-13 NOTE — Telephone Encounter (Signed)
Pt presented at front desk, son speaking for her, stating she has been "throwing up for 2 days" she is referred to ED, he is hesitant but is informed that the best place for her at present is ED due to diabetes and not keeping anything down, states she threw up last 30 mins ago. infomed late nurse stacee, she accompanied to ED and dr Evette Doffing agreed.

## 2020-03-14 ENCOUNTER — Observation Stay (HOSPITAL_COMMUNITY): Payer: Medicaid Other

## 2020-03-14 ENCOUNTER — Encounter (HOSPITAL_COMMUNITY): Payer: Self-pay | Admitting: Internal Medicine

## 2020-03-14 DIAGNOSIS — Z20822 Contact with and (suspected) exposure to covid-19: Secondary | ICD-10-CM | POA: Diagnosis present

## 2020-03-14 DIAGNOSIS — E11649 Type 2 diabetes mellitus with hypoglycemia without coma: Secondary | ICD-10-CM | POA: Diagnosis present

## 2020-03-14 DIAGNOSIS — E1110 Type 2 diabetes mellitus with ketoacidosis without coma: Principal | ICD-10-CM

## 2020-03-14 DIAGNOSIS — L03116 Cellulitis of left lower limb: Secondary | ICD-10-CM | POA: Diagnosis present

## 2020-03-14 DIAGNOSIS — E1143 Type 2 diabetes mellitus with diabetic autonomic (poly)neuropathy: Secondary | ICD-10-CM | POA: Diagnosis present

## 2020-03-14 DIAGNOSIS — E785 Hyperlipidemia, unspecified: Secondary | ICD-10-CM | POA: Diagnosis present

## 2020-03-14 DIAGNOSIS — G901 Familial dysautonomia [Riley-Day]: Secondary | ICD-10-CM | POA: Diagnosis present

## 2020-03-14 DIAGNOSIS — I152 Hypertension secondary to endocrine disorders: Secondary | ICD-10-CM | POA: Diagnosis present

## 2020-03-14 DIAGNOSIS — I5032 Chronic diastolic (congestive) heart failure: Secondary | ICD-10-CM | POA: Diagnosis present

## 2020-03-14 DIAGNOSIS — N179 Acute kidney failure, unspecified: Secondary | ICD-10-CM | POA: Diagnosis present

## 2020-03-14 DIAGNOSIS — Z8249 Family history of ischemic heart disease and other diseases of the circulatory system: Secondary | ICD-10-CM | POA: Diagnosis not present

## 2020-03-14 DIAGNOSIS — I959 Hypotension, unspecified: Secondary | ICD-10-CM | POA: Diagnosis present

## 2020-03-14 DIAGNOSIS — E1169 Type 2 diabetes mellitus with other specified complication: Secondary | ICD-10-CM | POA: Diagnosis present

## 2020-03-14 DIAGNOSIS — I4891 Unspecified atrial fibrillation: Secondary | ICD-10-CM

## 2020-03-14 DIAGNOSIS — E860 Dehydration: Secondary | ICD-10-CM | POA: Diagnosis present

## 2020-03-14 DIAGNOSIS — N390 Urinary tract infection, site not specified: Secondary | ICD-10-CM | POA: Diagnosis present

## 2020-03-14 DIAGNOSIS — R112 Nausea with vomiting, unspecified: Secondary | ICD-10-CM | POA: Diagnosis present

## 2020-03-14 DIAGNOSIS — I480 Paroxysmal atrial fibrillation: Secondary | ICD-10-CM | POA: Diagnosis present

## 2020-03-14 DIAGNOSIS — I361 Nonrheumatic tricuspid (valve) insufficiency: Secondary | ICD-10-CM | POA: Diagnosis not present

## 2020-03-14 DIAGNOSIS — G43909 Migraine, unspecified, not intractable, without status migrainosus: Secondary | ICD-10-CM | POA: Diagnosis present

## 2020-03-14 DIAGNOSIS — D500 Iron deficiency anemia secondary to blood loss (chronic): Secondary | ICD-10-CM | POA: Diagnosis not present

## 2020-03-14 DIAGNOSIS — L97529 Non-pressure chronic ulcer of other part of left foot with unspecified severity: Secondary | ICD-10-CM | POA: Diagnosis present

## 2020-03-14 DIAGNOSIS — K3184 Gastroparesis: Secondary | ICD-10-CM | POA: Diagnosis present

## 2020-03-14 DIAGNOSIS — Z833 Family history of diabetes mellitus: Secondary | ICD-10-CM | POA: Diagnosis not present

## 2020-03-14 DIAGNOSIS — E611 Iron deficiency: Secondary | ICD-10-CM | POA: Diagnosis present

## 2020-03-14 DIAGNOSIS — R509 Fever, unspecified: Secondary | ICD-10-CM | POA: Diagnosis not present

## 2020-03-14 DIAGNOSIS — I10 Essential (primary) hypertension: Secondary | ICD-10-CM

## 2020-03-14 DIAGNOSIS — H409 Unspecified glaucoma: Secondary | ICD-10-CM | POA: Diagnosis present

## 2020-03-14 DIAGNOSIS — E11621 Type 2 diabetes mellitus with foot ulcer: Secondary | ICD-10-CM | POA: Diagnosis present

## 2020-03-14 DIAGNOSIS — I340 Nonrheumatic mitral (valve) insufficiency: Secondary | ICD-10-CM | POA: Diagnosis not present

## 2020-03-14 DIAGNOSIS — H5462 Unqualified visual loss, left eye, normal vision right eye: Secondary | ICD-10-CM | POA: Diagnosis present

## 2020-03-14 LAB — BASIC METABOLIC PANEL
Anion gap: 21 — ABNORMAL HIGH (ref 5–15)
Anion gap: 22 — ABNORMAL HIGH (ref 5–15)
Anion gap: 13 (ref 5–15)
Anion gap: 12 (ref 5–15)
Anion gap: 12 (ref 5–15)
BUN: 31 mg/dL — ABNORMAL HIGH (ref 6–20)
BUN: 31 mg/dL — ABNORMAL HIGH (ref 6–20)
BUN: 27 mg/dL — ABNORMAL HIGH (ref 6–20)
BUN: 25 mg/dL — ABNORMAL HIGH (ref 6–20)
BUN: 24 mg/dL — ABNORMAL HIGH (ref 6–20)
CO2: 15 mmol/L — ABNORMAL LOW (ref 22–32)
CO2: 18 mmol/L — ABNORMAL LOW (ref 22–32)
CO2: 24 mmol/L (ref 22–32)
CO2: 25 mmol/L (ref 22–32)
CO2: 25 mmol/L (ref 22–32)
Calcium: 8.7 mg/dL — ABNORMAL LOW (ref 8.9–10.3)
Calcium: 9 mg/dL (ref 8.9–10.3)
Calcium: 8.8 mg/dL — ABNORMAL LOW (ref 8.9–10.3)
Calcium: 8.6 mg/dL — ABNORMAL LOW (ref 8.9–10.3)
Calcium: 8.8 mg/dL — ABNORMAL LOW (ref 8.9–10.3)
Chloride: 92 mmol/L — ABNORMAL LOW (ref 98–111)
Chloride: 91 mmol/L — ABNORMAL LOW (ref 98–111)
Chloride: 96 mmol/L — ABNORMAL LOW (ref 98–111)
Chloride: 97 mmol/L — ABNORMAL LOW (ref 98–111)
Chloride: 97 mmol/L — ABNORMAL LOW (ref 98–111)
Creatinine, Ser: 2.15 mg/dL — ABNORMAL HIGH (ref 0.44–1.00)
Creatinine, Ser: 1.93 mg/dL — ABNORMAL HIGH (ref 0.44–1.00)
Creatinine, Ser: 1.6 mg/dL — ABNORMAL HIGH (ref 0.44–1.00)
Creatinine, Ser: 1.28 mg/dL — ABNORMAL HIGH (ref 0.44–1.00)
Creatinine, Ser: 1.19 mg/dL — ABNORMAL HIGH (ref 0.44–1.00)
GFR, Estimated: 28 mL/min — ABNORMAL LOW (ref >60)
GFR, Estimated: 32 mL/min — ABNORMAL LOW (ref >60)
GFR, Estimated: 40 mL/min — ABNORMAL LOW (ref >60)
GFR, Estimated: 52 mL/min — ABNORMAL LOW (ref >60)
GFR, Estimated: 56 mL/min — ABNORMAL LOW (ref >60)
Glucose, Bld: 444 mg/dL — ABNORMAL HIGH (ref 70–99)
Glucose, Bld: 262 mg/dL — ABNORMAL HIGH (ref 70–99)
Glucose, Bld: 202 mg/dL — ABNORMAL HIGH (ref 70–99)
Glucose, Bld: 218 mg/dL — ABNORMAL HIGH (ref 70–99)
Glucose, Bld: 179 mg/dL — ABNORMAL HIGH (ref 70–99)
Potassium: 4.4 mmol/L (ref 3.5–5.1)
Potassium: 3.8 mmol/L (ref 3.5–5.1)
Potassium: 3.7 mmol/L (ref 3.5–5.1)
Potassium: 3.7 mmol/L (ref 3.5–5.1)
Potassium: 3.6 mmol/L (ref 3.5–5.1)
Sodium: 128 mmol/L — ABNORMAL LOW (ref 135–145)
Sodium: 131 mmol/L — ABNORMAL LOW (ref 135–145)
Sodium: 133 mmol/L — ABNORMAL LOW (ref 135–145)
Sodium: 134 mmol/L — ABNORMAL LOW (ref 135–145)
Sodium: 134 mmol/L — ABNORMAL LOW (ref 135–145)

## 2020-03-14 LAB — HEPATIC FUNCTION PANEL
ALT: 12 U/L (ref 0–44)
AST: 20 U/L (ref 15–41)
Albumin: 2.1 g/dL — ABNORMAL LOW (ref 3.5–5.0)
Alkaline Phosphatase: 107 U/L (ref 38–126)
Bilirubin, Direct: <0.1 mg/dL (ref 0.0–0.2)
Total Bilirubin: 0.4 mg/dL (ref 0.3–1.2)
Total Protein: 6.2 g/dL — ABNORMAL LOW (ref 6.5–8.1)

## 2020-03-14 LAB — CBG MONITORING, ED
Glucose-Capillary: 403 mg/dL — ABNORMAL HIGH (ref 70–99)
Glucose-Capillary: 355 mg/dL — ABNORMAL HIGH (ref 70–99)
Glucose-Capillary: 409 mg/dL — ABNORMAL HIGH (ref 70–99)
Glucose-Capillary: 228 mg/dL — ABNORMAL HIGH (ref 70–99)
Glucose-Capillary: 165 mg/dL — ABNORMAL HIGH (ref 70–99)
Glucose-Capillary: 166 mg/dL — ABNORMAL HIGH (ref 70–99)
Glucose-Capillary: 171 mg/dL — ABNORMAL HIGH (ref 70–99)
Glucose-Capillary: 179 mg/dL — ABNORMAL HIGH (ref 70–99)
Glucose-Capillary: 168 mg/dL — ABNORMAL HIGH (ref 70–99)
Glucose-Capillary: 187 mg/dL — ABNORMAL HIGH (ref 70–99)
Glucose-Capillary: 170 mg/dL — ABNORMAL HIGH (ref 70–99)
Glucose-Capillary: 152 mg/dL — ABNORMAL HIGH (ref 70–99)
Glucose-Capillary: 167 mg/dL — ABNORMAL HIGH (ref 70–99)

## 2020-03-14 LAB — I-STAT VENOUS BLOOD GAS, ED
Acid-base deficit: 9 mmol/L — ABNORMAL HIGH (ref 0.0–2.0)
Bicarbonate: 17.4 mmol/L — ABNORMAL LOW (ref 20.0–28.0)
Calcium, Ion: 1.11 mmol/L — ABNORMAL LOW (ref 1.15–1.40)
HCT: 52 % — ABNORMAL HIGH (ref 36.0–46.0)
Hemoglobin: 17.7 g/dL — ABNORMAL HIGH (ref 12.0–15.0)
O2 Saturation: 99 %
Potassium: 4.4 mmol/L (ref 3.5–5.1)
Sodium: 128 mmol/L — ABNORMAL LOW (ref 135–145)
TCO2: 19 mmol/L — ABNORMAL LOW (ref 22–32)
pCO2, Ven: 40.4 mmHg — ABNORMAL LOW (ref 44.0–60.0)
pH, Ven: 7.243 — ABNORMAL LOW (ref 7.250–7.430)
pO2, Ven: 144 mmHg — ABNORMAL HIGH (ref 32.0–45.0)

## 2020-03-14 LAB — TSH: TSH: 1.272 u[IU]/mL (ref 0.350–4.500)

## 2020-03-14 LAB — GLUCOSE, CAPILLARY
Glucose-Capillary: 185 mg/dL — ABNORMAL HIGH (ref 70–99)
Glucose-Capillary: 195 mg/dL — ABNORMAL HIGH (ref 70–99)
Glucose-Capillary: 281 mg/dL — ABNORMAL HIGH (ref 70–99)

## 2020-03-14 LAB — CBC
HCT: 34.9 % — ABNORMAL LOW (ref 36.0–46.0)
Hemoglobin: 11 g/dL — ABNORMAL LOW (ref 12.0–15.0)
MCH: 27 pg (ref 26.0–34.0)
MCHC: 31.5 g/dL (ref 30.0–36.0)
MCV: 85.5 fL (ref 80.0–100.0)
Platelets: 213 10*3/uL (ref 150–400)
RBC: 4.08 MIL/uL (ref 3.87–5.11)
RDW: 12 % (ref 11.5–15.5)
WBC: 8.9 10*3/uL (ref 4.0–10.5)
nRBC: 0 % (ref 0.0–0.2)

## 2020-03-14 LAB — BETA-HYDROXYBUTYRIC ACID
Beta-Hydroxybutyric Acid: 7.78 mmol/L — ABNORMAL HIGH (ref 0.05–0.27)
Beta-Hydroxybutyric Acid: 2.21 mmol/L — ABNORMAL HIGH (ref 0.05–0.27)
Beta-Hydroxybutyric Acid: 0.7 mmol/L — ABNORMAL HIGH (ref 0.05–0.27)

## 2020-03-14 LAB — HCG, QUANTITATIVE, PREGNANCY: hCG, Beta Chain, Quant, S: 5 m[IU]/mL — ABNORMAL HIGH (ref <5)

## 2020-03-14 LAB — HIV ANTIBODY (ROUTINE TESTING W REFLEX): HIV Screen 4th Generation wRfx: NONREACTIVE

## 2020-03-14 LAB — RESPIRATORY PANEL BY RT PCR (FLU A&B, COVID)
Influenza A by PCR: NEGATIVE
Influenza B by PCR: NEGATIVE
SARS Coronavirus 2 by RT PCR: NEGATIVE

## 2020-03-14 MED ORDER — ACETAMINOPHEN 325 MG PO TABS
650.0000 mg | ORAL_TABLET | Freq: Four times a day (QID) | ORAL | Status: DC | PRN
  Administered 2020-03-14 – 2020-03-15 (×4): 650 mg via ORAL
  Filled 2020-03-14 (×4): qty 2

## 2020-03-14 MED ORDER — LACTATED RINGERS IV SOLN: INTRAVENOUS | Status: DC

## 2020-03-14 MED ORDER — ACETAMINOPHEN 650 MG RE SUPP: 650.0000 mg | Freq: Four times a day (QID) | RECTAL | Status: DC | PRN

## 2020-03-14 MED ORDER — LACTATED RINGERS IV BOLUS
1800.0000 mL | Freq: Once | INTRAVENOUS | Status: AC
  Administered 2020-03-14: 1800 mL via INTRAVENOUS

## 2020-03-14 MED ORDER — LACTATED RINGERS IV BOLUS: 1800.0000 mL | Freq: Once | INTRAVENOUS | Status: DC

## 2020-03-14 MED ORDER — POLYETHYLENE GLYCOL 3350 17 G PO PACK
17.0000 g | PACK | Freq: Every day | ORAL | Status: DC | PRN
  Administered 2020-03-15: 17 g via ORAL
  Filled 2020-03-14: qty 1

## 2020-03-14 MED ORDER — ENOXAPARIN SODIUM 40 MG/0.4ML ~~LOC~~ SOLN
40.0000 mg | SUBCUTANEOUS | Status: DC
  Administered 2020-03-15 – 2020-03-16 (×2): 40 mg via SUBCUTANEOUS
  Filled 2020-03-14 (×2): qty 0.4

## 2020-03-14 MED ORDER — ONDANSETRON HCL 4 MG/2ML IJ SOLN
4.0000 mg | Freq: Four times a day (QID) | INTRAMUSCULAR | Status: DC | PRN
  Administered 2020-03-14 – 2020-03-15 (×2): 4 mg via INTRAVENOUS
  Filled 2020-03-14 (×2): qty 2

## 2020-03-14 MED ORDER — SODIUM CHLORIDE 0.9 % IV SOLN: INTRAVENOUS | Status: DC

## 2020-03-14 MED ORDER — POTASSIUM CHLORIDE 10 MEQ/100ML IV SOLN
10.0000 meq | INTRAVENOUS | Status: AC
  Administered 2020-03-14: 10 meq via INTRAVENOUS
  Filled 2020-03-14: qty 100

## 2020-03-14 MED ORDER — SODIUM CHLORIDE 0.9 % IV SOLN
1.0000 g | Freq: Once | INTRAVENOUS | Status: AC
  Administered 2020-03-14: 1 g via INTRAVENOUS
  Filled 2020-03-14: qty 10

## 2020-03-14 MED ORDER — DEXTROSE IN LACTATED RINGERS 5 % IV SOLN: INTRAVENOUS | Status: DC

## 2020-03-14 MED ORDER — INSULIN REGULAR(HUMAN) IN NACL 100-0.9 UT/100ML-% IV SOLN
INTRAVENOUS | Status: DC
  Administered 2020-03-14: 17 [IU]/h via INTRAVENOUS
  Filled 2020-03-14: qty 100

## 2020-03-14 MED ORDER — ENOXAPARIN SODIUM 30 MG/0.3ML ~~LOC~~ SOLN: 30.0000 mg | SUBCUTANEOUS | Status: DC

## 2020-03-14 MED ORDER — DORZOLAMIDE HCL-TIMOLOL MAL 2-0.5 % OP SOLN
1.0000 [drp] | Freq: Two times a day (BID) | OPHTHALMIC | Status: DC
  Administered 2020-03-14 – 2020-03-18 (×8): 1 [drp] via OPHTHALMIC
  Filled 2020-03-14 (×2): qty 10

## 2020-03-14 MED ORDER — DEXTROSE-NACL 5-0.45 % IV SOLN: INTRAVENOUS | Status: DC

## 2020-03-14 MED ORDER — INSULIN REGULAR(HUMAN) IN NACL 100-0.9 UT/100ML-% IV SOLN
INTRAVENOUS | Status: DC
  Administered 2020-03-14: 13 [IU]/h via INTRAVENOUS
  Filled 2020-03-14: qty 100

## 2020-03-14 MED ORDER — INSULIN ASPART 100 UNIT/ML ~~LOC~~ SOLN
0.0000 [IU] | Freq: Three times a day (TID) | SUBCUTANEOUS | Status: DC
  Administered 2020-03-14: 4 [IU] via SUBCUTANEOUS
  Administered 2020-03-15: 3 [IU] via SUBCUTANEOUS
  Administered 2020-03-15: 20 [IU] via SUBCUTANEOUS
  Administered 2020-03-16: 4 [IU] via SUBCUTANEOUS
  Administered 2020-03-16: 7 [IU] via SUBCUTANEOUS
  Administered 2020-03-17 – 2020-03-18 (×4): 3 [IU] via SUBCUTANEOUS

## 2020-03-14 MED ORDER — DEXTROSE 50 % IV SOLN: 0.0000 mL | INTRAVENOUS | Status: DC | PRN

## 2020-03-14 MED ORDER — INSULIN GLARGINE 100 UNIT/ML ~~LOC~~ SOLN
22.0000 [IU] | SUBCUTANEOUS | Status: DC
  Administered 2020-03-14: 22 [IU] via SUBCUTANEOUS
  Filled 2020-03-14 (×2): qty 0.22

## 2020-03-14 NOTE — Progress Notes (Signed)
Subjective:   Hospital day:1  Patient is seen at bedside with her daughter.  Interpretation from her daughter.  Patient states that her nausea/vomiting has improved but still have 1 episode of emesis 2 hours prior.  Unclear of the color of the emesis.  Still complaining of abdominal pain.  Denies chest pain or shortness of breath.  Her daughter rates the concerned of the lesion on her left second toe.  She states that they saw a podiatrist in the past but no intervention was done.  Also speaking with patient's son on the phone.  He states that patient has had some episodes of hypoglycemia after giving insulin, which she has to drink tea and eat sugar biscuits.  Discussed with him that patient will need regular follow-up with the clinic and work with the diabetic educator to have a safe insulin regimen.  Objective:  Vital signs in last 24 hours: Vitals:   03/13/20 2250 03/14/20 0206 03/14/20 0330 03/14/20 0545  BP: 137/65 (!) 138/53 (!) 135/47 (!) 139/49  Pulse: 91 95 95 (!) 103  Resp: 16 20 (!) 25 17  Temp:      TempSrc:      SpO2: 98% 98% 98% 92%   CBC Latest Ref Rng & Units 03/14/2020 03/14/2020 03/13/2020  WBC 4.0 - 10.5 K/uL 8.9 - 9.2  Hemoglobin 12.0 - 15.0 g/dL 11.0(L) 17.7(H) 10.7(L)  Hematocrit 36 - 46 % 34.9(L) 52.0(H) 36.6  Platelets 150 - 400 K/uL 213 - 218   CMP Latest Ref Rng & Units 03/14/2020 03/14/2020 03/14/2020  Glucose 70 - 99 mg/dL 262(H) - 444(H)  BUN 6 - 20 mg/dL 31(H) - 31(H)  Creatinine 0.44 - 1.00 mg/dL 1.93(H) - 2.15(H)  Sodium 135 - 145 mmol/L 131(L) 128(L) 128(L)  Potassium 3.5 - 5.1 mmol/L 3.8 4.4 4.4  Chloride 98 - 111 mmol/L 91(L) - 92(L)  CO2 22 - 32 mmol/L 18(L) - 15(L)  Calcium 8.9 - 10.3 mg/dL 9.0 - 8.7(L)  Total Protein 6.5 - 8.1 g/dL - - -  Total Bilirubin 0.3 - 1.2 mg/dL - - -  Alkaline Phos 38 - 126 U/L - - -  AST 15 - 41 U/L - - -  ALT 0 - 44 U/L - - -     Physical Exam  Physical Exam Constitutional:      Appearance: She is  obese. She is ill-appearing.  HENT:     Head: Normocephalic.  Eyes:     General:        Right eye: No discharge.        Left eye: No discharge.  Cardiovascular:     Rate and Rhythm: Normal rate and regular rhythm.  Pulmonary:     Effort: No respiratory distress.  Abdominal:     General: Bowel sounds are normal.     Tenderness: There is abdominal tenderness.     Comments: Negative Murphy sign Tenderness to palpation of Left lower quadrant.   Musculoskeletal:     Right lower leg: No edema.     Left lower leg: No edema.  Skin:    Comments: Raised, thickened callus of left second toe     Assessment/Plan: Ms. 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  Active Problems:   DKA (diabetic ketoacidosis) ([place])  Diabetic ketoacidosis History of poorly controlled type II diabetes mellitus Unclear etiology of triggers.  Likely to be due to inconsistency of taking medications, she does have history of noncompliance with A1c>14.  Also contributed by GI illness with nausea and vomiting.  Low suspicion for UTI given lack of symptoms.  UA shows large leukocytes with rare bacteria and suspicious for contamination with 6-10 squamous epithelial cells.  Could be diverticulitis given her LLQ pain but she has not had any fever or elevated white blood count.  Gastroparesis is on the differential list and can be evaluate when she is stable.  -Transitioning off the Endo tool to Lantus 22 units daily (closed anion gap x 2 with trending down beta-hydroxybutaric acid). -Continue D5-1/2NS until finished with insulin drip.  Will transition to LR after - potassium  supplementation as needed -Advance diet slowly -CBG Q1H -BMP twice daily - F/u urine culture   Acute kidney injury-improving Baseline sCr ~0.9. Suspect pre-renal in setting of dehydration from vomiting and poor PO intake vs diabetic nephropathy.   -Trend creatinine 1.9-1.6-1.28 -Continue fluids   Left 2nd toe ulcer Patient states the ulcer has been present for ~3 months without a known trauma to the area. Anterior tibialis pulses 2+ bilaterally. Denies pain.  X-ray negative for osteomyelitis. -Podiatry outpatient follow-up   Elevated beta-hCG Elevated beta hCG on i-STAT.  Will obtain quantitative hCG to confirm   History of falls Dizziness Patient and family report a history of poor balance and falls, ~5 in the last year. Likely multifactorial in the setting of partial blindness, orthostasis, possible diabetic peripheral neuropathy. Differential also includes CVA given patient's comorbid risk factors, though less likely. Dysautonomia from poorly controlled diabetes could also be contributing. - orthostatic vitals - PT/OT   HFpEF Most recent echo in 08/2018 with EF 50-55%. Prescribed Lasix 20 mg PO daily as needed for lower extremity swelling which patient and family report is frequent. Patient has not been taking any of her mediations for the last 5 days. - Hold home Lasix in setting of DKA/dehydration/AKI   Hypertension Prescribed lisinopril 10 mg daily. - Start home lisinopril 10 mg daily once patient tolerates PO    Hyperlipidemia On atorvastatin 40 mg daily. - Start home atorvastatin 40 mg daily once tolerating PO   Atrial fibrillation Prescribed Eliquis 5 mg twice daily.  Unsure if patient is taking - Start home Eliquis 5 mg twice daily once tolerating PO - Currently on Lovenox for DVT ppx   Normocytic anemia Hgb 11.8 with MCV 85.3. Suspect reported red emesis secondary to Gatorade patient has been drinking. Patient has a history of iron deficiency anemia  requiring PO iron supplementation.  - AM CBC w/ diff  Diet: Carb modified IVF: D5 half-normal saline VTE: Lovenox CODE: Full  Prior to Admission Living Arrangement: Home Anticipated Discharge Location: To be determined Barriers to Discharge: Medical management for DKA Dispo: Anticipated discharge in approximately 1-2 day(s).   Gaylan Gerold, DO 03/14/2020, 7:00 AM Pager: [phone] After 5pm on weekdays and 1pm on weekends: On Call pager [phone]

## 2020-03-14 NOTE — Plan of Care (Signed)

## 2020-03-14 NOTE — Plan of Care (Signed)
  Problem: Metabolic: Goal: Ability to maintain appropriate glucose levels will improve Outcome: Progressing   Problem: Nutritional: Goal: Maintenance of adequate nutrition will improve Outcome: Progressing   Problem: Respiratory: Goal: Will regain and/or maintain adequate ventilation Outcome: Progressing

## 2020-03-14 NOTE — ED Provider Notes (Signed)
[facility] EMERGENCY DEPARTMENT Provider Note   CSN: [id number] Arrival date & time: 03/13/20  1613     History Chief Complaint  Patient presents with  . Emesis    Jennifer [name] is a 49 y.o. female.  Patient with past medical history notable for type 2 diabetes, A. fib, presents to the emergency department with a chief complaint of nausea and vomiting.  She also reports associated dysuria.  She states the symptoms have been ongoing for about the past 5 days.  She states she has not been able to take her regular diabetes medications due to her persistent vomiting.  She reports some left lower abdominal pain, which she attributes to the dysuria.  She denies any fevers or chills.  She denies any diarrhea.  She denies any other associated symptoms.  The history is provided by the patient. No language interpreter was used.       Past Medical History:  Diagnosis Date  . Anemia, iron deficiency   . Atrial fibrillation ([place])   . Blindness of left eye   . Decreased visual acuity    Left eye  . 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 Hair loss   . History of fracture of clavicle 05/18/2015  . Hyperlipidemia   . Iron deficiency anemia 05/13/2013  . Pain, dental 08/19/2018   Tooth pain/facial swelling: has poor dentition at baseline, history of dental abscess.  She has not seen a dentist in about one year.  Dentist is on [address].  No fevers chills or systemic symptoms.  Diabetes has been poorly controlled for some time.  She said it has improved recently averaging around 140.  Called the dentist said they would not see patients until May 14th but the urgency o  . Pap smear abnormality of cervix with LGSIL   . Routine/ritual circumcision   . Type II diabetes mellitus ([place])   . Uveitis     Patient Active Problem List   Diagnosis  Date Noted  . Acute hypotension 03/10/2020  . Right ankle injury 09/28/2019  . Bilateral lower extremity edema 06/07/2019  . Atrial fibrillation ([place]) 09/20/2018  . Migraine   . Hematuria 08/24/2018  . Vulvovaginitis 05/21/2018  . (HFpEF) heart failure with preserved ejection fraction ([place]) 01/01/2018  . 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  . High risk medication use 09/16/2017  . Pseudophakia of right eye 09/16/2017  . Left anterior shoulder pain 07/06/2017  . Hypertension associated with diabetes ([place]) 07/23/2016  . 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 Fatigue 04/02/2016  . 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 Vitamin D deficiency 10/30/2014  . Hair loss 03/24/2012  . Healthcare maintenance 02/26/2011  . Hyperlipidemia 05/05/2006  . Type II diabetes mellitus ([place]) 04/22/1998    Past Surgical History:  Procedure Laterality Date  . CESAREAN SECTION       OB History    Gravida  7   Para  4   Term  4   Preterm      AB  3   Living  4     SAB  3   TAB      Ectopic      Multiple      Live Births              Family History  Problem Relation Age  of Onset  . Diabetes Father     Social History   Tobacco Use  . Smoking status: Never Smoker  . Smokeless tobacco: Never Used  Vaping Use  . Vaping Use: Never used  Substance Use Topics  . Alcohol use: No  . Drug use: No    Home Medications Prior to Admission medications   Medication Sig Start Date End Date Taking? Authorizing Provider  Accu-Chek FastClix Lancets MISC Check blood sugar 4 times a day 06/07/19   Jose Persia, MD  acetaminophen (TYLENOL) 500 MG tablet Take 500 mg by mouth every 6 (six) hours as needed for mild pain.    [provider]  apixaban (ELIQUIS) 5 MG TABS tablet Take 1 tablet (5 mg total) by mouth 2 (two) times daily. Patient not taking: Reported on 03/08/2020 02/08/20   Harvie Heck, MD  atorvastatin (LIPITOR) 40 MG tablet Take 1 tablet (40  mg total) by mouth daily. 06/30/19   Jose Persia, MD  Blood Glucose Monitoring Suppl (ACCU-CHEK GUIDE) w/Device KIT 1 each by Does not apply route 4 (four) times daily. 11/19/18   Jose Persia, MD  brimonidine (ALPHAGAN) 0.2 % ophthalmic solution Place 1 drop into the left eye 3 (three) times daily.  Patient not taking: Reported on 01/25/2020    [provider]  dorzolamide-timolol (COSOPT) 22.3-6.8 MG/ML ophthalmic solution Place 1 drop into the left eye 2 (two) times daily.    [provider]  Dulaglutide (TRULICITY) 3.33 LK/5.6YB SOPN Inject 0.75 mg into the skin once a week. 02/08/20   Harvie Heck, MD  furosemide (LASIX) 20 MG tablet Take 1 tablet (20 mg total) by mouth daily as needed (leg swelling). 06/07/19   Jose Persia, MD  glucose blood (ACCU-CHEK GUIDE) test strip Check blood sugar 4 times per day 06/07/19   Jose Persia, MD  ibuprofen (ADVIL) 200 MG tablet Take 400 mg by mouth every 6 (six) hours as needed for headache. Patient not taking: Reported on 03/08/2020    [provider]  insulin aspart (NOVOLOG) 100 UNIT/ML injection Inject 10 Units into the skin 3 (three) times daily with meals. 02/08/20 03/09/20  Harvie Heck, MD  insulin glargine (LANTUS) 100 UNIT/ML injection Inject 0.3 mLs (30 Units total) into the skin daily. 02/08/20 03/09/20  Harvie Heck, MD  Insulin Pen Needle 31G X 5 MM MISC 1 Dose by Does not apply route 2 (two) times daily. 05/20/18   Alphonzo Grieve, MD  ketorolac (ACULAR) 0.5 % ophthalmic solution Place 1 drop into the left eye 4 (four) times daily. 63/8/93   Delora Fuel, MD  lisinopril (ZESTRIL) 10 MG tablet Take 1 tablet (10 mg total) by mouth daily. Patient not taking: Reported on 03/08/2020 06/30/19   Jose Persia, MD  metFORMIN (GLUCOPHAGE) 1000 MG tablet Take 1 tablet (1,000 mg total) by mouth 2 (two) times daily with a meal. 10/13/19   Jose Persia, MD  ondansetron (ZOFRAN) 4 MG tablet Take 1 tablet (4 mg total) by  mouth every 8 (eight) hours as needed for nausea or vomiting. Garrel Ridgel kula 8 saeat hasab alhajat lilghathayan 'aw alqay' 03/10/20   Jose Persia, MD  ferrous sulfate 325 (65 FE) MG tablet Take 1 tablet (325 mg total) by mouth daily. Patient not taking: Reported on 06/12/2018 04/29/17 05/15/19  Alphonzo Grieve, MD    Allergies    Patient has no known allergies.  Review of Systems   Review of Systems  All other systems reviewed and are negative.  Physical Exam Updated Vital Signs BP 137/65 (BP Location: Right Arm)   Pulse 91   Temp 98.8 F (37.1 C) (Oral)   Resp 16   LMP 08/21/2016 (Exact Date)   SpO2 98%   Physical Exam Vitals and nursing note reviewed.  Constitutional:      General: She is not in acute distress.    Appearance: She is well-developed.  HENT:     Head: Normocephalic and atraumatic.  Eyes:     Conjunctiva/sclera: Conjunctivae normal.  Cardiovascular:     Rate and Rhythm: Normal rate and regular rhythm.     Heart sounds: No murmur heard.   Pulmonary:     Effort: Pulmonary effort is normal. No respiratory distress.     Breath sounds: Normal breath sounds.  Abdominal:     Palpations: Abdomen is soft.     Tenderness: There is no abdominal tenderness.     Comments: Mild LLQ discomfort, but no focal tenderness or guarding  Musculoskeletal:        General: Normal range of motion.     Cervical back: Neck supple.  Skin:    General: Skin is warm and dry.  Neurological:     Mental Status: She is alert and oriented to person, place, and time.  Psychiatric:        Mood and Affect: Mood normal.        Behavior: Behavior normal.     ED Results / Procedures / Treatments   Labs (all labs ordered are listed, but only abnormal results are displayed) Labs Reviewed  COMPREHENSIVE METABOLIC PANEL - Abnormal; Notable for the following components:      Result Value   Sodium 129 (*)    Chloride 94 (*)    CO2 14 (*)    Glucose, Bld 412 (*)    BUN 26 (*)     Creatinine, Ser 1.94 (*)    Calcium 8.8 (*)    Albumin 2.7 (*)    AST 11 (*)    Total Bilirubin 1.8 (*)    GFR, Estimated 31 (*)    Anion gap 21 (*)    All other components within normal limits  CBC - Abnormal; Notable for the following components:   Hemoglobin 10.7 (*)    MCHC 29.2 (*)    All other components within normal limits  URINALYSIS, ROUTINE W REFLEX MICROSCOPIC - Abnormal; Notable for the following components:   APPearance CLOUDY (*)    Glucose, UA >=500 (*)    Hgb urine dipstick LARGE (*)    Ketones, ur 20 (*)    Protein, ur 100 (*)    Leukocytes,Ua LARGE (*)    WBC, UA >50 (*)    Bacteria, UA RARE (*)    Non Squamous Epithelial 0-5 (*)    All other components within normal limits  I-STAT BETA HCG BLOOD, ED ([facility], [facility], [facility] ONLY) - Abnormal; Notable for the following components:   I-stat hCG, quantitative 7.4 (*)    All other components within normal limits  CBG MONITORING, ED - Abnormal; Notable for the following components:   Glucose-Capillary 423 (*)    All other components within normal limits  URINE CULTURE  RESPIRATORY PANEL BY RT PCR (FLU A&B, COVID)  LIPASE, BLOOD  BASIC METABOLIC PANEL  BASIC METABOLIC PANEL  BASIC METABOLIC PANEL  BASIC METABOLIC PANEL  BASIC METABOLIC PANEL  BETA-HYDROXYBUTYRIC ACID  BETA-HYDROXYBUTYRIC ACID  BETA-HYDROXYBUTYRIC ACID  I-STAT VENOUS BLOOD GAS, ED  CBG MONITORING, ED  EKG None  Radiology No results found.  Procedures .Critical Care Performed by: Montine Circle, PA-C Authorized by: Montine Circle, PA-C   Critical care provider statement:    Critical care time (minutes):  40   Critical care was necessary to treat or prevent imminent or life-threatening deterioration of the following conditions:  Metabolic crisis   Critical care was time spent personally by me on the following activities:  Discussions with consultants, evaluation of patient's response to treatment, examination of patient, ordering and  performing treatments and interventions, ordering and review of laboratory studies, ordering and review of radiographic studies, pulse oximetry, re-evaluation of patient's condition, obtaining history from patient or surrogate and review of old charts   (including critical care time)  Medications Ordered in ED Medications  lactated ringers bolus 20 mL/kg (has no administration in time range)  insulin regular, human (MYXREDLIN) 100 units/ 100 mL infusion (has no administration in time range)  lactated ringers infusion (has no administration in time range)  dextrose 5 % in lactated ringers infusion (has no administration in time range)  dextrose 50 % solution 0-50 mL (has no administration in time range)  potassium chloride 10 mEq in 100 mL IVPB (has no administration in time range)  ondansetron (ZOFRAN-ODT) disintegrating tablet 4 mg (4 mg Oral Given 03/13/20 1650)    ED Course  I have reviewed the triage vital signs and the nursing notes.  Pertinent labs & imaging results that were available during my care of the patient were reviewed by me and considered in my medical decision making (see chart for details).    MDM Rules/Calculators/A&P                         This patient complains of nausea and vomiting, this involves an extensive number of treatment options, and is a complaint that carries with it a high risk of complications and morbidity.    Differential Dx DKA, hyperglycemia, gastroenteritis, diabetic gastroparesis, UTI, pyelonephritis  Pertinent Labs I reviewed, and interpreted labs, which included CBG is in the 400s, anion gap is 21, bicarb 14, sodium 129, chloride 94, creatinine 1.94, up from 1.27 4 days ago.  Medications I ordered medication insulin per Endo tool and IV fluid for hyperglycemia/DKA and AKI.  Sources Previous records obtained and reviewed show no recent admissions for hypoglycemia/DKA.   Critical Interventions  IV insulin and fluids for DKA and  AKI.  Consultants Internal medicine residents, who are appreciated for admitting.   Plan Admit for DKA and AKI with persistent vomiting and possible UTI.    Final Clinical Impression(s) / ED Diagnoses Final diagnoses:  Hyperglycemia  AKI (acute kidney injury) ([place])  Intractable vomiting with nausea, unspecified vomiting type  Abnormal urinalysis  Diabetic ketoacidosis without coma associated with type 2 diabetes mellitus [facility])    Rx / DC Orders ED Discharge Orders    None       Montine Circle, PA-C 03/14/20 0028    Mesner, Corene Cornea, MD 03/14/20 [phone]

## 2020-03-14 NOTE — H&P (Signed)
Date: 03/14/2020               Patient Name:  Jennifer [name] MRN: [id number]  DOB: 10/08/1970 Age / Sex: 49 y.o., female   PCP: Jose Persia, MD         Medical Service: Internal Medicine Teaching Service         Attending Physician: Dr. Moise Boring    First Contact: Dr. Gaylan Gerold Pager: [phone]  Second Contact: Dr. Mitzi Hansen Pager: [phone]       After Hours (After 5p/  First Contact Pager: [phone]  weekends / holidays): Second Contact Pager: [phone]   Chief Complaint: nausea and vomiting  History of Present Illness:   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  Arabic video interpretor unavailable in the ED. Patient's daughter (at bedside) and son (on the phone) who live with the patient interpreted for the patient and contributed to this history.  The patient states she was in her usual state of health until 5 days ago on 11/17 when she developed dizziness, nausea, and vomiting. She ended up slipping in the shower resulting in a fall and hitting her head, after which she presented to the ED for evaluation. CT head, CT abdomen pelvis were negative for acute traumatic findings. CBC and CMP were unrevealing except for Na 132 and glucose 416. She was then seen at [facility] 4 days ago on 11/19 for follow-up. She was found to be hypotensive with BP 81/53 with MAP of 62 and symptomatic with dizziness and nausea. She was able to tolerate oral fluid intake and BP increased to 154/88. Her standing BP dropped to systolic to the 973 however she was asymptomatic. CBC unremarkable. She did have an AKI with sCr 1.27 up from baseline 0.9. Urinalysis had trace leukocytes however patient was asymptomatic. She was advised to  maximize her oral intake and given a prescription for Zofran.  She reports at rest lying or sitting she is not dizzy, however becomes dizzy with standing. She reports an average of 7 episodes of emesis daily since 11/17 with minimal improvement with Zofran. Notes having an episode of dark red emesis today. Endorses L-sided abdominal pain which started yesterday. Has been drinking pink Gatorade, otherwise has not been able to keep any food or drink down. Has not taken any of her medications, including her insulin. Has had periodic episodes of nausea/vomiting typically associated with migraines over the years but never as long as this. Denies polyuria, polydipsia, fevers, headache, cough, congestion, chest pain, shortness of breath. Often has swollen lower extremities for which she takes Lasix as needed. Unvaccinated against COVID. Denies sick contacts. States she has periodic chills with sweating. Her children report that at baseline she has poor balance and history of falls.  ED Course: Afebrile (Tmax 100 F), P 12-18, BP ranged from normotensive to hypertensive, with SpO2 >95% on room air. Pertinent labs include Na 129 (corrected 134), K 4.5, Bicarb 14 with AG 21, sCr 1.94 (baseline ~0.9) with eGFR 31, AST/ALT/ALP 03/03/95, Tbili 1.8, hemoglobin 10.7 with MCV 91, lipase 19. I-stat pH 7.24. Urinalysis with  glucosuria, ketonuria (20), proteinuria (100), large leuks, >50 WBC, rare bacteria, 6-10 squamous epithelial cells present. RPP negative. Patient given 4 mg Zofran and started on Endo tool and IV  fluids, bolus and mIVF. Also given ceftriaxone 1 g IV.  Meds:  Current Facility-Administered Medications for the 03/13/20 encounter [facility] Encounter)  Medication  . sodium chloride 0.9 % bolus 1,000 mL   Current Meds  Medication Sig  . acetaminophen (TYLENOL) 500 MG tablet Take 500 mg by mouth every 6 (six) hours as needed for mild pain.  Marland Kitchen atorvastatin (LIPITOR) 40 MG tablet Take 1 tablet (40 mg total)  by mouth daily.  . dorzolamide-timolol (COSOPT) 22.3-6.8 MG/ML ophthalmic solution Place 1 drop into the left eye 2 (two) times daily.  . Dulaglutide (TRULICITY) 5.10 CH/8.5ID SOPN Inject 0.75 mg into the skin once a week.  . DULoxetine (CYMBALTA) 60 MG capsule Take 60 mg by mouth daily.  . furosemide (LASIX) 20 MG tablet Take 1 tablet (20 mg total) by mouth daily as needed (leg swelling).  . insulin NPH Human (NOVOLIN N) 100 UNIT/ML injection Inject 35-45 Units into the skin See admin instructions. 45 units in the morning, 35 units in the evening  . ketorolac (ACULAR) 0.5 % ophthalmic solution Place 1 drop into the left eye 4 (four) times daily.  Marland Kitchen lisinopril (ZESTRIL) 10 MG tablet Take 1 tablet (10 mg total) by mouth daily.  . metFORMIN (GLUCOPHAGE) 1000 MG tablet Take 1 tablet (1,000 mg total) by mouth 2 (two) times daily with a meal.  . Multiple Vitamins-Minerals (MULTIVITAMIN WITH MINERALS) tablet Take 1 tablet by mouth daily.  Marland Kitchen zinc gluconate 50 MG tablet Take 50 mg by mouth daily.   Social History: Lives with children. Per children, mostly stays at home, does not work. No cigarette, marijuana, cocaine, or alcohol use.  Family History: DM and HTN in both mother and father. No known family history of cancer.  Allergies: Allergies as of 03/13/2020  . (No Known Allergies)   Past Medical History:  Diagnosis Date  . Anemia, iron deficiency   . Atrial fibrillation ([place])   . Blindness of left eye   . Decreased visual acuity    Left eye  . 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 Hair loss   . History of fracture of clavicle 05/18/2015  . Hyperlipidemia   . Iron deficiency anemia 05/13/2013  . Pain, dental 08/19/2018   Tooth pain/facial swelling: has poor dentition at baseline, history of dental abscess.  She has not seen a dentist in about one year.   Dentist is on [address].  No fevers chills or systemic symptoms.  Diabetes has been poorly controlled for some time.  She said it has improved recently averaging around 140.  Called the dentist said they would not see patients until May 14th but the urgency o  . Pap smear abnormality of cervix with LGSIL   . Routine/ritual circumcision   . Type II diabetes mellitus ([place])   . Uveitis     Review of Systems: A complete ROS was negative except as per HPI.   Physical Exam: Blood pressure (!) 135/47, pulse 95, temperature 98.8 F (37.1 C), temperature source Oral, resp. rate (!) 25, last menstrual period 08/21/2016, SpO2 98 %. Constitutional: tired-appearing woman lying in bed, in no acute distress HENT: normocephalic atraumatic, mucous membranes dry Eyes: conjunctiva non-erythematous; L eye hazy-appearing; R pupil round and minimally reactive to light Neck: supple, no lymphadenopathy Cardiovascular: regular rate and rhythm, systolic murmur, no rubs or gallops; no lower extremity edema; no JVD;  2+ anterior tibialis pulses bilaterally Pulmonary/Chest: normal work of breathing on room air, lungs clear to auscultation bilaterally Abdominal: soft, non-distended, mild tenderness to palpation over LLL MSK: normal bulk and tone Neurological: alert & oriented x 3; sensation grossly intact Skin: Cool extremities, dry skin; L 2nd toe finding below - dime-sized dry ulceration with surrounding darkened skin, no discharge or erythema, no tenderness to palpation Psych: normal mood and affect   Labs: CBC    Component Value Date/Time   WBC 9.2 03/13/2020 1647   RBC 4.02 03/13/2020 1647   HGB 17.7 (H) 03/14/2020 0158   HGB 12.2 01/20/2019 1148   HCT 52.0 (H) 03/14/2020 0158   HCT 39.1 01/20/2019 1148   PLT 218 03/13/2020 1647   PLT 186 01/20/2019 1148   MCV 91.0 03/13/2020 1647   MCV 84 01/20/2019 1148   MCH 26.6 03/13/2020 1647   MCHC 29.2 (L) 03/13/2020 1647   RDW 11.9 03/13/2020 1647    RDW 12.1 01/20/2019 1148   LYMPHSABS 1.3 03/10/2020 1448   MONOABS 0.4 03/10/2020 1448   EOSABS 0.2 03/10/2020 1448   BASOSABS 0.0 03/10/2020 1448     CMP     Component Value Date/Time   NA 128 (L) 03/14/2020 0158   NA 135 03/03/2019 1533   K 4.4 03/14/2020 0158   CL 92 (L) 03/14/2020 0117   CO2 15 (L) 03/14/2020 0117   GLUCOSE 444 (H) 03/14/2020 0117   BUN 31 (H) 03/14/2020 0117   BUN 23 03/03/2019 1533   CREATININE 2.15 (H) 03/14/2020 0117   CREATININE 0.63 10/28/2014 1649   CALCIUM 8.7 (L) 03/14/2020 0117   PROT 7.5 03/13/2020 1647   PROT 6.8 01/30/2018 1440   ALBUMIN 2.7 (L) 03/13/2020 1647   ALBUMIN 3.6 01/30/2018 1440   AST 11 (L) 03/13/2020 1647   ALT 11 03/13/2020 1647   ALKPHOS 96 03/13/2020 1647   BILITOT 1.8 (H) 03/13/2020 1647   BILITOT <0.2 01/30/2018 1440   GFRNONAA 28 (L) 03/14/2020 0117   GFRNONAA >89 10/28/2014 1649   GFRAA 88 03/03/2019 1533   GFRAA >89 10/28/2014 1649    Imaging: none  EKG: none  Assessment & Plan by Problem: Active Problems:   DKA (diabetic ketoacidosis) ([place])   Ms. 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  Diabetic ketoacidosis Nausea, vomiting History of poorly controlled type II diabetes mellitus Hyponatremia On evaluation in the ED, blood glucose 412, ketonuria, I-stat pH of 7.24. Patient and family report adherence to her diabetes medications prior to onset of dizziness, nausea, and vomiting 5 days ago, though per chart review, patient has a history of non-adherence to her medications (dulaglutide 0.75 mg daily, metformin 1000 mg twice daily, Lantus 30 units daily, novolog  10 units TID with meals) and poorly controlled diabetes, last A1c >14 on 10/19. Hard to differentiate whether poor glucose control precipitated N/V or whether N/V were precipitated by another factor such as diabetic gastroparesis. Regardless, nausea and vomiting likely exacerbated by DKA. Other possible contributory factors include infection, though the patient has been afebrile, without leukocytosis, no respiratory complaints. UTI with large leuks, >50 WBC, rare bacteria, 6-10 squamous epithelial cells present, though patient is asymptomatic, urine culture pending. She is at increased risk  for UTI given her poorly controlled DM. S/p ceftriaxone in the ED. Low suspicion for ACS or heart failure exacerbation. Will screen with TSH given reported history of cold intolerance. Will manage DKA with fluids and Endotool and trend labs as below.  - Endotool - potassium supplementation as needed - Normal saline at 125 mL/hr until CBG less than 250 - Switch to D5-1/2 NS when 1 CBG less than 250 - Nothing by mouth  - BMP every 4 hours - CBG Q1H - Once anion gap closed 2, start CM diet and if able to eat, administer Lantus 22 units - Continue insulin drip for 1-2 more hours, then discontinue and start SSI-S  - DC fluids if eating, drinking, and off insulin drip - Monitor beta-hydroxybutyric acid - TSH - F/u urine culture  Acute kidney injury Creatinine 1.94 with eGFR 31 on admission. Baseline sCr ~0.9. Suspect pre-renal in setting of dehydration from vomiting and poor PO intake vs diabetic nephropathy. Will trend BMP. - AM BMP  Left 2nd toe ulcer Patient states the ulcer has been present for ~3 months without a known trauma to the area. Anterior tibialis pulses 2+ bilaterally. Denies pain. Surrounding darkening of skin concerning for possible necrosis vs osteomyelitis. Likely secondary to diabetic microvascular disease. Will obtain xray to evaluate for osteo. - Xray of the left foot  History of  falls Dizziness Patient and family report a history of poor balance and falls, ~5 in the last year. Likely multifactorial in the setting of partial blindness, orthostasis, possible diabetic peripheral neuropathy. Differential also includes CVA given patient's comorbid risk factors, though less likely. Dysautonomia from poorly controlled diabetes could also be contributing. - orthostatic vitals  HFpEF Most recent echo in 08/2018 with EF 50-55%. Prescribed Lasix 20 mg PO daily as needed for lower extremity swelling which patient and family report is frequent. Patient has not been taking any of her mediations for the last 5 days. - Hold home Lasix in setting of DKA/dehydration/AKI  Hypertension Prescribed lisinopril 10 mg daily. - Start home lisinopril 10 mg daily once patient tolerates PO   Hyperlipidemia On atorvastatin 40 mg daily. - Start home atorvastatin 40 mg daily once tolerating PO  Atrial fibrillation Prescribed Eliquis 5 mg daily. - Start home Eliquis 5 mg daily once tolerating PO - Currently on Lovenox for DVT ppx  Normocytic anemia Hgb 11.8 with MCV 85.3. Suspect reported red emesis secondary to Gatorade patient has been drinking. Patient has a history of iron deficiency anemia requiring PO iron supplementation.  - AM CBC w/ diff   Diet: NPO as above VTE: Enoxaparin IVF: NS,125cc/hr Code: Full  Prior to Admission Living Arrangement: Home  Anticipated Discharge Location: Home Barriers to Discharge: treatment of DKA  Dispo: Admit patient to Inpatient with expected length of stay greater than 2 midnights.  Signed: Alexandria Lodge, MD PGY-1 Internal Medicine Teaching Service Pager: [phone] 03/14/2020

## 2020-03-14 NOTE — Significant Event (Signed)
I was notified of a fever of 101.3.  When evaluated at bedside, patient appears comfortable with no acute distress.  She still feels nauseous and vomited 1 hour ago.  Stated that her vomit was normal and nonbloody.  Her left lower quadrant abdominal pain has slightly improved.  She did urinate and denies dysuria.  Abdominal exam remarkable for lower left quadrant tenderness to palpation, no guarding, and negative Murphy sign.  Her abdomen is soft.  Plan: -Obtain BMP, LFT, blood culture -Zofran as needed for nausea -Advance diet slowly -May consider imaging of abdomen if worsening abdominal pain and fever

## 2020-03-14 NOTE — Progress Notes (Signed)
   03/14/20 1535  Assess: MEWS Score  Temp 99.9 F (37.7 C)  BP (!) 153/53  Pulse Rate (!) 101  ECG Heart Rate (!) 101  Resp (!) 26  Level of Consciousness Alert  SpO2 99 %  Assess: MEWS Score  MEWS Temp 0  MEWS Systolic 0  MEWS Pulse 1  MEWS RR 2  MEWS LOC 0  MEWS Score 3  MEWS Score Color Yellow  Treat  MEWS Interventions Administered scheduled meds/treatments  Pain Scale 0-10  Pain Score 0  Take Vital Signs  Increase Vital Sign Frequency  Yellow: Q 2hr X 2 then Q 4hr X 2, if remains yellow, continue Q 4hrs  Escalate  MEWS: Escalate Yellow: discuss with charge nurse/RN and consider discussing with provider and RRT  Notify: Charge Nurse/RN  Name of Charge Nurse/RN Notified Katie  Date Charge Nurse/RN Notified 03/14/20  Time Charge Nurse/RN Notified 1612  Document  Patient Outcome Other (Comment) (Continuing to monitor)  Progress note created (see row info) Yes

## 2020-03-14 NOTE — Progress Notes (Signed)
Internal Medicine Clinic Attending  Case discussed with Dr. Basaraba  At the time of the visit.  We reviewed the resident's history and exam and pertinent patient test results.  I agree with the assessment, diagnosis, and plan of care documented in the resident's note.  

## 2020-03-14 NOTE — Progress Notes (Signed)
Daughter at bedside.

## 2020-03-15 ENCOUNTER — Observation Stay (HOSPITAL_COMMUNITY): Payer: Medicaid Other

## 2020-03-15 ENCOUNTER — Inpatient Hospital Stay (HOSPITAL_COMMUNITY): Payer: Medicaid Other

## 2020-03-15 DIAGNOSIS — L03116 Cellulitis of left lower limb: Secondary | ICD-10-CM | POA: Diagnosis not present

## 2020-03-15 DIAGNOSIS — E1110 Type 2 diabetes mellitus with ketoacidosis without coma: Secondary | ICD-10-CM | POA: Diagnosis not present

## 2020-03-15 DIAGNOSIS — I10 Essential (primary) hypertension: Secondary | ICD-10-CM | POA: Diagnosis not present

## 2020-03-15 DIAGNOSIS — I4891 Unspecified atrial fibrillation: Secondary | ICD-10-CM | POA: Diagnosis not present

## 2020-03-15 LAB — C-REACTIVE PROTEIN: CRP: 34.7 mg/dL — ABNORMAL HIGH (ref <1.0)

## 2020-03-15 LAB — URINE CULTURE

## 2020-03-15 LAB — BASIC METABOLIC PANEL
Anion gap: 14 (ref 5–15)
BUN: 24 mg/dL — ABNORMAL HIGH (ref 6–20)
CO2: 21 mmol/L — ABNORMAL LOW (ref 22–32)
Calcium: 8.5 mg/dL — ABNORMAL LOW (ref 8.9–10.3)
Chloride: 99 mmol/L (ref 98–111)
Creatinine, Ser: 1.27 mg/dL — ABNORMAL HIGH (ref 0.44–1.00)
GFR, Estimated: 52 mL/min — ABNORMAL LOW (ref >60)
Glucose, Bld: 366 mg/dL — ABNORMAL HIGH (ref 70–99)
Potassium: 4 mmol/L (ref 3.5–5.1)
Sodium: 134 mmol/L — ABNORMAL LOW (ref 135–145)

## 2020-03-15 LAB — LACTIC ACID, PLASMA
Lactic Acid, Venous: 1.1 mmol/L (ref 0.5–1.9)
Lactic Acid, Venous: 1.6 mmol/L (ref 0.5–1.9)

## 2020-03-15 LAB — CBC
HCT: 31.4 % — ABNORMAL LOW (ref 36.0–46.0)
Hemoglobin: 10 g/dL — ABNORMAL LOW (ref 12.0–15.0)
MCH: 27 pg (ref 26.0–34.0)
MCHC: 31.8 g/dL (ref 30.0–36.0)
MCV: 84.6 fL (ref 80.0–100.0)
Platelets: 181 10*3/uL (ref 150–400)
RBC: 3.71 MIL/uL — ABNORMAL LOW (ref 3.87–5.11)
RDW: 12.2 % (ref 11.5–15.5)
WBC: 5.4 10*3/uL (ref 4.0–10.5)
nRBC: 0 % (ref 0.0–0.2)

## 2020-03-15 LAB — SEDIMENTATION RATE: Sed Rate: 106 mm/hr — ABNORMAL HIGH (ref 0–22)

## 2020-03-15 LAB — GLUCOSE, CAPILLARY
Glucose-Capillary: 354 mg/dL — ABNORMAL HIGH (ref 70–99)
Glucose-Capillary: 298 mg/dL — ABNORMAL HIGH (ref 70–99)
Glucose-Capillary: 137 mg/dL — ABNORMAL HIGH (ref 70–99)
Glucose-Capillary: 89 mg/dL (ref 70–99)
Glucose-Capillary: 149 mg/dL — ABNORMAL HIGH (ref 70–99)

## 2020-03-15 LAB — FERRITIN: Ferritin: 166 ng/mL (ref 11–307)

## 2020-03-15 LAB — MRSA PCR SCREENING: MRSA by PCR: POSITIVE — AB

## 2020-03-15 LAB — PROCALCITONIN: Procalcitonin: 1.52 ng/mL

## 2020-03-15 MED ORDER — DULOXETINE HCL 60 MG PO CPEP
60.0000 mg | ORAL_CAPSULE | Freq: Every day | ORAL | Status: DC
  Administered 2020-03-15 – 2020-03-18 (×4): 60 mg via ORAL
  Filled 2020-03-15 (×4): qty 1

## 2020-03-15 MED ORDER — ATORVASTATIN CALCIUM 40 MG PO TABS
40.0000 mg | ORAL_TABLET | Freq: Every day | ORAL | Status: DC
  Administered 2020-03-15 – 2020-03-18 (×4): 40 mg via ORAL
  Filled 2020-03-15 (×4): qty 1

## 2020-03-15 MED ORDER — VANCOMYCIN HCL 1.25 G IV SOLR
1250.0000 mg | Freq: Two times a day (BID) | INTRAVENOUS | Status: DC
Start: 2020-03-15 — End: 2020-03-15

## 2020-03-15 MED ORDER — INSULIN GLARGINE 100 UNIT/ML ~~LOC~~ SOLN
40.0000 [IU] | Freq: Every day | SUBCUTANEOUS | Status: DC
  Administered 2020-03-15 – 2020-03-18 (×4): 40 [IU] via SUBCUTANEOUS
  Filled 2020-03-15 (×4): qty 0.4

## 2020-03-15 MED ORDER — MUPIROCIN 2 % EX OINT
1.0000 "application " | TOPICAL_OINTMENT | Freq: Two times a day (BID) | CUTANEOUS | Status: DC
  Administered 2020-03-15 – 2020-03-18 (×7): 1 via NASAL
  Filled 2020-03-15: qty 22

## 2020-03-15 MED ORDER — VANCOMYCIN HCL 1.5 G IV SOLR
1500.0000 mg | Freq: Once | INTRAVENOUS | Status: AC
  Administered 2020-03-15: 1500 mg via INTRAVENOUS
  Filled 2020-03-15: qty 1500

## 2020-03-15 MED ORDER — VANCOMYCIN HCL 1.25 G IV SOLR: 1250.0000 mg | Freq: Two times a day (BID) | INTRAVENOUS | Status: DC

## 2020-03-15 MED ORDER — INSULIN GLARGINE 100 UNIT/ML ~~LOC~~ SOLN
25.0000 [IU] | SUBCUTANEOUS | Status: DC
  Filled 2020-03-15: qty 0.25

## 2020-03-15 MED ORDER — VANCOMYCIN HCL 1250 MG/250ML IV SOLN
1250.0000 mg | Freq: Two times a day (BID) | INTRAVENOUS | Status: DC
  Administered 2020-03-15 – 2020-03-16 (×3): 1250 mg via INTRAVENOUS
  Filled 2020-03-15 (×4): qty 250

## 2020-03-15 MED ORDER — GADOBUTROL 1 MMOL/ML IV SOLN
9.5000 mL | Freq: Once | INTRAVENOUS | Status: AC | PRN
  Administered 2020-03-15: 9.5 mL via INTRAVENOUS

## 2020-03-15 MED ORDER — BISMUTH SUBSALICYLATE 262 MG PO CHEW
524.0000 mg | CHEWABLE_TABLET | Freq: Once | ORAL | Status: AC
  Administered 2020-03-16: 524 mg via ORAL
  Filled 2020-03-15: qty 2

## 2020-03-15 MED ORDER — INSULIN ASPART 100 UNIT/ML ~~LOC~~ SOLN
10.0000 [IU] | Freq: Once | SUBCUTANEOUS | Status: AC
  Administered 2020-03-15: 10 [IU] via SUBCUTANEOUS

## 2020-03-15 MED ORDER — CHLORHEXIDINE GLUCONATE CLOTH 2 % EX PADS
6.0000 | MEDICATED_PAD | Freq: Every day | CUTANEOUS | Status: DC
  Administered 2020-03-16 – 2020-03-18 (×3): 6 via TOPICAL

## 2020-03-15 MED ORDER — SODIUM CHLORIDE 0.9 % IV SOLN
2.0000 g | Freq: Three times a day (TID) | INTRAVENOUS | Status: DC
  Administered 2020-03-15 – 2020-03-17 (×6): 2 g via INTRAVENOUS
  Filled 2020-03-15 (×8): qty 2

## 2020-03-15 MED ORDER — INSULIN GLARGINE 100 UNIT/ML ~~LOC~~ SOLN
22.0000 [IU] | Freq: Two times a day (BID) | SUBCUTANEOUS | Status: DC
  Filled 2020-03-15 (×2): qty 0.22

## 2020-03-15 NOTE — Progress Notes (Signed)
RN notified IMTS MD Shon Baton) of RED MEWS alert. Temp 102.7, HR in 120s, RR in mid to high 20s. Vitals q1x4. Temp recheck in a hour. Tylenol administered

## 2020-03-15 NOTE — Plan of Care (Signed)
  Problem: Metabolic: Goal: Ability to maintain appropriate glucose levels will improve Outcome: Progressing   Problem: Respiratory: Goal: Will regain and/or maintain adequate ventilation Outcome: Progressing   Problem: Urinary Elimination: Goal: Ability to achieve and maintain adequate renal perfusion and functioning will improve Outcome: Progressing

## 2020-03-15 NOTE — CV Procedure (Signed)
Echocardiogram not completed, patient is taking a shower.  Darlina Sicilian RDCS

## 2020-03-15 NOTE — Progress Notes (Addendum)
Inpatient Diabetes Program Recommendations  AACE/ADA: New Consensus Statement on Inpatient Glycemic Control (2015)  Target Ranges:  Prepandial:   less than 140 mg/dL      Peak postprandial:   less than 180 mg/dL (1-2 hours)      Critically ill patients:  140 - 180 mg/dL   Lab Results  Component Value Date   GLUCAP 298 (H) 03/15/2020   HGBA1C >14.0 (A) 02/08/2020    Review of Glycemic Control Results for Jennifer [name], Jennifer [name] (MRN [id number]) as of 03/15/2020 09:31  Ref. Range 03/14/2020 15:43 03/14/2020 17:22 03/14/2020 21:02 03/15/2020 07:33 03/15/2020 09:16  Glucose-Capillary Latest Ref Range: 70 - 99 mg/dL 185 (H) 195 (H) 281 (H) 354 (H) 298 (H)   Diabetes history: DM2 Outpatient Diabetes medications:  Trulicity 2.83 mg weekly, Lantus 30 units daily, Novolog 10 units tid with meals Current orders for Inpatient glycemic control:  Lantus 40 units daily, Novolog resistant tid with meals  Inpatient Diabetes Program Recommendations:    Agree with current orders.  Will attempt to speak with her today regarding home DM management. It appears that patient was referred for education however it does not appear that she was able to attend?   Thanks  Adah Perl, RN, BC-ADM Inpatient Diabetes Coordinator Pager [phone] (8a-5p)  1500 Attempted to speak with patient.  She was in the shower.  Called Daughter but no answer.  Will follow.  Patient is well-established at clinic.

## 2020-03-15 NOTE — Progress Notes (Signed)
Pharmacy Antibiotic Note  Yarelie Hams is a 49 y.o. female with N/V and fevers.  Pharmacy has been consulted for Vancomycin and Cefepime  dosing.  Plan: Vancomycin 1500 mg IV now, then Vancomycin 1250 mg IV q12h Cefepime 2 g IV q8h  Height: 5\' 6"  (167.6 cm) Weight: 91.6 kg (202 lb) IBW/kg (Calculated) : 59.3  Temp (24hrs), Avg:100.6 F (38.1 C), Min:99.8 F (37.7 C), Max:102.7 F (39.3 C)  Recent Labs  Lab 03/08/20 1227 03/08/20 1227 03/10/20 1448 03/10/20 1448 03/13/20 1647 03/13/20 1647 03/14/20 0117 03/14/20 0337 03/14/20 0801 03/14/20 1127 03/14/20 1638  WBC 4.6  --  4.8  --  9.2  --   --  8.9  --   --   --   CREATININE 1.00   < > 1.27*   < > 1.94*   < > 2.15* 1.93* 1.60* 1.28* 1.19*   < > = values in this interval not displayed.    Estimated Creatinine Clearance: 65.9 mL/min (A) (by C-G formula based on SCr of 1.19 mg/dL (H)).    No Known Allergies  Caryl Pina 03/15/2020 2:10 AM

## 2020-03-15 NOTE — Plan of Care (Signed)
  Problem: Health Behavior/Discharge Planning: Goal: Ability to manage health-related needs will improve Outcome: Progressing   

## 2020-03-15 NOTE — Progress Notes (Signed)
Subjective:   Hospital day: 1  Overnight event: Patient continues to spike fevers overnight with Tmax 102.7. CBC, BMP and LFT unremarkable. Pending blood culture. CT abd was ordered but not performed due patient's inability to finish oral contrast.   Did have some abd pain overnight but feels better this morning. Also nauseated overnight. No shortness of breath. No dysuria. Last BM this morning--no diarrhea or blood in it.  Objective:  Vital signs in last 24 hours: Vitals:   03/15/20 0300 03/15/20 0316 03/15/20 0327 03/15/20 0332  BP:  (!) 119/56  (!) 119/58  Pulse:  93  94  Resp:  (!) 27  (!) 25  Temp:   99.5 F (37.5 C)   TempSrc:   Oral   SpO2: 91%   92%  Weight:      Height:       CBC Latest Ref Rng & Units 03/15/2020 03/14/2020 03/14/2020  WBC 4.0 - 10.5 K/uL 5.4 8.9 -  Hemoglobin 12.0 - 15.0 g/dL 10.0(L) 11.0(L) 17.7(H)  Hematocrit 36 - 46 % 31.4(L) 34.9(L) 52.0(H)  Platelets 150 - 400 K/uL 181 213 -   CMP Latest Ref Rng & Units 03/14/2020 03/14/2020 03/14/2020  Glucose 70 - 99 mg/dL - 179(H) 218(H)  BUN 6 - 20 mg/dL - 24(H) 25(H)  Creatinine 0.44 - 1.00 mg/dL - 1.19(H) 1.28(H)  Sodium 135 - 145 mmol/L - 134(L) 134(L)  Potassium 3.5 - 5.1 mmol/L - 3.6 3.7  Chloride 98 - 111 mmol/L - 97(L) 97(L)  CO2 22 - 32 mmol/L - 25 25  Calcium 8.9 - 10.3 mg/dL - 8.8(L) 8.6(L)  Total Protein 6.5 - 8.1 g/dL 6.2(L) - -  Total Bilirubin 0.3 - 1.2 mg/dL 0.4 - -  Alkaline Phos 38 - 126 U/L 107 - -  AST 15 - 41 U/L 20 - -  ALT 0 - 44 U/L 12 - -     Physical Exam  Physical Exam Constitutional:      General: She is not in acute distress. HENT:     Head: Normocephalic.  Eyes:     General:        Right eye: No discharge.        Left eye: No discharge.  Neck:     Comments: Negative for cervical lymphadenopathy Normal oral mucosa No neck tenderness to palpation Cardiovascular:     Rate and Rhythm: Normal rate and regular rhythm.     Heart sounds: Normal heart sounds.   Pulmonary:     Effort: No respiratory distress.     Comments: Mild crackles bibasilar Abdominal:     General: Bowel sounds are normal.     Palpations: Abdomen is soft.     Tenderness: There is abdominal tenderness (epigastric).  Musculoskeletal:     Cervical back: Normal range of motion.     Right lower leg: No edema.     Left lower leg: No edema.     Comments: No pain to palpation of left 2nd toe. No obvious skin changes of bilateral feet  Neurological:     Mental Status: She is alert.  Psychiatric:        Mood and Affect: Mood normal.     Assessment/Plan: Ms. 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  Principal Problem:   DKA (diabetic ketoacidosis) ([place]) Active Problems:   Type II diabetes mellitus ([place])   Hypertension associated with diabetes ([place])   (HFpEF) heart failure with preserved ejection fraction (HCC)   Atrial fibrillation ([place])   Acute kidney injury ([place])   Diabetic ketoacidosis History of poorly controlled type II diabetes mellitus. A1C > 14 Patient was transition of the Endo tool to subcu Lantus. Glucoses remain above goal so increase to Lactus 40 units daily. Repeat glucose at 11 AM was 137.  -increase Lantus to 40U daily -continue resistant SSI -Monitor CBG   Fever Left lower quadrant abdominal pain Unclear source of infection.  Chest x-ray shows pulmonary congestion without any consolidations.  Low suspicion for aspiration pneumonia.  UTI is a differential however patient denies dysuria x 2.  Suspicion for diverticulitis with LLQ pain and fever, will obtain CT abdomen without contrast.  Per chart review,  she had a CT abdomen pelvis on 11/17/121 and it was normal however can not exclude interval development of intraabdominal infection. Lactic acid level normal.  -Continue vanc and cefepime for now. Will discontinue vanc if MRSA PCR is negative. Can transition to Ceftriaxone tomorrow.  -Consider adding on anaerobic coverage with flagyl if confirmed intra-abdominal source on CT.  -Pending CT abdomen -F/u CRP -Follow blood culture. Negative up to date -F/u urine culture -Consider echocardiogram for evaluation of endocarditis if source remains unclear   Acute kidney injury-improving Baseline sCr ~0.9. Suspect pre-renal in setting of dehydration from vomiting and poor PO intake vs diabetic nephropathy.   -Trend creatinine 1.9-1.6-1.28-1.19 -Encourage PO intake  -Holding Lasix, Lisinopril    Left 2nd toe ulcer Patient states the ulcer has been present for ~3 months without a known trauma to the area. Anterior tibialis pulses 2+ bilaterally. Denies pain.  X-ray negative for osteomyelitis. No surrounding erythema or edema to suggest active infection. -Podiatry outpatient follow-up   Elevated beta-hCG Elevated beta hCG on i-STAT.  Quantitative beta-hCG came back slightly elevated at 5. Likely related to post-menopausal sate. No adenexal masses appreciated on CT from 11/17. Low sus for trophoblastic disease or neoplasm.  -Follow up OB/GYN outpatient   Normocytic anemia Hgb 11.8 with MCV 85.3. Suspect reported red emesis secondary to Gatorade patient has been drinking. Patient has a history of iron deficiency anemia requiring PO iron supplementation. Hgb slight drop from 11 to 10, could be hemodilution.  - CBC in AM   History of falls Dizziness Patient and family report a history of poor balance and falls, ~5 in the last year. Likely multifactorial in the setting of partial blindness, orthostasis, possible diabetic peripheral neuropathy. Differential also includes CVA given patient's  comorbid risk factors, though less likely. Dysautonomia from poorly controlled diabetes could also be contributing. - orthostatic vitals - PT/OT   HFpEF Hypertension Most recent echo in 08/2018 with EF 50-55%.  - Hold home Lasix in setting of AKI and hypotension - Hold Lisnopril in the setting of hypotension    Hyperlipidemia - resume atorvastatin 40 mg daily.   Atrial fibrillation -Will hold Eliquis in the setting of unknown hematemesis - Currently on Lovenox for DVT ppx   Diet: Carb modified IVF: NA VTE: Lovenox CODE: Full  Prior to Admission Living Arrangement: Home Anticipated Discharge Location: To be determined Barriers to Discharge: medical treatment Dispo: Anticipated discharge in approximately 1-2 day(s).   Gaylan Gerold, DO 03/15/2020, 5:58 AM Pager: [phone] After 5pm on weekdays and 1pm on weekends: On Call pager [phone]

## 2020-03-15 NOTE — Progress Notes (Signed)
RN notified CT about patients inability to finish contrast. Pt is drowsy after eventful night. Contrast held until day shift. Dr. Gilford Rile notified via CT.

## 2020-03-16 ENCOUNTER — Inpatient Hospital Stay (HOSPITAL_COMMUNITY): Payer: Medicaid Other

## 2020-03-16 DIAGNOSIS — I361 Nonrheumatic tricuspid (valve) insufficiency: Secondary | ICD-10-CM | POA: Diagnosis not present

## 2020-03-16 DIAGNOSIS — R509 Fever, unspecified: Secondary | ICD-10-CM

## 2020-03-16 DIAGNOSIS — R42 Dizziness and giddiness: Secondary | ICD-10-CM

## 2020-03-16 DIAGNOSIS — I340 Nonrheumatic mitral (valve) insufficiency: Secondary | ICD-10-CM | POA: Diagnosis not present

## 2020-03-16 DIAGNOSIS — D500 Iron deficiency anemia secondary to blood loss (chronic): Secondary | ICD-10-CM

## 2020-03-16 LAB — ECHOCARDIOGRAM COMPLETE
Area-P 1/2: 4.54 cm2
Height: 66 in
S' Lateral: 3.1 cm
Single Plane A4C EF: 64.5 %
Weight: 3232 oz

## 2020-03-16 LAB — CBC
HCT: 30.6 % — ABNORMAL LOW (ref 36.0–46.0)
Hemoglobin: 9.5 g/dL — ABNORMAL LOW (ref 12.0–15.0)
MCH: 26.2 pg (ref 26.0–34.0)
MCHC: 31 g/dL (ref 30.0–36.0)
MCV: 84.3 fL (ref 80.0–100.0)
Platelets: 208 10*3/uL (ref 150–400)
RBC: 3.63 MIL/uL — ABNORMAL LOW (ref 3.87–5.11)
RDW: 12.2 % (ref 11.5–15.5)
WBC: 6.6 10*3/uL (ref 4.0–10.5)
nRBC: 0 % (ref 0.0–0.2)

## 2020-03-16 LAB — BASIC METABOLIC PANEL
Anion gap: 12 (ref 5–15)
BUN: 21 mg/dL — ABNORMAL HIGH (ref 6–20)
CO2: 23 mmol/L (ref 22–32)
Calcium: 8.6 mg/dL — ABNORMAL LOW (ref 8.9–10.3)
Chloride: 101 mmol/L (ref 98–111)
Creatinine, Ser: 0.86 mg/dL (ref 0.44–1.00)
GFR, Estimated: >60 mL/min (ref >60)
Glucose, Bld: 214 mg/dL — ABNORMAL HIGH (ref 70–99)
Potassium: 3.9 mmol/L (ref 3.5–5.1)
Sodium: 136 mmol/L (ref 135–145)

## 2020-03-16 LAB — GLUCOSE, CAPILLARY
Glucose-Capillary: 175 mg/dL — ABNORMAL HIGH (ref 70–99)
Glucose-Capillary: 202 mg/dL — ABNORMAL HIGH (ref 70–99)
Glucose-Capillary: 117 mg/dL — ABNORMAL HIGH (ref 70–99)
Glucose-Capillary: 152 mg/dL — ABNORMAL HIGH (ref 70–99)

## 2020-03-16 MED ORDER — LISINOPRIL 10 MG PO TABS
10.0000 mg | ORAL_TABLET | Freq: Every day | ORAL | Status: DC
  Administered 2020-03-17 – 2020-03-18 (×2): 10 mg via ORAL
  Filled 2020-03-16 (×2): qty 1

## 2020-03-16 MED ORDER — PANTOPRAZOLE SODIUM 40 MG PO TBEC
40.0000 mg | DELAYED_RELEASE_TABLET | Freq: Every day | ORAL | Status: DC
  Administered 2020-03-16 – 2020-03-18 (×3): 40 mg via ORAL
  Filled 2020-03-16 (×3): qty 1

## 2020-03-16 MED ORDER — LISINOPRIL 10 MG PO TABS: 10.0000 mg | ORAL_TABLET | Freq: Every day | ORAL | Status: DC

## 2020-03-16 MED ORDER — FUROSEMIDE 20 MG PO TABS: 20.0000 mg | ORAL_TABLET | Freq: Every day | ORAL | Status: DC | PRN

## 2020-03-16 MED ORDER — APIXABAN 5 MG PO TABS
5.0000 mg | ORAL_TABLET | Freq: Two times a day (BID) | ORAL | Status: DC
  Administered 2020-03-16 – 2020-03-18 (×5): 5 mg via ORAL
  Filled 2020-03-16 (×4): qty 1

## 2020-03-16 NOTE — Plan of Care (Signed)
  Problem: Cardiac: Goal: Ability to maintain an adequate cardiac output will improve Outcome: Progressing   Problem: Metabolic: Goal: Ability to maintain appropriate glucose levels will improve Outcome: Progressing   Problem: Respiratory: Goal: Will regain and/or maintain adequate ventilation Outcome: Progressing

## 2020-03-16 NOTE — Plan of Care (Signed)

## 2020-03-16 NOTE — Progress Notes (Addendum)
Subjective:   Hospital day: 2  Overnight event: No acute event  Patient is seen at bedside. She appears comfortable and in no acute distress. Patient states that her LLQ pain still persists but better. Still denies pain or burning sensation with urination. Also denies back pain or wounds on her skin. Denies bloody bowel moments.   Objective:  Vital signs in last 24 hours: Vitals:   03/16/20 0036 03/16/20 0336 03/16/20 0400 03/16/20 0402  BP: (!) 158/71 (!) 143/64 (!) 156/77   Pulse: 82 85 86   Resp: 14 16 (!) 31   Temp:    98.7 F (37.1 C)  TempSrc:    Oral  SpO2: 99% 99% 96%   Weight:      Height:       CBC Latest Ref Rng & Units 03/16/2020 03/15/2020 03/14/2020  WBC 4.0 - 10.5 K/uL 6.6 5.4 8.9  Hemoglobin 12.0 - 15.0 g/dL 9.5(L) 10.0(L) 11.0(L)  Hematocrit 36 - 46 % 30.6(L) 31.4(L) 34.9(L)  Platelets 150 - 400 K/uL 208 181 213   CMP Latest Ref Rng & Units 03/16/2020 03/15/2020 03/14/2020  Glucose 70 - 99 mg/dL 214(H) 366(H) -  BUN 6 - 20 mg/dL 21(H) 24(H) -  Creatinine 0.44 - 1.00 mg/dL 0.86 1.27(H) -  Sodium 135 - 145 mmol/L 136 134(L) -  Potassium 3.5 - 5.1 mmol/L 3.9 4.0 -  Chloride 98 - 111 mmol/L 101 99 -  CO2 22 - 32 mmol/L 23 21(L) -  Calcium 8.9 - 10.3 mg/dL 8.6(L) 8.5(L) -  Total Protein 6.5 - 8.1 g/dL - - 6.2(L)  Total Bilirubin 0.3 - 1.2 mg/dL - - 0.4  Alkaline Phos 38 - 126 U/L - - 107  AST 15 - 41 U/L - - 20  ALT 0 - 44 U/L - - 12     Physical Exam  Physical Exam Constitutional:      General: She is not in acute distress.    Comments: Tired appearance  HENT:     Head: Normocephalic.  Eyes:     General:        Right eye: No discharge.        Left eye: No discharge.  Cardiovascular:     Rate and Rhythm: Normal rate and regular rhythm.  Pulmonary:     Effort: No respiratory distress.     Comments: Mild crackles bilateral lung bases Abdominal:     General: Bowel sounds are normal.     Palpations: Abdomen is soft.     Tenderness: There  is no right CVA tenderness or left CVA tenderness.     Comments: Mild tenderness to palpation   Musculoskeletal:     Right lower leg: No edema.     Left lower leg: No edema.  Skin:    General: Skin is warm.     Comments: No sacral wound  Neurological:     Mental Status: She is alert.  Psychiatric:        Mood and Affect: Mood normal.     Assessment/Plan: Ms. 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 Patient developed fevers with no localized source, continue evaluation.   Principal Problem:   DKA (diabetic ketoacidosis) ([place]) Active Problems:   Type II diabetes mellitus ([place])   Hypertension associated with diabetes ([place])   (  HFpEF) heart failure with preserved ejection fraction (HCC)   Atrial fibrillation (HCC)   Acute kidney injury ([place])   Fever Left lower quadrant abdominal pain Unclear source of infection.  T-max of 100.5 in last 24h.  CT abdomen obtained yesterday shows left perinephric stranding however low suspicion for pyelonephritis or UTI given unremarkable UA with the absence of symptoms, also no CVA tenderness.  CRP and ESR were elevated.  Pro-Cal 1.52 but chest x-ray did not show any focal consolidation.  Pending left foot MRI to rule out osteomyelitis and echocardiogram to rule out endocarditis.  We will continue vancomycin and cefepime at this time due to positive MRSA PCR screen.  -Continue vancomycin and cefepime (day 2) -Follow blood culture. Negative up to date -Urine culture showed multiple species present -Pending left foot MRI and echocardiogram   Diabetic ketoacidosis-resolved History of poorly controlled type II diabetes mellitus. A1C > 14 CBG improved after  increase Lantus to 40 units. Fasting CBG this AM is 175 -Continue Lantus to 40U daily -continue resistant SSI -Monitor CBG   Acute kidney injury-resolved Baseline sCr ~0.9. Suspect pre-renal in setting of dehydration from vomiting and poor PO intake vs diabetic nephropathy. -Trend creatinine1.9-1.6-1.28-1.19-0.86 -Encourage PO intake  -Restart home Lasix and Lisinopril   Elevated beta-hCG Elevated beta hCG on i-STAT. Quantitative beta-hCG came back slightly elevated at 5. Likely related to post-menopausal sate. No adenexal masses appreciated on CT from 11/17. Low sus for trophoblastic disease or neoplasm.  -Follow up OB/GYN outpatient   Normocytic anemia Patient has a history of iron deficiency anemia requiring PO iron supplementation.   Hemoglobin continues to trend down 11-10-9.5.  Patient denies any bloody emesis.  We will continue to monitor - CBC in AM   History of falls Dizziness Patient and family report a history of poor balance and falls, ~5 in the last year. Likely multifactorial in the setting of partial blindness, orthostasis, possible diabetic peripheral neuropathy. Dysautonomia from poorly controlled diabetes could also be contributing. - PT/OT   HFpEF Hypertension Most recent echo in 08/2018 with EF 50-55%.  -Restart home Lasix and Lisinopril    Hyperlipidemia - resume atorvastatin 40 mg daily.   Atrial fibrillation -Restart home Eliquis - Currently on Lovenox for DVT ppx   Diet:Carb modified IVF:NA MKJ:IZXYOFV CODE:Full  Prior to Admission Living Arrangement:Home Anticipated Discharge Location:To be determined Barriers to Discharge:medical treatment Dispo: Anticipated discharge in approximately2-3day(s).   Gaylan Gerold, DO 03/16/2020, 5:59 AM Pager: [phone] After 5pm on weekdays and 1pm on weekends: On Call pager [phone]

## 2020-03-17 DIAGNOSIS — L03116 Cellulitis of left lower limb: Secondary | ICD-10-CM | POA: Diagnosis present

## 2020-03-17 LAB — BASIC METABOLIC PANEL
Anion gap: 12 (ref 5–15)
BUN: 14 mg/dL (ref 6–20)
CO2: 22 mmol/L (ref 22–32)
Calcium: 8.7 mg/dL — ABNORMAL LOW (ref 8.9–10.3)
Chloride: 103 mmol/L (ref 98–111)
Creatinine, Ser: 0.82 mg/dL (ref 0.44–1.00)
GFR, Estimated: >60 mL/min (ref >60)
Glucose, Bld: 132 mg/dL — ABNORMAL HIGH (ref 70–99)
Potassium: 3.6 mmol/L (ref 3.5–5.1)
Sodium: 137 mmol/L (ref 135–145)

## 2020-03-17 LAB — CBC
HCT: 28.1 % — ABNORMAL LOW (ref 36.0–46.0)
Hemoglobin: 8.8 g/dL — ABNORMAL LOW (ref 12.0–15.0)
MCH: 26.7 pg (ref 26.0–34.0)
MCHC: 31.3 g/dL (ref 30.0–36.0)
MCV: 85.4 fL (ref 80.0–100.0)
Platelets: 217 10*3/uL (ref 150–400)
RBC: 3.29 MIL/uL — ABNORMAL LOW (ref 3.87–5.11)
RDW: 12.5 % (ref 11.5–15.5)
WBC: 6.6 10*3/uL (ref 4.0–10.5)
nRBC: 0 % (ref 0.0–0.2)

## 2020-03-17 LAB — RETICULOCYTES
Immature Retic Fract: 12.4 % (ref 2.3–15.9)
RBC.: 3.4 MIL/uL — ABNORMAL LOW (ref 3.87–5.11)
Retic Count, Absolute: 34 10*3/uL (ref 19.0–186.0)
Retic Ct Pct: 1 % (ref 0.4–3.1)

## 2020-03-17 LAB — FERRITIN: Ferritin: 99 ng/mL (ref 11–307)

## 2020-03-17 LAB — FOLATE: Folate: 14.8 ng/mL (ref >5.9)

## 2020-03-17 LAB — IRON AND TIBC
Iron: 26 ug/dL — ABNORMAL LOW (ref 28–170)
Saturation Ratios: 13 % (ref 10.4–31.8)
TIBC: 206 ug/dL — ABNORMAL LOW (ref 250–450)
UIBC: 180 ug/dL

## 2020-03-17 LAB — VITAMIN B12: Vitamin B-12: 652 pg/mL (ref 180–914)

## 2020-03-17 LAB — GLUCOSE, CAPILLARY
Glucose-Capillary: 124 mg/dL — ABNORMAL HIGH (ref 70–99)
Glucose-Capillary: 132 mg/dL — ABNORMAL HIGH (ref 70–99)
Glucose-Capillary: 124 mg/dL — ABNORMAL HIGH (ref 70–99)
Glucose-Capillary: 62 mg/dL — ABNORMAL LOW (ref 70–99)
Glucose-Capillary: 114 mg/dL — ABNORMAL HIGH (ref 70–99)

## 2020-03-17 LAB — VANCOMYCIN, TROUGH: Vancomycin Tr: 33 ug/mL (ref 15–20)

## 2020-03-17 MED ORDER — CEFAZOLIN SODIUM-DEXTROSE 1-4 GM/50ML-% IV SOLN
1.0000 g | Freq: Three times a day (TID) | INTRAVENOUS | Status: DC
  Administered 2020-03-17 – 2020-03-18 (×3): 1 g via INTRAVENOUS
  Filled 2020-03-17 (×4): qty 50

## 2020-03-17 MED ORDER — FERROUS SULFATE 325 (65 FE) MG PO TABS
325.0000 mg | ORAL_TABLET | ORAL | Status: DC
  Administered 2020-03-17: 325 mg via ORAL
  Filled 2020-03-17 (×2): qty 1

## 2020-03-17 NOTE — Progress Notes (Signed)
Date and time results received: 03/17/20 & 0548  Test: Vanc [place] Critical Value: 33  Name of Provider Notified: IMTS Shon Baton)  Orders Received? Or Actions Taken?: MD stated to contact Pharmacy to notify them of results. RN notified pharmacy, which stated to hold 0600 vanc. Labs ordered by MD.

## 2020-03-17 NOTE — Progress Notes (Signed)
Subjective:   Hospital day: 3  Overnight event: No acute event  Patient is seen at bedside.  She appears comfortable.  Her last bowel movement was yesterday and states it was normal.  Denies abdominal pain or dysuria.  States that her left foot feels numb more swollen and heavy, but no pain associated.  States that she feels a lot better overall.  She appreciates the care from other physicians.  Objective:  Vital signs in last 24 hours: Vitals:   03/16/20 1210 03/16/20 1643 03/16/20 2115 03/17/20 0500  BP: (!) 147/71 (!) 153/78 (!) 141/72 (!) 150/71  Pulse: 80 76 84 91  Resp: 20 (!) 25 20 20   Temp: 98.6 F (37 C) 98.6 F (37 C) 98.4 F (36.9 C) 98.2 F (36.8 C)  TempSrc: Oral Oral Oral Oral  SpO2: 97% 95% 93% 94%  Weight:      Height:       CBC Latest Ref Rng & Units 03/17/2020 03/16/2020 03/15/2020  WBC 4.0 - 10.5 K/uL 6.6 6.6 5.4  Hemoglobin 12.0 - 15.0 g/dL 8.8(L) 9.5(L) 10.0(L)  Hematocrit 36 - 46 % 28.1(L) 30.6(L) 31.4(L)  Platelets 150 - 400 K/uL 217 208 181   BMP Latest Ref Rng & Units 03/17/2020 03/16/2020 03/15/2020  Glucose 70 - 99 mg/dL 132(H) 214(H) 366(H)  BUN 6 - 20 mg/dL 14 21(H) 24(H)  Creatinine 0.44 - 1.00 mg/dL 0.82 0.86 1.27(H)  BUN/Creat Ratio 9 - 23 - - -  Sodium 135 - 145 mmol/L 137 136 134(L)  Potassium 3.5 - 5.1 mmol/L 3.6 3.9 4.0  Chloride 98 - 111 mmol/L 103 101 99  CO2 22 - 32 mmol/L 22 23 21(L)  Calcium 8.9 - 10.3 mg/dL 8.7(L) 8.6(L) 8.5(L)     Physical Exam  Physical Exam Constitutional:      General: She is not in acute distress.    Appearance: She is obese.  HENT:     Head: Normocephalic.  Eyes:     General:        Right eye: No discharge.        Left eye: No discharge.  Cardiovascular:     Rate and Rhythm: Normal rate and regular rhythm.     Heart sounds: Normal heart sounds.  Pulmonary:     Effort: No respiratory distress.  Abdominal:     General: Bowel sounds are normal.     Palpations: Abdomen is soft.      Tenderness: There is no abdominal tenderness.  Musculoskeletal:     Cervical back: Normal range of motion.     Right lower leg: No edema.     Left lower leg: Edema present.     Comments: Trace edema of left foot, slightly warmer to touch compared to the right foot, no pain to palpation of the left second toe or foot.  Neurological:     Mental Status: She is alert.  Psychiatric:        Mood and Affect: Mood normal.     Assessment/Plan: Ms. 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 Patient developed fevers with no localized source, continue evaluation.   Principal Problem:   DKA (diabetic ketoacidosis) ([place]) Active Problems:   Type II diabetes mellitus ([place])   Hypertension associated with diabetes ([place])   (HFpEF) heart failure with preserved  ejection fraction (HCC)   Atrial fibrillation ([place])   Acute kidney injury ([place])  Fever Left foot cellulitis Patient was afebrile in the last 24 hours.  Left foot MRI shows nonpurulent cellulitis with myositis and negative for osteomyelitis or abscess.  Echocardiogram is negative for vegetations.  Still low suspicion for pyelonephritis given the lack of symptoms.  Will narrow the antibiotics from vancomycin/cefepime to cefazolin.  Continue to monitor fever trend, if continues to do well can transition to p.o. antibiotic.  However if patient starts to spike fever overnight, will restart vancomycin for MRSA coverage. -Start cefazolin 1 g every 8 hours (day 3 of Abx) -Followblood culture. Negative up to date -Urine culture showed multiple species present -Monitor fever trend   History of poorly controlled type II diabetes mellitus. A1C > 14 CBG improved after  increase Lantus to 40 units. Fasting CBG this AM is 124 -Continue Lantus to 40Udaily -continue resistant SSI -Monitor CBG   Normocytic anemia Patient has a history of iron deficiency anemia requiring PO iron supplementation.  Hemoglobin continues to trend down 11-10-9.5 - 8.8.    No sign of overt bleeding.  Iron study suggest mild iron deficiency.  Nile Riggs equation shows 1199 mg of total iron deficit.  we will start p.o. iron supplements.  Also will hold off on checking labs for a few days to prevent further blood loss from phlebotomy. -Ferrous sulfate 3.25 mg MWF -Hold off on checking labs   HFpEF Hypertension Afib Most recent echo in 08/2018 with EF 50-55%.  -Restart home Lasix and Lisinopril  -Resume Eliquis   Elevated beta-hCG Elevated beta hCG on i-STAT.Quantitative beta-hCG came back slightly elevated at 5.Likely related to post-menopausal sate.No adenexal masses appreciated on CT from 11/17.Low sus for trophoblastic disease or neoplasm.  -Follow up OB/GYN outpatient   History of falls Dizziness Patient and family report a history of poor balance and falls, ~5 in the last year. Likely multifactorial in the setting of partial blindness, orthostasis, possible diabetic peripheral neuropathy. Dysautonomia from poorly controlled diabetes could also be contributing. - PT/OT   Hyperlipidemia - resumeatorvastatin 40 mg daily.   Diet:Carb modified IVF:NA YTR:ZNBVAPO CODE:Full  Prior to Admission Living Arrangement:Home Anticipated Discharge Location:To be determined Barriers to Discharge:medical treatment Dispo: Anticipated discharge in approximately2-3day(s).  Gaylan Gerold, DO 03/17/2020, 5:56 AM Pager: [phone] After 5pm on weekdays and 1pm on weekends: On Call pager [phone]

## 2020-03-17 NOTE — Progress Notes (Signed)
Pharmacy Antibiotic Note  Jennifer [name] is a 49 y.o. female with N/V and fevers.   vanc tr this am high at 33 - dose held this am  Plan: Continue current cefepime Hold vanc Recheck lvl in am  Height: 5\' 6"  (167.6 cm) Weight: 91.6 kg (202 lb) IBW/kg (Calculated) : 59.3  Temp (24hrs), Avg:98.5 F (36.9 C), Min:98.2 F (36.8 C), Max:98.6 F (37 C)  Recent Labs  Lab 03/13/20 1647 03/14/20 0117 03/14/20 0337 03/14/20 0801 03/14/20 1127 03/14/20 1638 03/15/20 0247 03/15/20 0755 03/15/20 1121 03/16/20 0154 03/17/20 0421  WBC 9.2  --  8.9  --   --   --  5.4  --   --  6.6 6.6  CREATININE 1.94*   < > 1.93*   < > 1.28* 1.19*  --  1.27*  --  0.86 0.82  LATICACIDVEN  --   --   --   --   --   --   --  1.1 1.6  --   --   VANCOTROUGH  --   --   --   --   --   --   --   --   --   --  33*   < > = values in this interval not displayed.    Estimated Creatinine Clearance: 95.6 mL/min (by C-G formula based on SCr of 0.82 mg/dL).    No Known Allergies  Barth Kirks, PharmD, BCPS, BCCCP Clinical Pharmacist [phone]  Please check AMION for all [place] numbers  03/17/2020 10:21 AM

## 2020-03-18 DIAGNOSIS — L03116 Cellulitis of left lower limb: Secondary | ICD-10-CM | POA: Diagnosis not present

## 2020-03-18 DIAGNOSIS — E1110 Type 2 diabetes mellitus with ketoacidosis without coma: Secondary | ICD-10-CM | POA: Diagnosis not present

## 2020-03-18 DIAGNOSIS — I10 Essential (primary) hypertension: Secondary | ICD-10-CM | POA: Diagnosis not present

## 2020-03-18 DIAGNOSIS — I4891 Unspecified atrial fibrillation: Secondary | ICD-10-CM | POA: Diagnosis not present

## 2020-03-18 LAB — GLUCOSE, CAPILLARY
Glucose-Capillary: 72 mg/dL (ref 70–99)
Glucose-Capillary: 150 mg/dL — ABNORMAL HIGH (ref 70–99)

## 2020-03-18 LAB — VANCOMYCIN, RANDOM: Vancomycin Rm: 16

## 2020-03-18 MED ORDER — CEPHALEXIN 500 MG PO CAPS
500.0000 mg | ORAL_CAPSULE | Freq: Four times a day (QID) | ORAL | Status: DC
  Administered 2020-03-18 (×2): 500 mg via ORAL
  Filled 2020-03-18 (×2): qty 1

## 2020-03-18 MED ORDER — CEPHALEXIN 500 MG PO CAPS: 500.0000 mg | ORAL_CAPSULE | Freq: Four times a day (QID) | ORAL | 0 refills | Status: AC

## 2020-03-18 MED ORDER — FERROUS SULFATE 325 (65 FE) MG PO TABS: 325.0000 mg | ORAL_TABLET | ORAL | 0 refills | Status: DC

## 2020-03-18 MED ORDER — INSULIN NPH (HUMAN) (ISOPHANE) 100 UNIT/ML ~~LOC~~ SUSP
35.0000 [IU] | SUBCUTANEOUS | 11 refills | Status: DC
Start: 2020-03-18 — End: 2020-04-13

## 2020-03-18 NOTE — Progress Notes (Signed)
Subjective:   Hospital day: 4  Overnight event: No acute event  Patient is seen at bedside.  She stated that she is feeling well.  Her heaviness in the left foot also better.  No other complaints.  Patient inquires about discharge plan.  Objective:  Vital signs in last 24 hours: Vitals:   03/17/20 0802 03/17/20 1254 03/17/20 2109 03/18/20 0504  BP: (!) 180/83 123/71 (!) 146/73 128/66  Pulse:  81 77 75  Resp:  16 19 18   Temp:  98.4 F (36.9 C) 98.2 F (36.8 C) 98.5 F (36.9 C)  TempSrc:  Oral Oral Oral  SpO2:  96% 96% 97%  Weight:      Height:       CBC Latest Ref Rng & Units 03/17/2020 03/16/2020 03/15/2020  WBC 4.0 - 10.5 K/uL 6.6 6.6 5.4  Hemoglobin 12.0 - 15.0 g/dL 8.8(L) 9.5(L) 10.0(L)  Hematocrit 36 - 46 % 28.1(L) 30.6(L) 31.4(L)  Platelets 150 - 400 K/uL 217 208 181   CMP Latest Ref Rng & Units 03/17/2020 03/16/2020 03/15/2020  Glucose 70 - 99 mg/dL 132(H) 214(H) 366(H)  BUN 6 - 20 mg/dL 14 21(H) 24(H)  Creatinine 0.44 - 1.00 mg/dL 0.82 0.86 1.27(H)  Sodium 135 - 145 mmol/L 137 136 134(L)  Potassium 3.5 - 5.1 mmol/L 3.6 3.9 4.0  Chloride 98 - 111 mmol/L 103 101 99  CO2 22 - 32 mmol/L 22 23 21(L)  Calcium 8.9 - 10.3 mg/dL 8.7(L) 8.6(L) 8.5(L)  Total Protein 6.5 - 8.1 g/dL - - -  Total Bilirubin 0.3 - 1.2 mg/dL - - -  Alkaline Phos 38 - 126 U/L - - -  AST 15 - 41 U/L - - -  ALT 0 - 44 U/L - - -      Physical Exam  Physical Exam Constitutional:      General: She is not in acute distress.    Appearance: She is obese.  HENT:     Head: Normocephalic.  Cardiovascular:     Rate and Rhythm: Normal rate and regular rhythm.  Pulmonary:     Effort: No respiratory distress.     Breath sounds: Normal breath sounds.  Abdominal:     General: Bowel sounds are normal.     Palpations: Abdomen is soft.     Tenderness: There is no abdominal tenderness.  Musculoskeletal:     Right lower leg: No edema.     Left lower leg: No edema.     Comments: Left foot  nonerythematous, nonedematous  Neurological:     Mental Status: She is alert.  Psychiatric:        Mood and Affect: Mood normal.     Assessment/Plan: 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 Now treating for cellulitis of left foot.   Principal Problem:   DKA (diabetic ketoacidosis) ([place]) Active Problems:   Type II diabetes mellitus ([place])   Hypertension associated with diabetes ([place])   (HFpEF) heart failure with preserved ejection fraction (H[facility])   Hematuria   Atrial fibrillation ([place])   Acute kidney injury ([place])   Cellulitis of left foot  Fever Left foot cellulitis Patient was afebrile in the last 24 hours.  Will deescalate from IV cefazolin to Keflex to complete a 7 days course.  Plan to discharge home today. -Keflex 500  mg Q6h (day 4 of Abx). Total of 7 days -Followblood culture. Negative up to date -Urine cultureshowed multiple species present -Monitor fever trend   History of poorly controlled type II diabetes mellitus. A1C > 14 Patient requires 9 units of Novolog, would increase Lantus however she had an episode of hypoglycemia at 9PM.  -ContinueLantus to 40Udaily.  -continue resistant SSI -Monitor CBG -Patient will be discharged home with reduced dose of home insulin due to hypoglycemic events.  She will follow up with the [facility] on December 6 to readjust her diabetic regimen.   Normocytic anemia Hematuria Hematemesis? Patient has a history of iron deficiency anemia requiring PO iron supplementation.Hemoglobin continues to trend down 11-10-9.5 - 8.8.   No sign of overt bleeding.  Iron study suggest mild iron deficiency.  Nile Riggs equation shows 1199 mg of total iron  deficit.  we will start p.o. iron supplements.  Also will hold off on checking labs for a few days to prevent further blood loss from phlebotomy. -Ferrous sulfate 3.25 mg MWF -Hold off on checking labs   HFpEF Hypertension Afib Most recent echo in 08/2018 with EF 50-55%. -Restart home Lasix and Lisinopril -Resume Eliquis   Elevated beta-hCG Elevated beta hCG on i-STAT.Quantitative beta-hCG came back slightly elevated at 5.Likely related to post-menopausal sate.No adenexal masses appreciated on CT from 11/17.Low sus for trophoblastic disease or neoplasm.  -Follow up OB/GYN outpatient   History of falls Dizziness Patient and family report a history of poor balance and falls, ~5 in the last year. Likely multifactorial in the setting of partial blindness, orthostasis, possible diabetic peripheral neuropathy. Dysautonomia from poorly controlled diabetes could also be contributing. - PT/OT today. Pending discharge    Hyperlipidemia - resumeatorvastatin 40 mg daily.   Diet:Carb modified IVF:NA XJD:BZMCEYE CODE:Full  Prior to Admission Living Arrangement:Home Anticipated Discharge Location:To be determined Barriers to Discharge:medical treatment Dispo: Anticipated discharge today?  Gaylan Gerold, DO 03/18/2020, 6:01 AM Pager: [phone] After 5pm on weekdays and 1pm on weekends: On Call pager [phone]

## 2020-03-18 NOTE — Progress Notes (Signed)
Patient wheeled to discharge area via wheelchair by Nurse Tech. No distress noted.

## 2020-03-18 NOTE — Evaluation (Signed)
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   Clinical Impression   Patient admitted with the above diagnosis.  States she is feeling much better and is looking forward to returning home. She presents with unsteadiness on her feet.  Would benefit from [facility] nursing for diabetes teaching, RW and HH PT. Daughter states she is fairly sedentary at home.  No further needs in the acute setting.  OT will sign off.      Follow Up Recommendations  Home health OT , [facility] Nursing for diabetes teaching.     Equipment Recommendations  Tub/shower seat    Recommendations for Other Services  PT Eval     Precautions / Restrictions Precautions Precautions: Fall Restrictions Weight Bearing Restrictions: No      Mobility Bed Mobility Overal bed mobility: Independent                  Transfers Overall transfer level: Needs assistance Equipment used: Rolling walker (2 wheeled) Transfers: Sit to/from [facility] Sit to Stand: Supervision Stand pivot transfers: Supervision            Balance Overall balance assessment: Mild deficits observed, not formally tested                                         ADL either performed or assessed with clinical judgement   ADL Overall ADL's : Needs assistance/impaired Eating/Feeding: Independent   Grooming: Wash/dry hands;Wash/dry face;Oral care;Supervision/safety;Standing   Upper Body Bathing: Set up;Sitting   Lower Body Bathing: Supervison/ safety;Sit to/from stand   Upper Body Dressing :  Set up;Sitting   Lower Body Dressing: Supervision/safety;Sit to/from stand   Toilet Transfer: Supervision/safety;RW           Functional mobility during ADLs: Supervision/safety;Rolling walker       Vision Patient Visual Report: No change from baseline       Perception     Praxis      Pertinent Vitals/Pain Pain Assessment: No/denies pain     Hand Dominance Right   Extremity/Trunk Assessment Upper Extremity Assessment Upper Extremity Assessment: Generalized weakness;LUE deficits/detail LUE Deficits / Details: ? RCT with decreased end range to shoulder forward flexion LUE Sensation: WNL LUE Coordination: WNL   Lower Extremity Assessment Lower Extremity Assessment: Defer to PT evaluation   Cervical / Trunk Assessment Cervical / Trunk Assessment: Normal   Communication Communication Communication: Prefers language other than English   Cognition Arousal/Alertness: Awake/alert Behavior During Therapy: WFL for tasks assessed/performed Overall Cognitive Status: Within Functional Limits for tasks assessed                                     General Comments   BP at conclusion 155/86    Exercises     Shoulder Instructions      Home Living Family/patient expects to be discharged to:: Private residence Living Arrangements: Children Available Help at Discharge: Family;Available PRN/intermittently Type of Home: Apartment Home Access: Level entry     Home  Layout: One level     Bathroom Shower/Tub: Advertising copywriter: Yes How Accessible: Accessible via walker Home Equipment: Bond - single point          Prior Functioning/Environment Level of Independence: Independent                 OT Problem List: Impaired balance (sitting and/or standing)      OT Treatment/Interventions:      OT Goals(Current goals can be found in the care plan section) Acute Rehab OT Goals Patient Stated  Goal: Thankful for care and looking forward to going home OT Goal Formulation: With patient Time For Goal Achievement: 03/18/20 Potential to Achieve Goals: Good  OT Frequency:     Barriers to D/C:  none noted          Co-evaluation              AM-PAC OT "6 Clicks" Daily Activity     Outcome Measure Help from another person eating meals?: None Help from another person taking care of personal grooming?: None Help from another person toileting, which includes using toliet, bedpan, or urinal?: None Help from another person bathing (including washing, rinsing, drying)?: A Little Help from another person to put on and taking off regular upper body clothing?: None Help from another person to put on and taking off regular lower body clothing?: A Little 6 Click Score: 22   End of Session Equipment Utilized During Treatment: Rolling walker Nurse Communication: Mobility status  Activity Tolerance: Patient tolerated treatment well Patient left: in bed;with call bell/phone within reach;with family/visitor present  OT Visit Diagnosis: Unsteadiness on feet (R26.81)                Time: 1610-9604 OT Time Calculation (min): 19 min Charges:  OT General Charges $OT Visit: 1 Visit OT Evaluation $OT Eval Moderate Complexity: 1 Mod  03/18/2020  Rich, OTR/L  Acute Rehabilitation Services  Office:  [phone]   Metta Clines 03/18/2020, 9:01 AM

## 2020-03-18 NOTE — Evaluation (Signed)
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  Clinical Impression  Pt is close to baseline functioning, but is significantly weak, in need of follow up PT at home.  She should be safe at home with family assist, but could be so much better with effective encouragement to mobilize. There are no further acute PT needs.  Will sign off at this time.     Follow Up Recommendations Home health PT;Supervision - Intermittent;Supervision/Assistance - 24 hour    Equipment Recommendations  Rolling walker with 5" wheels;3in1 (PT)    Recommendations for Other Services       Precautions / Restrictions Precautions Precautions: Fall      Mobility  Bed Mobility Overal bed mobility: Independent                  Transfers Overall transfer level: Needs assistance Equipment used: None (vs occasional rail) Transfers: Sit to/from Stand Sit to Stand: Supervision Stand pivot transfers: Supervision          Ambulation/Gait Ambulation/Gait assistance: Supervision;Min guard Gait Distance (Feet): 300 Feet Assistive device: None (rail) Gait Pattern/deviations: Step-through pattern;Scissoring;Decreased step length - right;Decreased step length - left;Decreased stride length Gait velocity: slower Gait velocity interpretation: <1.8 ft/sec, indicate of risk for recurrent falls General Gait Details: pt is mildly unsteady overall.  She drifts and scissors occasionally with scanning and directional changes.  Worsens with fatigue.  She was asked to walk much further than her normal  distances around her apt.  Stairs Stairs: Yes Stairs assistance: Min assist Stair Management: Step to pattern;One rail Right;Forwards;Sideways Number of Stairs: 2 General stair comments: pt had difficult boosting up and controlling descent without minimal assist.  Wheelchair Mobility    Modified Rankin (Stroke Patients Only)       Balance Overall balance assessment: Mild deficits observed, not formally tested;Needs assistance Sitting-balance support: No upper extremity supported Sitting balance-Leahy Scale: Good     Standing balance support: No upper extremity supported Standing balance-Leahy Scale: Fair Standing balance comment: dynamic balance poses greater balance challenges to pt mostly due to significant weakness.                             Pertinent Vitals/Pain Pain Assessment: No/denies pain    Home Living Family/patient expects to be discharged to:: Private residence Living Arrangements: Children Available Help at Discharge: Family;Available PRN/intermittently Type of Home: Apartment Home Access: Level entry     Home Layout: One level Home Equipment: Cane - single point      Prior Function Level of Independence: Independent               Hand Dominance   Dominant Hand: Right    Extremity/Trunk Assessment   Upper Extremity Assessment Upper Extremity Assessment: Generalized weakness LUE Deficits / Details: ? RCT with decreased end range to shoulder forward flexion LUE Sensation: WNL LUE Coordination: WNL    Lower Extremity Assessment Lower Extremity Assessment: Generalized weakness (especially weak hip flexors bil)    Cervical / Trunk Assessment Cervical / Trunk Assessment: Normal  Communication   Communication: Prefers language other than English  Cognition Arousal/Alertness: Awake/alert  Behavior During Therapy: WFL for tasks assessed/performed Overall Cognitive Status: Within Functional Limits for tasks assessed                                         General Comments      Exercises     Assessment/Plan    PT Assessment All further PT needs can be met in the next venue of care  PT Problem List Decreased strength;Decreased activity tolerance;Decreased balance;Decreased mobility;Decreased knowledge of use of DME       PT Treatment Interventions      PT Goals (Current goals can be found in the Care Plan section)  Acute Rehab PT Goals Patient Stated Goal: Thankful for care and looking forward to going home PT Goal Formulation: All assessment and education complete, DC therapy    Frequency     Barriers to discharge        Co-evaluation               AM-PAC PT "6 Clicks" Mobility  Outcome Measure Help needed turning from your back to your side while in a flat bed without using bedrails?: None Help needed moving from lying on your back to sitting on the side of a flat bed without using bedrails?: None Help needed moving to and from a bed to a chair (including a wheelchair)?: None Help needed standing up from a chair using your arms (e.g., wheelchair or bedside chair)?: A Little Help needed to walk in hospital room?: A Little Help needed climbing 3-5 steps with a railing? : A Little 6 Click Score: 21    End of Session   Activity Tolerance: Patient tolerated treatment well;Patient limited by fatigue Patient left: in bed;with call bell/phone within reach;with family/visitor present Nurse Communication: Mobility status PT Visit Diagnosis: Unsteadiness on feet (R26.81);Muscle weakness (generalized) (M62.81)    Time: 6333-5456 PT Time Calculation (min) (ACUTE ONLY): 34 min   Charges:   PT Evaluation $PT Eval Moderate Complexity: 1 Mod PT Treatments $Gait Training: 8-22 mins        03/18/2020  Jennifer Carne., PT Acute Rehabilitation Services [phone]  (pager) [phone]  (office)  Jennifer [name] Jennifer [name] 03/18/2020, 12:21 PM

## 2020-03-18 NOTE — Discharge Summary (Addendum)
Name: Jennifer [name] MRN: [id number] DOB: [birth year] 49 y.o. PCP: Jose Persia, MD  Date of Admission: 03/13/2020  4:36 PM Date of Discharge:  03/18/2020 Attending Physician: Oda Kilts, MD  Discharge Diagnosis: 1.  Diabetic ketoacidosis 2.  Left foot cellulitis 3.  Uncontrolled diabetes 4.  Hypertension 5.  Atrial fibrillation  Discharge Medications: Allergies as of 03/18/2020   No Known Allergies      Medication List     STOP taking these medications    brimonidine 0.2 % ophthalmic solution Commonly known as: ALPHAGAN   ondansetron 4 MG tablet Commonly known as: Zofran       TAKE these medications    Accu-Chek FastClix Lancets Misc Check blood sugar 4 times a day   Accu-Chek Guide test strip Generic drug: glucose blood Check blood sugar 4 times per day   Accu-Chek Guide w/Device Kit 1 each by Does not apply route 4 (four) times daily.   acetaminophen 500 MG tablet Commonly known as: TYLENOL Take 500 mg by mouth every 6 (six) hours as needed for mild pain.   apixaban 5 MG Tabs tablet Commonly known as: ELIQUIS Take 1 tablet (5 mg total) by mouth 2 (two) times daily.   atorvastatin 40 MG tablet Commonly known as: Lipitor Take 1 tablet (40 mg total) by mouth daily.   cephALEXin 500 MG capsule Commonly known as: KEFLEX Take 1 capsule (500 mg total) by mouth every 6 (six) hours for 4 days.   dorzolamide-timolol 22.3-6.8 MG/ML ophthalmic solution Commonly known as: COSOPT Place 1 drop into the left eye 2 (two) times daily.   DULoxetine 60 MG capsule Commonly known as: CYMBALTA Take 60 mg by mouth daily.   ferrous sulfate 325 (65 FE) MG tablet Take 1 tablet (325 mg total) by mouth every Monday, Wednesday, and Friday for 14 days. Start taking on: March 20, 2020   furosemide 20 MG tablet Commonly known as: LASIX Take 1 tablet (20 mg total) by mouth daily as needed (leg swelling).   insulin NPH Human 100 UNIT/ML  injection Commonly known as: NOVOLIN N Inject 0.35-0.45 mLs (35-45 Units total) into the skin See admin instructions. 30 units in the morning, 20 units in the evening What changed: additional instructions   Insulin Pen Needle 31G X 5 MM Misc 1 Dose by Does not apply route 2 (two) times daily.   ketorolac 0.5 % ophthalmic solution Commonly known as: ACULAR Place 1 drop into the left eye 4 (four) times daily.   lisinopril 10 MG tablet Commonly known as: ZESTRIL Take 1 tablet (10 mg total) by mouth daily.   metFORMIN 1000 MG tablet Commonly known as: GLUCOPHAGE Take 1 tablet (1,000 mg total) by mouth 2 (two) times daily with a meal.   multivitamin with minerals tablet Take 1 tablet by mouth daily.   Trulicity 5.36 RW/4.3XV Sopn Generic drug: Dulaglutide Inject 0.75 mg into the skin once a week.   zinc gluconate 50 MG tablet Take 50 mg by mouth daily.        Disposition and follow-up:   [name] was discharged from [facility] in Stable condition.  At the hospital follow up visit please address:  1.   Primary care physician -Patient will need a better diabetic regimen.  I have reduced the dose of her NPH insulin to 30 units in a.m. and 20 and is in p.m. due to reported hypoglycemic events. -Please ensure completion of 7 days of antibiotic course for for cellulitis -  I also started her on iron supplement for IDA.  Please recheck CBC and work up for occult blood loss   2.  Labs / imaging needed at time of follow-up: BMP, CBC, CRP  3.  Pending labs/ test needing follow-up: NA  Follow-up Appointments:  Follow-up Information     Jose Persia, MD Follow up on 03/27/2020.   Specialty: Internal Medicine Contact information: [address] [place] [address] [place] [facility] Course by problem list: 1. Diabetic ketoacidosis Patient presented to the ED for 5 days of nausea, vomiting and dizziness.   She also had a fall for which she was evaluated in the ED with normal imaging findings.  Because of feeling sick, patient has not taken any medications including insulin in the last few days.  In the ED, she was found to be in DKA with blood glucose of 412, ketonuria, BHB 7.8, pH of 7.24 with elevated anion gap.  She was started on IV insulin drip with aggressive fluid resuscitation.  Patient came out of DKA later that day and transitioned to subcu insulin.  Her sugar was well controlled with 40 units of Lantus daily plus sliding scale short-acting insulin (47 - 51 units total daily dose last 2 days).  She reported hypoglycemic episodes at home, so her dose of NPH insulin was reduced, but she will need close follow up for continued titration as her A1C continues to be quite elevated.  Fever Left foot cellulitis and myositis Patient developed fevers on day 2.  CRP 35, procalcitonin 1.5. No clear localizing symptoms other than abdominal pain, nausea and vomiting, which had improved since admission. HIV negative at admission. LFTs normal, CT abdomen showed mild perinephric stranding of the left kidney but low suspicion for pyelonephritis given the lack of dysuria.  Chest x-ray showed mild pulmonary congestion with no focal infiltrate.  Echocardiogram was normal and negative for vegetation, blood cultures remained negative.  She did have a corn on the top of her left 2nd toe--MRI of the left foot was negative for osteomyelitis, however suggestive of cellulitis with myositis.  She was started on vancomycin and cefepime, then transitioned to IV cefazolin and did well with no further fevers.  She will be discharged with cephalexin to finish a 7-day course of antibiotic. She should have her inflammatory markers rechecked to ensure normalization.   Normocytic anemia Hematemesis Patient's daughter reports an episode of bloody emesis at home.  Patient has not had any more episodes of bloody emesis on this admission.   Also denies bloody bowel movements.  Hemoglobin trending down slowly from 11-10-9.5 - 8.8.  There was no overt bleeding episodes during this admission.  Iron study suggest mild iron deficiency with ferritin 99, iron 26, Tsat 13%.  Ganzoni equation shows 1100 mg of total iron deficit.  She was started on ferrous sulfate 325 mg MWF. She should have outpatient evaluation for the etiology of her iron deficiency.  Elevated beta-hCG Elevated beta hCG on i-STAT.  Quantitative beta-hCG came high normal at 5. Likely related to post-menopausal state. No adenexal or uterine masses noted on CT from 11/17.    Discharge Vitals:   BP 139/70 (BP Location: Left Arm)   Pulse 81   Temp 98.5 F (36.9 C) (Oral)   Resp 20   Ht $R'5\' 6"'VQ$  (1.676 m)   Wt 91.6 kg   LMP 08/21/2016 (Exact Date)  SpO2 97%   BMI 32.60 kg/m   Pertinent Labs, Studies, and Procedures:  CBC Latest Ref Rng & Units 03/17/2020 03/16/2020 03/15/2020  WBC 4.0 - 10.5 K/uL 6.6 6.6 5.4  Hemoglobin 12.0 - 15.0 g/dL 8.8(L) 9.5(L) 10.0(L)  Hematocrit 36 - 46 % 28.1(L) 30.6(L) 31.4(L)  Platelets 150 - 400 K/uL 217 208 181   CMP Latest Ref Rng & Units 03/17/2020 03/16/2020 03/15/2020  Glucose 70 - 99 mg/dL 132(H) 214(H) 366(H)  BUN 6 - 20 mg/dL 14 21(H) 24(H)  Creatinine 0.44 - 1.00 mg/dL 0.82 0.86 1.27(H)  Sodium 135 - 145 mmol/L 137 136 134(L)  Potassium 3.5 - 5.1 mmol/L 3.6 3.9 4.0  Chloride 98 - 111 mmol/L 103 101 99  CO2 22 - 32 mmol/L 22 23 21(L)  Calcium 8.9 - 10.3 mg/dL 8.7(L) 8.6(L) 8.5(L)  Total Protein 6.5 - 8.1 g/dL - - -  Total Bilirubin 0.3 - 1.2 mg/dL - - -  Alkaline Phos 38 - 126 U/L - - -  AST 15 - 41 U/L - - -  ALT 0 - 44 U/L - - -    DG Foot Complete Left  Result Date: 03/14/2020 CLINICAL DATA:  Fall EXAM: LEFT FOOT - COMPLETE 3+ VIEW COMPARISON:  None. FINDINGS: There is no evidence of fracture or dislocation. There is no evidence of arthropathy or other focal bone abnormality. Soft tissues are unremarkable.  IMPRESSION: Negative. Electronically Signed   By: Ulyses Jarred M.D.   On: 03/14/2020 03:12   CT ABDOMEN PELVIS WO CONTRAST  Result Date: 03/15/2020 CLINICAL DATA:  Abdominal pain, fever EXAM: CT ABDOMEN AND PELVIS WITHOUT CONTRAST TECHNIQUE: Multidetector CT imaging of the abdomen and pelvis was performed following the standard protocol without IV contrast. COMPARISON:  03/08/2020 FINDINGS: Lower chest: Lung bases are clear. Hepatobiliary: Unenhanced liver is unremarkable. Gallbladder is unremarkable. No intrahepatic or extrahepatic ductal dilatation. Pancreas: Within normal limits. Spleen: Within normal limits. Adrenals/Urinary Tract: Adrenal glands are within normal limits. Kidneys are grossly unremarkable, although mild left perinephric edema/stranding is suspected. Mildly thick-walled bladder. Stomach/Bowel: Stomach is within normal limits. No evidence of bowel obstruction. Normal appendix (series 3/image 87). No colonic wall thickening or inflammatory changes. Vascular/Lymphatic: No evidence of abdominal aortic aneurysm. No suspicious abdominopelvic lymphadenopathy. Reproductive: Uterus is within normal limits. Bilateral ovaries are within normal limits. Other: No abdominopelvic ascites. Musculoskeletal: Visualized osseous structures are within normal limits. IMPRESSION: Mildly thick-walled bladder. Correlate for cystitis. Mild left perinephric edema/stranding is equivocal but at least raises the possibility of pyelonephritis. Electronically Signed   By: Julian Hy M.D.   On: 03/15/2020 12:35   DG Chest 2 View  Result Date: 03/15/2020 CLINICAL DATA:  Fever.  Weakness.  Cough. EXAM: CHEST - 2 VIEW COMPARISON:  One-view chest x-ray 03/08/2020 FINDINGS: Heart is enlarged. This is further exaggerated by low lung volumes. Mild pulmonary vascular congestion is present. No significant airspace consolidation is present. Visualized soft tissues and bony thorax are unremarkable. IMPRESSION: 1.  Cardiomegaly and mild pulmonary vascular congestion. 2. No significant airspace consolidation. Electronically Signed   By: San Morelle M.D.   On: 03/15/2020 07:23   MR FOOT LEFT W WO CONTRAST  Result Date: 03/16/2020 CLINICAL DATA:  Foot pain and swelling.  Wound on the second toe. EXAM: MRI OF THE LEFT FOREFOOT WITHOUT AND WITH CONTRAST TECHNIQUE: Multiplanar, multisequence MR imaging of the left foot was performed both before and after administration of intravenous contrast. CONTRAST:  9.29mL GADAVIST GADOBUTROL 1 MMOL/ML IV SOLN COMPARISON:  Radiographs 03/14/2020 FINDINGS: Mild diffuse subcutaneous soft tissue swelling/edema involving the forefoot and midfoot and likely representing cellulitis. No discrete rim enhancing fluid collection to suggest an abscess. Diffuse myositis without findings for pyomyositis. No findings suspicious for septic arthritis or osteomyelitis. IMPRESSION: Cellulitis and myositis but no discrete drainable soft tissue abscess, pyomyositis, septic arthritis or osteomyelitis. Electronically Signed   By: Marijo Sanes M.D.   On: 03/16/2020 09:58   ECHOCARDIOGRAM COMPLETE  Result Date: 03/16/2020    ECHOCARDIOGRAM REPORT   Patient Name:   LANIQUA TORRENS Date of Exam: 03/16/2020 Medical Rec #:  [id number]      Height:       66.0 in Accession #:    [id number]     Weight:       202.0 lb Date of Birth:  04-02-1971       BSA:          2.008 m Patient Age:    69 years       BP:           155/90 mmHg Patient Gender: F              HR:           85 bpm. Exam Location:  Inpatient Procedure: 2D Echo, Cardiac Doppler and Color Doppler Indications:    Fever 780.6/ R50.9  History:        Patient has prior history of Echocardiogram examinations, most                 recent 09/20/2018.  Sonographer:    Merrie Roof RDCS Referring Phys: [id number] Raynaldo Opitz RAINES IMPRESSIONS  1. Left ventricular ejection fraction, by estimation, is 60 to 65%. The left ventricle has normal function. The left  ventricle has no regional wall motion abnormalities. Left ventricular diastolic parameters were normal.  2. Right ventricular systolic function is normal. The right ventricular size is normal. There is normal pulmonary artery systolic pressure. The estimated right ventricular systolic pressure is 32.9 mmHg.  3. The mitral valve is normal in structure. Mild mitral valve regurgitation.  4. The aortic valve is tricuspid. Aortic valve regurgitation is not visualized. No aortic stenosis is present.  5. The inferior vena cava is dilated in size with >50% respiratory variability, suggesting right atrial pressure of 8 mmHg. FINDINGS  Left Ventricle: Left ventricular ejection fraction, by estimation, is 60 to 65%. The left ventricle has normal function. The left ventricle has no regional wall motion abnormalities. The left ventricular internal cavity size was normal in size. There is  no left ventricular hypertrophy. Left ventricular diastolic parameters were normal. Right Ventricle: The right ventricular size is normal. No increase in right ventricular wall thickness. Right ventricular systolic function is normal. There is normal pulmonary artery systolic pressure. The tricuspid regurgitant velocity is 2.55 m/s, and  with an assumed right atrial pressure of 8 mmHg, the estimated right ventricular systolic pressure is 51.8 mmHg. Left Atrium: Left atrial size was normal in size. Right Atrium: Right atrial size was normal in size. Pericardium: Trivial pericardial effusion is present. Mitral Valve: The mitral valve is normal in structure. Mild mitral valve regurgitation. Tricuspid Valve: The tricuspid valve is normal in structure. Tricuspid valve regurgitation is mild. Aortic Valve: The aortic valve is tricuspid. Aortic valve regurgitation is not visualized. No aortic stenosis is present. Pulmonic Valve: The pulmonic valve was not well visualized. Pulmonic valve regurgitation is not visualized. Aorta: The aortic root and  ascending aorta are structurally normal,  with no evidence of dilitation. Venous: The inferior vena cava is dilated in size with greater than 50% respiratory variability, suggesting right atrial pressure of 8 mmHg. IAS/Shunts: The interatrial septum was not well visualized.  LEFT VENTRICLE PLAX 2D LVIDd:         4.50 cm     Diastology LVIDs:         3.10 cm     LV e' medial:    8.16 cm/s LV PW:         1.00 cm     LV E/e' medial:  12.4 LV IVS:        0.80 cm     LV e' lateral:   8.49 cm/s                            LV E/e' lateral: 11.9  LV Volumes (MOD) LV vol d, MOD A4C: 83.1 ml LV vol s, MOD A4C: 29.5 ml LV SV MOD A4C:     83.1 ml RIGHT VENTRICLE          IVC RV Basal diam:  2.90 cm  IVC diam: 2.30 cm LEFT ATRIUM             Index       RIGHT ATRIUM           Index LA diam:        4.20 cm 2.09 cm/m  RA Area:     11.80 cm LA Vol (A2C):   68.9 ml 34.31 ml/m RA Volume:   24.00 ml  11.95 ml/m LA Vol (A4C):   58.1 ml 28.93 ml/m LA Biplane Vol: 64.4 ml 32.07 ml/m  AORTIC VALVE LVOT Vmax:   97.30 cm/s LVOT Vmean:  66.900 cm/s LVOT VTI:    0.207 m  AORTA Ao Root diam: 2.60 cm Ao Asc diam:  3.20 cm MITRAL VALVE                TRICUSPID VALVE MV Area (PHT): 4.54 cm     TR Peak grad:   26.0 mmHg MV Decel Time: 167 msec     TR Vmax:        255.00 cm/s MV E velocity: 101.00 cm/s MV A velocity: 114.00 cm/s  SHUNTS MV E/A ratio:  0.89         Systemic VTI: 0.21 m Epifanio Lesches MD Electronically signed by Epifanio Lesches MD Signature Date/Time: 03/16/2020/1:31:50 PM    Final     Discharge Instructions: Discharge Instructions     Call MD for:  persistant nausea and vomiting   Complete by: As directed    Call MD for:  severe uncontrolled pain   Complete by: As directed    Diet - low sodium heart healthy   Complete by: As directed    Discharge instructions   Complete by: As directed    Ms. Gardiner, it is a pleasure taking care of you during that admission.  You were hospitalized for diabetic  ketoacidosis.  -Please reduce the dose of your insulin to 30 units in the morning and 20 units in the evening.  Please follow-up with your primary care doctor as scheduled -Please start taking your antibiotic for the skin infection of the left foot to finish a 7-day course.  Take care   Increase activity slowly   Complete by: As directed        Signed: Doran Stabler, DO 03/18/2020, 1:56 PM   Pager:  [phone] 

## 2020-03-18 NOTE — Progress Notes (Signed)
Completed discharge teaching with mother and daughter. Verbalized understanding of medications as well as follow up appointments. PIV removed.  Daughter will go to get the car after mother finishes her dinner.

## 2020-03-18 NOTE — TOC Transition Note (Signed)
Transition of Care [facility]) - CM/SW Discharge Note   Patient Details  Name: Jennifer [name] MRN: [id number] Date of Birth: [birth year]  Transition of Care [facility]) CM/SW Contact:  Bartholomew Crews, RN Phone Number: [phone] 03/18/2020, 3:25 PM   Clinical Narrative:     Notified of patient transition home today. HH orders for RN (diabetic teaching), PT, OT - Referral accepted by [facility]. DME orders for RW, 3N1 - Referral sent to [facility] to deliver to room. Spoke with daughter and patient at the bedside to discuss transition plans - daughter interpreted for patient. Spoke with daughter on room phone shortly after face to face conversation to confirm [facility] agency. No further TOC needs identified.   Final next level of care: [place] Barriers to Discharge: No Barriers Identified   Patient Goals and CMS Choice Patient states their goals for this hospitalization and ongoing recovery are:: home with family support CMS Medicare.gov Compare Post Acute Care list provided to:: Patient Choice offered to / list presented to : Adult Children, Patient  Discharge Placement                       Discharge Plan and Services                DME Arranged: 3-N-1, Walker rolling DME Agency: [facility] Date DME Agency Contacted: 03/18/20 Time DME Agency Contacted: [phone] Representative spoke with at DME Agency: Jodell Cipro HH Arranged: RN, PT, OT [facility] Agency: [facility]        Social Determinants of Health (SDOH) Interventions     Readmission Risk Interventions No flowsheet data found.

## 2020-03-18 NOTE — Discharge Instructions (Signed)
Hyperglycemia Hyperglycemia is when the sugar (glucose) level in your blood is too high. It may not cause symptoms. If you do have symptoms, they may include warning signs, such as:  Feeling more thirsty than normal.  Hunger.  Feeling tired.  Needing to pee (urinate) more than normal.  Blurry eyesight (vision). You may get other symptoms as it gets worse, such as:  Dry mouth.  Not being hungry (loss of appetite).  Fruity-smelling breath.  Weakness.  Weight gain or loss that is not planned. Weight loss may be fast.  A tingling or numb feeling in your hands or feet.  Headache.  Skin that does not bounce back quickly when it is lightly pinched and released (poor skin turgor).  Pain in your belly (abdomen).  Cuts or bruises that heal slowly. High blood sugar can happen to people who do or do not have diabetes. High blood sugar can happen slowly or quickly, and it can be an emergency. Follow these instructions at home: General instructions  Take over-the-counter and prescription medicines only as told by your doctor.  Do not use products that contain nicotine or tobacco, such as cigarettes and e-cigarettes. If you need help quitting, ask your doctor.  Limit alcohol intake to no more than 1 drink per day for nonpregnant women and 2 drinks per day for men. One drink equals 12 oz of beer, 5 oz of wine, or 1 oz of hard liquor.  Manage stress. If you need help with this, ask your doctor.  Keep all follow-up visits as told by your doctor. This is important. Eating and drinking   Stay at a healthy weight.  Exercise regularly, as told by your doctor.  Drink enough fluid, especially when you: ? Exercise. ? Get sick. ? Are in hot temperatures.  Eat healthy foods, such as: ? Low-fat (lean) proteins. ? Complex carbs (complex carbohydrates), such as whole wheat bread or brown rice. ? Fresh fruits and vegetables. ? Low-fat dairy products. ? Healthy fats.  Drink enough  fluid to keep your pee (urine) clear or pale yellow. If you have diabetes:   Make sure you know the symptoms of hyperglycemia.  Follow your diabetes management plan, as told by your doctor. Make sure you: ? Take insulin and medicines as told. ? Follow your exercise plan. ? Follow your meal plan. Eat on time. Do not skip meals. ? Check your blood sugar as often as told. Make sure to check before and after exercise. If you exercise longer or in a different way than you normally do, check your blood sugar more often. ? Follow your sick day plan whenever you cannot eat or drink normally. Make this plan ahead of time with your doctor.  Share your diabetes management plan with people in your workplace, school, and household.  Check your urine for ketones when you are ill and as told by your doctor.  Carry a card or wear jewelry that says that you have diabetes. Contact a doctor if:  Your blood sugar level is higher than 240 mg/dL (13.3 mmol/L) for 2 days in a row.  You have problems keeping your blood sugar in your target range.  High blood sugar happens often for you. Get help right away if:  You have trouble breathing.  You have a change in how you think, feel, or act (mental status).  You feel sick to your stomach (nauseous), and that feeling does not go away.  You cannot stop throwing up (vomiting). These symptoms may  be an emergency. Do not wait to see if the symptoms will go away. Get medical help right away. Call your local emergency services (911 in the [place] Do not drive yourself to the hospital. Summary  Hyperglycemia is when the sugar (glucose) level in your blood is too high.  High blood sugar can happen to people who do or do not have diabetes.  Make sure you drink enough fluids, eat healthy foods, and exercise regularly.  Contact your doctor if you have problems keeping your blood sugar in your target range. This information is not intended to replace advice given  to you by your health care provider. Make sure you discuss any questions you have with your health care provider. Document Revised: 12/25/2015 Document Reviewed: 12/25/2015 Elsevier Patient Education  [place].  Diabetic Ketoacidosis Diabetic ketoacidosis is a serious complication of diabetes. This condition develops when there is not enough insulin in the body. Insulin is an hormone that regulates blood sugar levels in the body. Normally, insulin allows glucose to enter the cells in the body. The cells break down glucose for energy. Without enough insulin, the body cannot break down glucose, so it breaks down fats instead. This leads to high blood glucose levels in the body and the production of acids that are called ketones. Ketones are poisonous at high levels. If diabetic ketoacidosis is not treated, it can cause severe dehydration and can lead to a coma or death. What are the causes? This condition develops when a lack of insulin causes the body to break down fats instead of glucose. This may be triggered by:  Stress on the body. This stress is brought on by an illness.  Infection.  Medicines that raise blood glucose levels.  Not taking diabetes medicine.  New onset of type 1 diabetes mellitus. What are the signs or symptoms? Symptoms of this condition include:  Fatigue.  Weight loss.  Excessive thirst.  Light-headedness.  Fruity or sweet-smelling breath.  Excessive urination.  Vision changes.  Confusion or irritability.  Nausea.  Vomiting.  Rapid breathing.  Abdominal pain.  Feeling flushed. How is this diagnosed? This condition is diagnosed based on your medical history, a physical exam, and blood tests. You may also have a urine test to check for ketones. How is this treated? This condition may be treated with:  Fluid replacement. This may be done to correct dehydration.  Insulin injections. These may be given through the skin or through an IV  tube.  Electrolyte replacement. Electrolytes are minerals in your blood. Electrolytes such as potassium and sodium may be given in pill form or through an IV tube.  Antibiotic medicines. These may be prescribed if your condition was caused by an infection. Diabetic ketoacidosis is a serious medical condition. You may need emergency treatment in the hospital to monitor your condition. Follow these instructions at home: Eating and drinking  Drink enough fluids to keep your urine clear or pale yellow.  If you are not able to eat, drink clear fluids in small amounts as you are able. Clear fluids include water, ice chips, fruit juice with water added (diluted), and low-calorie sports drinks. You may also have sugar-free jello or popsicles.  If you are able to eat, follow your usual diet and drink sugar-free liquids, such as water. Medicines  Take over-the-counter and prescription medicines only as told by your health care provider.  Continue to take insulin and other diabetes medicines as told by your health care provider.  If you  were prescribed an antibiotic, take it as told by your health care provider. Do not stop taking the antibiotic even if you start to feel better. General instructions   Check your urine for ketones when you are ill and as told by your health care provider. ? If your blood glucose is 240 mg/dL (13.3 mmol/L) or higher, check your urine ketones every 4-6 hours.  Check your blood glucose every day, as often as told by your health care provider. ? If your blood glucose is high, drink plenty of fluids. This helps to flush out ketones. ? If your blood glucose is above your target for 2 tests in a row, contact your health care provider.  Carry a medical alert card or wear medical alert jewelry that says that you have diabetes.  Rest and exercise only as told by your health care provider. Do not exercise when your blood glucose is high and you have ketones in your  urine.  If you get sick, call your health care provider and begin treatment quickly. Your body often needs extra insulin to fight an illness. Check your blood glucose every 4-6 hours when you are sick.  Keep all follow-up visits as told by your health care provider. This is important. Contact a health care provider if:  Your blood glucose level is higher than 240 mg/dL (13.3 mmol/L) for 2 days in a row.  You have moderate or large ketones in your urine.  You have a fever.  You cannot eat or drink without vomiting.  You have been vomiting for more than 2 hours.  You continue to have symptoms of diabetic ketoacidosis.  You develop new symptoms. Get help right away if:  Your blood glucose monitor reads "high" even when you are taking insulin.  You faint.  You have chest pain.  You have trouble breathing.  You have sudden trouble speaking or swallowing.  You have vomiting or diarrhea that gets worse after 3 hours.  You are unable to stay awake.  You have trouble thinking.  You are severely dehydrated. Symptoms of severe dehydration include: ? Extreme thirst. ? Dry mouth. ? Rapid breathing. These symptoms may represent a serious problem that is an emergency. Do not wait to see if the symptoms will go away. Get medical help right away. Call your local emergency services (911 in the [place] Do not drive yourself to the hospital. Summary  Diabetic ketoacidosis is a serious complication of diabetes. This condition develops when there is not enough insulin in the body.  This condition is diagnosed based on your medical history, a physical exam, and blood tests. You may also have a urine test to check for ketones.  Diabetic ketoacidosis is a serious medical condition. You may need emergency treatment in the hospital to monitor your condition.  Contact your health care provider if your blood glucose is higher than 240 mg/dl for 2 days in a row or if you have moderate or large  ketones in your urine. This information is not intended to replace advice given to you by your health care provider. Make sure you discuss any questions you have with your health care provider. Document Revised: 05/24/2016 Document Reviewed: 05/13/2016 Elsevier Patient Education  [place].  Hypoglycemia Hypoglycemia is when the sugar (glucose) level in your blood is too low. Signs of low blood sugar may include:  Feeling: ? Hungry. ? Worried or nervous (anxious). ? Sweaty and clammy. ? Confused. ? Dizzy. ? Sleepy. ? Sick to your  stomach (nauseous).  Having: ? A fast heartbeat. ? A headache. ? A change in your vision. ? Tingling or no feeling (numbness) around your mouth, lips, or tongue. ? Jerky movements that you cannot control (seizure).  Having trouble with: ? Moving (coordination). ? Sleeping. ? Passing out (fainting). ? Getting upset easily (irritability). Low blood sugar can happen to people who have diabetes and people who do not have diabetes. Low blood sugar can happen quickly, and it can be an emergency. Treating low blood sugar Low blood sugar is often treated by eating or drinking something sugary right away, such as:  Fruit juice, 4-6 oz (120-150 mL).  Regular soda (not diet soda), 4-6 oz (120-150 mL).  Low-fat milk, 4 oz (120 mL).  Several pieces of hard candy.  Sugar or honey, 1 Tbsp (15 mL). Treating low blood sugar if you have diabetes If you can think clearly and swallow safely, follow the 15:15 rule:  Take 15 grams of a fast-acting carb (carbohydrate). Talk with your doctor about how much you should take.  Always keep a source of fast-acting carb with you, such as: ? Sugar tablets (glucose pills). Take 3-4 pills. ? 6-8 pieces of hard candy. ? 4-6 oz (120-150 mL) of fruit juice. ? 4-6 oz (120-150 mL) of regular (not diet) soda. ? 1 Tbsp (15 mL) honey or sugar.  Check your blood sugar 15 minutes after you take the carb.  If your blood  sugar is still at or below 70 mg/dL (3.9 mmol/L), take 15 grams of a carb again.  If your blood sugar does not go above 70 mg/dL (3.9 mmol/L) after 3 tries, get help right away.  After your blood sugar goes back to normal, eat a meal or a snack within 1 hour.  Treating very low blood sugar If your blood sugar is at or below 54 mg/dL (3 mmol/L), you have very low blood sugar (severe hypoglycemia). This may also cause:  Passing out.  Jerky movements you cannot control (seizure).  Losing consciousness (coma). This is an emergency. Do not wait to see if the symptoms will go away. Get medical help right away. Call your local emergency services (911 in the [place] Do not drive yourself to the hospital. If you have very low blood sugar and you cannot eat or drink, you may need a glucagon shot (injection). A family member or friend should learn how to check your blood sugar and how to give you a glucagon shot. Ask your doctor if you need to have a glucagon shot kit at home. Follow these instructions at home: General instructions  Take over-the-counter and prescription medicines only as told by your doctor.  Stay aware of your blood sugar as told by your doctor.  Limit alcohol intake to no more than 1 drink a day for nonpregnant women and 2 drinks a day for men. One drink equals 12 oz of beer (355 mL), 5 oz of wine (148 mL), or 1 oz of hard liquor (44 mL).  Keep all follow-up visits as told by your doctor. This is important. If you have diabetes:   Follow your diabetes care plan as told by your doctor. Make sure you: ? Know the signs of low blood sugar. ? Take your medicines as told. ? Follow your exercise and meal plan. ? Eat on time. Do not skip meals. ? Check your blood sugar as often as told by your doctor. Always check it before and after exercise. ? Follow your sick  day plan when you cannot eat or drink normally. Make this plan ahead of time with your doctor.  Share your diabetes  care plan with: ? Your work or school. ? People you live with.  Check your pee (urine) for ketones: ? When you are sick. ? As told by your doctor.  Carry a card or wear jewelry that says you have diabetes. Contact a doctor if:  You have trouble keeping your blood sugar in your target range.  You have low blood sugar often. Get help right away if:  You still have symptoms after you eat or drink something sugary.  Your blood sugar is at or below 54 mg/dL (3 mmol/L).  You have jerky movements that you cannot control.  You pass out. These symptoms may be an emergency. Do not wait to see if the symptoms will go away. Get medical help right away. Call your local emergency services (911 in the [place] Do not drive yourself to the hospital. Summary  Hypoglycemia happens when the level of sugar (glucose) in your blood is too low.  Low blood sugar can happen to people who have diabetes and people who do not have diabetes. Low blood sugar can happen quickly, and it can be an emergency.  Make sure you know the signs of low blood sugar and know how to treat it.  Always keep a source of sugar (fast-acting carb) with you to treat low blood sugar. This information is not intended to replace advice given to you by your health care provider. Make sure you discuss any questions you have with your health care provider. Document Revised: 07/30/2018 Document Reviewed: 05/12/2015 Elsevier Patient Education  2020 [employer].

## 2020-03-19 LAB — CULTURE, BLOOD (ROUTINE X 2)
Culture: NO GROWTH
Culture: NO GROWTH

## 2020-03-21 ENCOUNTER — Telehealth: Payer: Self-pay | Admitting: *Deleted

## 2020-03-21 NOTE — Telephone Encounter (Signed)
Nurse called back an stated she had pt move to sofa and BP 93/66 then stand and 92/60 I have reiterated to go to urg care now.

## 2020-03-21 NOTE — Telephone Encounter (Signed)
[facility].  Calls to state she is in home for first visit, she was not informed that pt does not speak english, daughter is translating Pt's BP is 84/62 HR 74 02 sat 98% temp oral 96.8 Pt denies pain but states she is dizzy cbg this am 88 15 mins ago 267, she took 35 units of novolog this am Pt states she is taking all her medicine the way she is suppose to do. [facility] is advised to have family take pt to urg care as BP continues to be in 80/low 60's and dizziness continues. Daughter states she cannot bring pt to clinic but will take her to urg care now.

## 2020-03-21 NOTE — Telephone Encounter (Signed)
I agree

## 2020-03-23 ENCOUNTER — Telehealth: Payer: Self-pay | Admitting: *Deleted

## 2020-03-23 ENCOUNTER — Other Ambulatory Visit: Payer: Self-pay | Admitting: *Deleted

## 2020-03-23 ENCOUNTER — Telehealth: Payer: Medicaid Other

## 2020-03-23 DIAGNOSIS — I480 Paroxysmal atrial fibrillation: Secondary | ICD-10-CM

## 2020-03-23 MED ORDER — APIXABAN 5 MG PO TABS: 5.0000 mg | ORAL_TABLET | Freq: Two times a day (BID) | ORAL | 1 refills | Status: DC

## 2020-03-23 NOTE — Telephone Encounter (Signed)
[place] RN states BP today 133/83. Pt has lisinopril in the home but has quit taking it, [facility] has encouraged pt to take meds as prescribed,   but no eliquis in home CBG 68 fasting then she had a meal then it was 188 but not sure how long after the meal it was checked Pt cannot give times of actions

## 2020-03-23 NOTE — Telephone Encounter (Signed)
Patient's daughter called in stating Eliquis was not at pharmacy. Spoke with [name] at [facility]. States they picked up Eliquis on 02/10/2020 and 03/09/2020. Daughter states when [name] RN came to house, it wasn't there. Daughter will look for it and call back if they are unable to locate it. Also, [name] states patient has not picked up lisinopril since 10/12/2019. Daughter states patient now has low BP and is no longer taking it. Hubbard Hartshorn, BSN, RN-BC

## 2020-03-23 NOTE — Telephone Encounter (Signed)
°  Chronic Care Management   Outreach Note  03/23/2020 Name: [name] MRN: [id number] DOB: Sep 14, 1970  Referred by: Jose Persia, MD Reason for referral : Care Coordination (DM, HTN, HLD)   An unsuccessful telephone outreach was attempted today. Received referral on 03/22/20 from [facility] Management assistant [facility] in regards to call patient placed to nurse call line on 03/21/20. See details (in bold text)  below:  Patient: Jennifer [name] DOB: April 16, 1971 Phone: [phone] Complaint: low blood pressure reading  03/21/20 1710- spoke with Son of patient, Minus Breeding, who states patient is asymptomatic but had a low BP reading of 90/54 earlier. She was d/c from the hospital 4 days ago and has a history of HTN & DM. She has not taken anti-hypertensives in the last 2 days. He states most of the time her BP runs upper limits of normal so he was concerned since it was on the low end. Denies any lightheadedness, dizziness, or weakness and anti-hypertensive use. Advised him to recheck BP and per protocol if SBP drops below 90, if any symptoms are present, or she becomes sick/worse to call back or see PCP. He wanted advice on foods she could eat to help with BP. I advised him to make sure she was drinking adequate fluids as dehydration is common after a hospital admission and affects BP in elderly patients. Thoroughly educated him and he will recheck and call us back if any of the previous points apply.  Protocol Used: Blood Pressure - Low (Adult) Protocol-Based Disposition: See PCP within 3 Days  Positive Triage Question: * [2] Fall in systolic BP > 20 mm Hg from normal AND [2] NOT dizzy, lightheaded, or weak * All higher-acuity triage questions were negative  Care Advice Discussed: * Reassurance and Education * Reasons To Call Back   - Lightheadedness, weakness, or dizziness occurs   - Systolic BP under 90   - You feel sick   - You become  worse.  Thanks,   Golden Hurter, BSN RN Nurse [facility] [place] [id number]     Follow Up Plan: Using [place] ID# [id number], a  HIPAA compliant phone message was left for the patient providing contact information and requesting a return call.   Kelli Churn RN, CCM, [facility] RN Care Manager [phone]

## 2020-03-23 NOTE — Telephone Encounter (Signed)
Thanks

## 2020-03-27 ENCOUNTER — Ambulatory Visit (INDEPENDENT_AMBULATORY_CARE_PROVIDER_SITE_OTHER): Payer: Medicaid Other | Admitting: Internal Medicine

## 2020-03-27 ENCOUNTER — Encounter: Payer: Self-pay | Admitting: Internal Medicine

## 2020-03-27 VITALS — BP 126/65 | HR 85 | Temp 97.8°F | Ht 66.0 in | Wt 197.6 lb

## 2020-03-27 DIAGNOSIS — I480 Paroxysmal atrial fibrillation: Secondary | ICD-10-CM

## 2020-03-27 DIAGNOSIS — Z794 Long term (current) use of insulin: Secondary | ICD-10-CM

## 2020-03-27 DIAGNOSIS — D649 Anemia, unspecified: Secondary | ICD-10-CM | POA: Diagnosis not present

## 2020-03-27 DIAGNOSIS — L0390 Cellulitis, unspecified: Secondary | ICD-10-CM | POA: Insufficient documentation

## 2020-03-27 DIAGNOSIS — E11319 Type 2 diabetes mellitus with unspecified diabetic retinopathy without macular edema: Secondary | ICD-10-CM

## 2020-03-27 DIAGNOSIS — L03032 Cellulitis of left toe: Secondary | ICD-10-CM

## 2020-03-27 DIAGNOSIS — D509 Iron deficiency anemia, unspecified: Secondary | ICD-10-CM | POA: Diagnosis present

## 2020-03-27 MED ORDER — APIXABAN 5 MG PO TABS: 5.0000 mg | ORAL_TABLET | Freq: Two times a day (BID) | ORAL | 2 refills | Status: DC

## 2020-03-27 MED ORDER — TRULICITY 1.5 MG/0.5ML ~~LOC~~ SOAJ: 1.5000 mg | SUBCUTANEOUS | 11 refills | Status: DC

## 2020-03-27 NOTE — Progress Notes (Signed)
   CC: Uncontrolled type 2 diabetes mellitus, hospital follow-up  HPI:  [name] is a 49 y.o. with medical history listed below presented to follow-up after recent hospital visit.  Please see problem based charting for further details.  Past Medical History:  Diagnosis Date  . Anemia, iron deficiency   . Atrial fibrillation ([place])   . Blindness of left eye   . Decreased visual acuity    Left eye  . 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 Hair loss   . History of fracture of clavicle 05/18/2015  . Hyperlipidemia   . Iron deficiency anemia 05/13/2013  . Pain, dental 08/19/2018   Tooth pain/facial swelling: has poor dentition at baseline, history of dental abscess.  She has not seen a dentist in about one year.  Dentist is on [address].  No fevers chills or systemic symptoms.  Diabetes has been poorly controlled for some time.  She said it has improved recently averaging around 140.  Called the dentist said they would not see patients until May 14th but the urgency o  . Pap smear abnormality of cervix with LGSIL   . Routine/ritual circumcision   . Type II diabetes mellitus ([place])   . Uveitis    Review of Systems:  As per HPI  Physical Exam:  Vitals:   03/27/20 1331  BP: 126/65  Pulse: 85  Temp: 97.8 F (36.6 C)  TempSrc: Oral  SpO2: 97%  Weight: 197 lb 9.6 oz (89.6 kg)  Height: 5\' 6"  (1.676 m)   Physical Exam Vitals reviewed.  Constitutional:      Appearance: Normal appearance.  Cardiovascular:     Rate and Rhythm: Normal rate.     Heart sounds: Murmur: ?Soft systolic mumur?   Pulmonary:     Breath sounds: Normal breath sounds. No wheezing.  Musculoskeletal:        General: No swelling or tenderness.     Right lower leg: No edema.     Left lower leg: No edema.  Skin:    Findings: Lesion present.  Neurological:      Mental Status: She is alert.  Psychiatric:        Mood and Affect: Mood normal.        Behavior: Behavior normal.       Assessment & Plan:   See Encounters Tab for problem based charting.  Patient discussed with Dr. Dareen Piano

## 2020-03-27 NOTE — Assessment & Plan Note (Signed)
Left second toe wound: During her hospitalization, she was found to have wound at the dorsal aspect of her left second toe for which she was initially treated with IV vancomycin and cefepime which was later transitioned to cefazolin and subsequently discharged on Keflex.  She is compliant and has finished her course of Keflex and is doing well.  Her wound appears to be healing.  She has been advised for proper foot care.

## 2020-03-27 NOTE — Assessment & Plan Note (Signed)
Normocytic iron deficiency anemia of unclear etiology: In the hospital she was found to have a hemoglobin with range 8.8-9.5, MCV of 85.  Iron studies revealed ferritin 99, iron 26, TIBC 206.  His most likely suggestive of anemia of chronic disease.  Assessment and plan: Her laboratory results is suggestive of anemia of chronic disease likely secondary to underlying uncontrolled diabetes mellitus.  However given her age, will refer to GI for screening colonoscopy.  Follow-up CBC.

## 2020-03-27 NOTE — Patient Instructions (Signed)
Ms. Beckstrom,  Thanks for seeing me today.  Here my recommendations after our visit.  1.  I have given you samples of Eliquis 5 mg.  Please take this until your next refill 2.  Increase Trulicity to 1.5 mg/week 3.  For your kidney pain.  Take 30 units in the morning and 15 units at night.  Take care! Dr. Eileen Stanford  Please call the internal medicine center clinic if you have any questions or concerns, we may be able to help and keep you from a long and expensive emergency room wait. Our clinic and after hours phone number is [phone], the best time to call is Monday through Friday 9 am to 4 pm but there is always someone available 24/7 if you have an emergency. If you need medication refills please notify your pharmacy one week in advance and they will send [place] a request.   If you have not gotten the COVID vaccine, I recommend doing so:  You may get it at your local [facility] or [facility] OR To schedule an appointment for a COVID vaccine or be added to the vaccine wait list: Go to [email or web address]   OR Go to [email or web address]                  OR Call [phone]                                     OR Call [phone] and select Option 2

## 2020-03-27 NOTE — Assessment & Plan Note (Signed)
Type 2 diabetes mellitus: Uncontrolled.  [name] has had uncontrolled diabetes dating back to September 2020.  Her A1c has always been greater than 14%.  She was recently admitted to the hospital with DKA between November 22 to March 18, 2020 and was appropriately managed and discharged.  At the time of her hospitalization, medication adjustments included been instructed to decrease her Novolin to 30 units in the morning and 20 units at night.  She does have her blood glucose log with her and following her hospitalization, she still noted to have several hypoglycemic episodes with CBGs in the range of 77-84.  She states that her diet is consisting of eggs, salads.  She was at her appointment with her daughter and we had an extensive discussion regarding management of her diabetes including dietary changes.  Plan: -Continue Metformin 1000 mg twice daily -Increase Trulicity to 1.5 mg daily (given samples today in the clinic) -Decrease Novolin to 30 units in the morning and 15 units at night

## 2020-03-28 LAB — BMP8+ANION GAP
Anion Gap: 11 mmol/L (ref 10.0–18.0)
BUN/Creatinine Ratio: 15 (ref 9–23)
BUN: 14 mg/dL (ref 6–24)
CO2: 28 mmol/L (ref 20–29)
Calcium: 9.4 mg/dL (ref 8.7–10.2)
Chloride: 103 mmol/L (ref 96–106)
Creatinine, Ser: 0.92 mg/dL (ref 0.57–1.00)
GFR calc Af Amer: 85 mL/min/{1.73_m2} (ref >59)
GFR calc non Af Amer: 74 mL/min/{1.73_m2} (ref >59)
Glucose: 176 mg/dL — ABNORMAL HIGH (ref 65–99)
Potassium: 4.6 mmol/L (ref 3.5–5.2)
Sodium: 142 mmol/L (ref 134–144)

## 2020-03-28 LAB — CBC
Hematocrit: 30.3 % — ABNORMAL LOW (ref 34.0–46.6)
Hemoglobin: 9.6 g/dL — ABNORMAL LOW (ref 11.1–15.9)
MCH: 26.9 pg (ref 26.6–33.0)
MCHC: 31.7 g/dL (ref 31.5–35.7)
MCV: 85 fL (ref 79–97)
Platelets: 241 10*3/uL (ref 150–450)
RBC: 3.57 x10E6/uL — ABNORMAL LOW (ref 3.77–5.28)
RDW: 12.3 % (ref 11.7–15.4)
WBC: 5.2 10*3/uL (ref 3.4–10.8)

## 2020-03-28 LAB — C-REACTIVE PROTEIN: CRP: 29 mg/L — ABNORMAL HIGH (ref 0–10)

## 2020-03-30 NOTE — Progress Notes (Signed)
Internal Medicine Clinic Attending  Case discussed with Dr. Agyei  At the time of the visit.  We reviewed the resident's history and exam and pertinent patient test results.  I agree with the assessment, diagnosis, and plan of care documented in the resident's note.  

## 2020-03-31 ENCOUNTER — Telehealth: Payer: Self-pay | Admitting: *Deleted

## 2020-03-31 NOTE — Telephone Encounter (Signed)
Marissa Nestle, RN with [facility] called from patient's home with med questions. She is using patient's daughter as Astronomer. Explained she may do better with [employer]. She reports patient's fasting AM CBG 244, and meter with most numbers in the 200s. Patient has been taking only 10 units novolin in the evening instead of 15 as instructed. She will have her increase this to 15 units. Patient has only taken 2 doses of Eliquis but knows to take this daily going forward. Jana Half has not yet taken patient's BP. She will call back if this is elevated.   Also, reminded Jana Half and daughter that patient has appt with Diabetic Educator on [employer] on 04/03/2020 at 1030. Contact info given. Explained all this is written on AVS and it would be helpful if they could locate it. Hubbard Hartshorn, BSN, RN-BC

## 2020-03-31 NOTE — Telephone Encounter (Signed)
Jana Half called back to report patient's BP 117/69. Hubbard Hartshorn, BSN, RN-BC

## 2020-04-03 ENCOUNTER — Encounter: Payer: Medicaid Other | Admitting: Registered"

## 2020-04-07 ENCOUNTER — Other Ambulatory Visit: Payer: Self-pay

## 2020-04-07 ENCOUNTER — Other Ambulatory Visit: Payer: Self-pay | Admitting: Student

## 2020-04-07 ENCOUNTER — Other Ambulatory Visit: Payer: Self-pay | Admitting: *Deleted

## 2020-04-07 ENCOUNTER — Inpatient Hospital Stay (HOSPITAL_COMMUNITY)
Admission: EM | Admit: 2020-04-07 | Discharge: 2020-04-13 | DRG: 690 | Disposition: A | Discharge status: Home / Self Care | Payer: Medicaid Other | Attending: Internal Medicine | Admitting: Internal Medicine

## 2020-04-07 DIAGNOSIS — K5900 Constipation, unspecified: Secondary | ICD-10-CM | POA: Diagnosis not present

## 2020-04-07 DIAGNOSIS — Z794 Long term (current) use of insulin: Secondary | ICD-10-CM

## 2020-04-07 DIAGNOSIS — N10 Acute tubulo-interstitial nephritis: Principal | ICD-10-CM | POA: Diagnosis present

## 2020-04-07 DIAGNOSIS — N170 Acute kidney failure with tubular necrosis: Secondary | ICD-10-CM | POA: Diagnosis present

## 2020-04-07 DIAGNOSIS — I5032 Chronic diastolic (congestive) heart failure: Secondary | ICD-10-CM | POA: Diagnosis present

## 2020-04-07 DIAGNOSIS — I130 Hypertensive heart and chronic kidney disease with heart failure and stage 1 through stage 4 chronic kidney disease, or unspecified chronic kidney disease: Secondary | ICD-10-CM | POA: Diagnosis present

## 2020-04-07 DIAGNOSIS — E11649 Type 2 diabetes mellitus with hypoglycemia without coma: Secondary | ICD-10-CM | POA: Diagnosis not present

## 2020-04-07 DIAGNOSIS — R710 Precipitous drop in hematocrit: Secondary | ICD-10-CM | POA: Diagnosis present

## 2020-04-07 DIAGNOSIS — N12 Tubulo-interstitial nephritis, not specified as acute or chronic: Secondary | ICD-10-CM | POA: Diagnosis present

## 2020-04-07 DIAGNOSIS — Z833 Family history of diabetes mellitus: Secondary | ICD-10-CM

## 2020-04-07 DIAGNOSIS — E1122 Type 2 diabetes mellitus with diabetic chronic kidney disease: Secondary | ICD-10-CM | POA: Diagnosis present

## 2020-04-07 DIAGNOSIS — H5462 Unqualified visual loss, left eye, normal vision right eye: Secondary | ICD-10-CM | POA: Diagnosis present

## 2020-04-07 DIAGNOSIS — Z79899 Other long term (current) drug therapy: Secondary | ICD-10-CM

## 2020-04-07 DIAGNOSIS — R8281 Pyuria: Secondary | ICD-10-CM | POA: Diagnosis present

## 2020-04-07 DIAGNOSIS — E1165 Type 2 diabetes mellitus with hyperglycemia: Secondary | ICD-10-CM | POA: Diagnosis present

## 2020-04-07 DIAGNOSIS — Z8249 Family history of ischemic heart disease and other diseases of the circulatory system: Secondary | ICD-10-CM

## 2020-04-07 DIAGNOSIS — Z6831 Body mass index (BMI) 31.0-31.9, adult: Secondary | ICD-10-CM

## 2020-04-07 DIAGNOSIS — N182 Chronic kidney disease, stage 2 (mild): Secondary | ICD-10-CM | POA: Diagnosis present

## 2020-04-07 DIAGNOSIS — R34 Anuria and oliguria: Secondary | ICD-10-CM | POA: Diagnosis present

## 2020-04-07 DIAGNOSIS — N179 Acute kidney failure, unspecified: Secondary | ICD-10-CM

## 2020-04-07 DIAGNOSIS — Z7901 Long term (current) use of anticoagulants: Secondary | ICD-10-CM

## 2020-04-07 DIAGNOSIS — R81 Glycosuria: Secondary | ICD-10-CM | POA: Diagnosis present

## 2020-04-07 DIAGNOSIS — I959 Hypotension, unspecified: Secondary | ICD-10-CM | POA: Diagnosis present

## 2020-04-07 DIAGNOSIS — Z20822 Contact with and (suspected) exposure to covid-19: Secondary | ICD-10-CM | POA: Diagnosis present

## 2020-04-07 DIAGNOSIS — E785 Hyperlipidemia, unspecified: Secondary | ICD-10-CM | POA: Diagnosis present

## 2020-04-07 DIAGNOSIS — E669 Obesity, unspecified: Secondary | ICD-10-CM | POA: Diagnosis present

## 2020-04-07 DIAGNOSIS — B961 Klebsiella pneumoniae [K. pneumoniae] as the cause of diseases classified elsewhere: Secondary | ICD-10-CM | POA: Diagnosis present

## 2020-04-07 DIAGNOSIS — Z1611 Resistance to penicillins: Secondary | ICD-10-CM | POA: Diagnosis not present

## 2020-04-07 LAB — COMPREHENSIVE METABOLIC PANEL
ALT: 27 U/L (ref 0–44)
AST: 30 U/L (ref 15–41)
Albumin: 2.8 g/dL — ABNORMAL LOW (ref 3.5–5.0)
Alkaline Phosphatase: 127 U/L — ABNORMAL HIGH (ref 38–126)
Anion gap: 12 (ref 5–15)
BUN: 13 mg/dL (ref 6–20)
CO2: 25 mmol/L (ref 22–32)
Calcium: 9.1 mg/dL (ref 8.9–10.3)
Chloride: 95 mmol/L — ABNORMAL LOW (ref 98–111)
Creatinine, Ser: 1.13 mg/dL — ABNORMAL HIGH (ref 0.44–1.00)
GFR, Estimated: >60 mL/min (ref >60)
Glucose, Bld: 369 mg/dL — ABNORMAL HIGH (ref 70–99)
Potassium: 4.5 mmol/L (ref 3.5–5.1)
Sodium: 132 mmol/L — ABNORMAL LOW (ref 135–145)
Total Bilirubin: 1.1 mg/dL (ref 0.3–1.2)
Total Protein: 7.5 g/dL (ref 6.5–8.1)

## 2020-04-07 LAB — URINALYSIS, ROUTINE W REFLEX MICROSCOPIC
Bilirubin Urine: NEGATIVE
Glucose, UA: >=500 mg/dL — AB
Ketones, ur: NEGATIVE mg/dL
Nitrite: NEGATIVE
Protein, ur: 100 mg/dL — AB
Specific Gravity, Urine: 1.013 (ref 1.005–1.030)
WBC, UA: >50 WBC/hpf — ABNORMAL HIGH (ref 0–5)
pH: 6 (ref 5.0–8.0)

## 2020-04-07 LAB — CBC
HCT: 32.8 % — ABNORMAL LOW (ref 36.0–46.0)
Hemoglobin: 9.9 g/dL — ABNORMAL LOW (ref 12.0–15.0)
MCH: 26 pg (ref 26.0–34.0)
MCHC: 30.2 g/dL (ref 30.0–36.0)
MCV: 86.1 fL (ref 80.0–100.0)
Platelets: 184 10*3/uL (ref 150–400)
RBC: 3.81 MIL/uL — ABNORMAL LOW (ref 3.87–5.11)
RDW: 13.2 % (ref 11.5–15.5)
WBC: 6.1 10*3/uL (ref 4.0–10.5)
nRBC: 0 % (ref 0.0–0.2)

## 2020-04-07 LAB — I-STAT BETA HCG BLOOD, ED (MC, WL, AP ONLY): I-stat hCG, quantitative: 5.6 m[IU]/mL — ABNORMAL HIGH (ref <5)

## 2020-04-07 LAB — LIPASE, BLOOD: Lipase: 18 U/L (ref 11–51)

## 2020-04-07 MED ORDER — ACETAMINOPHEN 325 MG PO TABS
650.0000 mg | ORAL_TABLET | Freq: Once | ORAL | Status: DC
  Filled 2020-04-07: qty 2

## 2020-04-07 NOTE — ED Triage Notes (Signed)
Pt presents to ED BIB [facility] from home. Pt c/o abd pain, nausea. Pt reports that last time she felt like this she was in DKA.  EMS Vs -  HR - 88 160/100 CBG - 423

## 2020-04-07 NOTE — Telephone Encounter (Signed)
Metformin #180 with 3 refills sent 10/13/2019. Patient needs to call Pharmacy for refill. Will forward request for refill on zinc.   Of note, patient no showed appt with Diabetic Educator on 04/03/2020. Hubbard Hartshorn, BSN, RN-BC

## 2020-04-07 NOTE — Telephone Encounter (Signed)
Requesting refill on Metformin and Zinc called to [facility] on [address].

## 2020-04-08 ENCOUNTER — Encounter (HOSPITAL_COMMUNITY): Payer: Self-pay | Admitting: Internal Medicine

## 2020-04-08 ENCOUNTER — Emergency Department (HOSPITAL_COMMUNITY): Payer: Medicaid Other

## 2020-04-08 LAB — GLUCOSE, CAPILLARY: Glucose-Capillary: 284 mg/dL — ABNORMAL HIGH (ref 70–99)

## 2020-04-08 LAB — RESP PANEL BY RT-PCR (FLU A&B, COVID) ARPGX2
Influenza A by PCR: NEGATIVE
Influenza B by PCR: NEGATIVE
SARS Coronavirus 2 by RT PCR: NEGATIVE

## 2020-04-08 LAB — I-STAT CHEM 8, ED
BUN: 22 mg/dL — ABNORMAL HIGH (ref 6–20)
Calcium, Ion: 1.11 mmol/L — ABNORMAL LOW (ref 1.15–1.40)
Chloride: 96 mmol/L — ABNORMAL LOW (ref 98–111)
Creatinine, Ser: 1.2 mg/dL — ABNORMAL HIGH (ref 0.44–1.00)
Glucose, Bld: 416 mg/dL — ABNORMAL HIGH (ref 70–99)
HCT: 33 % — ABNORMAL LOW (ref 36.0–46.0)
Hemoglobin: 11.2 g/dL — ABNORMAL LOW (ref 12.0–15.0)
Potassium: 4.1 mmol/L (ref 3.5–5.1)
Sodium: 131 mmol/L — ABNORMAL LOW (ref 135–145)
TCO2: 23 mmol/L (ref 22–32)

## 2020-04-08 LAB — CBG MONITORING, ED
Glucose-Capillary: 351 mg/dL — ABNORMAL HIGH (ref 70–99)
Glucose-Capillary: 294 mg/dL — ABNORMAL HIGH (ref 70–99)
Glucose-Capillary: 324 mg/dL — ABNORMAL HIGH (ref 70–99)

## 2020-04-08 LAB — PREGNANCY, URINE: Preg Test, Ur: NEGATIVE

## 2020-04-08 MED ORDER — IBUPROFEN 400 MG PO TABS
600.0000 mg | ORAL_TABLET | Freq: Once | ORAL | Status: AC
  Administered 2020-04-08: 14:00:00 600 mg via ORAL
  Filled 2020-04-08: qty 1

## 2020-04-08 MED ORDER — ENOXAPARIN SODIUM 40 MG/0.4ML ~~LOC~~ SOLN: 40.0000 mg | SUBCUTANEOUS | Status: DC

## 2020-04-08 MED ORDER — LACTATED RINGERS IV BOLUS
1000.0000 mL | Freq: Once | INTRAVENOUS | Status: AC
  Administered 2020-04-08: 20:00:00 1000 mL via INTRAVENOUS

## 2020-04-08 MED ORDER — ACETAMINOPHEN 325 MG PO TABS
650.0000 mg | ORAL_TABLET | Freq: Four times a day (QID) | ORAL | Status: DC | PRN
  Administered 2020-04-08 – 2020-04-09 (×2): 650 mg via ORAL
  Filled 2020-04-08 (×2): qty 2

## 2020-04-08 MED ORDER — IOHEXOL 300 MG/ML  SOLN
80.0000 mL | Freq: Once | INTRAMUSCULAR | Status: AC | PRN
  Administered 2020-04-08: 80 mL via INTRAVENOUS

## 2020-04-08 MED ORDER — SODIUM CHLORIDE 0.9 % IV SOLN
1.0000 g | Freq: Once | INTRAVENOUS | Status: AC
  Administered 2020-04-08: 1 g via INTRAVENOUS
  Filled 2020-04-08: qty 10

## 2020-04-08 MED ORDER — BRIMONIDINE TARTRATE 0.2 % OP SOLN
1.0000 [drp] | Freq: Two times a day (BID) | OPHTHALMIC | Status: DC
  Administered 2020-04-08 – 2020-04-13 (×10): 1 [drp] via OPHTHALMIC
  Filled 2020-04-08: qty 5

## 2020-04-08 MED ORDER — LACTATED RINGERS IV BOLUS
1000.0000 mL | Freq: Once | INTRAVENOUS | Status: AC
  Administered 2020-04-08: 15:00:00 1000 mL via INTRAVENOUS

## 2020-04-08 MED ORDER — APIXABAN 5 MG PO TABS
5.0000 mg | ORAL_TABLET | Freq: Two times a day (BID) | ORAL | Status: DC
  Administered 2020-04-08 – 2020-04-10 (×4): 5 mg via ORAL
  Filled 2020-04-08 (×4): qty 1

## 2020-04-08 MED ORDER — SODIUM CHLORIDE 0.9 % IV BOLUS
1000.0000 mL | Freq: Once | INTRAVENOUS | Status: AC
  Administered 2020-04-08: 10:00:00 1000 mL via INTRAVENOUS

## 2020-04-08 MED ORDER — ACETAMINOPHEN 325 MG PO TABS: 650.0000 mg | ORAL_TABLET | Freq: Four times a day (QID) | ORAL | Status: DC | PRN

## 2020-04-08 MED ORDER — ONDANSETRON HCL 4 MG/2ML IJ SOLN
4.0000 mg | Freq: Once | INTRAMUSCULAR | Status: AC
  Administered 2020-04-08: 10:00:00 4 mg via INTRAVENOUS
  Filled 2020-04-08: qty 2

## 2020-04-08 MED ORDER — INSULIN ASPART 100 UNIT/ML ~~LOC~~ SOLN
4.0000 [IU] | Freq: Once | SUBCUTANEOUS | Status: AC
  Administered 2020-04-08: 12:00:00 4 [IU] via INTRAVENOUS

## 2020-04-08 MED ORDER — ONDANSETRON HCL 4 MG/2ML IJ SOLN
4.0000 mg | Freq: Four times a day (QID) | INTRAMUSCULAR | Status: DC | PRN
  Administered 2020-04-09 – 2020-04-11 (×2): 4 mg via INTRAVENOUS
  Filled 2020-04-08 (×2): qty 2

## 2020-04-08 MED ORDER — MORPHINE SULFATE (PF) 4 MG/ML IV SOLN
4.0000 mg | Freq: Once | INTRAVENOUS | Status: AC
  Administered 2020-04-08: 10:00:00 4 mg via INTRAVENOUS
  Filled 2020-04-08: qty 1

## 2020-04-08 MED ORDER — ACETAMINOPHEN 500 MG PO TABS
1000.0000 mg | ORAL_TABLET | Freq: Once | ORAL | Status: AC
  Administered 2020-04-08: 09:00:00 1000 mg via ORAL
  Filled 2020-04-08: qty 2

## 2020-04-08 MED ORDER — INSULIN ASPART 100 UNIT/ML ~~LOC~~ SOLN
0.0000 [IU] | Freq: Three times a day (TID) | SUBCUTANEOUS | Status: DC
  Administered 2020-04-08: 17:00:00 11 [IU] via SUBCUTANEOUS
  Administered 2020-04-09: 06:00:00 15 [IU] via SUBCUTANEOUS

## 2020-04-08 MED ORDER — SODIUM CHLORIDE 0.9 % IV SOLN: 1.0000 g | INTRAVENOUS | Status: DC

## 2020-04-08 MED ORDER — HYDROMORPHONE HCL 1 MG/ML IJ SOLN
0.5000 mg | INTRAMUSCULAR | Status: DC | PRN
  Administered 2020-04-08 – 2020-04-09 (×2): 0.5 mg via INTRAVENOUS
  Filled 2020-04-08 (×2): qty 0.5

## 2020-04-08 MED ORDER — DULOXETINE HCL 60 MG PO CPEP
60.0000 mg | ORAL_CAPSULE | Freq: Every day | ORAL | Status: DC
Start: 2020-04-09 — End: 2020-04-10
  Administered 2020-04-09 – 2020-04-10 (×2): 60 mg via ORAL
  Filled 2020-04-08 (×2): qty 1

## 2020-04-08 MED ORDER — SODIUM CHLORIDE 0.9% FLUSH
3.0000 mL | Freq: Two times a day (BID) | INTRAVENOUS | Status: DC
  Administered 2020-04-08 – 2020-04-13 (×10): 3 mL via INTRAVENOUS

## 2020-04-08 MED ORDER — ATORVASTATIN CALCIUM 40 MG PO TABS
40.0000 mg | ORAL_TABLET | Freq: Every day | ORAL | Status: DC
  Administered 2020-04-09 – 2020-04-13 (×5): 40 mg via ORAL
  Filled 2020-04-08 (×5): qty 1

## 2020-04-08 NOTE — ED Notes (Signed)
Family would like to be notified if assistance is needed with translation.  Jennifer [name] - [phone]  Jennifer [name] - [phone]

## 2020-04-08 NOTE — H&P (Addendum)
Date: 04/08/2020               Patient Name:  Jennifer [name] MRN: [id number]  DOB: [birth year] Age / Sex: 49 y.o., female   PCP: Jose Persia, MD         Medical Service: Internal Medicine Teaching Service         Attending Physician: Dr. Angelica Pou, MD    First Contact: Dr. Demaris Callander Pager: [phone]  Second Contact: Dr. Charleen Kirks Pager: [phone]       After Hours (After 5p/  First Contact Pager: [phone]  weekends / holidays): Second Contact Pager: [phone]   Chief Complaint: Nausea, Vomiting, Back pain  History of Present Illness: 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 Patient's daughter at bedside  who interpreted for the patient and contributed to this history.  [name] states she was in her usual state of health until 2 days ago on 12/16, when she woke up from sleep at 4 am with extreme nausea and NBNB vomiting. She also started having low back pain bilaterally that was non-radiating.  She noted feeling feverish but did not check her temperature at home. Endorses rigors, diffuse abdominal pain, headache, and myalgias. However, she denies urinary symptoms, including dysuria, hematuria. Since developing symptoms, [name] has been unable to tolerate any PO intake.   ED course: Febrile (100.9 F), RR 16, BP ranged hypotensive to hypertensive, with SpO2>96% on room air. Pertinent lab includes Na 131, K 4.1, Glucose 416, Cr 1.20, BUN 22, Hb 11.2.  Urinalysis shows glucosuria, proteinuria (100), large leukocytes, >50 WBC, few bacteria. CT pelvis shows bilateral pyelonephritis, no discrete renal abscess. Received Rocephin in ED  Meds:  Current Outpatient Medications  Medication Instructions  . Accu-Chek FastClix Lancets MISC Check blood  sugar 4 times a day  . acetaminophen (TYLENOL) 500 mg, Oral, Every 6 hours PRN  . apixaban (ELIQUIS) 5 mg, Oral, 2 times daily  . atorvastatin (LIPITOR) 40 mg, Oral, Daily  . Blood Glucose Monitoring Suppl (ACCU-CHEK GUIDE) w/Device KIT 1 each, Does not apply, 4 times daily  . brimonidine (ALPHAGAN) 0.2 % ophthalmic solution 1 drop, Left Eye, 2 times daily  . carboxymethylcellul-glycerin (OPTIVE) 0.5-0.9 % ophthalmic solution 1 drop, Both Eyes, 2 times daily PRN  . cephALEXin (KEFLEX) 500 mg, Oral, Every 6 hours  . dorzolamide-timolol (COSOPT) 22.3-6.8 MG/ML ophthalmic solution 1 drop, Left Eye, 2 times daily  . DULoxetine (CYMBALTA) 60 mg, Oral, Daily  . ferrous sulfate 325 mg, Oral, Every M-W-F  . furosemide (LASIX) 20 mg, Oral, Daily PRN  . glucose blood (ACCU-CHEK GUIDE) test strip Check blood sugar 4 times per day  . insulin aspart (NOVOLOG) 10 Units, Subcutaneous, 3 times daily before meals  . insulin glargine (LANTUS) 10-30 Units, Subcutaneous, See admin instructions, Inject 30 units subcutaneously every morning and 10 units at night  . insulin NPH Human (NOVOLIN N) 35-45 Units, Subcutaneous, See admin instructions, 30 units in the morning, 20 units in the evening  . Insulin Pen Needle 31G X 5 MM MISC 1 Dose, Does not apply, 2 times daily  . lisinopril (ZESTRIL) 10 mg, Oral, Daily  . metFORMIN (GLUCOPHAGE) 1,000 mg, Oral, 2 times daily with meals  . Multiple Vitamins-Minerals (MULTIVITAMIN WITH MINERALS) tablet 1 tablet,  Oral, Daily  . Trulicity 1.5 mg, Subcutaneous, Weekly  . zinc gluconate 50 mg, Oral, Daily   Allergies: Allergies as of 04/07/2020  . (No Known Allergies)   Past Medical History:  Diagnosis Date  . Anemia, iron deficiency   . Atrial fibrillation ([place])   . Blindness of left eye   . Decreased visual acuity    Left eye  . 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 Hair loss   . History of fracture of clavicle 05/18/2015  . Hyperlipidemia   . Iron deficiency anemia 05/13/2013  . Pain, dental 08/19/2018   Tooth pain/facial swelling: has poor dentition at baseline, history of dental abscess.  She has not seen a dentist in about one year.  Dentist is on [address].  No fevers chills or systemic symptoms.  Diabetes has been poorly controlled for some time.  She said it has improved recently averaging around 140.  Called the dentist said they would not see patients until May 14th but the urgency o  . Pap smear abnormality of cervix with LGSIL   . Routine/ritual circumcision   . Type II diabetes mellitus ([place])   . Uveitis    Family History:  Family History  Problem Relation Age of Onset  . Diabetes Father    DM, HTN in both mother and father, No known h/o cancer.  Social History: Lives with 3 children and husband. No alcohol, tobacco or other drug use.  Review of Systems: A complete ROS was negative except as per HPI.   Physical Exam: Blood pressure (!) 108/47, pulse 84, temperature (!) 100.9 F (38.3 C), temperature source Oral, resp. rate 16, last menstrual period 08/21/2016, SpO2 96 %.  General: Well developed, obese female lying comfortably in bed, mild acute distress. HEENT: [place]/AT, dry mucus membrane Neck: supple, no lymphadenopathy CV: Regular rate and rhythm, 2/6 systolic murmur at RUSB Pulmonary: CTAB, normal work of breathing Abdominal: Soft, not distended, BS+, mild abdominal tenderness, CVA + B/L, flank pain Extremities: No peripheral edema Neuro: AAO*3, normal speech Psych: Anxious mood and affect, normal thought content, normal judgement  EKG: personally reviewed my interpretation is pending  CXR: personally reviewed my interpretation is no active disease.  CT scan Abdomen Pelvis:  IMPRESSION: 1. Heterogeneous enhancement involving both kidneys. Findings are suggestive for bilateral  pyelonephritis. Focal low density in the posterolateral left kidney measures roughly 8 mm and could represent focal phlegmon. No discrete renal abscesses at this time. 2. Mild urinary bladder wall thickening and likely related to cystitis based on the renal findings.  Assessment & Plan by Problem: Active Problems:   Acute pyelonephritis  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   # Bilateral Pyelonephritis Presented with fever, chills, flank pain and back pain. WBC's 6.1. Urinalysis shows glucosuria, proteinuria (100), large leukocytes, >50 WBC, few bacteria. CT pelvis shows bilateral pyelonephritis, no discrete renal abscess. Received Rocephin in ED. Will keep a close eye for changes in Jennifer [name] or symptomology, persistent fevers, as as risk of renal abscess formation. No previous cultures to assist with antibiotics, so will start with broad spectrum.   --Continue Rocephin  1 g daily. Will narrow based on cultures --F/U Urine culture --F/U Blood culture --dilaudid 0.5 mg IV q4h PRN  --Zofran 4 mg q6h PRN --Re-scan if elevated WBC's  # AKI SCr~1.20, BUN 22with eGFR on admission >60. Baseline sCR~ 0.9. Suspect pre-renal in setting of dehydration from poor intake and vomiting vs diabetic nephropathy. Will trend BMP  --Received 2000 total fluids (1 L LR and 1 L NS) --BMP daily --Avoid nephrotoxic drugs --Encourage oral intake  #Diabetes Mellitus  - SSI  # Hypertension Hypotensive due to acute illness.  -- Hold home lisinopril 10 mg daily  # Hyperlipidemia On Atorvastatin 40 mg daily  -- Resume Atorvastatin 40 mg daily  # Atrial fibrillation On Eliquis 5 mg daily. Currently in sinus tachycardia due to acute  illness. No need for rate control.   --Resume home Eliquis 5 mg daily  # HFpEF Most recent echo in 08/2018 with EF 50-55%. Prescribed Lasix 20 mg PO daily PRN for lower extremity swelling. Patient euvolemic at this time.   -- Hold home lasix  Dispo: Admit patient to Observation with expected stay less than 2 midnights.   Signed: Honor Junes, MD 04/08/2020, 5:47 PM  Pager: [phone] After 5pm on weekdays and 1pm on weekends: On Call pager: [phone]

## 2020-04-08 NOTE — ED Notes (Signed)
Patient transported to room from CT 

## 2020-04-08 NOTE — ED Notes (Signed)
Dinner Tray Ordered @ 1700. 

## 2020-04-08 NOTE — ED Notes (Signed)
Pharmacy at bedside

## 2020-04-08 NOTE — ED Notes (Signed)
Jennifer [name], daughter, [phone] would like an update when available

## 2020-04-08 NOTE — ED Provider Notes (Addendum)
[facility] EMERGENCY DEPARTMENT Provider Note   CSN: [id number] Arrival date & time: 04/07/20  1955     History Chief Complaint  Patient presents with  . Abdominal Pain    Jennifer [name] is a 49 y.o. female with PMH/o Anemia, Afib, Glaucoma, DM patient with upper abdominal pain, nausea/vomiting that began approximately 4:30 AM yesterday morning.  Reports that pain started acutely and has been progressively worsening since then.  She states it is worse in the epigastric, left upper quadrant region.  She she has had about 6 episodes of nonbloody, nonbilious emesis since then.  She has not had any diarrhea.  She states she has been able to tolerate small sips of water but not about it.  She is unsure of if she has had a fever.  She states she has had some cough but states that that has been persistent and is not new.  She has had a history of C-section but no other abdominal surgeries.  She has not had any diarrhea.  She denies any nasal congestion, rhinorrhea, chest pain, difficulty breathing, dysuria, hematuria, rash.  She has not been vaccinated for Covid.  She has not had any known Covid exposure.  She denies any alcohol use and does not smoke.  The history is provided by the patient. A language interpreter was used.       Past Medical History:  Diagnosis Date  . Anemia, iron deficiency   . Atrial fibrillation ([place])   . Blindness of left eye   . Decreased visual acuity    Left eye  . 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 Hair loss   . History of fracture of clavicle 05/18/2015  . Hyperlipidemia   . Iron deficiency anemia 05/13/2013  . Pain, dental 08/19/2018   Tooth pain/facial swelling: has poor dentition at baseline, history of dental abscess.  She has not seen a dentist in about one year.  Dentist is on [address].  No  fevers chills or systemic symptoms.  Diabetes has been poorly controlled for some time.  She said it has improved recently averaging around 140.  Called the dentist said they would not see patients until May 14th but the urgency o  . Pap smear abnormality of cervix with LGSIL   . Routine/ritual circumcision   . Type II diabetes mellitus ([place])   . Uveitis     Patient Active Problem List   Diagnosis Date Noted  . Normocytic anemia 03/27/2020  . Cellulitis (Left 2nd toe) 03/27/2020  . Cellulitis of left foot 03/17/2020  . DKA (diabetic ketoacidosis) ([place]) 03/14/2020  . Acute kidney injury ([place]) 03/14/2020  . Acute hypotension 03/10/2020  . Right ankle injury 09/28/2019  . Bilateral lower extremity edema 06/07/2019  . Atrial fibrillation ([place]) 09/20/2018  . Migraine   . Hematuria 08/24/2018  . Vulvovaginitis 05/21/2018  . (HFpEF) heart failure with preserved ejection fraction ([place]) 01/01/2018  . 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  . High risk medication use 09/16/2017  . Pseudophakia of right eye 09/16/2017  . Left anterior shoulder pain 07/06/2017  . Hypertension associated with diabetes ([place]) 07/23/2016  . 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 Fatigue 04/02/2016  . 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 Vitamin D deficiency 10/30/2014  . Hair loss 03/24/2012  . Healthcare maintenance 02/26/2011  . Hyperlipidemia 05/05/2006  .  Type II diabetes mellitus ([place]) 04/22/1998    Past Surgical History:  Procedure Laterality Date  . CESAREAN SECTION       OB History    Gravida  7   Para  4   Term  4   Preterm      AB  3   Living  4     SAB  3   IAB      Ectopic      Multiple      Live Births              Family History  Problem Relation Age of Onset  . Diabetes Father     Social History   Tobacco Use  . Smoking status: Never Smoker  . Smokeless tobacco: Never Used  Vaping Use  . Vaping Use: Never used  Substance Use Topics   . Alcohol use: No  . Drug use: No    Home Medications Prior to Admission medications   Medication Sig Start Date End Date Taking? Authorizing Provider  Accu-Chek FastClix Lancets MISC Check blood sugar 4 times a day 06/07/19   Jose Persia, MD  acetaminophen (TYLENOL) 500 MG tablet Take 500 mg by mouth every 6 (six) hours as needed for mild pain.    [provider]  apixaban (ELIQUIS) 5 MG TABS tablet Take 1 tablet (5 mg total) by mouth 2 (two) times daily. 03/27/20 06/25/20  Jean Rosenthal, MD  atorvastatin (LIPITOR) 40 MG tablet Take 1 tablet (40 mg total) by mouth daily. 06/30/19   Jose Persia, MD  Blood Glucose Monitoring Suppl (ACCU-CHEK GUIDE) w/Device KIT 1 each by Does not apply route 4 (four) times daily. 11/19/18   Jose Persia, MD  dorzolamide-timolol (COSOPT) 22.3-6.8 MG/ML ophthalmic solution Place 1 drop into the left eye 2 (two) times daily.    [provider]  Dulaglutide (TRULICITY) 1.5 RS/8.5IO SOPN Inject 1.5 mg into the skin once a week. 03/27/20 03/27/21  Jean Rosenthal, MD  DULoxetine (CYMBALTA) 60 MG capsule Take 60 mg by mouth daily.    [provider]  ferrous sulfate 325 (65 FE) MG tablet Take 1 tablet (325 mg total) by mouth every Monday, Wednesday, and Friday for 14 days. 03/20/20 04/03/20  Gaylan Gerold, DO  furosemide (LASIX) 20 MG tablet Take 1 tablet (20 mg total) by mouth daily as needed (leg swelling). 06/07/19   Jose Persia, MD  glucose blood (ACCU-CHEK GUIDE) test strip Check blood sugar 4 times per day 06/07/19   Jose Persia, MD  insulin NPH Human (NOVOLIN N) 100 UNIT/ML injection Inject 0.35-0.45 mLs (35-45 Units total) into the skin See admin instructions. 30 units in the morning, 20 units in the evening 03/18/20   Gaylan Gerold, DO  Insulin Pen Needle 31G X 5 MM MISC 1 Dose by Does not apply route 2 (two) times daily. 05/20/18   Alphonzo Grieve, MD  ketorolac (ACULAR) 0.5 % ophthalmic solution Place 1 drop into the left eye 4  (four) times daily. 27/0/35   Delora Fuel, MD  lisinopril (ZESTRIL) 10 MG tablet Take 1 tablet (10 mg total) by mouth daily. 06/30/19   Jose Persia, MD  metFORMIN (GLUCOPHAGE) 1000 MG tablet Take 1 tablet (1,000 mg total) by mouth 2 (two) times daily with a meal. 10/13/19   Jose Persia, MD  Multiple Vitamins-Minerals (MULTIVITAMIN WITH MINERALS) tablet Take 1 tablet by mouth daily.    [provider]  zinc gluconate 50 MG tablet Take 50 mg  by mouth daily.    [provider]    Allergies    Patient has no known allergies.  Review of Systems   Review of Systems  Constitutional: Positive for fever.  Respiratory: Positive for cough. Negative for shortness of breath.   Cardiovascular: Negative for chest pain.  Gastrointestinal: Positive for abdominal pain, nausea and vomiting. Negative for diarrhea.  Genitourinary: Negative for dysuria and hematuria.  Neurological: Negative for headaches.  All other systems reviewed and are negative.   Physical Exam Updated Vital Signs BP 126/64   Pulse 88   Temp 98.4 F (36.9 C) (Oral)   Resp 16   LMP 08/21/2016 (Exact Date)   SpO2 100%   Physical Exam Vitals and nursing note reviewed.  Constitutional:      Appearance: Normal appearance. She is well-developed and well-nourished.     Comments: Sitting comfortably on examination table  HENT:     Head: Normocephalic and atraumatic.     Mouth/Throat:     Mouth: Oropharynx is clear and moist and mucous membranes are normal.  Eyes:     General: Lids are normal.     Extraocular Movements: EOM normal.     Conjunctiva/sclera: Conjunctivae normal.     Pupils: Pupils are equal, round, and reactive to light.  Cardiovascular:     Rate and Rhythm: Normal rate and regular rhythm.     Pulses: Normal pulses.     Heart sounds: Normal heart sounds. No murmur heard. No friction rub. No gallop.   Pulmonary:     Effort: Pulmonary effort is normal.     Breath sounds: Normal breath  sounds.     Comments: Lungs clear to auscultation bilaterally.  Symmetric chest rise.  No wheezing, rales, rhonchi. Able to speak in full sentences. No evidence of respiratory distress.  Abdominal:     Palpations: Abdomen is soft. Abdomen is not rigid.     Tenderness: There is abdominal tenderness in the epigastric area and left upper quadrant. There is no left CVA tenderness or guarding.     Comments: Abdomen soft, nondistended.  Tenderness palpation epigastric and left upper quadrant region.  No rigidity, guarding.  No CVA tenderness noted bilaterally.  Musculoskeletal:        General: Normal range of motion.     Cervical back: Full passive range of motion without pain.  Skin:    General: Skin is warm and dry.     Capillary Refill: Capillary refill takes less than 2 seconds.     Comments: No evidence of rash, areas of erythema, warmth.  Neurological:     Mental Status: She is alert and oriented to person, place, and time.  Psychiatric:        Mood and Affect: Mood and affect normal.        Speech: Speech normal.     ED Results / Procedures / Treatments   Labs (all labs ordered are listed, but only abnormal results are displayed) Labs Reviewed  COMPREHENSIVE METABOLIC PANEL - Abnormal; Notable for the following components:      Result Value   Sodium 132 (*)    Chloride 95 (*)    Glucose, Bld 369 (*)    Creatinine, Ser 1.13 (*)    Albumin 2.8 (*)    Alkaline Phosphatase 127 (*)    All other components within normal limits  CBC - Abnormal; Notable for the following components:   RBC 3.81 (*)    Hemoglobin 9.9 (*)    HCT  32.8 (*)    All other components within normal limits  URINALYSIS, ROUTINE W REFLEX MICROSCOPIC - Abnormal; Notable for the following components:   APPearance CLOUDY (*)    Glucose, UA >=500 (*)    Hgb urine dipstick MODERATE (*)    Protein, ur 100 (*)    Leukocytes,Ua LARGE (*)    WBC, UA >50 (*)    Bacteria, UA FEW (*)    All other components within  normal limits  I-STAT BETA HCG BLOOD, ED ([facility], [facility], [facility] ONLY) - Abnormal; Notable for the following components:   I-stat hCG, quantitative 5.6 (*)    All other components within normal limits  CBG MONITORING, ED - Abnormal; Notable for the following components:   Glucose-Capillary 351 (*)    All other components within normal limits  I-STAT CHEM 8, ED - Abnormal; Notable for the following components:   Sodium 131 (*)    Chloride 96 (*)    BUN 22 (*)    Creatinine, Ser 1.20 (*)    Glucose, Bld 416 (*)    Calcium, Ion 1.11 (*)    Hemoglobin 11.2 (*)    HCT 33.0 (*)    All other components within normal limits  RESP PANEL BY RT-PCR (FLU A&B, COVID) ARPGX2  URINE CULTURE  CULTURE, BLOOD (ROUTINE X 2)  CULTURE, BLOOD (ROUTINE X 2)  LIPASE, BLOOD  PREGNANCY, URINE    EKG None  Radiology CT ABDOMEN PELVIS W CONTRAST  Result Date: 04/08/2020 CLINICAL DATA:  49 year old with nonlocalized abdominal pain. EXAM: CT ABDOMEN AND PELVIS WITH CONTRAST TECHNIQUE: Multidetector CT imaging of the abdomen and pelvis was performed using the standard protocol following bolus administration of intravenous contrast. CONTRAST:  60m OMNIPAQUE IOHEXOL 300 MG/ML  SOLN COMPARISON:  03/15/2020 and 03/08/2020. FINDINGS: Lower chest: Lung bases are clear.  No pleural effusions. Hepatobiliary: No focal liver abnormality is seen. No gallstones, gallbladder wall thickening, or biliary dilatation. Pancreas: Unremarkable. No pancreatic ductal dilatation or surrounding inflammatory changes. Spleen: Normal in size without focal abnormality. Adrenals/Urinary Tract: Normal appearance of both adrenal glands. Enhancement pattern is slightly heterogeneous in the right kidney with decreased enhancement along the medial right upper pole and posterior mid aspect of the right kidney. These findings are best seen on the delayed images. These findings are different from the exam on 03/08/2020. Findings are suggestive for inflammation  and pyelonephritis. Focal low-density in the posterolateral left kidney on sequence 8, image 18 is also suggestive for focus of inflammation. Focal low-density area in the left posterolateral aspect of left kidney measures roughly 8 mm. Mild perinephric edema. Negative for hydronephrosis. No evidence for obstructing urinary stones. Bladder is mildly distended. Again noted is a tiny calcification along the anterior wall of the urinary bladder. Mild urinary bladder wall thickening based on the degree of distension. Stomach/Bowel: Small amount of high-density material in the right colon likely represents residual contrast from study in March 15, 2020. Appendix has a normal appearance without inflammatory changes. There is no evidence for bowel obstruction or focal bowel inflammation. Vascular/Lymphatic: Abdominal aorta and main visceral arteries are patent. Main portal venous system is patent. IVC and renal veins have a normal appearance. Atherosclerotic calcifications in the proximal femoral arteries. No significant lymph node enlargement in the abdomen or pelvis. Reproductive: Uterus and bilateral adnexa are unremarkable. Other: Negative for ascites. Negative for free air. Tiny umbilical hernia containing fat. Musculoskeletal: No acute bone abnormality. IMPRESSION: 1. Heterogeneous enhancement involving both kidneys. Findings are suggestive for bilateral  pyelonephritis. Focal low density in the posterolateral left kidney measures roughly 8 mm and could represent focal phlegmon. No discrete renal abscesses at this time. 2. Mild urinary bladder wall thickening and likely related to cystitis based on the renal findings. Electronically Signed   By: Markus Daft M.D.   On: 04/08/2020 11:51   DG Chest Portable 1 View  Result Date: 04/08/2020 CLINICAL DATA:  Cough. EXAM: PORTABLE CHEST 1 VIEW COMPARISON:  March 15, 2020 FINDINGS: The heart size and mediastinal contours are within normal limits. Both lungs are  clear. The visualized skeletal structures are unremarkable. IMPRESSION: No active disease. Electronically Signed   By: Dorise Bullion III M.D   On: 04/08/2020 09:29    Procedures Procedures (including critical care time)  Medications Ordered in ED Medications  acetaminophen (TYLENOL) tablet 650 mg (650 mg Oral Not Given 04/08/20 0942)  cefTRIAXone (ROCEPHIN) 1 g in sodium chloride 0.9 % 100 mL IVPB (1 g Intravenous New Bag/Given 04/08/20 1229)  acetaminophen (TYLENOL) tablet 1,000 mg (1,000 mg Oral Given 04/08/20 0903)  sodium chloride 0.9 % bolus 1,000 mL (0 mLs Intravenous Stopped 04/08/20 1130)  morphine 4 MG/ML injection 4 mg (4 mg Intravenous Given 04/08/20 1013)  ondansetron (ZOFRAN) injection 4 mg (4 mg Intravenous Given 04/08/20 1013)  iohexol (OMNIPAQUE) 300 MG/ML solution 80 mL (80 mLs Intravenous Contrast Given 04/08/20 1108)  insulin aspart (novoLOG) injection 4 Units (4 Units Intravenous Given 04/08/20 1153)    ED Course  I have reviewed the triage vital signs and the nursing notes.  Pertinent labs & imaging results that were available during my care of the patient were reviewed by me and considered in my medical decision making (see chart for details).    MDM Rules/Calculators/A&P                          49 year old female who presents for evaluation of abdominal pain, nausea/vomiting that began at 4:30 a.m. yesterday morning.  She is concerned because last, she was in DKA.  She reports she has been compliant with her medications.  She has not been vaccinated for Covid.  No known Covid exposure.  Initial arrival, she is febrile but vitals otherwise stable.  On exam, she has tenderness palpation noted to the epigastric, left upper quadrant region.  Consider infectious intra-abdominal process versus hepatobiliary etiology.  Low suspicion for DKA given fever but also consideration.  Also consider Covid.  Labs ordered at triage.  I-STAT beta is slightly positive.  I suspect  this is most likely was negative as she has not had a period since 2018.  CMP shows sodium of 132, BUN of 13, creatinine 1.13.  Lipase is normal.  CBC shows no leukocytosis.  Hemoglobin is stable at 9.99.  Review of her records show that this is at baseline.  UA does show large leukocytes, pyuria, bacteria.  Question if this is pyelonephritis that is causing her symptoms.  CT on pelvis shows heterogenous enhancement involving both kidneys suggestive of bilateral pyelonephritis. There is no discrete renal abscess noted. She has some mild urinary bladder wall thickening. Given her urine as well as findings on CT scan, will plan to treat this as pyelonephritis. Patient given Rocephin. Given that she has had systemic fever, nausea/vomiting. She warrants admission.  Discussed with internal medicine team who accepts patient for admission.  Updated patient via language interpreter on plan. She is agreeable.   Alanda Colton was evaluated in Emergency Department on  04/08/2020 for the symptoms described in the history of present illness. She was evaluated in the context of the global COVID-19 pandemic, which necessitated consideration that the patient might be at risk for infection with the SARS-CoV-2 virus that causes COVID-19. Institutional protocols and algorithms that pertain to the evaluation of patients at risk for COVID-19 are in a state of rapid change based on information released by regulatory bodies including the CDC and federal and state organizations. These policies and algorithms were followed during the patient's care in the ED.  Portions of this note were generated with [employer]. Dictation errors may occur despite best attempts at proofreading.   Final Clinical Impression(s) / ED Diagnoses Final diagnoses:  Pyelonephritis    Rx / DC Orders ED Discharge Orders    None       Volanda Napoleon, PA-C 04/08/20 1354    Volanda Napoleon, PA-C 04/08/20 1405     Lucrezia Starch, MD 04/08/20 1756

## 2020-04-08 NOTE — ED Notes (Signed)
Pt resting, O2 sats 97%

## 2020-04-08 NOTE — ED Notes (Signed)
Patient transported to CT via stretcher in stable condition 

## 2020-04-08 NOTE — Progress Notes (Signed)
Patient arrived to unit. VSS. 8/10 pain in back.  Completed assessment, medications, and admission questions with the assistance of the patient's daughter as interpreter per patient request.

## 2020-04-09 DIAGNOSIS — N12 Tubulo-interstitial nephritis, not specified as acute or chronic: Secondary | ICD-10-CM | POA: Diagnosis present

## 2020-04-09 DIAGNOSIS — B961 Klebsiella pneumoniae [K. pneumoniae] as the cause of diseases classified elsewhere: Secondary | ICD-10-CM | POA: Diagnosis present

## 2020-04-09 DIAGNOSIS — E119 Type 2 diabetes mellitus without complications: Secondary | ICD-10-CM | POA: Diagnosis not present

## 2020-04-09 DIAGNOSIS — E669 Obesity, unspecified: Secondary | ICD-10-CM | POA: Diagnosis present

## 2020-04-09 DIAGNOSIS — R710 Precipitous drop in hematocrit: Secondary | ICD-10-CM | POA: Diagnosis present

## 2020-04-09 DIAGNOSIS — E11649 Type 2 diabetes mellitus with hypoglycemia without coma: Secondary | ICD-10-CM | POA: Diagnosis not present

## 2020-04-09 DIAGNOSIS — Z833 Family history of diabetes mellitus: Secondary | ICD-10-CM | POA: Diagnosis not present

## 2020-04-09 DIAGNOSIS — E1122 Type 2 diabetes mellitus with diabetic chronic kidney disease: Secondary | ICD-10-CM | POA: Diagnosis present

## 2020-04-09 DIAGNOSIS — R34 Anuria and oliguria: Secondary | ICD-10-CM | POA: Diagnosis present

## 2020-04-09 DIAGNOSIS — R81 Glycosuria: Secondary | ICD-10-CM | POA: Diagnosis present

## 2020-04-09 DIAGNOSIS — I130 Hypertensive heart and chronic kidney disease with heart failure and stage 1 through stage 4 chronic kidney disease, or unspecified chronic kidney disease: Secondary | ICD-10-CM | POA: Diagnosis present

## 2020-04-09 DIAGNOSIS — I5032 Chronic diastolic (congestive) heart failure: Secondary | ICD-10-CM | POA: Diagnosis present

## 2020-04-09 DIAGNOSIS — Z20822 Contact with and (suspected) exposure to covid-19: Secondary | ICD-10-CM | POA: Diagnosis present

## 2020-04-09 DIAGNOSIS — N10 Acute tubulo-interstitial nephritis: Secondary | ICD-10-CM | POA: Diagnosis present

## 2020-04-09 DIAGNOSIS — E1165 Type 2 diabetes mellitus with hyperglycemia: Secondary | ICD-10-CM | POA: Diagnosis present

## 2020-04-09 DIAGNOSIS — Z8249 Family history of ischemic heart disease and other diseases of the circulatory system: Secondary | ICD-10-CM | POA: Diagnosis not present

## 2020-04-09 DIAGNOSIS — Z79899 Other long term (current) drug therapy: Secondary | ICD-10-CM | POA: Diagnosis not present

## 2020-04-09 DIAGNOSIS — N179 Acute kidney failure, unspecified: Secondary | ICD-10-CM | POA: Diagnosis not present

## 2020-04-09 DIAGNOSIS — H5462 Unqualified visual loss, left eye, normal vision right eye: Secondary | ICD-10-CM | POA: Diagnosis present

## 2020-04-09 DIAGNOSIS — Z1611 Resistance to penicillins: Secondary | ICD-10-CM | POA: Diagnosis not present

## 2020-04-09 DIAGNOSIS — E785 Hyperlipidemia, unspecified: Secondary | ICD-10-CM | POA: Diagnosis present

## 2020-04-09 DIAGNOSIS — I959 Hypotension, unspecified: Secondary | ICD-10-CM | POA: Diagnosis present

## 2020-04-09 DIAGNOSIS — K5900 Constipation, unspecified: Secondary | ICD-10-CM | POA: Diagnosis not present

## 2020-04-09 DIAGNOSIS — N182 Chronic kidney disease, stage 2 (mild): Secondary | ICD-10-CM | POA: Diagnosis present

## 2020-04-09 DIAGNOSIS — Z794 Long term (current) use of insulin: Secondary | ICD-10-CM | POA: Diagnosis not present

## 2020-04-09 DIAGNOSIS — N170 Acute kidney failure with tubular necrosis: Secondary | ICD-10-CM | POA: Diagnosis present

## 2020-04-09 DIAGNOSIS — Z7901 Long term (current) use of anticoagulants: Secondary | ICD-10-CM | POA: Diagnosis not present

## 2020-04-09 LAB — BASIC METABOLIC PANEL
Anion gap: 11 (ref 5–15)
BUN: 25 mg/dL — ABNORMAL HIGH (ref 6–20)
CO2: 21 mmol/L — ABNORMAL LOW (ref 22–32)
Calcium: 8.1 mg/dL — ABNORMAL LOW (ref 8.9–10.3)
Chloride: 96 mmol/L — ABNORMAL LOW (ref 98–111)
Creatinine, Ser: 2.14 mg/dL — ABNORMAL HIGH (ref 0.44–1.00)
GFR, Estimated: 28 mL/min — ABNORMAL LOW (ref >60)
Glucose, Bld: 440 mg/dL — ABNORMAL HIGH (ref 70–99)
Potassium: 4.6 mmol/L (ref 3.5–5.1)
Sodium: 128 mmol/L — ABNORMAL LOW (ref 135–145)

## 2020-04-09 LAB — CBC
HCT: 26.3 % — ABNORMAL LOW (ref 36.0–46.0)
Hemoglobin: 8.1 g/dL — ABNORMAL LOW (ref 12.0–15.0)
MCH: 26.5 pg (ref 26.0–34.0)
MCHC: 30.8 g/dL (ref 30.0–36.0)
MCV: 85.9 fL (ref 80.0–100.0)
Platelets: 170 10*3/uL (ref 150–400)
RBC: 3.06 MIL/uL — ABNORMAL LOW (ref 3.87–5.11)
RDW: 13.3 % (ref 11.5–15.5)
WBC: 6 10*3/uL (ref 4.0–10.5)
nRBC: 0 % (ref 0.0–0.2)

## 2020-04-09 LAB — GLUCOSE, CAPILLARY
Glucose-Capillary: 386 mg/dL — ABNORMAL HIGH (ref 70–99)
Glucose-Capillary: 398 mg/dL — ABNORMAL HIGH (ref 70–99)
Glucose-Capillary: 331 mg/dL — ABNORMAL HIGH (ref 70–99)
Glucose-Capillary: 265 mg/dL — ABNORMAL HIGH (ref 70–99)
Glucose-Capillary: 241 mg/dL — ABNORMAL HIGH (ref 70–99)

## 2020-04-09 MED ORDER — INSULIN ASPART 100 UNIT/ML ~~LOC~~ SOLN
0.0000 [IU] | Freq: Three times a day (TID) | SUBCUTANEOUS | Status: DC
  Administered 2020-04-09: 12:00:00 15 [IU] via SUBCUTANEOUS
  Administered 2020-04-09: 17:00:00 11 [IU] via SUBCUTANEOUS
  Administered 2020-04-10: 12:00:00 3 [IU] via SUBCUTANEOUS
  Administered 2020-04-10: 07:00:00 7 [IU] via SUBCUTANEOUS
  Administered 2020-04-10 – 2020-04-11 (×2): 3 [IU] via SUBCUTANEOUS
  Administered 2020-04-13: 12:00:00 4 [IU] via SUBCUTANEOUS

## 2020-04-09 MED ORDER — INSULIN GLARGINE 100 UNIT/ML ~~LOC~~ SOLN: 10.0000 [IU] | SUBCUTANEOUS | Status: DC

## 2020-04-09 MED ORDER — INSULIN GLARGINE 100 UNIT/ML ~~LOC~~ SOLN
10.0000 [IU] | Freq: Every day | SUBCUTANEOUS | Status: DC
  Administered 2020-04-09: 23:00:00 10 [IU] via SUBCUTANEOUS
  Filled 2020-04-09 (×2): qty 0.1

## 2020-04-09 MED ORDER — INSULIN GLARGINE 100 UNIT/ML ~~LOC~~ SOLN
30.0000 [IU] | Freq: Every day | SUBCUTANEOUS | Status: DC
  Administered 2020-04-09: 18:00:00 30 [IU] via SUBCUTANEOUS
  Filled 2020-04-09 (×2): qty 0.3

## 2020-04-09 MED ORDER — ACETAMINOPHEN 325 MG PO TABS
650.0000 mg | ORAL_TABLET | ORAL | Status: DC | PRN
  Filled 2020-04-09: qty 2

## 2020-04-09 MED ORDER — PIPERACILLIN-TAZOBACTAM 3.375 G IVPB
3.3750 g | Freq: Three times a day (TID) | INTRAVENOUS | Status: DC
  Administered 2020-04-09 – 2020-04-10 (×4): 3.375 g via INTRAVENOUS
  Filled 2020-04-09 (×4): qty 50

## 2020-04-09 MED ORDER — OXYCODONE-ACETAMINOPHEN 5-325 MG PO TABS: 1.0000 | ORAL_TABLET | Freq: Three times a day (TID) | ORAL | Status: DC | PRN

## 2020-04-09 NOTE — Progress Notes (Signed)
Pharmacy Antibiotic Note  Jennifer [name] is a 49 y.o. female admitted on 04/07/2020 with UTI.  Pharmacy has been consulted for pip/tazo dosing.  Plan: Zosyn 3.375g IV q8h (4 hour infusion).  Height: 5\' 6"  (167.6 cm) Weight: 88.7 kg (195 lb 8.8 oz) IBW/kg (Calculated) : 59.3  Temp (24hrs), Avg:100.1 F (37.8 C), Min:98.3 F (36.8 C), Max:103.1 F (39.5 C)  Recent Labs  Lab 04/07/20 2016 04/08/20 0906 04/09/20 0329  WBC 6.1  --  6.0  CREATININE 1.13* 1.20* 2.14*    Estimated Creatinine Clearance: 36.1 mL/min (A) (by C-G formula based on SCr of 2.14 mg/dL (H)).    No Known Allergies  Antimicrobials this admission: 12/18 CTX x1   Microbiology results: 12/18 BCx: pend 12/18 UCx: pend  Thank you for allowing pharmacy to be a part of this patient's care.  Norina Buzzard, PharmD PGY1 Pharmacy Resident 04/09/2020 7:41 AM

## 2020-04-09 NOTE — Progress Notes (Signed)
   04/09/20 0528  Assess: MEWS Score  Temp (!) 103.1 F (39.5 C)  Assess: MEWS Score  MEWS Temp 2  MEWS Systolic 0  MEWS Pulse 0  MEWS RR 0  MEWS LOC 0  MEWS Score 2  MEWS Score Color Yellow  Assess: if the MEWS score is Yellow or Red  Were vital signs taken at a resting state? Yes  Focused Assessment No change from prior assessment  Early Detection of Sepsis Score *See Row Information* High  MEWS guidelines implemented *See Row Information* No, previously yellow, continue vital signs every 4 hours  Treat  MEWS Interventions Administered scheduled meds/treatments  Pain Scale 0-10  Pain Score 9  Pain Type Acute pain  Pain Location Back  Pain Intervention(s) Medication (See eMAR)  Notify: Provider  Provider Name/Title IMTS  Date Provider Notified 04/09/20  Time Provider Notified [phone]  Notification Type Page  Notification Reason Other (Comment) (fever)  Response See new orders  Date of Provider Response 04/09/20  Time of Provider Response 0600  Document  Patient Outcome Other (Comment) (monitoring vitals)  Progress note created (see row info) Yes   Patient with yellow MEWS due to TR.  Patient admitted with elevated TR and receiving IV abx.  Tylenol given.  Yellow MEWS protocol initiated.   0600-pt fever spiked quickly again, MD notified, requested telemetry monitoring. Additional dose of tylenol given.  CBG elevated and MD approved okay to give SSI early.

## 2020-04-09 NOTE — Progress Notes (Signed)
Subjective: HD#1 No acute overnight events. Jennifer [name] evaluated at bedside this AM. Patient's son at bedside, interpreted for the patient and contributed to the ongoing situation. Patient states she feels better this morning and does not have any abdominal or back pain. Denies any urinary symptoms. States she was able to eat her breakfast and able to keep it down.   Objective:  Vital signs in last 24 hours: Vitals:   04/09/20 0528 04/09/20 0639 04/09/20 0924 04/09/20 1030  BP:   104/80   Pulse:   88   Resp:   18   Temp: (!) 103.1 F (39.5 C) (!) 101.3 F (38.5 C) 99.2 F (37.3 C) 98.7 F (37.1 C)  TempSrc: Oral Oral Oral   SpO2:   97%   Weight:      Height:       CBC Latest Ref Rng & Units 04/09/2020 04/08/2020 04/07/2020  WBC 4.0 - 10.5 K/uL 6.0 - 6.1  Hemoglobin 12.0 - 15.0 g/dL 8.1(L) 11.2(L) 9.9(L)  Hematocrit 36.0 - 46.0 % 26.3(L) 33.0(L) 32.8(L)  Platelets 150 - 400 K/uL 170 - 184   BMP Latest Ref Rng & Units 04/09/2020 04/08/2020 04/07/2020  Glucose 70 - 99 mg/dL 440(H) 416(H) 369(H)  BUN 6 - 20 mg/dL 25(H) 22(H) 13  Creatinine 0.44 - 1.00 mg/dL 2.14(H) 1.20(H) 1.13(H)  BUN/Creat Ratio 9 - 23 - - -  Sodium 135 - 145 mmol/L 128(L) 131(L) 132(L)  Potassium 3.5 - 5.1 mmol/L 4.6 4.1 4.5  Chloride 98 - 111 mmol/L 96(L) 96(L) 95(L)  CO2 22 - 32 mmol/L 21(L) - 25  Calcium 8.9 - 10.3 mg/dL 8.1(L) - 9.1   General: Well developed, obese female lying comfortably in bed, NAD CV: Regular rate and rhythm, 2/6 systolic murmur at RUSB Pulmonary: CTAB, normal work of breathing Abdominal: Soft, not distended, BS+, noo abdominal tenderness, -ve flank pain Extremities: No peripheral edema Neuro: AAO*3, normal speech Psych: Normal mood and affect, normal thought content, normal judgement  Assessment/Plan:  Active Problems:   Acute pyelonephritis  Jennifer [name] is a 49 yo F with h/o poorly controlled DM, A-fib on Eliquis, HFpEF (EF 50-55%in 5/20), Hyperlipidemia,  migraines, 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   # Bilateral Pyelonephritis Denies any abdominal pain, chills, back pain today. WBC 6 this morning. Urine culture is growing gram negative rods>100,000 and susceptibilities to follow. Patient ran high fevers, shakes last night and with a thought that it might with MDR also, she received antibiotics in November for UTI, I changed it to broad spectrum Zosyn.   --D/C Rocephin 1 g daily.  --Start Zosyn 3.375 every 8h --F/U Blood culture --D/C dilaudid --Zofran 4 mg q6h PRN --Re-scan if elevated WBC's --Start Percocet 5 mg Q8h PRN  # AKI Patient's kidney functions are worsening even after receiving IV fluid  yesterday, sCr~ 2.14, BUN 25 . At this time, I am suspecting it might not be pre-renal but may be intrinsic, that's why worsening even with fluids. Will wait until tomorrow to re-check . Baseline sCR~ 0.9.   --BMP daily --Avoid nephrotoxic drugs --Encourage oral intake  #Diabetes Mellitus  -- SSI -- Resume home Lantus  # Hypertension Hypotensive due to acute illness.  -- Hold home lisinopril 10 mg daily  # Hyperlipidemia On Atorvastatin 40 mg daily  -- Resume Atorvastatin 40 mg daily  # Atrial fibrillation  On Eliquis 5 mg daily. Currently in sinus tachycardia due to acute illness. No need for rate control.   --Resume home Eliquis 5 mg daily  # HFpEF Most recent echo in 08/2018 with EF 50-55%. Prescribed Lasix 20 mg PO daily PRN for lower extremity swelling. Patient euvolemic at this time.   -- Hold home lasix  Prior to Admission Living Arrangement: Home Anticipated Discharge Location: Home Barriers to Discharge: Ongoing medical management Dispo: Anticipated discharge in approximately 2-3 day(s).   Honor Junes, MD 04/09/2020, 2:42 PM Pager: [phone] After 5pm  on weekdays and 1pm on weekends: On Call pager [phone]

## 2020-04-10 ENCOUNTER — Inpatient Hospital Stay (HOSPITAL_COMMUNITY): Payer: Medicaid Other

## 2020-04-10 DIAGNOSIS — I4891 Unspecified atrial fibrillation: Secondary | ICD-10-CM

## 2020-04-10 DIAGNOSIS — E119 Type 2 diabetes mellitus without complications: Secondary | ICD-10-CM

## 2020-04-10 DIAGNOSIS — N179 Acute kidney failure, unspecified: Secondary | ICD-10-CM

## 2020-04-10 DIAGNOSIS — N10 Acute tubulo-interstitial nephritis: Principal | ICD-10-CM

## 2020-04-10 DIAGNOSIS — Z794 Long term (current) use of insulin: Secondary | ICD-10-CM

## 2020-04-10 LAB — URINALYSIS, ROUTINE W REFLEX MICROSCOPIC
Bilirubin Urine: NEGATIVE
Glucose, UA: 50 mg/dL — AB
Ketones, ur: NEGATIVE mg/dL
Nitrite: NEGATIVE
Protein, ur: 100 mg/dL — AB
Specific Gravity, Urine: 1.027 (ref 1.005–1.030)
WBC, UA: >50 WBC/hpf — ABNORMAL HIGH (ref 0–5)
pH: 5 (ref 5.0–8.0)

## 2020-04-10 LAB — CBC
HCT: 24.9 % — ABNORMAL LOW (ref 36.0–46.0)
Hemoglobin: 7.7 g/dL — ABNORMAL LOW (ref 12.0–15.0)
MCH: 26.7 pg (ref 26.0–34.0)
MCHC: 30.9 g/dL (ref 30.0–36.0)
MCV: 86.5 fL (ref 80.0–100.0)
Platelets: 184 10*3/uL (ref 150–400)
RBC: 2.88 MIL/uL — ABNORMAL LOW (ref 3.87–5.11)
RDW: 13.5 % (ref 11.5–15.5)
WBC: 7.6 10*3/uL (ref 4.0–10.5)
nRBC: 0 % (ref 0.0–0.2)

## 2020-04-10 LAB — GLUCOSE, CAPILLARY
Glucose-Capillary: 218 mg/dL — ABNORMAL HIGH (ref 70–99)
Glucose-Capillary: 137 mg/dL — ABNORMAL HIGH (ref 70–99)
Glucose-Capillary: 131 mg/dL — ABNORMAL HIGH (ref 70–99)
Glucose-Capillary: 130 mg/dL — ABNORMAL HIGH (ref 70–99)

## 2020-04-10 LAB — PROTEIN / CREATININE RATIO, URINE
Creatinine, Urine: 132.6 mg/dL
Protein Creatinine Ratio: 1.26 mg/mg{Cre} — ABNORMAL HIGH (ref 0.00–0.15)
Total Protein, Urine: 167 mg/dL

## 2020-04-10 LAB — URINE CULTURE: Culture: >=100000 — AB

## 2020-04-10 LAB — BASIC METABOLIC PANEL
Anion gap: 14 (ref 5–15)
BUN: 33 mg/dL — ABNORMAL HIGH (ref 6–20)
CO2: 20 mmol/L — ABNORMAL LOW (ref 22–32)
Calcium: 8.5 mg/dL — ABNORMAL LOW (ref 8.9–10.3)
Chloride: 97 mmol/L — ABNORMAL LOW (ref 98–111)
Creatinine, Ser: 3.93 mg/dL — ABNORMAL HIGH (ref 0.44–1.00)
GFR, Estimated: 13 mL/min — ABNORMAL LOW (ref >60)
Glucose, Bld: 240 mg/dL — ABNORMAL HIGH (ref 70–99)
Potassium: 4.5 mmol/L (ref 3.5–5.1)
Sodium: 131 mmol/L — ABNORMAL LOW (ref 135–145)

## 2020-04-10 LAB — SODIUM, URINE, RANDOM: Sodium, Ur: 17 mmol/L

## 2020-04-10 MED ORDER — LACTATED RINGERS IV BOLUS
1000.0000 mL | Freq: Once | INTRAVENOUS | Status: AC
  Administered 2020-04-10: 10:00:00 1000 mL via INTRAVENOUS

## 2020-04-10 MED ORDER — ZINC GLUCONATE 50 MG PO TABS
50.0000 mg | ORAL_TABLET | Freq: Every day | ORAL | 3 refills | Status: DC
Start: 2020-04-10 — End: 2020-06-15

## 2020-04-10 MED ORDER — INSULIN GLARGINE 100 UNIT/ML ~~LOC~~ SOLN
40.0000 [IU] | Freq: Every day | SUBCUTANEOUS | Status: DC
  Administered 2020-04-10 – 2020-04-11 (×2): 40 [IU] via SUBCUTANEOUS
  Filled 2020-04-10 (×3): qty 0.4

## 2020-04-10 MED ORDER — POLYETHYLENE GLYCOL 3350 17 G PO PACK
17.0000 g | PACK | Freq: Every day | ORAL | Status: DC
  Administered 2020-04-10 – 2020-04-12 (×3): 17 g via ORAL
  Filled 2020-04-10 (×4): qty 1

## 2020-04-10 MED ORDER — SODIUM CHLORIDE 0.9 % IV SOLN
3.0000 g | Freq: Two times a day (BID) | INTRAVENOUS | Status: DC
  Administered 2020-04-10 – 2020-04-12 (×4): 3 g via INTRAVENOUS
  Filled 2020-04-10: qty 3
  Filled 2020-04-10: qty 8
  Filled 2020-04-10: qty 3
  Filled 2020-04-10 (×2): qty 8

## 2020-04-10 MED ORDER — LACTATED RINGERS IV SOLN: INTRAVENOUS | Status: AC

## 2020-04-10 NOTE — Progress Notes (Signed)
Subjective:   Jennifer [name] states she is having bilateral abdominal pain on the flanks, in addition to nausea. She hasn't had a bowel movement in 4 days either.   Explained to patient and her family that her kidneys are failing and that more tests will be done to understand why. She has no questions at this time.   Arabic Interpreter was used along with family to obtain history.  Objective:  Vital signs in last 24 hours: Vitals:   04/09/20 1757 04/09/20 1929 04/10/20 0102 04/10/20 0525  BP: 139/61 130/64 (!) 117/56 139/64  Pulse: 89 86 80 84  Resp: 15 18 18 16   Temp: 98.9 F (37.2 C) 98.4 F (36.9 C) 98.9 F (37.2 C) 98.7 F (37.1 C)  TempSrc: Oral Oral Oral Oral  SpO2: 95% 91% 91% 93%  Weight:      Height:       Physical Exam Vitals and nursing note reviewed.  Constitutional:      General: She is not in acute distress.    Appearance: She is obese.  HENT:     Head: Normocephalic and atraumatic.     Mouth/Throat:     Mouth: Mucous membranes are moist.     Pharynx: Oropharynx is clear.  Cardiovascular:     Rate and Rhythm: Normal rate and regular rhythm.     Heart sounds: No murmur heard.   Abdominal:     General: Bowel sounds are normal.     Palpations: Abdomen is soft.     Tenderness: There is right CVA tenderness and left CVA tenderness.  Skin:    General: Skin is warm and dry.  Neurological:     General: No focal deficit present.     Mental Status: She is alert and oriented to person, place, and time.  Psychiatric:        Mood and Affect: Mood normal.        Behavior: Behavior normal.    Assessment/Plan:  Active Problems:   Acute pyelonephritis   Pyelonephritis  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   # Bilateral Klebsiella Pyelonephritis Urine cultures growing Klebsiella Pneumoniae that is resistant to Ampicillin and Macrobid. Initially on Ceftriaxone that was broadened to Cefepime due to risk for MDR pathogens. Given that susceptibilities have returned, antibiotics have been narrowed to Unasyn per pharmacy recommendations. Blood cultures have remained negative. Given severity of infection, will plan for a longer course of treatment. Patient is at high risk of developing renal abscess due to uncontrolled T2DM, so will have low threshold for re-imaging.   --Unasyn 3g q12h   --Zofran 4 mg q6h PRN --Percocet 5 mg Q8h PRN  # AKI Patient's kidney function continued to worsen over last 24 hours to creatinine of 3.93 with BUN of 33. Given BUN/Cr ratio and normotension, likely intrinsic or post-renal. Approximately 700 cc of urine out in the past 12 hours, 1.5 cc/kg/hr, so no evidence of oliguria. Renal ultrasound obtained and negative for hydronephrosis, so obstructive uropathy ruled out. Per literature search, pyelonephritis is a very rare cause of severe renal failure. Will go ahead and obtain urine studies and consult nephrology for additional recommendations.    --Urine sodium, creatinine, protein and P/C ratio pending --Nephrology consultation today --  BMP daily --Monitor urine output  --Avoid nephrotoxic drugs --Encourage oral intake  #Diabetes Mellitus --SSI --Lantus 40 units at bedtime   # Hypertension Normotensive now.   --Hold home lisinopril 10 mg daily  # Atrial fibrillation On Eliquis 5 mg daily. Currently in sinus tachycardia due to acute illness. No need for rate control. Due to drop in hemoglobin, will hold at this time until acute blood loss can be ruled out.   --Hold home Eliquis 5 mg daily  # HFpEF Most recent echo in 08/2018 with EF 50-55%. Prescribed Lasix 20 mg PO daily PRN for lower extremity swelling. Patient euvolemic at this  time.   -- Holding home lasix  Prior to Admission Living Arrangement: Home Anticipated Discharge Location: Home Barriers to Discharge: Ongoing medical management Dispo: Anticipated discharge in approximately 2-3 day(s).   Dr. Jose Persia Internal Medicine PGY-2  Pager: [phone] After 5pm on weekdays and 1pm on weekends: On Call pager [phone]  04/10/2020, 6:49 AM

## 2020-04-10 NOTE — Consult Note (Signed)
Reason for Consult: AKI Referring Physician:  Philipp Ovens, MD  Jennifer [name] is an 49 y.o. 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  In the ED she was noted to be febrile, labs were notable for glucose of 416, Na 131, BUN 22, Cr 1.2, UA with large leukocytes, few bacteria.  CT of abdomen and pelvis with IV contrast revealed bilateral pyelonephritis.  She was admitted for IV fluids and antibiotics.  Initial urine culture revealed GNR.  We were consulted after she developed progressive, oliguric, AKI.  The trend in Scr is seen below.    Of note, she has had proteinuria prior to admission and an episode of AKI last month with a similar presentation.  She also had been on lisinopril and metformin prior to admission and received a dose of Ibuprofen on 04/08/20.        Trend in Creatinine: Creatinine, Ser  Date/Time Value Ref Range Status  04/10/2020 04:32 AM 3.93 (H) 0.44 - 1.00 mg/dL Final  04/09/2020 03:29 AM 2.14 (H) 0.44 - 1.00 mg/dL Final  04/08/2020 09:06 AM 1.20 (H) 0.44 - 1.00 mg/dL Final  04/07/2020 08:16 PM 1.13 (H) 0.44 - 1.00 mg/dL Final  03/27/2020 02:43 PM 0.92 0.57 - 1.00 mg/dL Final  03/17/2020 04:21 AM 0.82 0.44 - 1.00 mg/dL Final  03/16/2020 01:54 AM 0.86 0.44 - 1.00 mg/dL Final  03/15/2020 07:55 AM 1.27 (H) 0.44 - 1.00 mg/dL Final  03/14/2020 04:38 PM 1.19 (H) 0.44 - 1.00 mg/dL Final  03/14/2020 11:27 AM 1.28 (H) 0.44 - 1.00 mg/dL Final  03/14/2020 08:01 AM 1.60 (H) 0.44 - 1.00 mg/dL Final  03/14/2020 03:37 AM 1.93 (H) 0.44 - 1.00 mg/dL Final  03/14/2020 01:17 AM 2.15 (H) 0.44 - 1.00 mg/dL Final  03/13/2020 04:47 PM 1.94 (H) 0.44 - 1.00 mg/dL Final  03/10/2020 02:48 PM 1.27 (H) 0.44 - 1.00 mg/dL Final  03/08/2020 12:27 PM 1.00 0.44 - 1.00 mg/dL Final   01/25/2020 11:20 AM 0.88 0.44 - 1.00 mg/dL Final  03/03/2019 03:33 PM 0.90 0.57 - 1.00 mg/dL Final  01/20/2019 11:48 AM 1.03 (H) 0.57 - 1.00 mg/dL Final  09/19/2018 09:30 PM 0.66 0.44 - 1.00 mg/dL Final  08/22/2018 08:57 PM 0.76 0.44 - 1.00 mg/dL Final  06/12/2018 11:46 AM 0.57 0.44 - 1.00 mg/dL Final  01/30/2018 02:40 PM 0.61 0.57 - 1.00 mg/dL Final  10/13/2017 04:56 PM 0.56 0.44 - 1.00 mg/dL Final  04/23/2017 05:37 AM 0.62 0.44 - 1.00 mg/dL Final  02/21/2017 02:13 PM 0.68 0.57 - 1.00 mg/dL Final  12/31/2015 08:10 AM 0.50 0.44 - 1.00 mg/dL Final  10/17/2015 12:06 AM 0.82 0.44 - 1.00 mg/dL Final  08/10/2015 03:44 AM 0.87 0.44 - 1.00 mg/dL Final  03/23/2015 09:10 AM 0.46 0.44 - 1.00 mg/dL Final  05/21/2014 04:38 AM 0.50 0.50 - 1.10 mg/dL Final  03/28/2013 06:56 AM 0.46 (L) 0.50 - 1.10 mg/dL Final  07/18/2009 06:56 PM 0.58 (0.40-1.20 mg/dL Final  04/13/2009 10:13 AM 0.60 (0.40-1.20 mg/dL Final  02/21/2009 03:12 PM 0.5 0.4 - 1.2 mg/dL Final  02/21/2009 03:11 PM 0.55 0.4 - 1.2 mg/dL Final  10/03/2008 09:00 PM 0.57 0.40 - 1.20 mg/dL Final  02/10/2008 08:08 PM 0.46 0.40 - 1.20 mg/dL Final  02/01/2008 09:44 PM 0.65 0.40 - 1.20 mg/dL Final  07/28/2007 09:02 PM 0.59 0.40 -  1.20 mg/dL Final  02/05/2007 09:12 PM 0.54 0.40 - 1.20 mg/dL Final  01/28/2007 10:56 PM 0.6  Final  04/08/2006 10:57 PM 0.53 0.40 - 1.20 mg/dL Final    PMH:   Past Medical History:  Diagnosis Date  . Anemia, iron deficiency   . Atrial fibrillation ([place])   . Blindness of left eye   . Decreased visual acuity    Left eye  . 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 Hair loss   . History of fracture of clavicle 05/18/2015  . Hyperlipidemia   . Iron deficiency anemia 05/13/2013  . Pain, dental 08/19/2018   Tooth pain/facial swelling: has poor dentition at baseline, history of dental abscess.   She has not seen a dentist in about one year.  Dentist is on [address].  No fevers chills or systemic symptoms.  Diabetes has been poorly controlled for some time.  She said it has improved recently averaging around 140.  Called the dentist said they would not see patients until May 14th but the urgency o  . Pap smear abnormality of cervix with LGSIL   . Routine/ritual circumcision   . Type II diabetes mellitus ([place])   . Uveitis     PSH:   Past Surgical History:  Procedure Laterality Date  . CESAREAN SECTION      Allergies: No Known Allergies  Medications:   Prior to Admission medications   Medication Sig Start Date End Date Taking? Authorizing Provider  acetaminophen (TYLENOL) 500 MG tablet Take 500 mg by mouth every 6 (six) hours as needed for mild pain.   Yes [provider]  apixaban (ELIQUIS) 5 MG TABS tablet Take 1 tablet (5 mg total) by mouth 2 (two) times daily. 03/27/20 06/25/20 Yes Agyei, Caprice Kluver, MD  atorvastatin (LIPITOR) 40 MG tablet Take 1 tablet (40 mg total) by mouth daily. 06/30/19  Yes Jose Persia, MD  brimonidine (ALPHAGAN) 0.2 % ophthalmic solution Place 1 drop into the left eye 2 (two) times daily. 02/01/20  Yes [provider]  carboxymethylcellul-glycerin (OPTIVE) 0.5-0.9 % ophthalmic solution Place 1 drop into both eyes 2 (two) times daily as needed for dry eyes.   Yes [provider]  cephALEXin (KEFLEX) 500 MG capsule Take 500 mg by mouth every 6 (six) hours.   Yes [provider]  dorzolamide-timolol (COSOPT) 22.3-6.8 MG/ML ophthalmic solution Place 1 drop into the left eye 2 (two) times daily.   Yes [provider]  Dulaglutide (TRULICITY) 1.5 DI/7.1EZ SOPN Inject 1.5 mg into the skin once a week. 03/27/20 03/27/21 Yes Agyei, Caprice Kluver, MD  DULoxetine (CYMBALTA) 60 MG capsule Take 60 mg by mouth daily.   Yes [provider]  ferrous sulfate 325 (65 FE) MG tablet Take 1 tablet (325 mg total) by mouth every  Monday, Wednesday, and Friday for 14 days. 03/20/20 04/03/20 Yes Gaylan Gerold, DO  furosemide (LASIX) 20 MG tablet Take 1 tablet (20 mg total) by mouth daily as needed (leg swelling). 06/07/19  Yes Jose Persia, MD  insulin aspart (NOVOLOG) 100 UNIT/ML injection Inject 10 Units into the skin 3 (three) times daily before meals.   Yes [provider]  insulin glargine (LANTUS) 100 UNIT/ML injection Inject 10-30 Units into the skin See admin instructions. Inject 30 units subcutaneously every morning and 10 units at night   Yes [provider]  lisinopril (  ZESTRIL) 10 MG tablet Take 1 tablet (10 mg total) by mouth daily. 06/30/19  Yes Jose Persia, MD  metFORMIN (GLUCOPHAGE) 1000 MG tablet Take 1 tablet (1,000 mg total) by mouth 2 (two) times daily with a meal. 10/13/19  Yes Jose Persia, MD  Multiple Vitamins-Minerals (MULTIVITAMIN WITH MINERALS) tablet Take 1 tablet by mouth daily.   Yes [provider]  Accu-Chek FastClix Lancets MISC Check blood sugar 4 times a day 06/07/19   Jose Persia, MD  Blood Glucose Monitoring Suppl (ACCU-CHEK GUIDE) w/Device KIT 1 each by Does not apply route 4 (four) times daily. 11/19/18   Jose Persia, MD  glucose blood (ACCU-CHEK GUIDE) test strip Check blood sugar 4 times per day 06/07/19   Jose Persia, MD  insulin NPH Human (NOVOLIN N) 100 UNIT/ML injection Inject 0.35-0.45 mLs (35-45 Units total) into the skin See admin instructions. 30 units in the morning, 20 units in the evening Patient not taking: No sig reported 03/18/20   Gaylan Gerold, DO  Insulin Pen Needle 31G X 5 MM MISC 1 Dose by Does not apply route 2 (two) times daily. 05/20/18   Alphonzo Grieve, MD  zinc gluconate 50 MG tablet Take 1 tablet (50 mg total) by mouth daily. 04/10/20   Jean Rosenthal, MD    Inpatient medications: . acetaminophen  650 mg Oral Once  . atorvastatin  40 mg Oral Daily  . brimonidine  1 drop Left Eye BID  . insulin aspart  0-20 Units  Subcutaneous TID WC  . insulin glargine  40 Units Subcutaneous Daily  . polyethylene glycol  17 g Oral Daily  . sodium chloride flush  3 mL Intravenous Q12H    Discontinued Meds:   Medications Discontinued During This Encounter  Medication Reason  . enoxaparin (LOVENOX) injection 40 mg   . ketorolac (ACULAR) 0.5 % ophthalmic solution Completed Course  . acetaminophen (TYLENOL) tablet 650 mg   . acetaminophen (TYLENOL) tablet 650 mg   . cefTRIAXone (ROCEPHIN) 1 g in sodium chloride 0.9 % 100 mL IVPB   . insulin aspart (novoLOG) injection 0-15 Units   . HYDROmorphone (DILAUDID) injection 0.5 mg   . insulin glargine (LANTUS) injection 10-30 Units   . insulin glargine (LANTUS) injection 10 Units   . insulin glargine (LANTUS) injection 30 Units   . piperacillin-tazobactam (ZOSYN) IVPB 3.375 g Change in therapy  . apixaban (ELIQUIS) tablet 5 mg Completed Course  . DULoxetine (CYMBALTA) DR capsule 60 mg     Social History:  reports that she has never smoked. She has never used smokeless tobacco. She reports that she does not drink alcohol and does not use drugs.  Family History:   Family History  Problem Relation Age of Onset  . Diabetes Father     Pertinent items are noted in HPI. Weight change: 0.68 kg  Intake/Output Summary (Last 24 hours) at 04/10/2020 1654 Last data filed at 04/10/2020 1200 Gross per 24 hour  Intake 1441.67 ml  Output 300 ml  Net 1141.67 ml   BP (!) 145/57 (BP Location: Left Arm)   Pulse 82   Temp 98.5 F (36.9 C) (Oral)   Resp 20   Ht _0  (1.676 m)   Wt 87.8 kg   LMP 08/21/2016 (Exact Date)   SpO2 100%   BMI 31.23 kg/m  Vitals:   04/10/20 0650 04/10/20 0839 04/10/20 0845 04/10/20 1424  BP:  131/67 131/67 (!) 145/57  Pulse:   82   Resp:  20 18 20  Temp:   98.2 F (36.8 C) 98.5 F (36.9 C)  TempSrc:   Oral Oral  SpO2:  100% 100% 100%  Weight: 87.8 kg     Height:         General appearance: cooperative, fatigued and no distress Head:  Normocephalic, without obvious abnormality, atraumatic Eyes: slerotic left eye Resp: clear to auscultation bilaterally Cardio: irregularly irregular rhythm and no rub GI: soft, non-tender; bowel sounds normal; no masses,  no organomegaly Extremities: edema trace pretibial edema bilateral lower extremities  Labs: Basic Metabolic Panel: Recent Labs  Lab 04/07/20 2016 04/08/20 0906 04/09/20 0329 04/10/20 0432  NA 132* 131* 128* 131*  K 4.5 4.1 4.6 4.5  CL 95* 96* 96* 97*  CO2 25  --  21* 20*  GLUCOSE 369* 416* 440* 240*  BUN 13 22* 25* 33*  CREATININE 1.13* 1.20* 2.14* 3.93*  ALBUMIN 2.8*  --   --   --   CALCIUM 9.1  --  8.1* 8.5*   Liver Function Tests: Recent Labs  Lab 04/07/20 2016  AST 30  ALT 27  ALKPHOS 127*  BILITOT 1.1  PROT 7.5  ALBUMIN 2.8*   Recent Labs  Lab 04/07/20 2016  LIPASE 18   No results for input(s): AMMONIA in the last 168 hours. CBC: Recent Labs  Lab 04/07/20 2016 04/08/20 0906 04/09/20 0329 04/10/20 0432  WBC 6.1  --  6.0 7.6  HGB 9.9* 11.2* 8.1* 7.7*  HCT 32.8* 33.0* 26.3* 24.9*  MCV 86.1  --  85.9 86.5  PLT 184  --  170 184   PT/INR: _0 (inr:5) Cardiac Enzymes: )No results for input(s): CKTOTAL, CKMB, CKMBINDEX, TROPONINI in the last 168 hours. CBG: Recent Labs  Lab 04/09/20 1546 04/09/20 2114 04/10/20 0610 04/10/20 1155 04/10/20 1636  GLUCAP 265* 241* 218* 137* 131*    Iron Studies: No results for input(s): IRON, TIBC, TRANSFERRIN, FERRITIN in the last 168 hours.  Xrays/Other Studies: US RENAL  Result Date: 04/10/2020 CLINICAL DATA:  Acute pyelonephritis EXAM: RENAL / URINARY TRACT ULTRASOUND COMPLETE COMPARISON:  Abdomen and pelvis CT from 2 days ago FINDINGS: Right Kidney: Renal measurements: 12 x 5 x 6 cm = volume: 220 mL. Echogenicity within normal limits. No mass or hydronephrosis visualized. Left Kidney: Renal measurements: 11 x 5 x 5 cm = volume: 150 mL. Echogenicity within normal limits. No mass or  hydronephrosis visualized. Bladder: Limited evaluation given complete collapse. IMPRESSION: Negative renal ultrasound.  No hydronephrosis or visible abscess. Electronically Signed   By: Monte Fantasia M.D.   On: 04/10/2020 10:52     Assessment/Plan: 1.  AKI, oliguric- due to multiple renal insults.  Likely with ischemic ATN in setting of N/V and poor po intake with concomitant ACE inhibition, relative hypotension in setting of acute pyelonephritis, and further complicated by IV contrast and NSAID use during this hospitalization. 1. She has a low FeNa and agree with IVF's. 2. Hold lisinopril and metformin 3. Stop ibuprofen.  4. Continue to follow UOP and daily Scr 5. Avoid nephrotoxic agents such as NSAIDs, Cox-II I's, IV contrast, and phosphate containing enemas 6. No urgent indication for dialysis at this time.  Hopefully with IVF's and time her renal function will improve 2. Acute bilateral pyelonephritis due to Klebsiella UTI- on Unasyn per primary svc. 3. DM- on SSI per primary service.  Off of metformin due to AKI 4. HTN- stable.  Hold lisinopril for now and follow. 5. Atrial fib on Eliquis 6. HLD- on statin 7. Diastolic CHF-  mild volume overload.  Hold lasix for now and follow  8. 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     Governor Rooks Caison Hearn 04/10/2020, 4:54 PM

## 2020-04-10 NOTE — Progress Notes (Signed)
Patient called for bathroom assistance. Instructed patient how to void in order to collect urin sample in collection hat however patient still voided directly in the toiled. Will try again next time.

## 2020-04-11 LAB — CBC WITH DIFFERENTIAL/PLATELET
Abs Immature Granulocytes: 0.03 10*3/uL (ref 0.00–0.07)
Basophils Absolute: 0 10*3/uL (ref 0.0–0.1)
Basophils Relative: 0 %
Eosinophils Absolute: 0.4 10*3/uL (ref 0.0–0.5)
Eosinophils Relative: 5 %
HCT: 23.2 % — ABNORMAL LOW (ref 36.0–46.0)
Hemoglobin: 7.5 g/dL — ABNORMAL LOW (ref 12.0–15.0)
Immature Granulocytes: 0 %
Lymphocytes Relative: 15 %
Lymphs Abs: 1.1 10*3/uL (ref 0.7–4.0)
MCH: 27 pg (ref 26.0–34.0)
MCHC: 32.3 g/dL (ref 30.0–36.0)
MCV: 83.5 fL (ref 80.0–100.0)
Monocytes Absolute: 0.6 10*3/uL (ref 0.1–1.0)
Monocytes Relative: 7 %
Neutro Abs: 5.3 10*3/uL (ref 1.7–7.7)
Neutrophils Relative %: 73 %
Platelets: 200 10*3/uL (ref 150–400)
RBC: 2.78 MIL/uL — ABNORMAL LOW (ref 3.87–5.11)
RDW: 13.7 % (ref 11.5–15.5)
WBC: 7.4 10*3/uL (ref 4.0–10.5)
nRBC: 0 % (ref 0.0–0.2)

## 2020-04-11 LAB — GLUCOSE, CAPILLARY
Glucose-Capillary: 97 mg/dL (ref 70–99)
Glucose-Capillary: 146 mg/dL — ABNORMAL HIGH (ref 70–99)
Glucose-Capillary: 62 mg/dL — ABNORMAL LOW (ref 70–99)
Glucose-Capillary: 83 mg/dL (ref 70–99)
Glucose-Capillary: 69 mg/dL — ABNORMAL LOW (ref 70–99)
Glucose-Capillary: 80 mg/dL (ref 70–99)

## 2020-04-11 LAB — BASIC METABOLIC PANEL
Anion gap: 12 (ref 5–15)
BUN: 35 mg/dL — ABNORMAL HIGH (ref 6–20)
CO2: 23 mmol/L (ref 22–32)
Calcium: 8.7 mg/dL — ABNORMAL LOW (ref 8.9–10.3)
Chloride: 100 mmol/L (ref 98–111)
Creatinine, Ser: 4.51 mg/dL — ABNORMAL HIGH (ref 0.44–1.00)
GFR, Estimated: 11 mL/min — ABNORMAL LOW (ref >60)
Glucose, Bld: 110 mg/dL — ABNORMAL HIGH (ref 70–99)
Potassium: 4.1 mmol/L (ref 3.5–5.1)
Sodium: 135 mmol/L (ref 135–145)

## 2020-04-11 MED ORDER — LACTATED RINGERS IV SOLN: INTRAVENOUS | Status: AC

## 2020-04-11 NOTE — Progress Notes (Signed)
Hypoglycemic Event  CBG: 69  Treatment: juice   Symptoms: none  Follow-up CBG Time: 2215 CBG Result:80  Possible Reasons for Event: poor oral intake-doesn't like food  Comments/MD notified: protocol followed  After repeat CBG, pt given additional snack.  Patient instructed to notify if symptomatic using son as interpreter. Son stated he may go get her additional food that patient will eat.     [place]

## 2020-04-11 NOTE — Progress Notes (Signed)
Patient ID: Yoanna Jurczyk, female   DOB: [birth year], 49 y.o.   MRN: [id number] S: Feels better today and urinated several times already but her daughter reports that she did have some N/V this morning. O:BP (!) 157/82 (BP Location: Left Arm)    Pulse 85    Temp 98.7 F (37.1 C) (Oral)    Resp 14    Ht 5\' 6"  (1.676 m)    Wt 89.6 kg    LMP 08/21/2016 (Exact Date)    SpO2 98%    BMI 31.88 kg/m   Intake/Output Summary (Last 24 hours) at 04/11/2020 1151 Last data filed at 04/11/2020 1123 Gross per 24 hour  Intake 1351.67 ml  Output 300 ml  Net 1051.67 ml   Intake/Output: I/O last 3 completed shifts: In: 1641.7 [P.O.:410; I.V.:31.7; IV Piggyback:1200] Out: 300 [Urine:300]  Intake/Output this shift:  Total I/O In: -  Out: 200 [Urine:200] Weight change: 1.814 kg Gen: NAD CVS: RRR Resp: CTA Abd: +BS, soft, NT/ND Ext: trace pretibial edema  Recent Labs  Lab 04/07/20 2016 04/08/20 0906 04/09/20 0329 04/10/20 0432 04/11/20 0314  NA 132* 131* 128* 131* 135  K 4.5 4.1 4.6 4.5 4.1  CL 95* 96* 96* 97* 100  CO2 25  --  21* 20* 23  GLUCOSE 369* 416* 440* 240* 110*  BUN 13 22* 25* 33* 35*  CREATININE 1.13* 1.20* 2.14* 3.93* 4.51*  ALBUMIN 2.8*  --   --   --   --   CALCIUM 9.1  --  8.1* 8.5* 8.7*  AST 30  --   --   --   --   ALT 27  --   --   --   --    Liver Function Tests: Recent Labs  Lab 04/07/20 2016  AST 30  ALT 27  ALKPHOS 127*  BILITOT 1.1  PROT 7.5  ALBUMIN 2.8*   Recent Labs  Lab 04/07/20 2016  LIPASE 18   No results for input(s): AMMONIA in the last 168 hours. CBC: Recent Labs  Lab 04/07/20 2016 04/08/20 0906 04/09/20 0329 04/10/20 0432 04/11/20 0314  WBC 6.1  --  6.0 7.6 7.4  NEUTROABS  --   --   --   --  5.3  HGB 9.9*   < > 8.1* 7.7* 7.5*  HCT 32.8*   < > 26.3* 24.9* 23.2*  MCV 86.1  --  85.9 86.5 83.5  PLT 184  --  170 184 200   < > = values in this interval not displayed.   Cardiac Enzymes: No results for input(s): CKTOTAL, CKMB, CKMBINDEX,  TROPONINI in the last 168 hours. CBG: Recent Labs  Lab 04/10/20 1155 04/10/20 1636 04/10/20 2133 04/11/20 0540 04/11/20 1144  GLUCAP 137* 131* 130* 97 146*    Iron Studies: No results for input(s): IRON, TIBC, TRANSFERRIN, FERRITIN in the last 72 hours. Studies/Results: US RENAL  Result Date: 04/10/2020 CLINICAL DATA:  Acute pyelonephritis EXAM: RENAL / URINARY TRACT ULTRASOUND COMPLETE COMPARISON:  Abdomen and pelvis CT from 2 days ago FINDINGS: Right Kidney: Renal measurements: 12 x 5 x 6 cm = volume: 220 mL. Echogenicity within normal limits. No mass or hydronephrosis visualized. Left Kidney: Renal measurements: 11 x 5 x 5 cm = volume: 150 mL. Echogenicity within normal limits. No mass or hydronephrosis visualized. Bladder: Limited evaluation given complete collapse. IMPRESSION: Negative renal ultrasound.  No hydronephrosis or visible abscess. Electronically Signed   By: Monte Fantasia M.D.   On: 04/10/2020 10:52  acetaminophen  650 mg Oral Once   atorvastatin  40 mg Oral Daily   brimonidine  1 drop Left Eye BID   insulin aspart  0-20 Units Subcutaneous TID WC   insulin glargine  40 Units Subcutaneous Daily   polyethylene glycol  17 g Oral Daily   sodium chloride flush  3 mL Intravenous Q12H    BMET    Component Value Date/Time   NA 135 04/11/2020 0314   NA 142 03/27/2020 1443   K 4.1 04/11/2020 0314   CL 100 04/11/2020 0314   CO2 23 04/11/2020 0314   GLUCOSE 110 (H) 04/11/2020 0314   BUN 35 (H) 04/11/2020 0314   BUN 14 03/27/2020 1443   CREATININE 4.51 (H) 04/11/2020 0314   CREATININE 0.63 10/28/2014 1649   CALCIUM 8.7 (L) 04/11/2020 0314   GFRNONAA 11 (L) 04/11/2020 0314   GFRNONAA >89 10/28/2014 1649   GFRAA 85 03/27/2020 1443   GFRAA >89 10/28/2014 1649   CBC    Component Value Date/Time   WBC 7.4 04/11/2020 0314   RBC 2.78 (L) 04/11/2020 0314   HGB 7.5 (L) 04/11/2020 0314   HGB 9.6 (L) 03/27/2020 1443   HCT 23.2 (L) 04/11/2020 0314   HCT 30.3  (L) 03/27/2020 1443   PLT 200 04/11/2020 0314   PLT 241 03/27/2020 1443   MCV 83.5 04/11/2020 0314   MCV 85 03/27/2020 1443   MCH 27.0 04/11/2020 0314   MCHC 32.3 04/11/2020 0314   RDW 13.7 04/11/2020 0314   RDW 12.3 03/27/2020 1443   LYMPHSABS 1.1 04/11/2020 0314   MONOABS 0.6 04/11/2020 0314   EOSABS 0.4 04/11/2020 0314   BASOSABS 0.0 04/11/2020 0314      Assessment/Plan: 1.  AKI, oliguric- due to multiple renal insults.  Likely with ischemic ATN in setting of N/V and poor po intake with concomitant ACE inhibition, relative hypotension in setting of acute pyelonephritis, and further complicated by IV contrast and NSAID use during this hospitalization. 1. She had a low FeNa and agree with IVF's. 2. UOP starting to increase 3. Hold lisinopril and metformin 4. Stop ibuprofen.  5. Rate of rise in Scr has slowed and hopefully is reaching a plateau. 6. Continue to follow UOP and daily Scr 7. Avoid nephrotoxic agents such as NSAIDs, Cox-II I's, IV contrast, and phosphate containing enemas 8. No urgent indication for dialysis at this time.  2. Acute bilateral pyelonephritis due to Klebsiella UTI- on Unasyn per primary svc. 3. DM- on SSI per primary service.  Off of metformin due to AKI 4. HTN- stable.  Hold lisinopril for now and follow. 5. Atrial fib on Eliquis 6. HLD- on statin 7. Diastolic CHF- mild volume overload.  Hold lasix for now and follow  8. 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    Donetta Potts, MD [employer] [phone]

## 2020-04-11 NOTE — Progress Notes (Addendum)
Subjective: HD#3  No acute overnight events. Jennifer [name] evaluated at bedside this AM. Her daughter states that she vomited once this morning. Patient denies CP, SOB. She did urinate overnight and three times this morning, although did not collect her urine. She had a bowel movement last night. Her daughter was unaware of the severity of her kidney injury. Dialysis was discussed as a worse-case scenario with the daughter in case this were to be needed in the future.  Daughter at bedside was used as an Veterinary surgeon.   Objective:  Vital signs in last 24 hours: Vitals:   04/10/20 1424 04/10/20 2035 04/11/20 0527 04/11/20 0543  BP: (!) 145/57 136/79 (!) 157/82   Pulse:  83 85   Resp: 20 20 14    Temp: 98.5 F (36.9 C) 98.6 F (37 C) 98.7 F (37.1 C)   TempSrc: Oral Oral Oral   SpO2: 100% 93% 98%   Weight:    89.6 kg  Height:       CBC Latest Ref Rng & Units 04/11/2020 04/10/2020 04/09/2020  WBC 4.0 - 10.5 K/uL 7.4 7.6 6.0  Hemoglobin 12.0 - 15.0 g/dL 7.5(L) 7.7(L) 8.1(L)  Hematocrit 36.0 - 46.0 % 23.2(L) 24.9(L) 26.3(L)  Platelets 150 - 400 K/uL 200 184 170   BMP Latest Ref Rng & Units 04/11/2020 04/10/2020 04/09/2020  Glucose 70 - 99 mg/dL 110(H) 240(H) 440(H)  BUN 6 - 20 mg/dL 35(H) 33(H) 25(H)  Creatinine 0.44 - 1.00 mg/dL 4.51(H) 3.93(H) 2.14(H)  BUN/Creat Ratio 9 - 23 - - -  Sodium 135 - 145 mmol/L 135 131(L) 128(L)  Potassium 3.5 - 5.1 mmol/L 4.1 4.5 4.6  Chloride 98 - 111 mmol/L 100 97(L) 96(L)  CO2 22 - 32 mmol/L 23 20(L) 21(L)  Calcium 8.9 - 10.3 mg/dL 8.7(L) 8.5(L) 8.1(L)   Physical exam: General: Well developed, obese female sitting comfortably in bed, NAD CV: Normal rate and Rhythm, systolic murmur Pulmonary: CTAB Abdomen; soft, no guarding, no rigidity, no CVA tenderness, BS+ Extremities: 2+ peripheral edema Neuro: AAO*3 Psych: Normal mood and affect  Assessment/Plan:  Active Problems:   AKI (acute kidney injury) ([place])   Acute pyelonephritis    Pyelonephritis  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 Renal ultrasound yesterday does not show any hydronephrosis or visible abscess. Blood culture remains negative.    --Unasyn 3g q12h  Day #4 --Zofran 4 mg q6h PRN --Percocet 5 mg Q8h PRN   # AKI Patient's kidney functions continue to worsens, sCr~ 4.51 from 3.93 yesterday and 1.13 on admission. I&Os not accurate but patient reports good UOP . Plan for Foley catheter today for strict I&Os. Renal ultrasound obtained and negative for hydronephrosis, so obstructive uropathy ruled out. Nephrology was consulted yesterday and appreciate their assistance. They are considering likely ischemic ATN due to N/V and poor PO intake with concomitant ACE use, IV contrast and NSAID use. Rate of sCr has slowed and hoping to reach a plateau.  --Appreciate Nephrology assistance --IV LR 153ml/hr x 8 hours today --BMP daily --Monitor urine output  --Avoid nephrotoxic drugs --Encourage oral intake   #Diabetes Mellitus --SSI --Lantus 40 units at bedtime    # Hypertension Normotensive now.    --Hold home lisinopril  10 mg daily   # Atrial fibrillation On Eliquis 5 mg daily. Currently in sinus tachycardia due to acute illness. No need for rate control. Due to drop in hemoglobin, will hold at this time until acute blood loss can be ruled out.    --Hold home Eliquis 5 mg daily   # HFpEF Most recent echo in 08/2018 with EF 50-55%. Prescribed Lasix 20 mg PO daily PRN for lower extremity swelling. Patient euvolemic at this time.    -- Holding home lasix  Prior to Admission Living Arrangement: Home Anticipated Discharge Location:  Home Barriers to Discharge: Ongoing medical management Dispo: Anticipated discharge in approximately 2-3 day(s).   Dr. Meredith Staggers Harjot Dibello PGY-1 Pager: [phone] After 5pm on weekdays and 1pm on weekends: On Call pager [phone]  04/11/2020, 12:06 PM

## 2020-04-12 LAB — GLUCOSE, CAPILLARY
Glucose-Capillary: 50 mg/dL — ABNORMAL LOW (ref 70–99)
Glucose-Capillary: 95 mg/dL (ref 70–99)
Glucose-Capillary: 77 mg/dL (ref 70–99)
Glucose-Capillary: 80 mg/dL (ref 70–99)
Glucose-Capillary: 89 mg/dL (ref 70–99)

## 2020-04-12 LAB — CBC
HCT: 22.6 % — ABNORMAL LOW (ref 36.0–46.0)
Hemoglobin: 7.2 g/dL — ABNORMAL LOW (ref 12.0–15.0)
MCH: 27.1 pg (ref 26.0–34.0)
MCHC: 31.9 g/dL (ref 30.0–36.0)
MCV: 85 fL (ref 80.0–100.0)
Platelets: 253 10*3/uL (ref 150–400)
RBC: 2.66 MIL/uL — ABNORMAL LOW (ref 3.87–5.11)
RDW: 14 % (ref 11.5–15.5)
WBC: 7.7 10*3/uL (ref 4.0–10.5)
nRBC: 0 % (ref 0.0–0.2)

## 2020-04-12 LAB — BASIC METABOLIC PANEL
Anion gap: 10 (ref 5–15)
BUN: 23 mg/dL — ABNORMAL HIGH (ref 6–20)
CO2: 24 mmol/L (ref 22–32)
Calcium: 8.6 mg/dL — ABNORMAL LOW (ref 8.9–10.3)
Chloride: 103 mmol/L (ref 98–111)
Creatinine, Ser: 2.54 mg/dL — ABNORMAL HIGH (ref 0.44–1.00)
GFR, Estimated: 23 mL/min — ABNORMAL LOW (ref >60)
Glucose, Bld: 64 mg/dL — ABNORMAL LOW (ref 70–99)
Potassium: 4.1 mmol/L (ref 3.5–5.1)
Sodium: 137 mmol/L (ref 135–145)

## 2020-04-12 MED ORDER — LABETALOL HCL 5 MG/ML IV SOLN
10.0000 mg | Freq: Once | INTRAVENOUS | Status: AC
  Administered 2020-04-12: 20:00:00 10 mg via INTRAVENOUS
  Filled 2020-04-12: qty 4

## 2020-04-12 MED ORDER — INSULIN GLARGINE 100 UNIT/ML ~~LOC~~ SOLN
20.0000 [IU] | Freq: Every day | SUBCUTANEOUS | Status: DC
  Administered 2020-04-12: 09:00:00 20 [IU] via SUBCUTANEOUS
  Filled 2020-04-12 (×2): qty 0.2

## 2020-04-12 MED ORDER — AMLODIPINE BESYLATE 5 MG PO TABS
5.0000 mg | ORAL_TABLET | Freq: Every day | ORAL | Status: DC
  Administered 2020-04-12 – 2020-04-13 (×2): 5 mg via ORAL
  Filled 2020-04-12 (×2): qty 1

## 2020-04-12 MED ORDER — SODIUM CHLORIDE 0.9 % IV SOLN
3.0000 g | Freq: Three times a day (TID) | INTRAVENOUS | Status: DC
  Administered 2020-04-12 – 2020-04-13 (×3): 3 g via INTRAVENOUS
  Filled 2020-04-12 (×2): qty 8
  Filled 2020-04-12 (×3): qty 3
  Filled 2020-04-12: qty 8

## 2020-04-12 NOTE — Progress Notes (Addendum)
Subjective: HD#4  No acute overnight events.   Ms. Raffo evaluated at bedside this AM. Her son was present as an interpretor. Yesterday she had constipation which turned to diarrhea today. She urinated this morning in the toilet. Her son states she had collected her urine at one point although does not have foley in. Discussion about improving kidney functions and patient shows good understanding about it.   Objective:  Vital signs in last 24 hours: Vitals:   04/11/20 1936 04/12/20 0054 04/12/20 0350 04/12/20 0749  BP: (!) 147/74 139/79 (!) 159/83 (!) 161/66  Pulse: 86 79 83 84  Resp: 18 18 18 20   Temp: 98.9 F (37.2 C) 99.4 F (37.4 C) 99.2 F (37.3 C) 98.5 F (36.9 C)  TempSrc: Oral Oral Oral Oral  SpO2: 97% 96% 93% 98%  Weight:   89.6 kg   Height:       CBC Latest Ref Rng & Units 04/12/2020 04/11/2020 04/10/2020  WBC 4.0 - 10.5 K/uL 7.7 7.4 7.6  Hemoglobin 12.0 - 15.0 g/dL 7.2(L) 7.5(L) 7.7(L)  Hematocrit 36.0 - 46.0 % 22.6(L) 23.2(L) 24.9(L)  Platelets 150 - 400 K/uL 253 200 184   BMP Latest Ref Rng & Units 04/12/2020 04/11/2020 04/10/2020  Glucose 70 - 99 mg/dL 64(L) 110(H) 240(H)  BUN 6 - 20 mg/dL 23(H) 35(H) 33(H)  Creatinine 0.44 - 1.00 mg/dL 2.54(H) 4.51(H) 3.93(H)  BUN/Creat Ratio 9 - 23 - - -  Sodium 135 - 145 mmol/L 137 135 131(L)  Potassium 3.5 - 5.1 mmol/L 4.1 4.1 4.5  Chloride 98 - 111 mmol/L 103 100 97(L)  CO2 22 - 32 mmol/L 24 23 20(L)  Calcium 8.9 - 10.3 mg/dL 8.6(L) 8.7(L) 8.5(L)    Physical exam: General: Well developed, obese female sitting comfortably in bed, NAD CV: Normal rate and Rhythm, systolic murmur Abdomen; soft, no guarding, no rigidity, no CVA tenderness, BS+ Extremities: 2+ peripheral edema Neuro: AAO*3 Psych: Normal mood and affect  Assessment/Plan:  Active Problems:   AKI (acute kidney injury) ([place])   Acute pyelonephritis   Pyelonephritis  Ms. 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    # Bilateral Klebsiella Pyelonephritis Urine culture growing Klebsiella Pneumoniae. Renal ultrasound yesterday does not show any hydronephrosis or visible abscess. Blood culture remains negative, no growth in 3 days. If patient continues to improve, will change to oral antibiotics and d/c tomorrow.   --Unasyn 3g q12h  Day #5 --Zofran 4 mg q6h PRN --Percocet 5 mg Q8h PRN   # AKI Patient's kidney functions continues to improve. sCr~ 2.54 from 4.51 from yesterday and 1.13 on admission. UOP yesterday was ~1.45 L, will continue Foley catheter today for strict I&Os. Nephrology is on board, will wait for their recommendations and clearance before discharge.    --Appreciate Nephrology assistance -- BMP daily -- Monitor urine output  -- Avoid nephrotoxic drugs -- Encourage oral intake   #Diabetes Mellitus Had morning blood glucose 50.  --SSI --Decrease Lantus to 20 units at bedtime    # Hypertension Normotensive now.    --Hold home lisinopril 10 mg daily   # Atrial fibrillation On Eliquis 5 mg daily. Currently in sinus tachycardia due to acute illness. No need for rate control. Due to drop in hemoglobin, will  hold at this time until acute blood loss can be ruled out.    --Hold home Eliquis 5 mg daily   # HFpEF Most recent echo in 08/2018 with EF 50-55%. Prescribed Lasix 20 mg PO daily PRN for lower extremity swelling. Patient euvolemic at this time.    -- Holding home lasix  Prior to Admission Living Arrangement: Home Anticipated Discharge Location: Home Barriers to Discharge: Ongoing medical management Dispo: Anticipated discharge 1-2 days.  Dr. Meredith Staggers Blandina Renaldo Pager: [phone] After 5pm on weekdays and 1pm on weekends: On Call pager [phone]  04/12/2020, 8:11  AM

## 2020-04-12 NOTE — Progress Notes (Signed)
Patient blood pressure 177/77 (104), notified MD awaiting response.

## 2020-04-12 NOTE — TOC Initial Note (Signed)
Transition of Care [facility]) - Initial/Assessment Note    Patient Details  Name: Jennifer [name] MRN: [id number] Date of Birth: 1971-02-25  [facility]) CM/SW Contact:    Zenon Mayo, RN Phone Number: 04/12/2020, 4:22 PM  Clinical Narrative:                 Patient is from home with adult children, she uses a walker at home. She has transportation at [employer].  TOC will continue to follow for needs.  Expected Discharge Plan: Home/Self Care Barriers to Discharge: No Barriers Identified   Patient Goals and CMS Choice Patient states their goals for this hospitalization and ongoing recovery are:: maintain EM and to do things her self   Choice offered to / list presented to : NA  Expected Discharge Plan and Services Expected Discharge Plan: Home/Self Care   Discharge Planning Services: CM Consult Post Acute Care Choice: NA Living arrangements for the past 2 months: Apartment                   DME Agency: NA       HH Arranged: NA          Prior Living Arrangements/Services Living arrangements for the past 2 months: Apartment Lives with:: Adult Children Patient language and need for interpreter reviewed:: Yes        Need for Family Participation in Patient Care: Yes (Comment) Care giver support system in place?: Yes (comment) Current home services: DME (has a walker at home) Criminal Activity/Legal Involvement Pertinent to Current Situation/Hospitalization: No - Comment as needed  Activities of Daily Living Home Assistive Devices/Equipment: None ADL Screening (condition at time of admission) Patient's cognitive ability adequate to safely complete daily activities?: Yes Is the patient deaf or have difficulty hearing?: No Does the patient have difficulty seeing, even when wearing glasses/contacts?: Yes Does the patient have difficulty concentrating, remembering, or making decisions?: No Patient able to express need for assistance with ADLs?: Yes Does the  patient have difficulty dressing or bathing?: No Independently performs ADLs?: Yes (appropriate for developmental age) Does the patient have difficulty walking or climbing stairs?: Yes Weakness of Legs: None Weakness of Arms/Hands: None  Permission Sought/Granted                  Emotional Assessment Appearance:: Appears stated age Attitude/Demeanor/Rapport: Engaged Affect (typically observed): Appropriate Orientation: : Oriented to Self,Oriented to Place,Oriented to  Time,Oriented to Situation Alcohol / Substance Use: Not Applicable Psych Involvement: No (comment)  Admission diagnosis:  Pyelonephritis [N12] Acute pyelonephritis [N10] Patient Active Problem List   Diagnosis Date Noted  . Pyelonephritis 04/09/2020  . Acute pyelonephritis 04/08/2020  . Normocytic anemia 03/27/2020  . Cellulitis (Left 2nd toe) 03/27/2020  . Cellulitis of left foot 03/17/2020  . DKA (diabetic ketoacidosis) ([place]) 03/14/2020  . AKI (acute kidney injury) ([place]) 03/14/2020  . Acute hypotension 03/10/2020  . Right ankle injury 09/28/2019  . Bilateral lower extremity edema 06/07/2019  . Atrial fibrillation ([place]) 09/20/2018  . Migraine   . Hematuria 08/24/2018  . Vulvovaginitis 05/21/2018  . (HFpEF) heart failure with preserved ejection fraction ([place]) 01/01/2018  . 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  . High risk medication use 09/16/2017  . Pseudophakia of right eye 09/16/2017  . Left anterior shoulder pain 07/06/2017  . Hypertension associated with diabetes ([place]) 07/23/2016  . 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 Fatigue 04/02/2016  . 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 Vitamin D  deficiency 10/30/2014  . Hair loss 03/24/2012  . Healthcare maintenance 02/26/2011  . Hyperlipidemia 05/05/2006  . Type II diabetes mellitus ([place]) 04/22/1998   PCP:  Jose Persia, MD Pharmacy:   [id number] - [place], [place] [address] [place] [address] Phone: [phone] Fax: [phone]     Social Determinants of Health (SDOH) Interventions    Readmission Risk Interventions Readmission Risk Prevention Plan 04/12/2020  Transportation Screening Complete  PCP or Specialist Appt within 3-5 Days Complete  HRI or [place] Complete  Social Work Consult for [place] Planning/Counseling Complete  Palliative Care Screening Not Applicable  Medication Review Press photographer) Complete  Some recent data might be hidden

## 2020-04-12 NOTE — Progress Notes (Signed)
Hypoglycemic Event  CBG: 50  Treatment: 1/2 cup juice  Symptoms: asymptomatic  Follow-up CBG: Time: 0639 CBG Result: 95  Possible Reasons for Event: poor oral intake  Comments/MD notified:protocol followed    [place]

## 2020-04-12 NOTE — Progress Notes (Signed)
[name], female   DOB: 01/24/1971, 49 y.o.   MRN: [id number] S: Feeling better today. Her son is in the room with her. O:BP 139/71 (BP Location: Left Arm)   Pulse 79   Temp 98.5 F (36.9 C) (Oral)   Resp 20   Ht 5\' 6"  (1.676 m)   Wt 89.6 kg   LMP 08/21/2016 (Exact Date)   SpO2 98%   BMI 31.88 kg/m   Intake/Output Summary (Last 24 hours) at 04/12/2020 1321 Last data filed at 04/12/2020 1100 Gross per 24 hour  Intake 363 ml  Output 1570 ml  Net -1207 ml   Intake/Output: I/O last 3 completed shifts: In: 563 [P.O.:360; I.V.:3; IV Piggyback:200] Out: 1450 [Urine:1450]  Intake/Output this shift:  Total I/O In: -  Out: 320 [Urine:320] Weight change: 0.014 kg Gen: NAD CVS: RRR Resp: CTA Abd: +BS, soft, Nt/nd Ext: trace pretibial edema bilaterally  Recent Labs  Lab 04/07/20 2016 04/08/20 0906 04/09/20 0329 04/10/20 0432 04/11/20 0314 04/12/20 0311  NA 132* 131* 128* 131* 135 137  K 4.5 4.1 4.6 4.5 4.1 4.1  CL 95* 96* 96* 97* 100 103  CO2 25  --  21* 20* 23 24  GLUCOSE 369* 416* 440* 240* 110* 64*  BUN 13 22* 25* 33* 35* 23*  CREATININE 1.13* 1.20* 2.14* 3.93* 4.51* 2.54*  ALBUMIN 2.8*  --   --   --   --   --   CALCIUM 9.1  --  8.1* 8.5* 8.7* 8.6*  AST 30  --   --   --   --   --   ALT 27  --   --   --   --   --    Liver Function Tests: Recent Labs  Lab 04/07/20 2016  AST 30  ALT 27  ALKPHOS 127*  BILITOT 1.1  PROT 7.5  ALBUMIN 2.8*   Recent Labs  Lab 04/07/20 2016  LIPASE 18   No results for input(s): AMMONIA in the last 168 hours. CBC: Recent Labs  Lab 04/07/20 2016 04/08/20 0906 04/09/20 0329 04/10/20 0432 04/11/20 0314 04/12/20 0311  WBC 6.1  --  6.0 7.6 7.4 7.7  NEUTROABS  --   --   --   --  5.3  --   HGB 9.9*   < > 8.1* 7.7* 7.5* 7.2*  HCT 32.8*   < > 26.3* 24.9* 23.2* 22.6*  MCV 86.1  --  85.9 86.5 83.5 85.0  PLT 184  --  170 184 200 253   < > = values in this interval not displayed.   Cardiac Enzymes: No results  for input(s): CKTOTAL, CKMB, CKMBINDEX, TROPONINI in the last 168 hours. CBG: Recent Labs  Lab 04/11/20 2115 04/11/20 2215 04/12/20 0550 04/12/20 0639 04/12/20 1124  GLUCAP 69* 80 50* 95 77    Iron Studies: No results for input(s): IRON, TIBC, TRANSFERRIN, FERRITIN in the last 72 hours. Studies/Results: No results found. Marland Kitchen. acetaminophen  650 mg Oral Once  . atorvastatin  40 mg Oral Daily  . brimonidine  1 drop Left Eye BID  . insulin aspart  0-20 Units Subcutaneous TID WC  . insulin glargine  20 Units Subcutaneous Daily  . polyethylene glycol  17 g Oral Daily  . sodium chloride flush  3 mL Intravenous Q12H    BMET    Component Value Date/Time   NA 137 04/12/2020 0311   NA 142 03/27/2020 1443   K 4.1 04/12/2020 0311  CL 103 04/12/2020 0311   CO2 24 04/12/2020 0311   GLUCOSE 64 (L) 04/12/2020 0311   BUN 23 (H) 04/12/2020 0311   BUN 14 03/27/2020 1443   CREATININE 2.54 (H) 04/12/2020 0311   CREATININE 0.63 10/28/2014 1649   CALCIUM 8.6 (L) 04/12/2020 0311   GFRNONAA 23 (L) 04/12/2020 0311   GFRNONAA >89 10/28/2014 1649   GFRAA 85 03/27/2020 1443   GFRAA >89 10/28/2014 1649   CBC    Component Value Date/Time   WBC 7.7 04/12/2020 0311   RBC 2.66 (L) 04/12/2020 0311   HGB 7.2 (L) 04/12/2020 0311   HGB 9.6 (L) 03/27/2020 1443   HCT 22.6 (L) 04/12/2020 0311   HCT 30.3 (L) 03/27/2020 1443   PLT 253 04/12/2020 0311   PLT 241 03/27/2020 1443   MCV 85.0 04/12/2020 0311   MCV 85 03/27/2020 1443   MCH 27.1 04/12/2020 0311   MCHC 31.9 04/12/2020 0311   RDW 14.0 04/12/2020 0311   RDW 12.3 03/27/2020 1443   LYMPHSABS 1.1 04/11/2020 0314   MONOABS 0.6 04/11/2020 0314   EOSABS 0.4 04/11/2020 0314   BASOSABS 0.0 04/11/2020 0314     Assessment/Plan: 1. AKI, oliguric- due to multiple renal insults. Likely with ischemic ATN in setting of N/V and poor po intake with concomitant ACE inhibition, relative hypotension in setting of acute pyelonephritis, and further  complicated by IV contrast and NSAID use during this hospitalization. 1. Initially had a low FeNa and has improved with IVF's which are currently off. 2. UOP has markedly improved along with BUN/Cr 3. Continue to hold lisinopril and metformin and would not restart until seen in follow up as an outpatient. 4. Stop ibuprofen.  5. Nothing further to add and will sign off for now.  Please call with questions or concerns. 6. Can follow up in our office in 3-4 weeks after discharge 7. Avoid nephrotoxic agents such as NSAIDs, Cox-II I's, IV contrast, and phosphate containing enemas 2. Acute bilateral pyelonephritis due to Klebsiella UTI- on Unasyn per primary svc. 3. DM- on SSI per primary service. Off of metformin due to AKI 4. HTN- stable. Hold lisinopril for now and follow. 5. Atrial fib on Eliquis 6. HLD- on statin 7. Diastolic CHF- mild volume overload. Hold lasix for now and follow  8. 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 [place], MD [facility] [phone]

## 2020-04-12 NOTE — Progress Notes (Signed)
MD order foley placed but patient refused, agreed to notify staff every time she pees and to go in the hat provided.

## 2020-04-12 NOTE — Progress Notes (Signed)
PHARMACY NOTE:  ANTIMICROBIAL RENAL DOSAGE ADJUSTMENT  Current antimicrobial regimen includes a mismatch between antimicrobial dosage and estimated renal function.  As per policy approved by the Pharmacy & Therapeutics and Medical Executive Committees, the antimicrobial dosage will be adjusted accordingly.  Current antimicrobial dosage:  Unasyn 3g IV q12  Indication: pyelonephritis  Renal Function:  Estimated Creatinine Clearance: 30.5 mL/min (A) (by C-G formula based on SCr of 2.54 mg/dL (H)). []      On intermittent HD, scheduled: []      On CRRT    Antimicrobial dosage has been changed to:  Unasyn 3g IV q8  Additional comments:   Onnie Boer, PharmD, BCIDP, AAHIVP, CPP Infectious Disease Pharmacist 04/12/2020 9:54 AM

## 2020-04-13 LAB — BASIC METABOLIC PANEL
Anion gap: 11 (ref 5–15)
BUN: 10 mg/dL (ref 6–20)
CO2: 26 mmol/L (ref 22–32)
Calcium: 8.6 mg/dL — ABNORMAL LOW (ref 8.9–10.3)
Chloride: 106 mmol/L (ref 98–111)
Creatinine, Ser: 1.41 mg/dL — ABNORMAL HIGH (ref 0.44–1.00)
GFR, Estimated: 46 mL/min — ABNORMAL LOW (ref >60)
Glucose, Bld: 79 mg/dL (ref 70–99)
Potassium: 3.5 mmol/L (ref 3.5–5.1)
Sodium: 143 mmol/L (ref 135–145)

## 2020-04-13 LAB — GLUCOSE, CAPILLARY
Glucose-Capillary: 47 mg/dL — ABNORMAL LOW (ref 70–99)
Glucose-Capillary: 80 mg/dL (ref 70–99)
Glucose-Capillary: 165 mg/dL — ABNORMAL HIGH (ref 70–99)

## 2020-04-13 LAB — CBC
HCT: 25.2 % — ABNORMAL LOW (ref 36.0–46.0)
Hemoglobin: 8 g/dL — ABNORMAL LOW (ref 12.0–15.0)
MCH: 26.8 pg (ref 26.0–34.0)
MCHC: 31.7 g/dL (ref 30.0–36.0)
MCV: 84.6 fL (ref 80.0–100.0)
Platelets: 294 10*3/uL (ref 150–400)
RBC: 2.98 MIL/uL — ABNORMAL LOW (ref 3.87–5.11)
RDW: 14.4 % (ref 11.5–15.5)
WBC: 8.2 10*3/uL (ref 4.0–10.5)
nRBC: 0 % (ref 0.0–0.2)

## 2020-04-13 LAB — CULTURE, BLOOD (ROUTINE X 2)
Culture: NO GROWTH
Culture: NO GROWTH
Special Requests: ADEQUATE

## 2020-04-13 MED ORDER — INSULIN NPH (HUMAN) (ISOPHANE) 100 UNIT/ML ~~LOC~~ SUSP: SUBCUTANEOUS | 0 refills | Status: DC

## 2020-04-13 MED ORDER — AMLODIPINE BESYLATE 10 MG PO TABS: 10.0000 mg | ORAL_TABLET | Freq: Every day | ORAL | 0 refills | Status: DC

## 2020-04-13 MED ORDER — AMLODIPINE BESYLATE 5 MG PO TABS
5.0000 mg | ORAL_TABLET | Freq: Once | ORAL | Status: AC
  Administered 2020-04-13: 12:00:00 5 mg via ORAL
  Filled 2020-04-13: qty 1

## 2020-04-13 MED ORDER — AMOXICILLIN-POT CLAVULANATE 875-125 MG PO TABS: 1.0000 | ORAL_TABLET | Freq: Two times a day (BID) | ORAL | 0 refills | Status: AC

## 2020-04-13 NOTE — Progress Notes (Addendum)
Subjective: HD#5 No acute overnight events. Jennifer [name] evaluated at bedside this AM. She endorses some diarrhea but denies nausea, vomiting, and abdominal pain. Discussion about discharge today with change in medications and shows good understanding.  Objective:  Vital signs in last 24 hours: Vitals:   04/12/20 1800 04/12/20 2008 04/13/20 0512 04/13/20 0950  BP: (!) 177/77 (!) 178/87 (!) 149/63 (!) 179/79  Pulse:  81 81 84  Resp: (!) 21 18 18    Temp:  99.1 F (37.3 C) 98.7 F (37.1 C)   TempSrc:  Oral Oral   SpO2:  100% 97% 99%  Weight:   86.3 kg   Height:       CBC Latest Ref Rng & Units 04/13/2020 04/12/2020 04/11/2020  WBC 4.0 - 10.5 K/uL 8.2 7.7 7.4  Hemoglobin 12.0 - 15.0 g/dL 8.0(L) 7.2(L) 7.5(L)  Hematocrit 36.0 - 46.0 % 25.2(L) 22.6(L) 23.2(L)  Platelets 150 - 400 K/uL 294 253 200   BMP Latest Ref Rng & Units 04/13/2020 04/12/2020 04/11/2020  Glucose 70 - 99 mg/dL 79 64(L) 110(H)  BUN 6 - 20 mg/dL 10 23(H) 35(H)  Creatinine 0.44 - 1.00 mg/dL 1.41(H) 2.54(H) 4.51(H)  BUN/Creat Ratio 9 - 23 - - -  Sodium 135 - 145 mmol/L 143 137 135  Potassium 3.5 - 5.1 mmol/L 3.5 4.1 4.1  Chloride 98 - 111 mmol/L 106 103 100  CO2 22 - 32 mmol/L 26 24 23   Calcium 8.9 - 10.3 mg/dL 8.6(L) 8.6(L) 8.7(L)    Physical exam: General: Well developed, obese female sitting comfortably in bed, NAD CV: Normal rate and Rhythm, systolic murmur Abdomen: Soft, no guarding, no rigidity, no CVA tenderness, BS+ Extremities: Trace peripheral edema Neuro: AAO*3 Psych: Pleasent mood and affect  Assessment/Plan:  Active Problems:   AKI (acute kidney injury) ([place])   Acute pyelonephritis   Pyelonephritis  Jennifer [name] is a 49 yo F with h/o poorly controlled DM, A-fib on Eliquis, HFpEF (EF 50-55%in 5/20), Hyperlipidemia, migraines, 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   # Bilateral Klebsiella Pyelonephritis Urine culture growing Klebsiella Pneumoniae. Renal ultrasound does not show any hydronephrosis or visible abscess. Blood culture remains negative, no growth in 4 days. Plan is to discharge today on oral antibiotics for 8 more days.  --Unasyn 3g q12h  Day #6 --Zofran 4 mg q6h PRN --Percocet 5 mg Q8h PRN   # AKI Patient's kidney functions continues to improve. sCr~ 1.41 from 2.54 yesterday. UOP yesterday was ~3.1 L Nephrology cleared patient for discharge, appreciate their assistance.    --Appreciate Nephrology assistance -- BMP daily -- Monitor urine output  -- Avoid nephrotoxic drugs -- Encourage oral intake   #Diabetes Mellitus Additional hypoglycemic event likely due to poor appetite. Will discontinue Lantus while admitted.  --SSI  --Hold long acting insulin till appetite improves    # Hypertension Hypertensive however no evidence of urgency or emergency. Received 1 time dose of Labetalol yesterday.    --Hold home lisinopril 10 mg daily --Start Amlodipine 5 mg daily    # Atrial fibrillation On Eliquis 5 mg daily. Currently in sinus tachycardia due to acute illness. No need for rate control. Due to drop in hemoglobin, will hold at this time until acute blood loss can be ruled out.    --Hold home Eliquis 5 mg daily   # HFpEF Most recent echo in 08/2018 with  EF 50-55%. Prescribed Lasix 20 mg PO daily PRN for lower extremity swelling. Patient euvolemic at this time.    -- Holding home lasix  Prior to Admission Living Arrangement: Home Anticipated Discharge Location: Home Barriers to Discharge: None Dispo: Anticipated discharge 0 days.  Dr. Meredith Staggers Cesario Weidinger Pager: [phone] After 5pm on weekdays and 1pm on weekends: On Call pager [phone]  04/13/2020, 10:15 AM

## 2020-04-13 NOTE — Progress Notes (Signed)
Patient's son Earlie Server is at the bedside. Went over discharge instruction with patient and patient's son. Educated patient on insulin dosage and times to check blood sugar prior to insulin adminstration. Patient verbalizes understanding. Also educated patient on checking blood pressure and blood sugar in the morning before any medication adminstration for accuate results at the same time every day and to call PCP if BP is under 100 SBP or to find a sugary treat or drink if CBG is under 70. Patient verbalizes understanding Patient's son verbalizes understanding on where to pick up medications. Tele monitor has been removed, PIV has been removed and CCMD has been notified of patient's d/c.

## 2020-04-13 NOTE — Hospital Course (Addendum)
Ms. 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    # Bilateral Pyelonephritis Presented with fever, chills, flank pain and back pain. WBC's 6.1. Urinalysis shows glucosuria, proteinuria (100), large leukocytes, >50 WBC, few bacteria. CT pelvis shows bilateral pyelonephritis, no discrete renal abscess. Received Rocephin in ED. Urine cultures growing Klebsiella Pneumoniae that is resistant to Ampicillin and Macrobid. Initially on Ceftriaxone that was broadened to Cefepime due to risk for MDR pathogens. Given that susceptibilities returned, antibiotics  narrowed to Unasyn per pharmacy recommendations. Blood cultures have remained negative. Plan is to complete oral antibiotic course for 8 more days after discharge.    AKI, oliguric- due to multiple renal insults.  Likely with ischemic ATN in setting of N/V and poor po intake with concomitant ACE inhibition, relative hypotension in setting of acute pyelonephritis, and further complicated by IV contrast and NSAID use during this hospitalization.Initially had a low FeNa and has improved with IVF's .UOP has markedly improved along with BUN/Cr. Continued to hold lisinopril and metformin and would not restart until seen in follow up as an outpatient.Stop ibuprofen. Avoid nephrotoxic agents such as NSAIDs, Cox-II I's, IV contrast, and phosphate containing enemas

## 2020-04-13 NOTE — Progress Notes (Signed)
Hypoglycemic Event  CBG: 47  Treatment: 1/2 cup juice  Symptoms: asymptomatic  Follow-up CBG: Time: 5465 CBG Result:80  Possible Reasons for Event: poor PO intake  Comments/MD notified: protocol followed    Hartman Lions

## 2020-04-13 NOTE — Progress Notes (Signed)
Attempted to call son Jennifer [name] to inform family that patient will be discharging today. Call was unsuccessful. Will try again in 30 mins.

## 2020-04-13 NOTE — Consult Note (Signed)
   [facility] Inpatient Consult   04/13/2020  Jennifer [name] Aug 31, 1970 [id number]   [place] Medicaid Prepaid plan/[employer] [place] and [facility] Embedded team:  Patient was screened for [place] Management services. Patient will have the transition of care call conducted by the primary care provider at [facility]. This patient is also active in an Embedded practice  chronic disease management Embedded Care Management team.  Plan: Notification sent to the [facility] Embedded Care Management to check for ongoing follow up with the Embedded Team or with the Managed Medicaid team.     Please contact for further questions,  Jennifer Brood, RN BSN [facility] Liaison  [phone] business mobile phone Toll free office [phone]  Fax number: [phone] [name]

## 2020-04-14 NOTE — Discharge Summary (Signed)
Name: Jennifer [name] MRN: [id number] DOB: 1970-07-26 49 y.o. PCP: Jennifer Persia, MD  Date of Admission: 04/07/2020  7:55 PM Date of Discharge: 04/13/2020 Attending Physician: Dr. Dareen [name] Discharge Diagnosis: 1. Bilateral Pyelonephritis 2. Acute Kidney Injury  Discharge Medications: Allergies as of 04/13/2020   No Known Allergies     Medication List    STOP taking these medications   cephALEXin 500 MG capsule Commonly known as: KEFLEX   DULoxetine 60 MG capsule Commonly known as: CYMBALTA   furosemide 20 MG tablet Commonly known as: LASIX   insulin aspart 100 UNIT/ML injection Commonly known as: novoLOG   insulin glargine 100 UNIT/ML injection Commonly known as: LANTUS   lisinopril 10 MG tablet Commonly known as: ZESTRIL   metFORMIN 1000 MG tablet Commonly known as: GLUCOPHAGE     TAKE these medications   Accu-Chek FastClix Lancets Misc Check blood sugar 4 times a day   Accu-Chek Guide test strip Generic drug: glucose blood Check blood sugar 4 times per day   Accu-Chek Guide w/Device Kit 1 each by Does not apply route 4 (four) times daily.   acetaminophen 500 MG tablet Commonly known as: TYLENOL Take 500 mg by mouth every 6 (six) hours as needed for mild pain.   amLODipine 10 MG tablet Commonly known as: NORVASC Take 1 tablet (10 mg total) by mouth daily.   amoxicillin-clavulanate 875-125 MG tablet Commonly known as: Augmentin Take 1 tablet by mouth 2 (two) times daily for 9 days.   apixaban 5 MG Tabs tablet Commonly known as: ELIQUIS Take 1 tablet (5 mg total) by mouth 2 (two) times daily.   atorvastatin 40 MG tablet Commonly known as: Lipitor Take 1 tablet (40 mg total) by mouth daily.   brimonidine 0.2 % ophthalmic solution Commonly known as: ALPHAGAN Place 1 drop into the left eye 2 (two) times daily.   dorzolamide-timolol 22.3-6.8 MG/ML ophthalmic solution Commonly known as: COSOPT Place 1 drop into the left eye 2 (two) times  daily.   ferrous sulfate 325 (65 FE) MG tablet Take 1 tablet (325 mg total) by mouth every Monday, Wednesday, and Friday for 14 days.   insulin NPH Human 100 UNIT/ML injection Commonly known as: NOVOLIN N Inject 0.3 mLs (30 Units total) into the skin daily before breakfast AND 0.15 mLs (15 Units total) daily with supper. You must eat with this medication!. What changed: See the new instructions.   Insulin Pen Needle 31G X 5 MM Misc 1 Dose by Does not apply route 2 (two) times daily.   multivitamin with minerals tablet Take 1 tablet by mouth daily.   OPTIVE 0.5-0.9 % ophthalmic solution Generic drug: carboxymethylcellul-glycerin Place 1 drop into both eyes 2 (two) times daily as needed for dry eyes.   Trulicity 1.5 AU/6.3FH Sopn Generic drug: Dulaglutide Inject 1.5 mg into the skin once a week.   zinc gluconate 50 MG tablet Take 1 tablet (50 mg total) by mouth daily.       Disposition and follow-up:   [name] was discharged from [facility] in Stable condition.  At the hospital follow up visit please address:  1.  -Bilateral Pyelonephritis: Please complete your antibiotic course at home and see your PCP on coming Monday.  - AKI- Improved, no further work up at this time. Please see Nephrologist in 3-4 weeks if needed.   2.  Labs / imaging needed at time of follow-up: None  3.  Pending labs/ test needing follow-up: Blood glucose, WBC, sCr  Follow-up Appointments:  Follow-up Information    Jennifer Persia, MD.   Specialty: Internal Medicine Contact information: [address] [address] [place] Hospital Course by problem list: 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    Bilateral Pyelonephritis Presented with fever, chills, flank pain and back pain. WBC's 6.1. Urinalysis shows glucosuria, proteinuria (100), large leukocytes, >50 WBC, few bacteria. CT pelvis shows bilateral pyelonephritis, no discrete renal abscess. Received Rocephin in ED. Urine cultures growing Klebsiella Pneumoniae that is resistant to Ampicillin and Macrobid. Initially on Ceftriaxone that was broadened to Cefepime due to risk for MDR pathogens. Given that susceptibilities returned, antibiotics  narrowed to Unasyn per pharmacy recommendations. Blood cultures have remained negative. Plan is to complete oral antibiotic course for 8 more days after discharge.    AKI, oliguric- due to multiple renal insults.  Likely with ischemic ATN in setting of N/V and poor po intake with concomitant ACE inhibition, relative hypotension in setting of acute pyelonephritis, and further complicated by IV contrast and NSAID use during this hospitalization.Initially had a low FeNa and has improved with IVF's .UOP has markedly improved along with BUN/Cr. Continued to hold lisinopril and metformin and would not restart until seen in follow up as an outpatient.Stop ibuprofen. Avoid nephrotoxic agents such as NSAIDs, Cox-II I's, IV contrast, and phosphate containing enemas  Discharge Vitals:   BP (!) 147/61 (BP Location: Left Arm)   Pulse 83   Temp 98.7 F (37.1 C) (Oral)   Resp 18   Ht 5' 6" (1.676 m)   Wt 86.3 kg   LMP 08/21/2016 (Exact Date)   SpO2 93%   BMI 30.70 kg/m   Pertinent Labs, Studies, and Procedures:  CBC Latest Ref Rng & Units 04/13/2020 04/12/2020 04/11/2020  WBC 4.0 - 10.5 K/uL 8.2 7.7 7.4  Hemoglobin 12.0 - 15.0 g/dL 8.0(L) 7.2(L) 7.5(L)  Hematocrit 36.0 - 46.0 % 25.2(L) 22.6(L) 23.2(L)  Platelets 150 - 400 K/uL 294 253 200   BMP Latest Ref Rng & Units 04/13/2020 04/12/2020 04/11/2020  Glucose 70 - 99 mg/dL 79 64(L) 110(H)   BUN 6 - 20 mg/dL 10 23(H) 35(H)  Creatinine 0.44 - 1.00 mg/dL 1.41(H) 2.54(H) 4.51(H)  BUN/Creat Ratio 9 - 23 - - -  Sodium 135 - 145 mmol/L 143 137 135  Potassium 3.5 - 5.1 mmol/L 3.5 4.1 4.1  Chloride 98 - 111 mmol/L 106 103 100  CO2 22 - 32 mmol/L _0 Calcium 8.9 - 10.3 mg/dL 8.6(L) 8.6(L) 8.7(L)   Chest xray:  IMPRESSION: No active disease.  CT abdomen:  IMPRESSION: 1. Heterogeneous enhancement involving both kidneys. Findings are suggestive for bilateral pyelonephritis. Focal low density in the posterolateral left kidney measures roughly 8 mm and could represent focal phlegmon. No discrete renal abscesses at this time. 2. Mild urinary bladder wall thickening and likely related to cystitis based on the renal findings.  US Renal:  IMPRESSION: Negative renal ultrasound.  No hydronephrosis or visible abscess.    Discharge Instructions: Discharge Instructions    Call MD for:  difficulty breathing, headache or visual disturbances   Complete by: As  directed    Call MD for:  extreme fatigue   Complete by: As directed    Call MD for:  persistant dizziness or light-headedness   Complete by: As directed    Call MD for:  persistant nausea and vomiting   Complete by: As directed    Call MD for:  severe uncontrolled pain   Complete by: As directed    Call MD for:  temperature >100.4   Complete by: As directed    Diet - low sodium heart healthy   Complete by: As directed    Discharge instructions   Complete by: As directed    Mrs. Mittag and Family,   You were admitted to the hospital due to an infection in your kidneys called Pyelonephritis. While here, you were treated with antibiotics. When you go home, make sure to complete the antibiotic course as instructed below:   START Augmentin (Amoxicillin-Clavulanate). Take 1 tablet in the morning and another in the evening, starting on 12/23 PM. Take until the bottle is finished.   While here, your kidneys were  also injured. By the day of discharge, your kidney numbers were improving greatly. Make sure to avoid NSAIDs (aleve, ibuprofen, motrin). Due to this injury, some of your medications were adjusted.   STOP Metformin until your follow up visit. If your kidney function has improved it may be restarted STOP Duloxetine  STOP Lasix (Furosemide) STOP Lisinopril   In the hospital, you were noted to have some low blood sugar. Your current home medications include an insulin called Novolin. Your prior instructions are to take 30 units in the morning with a meal and 15 units in the evening with a meal. It is very important that you eat with this medication, even if you do not have an appetite. If you cannot eat, do not administer the medication and make a follow up appointment with your  primary care provider immediately.  Lastly, since BP was running high while admitted, we started a new blood pressure medication called Amlodipine. This medication is safer for the kidneys than the prior medication (Lisinopril)   START Amlodipine 10 mg once per day    NOTE TO FAMILY:  It is very important that Mrs. Bonenfant follow these instructions. If you have any questions or concerns, please call the clinic immediately at [phone].    - Dr. Charleen Kirks   Increase activity slowly   Complete by: As directed       Signed: Honor Junes, MD 04/14/2020, 8:51 AM   Pager: [phone]

## 2020-04-17 ENCOUNTER — Telehealth: Payer: Self-pay

## 2020-04-17 NOTE — Telephone Encounter (Signed)
Pt's daughter requesting to speak with a nurse about high blood sugar 400. Please call back.

## 2020-04-17 NOTE — Telephone Encounter (Signed)
Daughter states cbg was 31 yesterday but now 200's, states she thinks pt is eating correctly and drinking water but she's not sure. Cautioned her if mother becomes hard to communicate with, anxious, confused, angry, N&V, abd pain to call 911. Continue to check cbgs and come to wed appt. She is agreeable

## 2020-04-19 ENCOUNTER — Ambulatory Visit (INDEPENDENT_AMBULATORY_CARE_PROVIDER_SITE_OTHER): Payer: Medicaid Other | Admitting: Internal Medicine

## 2020-04-19 VITALS — BP 127/57 | HR 81 | Temp 98.5°F | Wt 194.0 lb

## 2020-04-19 DIAGNOSIS — N12 Tubulo-interstitial nephritis, not specified as acute or chronic: Secondary | ICD-10-CM | POA: Diagnosis not present

## 2020-04-19 DIAGNOSIS — N10 Acute tubulo-interstitial nephritis: Secondary | ICD-10-CM

## 2020-04-19 DIAGNOSIS — D649 Anemia, unspecified: Secondary | ICD-10-CM | POA: Diagnosis not present

## 2020-04-19 DIAGNOSIS — Z794 Long term (current) use of insulin: Secondary | ICD-10-CM | POA: Diagnosis not present

## 2020-04-19 DIAGNOSIS — E11319 Type 2 diabetes mellitus with unspecified diabetic retinopathy without macular edema: Secondary | ICD-10-CM

## 2020-04-19 DIAGNOSIS — N179 Acute kidney failure, unspecified: Secondary | ICD-10-CM | POA: Diagnosis not present

## 2020-04-19 NOTE — Progress Notes (Signed)
CC: Fatigue  HPI: Jennifer [name] is a 49 y.o. with PMH listed below presenting with complaint of fatigue. Please see problem based assessment and plan for further details.  Past Medical History:  Diagnosis Date  . Anemia, iron deficiency   . Atrial fibrillation ([place])   . Blindness of left eye   . Decreased visual acuity    Left eye  . 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 Hair loss   . History of fracture of clavicle 05/18/2015  . Hyperlipidemia   . Iron deficiency anemia 05/13/2013  . Pain, dental 08/19/2018   Tooth pain/facial swelling: has poor dentition at baseline, history of dental abscess.  She has not seen a dentist in about one year.  Dentist is on [address].  No fevers chills or systemic symptoms.  Diabetes has been poorly controlled for some time.  She said it has improved recently averaging around 140.  Called the dentist said they would not see patients until May 14th but the urgency o  . Pap smear abnormality of cervix with LGSIL   . Routine/ritual circumcision   . Type II diabetes mellitus ([place])   . Uveitis     Review of Systems: Review of Systems  Constitutional: Positive for malaise/fatigue. Negative for chills and fever.  Eyes: Negative for blurred vision.  Respiratory: Positive for shortness of breath. Negative for cough and wheezing.   Cardiovascular: Negative for chest pain, palpitations and leg swelling.  Gastrointestinal: Negative for blood in stool, constipation, nausea and vomiting.  Genitourinary: Negative for dysuria, hematuria and urgency.  Neurological: Negative for dizziness and headaches.  All other systems reviewed and are negative.    Physical Exam: Vitals:   04/19/20 1505  BP: (!) 127/57  Pulse: 81  Temp: 98.5 F (36.9 C)  TempSrc: Oral  SpO2: 100%  Weight: 194 lb (88 kg)    Gen:  Well-developed, well nourished, NAD HEENT: NCAT head, hearing intact CV: RRR, S1, S2 normal Pulm: CTAB, No rales, no wheezes Abd: NTND, BS+, No CVA tenderness Extm: ROM intact, Peripheral pulses intact, No peripheral edema Skin: Dry, Warm, normal turgor  Assessment & Plan:   Pyelonephritis Jennifer [name] is a 49 yo F w/ PMH of uncontrolled T2Dm, HFpEF, HLD presenting to [facility] for hospital follow up visit. She had a recent hospitalization for pyelonephritis and was discharged 5 days prior with Augmentin to complete 14 day course of abx treatment. She states she has been taking her antibiotics as prescribed and has had no reoccurrence of her dysuria, flank or abdominal pain. She mentions increasing stool frequency since her antibiotic treatment up to 5 times daily but mentions seeing formed stools and not liquid diarrhea.  A/P Present for f/u after hospitalization. Currently on day 11 of 14 for abx treatment. Urine culture from hospitalization showing Kleb pneumo with resistance to ampicillin. Clinically improving. Will repeat labs. Advised in importance of improving glycemic control to prevent reoccurence. - C/w augmentin - Cbc, bmp   Type II diabetes mellitus ([place]) Lab Results  Component Value Date   HGBA1C >14.0 (A) 02/08/2020   Has hx of uncontrolled diabetes complicated by medication non-adherence due to language barrier, poor health literacy. At discharge put on metformin 1000mg  BID, trulicity 1.5mg  daily, Novoloin 30 units BID. Mentioned episode of hyperglycemia yesterday but earlier in the week but review of personal glucometer shows most cbg readings between  150-200s. No hypoglycemic episodes. Likely due to episodes of dietary indiscretion.  - C/w metformin 1000mg  BID, trulicity 1.5mg  daily, Novolin 30 units BID - F/u in 1 month for hgb a1c check  Normocytic anemia Cbc from hospitalization shows evidence of acute on chronic anemia with evidence of hematuria with pyelonephritis. Had  stabilized around 8.0 at time of discharge. Jennifer [name] mentions endorsing fatigue and dyspnea with exertion after discharge which is likely due to symptomatic anemia. Previously on iron supplementation but ferritin improved with oral supplementation. Suspected to have component of anemia of chronic disease.  A/P Anemia of chornic disease +/- iron deficiency anemia due to hematuria. - Check cbc - If continuing to be anemic, can resume iron supplementation after finishing antibiotic course - Need better control of her diabetes  AKI (acute kidney injury) (HCC) During recent hospitalization noted to have acute renal failure attributed to dehydration in setting of hyperglycemia, pyelonephritis. Improved with fluid resuscitation. She mentions trying to increase her fluid intake at home  - Check bmp today    Patient discussed with Dr.  -Criselda Peaches, PGY3 [facility] Internal Medicine Pager: [phone]

## 2020-04-19 NOTE — Patient Instructions (Signed)
Dear [name],  Thank you for allowing [place] to provide your care today. Today we discussed your recent kidney infection    I have ordered cbc, bmp labs for you. I will call if any are abnormal.    Today we made no changes to your medications:    Please follow-up in 4 weeks.    Please call the internal medicine center clinic if you have any questions or concerns, we may be able to help and keep you from a long and expensive emergency room wait. Our clinic and after hours phone number is [phone], the best time to call is Monday through Friday 9 am to 4 pm but there is always someone available 24/7 if you have an emergency. If you need medication refills please notify your pharmacy one week in advance and they will send [place] a request.    If you have not gotten the COVID vaccine, I recommend doing so:  You may get it at your local [facility] or [facility] OR To schedule an appointment for a COVID vaccine or be added to the vaccine wait list: Go to [email or web address]   OR Go to [email or web address]                  OR Call [phone]                                     OR Call [phone] and select Option 2  Thank you for choosing [facility]

## 2020-04-20 ENCOUNTER — Encounter: Payer: Self-pay | Admitting: Internal Medicine

## 2020-04-20 LAB — CBC WITH DIFFERENTIAL/PLATELET
Basophils Absolute: 0 10*3/uL (ref 0.0–0.2)
Basos: 0 %
EOS (ABSOLUTE): 0.2 10*3/uL (ref 0.0–0.4)
Eos: 2 %
Hematocrit: 29.8 % — ABNORMAL LOW (ref 34.0–46.6)
Hemoglobin: 8.9 g/dL — ABNORMAL LOW (ref 11.1–15.9)
Immature Grans (Abs): 0.1 10*3/uL (ref 0.0–0.1)
Immature Granulocytes: 1 %
Lymphocytes Absolute: 2.2 10*3/uL (ref 0.7–3.1)
Lymphs: 29 %
MCH: 25.1 pg — ABNORMAL LOW (ref 26.6–33.0)
MCHC: 29.9 g/dL — ABNORMAL LOW (ref 31.5–35.7)
MCV: 84 fL (ref 79–97)
Monocytes Absolute: 0.4 10*3/uL (ref 0.1–0.9)
Monocytes: 6 %
Neutrophils Absolute: 4.6 10*3/uL (ref 1.4–7.0)
Neutrophils: 62 %
Platelets: 262 10*3/uL (ref 150–450)
RBC: 3.54 x10E6/uL — ABNORMAL LOW (ref 3.77–5.28)
RDW: 13.2 % (ref 11.7–15.4)
WBC: 7.4 10*3/uL (ref 3.4–10.8)

## 2020-04-20 LAB — BMP8+ANION GAP
Anion Gap: 16 mmol/L (ref 10.0–18.0)
BUN/Creatinine Ratio: 14 (ref 9–23)
BUN: 14 mg/dL (ref 6–24)
CO2: 22 mmol/L (ref 20–29)
Calcium: 9.1 mg/dL (ref 8.7–10.2)
Chloride: 101 mmol/L (ref 96–106)
Creatinine, Ser: 1.02 mg/dL — ABNORMAL HIGH (ref 0.57–1.00)
GFR calc Af Amer: 75 mL/min/{1.73_m2} (ref >59)
GFR calc non Af Amer: 65 mL/min/{1.73_m2} (ref >59)
Glucose: 250 mg/dL — ABNORMAL HIGH (ref 65–99)
Potassium: 4.3 mmol/L (ref 3.5–5.2)
Sodium: 139 mmol/L (ref 134–144)

## 2020-04-20 NOTE — Assessment & Plan Note (Signed)
Cbc from hospitalization shows evidence of acute on chronic anemia with evidence of hematuria with pyelonephritis. Had stabilized around 8.0 at time of discharge. Jennifer [name] mentions endorsing fatigue and dyspnea with exertion after discharge which is likely due to symptomatic anemia. Previously on iron supplementation but ferritin improved with oral supplementation. Suspected to have component of anemia of chronic disease.  A/P Anemia of chornic disease +/- iron deficiency anemia due to hematuria. - Check cbc - If continuing to be anemic, can resume iron supplementation after finishing antibiotic course - Need better control of her diabetes

## 2020-04-20 NOTE — Assessment & Plan Note (Signed)
[name] is a 49 yo F w/ PMH of uncontrolled T2Dm, HFpEF, HLD presenting to [facility] for hospital follow up visit. She had a recent hospitalization for pyelonephritis and was discharged 5 days prior with Augmentin to complete 14 day course of abx treatment. She states she has been taking her antibiotics as prescribed and has had no reoccurrence of her dysuria, flank or abdominal pain. She mentions increasing stool frequency since her antibiotic treatment up to 5 times daily but mentions seeing formed stools and not liquid diarrhea.  A/P Present for f/u after hospitalization. Currently on day 11 of 14 for abx treatment. Urine culture from hospitalization showing Kleb pneumo with resistance to ampicillin. Clinically improving. Will repeat labs. Advised in importance of improving glycemic control to prevent reoccurence. - C/w augmentin - Cbc, bmp

## 2020-04-20 NOTE — Assessment & Plan Note (Addendum)
During recent hospitalization noted to have acute renal failure attributed to dehydration in setting of hyperglycemia, pyelonephritis. Improved with fluid resuscitation. She mentions trying to increase her fluid intake at home  - Check bmp today  Addendum: Now resolved, attempted to call home number to discuss results. Patient did not pick up. Voicemail not set up

## 2020-04-20 NOTE — Assessment & Plan Note (Signed)
Lab Results  Component Value Date   HGBA1C >14.0 (A) 02/08/2020   Has hx of uncontrolled diabetes complicated by medication non-adherence due to language barrier, poor health literacy. At discharge put on metformin 1000mg  BID, trulicity 1.5mg  daily, Novoloin 30 units BID. Mentioned episode of hyperglycemia yesterday but earlier in the week but review of personal glucometer shows most cbg readings between 150-200s. No hypoglycemic episodes. Likely due to episodes of dietary indiscretion.  - C/w metformin 1000mg  BID, trulicity 1.5mg  daily, Novolin 30 units BID - F/u in 1 month for hgb a1c check

## 2020-04-24 ENCOUNTER — Telehealth: Payer: Self-pay

## 2020-04-24 ENCOUNTER — Telehealth: Payer: Self-pay | Admitting: Internal Medicine

## 2020-04-24 DIAGNOSIS — Z794 Long term (current) use of insulin: Secondary | ICD-10-CM

## 2020-04-24 DIAGNOSIS — D649 Anemia, unspecified: Secondary | ICD-10-CM

## 2020-04-24 DIAGNOSIS — E11319 Type 2 diabetes mellitus with unspecified diabetic retinopathy without macular edema: Secondary | ICD-10-CM

## 2020-04-24 MED ORDER — METFORMIN HCL 1000 MG PO TABS: 1000.0000 mg | ORAL_TABLET | Freq: Two times a day (BID) | ORAL | 11 refills | Status: DC

## 2020-04-24 MED ORDER — FERROUS SULFATE 325 (65 FE) MG PO TABS: 325.0000 mg | ORAL_TABLET | ORAL | 3 refills | Status: DC

## 2020-04-24 NOTE — Telephone Encounter (Signed)
Pt's daughter requesting lab results, please call back.

## 2020-04-24 NOTE — Telephone Encounter (Signed)
Attempted to call Ms.Corprew's listed number to discuss lab results. Patient did not pick up. Voicemail not set up.

## 2020-04-24 NOTE — Telephone Encounter (Signed)
Discussed with Ms.Elgin's daughter, Dannielle Burn, regarding the lab results, discussed resolution of her AKI and improvement in her anemia. Advised to restart her metformin now that her AKI has resolved. All other questions and concerns addressed.

## 2020-04-25 NOTE — Progress Notes (Signed)
Internal Medicine Clinic Attending  Case discussed with Dr. Lee  At the time of the visit.  We reviewed the resident's history and exam and pertinent patient test results.  I agree with the assessment, diagnosis, and plan of care documented in the resident's note.    

## 2020-04-27 ENCOUNTER — Encounter: Payer: Medicaid Other | Attending: Internal Medicine | Admitting: Registered"

## 2020-04-27 ENCOUNTER — Encounter: Payer: Self-pay | Admitting: Registered"

## 2020-04-27 ENCOUNTER — Other Ambulatory Visit: Payer: Self-pay

## 2020-04-27 DIAGNOSIS — E11319 Type 2 diabetes mellitus with unspecified diabetic retinopathy without macular edema: Secondary | ICD-10-CM | POA: Diagnosis not present

## 2020-04-27 DIAGNOSIS — Z794 Long term (current) use of insulin: Secondary | ICD-10-CM | POA: Insufficient documentation

## 2020-04-27 DIAGNOSIS — E119 Type 2 diabetes mellitus without complications: Secondary | ICD-10-CM

## 2020-04-27 NOTE — Patient Instructions (Signed)
Goals:  Follow Diabetes Meal Plan as instructed  Eat 3 meals and 2 snacks, every 3-5 hrs  Limit carbohydrate intake to 30-45 grams carbohydrate/meal  Include 1/2 plate of non-starchy vegetables + 1/4 plate of lean protein + 1/4 plate of starch/grain.   Limit carbohydrate intake to 15-30 grams carbohydrate/snack  Have 1 source carboydrates + protein  Ex. Fruit and peanut butter  Ex. Crackers and deli meat  Add lean protein foods to meals/snacks  Monitor glucose levels as instructed by your doctor  Aim for 30 mins of physical activity daily  Bring food record and glucose log to your next nutrition visit

## 2020-04-27 NOTE — Progress Notes (Signed)
Diabetes Self-Management Education  Visit Type:  First/Initial  Appt. Start Time: 2:11 Appt. End Time: 3:30  05/02/2020  [name], identified by name and date of birth, is a 50 y.o. female with a diagnosis of Diabetes: Type 2.   ASSESSMENT  Pt expectations: better ways to eat, appropriate ways to prepare food, appropriate times to eat  Translator leaves 30 minutes after the appt starts at 2:42 pm stating she has to be at another appt at 2:45pm and another location. Pt's daughter continues to translate for pt.   Arrives with daughter. States she checks her 1-2x/day: FBS (59-284) and after meals (150-312). Reports eating her first meal 8:30-9 am. States she had low BS episodes about 2 weeks ago; included headaches. Reports hypoglycemic signs/symptoms occur about 4 times/week. Responds by drinking juice and then will eat breakfast afterwards. Reports hyperglycemic sign/synptoms 2x/week; sometimes after lunch.   States she had recently temporarily stopped taking most medications while taking antibiotics for kidney infection. Will see PCP in 3 weeks. States she has glaucoma. States she lives with husband, son, and cousin.    Last menstrual period 08/21/2016. There is no height or weight on file to calculate BMI.    Diabetes Self-Management Education - 04/27/20 1419      Health Coping   How would you rate your overall health? Poor      Psychosocial Assessment   Patient Belief/Attitude about Diabetes Defeat/Burnout   Also feels afraid and in denial   Self-care barriers English as a second language    Self-management support Family    Patient Concerns Nutrition/Meal planning;Glycemic Control    Special Needs Simplified materials    Preferred Learning Style No preference indicated    Learning Readiness Ready      Pre-Education Assessment   Patient understands the diabetes disease and treatment process. --    Patient understands incorporating nutritional management into  lifestyle. --    Patient undertands incorporating physical activity into lifestyle. --    Patient understands using medications safely. --    Patient understands monitoring blood glucose, interpreting and using results --    Patient understands prevention, detection, and treatment of acute complications. --    Patient understands prevention, detection, and treatment of chronic complications. --    Patient understands how to develop strategies to address psychosocial issues. --    Patient understands how to develop strategies to promote health/change behavior. --      Complications   Last HgB A1C per patient/outside source --   >14.0   How often do you check your blood sugar? 1-2 times/day    Fasting Blood glucose range (mg/dL) <78;29-562;130-865;784-696;>295    Number of hypoglycemic episodes per month 10    Can you tell when your blood sugar is low? Yes    What do you do if your blood sugar is low? drink juice then eat breakfast    Number of hyperglycemic episodes per week 2    Can you tell when your blood sugar is high? Yes    What do you do if your blood sugar is high? takes 30 units of insulin    Have you had a dilated eye exam in the past 12 months? Yes    Have you had a dental exam in the past 12 months? No    Are you checking your feet? Yes    How many days per week are you checking your feet? 3      Dietary Intake   Breakfast  grilled fish + boiled egg + slice of bread + 1/2 c orange juice    Lunch okra + tortilla + salad (vegetables only)    Snack (afternoon) sometimes banana    Dinner 9 pm - 2 slices of pizza (chicken, cheese)    Beverage(s) orange juice, water (4-5*16 oz; 64+ oz)      Exercise   Exercise Type Other (comment)   biking, walking   How many days per week to you exercise? 5    How many minutes per day do you exercise? 10    Total minutes per week of exercise 50      Patient Education   Previous Diabetes Education No    Disease state  Definition of  diabetes, type 1 and 2, and the diagnosis of diabetes;Factors that contribute to the development of diabetes    Nutrition management  Role of diet in the treatment of diabetes and the relationship between the three main macronutrients and blood glucose level;Food label reading, portion sizes and measuring food.;Reviewed blood glucose goals for pre and post meals and how to evaluate the patients' food intake on their blood glucose level.;Meal options for control of blood glucose level and chronic complications.    Physical activity and exercise  Role of exercise on diabetes management, blood pressure control and cardiac health.    Monitoring Purpose and frequency of SMBG.;Interpreting lab values - A1C, lipid, urine microalbumina.;Identified appropriate SMBG and/or A1C goals.;Daily foot exams;Yearly dilated eye exam    Acute complications Taught treatment of hypoglycemia - the 15 rule.;Discussed and identified patients' treatment of hyperglycemia.    Chronic complications Applicable immunizations;Assessed and discussed foot care and prevention of foot problems;Lipid levels, blood glucose control and heart disease;Relationship between chronic complications and blood glucose control;Retinopathy and reason for yearly dilated eye exams;Reviewed with patient heart disease, higher risk of, and prevention      Individualized Goals (developed by patient)   Nutrition Follow meal plan discussed    Physical Activity Exercise 3-5 times per week;15 minutes per day;30 minutes per day    Medications take my medication as prescribed    Monitoring  test my blood glucose as discussed    Reducing Risk examine blood glucose patterns;do foot checks daily;increase portions of nuts and seeds;treat hypoglycemia with 15 grams of carbs if blood glucose less than 70mg /dL      Post-Education Assessment   Patient understands the diabetes disease and treatment process. Demonstrates understanding / competency    Patient understands  incorporating nutritional management into lifestyle. Demonstrates understanding / competency    Patient undertands incorporating physical activity into lifestyle. Needs Review    Patient understands using medications safely. Needs Review    Patient understands monitoring blood glucose, interpreting and using results Demonstrates understanding / competency    Patient understands prevention, detection, and treatment of acute complications. Demonstrates understanding / competency    Patient understands prevention, detection, and treatment of chronic complications. Needs Review    Patient understands how to develop strategies to address psychosocial issues. Needs Review    Patient understands how to develop strategies to promote health/change behavior. Demonstrates understanding / competency      Outcomes   Program Status Not Completed           Learning Objective:  Patient will have a greater understanding of diabetes self-management. Patient education plan is to attend individual and/or group sessions per assessed needs and concerns.   Plan:   Patient Instructions  Goals:  Follow Diabetes Meal Plan  as instructed  Eat 3 meals and 2 snacks, every 3-5 hrs  Limit carbohydrate intake to 30-45 grams carbohydrate/meal  Include 1/2 plate of non-starchy vegetables + 1/4 plate of lean protein + 1/4 plate of starch/grain.   Limit carbohydrate intake to 15-30 grams carbohydrate/snack  Have 1 source carboydrates + protein  Ex. Fruit and peanut butter  Ex. Crackers and deli meat  Add lean protein foods to meals/snacks  Monitor glucose levels as instructed by your doctor  Aim for 30 mins of physical activity daily  Bring food record and glucose log to your next nutrition visit     Expected Outcomes:  Demonstrated interest in learning. Expect positive outcomes  Education material provided: ADA - How to Thrive: A Guide for Your Journey with Diabetes  If problems or questions,  patient to contact team via:  Phone and Email  Future DSME appointment: - 4-6 wks

## 2020-04-28 ENCOUNTER — Emergency Department (HOSPITAL_COMMUNITY)
Admission: EM | Admit: 2020-04-28 | Discharge: 2020-04-29 | Disposition: A | Discharge status: Home / Self Care | Payer: Medicaid Other | Attending: Emergency Medicine | Admitting: Emergency Medicine

## 2020-04-28 ENCOUNTER — Emergency Department (HOSPITAL_COMMUNITY): Payer: Medicaid Other

## 2020-04-28 DIAGNOSIS — S8291XA Unspecified fracture of right lower leg, initial encounter for closed fracture: Secondary | ICD-10-CM | POA: Insufficient documentation

## 2020-04-28 DIAGNOSIS — M25579 Pain in unspecified ankle and joints of unspecified foot: Secondary | ICD-10-CM

## 2020-04-28 DIAGNOSIS — S0990XA Unspecified injury of head, initial encounter: Secondary | ICD-10-CM | POA: Diagnosis not present

## 2020-04-28 DIAGNOSIS — S82891A Other fracture of right lower leg, initial encounter for closed fracture: Secondary | ICD-10-CM

## 2020-04-28 DIAGNOSIS — Y92000 Kitchen of unspecified non-institutional (private) residence as  the place of occurrence of the external cause: Secondary | ICD-10-CM | POA: Diagnosis not present

## 2020-04-28 DIAGNOSIS — S99911A Unspecified injury of right ankle, initial encounter: Secondary | ICD-10-CM | POA: Diagnosis present

## 2020-04-28 DIAGNOSIS — W010XXA Fall on same level from slipping, tripping and stumbling without subsequent striking against object, initial encounter: Secondary | ICD-10-CM | POA: Diagnosis not present

## 2020-04-28 LAB — PROTIME-INR
INR: 1 (ref 0.8–1.2)
Prothrombin Time: 13.2 seconds (ref 11.4–15.2)

## 2020-04-28 LAB — CBC
HCT: 33.1 % — ABNORMAL LOW (ref 36.0–46.0)
Hemoglobin: 9.6 g/dL — ABNORMAL LOW (ref 12.0–15.0)
MCH: 25.4 pg — ABNORMAL LOW (ref 26.0–34.0)
MCHC: 29 g/dL — ABNORMAL LOW (ref 30.0–36.0)
MCV: 87.6 fL (ref 80.0–100.0)
Platelets: 197 10*3/uL (ref 150–400)
RBC: 3.78 MIL/uL — ABNORMAL LOW (ref 3.87–5.11)
RDW: 13.8 % (ref 11.5–15.5)
WBC: 6.1 10*3/uL (ref 4.0–10.5)
nRBC: 0 % (ref 0.0–0.2)

## 2020-04-28 MED ORDER — HYDROMORPHONE HCL 1 MG/ML IJ SOLN: 1.0000 mg | Freq: Once | INTRAMUSCULAR | Status: DC

## 2020-04-28 NOTE — ED Provider Notes (Addendum)
[facility] EMERGENCY DEPARTMENT Provider Note   CSN: [id number] Arrival date & time: 04/28/20  2248     History Chief Complaint  Patient presents with   Trauma   Fall    Jennifer [name] is a 50 y.o. female.  50 year old female who speaks Arabic, apparently it is an abnormal dialect, that presents to the emergency room today secondary to a fall.  Patient apparently tripped over something in the house and hurt her right ankle with obvious edema to the area.  EMS called splinted brought her here for further evaluation had 100 mcg of fentanyl in route.  No other obvious injuries.  Is on some type of blood thinner for her irregular heartbeat but is unclear of the name. Is on some type of blood thinner for irregular heart rate but not sure which one.     No past medical history on file.  There are no problems to display for this patient.   OB History   No obstetric history on file.     No family history on file.     Home Medications Prior to Admission medications   Medication Sig Start Date End Date Taking? Authorizing Provider  oxyCODONE-acetaminophen (PERCOCET) 5-325 MG tablet Take 1-2 tablets by mouth every 6 (six) hours as needed for severe pain. 04/29/20  Yes Vista Sawatzky, Corene Cornea, MD    Allergies    Patient has no allergy information on record.  Review of Systems   Review of Systems  All other systems reviewed and are negative.   Physical Exam Updated Vital Signs BP (!) 167/80    Pulse 79    Temp 98.1 F (36.7 C) (Oral)    Resp 19    Ht 5\' 6"  (1.676 m)    Wt 99.3 kg    SpO2 96%    BMI 35.35 kg/m   Physical Exam Vitals and nursing note reviewed.  Constitutional:      Appearance: She is well-developed and well-nourished.  HENT:     Head: Normocephalic and atraumatic.     Mouth/Throat:     Mouth: Mucous membranes are moist.  Cardiovascular:     Rate and Rhythm: Normal rate and regular rhythm.  Pulmonary:     Effort: No respiratory distress.      Breath sounds: No stridor.  Abdominal:     General: Abdomen is flat. There is no distension.  Musculoskeletal:        General: Swelling, tenderness and signs of injury (right ankle) present.     Cervical back: Normal range of motion.  Skin:    General: Skin is warm and dry.  Neurological:     General: No focal deficit present.     Mental Status: She is alert.     ED Results / Procedures / Treatments   Labs (all labs ordered are listed, but only abnormal results are displayed) Labs Reviewed  COMPREHENSIVE METABOLIC PANEL - Abnormal; Notable for the following components:      Result Value   Glucose, Bld 279 (*)    Creatinine, Ser 1.13 (*)    Albumin 2.9 (*)    Alkaline Phosphatase 143 (*)    GFR, Estimated 60 (*)    All other components within normal limits  CBC - Abnormal; Notable for the following components:   RBC 3.78 (*)    Hemoglobin 9.6 (*)    HCT 33.1 (*)    MCH 25.4 (*)    MCHC 29.0 (*)    All other components  within normal limits  I-STAT CHEM 8, ED - Abnormal; Notable for the following components:   BUN 21 (*)    Creatinine, Ser 1.10 (*)    Glucose, Bld 280 (*)    Hemoglobin 10.5 (*)    HCT 31.0 (*)    All other components within normal limits  ETHANOL  PROTIME-INR  URINALYSIS, ROUTINE W REFLEX MICROSCOPIC  LACTIC ACID, PLASMA  SAMPLE TO BLOOD BANK    EKG None  Radiology CT HEAD WO CONTRAST  Result Date: 04/28/2020 CLINICAL DATA:  Neuro deficit, acute, stroke suspected Fall in the kitchen striking back of head on tile floor. EXAM: CT HEAD WITHOUT CONTRAST TECHNIQUE: Contiguous axial images were obtained from the base of the skull through the vertex without intravenous contrast. COMPARISON:  Head CT 03/08/2020 FINDINGS: Brain: Similar degree of atrophy and chronic small vessel ischemia. No intracranial hemorrhage, mass effect, or midline shift. No hydrocephalus. The basilar cisterns are patent. No evidence of territorial infarct or acute ischemia. No  extra-axial or intracranial fluid collection. Vascular: No hyperdense vessel. Skull: Normal. Negative for fracture or focal lesion. Sinuses/Orbits: Paranasal sinuses and mastoid air cells are clear. The visualized orbits are unremarkable. Right cataract resection. Small lens on the left, chronic. Other: No significant scalp hematoma. IMPRESSION: 1. No acute intracranial abnormality. No skull fracture. 2. Unchanged mild atrophy and chronic small vessel ischemia. Electronically Signed   By: Keith Rake M.D.   On: 04/28/2020 23:48   CT CERVICAL SPINE WO CONTRAST  Result Date: 04/28/2020 CLINICAL DATA:  Neck trauma, dangerous injury mechanism (Age 39-64y) Fall in the kitchen jerking head. EXAM: CT CERVICAL SPINE WITHOUT CONTRAST TECHNIQUE: Multidetector CT imaging of the cervical spine was performed without intravenous contrast. Multiplanar CT image reconstructions were also generated. COMPARISON:  03/08/2020 FINDINGS: Alignment: Normal. Skull base and vertebrae: No acute fracture. Vertebral body heights are maintained. The dens and skull base are intact. Soft tissues and spinal canal: No prevertebral fluid or swelling. No visible canal hematoma. Disc levels: Minor endplate spurring with preservation of disc spaces. Upper chest: Similar interstitial prominence to prior. No acute findings. Other: Subcentimeter left thyroid nodule. Not clinically significant; no follow-up imaging recommended (ref: J Am Coll Radiol. 2015 Feb;12(2): 143-50). IMPRESSION: No fracture or subluxation of the cervical spine. Electronically Signed   By: Keith Rake M.D.   On: 04/28/2020 23:51   CT Ankle Right Wo Contrast  Result Date: 04/29/2020 CLINICAL DATA:  Ankle fracture. EXAM: CT OF THE RIGHT ANKLE WITHOUT CONTRAST TECHNIQUE: Multidetector CT imaging of the right ankle was performed according to the standard protocol. Multiplanar CT image reconstructions were also generated. COMPARISON:  Radiographs yesterday. FINDINGS:  Bones/Joint/Cartilage Improved alignment compared to prior radiographs with resolved talar tilt and mortise disruption. Comminuted distal fibular fracture at and just proximal to the ankle mortise. Multiple small fracture fragments at the fracture site. There is a transverse fracture through the anterior tibia and medial malleolus. Comminuted posterior tibial tubercle fracture with small fracture fragments at the articular surface. Articular step-off is 3 mm. Ligaments Suboptimally assessed by CT. Small fracture fragments in the region of the anterior talofibular ligament fibular insertion. Muscles and Tendons Intact Achilles tendon. No definite perennial or flexor tendon entrapment. Soft tissues Generalized soft tissue edema about the ankle. There are vascular calcifications. IMPRESSION: Trimalleolar fracture in improved alignment compared to prior imaging. Electronically Signed   By: Keith Rake M.D.   On: 04/29/2020 01:33   DG Ankle Right Port  Result Date: 04/28/2020 CLINICAL DATA:  Fall with right ankle deformity. EXAM: PORTABLE RIGHT ANKLE - 2 VIEW COMPARISON:  Ankle radiograph 09/17/2019 FINDINGS: Displaced distal fibular fracture proximal to the ankle mortise with osseous overriding. There is lateral subluxation of the talus with respect to the tibial plafond and. Disruption of the ankle mortise. Transverse medial malleolus fracture with distal fracture fragment remaining aligned with the talus. Possible posterior tibial tubercle fracture. Plantar calcaneal spur. There is generalized soft tissue edema. IMPRESSION: 1. Displaced distal fibular fracture with osseous overriding. 2. Lateral displacement of the talus. Displaced medial malleolus fracture with disruption of the ankle mortise. Possible posterior tibial tubercle fracture. Findings are suspicious for trimalleolar fracture. Electronically Signed   By: Keith Rake M.D.   On: 04/28/2020 23:53    Procedures Procedures (including critical  care time)  Medications Ordered in ED Medications  HYDROmorphone (DILAUDID) injection 1 mg (has no administration in time range)  oxyCODONE-acetaminophen (PERCOCET/ROXICET) 5-325 MG per tablet 1 tablet (has no administration in time range)    ED Course  I have reviewed the triage vital signs and the nursing notes.  Pertinent labs & imaging results that were available during my care of the patient were reviewed by me and considered in my medical decision making (see chart for details).    MDM Rules/Calculators/A&P                         eval for traumatic injury, suspect at least ankle injury of some sort.  Trimalleolar fracture. Ct scan complete. Splinted. Crutches provided. D/w Dr. Griffin Basil, will see in office on Tuesday. Short course of pain meds prescribed. Stable for discharge.  Final Clinical Impression(s) / ED Diagnoses Final diagnoses:  Ankle pain  Closed fracture of right ankle, initial encounter    Rx / DC Orders ED Discharge Orders         Ordered    oxyCODONE-acetaminophen (PERCOCET) 5-325 MG tablet  Every 6 hours PRN        04/29/20 0325           Ason Heslin, Corene Cornea, MD 04/29/20 0327    Merrily Pew, MD 05/07/20 1114

## 2020-04-28 NOTE — Progress Notes (Signed)
Orthopedic Tech Progress Note Patient Details:  Jennifer [name] December 28, 1970 [id number] Level 2 Trauma  Patient ID: [name], female   DOB: 10-15-1970, 50 y.o.   MRN: [id number]   Jearld Lesch 04/28/2020, 11:27 PM

## 2020-04-28 NOTE — ED Triage Notes (Addendum)
Pt arrives to ED BIB [facility] as a Level 2 Trauma Fall on Blood Thinners (Eliquis). Per EMS pt tripped on a rug and fell hitting her head on the floor (No LOC).Pt has an obvious deformity to the RT ankle. Pedal pulses present. 172mcg Fentanyl total was administered by EMS in route. C-Collar present and ankle stabilized Pt A/O x4. EDP at bedside upon pt's arrival.  BP 138/76 HR 82 R 16 O2 98% RA CBG 313

## 2020-04-29 ENCOUNTER — Emergency Department (HOSPITAL_COMMUNITY): Payer: Medicaid Other

## 2020-04-29 ENCOUNTER — Other Ambulatory Visit: Payer: Self-pay

## 2020-04-29 LAB — COMPREHENSIVE METABOLIC PANEL
ALT: 14 U/L (ref 0–44)
AST: 15 U/L (ref 15–41)
Albumin: 2.9 g/dL — ABNORMAL LOW (ref 3.5–5.0)
Alkaline Phosphatase: 143 U/L — ABNORMAL HIGH (ref 38–126)
Anion gap: 10 (ref 5–15)
BUN: 19 mg/dL (ref 6–20)
CO2: 24 mmol/L (ref 22–32)
Calcium: 9.1 mg/dL (ref 8.9–10.3)
Chloride: 104 mmol/L (ref 98–111)
Creatinine, Ser: 1.13 mg/dL — ABNORMAL HIGH (ref 0.44–1.00)
GFR, Estimated: 60 mL/min — ABNORMAL LOW (ref >60)
Glucose, Bld: 279 mg/dL — ABNORMAL HIGH (ref 70–99)
Potassium: 3.9 mmol/L (ref 3.5–5.1)
Sodium: 138 mmol/L (ref 135–145)
Total Bilirubin: 0.6 mg/dL (ref 0.3–1.2)
Total Protein: 7.3 g/dL (ref 6.5–8.1)

## 2020-04-29 LAB — I-STAT CHEM 8, ED
BUN: 21 mg/dL — ABNORMAL HIGH (ref 6–20)
Calcium, Ion: 1.21 mmol/L (ref 1.15–1.40)
Chloride: 103 mmol/L (ref 98–111)
Creatinine, Ser: 1.1 mg/dL — ABNORMAL HIGH (ref 0.44–1.00)
Glucose, Bld: 280 mg/dL — ABNORMAL HIGH (ref 70–99)
HCT: 31 % — ABNORMAL LOW (ref 36.0–46.0)
Hemoglobin: 10.5 g/dL — ABNORMAL LOW (ref 12.0–15.0)
Potassium: 3.9 mmol/L (ref 3.5–5.1)
Sodium: 141 mmol/L (ref 135–145)
TCO2: 27 mmol/L (ref 22–32)

## 2020-04-29 LAB — ETHANOL: Alcohol, Ethyl (B): <10 mg/dL (ref <10)

## 2020-04-29 MED ORDER — OXYCODONE-ACETAMINOPHEN 5-325 MG PO TABS: 1.0000 | ORAL_TABLET | Freq: Four times a day (QID) | ORAL | 0 refills | Status: DC | PRN

## 2020-04-29 MED ORDER — OXYCODONE-ACETAMINOPHEN 5-325 MG PO TABS
1.0000 | ORAL_TABLET | Freq: Once | ORAL | Status: AC
  Administered 2020-04-29: 1 via ORAL
  Filled 2020-04-29: qty 1

## 2020-04-29 NOTE — Progress Notes (Signed)
Orthopedic Tech Progress Note Patient Details:  Jennifer [name] Nov 05, 1970 [id number]  Ortho Devices Type of Ortho Device: Post (short leg) splint Ortho Device/Splint Location: RLE Ortho Device/Splint Interventions: Application       Jennifer [name] 04/29/2020, 3:07 AM

## 2020-05-01 ENCOUNTER — Encounter: Payer: Self-pay | Admitting: Registered"

## 2020-05-02 ENCOUNTER — Ambulatory Visit: Payer: Self-pay | Admitting: Student

## 2020-05-02 DIAGNOSIS — S82851A Displaced trimalleolar fracture of right lower leg, initial encounter for closed fracture: Secondary | ICD-10-CM | POA: Insufficient documentation

## 2020-05-02 HISTORY — DX: Displaced trimalleolar fracture of right lower leg, initial encounter for closed fracture: S82.851A

## 2020-05-03 ENCOUNTER — Encounter (HOSPITAL_COMMUNITY): Payer: Self-pay | Admitting: Student

## 2020-05-03 ENCOUNTER — Inpatient Hospital Stay (HOSPITAL_COMMUNITY): Admission: RE | Admit: 2020-05-03 | Payer: Medicaid Other | Source: Ambulatory Visit

## 2020-05-03 ENCOUNTER — Encounter: Payer: Self-pay | Admitting: Gastroenterology

## 2020-05-03 NOTE — Progress Notes (Signed)
Patient speaks Arabic, used [employer] #  [phone] [employer] and Daughter Financial controller for PAT information (3 way call).    PCP - [facility] Cardiologist - n/a Ophthalmology - Dr Dwana Melena  Chest x-ray - 04/08/20 (1V) EKG - 03/09/20 Stress Test - n/a ECHO - 03/16/20 Cardiac Cath - n/a  Last dose eliquis was 3 wks ago in 03/2020 per patient.  Fasting Blood Sugar - 140-240s Checks Blood Sugar 3 times a day  . Do not take oral diabetes medicines (metformin) the morning of surgery.  . THE NIGHT BEFORE SURGERY, take 7 units Novolin N Insulin.     . THE MORNING OF SURGERY, take 15 units Novolin N Insulin   . If your blood sugar is less than 70 mg/dL, you will need to treat for low blood sugar: o Treat a low blood sugar (less than 70 mg/dL) with  cup of clear juice (cranberry or apple), 4 glucose tablets, OR glucose gel. o Recheck blood sugar in 15 minutes after treatment (to make sure it is greater than 70 mg/dL). If your blood sugar is not greater than 70 mg/dL on recheck, call [phone] for further instructions.  ERAS: Clear liquids til 6:50 am DOS, no drink.  Anesthesia review: Yes  STOP now taking any Aspirin (unless otherwise instructed by your surgeon), Aleve, Naproxen, Ibuprofen, Motrin, Advil, Goody's, BC's, all herbal medications, fish oil, and all vitamins.   Coronavirus Screening Covid test is scheduled on  05/04/20 @ 1pm Do you have any of the following symptoms:  Cough slight cough Fever (>100.77F)  yes/no: No Runny nose yes/no: No Sore throat yes/no: No Difficulty breathing/shortness of breath  yes/no: No  Have you traveled in the last 14 days and where? yes/no: No  Patient verbalized understanding of instructions that were given via phone.

## 2020-05-04 ENCOUNTER — Other Ambulatory Visit (HOSPITAL_COMMUNITY)
Admission: RE | Admit: 2020-05-04 | Discharge: 2020-05-04 | Disposition: A | Discharge status: Home / Self Care | Payer: Medicaid Other | Source: Ambulatory Visit | Attending: Student | Admitting: Student

## 2020-05-04 DIAGNOSIS — Z01812 Encounter for preprocedural laboratory examination: Secondary | ICD-10-CM | POA: Insufficient documentation

## 2020-05-04 DIAGNOSIS — Z20822 Contact with and (suspected) exposure to covid-19: Secondary | ICD-10-CM | POA: Diagnosis not present

## 2020-05-04 LAB — SARS CORONAVIRUS 2 (TAT 6-24 HRS): SARS Coronavirus 2: NEGATIVE

## 2020-05-05 ENCOUNTER — Ambulatory Visit (HOSPITAL_COMMUNITY): Payer: Medicaid Other | Admitting: Physician Assistant

## 2020-05-05 ENCOUNTER — Encounter (HOSPITAL_COMMUNITY)
Admission: RE | Disposition: A | Discharge status: Home / Self Care | Payer: Self-pay | Source: Home / Self Care | Attending: Student

## 2020-05-05 ENCOUNTER — Other Ambulatory Visit: Payer: Self-pay | Admitting: Internal Medicine

## 2020-05-05 ENCOUNTER — Encounter (HOSPITAL_COMMUNITY): Payer: Self-pay | Admitting: Student

## 2020-05-05 ENCOUNTER — Ambulatory Visit (HOSPITAL_COMMUNITY): Payer: Medicaid Other

## 2020-05-05 ENCOUNTER — Other Ambulatory Visit: Payer: Self-pay

## 2020-05-05 ENCOUNTER — Ambulatory Visit (HOSPITAL_COMMUNITY)
Admission: RE | Admit: 2020-05-05 | Discharge: 2020-05-05 | Disposition: A | Discharge status: Home / Self Care | Payer: Medicaid Other | Attending: Student | Admitting: Student

## 2020-05-05 DIAGNOSIS — T148XXA Other injury of unspecified body region, initial encounter: Secondary | ICD-10-CM

## 2020-05-05 DIAGNOSIS — Z7901 Long term (current) use of anticoagulants: Secondary | ICD-10-CM | POA: Insufficient documentation

## 2020-05-05 DIAGNOSIS — E1139 Type 2 diabetes mellitus with other diabetic ophthalmic complication: Secondary | ICD-10-CM | POA: Diagnosis not present

## 2020-05-05 DIAGNOSIS — Z833 Family history of diabetes mellitus: Secondary | ICD-10-CM | POA: Diagnosis not present

## 2020-05-05 DIAGNOSIS — I4891 Unspecified atrial fibrillation: Secondary | ICD-10-CM | POA: Diagnosis not present

## 2020-05-05 DIAGNOSIS — Z794 Long term (current) use of insulin: Secondary | ICD-10-CM | POA: Insufficient documentation

## 2020-05-05 DIAGNOSIS — Y939 Activity, unspecified: Secondary | ICD-10-CM | POA: Diagnosis not present

## 2020-05-05 DIAGNOSIS — S93431A Sprain of tibiofibular ligament of right ankle, initial encounter: Secondary | ICD-10-CM | POA: Insufficient documentation

## 2020-05-05 DIAGNOSIS — T1490XA Injury, unspecified, initial encounter: Secondary | ICD-10-CM

## 2020-05-05 DIAGNOSIS — H42 Glaucoma in diseases classified elsewhere: Secondary | ICD-10-CM | POA: Diagnosis not present

## 2020-05-05 DIAGNOSIS — S82851A Displaced trimalleolar fracture of right lower leg, initial encounter for closed fracture: Secondary | ICD-10-CM | POA: Insufficient documentation

## 2020-05-05 DIAGNOSIS — Z8249 Family history of ischemic heart disease and other diseases of the circulatory system: Secondary | ICD-10-CM | POA: Diagnosis not present

## 2020-05-05 DIAGNOSIS — W19XXXA Unspecified fall, initial encounter: Secondary | ICD-10-CM | POA: Diagnosis not present

## 2020-05-05 HISTORY — PX: ORIF ANKLE FRACTURE: SHX5408

## 2020-05-05 HISTORY — DX: Depression, unspecified: F32.A

## 2020-05-05 LAB — CBC
HCT: 29.2 % — ABNORMAL LOW (ref 36.0–46.0)
Hemoglobin: 8.5 g/dL — ABNORMAL LOW (ref 12.0–15.0)
MCH: 25 pg — ABNORMAL LOW (ref 26.0–34.0)
MCHC: 29.1 g/dL — ABNORMAL LOW (ref 30.0–36.0)
MCV: 85.9 fL (ref 80.0–100.0)
Platelets: 298 10*3/uL (ref 150–400)
RBC: 3.4 MIL/uL — ABNORMAL LOW (ref 3.87–5.11)
RDW: 13.7 % (ref 11.5–15.5)
WBC: 6.4 10*3/uL (ref 4.0–10.5)
nRBC: 0 % (ref 0.0–0.2)

## 2020-05-05 LAB — BASIC METABOLIC PANEL
Anion gap: 11 (ref 5–15)
BUN: 17 mg/dL (ref 6–20)
CO2: 24 mmol/L (ref 22–32)
Calcium: 9.2 mg/dL (ref 8.9–10.3)
Chloride: 101 mmol/L (ref 98–111)
Creatinine, Ser: 1.05 mg/dL — ABNORMAL HIGH (ref 0.44–1.00)
GFR, Estimated: >60 mL/min (ref >60)
Glucose, Bld: 142 mg/dL — ABNORMAL HIGH (ref 70–99)
Potassium: 3.7 mmol/L (ref 3.5–5.1)
Sodium: 136 mmol/L (ref 135–145)

## 2020-05-05 LAB — GLUCOSE, CAPILLARY: Glucose-Capillary: 136 mg/dL — ABNORMAL HIGH (ref 70–99)

## 2020-05-05 LAB — SURGICAL PCR SCREEN
MRSA, PCR: NEGATIVE
Staphylococcus aureus: NEGATIVE

## 2020-05-05 SURGERY — OPEN REDUCTION INTERNAL FIXATION (ORIF) ANKLE FRACTURE
Anesthesia: Regional | Site: Ankle | Laterality: Right

## 2020-05-05 MED ORDER — ONDANSETRON HCL 4 MG/2ML IJ SOLN
INTRAMUSCULAR | Status: AC
  Filled 2020-05-05: qty 2

## 2020-05-05 MED ORDER — CEFAZOLIN SODIUM-DEXTROSE 2-4 GM/100ML-% IV SOLN
2.0000 g | INTRAVENOUS | Status: AC
  Administered 2020-05-05: 2 g via INTRAVENOUS
  Filled 2020-05-05: qty 100

## 2020-05-05 MED ORDER — ROPIVACAINE HCL 5 MG/ML IJ SOLN
INTRAMUSCULAR | Status: DC | PRN
  Administered 2020-05-05: 40 mL via PERINEURAL

## 2020-05-05 MED ORDER — PROPOFOL 10 MG/ML IV BOLUS
INTRAVENOUS | Status: DC | PRN
  Administered 2020-05-05: 150 mg via INTRAVENOUS

## 2020-05-05 MED ORDER — MIDAZOLAM HCL 2 MG/2ML IJ SOLN
INTRAMUSCULAR | Status: AC
  Filled 2020-05-05: qty 2

## 2020-05-05 MED ORDER — GLYCOPYRROLATE PF 0.2 MG/ML IJ SOSY
PREFILLED_SYRINGE | INTRAMUSCULAR | Status: DC | PRN
  Administered 2020-05-05: .1 mg via INTRAVENOUS

## 2020-05-05 MED ORDER — MIDAZOLAM HCL 2 MG/2ML IJ SOLN: 1.0000 mg | Freq: Once | INTRAMUSCULAR | Status: AC

## 2020-05-05 MED ORDER — EPHEDRINE SULFATE-NACL 50-0.9 MG/10ML-% IV SOSY
PREFILLED_SYRINGE | INTRAVENOUS | Status: DC | PRN
  Administered 2020-05-05: 5 mg via INTRAVENOUS
  Administered 2020-05-05 (×3): 10 mg via INTRAVENOUS

## 2020-05-05 MED ORDER — LACTATED RINGERS IV SOLN: INTRAVENOUS | Status: DC

## 2020-05-05 MED ORDER — STERILE WATER FOR IRRIGATION IR SOLN
Status: DC | PRN
  Administered 2020-05-05: 1000 mL

## 2020-05-05 MED ORDER — VANCOMYCIN HCL 1000 MG IV SOLR
INTRAVENOUS | Status: AC
  Filled 2020-05-05: qty 1000

## 2020-05-05 MED ORDER — MIDAZOLAM HCL 2 MG/2ML IJ SOLN
INTRAMUSCULAR | Status: AC
  Administered 2020-05-05: 1 mg via INTRAVENOUS
  Filled 2020-05-05: qty 2

## 2020-05-05 MED ORDER — ACETAMINOPHEN 500 MG PO TABS
1000.0000 mg | ORAL_TABLET | Freq: Once | ORAL | Status: AC
  Administered 2020-05-05: 1000 mg via ORAL
  Filled 2020-05-05: qty 2

## 2020-05-05 MED ORDER — FENTANYL CITRATE (PF) 250 MCG/5ML IJ SOLN
INTRAMUSCULAR | Status: DC | PRN
  Administered 2020-05-05: 50 ug via INTRAVENOUS

## 2020-05-05 MED ORDER — PHENYLEPHRINE 40 MCG/ML (10ML) SYRINGE FOR IV PUSH (FOR BLOOD PRESSURE SUPPORT)
PREFILLED_SYRINGE | INTRAVENOUS | Status: AC
  Filled 2020-05-05: qty 10

## 2020-05-05 MED ORDER — LIDOCAINE 2% (20 MG/ML) 5 ML SYRINGE
INTRAMUSCULAR | Status: AC
  Filled 2020-05-05: qty 5

## 2020-05-05 MED ORDER — PHENYLEPHRINE 40 MCG/ML (10ML) SYRINGE FOR IV PUSH (FOR BLOOD PRESSURE SUPPORT)
PREFILLED_SYRINGE | INTRAVENOUS | Status: DC | PRN
  Administered 2020-05-05 (×3): 80 ug via INTRAVENOUS
  Administered 2020-05-05: 40 ug via INTRAVENOUS
  Administered 2020-05-05 (×3): 80 ug via INTRAVENOUS
  Administered 2020-05-05: 40 ug via INTRAVENOUS

## 2020-05-05 MED ORDER — DEXAMETHASONE SODIUM PHOSPHATE 10 MG/ML IJ SOLN
INTRAMUSCULAR | Status: DC | PRN
  Administered 2020-05-05: 10 mg

## 2020-05-05 MED ORDER — PROPOFOL 10 MG/ML IV BOLUS
INTRAVENOUS | Status: AC
  Filled 2020-05-05: qty 40

## 2020-05-05 MED ORDER — FENTANYL CITRATE (PF) 250 MCG/5ML IJ SOLN
INTRAMUSCULAR | Status: AC
  Filled 2020-05-05: qty 5

## 2020-05-05 MED ORDER — FENTANYL CITRATE (PF) 100 MCG/2ML IJ SOLN
INTRAMUSCULAR | Status: AC
  Filled 2020-05-05: qty 2

## 2020-05-05 MED ORDER — HYDROMORPHONE HCL 1 MG/ML IJ SOLN: 0.2500 mg | INTRAMUSCULAR | Status: DC | PRN

## 2020-05-05 MED ORDER — MIDAZOLAM HCL 2 MG/2ML IJ SOLN
INTRAMUSCULAR | Status: DC | PRN
  Administered 2020-05-05 (×2): 1 mg via INTRAVENOUS

## 2020-05-05 MED ORDER — ONDANSETRON HCL 4 MG/2ML IJ SOLN
INTRAMUSCULAR | Status: DC | PRN
  Administered 2020-05-05: 4 mg via INTRAVENOUS

## 2020-05-05 MED ORDER — PROMETHAZINE HCL 25 MG/ML IJ SOLN: 6.2500 mg | INTRAMUSCULAR | Status: DC | PRN

## 2020-05-05 MED ORDER — FENTANYL CITRATE (PF) 100 MCG/2ML IJ SOLN
50.0000 ug | Freq: Once | INTRAMUSCULAR | Status: AC
  Administered 2020-05-05: 50 ug via INTRAVENOUS

## 2020-05-05 MED ORDER — OXYCODONE HCL 5 MG/5ML PO SOLN: 5.0000 mg | Freq: Once | ORAL | Status: AC | PRN

## 2020-05-05 MED ORDER — OXYCODONE-ACETAMINOPHEN 5-325 MG PO TABS: 1.0000 | ORAL_TABLET | Freq: Four times a day (QID) | ORAL | 0 refills | Status: DC | PRN

## 2020-05-05 MED ORDER — 0.9 % SODIUM CHLORIDE (POUR BTL) OPTIME
TOPICAL | Status: DC | PRN
  Administered 2020-05-05: 1000 mL

## 2020-05-05 MED ORDER — LIDOCAINE 2% (20 MG/ML) 5 ML SYRINGE
INTRAMUSCULAR | Status: DC | PRN
  Administered 2020-05-05: 20 mg via INTRAVENOUS

## 2020-05-05 MED ORDER — VANCOMYCIN HCL 1000 MG IV SOLR
INTRAVENOUS | Status: DC | PRN
  Administered 2020-05-05: 1000 mg via TOPICAL

## 2020-05-05 MED ORDER — OXYCODONE HCL 5 MG PO TABS
5.0000 mg | ORAL_TABLET | Freq: Once | ORAL | Status: AC | PRN
Start: 2020-05-05 — End: 2020-05-05
  Administered 2020-05-05: 5 mg via ORAL

## 2020-05-05 MED ORDER — MEPERIDINE HCL 25 MG/ML IJ SOLN: 6.2500 mg | INTRAMUSCULAR | Status: DC | PRN

## 2020-05-05 MED ORDER — OXYCODONE HCL 5 MG PO TABS
ORAL_TABLET | ORAL | Status: AC
  Filled 2020-05-05: qty 1

## 2020-05-05 MED ORDER — CHLORHEXIDINE GLUCONATE 0.12 % MT SOLN
15.0000 mL | Freq: Once | OROMUCOSAL | Status: AC
  Administered 2020-05-05: 15 mL via OROMUCOSAL
  Filled 2020-05-05: qty 15

## 2020-05-05 MED ORDER — ORAL CARE MOUTH RINSE: 15.0000 mL | Freq: Once | OROMUCOSAL | Status: AC

## 2020-05-05 SURGICAL SUPPLY — 67 items
APL PRP STRL LF DISP 70% ISPRP (MISCELLANEOUS) ×2
BANDAGE ESMARK 6X9 LF (GAUZE/BANDAGES/DRESSINGS) ×1 IMPLANT
BIT DRILL QC 2.0 SHORT EVOS SM (DRILL) ×1 IMPLANT
BIT DRILL QC 2.5MM SHRT EVO SM (DRILL) IMPLANT
BNDG CMPR 9X6 STRL LF SNTH (GAUZE/BANDAGES/DRESSINGS) ×1
BNDG COHESIVE 4X5 TAN STRL (GAUZE/BANDAGES/DRESSINGS) ×2 IMPLANT
BNDG ELASTIC 4X5.8 VLCR STR LF (GAUZE/BANDAGES/DRESSINGS) ×2 IMPLANT
BNDG ELASTIC 6X5.8 VLCR STR LF (GAUZE/BANDAGES/DRESSINGS) IMPLANT
BNDG ESMARK 6X9 LF (GAUZE/BANDAGES/DRESSINGS) ×2
BRUSH SCRUB EZ PLAIN DRY (MISCELLANEOUS) ×2 IMPLANT
CHLORAPREP W/TINT 26 (MISCELLANEOUS) ×4 IMPLANT
COVER SURGICAL LIGHT HANDLE (MISCELLANEOUS) ×2 IMPLANT
COVER WAND RF STERILE (DRAPES) ×1 IMPLANT
DRAPE C-ARM 42X72 X-RAY (DRAPES) ×2 IMPLANT
DRAPE C-ARMOR (DRAPES) ×2 IMPLANT
DRAPE ORTHO SPLIT 77X108 STRL (DRAPES) ×4
DRAPE SURG ORHT 6 SPLT 77X108 (DRAPES) ×2 IMPLANT
DRAPE U-SHAPE 47X51 STRL (DRAPES) ×2 IMPLANT
DRILL QC 2.0 SHORT EVOS SM (DRILL) ×2
DRILL QC 2.5MM SHORT EVOS SM (DRILL) ×2
DRSG ADAPTIC 3X8 NADH LF (GAUZE/BANDAGES/DRESSINGS) IMPLANT
DRSG MEPITEL 4X7.2 (GAUZE/BANDAGES/DRESSINGS) ×2 IMPLANT
ELECT REM PT RETURN 9FT ADLT (ELECTROSURGICAL) ×2
ELECTRODE REM PT RTRN 9FT ADLT (ELECTROSURGICAL) ×1 IMPLANT
GAUZE SPONGE 4X4 12PLY STRL (GAUZE/BANDAGES/DRESSINGS) ×2 IMPLANT
GLOVE BIO SURGEON STRL SZ 6.5 (GLOVE) ×6 IMPLANT
GLOVE BIO SURGEON STRL SZ7.5 (GLOVE) ×6 IMPLANT
GLOVE BIOGEL PI IND STRL 6.5 (GLOVE) ×1 IMPLANT
GLOVE BIOGEL PI IND STRL 7.5 (GLOVE) ×1 IMPLANT
GLOVE BIOGEL PI INDICATOR 6.5 (GLOVE) ×1
GLOVE BIOGEL PI INDICATOR 7.5 (GLOVE) ×1
GOWN STRL REUS W/ TWL LRG LVL3 (GOWN DISPOSABLE) ×3 IMPLANT
GOWN STRL REUS W/TWL LRG LVL3 (GOWN DISPOSABLE) ×6
KIT INVISIKNOT ANKLE FRACTURE (Screw) ×1 IMPLANT
KIT TURNOVER KIT B (KITS) ×2 IMPLANT
MANIFOLD NEPTUNE II (INSTRUMENTS) ×2 IMPLANT
NEEDLE HYPO 21X1.5 SAFETY (NEEDLE) IMPLANT
NEEDLE HYPO 25GX1X1/2 BEV (NEEDLE) ×2 IMPLANT
NS IRRIG 1000ML POUR BTL (IV SOLUTION) ×2 IMPLANT
PACK TOTAL JOINT (CUSTOM PROCEDURE TRAY) ×2 IMPLANT
PAD ABD 8X10 STRL (GAUZE/BANDAGES/DRESSINGS) ×2 IMPLANT
PAD ARMBOARD 7.5X6 YLW CONV (MISCELLANEOUS) ×4 IMPLANT
PAD CAST 4YDX4 CTTN HI CHSV (CAST SUPPLIES) ×1 IMPLANT
PADDING CAST COTTON 4X4 STRL (CAST SUPPLIES) ×2
PADDING CAST COTTON 6X4 STRL (CAST SUPPLIES) IMPLANT
PLATE FIB EVOS 81X2.7/3.5 5H (Plate) ×2 IMPLANT
SCREW CORT 2.7X14 T8 EVOS (Screw) ×4 IMPLANT
SCREW CORT 2.7X15 T8 ST EVOS (Screw) ×2 IMPLANT
SCREW CORT 2.7X17 T8 ST EVOS (Screw) ×2 IMPLANT
SCREW CORT 3.5X11 ST EVOS (Screw) ×4 IMPLANT
SCREW CORT EVOS ST 3.5X12 (Screw) ×2 IMPLANT
SCREW CORT ST EVOS 3.5X55 (Screw) ×4 IMPLANT
SPONGE LAP 18X18 RF (DISPOSABLE) ×2 IMPLANT
STAPLER VISISTAT 35W (STAPLE) ×2 IMPLANT
SUCTION FRAZIER HANDLE 10FR (MISCELLANEOUS) ×2
SUCTION TUBE FRAZIER 10FR DISP (MISCELLANEOUS) ×1 IMPLANT
SUT ETHILON 3 0 PS 1 (SUTURE) ×4 IMPLANT
SUT PROLENE 0 CT (SUTURE) IMPLANT
SUT VIC AB 0 CT1 27 (SUTURE) ×2
SUT VIC AB 0 CT1 27XBRD ANBCTR (SUTURE) ×1 IMPLANT
SUT VIC AB 2-0 CT1 27 (SUTURE) ×4
SUT VIC AB 2-0 CT1 TAPERPNT 27 (SUTURE) ×2 IMPLANT
SYR CONTROL 10ML LL (SYRINGE) ×2 IMPLANT
TOWEL GREEN STERILE (TOWEL DISPOSABLE) ×2 IMPLANT
TOWEL GREEN STERILE FF (TOWEL DISPOSABLE) IMPLANT
UNDERPAD 30X36 HEAVY ABSORB (UNDERPADS AND DIAPERS) ×2 IMPLANT
WATER STERILE IRR 1000ML POUR (IV SOLUTION) ×2 IMPLANT

## 2020-05-05 NOTE — Anesthesia Procedure Notes (Addendum)
Anesthesia Regional Block: Popliteal block   Pre-Anesthetic Checklist: ,, timeout performed, Correct Patient, Correct Site, Correct Laterality, Correct Procedure, Correct Position, site marked, Risks and benefits discussed,  Surgical consent,  Pre-op evaluation,  At surgeon's request and post-op pain management  Laterality: Right  Prep: Maximum Sterile Barrier Precautions used, chloraprep       Needles:  Injection technique: Single-shot  Needle Type: Echogenic Stimulator Needle     Needle Length: 9cm  Needle Gauge: 22     Additional Needles:   Procedures:,,,, ultrasound used (permanent image in chart),,,,  Narrative:  Start time: 05/05/2020 8:50 AM End time: 05/05/2020 8:55 AM Injection made incrementally with aspirations every 5 mL.  Performed by: Personally  Anesthesiologist: Pervis Hocking, DO  Additional Notes: Monitors applied. No increased pain on injection. No increased resistance to injection. Injection made in 5cc increments. Good needle visualization. Patient tolerated procedure well.

## 2020-05-05 NOTE — Anesthesia Procedure Notes (Addendum)
Procedure Name: LMA Insertion Date/Time: 05/05/2020 9:49 AM Performed by: Hendricks Limes, CRNA Pre-anesthesia Checklist: Patient identified, Emergency Drugs available, Suction available, Patient being monitored and Timeout performed Patient Re-evaluated:Patient Re-evaluated prior to induction Oxygen Delivery Method: Circle system utilized Preoxygenation: Pre-oxygenation with 100% oxygen Induction Type: IV induction Ventilation: Mask ventilation without difficulty LMA: LMA inserted LMA Size: 4.0 Number of attempts: 1 Placement Confirmation: positive ETCO2 and breath sounds checked- equal and bilateral Tube secured with: Tape Dental Injury: Teeth and Oropharynx as per pre-operative assessment

## 2020-05-05 NOTE — Anesthesia Preprocedure Evaluation (Addendum)
Anesthesia Evaluation  Patient identified by MRN, date of birth, ID band Patient awake    Reviewed: Allergy & Precautions, NPO status , Patient's Chart, lab work & pertinent test results  Airway Mallampati: II  TM Distance: >3 FB Neck ROM: Full    Dental  (+) Poor Dentition, Missing, Chipped, Dental Advisory Given   Pulmonary neg pulmonary ROS,    Pulmonary exam normal breath sounds clear to auscultation       Cardiovascular hypertension, Pt. on medications Normal cardiovascular exam+ dysrhythmias (eliquis) Atrial Fibrillation  Rhythm:Regular Rate:Normal     Neuro/Psych  Headaches, PSYCHIATRIC DISORDERS Depression    GI/Hepatic negative GI ROS, Neg liver ROS,   Endo/Other  diabetes, Well Controlled, Type 2, Insulin Dependent, Oral Hypoglycemic AgentsObesity BMI 35  Renal/GU Renal InsufficiencyRenal diseaseCr 1.1  negative genitourinary   Musculoskeletal Closed trimalleolar fx right ankle    Abdominal (+) + obese,   Peds  Hematology  (+) Blood dyscrasia, anemia , hct 31   Anesthesia Other Findings Blind L eye   Reproductive/Obstetrics negative OB ROS                            Anesthesia Physical Anesthesia Plan  ASA: III  Anesthesia Plan: General and Regional   Post-op Pain Management: GA combined w/ Regional for post-op pain   Induction: Intravenous  PONV Risk Score and Plan: Ondansetron, Dexamethasone, Midazolam and Treatment may vary due to age or medical condition  Airway Management Planned: LMA  Additional Equipment: None  Intra-op Plan:   Post-operative Plan: Extubation in OR  Informed Consent: I have reviewed the patients History and Physical, chart, labs and discussed the procedure including the risks, benefits and alternatives for the proposed anesthesia with the patient or authorized representative who has indicated his/her understanding and acceptance.     Dental  advisory given and Interpreter used for interveiw  Plan Discussed with: CRNA  Anesthesia Plan Comments:        Anesthesia Quick Evaluation

## 2020-05-05 NOTE — Transfer of Care (Signed)
Immediate Anesthesia Transfer of Care Note  Patient: Jennifer [name]  Procedure(s) Performed: OPEN REDUCTION INTERNAL FIXATION (ORIF) ANKLE FRACTURE (Right Ankle)  Patient Location: PACU  Anesthesia Type:General and Regional  Level of Consciousness: drowsy and responds to stimulation  Airway & Oxygen Therapy: Patient Spontanous Breathing and Patient connected to face mask oxygen  Post-op Assessment: Report given to RN, Post -op Vital signs reviewed and stable and Patient moving all extremities  Post vital signs: Reviewed and stable  Last Vitals:  Vitals Value Taken Time  BP 141/69 05/05/20 1113  Temp 36.6 C 05/05/20 1113  Pulse 88 05/05/20 1116  Resp 13 05/05/20 1116  SpO2 100 % 05/05/20 1116  Vitals shown include unvalidated device data.  Last Pain:  Vitals:   05/05/20 0808  PainSc: 5       Patients Stated Pain Goal: 3 (59/74/16 3845)  Complications: No complications documented.

## 2020-05-05 NOTE — Progress Notes (Signed)
Orthopedic Tech Progress Note Patient Details:  Jennifer [name] [birth year] [id number]  Ortho Devices Type of Ortho Device: CAM walker Ortho Device/Splint Location: Patient Right Ortho Device/Splint Interventions: Ordered,Application,Adjustment   Post Interventions Patient Tolerated: Well Instructions Provided: Adjustment of device,Care of device   Rosana Hoes 05/05/2020, 12:59 PM

## 2020-05-05 NOTE — Anesthesia Procedure Notes (Addendum)
Anesthesia Regional Block: Adductor canal block   Pre-Anesthetic Checklist: ,, timeout performed, Correct Patient, Correct Site, Correct Laterality, Correct Procedure, Correct Position, site marked, Risks and benefits discussed,  Surgical consent,  Pre-op evaluation,  At surgeon's request and post-op pain management  Laterality: Right  Prep: Maximum Sterile Barrier Precautions used, chloraprep       Needles:  Injection technique: Single-shot  Needle Type: Echogenic Stimulator Needle     Needle Length: 9cm  Needle Gauge: 22     Additional Needles:   Procedures:,,,, ultrasound used (permanent image in chart),,,,  Narrative:  Start time: 05/05/2020 8:55 AM End time: 05/05/2020 9:00 AM Injection made incrementally with aspirations every 5 mL.  Performed by: Personally  Anesthesiologist: Pervis Hocking, DO  Additional Notes: Monitors applied. No increased pain on injection. No increased resistance to injection. Injection made in 5cc increments. Good needle visualization. Patient tolerated procedure well.

## 2020-05-05 NOTE — Anesthesia Postprocedure Evaluation (Signed)
Anesthesia Post Note  Patient: Jennifer [name]  Procedure(s) Performed: OPEN REDUCTION INTERNAL FIXATION (ORIF) ANKLE FRACTURE (Right Ankle)     Patient location during evaluation: PACU Anesthesia Type: Regional and MAC Level of consciousness: awake and alert Pain management: pain level controlled Vital Signs Assessment: post-procedure vital signs reviewed and stable Respiratory status: spontaneous breathing, nonlabored ventilation and respiratory function stable Cardiovascular status: blood pressure returned to baseline and stable Postop Assessment: no apparent nausea or vomiting Anesthetic complications: no   No complications documented.  Last Vitals:  Vitals:   05/05/20 1200 05/05/20 1215  BP: (!) 151/81 (!) 120/99  Pulse: 87 88  Resp: 16 (!) 25  Temp:  36.7 C  SpO2: 100% 100%    Last Pain:  Vitals:   05/05/20 1215  PainSc: [place]

## 2020-05-05 NOTE — H&P (Signed)
Orthopaedic Trauma Service (OTS) H&P  [name] MRN: [id number] DOB/AGE: [birth year] 50 y.o.  Reason for Surgery: Right trimalleolar ankle fracture  HPI: Jennifer [name] is an 50 y.o. female with past medical history significant for A. fib, hypertension, diabetes presenting for surgery on right ankle.  Patient sustained a fall while in the kitchen on 04/28/2020.  Had immediate pain in the right ankle and was unable to bear weight.  Was evaluated with care emergency department and found to have a right trimalleolar ankle fracture.  Orthopedics was consulted for evaluation management who requested patient be placed in a short leg splint and follow-up as an outpatient to discuss management options.  Patient has been nonweightbearing on right lower extremity since time of injury.  Pain has been fairly well controlled.  She denies any numbness or tingling to her extremity.  Denies any other injury from her fall.  Denies any previous injury or surgery to the right lower extremity.  Patient on Eliquis for her A. fib.  On insulin and metformin for her diabetes.  She is unsure of her last hemoglobin A1c but does check her blood sugar 3 times a day.  Ambulates at baseline without any assistive device.  Past Medical History:  Diagnosis Date  . Anemia, iron deficiency   . Atrial fibrillation ([place])   . Blindness of left eye   . Decreased visual acuity    Left eye  . Depression   . 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 Hair loss   . History of fracture of clavicle 05/18/2015  . Hyperlipidemia   . Hypertension   . Iron deficiency anemia 05/13/2013  . Pain, dental 08/19/2018   Tooth pain/facial swelling: has poor dentition at baseline, history of dental abscess.  She has not seen a dentist in about one year.  Dentist is on [address].  No fevers chills or  systemic symptoms.  Diabetes has been poorly controlled for some time.  She said it has improved recently averaging around 140.  Called the dentist said they would not see patients until May 14th but the urgency o  . Pap smear abnormality of cervix with LGSIL   . Routine/ritual circumcision   . Type II diabetes mellitus ([name])   . Uveitis     Past Surgical History:  Procedure Laterality Date  . CESAREAN SECTION     x 1  . EYE SURGERY Bilateral    Lasik  . MULTIPLE TOOTH EXTRACTIONS     bottom denture    Family History  Problem Relation Age of Onset  . Diabetes Father   . Hypertension Other     Social History:  reports that she has never smoked. She has never used smokeless tobacco. She reports that she does not drink alcohol and does not use drugs.  Allergies: No Known Allergies  Medications:  I have reviewed the patient's current medications. Prior to Admission:  No medications prior to admission.    ROS: Constitutional: No fever or chills Vision: No changes in vision ENT: No difficulty swallowing CV: No chest pain Pulm: No SOB or wheezing GI: No nausea or vomiting GU: No urgency or inability to hold urine Skin: No poor wound healing Neurologic: No numbness or tingling Psychiatric: No depression or anxiety Heme: No bruising Allergic: No reaction to medications or food   Exam: Height 5\' 6"  (1.676 m),  weight 99.3 kg, last menstrual period 08/21/2016. General: No acute distress Orientation: Alert and oriented x3 Mood and Affect: Mood and affect appropriate.  Pleasant and cooperative Gait: Not assessed due to known fracture Coordination and balance: Within normal limits  Right lower extremity: Short leg splint in place.  Splint not fully removed but skin evaluated, swelling is appropriate.  No open wounds or skin lesions appreciated.  Able to wiggle toes small amount.  Endorses sensation to light touch of toes and foot.  Foot warm and well-perfused.  Left lower  extremity: Skin without lesions. No tenderness to palpation. Full painless ROM, full strength in each muscle group without evidence of instability.  Motor and sensory function intact.  Neurovascularly intact   Medical Decision Making: Data: Imaging: X-ray and CT scan of right ankle shows trimalleolar ankle fracture  Labs:  Results for orders placed or performed during the hospital encounter of 05/04/20 (from the past 168 hour(s))  SARS CORONAVIRUS 2 (TAT 6-24 HRS) Nasopharyngeal Nasopharyngeal Swab   Collection Time: 05/04/20  1:34 PM   Specimen: Nasopharyngeal Swab  Result Value Ref Range   SARS Coronavirus 2 NEGATIVE NEGATIVE  Results for orders placed or performed during the hospital encounter of 04/28/20 (from the past 168 hour(s))  Ethanol   Collection Time: 04/28/20 11:24 PM  Result Value Ref Range   Alcohol, Ethyl (B) <10 <10 mg/dL  Comprehensive metabolic panel   Collection Time: 04/28/20 11:25 PM  Result Value Ref Range   Sodium 138 135 - 145 mmol/L   Potassium 3.9 3.5 - 5.1 mmol/L   Chloride 104 98 - 111 mmol/L   CO2 24 22 - 32 mmol/L   Glucose, Bld 279 (H) 70 - 99 mg/dL   BUN 19 6 - 20 mg/dL   Creatinine, Ser 1.13 (H) 0.44 - 1.00 mg/dL   Calcium 9.1 8.9 - 10.3 mg/dL   Total Protein 7.3 6.5 - 8.1 g/dL   Albumin 2.9 (L) 3.5 - 5.0 g/dL   AST 15 15 - 41 U/L   ALT 14 0 - 44 U/L   Alkaline Phosphatase 143 (H) 38 - 126 U/L   Total Bilirubin 0.6 0.3 - 1.2 mg/dL   GFR, Estimated 60 (L) >60 mL/min   Anion gap 10 5 - 15  CBC   Collection Time: 04/28/20 11:25 PM  Result Value Ref Range   WBC 6.1 4.0 - 10.5 K/uL   RBC 3.78 (L) 3.87 - 5.11 MIL/uL   Hemoglobin 9.6 (L) 12.0 - 15.0 g/dL   HCT 33.1 (L) 36.0 - 46.0 %   MCV 87.6 80.0 - 100.0 fL   MCH 25.4 (L) 26.0 - 34.0 pg   MCHC 29.0 (L) 30.0 - 36.0 g/dL   RDW 13.8 11.5 - 15.5 %   Platelets 197 150 - 400 K/uL   nRBC 0.0 0.0 - 0.2 %  Protime-INR   Collection Time: 04/28/20 11:25 PM  Result Value Ref Range   Prothrombin  Time 13.2 11.4 - 15.2 seconds   INR 1.0 0.8 - 1.2  I-Stat Chem 8, ED   Collection Time: 04/28/20 11:59 PM  Result Value Ref Range   Sodium 141 135 - 145 mmol/L   Potassium 3.9 3.5 - 5.1 mmol/L   Chloride 103 98 - 111 mmol/L   BUN 21 (H) 6 - 20 mg/dL   Creatinine, Ser 1.10 (H) 0.44 - 1.00 mg/dL   Glucose, Bld 280 (H) 70 - 99 mg/dL   Calcium, Ion 1.21 1.15 - 1.40 mmol/L  TCO2 27 22 - 32 mmol/L   Hemoglobin 10.5 (L) 12.0 - 15.0 g/dL   HCT 31.0 (L) 36.0 - 46.0 %    Assessment/Plan: 50 year old female status post fall, resulting in right trimalleolar ankle fracture.  Patient with significant injury to right lower extremity that will require surgical fixation.  Risk and benefits of procedure discussed the patient and her son.  Risks discussed included bleeding, infection, malunion, nonunion, damage to surrounding nerves and blood vessels, pain, hardware prominence or irritation, hardware failure, stiffness, post-traumatic arthritis, DVT/PE, and anesthesia complications.  Patient states understanding of risks. All questions answered to the patient's satisfaction and she agrees to proceed with surgery.  We will plan to discharge patient from PACU postoperatively and she will follow-up in OTS clinic in 2 weeks for repeat imaging and splint/suture removal.  Dreamer Carillo A. Carmie Kanner [facility] [phone] (office) [email or web address]

## 2020-05-05 NOTE — Op Note (Addendum)
Orthopaedic Surgery Operative Note (CSN: [id number] ) Date of Surgery: 05/05/2020  Admit Date: 05/05/2020   Diagnoses: Pre-Op Diagnoses: Right trimalleolar ankle fracture   Post-Op Diagnosis: Same  Procedures: 1. CPT 38333-OVAN reduction internal fixation of right trimalleolar ankle fracture 2. CPT 27829-Open reduction of right syndesmosis right ankle  Surgeons : Primary: Arda Keadle, Thomasene Lot, MD  Assistant: Patrecia Pace, PA-C  Location: OR 3   Anesthesia:General with regional  Antibiotics: Ancef 2g preop with 1 gm vancomycin powder   Tourniquet time:None used  Estimated Blood VBTY:60 mL  Complications:None  Specimens:None   Implants: Implant Name Type Inv. Item Serial No. Manufacturer Lot No. LRB No. Used Action  SCREW CORT ST EVOS 3.5X55 - [id number] Screw SCREW CORT ST EVOS 3.5X55  [employer]  Right 2 Implanted  PLATE FIB EVOS 41S2.3/9.5 5H - [id number] Plate PLATE FIB EVOS 68S1.6/8.3 5H  [employer]  Right 1 Implanted  SCREW CORT EVOS ST 3.5X12 - [id number] Screw SCREW CORT EVOS ST 3.5X12  [employer]  Right 1 Implanted  SCREW CORT 2.7X15 T8 ST EVOS - [id number] Screw SCREW CORT 2.7X15 T8 ST EVOS  [employer]  Right 1 Implanted  SCREW CORT 2.7X14 T8 EVOS - [id number] Screw SCREW CORT 2.7X14 T8 EVOS  [employer]  Right 2 Implanted  SCREW CORT 2.7X17 T8 ST EVOS - [id number] Screw SCREW CORT 2.7X17 T8 ST EVOS  [employer]  Right 1 Implanted  KIT INVISIKNOT ANKLE FRACTURE - [id number] Screw KIT INVISIKNOT ANKLE FRACTURE  [employer]  Right 1 Implanted  SCREW CORT 3.5X11 ST EVOS - [id number] Screw SCREW CORT 3.5X11 ST EVOS  [employer]  Right 2 Implanted     Indications for Surgery: 50 year old female who sustained a right trimalleolar ankle fracture.  Due to the unstable nature of her injury I recommended proceeding to the operating room for open  reduction internal fixation.  Risks and benefits were discussed with the patient.  Risks included but not limited to bleeding, infection, malunion, nonunion, hardware failure, hardware irritation, nerve and blood vessel injury, DVT, pressure back arthritis, ankle stiffness, even the possibility of anesthetic complications.  The patient agreed to proceed with surgery and consent was obtained.  Operative Findings: 1.  Open reduction internal fixation right trimalleolar ankle fracture using [employer] EVOS 3.5/2.44m distal fibular locking plate with independent 3.5 mm medial malleolus screws 2.  Medial clear space widening with external rotation stress view treated with [facility] Invisiknot suture anchor  Procedure: The patient was identified in the preoperative holding area. Consent was confirmed with the patient and their family and all questions were answered. The operative extremity was marked after confirmation with the patient. she was then brought back to the operating room by our anesthesia colleagues.  She was placed under general anesthetic and carefully transferred over to a radiolucent flat top table.  A bump was placed under her operative hip.  The right lower extremity was then prepped and draped in usual sterile fashion.  A timeout was performed to verify the patient, the procedure, and the extremity.  Preoperative antibiotics were dosed.  Fluoroscopic imaging was obtained to show the unstable nature of her injury.  A direct lateral approach to the distal fibula was carried down to skin and subcutaneous tissue.  The fracture site was exposed using the 15 blade and the hematoma was cleaned out.  Traction was applied to the distal  fibula and reduction was performed and held provisionally with reduction tenaculums.  I confirmed anatomic reduction with fluoroscopy and I was able to visualize the cortical reads.  I then made a medial incision and carried it down through skin and subcutaneous  tissue.  I carefully dissected out the neurovascular bundle on the medial side.  I then exposed the fracture site and then proceeded to reduce the medial malleolus anatomically with a reduction tenaculum.  I confirmed reduction with fluoroscopy.  I then drilled and placed 3.5 mm nonlocking screws through the medial malleolus to hold the reduction.  I drilled these and placed bicortical screws.  A total of 2 screws were placed medially.  I then returned to the lateral side and used a [place] 3.5/2.41m distal fibula locking plate and placed this through the clamps that were already in place.  I then placed a nonlocking screw into the proximal shaft segment and a nonlocking screw in the distal shaft segment.  I then proceeded to place locking screws into the distal fibula.  I then placed 2 more nonlocking screws into the proximal shaft.  I removed my tenaculums and obtained a fluoroscopic imaging.  And external rotation stress view was performed.  It was relatively stable however there was just a slight amount of questionable medial clear space widening.  As a result of this I felt that syndesmotic fixation was appropriate as she does have a history of diabetes.  Using the Invisiknot drill I drilled through the fibula and tibia out through the medial cortex.  I then brought through the medial cortex the suture button.  I then had the ankle and full dorsiflexion and then proceeded to tighten the suture fixation device.  I then tied it over and cut the knot.  A repeat external rotation stress view showed no instability.  Final fluoroscopic imaging was obtained.  The incisions were copiously irrigated.  A gram of vancomycin powder was placed into the incisions.  Layered closure of 2-0 Vicryl and 3-0 nylon was placed.  Sterile dressings were placed and a walking boot was applied.  The patient was then awoken from anesthesia and taken to the PACU in stable condition.  Post Op Plan/Instructions: Patient  will be nonweightbearing to the right lower extremity.  She will be discharged home from the PACU.  She will be placed on her home Eliquis for DVT prophylaxis.  We will see her back in 2 weeks for x-rays and suture removal.  I was present and performed the entire surgery.  SPatrecia Pace PA-C did assist me throughout the case. An assistant was necessary given the difficulty in approach, maintenance of reduction and ability to instrument the fracture.   KKatha Hamming MD [facility]

## 2020-05-05 NOTE — Discharge Instructions (Addendum)
Orthopaedic Trauma Service Discharge Instructions   General Discharge Instructions  WEIGHT BEARING STATUS: Non-weightbearing right lower extremity  RANGE OF MOTION/ACTIVITY: Ok for gentle knee and ankle motion as tolerated  Wound Care: You may remove surgical dressing on post-op day #2 (Sunday 05/07/20). Incisions can be left open to air if there is no drainage. If incision continues to have drainage, follow wound care instructions below. Okay to shower if no drainage from incisions.  DVT/PE prophylaxis: Take Aspirin 81 mg on post op day 31 (Saturday 05/06/20). Restart you home dose Eliquis on post op day #2 (Sunday 05/07/20)  Diet: as you were eating previously.  Can use over the counter stool softeners and bowel preparations, such as Miralax, to help with bowel movements.  Narcotics can be constipating.  Be sure to drink plenty of fluids  PAIN MEDICATION USE AND EXPECTATIONS  You have likely been given narcotic medications to help control your pain.  After a traumatic event that results in an fracture (broken bone) with or without surgery, it is ok to use narcotic pain medications to help control one's pain.  We understand that everyone responds to pain differently and each individual patient will be evaluated on a regular basis for the continued need for narcotic medications. Ideally, narcotic medication use should last no more than 6-8 weeks (coinciding with fracture healing).   As a patient it is your responsibility as well to monitor narcotic medication use and report the amount and frequency you use these medications when you come to your office visit.   We would also advise that if you are using narcotic medications, you should take a dose prior to therapy to maximize you participation.  IF YOU ARE ON NARCOTIC MEDICATIONS IT IS NOT PERMISSIBLE TO OPERATE A MOTOR VEHICLE (MOTORCYCLE/CAR/TRUCK/MOPED) OR HEAVY MACHINERY DO NOT MIX NARCOTICS WITH OTHER CNS (CENTRAL NERVOUS SYSTEM)  DEPRESSANTS SUCH AS ALCOHOL   STOP SMOKING OR USING NICOTINE PRODUCTS!!!!  As discussed nicotine severely impairs your body's ability to heal surgical and traumatic wounds but also impairs bone healing.  Wounds and bone heal by forming microscopic blood vessels (angiogenesis) and nicotine is a vasoconstrictor (essentially, shrinks blood vessels).  Therefore, if vasoconstriction occurs to these microscopic blood vessels they essentially disappear and are unable to deliver necessary nutrients to the healing tissue.  This is one modifiable factor that you can do to dramatically increase your chances of healing your injury.    (This means no smoking, no nicotine gum, patches, etc)  DO NOT USE NONSTEROIDAL ANTI-INFLAMMATORY DRUGS (NSAID'S)  Using products such as Advil (ibuprofen), Aleve (naproxen), Motrin (ibuprofen) for additional pain control during fracture healing can delay and/or prevent the healing response.  If you would like to take over the counter (OTC) medication, Tylenol (acetaminophen) is ok.  However, some narcotic medications that are given for pain control contain acetaminophen as well. Therefore, you should not exceed more than 4000 mg of tylenol in a day if you do not have liver disease.  Also note that there are may OTC medicines, such as cold medicines and allergy medicines that my contain tylenol as well.  If you have any questions about medications and/or interactions please ask your doctor/PA or your pharmacist.      ICE AND ELEVATE INJURED/OPERATIVE EXTREMITY  Using ice and elevating the injured extremity above your heart can help with swelling and pain control.  Icing in a pulsatile fashion, such as 20 minutes on and 20 minutes off, can be followed.  Do not place ice directly on skin. Make sure there is a barrier between to skin and the ice pack.    Using frozen items such as frozen peas works well as the conform nicely to the are that needs to be iced.  USE AN ACE WRAP OR TED  HOSE FOR SWELLING CONTROL  In addition to icing and elevation, Ace wraps or TED hose are used to help limit and resolve swelling.  It is recommended to use Ace wraps or TED hose until you are informed to stop.    When using Ace Wraps start the wrapping distally (farthest away from the body) and wrap proximally (closer to the body)   Example: If you had surgery on your leg or thing and you do not have a splint on, start the ace wrap at the toes and work your way up to the thigh        If you had surgery on your upper extremity and do not have a splint on, start the ace wrap at your fingers and work your way up to the upper arm  IF YOU ARE IN A CAM BOOT (BLACK BOOT)  You may remove boot periodically. Perform daily dressing changes as noted below.  Wash the liner of the boot regularly and wear a sock when wearing the boot. It is recommended that you sleep in the boot until told otherwise   CALL THE OFFICE WITH ANY QUESTIONS OR CONCERNS: [phone]   VISIT OUR WEBSITE FOR ADDITIONAL INFORMATION: [email or web address]    Discharge Wound Care Instructions  Do NOT apply any ointments, solutions or lotions to pin sites or surgical wounds.  These prevent needed drainage and even though solutions like hydrogen peroxide kill bacteria, they also damage cells lining the pin sites that help fight infection.  Applying lotions or ointments can keep the wounds moist and can cause them to breakdown and open up as well. This can increase the risk for infection. When in doubt call the office.  Surgical incisions should be dressed daily.  If any drainage is noted, use one layer of adaptic, then gauze, Kerlix, and an ace wrap.  Once the incision is completely dry and without drainage, it may be left open to air out.  Showering may begin 36-48 hours later.  Cleaning gently with soap and water.  Traumatic wounds should be dressed daily as well.    One layer of adaptic, gauze, Kerlix, then ace wrap.  The adaptic can  be discontinued once the draining has ceased    If you have a wet to dry dressing: wet the gauze with saline the squeeze as much saline out so the gauze is moist (not soaking wet), place moistened gauze over wound, then place a dry gauze over the moist one, followed by Kerlix wrap, then ace wrap.

## 2020-05-05 NOTE — Interval H&P Note (Signed)
History and Physical Interval Note:  05/05/2020 9:04 AM  Jennifer [name]  has presented today for surgery, with the diagnosis of Right ankle fracture.  The various methods of treatment have been discussed with the patient and family. After consideration of risks, benefits and other options for treatment, the patient has consented to  Procedure(s): OPEN REDUCTION INTERNAL FIXATION (ORIF) ANKLE FRACTURE (Right) as a surgical intervention.  The patient's history has been reviewed, patient examined, no change in status, stable for surgery.  I have reviewed the patient's chart and labs.  Questions were answered to the patient's satisfaction.     Lennette Bihari P Sharah Finnell

## 2020-05-07 ENCOUNTER — Encounter (HOSPITAL_COMMUNITY): Payer: Self-pay | Admitting: Student

## 2020-05-12 ENCOUNTER — Encounter: Payer: Medicaid Other | Admitting: Student

## 2020-05-12 ENCOUNTER — Other Ambulatory Visit: Payer: Self-pay

## 2020-05-12 ENCOUNTER — Emergency Department (HOSPITAL_COMMUNITY)
Admission: EM | Admit: 2020-05-12 | Discharge: 2020-05-12 | Disposition: A | Discharge status: Home / Self Care | Payer: Medicaid Other | Attending: Emergency Medicine | Admitting: Emergency Medicine

## 2020-05-12 ENCOUNTER — Emergency Department (HOSPITAL_COMMUNITY): Payer: Medicaid Other

## 2020-05-12 DIAGNOSIS — R112 Nausea with vomiting, unspecified: Secondary | ICD-10-CM

## 2020-05-12 DIAGNOSIS — Z7901 Long term (current) use of anticoagulants: Secondary | ICD-10-CM | POA: Insufficient documentation

## 2020-05-12 DIAGNOSIS — B961 Klebsiella pneumoniae [K. pneumoniae] as the cause of diseases classified elsewhere: Secondary | ICD-10-CM | POA: Diagnosis not present

## 2020-05-12 DIAGNOSIS — R739 Hyperglycemia, unspecified: Secondary | ICD-10-CM

## 2020-05-12 DIAGNOSIS — Z794 Long term (current) use of insulin: Secondary | ICD-10-CM | POA: Insufficient documentation

## 2020-05-12 DIAGNOSIS — N390 Urinary tract infection, site not specified: Secondary | ICD-10-CM

## 2020-05-12 DIAGNOSIS — I509 Heart failure, unspecified: Secondary | ICD-10-CM | POA: Diagnosis not present

## 2020-05-12 DIAGNOSIS — R109 Unspecified abdominal pain: Secondary | ICD-10-CM | POA: Diagnosis present

## 2020-05-12 DIAGNOSIS — I110 Hypertensive heart disease with heart failure: Secondary | ICD-10-CM | POA: Diagnosis not present

## 2020-05-12 DIAGNOSIS — E1165 Type 2 diabetes mellitus with hyperglycemia: Secondary | ICD-10-CM | POA: Insufficient documentation

## 2020-05-12 DIAGNOSIS — Z79899 Other long term (current) drug therapy: Secondary | ICD-10-CM | POA: Diagnosis not present

## 2020-05-12 DIAGNOSIS — K5903 Drug induced constipation: Secondary | ICD-10-CM | POA: Insufficient documentation

## 2020-05-12 DIAGNOSIS — Z7984 Long term (current) use of oral hypoglycemic drugs: Secondary | ICD-10-CM | POA: Insufficient documentation

## 2020-05-12 LAB — URINALYSIS, ROUTINE W REFLEX MICROSCOPIC
Bilirubin Urine: NEGATIVE
Glucose, UA: >=500 mg/dL — AB
Ketones, ur: 20 mg/dL — AB
Nitrite: NEGATIVE
Protein, ur: >=300 mg/dL — AB
Specific Gravity, Urine: 1.015 (ref 1.005–1.030)
WBC, UA: >50 WBC/hpf — ABNORMAL HIGH (ref 0–5)
pH: 5 (ref 5.0–8.0)

## 2020-05-12 LAB — CBG MONITORING, ED: Glucose-Capillary: 342 mg/dL — ABNORMAL HIGH (ref 70–99)

## 2020-05-12 LAB — COMPREHENSIVE METABOLIC PANEL
ALT: 9 U/L (ref 0–44)
AST: 10 U/L — ABNORMAL LOW (ref 15–41)
Albumin: 2.9 g/dL — ABNORMAL LOW (ref 3.5–5.0)
Alkaline Phosphatase: 119 U/L (ref 38–126)
Anion gap: 14 (ref 5–15)
BUN: 18 mg/dL (ref 6–20)
CO2: 24 mmol/L (ref 22–32)
Calcium: 9.1 mg/dL (ref 8.9–10.3)
Chloride: 95 mmol/L — ABNORMAL LOW (ref 98–111)
Creatinine, Ser: 1.22 mg/dL — ABNORMAL HIGH (ref 0.44–1.00)
GFR, Estimated: 54 mL/min — ABNORMAL LOW (ref >60)
Glucose, Bld: 369 mg/dL — ABNORMAL HIGH (ref 70–99)
Potassium: 4.2 mmol/L (ref 3.5–5.1)
Sodium: 133 mmol/L — ABNORMAL LOW (ref 135–145)
Total Bilirubin: 0.9 mg/dL (ref 0.3–1.2)
Total Protein: 7.9 g/dL (ref 6.5–8.1)

## 2020-05-12 LAB — CBC WITH DIFFERENTIAL/PLATELET
Abs Immature Granulocytes: 0.05 10*3/uL (ref 0.00–0.07)
Basophils Absolute: 0 10*3/uL (ref 0.0–0.1)
Basophils Relative: 0 %
Eosinophils Absolute: 0 10*3/uL (ref 0.0–0.5)
Eosinophils Relative: 0 %
HCT: 31.8 % — ABNORMAL LOW (ref 36.0–46.0)
Hemoglobin: 10 g/dL — ABNORMAL LOW (ref 12.0–15.0)
Immature Granulocytes: 1 %
Lymphocytes Relative: 10 %
Lymphs Abs: 1.1 10*3/uL (ref 0.7–4.0)
MCH: 26 pg (ref 26.0–34.0)
MCHC: 31.4 g/dL (ref 30.0–36.0)
MCV: 82.6 fL (ref 80.0–100.0)
Monocytes Absolute: 0.5 10*3/uL (ref 0.1–1.0)
Monocytes Relative: 5 %
Neutro Abs: 9 10*3/uL — ABNORMAL HIGH (ref 1.7–7.7)
Neutrophils Relative %: 84 %
Platelets: 274 10*3/uL (ref 150–400)
RBC: 3.85 MIL/uL — ABNORMAL LOW (ref 3.87–5.11)
RDW: 13.8 % (ref 11.5–15.5)
WBC: 10.7 10*3/uL — ABNORMAL HIGH (ref 4.0–10.5)
nRBC: 0 % (ref 0.0–0.2)

## 2020-05-12 LAB — I-STAT VENOUS BLOOD GAS, ED
Acid-Base Excess: 3 mmol/L — ABNORMAL HIGH (ref 0.0–2.0)
Bicarbonate: 26.6 mmol/L (ref 20.0–28.0)
Calcium, Ion: 1.09 mmol/L — ABNORMAL LOW (ref 1.15–1.40)
HCT: 33 % — ABNORMAL LOW (ref 36.0–46.0)
Hemoglobin: 11.2 g/dL — ABNORMAL LOW (ref 12.0–15.0)
O2 Saturation: 86 %
Potassium: 4.2 mmol/L (ref 3.5–5.1)
Sodium: 134 mmol/L — ABNORMAL LOW (ref 135–145)
TCO2: 28 mmol/L (ref 22–32)
pCO2, Ven: 37.7 mmHg — ABNORMAL LOW (ref 44.0–60.0)
pH, Ven: 7.457 — ABNORMAL HIGH (ref 7.250–7.430)
pO2, Ven: 48 mmHg — ABNORMAL HIGH (ref 32.0–45.0)

## 2020-05-12 LAB — LIPASE, BLOOD: Lipase: 15 U/L (ref 11–51)

## 2020-05-12 MED ORDER — POLYETHYLENE GLYCOL 3350 17 G PO PACK: 17.0000 g | PACK | Freq: Every day | ORAL | 0 refills | Status: DC

## 2020-05-12 MED ORDER — ONDANSETRON HCL 4 MG/2ML IJ SOLN
4.0000 mg | Freq: Once | INTRAMUSCULAR | Status: AC
  Administered 2020-05-12: 4 mg via INTRAVENOUS
  Filled 2020-05-12: qty 2

## 2020-05-12 MED ORDER — SODIUM CHLORIDE 0.9 % IV BOLUS
1000.0000 mL | Freq: Once | INTRAVENOUS | Status: AC
  Administered 2020-05-12: 1000 mL via INTRAVENOUS

## 2020-05-12 MED ORDER — ACETAMINOPHEN 325 MG PO TABS
650.0000 mg | ORAL_TABLET | Freq: Once | ORAL | Status: AC
  Administered 2020-05-12: 650 mg via ORAL
  Filled 2020-05-12: qty 2

## 2020-05-12 MED ORDER — SODIUM CHLORIDE 0.9 % IV SOLN
1.0000 g | Freq: Once | INTRAVENOUS | Status: AC
  Administered 2020-05-12: 1 g via INTRAVENOUS
  Filled 2020-05-12: qty 10

## 2020-05-12 MED ORDER — CEPHALEXIN 500 MG PO CAPS: 500.0000 mg | ORAL_CAPSULE | Freq: Three times a day (TID) | ORAL | 0 refills | Status: DC

## 2020-05-12 MED ORDER — ONDANSETRON 4 MG PO TBDP: 4.0000 mg | ORAL_TABLET | Freq: Three times a day (TID) | ORAL | 0 refills | Status: DC | PRN

## 2020-05-12 NOTE — ED Triage Notes (Signed)
Pt arrives via [facility] stretcher c/o constipation x4 days with N/V. Pt is diabetic and EMS reports CBG 426. Pt recently had R foot fx and is prescribed oxycodone post-op.   EMS last VS - 142/80, HR 88, 99% on RA

## 2020-05-12 NOTE — ED Provider Notes (Signed)
Care assumed at shift change from abdomen PA-C.  Patient presenting for constipation in the setting of postop opioid use.  She had KUB today and is negative. Plan to follow U/A for resolution of recent pyelonephritis last month.  She is not having urinary symptoms though was having some nausea and vomiting today and had similar nausea and vomiting with last diagnosis of UTI.  Plan to follow UA, treat any signs of infection with Keflex, discharge with miralax and zofran.  Physical Exam  BP (!) 184/71   Pulse 96   Temp 99.5 F (37.5 C) (Oral)   Resp (!) 34   Ht 5\' 6"  (1.676 m)   Wt 98.9 kg   LMP 08/21/2016 (Exact Date)   SpO2 96%   BMI 35.19 kg/m   Physical Exam Vitals and nursing note reviewed.  Constitutional:      General: She is not in acute distress.    Appearance: She is well-developed and well-nourished.  HENT:     Head: Normocephalic and atraumatic.  Eyes:     Conjunctiva/sclera: Conjunctivae normal.  Cardiovascular:     Rate and Rhythm: Normal rate.  Pulmonary:     Effort: Pulmonary effort is normal.  Abdominal:     Palpations: Abdomen is soft.  Skin:    General: Skin is warm.  Neurological:     Mental Status: She is alert.  Psychiatric:        Mood and Affect: Mood and affect normal.        Behavior: Behavior normal.     ED Course/Procedures   Clinical Course as of 05/12/20 1519  Fri May 12, 2020  1147 Glucose-Capillary(!): 342 [CA]  1403 WBC(!): 10.7 [CA]    Clinical Course User Index [CA] Suzy Bouchard, PA-C    Procedures Results for orders placed or performed during the hospital encounter of 05/12/20  Urine culture   Specimen: Urine, Clean Catch  Result Value Ref Range   Specimen Description URINE, CLEAN CATCH    Special Requests NONE    Culture (A)     >=100,000 COLONIES/mL KLEBSIELLA PNEUMONIAE SUSCEPTIBILITIES TO FOLLOW Performed at [facility], [address] [place], [place] [address]    Report Status PENDING   CBC with  Differential  Result Value Ref Range   WBC 10.7 (H) 4.0 - 10.5 K/uL   RBC 3.85 (L) 3.87 - 5.11 MIL/uL   Hemoglobin 10.0 (L) 12.0 - 15.0 g/dL   HCT 31.8 (L) 36.0 - 46.0 %   MCV 82.6 80.0 - 100.0 fL   MCH 26.0 26.0 - 34.0 pg   MCHC 31.4 30.0 - 36.0 g/dL   RDW 13.8 11.5 - 15.5 %   Platelets 274 150 - 400 K/uL   nRBC 0.0 0.0 - 0.2 %   Neutrophils Relative % 84 %   Neutro Abs 9.0 (H) 1.7 - 7.7 K/uL   Lymphocytes Relative 10 %   Lymphs Abs 1.1 0.7 - 4.0 K/uL   Monocytes Relative 5 %   Monocytes Absolute 0.5 0.1 - 1.0 K/uL   Eosinophils Relative 0 %   Eosinophils Absolute 0.0 0.0 - 0.5 K/uL   Basophils Relative 0 %   Basophils Absolute 0.0 0.0 - 0.1 K/uL   Immature Granulocytes 1 %   Abs Immature Granulocytes 0.05 0.00 - 0.07 K/uL  Comprehensive metabolic panel  Result Value Ref Range   Sodium 133 (L) 135 - 145 mmol/L   Potassium 4.2 3.5 - 5.1 mmol/L   Chloride 95 (L)  98 - 111 mmol/L   CO2 24 22 - 32 mmol/L   Glucose, Bld 369 (H) 70 - 99 mg/dL   BUN 18 6 - 20 mg/dL   Creatinine, Ser 1.22 (H) 0.44 - 1.00 mg/dL   Calcium 9.1 8.9 - 10.3 mg/dL   Total Protein 7.9 6.5 - 8.1 g/dL   Albumin 2.9 (L) 3.5 - 5.0 g/dL   AST 10 (L) 15 - 41 U/L   ALT 9 0 - 44 U/L   Alkaline Phosphatase 119 38 - 126 U/L   Total Bilirubin 0.9 0.3 - 1.2 mg/dL   GFR, Estimated 54 (L) >60 mL/min   Anion gap 14 5 - 15  Lipase, blood  Result Value Ref Range   Lipase 15 11 - 51 U/L  Urinalysis, Routine w reflex microscopic Urine, Clean Catch  Result Value Ref Range   Color, Urine YELLOW YELLOW   APPearance CLOUDY (A) CLEAR   Specific Gravity, Urine 1.015 1.005 - 1.030   pH 5.0 5.0 - 8.0   Glucose, UA >=500 (A) NEGATIVE mg/dL   Hgb urine dipstick MODERATE (A) NEGATIVE   Bilirubin Urine NEGATIVE NEGATIVE   Ketones, ur 20 (A) NEGATIVE mg/dL   Protein, ur >=300 (A) NEGATIVE mg/dL   Nitrite NEGATIVE NEGATIVE   Leukocytes,Ua MODERATE (A) NEGATIVE   RBC / HPF 6-10 0 - 5 RBC/hpf   WBC, UA >50 (H) 0 - 5 WBC/hpf    Bacteria, UA MANY (A) NONE SEEN   Squamous Epithelial / LPF 0-5 0 - 5   WBC Clumps PRESENT    Hyaline Casts, UA PRESENT   POC CBG, ED  Result Value Ref Range   Glucose-Capillary 342 (H) 70 - 99 mg/dL   Comment 1 Notify RN    Comment 2 Document in Chart   I-Stat venous blood gas, [facility] ED)  Result Value Ref Range   pH, Ven 7.457 (H) 7.250 - 7.430   pCO2, Ven 37.7 (L) 44.0 - 60.0 mmHg   pO2, Ven 48.0 (H) 32.0 - 45.0 mmHg   Bicarbonate 26.6 20.0 - 28.0 mmol/L   TCO2 28 22 - 32 mmol/L   O2 Saturation 86.0 %   Acid-Base Excess 3.0 (H) 0.0 - 2.0 mmol/L   Sodium 134 (L) 135 - 145 mmol/L   Potassium 4.2 3.5 - 5.1 mmol/L   Calcium, Ion 1.09 (L) 1.15 - 1.40 mmol/L   HCT 33.0 (L) 36.0 - 46.0 %   Hemoglobin 11.2 (L) 12.0 - 15.0 g/dL   Sample type VENOUS      DG Abdomen 1 View  Result Date: 05/12/2020 CLINICAL DATA:  Abdominal pain EXAM: ABDOMEN - 1 VIEW COMPARISON:  None. FINDINGS: There is moderate stool in the colon. There is no bowel dilatation or air-fluid level to suggest bowel obstruction. No free air. Probable small phleboliths noted in the pelvis. Lung bases clear. IMPRESSION: Moderate stool in colon.  No bowel obstruction or free air. Electronically Signed   By: Lowella Grip III M.D.   On: 05/12/2020 12:44    MDM  U/A with moderate leukocytes, greater than 50 white cells and many bacteria.  Appears to be clean-catch with 0-5 squamous.  Urine culture sent.  Per previous urine culture, appears to be susceptible to Keflex.  She is given dose of Rocephin here and discharged on Keflex.  Discharged in no acute distress.  Patient and her family member are agreeable with care plan and discharge.      Sharita Bienaime, [place] N, PA-C 05/13/20 2255  Malvin Johns, MD 05/15/20 [phone]

## 2020-05-12 NOTE — ED Provider Notes (Signed)
[facility] EMERGENCY DEPARTMENT Provider Note   CSN: [id number] Arrival date & time: 05/12/20  1108     History Chief Complaint  Patient presents with  . Constipation    Pt arrives via [facility] stretcher c/o constipation x4 days with N/V. Pt is diabetic and EMS reports CBG 426. Pt recently had R foot fx and is prescribed oxycodone post-op.   . Abdominal Pain    Jennifer [name] is a 50 y.o. female with a past medical history significant for A. fib on chronic Eliquis, diabetes, anemia, hyperlipidemia, and hypertension who presents to the ED due to abdominal pain associate with nausea and vomiting x4 days.  Patient admits to constipation with last bowel movement 6 days ago.  Patient has been taking oxycodone over the past few days after an open reduction of right ankle fracture. She admits to 4-5 episodes of non-bloody, non-bilious emesis daily x 4 days associated with generalized abdominal pain. She states she has been compliant with her diabetic medications.  No previous abdominal operations.  Denies fever and chills.  She has not received her COVID-vaccine.  Denies urinary and vaginal symptoms.  She has tried magnesium citrate with no relief of constipation.   History obtained from patient and past medical records. Translator used for entire encounter.    Past Medical History:  Diagnosis Date  . Anemia, iron deficiency   . Atrial fibrillation ([place])   . Blindness of left eye   . Decreased visual acuity    Left eye  . Depression   . 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 Hair loss   . History of fracture of clavicle 05/18/2015  . Hyperlipidemia   . Hypertension   . Iron deficiency anemia 05/13/2013  . Pain, dental 08/19/2018   Tooth pain/facial swelling: has poor dentition at baseline, history of dental abscess.  She has not seen a dentist  in about one year.  Dentist is on [address].  No fevers chills or systemic symptoms.  Diabetes has been poorly controlled for some time.  She said it has improved recently averaging around 140.  Called the dentist said they would not see patients until May 14th but the urgency o  . Pap smear abnormality of cervix with LGSIL   . Routine/ritual circumcision   . Type II diabetes mellitus ([place])   . Uveitis     Patient Active Problem List   Diagnosis Date Noted  . Closed trimalleolar fracture of right ankle 05/02/2020  . Pyelonephritis 04/09/2020  . Normocytic anemia 03/27/2020  . Cellulitis (Left 2nd toe) 03/27/2020  . Cellulitis of left foot 03/17/2020  . AKI (acute kidney injury) ([place]) 03/14/2020  . Acute hypotension 03/10/2020  . Right ankle injury 09/28/2019  . Bilateral lower extremity edema 06/07/2019  . Atrial fibrillation ([place]) 09/20/2018  . Migraine   . Hematuria 08/24/2018  . Vulvovaginitis 05/21/2018  . (HFpEF) heart failure with preserved ejection fraction ([place]) 01/01/2018  . 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  . High risk medication use 09/16/2017  . Pseudophakia of right eye 09/16/2017  . Left anterior shoulder pain 07/06/2017  . Hypertension associated with diabetes ([place]) 07/23/2016  . 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 Fatigue 04/02/2016  . 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 Vitamin D deficiency 10/30/2014  . Hair loss 03/24/2012  . Healthcare maintenance 02/26/2011  .  Hyperlipidemia 05/05/2006  . Type II diabetes mellitus ([place]) 04/22/1998    Past Surgical History:  Procedure Laterality Date  . CESAREAN SECTION     x 1  . EYE SURGERY Bilateral    Lasik  . MULTIPLE TOOTH EXTRACTIONS     bottom denture  . ORIF ANKLE FRACTURE Right 05/05/2020   Procedure: OPEN REDUCTION INTERNAL FIXATION (ORIF) ANKLE FRACTURE;  Surgeon: Shona Needles, MD;  Location: [facility];  Service: Orthopedics;  Laterality: Right;     OB History     Gravida  7   Para  4   Term  4   Preterm  0   AB  3   Living        SAB  3   IAB  0   Ectopic  0   Multiple      Live Births              Family History  Problem Relation Age of Onset  . Diabetes Father   . Hypertension Other     Social History   Tobacco Use  . Smoking status: Never Smoker  . Smokeless tobacco: Never Used  Vaping Use  . Vaping Use: Never used  Substance Use Topics  . Alcohol use: No  . Drug use: No    Home Medications Prior to Admission medications   Medication Sig Start Date End Date Taking? Authorizing Provider  ondansetron (ZOFRAN ODT) 4 MG disintegrating tablet Take 1 tablet (4 mg total) by mouth every 8 (eight) hours as needed for nausea or vomiting. 05/12/20  Yes Aberman, Caroline C, PA-C  polyethylene glycol (MIRALAX) 17 g packet Take 17 g by mouth daily. 05/12/20  Yes Aberman, Druscilla Brownie, PA-C  Accu-Chek FastClix Lancets MISC Check blood sugar 4 times a day 06/07/19   Jose Persia, MD  acetaminophen (TYLENOL) 500 MG tablet Take 500 mg by mouth every 6 (six) hours as needed for mild pain.    [provider]  amLODipine (NORVASC) 10 MG tablet TAKE 1 TABLET BY MOUTH EVERY DAY 05/06/20   Harvie Heck, MD  apixaban (ELIQUIS) 5 MG TABS tablet Take 1 tablet (5 mg total) by mouth 2 (two) times daily. 03/27/20 06/25/20  Jean Rosenthal, MD  atorvastatin (LIPITOR) 40 MG tablet Take 1 tablet (40 mg total) by mouth daily. 06/30/19   Jose Persia, MD  Blood Glucose Monitoring Suppl (ACCU-CHEK GUIDE) w/Device KIT 1 each by Does not apply route 4 (four) times daily. 11/19/18   Jose Persia, MD  brimonidine (ALPHAGAN) 0.2 % ophthalmic solution Place 1 drop into the left eye 2 (two) times daily. 02/01/20   [provider]  carboxymethylcellul-glycerin (OPTIVE) 0.5-0.9 % ophthalmic solution Place 1 drop into both eyes 2 (two) times daily as needed for dry eyes.    [provider]  dorzolamide-timolol (COSOPT) 22.3-6.8 MG/ML  ophthalmic solution Place 1 drop into the left eye 2 (two) times daily.    [provider]  Dulaglutide (TRULICITY) 1.5 FI/4.3PI SOPN Inject 1.5 mg into the skin once a week. 03/27/20 03/27/21  Jean Rosenthal, MD  ferrous sulfate 325 (65 FE) MG tablet Take 1 tablet (325 mg total) by mouth every Monday, Wednesday, and Friday for 14 days. 04/24/20 05/08/20  Mosetta Anis, MD  glucose blood (ACCU-CHEK GUIDE) test strip Check blood sugar 4 times per day 06/07/19   Jose Persia, MD  insulin NPH Human (NOVOLIN N) 100 UNIT/ML injection Inject 0.3 mLs (30 Units total) into the  skin daily before breakfast AND 0.15 mLs (15 Units total) daily with supper. You must eat with this medication!. 04/13/20   Jose Persia, MD  Insulin Pen Needle 31G X 5 MM MISC 1 Dose by Does not apply route 2 (two) times daily. 05/20/18   Alphonzo Grieve, MD  metFORMIN (GLUCOPHAGE) 1000 MG tablet Take 1 tablet (1,000 mg total) by mouth 2 (two) times daily with a meal. 04/24/20 04/24/21  Mosetta Anis, MD  Multiple Vitamins-Minerals (MULTIVITAMIN WITH MINERALS) tablet Take 1 tablet by mouth daily.    [provider]  oxyCODONE-acetaminophen (PERCOCET) 5-325 MG tablet Take 1 tablet by mouth every 6 (six) hours as needed for severe pain. 05/05/20   Delray Alt, PA-C  zinc gluconate 50 MG tablet Take 1 tablet (50 mg total) by mouth daily. 04/10/20   Jean Rosenthal, MD    Allergies    Patient has no known allergies.  Review of Systems   Review of Systems  Constitutional: Negative for chills and fever.  Respiratory: Negative for shortness of breath.   Cardiovascular: Negative for chest pain.  Gastrointestinal: Positive for abdominal pain, constipation, nausea and vomiting.  Genitourinary: Negative for dysuria and vaginal discharge.  All other systems reviewed and are negative.   Physical Exam Updated Vital Signs BP (!) 184/71   Pulse 96   Temp 99.5 F (37.5 C) (Oral)   Resp (!) 34   Ht 5' 6" (1.676 m)   Wt  98.9 kg   LMP 08/21/2016 (Exact Date)   SpO2 96%   BMI 35.19 kg/m   Physical Exam Vitals and nursing note reviewed.  Constitutional:      General: She is not in acute distress.    Appearance: She is not ill-appearing.  HENT:     Head: Normocephalic.  Eyes:     Pupils: Pupils are equal, round, and reactive to light.  Cardiovascular:     Rate and Rhythm: Normal rate and regular rhythm.     Pulses: Normal pulses.     Heart sounds: Normal heart sounds. No murmur heard. No friction rub. No gallop.   Pulmonary:     Effort: Pulmonary effort is normal.     Breath sounds: Normal breath sounds.  Abdominal:     General: Abdomen is flat. Bowel sounds are normal. There is no distension.     Palpations: Abdomen is soft.     Tenderness: There is abdominal tenderness. There is no guarding or rebound.     Comments: Generalized abdominal tenderness, most significant in lower abdomen. No rebound or guarding.   Musculoskeletal:        General: Normal range of motion.     Cervical back: Neck supple.  Skin:    General: Skin is warm and dry.  Neurological:     General: No focal deficit present.     Mental Status: She is alert.  Psychiatric:        Mood and Affect: Mood normal.        Behavior: Behavior normal.     ED Results / Procedures / Treatments   Labs (all labs ordered are listed, but only abnormal results are displayed) Labs Reviewed  CBC WITH DIFFERENTIAL/PLATELET - Abnormal; Notable for the following components:      Result Value   WBC 10.7 (*)    RBC 3.85 (*)    Hemoglobin 10.0 (*)    HCT 31.8 (*)    Neutro Abs 9.0 (*)    All other components within  normal limits  COMPREHENSIVE METABOLIC PANEL - Abnormal; Notable for the following components:   Sodium 133 (*)    Chloride 95 (*)    Glucose, Bld 369 (*)    Creatinine, Ser 1.22 (*)    Albumin 2.9 (*)    AST 10 (*)    GFR, Estimated 54 (*)    All other components within normal limits  CBG MONITORING, ED - Abnormal;  Notable for the following components:   Glucose-Capillary 342 (*)    All other components within normal limits  I-STAT VENOUS BLOOD GAS, ED - Abnormal; Notable for the following components:   pH, Ven 7.457 (*)    pCO2, Ven 37.7 (*)    pO2, Ven 48.0 (*)    Acid-Base Excess 3.0 (*)    Sodium 134 (*)    Calcium, Ion 1.09 (*)    HCT 33.0 (*)    Hemoglobin 11.2 (*)    All other components within normal limits  LIPASE, BLOOD  URINALYSIS, ROUTINE W REFLEX MICROSCOPIC    EKG None  Radiology DG Abdomen 1 View  Result Date: 05/12/2020 CLINICAL DATA:  Abdominal pain EXAM: ABDOMEN - 1 VIEW COMPARISON:  None. FINDINGS: There is moderate stool in the colon. There is no bowel dilatation or air-fluid level to suggest bowel obstruction. No free air. Probable small phleboliths noted in the pelvis. Lung bases clear. IMPRESSION: Moderate stool in colon.  No bowel obstruction or free air. Electronically Signed   By: Lowella Grip III M.D.   On: 05/12/2020 12:44    Procedures Procedures (including critical care time)  Medications Ordered in ED Medications  ondansetron (ZOFRAN) injection 4 mg (has no administration in time range)  sodium chloride 0.9 % bolus 1,000 mL (1,000 mLs Intravenous New Bag/Given 05/12/20 1307)    ED Course  I have reviewed the triage vital signs and the nursing notes.  Pertinent labs & imaging results that were available during my care of the patient were reviewed by me and considered in my medical decision making (see chart for details).  Clinical Course as of 05/12/20 1545  Fri May 12, 2020  1147 Glucose-Capillary(!): 342 [CA]  1403 WBC(!): 10.7 [CA]    Clinical Course User Index [CA] Karie Kirks   MDM Rules/Calculators/A&P                         50 year old female presents to the ED due to constipation, nausea, vomiting, generalized abdominal pain.  She has been taking oxycodone over the past few days after a right ankle reduction.  No fever or  chills.  Upon arrival, patient is afebrile, not tachycardic or hypoxic.  Patient in no acute distress and nontoxic-appearing.  Physical exam significant for generalized abdominal tenderness with significant lower abdomen with no rebound or guarding. Suspect symptoms related to opioid induced constipation, DKA, bowel obstruction, gastroenteritis. Routine labs ordered. CBG 324. IVFs given. KUB ordered to rule out obstruction. IVFs and Zofran given.  CBC significant for mild cytosis at 10.7 and mild anemia with hemoglobin at 10.  CMP significant for hyponatremia at 133, hyperglycemia 369 with no anion gap.  Doubt DKA.  Mild elevation in creatinine at 1.22.  IV fluids given.  Lipase normal at 15.  Doubt pancreatitis. KUB personally reviewed which is negative for any acute abnormalities, specifically no obstruction noted.   Patient handed off to [place] Robinson, PA-C at shift change pending UA. If normal, patient may be discharged with Miralax and  Zofran.  Discussed case with Dr. Tamera Punt who agrees with assessment and plan. Final Clinical Impression(s) / ED Diagnoses Final diagnoses:  Drug-induced constipation  Non-intractable vomiting with nausea, unspecified vomiting type  Hyperglycemia    Rx / DC Orders ED Discharge Orders         Ordered    polyethylene glycol (MIRALAX) 17 g packet  Daily        05/12/20 1516    ondansetron (ZOFRAN ODT) 4 MG disintegrating tablet  Every 8 hours PRN        05/12/20 1528           Karie Kirks 05/12/20 1548    Malvin Johns, MD 05/15/20 [phone]

## 2020-05-12 NOTE — Discharge Instructions (Addendum)
Your labs were reassuring today. Your urine does show signs of persisting infection. Your glucose was elevated, but you were not in DKA. Make sure to take your diabetic medication as prescribed. Your constipation is most likely related to the opioid medication you are taking. I recommend you take Tylenol for pain. I am sending you home with MiraLAX.  Take daily for the next 2 weeks. Take zofran/ondansetron every 8 hours as needed for nausea.   Starting tomorrow, take the antibiotic, cephalexin/keflex every 8 hours until gone. Please follow-up with PCP within the next week for further evaluation.  Return to the ER for new or worsening symptoms.

## 2020-05-12 NOTE — ED Notes (Signed)
Unsuccessful IV attempt x2. Second nurse contacted for attempt.

## 2020-05-14 LAB — URINE CULTURE: Culture: >=100000 — AB

## 2020-05-15 ENCOUNTER — Encounter: Payer: Self-pay | Admitting: Student

## 2020-05-15 ENCOUNTER — Observation Stay (HOSPITAL_COMMUNITY)
Admission: AD | Admit: 2020-05-15 | Discharge: 2020-05-17 | Disposition: A | Discharge status: Home / Self Care | Payer: Medicaid Other | Source: Ambulatory Visit | Attending: Internal Medicine | Admitting: Internal Medicine

## 2020-05-15 ENCOUNTER — Ambulatory Visit (INDEPENDENT_AMBULATORY_CARE_PROVIDER_SITE_OTHER): Payer: Medicaid Other | Admitting: Student

## 2020-05-15 ENCOUNTER — Inpatient Hospital Stay (HOSPITAL_COMMUNITY): Payer: Medicaid Other

## 2020-05-15 VITALS — BP 147/76 | HR 75 | Temp 98.6°F

## 2020-05-15 DIAGNOSIS — D509 Iron deficiency anemia, unspecified: Secondary | ICD-10-CM | POA: Diagnosis not present

## 2020-05-15 DIAGNOSIS — R5383 Other fatigue: Secondary | ICD-10-CM

## 2020-05-15 DIAGNOSIS — Z79899 Other long term (current) drug therapy: Secondary | ICD-10-CM | POA: Diagnosis not present

## 2020-05-15 DIAGNOSIS — S82851D Displaced trimalleolar fracture of right lower leg, subsequent encounter for closed fracture with routine healing: Secondary | ICD-10-CM

## 2020-05-15 DIAGNOSIS — N12 Tubulo-interstitial nephritis, not specified as acute or chronic: Secondary | ICD-10-CM

## 2020-05-15 DIAGNOSIS — Z794 Long term (current) use of insulin: Secondary | ICD-10-CM | POA: Diagnosis not present

## 2020-05-15 DIAGNOSIS — Z7984 Long term (current) use of oral hypoglycemic drugs: Secondary | ICD-10-CM | POA: Insufficient documentation

## 2020-05-15 DIAGNOSIS — E11319 Type 2 diabetes mellitus with unspecified diabetic retinopathy without macular edema: Secondary | ICD-10-CM

## 2020-05-15 DIAGNOSIS — N179 Acute kidney failure, unspecified: Secondary | ICD-10-CM

## 2020-05-15 DIAGNOSIS — R748 Abnormal levels of other serum enzymes: Secondary | ICD-10-CM | POA: Diagnosis not present

## 2020-05-15 DIAGNOSIS — R739 Hyperglycemia, unspecified: Secondary | ICD-10-CM | POA: Diagnosis present

## 2020-05-15 DIAGNOSIS — I10 Essential (primary) hypertension: Secondary | ICD-10-CM | POA: Diagnosis not present

## 2020-05-15 DIAGNOSIS — E1165 Type 2 diabetes mellitus with hyperglycemia: Principal | ICD-10-CM | POA: Diagnosis present

## 2020-05-15 DIAGNOSIS — N390 Urinary tract infection, site not specified: Secondary | ICD-10-CM | POA: Diagnosis not present

## 2020-05-15 DIAGNOSIS — Z1152 Encounter for screening for COVID-19: Secondary | ICD-10-CM

## 2020-05-15 DIAGNOSIS — I152 Hypertension secondary to endocrine disorders: Secondary | ICD-10-CM

## 2020-05-15 DIAGNOSIS — E1159 Type 2 diabetes mellitus with other circulatory complications: Secondary | ICD-10-CM

## 2020-05-15 DIAGNOSIS — D649 Anemia, unspecified: Secondary | ICD-10-CM | POA: Diagnosis not present

## 2020-05-15 DIAGNOSIS — E782 Mixed hyperlipidemia: Secondary | ICD-10-CM

## 2020-05-15 DIAGNOSIS — R3129 Other microscopic hematuria: Secondary | ICD-10-CM

## 2020-05-15 LAB — COMPREHENSIVE METABOLIC PANEL
ALT: 106 U/L — ABNORMAL HIGH (ref 0–44)
AST: 151 U/L — ABNORMAL HIGH (ref 15–41)
Albumin: 2.4 g/dL — ABNORMAL LOW (ref 3.5–5.0)
Alkaline Phosphatase: 364 U/L — ABNORMAL HIGH (ref 38–126)
Anion gap: 13 (ref 5–15)
BUN: 43 mg/dL — ABNORMAL HIGH (ref 6–20)
CO2: 23 mmol/L (ref 22–32)
Calcium: 8.6 mg/dL — ABNORMAL LOW (ref 8.9–10.3)
Chloride: 90 mmol/L — ABNORMAL LOW (ref 98–111)
Creatinine, Ser: 1.84 mg/dL — ABNORMAL HIGH (ref 0.44–1.00)
GFR, Estimated: 33 mL/min — ABNORMAL LOW (ref >60)
Glucose, Bld: 290 mg/dL — ABNORMAL HIGH (ref 70–99)
Potassium: 4.1 mmol/L (ref 3.5–5.1)
Sodium: 126 mmol/L — ABNORMAL LOW (ref 135–145)
Total Bilirubin: 0.6 mg/dL (ref 0.3–1.2)
Total Protein: 7.3 g/dL (ref 6.5–8.1)

## 2020-05-15 LAB — URINALYSIS, ROUTINE W REFLEX MICROSCOPIC
Bilirubin Urine: NEGATIVE
Glucose, UA: 150 mg/dL — AB
Ketones, ur: NEGATIVE mg/dL
Nitrite: NEGATIVE
Protein, ur: 30 mg/dL — AB
Specific Gravity, Urine: 1.012 (ref 1.005–1.030)
WBC, UA: >50 WBC/hpf — ABNORMAL HIGH (ref 0–5)
pH: 5 (ref 5.0–8.0)

## 2020-05-15 LAB — CBC WITH DIFFERENTIAL/PLATELET
Abs Immature Granulocytes: 0.03 10*3/uL (ref 0.00–0.07)
Basophils Absolute: 0 10*3/uL (ref 0.0–0.1)
Basophils Relative: 1 %
Eosinophils Absolute: 0.2 10*3/uL (ref 0.0–0.5)
Eosinophils Relative: 2 %
HCT: 28.7 % — ABNORMAL LOW (ref 36.0–46.0)
Hemoglobin: 8.7 g/dL — ABNORMAL LOW (ref 12.0–15.0)
Immature Granulocytes: 0 %
Lymphocytes Relative: 18 %
Lymphs Abs: 1.6 10*3/uL (ref 0.7–4.0)
MCH: 24.7 pg — ABNORMAL LOW (ref 26.0–34.0)
MCHC: 30.3 g/dL (ref 30.0–36.0)
MCV: 81.5 fL (ref 80.0–100.0)
Monocytes Absolute: 0.5 10*3/uL (ref 0.1–1.0)
Monocytes Relative: 6 %
Neutro Abs: 6.3 10*3/uL (ref 1.7–7.7)
Neutrophils Relative %: 73 %
Platelets: 197 10*3/uL (ref 150–400)
RBC: 3.52 MIL/uL — ABNORMAL LOW (ref 3.87–5.11)
RDW: 14 % (ref 11.5–15.5)
WBC: 8.7 10*3/uL (ref 4.0–10.5)
nRBC: 0 % (ref 0.0–0.2)

## 2020-05-15 LAB — IRON AND TIBC
Iron: 23 ug/dL — ABNORMAL LOW (ref 28–170)
Saturation Ratios: 10 % — ABNORMAL LOW (ref 10.4–31.8)
TIBC: 223 ug/dL — ABNORMAL LOW (ref 250–450)
UIBC: 200 ug/dL

## 2020-05-15 LAB — RESP PANEL BY RT-PCR (RSV, FLU A&B, COVID)  RVPGX2
Influenza A by PCR: NEGATIVE
Influenza B by PCR: NEGATIVE
Resp Syncytial Virus by PCR: NEGATIVE
SARS Coronavirus 2 by RT PCR: NEGATIVE

## 2020-05-15 LAB — POCT GLYCOSYLATED HEMOGLOBIN (HGB A1C): Hemoglobin A1C: 10.3 % — AB (ref 4.0–5.6)

## 2020-05-15 LAB — GAMMA GT: GGT: 88 U/L — ABNORMAL HIGH (ref 7–50)

## 2020-05-15 LAB — TSH: TSH: 1.88 u[IU]/mL (ref 0.350–4.500)

## 2020-05-15 LAB — T4, FREE: Free T4: 1.44 ng/dL — ABNORMAL HIGH (ref 0.61–1.12)

## 2020-05-15 LAB — FERRITIN: Ferritin: 179 ng/mL (ref 11–307)

## 2020-05-15 LAB — GLUCOSE, CAPILLARY
Glucose-Capillary: 270 mg/dL — ABNORMAL HIGH (ref 70–99)
Glucose-Capillary: 250 mg/dL — ABNORMAL HIGH (ref 70–99)

## 2020-05-15 MED ORDER — POLYETHYLENE GLYCOL 3350 17 G PO PACK
17.0000 g | PACK | Freq: Every day | ORAL | Status: DC | PRN
  Administered 2020-05-16: 17 g via ORAL
  Filled 2020-05-15: qty 1

## 2020-05-15 MED ORDER — INSULIN GLARGINE 100 UNIT/ML ~~LOC~~ SOLN
18.0000 [IU] | Freq: Every day | SUBCUTANEOUS | Status: DC
  Administered 2020-05-15 – 2020-05-16 (×2): 18 [IU] via SUBCUTANEOUS
  Filled 2020-05-15 (×3): qty 0.18

## 2020-05-15 MED ORDER — CEPHALEXIN 500 MG PO CAPS
500.0000 mg | ORAL_CAPSULE | Freq: Three times a day (TID) | ORAL | Status: DC
  Administered 2020-05-15 – 2020-05-16 (×3): 500 mg via ORAL
  Filled 2020-05-15 (×3): qty 1

## 2020-05-15 MED ORDER — ACETAMINOPHEN 325 MG PO TABS: 650.0000 mg | ORAL_TABLET | Freq: Four times a day (QID) | ORAL | Status: DC | PRN

## 2020-05-15 MED ORDER — LACTATED RINGERS IV BOLUS
500.0000 mL | Freq: Once | INTRAVENOUS | Status: AC
  Administered 2020-05-15: 500 mL via INTRAVENOUS

## 2020-05-15 MED ORDER — ENOXAPARIN SODIUM 30 MG/0.3ML ~~LOC~~ SOLN
30.0000 mg | SUBCUTANEOUS | Status: DC
  Administered 2020-05-15 – 2020-05-16 (×2): 30 mg via SUBCUTANEOUS
  Filled 2020-05-15 (×2): qty 0.3

## 2020-05-15 MED ORDER — INSULIN ASPART 100 UNIT/ML ~~LOC~~ SOLN
0.0000 [IU] | Freq: Three times a day (TID) | SUBCUTANEOUS | Status: DC
  Administered 2020-05-16: 3 [IU] via SUBCUTANEOUS
  Administered 2020-05-16: 5 [IU] via SUBCUTANEOUS
  Administered 2020-05-16: 3 [IU] via SUBCUTANEOUS

## 2020-05-15 MED ORDER — ACETAMINOPHEN 650 MG RE SUPP: 650.0000 mg | Freq: Four times a day (QID) | RECTAL | Status: DC | PRN

## 2020-05-15 NOTE — Assessment & Plan Note (Signed)
Patient has diffuse L>R abdominal pain with recent nausea, vomiting. Denies fevers, chills. Does have acute increase in ALT, AST in the 100's and significantly elevated ALP. Consider hepatobiliary cause such as hepatitis vs. Cholelithiasis / cholecystitis.   - Consider RUQ ultrasound  - Check COVID-19 PCR

## 2020-05-15 NOTE — Assessment & Plan Note (Addendum)
Prescribed amlodipine 10mg  daily; however, son notes that he checks her blood pressure at home and gives this to her intermittently, only when pressures are "high".  BP Readings from Last 3 Encounters:  05/15/20 (!) 147/76  05/12/20 (!) 169/72  05/05/20 (!) 120/99   - educated on the importance of taking this medication daily - continue to monitor BP's at home when no in the hospital

## 2020-05-15 NOTE — Assessment & Plan Note (Signed)
Patient has not been taking Atorvastatin 40mg  daily as prescribed.   - Will need medication reconciliation and education on medication compliance upon discharge

## 2020-05-15 NOTE — Assessment & Plan Note (Addendum)
Likely in the setting of persistently elevated blood sugars and urinary tract infection. Patient also not tolerating much in the way of PO intake and has hyponatremia, Na 126. CBC shows persistent normocytic anemia and iron studies most consistent with anemia of chronic disease.   - continue to monitor CMP, CBC

## 2020-05-15 NOTE — Patient Instructions (Signed)
Patient is being admitted for likely pyelonephritis and inability to tolerate PO intake.   Will require HFU appointment following discharge.

## 2020-05-15 NOTE — Progress Notes (Signed)
   CC: Abdominal Pain  HPI:  [name] is a 50 y.o. woman w/ PMHx notable for uncontrolled type II DM, bilateral pyelonephritis last month, HFpEF, A-Fib, HTN, HLD, and IDA, presenting for hospital follow up with chief complaint of abdominal pain. She was seen in the ED 05/12/20 after experiencing 4 days of nausea, vomiting, and abdominal pain with 6 days since her last bowel movement after recently starting Oxycodone she took once daily following ORIF RLE trimalleolar fracture 05/05/20. Her urine was positive for Klebsiella Pneumoniae and she was discharged with Zofran PRN and Cephalexin TID. Her nausea/vomiting have resolved with Zofran, constipation has improved slightly with Miralax and Fleet enemas at home, although she continues to have diffuse L > R abdominal pain, low back pain, fatigue, poor appetite, and decreased urination. Please see problem-based assessment / plan for full details.   Past Medical History:  Diagnosis Date  . Anemia, iron deficiency   . Atrial fibrillation ([place])   . Blindness of left eye   . Decreased visual acuity    Left eye  . Depression   . 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 Hair loss   . History of fracture of clavicle 05/18/2015  . Hyperlipidemia   . Hypertension   . Iron deficiency anemia 05/13/2013  . Pain, dental 08/19/2018   Tooth pain/facial swelling: has poor dentition at baseline, history of dental abscess.  She has not seen a dentist in about one year.  Dentist is on [address].  No fevers chills or systemic symptoms.  Diabetes has been poorly controlled for some time.  She said it has improved recently averaging around 140.  Called the dentist said they would not see patients until May 14th but the urgency o  . Pap smear abnormality of cervix with LGSIL   . Routine/ritual circumcision   . Type II diabetes  mellitus ([place])   . Uveitis    Review of Systems:  All others negative except as noted above in HPI and in assessment / plan.   Physical Exam:  Vitals:   05/15/20 1323  BP: (!) 147/76  Pulse: 75  Temp: 98.6 F (37 C)  TempSrc: Oral  SpO2: 100%   General: Patient is obese. She appears comfortable, although in no acute distress.  Eyes: Left iris is hazy blue. Sclera non-icteric. No conjunctival injection.  HENT: Neck is supple. No nasal discharge. Respiratory: Lungs are CTA, bilaterally. No wheezes, rales, or rhonchi. No increased work of breathing. Cardiovascular: Regular rate and rhythm. No murmurs, rubs, or gallops. No lower extremity edema. LLE is cool to touch. RLE has brace in place.  Neurological: Alert and oriented.  Musculoskeletal: No bony spinal TTP present. There is mild lumbar paraspinal muscular TTP L > R.  GU: There is no obvious CVA tenderness to palpation.  Abdominal: Soft and non-tender to palpation. Bowel sounds intact. No rebound or guarding. Skin: Desquamating rash present on bilateral palms.  Psych: Flat affect. Normal tone of voice.   Assessment & Plan:    See Encounters Tab for problem based charting.  Patient seen with Dr. Philipp Ovens.  Jeralyn Bennett, MD 05/15/2020, 4:36 PM Pager: [phone]

## 2020-05-15 NOTE — Assessment & Plan Note (Signed)
Creatinine elevated to 1.84 today, baseline ~1. Likely pre-renal in the setting of decreased PO intake although consider possibility of pyelonephritis given urinalysis remains positive for signs of infection. Moderate ketones in urine.   - Will likely require IVF hydration - check urine culture

## 2020-05-15 NOTE — Assessment & Plan Note (Addendum)
Most recent CT abdomen/pelvis 04/08/20 consistent with bilateral pyelonephritis, with focal low density in the posterolateral left kidney representing possible focal phlegmon. Concern for possible renal abscess in the setting of U/A remaining positive for signs of infection with hematuria and pyuria. Persistent positive Klebsiella pneumoniae cultures in the past and subjective decreased urine output recently concerning for possibility of obstruction vs. Medication non-compliance (patient taking cephalexin 1 tablet rather than TID at home).   - Awaiting repeat urine culture  - consider repeat CT +/- urography  - Will require admission to hospital for IV ABX

## 2020-05-15 NOTE — Progress Notes (Signed)
Jennifer [name] is a 50 y.o. female patient admitted. Awake, alert - oriented  X 4 - no acute distress noted.  VSS - Blood pressure (!) 153/70, pulse 82, temperature 98.6 F (37 C), resp. rate 18, last menstrual period 08/21/2016, SpO2 99 %.    IV in place, occlusive dsg intact without redness.  Orientation to room, and floor completed via translating machine.  Admission INP armband ID verified with patient/family, and in place.   SR up x 2, fall assessment complete, with patient and family able to verbalize understanding of risk associated with falls, and verbalized understanding to call nsg before up out of bed.  Call light within reach, patient able to voice, and demonstrate understanding. No evidence of skin break down noted on exam.  Admission nurse notified of admission.     Will cont to eval and treat per MD orders.  Gordy Savers, RN 05/15/2020 7:11 PM

## 2020-05-15 NOTE — Assessment & Plan Note (Signed)
Son states that she has follow up with orthopedic surgery this Friday 05/19/20. She is no longer taking Oxycodone-Acetaminophen 5-325mg  due to constipation, nausea, vomiting.   - Avoid Tylenol as much as possible given liver enzyme elevation

## 2020-05-15 NOTE — H&P (Signed)
Date: 05/15/2020               Patient Name:  Jennifer [name] MRN: [id number]  DOB: 06-27-1970 Age / Sex: 50 y.o., female   PCP: Jose Persia, MD         Medical Service: Internal Medicine Teaching Service         Attending Physician: Dr. Lucious Groves, DO    First Contact: Dr. Johnney Ou Pager: [phone]  Second Contact: Dr. Gilford Rile Pager: [phone]       After Hours (After 5p/  First Contact Pager: [phone]  weekends / holidays): Second Contact Pager: [phone]   Chief Complaint: Hyperglycemia  History of Present Illness:   Jennifer [name] is a  50 y/o female with a Hx of DMTII, a. Fib, depression, HLD, HTN, who presented to the clinic for hyperglycemic episodes. Patient is accompanied by her son, who translates for her.   Per her son, patient had been having sugars elevated to the 500s yesterday. She had underwent several insulin injections of her medium acting insulin with no resolution of her sugar levels. He does state that Jennifer siblings who take care of Jennifer [name] "give in" when she does not want to take her medications, and that he has thrown out all sugar in Jennifer [name]'s house today, but she will continue to bring in sugar from the outside.   Additionally, patient has experienced general malaise and having episodes of nausea without vomiting, which is chronic in nature. Her son attributes her malaise to her low blood counts, but that her iron pills make her nauseated. He would like to have her get biweekly fluid boluses and iron injections.   She was also seen in the ED earlier this month (1/21) for a UTI v pyelonephritis for which she was prescribed Keflex, but she has only taken 3 pills.  Lastly, he has noticed that Jennifer [name]'s hands have been peeling for the past five days. She did start her Keflex 3 days after her hands began to peel.   She denies headaches, dizziness, light headedness chest pain, SHOB or back pain.   She endorses back pain, fatigue, n/v,  bloating Meds: No outpatient medications have been marked as taking for the 05/15/20 encounter [facility] Encounter).   Norvasc 10 mg QD Eliquis  5 mg BID Lipitor 40 mg QD Keflex 732 mg TID Trulicity 1.5 KG/2.5 weekly Novolin 0.3 AM and 0.15 PM Metformin 1000 mg BID   Allergies: Allergies as of 05/15/2020  . (No Known Allergies)   Past Medical History:  Diagnosis Date  . Anemia, iron deficiency   . Atrial fibrillation ([place])   . Blindness of left eye   . Decreased visual acuity    Left eye  . Depression   . 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 Hair loss   . History of fracture of clavicle 05/18/2015  . Hyperlipidemia   . Hypertension   . Iron deficiency anemia 05/13/2013  . Pain, dental 08/19/2018   Tooth pain/facial swelling: has poor dentition at baseline, history of dental abscess.  She has not seen a dentist in about one year.  Dentist is on [address].  No fevers chills or systemic symptoms.  Diabetes has been poorly controlled for some time.  She said it has improved recently averaging around 140.  Called the dentist said they would not see  patients until May 14th but the urgency o  . Pap smear abnormality of cervix with LGSIL   . Routine/ritual circumcision   . Type II diabetes mellitus ([place])   . Uveitis     Family History:  Family History  Problem Relation Age of Onset  . Diabetes Father   . Hypertension Other     Social History:  Social History   Socioeconomic History  . Marital status: Married    Spouse name: Not on file  . Number of children: Not on file  . Years of education: Not on file  . Highest education level: Not on file  Occupational History  . Not on file  Tobacco Use  . Smoking status: Never Smoker  . Smokeless tobacco: Never Used  Vaping Use  . Vaping Use: Never used  Substance and Sexual Activity  .  Alcohol use: No  . Drug use: No  . Sexual activity: Not Currently    Birth control/protection: Post-menopausal  Other Topics Concern  . Not on file  Social History Narrative   ** Merged History Encounter **       ** Merged History Encounter **  Jennifer [name].    Social Determinants of Health   Financial Resource Strain: Not on file  Food Insecurity: Not on file  Transportation Needs: Not on file  Physical Activity: Not on file  Stress: Not on file  Social Connections: Not on file  Intimate Partner Violence: Not on file    Review of Systems: A complete ROS was negative except as per HPI.   Physical Exam: Blood pressure (!) 129/57, pulse 83, temperature 98.5 F (36.9 C), temperature source Oral, resp. rate 18, last menstrual period 08/21/2016, SpO2 92 %. Physical Exam Constitutional:      General: She is not in acute distress.    Appearance: Normal appearance. She is not ill-appearing, toxic-appearing or diaphoretic.  HENT:     Head: Normocephalic and atraumatic.  Cardiovascular:     Rate and Rhythm: Normal rate and regular rhythm.     Pulses: Normal pulses.     Heart sounds: Normal heart sounds. No murmur heard. No friction rub. No gallop.   Pulmonary:     Effort: Pulmonary effort is normal.     Breath sounds: Normal breath sounds. No wheezing, rhonchi or rales.  Chest:     Chest wall: No tenderness.  Abdominal:     General: Abdomen is flat. Bowel sounds are normal.     Palpations: Abdomen is soft.     Tenderness: There is no guarding.  Musculoskeletal:     Right lower leg: No edema.     Left lower leg: No edema.     Comments: desquamates palms No CVA tenderness  Neurological:     Mental Status: She is alert.     Assessment & Plan by Problem: Active Problems:   Hyperglycemia due to diabetes mellitus [facility])  Jennifer [name] is a  50 y/o female with a Hx of DMTII, a. Fib, depression, HLD, HTN, who presented to the clinic for hyperglycemic episodes, and  admitted for hyperglycemia.   Hyperglycemia in the Setting of DMTII:  Patient with uncontrolled diabetes with multiple A1cs >14 with most recent of 10.8 presents with hyperglycemia at home in the 500s and in the clinic 290. She is on multiple medications, with a known Hx of nonadherence. Her labs are not striking for DKA, there are no ketones on urine, bicarb is normal, and no anion gap. Will  admit for hyperglycemia control.  - Lantus 18U  - Moderate SSI   UTI vs Pyelonephritis:  Patient presents with non adherence to her Keflex in the setting of UTI vs early pyelonephritis. She does not have a white count, fevers, CVA tenderness, dysuria, or urinary frequency. Her previous urine culture grew our Klebsiella pneumoniae that is S for cefazolin. Urinalysis today shows large leukocytes, >50 WBC, and moderate Hgb despite RBCs of 6-10. Will obtain renal U/S to rule out abscess, urine labs and culture ordered in the clinic, will follow up. Given moderate Hgb with not significantly elevated RBC, will get CK. Start back her Keflex.  - Renal US - Keflex 572m TID  - F/U Urine culture - CK  Elevated Transaminases with Elevated Alk Phos:  Mildly elevate AST/ALT (151/106) and significantly elevated Alk Phos (364) on lab results. She endorses some abdominal pain, but nothing in the RUQ, Murphy negative. Patient states that she does not have a gallbladder, but there may be a language barrier as it was difficult to clarify if she had a surgery. Viral etiology is concerning as well as cholecystitis/cholestasis and patient does complain of n/v with some of her meals. Will order hepatitis panel with COVID 19. Given the mild elevated ast/alt ratio likely not hepatitis A or ETOH use.  - RUQ [place]- Hep A - Hep Bs AG, Hep B s AB, Hep B core AB,  - Hep C RNA quant - GGT - COVID 19  Fatigue Hyponatremia Normocytic anemia:  Patient normocytic anemia of 8.7 from 11.2 presents to the clinic with malaise.  iron studies  consistent with AoCD. Given her malaise, will also check for hypothyroid. Additionally, patient has hyponatremia with a Na of 131 when corrected for her hyperglycemia, likely in the setting of dehydration. Will give gentle fluid resuscitation.  - TSH, free t4 - F/U Iron and IBC,  Ferritin - LR 500 mL bolus  Desquamating Rash Hands:  Patient presents with 5 days of desquamating rash of her bilateral hands. Rash contained to just the palms. It is pruritic with patient picking at it throughout the interview. No oral lesions or fevers to suggest kawasaki disease (not the classical pediatric age group) or Coxsackie virus. She started taking her Keflex after the rash presented, which lowers the differential for a drug reaction. Will need to ascertain whether or not she has tried any new cleaning products, fragrances, or detergents to see if this is a hypersensitivity reaction.  - Continue to monitor - Will address possible hypersensitivity causes.   Dispo: Admit patient to Inpatient with expected length of stay greater than 2 midnights.  Signed: WMaudie Mercury MD 05/15/2020, 8:31 PM  Pager: [phone] 5pm on weekdays and 1pm on weekends: On Call pager: [phone]

## 2020-05-15 NOTE — Assessment & Plan Note (Signed)
See "fatigue" 

## 2020-05-15 NOTE — Assessment & Plan Note (Signed)
Son states that her blood pressures had been elevated up in the 500 range yesterday. She takes 30 units Novolin in the morning and 15 units Novolin in the evening; however, son up-titrated Novolin to 35 units last night with improvement in sugars to 200's this morning. Historically had better controlled sugars last month on mealtime insulin; however had frequent hypoglycemia on this regimen.   - Elevated sugars recently may be in setting of acute illness - Would likely benefit from CBG monitoring  - Will likely require insulin adjustments upon hospital discharge

## 2020-05-16 ENCOUNTER — Observation Stay (HOSPITAL_COMMUNITY): Payer: Medicaid Other

## 2020-05-16 DIAGNOSIS — N390 Urinary tract infection, site not specified: Secondary | ICD-10-CM | POA: Diagnosis present

## 2020-05-16 DIAGNOSIS — R319 Hematuria, unspecified: Secondary | ICD-10-CM | POA: Diagnosis present

## 2020-05-16 DIAGNOSIS — N179 Acute kidney failure, unspecified: Secondary | ICD-10-CM | POA: Diagnosis not present

## 2020-05-16 DIAGNOSIS — R5383 Other fatigue: Secondary | ICD-10-CM

## 2020-05-16 DIAGNOSIS — E1165 Type 2 diabetes mellitus with hyperglycemia: Principal | ICD-10-CM

## 2020-05-16 DIAGNOSIS — D649 Anemia, unspecified: Secondary | ICD-10-CM

## 2020-05-16 DIAGNOSIS — R21 Rash and other nonspecific skin eruption: Secondary | ICD-10-CM

## 2020-05-16 LAB — HEPATITIS A ANTIBODY, IGM: Hep A IgM: NONREACTIVE

## 2020-05-16 LAB — CBC
HCT: 24.2 % — ABNORMAL LOW (ref 36.0–46.0)
Hemoglobin: 7.9 g/dL — ABNORMAL LOW (ref 12.0–15.0)
MCH: 25.8 pg — ABNORMAL LOW (ref 26.0–34.0)
MCHC: 32.6 g/dL (ref 30.0–36.0)
MCV: 79.1 fL — ABNORMAL LOW (ref 80.0–100.0)
Platelets: 191 10*3/uL (ref 150–400)
RBC: 3.06 MIL/uL — ABNORMAL LOW (ref 3.87–5.11)
RDW: 14.1 % (ref 11.5–15.5)
WBC: 7.2 10*3/uL (ref 4.0–10.5)
nRBC: 0 % (ref 0.0–0.2)

## 2020-05-16 LAB — BASIC METABOLIC PANEL
Anion gap: 10 (ref 5–15)
BUN: 39 mg/dL — ABNORMAL HIGH (ref 6–20)
CO2: 25 mmol/L (ref 22–32)
Calcium: 8.3 mg/dL — ABNORMAL LOW (ref 8.9–10.3)
Chloride: 94 mmol/L — ABNORMAL LOW (ref 98–111)
Creatinine, Ser: 1.42 mg/dL — ABNORMAL HIGH (ref 0.44–1.00)
GFR, Estimated: 45 mL/min — ABNORMAL LOW (ref >60)
Glucose, Bld: 266 mg/dL — ABNORMAL HIGH (ref 70–99)
Potassium: 4.2 mmol/L (ref 3.5–5.1)
Sodium: 129 mmol/L — ABNORMAL LOW (ref 135–145)

## 2020-05-16 LAB — HEPATITIS B SURFACE ANTIGEN: Hepatitis B Surface Ag: NONREACTIVE

## 2020-05-16 LAB — GLUCOSE, CAPILLARY
Glucose-Capillary: 220 mg/dL — ABNORMAL HIGH (ref 70–99)
Glucose-Capillary: 177 mg/dL — ABNORMAL HIGH (ref 70–99)
Glucose-Capillary: 152 mg/dL — ABNORMAL HIGH (ref 70–99)
Glucose-Capillary: 207 mg/dL — ABNORMAL HIGH (ref 70–99)

## 2020-05-16 LAB — HEPATITIS B CORE ANTIBODY, IGM: Hep B C IgM: NONREACTIVE

## 2020-05-16 LAB — HEPATITIS B CORE ANTIBODY, TOTAL: Hep B Core Total Ab: NONREACTIVE

## 2020-05-16 MED ORDER — IOHEXOL 300 MG/ML  SOLN
100.0000 mL | Freq: Once | INTRAMUSCULAR | Status: AC | PRN
  Administered 2020-05-16: 100 mL via INTRAVENOUS

## 2020-05-16 MED ORDER — SODIUM CHLORIDE 0.9 % IV SOLN
2.0000 g | INTRAVENOUS | Status: DC
  Administered 2020-05-16: 2 g via INTRAVENOUS
  Filled 2020-05-16: qty 20
  Filled 2020-05-16: qty 2

## 2020-05-16 NOTE — Progress Notes (Signed)
HD#1 Subjective:  Overnight Events: Admitted Yesterday From [facility]   Patient evaluated this am, resting comfortably with son at bedside. Patient's son translated during this encounter. Patient notes she is feeling better, with some residual lower pelvic pain. She denies fever, chills, or back pain. Patient's son states that for the past few days the patient's sugars have been quite high in the 400's, he would attempt to give her medium insulin injections and noticed her sugars would continue to rise.    I explained to the patient and her son that she was admitted for further assess why she has persistent urinary tract infections and perform imaging studies to look at the kidneys and make sure there was no obstruction or other possible cause for these infections.   It was then explained that if these were negative that we suspected the recurrent infections were due to her high sugar levels, creating an optimal environment for infection. Her son asked about having fluid boluses so the patient would not have to frequently come into clinic or go to the ED for IV fluids. We explained that the patient was more than likely getting dehydrated from the persistently high sugars. When asked what the clinic could do better to help her stay adherent to the regimen, the patient requested simplifying her medication list.   Patient also questions about peeling skin on hands. This occurred prior to patient starting her previous antibiotic regimen of bactrim.   Objective:  Vital signs in last 24 hours: Vitals:   05/15/20 1934 05/16/20 0055 05/16/20 0503 05/16/20 1340  BP: (!) 129/57 133/68 (!) 132/58 137/67  Pulse: 83 85 81 79  Resp: _0 Temp: 98.5 F (36.9 C) 99.3 F (37.4 C) 98.7 F (37.1 C) 99.3 F (37.4 C)  TempSrc: Oral Oral Oral Oral  SpO2: 92% 94% 90% 93%   Supplemental O2: Room Air SpO2: 93 %   Physical Exam:  Constitutional: well-appearing resting in bed comfortably, in no acute  distress HENT: normocephalic atraumatic, mucous membranes moist. No oral lesions appreciated Eyes: conjunctiva non-erythematous Cardiovascular: regular rate and rhythm, no m/r/g Pulmonary/Chest: normal work of breathing on room air Abdominal: soft, non-distended, suprapubic tenderness Neurological: alert & oriented x 3 Skin: warm and dry, peeling skin. No active full thickness sloughing appreciated. No erythema or tenderness.  Psych: normal mood  There were no vitals filed for this visit.   Intake/Output Summary (Last 24 hours) at 05/16/2020 1526 Last data filed at 05/16/2020 0930 Gross per 24 hour  Intake 941.96 ml  Output 450 ml  Net 491.96 ml   Net IO Since Admission: 491.96 mL [05/16/20 1526]  Pertinent Labs: CBC Latest Ref Rng & Units 05/16/2020 05/15/2020 05/12/2020  WBC 4.0 - 10.5 K/uL 7.2 8.7 -  Hemoglobin 12.0 - 15.0 g/dL 7.9(L) 8.7(L) 11.2(L)  Hematocrit 36.0 - 46.0 % 24.2(L) 28.7(L) 33.0(L)  Platelets 150 - 400 K/uL 191 197 -    CMP Latest Ref Rng & Units 05/16/2020 05/15/2020 05/12/2020  Glucose 70 - 99 mg/dL 266(H) 290(H) -  BUN 6 - 20 mg/dL 39(H) 43(H) -  Creatinine 0.44 - 1.00 mg/dL 1.42(H) 1.84(H) -  Sodium 135 - 145 mmol/L 129(L) 126(L) 134(L)  Potassium 3.5 - 5.1 mmol/L 4.2 4.1 4.2  Chloride 98 - 111 mmol/L 94(L) 90(L) -  CO2 22 - 32 mmol/L 25 23 -  Calcium 8.9 - 10.3 mg/dL 8.3(L) 8.6(L) -  Total Protein 6.5 - 8.1 g/dL - 7.3 -  Total Bilirubin 0.3 -  1.2 mg/dL - 0.6 -  Alkaline Phos 38 - 126 U/L - 364(H) -  AST 15 - 41 U/L - 151(H) -  ALT 0 - 44 U/L - 106(H) -    Imaging: US RENAL  Result Date: 05/15/2020 CLINICAL DATA:  Urinary tract infection. Recent diagnosis of pyelonephritis. Increased leukocytes and red cells on urinalysis today. EXAM: RENAL / URINARY TRACT ULTRASOUND COMPLETE COMPARISON:  04/10/2020 FINDINGS: Right Kidney: Renal measurements: 10.9 x 5.9 x 5.6 cm = volume: 190 mL. Somewhat heterogeneous appearance of the parenchymal echotexture without  parenchymal thinning. No focal collection identified. No solid mass or hydronephrosis. Left Kidney: Renal measurements: 10.5 x 6.4 x 5.9 cm = volume: 207 mL. Normal parenchymal echotexture and thickness. No focal lesion or hydronephrosis. Minimal free fluid suggested around the superior pole. No loculated collections. Bladder: Appears normal for degree of bladder distention. Other: None. IMPRESSION: 1. No focal lesion or hydronephrosis of either kidney.  No abscess. 2. Minimal free fluid suggested around the superior pole of the left kidney of doubtful significance. Electronically Signed   By: Lucienne Capers M.D.   On: 05/15/2020 22:09    Assessment/Plan:   Active Problems:   Hyperglycemia due to diabetes mellitus [facility])   Patient Summary: [name] is a  50 y/o female with a Hx of DMTII, a. Fib, depression, HLD, HTN, who presented to the clinic for hyperglycemic episodes, and admitted for hyperglycemia.   Hyperglycemia in the Setting of DMTII:  Patient's son state patient has difficulty controlling sugars at home due diet adherence. Patient also requests to have her diabetes medications changed to a pen rather than needle/syringe/vial. Also seems patient needs short acting insulin with medium acting. Will consider starting 70/30 insulin pen at discharge. -continue lantus 18U  -continue moderate SSI   UTI vs Pyelonephritis:  Patient states she feels better, but still has suprapubic tenderness. No CVA tenderness, fever, chills. Urinalysis with leukocytosis. Culture pending. Renal US with no lesion, abscess or hydronephrosis. Appears to be small amount of fluid around left kidney. Because of this will have additional imaging done, CT hematuria. Patient's hyperglycemia contributing to recurrent infections. No real resistance on prior culture despite multiple abx courses. Patient continues to be symptomatic and was only taking keflex once daily, because of this, will continue to treat with keflex  500 mg TID for 5 days. -CT Hematuria pending.  -continue Keflex 598m TID (day 1/5) - F/U Urine culture  Elevated Transaminases with Elevated Alk Phos S/P Cholecystectomy Mildly elevate AST/ALT (151/106) and significantly elevated Alk Phos (364) on lab results. GGT of 88, affirming hepatic etiology. Hepatitis A/B antibodies negative. Hepatitis C pending. No RUQ tenderness appreciated. Does appear to have cholestatic pattern. Due to patient's absence of RUQ tenderness, would recommend outpatient RUQ [place]   Fatigue Hyponatremia Normocytic anemia:  Patient normocytic anemia of 8.7 from 11.2 presents to the clinic with malaise. Iron studies consistent with AoCD. TSH normal.  -daily CBC -daily BMP  Desquamating Rash Hands:  Patient presents with 5 days of desquamating rash of her bilateral hands. Does not appear to be acute (see exam above). Possibly due to medications given after patient's right foot procedure. Oral mucosa clear of any lesions, low suspicion for SJS.  - Continue to monitor   Diet: Carb-Modified IVF: None,None VTE: Enoxaparin Code: Full Family Update: Son at bedside  Dispo: Plan for dispo in 0-1 days pending further renal imaging.  VTracyInternal Medicine Resident PGY-1 Pager [phone] contact the on call  pager after 5 pm and on weekends at [phone].

## 2020-05-16 NOTE — Progress Notes (Addendum)
Internal Medicine Clinic Attending  I saw and evaluated the patient.  I personally confirmed the key portions of the history and exam documented by Dr. Konrad Penta and I reviewed pertinent patient test results.  The assessment, diagnosis, and plan were formulated together and I agree with the documentation in the resident's note.  Patient is a 50 year old female known to me from prior admission last month presenting today with nausea vomiting and abdominal pain.  She was initially admitted on 11/22 for DKA.  She had fever, abdominal pain, nausea and vomiting that admission with urine culture growing multiple species.  CT abdomen showed mild perinephric stranding concerning for early pyelonephritis. She also had a left foot cellulitis and was treated with IV vancomycin and cefepime.  Transition to IV cefazolin and then discharged on Keflex.  She was then readmitted on 12/18 for bilateral pyelonephritis and acute oliguric renal failure.  Urine culture that admission grew Klebsiella pneumoniae resistant to ampicillin and nitrofurantoin.  Repeat CT abdomen and pelvis showed findings consistent with bilateral pyelonephritis as well as a focal low-density area in the left kidney measuring 8 mm concerning for developing abscess.  Renal ultrasound was negative, no evidence of obstruction. Nephrology was consulted. Fortunately she did not require hemodialysis.  Her urine output and renal function both improved.  She was discharged on 12/23 with Keflex to complete her treatment course.  Last week she developed recurrent nausea vomiting and abdominal pain.  She was seen in the ED found to be hyperglycemic with UA concerning for recurrent infection.  Again she was discharged with a prescription for Keflex presented today for follow-up.  Urinalysis from that ED visit again grew Klebsiella.  Today, she is accompanied by her son who helps provide history.  He reports her symptoms significantly improved after starting Keflex,  however she has been taking this only once a day instead of TID as prescribed. He also reports skin peeling on her hands (see picture under media tab) which started recently, but son believes the onset was prior to starting her most recent antibiotic course.   Overall, I am concerned for recurrent pyelonephritis given her history, likely partially treated by suboptimal antibiotic compliance. She needs repeat CT abdomen pelvis / possible CT urography to rule out abscess or obstruction. It is possible her infections are from uncontrolled type II DM and non compliance with outpatient oral antibiotics. However with her prior CT findings concerning for small developing abscess this definitely needs to be evaluated. May benefit from urology consult vs. Outpatient referral as well for cystoscopy to rule out any intraluminal lesions not visualized on CT. Lastly, with her desquamative hand lesions, she needs to be monitored for SJS with on going antibiotic treatment. She was not evaluated for oral lesions in clinic prior to being transferred upstairs, but we have asked the inpatient team to assess.

## 2020-05-17 ENCOUNTER — Encounter: Payer: Medicaid Other | Admitting: Internal Medicine

## 2020-05-17 DIAGNOSIS — E1165 Type 2 diabetes mellitus with hyperglycemia: Secondary | ICD-10-CM | POA: Diagnosis not present

## 2020-05-17 LAB — CBC
HCT: 24.5 % — ABNORMAL LOW (ref 36.0–46.0)
Hemoglobin: 7.4 g/dL — ABNORMAL LOW (ref 12.0–15.0)
MCH: 24.7 pg — ABNORMAL LOW (ref 26.0–34.0)
MCHC: 30.2 g/dL (ref 30.0–36.0)
MCV: 81.9 fL (ref 80.0–100.0)
Platelets: 242 10*3/uL (ref 150–400)
RBC: 2.99 MIL/uL — ABNORMAL LOW (ref 3.87–5.11)
RDW: 14.3 % (ref 11.5–15.5)
WBC: 6.5 10*3/uL (ref 4.0–10.5)
nRBC: 0 % (ref 0.0–0.2)

## 2020-05-17 LAB — BASIC METABOLIC PANEL
Anion gap: 11 (ref 5–15)
BUN: 26 mg/dL — ABNORMAL HIGH (ref 6–20)
CO2: 25 mmol/L (ref 22–32)
Calcium: 8.5 mg/dL — ABNORMAL LOW (ref 8.9–10.3)
Chloride: 102 mmol/L (ref 98–111)
Creatinine, Ser: 1.1 mg/dL — ABNORMAL HIGH (ref 0.44–1.00)
GFR, Estimated: >60 mL/min (ref >60)
Glucose, Bld: 132 mg/dL — ABNORMAL HIGH (ref 70–99)
Potassium: 3.9 mmol/L (ref 3.5–5.1)
Sodium: 138 mmol/L (ref 135–145)

## 2020-05-17 LAB — GLUCOSE, CAPILLARY
Glucose-Capillary: 115 mg/dL — ABNORMAL HIGH (ref 70–99)
Glucose-Capillary: 117 mg/dL — ABNORMAL HIGH (ref 70–99)

## 2020-05-17 LAB — HCV RNA QUANT: HCV Quantitative: NOT DETECTED IU/mL (ref >50)

## 2020-05-17 LAB — HEPATITIS B SURFACE ANTIBODY, QUANTITATIVE: Hep B S AB Quant (Post): <3.1 m[IU]/mL — ABNORMAL LOW (ref >9.9)

## 2020-05-17 MED ORDER — INSULIN ASPART PROT & ASPART (70-30 MIX) 100 UNIT/ML PEN: PEN_INJECTOR | SUBCUTANEOUS | 11 refills | Status: DC

## 2020-05-17 MED ORDER — NOVOLIN 70/30 (70-30) 100 UNIT/ML ~~LOC~~ SUSP: SUBCUTANEOUS | 3 refills | Status: DC

## 2020-05-17 MED ORDER — COVID-19 MRNA VACC (MODERNA) 100 MCG/0.5ML IM SUSP
0.5000 mL | Freq: Once | INTRAMUSCULAR | Status: AC
  Administered 2020-05-17: 0.5 mL via INTRAMUSCULAR
  Filled 2020-05-17: qty 0.5

## 2020-05-17 MED ORDER — SODIUM CHLORIDE 0.9 % IV SOLN
510.0000 mg | Freq: Once | INTRAVENOUS | Status: AC
  Administered 2020-05-17: 510 mg via INTRAVENOUS
  Filled 2020-05-17: qty 17

## 2020-05-17 MED ORDER — SODIUM CHLORIDE 0.9 % IV SOLN
1.0000 g | INTRAVENOUS | Status: DC
  Filled 2020-05-17: qty 10

## 2020-05-17 MED ORDER — CEFDINIR 300 MG PO CAPS: 300.0000 mg | ORAL_CAPSULE | Freq: Two times a day (BID) | ORAL | 0 refills | Status: DC

## 2020-05-17 MED ORDER — METFORMIN HCL ER 500 MG PO TB24: 500.0000 mg | ORAL_TABLET | Freq: Every day | ORAL | 11 refills | Status: DC

## 2020-05-17 NOTE — Progress Notes (Incomplete)
HD#1 Subjective:   No acute events overnight.    Patient evaluated this am, resting comfortably with son at bedside. She states she is feeling better today. Discussed antibiotic regimen for bilateral pyelonephritis.   Discussed diabetes regimen. Patient states she takes Trulicity once weekly on Mondays. She has taken metformin previously however had GI upset so it was stopped with improvement in symptoms.   Objective:  Vital signs in last 24 hours: Vitals:   05/16/20 0503 05/16/20 1340 05/16/20 1651 05/16/20 2017  BP: (!) 132/58 137/67 130/69 (!) 142/62  Pulse: 81 79 75 78  Resp: '18 20 18 17  ' Temp: 98.7 F (37.1 C) 99.3 F (37.4 C) 98.8 F (37.1 C) 98.2 F (36.8 C)  TempSrc: Oral Oral Oral   SpO2: 90% 93% 100% 99%   Supplemental O2: Room Air SpO2: 99 %   Physical Exam:  Constitutional: well-appearing resting in bed comfortably, in no acute distress HENT: normocephalic atraumatic, mucous membranes moist. No oral lesions appreciated Eyes: conjunctiva non-erythematous Cardiovascular: regular rate and rhythm, no m/r/g Pulmonary/Chest: normal work of breathing on room air Abdominal: soft, non-distended, suprapubic tenderness Neurological: alert & oriented x 3 Skin: warm and dry, peeling skin. No active full thickness sloughing appreciated. No erythema or tenderness.  Psych: normal mood  There were no vitals filed for this visit.   Intake/Output Summary (Last 24 hours) at 05/17/2020 0700 Last data filed at 05/16/2020 0930 Gross per 24 hour  Intake 240 ml  Output -  Net 240 ml   Net IO Since Admission: 491.96 mL [05/17/20 0700]  Pertinent Labs: CBC Latest Ref Rng & Units 05/17/2020 05/16/2020 05/15/2020  WBC 4.0 - 10.5 K/uL 6.5 7.2 8.7  Hemoglobin 12.0 - 15.0 g/dL 7.4(L) 7.9(L) 8.7(L)  Hematocrit 36.0 - 46.0 % 24.5(L) 24.2(L) 28.7(L)  Platelets 150 - 400 K/uL 242 191 197    CMP Latest Ref Rng & Units 05/17/2020 05/16/2020 05/15/2020  Glucose 70 - 99 mg/dL 132(H)  266(H) 290(H)  BUN 6 - 20 mg/dL 26(H) 39(H) 43(H)  Creatinine 0.44 - 1.00 mg/dL 1.10(H) 1.42(H) 1.84(H)  Sodium 135 - 145 mmol/L 138 129(L) 126(L)  Potassium 3.5 - 5.1 mmol/L 3.9 4.2 4.1  Chloride 98 - 111 mmol/L 102 94(L) 90(L)  CO2 22 - 32 mmol/L '25 25 23  ' Calcium 8.9 - 10.3 mg/dL 8.5(L) 8.3(L) 8.6(L)  Total Protein 6.5 - 8.1 g/dL - - 7.3  Total Bilirubin 0.3 - 1.2 mg/dL - - 0.6  Alkaline Phos 38 - 126 U/L - - 364(H)  AST 15 - 41 U/L - - 151(H)  ALT 0 - 44 U/L - - 106(H)    Imaging: CT HEMATURIA WORKUP  Result Date: 05/16/2020 CLINICAL DATA:  Microscopic hematuria. EXAM: CT ABDOMEN AND PELVIS WITHOUT AND WITH CONTRAST TECHNIQUE: Multidetector CT imaging of the abdomen and pelvis was performed following the standard protocol before and following the bolus administration of intravenous contrast. CONTRAST:  173m OMNIPAQUE IOHEXOL 300 MG/ML  SOLN COMPARISON:  04/08/2020 FINDINGS: Lower Chest: No acute findings. Hepatobiliary: No hepatic masses identified. Gallbladder is unremarkable. No evidence of biliary ductal dilatation. Pancreas:  No mass or inflammatory changes. Spleen: Within normal limits in size and appearance. Adrenals/Urinary Tract: No adrenal masses identified. No evidence of urolithiasis or hydronephrosis. Multifocal ill-defined areas of decreased parenchymal enhancement are seen in both kidneys, right side greater than left. Mild asymmetric right perinephric stranding is also seen. These findings show no significant change compared to prior study, and are consistent bilateral pyelonephritis. No  renal abscess or mass identified . No masses seen involving the collecting systems, ureters, or bladder. Stomach/Bowel: No evidence of obstruction, inflammatory process or abnormal fluid collections. Normal appendix visualized. Vascular/Lymphatic: No pathologically enlarged lymph nodes. No abdominal aortic aneurysm. Reproductive:  No mass or other significant abnormality. Other:  None.  Musculoskeletal:  No suspicious bone lesions identified. IMPRESSION: Bilateral pyelonephritis, right side greater than left, without significant change since prior study. No evidence of renal abscess or hydronephrosis. Electronically Signed   By: Marlaine Hind M.D.   On: 05/16/2020 18:40    Assessment/Plan:   Active Problems:   Hyperglycemia due to diabetes mellitus ([place])   UTI (urinary tract infection)   Patient Summary: Ms. Cheaney is a  50 y/o female with a Hx of DMTII, a. Fib, depression, HLD, HTN, who presented to the clinic for hyperglycemic episodes, and admitted for hyperglycemia.   Hyperglycemia in the Setting of DMTII:  Patient's son state patient has difficulty controlling sugars at home due diet adherence. Patient also requests to have her diabetes medications changed to a pen rather than needle/syringe/vial. Also seems patient needs short acting insulin with medium acting. Will consider starting 70/30 insulin pen at discharge. -continue lantus 18U  -continue moderate SSI   UTI vs Pyelonephritis:  Patient states she feels better, but still has suprapubic tenderness. No CVA tenderness, fever, chills. Urinalysis with leukocytosis. Culture pending. Renal US with no lesion, abscess or hydronephrosis. Appears to be small amount of fluid around left kidney. Because of this will have additional imaging done, CT hematuria. Patient's hyperglycemia contributing to recurrent infections. No real resistance on prior culture despite multiple abx courses. Patient continues to be symptomatic and was only taking keflex once daily, because of this, will continue to treat with keflex 500 mg TID for 5 days. -CT Hematuria pending.  -continue Keflex 574m TID (day 1/5) - F/U Urine culture  Elevated Transaminases with Elevated Alk Phos S/P Cholecystectomy Mildly elevate AST/ALT (151/106) and significantly elevated Alk Phos (364) on lab results. GGT of 88, affirming hepatic etiology. Hepatitis A/B  antibodies negative. Hepatitis C pending. No RUQ tenderness appreciated. Does appear to have cholestatic pattern. Due to patient's absence of RUQ tenderness, would recommend outpatient RUQ [place]   Fatigue Hyponatremia Normocytic anemia:  Patient normocytic anemia of 8.7 from 11.2 presents to the clinic with malaise. Iron studies consistent with AoCD. TSH normal.  -daily CBC -daily BMP  Desquamating Rash Hands:  Patient presents with 5 days of desquamating rash of her bilateral hands. Does not appear to be acute (see exam above). Possibly due to medications given after patient's right foot procedure. Oral mucosa clear of any lesions, low suspicion for SJS.  - Continue to monitor   Diet: Carb-Modified IVF: None,None VTE: Enoxaparin Code: Full Family Update: Son at bedside  Dispo: Plan for dispo in 0-1 days pending further renal imaging.  VSanjuana LettersDO Internal Medicine Resident PGY-1 Pager [phone] contact the on call pager after 5 pm and on weekends at [phone]

## 2020-05-17 NOTE — Discharge Summary (Addendum)
Name: Jennifer [name] MRN: [id number] DOB: Apr 07, 1971 50 y.o. PCP: Jennifer Persia, MD  Date of Admission: 05/15/2020  6:19 PM Date of Discharge:  05/17/20 Attending Physician: Dr. Angelia [name]  Discharge Diagnosis: Active Problems:   Iron deficiency anemia   Pyelonephritis   Hyperglycemia due to diabetes mellitus ([place])   UTI (urinary tract infection)    Discharge Medications: Allergies as of 05/17/2020   No Known Allergies      Medication List     STOP taking these medications    acetaminophen 500 MG tablet Commonly known as: TYLENOL   cephALEXin 500 MG capsule Commonly known as: KEFLEX   insulin NPH Human 100 UNIT/ML injection Commonly known as: NOVOLIN N   Insulin Pen Needle 31G X 5 MM Misc   metFORMIN 1000 MG tablet Commonly known as: Glucophage Replaced by: metFORMIN 500 MG 24 hr tablet       TAKE these medications    Accu-Chek FastClix Lancets Misc Check blood sugar 4 times a day   Accu-Chek Guide test strip Generic drug: glucose blood Check blood sugar 4 times per day   Accu-Chek Guide w/Device Kit 1 each by Does not apply route 4 (four) times daily.   amLODipine 10 MG tablet Commonly known as: NORVASC TAKE 1 TABLET BY MOUTH EVERY DAY   apixaban 5 MG Tabs tablet Commonly known as: ELIQUIS Take 1 tablet (5 mg total) by mouth 2 (two) times daily.   atorvastatin 40 MG tablet Commonly known as: Lipitor Take 1 tablet (40 mg total) by mouth daily.   brimonidine 0.2 % ophthalmic solution Commonly known as: ALPHAGAN Place 1 drop into the left eye 2 (two) times daily.   cefdinir 300 MG capsule Commonly known as: OMNICEF Take 1 capsule (300 mg total) by mouth 2 (two) times daily for 5 days. Start taking on: May 18, 2020   dorzolamide-timolol 22.3-6.8 MG/ML ophthalmic solution Commonly known as: COSOPT Place 1 drop into the left eye 2 (two) times daily.   ferrous sulfate 325 (65 FE) MG tablet Take 1 tablet (325 mg total) by mouth every  Monday, Wednesday, and Friday for 14 days.   metFORMIN 500 MG 24 hr tablet Commonly known as: Glucophage XR Take 1 tablet (500 mg total) by mouth daily with breakfast. Replaces: metFORMIN 1000 MG tablet   multivitamin with minerals tablet Take 1 tablet by mouth daily.   NovoLIN 70/30 (70-30) 100 UNIT/ML injection Generic drug: insulin NPH-regular Human 30 units daily before breakfast, 15 units daily with supper   ondansetron 4 MG disintegrating tablet Commonly known as: Zofran ODT Take 1 tablet (4 mg total) by mouth every 8 (eight) hours as needed for nausea or vomiting.   OPTIVE 0.5-0.9 % ophthalmic solution Generic drug: carboxymethylcellul-glycerin Place 1 drop into both eyes 2 (two) times daily as needed for dry eyes.   oxyCODONE-acetaminophen 5-325 MG tablet Commonly known as: Percocet Take 1 tablet by mouth every 6 (six) hours as needed for severe pain.   polyethylene glycol 17 g packet Commonly known as: MiraLax Take 17 g by mouth daily.   Trulicity 1.5 JK/0.9FG Sopn Generic drug: Dulaglutide Inject 1.5 mg into the skin once a week.   zinc gluconate 50 MG tablet Take 1 tablet (50 mg total) by mouth daily.        Disposition and follow-up:   [name] was discharged from [facility] in Stable condition.  At the hospital follow up visit please address:  1.  Follow-up:  A.  Diabetes Regimen    B. Pyelonephritis/Antibiotic Adherence   C. Elevated Liver Enzymes/Alk Phos   D. Iron Deficiency Anemia   E. Medication Regimen  2.  Labs / imaging needed at time of follow-up: CBC, Liver Enzymes  3.  Pending labs/ test needing follow-up: HCV Quant, Urine Susceptibilities  4.  Medication Changes  Started: 70/30 Novolog, Metformin XR 500 mg  Stopped: Novolin, tylenol, Metformin 1000 mg  Abx - Cefdinir 500 mg BID End Date: 05/23/20  Follow-up Appointments:  Follow-up Information     Jennifer Persia, MD Follow up.   Specialty: Internal  Medicine Contact information: [address] [address] [place] Hospital Course by problem list:  Uncontrolled Type 2 Diabetes with Hyperglycemia  Patient with uncontrolled diabetes with multiple A1cs >14 with most recent of 10.8 presents with hyperglycemia at home in the 500s and in the clinic 290. She is on multiple medications, with a known Hx of nonadherence. Initial concern for DKA, however urinalysis was absent of ketones, bicarb is normal, and no anion gap. During admission patient's son notes that sugars would stay elevated despite multiple units of medium acting insulin. At discharge, patient switched to 70/30 Novolin pen. Patient notes compliance with trulicity, but has struggled with metformin in the past due to GI side effects. Changed to extended release metformin and decreased to 500 mg daily. Patient to follow up in clinic as well as meet with Jennifer [name], DM coordinator in clinic.    Pyelonephritis Patient presents with non adherence to her Keflex in the setting of UTI vs early pyelonephritis. Patient with Hx of early renal abscess on CT imaging. Her previous urine culture grew our Klebsiella pneumoniae that is S for cefazolin. Renal US with small amount of fluid around left kidney. CT imaging revealed bilateral pyelonephritis. Suspect patient is asymptomatic as keflex she has been on has kept infection at [place]. Prior to discharge she received two doses of IV Ceftriaxone. Will discharge home with 5 days of cefdinir. She will follow up in clinic for further evaluation. Susceptibilities to follow, will adjust abx as needed.  Iron Deficiency Anemia Patient with history of iron deficiency anemia. Upon initial evaluation, patient with hemoglobin of 11 that trended downward. Ferritin of 179. Iron 23, TIBC 223, Sat ratios 10. Appeared to be pancytopenic consistent with dilutional etiology in setting of patient receiving IV fluids. Plan to  continue patient on outpatient iron supplementation and give dose of feraheme prior to discharge.   Elevated Transaminases with Elevated Alk Phos Mildly elevate AST/ALT (151/106) and significantly elevated Alk Phos (364) on lab results. GGT of 88, affirming hepatic etiology. Hepatitis A/B antibodies negative. Hepatitis C pending. No RUQ tenderness appreciated. Does appear to have cholestatic pattern. Due to patient's absence of RUQ tenderness, would recommend outpatient RUQ [place]. Will have follow up liver enzymes in clinic with potential [place] if needed. No acute findings noted on CT hematuria.    Desquamating Rash Hands:  Patient presented with 5 days of desquamating rash of her bilateral hands. Did not appear to be acute. Possibly due to medications given after patient's right foot procedure. Oral mucosa clear of any lesions, low suspicion for SJS.   Medication Non-adherence Reached out to clinic pharmacist Dr. Georgina Peer for further assistance in managing patient's adherence issues. She notes she has too many medications, will work with clinic and staff to reduce med list if possible.  Discharge Subjective: No acute events overnight.    Patient evaluated this am, resting comfortably with son at bedside. She states she is feeling better today. Discussed antibiotic regimen for bilateral pyelonephritis.    Discussed diabetes regimen. Patient states she takes Trulicity once weekly on Mondays. She has taken metformin previously however had GI upset so it was stopped with improvement in symptoms.   Discharge Exam:   BP (!) 145/66 (BP Location: Right Arm)   Pulse 76   Temp 98.8 F (37.1 C) (Oral)   Resp 18   LMP 08/21/2016 (Exact Date)   SpO2 98%  Constitutional: well-appearing resting in bed comfortably, in no acute distress HENT: normocephalic atraumatic Eyes: conjunctiva non-erythematous Cardiovascular: regular rate and rhythm Pulmonary/Chest: normal work of breathing on room  air Abdominal:non-distended Neurological: alert & oriented x 3 Skin: warm and dry, peeling skin. No active full thickness sloughing appreciated. No erythema or tenderness.  Psych: normal mood  Pertinent Labs, Studies, and Procedures:  CBC Latest Ref Rng & Units 05/17/2020 05/16/2020 05/15/2020  WBC 4.0 - 10.5 K/uL 6.5 7.2 8.7  Hemoglobin 12.0 - 15.0 g/dL 7.4(L) 7.9(L) 8.7(L)  Hematocrit 36.0 - 46.0 % 24.5(L) 24.2(L) 28.7(L)  Platelets 150 - 400 K/uL 242 191 197    CMP Latest Ref Rng & Units 05/17/2020 05/16/2020 05/15/2020  Glucose 70 - 99 mg/dL 132(H) 266(H) 290(H)  BUN 6 - 20 mg/dL 26(H) 39(H) 43(H)  Creatinine 0.44 - 1.00 mg/dL 1.10(H) 1.42(H) 1.84(H)  Sodium 135 - 145 mmol/L 138 129(L) 126(L)  Potassium 3.5 - 5.1 mmol/L 3.9 4.2 4.1  Chloride 98 - 111 mmol/L 102 94(L) 90(L)  CO2 22 - 32 mmol/L '25 25 23  ' Calcium 8.9 - 10.3 mg/dL 8.5(L) 8.3(L) 8.6(L)  Total Protein 6.5 - 8.1 g/dL - - 7.3  Total Bilirubin 0.3 - 1.2 mg/dL - - 0.6  Alkaline Phos 38 - 126 U/L - - 364(H)  AST 15 - 41 U/L - - 151(H)  ALT 0 - 44 U/L - - 106(H)    US RENAL  Result Date: 05/15/2020 CLINICAL DATA:  Urinary tract infection. Recent diagnosis of pyelonephritis. Increased leukocytes and red cells on urinalysis today. EXAM: RENAL / URINARY TRACT ULTRASOUND COMPLETE COMPARISON:  04/10/2020 FINDINGS: Right Kidney: Renal measurements: 10.9 x 5.9 x 5.6 cm = volume: 190 mL. Somewhat heterogeneous appearance of the parenchymal echotexture without parenchymal thinning. No focal collection identified. No solid mass or hydronephrosis. Left Kidney: Renal measurements: 10.5 x 6.4 x 5.9 cm = volume: 207 mL. Normal parenchymal echotexture and thickness. No focal lesion or hydronephrosis. Minimal free fluid suggested around the superior pole. No loculated collections. Bladder: Appears normal for degree of bladder distention. Other: None. IMPRESSION: 1. No focal lesion or hydronephrosis of either kidney.  No abscess. 2. Minimal free  fluid suggested around the superior pole of the left kidney of doubtful significance. Electronically Signed   By: Lucienne Capers M.D.   On: 05/15/2020 22:09   CT HEMATURIA WORKUP  Result Date: 05/16/2020 CLINICAL DATA:  Microscopic hematuria. EXAM: CT ABDOMEN AND PELVIS WITHOUT AND WITH CONTRAST TECHNIQUE: Multidetector CT imaging of the abdomen and pelvis was performed following the standard protocol before and following the bolus administration of intravenous contrast. CONTRAST:  165m OMNIPAQUE IOHEXOL 300 MG/ML  SOLN COMPARISON:  04/08/2020 FINDINGS: Lower Chest: No acute findings. Hepatobiliary: No hepatic masses identified. Gallbladder is unremarkable. No evidence of biliary ductal dilatation. Pancreas:  No mass or inflammatory changes. Spleen: Within normal limits in size and appearance. Adrenals/Urinary  Tract: No adrenal masses identified. No evidence of urolithiasis or hydronephrosis. Multifocal ill-defined areas of decreased parenchymal enhancement are seen in both kidneys, right side greater than left. Mild asymmetric right perinephric stranding is also seen. These findings show no significant change compared to prior study, and are consistent bilateral pyelonephritis. No renal abscess or mass identified . No masses seen involving the collecting systems, ureters, or bladder. Stomach/Bowel: No evidence of obstruction, inflammatory process or abnormal fluid collections. Normal appendix visualized. Vascular/Lymphatic: No pathologically enlarged lymph nodes. No abdominal aortic aneurysm. Reproductive:  No mass or other significant abnormality. Other:  None. Musculoskeletal:  No suspicious bone lesions identified. IMPRESSION: Bilateral pyelonephritis, right side greater than left, without significant change since prior study. No evidence of renal abscess or hydronephrosis. Electronically Signed   By: Marlaine Hind M.D.   On: 05/16/2020 18:40     Discharge Instructions: Discharge Instructions      Call MD for:  difficulty breathing, headache or visual disturbances   Complete by: As directed    Call MD for:  extreme fatigue   Complete by: As directed    Call MD for:  persistant nausea and vomiting   Complete by: As directed    Call MD for:  redness, tenderness, or signs of infection (pain, swelling, redness, odor or green/yellow discharge around incision site)   Complete by: As directed    Call MD for:  severe uncontrolled pain   Complete by: As directed    Call MD for:  temperature >100.4   Complete by: As directed    Diet - low sodium heart healthy   Complete by: As directed    Increase activity slowly   Complete by: As directed        Signed: [name], MD 05/17/2020, 3:46 PM   Pager: [phone]

## 2020-05-17 NOTE — Plan of Care (Signed)
  Problem: Education: Goal: Knowledge of General Education information will improve Description: Including pain rating scale, medication(s)/side effects and non-pharmacologic comfort measures Outcome: Completed/Met   Problem: Health Behavior/Discharge Planning: Goal: Ability to manage health-related needs will improve Outcome: Completed/Met   Problem: Clinical Measurements: Goal: Ability to maintain clinical measurements within normal limits will improve Outcome: Completed/Met Goal: Will remain free from infection Outcome: Completed/Met Goal: Diagnostic test results will improve Outcome: Completed/Met Goal: Respiratory complications will improve Outcome: Completed/Met Goal: Cardiovascular complication will be avoided Outcome: Completed/Met   Problem: Activity: Goal: Risk for activity intolerance will decrease Outcome: Completed/Met   Problem: Nutrition: Goal: Adequate nutrition will be maintained Outcome: Completed/Met   Problem: Coping: Goal: Level of anxiety will decrease Outcome: Completed/Met   Problem: Elimination: Goal: Will not experience complications related to bowel motility Outcome: Completed/Met Goal: Will not experience complications related to urinary retention Outcome: Completed/Met   Problem: Pain Managment: Goal: General experience of comfort will improve Outcome: Completed/Met   Problem: Safety: Goal: Ability to remain free from injury will improve Outcome: Completed/Met   Problem: Skin Integrity: Goal: Risk for impaired skin integrity will decrease Outcome: Completed/Met   Problem: Education: Goal: Individualized Educational Video(s) Outcome: Completed/Met   Problem: Coping: Goal: Ability to adjust to condition or change in health will improve Outcome: Completed/Met   Problem: Fluid Volume: Goal: Ability to maintain a balanced intake and output will improve Outcome: Completed/Met   Problem: Health Behavior/Discharge Planning: Goal:  Ability to identify and utilize available resources and services will improve Outcome: Completed/Met Goal: Ability to manage health-related needs will improve Outcome: Completed/Met   Problem: Metabolic: Goal: Ability to maintain appropriate glucose levels will improve Outcome: Completed/Met   Problem: Nutritional: Goal: Maintenance of adequate nutrition will improve Outcome: Completed/Met Goal: Progress toward achieving an optimal weight will improve Outcome: Completed/Met   Problem: Skin Integrity: Goal: Risk for impaired skin integrity will decrease Outcome: Completed/Met   Problem: Tissue Perfusion: Goal: Adequacy of tissue perfusion will improve Outcome: Completed/Met

## 2020-05-17 NOTE — Discharge Instructions (Addendum)
Ms. Zamor, thank you for allowing [place] to care you today. You were admitted to further evaluate you for a urinary tract infection and possible kidney infection. After performing imaging studies (ultrasound and CT) it appears that you have pyelonephritis (kidney infection). We gave you two doses of antibiotics during your admission and will continue you on the medications for 5 days.   We also are writing you for new insulin pen to use. Please stop taking your old insulin. Take 30 units before breakfast and 15 units with supper. We have also prescribed you a lower dose of metformin.   Your hemoglobin was also found to be low, we will give you a dose of iron supplementation while you are admitted. Please continue to take your iron pills.   Lastly, you were found to have elevated liver enzymes test. We would like to have these repeated in the clinic next week during your follow up appointment. Please stop taking tylenol if you are continuing to take the percocet.   I have reached out to clinic to schedule you an appointment and will also reach out to our diabetes coordinator [name] and Dr. Georgina Peer

## 2020-05-17 NOTE — Hospital Course (Signed)
Abx 5 days Metformin 70/30  Gb  Covid  Follow up - clinic, kelly, donna

## 2020-05-17 NOTE — Progress Notes (Signed)
Discharge instructions reviewed with patient and with patient's son.  These included, but were not limited to, the following:  New medications, follow-up appointments, importance of diabetes regimen and steps necessary to improve over-all health, 9-1-1 and emergency numbers, antibiotic treatment and importance of following through with entire treatment regimen, etc.  Questions were answered to patient's satisfaction and comprehension was ascertained utilizing "teach-back" technique.  Patient discharged via wheelchair accompanied by son and escorted to exit by nurse tech.

## 2020-05-18 ENCOUNTER — Other Ambulatory Visit: Payer: Self-pay | Admitting: Student

## 2020-05-18 LAB — URINE CULTURE: Culture: 50000 — AB

## 2020-05-18 MED ORDER — AMOXICILLIN 500 MG PO CAPS: 500.0000 mg | ORAL_CAPSULE | Freq: Three times a day (TID) | ORAL | 0 refills | Status: AC

## 2020-05-18 NOTE — Progress Notes (Signed)
Culture 50,000 COLONIES/mL ENTEROCOCCUS FAECALISAbnormal   Report Status 05/18/2020 FINAL   Organism ID, Bacteria ENTEROCOCCUS FAECALISAbnormal     Urine cultures resulted as E. Faecalis. Changed Abx from cefdinir to ampicillin 500 mg TID for 7 days. Called patient and updated her. Daughter acted as Optometrist.

## 2020-05-24 ENCOUNTER — Encounter: Payer: Self-pay | Admitting: Dietician

## 2020-05-24 ENCOUNTER — Ambulatory Visit: Payer: Medicaid Other | Admitting: Student

## 2020-05-25 ENCOUNTER — Ambulatory Visit: Payer: Medicaid Other | Admitting: Registered"

## 2020-05-26 ENCOUNTER — Encounter: Payer: Medicaid Other | Admitting: Internal Medicine

## 2020-05-28 ENCOUNTER — Other Ambulatory Visit: Payer: Self-pay | Admitting: Internal Medicine

## 2020-05-29 ENCOUNTER — Other Ambulatory Visit: Payer: Self-pay | Admitting: *Deleted

## 2020-05-29 ENCOUNTER — Ambulatory Visit (INDEPENDENT_AMBULATORY_CARE_PROVIDER_SITE_OTHER): Payer: Medicaid Other | Admitting: Internal Medicine

## 2020-05-29 ENCOUNTER — Other Ambulatory Visit: Payer: Self-pay

## 2020-05-29 ENCOUNTER — Encounter: Payer: Self-pay | Admitting: Internal Medicine

## 2020-05-29 VITALS — BP 122/47 | HR 84 | Temp 97.8°F | Ht 67.0 in | Wt 180.0 lb

## 2020-05-29 DIAGNOSIS — I152 Hypertension secondary to endocrine disorders: Secondary | ICD-10-CM

## 2020-05-29 DIAGNOSIS — E1159 Type 2 diabetes mellitus with other circulatory complications: Secondary | ICD-10-CM | POA: Diagnosis not present

## 2020-05-29 DIAGNOSIS — N12 Tubulo-interstitial nephritis, not specified as acute or chronic: Secondary | ICD-10-CM | POA: Diagnosis not present

## 2020-05-29 DIAGNOSIS — E861 Hypovolemia: Secondary | ICD-10-CM | POA: Diagnosis not present

## 2020-05-29 DIAGNOSIS — R748 Abnormal levels of other serum enzymes: Secondary | ICD-10-CM | POA: Diagnosis not present

## 2020-05-29 DIAGNOSIS — N179 Acute kidney failure, unspecified: Secondary | ICD-10-CM

## 2020-05-29 DIAGNOSIS — I9589 Other hypotension: Secondary | ICD-10-CM

## 2020-05-29 DIAGNOSIS — I959 Hypotension, unspecified: Secondary | ICD-10-CM

## 2020-05-29 DIAGNOSIS — D509 Iron deficiency anemia, unspecified: Secondary | ICD-10-CM

## 2020-05-29 LAB — GLUCOSE, CAPILLARY: Glucose-Capillary: 224 mg/dL — ABNORMAL HIGH (ref 70–99)

## 2020-05-29 LAB — CBC
HCT: 35 % — ABNORMAL LOW (ref 36.0–46.0)
Hemoglobin: 10.4 g/dL — ABNORMAL LOW (ref 12.0–15.0)
MCH: 25.1 pg — ABNORMAL LOW (ref 26.0–34.0)
MCHC: 29.7 g/dL — ABNORMAL LOW (ref 30.0–36.0)
MCV: 84.5 fL (ref 80.0–100.0)
Platelets: 130 10*3/uL — ABNORMAL LOW (ref 150–400)
RBC: 4.14 MIL/uL (ref 3.87–5.11)
RDW: 15.8 % — ABNORMAL HIGH (ref 11.5–15.5)
WBC: 4.6 10*3/uL (ref 4.0–10.5)
nRBC: 0.4 % — ABNORMAL HIGH (ref 0.0–0.2)

## 2020-05-29 LAB — BASIC METABOLIC PANEL
Anion gap: 11 (ref 5–15)
BUN: 34 mg/dL — ABNORMAL HIGH (ref 6–20)
CO2: 25 mmol/L (ref 22–32)
Calcium: 9.7 mg/dL (ref 8.9–10.3)
Chloride: 99 mmol/L (ref 98–111)
Creatinine, Ser: 1.28 mg/dL — ABNORMAL HIGH (ref 0.44–1.00)
GFR, Estimated: 51 mL/min — ABNORMAL LOW (ref >60)
Glucose, Bld: 253 mg/dL — ABNORMAL HIGH (ref 70–99)
Potassium: 4.8 mmol/L (ref 3.5–5.1)
Sodium: 135 mmol/L (ref 135–145)

## 2020-05-29 MED ORDER — SODIUM CHLORIDE 0.9 % IV BOLUS
1000.0000 mL | Freq: Once | INTRAVENOUS | Status: AC
  Administered 2020-05-29: 1000 mL via INTRAVENOUS

## 2020-05-29 NOTE — Progress Notes (Signed)
   CC: Hospital follow up  HPI:  Jennifer [name] is a 50 y.o. with past medical history listed below who presented to clinic today for hospital follow-up. Patient was hospitalized from 1/24-1/26/2022 for pyelonephritis, hyperglycemia. She came in today for post hospital follow up. Per patient and family preference, her son was contacted and assists with translation. Please refer to problem based charting for further details and assessment and plan.  Past medical history: HFpEF, atrial fibrillation, HTN, DM, vitamin D deficiency, IDA Medications: Amlodipine, Eliquis, Lipitor, Trulicity, NovoLog, Metformin  Past Medical History:  Diagnosis Date  . Anemia, iron deficiency   . Atrial fibrillation ([place])   . Blindness of left eye   . Decreased visual acuity    Left eye  . Depression   . 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 Hair loss   . History of fracture of clavicle 05/18/2015  . Hyperlipidemia   . Hypertension   . Iron deficiency anemia 05/13/2013  . Pain, dental 08/19/2018   Tooth pain/facial swelling: has poor dentition at baseline, history of dental abscess.  She has not seen a dentist in about one year.  Dentist is on [address].  No fevers chills or systemic symptoms.  Diabetes has been poorly controlled for some time.  She said it has improved recently averaging around 140.  Called the dentist said they would not see patients until May 14th but the urgency o  . Pap smear abnormality of cervix with LGSIL   . Routine/ritual circumcision   . Type II diabetes mellitus ([place])   . Uveitis    Review of Systems:   Constitutional: Negative for chills and fever.  GU: Negative for dysuria, frequency and urgency. Negative for flank pain. Gastrointestinal: Negative for abdominal pain, nausea and vomiting.  Neurological: + for dizziness  Physical  Exam:  Vitals:   05/29/20 1626 05/29/20 1735 05/29/20 1736 05/29/20 1738  BP: (!) 87/58 120/70 102/61 111/67  Pulse: 81 78 76 74  Temp: 97.8 F (36.6 C)     TempSrc: Oral     SpO2: 93%     Weight: 180 lb (81.6 kg)     Height: 5\' 7"  (1.702 m)      Constitutional: uncomfortable and weak appearing Cardiovascular: RRR, nl S1S2, no murmur, no LEE Respiratory: Effort normal and breath sounds normal. No respiratory distress. No wheezes.  GI: Soft. No distension. There is no tenderness.  Neurological: Tired but alert, oriented x 3  Assessment & Plan:    See Encounters Tab for problem based charting.  Patient discussed with Dr. Heber Cliff

## 2020-05-29 NOTE — Assessment & Plan Note (Addendum)
She had elevated liver enzyme and alkaline phosphatase as well as AKI. Hep B negative. Not detected. HCV quantitative.   We will repeat her LFT today.  -CMP -We will consider right upper quadrant ultrasound if still has elevated LFTs

## 2020-05-29 NOTE — Assessment & Plan Note (Addendum)
Recent blood work in hospital: patient with hemoglobin of 11 that trended downward. Ferritin of 179. Iron 23, TIBC 223, Sat ratios 10. Patient reports some GI issue with ferrous sulfate and some of other medications. She would like to discuss all of her medications next visit.  -CBC  ADDNEDUM: Hb stable at 10.4  CBC Latest Ref Rng & Units 05/29/2020 05/17/2020 05/16/2020  WBC 4.0 - 10.5 K/uL 4.6 6.5 7.2  Hemoglobin 12.0 - 15.0 g/dL 10.4(L) 7.4(L) 7.9(L)  Hematocrit 36.0 - 46.0 % 35.0(L) 24.5(L) 24.2(L)  Platelets 150 - 400 K/uL 130(L) 242 191

## 2020-05-29 NOTE — Patient Instructions (Addendum)
Thank you for allowing [place] to provide your care today. Today you came in for hospital follow up. Your blood pressure was very low when you came in. Likely because you did not eat since yesterday.  You received IV fluid and your blood pressure improved.  1- Do not take Amlodpine for next 2 days. 2- Please make sure you eat and drink regularly and so not skip a meal 3- I have ordered some labs for you. I will call if any are abnormal  4-Take rest of your medications as before 5- Follow up in clinic in 3-5 days  Please follow up in clinic in 3-5 days for blood pressure check or earlier if your symptoms get worse or not improved. Bring all of your medications with you next time. As always, if having severe symptoms, please seek medical attention at emergency room. Should you have any questions or concerns please call the internal medicine clinic at [phone].    Thank you!          Marland Kitchen     .       .       .      .  1-      . 2-            3-     .        4-       5-     3-5        3-5             .       Marland Kitchen                  Marland Kitchen               [phone].   !

## 2020-05-29 NOTE — Assessment & Plan Note (Signed)
Holding amlodipine today and for nexr 2 days due to symptomatic hypotension.

## 2020-05-29 NOTE — Assessment & Plan Note (Addendum)
Patient came in for hospital follow up today. On arrival, her BP was low at 89/78 and she was dizzy. Afebrile. Heart and lung exam is unremarkable. She states that she has not eaten anything today because she woke up late (3Pm) and came to clinic directly. This is likely the cause of her hypotension. Her appetite has been OK but she just missed the meals. No Hx of blood or volume loss. (No N/V or diarrhea.) And Her CBG was 244.   -NS 1 li (patient has HFpEF but no acute HF) -IV team consulted for IV line -Educated patient and her family about not skipping a meal and importance of keeping herself hydrated and eat regularly -Hold Amlodipine today and for next 2 days and f/u in clinic in few days  ADDENDUM: BP and dizziness improved after IV fluid. Patient feels comfortable to and clinically improved to be sent to home

## 2020-05-30 MED ORDER — BD PEN NEEDLE MINI U/F 31G X 5 MM MISC: 2 refills | Status: DC

## 2020-05-30 NOTE — Assessment & Plan Note (Signed)
finished Abx therapy after discharge. No urinary sympotoms. Afebrile.

## 2020-05-30 NOTE — Assessment & Plan Note (Signed)
Cr mildly elevated from last check. Likely prerenal, 2/2 hypotension and as patient did not eat anything since yesterday because she was sleeping throughout the day.  Her BP was low at 86/58 on arrival. She received 1 li on NS today and BP improved -s/p 1 li of NS in clinic after lab collected -Educated pt about importance of keeping herself hydrated -Return to clinic in 3-5 days for BP check and can recheck BMP then

## 2020-05-31 ENCOUNTER — Inpatient Hospital Stay: Admission: AD | Admit: 2020-05-31 | Payer: Medicaid Other | Source: Home / Self Care | Admitting: Internal Medicine

## 2020-05-31 ENCOUNTER — Encounter: Payer: Self-pay | Admitting: Internal Medicine

## 2020-05-31 ENCOUNTER — Ambulatory Visit (INDEPENDENT_AMBULATORY_CARE_PROVIDER_SITE_OTHER): Payer: Medicaid Other | Admitting: Internal Medicine

## 2020-05-31 VITALS — BP 102/56 | HR 75 | Temp 97.8°F | Wt 180.7 lb

## 2020-05-31 DIAGNOSIS — N179 Acute kidney failure, unspecified: Secondary | ICD-10-CM

## 2020-05-31 DIAGNOSIS — I951 Orthostatic hypotension: Secondary | ICD-10-CM

## 2020-05-31 DIAGNOSIS — R748 Abnormal levels of other serum enzymes: Secondary | ICD-10-CM

## 2020-05-31 DIAGNOSIS — R6881 Early satiety: Secondary | ICD-10-CM

## 2020-05-31 LAB — HEPATIC FUNCTION PANEL
ALT: 14 U/L (ref 0–44)
AST: 15 U/L (ref 15–41)
Albumin: 3 g/dL — ABNORMAL LOW (ref 3.5–5.0)
Alkaline Phosphatase: 121 U/L (ref 38–126)
Bilirubin, Direct: <0.1 mg/dL (ref 0.0–0.2)
Total Bilirubin: 0.6 mg/dL (ref 0.3–1.2)
Total Protein: 7.3 g/dL (ref 6.5–8.1)

## 2020-05-31 LAB — BASIC METABOLIC PANEL
Anion gap: 11 (ref 5–15)
BUN: 32 mg/dL — ABNORMAL HIGH (ref 6–20)
CO2: 24 mmol/L (ref 22–32)
Calcium: 9.5 mg/dL (ref 8.9–10.3)
Chloride: 102 mmol/L (ref 98–111)
Creatinine, Ser: 0.99 mg/dL (ref 0.44–1.00)
GFR, Estimated: >60 mL/min (ref >60)
Glucose, Bld: 183 mg/dL — ABNORMAL HIGH (ref 70–99)
Potassium: 4.5 mmol/L (ref 3.5–5.1)
Sodium: 137 mmol/L (ref 135–145)

## 2020-05-31 LAB — GLUCOSE, CAPILLARY: Glucose-Capillary: 179 mg/dL — ABNORMAL HIGH (ref 70–99)

## 2020-05-31 MED ORDER — SODIUM CHLORIDE 0.9 % IV BOLUS
1000.0000 mL | Freq: Once | INTRAVENOUS | Status: AC
Start: 2020-05-31 — End: 2020-05-31
  Administered 2020-05-31: 1000 mL via INTRAVENOUS

## 2020-05-31 NOTE — Progress Notes (Signed)
Internal Medicine Clinic Attending  Case discussed with Dr. Masoudi  At the time of the visit.  We reviewed the resident's history and exam and pertinent patient test results.  I agree with the assessment, diagnosis, and plan of care documented in the resident's note.  

## 2020-05-31 NOTE — Assessment & Plan Note (Signed)
[name] states that for at least 4 to 5 days now, she has been experiencing early satiety and has only been able to eat small amounts with each meal.  She denies any nausea or vomiting.  She denies any abdominal pain.  No difficulty with bowel movements.  Assessment/plan: Given longstanding history of uncontrolled diabetes, high suspicion for gastroparesis.  However she is on Trulicity that may be contributing.  We will start with discontinue Trulicity and will consider Reglan in the future if this is inadequate to improve appetite.  -Discontinue Trulicity -Consider Reglan in the future

## 2020-05-31 NOTE — Assessment & Plan Note (Signed)
BMP repeated today and shows resolution. No further intervention indicated.   - Encourage PO intake

## 2020-05-31 NOTE — Assessment & Plan Note (Signed)
Patient has a past medical history of elevated enzymes, as well as alkaline phosphatase.  Work-up has been negative for any viral hepatitis.  She has not had a right upper quadrant ultrasound as of yet.  We will recheck liver enzymes today and if continues to be elevated consider ordering right upper quadrant ultrasound.  - Liver function panel pending

## 2020-05-31 NOTE — Progress Notes (Signed)
CC: Hypotension follow up   HPI:  [name] is a 50 y.o. with a PMHx as listed below who presents to the clinic for hypotension follow up.   Please see the Encounters tab for problem-based Assessment & Plan regarding status of patient's acute and chronic conditions.  Past Medical History:  Diagnosis Date  . Anemia, iron deficiency   . Atrial fibrillation ([place])   . Blindness of left eye   . Decreased visual acuity    Left eye  . Depression   . 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 Hair loss   . History of fracture of clavicle 05/18/2015  . Hyperlipidemia   . Hypertension   . Iron deficiency anemia 05/13/2013  . Pain, dental 08/19/2018   Tooth pain/facial swelling: has poor dentition at baseline, history of dental abscess.  She has not seen a dentist in about one year.  Dentist is on [address].  No fevers chills or systemic symptoms.  Diabetes has been poorly controlled for some time.  She said it has improved recently averaging around 140.  Called the dentist said they would not see patients until May 14th but the urgency o  . Pap smear abnormality of cervix with LGSIL   . Routine/ritual circumcision   . Type II diabetes mellitus ([place])   . Uveitis    Review of Systems: Review of Systems  Constitutional: Positive for malaise/fatigue. Negative for chills, fever and weight loss.  Respiratory: Negative for cough and shortness of breath.   Cardiovascular: Negative for chest pain and leg swelling.  Gastrointestinal: Negative for abdominal pain, constipation, diarrhea, nausea and vomiting.  Genitourinary: Negative for dysuria and urgency.  Skin: Negative for itching and rash.  Neurological: Positive for dizziness and headaches. Negative for focal weakness.   Physical Exam:  Vitals:   05/31/20 1044  BP: (!) 102/56  Pulse: 75  Temp:  97.8 F (36.6 C)  TempSrc: Oral  SpO2: 100%  Weight: 180 lb 11.2 oz (82 kg)    Physical Exam Vitals and nursing note reviewed.  Constitutional:      General: She is not in acute distress.    Appearance: She is obese.  HENT:     Head: Normocephalic and atraumatic.  Cardiovascular:     Rate and Rhythm: Normal rate and regular rhythm.     Heart sounds: No murmur heard. No gallop.   Pulmonary:     Effort: Pulmonary effort is normal. No respiratory distress.     Breath sounds: No wheezing or rales.  Abdominal:     General: Bowel sounds are normal. There is no distension.     Palpations: Abdomen is soft.     Tenderness: There is no abdominal tenderness. There is no guarding.  Musculoskeletal:     Right lower leg: No edema.     Left lower leg: No edema.  Skin:    General: Skin is warm and dry.     Comments: Minimal skin tenting  Neurological:     General: No focal deficit present.     Mental Status: She is alert and oriented to person, place, and time.  Psychiatric:        Mood and Affect: Mood normal.        Behavior: Behavior normal.     Assessment & Plan:   See Encounters Tab for problem based charting.  Patient  discussed with Dr. Heber Nilwood

## 2020-05-31 NOTE — Assessment & Plan Note (Signed)
Mrs. Motter is here today for follow up of hypotension that was noted on her prior visit 2 days ago. Today, she endorses continued dizziness and headache. Dizziness becomes worse with standing or sitting up. She continues to not eat or drink much due to lack of appetite. She denies any other acute symptoms, including focal weakness, chest pain, SOB, cough, fever chills. Family and patient confirmed she stopped Amlodipine over the past 2 days.   Assessment/Plan:   Initial orthostatics positive:  Lying: 127/68, HR 75 Sitting: 107/59, HR 72 Standing: 79/53, HR 76  Positive orthostatics with no change in heart rate concerning for autonomic dysfunction secondary to severe long-standing uncontrolled diabetes. There may also be an aspect of dehydration in light of poor PO intake, however given AKI resolved since last visit 2 days ago, likely minimally contributing. Will go ahead with 1 L NS bolus for symptomatic relief.   Repeat orthostatics show significant improvement post-bolus.  - S/p 1 L NS  - Discontinue all anti-hypertensives - Discussed with family the importance of consistent PO intake - Consider Midodrine in the future if continues to be symptomatic, although she is at risk of hypertension

## 2020-05-31 NOTE — Patient Instructions (Addendum)
It was nice seeing you today! Thank you for choosing [facility] for your Primary Care.    Today we talked about:   1. Low Blood Pressure:  a. This is likely a combination of nerve damage called autonomic dysfunction from uncontrolled diabetes, as well as mild dehydration.  b. Make sure Jennifer [name] is not taking Amlodipine any longer.  c. Make sure she is drinking at least 5-6 glasses of water per day.    2. Lack of Appetite:  a. This is either a side effect from her medication called Trulicity or from nerve damage to the stomach from diabetes (or both) b. STOP taking Trulicity. This is the once per week medication via injection.  c. Continue with the insulin and Metformin only for now d. If she can only eat small meals, she will need to eat more often.  Follow up in 1 week.  -------------------------------------------------------------------     !   [facility]  .     :    :                         .         .         5-6    .    :        Trulicity          ( )    Trulicity.         .                    .    1 .

## 2020-06-02 NOTE — Progress Notes (Signed)
Internal Medicine Clinic Attending  Case discussed with Dr. Charleen Kirks  At the time of the visit.  We reviewed the resident's history and exam and pertinent patient test results.  I agree with the assessment, diagnosis, and plan of care documented in the resident's note. As noted will have patient discontinue trulicity and reassess GI symptoms. LFTs have improved.

## 2020-06-08 ENCOUNTER — Encounter: Payer: Medicaid Other | Admitting: Internal Medicine

## 2020-06-13 ENCOUNTER — Ambulatory Visit: Payer: Medicaid Other | Admitting: Gastroenterology

## 2020-06-13 NOTE — Addendum Note (Signed)
Addended by: Hulan Fray on: 06/13/2020 07:54 PM   Modules accepted: Orders

## 2020-06-15 ENCOUNTER — Ambulatory Visit: Payer: Medicaid Other | Admitting: Internal Medicine

## 2020-06-15 ENCOUNTER — Other Ambulatory Visit: Payer: Self-pay

## 2020-06-15 ENCOUNTER — Encounter: Payer: Medicaid Other | Admitting: Student

## 2020-06-15 ENCOUNTER — Inpatient Hospital Stay (HOSPITAL_COMMUNITY)
Admission: EM | Admit: 2020-06-15 | Discharge: 2020-06-18 | DRG: 177 | Disposition: A | Discharge status: Home / Self Care | Payer: Medicaid Other | Attending: Internal Medicine | Admitting: Internal Medicine

## 2020-06-15 ENCOUNTER — Encounter (HOSPITAL_COMMUNITY): Payer: Self-pay | Admitting: Emergency Medicine

## 2020-06-15 DIAGNOSIS — Z794 Long term (current) use of insulin: Secondary | ICD-10-CM | POA: Diagnosis not present

## 2020-06-15 DIAGNOSIS — I152 Hypertension secondary to endocrine disorders: Secondary | ICD-10-CM | POA: Diagnosis present

## 2020-06-15 DIAGNOSIS — R300 Dysuria: Secondary | ICD-10-CM | POA: Diagnosis present

## 2020-06-15 DIAGNOSIS — Z1611 Resistance to penicillins: Secondary | ICD-10-CM | POA: Diagnosis present

## 2020-06-15 DIAGNOSIS — E1169 Type 2 diabetes mellitus with other specified complication: Secondary | ICD-10-CM | POA: Diagnosis present

## 2020-06-15 DIAGNOSIS — R8281 Pyuria: Secondary | ICD-10-CM | POA: Diagnosis present

## 2020-06-15 DIAGNOSIS — E860 Dehydration: Secondary | ICD-10-CM | POA: Diagnosis present

## 2020-06-15 DIAGNOSIS — N179 Acute kidney failure, unspecified: Secondary | ICD-10-CM | POA: Diagnosis present

## 2020-06-15 DIAGNOSIS — E11319 Type 2 diabetes mellitus with unspecified diabetic retinopathy without macular edema: Secondary | ICD-10-CM

## 2020-06-15 DIAGNOSIS — E875 Hyperkalemia: Secondary | ICD-10-CM | POA: Diagnosis present

## 2020-06-15 DIAGNOSIS — I4891 Unspecified atrial fibrillation: Secondary | ICD-10-CM | POA: Diagnosis present

## 2020-06-15 DIAGNOSIS — K5900 Constipation, unspecified: Secondary | ICD-10-CM | POA: Diagnosis present

## 2020-06-15 DIAGNOSIS — Z833 Family history of diabetes mellitus: Secondary | ICD-10-CM | POA: Diagnosis not present

## 2020-06-15 DIAGNOSIS — E871 Hypo-osmolality and hyponatremia: Secondary | ICD-10-CM | POA: Diagnosis present

## 2020-06-15 DIAGNOSIS — I5032 Chronic diastolic (congestive) heart failure: Secondary | ICD-10-CM | POA: Diagnosis present

## 2020-06-15 DIAGNOSIS — E1159 Type 2 diabetes mellitus with other circulatory complications: Secondary | ICD-10-CM | POA: Diagnosis present

## 2020-06-15 DIAGNOSIS — E1100 Type 2 diabetes mellitus with hyperosmolarity without nonketotic hyperglycemic-hyperosmolar coma (NKHHC): Secondary | ICD-10-CM | POA: Diagnosis present

## 2020-06-15 DIAGNOSIS — H409 Unspecified glaucoma: Secondary | ICD-10-CM | POA: Diagnosis present

## 2020-06-15 DIAGNOSIS — F32A Depression, unspecified: Secondary | ICD-10-CM | POA: Diagnosis present

## 2020-06-15 DIAGNOSIS — Z8249 Family history of ischemic heart disease and other diseases of the circulatory system: Secondary | ICD-10-CM

## 2020-06-15 DIAGNOSIS — E1165 Type 2 diabetes mellitus with hyperglycemia: Secondary | ICD-10-CM | POA: Diagnosis not present

## 2020-06-15 DIAGNOSIS — Z9181 History of falling: Secondary | ICD-10-CM

## 2020-06-15 DIAGNOSIS — M79672 Pain in left foot: Secondary | ICD-10-CM | POA: Diagnosis present

## 2020-06-15 DIAGNOSIS — U071 COVID-19: Secondary | ICD-10-CM | POA: Diagnosis present

## 2020-06-15 DIAGNOSIS — E872 Acidosis: Secondary | ICD-10-CM | POA: Diagnosis present

## 2020-06-15 DIAGNOSIS — R110 Nausea: Secondary | ICD-10-CM

## 2020-06-15 DIAGNOSIS — H5462 Unqualified visual loss, left eye, normal vision right eye: Secondary | ICD-10-CM | POA: Diagnosis present

## 2020-06-15 DIAGNOSIS — E785 Hyperlipidemia, unspecified: Secondary | ICD-10-CM | POA: Diagnosis present

## 2020-06-15 DIAGNOSIS — R739 Hyperglycemia, unspecified: Secondary | ICD-10-CM

## 2020-06-15 DIAGNOSIS — I480 Paroxysmal atrial fibrillation: Secondary | ICD-10-CM | POA: Diagnosis present

## 2020-06-15 DIAGNOSIS — E119 Type 2 diabetes mellitus without complications: Secondary | ICD-10-CM

## 2020-06-15 HISTORY — DX: COVID-19: U07.1

## 2020-06-15 LAB — URINALYSIS, ROUTINE W REFLEX MICROSCOPIC
Bilirubin Urine: NEGATIVE
Glucose, UA: >=500 mg/dL — AB
Ketones, ur: NEGATIVE mg/dL
Nitrite: NEGATIVE
Protein, ur: 100 mg/dL — AB
Specific Gravity, Urine: 1.015 (ref 1.005–1.030)
WBC, UA: >50 WBC/hpf — ABNORMAL HIGH (ref 0–5)
pH: 5 (ref 5.0–8.0)

## 2020-06-15 LAB — I-STAT VENOUS BLOOD GAS, ED
Acid-Base Excess: 0 mmol/L (ref 0.0–2.0)
Bicarbonate: 23.4 mmol/L (ref 20.0–28.0)
Calcium, Ion: 1.02 mmol/L — ABNORMAL LOW (ref 1.15–1.40)
HCT: 41 % (ref 36.0–46.0)
Hemoglobin: 13.9 g/dL (ref 12.0–15.0)
O2 Saturation: 74 %
Potassium: 5.5 mmol/L — ABNORMAL HIGH (ref 3.5–5.1)
Sodium: 129 mmol/L — ABNORMAL LOW (ref 135–145)
TCO2: 24 mmol/L (ref 22–32)
pCO2, Ven: 34.7 mmHg — ABNORMAL LOW (ref 44.0–60.0)
pH, Ven: 7.436 — ABNORMAL HIGH (ref 7.250–7.430)
pO2, Ven: 38 mmHg (ref 32.0–45.0)

## 2020-06-15 LAB — COMPREHENSIVE METABOLIC PANEL
ALT: 16 U/L (ref 0–44)
AST: 25 U/L (ref 15–41)
Albumin: 3.3 g/dL — ABNORMAL LOW (ref 3.5–5.0)
Alkaline Phosphatase: 121 U/L (ref 38–126)
Anion gap: 12 (ref 5–15)
BUN: 28 mg/dL — ABNORMAL HIGH (ref 6–20)
CO2: 22 mmol/L (ref 22–32)
Calcium: 9.2 mg/dL (ref 8.9–10.3)
Chloride: 93 mmol/L — ABNORMAL LOW (ref 98–111)
Creatinine, Ser: 1.62 mg/dL — ABNORMAL HIGH (ref 0.44–1.00)
GFR, Estimated: 39 mL/min — ABNORMAL LOW (ref >60)
Glucose, Bld: 624 mg/dL (ref 70–99)
Potassium: 5.1 mmol/L (ref 3.5–5.1)
Sodium: 127 mmol/L — ABNORMAL LOW (ref 135–145)
Total Bilirubin: 0.7 mg/dL (ref 0.3–1.2)
Total Protein: 8.1 g/dL (ref 6.5–8.1)

## 2020-06-15 LAB — CBC
HCT: 38.7 % (ref 36.0–46.0)
Hemoglobin: 12.3 g/dL (ref 12.0–15.0)
MCH: 26.3 pg (ref 26.0–34.0)
MCHC: 31.8 g/dL (ref 30.0–36.0)
MCV: 82.7 fL (ref 80.0–100.0)
Platelets: 174 10*3/uL (ref 150–400)
RBC: 4.68 MIL/uL (ref 3.87–5.11)
RDW: 14.9 % (ref 11.5–15.5)
WBC: 5.5 10*3/uL (ref 4.0–10.5)
nRBC: 0 % (ref 0.0–0.2)

## 2020-06-15 LAB — RESP PANEL BY RT-PCR (FLU A&B, COVID) ARPGX2
Influenza A by PCR: NEGATIVE
Influenza B by PCR: NEGATIVE
SARS Coronavirus 2 by RT PCR: POSITIVE — AB

## 2020-06-15 LAB — BASIC METABOLIC PANEL
Anion gap: 9 (ref 5–15)
BUN: 26 mg/dL — ABNORMAL HIGH (ref 6–20)
CO2: 24 mmol/L (ref 22–32)
Calcium: 9.1 mg/dL (ref 8.9–10.3)
Chloride: 98 mmol/L (ref 98–111)
Creatinine, Ser: 1.27 mg/dL — ABNORMAL HIGH (ref 0.44–1.00)
GFR, Estimated: 52 mL/min — ABNORMAL LOW (ref >60)
Glucose, Bld: 375 mg/dL — ABNORMAL HIGH (ref 70–99)
Potassium: 4.6 mmol/L (ref 3.5–5.1)
Sodium: 131 mmol/L — ABNORMAL LOW (ref 135–145)

## 2020-06-15 LAB — CBG MONITORING, ED
Glucose-Capillary: 121 mg/dL — ABNORMAL HIGH (ref 70–99)
Glucose-Capillary: 353 mg/dL — ABNORMAL HIGH (ref 70–99)
Glucose-Capillary: 517 mg/dL (ref 70–99)

## 2020-06-15 LAB — OSMOLALITY: Osmolality: 305 mOsm/kg — ABNORMAL HIGH (ref 275–295)

## 2020-06-15 LAB — BETA-HYDROXYBUTYRIC ACID: Beta-Hydroxybutyric Acid: 0.13 mmol/L (ref 0.05–0.27)

## 2020-06-15 LAB — LIPASE, BLOOD: Lipase: 29 U/L (ref 11–51)

## 2020-06-15 MED ORDER — LACTATED RINGERS IV SOLN: INTRAVENOUS | Status: DC

## 2020-06-15 MED ORDER — HEPARIN SODIUM (PORCINE) 5000 UNIT/ML IJ SOLN
5000.0000 [IU] | Freq: Three times a day (TID) | INTRAMUSCULAR | Status: DC
  Administered 2020-06-16: 5000 [IU] via SUBCUTANEOUS
  Filled 2020-06-15: qty 1

## 2020-06-15 MED ORDER — POTASSIUM CHLORIDE 10 MEQ/100ML IV SOLN: 10.0000 meq | INTRAVENOUS | Status: DC

## 2020-06-15 MED ORDER — POLYETHYLENE GLYCOL 3350 17 G PO PACK
17.0000 g | PACK | Freq: Every day | ORAL | Status: DC
  Administered 2020-06-16 – 2020-06-18 (×3): 17 g via ORAL
  Filled 2020-06-15 (×3): qty 1

## 2020-06-15 MED ORDER — LACTATED RINGERS IV BOLUS
1000.0000 mL | Freq: Once | INTRAVENOUS | Status: AC
  Administered 2020-06-15: 1000 mL via INTRAVENOUS

## 2020-06-15 MED ORDER — GUAIFENESIN-DM 100-10 MG/5ML PO SYRP
10.0000 mL | ORAL_SOLUTION | ORAL | Status: DC | PRN
  Administered 2020-06-17 – 2020-06-18 (×3): 10 mL via ORAL
  Filled 2020-06-15 (×3): qty 10

## 2020-06-15 MED ORDER — INSULIN ASPART 100 UNIT/ML ~~LOC~~ SOLN
10.0000 [IU] | Freq: Once | SUBCUTANEOUS | Status: AC
  Administered 2020-06-15: 10 [IU] via SUBCUTANEOUS

## 2020-06-15 MED ORDER — DEXTROSE IN LACTATED RINGERS 5 % IV SOLN: INTRAVENOUS | Status: DC

## 2020-06-15 MED ORDER — DEXTROSE 50 % IV SOLN: 0.0000 mL | INTRAVENOUS | Status: DC | PRN

## 2020-06-15 MED ORDER — ACETAMINOPHEN 650 MG RE SUPP: 650.0000 mg | Freq: Four times a day (QID) | RECTAL | Status: DC | PRN

## 2020-06-15 MED ORDER — INSULIN REGULAR(HUMAN) IN NACL 100-0.9 UT/100ML-% IV SOLN
INTRAVENOUS | Status: DC
  Administered 2020-06-15: 12 [IU]/h via INTRAVENOUS
  Administered 2020-06-16: 0.7 [IU]/h via INTRAVENOUS
  Filled 2020-06-15: qty 100

## 2020-06-15 MED ORDER — ONDANSETRON HCL 4 MG/2ML IJ SOLN
4.0000 mg | Freq: Once | INTRAMUSCULAR | Status: AC
  Administered 2020-06-15: 4 mg via INTRAVENOUS
  Filled 2020-06-15: qty 2

## 2020-06-15 MED ORDER — LACTATED RINGERS IV BOLUS
20.0000 mL/kg | Freq: Once | INTRAVENOUS | Status: AC
  Administered 2020-06-15: 1640 mL via INTRAVENOUS

## 2020-06-15 MED ORDER — ACETAMINOPHEN 325 MG PO TABS
650.0000 mg | ORAL_TABLET | Freq: Four times a day (QID) | ORAL | Status: DC | PRN
  Administered 2020-06-16: 650 mg via ORAL
  Filled 2020-06-15 (×2): qty 2

## 2020-06-15 NOTE — Progress Notes (Signed)
   CC: Follow-up diabetes mellitus  HPI:  Jennifer [name] is a 50 y.o. with history of uncontrolled diabetes mellitus here for follow-up.  Please see problem based charting for further details.  Type 2 diabetes mellitus: Uncontrolled.  Last A1c 10.3%<<greater than 14%<<greater than 14%<<14%.  She also has what appears to be autonomic dysfunction that has caused persistent orthostatic hypotension as well as high suspicion for diabetic gastroparesis.  Given this, her Trulicity was stopped during her last visit on May 31, 2020.  While I was precharting and the patient, I was called by nursing staff to evaluate patient as she appeared extremely weak, dehydrated and was writhing in pain.  From the information I could gather with the son translating, she was complaining of generalized body pain.  I did a brief motor strength neuro exam on her and did not appear to have any focal neurological deficits but did appear unwell.  Given this acute change, a decision was made to take her to the emergency department to seek emergent care.

## 2020-06-15 NOTE — H&P (Incomplete)
Date: 06/15/2020               Patient Name:  Jennifer [name] MRN: [id number]  DOB: 10/05/1970 Age / Sex: 50 y.o., female   PCP: Jose Persia, MD         Medical Service: Internal Medicine Teaching Service         Attending Physician: Dr. Lenice Pressman    First Contact: Dr. Iona Beard Pager: [phone]  Second Contact: Dr. Harlow Ohms Pager: [phone]       After Hours (After 5p/  First Contact Pager: [phone]  weekends / holidays): Second Contact Pager: [phone]   Chief Complaint: Fatigue, nausea, body soreness  History of Present Illness:   Jennifer [name] is a 50 year old woman with past medical history of uncontrolled insulin-dependent type 2 diabetes mellitus, atrial fibrillation, hypertension, hyperlipidemia, iron deficiency anemia who presented to the ED from clinic with 2-day history of fatigue, myalgias, and decreased intake.  History provided by patient and daughter who was at bedside. Daughter interpreted on the patient's behalf.    Patient was in her usual state of health until two days ago when she began to experience dizziness and a headache. This continued, and today she developed generalized body pain, sharp pain in her left ankle, and altered mental status. Daughter reports since arriving to the ED the patient has been saying random things as if she is hallucinating but was able to answer the daughter's questions.   During our evaluation, the patient endorses continued cough which has been ongoing for the last 2 months, intermittent chills, mild diffuse headache and neck soreness, nausea (resolved with nausea med in the ED), constipation (Last BM 3-5 days ago), increased urinary frequency. She denies congestion, throat pain, shortness of breath, chest pain, dysuria, abdominal pain. Denies sick contacts. Daughter reports the daughter received two COVID vaccines. Daughter reports the patient's diet is "not very good." Notes she mostly drinks sweet tea and juice,  she does not eat healthy food. Notes that her son tries to encourage her to eat salads, but she defaults to sweets. She has not taken any of her medications except for her insulin for the last couple of days. Daughter reports that for the last year the patient mostly sleeps during the day, has had depressed mood, little motivation to do things. Has expressed frustration with feeling like because of her diabetes she is limited in the things she is supposed to eat. Patient states her mood is "ok," denies suicidal ideation.  ED Course:   Meds:  Current Meds  Medication Sig  . Accu-Chek FastClix Lancets MISC Check blood sugar 4 times a day  . acetaminophen (TYLENOL) 500 MG tablet Take 500 mg by mouth every 6 (six) hours as needed for mild pain.  . Blood Glucose Monitoring Suppl (ACCU-CHEK GUIDE) w/Device KIT 1 each by Does not apply route 4 (four) times daily.  Marland Kitchen glucose blood (ACCU-CHEK GUIDE) test strip Check blood sugar 4 times per day  . insulin aspart protamine- aspart (NOVOLOG MIX 70/30) (70-30) 100 UNIT/ML injection Inject 15-30 Units into the skin See admin instructions. Inject 30 units in the morning and then inject 15 units at supper per daughter  . Insulin Pen Needle (B-D UF III MINI PEN NEEDLES) 31G X 5 MM MISC Use pen needle daily for injections  Per chart review additionally prescribed: Apixaban 5 mg twice daily Atorvastatin 40 mg daily Metformin glucophage XR 500 mg daily   Allergies: Allergies as of 06/15/2020  . (  No Known Allergies)   Past Medical History:  Diagnosis Date  . Anemia, iron deficiency   . Atrial fibrillation ([place])   . Blindness of left eye   . Decreased visual acuity    Left eye  . Depression   . 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 Hair loss   . History of fracture of clavicle 05/18/2015  . Hyperlipidemia   . Hypertension    . Iron deficiency anemia 05/13/2013  . Pain, dental 08/19/2018   Tooth pain/facial swelling: has poor dentition at baseline, history of dental abscess.  She has not seen a dentist in about one year.  Dentist is on [address].  No fevers chills or systemic symptoms.  Diabetes has been poorly controlled for some time.  She said it has improved recently averaging around 140.  Called the dentist said they would not see patients until May 14th but the urgency o  . Pap smear abnormality of cervix with LGSIL   . Routine/ritual circumcision   . Type II diabetes mellitus ([place])   . Uveitis     Family History:  Family History  Problem Relation Age of Onset  . Diabetes Father   . Hypertension Other     Social History:  Social History   Socioeconomic History  . Marital status: Married    Spouse name: Not on file  . Number of children: Not on file  . Years of education: Not on file  . Highest education level: Not on file  Occupational History  . Not on file  Tobacco Use  . Smoking status: Never Smoker  . Smokeless tobacco: Never Used  Vaping Use  . Vaping Use: Never used  Substance and Sexual Activity  . Alcohol use: No  . Drug use: No  . Sexual activity: Not Currently    Birth control/protection: Post-menopausal  Other Topics Concern  . Not on file  Social History Narrative   ** Merged History Encounter **       ** Merged History Encounter **  Pt is [place].    Social Determinants of Health   Financial Resource Strain: Not on file  Food Insecurity: Not on file  Transportation Needs: Not on file  Physical Activity: Not on file  Stress: Not on file  Social Connections: Not on file  Intimate Partner Violence: Not on file    Review of Systems: A complete ROS was negative except as per HPI.   Physical Exam: Blood pressure (!) 150/80, pulse 81, temperature 98 F (36.7 C), temperature source Oral, resp. rate 19, last menstrual period 08/21/2016, SpO2 93  %. Constitutional: tired and chronically ill-appearing woman lying in ED stretcher, prefers sheet pulled over her head HENT: normocephalic atraumatic, mucous membranes dry Eyes: conjunctiva non-erythematous, left cornea hazy Neck: supple, no lymphadenopathy Cardiovascular: regular rate and rhythm, no m/r/g; trace bilateral ankle edema Pulmonary/Chest: normal work of breathing on room air, lungs clear to auscultation bilaterally; no wheezes, rales, or rhonchi Abdominal: soft, non-tender, non-distended MSK: normal bulk and tone Neurological: alert & oriented to person, place, and season, unable to recall year or month; neurological exam notable for patient having difficulty understanding some exam maneuvers; R pupil 3 mm and minimally reactive to light, L cornea cloudy; EOMs intact; CN II-XII intact; no facial asymmetry; 5/5 strength grossly througout, sensation grossly intact; gait assessment deferred  Skin: warm and dry  Psych: "okay" mood, flat affect  Imaging: none  EKG: personally reviewed my interpretation is***  Assessment & Plan by Problem: Active Problems:   AKI (acute kidney injury) ([place])   Hyperglycemia   COVID-19 virus infection  Jennifer [name] is a 50 year old woman with past medical history of uncontrolled insulin-dependent type 2 diabetes mellitus, atrial fibrillation, hypertension, hyperlipidemia, iron deficiency anemia who presented to the ED from clinic with 2-day history of fatigue, myalgias, and decreased intake.  Headaches:   Hyperglycemia In Setting of TIIDM:  Nausea Fatigue  Patient arrived to the ED from [facility] and found to have a glucose of 624 without associated acidosis. VBG shows pH of 7.4. U/A is pending. Given no signs of acidosis, DKA is quite low on the differential. Will place patient on Endotool for hyperglycemic control. No sign of infection,  -Initiate Endotool   trulicity discontinued on 05/31/20 secondary to concern for possible contribution to  early satiety.   COVID-19 infection *** -   Acute Kidney Injury:  Patient presents with an AKI with a sCr of 1.62 from last lab of 0.99, likely in the setting of profound ***. She has received fluids in the ED. Given the setting of her hyperglycemia, this is likely pre-renal in etiology. Will continue fluid resuscitation at this time. Will obtain FeNa labs.  - Continue fluid resuscitation - Urine Na, Cr - Trend BMP  - Strict I/O  - Avoid nephrotoxic agents    Hyperkalemia and Hyponatremia in the Setting of Hyperglycemia:  Patient Hyperkalemic for a K 5.5 on VBG and Hyponatremic, Na of 127 on presentation to the ED. Given her hyperglycemia her Hyperkalemia is to be expected. Her corrected sodium is 135. Will initiate Endotrol and continue to monitor with serial BMPs. Given hyperkalemia will order ***.  - Start Endotrol  - Serial BMPs - EKG  Atrial Fibrillation:  Patient with Hx of a. Fib on Eliquis 5 mg daily. Patient HR stable with *** a. Fib on *** and *** during interview. *** need for rate control at this time.  - Eliquis 15m   HTN:  Patient presented to the ED with elevated pressures with systolics in the 2811X down to 150 when seen by the admitting team. She *** symptoms. She does have a AKI, likely in the setting of dehydration. Her EKG shows *** from previous. Awaiting U/A for signs of proteinuria. Per office visit on 1/24 patient is on amlodipine 10 mg, but does not appear on med rec, which we will start back here.  - amlodipine 10 mg Daily   HFpEF:  Last ECHO on 03/16/20 showed LVEF of 60-65%. On Lasix PRN at home for lower extremity swelling.  - Hold Lasix in setting of AKI.    Low BP - discontinued amlodipine. Suspected due to autonomic dysfunction from uncontrolled diabetes and mild dehydration.  Diet: {NAMES:3044014::"Normal","Heart Healthy","Carb-Modified","Renal","Carb/Renal","NPO","TPN","Tube Feeds"} VTE:  {NAMES:3044014::"Heparin","Enoxaparin","SCDs","NOAC","None"} IVF: {NAMES:3044014::"None","NS","1/2 NS","LR","D5","D10"},{NAMES:3044014::"None","10cc/hr","25cc/hr","50cc/hr","75cc/hr","100cc/hr","110cc/hr","125cc/hr","Bolus"} Code: {NAMES:3044014::"Full","DNR","DNI","DNR/DNI","Comfort Care","Unknown"}  Prior to Admission Living Arrangement: {NAMES:3044014::"Home, living ***","SNF, ***","Homeless","***"} Anticipated Discharge Location: {NAMES:3044014::"Home","SNF","CIR","***"} Barriers to Discharge: ***  Dispo: Admit patient to {STATUS:3044014::"Observation with expected length of stay less than 2 midnights.","Inpatient with expected length of stay greater than 2 midnights."}   Signed: JAlexandria Lodge MD PGY-1 Internal Medicine Teaching Service Pager: [phone]/24/2022 After 5pm on weekdays and 1pm on weekends: On Call pager: [phone]    Date: 06/15/2020               Patient Name:  SMahoganie BasherMRN: [id number] DOB: 110/19/1972Age /  Sex: 50 y.o., female   PCP: Jose Persia, MD         Medical Service: Internal Medicine Teaching Service         Attending Physician: Dr. Oda Kilts, MD    First Contact: Dr. Alexandria Lodge Pager: [phone]  Second Contact: Dr. Marland Kitchen Pager: [phone]       After Hours (After 5p/  First Contact Pager: [phone]  weekends / holidays): Second Contact Pager: [phone]   Chief Complaint: ***  History of Present Illness: ***     Lab Orders     Resp Panel by RT-PCR (Flu A&B, Covid) Nasopharyngeal Swab     Lipase, blood     Comprehensive metabolic panel     CBC     Urinalysis, Routine w reflex microscopic     Comprehensive metabolic panel     CBC     Basic metabolic panel     Osmolality     Beta-hydroxybutyric acid     I-Stat venous blood gas, ([facility])     CBG monitoring, ED     CBG monitoring, ED     CBG monitoring, ED   Meds:  Current Meds  Medication Sig  . Accu-Chek FastClix Lancets MISC Check blood sugar 4 times a day  .  acetaminophen (TYLENOL) 500 MG tablet Take 500 mg by mouth every 6 (six) hours as needed for mild pain.  . Blood Glucose Monitoring Suppl (ACCU-CHEK GUIDE) w/Device KIT 1 each by Does not apply route 4 (four) times daily.  Marland Kitchen glucose blood (ACCU-CHEK GUIDE) test strip Check blood sugar 4 times per day  . insulin aspart protamine- aspart (NOVOLOG MIX 70/30) (70-30) 100 UNIT/ML injection Inject 15-30 Units into the skin See admin instructions. Inject 30 units in the morning and then inject 15 units at supper per daughter  . Insulin Pen Needle (B-D UF III MINI PEN NEEDLES) 31G X 5 MM MISC Use pen needle daily for injections    Social History: ***  Family History: ***  Allergies: Allergies as of 06/15/2020  . (No Known Allergies)   Past Medical History:  Diagnosis Date  . Anemia, iron deficiency   . Atrial fibrillation ([place])   . Blindness of left eye   . Decreased visual acuity    Left eye  . Depression   . 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 Hair loss   . History of fracture of clavicle 05/18/2015  . Hyperlipidemia   . Hypertension   . Iron deficiency anemia 05/13/2013  . Pain, dental 08/19/2018   Tooth pain/facial swelling: has poor dentition at baseline, history of dental abscess.  She has not seen a dentist in about one year.  Dentist is on [address].  No fevers chills or systemic symptoms.  Diabetes has been poorly controlled for some time.  She said it has improved recently averaging around 140.  Called the dentist said they would not see patients until May 14th but the urgency o  . Pap smear abnormality of cervix with LGSIL   . Routine/ritual circumcision   . Type II diabetes mellitus ([place])   . Uveitis      Review of Systems: A complete ROS was negative except as per HPI.   Physical Exam: Blood pressure (!) 149/79, pulse 83,  temperature 98 F (36.7 C), temperature source Oral, resp. rate (!) 21, height 5'  7" (1.702 m), weight 82 kg, last menstrual period 08/21/2016, SpO2 94 %. Constitutional: well-appearing *** lying in bed, in no acute distress HENT: normocephalic atraumatic, mucous membranes moist Eyes: conjunctiva non-erythematous Neck: supple Cardiovascular: regular rate and rhythm, no m/r/g Pulmonary/Chest: normal work of breathing on room air, lungs clear to auscultation bilaterally Abdominal: soft, non-tender, non-distended MSK: normal bulk and tone Neurological: alert & oriented x 3, 5/5 strength in bilateral upper and lower extremities, normal gait Skin: *** Psych: ***   Labs: CBC    Component Value Date/Time   WBC 5.5 06/15/2020 1538   RBC 4.68 06/15/2020 1538   HGB 13.9 06/15/2020 1924   HGB 8.9 (L) 04/19/2020 1555   HCT 41.0 06/15/2020 1924   HCT 29.8 (L) 04/19/2020 1555   PLT 174 06/15/2020 1538   PLT 262 04/19/2020 1555   MCV 82.7 06/15/2020 1538   MCV 84 04/19/2020 1555   MCH 26.3 06/15/2020 1538   MCHC 31.8 06/15/2020 1538   RDW 14.9 06/15/2020 1538   RDW 13.2 04/19/2020 1555   LYMPHSABS 1.6 05/15/2020 1430   LYMPHSABS 2.2 04/19/2020 1555   MONOABS 0.5 05/15/2020 1430   EOSABS 0.2 05/15/2020 1430   EOSABS 0.2 04/19/2020 1555   BASOSABS 0.0 05/15/2020 1430   BASOSABS 0.0 04/19/2020 1555     CMP     Component Value Date/Time   NA 131 (L) 06/15/2020 2211   NA 139 04/19/2020 1555   K 4.6 06/15/2020 2211   CL 98 06/15/2020 2211   CO2 24 06/15/2020 2211   GLUCOSE 375 (H) 06/15/2020 2211   BUN 26 (H) 06/15/2020 2211   BUN 14 04/19/2020 1555   CREATININE 1.27 (H) 06/15/2020 2211   CREATININE 0.63 10/28/2014 1649   CALCIUM 9.1 06/15/2020 2211   PROT 8.1 06/15/2020 1538   PROT 6.8 01/30/2018 1440   ALBUMIN 3.3 (L) 06/15/2020 1538   ALBUMIN 3.6 01/30/2018 1440   AST 25 06/15/2020 1538   ALT 16 06/15/2020 1538   ALKPHOS 121 06/15/2020 1538   BILITOT 0.7 06/15/2020 1538    BILITOT <0.2 01/30/2018 1440   GFRNONAA 52 (L) 06/15/2020 2211   GFRNONAA >89 10/28/2014 1649   GFRAA 75 04/19/2020 1555   GFRAA >89 10/28/2014 1649      Assessment & Plan by Problem: Active Problems:   AKI (acute kidney injury) ([place])   Hyperglycemia   Landy Mace is a 50 y.o. *** with pertinent PMH of *** who presented with *** and admit for *** on hospital day 0  *** ***  *** ***  *** ***   Signed: Alexandria Lodge, MD PGY-1 Internal Medicine Teaching Service Pager: [phone] 06/15/2020

## 2020-06-15 NOTE — ED Triage Notes (Signed)
Pt c/o pain all over, worse in her right ankle where she has had a recent surgery. Also c/o nausea, no vomiting.

## 2020-06-15 NOTE — ED Notes (Signed)
BGL 517

## 2020-06-15 NOTE — ED Notes (Signed)
Admitting team at bedside.

## 2020-06-15 NOTE — ED Notes (Signed)
Pt daughter at side. States that mother is having vision changes. RN notified.

## 2020-06-15 NOTE — ED Provider Notes (Signed)
[facility] EMERGENCY DEPARTMENT Provider Note   CSN: [id number] Arrival date & time: 06/15/20  1507     History Chief Complaint  Patient presents with  . Nausea    Jennifer [name] is a 50 y.o. female w/ h/o poorly controlled T2DM on insulin, ?gastroparesis, HFpEF, HTN, HLD, Afib on Eliquis, and IDA who presents to the ED from PCP office for generalized weakness, myalgias, and dehydration. Poor PO intake over the last several days with diffuse myalgias. Took insulin once this AM, but none since that time and reports drinking sweet tea throughout today. Seen at PCP office and ill-appearing on exam, prompting her to be sent to ED. Denies fever, chills, cough, chest pain, SOB, abdominal pain, diarrhea, or urinary symptoms.   The history is provided by the patient, medical records and a relative. The history is limited by a language barrier. Language interpreter used: Patient preferred to use daughter as Optometrist.  Weakness Severity:  Moderate Onset quality:  Gradual Duration: several days. Timing:  Constant Progression:  Unchanged Chronicity:  New Context: dehydration   Relieved by:  Nothing Worsened by:  Activity Ineffective treatments:  Lying down Associated symptoms: dizziness, lethargy and nausea   Associated symptoms: no abdominal pain, no arthralgias, no chest pain, no cough, no diarrhea, no dysuria, no fever, no hematochezia, no loss of consciousness, no near-syncope, no seizures, no shortness of breath and no vomiting   Risk factors: diabetes        Past Medical History:  Diagnosis Date  . Anemia, iron deficiency   . Atrial fibrillation ([place])   . Blindness of left eye   . Decreased visual acuity    Left eye  . Depression   . 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 Hair loss   . History of fracture of clavicle  05/18/2015  . Hyperlipidemia   . Hypertension   . Iron deficiency anemia 05/13/2013  . Pain, dental 08/19/2018   Tooth pain/facial swelling: has poor dentition at baseline, history of dental abscess.  She has not seen a dentist in about one year.  Dentist is on [address].  No fevers chills or systemic symptoms.  Diabetes has been poorly controlled for some time.  She said it has improved recently averaging around 140.  Called the dentist said they would not see patients until May 14th but the urgency o  . Pap smear abnormality of cervix with LGSIL   . Routine/ritual circumcision   . Type II diabetes mellitus ([place])   . Uveitis     Patient Active Problem List   Diagnosis Date Noted  . Early satiety 05/31/2020  . UTI (urinary tract infection) 05/16/2020  . Elevated liver enzymes 05/15/2020  . Hyperglycemia due to diabetes mellitus ([place]) 05/15/2020  . Closed trimalleolar fracture of right ankle 05/02/2020  . Normocytic anemia 03/27/2020  . Cellulitis (Left 2nd toe) 03/27/2020  . Cellulitis of left foot 03/17/2020  . AKI (acute kidney injury) ([place]) 03/14/2020  . Hypotension 03/10/2020  . Right ankle injury 09/28/2019  . Bilateral lower extremity edema 06/07/2019  . Atrial fibrillation ([place]) 09/20/2018  . Migraine   . Hematuria 08/24/2018  . Vulvovaginitis 05/21/2018  . (HFpEF) heart failure with preserved ejection fraction ([place]) 01/01/2018  . 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  . High risk medication use 09/16/2017  . Pseudophakia of right eye 09/16/2017  . Left anterior  shoulder pain 07/06/2017  . Hypertension associated with diabetes ([place]) 07/23/2016  . 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 Fatigue 04/02/2016  . 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 Vitamin D deficiency 10/30/2014  . Iron deficiency anemia 05/13/2013  . Hair loss 03/24/2012  . Healthcare maintenance 02/26/2011  . Hyperlipidemia 05/05/2006  . Type II diabetes mellitus ([place])  04/22/1998    Past Surgical History:  Procedure Laterality Date  . CESAREAN SECTION     x 1  . EYE SURGERY Bilateral    Lasik  . MULTIPLE TOOTH EXTRACTIONS     bottom denture  . ORIF ANKLE FRACTURE Right 05/05/2020   Procedure: OPEN REDUCTION INTERNAL FIXATION (ORIF) ANKLE FRACTURE;  Surgeon: Shona Needles, MD;  Location: [facility];  Service: Orthopedics;  Laterality: Right;     OB History    Gravida  7   Para  4   Term  4   Preterm  0   AB  3   Living        SAB  3   IAB  0   Ectopic  0   Multiple      Live Births              Family History  Problem Relation Age of Onset  . Diabetes Father   . Hypertension Other     Social History   Tobacco Use  . Smoking status: Never Smoker  . Smokeless tobacco: Never Used  Vaping Use  . Vaping Use: Never used  Substance Use Topics  . Alcohol use: No  . Drug use: No    Home Medications Prior to Admission medications   Medication Sig Start Date End Date Taking? Authorizing Provider  Accu-Chek FastClix Lancets MISC Check blood sugar 4 times a day 06/07/19  Yes Jose Persia, MD  acetaminophen (TYLENOL) 500 MG tablet Take 500 mg by mouth every 6 (six) hours as needed for mild pain.   Yes [provider]  Blood Glucose Monitoring Suppl (ACCU-CHEK GUIDE) w/Device KIT 1 each by Does not apply route 4 (four) times daily. 11/19/18  Yes Jose Persia, MD  glucose blood (ACCU-CHEK GUIDE) test strip Check blood sugar 4 times per day 06/07/19  Yes Jose Persia, MD  insulin aspart protamine- aspart (NOVOLOG MIX 70/30) (70-30) 100 UNIT/ML injection Inject 15-30 Units into the skin See admin instructions. Inject 30 units in the morning and then inject 15 units at supper per daughter   Yes [provider]  Insulin Pen Needle (B-D UF III MINI PEN NEEDLES) 31G X 5 MM MISC Use pen needle daily for injections 05/30/20  Yes Jose Persia, MD  ferrous sulfate 325 (65 FE) MG tablet Take 1 tablet (325 mg total) by  mouth every Monday, Wednesday, and Friday for 14 days. Patient not taking: Reported on 05/16/2020 04/24/20 05/08/20  Mosetta Anis, MD    Allergies    Patient has no known allergies.  Review of Systems   Review of Systems  Constitutional: Positive for activity change, appetite change and fatigue. Negative for chills and fever.  HENT: Negative for ear pain and sore throat.   Eyes: Negative for pain and visual disturbance.  Respiratory: Negative for cough and shortness of breath.   Cardiovascular: Negative for chest pain, palpitations and near-syncope.  Gastrointestinal: Positive for nausea. Negative for abdominal pain, diarrhea, hematochezia and vomiting.  Genitourinary: Negative for dysuria and hematuria.  Musculoskeletal: Negative for arthralgias and back pain.  Skin: Negative for color change and rash.  Neurological: Positive for dizziness and weakness. Negative for seizures, loss of consciousness and syncope.  All other systems reviewed and are negative.   Physical Exam Updated Vital Signs BP (!) 149/79   Pulse 83   Temp 98 F (36.7 C) (Oral)   Resp (!) 21   Ht 5' 7" (1.702 m)   Wt 82 kg   LMP 08/21/2016 (Exact Date)   SpO2 94%   BMI 28.31 kg/m   Physical Exam Vitals and nursing note reviewed.  Constitutional:      General: She is not in acute distress.    Appearance: She is well-developed and well-nourished. She is obese. She is ill-appearing.  HENT:     Head: Normocephalic and atraumatic.     Right Ear: External ear normal.     Left Ear: External ear normal.     Nose: Nose normal.     Mouth/Throat:     Mouth: Mucous membranes are dry.     Pharynx: Oropharynx is clear. No oropharyngeal exudate or posterior oropharyngeal erythema.  Eyes:     General: No scleral icterus.       Right eye: No discharge.        Left eye: No discharge.     Conjunctiva/sclera: Conjunctivae normal.  Cardiovascular:     Rate and Rhythm: Normal rate and regular rhythm.     Pulses: Normal  pulses.     Heart sounds: Normal heart sounds. No murmur heard.   Pulmonary:     Effort: Pulmonary effort is normal. No respiratory distress.     Breath sounds: Normal breath sounds. No wheezing, rhonchi or rales.  Abdominal:     General: Abdomen is flat. There is no distension.     Palpations: Abdomen is soft.     Tenderness: There is no abdominal tenderness. There is no guarding or rebound.  Musculoskeletal:     Cervical back: Neck supple.     Right lower leg: No edema.     Left lower leg: No edema.     Comments: CAM boot to L lower leg - upon removal, no erythema, tenderness, swelling, or deformity noted. NVI.  Skin:    General: Skin is warm and dry.     Findings: No rash.  Neurological:     General: No focal deficit present.     Mental Status: She is alert and oriented to person, place, and time.  Psychiatric:        Mood and Affect: Mood and affect normal.     ED Results / Procedures / Treatments   Labs (all labs ordered are listed, but only abnormal results are displayed) Labs Reviewed  RESP PANEL BY RT-PCR (FLU A&B, COVID) ARPGX2 - Abnormal; Notable for the following components:      Result Value   SARS Coronavirus 2 by RT PCR POSITIVE (*)    All other components within normal limits  COMPREHENSIVE METABOLIC PANEL - Abnormal; Notable for the following components:   Sodium 127 (*)    Chloride 93 (*)    Glucose, Bld 624 (*)    BUN 28 (*)    Creatinine, Ser 1.62 (*)    Albumin 3.3 (*)    GFR, Estimated 39 (*)    All other components within normal limits  URINALYSIS, ROUTINE W REFLEX MICROSCOPIC - Abnormal; Notable for the following components:   APPearance HAZY (*)    Glucose, UA >=500 (*)    Hgb urine dipstick SMALL (*)  Protein, ur 100 (*)    Leukocytes,Ua LARGE (*)    WBC, UA >50 (*)    Bacteria, UA RARE (*)    All other components within normal limits  I-STAT VENOUS BLOOD GAS, ED - Abnormal; Notable for the following components:   pH, Ven 7.436 (*)     pCO2, Ven 34.7 (*)    Sodium 129 (*)    Potassium 5.5 (*)    Calcium, Ion 1.02 (*)    All other components within normal limits  CBG MONITORING, ED - Abnormal; Notable for the following components:   Glucose-Capillary 517 (*)    All other components within normal limits  LIPASE, BLOOD  CBC  COMPREHENSIVE METABOLIC PANEL  CBC  BASIC METABOLIC PANEL  BASIC METABOLIC PANEL  BASIC METABOLIC PANEL  OSMOLALITY  BETA-HYDROXYBUTYRIC ACID  BETA-HYDROXYBUTYRIC ACID  CBG MONITORING, ED    EKG None  Radiology No results found.  Procedures Procedures  Medications Ordered in ED Medications  heparin injection 5,000 Units (5,000 Units Subcutaneous Patient Refused/Not Given 06/15/20 2114)  acetaminophen (TYLENOL) tablet 650 mg (has no administration in time range)    Or  acetaminophen (TYLENOL) suppository 650 mg (has no administration in time range)  polyethylene glycol (MIRALAX / GLYCOLAX) packet 17 g (17 g Oral Not Given 06/15/20 2109)  lactated ringers bolus 1,640 mL (has no administration in time range)  insulin regular, human (MYXREDLIN) 100 units/ 100 mL infusion (has no administration in time range)  lactated ringers infusion (has no administration in time range)  dextrose 5 % in lactated ringers infusion (0 mLs Intravenous Hold 06/15/20 2142)  dextrose 50 % solution 0-50 mL (has no administration in time range)  potassium chloride 10 mEq in 100 mL IVPB (has no administration in time range)  lactated ringers bolus 1,000 mL (0 mLs Intravenous Stopped 06/15/20 2114)  ondansetron (ZOFRAN) injection 4 mg (4 mg Intravenous Given 06/15/20 1833)  insulin aspart (novoLOG) injection 10 Units (10 Units Subcutaneous Given 06/15/20 1843)    ED Course  I have reviewed the triage vital signs and the nursing notes.  Pertinent labs & imaging results that were available during my care of the patient were reviewed by me and considered in my medical decision making (see chart for details).    MDM  Rules/Calculators/A&P                          Patient is a 31yoF with history and physical as described above who presents to the ED for generalized weakness, nausea, dehydration, and hyperglycemia. VS reassuring and HDS. Patient mentating appropriately and appears dehydrated on exam. Initial workup started in triage and notable for glucose 624 and Cr 1.62. Initial treatment includes IVF bolus and insulin.  Additional workup reassuring for DKA. UA and COVID pending. Glucose improved to 300s following initial interventions. Given ongoing hyperglycemia in setting of poor PO intake and AKI, patient warrants admission for further hydration, PO, glycemic control, and repeat BMP to monitor renal function. Discussed patient with internal medicine who will admit. Patient otherwise remained HDS with no further acute events during ED course. Final Clinical Impression(s) / ED Diagnoses Final diagnoses:  Nausea  Hyperglycemia  AKI (acute kidney injury) [facility])    Rx / DC Orders ED Discharge Orders    None       Christy Gentles, MD 06/15/20 2229    Lucrezia Starch, MD 06/16/20 2215    Lucrezia Starch, MD 06/27/20 [phone]

## 2020-06-15 NOTE — H&P (Incomplete)
Date: 06/15/2020               Patient Name:  Jennifer [name] MRN: [id number]  DOB: 10-16-1970 Age / Sex: 50 y.o., female   PCP: Jose Persia, MD         Medical Service: Internal Medicine Teaching Service         Attending Physician: Dr. Lenice Pressman    First Contact: Dr. Iona Beard Pager: [phone]  Second Contact: Dr. Harlow Ohms Pager: [phone]       After Hours (After 5p/  First Contact Pager: [phone]  weekends / holidays): Second Contact Pager: [phone]   Chief Complaint: Fatigue, nausea, body soreness  History of Present Illness:   Jennifer [name] is a 50 year old woman with past medical history of uncontrolled insulin-dependent type 2 diabetes mellitus, atrial fibrillation, hypertension, hyperlipidemia, iron deficiency anemia who presented to the ED from clinic with 2-day history of fatigue, myalgias, nausea and decreased intake.  History provided by patient and daughter who was at bedside. Daughter interpreted on the patient's behalf.    Patient was in her usual state of health until two days ago when she began to experience dizziness and a headache. This continued, and today she developed generalized body pain, sharp pain in her left ankle, and altered mental status. Daughter reports since arriving to the ED the patient has been saying random things as if she is hallucinating but was able to answer the daughter's questions. States while waiting for evaluation in the ED the patient complained of not being able to see out of her R eye for ~30 minutes but has since regained vision.  During our evaluation, the patient endorses continued cough which has been ongoing for the last 2 months, intermittent chills, mild diffuse headache and neck soreness, nausea (resolved with nausea med in the ED), constipation (Last BM 3-5 days ago), increased urinary frequency. She denies congestion, throat pain, shortness of breath, chest pain, dysuria, abdominal pain. Denies sick  contacts. Daughter reports the daughter received two COVID vaccines. Daughter reports the patient's diet is "not very good." Notes she mostly drinks sweet tea and juice, she does not eat healthy food. Notes that her son tries to encourage her to eat salads, but she defaults to sweets. She has not taken any of her medications except for her insulin for the last couple of days. Daughter reports that for the last year the patient mostly sleeps during the day, has had depressed mood, little motivation to do things. Has expressed frustration with feeling like because of her diabetes she is limited in the things she is supposed to eat. Patient states her mood is "ok," denies suicidal ideation.  ED Course: Afebrile, RR 18-22, P 80s-90s, hypertensive with BP 140s-205/80s-105, SpO2 >93% on room air. CBC with WBC 5.5, hgb 12.3 with MCV 82.7, plts 174k. CMP with Na 127 (corrects to 140), K 5.1, Cl 98, bicarb 24, glucose 624 BUN/Cr 28/1.62 (baseline creatinine ~1.1), albumin 3.3, AST/ALT/ALP 25/16/121. Serum osmolality elevated at 305. Beta-hydroxybutyric acid wnl at 0.13. COVID PCR positive. Urinalysis with SG 1.015, >500 glucose, negative ketones small Hgb, 100 protein, large leukocytes (>50 WBC), negative nitrites, rare bacteria  In the ED, she was given 1 L LR, ondansetron, 10 units of Lantus. IMTS consulted for admission.  Meds:  Current Meds  Medication Sig  . Accu-Chek FastClix Lancets MISC Check blood sugar 4 times a day  . acetaminophen (TYLENOL) 500 MG tablet Take 500 mg by mouth every 6 (  six) hours as needed for mild pain.  . Blood Glucose Monitoring Suppl (ACCU-CHEK GUIDE) w/Device KIT 1 each by Does not apply route 4 (four) times daily.  Marland Kitchen glucose blood (ACCU-CHEK GUIDE) test strip Check blood sugar 4 times per day  . insulin aspart protamine- aspart (NOVOLOG MIX 70/30) (70-30) 100 UNIT/ML injection Inject 15-30 Units into the skin See admin instructions. Inject 30 units in the morning and then inject 15  units at supper per daughter  . Insulin Pen Needle (B-D UF III MINI PEN NEEDLES) 31G X 5 MM MISC Use pen needle daily for injections  Per chart review additionally prescribed: Apixaban 5 mg twice daily Atorvastatin 40 mg daily Metformin glucophage XR 500 mg daily   Allergies: Allergies as of 06/15/2020  . (No Known Allergies)   Past Medical History:  Diagnosis Date  . Anemia, iron deficiency   . Atrial fibrillation ([place])   . Blindness of left eye   . Decreased visual acuity    Left eye  . Depression   . 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 Hair loss   . History of fracture of clavicle 05/18/2015  . Hyperlipidemia   . Hypertension   . Iron deficiency anemia 05/13/2013  . Pain, dental 08/19/2018   Tooth pain/facial swelling: has poor dentition at baseline, history of dental abscess.  She has not seen a dentist in about one year.  Dentist is on [address].  No fevers chills or systemic symptoms.  Diabetes has been poorly controlled for some time.  She said it has improved recently averaging around 140.  Called the dentist said they would not see patients until May 14th but the urgency o  . Pap smear abnormality of cervix with LGSIL   . Routine/ritual circumcision   . Type II diabetes mellitus ([place])   . Uveitis     Family History:  Family History  Problem Relation Age of Onset  . Diabetes Father   . Hypertension Other     Social History:  Social History   Socioeconomic History  . Marital status: Married    Spouse name: Not on file  . Number of children: Not on file  . Years of education: Not on file  . Highest education level: Not on file  Occupational History  . Not on file  Tobacco Use  . Smoking status: Never Smoker  . Smokeless tobacco: Never Used  Vaping Use  . Vaping Use: Never used  Substance and Sexual Activity  .  Alcohol use: No  . Drug use: No  . Sexual activity: Not Currently    Birth control/protection: Post-menopausal  Other Topics Concern  . Not on file  Social History Narrative   ** Merged History Encounter **       ** Merged History Encounter **  Pt is [place].    Social Determinants of Health   Financial Resource Strain: Not on file  Food Insecurity: Not on file  Transportation Needs: Not on file  Physical Activity: Not on file  Stress: Not on file  Social Connections: Not on file  Intimate Partner Violence: Not on file    Review of Systems: A complete ROS was negative except as per HPI.   Physical Exam: Blood pressure (!) 150/80, pulse 81, temperature 98 F (36.7 C), temperature source Oral, resp. rate 19, last menstrual period 08/21/2016, SpO2 93 %.  Constitutional: tired and chronically ill-appearing woman lying in ED stretcher, prefers sheet pulled over her head HENT: normocephalic atraumatic, mucous membranes dry Eyes: conjunctiva non-erythematous, left cornea hazy Neck: supple, no lymphadenopathy Cardiovascular: regular rate and rhythm, no m/r/g; trace bilateral ankle edema Pulmonary/Chest: normal work of breathing on room air, lungs clear to auscultation bilaterally; no wheezes, rales, or rhonchi Abdominal: soft, non-tender, non-distended MSK: normal bulk and tone; no tenderness to palpation or movement of R ankle Neurological: alert & oriented to person, place, and season, unable to recall year or month; neurological exam notable for patient having difficulty understanding some exam maneuvers; R pupil 3 mm and minimally reactive to light, L cornea cloudy; EOMs intact; no facial asymmetry; strength exam limited by patient participation vs weakness - 4/5 strength in bilateral upper extremities, 3/5 strength in bilateral lower extremities, sensation grossly intact; patient appears confused during finger-to-nose assessment, touches her finger to her own finger despite  repeated instruction; gait assessment deferred  Skin: warm and dry Psych: "okay" mood, flat affect  Imaging: none  EKG: none  Assessment & Plan by Problem: Active Problems:   AKI (acute kidney injury) ([place])   Hyperglycemia   COVID-19 virus infection  Ms. Kristia Jupiter is a 50 year old woman with past medical history of uncontrolled insulin-dependent type 2 diabetes mellitus, atrial fibrillation, hypertension, hyperlipidemia, iron deficiency anemia who presented to the ED from clinic with 2-day history of fatigue, myalgias, and decreased intake and admitted for hyperosmolar hyperglycemic state (HHS) and AKI in the setting of COVID-19 infection.  Hyperosmolar hyperglycemic state (HHS) Insulin-dependent type II diabetes mellitus Patient arrived to the ED from [facility] with fatigue and myalgias and found to have a glucose of 624 associated with elevated serum osmolality (305), VBG without acidosis (pH of 7.4), normal beta-hydroxybutyric acid. Urinalysis negative for ketones. Exhibiting mild confusion on exam. Endorsing blurry vision. Presentation consistent with HHS. Patient has history of DKA with admission in 02/2020, however does not meet criteria today. Will place patient on Endotool for hyperglycemic control. Appears dry on exam and has concomitant AKI, will treat with IVF. Suspect HHS triggered in setting of COVID-19 infection and reported history of dietary indiscretion. Patient was previously on Trulicity, however this was discontinued on 05/31/20 secondary to concern for possible contribution to early satiety. Reports adherence to home insulin regimen of Novolog 70/30 30 units in the AM and 15 units at dinner. Last Hgb A1c 10.1 on 05/15/20.  - s/p Lantus 10 u in the ED - s/p 1 L bolus LR in the ED - LR bolus 20 mL/kg - Initiate Endotool - Hemoglobin A1c  Acute Kidney Injury Patient presents with an AKI with a sCr of 1.62 from baseline ~1.1, likely in the setting of dehydration with from  significant hyperglycemia. She has received fluids in the ED. Suspect pre-renal in etiology. Will continue fluid resuscitation at this time. Will obtain FeNa labs.  - Continue fluid resuscitation - Urine Na, Cr - Trend BMP  - Strict I/O  - Avoid nephrotoxic agents   COVID-19 infection Patient with 2-day history of fatigue, dizziness, nausea, myalgias, mild headache. No fever or chills, dyspnea, diarrhea. Maintaining oxygen saturation >93% on room air. No sick contacts. Found to be COVID positive on admission. Received Moderna COVID vaccination x 1 on 05/17/20. Given mild symptoms and lack of respiratory distress, no indication for steroids. Will treat with supportive care for now. Could consider remdesivir. - supportive care - Consider remdesivir - Robitussin DM - Tylenol PRN  Hyperkalemia and Hyponatremia  in the Setting of HHS Patient with hyperkalemia to 5.5 on VBG and hyponatremia to 127 on presentation to the ED. Hyperkalemia is to be expected. Her corrected sodium is 140. Will initiate Endotool as above and continue to monitor with serial BMPs. Given hyperkalemia will order EKG. - Endotool as above - Serial BMPs - EKG  Constipation Patient reports she has not had a bowel movement in 3-5 days. - Miralax daily  Pyuria Urinalysis with SG 1.015, >500 glucose, negative ketones, small Hgb, 100 protein, large leukocytes (>50 WBC), negative nitrites, rare bacteria. Patient without dysuria or suprapubic tenderness. No indication for treatment with antibiotics. - Consider obtaining urine culture and treating empirically for UTI if patient develops symptoms  Atrial Fibrillation Patient with Hx of a. Fib on Eliquis 5 mg twice daily per chart review. As above, patient's daughter reports patient has not been taking her PO medications for the last few days. Patient HR stable with P 80s-90s on admission, normal sinus rhythm on exam. No need for rate control at this time.  - Heparin - Consider  transition to Eliquis 5 mg twice daily  History of hypertension Patient presented to the ED with elevated pressures with systolics in the 282K, down to 150 when seen by the admitting team. Endorses mild headache and blurry vision, however these symptoms are likely due to hyperglycemia and dehydration. She does have a AKI, likely in the setting of dehydration. Urinalysis with 100 protein. Patient was previously on amlodipine 10 mg daily however was instructed to discontinue during her last office visit on 05/31/20 due to persistent hypotension suspected due to autonomic dysfunction from uncontrolled diabetes and mild dehydration. Will resume here due to elevation pressures on admission. - amlodipine 10 mg daily   HFpEF:  Last ECHO on 03/16/20 showed LVEF of 60-65%. Previously on Lasix 20 mg PRN at home for lower extremity swelling.  - Hold Lasix in setting of AKI  Depressed mood Patient's daughter reports for the last year patient has had depressed mood, anhedonia, hypersomnia, and low energy. On exam, patient states her mood is "okay," denies SI/HI, affect is flat. States patient has previously tried an antidepressant however cannot recall effect. Has not had therapy. Notes that these symptoms seemed to develop around the time her brother passed away and have been worse in setting of chronic illness. Daughter suspects patient's difficulty with adherence to medications is related to her mood. - consider referral to behavioral health (e.g. [facility] therapist) - consider initiation of SSRI or other therapy once recovered from this illness  Diet: NPO VTE: Heparin IVF: see above Code: Full  Prior to Admission Living Arrangement: Home, living with husband and two sons Anticipated Discharge Location: TBD Barriers to Discharge: management of HHS and AKI  Dispo: Admit patient to Inpatient with expected length of stay greater than 2 midnights.   Signed: Alexandria Lodge, MD PGY-1 Internal Medicine Teaching  Service Pager: [phone] 06/15/2020 After 5pm on weekdays and 1pm on weekends: On Call pager: [phone]

## 2020-06-15 NOTE — ED Notes (Signed)
Report attempted, floor unable to take report as nurse was busy. Number provided for call back.

## 2020-06-16 ENCOUNTER — Other Ambulatory Visit: Payer: Self-pay

## 2020-06-16 LAB — CREATININE, URINE, RANDOM: Creatinine, Urine: 61.6 mg/dL

## 2020-06-16 LAB — BASIC METABOLIC PANEL
Anion gap: 12 (ref 5–15)
Anion gap: 10 (ref 5–15)
Anion gap: 10 (ref 5–15)
BUN: 21 mg/dL — ABNORMAL HIGH (ref 6–20)
BUN: 19 mg/dL (ref 6–20)
BUN: 19 mg/dL (ref 6–20)
CO2: 22 mmol/L (ref 22–32)
CO2: 25 mmol/L (ref 22–32)
CO2: 23 mmol/L (ref 22–32)
Calcium: 8.9 mg/dL (ref 8.9–10.3)
Calcium: 8.8 mg/dL — ABNORMAL LOW (ref 8.9–10.3)
Calcium: 8.6 mg/dL — ABNORMAL LOW (ref 8.9–10.3)
Chloride: 101 mmol/L (ref 98–111)
Chloride: 101 mmol/L (ref 98–111)
Chloride: 101 mmol/L (ref 98–111)
Creatinine, Ser: 1.21 mg/dL — ABNORMAL HIGH (ref 0.44–1.00)
Creatinine, Ser: 1.2 mg/dL — ABNORMAL HIGH (ref 0.44–1.00)
Creatinine, Ser: 1.24 mg/dL — ABNORMAL HIGH (ref 0.44–1.00)
GFR, Estimated: 55 mL/min — ABNORMAL LOW (ref >60)
GFR, Estimated: 55 mL/min — ABNORMAL LOW (ref >60)
GFR, Estimated: 53 mL/min — ABNORMAL LOW (ref >60)
Glucose, Bld: 194 mg/dL — ABNORMAL HIGH (ref 70–99)
Glucose, Bld: 199 mg/dL — ABNORMAL HIGH (ref 70–99)
Glucose, Bld: 325 mg/dL — ABNORMAL HIGH (ref 70–99)
Potassium: 4 mmol/L (ref 3.5–5.1)
Potassium: 4.8 mmol/L (ref 3.5–5.1)
Potassium: 4.4 mmol/L (ref 3.5–5.1)
Sodium: 135 mmol/L (ref 135–145)
Sodium: 136 mmol/L (ref 135–145)
Sodium: 134 mmol/L — ABNORMAL LOW (ref 135–145)

## 2020-06-16 LAB — CBC
HCT: 33.1 % — ABNORMAL LOW (ref 36.0–46.0)
Hemoglobin: 10.7 g/dL — ABNORMAL LOW (ref 12.0–15.0)
MCH: 26.5 pg (ref 26.0–34.0)
MCHC: 32.3 g/dL (ref 30.0–36.0)
MCV: 81.9 fL (ref 80.0–100.0)
Platelets: 121 10*3/uL — ABNORMAL LOW (ref 150–400)
RBC: 4.04 MIL/uL (ref 3.87–5.11)
RDW: 14.8 % (ref 11.5–15.5)
WBC: 4.2 10*3/uL (ref 4.0–10.5)
nRBC: 0.5 % — ABNORMAL HIGH (ref 0.0–0.2)

## 2020-06-16 LAB — GLUCOSE, CAPILLARY
Glucose-Capillary: 100 mg/dL — ABNORMAL HIGH (ref 70–99)
Glucose-Capillary: 115 mg/dL — ABNORMAL HIGH (ref 70–99)
Glucose-Capillary: 140 mg/dL — ABNORMAL HIGH (ref 70–99)
Glucose-Capillary: 152 mg/dL — ABNORMAL HIGH (ref 70–99)
Glucose-Capillary: 171 mg/dL — ABNORMAL HIGH (ref 70–99)
Glucose-Capillary: 180 mg/dL — ABNORMAL HIGH (ref 70–99)
Glucose-Capillary: 323 mg/dL — ABNORMAL HIGH (ref 70–99)
Glucose-Capillary: 202 mg/dL — ABNORMAL HIGH (ref 70–99)
Glucose-Capillary: 132 mg/dL — ABNORMAL HIGH (ref 70–99)

## 2020-06-16 LAB — HEMOGLOBIN A1C
Hgb A1c MFr Bld: 11.6 % — ABNORMAL HIGH (ref 4.8–5.6)
Mean Plasma Glucose: 286.22 mg/dL

## 2020-06-16 LAB — COMPREHENSIVE METABOLIC PANEL
ALT: 11 U/L (ref 0–44)
AST: 19 U/L (ref 15–41)
Albumin: 2.6 g/dL — ABNORMAL LOW (ref 3.5–5.0)
Alkaline Phosphatase: 89 U/L (ref 38–126)
Anion gap: 9 (ref 5–15)
BUN: 23 mg/dL — ABNORMAL HIGH (ref 6–20)
CO2: 23 mmol/L (ref 22–32)
Calcium: 8.8 mg/dL — ABNORMAL LOW (ref 8.9–10.3)
Chloride: 103 mmol/L (ref 98–111)
Creatinine, Ser: 1.24 mg/dL — ABNORMAL HIGH (ref 0.44–1.00)
GFR, Estimated: 53 mL/min — ABNORMAL LOW (ref >60)
Glucose, Bld: 161 mg/dL — ABNORMAL HIGH (ref 70–99)
Potassium: 4 mmol/L (ref 3.5–5.1)
Sodium: 135 mmol/L (ref 135–145)
Total Bilirubin: 0.3 mg/dL (ref 0.3–1.2)
Total Protein: 6.5 g/dL (ref 6.5–8.1)

## 2020-06-16 LAB — SODIUM, URINE, RANDOM: Sodium, Ur: 45 mmol/L

## 2020-06-16 MED ORDER — INSULIN GLARGINE 100 UNIT/ML ~~LOC~~ SOLN
10.0000 [IU] | SUBCUTANEOUS | Status: DC
  Administered 2020-06-16: 10 [IU] via SUBCUTANEOUS
  Filled 2020-06-16 (×2): qty 0.1

## 2020-06-16 MED ORDER — INSULIN GLARGINE 100 UNIT/ML ~~LOC~~ SOLN
20.0000 [IU] | SUBCUTANEOUS | Status: DC
  Administered 2020-06-16 – 2020-06-17 (×2): 20 [IU] via SUBCUTANEOUS
  Filled 2020-06-16 (×3): qty 0.2

## 2020-06-16 MED ORDER — SODIUM CHLORIDE 0.9 % IV SOLN
200.0000 mg | Freq: Once | INTRAVENOUS | Status: AC
  Administered 2020-06-16: 200 mg via INTRAVENOUS
  Filled 2020-06-16: qty 40

## 2020-06-16 MED ORDER — INSULIN GLARGINE 100 UNIT/ML ~~LOC~~ SOLN
15.0000 [IU] | SUBCUTANEOUS | Status: DC
  Filled 2020-06-16: qty 0.15

## 2020-06-16 MED ORDER — INSULIN ASPART 100 UNIT/ML ~~LOC~~ SOLN
0.0000 [IU] | SUBCUTANEOUS | Status: DC
  Administered 2020-06-16 (×2): 3 [IU] via SUBCUTANEOUS
  Administered 2020-06-16: 11 [IU] via SUBCUTANEOUS
  Administered 2020-06-16: 5 [IU] via SUBCUTANEOUS
  Administered 2020-06-16: 3 [IU] via SUBCUTANEOUS
  Administered 2020-06-17: 2 [IU] via SUBCUTANEOUS
  Administered 2020-06-17: 8 [IU] via SUBCUTANEOUS
  Administered 2020-06-17: 3 [IU] via SUBCUTANEOUS
  Administered 2020-06-17: 5 [IU] via SUBCUTANEOUS

## 2020-06-16 MED ORDER — SODIUM CHLORIDE 0.9 % IV SOLN
100.0000 mg | Freq: Every day | INTRAVENOUS | Status: DC
  Administered 2020-06-17 – 2020-06-18 (×2): 100 mg via INTRAVENOUS
  Filled 2020-06-16 (×2): qty 20

## 2020-06-16 MED ORDER — AMLODIPINE BESYLATE 10 MG PO TABS
10.0000 mg | ORAL_TABLET | Freq: Every day | ORAL | Status: DC
  Administered 2020-06-16 – 2020-06-18 (×3): 10 mg via ORAL
  Filled 2020-06-16 (×3): qty 1

## 2020-06-16 MED ORDER — LIVING WELL WITH DIABETES BOOK
Freq: Once | Status: AC
  Filled 2020-06-16: qty 1

## 2020-06-16 MED ORDER — APIXABAN 5 MG PO TABS
5.0000 mg | ORAL_TABLET | Freq: Two times a day (BID) | ORAL | Status: DC
  Administered 2020-06-16 – 2020-06-18 (×5): 5 mg via ORAL
  Filled 2020-06-16 (×5): qty 1

## 2020-06-16 NOTE — Progress Notes (Signed)
MD notified pts temp 101.1, pt has already received tylenol @ 1608 for foot pain. No new orders at this time oncoming RN made aware.

## 2020-06-16 NOTE — Progress Notes (Addendum)
Inpatient Diabetes Program Recommendations  AACE/ADA: New Consensus Statement on Inpatient Glycemic Control (2015)  Target Ranges:  Prepandial:   less than 140 mg/dL      Peak postprandial:   less than 180 mg/dL (1-2 hours)      Critically ill patients:  140 - 180 mg/dL   Lab Results  Component Value Date   GLUCAP 180 (H) 06/16/2020   HGBA1C 10.3 (A) 05/15/2020    Review of Glycemic Control Results for Jennifer [name], Jennifer [name] (MRN [id number]) as of 06/16/2020 13:48  Ref. Range 06/16/2020 02:27 06/16/2020 03:32 06/16/2020 04:13 06/16/2020 07:54 06/16/2020 11:56  Glucose-Capillary Latest Ref Range: 70 - 99 mg/dL 115 (H) 140 (H) 152 (H) 171 (H) 180 (H)   Diabetes history: DM2  Outpatient Diabetes medications:  70/30 30 mg qam and 15 units qpm  Current orders for Inpatient glycemic control: Novolog 0-15 units Q4H, Lantus 10 units Q24H  Inpatient Diabetes Program Recommendations:    Please consider,  Novolog 0-15 units TID and 0-5 units QHS   Spoke with patient and daughter on phone.  Reviewed patient's current A1c of 10.3%. Explained what a A1c is and what it measures. Also reviewed goal A1c with patient, importance of good glucose control @ home, and blood sugar goals.  She has been using 70/30 for about 2 months.  She states she would like to go back to the [employer],  She states she does not feel her blood sugars are as well managed with the 70/30.  She is current with [facility] and has Medicaid.  She states she checks her blood sugar 2 x a day.  She is aware of hypoglycemia, signs, symptoms and treatments.  Ordered LWWD booklet for review.    Will continue to follow while inpatient.  Thank you, Reche Dixon, RN, BSN Diabetes Coordinator Inpatient Diabetes Program [phone] (team pager from 8a-5p)

## 2020-06-16 NOTE — Progress Notes (Signed)
HD#1 Subjective:  Overnight Events: None   Patient is seen at bedside. Her daughter is helping with translation. Patient reports feeling ok, endorses burning pain in his left foot. Endorses lots of coughing but breathing is well.   Daughter states that her vision isback to normal.   Patient denies back pain. Endorses burning sensation with urination.    Objective:  Vital signs in last 24 hours: Vitals:   06/15/20 2130 06/16/20 0000 06/16/20 0126 06/16/20 0400  BP:  (!) 145/88 132/81 (!) 145/67  Pulse:  80 77 78  Resp:  14 (!) 22 18  Temp:   99 F (37.2 C) 98 F (36.7 C)  TempSrc:   Oral Axillary  SpO2:  98% 96% 91%  Weight: 82 kg     Height: 5\' 7"  (1.702 m)      Supplemental O2: Room Air SpO2: 91 %   Physical Exam:  Constitutional: sitting up in bed, well nourished, no acute distress HENT: normocephalic atraumatic, Eyes: conjunctiva non-erythematous, left cornea hazy, EMOI Cardiovascular: regular rate and rhythm, no m/r/g; no edema Pulmonary/Chest: normal work of breathing on room air, lungs clear to auscultation bilaterally; no wheezes, rales, or rhonchi Abdominal: soft, non-tender, non-distended MSK: normal bulk and tone;  Neurological: alert & oriented x3, no focal deficits  Skin: warm and dry Psych: normal affect, normal mood  Filed Weights   06/15/20 2130  Weight: 82 kg    No intake or output data in the 24 hours ending 06/16/20 0644 Net IO Since Admission: No IO data has been entered for this period [06/16/20 0644]  Pertinent Labs: CBC Latest Ref Rng & Units 06/16/2020 06/15/2020 06/15/2020  WBC 4.0 - 10.5 K/uL 4.2 - 5.5  Hemoglobin 12.0 - 15.0 g/dL 10.7(L) 13.9 12.3  Hematocrit 36.0 - 46.0 % 33.1(L) 41.0 38.7  Platelets 150 - 400 K/uL 121(L) - 174    CMP Latest Ref Rng & Units 06/16/2020 06/15/2020 06/15/2020  Glucose 70 - 99 mg/dL 161(H) 375(H) -  BUN 6 - 20 mg/dL 23(H) 26(H) -  Creatinine 0.44 - 1.00 mg/dL 1.24(H) 1.27(H) -  Sodium 135 - 145  mmol/L 135 131(L) 129(L)  Potassium 3.5 - 5.1 mmol/L 4.0 4.6 5.5(H)  Chloride 98 - 111 mmol/L 103 98 -  CO2 22 - 32 mmol/L 23 24 -  Calcium 8.9 - 10.3 mg/dL 8.8(L) 9.1 -  Total Protein 6.5 - 8.1 g/dL 6.5 - -  Total Bilirubin 0.3 - 1.2 mg/dL 0.3 - -  Alkaline Phos 38 - 126 U/L 89 - -  AST 15 - 41 U/L 19 - -  ALT 0 - 44 U/L 11 - -    Imaging: No results found.  Assessment/Plan:   Active Problems:   AKI (acute kidney injury) ([place])   Hyperglycemia   COVID-19 virus infection   Patient Summary: Jennifer [name] is a 50 year old woman with past medical history of uncontrolled insulin-dependent type 2 diabetes mellitus, atrial fibrillation, hypertension, hyperlipidemia, iron deficiency anemia who presented to the ED from clinic with 2-day history of fatigue, myalgias, and decreased intake and admitted for hyperosmolar hyperglycemic state (HHS) and AKI in the setting of COVID-19 infection.  Hyperosmolar hyperglycemic state (HHS) Insulin-dependent type II diabetes mellitus Admitted HHS triggered in setting of COVID-19 infection and reported history of dietary indiscretion. Reports adherence to home insulin regimen of Novolog 70/30 30 units in the AM and 15 units at dinner. Last Hgb A1c 10.1 on 05/15/20. Anion gap closed, transitioned off insulin gtt. -  Lantus 10 units daily - SSI - Hemoglobin A1c   COVID-19 infection Patient with 2-day history of fatigue, dizziness, nausea, myalgias, mild headache. Feeling a bit better. Comfortable on room air. Lungs CTA. Received Moderna COVID vaccination x 1 on 05/17/20. Given recent mild symoptoms will start on remdesivir to avoid progression to serious illness.  - Start remdesivir - Robitussin DM - Tylenol PRN - monitor O2  Pyuria Patient does endorse dysuria. Urinalysis elevated glucose, small hgb, protein, and large leukocytes and rate bacterial. Will order urine culture - Follow up urine culture  Acute Kidney Injury Cr improved to 1.2  this morning near  baseline ~1.1, likely in the setting of dehydration with from HHS. Improved with fluids back to baseline.  - monitor BMP  - Avoid nephrotoxic agents   Atrial Fibrillation HR stable between 70-80. Regular rate on exam this morning. Transition back to home anticoagulation. - Eliquis 5 mg twice daily  History of hypertension Previously stopped on amlodipine due to hypotension suspected due to autonomic dysfunction from uncontrolled diabetes. Pressure of 132/57 this morning.  - amlodipine 10 mg daily   HFpEF:  Last ECHO on 03/16/20 showed LVEF of 60-65%. on Lasix 20 mg PRN. No signs of fluid overload - Hold Lasix in setting of recent HHS and AKI  Depressed mood Patient's concerned her mother has been having depressed mood, anhedonia, hypersomnia, and low energy after brother passed and worsened in setting of chronic illness. Statespatient has previously tried an antidepressant however cannot recall effect. Has not had therapy. Duaghter is concerned this may contribute to patient's difficulty with adherence to medications - consider referral to behavioral health at discharge - consider initiation of SSRI or other therapy once recovered from this illness  Diet: Carb modified VTE: Eliquis IVF: None Code: Full  Dispo: Anticipated discharge to Home in 1-2 days.  Iona Beard, MD 06/16/2020, 6:44 AM Pager: [phone]  Please contact the on call pager after 5 pm and on weekends at [phone].

## 2020-06-17 LAB — C-REACTIVE PROTEIN: CRP: 5.2 mg/dL — ABNORMAL HIGH (ref <1.0)

## 2020-06-17 LAB — BASIC METABOLIC PANEL
Anion gap: 10 (ref 5–15)
BUN: 18 mg/dL (ref 6–20)
CO2: 23 mmol/L (ref 22–32)
Calcium: 8.9 mg/dL (ref 8.9–10.3)
Chloride: 104 mmol/L (ref 98–111)
Creatinine, Ser: 1.09 mg/dL — ABNORMAL HIGH (ref 0.44–1.00)
GFR, Estimated: >60 mL/min (ref >60)
Glucose, Bld: 113 mg/dL — ABNORMAL HIGH (ref 70–99)
Potassium: 3.9 mmol/L (ref 3.5–5.1)
Sodium: 137 mmol/L (ref 135–145)

## 2020-06-17 LAB — GLUCOSE, CAPILLARY
Glucose-Capillary: 178 mg/dL — ABNORMAL HIGH (ref 70–99)
Glucose-Capillary: 216 mg/dL — ABNORMAL HIGH (ref 70–99)
Glucose-Capillary: 267 mg/dL — ABNORMAL HIGH (ref 70–99)
Glucose-Capillary: 87 mg/dL (ref 70–99)
Glucose-Capillary: 96 mg/dL (ref 70–99)
Glucose-Capillary: 97 mg/dL (ref 70–99)

## 2020-06-17 LAB — D-DIMER, QUANTITATIVE: D-Dimer, Quant: 1.56 ug/mL-FEU — ABNORMAL HIGH (ref 0.00–0.50)

## 2020-06-17 LAB — CBC
HCT: 34 % — ABNORMAL LOW (ref 36.0–46.0)
Hemoglobin: 11 g/dL — ABNORMAL LOW (ref 12.0–15.0)
MCH: 26.4 pg (ref 26.0–34.0)
MCHC: 32.4 g/dL (ref 30.0–36.0)
MCV: 81.7 fL (ref 80.0–100.0)
Platelets: 121 10*3/uL — ABNORMAL LOW (ref 150–400)
RBC: 4.16 MIL/uL (ref 3.87–5.11)
RDW: 14.8 % (ref 11.5–15.5)
WBC: 4.8 10*3/uL (ref 4.0–10.5)
nRBC: 0 % (ref 0.0–0.2)

## 2020-06-17 LAB — FERRITIN: Ferritin: 288 ng/mL (ref 11–307)

## 2020-06-17 MED ORDER — DICLOFENAC SODIUM 1 % EX GEL
2.0000 g | Freq: Four times a day (QID) | CUTANEOUS | Status: DC
  Administered 2020-06-17 – 2020-06-18 (×4): 2 g via TOPICAL
  Filled 2020-06-17: qty 100

## 2020-06-17 NOTE — Progress Notes (Signed)
HD#2 Subjective:  Overnight Events: None  Patient is seen at bedside. Her daughter is helping with translation. She states she is feeling better today. She had foot pain overnight. Still having some coughing. She broke her right ankle in the past. She has an appt on Tuesday regarding this. She states the foot pain is worse on the left however. The pain is worse when she is sitting. Walking around does not really help her pain. Massaging it helps alleviate the pain. Her feet feel cold.    Objective:  Vital signs in last 24 hours: Vitals:   06/16/20 1849 06/16/20 1928 06/16/20 2315 06/17/20 0347  BP:  124/65 (!) 132/58 (!) 149/65  Pulse:  80 82 84  Resp:  19 15 20   Temp: (!) 101.1 F (38.4 C) 100 F (37.8 C) 99.5 F (37.5 C) 99.6 F (37.6 C)  TempSrc: Oral Oral Oral Oral  SpO2:  94% 96% 95%  Weight:      Height:       Supplemental O2: Room Air SpO2: 95 %   Physical Exam:  Constitutional: sitting up in bed, well nourished, no acute distress HENT: normocephalic atraumatic, Eyes: conjunctiva non-erythematous, left cornea hazy, EMOI Cardiovascular: regular rate and rhythm, no m/r/g; no edema Pulmonary/Chest: normal work of breathing on room air, lungs clear to auscultation bilaterally; no wheezes, rales, or rhonchi Abdominal: soft, non-tender, non-distended MSK: normal bulk and tone; normal dp and PT pulses no edema of the left foot, no ulceration or signs of infection. Neurological: alert & oriented x3, no focal deficits, grossly intact sensation of the left foot.  Skin: warm and dry Psych: normal affect, normal mood  Filed Weights   06/15/20 2130  Weight: 82 kg     Intake/Output Summary (Last 24 hours) at 06/17/2020 0610 Last data filed at 06/16/2020 1603 Gross per 24 hour  Intake 580 ml  Output --  Net 580 ml   Net IO Since Admission: 580 mL [06/17/20 0610]  Pertinent Labs: CBC Latest Ref Rng & Units 06/17/2020 06/16/2020 06/15/2020  WBC 4.0 - 10.5 K/uL 4.8 4.2  -  Hemoglobin 12.0 - 15.0 g/dL 11.0(L) 10.7(L) 13.9  Hematocrit 36.0 - 46.0 % 34.0(L) 33.1(L) 41.0  Platelets 150 - 400 K/uL 121(L) 121(L) -    CMP Latest Ref Rng & Units 06/17/2020 06/16/2020 06/16/2020  Glucose 70 - 99 mg/dL 113(H) 325(H) 199(H)  BUN 6 - 20 mg/dL 18 19 19   Creatinine 0.44 - 1.00 mg/dL 1.09(H) 1.24(H) 1.20(H)  Sodium 135 - 145 mmol/L 137 134(L) 136  Potassium 3.5 - 5.1 mmol/L 3.9 4.4 4.8  Chloride 98 - 111 mmol/L 104 101 101  CO2 22 - 32 mmol/L 23 23 25   Calcium 8.9 - 10.3 mg/dL 8.9 8.6(L) 8.8(L)  Total Protein 6.5 - 8.1 g/dL - - -  Total Bilirubin 0.3 - 1.2 mg/dL - - -  Alkaline Phos 38 - 126 U/L - - -  AST 15 - 41 U/L - - -  ALT 0 - 44 U/L - - -   Imaging: No results found.  Assessment/Plan:   Principal Problem:   COVID-19 virus infection Active Problems:   Type II diabetes mellitus ([place])   Hypertension associated with diabetes ([place])   Atrial fibrillation ([place])   AKI (acute kidney injury) ([place])   Hyperglycemia   Patient Summary: [name] is a 50 year old woman with past medical history of uncontrolled insulin-dependent type 2 diabetes mellitus, atrial fibrillation, hypertension, hyperlipidemia, iron deficiency anemia who presented  to the ED from clinic with 2-day history of fatigue, myalgias, and decreased intake and admitted for hyperosmolar hyperglycemic state (HHS) and AKI in the setting of COVID-19 infection.  Hyperosmolar hyperglycemic state (HHS)-resolved Insulin-dependent type II diabetes mellitus Admitted HHS triggered in setting of COVID-19 infection and reported history of dietary indiscretion. Reports adherence to home insulin regimen of Novolog 70/30 30 units in the AM and 15 units at dinner. A1c 11.6 from 10.3 on 05/15/20. Increased lantus overnight due to elevated CBG of 390. CGB today in 90s this morning. Plan to continue on home regimen on discharge. Family expression continue difficulty managing patient's diabetes and confusion  about what to eat. Would benefit from continued diabetic education, consider setting home diabetic educator as an outpatient if possible. - Lantus 20 units daily - SSI  COVID-19 infection Patient presenting fatigue, dizziness, nausea, myalgias, mild headache. Feeling a bit better with less cough. Febrile yesterday afternoon to 101.1. Comfortable on room air. Lungs CTA. Received Moderna COVID vaccination x 1 on 05/17/20. Continue on remdesivir for total of 3 days.  - Continue with remdesivir - Robitussin DM - Tylenol PRN - monitor O2  Pyuria Endorsing some dysuria. Urinalysis elevated glucose, small hgb, protein, and large leukocytes and rare bacterial. Has grown multiple organisms with resistance to ampicillin and Macrobid - Follow up urine culture  Acute Kidney Injury - resolved Cr improved to 1.09, now at baseline. AKI in the setting of dehydration with from HHS. Improved with fluids back to baseline.  - monitor BMP   History of hypertension BP remain in 130s over 50s  - amlodipine 10 mg daily   Left foot pain Patient reports left heel pain describes as stabbing. A little better with tylenol and massage. States pain is all time no worse with walking or laying. Sensation grossly intact. No wounds or deformity. Recommend outpatient diabetic foot exam vs plantar fascitis.   HFpEF:  Last ECHO on 03/16/20 showed LVEF of 60-65%. on Lasix 20 mg PRN. No signs of fluid overload. Resume at discharge  Depressed mood Patient's concerned her mother has been having depressed mood, anhedonia, hypersomnia, and low energy after brother passed and worsened in setting of chronic illness. Statespatient has previously tried an antidepressant however cannot recall effect. Has not had therapy. Duaghter is concerned this may contribute to patient's difficulty with adherence to medications - consider referral to behavioral health at discharge - consider initiation of SSRI or other therapy once recovered  from this illness  Diet: Carb modified VTE: Eliquis IVF: None Code: Full  Dispo: Anticipated discharge to Home in 1 days.  Iona Beard, MD 06/17/2020, 6:10 AM Pager: [phone]  Please contact the on call pager after 5 pm and on weekends at [phone].

## 2020-06-18 LAB — CBC
HCT: 33.6 % — ABNORMAL LOW (ref 36.0–46.0)
Hemoglobin: 10.3 g/dL — ABNORMAL LOW (ref 12.0–15.0)
MCH: 25.4 pg — ABNORMAL LOW (ref 26.0–34.0)
MCHC: 30.7 g/dL (ref 30.0–36.0)
MCV: 83 fL (ref 80.0–100.0)
Platelets: 111 10*3/uL — ABNORMAL LOW (ref 150–400)
RBC: 4.05 MIL/uL (ref 3.87–5.11)
RDW: 15.1 % (ref 11.5–15.5)
WBC: 5.1 10*3/uL (ref 4.0–10.5)
nRBC: 0 % (ref 0.0–0.2)

## 2020-06-18 LAB — BASIC METABOLIC PANEL
Anion gap: 10 (ref 5–15)
BUN: 21 mg/dL — ABNORMAL HIGH (ref 6–20)
CO2: 24 mmol/L (ref 22–32)
Calcium: 8.8 mg/dL — ABNORMAL LOW (ref 8.9–10.3)
Chloride: 104 mmol/L (ref 98–111)
Creatinine, Ser: 1.09 mg/dL — ABNORMAL HIGH (ref 0.44–1.00)
GFR, Estimated: >60 mL/min (ref >60)
Glucose, Bld: 111 mg/dL — ABNORMAL HIGH (ref 70–99)
Potassium: 4.1 mmol/L (ref 3.5–5.1)
Sodium: 138 mmol/L (ref 135–145)

## 2020-06-18 LAB — GLUCOSE, CAPILLARY
Glucose-Capillary: 100 mg/dL — ABNORMAL HIGH (ref 70–99)
Glucose-Capillary: 105 mg/dL — ABNORMAL HIGH (ref 70–99)
Glucose-Capillary: 189 mg/dL — ABNORMAL HIGH (ref 70–99)

## 2020-06-18 MED ORDER — ONDANSETRON HCL 4 MG/2ML IJ SOLN: 4.0000 mg | Freq: Four times a day (QID) | INTRAMUSCULAR | Status: DC | PRN

## 2020-06-18 MED ORDER — DICLOFENAC SODIUM 1 % EX GEL: 2.0000 g | Freq: Four times a day (QID) | CUTANEOUS | 0 refills | Status: DC

## 2020-06-18 NOTE — Progress Notes (Deleted)
Name: Jennifer [name] MRN: [id number] DOB: 11/18/1970 50 y.o. PCP: Jennifer Persia, MD  Date of Admission: 06/15/2020  3:24 PM Date of Discharge: 06/18/2020  Attending Physician: No att. providers found  Discharge Diagnosis: 1. HHS 2. Insulin dependent type II DM 3. Covid 19  4. Pyuria 5. AKI 6. Left foot pain 7. Depressed mood  Discharge Medications: Allergies as of 06/18/2020   No Known Allergies     Medication List    TAKE these medications   Accu-Chek FastClix Lancets Misc Check blood sugar 4 times a day   Accu-Chek Guide test strip Generic drug: glucose blood Check blood sugar 4 times per day   Accu-Chek Guide w/Device Kit 1 each by Does not apply route 4 (four) times daily.   acetaminophen 500 MG tablet Commonly known as: TYLENOL Take 500 mg by mouth every 6 (six) hours as needed for mild pain.   B-D UF III MINI PEN NEEDLES 31G X 5 MM Misc Generic drug: Insulin Pen Needle Use pen needle daily for injections   diclofenac Sodium 1 % Gel Commonly known as: VOLTAREN Apply 2 g topically 4 (four) times daily.   ferrous sulfate 325 (65 FE) MG tablet Take 1 tablet (325 mg total) by mouth every Monday, Wednesday, and Friday for 14 days.   insulin aspart protamine- aspart (70-30) 100 UNIT/ML injection Commonly known as: NOVOLOG MIX 70/30 Inject 15-30 Units into the skin See admin instructions. Inject 30 units in the morning and then inject 15 units at supper per daughter       Disposition and follow-up:   Ms.[name] was discharged from [facility] in Stable condition.  At the hospital follow up visit please address:  1.  Type II DM-patient continues to have uncontrolled diabetes despite numerous admissions, clinic visits and education.  At discharge [facility] was consulted to help coordinate diabetes educator or RN to come out to the patient's home to help manage her diabetes.  Please see if there are any resources on the clinic that can  help.  Left ankle fracture-patient reports a ground-level fall in her kitchen that is resulted in a left ankle fracture.  There was discussion of whether or not this was a pathologic fracture and if the patient would qualify for a DEXA scan.  Depression-see details below.  Consider referral to behavioral health and initiation of SSRI.  Left foot pain-recommend outpatient diabetic foot exam.  Somewhat improved on Voltaren gel.  2.  Labs / imaging needed at time of follow-up: none  3.  Pending labs/ test needing follow-up: none  Follow-up Appointments:  Jennifer Persia, MD Follow up in 1-2 weeks  Hospital Course by problem list: 1. HHS-patient presented with a glucose of 624 associated with elevated serum osmolality, acidosis consistent with HHS.  She was placed on Endo tool for hypoglycemic control overnight.  Suspect that HHS was triggered in the setting of COVID-19 infection and reported history of dietary indiscretion.  Patient's home regimen includes NovoLog 70/30 30 units in the a.m. and 15 units at dinner.  She was treated with 20 units Lantus and sliding scale during hospital admission once transitioned off the Endo tool.  Hemoglobin A1c 11.6.  Discharged home on home regimen of insulin.  Family expres continued difficulty managing patient's diabetes and confusion about medication regimen. Would benefit from continued diabetic education, consider setting home diabetic educator as an outpatient if possible.  2. Insulin dependent type II DM-as above.  3. Covid 19 -patient had mild  symptoms on admission.  Febrile to 101.1.  Received maternal Covid vaccination on 05/17/2020.  Was treated with remdesivir for total of 3 days.  Patient needs her second vaccination dose.  4. Pyuria- Endorsed some dysuria. Urinalysis elevated glucose, small hgb, protein, and large leukocytes and rare bacterial. Has grown multiple organisms with resistance to ampicillin and Macrobid.  Urine culture showed no growth  to date.  5. AKI-in the setting of dehydration from HHS.  Improved with fluids back to baseline.  6. Left foot pain- Patient reports left heel pain describes as stabbing. A little better with tylenol, bartender in general and massage. States pain is all time no worse with walking or laying. Sensation grossly intact. No wounds or deformity. Recommend outpatient diabetic foot exam vs plantar fascitis work-up.   Discharged home with Voltaren gel.  7. Depressed mood-Patient's concerned her mother has been having depressed mood, anhedonia, hypersomnia, and low energy after brother passed and worsened in setting of chronic illness. States patient has previously tried an antidepressant however cannot recall effect. Has not had therapy. Duaghter is concerned this may contribute to patient's difficulty with adherence to medications.  Consider referral to behavioral health and initiation of SSRI.  Subjective:   Patient is seen at bedside. Her daughter states that patient is feeling well. States that her foot pain is also better with Voltaren gel.  States that the side of her foot still feel heavy. States that when she broke her ankle, she fell in the kitchen at ground level. Denies tripping over anything.   Discharge Exam:   BP 125/68   Pulse 81   Temp 98.2 F (36.8 C)   Resp 12   Ht '5\' 7"'  (1.702 m)   Wt 82 kg   LMP 08/21/2016 (Exact Date)   SpO2 92%   BMI 28.31 kg/m  Discharge exam:  Physical Exam Vitals reviewed.  Constitutional:      General: She is not in acute distress.    Appearance: Normal appearance. She is not ill-appearing.  HENT:     Head: Normocephalic and atraumatic.  Cardiovascular:     Rate and Rhythm: Normal rate and regular rhythm.     Pulses: Normal pulses.     Heart sounds: Normal heart sounds. No murmur heard. No friction rub. No gallop.   Pulmonary:     Effort: Pulmonary effort is normal. No respiratory distress.     Breath sounds: Normal breath sounds. No wheezing  or rales.  Abdominal:     General: Abdomen is flat. Bowel sounds are normal. There is no distension.     Palpations: Abdomen is soft.     Tenderness: There is no abdominal tenderness. There is no guarding.  Musculoskeletal:        General: No swelling or tenderness.     Right lower leg: No edema.     Left lower leg: No edema.  Skin:    General: Skin is warm and dry.  Neurological:     Mental Status: She is alert and oriented to person, place, and time.  Psychiatric:        Mood and Affect: Mood normal.        Behavior: Behavior normal.     Pertinent Labs, Studies, and Procedures:   CBC Latest Ref Rng & Units 06/18/2020 06/17/2020 06/16/2020  WBC 4.0 - 10.5 K/uL 5.1 4.8 4.2  Hemoglobin 12.0 - 15.0 g/dL 10.3(L) 11.0(L) 10.7(L)  Hematocrit 36.0 - 46.0 % 33.6(L) 34.0(L) 33.1(L)  Platelets 150 - 400  K/uL 111(L) 121(L) 121(L)   BMP Latest Ref Rng & Units 06/18/2020 06/17/2020 06/16/2020  Glucose 70 - 99 mg/dL 111(H) 113(H) 325(H)  BUN 6 - 20 mg/dL 21(H) 18 19  Creatinine 0.44 - 1.00 mg/dL 1.09(H) 1.09(H) 1.24(H)  BUN/Creat Ratio 9 - 23 - - -  Sodium 135 - 145 mmol/L 138 137 134(L)  Potassium 3.5 - 5.1 mmol/L 4.1 3.9 4.4  Chloride 98 - 111 mmol/L 104 104 101  CO2 22 - 32 mmol/L '24 23 23  ' Calcium 8.9 - 10.3 mg/dL 8.8(L) 8.9 8.6(L)    Discharge Instructions: Discharge Instructions    Call MD for:  difficulty breathing, headache or visual disturbances   Complete by: As directed    Call MD for:  persistant dizziness or light-headedness   Complete by: As directed    Call MD for:  persistant nausea and vomiting   Complete by: As directed    Diet - low sodium heart healthy   Complete by: As directed    Discharge instructions   Complete by: As directed    Ms. Riviere,  You were hospitalized due to elevated blood sugars.  You are also found to be COVID-19 positive.  You were treated with high doses of insulin for your blood sugars.  You are also treated with antivirals for 3 days for  your Covid infection.  It is very important that you continue to take your insulin as prescribed.  Continue to inject 30 units in the morning and 15 units additionally at nighttime at dinner.  Please follow-up with your PCP in 1 week.   Increase activity slowly   Complete by: As directed       Signed: Jenin Birdsall N, DO 06/18/2020, 2:23 PM

## 2020-06-18 NOTE — Care Management (Signed)
Unable to secure [facility] RN for diabetes education. Informed resident that a referral is needed by an MD, but patient can be referred to [facility] Outpatient Diabetes Education, and provided number to call.

## 2020-06-18 NOTE — Discharge Summary (Addendum)
Name: Jennifer [name] MRN: [id number] DOB: [birth year] 50 y.o. PCP: Jose Persia, MD   Date of Admission: 06/15/2020  3:24 PM Date of Discharge: 06/18/2020            Attending Physician: Lenice Pressman, MD, PhD    Discharge Diagnosis: 1. HHS 2. Insulin dependent type II DM 3. Covid 19  4. Pyuria 5. AKI 6. Left foot pain 7. Depressed mood   Discharge Medications: Allergies as of 06/18/2020   No Known Allergies          Medication List     TAKE these medications   Accu-Chek FastClix Lancets Misc Check blood sugar 4 times a day    Accu-Chek Guide test strip Generic drug: glucose blood Check blood sugar 4 times per day    Accu-Chek Guide w/Device Kit 1 each by Does not apply route 4 (four) times daily.    acetaminophen 500 MG tablet Commonly known as: TYLENOL Take 500 mg by mouth every 6 (six) hours as needed for mild pain.    B-D UF III MINI PEN NEEDLES 31G X 5 MM Misc Generic drug: Insulin Pen Needle Use pen needle daily for injections    diclofenac Sodium 1 % Gel Commonly known as: VOLTAREN Apply 2 g topically 4 (four) times daily.    ferrous sulfate 325 (65 FE) MG tablet Take 1 tablet (325 mg total) by mouth every Monday, Wednesday, and Friday for 14 days.    insulin aspart protamine- aspart (70-30) 100 UNIT/ML injection Commonly known as: NOVOLOG MIX 70/30 Inject 15-30 Units into the skin See admin instructions. Inject 30 units in the morning and then inject 15 units at supper per daughter           Disposition and follow-up:   [name] was discharged from [facility] in Stable condition.  At the hospital follow up visit please address:   1.  Type II DM-patient continues to have uncontrolled diabetes despite numerous admissions, clinic visits and education.  At discharge [facility] was consulted to help coordinate diabetes educator or RN to come out to the patient's home to help manage her diabetes.  Please see if there are any  resources on the clinic that can help.   Left ankle fracture-patient reports a ground-level fall in her kitchen that is resulted in a left ankle fracture.  There was discussion of whether or not this was a fragility fracture and if the patient would qualify for a DEXA scan.   Depression-see details below.  Consider referral to behavioral health and initiation of SSRI.   Left foot pain-recommend outpatient diabetic foot exam.  Somewhat improved on Voltaren gel.   2.  Labs / imaging needed at time of follow-up: none   3.  Pending labs/ test needing follow-up: none   Follow-up Appointments:  Jose Persia, MD Follow up in 1-2 weeks   Hospital Course by problem list: 1. HHS-patient presented with a glucose of 624 associated with elevated serum osmolality, no acidosis, consistent with HHS.  She was placed on IV insulin drip using Endo tool for hypoglycemic control overnight.  Suspect that HHS was triggered in the setting of COVID-19 infection and reported history of dietary indiscretion.  Patient's home regimen includes NovoLog 70/30 30 units in the a.m. and 15 units at dinner.  She was treated with 20 units Lantus and sliding scale during hospital admission once transitioned off the Endo tool.  Hemoglobin A1c 11.6.  Discharged home on home regimen of  insulin.  Family expressed continued difficulty managing patient's diabetes and confusion about medication regimen. Would benefit from continued diabetic education, consider setting home diabetic educator as an outpatient if possible.   2. Insulin dependent type II DM-as above.   3. Covid 19 -patient had mild symptoms on admission.  Febrile to 101.1.  Received Covid vaccination on 05/17/2020.  Was treated with remdesivir for total of 3 days.  Patient needs her second vaccination dose.   4. Pyuria- Endorsed some dysuria. Urinalysis elevated glucose, small hgb, protein, and large leukocytes and rare bacteria. Has grown multiple organisms with  resistance to ampicillin and Macrobid in the past.  Urine culture showed no growth to date.  She was not treated with any antibiotics this admission and dysuria resolved spontaneously.   5. AKI-in the setting of dehydration from HHS.  Improved with fluids back to baseline.   6. Left foot pain- Patient reports left heel pain describes as stabbing. A little better with tylenol and massage. States pain is all the time no worse with walking or laying. Sensation grossly intact. No wounds or deformity. Recommend outpatient diabetic foot exam vs plantar fascitis work-up.   Discharged home with Voltaren gel.   7. Depressed mood-Patient's daughter concerned her mother has been having depressed mood, anhedonia, hypersomnia, and low energy after brother passed and worsened in setting of chronic illness. States patient has previously tried an antidepressant however cannot recall effect. Has not had therapy. Duaghter is concerned this may contribute to patient's difficulty with adherence to medications.  Consider referral to behavioral health and initiation of SSRI.   Subjective:    Patient is seen at bedside. Her daughter states that patient is feeling well. States that her foot pain is also better with Voltaren gel.  States that the side of her foot still feel heavy. States that when she broke her ankle, she fell in the kitchen at ground level. Denies tripping over anything.    Discharge Exam:   BP 125/68    Pulse 81    Temp 98.2 F (36.8 C)    Resp 12    Ht '5\' 7"'  (1.702 m)    Wt 82 kg    LMP 08/21/2016 (Exact Date)    SpO2 92%    BMI 28.31 kg/m  Discharge exam:  Physical Exam Vitals reviewed.  Constitutional:      General: She is not in acute distress.    Appearance: Normal appearance. She is not ill-appearing.  HENT:     Head: Normocephalic and atraumatic.  Cardiovascular:     Rate and Rhythm: Normal rate and regular rhythm.     Pulses: Normal pulses.     Heart sounds: Normal heart sounds. No  murmur heard. No friction rub. No gallop.   Pulmonary:     Effort: Pulmonary effort is normal. No respiratory distress.     Breath sounds: Normal breath sounds. No wheezing or rales.  Abdominal:     General: Abdomen is flat. Bowel sounds are normal. There is no distension.     Palpations: Abdomen is soft.     Tenderness: There is no abdominal tenderness. There is no guarding.  Musculoskeletal:        General: No swelling or tenderness.     Right lower leg: No edema.     Left lower leg: No edema.  Skin:    General: Skin is warm and dry.  Neurological:     Mental Status: She is alert and oriented to person, place, and  time.  Psychiatric:        Mood and Affect: Mood normal.        Behavior: Behavior normal.        Pertinent Labs, Studies, and Procedures:    CBC Latest Ref Rng & Units 06/18/2020 06/17/2020 06/16/2020  WBC 4.0 - 10.5 K/uL 5.1 4.8 4.2  Hemoglobin 12.0 - 15.0 g/dL 10.3(L) 11.0(L) 10.7(L)  Hematocrit 36.0 - 46.0 % 33.6(L) 34.0(L) 33.1(L)  Platelets 150 - 400 K/uL 111(L) 121(L) 121(L)    BMP Latest Ref Rng & Units 06/18/2020 06/17/2020 06/16/2020  Glucose 70 - 99 mg/dL 111(H) 113(H) 325(H)  BUN 6 - 20 mg/dL 21(H) 18 19  Creatinine 0.44 - 1.00 mg/dL 1.09(H) 1.09(H) 1.24(H)  BUN/Creat Ratio 9 - 23 - - -  Sodium 135 - 145 mmol/L 138 137 134(L)  Potassium 3.5 - 5.1 mmol/L 4.1 3.9 4.4  Chloride 98 - 111 mmol/L 104 104 101  CO2 22 - 32 mmol/L '24 23 23  ' Calcium 8.9 - 10.3 mg/dL 8.8(L) 8.9 8.6(L)      Discharge Instructions:     Discharge Instructions     Call MD for:  difficulty breathing, headache or visual disturbances   Complete by: As directed      Call MD for:  persistant dizziness or light-headedness   Complete by: As directed      Call MD for:  persistant nausea and vomiting   Complete by: As directed      Diet - low sodium heart healthy   Complete by: As directed      Discharge instructions   Complete by: As directed      [name],   You were  hospitalized due to elevated blood sugars.  You are also found to be COVID-19 positive.  You were treated with high doses of insulin for your blood sugars.  You are also treated with antivirals for 3 days for your Covid infection.   It is very important that you continue to take your insulin as prescribed.  Continue to inject 30 units in the morning and 15 units additionally at nighttime at dinner.   Please follow-up with your PCP in 1 week.    Increase activity slowly   Complete by: As directed           Signed: Abdur Hoglund N, DO 06/18/2020, 2:23 PM

## 2020-06-18 NOTE — Progress Notes (Signed)
[name] to be D/C'd Home per MD order.  Discussed with the patient and all questions fully answered.  VSS, Skin clean, dry and intact without evidence of skin break down, no evidence of skin tears noted. IV catheter discontinued intact. Site without signs and symptoms of complications. Dressing and pressure applied.  An After Visit Summary was printed and given to the patient.   D/c education completed with patient/family including follow up instructions, medication list, d/c activities limitations if indicated, with other d/c instructions as indicated by MD - patient able to verbalize understanding, all questions fully answered.   Patient instructed to return to ED, call 911, or call MD for any changes in condition.   Patient escorted via [place], and D/C home via private auto.  Dene Gentry 06/18/2020 12:48 PM

## 2020-06-19 LAB — URINE CULTURE: Culture: >=100000 — AB

## 2020-06-20 ENCOUNTER — Encounter: Payer: Medicaid Other | Admitting: Student

## 2020-06-22 ENCOUNTER — Other Ambulatory Visit: Payer: Self-pay | Admitting: Internal Medicine

## 2020-06-22 DIAGNOSIS — E11319 Type 2 diabetes mellitus with unspecified diabetic retinopathy without macular edema: Secondary | ICD-10-CM

## 2020-06-23 ENCOUNTER — Other Ambulatory Visit: Payer: Medicaid Other

## 2020-06-23 ENCOUNTER — Other Ambulatory Visit: Payer: Self-pay

## 2020-06-23 ENCOUNTER — Telehealth: Payer: Self-pay

## 2020-06-23 ENCOUNTER — Encounter: Payer: Medicaid Other | Admitting: Student

## 2020-06-23 ENCOUNTER — Ambulatory Visit (INDEPENDENT_AMBULATORY_CARE_PROVIDER_SITE_OTHER): Payer: Medicaid Other | Admitting: Internal Medicine

## 2020-06-23 DIAGNOSIS — D649 Anemia, unspecified: Secondary | ICD-10-CM | POA: Diagnosis not present

## 2020-06-23 DIAGNOSIS — E11319 Type 2 diabetes mellitus with unspecified diabetic retinopathy without macular edema: Secondary | ICD-10-CM | POA: Diagnosis not present

## 2020-06-23 DIAGNOSIS — E782 Mixed hyperlipidemia: Secondary | ICD-10-CM

## 2020-06-23 DIAGNOSIS — Z794 Long term (current) use of insulin: Secondary | ICD-10-CM

## 2020-06-23 DIAGNOSIS — I480 Paroxysmal atrial fibrillation: Secondary | ICD-10-CM | POA: Diagnosis not present

## 2020-06-23 DIAGNOSIS — Z20822 Contact with and (suspected) exposure to covid-19: Secondary | ICD-10-CM

## 2020-06-23 DIAGNOSIS — D509 Iron deficiency anemia, unspecified: Secondary | ICD-10-CM | POA: Diagnosis not present

## 2020-06-23 MED ORDER — NOVOLOG MIX 70/30 FLEXPEN (70-30) 100 UNIT/ML ~~LOC~~ SUPN: 15.0000 [IU] | PEN_INJECTOR | Freq: Two times a day (BID) | SUBCUTANEOUS | 3 refills | Status: DC

## 2020-06-23 MED ORDER — FERROUS SULFATE 325 (65 FE) MG PO TABS: 325.0000 mg | ORAL_TABLET | ORAL | 3 refills | Status: DC

## 2020-06-23 MED ORDER — INSULIN ASPART PROT & ASPART (70-30 MIX) 100 UNIT/ML ~~LOC~~ SUSP: 15.0000 [IU] | SUBCUTANEOUS | 3 refills | Status: DC

## 2020-06-23 MED ORDER — METFORMIN HCL ER 500 MG PO TB24
500.0000 mg | ORAL_TABLET | Freq: Every day | ORAL | 3 refills | Status: DC
Start: 2020-06-23 — End: 2020-08-22

## 2020-06-23 MED ORDER — ATORVASTATIN CALCIUM 40 MG PO TABS: 40.0000 mg | ORAL_TABLET | Freq: Every day | ORAL | 3 refills | Status: DC

## 2020-06-23 MED ORDER — APIXABAN 5 MG PO TABS: 5.0000 mg | ORAL_TABLET | Freq: Two times a day (BID) | ORAL | 3 refills | Status: DC

## 2020-06-23 MED ORDER — BD PEN NEEDLE MINI U/F 31G X 5 MM MISC: 2 refills | Status: DC

## 2020-06-23 NOTE — Telephone Encounter (Signed)
Requesting to speak with a nurse about blood sugar being in the 400s. Please call pt back.

## 2020-06-23 NOTE — Progress Notes (Signed)
Internal Medicine Clinic Attending  Case discussed with Dr. Agyei at the time of the visit.  We reviewed the resident's history and exam and pertinent patient test results.  I agree with the assessment, diagnosis, and plan of care documented in the resident's note.    

## 2020-06-23 NOTE — Telephone Encounter (Signed)
Agree with telehealth

## 2020-06-23 NOTE — Telephone Encounter (Signed)
Return call to pt's daughter, Runell Gess - stated her mother had an appt this morning at 0945 Am but they were running late b/c the pt felled this morning. [place] office told her pt appt had been canceled and re-scheduled for 3/11. Daughter stated this is the second time this had happened being told her mother had no appt; stated she writes every time down since she's in school and have to keep up with her appts also. I asked about pt's BS - stated it was in the 400's around 0800 Am this morning. Daughter also stated pt c/o weakness and neuropathy of her feet.  Stated when pt went to ED, they were told to f/u in a week.  No open appts this morning. I asked if they were here at the hospital; Stated she's taking her mother to get tested for covid. I offered telehealth appt for today; agreed - front office states this afternoon with Dr Truman Hayward. Informed daughter to have her cell phone with her.

## 2020-06-23 NOTE — Progress Notes (Signed)
[facility] Internal Medicine Residency Telephone Encounter Continuity Care Appointment  HPI:   This telephone encounter was created for [name] on 06/23/2020 for the following purpose/cc fall.  Spoke with both Mrs.Latini and her daughter, Runell Gess, via conference call. She mentions that she has been having some confusion regarding recent hospitalization and which medications need to be continued and discontinued. She also explains that since discharge she has been feeling fatigued and weak. She mentions she was unable to make her in-person appointment this morning due to fall at home. Denies any head trauma, loss of consciousness or significant pain but does mentions continuing to feel weak. She denies significant pain of her ankle and states she is taking her medications as prescribed. Unable to state her current or recent glucose check results. She is also almost out of her meds. She denies any significant fevers, chills, nausea, vomiting.   Past Medical History:  Past Medical History:  Diagnosis Date  . Anemia, iron deficiency   . Atrial fibrillation ([place])   . Blindness of left eye   . Decreased visual acuity    Left eye  . Depression   . 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 Hair loss   . History of fracture of clavicle 05/18/2015  . Hyperlipidemia   . Hypertension   . Iron deficiency anemia 05/13/2013  . Pain, dental 08/19/2018   Tooth pain/facial swelling: has poor dentition at baseline, history of dental abscess.  She has not seen a dentist in about one year.  Dentist is on [address].  No fevers chills or systemic symptoms.  Diabetes has been poorly controlled for some time.  She said it has improved recently averaging around 140.  Called the dentist said they would not see patients until May 14th but the urgency o  . Pap smear  abnormality of cervix with LGSIL   . Routine/ritual circumcision   . Type II diabetes mellitus ([place])   . Uveitis       ROS:  Review of Systems  Constitutional: Positive for malaise/fatigue. Negative for chills and fever.  Respiratory: Negative for shortness of breath.   Cardiovascular: Negative for chest pain, palpitations and leg swelling.  Gastrointestinal: Negative for constipation, diarrhea, nausea and vomiting.  Musculoskeletal: Positive for falls.  Neurological: Positive for tingling and sensory change. Negative for weakness.  All other systems reviewed and are negative.      Assessment / Plan / Recommendations:   Please see A&P under problem oriented charting for assessment of the patient's acute and chronic medical conditions.   As always, pt is advised that if symptoms worsen or new symptoms arise, they should go to an urgent care facility or to to ER for further evaluation.   Consent and Medical Decision Making:   Patient discussed with Dr. Philipp Ovens  This is a telephone encounter between Jennifer [name] and Mosetta Anis on 06/23/2020 for fall. The visit was conducted with the patient located at home and Mosetta Anis at [facility]. The patient's identity was confirmed using their DOB and current address. The patient has consented to being evaluated through a telephone encounter and understands the associated risks (an examination cannot be done and the patient may need to come in for an appointment) / benefits (allows the patient to remain at home, decreasing exposure to coronavirus). I personally spent 17 minutes on medical discussion.

## 2020-06-24 LAB — NOVEL CORONAVIRUS, NAA: SARS-CoV-2, NAA: NOT DETECTED

## 2020-06-24 LAB — SARS-COV-2, NAA 2 DAY TAT

## 2020-06-25 ENCOUNTER — Encounter: Payer: Self-pay | Admitting: Internal Medicine

## 2020-06-25 NOTE — Assessment & Plan Note (Signed)
Lab Results  Component Value Date   WBC 5.1 06/18/2020   HGB 10.3 (L) 06/18/2020   HCT 33.6 (L) 06/18/2020   MCV 83.0 06/18/2020   PLT 111 (L) 06/18/2020   Known history of iron deficiency anemia. Currently on oral iron supplement. Recent iron panel shows appropriate repletion. Likely also anemia of chronic disease contributing with hx of uncontrolled diabetes and normocytic anemia. Advised to f/u in clinic for lab check. - ferrous sulfate refills send - F/u in clinic for cbc

## 2020-06-25 NOTE — Assessment & Plan Note (Signed)
Atorvastatin fell off medlist during recent admission. Unclear why, but has history of not taking atorvastatin due to med-inadherence. Discussed importance of continuing statin therapy in setting of uncontrolled diabetes and hyperlipidemia. - C/w atorvastatin 40mg 

## 2020-06-25 NOTE — Assessment & Plan Note (Signed)
Has hx of A.fib with CHADS2VASC2 score of 4. Currently denies any bleeding event. On eliquis but hospital discharge shows medication removed from [place]. Chart review does not show obvious reason to discontinue. Patient mentions unsure if she should be on it. Discussed benefit and risk of stroke and need to continue anti-coagulation. Patient and family expressed understanding.  - Continue Eliquis 5mg  BID

## 2020-06-25 NOTE — Assessment & Plan Note (Addendum)
Lab Results  Component Value Date   HGBA1C 11.6 (H) 06/16/2020   Unable to describe current blood sugar but mentions feeling weak. Currently needs refills on her insulin. Mentions polyuria. Denies nausea, vomiting, fevers, chills.  A/P Suspect malaise due to hyperglycemia in setting of known history of uncontrolled diabetes and frequent difficulties with adherence due to language barrier and low health literacy. Currently on Novolin 70/30 15-30 units BID. Advised to make in-person appointment as soon as possible. Discussed red-flags for return to ED. - Advised on importance of glucose monitoring - C/w metformin 500mg  (unable to tolerate higher dose due to GI side effects) - Refills sent for Novolin  - F/u in in-person visit

## 2020-06-26 NOTE — Progress Notes (Signed)
Internal Medicine Clinic Attending  Case discussed with Dr. Lee  At the time of the visit.  We reviewed the resident's history and exam and pertinent patient test results.  I agree with the assessment, diagnosis, and plan of care documented in the resident's note.    

## 2020-06-28 ENCOUNTER — Encounter (HOSPITAL_COMMUNITY): Payer: Self-pay | Admitting: Emergency Medicine

## 2020-06-28 ENCOUNTER — Encounter: Payer: Medicaid Other | Admitting: Student

## 2020-06-28 ENCOUNTER — Ambulatory Visit (INDEPENDENT_AMBULATORY_CARE_PROVIDER_SITE_OTHER): Payer: Medicaid Other | Admitting: Student

## 2020-06-28 ENCOUNTER — Other Ambulatory Visit: Payer: Self-pay

## 2020-06-28 ENCOUNTER — Emergency Department (HOSPITAL_COMMUNITY)
Admission: EM | Admit: 2020-06-28 | Discharge: 2020-06-29 | Disposition: A | Discharge status: Home / Self Care | Payer: Medicaid Other | Attending: Emergency Medicine | Admitting: Emergency Medicine

## 2020-06-28 ENCOUNTER — Telehealth: Payer: Self-pay | Admitting: Internal Medicine

## 2020-06-28 DIAGNOSIS — Z794 Long term (current) use of insulin: Secondary | ICD-10-CM | POA: Diagnosis not present

## 2020-06-28 DIAGNOSIS — Z7984 Long term (current) use of oral hypoglycemic drugs: Secondary | ICD-10-CM | POA: Diagnosis not present

## 2020-06-28 DIAGNOSIS — E119 Type 2 diabetes mellitus without complications: Secondary | ICD-10-CM | POA: Insufficient documentation

## 2020-06-28 DIAGNOSIS — R112 Nausea with vomiting, unspecified: Secondary | ICD-10-CM | POA: Diagnosis not present

## 2020-06-28 DIAGNOSIS — Z8616 Personal history of COVID-19: Secondary | ICD-10-CM | POA: Diagnosis not present

## 2020-06-28 DIAGNOSIS — N390 Urinary tract infection, site not specified: Secondary | ICD-10-CM | POA: Insufficient documentation

## 2020-06-28 DIAGNOSIS — R1030 Lower abdominal pain, unspecified: Secondary | ICD-10-CM | POA: Diagnosis present

## 2020-06-28 DIAGNOSIS — I10 Essential (primary) hypertension: Secondary | ICD-10-CM | POA: Diagnosis not present

## 2020-06-28 LAB — CBC
HCT: 38.1 % (ref 36.0–46.0)
Hemoglobin: 11.6 g/dL — ABNORMAL LOW (ref 12.0–15.0)
MCH: 25.5 pg — ABNORMAL LOW (ref 26.0–34.0)
MCHC: 30.4 g/dL (ref 30.0–36.0)
MCV: 83.7 fL (ref 80.0–100.0)
Platelets: 203 10*3/uL (ref 150–400)
RBC: 4.55 MIL/uL (ref 3.87–5.11)
RDW: 14.6 % (ref 11.5–15.5)
WBC: 8.7 10*3/uL (ref 4.0–10.5)
nRBC: 0 % (ref 0.0–0.2)

## 2020-06-28 LAB — COMPREHENSIVE METABOLIC PANEL
ALT: 10 U/L (ref 0–44)
AST: 10 U/L — ABNORMAL LOW (ref 15–41)
Albumin: 2.3 g/dL — ABNORMAL LOW (ref 3.5–5.0)
Alkaline Phosphatase: 108 U/L (ref 38–126)
Anion gap: 13 (ref 5–15)
BUN: 20 mg/dL (ref 6–20)
CO2: 22 mmol/L (ref 22–32)
Calcium: 9.2 mg/dL (ref 8.9–10.3)
Chloride: 94 mmol/L — ABNORMAL LOW (ref 98–111)
Creatinine, Ser: 1.56 mg/dL — ABNORMAL HIGH (ref 0.44–1.00)
GFR, Estimated: 41 mL/min — ABNORMAL LOW (ref >60)
Glucose, Bld: 347 mg/dL — ABNORMAL HIGH (ref 70–99)
Potassium: 4.2 mmol/L (ref 3.5–5.1)
Sodium: 129 mmol/L — ABNORMAL LOW (ref 135–145)
Total Bilirubin: 0.5 mg/dL (ref 0.3–1.2)
Total Protein: 8.4 g/dL — ABNORMAL HIGH (ref 6.5–8.1)

## 2020-06-28 LAB — LIPASE, BLOOD: Lipase: 28 U/L (ref 11–51)

## 2020-06-28 LAB — CBG MONITORING, ED
Glucose-Capillary: 337 mg/dL — ABNORMAL HIGH (ref 70–99)
Glucose-Capillary: 267 mg/dL — ABNORMAL HIGH (ref 70–99)

## 2020-06-28 NOTE — Telephone Encounter (Signed)
Pt's daughter cancelled appt for today and is  requesting a call back.  She states the patient  is not feeling well enough to come in today, has diarrhea, weak , not able to get up.  Please call back.

## 2020-06-28 NOTE — Assessment & Plan Note (Addendum)
Patient reports 3-day history of fatigue, nausea, vomiting, abdominal pain and diarrhea.  Her last emesis episode was last night.  Patient tolerates a little bit of food and salad this morning without vomiting.  Nobody else in the family has similar symptoms.  Patient currently living with her husband and her sons who do not take care for medication.  Patient daughter reports that patient giving her own medications.  Her daughter reports that patient CBG yesterday was 200.  Patient reports compliant with insulin which she takes 30 units in the morning of 15 units at night plus Metformin.   Patient was recently hospitalized in February for HHS secondary to Covid infection and diet indiscretion.  Assessment and plan Differentials include gastroparesis vs viral gastroenteritis vs possible DKA.  I am concerned about possible DKA given patient's history of multiple hospitalizations for DKA, combining with her symptoms of nausea, vomiting, decreased appetite and fatigue.  I am also concerned about patient self administering medications, especially insulin, given her confusion about medications in the past.  I advised patient and patient's daughter to go to the ED for blood work to rule out DKA.  I explained to them the risk of not treating a DKA.  Patient's daughter verbalizes understanding and will take patient to the ED.  -Patient may require CCM to take care of her home medications

## 2020-06-28 NOTE — Progress Notes (Signed)
  [facility] Internal Medicine Residency Telephone Encounter Continuity Care Appointment  HPI:   This telephone encounter was created for [name] on 06/28/2020 for the following purpose/cc Nausea/Vommitting, diarrhea, fatigue.   Past Medical History:  Past Medical History:  Diagnosis Date  . Anemia, iron deficiency   . Atrial fibrillation ([place])   . Blindness of left eye   . Decreased visual acuity    Left eye  . Depression   . 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 Hair loss   . History of fracture of clavicle 05/18/2015  . Hyperlipidemia   . Hypertension   . Iron deficiency anemia 05/13/2013  . Pain, dental 08/19/2018   Tooth pain/facial swelling: has poor dentition at baseline, history of dental abscess.  She has not seen a dentist in about one year.  Dentist is on [address].  No fevers chills or systemic symptoms.  Diabetes has been poorly controlled for some time.  She said it has improved recently averaging around 140.  Called the dentist said they would not see patients until May 14th but the urgency o  . Pap smear abnormality of cervix with LGSIL   . Routine/ritual circumcision   . Type II diabetes mellitus ([place])   . Uveitis       ROS:  Review of Systems  Constitutional: Positive for malaise/fatigue.  Gastrointestinal: Positive for abdominal pain, diarrhea, nausea and vomiting.     Assessment / Plan / Recommendations:   Please see A&P under problem oriented charting for assessment of the patient's acute and chronic medical conditions.   As always, pt is advised that if symptoms worsen or new symptoms arise, they should go to an urgent care facility or to to ER for further evaluation.   Consent and Medical Decision Making:   Patient discussed with Dr. Dareen Piano  This is a telephone encounter between Jennifer [name] and  Gaylan Gerold on 06/28/2020 for Nausea, vomiting, diarrhea and fatigue. The visit was conducted with the patient located at home and Gaylan Gerold at [facility]. The patient's identity was confirmed using their DOB and current address. Patient's daughter helped with translating. The patient has consented to being evaluated through a telephone encounter and understands the associated risks (an examination cannot be done and the patient may need to come in for an appointment) / benefits (allows the patient to remain at home, decreasing exposure to coronavirus). I personally spent 16 minutes on medical discussion.

## 2020-06-28 NOTE — Telephone Encounter (Signed)
RTC to [facility], [place], [place], and she 3-way called with patient.  Patient had an in-person appt this afternoon, but cancelled because she states she was "too weak and not feeling well enough to come".  Per daughter translating, pt c/o abdominal pain, constipation X2-3days, very weak, nauseated and states "I'm not really eating because everything is coming up".  RN advised that she seek care in ED, based on her hx, symptoms, and medical hx.  Daughter states she wants to talk w/ doctor because they just got out of the hospital.  Telehealth appt made with Dr. Alfonse Spruce for today at 3:15.  Daughter is asking MD to call her phone and she will 3 way call again.   Thank you, SChaplin, RN,BSN

## 2020-06-28 NOTE — ED Triage Notes (Signed)
Patient complains of recurrent abdominal pain and vomiting over the last three months. Patient alert, oriented, and in no apparent distress at this time.

## 2020-06-29 ENCOUNTER — Emergency Department (HOSPITAL_COMMUNITY): Payer: Medicaid Other

## 2020-06-29 ENCOUNTER — Other Ambulatory Visit: Payer: Self-pay

## 2020-06-29 LAB — URINALYSIS, ROUTINE W REFLEX MICROSCOPIC
Bilirubin Urine: NEGATIVE
Glucose, UA: 150 mg/dL — AB
Ketones, ur: 5 mg/dL — AB
Nitrite: NEGATIVE
Protein, ur: 100 mg/dL — AB
Specific Gravity, Urine: 1.014 (ref 1.005–1.030)
WBC, UA: >50 WBC/hpf — ABNORMAL HIGH (ref 0–5)
pH: 5 (ref 5.0–8.0)

## 2020-06-29 LAB — I-STAT CHEM 8, ED
BUN: 20 mg/dL (ref 6–20)
Calcium, Ion: 1.19 mmol/L (ref 1.15–1.40)
Chloride: 103 mmol/L (ref 98–111)
Creatinine, Ser: 1.3 mg/dL — ABNORMAL HIGH (ref 0.44–1.00)
Glucose, Bld: 194 mg/dL — ABNORMAL HIGH (ref 70–99)
HCT: 33 % — ABNORMAL LOW (ref 36.0–46.0)
Hemoglobin: 11.2 g/dL — ABNORMAL LOW (ref 12.0–15.0)
Potassium: 4.1 mmol/L (ref 3.5–5.1)
Sodium: 137 mmol/L (ref 135–145)
TCO2: 24 mmol/L (ref 22–32)

## 2020-06-29 LAB — I-STAT BETA HCG BLOOD, ED (MC, WL, AP ONLY): I-stat hCG, quantitative: 5.3 m[IU]/mL — ABNORMAL HIGH (ref <5)

## 2020-06-29 LAB — CBG MONITORING, ED: Glucose-Capillary: 178 mg/dL — ABNORMAL HIGH (ref 70–99)

## 2020-06-29 MED ORDER — SULFAMETHOXAZOLE-TRIMETHOPRIM 800-160 MG PO TABS: 1.0000 | ORAL_TABLET | Freq: Two times a day (BID) | ORAL | 0 refills | Status: AC

## 2020-06-29 MED ORDER — SULFAMETHOXAZOLE-TRIMETHOPRIM 800-160 MG PO TABS
1.0000 | ORAL_TABLET | Freq: Once | ORAL | Status: AC
  Administered 2020-06-29: 1 via ORAL
  Filled 2020-06-29: qty 1

## 2020-06-29 MED ORDER — SODIUM CHLORIDE 0.9 % IV BOLUS (SEPSIS)
1000.0000 mL | Freq: Once | INTRAVENOUS | Status: AC
  Administered 2020-06-29: 1000 mL via INTRAVENOUS

## 2020-06-29 MED ORDER — DIAZEPAM 5 MG/ML IJ SOLN
2.5000 mg | Freq: Once | INTRAMUSCULAR | Status: AC
  Administered 2020-06-29: 2.5 mg via INTRAVENOUS
  Filled 2020-06-29: qty 2

## 2020-06-29 MED ORDER — SODIUM CHLORIDE 0.9 % IV SOLN
1000.0000 mL | INTRAVENOUS | Status: DC
  Administered 2020-06-29: 1000 mL via INTRAVENOUS

## 2020-06-29 MED ORDER — NITROFURANTOIN MONOHYD MACRO 100 MG PO CAPS: 100.0000 mg | ORAL_CAPSULE | Freq: Once | ORAL | Status: DC

## 2020-06-29 MED ORDER — METOCLOPRAMIDE HCL 5 MG/ML IJ SOLN
10.0000 mg | Freq: Once | INTRAMUSCULAR | Status: AC
  Administered 2020-06-29: 10 mg via INTRAVENOUS
  Filled 2020-06-29: qty 2

## 2020-06-29 NOTE — ED Provider Notes (Signed)
Patient care assumed from previous provider.  Briefly this is a 50 year old female who presented with nausea, chills.  Patient has had intermittent dysuria and had mild suprapubic tenderness to palpation by the previous doctor.  Patient was found to have acute on chronic kidney dysfunction and mild hyponatremia, after IV hydration this has improved.  We are pending urinalysis and then anticipate discharge. Physical Exam  BP (!) 165/78 (BP Location: Right Arm)   Pulse 90   Temp 98.9 F (37.2 C) (Oral)   Resp 18   Ht 5\' 7"  (1.702 m)   Wt 82 kg   LMP 08/21/2016 (Exact Date)   SpO2 98%   BMI 28.31 kg/m   Physical Exam Vitals and nursing note reviewed.  Constitutional:      Appearance: Normal appearance.  HENT:     Head: Normocephalic.     Mouth/Throat:     Mouth: Mucous membranes are moist.  Cardiovascular:     Rate and Rhythm: Normal rate.  Pulmonary:     Effort: Pulmonary effort is normal. No respiratory distress.  Abdominal:     Palpations: Abdomen is soft.     Tenderness: There is no abdominal tenderness. There is no guarding or rebound.  Skin:    General: Skin is warm.  Neurological:     Mental Status: She is alert and oriented to person, place, and time. Mental status is at baseline.  Psychiatric:        Mood and Affect: Mood normal.     ED Course/Procedures     Procedures  MDM  Urinalysis is positive for urinary tract infection.  On reevaluation abdomen is soft.  She denies any flank/back pain, no history of kidney stones.  Patient will be given Bactrim based off of her previous urine cultures, this urine has been sent for culture as well.  She feels improved, she sitting up, able to eat and drink.  I agree with outpatient management, daughter is at bedside.  Patient will be discharged and treated as an outpatient.  Discharge plan and strict return to ED precautions discussed, patient verbalizes understanding and agreement.      Rozelle Logan, DO 06/29/20  [id number]

## 2020-06-29 NOTE — ED Notes (Signed)
Attempted to straight cath patient per MD request. 3 RNs attempted but were unable to straight cath patinet. Pt in NAD. [place]wick placed on patient and educated on use.

## 2020-06-29 NOTE — ED Notes (Addendum)
Spoke with main lab, states they never received pt's urine, will obtain new sample.

## 2020-06-29 NOTE — ED Notes (Signed)
IV team at bedside 

## 2020-06-29 NOTE — ED Provider Notes (Signed)
[facility] EMERGENCY DEPARTMENT Provider Note  CSN: [id number] Arrival date & time: 06/28/20 1720  Chief Complaint(s) Abdominal Pain  HPI Jennifer [name] is a 50 y.o. female with a past medical history listed below including diabetes recently admitted for HHS and found to have COVID-19 who presents to the emergency department for 3 days of constant and fluctuating lower extremity discomfort.  Patient also endorsing constipation.  Reports nausea and nonbloody nonbilious emesis approximately 4 times per day with inability to tolerate oral intake.  She endorses some chills but no overt fevers. Some generalized fatigue.  No chest pain or shortness of breath.  Reports frequency.  No other physical complaints.  HPI  Past Medical History Past Medical History:  Diagnosis Date  . Anemia, iron deficiency   . Atrial fibrillation ([place])   . Blindness of left eye   . Decreased visual acuity    Left eye  . Depression   . 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 Hair loss   . History of fracture of clavicle 05/18/2015  . Hyperlipidemia   . Hypertension   . Iron deficiency anemia 05/13/2013  . Pain, dental 08/19/2018   Tooth pain/facial swelling: has poor dentition at baseline, history of dental abscess.  She has not seen a dentist in about one year.  Dentist is on [address].  No fevers chills or systemic symptoms.  Diabetes has been poorly controlled for some time.  She said it has improved recently averaging around 140.  Called the dentist said they would not see patients until May 14th but the urgency o  . Pap smear abnormality of cervix with LGSIL   . Routine/ritual circumcision   . Type II diabetes mellitus ([place])   . Uveitis    Patient Active Problem List   Diagnosis Date Noted  . Nausea & vomiting 06/28/2020  . Hyperglycemia 06/15/2020  .  COVID-19 virus infection 06/15/2020  . Early satiety 05/31/2020  . UTI (urinary tract infection) 05/16/2020  . Elevated liver enzymes 05/15/2020  . Hyperglycemia due to diabetes mellitus ([place]) 05/15/2020  . Closed trimalleolar fracture of right ankle 05/02/2020  . Normocytic anemia 03/27/2020  . Cellulitis (Left 2nd toe) 03/27/2020  . Cellulitis of left foot 03/17/2020  . AKI (acute kidney injury) ([place]) 03/14/2020  . Hypotension 03/10/2020  . Right ankle injury 09/28/2019  . Bilateral lower extremity edema 06/07/2019  . Atrial fibrillation ([place]) 09/20/2018  . Migraine   . Hematuria 08/24/2018  . Vulvovaginitis 05/21/2018  . (HFpEF) heart failure with preserved ejection fraction ([place]) 01/01/2018  . 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  . High risk medication use 09/16/2017  . Pseudophakia of right eye 09/16/2017  . Left anterior shoulder pain 07/06/2017  . Hypertension associated with diabetes ([place]) 07/23/2016  . 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 Fatigue 04/02/2016  . 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 Vitamin D deficiency 10/30/2014  . Iron deficiency anemia 05/13/2013  . Hair loss 03/24/2012  . Healthcare maintenance 02/26/2011  . Hyperlipidemia 05/05/2006  . Type II diabetes mellitus ([place]) 04/22/1998   Home Medication(s) Prior to Admission medications   Medication Sig Start Date End Date Taking? Authorizing Provider  ACCU-CHEK GUIDE test strip CHECK BLOOD SUGAR 4 TIMES PER DAY 06/23/20   Mosetta Anis, MD  Accu-Chek FastClix Lancets MISC Check blood sugar 4 times a  day 06/07/19   Jose Persia, MD  acetaminophen (TYLENOL) 500 MG tablet Take 500 mg by mouth every 6 (six) hours as needed for mild pain.    [provider]  apixaban (ELIQUIS) 5 MG TABS tablet Take 1 tablet (5 mg total) by mouth 2 (two) times daily. 06/23/20   Mosetta Anis, MD  atorvastatin (LIPITOR) 40 MG tablet Take 1 tablet (40 mg total) by mouth daily. 06/23/20   Mosetta Anis, MD  Blood Glucose Monitoring Suppl (ACCU-CHEK GUIDE) w/Device KIT 1 each by Does not apply route 4 (four) times daily. 11/19/18   Jose Persia, MD  diclofenac Sodium (VOLTAREN) 1 % GEL Apply 2 g topically 4 (four) times daily. 06/18/20 07/18/20  Rehman, Areeg N, DO  ferrous sulfate 325 (65 FE) MG tablet Take 1 tablet (325 mg total) by mouth every Monday, Wednesday, and Friday for 14 days. 06/23/20 07/07/20  Mosetta Anis, MD  insulin aspart protamine - aspart (NOVOLOG MIX 70/30 FLEXPEN) (70-30) 100 UNIT/ML FlexPen Inject 0.15-0.3 mLs (15-30 Units total) into the skin 2 (two) times daily. 30 unit morning, 15 unit supper 06/23/20   Mosetta Anis, MD  Insulin Pen Needle (B-D UF III MINI PEN NEEDLES) 31G X 5 MM MISC Use pen needle daily for injections 06/23/20   Mosetta Anis, MD  metFORMIN (GLUCOPHAGE-XR) 500 MG 24 hr tablet Take 1 tablet (500 mg total) by mouth daily with breakfast. 06/23/20   Mosetta Anis, MD                                                                                                                                    Past Surgical History Past Surgical History:  Procedure Laterality Date  . CESAREAN SECTION     x 1  . EYE SURGERY Bilateral    Lasik  . MULTIPLE TOOTH EXTRACTIONS     bottom denture  . ORIF ANKLE FRACTURE Right 05/05/2020   Procedure: OPEN REDUCTION INTERNAL FIXATION (ORIF) ANKLE FRACTURE;  Surgeon: Shona Needles, MD;  Location: [facility];  Service: Orthopedics;  Laterality: Right;   Family History Family History  Problem Relation Age of Onset  . Diabetes Father   . Hypertension Other     Social History Social History   Tobacco Use  . Smoking status: Never Smoker  . Smokeless tobacco: Never Used  Vaping Use  . Vaping Use: Never used  Substance Use Topics  . Alcohol use: No  . Drug use: No   Allergies Patient has no known allergies.  Review of Systems Review of Systems All other systems are reviewed and are negative for acute change except as noted  in the HPI   Physical Exam Vital Signs  I have reviewed the triage vital signs BP (!) 172/61   Pulse 80   Temp 99.1 F (37.3 C) (Oral)   Resp 16   LMP 08/21/2016 (Exact Date)  SpO2 93%   Physical Exam Vitals reviewed.  Constitutional:      General: She is not in acute distress.    Appearance: She is well-developed. She is not diaphoretic.  HENT:     Head: Normocephalic and atraumatic.     Right Ear: External ear normal.     Left Ear: External ear normal.     Nose: Nose normal.  Eyes:     General: No scleral icterus.    Conjunctiva/sclera: Conjunctivae normal.  Neck:     Trachea: Phonation normal.  Cardiovascular:     Rate and Rhythm: Normal rate and regular rhythm.  Pulmonary:     Effort: Pulmonary effort is normal. No respiratory distress.     Breath sounds: No stridor.  Abdominal:     General: There is no distension.     Tenderness: There is abdominal tenderness (mild discomfort) in the suprapubic area. There is no guarding or rebound. Negative signs include Murphy's sign and McBurney's sign.  Musculoskeletal:        General: Normal range of motion.     Cervical back: Normal range of motion.  Neurological:     Mental Status: She is alert and oriented to person, place, and time.  Psychiatric:        Behavior: Behavior normal.     ED Results and Treatments Labs (all labs ordered are listed, but only abnormal results are displayed) Labs Reviewed  COMPREHENSIVE METABOLIC PANEL - Abnormal; Notable for the following components:      Result Value   Sodium 129 (*)    Chloride 94 (*)    Glucose, Bld 347 (*)    Creatinine, Ser 1.56 (*)    Total Protein 8.4 (*)    Albumin 2.3 (*)    AST 10 (*)    GFR, Estimated 41 (*)    All other components within normal limits  CBC - Abnormal; Notable for the following components:   Hemoglobin 11.6 (*)    MCH 25.5 (*)    All other components within normal limits  CBG MONITORING, ED - Abnormal; Notable for the following  components:   Glucose-Capillary 337 (*)    All other components within normal limits  CBG MONITORING, ED - Abnormal; Notable for the following components:   Glucose-Capillary 267 (*)    All other components within normal limits  I-STAT BETA HCG BLOOD, ED ([facility], [facility], [facility] ONLY) - Abnormal; Notable for the following components:   I-stat hCG, quantitative 5.3 (*)    All other components within normal limits  LIPASE, BLOOD  URINALYSIS, ROUTINE W REFLEX MICROSCOPIC  I-STAT CHEM 8, ED                                                                                                                         EKG  EKG Interpretation  Date/Time:  Thursday June 29 2020 04:05:46 EST Ventricular Rate:  106 PR Interval:  118 QRS Duration: 88 QT Interval:  334 QTC Calculation:  443 R Axis:   -2 Text Interpretation: Sinus tachycardia Possible Anterior infarct , age undetermined Abnormal ECG Confirmed by Addison Lank [phone]) on 06/29/2020 5:39:23 AM      Radiology DG Abdomen Acute W/Chest  Result Date: 06/29/2020 CLINICAL DATA:  Abdominal pain, vomiting EXAM: DG ABDOMEN ACUTE WITH 1 VIEW CHEST COMPARISON:  None. FINDINGS: The lungs are symmetrically expanded. There are mild bilateral asymmetric mid and lower lung zone interstitial pulmonary infiltrates which are likely infectious or inflammatory in nature. No pneumothorax or pleural effusion. Cardiac size within normal limits. Normal abdominal gas pattern. No free intraperitoneal gas. No organomegaly. No abnormal calcifications within the abdomen and pelvis. No acute bone abnormality. IMPRESSION: Mild bibasilar pulmonary infiltrates, possibly infectious or inflammatory in nature. Correlation with COVID-19 serology may be helpful. Normal abdominal gas pattern. Electronically Signed   By: Fidela Salisbury MD   On: 06/29/2020 02:26    Pertinent labs & imaging results that were available during my care of the patient were reviewed by me and considered in my  medical decision making (see chart for details).  Medications Ordered in ED Medications  sodium chloride 0.9 % bolus 1,000 mL (0 mLs Intravenous Stopped 06/29/20 0543)    Followed by  0.9 %  sodium chloride infusion (has no administration in time range)  metoCLOPramide (REGLAN) injection 10 mg (10 mg Intravenous Given 06/29/20 0059)  diazepam (VALIUM) injection 2.5 mg (2.5 mg Intravenous Given 06/29/20 0618)                                                                                                                                    Procedures Procedures  (including critical care time)  Medical Decision Making / ED Course I have reviewed the nursing notes for this encounter and the patient's prior records (if available in EHR or on provided paperwork).   Jennifer [name] was evaluated in Emergency Department on 06/29/2020 for the symptoms described in the history of present illness. She was evaluated in the context of the global COVID-19 pandemic, which necessitated consideration that the patient might be at risk for infection with the SARS-CoV-2 virus that causes COVID-19. Institutional protocols and algorithms that pertain to the evaluation of patients at risk for COVID-19 are in a state of rapid change based on information released by regulatory bodies including the CDC and federal and state organizations. These policies and algorithms were followed during the patient's care in the ED.  Patient presents with several days of lower abdominal discomfort with reported nausea and vomiting. Abdomen with mild suprapubic discomfort without evidence of peritonitis. Labs notable for leukocytosis without evidence of DKA or HHS. Patient does have mild renal insufficiency.  No leukocytosis. Acute abdominal series negative for evidence of bowel obstruction.  I have low suspicion for serious intra-abdominal inflammatory/infectious process requiring CT scan at this time. Awaiting UA.  Patient was  provided with IV fluids and antiemetics. Nursing staff attempted to  perform in and out but or unsuccessful after patient attempted to provide [place] with a sample but missed the toilet.  She was provided a total of 2 L of IV fluids.  We will recheck renal function while the UA is pending. Po challenge as well.      Final Clinical Impression(s) / ED Diagnoses Final diagnoses:  None      This chart was dictated using voice recognition software.  Despite best efforts to proofread,  errors can occur which can change the documentation meaning.   Fatima Blank, MD 06/29/20 [phone]

## 2020-06-29 NOTE — Discharge Instructions (Addendum)
You have been seen and discharged from the emergency department.  You have been diagnosed with a urinary tract infection, take antibiotic as directed.  Follow-up with your primary provider for reevaluation. Take home medications as prescribed. If you have any worsening symptoms or further concerns for health please return to an emergency department for further evaluation.

## 2020-06-30 ENCOUNTER — Encounter: Payer: Medicaid Other | Admitting: Student

## 2020-07-03 ENCOUNTER — Encounter: Payer: Self-pay | Admitting: Internal Medicine

## 2020-07-03 ENCOUNTER — Ambulatory Visit: Payer: Medicaid Other | Admitting: Dietician

## 2020-07-03 ENCOUNTER — Encounter: Payer: Self-pay | Admitting: Dietician

## 2020-07-03 ENCOUNTER — Telehealth: Payer: Self-pay | Admitting: Internal Medicine

## 2020-07-03 ENCOUNTER — Other Ambulatory Visit: Payer: Self-pay

## 2020-07-03 ENCOUNTER — Ambulatory Visit (INDEPENDENT_AMBULATORY_CARE_PROVIDER_SITE_OTHER): Payer: Medicaid Other | Admitting: Internal Medicine

## 2020-07-03 ENCOUNTER — Encounter: Payer: Medicaid Other | Admitting: Internal Medicine

## 2020-07-03 VITALS — BP 98/62 | HR 83 | Temp 97.5°F | Ht 66.0 in | Wt 180.0 lb

## 2020-07-03 DIAGNOSIS — N3001 Acute cystitis with hematuria: Secondary | ICD-10-CM

## 2020-07-03 DIAGNOSIS — R627 Adult failure to thrive: Secondary | ICD-10-CM | POA: Diagnosis not present

## 2020-07-03 DIAGNOSIS — Z794 Long term (current) use of insulin: Secondary | ICD-10-CM | POA: Diagnosis not present

## 2020-07-03 DIAGNOSIS — E11319 Type 2 diabetes mellitus with unspecified diabetic retinopathy without macular edema: Secondary | ICD-10-CM | POA: Diagnosis not present

## 2020-07-03 LAB — CBC WITH DIFFERENTIAL/PLATELET
Abs Immature Granulocytes: 0.03 10*3/uL (ref 0.00–0.07)
Basophils Absolute: 0 10*3/uL (ref 0.0–0.1)
Basophils Relative: 1 %
Eosinophils Absolute: 0.1 10*3/uL (ref 0.0–0.5)
Eosinophils Relative: 2 %
HCT: 34.6 % — ABNORMAL LOW (ref 36.0–46.0)
Hemoglobin: 10.4 g/dL — ABNORMAL LOW (ref 12.0–15.0)
Immature Granulocytes: 1 %
Lymphocytes Relative: 32 %
Lymphs Abs: 1.6 10*3/uL (ref 0.7–4.0)
MCH: 25.6 pg — ABNORMAL LOW (ref 26.0–34.0)
MCHC: 30.1 g/dL (ref 30.0–36.0)
MCV: 85 fL (ref 80.0–100.0)
Monocytes Absolute: 0.3 10*3/uL (ref 0.1–1.0)
Monocytes Relative: 5 %
Neutro Abs: 3.1 10*3/uL (ref 1.7–7.7)
Neutrophils Relative %: 59 %
Platelets: 238 10*3/uL (ref 150–400)
RBC: 4.07 MIL/uL (ref 3.87–5.11)
RDW: 14.6 % (ref 11.5–15.5)
WBC: 5.1 10*3/uL (ref 4.0–10.5)
nRBC: 0 % (ref 0.0–0.2)

## 2020-07-03 LAB — BASIC METABOLIC PANEL
Anion gap: 11 (ref 5–15)
BUN: 13 mg/dL (ref 6–20)
CO2: 22 mmol/L (ref 22–32)
Calcium: 9 mg/dL (ref 8.9–10.3)
Chloride: 96 mmol/L — ABNORMAL LOW (ref 98–111)
Creatinine, Ser: 1.4 mg/dL — ABNORMAL HIGH (ref 0.44–1.00)
GFR, Estimated: 46 mL/min — ABNORMAL LOW (ref >60)
Glucose, Bld: 519 mg/dL (ref 70–99)
Potassium: 5 mmol/L (ref 3.5–5.1)
Sodium: 129 mmol/L — ABNORMAL LOW (ref 135–145)

## 2020-07-03 LAB — URINE CULTURE: Culture: >=100000 — AB

## 2020-07-03 LAB — LACTIC ACID, PLASMA: Lactic Acid, Venous: 1.2 mmol/L (ref 0.5–1.9)

## 2020-07-03 MED ORDER — CITALOPRAM HYDROBROMIDE 10 MG PO TABS: ORAL_TABLET | ORAL | 0 refills | Status: DC

## 2020-07-03 NOTE — Progress Notes (Signed)
Diabetes Self-Management Education  Visit Type: Follow-up  Appt. Start Time: 1530 Appt. End Time: 5465  07/03/2020  [name], identified by name and date of birth, is a 50 y.o. female with a diagnosis of Diabetes:  .   ASSESSMENT  Wt Readings from Last 10 Encounters:  07/03/20 180 lb (81.6 kg)  06/29/20 180 lb 12.4 oz (82 kg)  06/15/20 180 lb 12.4 oz (82 kg)  05/31/20 180 lb 11.2 oz (82 kg)  05/29/20 180 lb (81.6 kg)  05/12/20 218 lb (98.9 kg)  05/05/20 218 lb 14.7 oz (99.3 kg)  04/28/20 219 lb (99.3 kg)  04/19/20 194 lb (88 kg)  04/13/20 190 lb 3.2 oz (86.3 kg)   Has lost 10-14 # acutely this year from her baseline after weight loss from Trulicity last summer and fall. Patient and family say this is due to decreased appetite from kidney issues, infection, nausea and vomiting. She had also had surgery for ORIF ankle recently which increases metabolism and had been on Trulicity last fall until December which could have contributed her her weight loss. She likely has moderate protein calorie malnutrition.   She states blood sugars well controled at home recently on her current medication with some low blood sugars in am. Her son who is in high school is cooking for her. Her daughter who is at [facility] is also assisting.     Diabetes Self-Management Education - 07/03/20 1700      Visit Information   Visit Type Follow-up      Health Coping   How would you rate your overall health? Fair      Pre-Education Assessment   Patient understands incorporating nutritional management into lifestyle. Needs Review   daughter asking for information for herself and her brother who care for patient     Complications   Last HgB A1C per patient/outside source 11.4 %    How often do you check your blood sugar? 1-2 times/day    Fasting Blood glucose range (mg/dL) 70-129    Number of hypoglycemic episodes per month 3    Can you tell when your blood sugar is low? Yes    What do you do if  your blood sugar is low? drinks juice    Number of hyperglycemic episodes per week 5    Can you tell when your blood sugar is high? Yes    What do you do if your blood sugar is high? drinks water    Have you had a dilated eye exam in the past 12 months? Yes    Have you had a dental exam in the past 12 months? No    Are you checking your feet? Yes    How many days per week are you checking your feet? 4      Dietary Intake   Breakfast whole milk, bread, cheese    Lunch bread, chicken salad    Dinner whole milk, bread    Beverage(s) water, milk, juice      Exercise   Exercise Type ADL's   stays in bed most of day, has a walker     Patient Education   Previous Diabetes Education Yes (please comment)   here and [facility]   Nutrition management  Role of diet in the treatment of diabetes and the relationship between the three main macronutrients and blood glucose level;Other (comment)   discussed using supplement or smoothie as a meal replacement to be sure she is gettting adequate nutrition and this  would be easiets for children who are going to school and caring for her also     Individualized Goals (developed by patient)   Nutrition Other (comment)   use 1-2 meal replacements or smoothies per day until she is eating better meals     Outcomes   Expected Outcomes Demonstrated interest in learning. Expect positive outcomes    Future DMSE 2 wks    Program Status Completed      Subsequent Visit   Since your last visit have you continued or begun to take your medications as prescribed? Yes    Since your last visit have you had your blood pressure checked? Yes    Is your most recent blood pressure lower, unchanged, or higher since your last visit? Lower    Since your last visit have you experienced any weight changes? Loss    Weight Loss (lbs) 17    Since your last visit, are you checking your blood glucose at least once a day? Yes           Individualized Plan for Diabetes Self-Management  Training:   Learning Objective:  Patient will have a greater understanding of diabetes self-management. Patient education plan is to attend individual and/or group sessions per assessed needs and concerns.   Plan:   There are no Patient Instructions on file for this visit.  Expected Outcomes:  Demonstrated interest in learning. Expect positive outcomes Education material provided: Diabetes Resources If problems or questions, patient to contact team via:  Phone Future DSME appointment: 2 wks  Debera Lat, RD 07/03/2020 5:34 PM.

## 2020-07-03 NOTE — Patient Instructions (Signed)
I will call you with the lab results. I will also touch base with our nutritionist and have her set up an appointment with you. I will have our case manager call you to discuss additional support at home.  Please follow up in one week so we can discuss more of your concerns.

## 2020-07-03 NOTE — Assessment & Plan Note (Addendum)
This encounter was for ED follow up. She was seen in the ED on 3/10 for suprapubic pain. UA was consistent with UTI. She was discharged from the ED with a 7d course of bactrim.  Urine culture grew klebsiella pneumoniae resistant to ampicillin.  In the office today, she is relatively hypotensive in comparison to her prior blood pressure readings in the office. She denies associated symptoms. Her abdominal pain has improved.  Her daughter accompanied her to today's visit and brought along her pills. I did a pill count on her antibiotics which she started on 3/10.  The prescription was for 14 pills and after taking them for four days, she should have only 6 pills left however she has 10, which indicates that she has already missed two days worth of dosing over the past 4 days. She says that she is taking them as indicated however this is not consistent with the pill count. I reiterated to her the importance of making sure to take the antibiotics as prescribed.  Assessment: in light of her non-adherence to the antibiotics and relative hypotension seen in the office today, I am concerned that she could be heading down a sepsis route, although this could also be related to dehydration. She is not tachycardic and is not on a beta-blocker so that is reassuring. I do not think she needs to be admitted today but will re-evaluate that after her labs return.  Plan -will obtain a cbc to look for leukocytosis, lactic acid, BMP and blood cultures. -will need to monitor her symptoms closely to ensure resolution as klebsiella can form emphysematous UTIs which she is high risk for considering her very poorly controlled diabetes. -continue bactrim through 3/17 -return precautions discussed in detail  Addendum: no leukocytosis on CBC. Lactate normal. No growth on blood cultures yet. This is all reassuring. Her glucose was 519 on the BMP and she was instructed to take some insulin by the on call provider last evening. There  have been no significant changes to her renal function. Will continue to defer admission at this time. She was reminded to take her antibiotics appropriately

## 2020-07-03 NOTE — Telephone Encounter (Signed)
  Reason for call:   I placed an outgoing call to [name] at 4:49 PM regarding patient's critical lab value. The call was unsuccessfully due to patient did not answer the phone and did not have a voicemail set up. Therefore, I spoke to the patient's son, Jennifer [name].    We discussed the patient's high blood sugar and creatinine level.  I told him I was unable to reach his mother regarding these concerns.  He stated he would call her to check on her.    I counseled him on signs and symptoms of hyperglycemia to be aware of including fatigue, weakness, presyncopal symptoms, polyuria, polydipsia, altered mental status.    Furthermore I explained to him the importance of taking her home insulin to help bring her sugars down to a normal level.  I explained the importance of rechecking her blood sugar frequently after giving herself the insulin to make sure that the blood sugar comes down appropriately.  I explained that if she is experiencing any symptoms of hyperglycemia that is important for her to come to the hospital for treatment.  I explained the serious nature of her hyperglycemia and how this could result in a complication that increases her chance of morbidity mortality. I encouraged them to go to the emergency room for further evaluation if she is symptomatic or if her blood sugar does not go done with her home novolog.   I counseled him regarding avoiding low blood sugars  I gave him my cell phone number and the clinic phone number to call once he is able to get ahold of his mother.     Assessment/ Plan:   Plan as stated above - give home novolog.  If symptomatic, then go to ED  As always, pt was advised that if symptoms worsen or new symptoms arise, they should go to an urgent care facility or to to ER for further evaluation.    Lawerance Cruel, D.O.  Internal Medicine Resident, PGY-2 [facility]  Pager: [phone] 6:50 PM, 07/03/2020

## 2020-07-03 NOTE — Progress Notes (Signed)
Office Visit   [name], female    DOB: 10/04/1970, 50 y.o.   MRN: [id number]  Subjective:  CC: hospital follow up for suprapubic pain  HPI 50 y.o. presents today for a post-hospital visit. Please refer to problem based charting for assessment and plan.      ACTIVE MEDICATIONS   Outpatient Medications Prior to Visit  Medication Sig Dispense Refill  . ACCU-CHEK GUIDE test strip CHECK BLOOD SUGAR 4 TIMES PER DAY 150 strip 12  . Accu-Chek FastClix Lancets MISC Check blood sugar 4 times a day 200 each 7  . acetaminophen (TYLENOL) 500 MG tablet Take 500 mg by mouth every 6 (six) hours as needed for mild pain.    Marland Kitchen apixaban (ELIQUIS) 5 MG TABS tablet Take 1 tablet (5 mg total) by mouth 2 (two) times daily. 60 tablet 3  . atorvastatin (LIPITOR) 40 MG tablet Take 1 tablet (40 mg total) by mouth daily. 90 tablet 3  . Blood Glucose Monitoring Suppl (ACCU-CHEK GUIDE) w/Device KIT 1 each by Does not apply route 4 (four) times daily. 1 kit 1  . diclofenac Sodium (VOLTAREN) 1 % GEL Apply 2 g topically 4 (four) times daily. 240 g 0  . ferrous sulfate 325 (65 FE) MG tablet Take 1 tablet (325 mg total) by mouth every Monday, Wednesday, and Friday for 14 days. 30 tablet 3  . insulin aspart protamine - aspart (NOVOLOG MIX 70/30 FLEXPEN) (70-30) 100 UNIT/ML FlexPen Inject 0.15-0.3 mLs (15-30 Units total) into the skin 2 (two) times daily. 30 unit morning, 15 unit supper 15 mL 3  . Insulin Pen Needle (B-D UF III MINI PEN NEEDLES) 31G X 5 MM MISC Use pen needle daily for injections 100 each 2  . metFORMIN (GLUCOPHAGE-XR) 500 MG 24 hr tablet Take 1 tablet (500 mg total) by mouth daily with breakfast. 90 tablet 3  . sulfamethoxazole-trimethoprim (BACTRIM DS) 800-160 MG tablet Take 1 tablet by mouth 2 (two) times daily for 7 days. 14 tablet 0   No facility-administered medications prior to visit.     ROS  Review of Systems  Constitutional: Positive for activity change, appetite change,  fatigue and unexpected weight change. Negative for chills and fever.  Respiratory: Negative for cough, choking and shortness of breath.   Gastrointestinal: Positive for nausea and vomiting. Negative for abdominal pain, blood in stool, constipation and diarrhea.  Genitourinary: Negative for decreased urine volume, dysuria, flank pain, frequency and urgency.  Musculoskeletal: Positive for gait problem.  Neurological: Negative for dizziness, syncope, light-headedness and headaches.  Psychiatric/Behavioral: Positive for dysphoric mood. Negative for self-injury and suicidal ideas. The patient is not nervous/anxious.     Objective:   BP 98/62 (BP Location: Left Arm, Patient Position: Sitting, Cuff Size: Normal)   Pulse 83   Temp (!) 97.5 F (36.4 C) (Oral)   Ht '5\' 6"'  (1.676 m)   Wt 180 lb (81.6 kg)   LMP 08/21/2016 (Exact Date)   SpO2 99%   BMI 29.05 kg/m  Wt Readings from Last 3 Encounters:  07/03/20 180 lb (81.6 kg)  06/29/20 180 lb 12.4 oz (82 kg)  06/15/20 180 lb 12.4 oz (82 kg)   BP Readings from Last 3 Encounters:  07/03/20 98/62  06/29/20 (!) 165/78  06/18/20 125/68   Physical exam General: chronically ill appearing female who appears older than her stated age, sitting in a wheelchair Cardiac: RRR, extremities warm Pulm: breathing comfortably on room air. No cough GI: no abdominal distension. bs  active. No pain on palpation. No suprapubic pain on palpation. No CVA tenderness. Psych: difficult to accurately assess through translation however denies thoughts of harming herself intentionally.  Health Maintenance:   Health Maintenance  Topic Date Due  . Hepatitis C Screening  Never done  . COLONOSCOPY (Pts 45-52yr Insurance coverage will need to be confirmed)  Never done  . URINE MICROALBUMIN  01/31/2019  . INFLUENZA VACCINE  11/21/2019  . TETANUS/TDAP  02/01/2020  . COVID-19 Vaccine (2 - Moderna 3-dose series) 06/14/2020  . HEMOGLOBIN A1C  09/13/2020  . OPHTHALMOLOGY  EXAM  01/31/2021  . FOOT EXAM  04/19/2021  . PAP SMEAR-Modifier  05/20/2021  . PNEUMOCOCCAL POLYSACCHARIDE VACCINE AGE 39-64 HIGH RISK  Completed  . HIV Screening  Completed  . HPV VACCINES  Aged Out     Assessment & Plan:   Problem List Items Addressed This Visit      Endocrine   Type II diabetes mellitus (HCC) (Chronic)    Glucose was severely elevated on BMP from today's visit--519. Our on call provider was notified of the critical result and she was called and instructed to take insulin and drink fluids. Unfortunately, this is not all that unusual for her and is related to non-adherence to insulin and metformin. She has routinely had this addressed without a significant change in behavior. I had a lengthy discussion at today's visit with the patient, daughter and son (via phone) regarding this circumfrential pattern of uncontrolled diabetes, infections, and n/v. Unfortunately, the family members that she resides with are not home during the day (which is a whole separate concern) so they are unable to administer the insulin and be checking her blood sugars. I brought up PCS and additional home care services that may be necessary if this is the case. I have sent a message to our clinic case manager to look into this further.        Genitourinary   UTI (urinary tract infection) - Primary    This encounter was for ED follow up. She was seen in the ED on 3/10 for suprapubic pain. UA was consistent with UTI. She was discharged from the ED with a 7d course of bactrim.  Urine culture grew klebsiella pneumoniae resistant to ampicillin.  In the office today, she is relatively hypotensive in comparison to her prior blood pressure readings in the office. She denies associated symptoms. Her abdominal pain has improved.  Her daughter accompanied her to today's visit and brought along her pills. I did a pill count on her antibiotics which she started on 3/10.  The prescription was for 14 pills and  after taking them for four days, she should have only 6 pills left however she has 10, which indicates that she has already missed two days worth of dosing over the past 4 days. She says that she is taking them as indicated however this is not consistent with the pill count. I reiterated to her the importance of making sure to take the antibiotics as prescribed.  Assessment: in light of her non-adherence to the antibiotics and relative hypotension seen in the office today, I am concerned that she could be heading down a sepsis route, although this could also be related to dehydration. She is not tachycardic and is not on a beta-blocker so that is reassuring. I do not think she needs to be admitted today but will re-evaluate that after her labs return.  Plan -will obtain a cbc to look for leukocytosis, lactic acid, BMP  and blood cultures. -will need to monitor her symptoms closely to ensure resolution as klebsiella can form emphysematous UTIs which she is high risk for considering her very poorly controlled diabetes. -continue bactrim through 3/17 -return precautions discussed in detail  Addendum: no leukocytosis on CBC. Lactate normal. No growth on blood cultures yet. This is all reassuring. Her glucose was 519 on the BMP and she was instructed to take some insulin by the on call provider last evening. There have been no significant changes to her renal function. Will continue to defer admission at this time. She was reminded to take her antibiotics appropriately      Relevant Orders   CBC with Diff (Completed)   BMP w Anion Gap (STAT/Sunquest-performed on-site) (Completed)   Lactic acid, plasma (Completed)   Culture, Blood (Completed)     Other   Adult failure to thrive    I had taken care of Jennifer [name] in the hospital last year. Upon entering her room today, I immediately noted that her appeared to have lost a bit of weight.  On review of her recent weights, I see that she has lost ~40#  since January which is very concerning to me. Furthermore, her functional status has continued to decline. She now is unable to get out of bed to use the bathroom independently and spends the entirety of the day in bed.  She endorses symptoms of depression including troubles sleeping, anhedonia, fatigue. Assessment: TSH was checked back in January. TSH was normal however fT4 was slightly elevated at 1.4 (ULN 1.2). Albumin 2.2. B12 and folate were normal in Nov 2021. Plan -I spoke with [name] this afternoon. I think that it would be beneficial for her to due a thorough nutrition evaluation in addition to diabetes education -will also start treatment for depression. She may benefit appetite stimulation with mirtazepine as well.  -based on new guidelines, she should also undergo a colonoscopy to evaluate for malignancy. Unfortunately, I do not think she is medically stable enough to tolerate the procedure at this time. She is also due for a mammogram for breast cancer screening which will need to be addressed at her follow up visit. -she will follow up after her visit with [name] at which time we can consider the degree of malnutrition and whether or not vitamin and mineral testing is indicated -I would also consider starting metaclopamide -Recheck TSH, fT4 at time of follow up        Follow up in 2w   Pt discussed with Dr. Venetia Maxon, MD Internal Medicine Resident PGY-2 [facility] Pager: [phone] 07/04/2020 11:55 AM

## 2020-07-03 NOTE — Patient Instructions (Addendum)
Thank you for your visit today!  We talked about:   Trying to eat well balanced meals/snacks during her recovery   Goals to work on:   Drink 1/2 to 1 meal replacement or smoothie containing fruits, dairy and vegetables for meals or snacks if you have a decreased appetite or are unable to eat your regular foods/meals.   Please feel free to call me anytime.  [name] [phone]

## 2020-07-04 ENCOUNTER — Telehealth: Payer: Self-pay | Admitting: Internal Medicine

## 2020-07-04 ENCOUNTER — Other Ambulatory Visit: Payer: Self-pay | Admitting: Internal Medicine

## 2020-07-04 ENCOUNTER — Encounter: Payer: Self-pay | Admitting: Internal Medicine

## 2020-07-04 DIAGNOSIS — E11319 Type 2 diabetes mellitus with unspecified diabetic retinopathy without macular edema: Secondary | ICD-10-CM

## 2020-07-04 DIAGNOSIS — R627 Adult failure to thrive: Secondary | ICD-10-CM | POA: Insufficient documentation

## 2020-07-04 DIAGNOSIS — R634 Abnormal weight loss: Secondary | ICD-10-CM | POA: Insufficient documentation

## 2020-07-04 DIAGNOSIS — Z794 Long term (current) use of insulin: Secondary | ICD-10-CM

## 2020-07-04 NOTE — Assessment & Plan Note (Addendum)
Glucose was severely elevated on BMP from today's visit--519. AG 11 so not DKA. Our on call provider was notified of the critical result and she was called and instructed to take insulin and drink fluids.  Unfortunately, this is not all that unusual for her and is related to non-adherence to insulin and metformin. She has routinely had this addressed without a significant change in behavior. I had a lengthy discussion at today's visit with the patient, daughter and son (via phone) regarding this circumfrential pattern of uncontrolled diabetes, infections, and n/v. Unfortunately, the family members that she resides with are not home during the day (which is a whole separate concern) so they are unable to administer the insulin and be checking her blood sugars. I brought up PCS and additional home care services that may be necessary if this is the case. I have sent a message to our clinic case manager to look into this further.  I called her the day following this encounter and instructed her to call us if glucose does not improve with insulin and fluids.

## 2020-07-04 NOTE — Progress Notes (Signed)
Internal Medicine Clinic Attending  Case discussed with Dr. Nguyen  At the time of the visit.  We reviewed the resident's history and exam and pertinent patient test results.  I agree with the assessment, diagnosis, and plan of care documented in the resident's note. 

## 2020-07-04 NOTE — Assessment & Plan Note (Addendum)
I had taken care of Jennifer [name] in the hospital last year. Upon entering her room today, I immediately noted that her appeared to have lost a bit of weight.  On review of her recent weights, I see that she has lost ~40# since January which is very concerning to me.  Assessment: I do suspect that the weight loss is multifactorial involving poor appetite, muscle wasting due to inactivity, and worsening glycemic control. Poor appetite is likely multifactorial including gastroparesis, frequent n/v from hyperglycemia, and untreated depression (see separate problem) Plan -I spoke with Jennifer [name] this afternoon. I think that it would be beneficial for her to due a thorough nutrition evaluation in addition to diabetes education -will also start treatment for depression. She may benefit appetite stimulation with mirtazepine as well.  -based on new guidelines, she should also undergo a colonoscopy to evaluate for malignancy. Unfortunately, I do not think she is medically stable enough to tolerate the procedure at this time. She is also due for a mammogram for breast cancer screening which will need to be addressed at her follow up visit. -she will follow up after her visit with Jennifer [name] at which time we can consider the degree of malnutrition and whether or not vitamin and mineral testing is indicated -I would also consider starting metaclopamide

## 2020-07-04 NOTE — Telephone Encounter (Signed)
I followed up with pt's daughter regarding her blood sugar. Her daughter notes that her blood sugar responded well to the insulin and was down to 238 this morning. They asked about how to control her blood sugars better. I said the first step will be to make sure she is taking her insulin regularly, as I was not given this impression based on yesterday's discussion. They also asked about starting an SGLT2 which I explained wouldn't not be a good idea in her clinical context.  I advised them to record the times she eats, what she eats, and her blood sugars and then bring this into an appointment so we can re-evaluate. I explained that, in a state of infection, blood sugars are typically higher than baseline so she may need to finish recovering from this infection prior to [place] doing that.   I also updated them on the lab results which were reassuring and that there has been no growth on blood cultures at this time.  Return precautions discussed.   Mitzi Hansen, MD Internal Medicine Resident PGY-2 [facility] Pager: [phone] 07/04/2020 2:58 PM

## 2020-07-04 NOTE — Assessment & Plan Note (Addendum)
I had taken care of Ms. [place] in the hospital last year. Upon entering her room today, I immediately noted that her appeared to have lost a bit of weight.  On review of her recent weights, I see that she has lost ~40# since January which is very concerning to me. Furthermore, her functional status has continued to decline. She now is unable to get out of bed to use the bathroom independently and spends the entirety of the day in bed.  She endorses symptoms of depression including troubles sleeping, anhedonia, fatigue. Assessment: TSH was checked back in January. TSH was normal however fT4 was slightly elevated at 1.4 (ULN 1.2). Albumin 2.2. B12 and folate were normal in Nov 2021. Plan -I spoke with [name] this afternoon. I think that it would be beneficial for her to due a thorough nutrition evaluation in addition to diabetes education -will also start treatment for depression. She may benefit appetite stimulation with mirtazepine as well.  -based on new guidelines, she should also undergo a colonoscopy to evaluate for malignancy. Unfortunately, I do not think she is medically stable enough to tolerate the procedure at this time. She is also due for a mammogram for breast cancer screening which will need to be addressed at her follow up visit. -she will follow up after her visit with [name] at which time we can consider the degree of malnutrition and whether or not vitamin and mineral testing is indicated -I would also consider starting metaclopamide -Recheck TSH, fT4 at time of follow up

## 2020-07-08 LAB — CULTURE, BLOOD (SINGLE)
Culture: NO GROWTH
Special Requests: ADEQUATE

## 2020-07-10 NOTE — Progress Notes (Addendum)
Internal Medicine Clinic Attending  Case discussed with Dr. Darrick Meigs  At the time of the visit.  We reviewed the resident's history and exam and pertinent patient test results.  I agree with the assessment, diagnosis, and plan of care documented in the resident's note with the following corrections/ additions.  I feel her weight loss is likely secondary to her uncontrolled diabetes and non compliance with insulin therapy. I would recommend focusing on DM treatment adherence prior to adding other medications like an appetite stimulant. Otherwise agree with routine age appropriate cancer screening.

## 2020-07-13 ENCOUNTER — Telehealth: Payer: Self-pay | Admitting: Dietician

## 2020-07-13 NOTE — Telephone Encounter (Signed)
Called patient to see if she could come in for a professional CGM per Dr. Darrick Meigs. Her daughter says she is in [place] with her brother and is doing better there. She suggested calling him if needed [phone] Ahmed her brother in [place].

## 2020-07-16 ENCOUNTER — Other Ambulatory Visit: Payer: Self-pay | Admitting: Internal Medicine

## 2020-07-21 NOTE — Telephone Encounter (Signed)
none

## 2020-07-29 ENCOUNTER — Other Ambulatory Visit: Payer: Self-pay | Admitting: Internal Medicine

## 2020-08-08 ENCOUNTER — Other Ambulatory Visit: Payer: Self-pay | Admitting: Dietician

## 2020-08-08 ENCOUNTER — Encounter: Payer: Self-pay | Admitting: Dietician

## 2020-08-08 ENCOUNTER — Ambulatory Visit (INDEPENDENT_AMBULATORY_CARE_PROVIDER_SITE_OTHER): Payer: Medicaid Other | Admitting: Dietician

## 2020-08-08 ENCOUNTER — Encounter: Payer: Medicaid Other | Admitting: Dietician

## 2020-08-08 DIAGNOSIS — E11319 Type 2 diabetes mellitus with unspecified diabetic retinopathy without macular edema: Secondary | ICD-10-CM | POA: Diagnosis not present

## 2020-08-08 DIAGNOSIS — Z713 Dietary counseling and surveillance: Secondary | ICD-10-CM | POA: Diagnosis not present

## 2020-08-08 DIAGNOSIS — Z794 Long term (current) use of insulin: Secondary | ICD-10-CM

## 2020-08-08 LAB — GLUCOSE, CAPILLARY: Glucose-Capillary: 240 mg/dL — ABNORMAL HIGH (ref 70–99)

## 2020-08-08 NOTE — Patient Instructions (Addendum)
Please take 30 units of insulin before breakfast And 15 units before dinner/evening meal.  Usually taking insulin 15 minutes before eating helps control the blood sugar - prevents high and then low blood sugar.  Use the share icon on her dexcom   suggest entering the share code to share with our office.   [name] [phone]

## 2020-08-08 NOTE — Progress Notes (Signed)
Diabetes Self-Management Education  Visit Type: Follow-up  Appt. Start Time: 1530 Appt. End Time: 1630  08/08/2020  [name], identified by name and date of birth, is a 50 y.o. female with a diagnosis of Diabetes:  .Type 2 She is here with her son Jennifer [name] today. He translates  ASSESSMENT  Last menstrual period 08/21/2016.  She has a low blood sugar today upon awakening of 67 a five hours later is was 252. She took  20 units of 70/30 this 10 am instead of 30 units. Last night she took 30 units  with supper instead of 15 units 8 PM.  Dexcom G6 Personal CGM Training and start Jennifer [name] and her son Jennifer [name] were educated about the following: -Getting to know device    (Phone programmed ) -Setting up device (high alert  250  , low alert 85  ) -Rise alert set Not set  -Fall alert set - not set  -Setting alert profile -Inserting sensor ( on abdomen in warm up period when they left- WNL) -Calibrating- none required for G6 -Ending sensor session -clarity information .   Patient has  my contact information.      Diabetes Self-Management Education - 08/08/20 1600      Visit Information   Visit Type Follow-up      Health Coping   How would you rate your overall health? Poor   has a headache and trouble with her ankle that she broke- she says it feels like she is walking on a ballon filled with water and has trouble keeping her shoe on.     Dietary Intake   Breakfast milk, eggs and half a pita      Patient Education   Medications Reviewed patients medication for diabetes, action, purpose, timing of dose and side effects.    Monitoring Other (comment)   assisted her with placing Dexcom G6, son programmed hwer phone and understands how to use the share feature     Individualized Goals (developed by patient)   Medications take my medication as prescribed      Outcomes   Expected Outcomes Demonstrated interest in learning. Expect positive outcomes    Future DMSE 2  wks    Program Status Re-entered      Subsequent Visit   Since your last visit have you continued or begun to take your medications as prescribed? Yes   taking incorrect amounts of insulin and I think she is taking the insulin after eating at times   Since your last visit have you experienced any weight changes? No change   she did not weigh today because she felt unstable on her feet   Since your last visit, are you checking your blood glucose at least once a day? Yes   3.3 times a day per her meter report          Individualized Plan for Diabetes Self-Management Training:   Learning Objective:  Patient will have a greater understanding of diabetes self-management. Patient education plan is to attend individual and/or group sessions per assessed needs and concerns.   Plan:   Patient Instructions  Please take 30 units of insulin before breakfast And 15 units before dinner/evening meal.  Usually taking insulin 15 minutes before eating helps control the blood sugar - prevents high and then low blood sugar.  Use the share icon on her dexcom   suggest entering the share code to share with our office.   Jennifer [name] [phone]  Expected Outcomes:  Demonstrated interest in learning. Expect positive outcomes  Education material provided: Diabetes Resources  If problems or questions, patient to contact team via:  Phone  Future DSME appointment: 2 wks  Jennifer [name], RD 08/08/2020 4:51 PM.

## 2020-08-08 NOTE — Progress Notes (Signed)
Referral for professional Continuous glucose monitoring  per Dr. Darrick Meigs

## 2020-08-08 NOTE — Telephone Encounter (Signed)
Patient and son request prescription for Dexcom G6 CGM

## 2020-08-09 ENCOUNTER — Encounter: Payer: Self-pay | Admitting: Internal Medicine

## 2020-08-09 ENCOUNTER — Ambulatory Visit (INDEPENDENT_AMBULATORY_CARE_PROVIDER_SITE_OTHER): Payer: Medicaid Other | Admitting: Internal Medicine

## 2020-08-09 ENCOUNTER — Other Ambulatory Visit: Payer: Self-pay

## 2020-08-09 VITALS — BP 122/67 | HR 85 | Temp 98.6°F | Ht 66.0 in | Wt 164.3 lb

## 2020-08-09 DIAGNOSIS — E11319 Type 2 diabetes mellitus with unspecified diabetic retinopathy without macular edema: Secondary | ICD-10-CM | POA: Diagnosis not present

## 2020-08-09 DIAGNOSIS — R739 Hyperglycemia, unspecified: Secondary | ICD-10-CM

## 2020-08-09 DIAGNOSIS — Z794 Long term (current) use of insulin: Secondary | ICD-10-CM

## 2020-08-09 DIAGNOSIS — I951 Orthostatic hypotension: Secondary | ICD-10-CM | POA: Diagnosis not present

## 2020-08-09 DIAGNOSIS — E1142 Type 2 diabetes mellitus with diabetic polyneuropathy: Secondary | ICD-10-CM

## 2020-08-09 DIAGNOSIS — N390 Urinary tract infection, site not specified: Secondary | ICD-10-CM | POA: Diagnosis not present

## 2020-08-09 LAB — BASIC METABOLIC PANEL
Anion gap: 8 (ref 5–15)
BUN: 29 mg/dL — ABNORMAL HIGH (ref 6–20)
CO2: 24 mmol/L (ref 22–32)
Calcium: 10 mg/dL (ref 8.9–10.3)
Chloride: 105 mmol/L (ref 98–111)
Creatinine, Ser: 1.38 mg/dL — ABNORMAL HIGH (ref 0.44–1.00)
GFR, Estimated: 47 mL/min — ABNORMAL LOW (ref >60)
Glucose, Bld: 259 mg/dL — ABNORMAL HIGH (ref 70–99)
Potassium: 4.3 mmol/L (ref 3.5–5.1)
Sodium: 137 mmol/L (ref 135–145)

## 2020-08-09 LAB — POCT URINALYSIS DIPSTICK
Bilirubin, UA: NEGATIVE
Glucose, UA: POSITIVE — AB
Ketones, UA: NEGATIVE
Nitrite, UA: NEGATIVE
Protein, UA: POSITIVE — AB
Spec Grav, UA: 1.015 (ref 1.010–1.025)
Urobilinogen, UA: 0.2 E.U./dL
pH, UA: 5 (ref 5.0–8.0)

## 2020-08-09 LAB — CBC
HCT: 35.9 % — ABNORMAL LOW (ref 36.0–46.0)
Hemoglobin: 10.8 g/dL — ABNORMAL LOW (ref 12.0–15.0)
MCH: 26.5 pg (ref 26.0–34.0)
MCHC: 30.1 g/dL (ref 30.0–36.0)
MCV: 88.2 fL (ref 80.0–100.0)
Platelets: 170 10*3/uL (ref 150–400)
RBC: 4.07 MIL/uL (ref 3.87–5.11)
RDW: 15.5 % (ref 11.5–15.5)
WBC: 4.3 10*3/uL (ref 4.0–10.5)
nRBC: 0 % (ref 0.0–0.2)

## 2020-08-09 LAB — GLUCOSE, CAPILLARY: Glucose-Capillary: 245 mg/dL — ABNORMAL HIGH (ref 70–99)

## 2020-08-09 MED ORDER — SODIUM CHLORIDE 0.9 % IV BOLUS: 1000.0000 mL | Freq: Once | INTRAVENOUS | Status: DC

## 2020-08-09 MED ORDER — DEXCOM G6 TRANSMITTER MISC: 3 refills | Status: DC

## 2020-08-09 MED ORDER — DEXCOM G6 SENSOR MISC
3 refills | Status: DC
Start: 2020-08-09 — End: 2020-09-27

## 2020-08-09 MED ORDER — DEXCOM G6 RECEIVER DEVI: 0 refills | Status: DC

## 2020-08-09 MED ORDER — GABAPENTIN 300 MG PO CAPS: 300.0000 mg | ORAL_CAPSULE | Freq: Every day | ORAL | 3 refills | Status: DC

## 2020-08-09 NOTE — Patient Instructions (Addendum)
Ms Jennifer [name],  It was a pleasure seeing you in clinic. Today we discussed:   Frequent urination:  This is likely secondary to your high blood sugars. Please continue to take your medications as directed. Continue taking your insulin as prescribed.  Low blood pressure: Your blood pressure was low initially due to dehydration. Please continue to drink enough fluids.   Foot pain: This is likely secondary to your high blood sugars and due to your nerves. I have started you on a medication called gabapentin. Take this nightly.   If you have any questions or concerns, please call our clinic at [phone] between 9am-5pm and after hours call [phone] and ask for the internal medicine resident on call. If you feel you are having a medical emergency please call 911.   Thank you, we look forward to helping you remain healthy!  If you have not gotten the COVID vaccine, I recommend doing so:  You may get it at your local [facility] or [facility] OR To schedule an appointment for a COVID vaccine or be added to the vaccine wait list: Go to [email or web address]   OR Go to [email or web address]                  OR Call [phone]                                     OR Call [phone] and select Option 2

## 2020-08-09 NOTE — Progress Notes (Signed)
   CC: increased urinaryfrequency   HPI:  [name] is a 50 y.o. female with PMHx as stated below presenting for evaluation of urinary frequency. She is accompanied by her son who is assisting with translation. She notes that she was initially diagnosed with a UTI one month ago and was prescribed antibiotics which she has completed. She notes persistent increased urinary frequency but denies any dysuria, abdominal pain or flank pain. She denies any fevers or chills. Please see problem based charting for complete assessment and plan.   Past Medical History:  Diagnosis Date  . Anemia, iron deficiency   . Atrial fibrillation ([place])   . Blindness of left eye   . COVID-19 virus infection 06/15/2020  . Decreased visual acuity    Left eye  . Depression   . 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 Hair loss   . History of fracture of clavicle 05/18/2015  . Hyperlipidemia   . Hypertension   . Iron deficiency anemia 05/13/2013  . Pain, dental 08/19/2018   Tooth pain/facial swelling: has poor dentition at baseline, history of dental abscess.  She has not seen a dentist in about one year.  Dentist is on [address].  No fevers chills or systemic symptoms.  Diabetes has been poorly controlled for some time.  She said it has improved recently averaging around 140.  Called the dentist said they would not see patients until May 14th but the urgency o  . Pap smear abnormality of cervix with LGSIL   . Routine/ritual circumcision   . Type II diabetes mellitus ([place])   . Uveitis    Review of Systems:  Negative except as stated in HPI.  Physical Exam:  Vitals:   08/09/20 1424 08/09/20 1432  BP: (!) 80/48 100/80  Pulse: 85   Temp: 98.6 F (37 C)   TempSrc: Oral   SpO2: 100%   Weight: 164 lb 4.8 oz (74.5 kg)   Height: 5\' 6"  (1.676 m)    Physical Exam   Constitutional: Chronically ill appearing female; no acute distress  HENT: Normocephalic and atraumatic, EOMI Cardiovascular: Normal rate, regular rhythm, S1 and S2 present, no murmurs, rubs, gallops.  Distal pulses intact Respiratory: No respiratory distress Effort is normal.  Lungs are clear to auscultation bilaterally. GI: Nondistended, soft, nontender to palpation, active bowel sounds Musculoskeletal: Normal bulk and tone.  Neurological: Is alert and oriented x4, no apparent focal deficits noted. Skin: Warm and dry.  No rash, erythema, lesions noted.  Assessment & Plan:   See Encounters Tab for problem based charting.  Patient discussed with Dr. Jimmye Norman

## 2020-08-14 ENCOUNTER — Telehealth: Payer: Self-pay | Admitting: *Deleted

## 2020-08-14 NOTE — Assessment & Plan Note (Signed)
Patient with history of poorly controlled diabetes mellitus expressing that she feels "pins and needles" in bilateral feet (R>L) and it feels like she is walking on a balloon when she tries to walk. She was previously on gabapentin but this was discontinued in the past as it was thought that her symptoms were secondary to chronic venous insufficiency as it seemed to improve with compression stockings.  No lower extremity swelling noted at this time. I suspect her symptoms are likely secondary diabetic neuropathy at this time due to persistent hyperglycemia.   Plan: Start gabapentin 300mg  qHS, uptitrate as needed

## 2020-08-14 NOTE — Assessment & Plan Note (Signed)
Patient is presenting with urinary frequency. She denies any dysuria, foul smelling urine, or change in color of urine. She denies any suprapubic or flank pain or fevers/chills at home. She has completed her bactrim at this time. Examination is benign without suprapubic or flank tenderness on exam. Urinalysis did have some leukocytes; however, I suspect that her urinary frequency is secondary to her uncontrolled diabetes at this time.   Plan: Continue to monitor off of antibiotics

## 2020-08-14 NOTE — Telephone Encounter (Signed)
Information was called to [place].  For PA for Dexcom G6 Transmitter, Engineer, civil (consulting).  All were approved 08/14/2020 thru 02/13/2021.  Receiver - [place] [phone].  Transmitter- [place] [id number] Sensor PA [id number].  Sander Nephew, RN 08/14/2020 9:45 AM.

## 2020-08-14 NOTE — Assessment & Plan Note (Addendum)
Jennifer [name] was noted to be hypotensive on presentation with BP 80/48, repeat 100/80. She denies any headache, lightheadedness/dizziness, but did endorse fatigue. Denied any fevers/chills, dysuria, flank pain, suprapubic pain, abdominal pain, new rash, chest pain, shortness of breath, headaches, lightheadedness/dizziness, etc. She is afebrile and otherwise does not appear to be in acute distress.  Her BP improved with oral fluid challenge to 122/67. Patient encouraged to maintain adequate oral hydration. She and family expressed understanding.   Plan:  Continue to monitor and encourage adequate oral intake  Suspect may have degree of autonomic dysfunction in setting of long-standing poorly controlled diabetes Not currently on antihypertensives; if frequent episodes, may consider midodrine

## 2020-08-14 NOTE — Assessment & Plan Note (Signed)
Jennifer [name] has a history of poorly controlled diabetes in setting of medication non-adherence. This may be in part due to lack of understanding on behalf of the patient with checking her blood sugars routinely and administering her insulin. Patient was seen by [name] yesterday and fitted for dexcom continuous monitoring. Her son will help her with this.  On review of her CBG's, most of her BG readings ranged in 250-300's with her morning glucose of 170s. However, over the past several days, patient's BG's have been 400-500. Patient's son notes that this is because of her recent trip to [place] to visit her other son and that she is also worried because her grandson is in the ICU for near-drowning. As a result, patient has not been able to take care of her diabetes as much. Today, patient endorses increased urinary frequency over the past several days. Glucose elevated to 259 on labs with normal anion gap, no leukocytosis. UA did have proteinuria and glucosuria.   Plan: Encouraged adherence with her metformin and insulin regimen She is currently taking Novolin 70/30, 30U in AM and 15U in PM Will hold off on adjusting her insulin regimen at this time and have her follow up in 1 week with her DexCom readings for insulin adjustment

## 2020-08-22 ENCOUNTER — Encounter: Payer: Self-pay | Admitting: Dietician

## 2020-08-22 ENCOUNTER — Ambulatory Visit (INDEPENDENT_AMBULATORY_CARE_PROVIDER_SITE_OTHER): Payer: Medicaid Other | Admitting: Student

## 2020-08-22 ENCOUNTER — Ambulatory Visit: Payer: Medicaid Other | Admitting: Dietician

## 2020-08-22 ENCOUNTER — Other Ambulatory Visit: Payer: Self-pay

## 2020-08-22 ENCOUNTER — Encounter: Payer: Self-pay | Admitting: Student

## 2020-08-22 VITALS — BP 106/73 | HR 85 | Temp 98.2°F | Ht 66.0 in | Wt 159.0 lb

## 2020-08-22 DIAGNOSIS — S82851D Displaced trimalleolar fracture of right lower leg, subsequent encounter for closed fracture with routine healing: Secondary | ICD-10-CM | POA: Diagnosis present

## 2020-08-22 DIAGNOSIS — Z794 Long term (current) use of insulin: Secondary | ICD-10-CM | POA: Diagnosis not present

## 2020-08-22 DIAGNOSIS — I959 Hypotension, unspecified: Secondary | ICD-10-CM | POA: Diagnosis not present

## 2020-08-22 DIAGNOSIS — R29898 Other symptoms and signs involving the musculoskeletal system: Secondary | ICD-10-CM

## 2020-08-22 DIAGNOSIS — E11319 Type 2 diabetes mellitus with unspecified diabetic retinopathy without macular edema: Secondary | ICD-10-CM

## 2020-08-22 DIAGNOSIS — F32A Depression, unspecified: Secondary | ICD-10-CM | POA: Diagnosis not present

## 2020-08-22 LAB — COMPREHENSIVE METABOLIC PANEL
ALT: 13 U/L (ref 0–44)
AST: 20 U/L (ref 15–41)
Albumin: 3.5 g/dL (ref 3.5–5.0)
Alkaline Phosphatase: 77 U/L (ref 38–126)
Anion gap: 8 (ref 5–15)
BUN: 29 mg/dL — ABNORMAL HIGH (ref 6–20)
CO2: 28 mmol/L (ref 22–32)
Calcium: 10 mg/dL (ref 8.9–10.3)
Chloride: 99 mmol/L (ref 98–111)
Creatinine, Ser: 1.43 mg/dL — ABNORMAL HIGH (ref 0.44–1.00)
GFR, Estimated: 45 mL/min — ABNORMAL LOW (ref >60)
Glucose, Bld: 318 mg/dL — ABNORMAL HIGH (ref 70–99)
Potassium: 5 mmol/L (ref 3.5–5.1)
Sodium: 135 mmol/L (ref 135–145)
Total Bilirubin: 0.1 mg/dL — ABNORMAL LOW (ref 0.3–1.2)
Total Protein: 7.8 g/dL (ref 6.5–8.1)

## 2020-08-22 LAB — CBC WITH DIFFERENTIAL/PLATELET
Abs Immature Granulocytes: 0.01 10*3/uL (ref 0.00–0.07)
Basophils Absolute: 0.1 10*3/uL (ref 0.0–0.1)
Basophils Relative: 1 %
Eosinophils Absolute: 0.1 10*3/uL (ref 0.0–0.5)
Eosinophils Relative: 3 %
HCT: 37.6 % (ref 36.0–46.0)
Hemoglobin: 11.7 g/dL — ABNORMAL LOW (ref 12.0–15.0)
Immature Granulocytes: 0 %
Lymphocytes Relative: 46 %
Lymphs Abs: 2.6 10*3/uL (ref 0.7–4.0)
MCH: 27.2 pg (ref 26.0–34.0)
MCHC: 31.1 g/dL (ref 30.0–36.0)
MCV: 87.4 fL (ref 80.0–100.0)
Monocytes Absolute: 0.4 10*3/uL (ref 0.1–1.0)
Monocytes Relative: 6 %
Neutro Abs: 2.5 10*3/uL (ref 1.7–7.7)
Neutrophils Relative %: 44 %
Platelets: 180 10*3/uL (ref 150–400)
RBC: 4.3 MIL/uL (ref 3.87–5.11)
RDW: 14.2 % (ref 11.5–15.5)
WBC: 5.7 10*3/uL (ref 4.0–10.5)
nRBC: 0 % (ref 0.0–0.2)

## 2020-08-22 MED ORDER — METFORMIN HCL ER 500 MG PO TB24: 500.0000 mg | ORAL_TABLET | Freq: Two times a day (BID) | ORAL | 3 refills | Status: DC

## 2020-08-22 NOTE — Progress Notes (Signed)
Internal Medicine Clinic Attending ° °Case discussed with Dr. Aslam  At the time of the visit.  We reviewed the resident’s history and exam and pertinent patient test results.  I agree with the assessment, diagnosis, and plan of care documented in the resident’s note.  °

## 2020-08-22 NOTE — Assessment & Plan Note (Signed)
On exam, the ankle appears well-healed.  Her gait is poor she does not like putting weight on the ankle.  She is getting around at home using a walker, but has a hard time doing activities outside of the home.  Has not had physical therapy since the ankle fracture.  - Home health physical therapy order - Follow-up with orthopedic surgery

## 2020-08-22 NOTE — Assessment & Plan Note (Signed)
Lab Results  Component Value Date   HGBA1C 11.6 (H) 06/16/2020   HGBA1C 10.3 (A) 05/15/2020   HGBA1C >14.0 (A) 02/08/2020  Jennifer [name] has a history of poorly controlled diabetes in setting of medication non-adherence. This may be in part due to lack of understanding on behalf of the patient with checking her blood sugars routinely and administering her insulin.  Her glucometer shows very labile blood sugars ranging from a low of 64 to a high of 479.  She endorses daily compliance with her insulin, but has been noted to have issues with compliance in the past.  Also noted to be using metformin once daily due to side effects, discussed increasing to twice daily she will try this.  CGM was placed at last visit but unfortunately data was not being transferred.  Our diabetes educators been helping with this today, will place new CGM tomorrow for better monitoring.  - Metformin 500 mg extended release twice daily, try to increase in future but she is having GI upset so does not like taking it - Continue insulin 70/30, 15 units in the morning and 30 units in the evening - Return for CGM placement tomorrow

## 2020-08-22 NOTE — Progress Notes (Signed)
   CC: Diabetes, hypertension  HPI:  [name] is a 50 y.o. female with history as below presenting for diabetes, hypertension, prior ankle fracture with deconditioning, depression. Please refer to problem based charting for further details of assessment and plan of current problem and chronic medical conditions.  Patient request manual wheelchair for use outside the home Patient's current mobility limits activities outside the home including grocery shopping, shopping, going to medical appointments.  Current mobility limitations cannot be resolved by the use of a cane or walker.  She is able to safely use a manual wheelchair, and her daughter Jennifer [name], members can help propel her.  Functional mobility deficit can be resolved by use of manual wheelchair.  Past Medical History:  Diagnosis Date  . Anemia, iron deficiency   . Atrial fibrillation ([place])   . Blindness of left eye   . COVID-19 virus infection 06/15/2020  . Decreased visual acuity    Left eye  . Depression   . 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 Hair loss   . History of fracture of clavicle 05/18/2015  . Hyperlipidemia   . Hypertension   . Iron deficiency anemia 05/13/2013  . Pain, dental 08/19/2018   Tooth pain/facial swelling: has poor dentition at baseline, history of dental abscess.  She has not seen a dentist in about one year.  Dentist is on [address].  No fevers chills or systemic symptoms.  Diabetes has been poorly controlled for some time.  She said it has improved recently averaging around 140.  Called the dentist said they would not see patients until May 14th but the urgency o  . Pap smear abnormality of cervix with LGSIL   . Routine/ritual circumcision   . Type II diabetes mellitus ([place])   . Uveitis    Review of Systems:   Review of Systems  Constitutional: Positive for  weight loss. Negative for chills and fever.  Gastrointestinal: Positive for diarrhea.  Neurological: Positive for dizziness. Negative for loss of consciousness.     Physical Exam: Vitals:   08/22/20 1514 08/22/20 1657 08/22/20 1658 08/22/20 1659  BP: 92/61 133/72 111/62 106/73  Pulse: 80 83 80 85  Temp: 98.2 F (36.8 C)     TempSrc: Oral     SpO2: 100%     Weight: 159 lb (72.1 kg)     Height: 5\' 6"  (1.676 m)      Constitutional: no acute distress Head: atraumatic ENT: external ears normal Cardiovascular: regular rate and rhythm, normal heart sounds Pulmonary: effort normal, normal breath sounds bilaterally Abdominal: flat Musculoskeletal: Scar over right medial malleolus from prior surgery, the right ankle appears larger compared to the left but there is no sign of erythema, edema, purulence.  She has a poor gait with significant pain evident when putting weight on the right ankle, leans heavily on furniture when standing up Skin: warm and dry Neurological: alert, no focal deficit Psychiatric: Flat affect  Assessment & Plan:   See Encounters Tab for problem based charting.  Patient seen with Dr. Jimmye Norman

## 2020-08-22 NOTE — Patient Instructions (Signed)
Thank you for allowing [place] to be a part of your care today, it was a pleasure seeing you. We discussed your diabetes, blood pressure  I am checking these labs: CMP, CBC, urinalysis  Your blood pressure was very low here initially.  This improved significantly with some water.  I suspect that you are very dehydrated, I think your idea of a water bottle with marked times is fantastic.  Try to drink any more at home, especially meats and vegetables.  Continue the insulin at the current dose, 15 units in the morning and 30 units at night.  I have made these changes to your medications: Increase metformin to twice daily I would like you to start the sertraline back, you can discuss this further with your family  I will see you tomorrow for a CGM change.   Thank you, and please call the [facility] at [phone] if you have any questions.  Best, Dr. Bridgett Larsson

## 2020-08-22 NOTE — Assessment & Plan Note (Signed)
Patient hypotensive to 92/61 on presentation.  She states she was dizzy earlier today, but states she feels normal at this time.  No recent GI or other illness, has felt in her normal state of health past few days.  She is not taking any blood pressure medications at this time.  Noted to have 60 pound weight loss in the past 4 months.  Daughter states that she has not been getting up and eating since her ankle fracture in January.  Also notes that her grandson has been in the ICU after a near drowning incident, has been there for 1 month and lives in [place].  Noted to have a flat affect.  Suspect there is a component of autonomic dysfunction in the setting of longstanding poorly controlled diabetes.  After hydrating with p.o. fluids, blood pressure improved to 122/65.  She is orthostatic, see below.  Daughter does states she has not had much p.o. intake recently.  Lying: 133/72, pulse 83 Sitting: 111/62, pulse 80 Standing: 106/73, pulse 85  - Encourage p.o. intake.  Daughter is going to purchase a water bottle with marked times for the patient to drink from - Patient is returning tomorrow, recheck blood pressure at that time - No current blood pressure medications - Suspect at least part of this is due to depression.  Patient and daughter are resistant to starting medication for this as they felt it was not helpful before.  They will discuss this further with her family.  Encouraged patient to do more activities outside the home.

## 2020-08-22 NOTE — Assessment & Plan Note (Signed)
Patient noted to have flat affect.  Daughter says she has not been eating much and has been less active since her ankle fracture in January.  Daughter agrees that she is probably depressed.  She was started on sertraline in the past, but stopped taking it because she did not feel was helpful.  In general, she does not like taking medications and has had great difficulty with compliance.  Also is interested in therapy at this time.  - Encouraged patient to do more activities outside the home - [place] at future visit - Like to restart sertraline if they would agree to - Family will continue discussing this with daughter

## 2020-08-22 NOTE — Progress Notes (Signed)
Diabetes Self Management Education and Support Jennifer [name] presents with hr daughter Jennifer [name] today. Her daughter translates and a waiver was signed or this. She states she has been taking 30 units of insulin in the am and 15 units in the Pm but not always before eating. Her meter was downloaded and th report was giving to Dr. Bridgett Larsson. I was unable to upload her Dexcom CGM. Called Dexcom tech support who said there was a problem with her App. She did not pick up more sensor at the pharmacy. We removed the old sensor. A follow up visit was scheduled for tomorrow.  Debera Lat, RD 08/22/2020 5:02 PM.

## 2020-08-22 NOTE — Patient Instructions (Addendum)
Please pick up Dexcom G 6 sensors at your pharmacy.   Pleas take insulin about 15 minutes before eating breakfast and dinner  I will see you tomorrow at [place] [phone]

## 2020-08-23 ENCOUNTER — Ambulatory Visit: Payer: Medicaid Other | Admitting: Dietician

## 2020-08-23 ENCOUNTER — Telehealth: Payer: Self-pay | Admitting: *Deleted

## 2020-08-23 NOTE — Telephone Encounter (Signed)
-----   Message from [name], MD sent at 08/22/2020  5:12 PM EDT ----- Regarding: follow up items Hi Lauren,  Thanks for your help with this patient.  I placed the order for the wheelchair and clarified with her that they would like to use this mainly for mobility outside the home.  I encouraged him to not use it at home if at all possible.  Also order physical therapy for this patient, let me know if you need any thing different on that order.

## 2020-08-23 NOTE — Telephone Encounter (Deleted)
-----   Message from Joshua Y Chen, MD sent at 08/22/2020  5:12 PM EDT ----- Regarding: follow up items Hi Lauren,  Thanks for your help with this patient.  I placed the order for the wheelchair and clarified with her that they would like to use this mainly for mobility outside the home.  I encouraged him to not use it at home if at all possible.  Also order physical therapy for this patient, let me know if you need any thing different on that order.  

## 2020-08-23 NOTE — Telephone Encounter (Signed)
CM sent to Jennifer [name] at [facility] for manual w/c. F2F was 08/22/20.

## 2020-08-23 NOTE — Telephone Encounter (Signed)
[place] calls and pharmacy told her the Dexcom needed a PA. Call to pharmacy- all went through.  Called [place]- they moved their appointment to tomorrow morning at 915 am

## 2020-08-23 NOTE — Telephone Encounter (Signed)
Lewis, Leah  Kindal Ponti L, RN; Lewis, Leah; Stenson, Melissa; Samples, Beverly; New, Bradley; 2 others   received, thanks   

## 2020-08-24 ENCOUNTER — Telehealth: Payer: Self-pay | Admitting: Internal Medicine

## 2020-08-24 ENCOUNTER — Telehealth: Payer: Self-pay | Admitting: Dietician

## 2020-08-24 ENCOUNTER — Ambulatory Visit: Payer: Medicaid Other | Admitting: Dietician

## 2020-08-24 NOTE — Telephone Encounter (Signed)
  Medicaid Managed Care   Unsuccessful Outreach Note  08/24/2020 Name: Jakeisha Stricker MRN: [id number] DOB: [birth year]  Referred by: Jose Persia, MD Reason for referral : No chief complaint on file.   An unsuccessful telephone outreach was attempted today. The patient was referred to the case management team for assistance with care management and care coordination.   Follow Up Plan: The care management team will reach out to the patient again over the next 7 days.   [place] [phone]

## 2020-08-24 NOTE — Telephone Encounter (Signed)
Patient's daughter asked to reschedule from 915 am to 315 this afternoon.

## 2020-08-29 ENCOUNTER — Other Ambulatory Visit: Payer: Self-pay | Admitting: Obstetrics and Gynecology

## 2020-08-29 ENCOUNTER — Other Ambulatory Visit: Payer: Self-pay

## 2020-08-29 NOTE — Patient Instructions (Signed)
Hi-thank you for speaking with me today.  Ms. Vetter daughter was given information about Medicaid Managed Care team care coordination services as a part of their [place] Medicaid benefit. Anabelen Gragert/patient's daughter verbally consented to engagement with the [facility] Managed Care team.   For questions related to your [facility], please call: [phone] or visit the homepage here: [email or web address]  If you would like to schedule transportation through your [facility], please call the following number at least 2 days in advance of your appointment: [phone].   Call the [place] at [phone], at any time, 24 hours a day, 7 days a week. If you are in danger or need immediate medical attention call 911.  Ms. Higdon - following are the goals we discussed in your visit today:  Goals Addressed            This Visit's Progress   . Protect My Health       Timeframe:  Long-Range Goal Priority:  High Start Date:       08/29/20                      Expected End Date:       11/29/20                Follow Up Date 09/29/20   - schedule appointment for flu shot - schedule appointment for vaccines needed due to my age or health - schedule recommended health tests (blood work, mammogram, colonoscopy, pap test) - schedule and keep appointment for annual check-up    Why is this important?    Screening tests can find diseases early when they are easier to treat.   Your doctor or nurse will talk with you about which tests are important for you.   Getting shots for common diseases like the flu and shingles will help prevent them.       Patient/Patient's daughter verbalizes understanding of instructions provided today.   The Managed Medicaid care management team will reach out to the patient/patient's Mother  again over the  next 30 days.  The patient/patient's Mother  has been provided with contact information for the Managed Medicaid care management team and has been advised to call with any health related questions or concerns.   [name] RN, BSN [place]  [facility] Curator - Managed Medicaid High Risk [phone].  Following is a copy of your plan of care:    Patient Care Plan: General Plan of Care (Adult)    Problem Identified: Health Promotion or Disease Self-Management (General Plan of Care)   Priority: High  Onset Date: 08/29/2020    Long-Range Goal: Self-Management Plan Developed   Start Date: 08/29/2020  Expected End Date: 11/29/2020  This Visit's Progress: Not on track  Priority: High  Note:   Current Barriers:   Ineffective Self Health Maintenance  Patient with diabetes, hypertension, prior ankle fracture with deconditioning, depression and ? 60 pound weight loss in 4 months.  Currently UNABLE TO independently self manage needs related to chronic health conditions.   Knowledge Deficits related to short term plan for care coordination needs and long term plans for chronic disease management needs Nurse Case Manager Clinical Goal(s):   patient will work with care management team to address care coordination and chronic disease management needs related to Disease Management  Educational Needs  Care Coordination  Medication Management and Education  Medication Reconciliation  Medication Assistance   Psychosocial Support  Mental Health Counseling   Interventions:   Evaluation of current treatment plan and patient's adherence to plan as established by provider.  Reviewed medications with patient/patient's daughter.  Collaborated with Social Work regarding depression.  Discussed plans with patient for ongoing care management follow up and provided patient with direct contact information for care management team  Reviewed  scheduled/upcoming provider appointments.  PCP has ordered PT for patient.  Patient has an appointment 09/01/20 for Dexcom.  Social Work referral for depression. Self Care Activities:  . Patient will self administer medications as prescribed . Patient will attend all scheduled provider appointments . Patient will call pharmacy for medication refills . Patient will attend church or other social activities . Patient will continue to perform ADL's independently . Patient will call provider office for new concerns or questions . Patient will work Social Work to address care coordination needs and will continue to work with the clinical team to address health care and disease management related needs.   Patient Goals: In the next 30 days, patient will attend all provider appointments. In the next 30 days, patient will take medications as directed. In the next 30 days, patient will speak with Social Work.  Follow Up Plan: [name] has been provided with contact information for the care management team and has been advised to call with any health related questions or concerns.  The care management team will reach out to the patient/patient's daughter.again over the next 30 days.    Evidence-based guidance:    Review biopsychosocial determinants of health screens.   Review need for preventive screening based on age, sex, family history and health history.   Determine level of modifiable health risk.   Discuss identified risks.   Identify areas where behavior change may lead to improved health.   Promote healthy lifestyle.   Evoke change talk using open-ended questions, pros and cons, as well as looking forward.   Identify and manage conditions or preconditions to reduce health risk.   Implement additional goals and interventions based on identified risk factors.

## 2020-08-29 NOTE — Patient Outreach (Signed)
Medicaid Managed Care   Nurse Care Manager Note  08/29/2020 Name:  Jennifer [name] MRN:  [id number] DOB:  1970/05/17  Jennifer [name] is an 50 y.o. year old female who is a primary patient of Jennifer Persia, MD.  The [facility] Managed Care Coordination team was consulted for assistance with:    chronic healthcare management needs.  Jennifer [name] daughter  was given information about Medicaid Managed Care Coordination team services today. Jennifer [name]/patient's daughter agreed to services and verbal consent obtained.  Engaged with patient/patient's daughter  by telephone for initial visit in response to provider referral for case management and/or care coordination services.   Assessments/Interventions:  Review of past medical history, allergies, medications, health status, including review of consultants reports, laboratory and other test data, was performed as part of comprehensive evaluation and provision of chronic care management services.  SDOH (Social Determinants of Health) assessments and interventions performed:   Care Plan  No Known Allergies  Medications Reviewed Today    Reviewed by Jennifer Medicus, RN (Registered Nurse) on 08/29/20 at 1432  Med List Status: <None>  Medication Order Taking? Sig Documenting Provider Last Dose Status Informant  Accu-Chek FastClix Lancets MISC [id number] Yes Check blood sugar 4 times a day Jennifer Persia, MD Taking Active Child  ACCU-CHEK GUIDE test strip [id number] Yes CHECK BLOOD SUGAR 4 TIMES PER DAY Jennifer Anis, MD Taking Active   acetaminophen (TYLENOL) 500 MG tablet [id number] Yes Take 500 mg by mouth every 6 (six) hours as needed for mild pain. [provider] Taking Active Child  apixaban (ELIQUIS) 5 MG TABS tablet [id number] No Take 1 tablet (5 mg total) by mouth 2 (two) times daily.  Patient not taking: Reported on 08/29/2020   Jennifer Anis, MD Not Taking Active   atorvastatin (LIPITOR) 40 MG tablet [id number] No Take 1  tablet (40 mg total) by mouth daily.  Patient not taking: Reported on 08/29/2020   Jennifer Anis, MD Not Taking Active   Blood Glucose Monitoring Suppl (ACCU-CHEK GUIDE) w/Device KIT [id number] Yes 1 each by Does not apply route 4 (four) times daily. Jennifer Persia, MD Taking Active Child  citalopram (CELEXA) 20 MG tablet [id number] No Take 1 tablet (20 mg total) by mouth daily. Take 81m (1 pill) for 7 days, then take 238m(2 pills) thereafter.  Patient not taking: Reported on 08/29/2020   Jennifer [name] Not Taking Active   Continuous Blood Gluc Receiver (DEXCOM G6 RECEIVER) DEVI [id number] Use to check blood sugar at least 6 times a day  Patient not taking: Reported on 08/29/2020   Jennifer [name] Not Taking Active   Continuous Blood Gluc Sensor (DEXCOM G6 SENSOR) MISC [id number] Use to check blood sugar at least 6 times a day  Patient not taking: Reported on 08/29/2020   Jennifer [name] Not Taking Active   Continuous Blood Gluc Transmit (DEXCOM G6 TRANSMITTER) MISC [id number] Use to check blood sugar at least 6 times a day  Patient not taking: Reported on 08/29/2020   Jennifer [name] Not Taking Active   diclofenac Sodium (VOLTAREN) 1 % GEL [id number] APPLY 2 GRAMS TO AFFECTED AREA 4 TIMES A DAY  Patient not taking: Reported on 08/29/2020   Jennifer DameMD Not Taking Active   ferrous sulfate 325 (65 FE) MG tablet [id number] 1 tablet (325 mg total) by mouth every Monday, Wednesday, and Friday for 14 days. LeMosetta AnisMD  Expired 07/07/20 2359   gabapentin (  NEURONTIN) 300 MG capsule [id number] No Take 1 capsule (300 mg total) by mouth at bedtime.  Patient not taking: Reported on 08/29/2020   Jennifer Heck, MD Not Taking Active   insulin aspart protamine - aspart (NOVOLOG MIX 70/30 FLEXPEN) (70-30) 100 UNIT/ML FlexPen [id number] Yes Inject 0.15-0.3 mLs (15-30 Units total) into the skin 2 (two) times daily. 30 unit morning, 15 unit supper Jennifer Anis, MD Taking Active    Insulin Pen Needle (B-D UF III MINI PEN NEEDLES) 31G X 5 MM MISC [id number] Yes Use pen needle daily for injections Jennifer Anis, MD Taking Active   metFORMIN (GLUCOPHAGE-XR) 500 MG 24 hr tablet [id number] Yes Take 1 tablet (500 mg total) by mouth 2 (two) times daily. Jennifer Au, MD Taking Active           Patient Active Problem List   Diagnosis Date Noted  . Depression 08/22/2020  . Weight loss, non-intentional 07/04/2020  . Adult failure to thrive 07/04/2020  . Nausea & vomiting 06/28/2020  . Early satiety 05/31/2020  . UTI (urinary tract infection) 05/16/2020  . Elevated liver enzymes 05/15/2020  . Closed trimalleolar fracture of right ankle 05/02/2020  . Cellulitis (Left 2nd toe) 03/27/2020  . Cellulitis of left foot 03/17/2020  . AKI (acute kidney injury) ([place]) 03/14/2020  . Hypotension 03/10/2020  . Right ankle injury 09/28/2019  . Bilateral lower extremity edema 06/07/2019  . Atrial fibrillation ([place]) 09/20/2018  . Migraine   . Hematuria 08/24/2018  . Vulvovaginitis 05/21/2018  . (HFpEF) heart failure with preserved ejection fraction ([place]) 01/01/2018  . 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  . High risk medication use 09/16/2017  . Pseudophakia of right eye 09/16/2017  . Left anterior shoulder pain 07/06/2017  . Hypertension associated with diabetes ([place]) 07/23/2016  . 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 Fatigue 04/02/2016  . 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 Vitamin D deficiency 10/30/2014  . Diabetic neuropathy ([place]) 05/13/2013  . Iron deficiency anemia 05/13/2013  . Hair loss 03/24/2012  . Healthcare maintenance 02/26/2011  . Hyperlipidemia 05/05/2006  . Type II diabetes mellitus ([place]) 04/22/1998    Conditions to be addressed/monitored per PCP order:  chronic healthcare management needs, DM, HTN, depression    Care Plan : General Plan of Care (Adult)  Updates made by Jennifer Medicus, RN since 08/29/2020 12:00 AM    Problem:  Health Promotion or Disease Self-Management (General Plan of Care)   Priority: High  Onset Date: 08/29/2020    Long-Range Goal: Self-Management Plan Developed   Start Date: 08/29/2020  Expected End Date: 11/29/2020  This Visit's Progress: Not on track  Priority: High  Note:   Current Barriers:   Ineffective Self Health Maintenance  Patient with diabetes, hypertension, prior ankle fracture with deconditioning, depression and ? 60 pound weight loss in 4 months.  Currently UNABLE TO independently self manage needs related to chronic health conditions.   Knowledge Deficits related to short term plan for care coordination needs and long term plans for chronic disease management needs Nurse Case Manager Clinical Goal(s):   patient will work with care management team to address care coordination and chronic disease management needs related to Disease Management  Educational Needs  Care Coordination  Medication Management and Education  Medication Reconciliation  Medication Assistance   Psychosocial Support  Mental Health Counseling   Interventions:   Evaluation of current treatment plan and patient's adherence to plan as established by provider.  Reviewed medications  with patient/patient's daughter.  Collaborated with Social Work regarding depression.  Discussed plans with patient for ongoing care management follow up and provided patient with direct contact information for care management team  Reviewed scheduled/upcoming provider appointments.  PCP has ordered PT for patient.  Patient has an appointment 09/01/20 for Dexcom.  Social Work referral for depression. Self Care Activities:  . Patient will self administer medications as prescribed . Patient will attend all scheduled provider appointments . Patient will call pharmacy for medication refills . Patient will attend church or other social activities . Patient will continue to perform ADL's independently . Patient will  call provider office for new concerns or questions . Patient will work Social Work to address care coordination needs and will continue to work with the clinical team to address health care and disease management related needs.   Patient Goals: In the next 30 days, patient will attend all provider appointments. In the next 30 days, patient will take medications as directed. In the next 30 days, patient will speak with Social Work.  Follow Up Plan: [name] has been provided with contact information for the care management team and has been advised to call with any health related questions or concerns.  The care management team will reach out to the patient/patient's daughter.again over the next 30 days.    Evidence-based guidance:    Review biopsychosocial determinants of health screens.   Review need for preventive screening based on age, sex, family history and health history.   Determine level of modifiable health risk.   Discuss identified risks.   Identify areas where behavior change may lead to improved health.   Promote healthy lifestyle.   Evoke change talk using open-ended questions, pros and cons, as well as looking forward.   Identify and manage conditions or preconditions to reduce health risk.   Implement additional goals and interventions based on identified risk factors.            Follow Up:  Patient/Patient's daughter agrees to Care Plan and Follow-up.  Plan: The Managed Medicaid care management team will reach out to the patient/patient's Mother  again over the next 30 days. and The patient/patient's Mother  has been provided with contact information for the Managed Medicaid care management team and has been advised to call with any health related questions or concerns.  Date/time of next scheduled RN care management/care coordination outreach:  09/29/20 at 0900.

## 2020-08-29 NOTE — Progress Notes (Signed)
Internal Medicine Clinic Attending  Case discussed with Dr. Bridgett Larsson  At the time of the visit.  We reviewed the resident's history and exam and pertinent patient test results.  I agree with the assessment, diagnosis, and plan of care documented in the resident's note. Worrisome health and functional decline is multifactorial.  CCM referral and HH for mobility assessment /DME determination will be important to better support her and to identify reversible barriers to improvement.  Cultural and language differences are significant and are associated with limited adherence to treatment regimen.  Depressed mood also playing a role.  At this rate, her trajectory will lead to prematurely limited life expectancy.

## 2020-08-30 ENCOUNTER — Ambulatory Visit: Payer: Medicaid Other

## 2020-08-31 ENCOUNTER — Ambulatory Visit: Payer: Self-pay

## 2020-09-01 ENCOUNTER — Ambulatory Visit (INDEPENDENT_AMBULATORY_CARE_PROVIDER_SITE_OTHER): Payer: Medicaid Other | Admitting: Internal Medicine

## 2020-09-01 ENCOUNTER — Observation Stay: Admission: AD | Admit: 2020-09-01 | Payer: Medicaid Other | Source: Ambulatory Visit | Admitting: Internal Medicine

## 2020-09-01 ENCOUNTER — Other Ambulatory Visit: Payer: Self-pay | Admitting: Licensed Clinical Social Worker

## 2020-09-01 ENCOUNTER — Encounter: Payer: Self-pay | Admitting: Internal Medicine

## 2020-09-01 ENCOUNTER — Other Ambulatory Visit: Payer: Self-pay | Admitting: Internal Medicine

## 2020-09-01 VITALS — BP 126/77 | HR 84 | Temp 97.6°F | Ht 67.0 in | Wt 156.5 lb

## 2020-09-01 DIAGNOSIS — I959 Hypotension, unspecified: Secondary | ICD-10-CM | POA: Diagnosis not present

## 2020-09-01 DIAGNOSIS — E11319 Type 2 diabetes mellitus with unspecified diabetic retinopathy without macular edema: Secondary | ICD-10-CM

## 2020-09-01 DIAGNOSIS — Z794 Long term (current) use of insulin: Secondary | ICD-10-CM

## 2020-09-01 DIAGNOSIS — F331 Major depressive disorder, recurrent, moderate: Secondary | ICD-10-CM

## 2020-09-01 DIAGNOSIS — R634 Abnormal weight loss: Secondary | ICD-10-CM

## 2020-09-01 LAB — POCT GLYCOSYLATED HEMOGLOBIN (HGB A1C): Hemoglobin A1C: 10.4 % — AB (ref 4.0–5.6)

## 2020-09-01 LAB — CBC
HCT: 38.8 % (ref 36.0–46.0)
Hemoglobin: 12 g/dL (ref 12.0–15.0)
MCH: 27.2 pg (ref 26.0–34.0)
MCHC: 30.9 g/dL (ref 30.0–36.0)
MCV: 88 fL (ref 80.0–100.0)
Platelets: 196 10*3/uL (ref 150–400)
RBC: 4.41 MIL/uL (ref 3.87–5.11)
RDW: 13.5 % (ref 11.5–15.5)
WBC: 4.5 10*3/uL (ref 4.0–10.5)
nRBC: 0 % (ref 0.0–0.2)

## 2020-09-01 LAB — BASIC METABOLIC PANEL
Anion gap: 9 (ref 5–15)
BUN: 25 mg/dL — ABNORMAL HIGH (ref 6–20)
CO2: 27 mmol/L (ref 22–32)
Calcium: 10.1 mg/dL (ref 8.9–10.3)
Chloride: 99 mmol/L (ref 98–111)
Creatinine, Ser: 1.47 mg/dL — ABNORMAL HIGH (ref 0.44–1.00)
GFR, Estimated: 43 mL/min — ABNORMAL LOW (ref >60)
Glucose, Bld: 238 mg/dL — ABNORMAL HIGH (ref 70–99)
Potassium: 4.3 mmol/L (ref 3.5–5.1)
Sodium: 135 mmol/L (ref 135–145)

## 2020-09-01 LAB — LACTIC ACID, PLASMA: Lactic Acid, Venous: 1.2 mmol/L (ref 0.5–1.9)

## 2020-09-01 LAB — GLUCOSE, CAPILLARY: Glucose-Capillary: 226 mg/dL — ABNORMAL HIGH (ref 70–99)

## 2020-09-01 MED ORDER — SODIUM CHLORIDE 0.9 % IV BOLUS: 1000.0000 mL | Freq: Once | INTRAVENOUS | Status: DC

## 2020-09-01 MED ORDER — METOCLOPRAMIDE HCL 5 MG PO TABS: 5.0000 mg | ORAL_TABLET | Freq: Three times a day (TID) | ORAL | 0 refills | Status: DC

## 2020-09-01 NOTE — Patient Instructions (Signed)
Visit Information  Jennifer [name] was given information about Medicaid Managed Care team care coordination services as a part of their [place] Medicaid benefit. Jennifer [name] verbally consented to engagement with the [facility] Managed Care team.   For questions related to your [facility], please call: [phone] or visit the homepage here: [email or web address]  If you would like to schedule transportation through your [facility], please call the following number at least 2 days in advance of your appointment: [phone].   Call the [facility] at [phone], at any time, 24 hours a day, 7 days a week. If you are in danger or need immediate medical attention call 911.  Jennifer [name] - following are the goals we discussed in your visit today:  Goals Addressed            This Visit's Progress   . Manage My Emotions       Timeframe:  Long-Range Goal Priority:  High Start Date:    09/01/20                         Expected End Date:  11/01/20                     Follow Up Date- 09/08/20   - begin personal counseling - call and visit an old friend - check out volunteer opportunities - join a support group - laugh; watch a funny movie or comedian - learn and use visualization or guided imagery - perform a random act of kindness - practice relaxation or meditation daily - start or continue a personal journal - talk about feelings with a friend, family or spiritual advisor - practice positive thinking and self-talk    Why is this important?    When you are stressed, down or upset, your body reacts too.   For example, your blood pressure may get higher; you may have a headache or stomachache.   When your emotions get the best of you, your body's ability to fight off cold and flu gets weak.   These steps will help you manage your emotions.      Notes:        Jennifer [name], BSW, MSW, [employer] Managed Medicaid LCSW [email or web address] Phone: [phone]     Following is a copy of your plan of care:  Patient Care Plan: General Plan of Care (Adult)    Problem Identified: Health Promotion or Disease Self-Management (General Plan of Care)   Priority: High  Onset Date: 08/29/2020    Patient Care Plan: General Plan of Care (Adult)    Problem Identified: Health Promotion or Disease Self-Management (General Plan of Care)   Priority: High  Onset Date: 08/29/2020    Long-Range Goal: Self-Management Plan Developed   Start Date: 08/29/2020  Expected End Date: 11/29/2020  This Visit's Progress: Not on track  Priority: High  Note:   Current Barriers:   Ineffective Self Health Maintenance  Patient with diabetes, hypertension, prior ankle fracture with deconditioning, depression and ? 60 pound weight loss in 4 months.  Currently UNABLE TO independently self manage needs related to chronic health conditions.   Knowledge Deficits related to short term plan for care coordination needs and long term plans for chronic disease management needs Nurse Case Manager Clinical Goal(s):   patient will work with care management team to address care coordination and chronic  disease management needs related to Disease Management  Educational Needs  Care Coordination  Medication Management and Education  Medication Reconciliation  Medication Assistance   Psychosocial Support  Mental Health Counseling   Interventions:   Evaluation of current treatment plan and patient's adherence to plan as established by provider.  Reviewed medications with patient/patient's daughter.  Collaborated with Social Work regarding depression.  Discussed plans with patient for ongoing care management follow up and provided patient with direct contact information for care management team  Reviewed  scheduled/upcoming provider appointments.  PCP has ordered PT for patient.  Patient has an appointment 09/01/20 for Dexcom.  Social Work referral for depression. Self Care Activities:  . Patient will self administer medications as prescribed . Patient will attend all scheduled provider appointments . Patient will call pharmacy for medication refills . Patient will attend church or other social activities . Patient will continue to perform ADL's independently . Patient will call provider office for new concerns or questions . Patient will work Social Work to address care coordination needs and will continue to work with the clinical team to address health care and disease management related needs.   Patient Goals: In the next 30 days, patient will attend all provider appointments. In the next 30 days, patient will take medications as directed. In the next 30 days, patient will speak with Social Work.  Follow Up Plan: [name] has been provided with contact information for the care management team and has been advised to call with any health related questions or concerns.  The care management team will reach out to the patient/patient's daughter.again over the next 30 days.    Evidence-based guidance:    Review biopsychosocial determinants of health screens.   Review need for preventive screening based on age, sex, family history and health history.   Determine level of modifiable health risk.   Discuss identified risks.   Identify areas where behavior change may lead to improved health.   Promote healthy lifestyle.   Evoke change talk using open-ended questions, pros and cons, as well as looking forward.   Identify and manage conditions or preconditions to reduce health risk.   Implement additional goals and interventions based on identified risk factors.    Task: Mutually Develop and Royce Macadamia Achievement of Patient Goals

## 2020-09-01 NOTE — Progress Notes (Signed)
VAST consult to obtain IV access for fluid bolus. Upon arrival in internal med clinic, physician stated to hold off on IV access at this time as pt's BP has increased.

## 2020-09-01 NOTE — Progress Notes (Signed)
CC: Diabetes follow up  HPI:  Jennifer [name] is a 50 y.o. with a PMHx as listed below who presents to the clinic for diabetes.   Please see the Encounters tab for problem-based Assessment & Plan regarding status of patient's acute and chronic conditions.  Past Medical History:  Diagnosis Date  . Anemia, iron deficiency   . Atrial fibrillation ([place])   . Blindness of left eye   . COVID-19 virus infection 06/15/2020  . Decreased visual acuity    Left eye  . Depression   . 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 Hair loss   . History of fracture of clavicle 05/18/2015  . Hyperlipidemia   . Hypertension   . Iron deficiency anemia 05/13/2013  . Pain, dental 08/19/2018   Tooth pain/facial swelling: has poor dentition at baseline, history of dental abscess.  She has not seen a dentist in about one year.  Dentist is on [address].  No fevers chills or systemic symptoms.  Diabetes has been poorly controlled for some time.  She said it has improved recently averaging around 140.  Called the dentist said they would not see patients until May 14th but the urgency o  . Pap smear abnormality of cervix with LGSIL   . Routine/ritual circumcision   . Type II diabetes mellitus ([place])   . Uveitis    Review of Systems: Review of Systems  Constitutional: Positive for weight loss. Negative for chills and fever.  Respiratory: Negative for shortness of breath and wheezing.   Cardiovascular: Negative for chest pain, palpitations and leg swelling.  Gastrointestinal: Positive for nausea. Negative for blood in stool, constipation, diarrhea and melena.       +early satiety  Neurological: Negative for dizziness, focal weakness, seizures, weakness and headaches.   Physical Exam:  Vitals:   09/01/20 0940 09/01/20 1027 09/01/20 1124  BP: (!) 75/44 (!) 83/45 126/77   Pulse: 81 75 84  Temp: 97.6 F (36.4 C)    TempSrc: Oral    SpO2: 100%    Weight: 156 lb 8 oz (71 kg)    Height: 5\' 7"  (1.702 m)     Physical Exam Vitals and nursing note reviewed.  Constitutional:      General: She is not in acute distress.    Appearance: She is normal weight.  HENT:     Head: Normocephalic.  Cardiovascular:     Rate and Rhythm: Normal rate and regular rhythm.     Pulses:          Radial pulses are 1+ on the right side and 1+ on the left side.     Heart sounds: No murmur heard. No gallop.   Pulmonary:     Effort: Pulmonary effort is normal. No respiratory distress.     Breath sounds: No wheezing, rhonchi or rales.  Abdominal:     General: Bowel sounds are normal. There is no distension.     Palpations: Abdomen is soft.     Tenderness: There is no abdominal tenderness. There is no guarding.  Musculoskeletal:     Right lower leg: No edema.     Left lower leg: No edema.  Neurological:     General: No focal deficit present.     Mental Status: She is alert and oriented to person, place, and time. Mental status is at baseline.  Psychiatric:  Mood and Affect: Mood normal.        Behavior: Behavior normal.     Assessment & Plan:   See Encounters Tab for problem based charting.  Patient discussed with Dr. Philipp Ovens

## 2020-09-01 NOTE — Patient Instructions (Addendum)
It was nice seeing you today! Thank you for choosing [facility] for your Primary Care.    Today we talked about:   1. Low blood pressure: Please make sure you are drinking at least 5 to 6 bottles of water per day!!   2. To increase your appetite and water intake, start a medication called Reglan. Take 1 tablet 30 minutes before every meal (three meals per day).   3. I will ask our pharmacist to help with getting the pill packets

## 2020-09-01 NOTE — Patient Outreach (Signed)
Medicaid Managed Care Social Work Note  09/01/2020 Name:  Jennifer [name] MRN:  [id number] DOB:  26-Oct-1970  Jennifer [name] is an 50 y.o. year old female who is a primary patient of Jose Persia, MD.  The Medicaid Managed Care Coordination team was consulted for assistance with:  [place] and Resources  Ms. Egnew was given information about Medicaid Managed CareCoordination services today. [name] agreed to services and verbal consent obtained.  Engaged with patient  for by telephone forinitial visit in response to referral for case management and/or care coordination services.   Assessments/Interventions:  Review of past medical history, allergies, medications, health status, including review of consultants reports, laboratory and other test data, was performed as part of comprehensive evaluation and provision of chronic care management services.  SDOH: (Social Determinant of Health) assessments and interventions performed: SDOH Interventions   Flowsheet Row Most Recent Value  SDOH Interventions   SDOH Interventions for the Following Domains Depression  Depression Interventions/Treatment  Referral to Psychiatry      Advanced Directives Status:  See Care Plan for related entries.  Care Plan                 No Known Allergies  Medications Reviewed Today    Reviewed by Jose Persia, MD (Resident) on 09/01/20 at [place] List Status: <None>  Medication Order Taking? Sig Documenting Provider Last Dose Status Informant  Accu-Chek FastClix Lancets MISC [id number] No Check blood sugar 4 times a day Jose Persia, MD Taking Active Child  ACCU-CHEK GUIDE test strip [id number] No CHECK BLOOD SUGAR 4 TIMES PER DAY Mosetta Anis, MD Taking Active   acetaminophen (TYLENOL) 500 MG tablet [id number] No Take 500 mg by mouth every 6 (six) hours as needed for mild pain. [provider] Taking Active Child  apixaban (ELIQUIS) 5 MG TABS tablet [id number] No Take 1  tablet (5 mg total) by mouth 2 (two) times daily.  Patient not taking: Reported on 08/29/2020   Mosetta Anis, MD Not Taking Active   atorvastatin (LIPITOR) 40 MG tablet [id number] No Take 1 tablet (40 mg total) by mouth daily.  Patient not taking: Reported on 08/29/2020   Mosetta Anis, MD Not Taking Active   Blood Glucose Monitoring Suppl (ACCU-CHEK GUIDE) w/Device KIT [id number] No 1 each by Does not apply route 4 (four) times daily. Jose Persia, MD Taking Active Child  citalopram (CELEXA) 20 MG tablet [id number] No Take 1 tablet (20 mg total) by mouth daily. Take 10m (1 pill) for 7 days, then take 25m(2 pills) thereafter.  Patient not taking: Reported on 08/29/2020   AsHarvie HeckMD Not Taking Active   Continuous Blood Gluc Receiver (DEXCOM G6 RECEIVER) DEVI [id number] Use to check blood sugar at least 6 times a day  Patient not taking: Reported on 08/29/2020   AsHarvie HeckMD Not Taking Active   Continuous Blood Gluc Sensor (DEXCOM G6 SENSOR) MISC [id number] Use to check blood sugar at least 6 times a day  Patient not taking: Reported on 08/29/2020   AsHarvie HeckMD Not Taking Active   Continuous Blood Gluc Transmit (DEXCOM G6 TRANSMITTER) MISC [id number] Use to check blood sugar at least 6 times a day  Patient not taking: Reported on 08/29/2020   AsHarvie HeckMD Not Taking Active   diclofenac Sodium (VOLTAREN) 1 % GEL [id number] APPLY 2 GRAMS TO AFFECTED AREA 4 TIMES A DAY  Patient not taking: Reported on 08/29/2020  Sanjuan Dame, MD Not Taking Active   ferrous sulfate 325 (65 FE) MG tablet [id number]  Take 1 tablet (325 mg total) by mouth every Monday, Wednesday, and Friday for 14 days. Mosetta Anis, MD  Expired 07/07/20 2359   gabapentin (NEURONTIN) 300 MG capsule [id number] No Take 1 capsule (300 mg total) by mouth at bedtime.  Patient not taking: Reported on 08/29/2020   Harvie Heck, MD Not Taking Active   insulin aspart protamine - aspart (NOVOLOG MIX 70/30 FLEXPEN)  (70-30) 100 UNIT/ML FlexPen [id number] No Inject 0.15-0.3 mLs (15-30 Units total) into the skin 2 (two) times daily. 30 unit morning, 15 unit supper Mosetta Anis, MD Taking Active   Insulin Pen Needle (B-D UF III MINI PEN NEEDLES) 31G X 5 MM MISC [id number] No Use pen needle daily for injections Mosetta Anis, MD Taking Active   metFORMIN (GLUCOPHAGE-XR) 500 MG 24 hr tablet [id number] No Take 1 tablet (500 mg total) by mouth 2 (two) times daily. [name], MD Taking Active           Patient Active Problem List   Diagnosis Date Noted  . Depression 08/22/2020  . Weight loss, non-intentional 07/04/2020  . Adult failure to thrive 07/04/2020  . Nausea & vomiting 06/28/2020  . Early satiety 05/31/2020  . UTI (urinary tract infection) 05/16/2020  . Elevated liver enzymes 05/15/2020  . Closed trimalleolar fracture of right ankle 05/02/2020  . Cellulitis (Left 2nd toe) 03/27/2020  . Cellulitis of left foot 03/17/2020  . AKI (acute kidney injury) ([place]) 03/14/2020  . Hypotension 03/10/2020  . Right ankle injury 09/28/2019  . Bilateral lower extremity edema 06/07/2019  . Atrial fibrillation ([place]) 09/20/2018  . Migraine   . Hematuria 08/24/2018  . Vulvovaginitis 05/21/2018  . (HFpEF) heart failure with preserved ejection fraction ([place]) 01/01/2018  . 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  . High risk medication use 09/16/2017  . Pseudophakia of right eye 09/16/2017  . Left anterior shoulder pain 07/06/2017  . Hypertension associated with diabetes ([place]) 07/23/2016  . 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 Fatigue 04/02/2016  . 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 Vitamin D deficiency 10/30/2014  . Diabetic neuropathy ([place]) 05/13/2013  . Iron deficiency anemia 05/13/2013  . Hair loss 03/24/2012  . Healthcare maintenance 02/26/2011  . Hyperlipidemia 05/05/2006  . Type II diabetes mellitus ([place]) 04/22/1998    Conditions to be addressed/monitored per PCP order:   Depression  Care Plan : LCSW Plan of Care  Updates made by Greg Cutter, LCSW since 09/01/2020 12:00 AM    Problem: Depression Identification (Depression)     Long-Range Goal: Depressive Symptoms Identified   Start Date: 09/01/2020  Expected End Date: 11/01/2020  Priority: High  Note:   Timeframe:  Long-Range Goal Priority:  High Start Date:    09/01/20                         Expected End Date:  11/01/20                     Follow Up Date- 09/08/20  Current barriers:   . Chronic Mental Health needs related to depression . Limited social support, Mental Health Concerns , and language barrier as patient speaks Arabic  Needs Support, Education, and Care Coordination in order to meet unmet mental health needs.  Clinical Goal(s): Over the next 120 days, patient will work with SW  to reduce or manage symptoms of anxiety, depression, and stress and increase knowledge and/or ability of: coping skills, healthy habits, self-management skills, and stress reduction.until connected for ongoing counseling.  Clinical Interventions:  . Assessed patient's previous treatment, needs, coping skills, current treatment, support system and barriers to care  . [facility] LCSW contact patient's main number through an [place] interpreter but patient reports that she prefers that all communication and calls go through her daughter who is a great advocate for her and can speak [place].  . Patient reports recent stress because her grandson is in the ICU and she was just notified that a close friend of hers has cancer.  . Other interventions: Depression screen reviewed , Solution-Focused Strategies, Behavioral Activation, Motivational Interviewing, Brief CBT , Provided basic mental health support, education  , and Suicidal Ideation/Homicidal Ideation assessed: ; . Patient interviewed and appropriate assessments performed . Discussed plans with patient for ongoing care management follow up and provided patient with direct  contact information for care management team . Assisted patient/caregiver with obtaining information about health plan benefits . Referred patient to [facility] (mental health provider) for long term follow up and therapy/counseling . Discussed several options for long term counseling based on need and insurance.  . Assisted patient with narrowing the options down to [facility] ) Referral placed on on 09/01/20 for therapy and psychiatry  . Inter-disciplinary care team collaboration (see longitudinal plan of care)  Patient Goals/Self-Care Activities: Over the next 120 days . - avoid negative self-talk . - develop a personal safety plan . - develop a plan to deal with triggers like holidays, anniversaries . - exercise at least 2 to 3 times per week . - have a plan for how to handle bad days . - journal feelings and what helps to feel better or worse . - spend time or talk with others at least 2 to 3 times per week . - spend time or talk with others every day . - watch for early signs of feeling worse . - write in journal every day . - begin personal counseling . - call and visit an old friend . - check out volunteer opportunities . - join a support group . - laugh; watch a funny movie or comedian . - learn and use visualization or guided imagery . - perform a random act of kindness . - practice relaxation or meditation daily . - start or continue a personal journal . - talk about feelings with a friend, family or spiritual advisor . - practice positive thinking and self-talk . Call [facility] to schedule your counseling appointment if they have not called you in 7 days.         Follow up:  Patient agrees to Care Plan and Follow-up.  Plan: The Managed Medicaid care management team will reach out to the patient again over the next 14 days.  Date of next scheduled Social Work care  management/care coordination outreach: 09/08/20  Eula Fried, BSW, MSW, LCSW Managed Medicaid LCSW [email or web address] Phone: [phone]

## 2020-09-04 NOTE — Assessment & Plan Note (Signed)
Vitals:   09/01/20 0940 09/01/20 1027 09/01/20 1124  BP: (!) 75/44 (!) 83/45 126/77   Patient was noted to be hypotensive on arrival to appointment. She denies any current dizziness, headache, palpitations. She states she is drinking water daily, approximately 4 bottles per day. However, she does have early satiety that has limited her water intake.   Mrs. Fincher denies any fever, chills, recent illness.   Assessment/Plan: Hypotensive in presentation with MAP of 53. Physical examination reassuring. Stat labs obtained, including CBC, BMP and lactic acid. LA and CBC unremarkable. BMP shows stable renal function.   Patient drank approximately 24 ounces of water during appointment and blood pressure improved.  No IV fluids required. Hypotension likely due to dehydration.  - Encouraged patient to continue working on daily water intake, recommending at least 5-6 bottles per day - Will start Reglan for early satiety (see separate A&P)

## 2020-09-05 NOTE — Assessment & Plan Note (Signed)
Ms. Stegmaier endorses weight loss due to difficulty eating her meals due to early satiety.  She notes early satiety has been happening now for at least a few weeks.  Denies any difficulty swallowing, nausea, vomiting.  Assessment/plan: Given patient's long-term uncontrolled diabetes, have a high suspicion patient has developed gastroparesis.  Will trial Reglan and titrate as needed.  Last EKG was evaluated and QTc within normal limits.   -Reglan 5 mg 3 times daily with meals plan -10-day follow-up

## 2020-09-05 NOTE — Assessment & Plan Note (Signed)
Lab Results  Component Value Date   HGBA1C 10.4 (A) 09/01/2020   Ms. Brocious states she is taking her insulin 70/30 twice a day as instructed, 30 units in the morning and 15 units at nighttime.She is also taking metformin twice daily, stating this is the only oral medication she is taking daily. She has her Dexcom on, but is unsure how to download the data. She brought her glucometer as well that shows wide fluctuations in CBG.   Assessment/Plan:  Diabetes slowly improving with A1c today down to 10.4%. Per glucometer findings, patient's sugar varies from 70s to 400s. We did not discuss adjusting insulin regimen at this time given acute hypotension.  - Continue current regimen - Dexcom app instructions provided

## 2020-09-05 NOTE — Progress Notes (Signed)
Internal Medicine Clinic Attending  Case discussed with Dr. Basaraba  At the time of the visit.  We reviewed the resident's history and exam and pertinent patient test results.  I agree with the assessment, diagnosis, and plan of care documented in the resident's note.  

## 2020-09-07 NOTE — Addendum Note (Signed)
Addended by: Jose Persia on: 09/07/2020 12:47 AM   Modules accepted: Orders

## 2020-09-08 ENCOUNTER — Telehealth: Payer: Self-pay | Admitting: Licensed Clinical Social Worker

## 2020-09-08 ENCOUNTER — Ambulatory Visit: Payer: Self-pay

## 2020-09-08 NOTE — Patient Instructions (Signed)
Jennifer [name] ,   The Medicaid Managed Care Team is available to provide assistance to you with your healthcare needs at no cost and as a benefit of your Medicaid Health plan. Please reach out to me at the number below. I am available to be of assistance to you regarding your healthcare needs. .   Thank you,   Lashawna Poche, BSW, MSW, LCSW Managed Medicaid LCSW [place]  [facility] [email or web address] Phone: [phone]   

## 2020-09-08 NOTE — Patient Outreach (Signed)
[facility]) Care Management  [facility] Social Work  09/08/2020  Jennifer [name] Mar 16, 1971 [id number]  Encounter Medications:  Outpatient Encounter Medications as of 09/08/2020  Medication Sig  . Accu-Chek FastClix Lancets MISC Check blood sugar 4 times a day  . ACCU-CHEK GUIDE test strip CHECK BLOOD SUGAR 4 TIMES PER DAY  . acetaminophen (TYLENOL) 500 MG tablet Take 500 mg by mouth every 6 (six) hours as needed for mild pain.  Marland Kitchen apixaban (ELIQUIS) 5 MG TABS tablet Take 1 tablet (5 mg total) by mouth 2 (two) times daily. (Patient not taking: Reported on 08/29/2020)  . atorvastatin (LIPITOR) 40 MG tablet Take 1 tablet (40 mg total) by mouth daily. (Patient not taking: Reported on 08/29/2020)  . Blood Glucose Monitoring Suppl (ACCU-CHEK GUIDE) w/Device KIT 1 each by Does not apply route 4 (four) times daily.  . citalopram (CELEXA) 20 MG tablet TAKE 10MG (1 PILL) FOR 7 DAYS, THEN TAKE 2 TABS THEREAFTER.  . Continuous Blood Gluc Receiver (DEXCOM G6 RECEIVER) DEVI Use to check blood sugar at least 6 times a day (Patient not taking: Reported on 08/29/2020)  . Continuous Blood Gluc Sensor (DEXCOM G6 SENSOR) MISC Use to check blood sugar at least 6 times a day (Patient not taking: Reported on 08/29/2020)  . Continuous Blood Gluc Transmit (DEXCOM G6 TRANSMITTER) MISC Use to check blood sugar at least 6 times a day (Patient not taking: Reported on 08/29/2020)  . diclofenac Sodium (VOLTAREN) 1 % GEL APPLY 2 GRAMS TO AFFECTED AREA 4 TIMES A DAY (Patient not taking: Reported on 08/29/2020)  . ferrous sulfate 325 (65 FE) MG tablet Take 1 tablet (325 mg total) by mouth every Monday, Wednesday, and Friday for 14 days.  Marland Kitchen gabapentin (NEURONTIN) 300 MG capsule Take 1 capsule (300 mg total) by mouth at bedtime. (Patient not taking: Reported on 08/29/2020)  . insulin aspart protamine - aspart (NOVOLOG MIX 70/30 FLEXPEN) (70-30) 100 UNIT/ML FlexPen Inject 0.15-0.3 mLs (15-30 Units total) into the skin 2 (two)  times daily. 30 unit morning, 15 unit supper  . Insulin Pen Needle (B-D UF III MINI PEN NEEDLES) 31G X 5 MM MISC Use pen needle daily for injections  . metFORMIN (GLUCOPHAGE-XR) 500 MG 24 hr tablet Take 1 tablet (500 mg total) by mouth 2 (two) times daily.  . metoCLOPramide (REGLAN) 5 MG tablet Take 1 tablet (5 mg total) by mouth 3 (three) times daily before meals.   No facility-administered encounter medications on file as of 09/08/2020.    Functional Status:  In your present state of health, do you have any difficulty performing the following activities: 09/01/2020 08/22/2020  Hearing? N N  Vision? Y Y  Difficulty concentrating or making decisions? Y N  Comment AT TIMES -  Walking or climbing stairs? Y Y  Comment - blind  Dressing or bathing? Y N  Comment - -  Doing errands, shopping? N Y  Comment - blind  Some recent data might be hidden    Fall/Depression Screening:  PHQ 2/9 Scores 09/01/2020 08/09/2020 07/03/2020 04/27/2020 03/27/2020 03/10/2020 02/08/2020  PHQ - 2 Score 2 0 0 0 - 6 2  PHQ- 9 Score 13 0 0 - - 25 3  Exception Documentation - - - - Other- indicate reason in comment box - -   LCSW completed [facility] outreach attempt today but was unable to reach patient successfully. A HIPPA compliant voice message was left encouraging patient to return call once available. LCSW will reschedule patient's [facility] Social Work  appointment if no return call has been made. [facility] LCSW also completed call to [facility] inquiring about referral that was placed on 09/01/20 for medication management and therapy. [facility] LCSW unable to reach staff but left a detailed message requesting a return back.   Eula Fried, BSW, MSW, [employer] Managed Medicaid LCSW [email or web address] Phone: [phone]

## 2020-09-22 ENCOUNTER — Other Ambulatory Visit: Payer: Self-pay | Admitting: Licensed Clinical Social Worker

## 2020-09-22 NOTE — Patient Instructions (Signed)
Visit Information  Ms. Fagerstrom was given information about Medicaid Managed Care team care coordination services as a part of their [place] Medicaid benefit. Dezaria Methot verbally consented to engagement with the [facility] Managed Care team.   For questions related to your [facility], please call: [phone] or visit the homepage here: [email or web address]  If you would like to schedule transportation through your [facility], please call the following number at least 2 days in advance of your appointment: [phone].   Call the [place] at [phone], at any time, 24 hours a day, 7 days a week. If you are in danger or need immediate medical attention call 911.  Ms. Gallion - following are the goals we discussed in your visit today:  Goals Addressed            This Visit's Progress   . Manage My Emotions       Timeframe:  Long-Range Goal Priority:  High Start Date:    09/01/20                         Expected End Date:  11/01/20                     Follow Up Date- 10/05/20   - begin personal counseling - call and visit an old friend - check out volunteer opportunities - join a support group - laugh; watch a funny movie or comedian - learn and use visualization or guided imagery - perform a random act of kindness - practice relaxation or meditation daily - start or continue a personal journal - talk about feelings with a friend, family or spiritual advisor - practice positive thinking and self-talk    Why is this important?    When you are stressed, down or upset, your body reacts too.   For example, your blood pressure may get higher; you may have a headache or stomachache.   When your emotions get the best of you, your body's ability to fight off cold and flu gets weak.   These steps will help you manage your emotions.      Notes:       Eula Fried, BSW, MSW, [employer] Managed Medicaid LCSW [email or web address] Phone: [phone]

## 2020-09-22 NOTE — Patient Outreach (Signed)
Medicaid Managed Care Social Work Note  09/22/2020 Name:  Jennifer [name] MRN:  [id number] DOB:  [birth year]  Jennifer [name] is an 50 y.o. year old female who is a primary patient of Jose Persia, MD.  The Medicaid Managed Care Coordination team was consulted for assistance with:  [place] and Resources  [name] was given information about Medicaid Managed CareCoordination services today. [name] agreed to services and verbal consent obtained.  Engaged with patient  for by telephone forfollow up visit in response to referral for case management and/or care coordination services.   Assessments/Interventions:  Review of past medical history, allergies, medications, health status, including review of consultants reports, laboratory and other test data, was performed as part of comprehensive evaluation and provision of chronic care management services.  SDOH: (Social Determinant of Health) assessments and interventions performed: SDOH Interventions   Flowsheet Row Most Recent Value  SDOH Interventions   Depression Interventions/Treatment  Referral to Psychiatry, Counseling      Advanced Directives Status:  See Care Plan for related entries.  Care Plan                 No Known Allergies  Medications Reviewed Today    Reviewed by Jose Persia, MD (Resident) on 09/05/20 at [phone]  Med List Status: <None>  Medication Order Taking? Sig Documenting Provider Last Dose Status Informant  Accu-Chek FastClix Lancets MISC [id number] No Check blood sugar 4 times a day Jose Persia, MD Taking Active Child  ACCU-CHEK GUIDE test strip [id number] No CHECK BLOOD SUGAR 4 TIMES PER DAY Mosetta Anis, MD Taking Active   acetaminophen (TYLENOL) 500 MG tablet [id number] No Take 500 mg by mouth every 6 (six) hours as needed for mild pain. [provider] Taking Active Child  apixaban (ELIQUIS) 5 MG TABS tablet [id number] No Take 1 tablet (5 mg total) by mouth 2 (two) times  daily.  Patient not taking: Reported on 08/29/2020   Mosetta Anis, MD Not Taking Active   atorvastatin (LIPITOR) 40 MG tablet [id number] No Take 1 tablet (40 mg total) by mouth daily.  Patient not taking: Reported on 08/29/2020   Mosetta Anis, MD Not Taking Active   Blood Glucose Monitoring Suppl (ACCU-CHEK GUIDE) w/Device KIT [id number] No 1 each by Does not apply route 4 (four) times daily. Jose Persia, MD Taking Active Child  citalopram (CELEXA) 20 MG tablet [id number]  TAKE 10MG (1 PILL) FOR 7 DAYS, THEN TAKE 2 TABS THEREAFTER. [name], MD  Active   Continuous Blood Gluc Receiver (DEXCOM G6 RECEIVER) DEVI [id number] No Use to check blood sugar at least 6 times a day  Patient not taking: Reported on 08/29/2020   Harvie Heck, MD Not Taking Active   Continuous Blood Gluc Sensor (DEXCOM G6 SENSOR) MISC [id number] No Use to check blood sugar at least 6 times a day  Patient not taking: Reported on 08/29/2020   Harvie Heck, MD Not Taking Active   Continuous Blood Gluc Transmit (DEXCOM G6 TRANSMITTER) MISC [id number] No Use to check blood sugar at least 6 times a day  Patient not taking: Reported on 08/29/2020   Harvie Heck, MD Not Taking Active   diclofenac Sodium (VOLTAREN) 1 % GEL [id number] No APPLY 2 GRAMS TO AFFECTED AREA 4 TIMES A DAY  Patient not taking: Reported on 08/29/2020   Sanjuan Dame, MD Not Taking Active   ferrous sulfate 325 (65 FE) MG tablet [id number]  Take 1 tablet (325 mg total) by mouth every  Monday, Wednesday, and Friday for 14 days. Mosetta Anis, MD  Expired 07/07/20 2359   gabapentin (NEURONTIN) 300 MG capsule [id number] No Take 1 capsule (300 mg total) by mouth at bedtime.  Patient not taking: Reported on 08/29/2020   Harvie Heck, MD Not Taking Active   insulin aspart protamine - aspart (NOVOLOG MIX 70/30 FLEXPEN) (70-30) 100 UNIT/ML FlexPen [id number] No Inject 0.15-0.3 mLs (15-30 Units total) into the skin 2 (two) times daily. 30 unit morning, 15 unit  supper Mosetta Anis, MD Taking Active   Insulin Pen Needle (B-D UF III MINI PEN NEEDLES) 31G X 5 MM MISC [id number] No Use pen needle daily for injections Mosetta Anis, MD Taking Active   metFORMIN (GLUCOPHAGE-XR) 500 MG 24 hr tablet [id number] No Take 1 tablet (500 mg total) by mouth 2 (two) times daily. [name], MD Taking Active   metoCLOPramide (REGLAN) 5 MG tablet [id number] Yes Take 1 tablet (5 mg total) by mouth 3 (three) times daily before meals. Jose Persia, MD  Active           Patient Active Problem List   Diagnosis Date Noted  . Depression 08/22/2020  . Weight loss, non-intentional 07/04/2020  . Adult failure to thrive 07/04/2020  . Nausea & vomiting 06/28/2020  . Early satiety 05/31/2020  . UTI (urinary tract infection) 05/16/2020  . Elevated liver enzymes 05/15/2020  . Closed trimalleolar fracture of right ankle 05/02/2020  . Cellulitis (Left 2nd toe) 03/27/2020  . Cellulitis of left foot 03/17/2020  . AKI (acute kidney injury) ([place]) 03/14/2020  . Hypotension 03/10/2020  . Right ankle injury 09/28/2019  . Bilateral lower extremity edema 06/07/2019  . Atrial fibrillation ([place]) 09/20/2018  . Migraine   . Hematuria 08/24/2018  . Vulvovaginitis 05/21/2018  . (HFpEF) heart failure with preserved ejection fraction ([place]) 01/01/2018  . 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  . High risk medication use 09/16/2017  . Pseudophakia of right eye 09/16/2017  . Left anterior shoulder pain 07/06/2017  . Hypertension associated with diabetes ([place]) 07/23/2016  . 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 Fatigue 04/02/2016  . 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 Vitamin D deficiency 10/30/2014  . Diabetic neuropathy ([place]) 05/13/2013  . Iron deficiency anemia 05/13/2013  . Hair loss 03/24/2012  . Healthcare maintenance 02/26/2011  . Hyperlipidemia 05/05/2006  . Type II diabetes mellitus ([place]) 04/22/1998    Conditions to be addressed/monitored per PCP  order:  Depression  Care Plan : LCSW Plan of Care  Updates made by Greg Cutter, LCSW since 09/22/2020 12:00 AM    Problem: Depression Identification (Depression)     Long-Range Goal: Depressive Symptoms Identified   Start Date: 09/01/2020  Expected End Date: 11/01/2020  Priority: High  Note:   Timeframe:  Long-Range Goal Priority:  High Start Date:    09/01/20                         Expected End Date:  11/01/20                     Follow Up Date- 10/06/18  Current barriers:   . Chronic Mental Health needs related to depression . Limited social support, Mental Health Concerns , and language barrier as patient speaks Arabic  Needs Support, Education, and Care Coordination in order to meet unmet mental health needs.  Clinical Goal(s): Over the next 120 days, patient will work  with SW to reduce or manage symptoms of anxiety, depression, and stress and increase knowledge and/or ability of: coping skills, healthy habits, self-management skills, and stress reduction.until connected for ongoing counseling.  Clinical Interventions:  . Assessed patient's previous treatment, needs, coping skills, current treatment, support system and barriers to care  . [facility] LCSW contact patient's main number through an [place] interpreter but patient reports that she prefers that all communication and calls go through her daughter who is a great advocate for her and can speak [place].  . Patient reports recent stress because her grandson is in the ICU and she was just notified that a close friend of hers has cancer.  . Other interventions: Depression screen reviewed , Solution-Focused Strategies, Behavioral Activation, Motivational Interviewing, Brief CBT , Provided basic mental health support, education  , and Suicidal Ideation/Homicidal Ideation assessed: ; . Patient interviewed and appropriate assessments performed . Discussed plans with patient for ongoing care management follow up and provided patient with  direct contact information for care management team . Assisted patient/caregiver with obtaining information about health plan benefits . Provided education and assistance to client regarding Advanced Directives. . Referred patient to [facility] (mental health provider) for long term follow up and therapy/counseling . Discussed several options for long term counseling based on need and insurance.  . Assisted patient with narrowing the options down to [facility] ) Referral placed on on 09/01/20 for therapy and psychiatry. However, only counseling appointment was scheduled for 11/02/20. Daughter reports that patient wants to wait for psychiatry appointment once she completes her initial therapy appointment.  Bertram Savin care team collaboration (see longitudinal plan of care)  Patient Goals/Self-Care Activities: Over the next 120 days . - avoid negative self-talk . - develop a personal safety plan . - develop a plan to deal with triggers like holidays, anniversaries . - exercise at least 2 to 3 times per week . - have a plan for how to handle bad days . - journal feelings and what helps to feel better or worse . - spend time or talk with others at least 2 to 3 times per week . - spend time or talk with others every day . - watch for early signs of feeling worse . - write in journal every day . - begin personal counseling . - call and visit an old friend . - check out volunteer opportunities . - join a support group . - laugh; watch a funny movie or comedian . - learn and use visualization or guided imagery . - perform a random act of kindness . - practice relaxation or meditation daily . - start or continue a personal journal . - talk about feelings with a friend, family or spiritual advisor . - practice positive thinking and self-talk . Call [facility] if you want to schedule a psychiatry  appointment          Follow up:  Patient agrees to Care Plan and Follow-up.  Plan: The Managed Medicaid care management team will reach out to the patient again over the next 30 days.  Date of next scheduled Social Work care management/care coordination outreach:  10/05/20  Eula Fried, BSW, MSW, LCSW Managed Medicaid LCSW [email or web address] Phone: [phone]

## 2020-09-24 NOTE — Progress Notes (Signed)
Hi Brook,  I believe we had a conversation about this patient previously. Her referral was received and I was able to contact the patient, she is scheduled for an intake assessment with one of our counselors and requested to hold off on scheduling any psychiatry appointments until her initial appointment is completed.  Thanks!  Lyric J. Dew, [place] [address] [place] [address] Outpatient Services, 2nd Floor Contact: [phone]  Fax: [phone]

## 2020-09-25 ENCOUNTER — Telehealth: Payer: Self-pay

## 2020-09-25 NOTE — Telephone Encounter (Signed)
Received TC from patient's daughter, patient is overheard in background.  She c/o feeling fatigued and week X 2 weeks, worse over last 2 days.  They are requesting an appt to be seen and states she will sometimes need IV fluids.  RN asked if patient had been drinking water, daughter states yes, encouraged to push fluids. Appt given for tomorrow, 0915, Dr. Shon Baton yellow team full. Daughter was instructed if patient becomes disoriented, SOB, dizzy or feels like fainting, or has chest pain to take her to the ED and she verbalized understanding.  They deny these symptoms now. SChaplin, RN,BSN

## 2020-09-26 ENCOUNTER — Other Ambulatory Visit: Payer: Self-pay

## 2020-09-26 ENCOUNTER — Emergency Department (HOSPITAL_COMMUNITY): Payer: Medicaid Other

## 2020-09-26 ENCOUNTER — Encounter (HOSPITAL_COMMUNITY): Payer: Self-pay

## 2020-09-26 ENCOUNTER — Encounter: Payer: Medicaid Other | Admitting: Pharmacist

## 2020-09-26 ENCOUNTER — Encounter: Payer: Medicaid Other | Admitting: Student

## 2020-09-26 ENCOUNTER — Telehealth: Payer: Self-pay | Admitting: *Deleted

## 2020-09-26 ENCOUNTER — Emergency Department (HOSPITAL_COMMUNITY)
Admission: EM | Admit: 2020-09-26 | Discharge: 2020-09-27 | Disposition: A | Discharge status: Home / Self Care | Payer: Medicaid Other | Attending: Emergency Medicine | Admitting: Emergency Medicine

## 2020-09-26 DIAGNOSIS — Z8616 Personal history of COVID-19: Secondary | ICD-10-CM | POA: Diagnosis not present

## 2020-09-26 DIAGNOSIS — E1140 Type 2 diabetes mellitus with diabetic neuropathy, unspecified: Secondary | ICD-10-CM | POA: Insufficient documentation

## 2020-09-26 DIAGNOSIS — E1165 Type 2 diabetes mellitus with hyperglycemia: Secondary | ICD-10-CM | POA: Diagnosis not present

## 2020-09-26 DIAGNOSIS — Z794 Long term (current) use of insulin: Secondary | ICD-10-CM | POA: Diagnosis not present

## 2020-09-26 DIAGNOSIS — Z7984 Long term (current) use of oral hypoglycemic drugs: Secondary | ICD-10-CM | POA: Diagnosis not present

## 2020-09-26 DIAGNOSIS — I509 Heart failure, unspecified: Secondary | ICD-10-CM | POA: Diagnosis not present

## 2020-09-26 DIAGNOSIS — I110 Hypertensive heart disease with heart failure: Secondary | ICD-10-CM | POA: Insufficient documentation

## 2020-09-26 DIAGNOSIS — E860 Dehydration: Secondary | ICD-10-CM | POA: Diagnosis not present

## 2020-09-26 DIAGNOSIS — R531 Weakness: Secondary | ICD-10-CM | POA: Diagnosis present

## 2020-09-26 LAB — HEPATIC FUNCTION PANEL
ALT: 13 U/L (ref 0–44)
AST: 17 U/L (ref 15–41)
Albumin: 3.7 g/dL (ref 3.5–5.0)
Alkaline Phosphatase: 78 U/L (ref 38–126)
Bilirubin, Direct: <0.1 mg/dL (ref 0.0–0.2)
Total Bilirubin: 0.3 mg/dL (ref 0.3–1.2)
Total Protein: 7.7 g/dL (ref 6.5–8.1)

## 2020-09-26 LAB — CBC
HCT: 37.3 % (ref 36.0–46.0)
Hemoglobin: 12.5 g/dL (ref 12.0–15.0)
MCH: 28.5 pg (ref 26.0–34.0)
MCHC: 33.5 g/dL (ref 30.0–36.0)
MCV: 85 fL (ref 80.0–100.0)
Platelets: 184 10*3/uL (ref 150–400)
RBC: 4.39 MIL/uL (ref 3.87–5.11)
RDW: 12.5 % (ref 11.5–15.5)
WBC: 5.1 10*3/uL (ref 4.0–10.5)
nRBC: 0 % (ref 0.0–0.2)

## 2020-09-26 LAB — BASIC METABOLIC PANEL
Anion gap: 8 (ref 5–15)
BUN: 34 mg/dL — ABNORMAL HIGH (ref 6–20)
CO2: 28 mmol/L (ref 22–32)
Calcium: 10 mg/dL (ref 8.9–10.3)
Chloride: 98 mmol/L (ref 98–111)
Creatinine, Ser: 1.29 mg/dL — ABNORMAL HIGH (ref 0.44–1.00)
GFR, Estimated: 51 mL/min — ABNORMAL LOW (ref >60)
Glucose, Bld: 253 mg/dL — ABNORMAL HIGH (ref 70–99)
Potassium: 4.1 mmol/L (ref 3.5–5.1)
Sodium: 134 mmol/L — ABNORMAL LOW (ref 135–145)

## 2020-09-26 LAB — CBG MONITORING, ED
Glucose-Capillary: 283 mg/dL — ABNORMAL HIGH (ref 70–99)
Glucose-Capillary: 169 mg/dL — ABNORMAL HIGH (ref 70–99)

## 2020-09-26 MED ORDER — SODIUM CHLORIDE 0.9 % IV BOLUS
2000.0000 mL | Freq: Once | INTRAVENOUS | Status: AC
  Administered 2020-09-26: 2000 mL via INTRAVENOUS

## 2020-09-26 NOTE — ED Notes (Addendum)
Pt in restroom attempting to give urine sample

## 2020-09-26 NOTE — Discharge Instructions (Signed)
Drink plenty of fluids.  Follow-up with your family doctor this week for recheck 

## 2020-09-26 NOTE — ED Provider Notes (Signed)
Jennifer [name] DEPT Provider Note   CSN: [id number] Arrival date & time: 09/26/20  1508     History Chief Complaint  Patient presents with   Hyperglycemia    Jennifer [name] is a 49 y.o. female.  Patient complains of general weakness and elevated glucose.  Patient has history of diabetes.  No fevers or chills alcohol  The history is provided by the patient and medical records. No language interpreter was used.  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  She has not seen a dentist in about one year.  Dentist is on [address].  No fevers chills or systemic symptoms.  Diabetes has been poorly controlled for some time.  She said it has improved recently averaging around 140.  Called the dentist said they would not see patients until May 14th but the urgency o   Pap smear abnormality of cervix with LGSIL    Routine/ritual circumcision    Type II diabetes mellitus ([place])    Uveitis     Patient Active Problem  List   Diagnosis Date Noted   Depression 08/22/2020   Weight loss, non-intentional 07/04/2020   Adult failure to thrive 07/04/2020   Nausea & vomiting 06/28/2020   Early satiety 05/31/2020   UTI (urinary tract infection) 05/16/2020   Elevated liver enzymes 05/15/2020   Closed trimalleolar fracture of right ankle 05/02/2020   Cellulitis (Left 2nd toe) 03/27/2020   Cellulitis of left foot 03/17/2020   AKI (acute kidney injury) ([place]) 03/14/2020   Hypotension 03/10/2020   Right ankle injury 09/28/2019   Bilateral lower extremity edema 06/07/2019   Atrial fibrillation ([place]) 09/20/2018   Migraine    Hematuria 08/24/2018   Vulvovaginitis 05/21/2018   (HFpEF) heart failure with preserved ejection fraction ([place]) 01/01/2018   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   High risk medication use 09/16/2017   Pseudophakia of right eye 09/16/2017   Left anterior shoulder pain 07/06/2017   Hypertension associated with diabetes ([place]) 07/23/2016   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   Fatigue 04/02/2016   Uveitic glaucoma of left eye, 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   Vitamin D deficiency 10/30/2014   Diabetic neuropathy ([place]) 05/13/2013   Iron deficiency anemia 05/13/2013   Hair loss 03/24/2012   Healthcare maintenance 02/26/2011   Hyperlipidemia 05/05/2006   Type II diabetes mellitus ([place]) 04/22/1998    Past Surgical History:  Procedure Laterality Date   CESAREAN SECTION  x 1   EYE SURGERY Bilateral    Lasik   MULTIPLE TOOTH EXTRACTIONS     bottom denture   ORIF ANKLE FRACTURE Right 05/05/2020   Procedure: OPEN REDUCTION INTERNAL FIXATION (ORIF) ANKLE FRACTURE;  Surgeon: Shona Needles, MD;  Location: [facility];  Service: Orthopedics;  Laterality: Right;     OB History     Gravida  7   Para  4   Term  4   Preterm  0   AB  3   Living         SAB  3   IAB  0   Ectopic  0   Multiple      Live Births              Family History  Problem Relation Age of Onset    Diabetes Father    Hypertension Other     Social History   Tobacco Use   Smoking status: Never Smoker   Smokeless tobacco: Never Used  Vaping Use   Vaping Use: Never used  Substance Use Topics   Alcohol use: No   Drug use: No    Home Medications Prior to Admission medications   Medication Sig Start Date End Date Taking? Authorizing Provider  Accu-Chek FastClix Lancets MISC Check blood sugar 4 times a day 06/07/19   Jose Persia, MD  ACCU-CHEK GUIDE test strip CHECK BLOOD SUGAR 4 TIMES PER DAY 06/23/20   Mosetta Anis, MD  acetaminophen (TYLENOL) 500 MG tablet Take 500 mg by mouth every 6 (six) hours as needed for mild pain.    [provider]  apixaban (ELIQUIS) 5 MG TABS tablet Take 1 tablet (5 mg total) by mouth 2 (two) times daily. Patient not taking: Reported on 08/29/2020 06/23/20   Mosetta Anis, MD  atorvastatin (LIPITOR) 40 MG tablet Take 1 tablet (40 mg total) by mouth daily. Patient not taking: Reported on 08/29/2020 06/23/20   Mosetta Anis, MD  Blood Glucose Monitoring Suppl (ACCU-CHEK GUIDE) w/Device KIT 1 each by Does not apply route 4 (four) times daily. 11/19/18   Jose Persia, MD  citalopram (CELEXA) 20 MG tablet TAKE 10MG (1 PILL) FOR 7 DAYS, THEN TAKE 2 TABS THEREAFTER. 09/01/20   [name], MD  Continuous Blood Gluc Receiver (DEXCOM G6 RECEIVER) DEVI Use to check blood sugar at least 6 times a day Patient not taking: Reported on 08/29/2020 08/09/20   Harvie Heck, MD  Continuous Blood Gluc Sensor (DEXCOM G6 SENSOR) MISC Use to check blood sugar at least 6 times a day Patient not taking: Reported on 08/29/2020 08/09/20   Harvie Heck, MD  Continuous Blood Gluc Transmit (DEXCOM G6 TRANSMITTER) MISC Use to check blood sugar at least 6 times a day Patient not taking: Reported on 08/29/2020 08/09/20   Harvie Heck, MD  diclofenac Sodium (VOLTAREN) 1 % GEL APPLY 2 GRAMS TO AFFECTED AREA 4 TIMES A DAY Patient not taking: Reported on 08/29/2020 07/17/20    Sanjuan Dame, MD  ferrous sulfate 325 (65 FE) MG tablet Take 1 tablet (325 mg total) by mouth every Monday, Wednesday, and Friday for 14 days. 06/23/20 07/07/20  Mosetta Anis, MD  gabapentin (NEURONTIN) 300 MG capsule Take 1 capsule (300 mg total) by mouth at bedtime. Patient not taking: Reported on 08/29/2020 08/09/20 08/09/21  Harvie Heck, MD  insulin aspart protamine - aspart (NOVOLOG MIX 70/30 FLEXPEN) (70-30) 100 UNIT/ML FlexPen Inject 0.15-0.3 mLs (15-30 Units total) into the skin 2 (  two) times daily. 30 unit morning, 15 unit supper 06/23/20   Mosetta Anis, MD  Insulin Pen Needle (B-D UF III MINI PEN NEEDLES) 31G X 5 MM MISC Use pen needle daily for injections 06/23/20   Mosetta Anis, MD  metFORMIN (GLUCOPHAGE-XR) 500 MG 24 hr tablet Take 1 tablet (500 mg total) by mouth 2 (two) times daily. 08/22/20   [name], MD  metoCLOPramide (REGLAN) 5 MG tablet Take 1 tablet (5 mg total) by mouth 3 (three) times daily before meals. 09/01/20   Jose Persia, MD    Allergies    Patient has no known allergies.  Review of Systems   Review of Systems  Constitutional:  Negative for appetite change and fatigue.  HENT:  Negative for congestion, ear discharge and sinus pressure.   Eyes:  Negative for discharge.  Respiratory:  Negative for cough.   Cardiovascular:  Negative for chest pain.  Gastrointestinal:  Negative for abdominal pain and diarrhea.  Genitourinary:  Negative for frequency and hematuria.  Musculoskeletal:  Negative for back pain.  Skin:  Negative for rash.  Neurological:  Positive for weakness. Negative for seizures and headaches.  Psychiatric/Behavioral:  Negative for hallucinations.    Physical Exam Updated Vital Signs BP (!) 145/78   Pulse 78   Temp 97.9 F (36.6 C) (Oral)   Resp 12   LMP 08/21/2016 (Exact Date)   SpO2 100%   Physical Exam Vitals and nursing note reviewed.  Constitutional:      Appearance: She is well-developed.  HENT:     Head: Normocephalic.      Nose: Nose normal.  Eyes:     General: No scleral icterus.    Conjunctiva/sclera: Conjunctivae normal.  Neck:     Thyroid: No thyromegaly.  Cardiovascular:     Rate and Rhythm: Normal rate and regular rhythm.     Heart sounds: No murmur heard.   No friction rub. No gallop.  Pulmonary:     Breath sounds: No stridor. No wheezing or rales.  Chest:     Chest wall: No tenderness.  Abdominal:     General: There is no distension.     Tenderness: There is no abdominal tenderness. There is no rebound.  Musculoskeletal:        General: Normal range of motion.     Cervical back: Neck supple.  Lymphadenopathy:     Cervical: No cervical adenopathy.  Skin:    Findings: No erythema or rash.  Neurological:     Mental Status: She is alert and oriented to person, place, and time.     Motor: No abnormal muscle tone.     Coordination: Coordination normal.  Psychiatric:        Behavior: Behavior normal.    ED Results / Procedures / Treatments   Labs (all labs ordered are listed, but only abnormal results are displayed) Labs Reviewed  BASIC METABOLIC PANEL - Abnormal; Notable for the following components:      Result Value   Sodium 134 (*)    Glucose, Bld 253 (*)    BUN 34 (*)    Creatinine, Ser 1.29 (*)    GFR, Estimated 51 (*)    All other components within normal limits  CBG MONITORING, ED - Abnormal; Notable for the following components:   Glucose-Capillary 283 (*)    All other components within normal limits  CBG MONITORING, ED - Abnormal; Notable for the following components:   Glucose-Capillary 169 (*)    All  other components within normal limits  CBC  HEPATIC FUNCTION PANEL  URINALYSIS, ROUTINE W REFLEX MICROSCOPIC    EKG None  Radiology CT Head Wo Contrast  Result Date: 09/26/2020 CLINICAL DATA:  Evaluate for intracranial hemorrhage. Generalized weakness for the several days. EXAM: CT HEAD WITHOUT CONTRAST TECHNIQUE: Contiguous axial images were obtained from the base  of the skull through the vertex without intravenous contrast. COMPARISON:  03/08/2020 FINDINGS: Brain: No evidence of acute infarction, hemorrhage, hydrocephalus, extra-axial collection or mass lesion/mass effect. Vascular: No hyperdense vessel or unexpected calcification. Skull: Normal. Negative for fracture or focal lesion. Sinuses/Orbits: No acute finding. Other: None. IMPRESSION: No acute intracranial abnormalities. Normal brain. Electronically Signed   By: Kerby Moors M.D.   On: 09/26/2020 19:23    Procedures Procedures   Medications Ordered in ED Medications  sodium chloride 0.9 % bolus 2,000 mL (0 mLs Intravenous Stopped 09/26/20 2235)    ED Course  I have reviewed the triage vital signs and the nursing notes.  Pertinent labs & imaging results that were available during my care of the patient were reviewed by me and considered in my medical decision making (see chart for details).    MDM Rules/Calculators/A&P                        Patient with mild dehydration and mildly elevated glucose.  Urinalysis pending.  Weakness probably more related to diabetes and dehydration Final Clinical Impression(s) / ED Diagnoses Final diagnoses:  None    Rx / DC Orders ED Discharge Orders     None        Milton Ferguson, MD 09/28/20 1101

## 2020-09-26 NOTE — ED Notes (Addendum)
CBG resulted 283 mg/dL

## 2020-09-26 NOTE — ED Triage Notes (Signed)
EMS reports from home, Hyperglycemia (432 per EMS) Pt states has generalized weakness for the last few days.  BP 92/47 HR 79 RR 20 Sp02 98 RA CBG 432 Temp 97.5  Primary Language Arabic. Son at bedside can provide history

## 2020-09-26 NOTE — ED Notes (Signed)
Unsuccessful attempt for in & out. Provider notified. Pt placed on purewick and given oral fluids

## 2020-09-26 NOTE — Telephone Encounter (Signed)
PATIENT CONTACTED REGARDING HER MISSED APPOINTMENT / SPOKE WITH HER SON, HE IS TO CALL THE [phone] NUMBER TO THE CLINIC TO GET HIS MOTHER RESCHEDULED.

## 2020-09-27 LAB — URINALYSIS, ROUTINE W REFLEX MICROSCOPIC
Bacteria, UA: NONE SEEN
Bilirubin Urine: NEGATIVE
Glucose, UA: >=500 mg/dL — AB
Ketones, ur: NEGATIVE mg/dL
Nitrite: NEGATIVE
Protein, ur: NEGATIVE mg/dL
Specific Gravity, Urine: 1.005 (ref 1.005–1.030)
pH: 5 (ref 5.0–8.0)

## 2020-09-27 NOTE — ED Notes (Signed)
Pt and daughter verbalized understanding of d/c and follow up care.

## 2020-09-27 NOTE — ED Provider Notes (Signed)
Nursing notes and vitals signs, including pulse oximetry, reviewed.  Summary of this visit's results, reviewed by myself:  EKG:  EKG Interpretation  Date/Time:    Ventricular Rate:    PR Interval:    QRS Duration:   QT Interval:    QTC Calculation:   R Axis:     Text Interpretation:         Labs:  Results for orders placed or performed during the hospital encounter of 09/26/20 (from the past 24 hour(s))  CBG monitoring, ED     Status: Abnormal   Collection Time: 09/26/20  5:04 PM  Result Value Ref Range   Glucose-Capillary 283 (H) 70 - 99 mg/dL  Basic metabolic panel     Status: Abnormal   Collection Time: 09/26/20  5:08 PM  Result Value Ref Range   Sodium 134 (L) 135 - 145 mmol/L   Potassium 4.1 3.5 - 5.1 mmol/L   Chloride 98 98 - 111 mmol/L   CO2 28 22 - 32 mmol/L   Glucose, Bld 253 (H) 70 - 99 mg/dL   BUN 34 (H) 6 - 20 mg/dL   Creatinine, Ser 1.29 (H) 0.44 - 1.00 mg/dL   Calcium 10.0 8.9 - 10.3 mg/dL   GFR, Estimated 51 (L) >60 mL/min   Anion gap 8 5 - 15  CBC     Status: None   Collection Time: 09/26/20  5:08 PM  Result Value Ref Range   WBC 5.1 4.0 - 10.5 K/uL   RBC 4.39 3.87 - 5.11 MIL/uL   Hemoglobin 12.5 12.0 - 15.0 g/dL   HCT 37.3 36.0 - 46.0 %   MCV 85.0 80.0 - 100.0 fL   MCH 28.5 26.0 - 34.0 pg   MCHC 33.5 30.0 - 36.0 g/dL   RDW 12.5 11.5 - 15.5 %   Platelets 184 150 - 400 K/uL   nRBC 0.0 0.0 - 0.2 %  Hepatic function panel     Status: None   Collection Time: 09/26/20  5:08 PM  Result Value Ref Range   Total Protein 7.7 6.5 - 8.1 g/dL   Albumin 3.7 3.5 - 5.0 g/dL   AST 17 15 - 41 U/L   ALT 13 0 - 44 U/L   Alkaline Phosphatase 78 38 - 126 U/L   Total Bilirubin 0.3 0.3 - 1.2 mg/dL   Bilirubin, Direct <0.1 0.0 - 0.2 mg/dL   Indirect Bilirubin NOT CALCULATED 0.3 - 0.9 mg/dL  CBG monitoring, ED     Status: Abnormal   Collection Time: 09/26/20  9:29 PM  Result Value Ref Range   Glucose-Capillary 169 (H) 70 - 99 mg/dL  Urinalysis, Routine w reflex  microscopic Urine, Clean Catch     Status: Abnormal   Collection Time: 09/27/20 12:30 AM  Result Value Ref Range   Color, Urine STRAW (A) YELLOW   APPearance CLEAR CLEAR   Specific Gravity, Urine 1.005 1.005 - 1.030   pH 5.0 5.0 - 8.0   Glucose, UA >=500 (A) NEGATIVE mg/dL   Hgb urine dipstick SMALL (A) NEGATIVE   Bilirubin Urine NEGATIVE NEGATIVE   Ketones, ur NEGATIVE NEGATIVE mg/dL   Protein, ur NEGATIVE NEGATIVE mg/dL   Nitrite NEGATIVE NEGATIVE   Leukocytes,Ua LARGE (A) NEGATIVE   RBC / HPF 0-5 0 - 5 RBC/hpf   WBC, UA 21-50 0 - 5 WBC/hpf   Bacteria, UA NONE SEEN NONE SEEN   Squamous Epithelial / LPF 0-5 0 - 5    Imaging Studies: CT Head  Wo Contrast  Result Date: 09/26/2020 CLINICAL DATA:  Evaluate for intracranial hemorrhage. Generalized weakness for the several days. EXAM: CT HEAD WITHOUT CONTRAST TECHNIQUE: Contiguous axial images were obtained from the base of the skull through the vertex without intravenous contrast. COMPARISON:  03/08/2020 FINDINGS: Brain: No evidence of acute infarction, hemorrhage, hydrocephalus, extra-axial collection or mass lesion/mass effect. Vascular: No hyperdense vessel or unexpected calcification. Skull: Normal. Negative for fracture or focal lesion. Sinuses/Orbits: No acute finding. Other: None. IMPRESSION: No acute intracranial abnormalities. Normal brain. Electronically Signed   By: Kerby Moors M.D.   On: 09/26/2020 19:23   Urinalysis not diagnostic of UTI but patient does have a history of frequent UTIs.  Urine sent for culture.     Swayzie Choate, Jenny Reichmann, MD 09/27/20 [phone]

## 2020-09-29 ENCOUNTER — Other Ambulatory Visit: Payer: Self-pay

## 2020-09-29 ENCOUNTER — Other Ambulatory Visit: Payer: Self-pay | Admitting: Obstetrics and Gynecology

## 2020-09-29 LAB — URINE CULTURE: Culture: NO GROWTH

## 2020-09-29 NOTE — Patient Instructions (Signed)
Hi Jennifer [name], thank you for speaking with me today.  Jennifer [name] /patient's daughter was given information about Medicaid Managed Care team care coordination services as a part of their [place] Medicaid benefit. Jennifer [name] /patient's daughter verbally consented to engagement with the [facility] Managed Care team.   For questions related to your [facility], please call: [phone] or visit the homepage here: [email or web address]  If you would like to schedule transportation through your [facility], please call the following number at least 2 days in advance of your appointment: [phone].   Call the [place] at [phone], at any time, 24 hours a day, 7 days a week. If you are in danger or need immediate medical attention call 911.  Jennifer [name] - following are the goals we discussed in your visit today:    Goals Addressed             This Visit's Progress    Protect My Health       Timeframe:  Long-Range Goal Priority:  High Start Date:       08/29/20                      Expected End Date:       11/29/20                Follow Up Date 10/29/20.   - schedule appointment for flu shot - schedule appointment for vaccines needed due to my age or health - schedule recommended health tests (blood work, mammogram, colonoscopy, pap test) - schedule and keep appointment for annual check-up  Update 09/29/20:  Patient needs follow up appt with PCP following ED visit-? Nutritionist and PT referral as well as Dexcom.   Why is this important?   Screening tests can find diseases early when they are easier to treat.  Your doctor or nurse will talk with you about which tests are important for you.  Getting shots for common diseases like the flu and shingles will help prevent them.           Patient verbalizes understanding of  instructions provided today.   The Managed Medicaid care management team will reach out to the patient again over the next 30 days.  The patient has been provided with contact information for the Managed Medicaid care management team and has been advised to call with any health related questions or concerns.   Jennifer Raider RN, BSN [place]  [facility] - Managed Medicaid High Risk [phone].    Following is a copy of your plan of care:   Patient Care Plan: General Plan of Care (Adult)     Problem Identified: Health Promotion or Disease Self-Management (General Plan of Care)   Priority: High  Onset Date: 08/29/2020     Long-Range Goal: Self-Management Plan Developed   Start Date: 08/29/2020  Expected End Date: 11/29/2020  Recent Progress: Not on track  Priority: High  Note:   Current Barriers:  Ineffective Self Health Maintenance Patient with diabetes, hypertension, prior ankle fracture with deconditioning, depression and ? 60 pound weight loss in 4 months. Currently UNABLE TO independently self manage needs related to chronic health conditions.  Knowledge Deficits related to short term plan for care coordination needs and long term plans for chronic disease management needs Nurse Case Manager Clinical Goal(s):  patient will work with care management team to address care  coordination and chronic disease management needs related to Disease Management Educational Needs Care Coordination Medication Management and Education Medication Reconciliation Medication Assistance  Psychosocial Support Mental Health Counseling   Interventions:  Evaluation of current treatment plan and patient's adherence to plan as established by provider. Reviewed medications with patient/patient's daughter. Collaborated with Social Work regarding depression. Discussed plans with patient for ongoing care management follow up and provided patient with direct  contact information for care management team Reviewed scheduled/upcoming provider appointments.  PCP has ordered PT for patient.  Patient has an appointment 09/01/20 for Dexcom. Update 09/29/20:  PT has not started yet, Dexcom not working yet-encouraged patient's daughter to schedule appointment with provider for PT follow up and check Dexcom-also nutrition referral. Social Work referral for depression. Update 09/29/20:  Patient met with SW and has therapy appointment scheduled. Self Care Activities:  Patient will self administer medications as prescribed Patient will attend all scheduled provider appointments Patient will call pharmacy for medication refills Patient will attend church or other social activities Patient will continue to perform ADL's independently Patient will call provider office for new concerns or questions Patient will work Social Work to address care coordination needs and will continue to work with the clinical team to address health care and disease management related needs.   Patient Goals: In the next 30 days, patient will attend all provider appointments. In the next 30 days, patient will take medications as directed. In the next 30 days, patient will speak with Social Work and therapist.  Follow Up Plan: [name] has been provided with contact information for the care management team and has been advised to call with any health related questions or concerns.  The care management team will reach out to the patient/patient's daughter.again over the next 30 days.    Evidence-based guidance:   Review biopsychosocial determinants of health screens.  Review need for preventive screening based on age, sex, family history and health history.  Determine level of modifiable health risk.  Discuss identified risks.  Identify areas where behavior change may lead to improved health.  Promote healthy lifestyle.  Evoke change talk using open-ended  questions, pros and cons, as well as looking forward.  Identify and manage conditions or preconditions to reduce health risk.  Implement additional goals and interventions based on identified risk factors.

## 2020-09-29 NOTE — Patient Outreach (Signed)
Medicaid Managed Care   Nurse Care Manager Note  09/29/2020 Name:  Jennifer [name] MRN:  [id number] DOB:  1970-11-13  Jennifer [name] is an 50 y.o. year old female who is a primary patient of Jose Persia, MD.  The [facility] Managed Care Coordination team was consulted for assistance with:    Chronic healthcare management needs.  [name] was given information about Medicaid Managed Care Coordination team services today. Jennifer [name] agreed to services and verbal consent obtained.  Engaged with patient by telephone for follow up visit in response to provider referral for case management and/or care coordination services.   Assessments/Interventions:  Review of past medical history, allergies, medications, health status, including review of consultants reports, laboratory and other test data, was performed as part of comprehensive evaluation and provision of chronic care management services.  SDOH (Social Determinants of Health) assessments and interventions performed:   Care Plan  No Known Allergies  Medications Reviewed Today     Reviewed by [name], RN (Registered Nurse) on 09/29/20 at [phone]  Med List Status: <None>   Medication Order Taking? Sig Documenting Provider Last Dose Status Informant  Accu-Chek FastClix Lancets MISC [id number] No Check blood sugar 4 times a day Jose Persia, MD Taking Active Child  ACCU-CHEK GUIDE test strip [id number] No CHECK BLOOD SUGAR 4 TIMES PER DAY Mosetta Anis, MD Taking Active   acetaminophen (TYLENOL) 500 MG tablet [id number] No Take 500 mg by mouth every 6 (six) hours as needed for mild pain. [provider] Taking Active Child  Patient not taking:  Discontinued 09/27/20 0116   Patient not taking:  Discontinued 09/27/20 0116   Blood Glucose Monitoring Suppl (ACCU-CHEK GUIDE) w/Device KIT [id number] No 1 each by Does not apply route 4 (four) times daily. Jose Persia, MD Taking Active Child  citalopram (CELEXA) 20 MG tablet  [id number]  TAKE 10MG (1 PILL) FOR 7 DAYS, THEN TAKE 2 TABS THEREAFTER. Jennifer Pope, MD  Active   Patient not taking:  Discontinued 09/27/20 0116   Patient not taking:  Discontinued 09/27/20 0116   ferrous sulfate 325 (65 FE) MG tablet [id number]  Take 1 tablet (325 mg total) by mouth every Monday, Wednesday, and Friday for 14 days. Mosetta Anis, MD  Expired 07/07/20 2359   Patient not taking:  Discontinued 09/27/20 0116   insulin aspart protamine - aspart (NOVOLOG MIX 70/30 FLEXPEN) (70-30) 100 UNIT/ML FlexPen [id number] No Inject 0.15-0.3 mLs (15-30 Units total) into the skin 2 (two) times daily. 30 unit morning, 15 unit supper Mosetta Anis, MD Taking Active   Insulin Pen Needle (B-D UF III MINI PEN NEEDLES) 31G X 5 MM MISC [id number] No Use pen needle daily for injections Mosetta Anis, MD Taking Active   metFORMIN (GLUCOPHAGE-XR) 500 MG 24 hr tablet [id number] No Take 1 tablet (500 mg total) by mouth 2 (two) times daily. [name], MD Taking Active   metoCLOPramide (REGLAN) 5 MG tablet [id number]  Take 1 tablet (5 mg total) by mouth 3 (three) times daily before meals. Jose Persia, MD  Active             Patient Active Problem List   Diagnosis Date Noted   Depression 08/22/2020   Weight loss, non-intentional 07/04/2020   Adult failure to thrive 07/04/2020   Nausea & vomiting 06/28/2020   Early satiety 05/31/2020   UTI (urinary tract infection) 05/16/2020   Elevated liver enzymes 05/15/2020   Closed trimalleolar fracture of right ankle 05/02/2020  Cellulitis (Left 2nd toe) 03/27/2020   Cellulitis of left foot 03/17/2020   AKI (acute kidney injury) ([place]) 03/14/2020   Hypotension 03/10/2020   Right ankle injury 09/28/2019   Bilateral lower extremity edema 06/07/2019   Atrial fibrillation ([place]) 09/20/2018   Migraine    Hematuria 08/24/2018   Vulvovaginitis 05/21/2018   (HFpEF) heart failure with preserved ejection fraction ([place]) 01/01/2018   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   High risk medication use 09/16/2017   Pseudophakia of right eye 09/16/2017   Left anterior shoulder pain 07/06/2017   Hypertension associated with diabetes ([place]) 07/23/2016   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 Heart failure, weight loss.   Care Plan : General Plan of Care (Adult)  Updates made by [name], RN since 09/29/2020 12:00 AM     Problem: Health Promotion or Disease Self-Management (General Plan of Care)   Priority: High  Onset Date: 08/29/2020     Long-Range Goal: Self-Management Plan Developed   Start Date: 08/29/2020  Expected End Date: 11/29/2020  Recent Progress: Not on track  Priority: High  Note:   Current Barriers:  Ineffective Self Health Maintenance Patient with diabetes, hypertension, prior ankle fracture with deconditioning, depression and ? 60 pound weight loss in 4 months. Currently UNABLE TO independently self manage needs related to chronic health conditions.  Knowledge Deficits related to short term plan for care coordination needs and long term plans for chronic disease management needs Nurse Case Manager Clinical Goal(s):  patient will work with care management team to address care coordination and chronic disease management needs related to Disease Management Educational Needs Care Coordination Medication Management and Education Medication Reconciliation Medication Assistance  Psychosocial Support Mental Health Counseling   Interventions:  Evaluation of current treatment plan and  patient's adherence to plan as established by provider. Reviewed medications with patient/patient's daughter. Collaborated with Social Work regarding depression. Discussed plans with patient for ongoing care management follow up and provided patient with direct contact information for care management team Reviewed scheduled/upcoming provider appointments.  PCP has ordered PT for patient.  Patient has an appointment 09/01/20 for Dexcom. Update 09/29/20:  PT has not started yet, Dexcom not working yet-encouraged patient's daughter to schedule appointment with provider for PT follow up and check Dexcom-also nutrition referral. Social Work referral for depression. Update 09/29/20:  Patient met with SW and has therapy appointment scheduled. Self Care Activities:  Patient will self administer medications as prescribed Patient will attend all scheduled provider appointments Patient will call pharmacy for medication refills Patient will attend church or other social activities Patient will continue to perform ADL's independently Patient will call provider office for new concerns or questions Patient will work Social Work to address care coordination needs and will continue to work with the clinical team to address health care and disease management related needs.   Patient Goals: In the next 30 days, patient will attend all provider appointments. In the next 30 days, patient will take medications as directed. In the next 30 days, patient will speak with Social Work and therapist.  Follow Up Plan: [name] has been provided with contact information for the care management team and has been advised to call with any health related questions or concerns.  The care management team will reach out to the patient/patient's daughter.again over the next 30 days.    Evidence-based guidance:   Review biopsychosocial determinants of health screens.  Review need for preventive screening based  on age, sex, family history and health history.  Determine level of modifiable health risk.  Discuss identified risks.  Identify areas where behavior change may lead to improved health.  Promote healthy lifestyle.  Evoke change talk using open-ended questions, pros and cons, as well as looking forward.  Identify and manage conditions or preconditions to reduce health risk.  Implement additional goals and interventions based on identified risk factors.      Follow Up:  Patient agrees to Care Plan and Follow-up.  Plan: The Managed Medicaid care management team will reach out to the patient again over the next 30 days. and The patient has been provided with contact information for the Managed Medicaid care management team and has been advised to call with any health related questions or concerns.  Date/time of next scheduled RN care management/care coordination outreach: 10/27/20 at 1245.

## 2020-10-02 ENCOUNTER — Encounter (HOSPITAL_COMMUNITY): Payer: Self-pay | Admitting: *Deleted

## 2020-10-02 ENCOUNTER — Emergency Department (HOSPITAL_COMMUNITY): Payer: Medicaid Other

## 2020-10-02 ENCOUNTER — Emergency Department (HOSPITAL_COMMUNITY)
Admission: EM | Admit: 2020-10-02 | Discharge: 2020-10-02 | Disposition: A | Discharge status: Home / Self Care | Payer: Medicaid Other | Attending: Emergency Medicine | Admitting: Emergency Medicine

## 2020-10-02 ENCOUNTER — Other Ambulatory Visit: Payer: Self-pay

## 2020-10-02 DIAGNOSIS — I10 Essential (primary) hypertension: Secondary | ICD-10-CM | POA: Diagnosis not present

## 2020-10-02 DIAGNOSIS — Z794 Long term (current) use of insulin: Secondary | ICD-10-CM | POA: Diagnosis not present

## 2020-10-02 DIAGNOSIS — E119 Type 2 diabetes mellitus without complications: Secondary | ICD-10-CM | POA: Diagnosis not present

## 2020-10-02 DIAGNOSIS — Z8616 Personal history of COVID-19: Secondary | ICD-10-CM | POA: Insufficient documentation

## 2020-10-02 DIAGNOSIS — R410 Disorientation, unspecified: Secondary | ICD-10-CM

## 2020-10-02 DIAGNOSIS — Z7984 Long term (current) use of oral hypoglycemic drugs: Secondary | ICD-10-CM | POA: Diagnosis not present

## 2020-10-02 DIAGNOSIS — R4182 Altered mental status, unspecified: Secondary | ICD-10-CM | POA: Insufficient documentation

## 2020-10-02 DIAGNOSIS — E860 Dehydration: Secondary | ICD-10-CM | POA: Insufficient documentation

## 2020-10-02 LAB — COMPREHENSIVE METABOLIC PANEL
ALT: 13 U/L (ref 0–44)
AST: 18 U/L (ref 15–41)
Albumin: 3.6 g/dL (ref 3.5–5.0)
Alkaline Phosphatase: 63 U/L (ref 38–126)
Anion gap: 9 (ref 5–15)
BUN: 30 mg/dL — ABNORMAL HIGH (ref 6–20)
CO2: 27 mmol/L (ref 22–32)
Calcium: 10.1 mg/dL (ref 8.9–10.3)
Chloride: 101 mmol/L (ref 98–111)
Creatinine, Ser: 1.18 mg/dL — ABNORMAL HIGH (ref 0.44–1.00)
GFR, Estimated: 57 mL/min — ABNORMAL LOW (ref >60)
Glucose, Bld: 149 mg/dL — ABNORMAL HIGH (ref 70–99)
Potassium: 4 mmol/L (ref 3.5–5.1)
Sodium: 137 mmol/L (ref 135–145)
Total Bilirubin: 0.3 mg/dL (ref 0.3–1.2)
Total Protein: 7.8 g/dL (ref 6.5–8.1)

## 2020-10-02 LAB — CBC WITH DIFFERENTIAL/PLATELET
Abs Immature Granulocytes: 0.02 10*3/uL (ref 0.00–0.07)
Basophils Absolute: 0 10*3/uL (ref 0.0–0.1)
Basophils Relative: 0 %
Eosinophils Absolute: 0.1 10*3/uL (ref 0.0–0.5)
Eosinophils Relative: 2 %
HCT: 37.4 % (ref 36.0–46.0)
Hemoglobin: 12 g/dL (ref 12.0–15.0)
Immature Granulocytes: 0 %
Lymphocytes Relative: 31 %
Lymphs Abs: 2.1 10*3/uL (ref 0.7–4.0)
MCH: 27.6 pg (ref 26.0–34.0)
MCHC: 32.1 g/dL (ref 30.0–36.0)
MCV: 86.2 fL (ref 80.0–100.0)
Monocytes Absolute: 0.3 10*3/uL (ref 0.1–1.0)
Monocytes Relative: 4 %
Neutro Abs: 4.2 10*3/uL (ref 1.7–7.7)
Neutrophils Relative %: 63 %
Platelets: 183 10*3/uL (ref 150–400)
RBC: 4.34 MIL/uL (ref 3.87–5.11)
RDW: 12.2 % (ref 11.5–15.5)
WBC: 6.8 10*3/uL (ref 4.0–10.5)
nRBC: 0 % (ref 0.0–0.2)

## 2020-10-02 LAB — URINALYSIS, ROUTINE W REFLEX MICROSCOPIC
Bacteria, UA: NONE SEEN
Bilirubin Urine: NEGATIVE
Glucose, UA: >=500 mg/dL — AB
Ketones, ur: NEGATIVE mg/dL
Nitrite: NEGATIVE
Protein, ur: 30 mg/dL — AB
Specific Gravity, Urine: 1.017 (ref 1.005–1.030)
WBC, UA: >50 WBC/hpf — ABNORMAL HIGH (ref 0–5)
pH: 6 (ref 5.0–8.0)

## 2020-10-02 LAB — CBG MONITORING, ED: Glucose-Capillary: 114 mg/dL — ABNORMAL HIGH (ref 70–99)

## 2020-10-02 LAB — RAPID URINE DRUG SCREEN, HOSP PERFORMED
Amphetamines: NOT DETECTED
Barbiturates: NOT DETECTED
Benzodiazepines: NOT DETECTED
Cocaine: NOT DETECTED
Opiates: NOT DETECTED
Tetrahydrocannabinol: NOT DETECTED

## 2020-10-02 MED ORDER — ACETAMINOPHEN 325 MG PO TABS
650.0000 mg | ORAL_TABLET | Freq: Once | ORAL | Status: AC
  Administered 2020-10-02: 650 mg via ORAL
  Filled 2020-10-02: qty 2

## 2020-10-02 MED ORDER — SODIUM CHLORIDE 0.9 % IV BOLUS
1000.0000 mL | Freq: Once | INTRAVENOUS | Status: AC
  Administered 2020-10-02: 1000 mL via INTRAVENOUS

## 2020-10-02 NOTE — ED Triage Notes (Signed)
Per EMS, pt from home. Son noticed she wasn't responding appropriately. In the past this has happened w/ hyperglycemia. CBG 95, BP 122/84. Alert and oriented x 3. He reports she has increased stress recently. Son also reports eating/drinking little for the past 5 months.   Pt speaks Arabic.   12 lead unremarkable SpO2 99

## 2020-10-02 NOTE — ED Provider Notes (Signed)
[facility] DEPT Provider Note   CSN: [id number] Arrival date & time: 10/02/20  1651     History Chief Complaint  Patient presents with   Altered Mental Status  Level 5 caveat due to mental status change.  Jennifer [name] is a 50 y.o. female.  The history is provided by the patient and a relative.  Altered Mental Status Presenting symptoms: confusion   Patient brought in for mental status change.  Patient speaks Arabic primarily but son wanted to translate.  Offered Child psychotherapist but he would rather do it on his own.  States he brought her in because she is likely dehydrated and has been more confused.  States has had a headache and has not been eating much.  Confused today.  States she did not know answers questions she should have known.  Has been moving all extremities.  States she is doing little better than she was.  Been throwing up for a couple days.  States recently seen in the ER which appears to be about a week ago and diagnosed with dehydration.  Did have a somewhat recent right ankle surgery.  Potentially is on pain meds but son cannot really tell me what she has been taking or if she has been taking it.    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  She has not seen a dentist in about one year.  Dentist is on [address].  No fevers chills or systemic  symptoms.  Diabetes has been poorly controlled for some time.  She said it has improved recently averaging around 140.  Called the dentist said they would not see patients until May 14th but the urgency o   Pap smear abnormality of cervix with LGSIL    Routine/ritual circumcision    Type II diabetes mellitus ([place])    Uveitis     Patient Active Problem List   Diagnosis Date Noted   Depression 08/22/2020   Weight loss, non-intentional 07/04/2020   Adult failure to thrive 07/04/2020   Nausea & vomiting 06/28/2020   Early satiety 05/31/2020   UTI (urinary tract infection) 05/16/2020   Elevated liver enzymes 05/15/2020   Closed trimalleolar fracture of right ankle 05/02/2020   Cellulitis (Left 2nd toe) 03/27/2020   Cellulitis of left foot 03/17/2020   AKI (acute kidney injury) ([place]) 03/14/2020   Hypotension 03/10/2020   Right ankle injury 09/28/2019   Bilateral lower extremity edema 06/07/2019   Atrial fibrillation ([place]) 09/20/2018   Migraine    Hematuria 08/24/2018   Vulvovaginitis 05/21/2018   (HFpEF) heart failure with preserved ejection fraction ([place]) 01/01/2018   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   High risk medication use 09/16/2017   Pseudophakia of right eye 09/16/2017   Left anterior shoulder pain 07/06/2017   Hypertension associated with diabetes ([place]) 07/23/2016   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   Fatigue 04/02/2016   Uveitic glaucoma of left eye, 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   Vitamin D deficiency 10/30/2014   Diabetic neuropathy ([place]) 05/13/2013   Iron deficiency anemia 05/13/2013   Hair loss 03/24/2012   Healthcare maintenance 02/26/2011   Hyperlipidemia 05/05/2006   Type II diabetes mellitus ([place]) 04/22/1998    Past Surgical History:  Procedure Laterality Date   CESAREAN SECTION     x 1   EYE SURGERY Bilateral    Lasik   MULTIPLE TOOTH EXTRACTIONS     bottom denture   ORIF ANKLE FRACTURE Right 05/05/2020   Procedure: OPEN REDUCTION INTERNAL FIXATION  (ORIF) ANKLE FRACTURE;  Surgeon: Shona Needles, MD;  Location: [facility];  Service: Orthopedics;  Laterality: Right;     OB History     Gravida  7   Para  4   Term  4   Preterm  0   AB  3   Living         SAB  3   IAB  0   Ectopic  0   Multiple      Live Births              Family History  Problem Relation Age of Onset   Diabetes Father    Hypertension Other     Social History   Tobacco Use   Smoking status: Never   Smokeless tobacco: Never  Vaping Use   Vaping Use: Never used  Substance Use Topics   Alcohol use: No   Drug use: No    Home Medications Prior to Admission medications   Medication Sig Start Date End Date Taking? Authorizing Provider  acetaminophen (TYLENOL) 500 MG tablet Take 500 mg by mouth every 6 (six) hours as needed for mild pain.   Yes [provider]  insulin aspart protamine - aspart (NOVOLOG MIX 70/30 FLEXPEN) (70-30) 100 UNIT/ML FlexPen Inject 0.15-0.3 mLs (15-30 Units total) into the skin 2 (two) times daily. 30 unit morning, 15 unit supper 06/23/20  Yes Mosetta Anis, MD  metFORMIN (GLUCOPHAGE-XR) 500 MG 24 hr tablet Take 1 tablet (500 mg total) by mouth 2 (two) times daily. 08/22/20  Yes [name], MD  Accu-Chek FastClix Lancets MISC Check blood sugar 4 times a day 06/07/19   Jose Persia, MD  ACCU-CHEK GUIDE test strip CHECK BLOOD SUGAR 4 TIMES PER DAY 06/23/20   Mosetta Anis, MD  Blood Glucose Monitoring Suppl (ACCU-CHEK GUIDE) w/Device KIT 1 each by Does not apply route 4 (four) times daily. 11/19/18   Jose Persia, MD  citalopram (CELEXA) 20 MG tablet TAKE 10MG (1 PILL) FOR 7 DAYS, THEN TAKE 2 TABS THEREAFTER. Patient not taking: Reported on 10/02/2020 09/01/20   [name], MD  ferrous sulfate 325 (65 FE) MG tablet Take 1 tablet (325 mg total) by mouth every Monday, Wednesday, and Friday for 14 days. Patient not taking: Reported on 10/02/2020 06/23/20 07/07/20  Mosetta Anis, MD  Insulin Pen Needle (B-D UF  III MINI PEN NEEDLES) 31G X 5 MM MISC Use pen needle daily for injections 06/23/20   Mosetta Anis, MD  metoCLOPramide (REGLAN) 5 MG tablet Take 1 tablet (5 mg total) by mouth 3 (three) times daily before meals. Patient not taking: Reported on 10/02/2020 09/01/20   Jose Persia, MD  apixaban (ELIQUIS) 5 MG TABS tablet Take 1 tablet (5 mg total) by mouth 2 (two) times daily. Patient not taking: Reported on  08/29/2020 06/23/20 09/27/20  Mosetta Anis, MD  atorvastatin (LIPITOR) 40 MG tablet Take 1 tablet (40 mg total) by mouth daily. Patient not taking: Reported on 08/29/2020 06/23/20 09/27/20  Mosetta Anis, MD  Continuous Blood Gluc Sensor (DEXCOM G6 SENSOR) MISC Use to check blood sugar at least 6 times a day Patient not taking: Reported on 08/29/2020 08/09/20 09/27/20  Harvie Heck, MD  Continuous Blood Gluc Transmit (DEXCOM G6 TRANSMITTER) MISC Use to check blood sugar at least 6 times a day Patient not taking: Reported on 08/29/2020 08/09/20 09/27/20  Harvie Heck, MD  gabapentin (NEURONTIN) 300 MG capsule Take 1 capsule (300 mg total) by mouth at bedtime. Patient not taking: Reported on 08/29/2020 08/09/20 09/27/20  Harvie Heck, MD    Allergies    Patient has no known allergies.  Review of Systems   Review of Systems  Unable to perform ROS: Mental status change  Constitutional:  Negative for appetite change.  Respiratory:  Negative for shortness of breath.   Psychiatric/Behavioral:  Positive for confusion.    Physical Exam Updated Vital Signs BP 135/76   Pulse 85   Temp 98.8 F (37.1 C) (Oral)   Resp 18   LMP 08/21/2016 (Exact Date)   SpO2 99%   Physical Exam Vitals reviewed.  Constitutional:      Appearance: Normal appearance.  HENT:     Head: Atraumatic.     Mouth/Throat:     Mouth: Mucous membranes are moist.  Eyes:     Comments: Blind in left eye chronically.  Eye movements intact.  Pupil on right responsive.  Cardiovascular:     Rate and Rhythm: Normal rate.  Pulmonary:     Breath  sounds: No wheezing or rhonchi.  Abdominal:     Tenderness: There is no abdominal tenderness.  Musculoskeletal:        General: No tenderness.     Cervical back: Neck supple.  Skin:    General: Skin is warm.     Capillary Refill: Capillary refill takes less than 2 seconds.  Neurological:     Comments: There awake and moves all extremities, somewhat slow to answer.  Still some confusion per son.    ED Results / Procedures / Treatments   Labs (all labs ordered are listed, but only abnormal results are displayed) Labs Reviewed  URINALYSIS, ROUTINE W REFLEX MICROSCOPIC - Abnormal; Notable for the following components:      Result Value   APPearance HAZY (*)    Glucose, UA >=500 (*)    Hgb urine dipstick MODERATE (*)    Protein, ur 30 (*)    Leukocytes,Ua LARGE (*)    WBC, UA >50 (*)    All other components within normal limits  COMPREHENSIVE METABOLIC PANEL - Abnormal; Notable for the following components:   Glucose, Bld 149 (*)    BUN 30 (*)    Creatinine, Ser 1.18 (*)    GFR, Estimated 57 (*)    All other components within normal limits  CBG MONITORING, ED - Abnormal; Notable for the following components:   Glucose-Capillary 114 (*)    All other components within normal limits  RAPID URINE DRUG SCREEN, HOSP PERFORMED  CBC WITH DIFFERENTIAL/PLATELET    EKG None  Radiology CT Head Wo Contrast  Result Date: 10/02/2020 CLINICAL DATA:  Delirium EXAM: CT HEAD WITHOUT CONTRAST TECHNIQUE: Contiguous axial images were obtained from the base of the skull through the vertex without intravenous contrast. COMPARISON:  CT brain 09/26/2020, 03/08/2020, 09/19/2018  FINDINGS: Brain: No acute territorial infarction, hemorrhage or intracranial mass. Mild atrophy. Minimal white matter hypodensity likely chronic small vessel ischemic change. Slightly prominent ventricles, stable since 09/26/2020, slightly enlarged as compared with 03/08/2020 and 09/19/2018. Vascular: No hyperdense vessels.  No  unexpected calcification Skull: Normal. Negative for fracture or focal lesion. Sinuses/Orbits: No acute finding. Other: None IMPRESSION: 1. No CT evidence for acute intracranial abnormality. Mild atrophy and chronic small vessel ischemic changes of the white matter. 2. Slight ventricular enlargement since 2021, degree of atrophy does not appear significantly changed and question normal pressure hydrocephalus Electronically Signed   By: Donavan Foil M.D.   On: 10/02/2020 18:26   DG Chest Portable 1 View  Result Date: 10/02/2020 CLINICAL DATA:  Altered mental status. EXAM: PORTABLE CHEST 1 VIEW COMPARISON:  04/08/2020 FINDINGS: The heart size and mediastinal contours are within normal limits. Both lungs are clear. The visualized skeletal structures are unremarkable. IMPRESSION: No active disease. Electronically Signed   By: Lucienne Capers M.D.   On: 10/02/2020 18:13    Procedures Procedures   Medications Ordered in ED Medications  sodium chloride 0.9 % bolus 1,000 mL (0 mLs Intravenous Stopped 10/02/20 2229)  acetaminophen (TYLENOL) tablet 650 mg (650 mg Oral Given 10/02/20 1850)    ED Course  I have reviewed the triage vital signs and the nursing notes.  Pertinent labs & imaging results that were available during my care of the patient were reviewed by me and considered in my medical decision making (see chart for details).    MDM Rules/Calculators/A&P                          Patient with episodes of mental status changes.  Appears more confused at times.  Mildly confused upon arrival improved.  Patient's son states that he has been talking to her now she appears back to baseline.  Reviewed head CT and lab work.  Overall reassuring.  BUN/creatinine mildly elevated but appear this way on recent labs.  Discussed with patient about potential admission versus outpatient work-up but I think patient would benefit more from an outpatient work-up.  Initial hypotension has resolved with some fluids.   Doubt a sepsis.  Discharged home to follow-up with outpatient clinic.  Final Clinical Impression(s) / ED Diagnoses Final diagnoses:  Confusion  Dehydration    Rx / DC Orders ED Discharge Orders     None        Davonna Belling, MD 10/02/20 2338

## 2020-10-02 NOTE — ED Notes (Addendum)
An After Visit Summary was printed and given to the patient. Discharge instructions given and no further questions at this time.  Pt leaving with family.  

## 2020-10-05 ENCOUNTER — Telehealth: Payer: Self-pay | Admitting: Licensed Clinical Social Worker

## 2020-10-05 ENCOUNTER — Ambulatory Visit: Payer: Self-pay

## 2020-10-05 NOTE — Patient Instructions (Signed)
[name] ,   [facility] Managed Care Team is available to provide assistance to you with your healthcare needs at no cost and as a benefit of your [facility] Health plan. Please reach out to me at the number below. I am available to be of assistance to you regarding your healthcare needs. .   Thank you,   Eula Fried, BSW, MSW, LCSW Managed Medicaid [email or web address] Phone: [phone]

## 2020-10-05 NOTE — Patient Outreach (Signed)
[facility]) Care Management  [facility] Social Work  10/05/2020  Jennifer [name] 03/30/1971 [id number]   Encounter Medications:  Outpatient Encounter Medications as of 10/05/2020  Medication Sig   Accu-Chek FastClix Lancets MISC Check blood sugar 4 times a day   ACCU-CHEK GUIDE test strip CHECK BLOOD SUGAR 4 TIMES PER DAY   acetaminophen (TYLENOL) 500 MG tablet Take 500 mg by mouth every 6 (six) hours as needed for mild pain.   Blood Glucose Monitoring Suppl (ACCU-CHEK GUIDE) w/Device KIT 1 each by Does not apply route 4 (four) times daily.   citalopram (CELEXA) 20 MG tablet TAKE 10MG (1 PILL) FOR 7 DAYS, THEN TAKE 2 TABS THEREAFTER. (Patient not taking: Reported on 10/02/2020)   ferrous sulfate 325 (65 FE) MG tablet Take 1 tablet (325 mg total) by mouth every Monday, Wednesday, and Friday for 14 days. (Patient not taking: Reported on 10/02/2020)   insulin aspart protamine - aspart (NOVOLOG MIX 70/30 FLEXPEN) (70-30) 100 UNIT/ML FlexPen Inject 0.15-0.3 mLs (15-30 Units total) into the skin 2 (two) times daily. 30 unit morning, 15 unit supper   Insulin Pen Needle (B-D UF III MINI PEN NEEDLES) 31G X 5 MM MISC Use pen needle daily for injections   metFORMIN (GLUCOPHAGE-XR) 500 MG 24 hr tablet Take 1 tablet (500 mg total) by mouth 2 (two) times daily.   metoCLOPramide (REGLAN) 5 MG tablet Take 1 tablet (5 mg total) by mouth 3 (three) times daily before meals. (Patient not taking: Reported on 10/02/2020)   [DISCONTINUED] apixaban (ELIQUIS) 5 MG TABS tablet Take 1 tablet (5 mg total) by mouth 2 (two) times daily. (Patient not taking: Reported on 08/29/2020)   [DISCONTINUED] atorvastatin (LIPITOR) 40 MG tablet Take 1 tablet (40 mg total) by mouth daily. (Patient not taking: Reported on 08/29/2020)   [DISCONTINUED] Continuous Blood Gluc Sensor (DEXCOM G6 SENSOR) MISC Use to check blood sugar at least 6 times a day (Patient not taking: Reported on 08/29/2020)   [DISCONTINUED] Continuous Blood  Gluc Transmit (DEXCOM G6 TRANSMITTER) MISC Use to check blood sugar at least 6 times a day (Patient not taking: Reported on 08/29/2020)   [DISCONTINUED] gabapentin (NEURONTIN) 300 MG capsule Take 1 capsule (300 mg total) by mouth at bedtime. (Patient not taking: Reported on 08/29/2020)   No facility-administered encounter medications on file as of 10/05/2020.    Functional Status:  In your present state of health, do you have any difficulty performing the following activities: 09/01/2020 08/22/2020  Hearing? N N  Vision? Y Y  Difficulty concentrating or making decisions? Y N  Comment AT TIMES -  Walking or climbing stairs? Y Y  Comment - blind  Dressing or bathing? Y N  Comment - -  Doing errands, shopping? N Y  Comment - blind  Some recent data might be hidden    Fall/Depression Screening:  PHQ 2/9 Scores 09/01/2020 08/09/2020 07/03/2020 04/27/2020 03/27/2020 03/10/2020 02/08/2020  PHQ - 2 Score 2 0 0 0 - 6 2  PHQ- 9 Score 13 0 0 - - 25 3  Exception Documentation - - - - Other- indicate reason in comment box - -    Assessment:  Care Plan There are no care plans that you recently modified to display for this patient.  LCSW completed [facility] outreach attempt today but was unable to reach patient successfully. A HIPPA compliant voice message was unable to be left encouraging patient to return call once available as "all circuits are busy." LCSW will reschedule patient's [facility] Social  Work appointment within 14 days if no return call has been made.  Eula Fried, BSW, MSW, [employer] Managed Medicaid [email or web address] Phone: [phone]

## 2020-10-24 ENCOUNTER — Encounter: Payer: Self-pay | Admitting: *Deleted

## 2020-10-24 ENCOUNTER — Other Ambulatory Visit: Payer: Self-pay | Admitting: Licensed Clinical Social Worker

## 2020-10-24 NOTE — Patient Outreach (Signed)
Medicaid Managed Care Social Work Note  10/24/2020 Name:  Jennifer [name] MRN:  [id number] DOB:  08-05-1970  Jennifer [name] is an 50 y.o. year old female who is a primary patient of Jose Persia, MD.  The Medicaid Managed Care Coordination team was consulted for assistance with:  [place] and Resources  [name] was given information about Medicaid Managed CareCoordination services today. [name] agreed to services and verbal consent obtained.  Engaged with patient  for by telephone forfollow up visit in response to referral for case management and/or care coordination services.   Assessments/Interventions:  Review of past medical history, allergies, medications, health status, including review of consultants reports, laboratory and other test data, was performed as part of comprehensive evaluation and provision of chronic care management services.  SDOH: (Social Determinant of Health) assessments and interventions performed: SDOH Interventions    Flowsheet Row Most Recent Value  SDOH Interventions   SDOH Interventions for the Following Domains Depression  Depression Interventions/Treatment  Referral to Psychiatry       Advanced Directives Status:  See Care Plan for related entries.  Care Plan                 No Known Allergies  Medications Reviewed Today     Reviewed by Victoriano Lain, CPhT (Pharmacy Technician) on 10/02/20 at 2156  Med List Status: Complete   Medication Order Taking? Sig Documenting Provider Last Dose Status Informant  Accu-Chek FastClix Lancets MISC [id number]  Check blood sugar 4 times a day Jose Persia, MD  Active Child  ACCU-CHEK GUIDE test strip [id number]  CHECK BLOOD SUGAR 4 TIMES PER DAY Mosetta Anis, MD  Active Child  acetaminophen (TYLENOL) 500 MG tablet [id number] Yes Take 500 mg by mouth every 6 (six) hours as needed for mild pain. [provider] Past Month Active Child  Blood Glucose Monitoring Suppl  (ACCU-CHEK GUIDE) w/Device KIT [id number]  1 each by Does not apply route 4 (four) times daily. Jose Persia, MD  Active Child  citalopram (CELEXA) 20 MG tablet [id number] No TAKE 10MG (1 PILL) FOR 7 DAYS, THEN TAKE 2 TABS THEREAFTER.  Patient not taking: Reported on 10/02/2020   [name], MD Not Taking Consider Medication Status and Discontinue (Discontinued by provider) Child  ferrous sulfate 325 (65 FE) MG tablet [id number] No Take 1 tablet (325 mg total) by mouth every Monday, Wednesday, and Friday for 14 days.  Patient not taking: Reported on 10/02/2020   Mosetta Anis, MD Not Taking Expired 07/07/20 2359   insulin aspart protamine - aspart (NOVOLOG MIX 70/30 FLEXPEN) (70-30) 100 UNIT/ML FlexPen [id number] Yes Inject 0.15-0.3 mLs (15-30 Units total) into the skin 2 (two) times daily. 30 unit morning, 15 unit supper Mosetta Anis, MD 10/02/2020 Active Child  Insulin Pen Needle (B-D UF III MINI PEN NEEDLES) 31G X 5 MM MISC [id number]  Use pen needle daily for injections Mosetta Anis, MD  Active Child  metFORMIN (GLUCOPHAGE-XR) 500 MG 24 hr tablet [id number] Yes Take 1 tablet (500 mg total) by mouth 2 (two) times daily. [name], MD 10/01/2020 Active Child  metoCLOPramide (REGLAN) 5 MG tablet [id number] No Take 1 tablet (5 mg total) by mouth 3 (three) times daily before meals.  Patient not taking: Reported on 10/02/2020   Jose Persia, MD Not Taking Consider Medication Status and Discontinue (Completed Course) Child  Med List Note Victoriano Lain, CPhT 10/02/20 2155): Daughter can help  Patient Active Problem List   Diagnosis Date Noted   Depression 08/22/2020   Weight loss, non-intentional 07/04/2020   Adult failure to thrive 07/04/2020   Nausea & vomiting 06/28/2020   Early satiety 05/31/2020   UTI (urinary tract infection) 05/16/2020   Elevated liver enzymes 05/15/2020   Closed trimalleolar fracture of right ankle 05/02/2020   Cellulitis (Left 2nd  toe) 03/27/2020   Cellulitis of left foot 03/17/2020   AKI (acute kidney injury) ([place]) 03/14/2020   Hypotension 03/10/2020   Right ankle injury 09/28/2019   Bilateral lower extremity edema 06/07/2019   Atrial fibrillation ([place]) 09/20/2018   Migraine    Hematuria 08/24/2018   Vulvovaginitis 05/21/2018   (HFpEF) heart failure with preserved ejection fraction ([place]) 01/01/2018   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   High risk medication use 09/16/2017   Pseudophakia of right eye 09/16/2017   Left anterior shoulder pain 07/06/2017   Hypertension associated with diabetes ([place]) 07/23/2016   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   Fatigue 04/02/2016   Uveitic glaucoma of left eye, 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   Vitamin D deficiency 10/30/2014   Diabetic neuropathy ([place]) 05/13/2013   Iron deficiency anemia 05/13/2013   Hair loss 03/24/2012   Healthcare maintenance 02/26/2011   Hyperlipidemia 05/05/2006   Type II diabetes mellitus ([place]) 04/22/1998    Conditions to be addressed/monitored per PCP order:  Anxiety and Depression  Care Plan : LCSW Plan of Care  Updates made by Greg Cutter, LCSW since 10/24/2020 12:00 AM     Problem: Depression Identification (Depression)      Long-Range Goal: Depressive Symptoms Identified   Start Date: 09/01/2020  Expected End Date: 11/01/2020  Priority: High  Note:   Timeframe:  Long-Range Goal Priority:  High Start Date:    09/01/20                         Expected End Date:  ongoing                   Follow Up Date- 11/03/20  Current barriers:   Chronic Mental Health needs related to depression Limited social support, Mental Health Concerns , and language barrier as patient speaks Arabic Needs Support, Education, and Care Coordination in order to meet unmet mental health needs.  Clinical Goal(s): Over the next 120 days, patient will work with SW to reduce or manage symptoms of anxiety, depression, and stress and increase knowledge and/or ability  of: coping skills, healthy habits, self-management skills, and stress reduction.until connected for ongoing counseling.  Clinical Interventions:  Assessed patient's previous treatment, needs, coping skills, current treatment, support system and barriers to care  [facility] LCSW contact patient's main number through an Arabic interpreter but patient reports that she prefers that all communication and calls go through her daughter who is a great advocate for her and can speak [place].  Patient reports recent stress because her grandson is in the ICU and she was notified that a close friend of hers has cancer.  Other interventions: Depression screen reviewed , Solution-Focused Strategies, Behavioral Activation, Motivational Interviewing, Brief CBT , Provided basic mental health support, education  , and Suicidal Ideation/Homicidal Ideation assessed: ; Patient interviewed and appropriate assessments performed Discussed plans with patient for ongoing care management follow up and provided patient with direct contact information for care management team Assisted patient/caregiver with obtaining information about health plan benefits Provided education and assistance to client regarding Advanced Directives. Referred patient to  [facility] (mental health provider) for long term follow up and therapy/counseling Discussed several options for long term counseling based on need and insurance.  Assisted patient with narrowing the options down to [facility] ) Referral placed on on 09/01/20 for therapy and psychiatry. However, only counseling appointment was scheduled for 11/02/20. Daughter reports that patient wants to wait for psychiatry appointment once she completes her initial therapy appointment. [facility] LCSW sent secure message to Ar[place]us Leas asking if counseling appointment is virtual or in person.  Inter-disciplinary care team collaboration (see longitudinal plan  of care)  Patient Goals/Self-Care Activities: Over the next 120 days - avoid negative self-talk - develop a personal safety plan - develop a plan to deal with triggers like holidays, anniversaries - exercise at least 2 to 3 times per week - have a plan for how to handle bad days - journal feelings and what helps to feel better or worse - spend time or talk with others at least 2 to 3 times per week - spend time or talk with others every day - watch for early signs of feeling worse - write in journal every day - begin personal counseling - call and visit an old friend - check out volunteer opportunities - join a support group - laugh; watch a funny movie or comedian - learn and use visualization or guided imagery - perform a random act of kindness - practice relaxation or meditation daily - start or continue a personal journal - talk about feelings with a friend, family or spiritual advisor - practice positive thinking and self-talk Call [facility] if you want to schedule a psychiatry appointment          Follow up:  Patient agrees to Care Plan and Follow-up.  Plan: The Managed Medicaid care management team will reach out to the patient again over the next 14 days.  Date of next scheduled Social Work care management/care coordination outreach:  11/03/20  Eula Fried, BSW, MSW, LCSW Managed Medicaid LCSW [email or web address] Phone: [phone]

## 2020-10-24 NOTE — Patient Instructions (Signed)
Visit Information  Jennifer [name] was given information about Medicaid Managed Care team care coordination services as a part of their [place] Medicaid benefit. Jennifer [name] verbally consented to engagement with the [facility] Managed Care team.   For questions related to your [facility], please call: [phone] or visit the homepage here: [email or web address]  If you would like to schedule transportation through your [facility], please call the following number at least 2 days in advance of your appointment: [phone].   Call the [place] at [phone], at any time, 24 hours a day, 7 days a week. If you are in danger or need immediate medical attention call 911.  Jennifer [name] - following are the goals we discussed in your visit today:   Goals Addressed             This Visit's Progress    Manage My Emotions       Timeframe:  Long-Range Goal Priority:  High Start Date:    09/01/20                         Expected End Date:  ongoing                   Follow Up Date- 11/03/20   - begin personal counseling - call and visit an old friend - check out volunteer opportunities - join a support group - laugh; watch a funny movie or comedian - learn and use visualization or guided imagery - perform a random act of kindness - practice relaxation or meditation daily - start or continue a personal journal - talk about feelings with a friend, family or spiritual advisor - practice positive thinking and self-talk    Why is this important?   When you are stressed, down or upset, your body reacts too.  For example, your blood pressure may get higher; you may have a headache or stomachache.  When your emotions get the best of you, your body's ability to fight off cold and flu gets weak.  These steps will help you manage your emotions.      Notes:          Eula Fried, BSW, MSW, [employer] [facility] Managed Medicaid LCSW [email or web address] Phone: [phone]

## 2020-10-27 ENCOUNTER — Ambulatory Visit: Payer: Medicaid Other

## 2020-11-02 ENCOUNTER — Telehealth (HOSPITAL_COMMUNITY): Payer: Self-pay | Admitting: Licensed Clinical Social Worker

## 2020-11-02 ENCOUNTER — Other Ambulatory Visit: Payer: Self-pay

## 2020-11-02 ENCOUNTER — Ambulatory Visit (HOSPITAL_COMMUNITY): Payer: Medicaid Other | Admitting: Licensed Clinical Social Worker

## 2020-11-02 NOTE — Telephone Encounter (Signed)
LCSW sent text link for video session after getting Arabic interpreter activated. Interpreter ID is [id number], Hussain. When pt fails to sign on for session interpreter calls pt. Call goes directly to an outgoing message saying not accepting calls. No ability to leave a message.

## 2020-11-03 ENCOUNTER — Telehealth: Payer: Self-pay | Admitting: Licensed Clinical Social Worker

## 2020-11-03 ENCOUNTER — Ambulatory Visit: Payer: Self-pay

## 2020-11-03 NOTE — Patient Instructions (Signed)
[name] ,   The [facility] Managed Care Team is available to provide assistance to you with your healthcare needs at no cost and as a benefit of your [facility] plan. Please reach out to me at the number below. I am available to be of assistance to you regarding your healthcare needs. .   Thank you,   Eula Fried, BSW, MSW, LCSW Managed Medicaid LCSW [email or web address] Phone: [phone]

## 2020-11-03 NOTE — Patient Outreach (Signed)
[facility]) Care Management  11/03/2020  Jennifer [name] 1971-01-21 [id number]  LCSW completed [facility] outreach attempt today through [place] but was unable to reach patient successfully at either number. A HIPPA compliant voice message was unable to be left encouraging patient to return call once available. LCSW will reschedule patient's [facility] Social Work appointment if no return call has been made.  Eula Fried, BSW, MSW, [employer] Managed Medicaid LCSW [email or web address] Phone: [phone]

## 2020-11-07 ENCOUNTER — Other Ambulatory Visit: Payer: Self-pay

## 2020-11-07 ENCOUNTER — Emergency Department (HOSPITAL_COMMUNITY)
Admission: EM | Admit: 2020-11-07 | Discharge: 2020-11-08 | Disposition: A | Discharge status: Home / Self Care | Payer: Medicaid Other | Attending: Emergency Medicine | Admitting: Emergency Medicine

## 2020-11-07 ENCOUNTER — Emergency Department (HOSPITAL_COMMUNITY): Payer: Medicaid Other

## 2020-11-07 ENCOUNTER — Encounter (HOSPITAL_COMMUNITY): Payer: Self-pay | Admitting: Emergency Medicine

## 2020-11-07 DIAGNOSIS — Z7984 Long term (current) use of oral hypoglycemic drugs: Secondary | ICD-10-CM | POA: Diagnosis not present

## 2020-11-07 DIAGNOSIS — Z794 Long term (current) use of insulin: Secondary | ICD-10-CM | POA: Diagnosis not present

## 2020-11-07 DIAGNOSIS — I10 Essential (primary) hypertension: Secondary | ICD-10-CM | POA: Insufficient documentation

## 2020-11-07 DIAGNOSIS — N9489 Other specified conditions associated with female genital organs and menstrual cycle: Secondary | ICD-10-CM | POA: Diagnosis not present

## 2020-11-07 DIAGNOSIS — E1140 Type 2 diabetes mellitus with diabetic neuropathy, unspecified: Secondary | ICD-10-CM | POA: Insufficient documentation

## 2020-11-07 DIAGNOSIS — I4891 Unspecified atrial fibrillation: Secondary | ICD-10-CM | POA: Insufficient documentation

## 2020-11-07 DIAGNOSIS — Z8616 Personal history of COVID-19: Secondary | ICD-10-CM | POA: Diagnosis not present

## 2020-11-07 DIAGNOSIS — Z79899 Other long term (current) drug therapy: Secondary | ICD-10-CM | POA: Diagnosis not present

## 2020-11-07 DIAGNOSIS — G629 Polyneuropathy, unspecified: Secondary | ICD-10-CM

## 2020-11-07 DIAGNOSIS — R531 Weakness: Secondary | ICD-10-CM | POA: Diagnosis present

## 2020-11-07 LAB — PROTIME-INR
INR: 1 (ref 0.8–1.2)
Prothrombin Time: 13.1 seconds (ref 11.4–15.2)

## 2020-11-07 LAB — COMPREHENSIVE METABOLIC PANEL
ALT: 12 U/L (ref 0–44)
AST: 13 U/L — ABNORMAL LOW (ref 15–41)
Albumin: 3.4 g/dL — ABNORMAL LOW (ref 3.5–5.0)
Alkaline Phosphatase: 71 U/L (ref 38–126)
Anion gap: 8 (ref 5–15)
BUN: 21 mg/dL — ABNORMAL HIGH (ref 6–20)
CO2: 23 mmol/L (ref 22–32)
Calcium: 9.6 mg/dL (ref 8.9–10.3)
Chloride: 101 mmol/L (ref 98–111)
Creatinine, Ser: 1.09 mg/dL — ABNORMAL HIGH (ref 0.44–1.00)
GFR, Estimated: >60 mL/min (ref >60)
Glucose, Bld: 441 mg/dL — ABNORMAL HIGH (ref 70–99)
Potassium: 3.9 mmol/L (ref 3.5–5.1)
Sodium: 132 mmol/L — ABNORMAL LOW (ref 135–145)
Total Bilirubin: 0.5 mg/dL (ref 0.3–1.2)
Total Protein: 7 g/dL (ref 6.5–8.1)

## 2020-11-07 LAB — CBC WITH DIFFERENTIAL/PLATELET
Abs Immature Granulocytes: 0 10*3/uL (ref 0.00–0.07)
Basophils Absolute: 0 10*3/uL (ref 0.0–0.1)
Basophils Relative: 1 %
Eosinophils Absolute: 0.1 10*3/uL (ref 0.0–0.5)
Eosinophils Relative: 3 %
HCT: 37.5 % (ref 36.0–46.0)
Hemoglobin: 12.2 g/dL (ref 12.0–15.0)
Immature Granulocytes: 0 %
Lymphocytes Relative: 52 %
Lymphs Abs: 2.2 10*3/uL (ref 0.7–4.0)
MCH: 28.3 pg (ref 26.0–34.0)
MCHC: 32.5 g/dL (ref 30.0–36.0)
MCV: 87 fL (ref 80.0–100.0)
Monocytes Absolute: 0.3 10*3/uL (ref 0.1–1.0)
Monocytes Relative: 6 %
Neutro Abs: 1.6 10*3/uL — ABNORMAL LOW (ref 1.7–7.7)
Neutrophils Relative %: 38 %
Platelets: 160 10*3/uL (ref 150–400)
RBC: 4.31 MIL/uL (ref 3.87–5.11)
RDW: 11.4 % — ABNORMAL LOW (ref 11.5–15.5)
WBC: 4.3 10*3/uL (ref 4.0–10.5)
nRBC: 0 % (ref 0.0–0.2)

## 2020-11-07 LAB — I-STAT BETA HCG BLOOD, ED (MC, WL, AP ONLY): I-stat hCG, quantitative: <5 m[IU]/mL (ref <5)

## 2020-11-07 LAB — LACTIC ACID, PLASMA
Lactic Acid, Venous: 1.7 mmol/L (ref 0.5–1.9)
Lactic Acid, Venous: 0.9 mmol/L (ref 0.5–1.9)

## 2020-11-07 LAB — APTT: aPTT: 30 seconds (ref 24–36)

## 2020-11-07 MED ORDER — INSULIN ASPART 100 UNIT/ML IJ SOLN
10.0000 [IU] | Freq: Once | INTRAMUSCULAR | Status: AC
  Administered 2020-11-08: 10 [IU] via INTRAVENOUS

## 2020-11-07 MED ORDER — SODIUM CHLORIDE 0.9 % IV BOLUS
1000.0000 mL | Freq: Once | INTRAVENOUS | Status: AC
  Administered 2020-11-07: 1000 mL via INTRAVENOUS

## 2020-11-07 NOTE — ED Provider Notes (Signed)
  Provider Note MRN:  [id number]  Arrival date & time: 11/07/20    ED Course and Medical Decision Making  Assumed care from Dr. Tyrone Nine at shift change.  Question of ataxia and unilateral leg weakness, awaiting MRI.  Favoring neuropathy, if MRI normal plan will be discharge.  MRI normal, appropriate for discharge.  Procedures  Final Clinical Impressions(s) / ED Diagnoses     ICD-10-CM   1. Neuropathy  G62.9       ED Discharge Orders     None       Discharge Instructions   None     Barth Kirks. [place] Small, [place] [email or web address]    Maudie Flakes, MD 11/08/20 [phone]

## 2020-11-07 NOTE — ED Triage Notes (Signed)
Pt here from home, c/o tingling in fingers and pain in bottoms of feet x 3 days. Pt is diabetic, no hx of neuropathy. CBG 412. Ems gave 154ml NS.  22g RAC

## 2020-11-07 NOTE — ED Notes (Signed)
Patient transported to CT 

## 2020-11-07 NOTE — ED Provider Notes (Signed)
[facility] EMERGENCY DEPARTMENT Provider Note   CSN: [id number] Arrival date & time: 11/07/20  1906     History Chief Complaint  Patient presents with   Foot Pain   Hypotension    Jennifer [name] is a 50 y.o. female.  50 yo F with a chief complaints of leg weakness and difficulty ambulating.  Reportedly the patient has had an issue with numbness to her feet this been ongoing for many months but worsening over the past couple days feels like her right leg is more heavy than her left and she has had some difficulties moving it.  Denies back pain denies headaches denies neck pain.  Has been eating and drinking very poorly for some time and family is not sure exactly why.  They deny history of cancer.  Denies difficulty talking or swallowing.  The history is provided by the patient.  Foot Pain Pertinent negatives include no chest pain, no headaches and no shortness of breath.  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  She has not seen a dentist in about one year.  Dentist is on  [address].  No fevers chills or systemic symptoms.  Diabetes has been poorly controlled for some time.  She said it has improved recently averaging around 140.  Called the dentist said they would not see patients until May 14th but the urgency o   Pap smear abnormality of cervix with LGSIL    Routine/ritual circumcision    Type II diabetes mellitus ([place])    Uveitis     Patient Active Problem List   Diagnosis Date Noted   Depression 08/22/2020   Weight loss, non-intentional 07/04/2020   Adult failure to thrive 07/04/2020   Nausea & vomiting 06/28/2020   Early satiety 05/31/2020   UTI (urinary tract infection) 05/16/2020   Elevated liver enzymes 05/15/2020   Closed trimalleolar fracture of right ankle 05/02/2020   Cellulitis (Left 2nd toe) 03/27/2020   Cellulitis of left foot 03/17/2020   AKI (acute kidney injury) ([place]) 03/14/2020   Hypotension 03/10/2020   Right ankle injury 09/28/2019   Bilateral lower extremity edema 06/07/2019   Atrial fibrillation ([place]) 09/20/2018   Migraine    Hematuria 08/24/2018   Vulvovaginitis 05/21/2018   (HFpEF) heart failure with preserved ejection fraction ([place]) 01/01/2018   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   High risk medication use 09/16/2017   Pseudophakia of right eye 09/16/2017   Left anterior shoulder pain 07/06/2017   Hypertension associated with diabetes ([place]) 07/23/2016   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   Fatigue 04/02/2016   Uveitic glaucoma of left eye, 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   Vitamin D deficiency 10/30/2014   Diabetic neuropathy ([place]) 05/13/2013   Iron deficiency anemia 05/13/2013   Hair loss 03/24/2012   Healthcare maintenance 02/26/2011   Hyperlipidemia 05/05/2006   Type II diabetes mellitus ([place]) 04/22/1998    Past Surgical History:  Procedure Laterality Date   CESAREAN SECTION     x 1   EYE SURGERY Bilateral    Lasik   MULTIPLE TOOTH EXTRACTIONS     bottom denture   ORIF ANKLE FRACTURE Right 05/05/2020    Procedure: OPEN REDUCTION INTERNAL FIXATION (ORIF) ANKLE FRACTURE;  Surgeon: Shona Needles, MD;  Location: [place];  Service: Orthopedics;  Laterality: Right;     OB History     Gravida  7   Para  4   Term  4   Preterm  0   AB  3   Living         SAB  3   IAB  0   Ectopic  0   Multiple      Live Births              Family History  Problem Relation Age of Onset   Diabetes Father    Hypertension Other     Social History   Tobacco Use   Smoking status: Never   Smokeless tobacco: Never  Vaping Use   Vaping Use: Never used  Substance Use Topics   Alcohol use: No   Drug use: No    Home Medications Prior to Admission medications   Medication Sig Start Date End Date Taking? Authorizing Provider  Accu-Chek FastClix Lancets MISC Check blood sugar 4 times a day 06/07/19   Jose Persia, MD  ACCU-CHEK GUIDE test strip CHECK BLOOD SUGAR 4 TIMES PER DAY 06/23/20   Mosetta Anis, MD  acetaminophen (TYLENOL) 500 MG tablet Take 500 mg by mouth every 6 (six) hours as needed for mild pain.    [provider]  Blood Glucose Monitoring Suppl (ACCU-CHEK GUIDE) w/Device KIT 1 each by Does not apply route 4 (four) times daily. 11/19/18   Jose Persia, MD  citalopram (CELEXA) 20 MG tablet TAKE 10MG (1 PILL) FOR 7 DAYS, THEN TAKE 2 TABS THEREAFTER. Patient not taking: Reported on 10/02/2020 09/01/20   [name], MD  ferrous sulfate 325 (65 FE) MG tablet Take 1 tablet (325 mg total) by mouth every Monday, Wednesday, and Friday for 14 days. Patient not taking: Reported on 10/02/2020 06/23/20 07/07/20  Mosetta Anis, MD  insulin aspart protamine - aspart (NOVOLOG MIX 70/30 FLEXPEN) (70-30) 100 UNIT/ML FlexPen Inject 0.15-0.3 mLs (15-30 Units total) into the skin 2 (two) times daily. 30 unit morning, 15 unit supper 06/23/20   Mosetta Anis, MD  Insulin Pen Needle (B-D UF III MINI PEN NEEDLES) 31G X 5 MM MISC Use pen needle daily for injections 06/23/20   Mosetta Anis, MD  metFORMIN (GLUCOPHAGE-XR) 500 MG 24 hr tablet Take 1 tablet (500 mg total) by mouth 2 (two) times daily. 08/22/20   [name], MD  metoCLOPramide (REGLAN) 5 MG tablet Take 1 tablet (5 mg total) by mouth 3 (three) times daily before meals. Patient not taking: Reported on 10/02/2020 09/01/20   Jose Persia, MD  apixaban (ELIQUIS) 5 MG TABS tablet Take 1 tablet (5 mg total) by mouth 2 (two) times daily. Patient not taking:  Reported on 08/29/2020 06/23/20 09/27/20  Mosetta Anis, MD  atorvastatin (LIPITOR) 40 MG tablet Take 1 tablet (40 mg total) by mouth daily. Patient not taking: Reported on 08/29/2020 06/23/20 09/27/20  Mosetta Anis, MD  Continuous Blood Gluc Sensor (DEXCOM G6 SENSOR) MISC Use to check blood sugar at least 6 times a day Patient not taking: Reported on 08/29/2020 08/09/20 09/27/20  Harvie Heck, MD  Continuous Blood Gluc Transmit (DEXCOM G6 TRANSMITTER) MISC Use to check blood sugar at least 6 times a day Patient not taking: Reported on 08/29/2020 08/09/20 09/27/20  Harvie Heck, MD  gabapentin (NEURONTIN) 300 MG capsule Take 1 capsule (300 mg total) by mouth at bedtime. Patient not taking: Reported on 08/29/2020 08/09/20 09/27/20  Harvie Heck, MD    Allergies    Patient has no known allergies.  Review of Systems   Review of Systems  Constitutional:  Negative for chills and fever.  HENT:  Negative for congestion and rhinorrhea.   Eyes:  Negative for redness and visual disturbance.  Respiratory:  Negative for shortness of breath and wheezing.   Cardiovascular:  Negative for chest pain and palpitations.  Gastrointestinal:  Negative for nausea and vomiting.  Genitourinary:  Negative for dysuria and urgency.  Musculoskeletal:  Negative for arthralgias and myalgias.  Skin:  Negative for pallor and wound.  Neurological:  Negative for dizziness and headaches.   Physical Exam Updated Vital Signs BP (!) 162/81   Pulse 81   Temp 98.2 F (36.8 C) (Oral)   Resp (!) 22   Ht '5\' 7"'   (1.702 m)   Wt 71 kg   LMP 08/21/2016 (Exact Date)   SpO2 100%   BMI 24.52 kg/m   Physical Exam Vitals and nursing note reviewed.  Constitutional:      General: She is not in acute distress.    Appearance: She is well-developed. She is not diaphoretic.  HENT:     Head: Normocephalic and atraumatic.  Eyes:     Comments: Blind in the left eye, no pupil response, R pupil 84m and reactive  Cardiovascular:     Rate and Rhythm: Normal rate and regular rhythm.     Heart sounds: No murmur heard.   No friction rub. No gallop.  Pulmonary:     Effort: Pulmonary effort is normal.     Breath sounds: No wheezing or rales.  Abdominal:     General: There is no distension.     Palpations: Abdomen is soft.     Tenderness: There is no abdominal tenderness.  Musculoskeletal:        General: No tenderness.     Cervical back: Normal range of motion and neck supple.  Skin:    General: Skin is warm and dry.  Neurological:     Mental Status: She is alert and oriented to person, place, and time.     Sensory: Sensation is intact.     Motor: Weakness present.     Comments: Asymmetric palate elevation.  Very mild right lower extremity weakness compared to left.  Pulse motor and sensation intact in bilateral lower extremities.  No clonus reflexes are 2+ and equal.  Negative Babinski.  Psychiatric:        Behavior: Behavior normal.    ED Results / Procedures / Treatments   Labs (all labs ordered are listed, but only abnormal results are displayed) Labs Reviewed  COMPREHENSIVE METABOLIC PANEL - Abnormal; Notable for the following components:      Result Value  Sodium 132 (*)    Glucose, Bld 441 (*)    BUN 21 (*)    Creatinine, Ser 1.09 (*)    Albumin 3.4 (*)    AST 13 (*)    All other components within normal limits  CBC WITH DIFFERENTIAL/PLATELET - Abnormal; Notable for the following components:   RDW 11.4 (*)    Neutro Abs 1.6 (*)    All other components within normal limits  URINE  CULTURE  CULTURE, BLOOD (ROUTINE X 2)  CULTURE, BLOOD (ROUTINE X 2)  LACTIC ACID, PLASMA  PROTIME-INR  APTT  LACTIC ACID, PLASMA  URINALYSIS, ROUTINE W REFLEX MICROSCOPIC  I-STAT BETA HCG BLOOD, ED ([facility], [facility], [facility] ONLY)    EKG None  Radiology CT Head Wo Contrast  Result Date: 11/07/2020 CLINICAL DATA:  Follow-up stroke generalized weakness fatigue numbness and tingling in fingers per epic chart EXAM: CT HEAD WITHOUT CONTRAST TECHNIQUE: Contiguous axial images were obtained from the base of the skull through the vertex without intravenous contrast. COMPARISON:  CT brain 10/02/2020, 09/26/2020, 03/08/2020 FINDINGS: Brain: No acute territorial infarction, hemorrhage, or intracranial mass. Mild atrophy and minimal chronic small vessel ischemic changes of the white matter. Stable ventricle size. Vascular: No hyperdense vessel.  No unexpected calcification Skull: Normal. Negative for fracture or focal lesion. Sinuses/Orbits: No acute finding. Other: None IMPRESSION: 1. No CT evidence for acute intracranial abnormality. 2. Mild atrophy and minimal chronic small vessel ischemic change of the white matter Electronically Signed   By: Donavan Foil M.D.   On: 11/07/2020 21:49   DG Chest Port 1 View  Result Date: 11/07/2020 CLINICAL DATA:  Questionable sepsis - evaluate for abnormality Patient reports tingling in fingers and pain in bottom of feet for 3 days. EXAM: PORTABLE CHEST 1 VIEW COMPARISON:  10/02/2020 FINDINGS: Lung volumes are low.The cardiomediastinal contours are normal. The lungs are clear. Pulmonary vasculature is normal. No consolidation, pleural effusion, or pneumothorax. No acute osseous abnormalities are seen. IMPRESSION: Low lung volumes without acute abnormality. Electronically Signed   By: Keith Rake M.D.   On: 11/07/2020 19:48    Procedures Procedures   Medications Ordered in ED Medications  insulin aspart (novoLOG) injection 10 Units (has no administration in time range)   sodium chloride 0.9 % bolus 1,000 mL (1,000 mLs Intravenous New Bag/Given 11/07/20 2134)    ED Course  I have reviewed the triage vital signs and the nursing notes.  Pertinent labs & imaging results that were available during my care of the patient were reviewed by me and considered in my medical decision making (see chart for details).    MDM Rules/Calculators/A&P                           50 yo F with a chief complaints of difficulty ambulating and a decrease sensation to her feet worse on the right than the left.  Patient's symptoms were most consistent with a peripheral neuropathy though on exam I find that she is weak on the right side compared to the left and she also had an asymmetric palate elevation.  We will obtain a CT of the head and MRI.  Blood work.  She was also noted to be very hypertensive on arrival.  Resolved without any intervention.  Family states that this is been an off-and-on problem and usually is positional.  We will give a bolus of IV fluids for possible dehydration.  Awaiting MRI.   Signed out to  Dr. Sedonia Small, please see his note for further details of care in the ED.  The patients results and plan were reviewed and discussed.   Any x-rays performed were independently reviewed by myself.   Differential diagnosis were considered with the presenting HPI.  Medications  insulin aspart (novoLOG) injection 10 Units (has no administration in time range)  sodium chloride 0.9 % bolus 1,000 mL (1,000 mLs Intravenous New Bag/Given 11/07/20 2134)    Vitals:   11/07/20 1916 11/07/20 2015 11/07/20 2030 11/07/20 2200  BP: (!) 73/53 (!) 160/85 (!) 160/79 (!) 162/81  Pulse: 81 77 78 81  Resp: '18 16 12 ' (!) 22  Temp: 98.2 F (36.8 C)     TempSrc: Oral     SpO2: 100% 100% 100% 100%  Weight:      Height:        Final diagnoses:  Neuropathy    Admission/ observation were discussed with the admitting physician, patient and/or family and they are comfortable with the  plan.   Final Clinical Impression(s) / ED Diagnoses Final diagnoses:  Neuropathy    Rx / DC Orders ED Discharge Orders     None        Deno Etienne, DO 11/07/20 2305

## 2020-11-07 NOTE — ED Provider Notes (Signed)
Emergency Medicine Provider Triage Evaluation Note  Jennifer [name] , a 51 y.o. female  was evaluated in triage.  Pt complains of generalized weakness, fatigue, numbness and tingling in her fingers and bottoms of her feet x3 days.  Patient is diabetic without history of neuropathy.  Denies any chest pain, shortness of breath or palpitation.  Translation provided by patient's family at the bedside.  Review of Systems  Positive: Weakness, fatigue, numbness and tingling in the hands and feet Negative: Nausea, vomiting, diarrhea, dysuria, urinary frequency or urgency, chest pain palpitations, shortness of breath  Physical Exam  BP (!) 73/53 (BP Location: Left Arm)   Pulse 81   Temp 98.2 F (36.8 C) (Oral)   Resp 18   Ht 5\' 7"  (1.702 m)   Wt 71 kg   LMP 08/21/2016 (Exact Date)   SpO2 100%   BMI 24.52 kg/m  Gen:   Awake, no distress   Resp:  Normal effort  MSK:   Moves extremities without difficulty  Other:  RRR no M/R/D.  Lungs CTA B.  Medical Decision Making  Medically screening exam initiated at 7:23 PM.  Appropriate orders placed.  Jennifer [name] was informed that the remainder of the evaluation will be completed by another provider, this initial triage assessment does not replace that evaluation, and the importance of remaining in the ED until their evaluation is complete.  Patient's BP initially quite low, rechecked and again 73/53.  Charge RN informed, searching for a room for this patient.  Concern for possible sepsis, will proceed with sepsis order set.  No clear source for infection at this time.  This chart was dictated using voice recognition software, Dragon. Despite the best efforts of this provider to proofread and correct errors, errors may still occur which can change documentation meaning.    Jennifer [name] 11/07/20 1924    Tegeler, Gwenyth Allegra, MD 11/07/20 2047

## 2020-11-08 LAB — URINALYSIS, ROUTINE W REFLEX MICROSCOPIC
Bacteria, UA: NONE SEEN
Bilirubin Urine: NEGATIVE
Glucose, UA: >=500 mg/dL — AB
Ketones, ur: NEGATIVE mg/dL
Nitrite: NEGATIVE
Protein, ur: 30 mg/dL — AB
Specific Gravity, Urine: 1.02 (ref 1.005–1.030)
pH: 6 (ref 5.0–8.0)

## 2020-11-08 NOTE — Discharge Instructions (Addendum)
You were evaluated in the Emergency Department and after careful evaluation, we did not find any emergent condition requiring admission or further testing in the hospital.  Your exam/testing today is overall reassuring.  Your MRI was normal, no evidence of stroke.  Recommend follow-up with your regular doctor to discuss your symptoms.  Please return to the Emergency Department if you experience any worsening of your condition.   Thank you for allowing [place] to be a part of your care.

## 2020-11-09 LAB — URINE CULTURE: Culture: >=100000 — AB

## 2020-11-12 LAB — CULTURE, BLOOD (ROUTINE X 2)
Culture: NO GROWTH
Culture: NO GROWTH
Special Requests: ADEQUATE

## 2020-11-15 ENCOUNTER — Ambulatory Visit: Payer: Medicaid Other

## 2020-11-20 ENCOUNTER — Other Ambulatory Visit: Payer: Self-pay

## 2020-11-20 NOTE — Patient Outreach (Signed)
[place] [facility]) Care Management  [facility] Social Work  11/20/2020  Jennifer [name] 08-18-1970 [id number]  Encounter Medications:  Outpatient Encounter Medications as of 11/20/2020  Medication Sig   Accu-Chek FastClix Lancets MISC Check blood sugar 4 times a day   ACCU-CHEK GUIDE test strip CHECK BLOOD SUGAR 4 TIMES PER DAY   acetaminophen (TYLENOL) 500 MG tablet Take 500 mg by mouth every 6 (six) hours as needed for mild pain.   Blood Glucose Monitoring Suppl (ACCU-CHEK GUIDE) w/Device KIT 1 each by Does not apply route 4 (four) times daily.   citalopram (CELEXA) 20 MG tablet TAKE 10MG (1 PILL) FOR 7 DAYS, THEN TAKE 2 TABS THEREAFTER. (Patient not taking: Reported on 10/02/2020)   ferrous sulfate 325 (65 FE) MG tablet Take 1 tablet (325 mg total) by mouth every Monday, Wednesday, and Friday for 14 days. (Patient not taking: Reported on 10/02/2020)   insulin aspart protamine - aspart (NOVOLOG MIX 70/30 FLEXPEN) (70-30) 100 UNIT/ML FlexPen Inject 0.15-0.3 mLs (15-30 Units total) into the skin 2 (two) times daily. 30 unit morning, 15 unit supper   Insulin Pen Needle (B-D UF III MINI PEN NEEDLES) 31G X 5 MM MISC Use pen needle daily for injections   metFORMIN (GLUCOPHAGE-XR) 500 MG 24 hr tablet Take 1 tablet (500 mg total) by mouth 2 (two) times daily.   metoCLOPramide (REGLAN) 5 MG tablet Take 1 tablet (5 mg total) by mouth 3 (three) times daily before meals. (Patient not taking: Reported on 10/02/2020)   [DISCONTINUED] apixaban (ELIQUIS) 5 MG TABS tablet Take 1 tablet (5 mg total) by mouth 2 (two) times daily. (Patient not taking: Reported on 08/29/2020)   [DISCONTINUED] atorvastatin (LIPITOR) 40 MG tablet Take 1 tablet (40 mg total) by mouth daily. (Patient not taking: Reported on 08/29/2020)   [DISCONTINUED] Continuous Blood Gluc Sensor (DEXCOM G6 SENSOR) MISC Use to check blood sugar at least 6 times a day (Patient not taking: Reported on 08/29/2020)   [DISCONTINUED] Continuous Blood Gluc  Transmit (DEXCOM G6 TRANSMITTER) MISC Use to check blood sugar at least 6 times a day (Patient not taking: Reported on 08/29/2020)   [DISCONTINUED] gabapentin (NEURONTIN) 300 MG capsule Take 1 capsule (300 mg total) by mouth at bedtime. (Patient not taking: Reported on 08/29/2020)   No facility-administered encounter medications on file as of 11/20/2020.    Functional Status:  In your present state of health, do you have any difficulty performing the following activities: 09/01/2020 08/22/2020  Hearing? N N  Vision? Y Y  Difficulty concentrating or making decisions? Y N  Comment AT TIMES -  Walking or climbing stairs? Y Y  Comment - blind  Dressing or bathing? Y N  Comment - -  Doing errands, shopping? N Y  Comment - blind  Some recent data might be hidden    Fall/Depression Screening:  PHQ 2/9 Scores 09/01/2020 08/09/2020 07/03/2020 04/27/2020 03/27/2020 03/10/2020 02/08/2020  PHQ - 2 Score 2 0 0 0 - 6 2  PHQ- 9 Score 13 0 0 - - 25 3  Exception Documentation - - - - Other- indicate reason in comment box - -    Assessment:  Care Plan There are no care plans that you recently modified to display for this patient.    Goals Addressed   None    LCSW completed additional [facility] outreach attempt today but was unable to reach patient successfully. A HIPPA compliant voice message was unable to be left. LCSW has made over 3 unsuccessful outreach attempts  and will close referral at this time but is happy to get back involved if patient wishes for social work support.  Eula Fried, BSW, MSW, [employer] Managed Medicaid LCSW [email or web address] Phone: [phone]

## 2020-11-20 NOTE — Patient Instructions (Signed)
[name] ,   The [facility] Managed Care Team is available to provide assistance to you with your healthcare needs at no cost and as a benefit of your [facility] Health plan. We have been unable to reach you on 3 separate attempts. The care management team is available to assist with your healthcare needs at any time. Please do not hesitate to contact me at the number below. .   Thank you,   Eula Fried, BSW, MSW, LCSW Managed Medicaid LCSW [email or web address] Phone: [phone]

## 2020-11-21 ENCOUNTER — Ambulatory Visit: Payer: Medicaid Other

## 2020-11-21 ENCOUNTER — Other Ambulatory Visit: Payer: Self-pay | Admitting: Licensed Clinical Social Worker

## 2020-11-21 NOTE — Patient Instructions (Signed)
Visit Information  Jennifer [name] was given information about Medicaid Managed Care team care coordination services as a part of their [place] Medicaid benefit. Jennifer [name] verbally consented to engagement with the [facility] Managed Care team.   If you are experiencing a medical emergency, please call 911 or report to your local emergency department or urgent care.   If you have a non-emergency medical problem during routine business hours, please contact your provider's office and ask to speak with a nurse.   For questions related to your [facility], please call: [phone] or visit the homepage here: [email or web address]  If you would like to schedule transportation through your [facility], please call the following number at least 2 days in advance of your appointment: [phone].   Call the [facility] at [phone], at any time, 24 hours a day, 7 days a week. If you are in danger or need immediate medical attention call 911.  If you would like help to quit smoking, call 1-800-QUIT-NOW 825-462-6341) OR Espaol: 1-855-Djelo-Ya QO:409462) o para ms informacin haga clic aqu or Text READY to 200-400 to register via text  Jennifer [name] - following are the goals we discussed in your visit today:   Goals Addressed             This Visit's Progress    Manage My Emotions       Timeframe:  Long-Range Goal Priority:  High Start Date:    09/01/20                         Expected End Date:  ongoing                   Follow Up Date- 11/27/20   - begin personal counseling - call and visit an old friend - check out volunteer opportunities - join a support group - laugh; watch a funny movie or comedian - learn and use visualization or guided imagery - perform a random act of kindness - practice relaxation or meditation daily -  start or continue a personal journal - talk about feelings with a friend, family or spiritual advisor - practice positive thinking and self-talk    Why is this important?   When you are stressed, down or upset, your body reacts too.  For example, your blood pressure may get higher; you may have a headache or stomachache.  When your emotions get the best of you, your body's ability to fight off cold and flu gets weak.  These steps will help you manage your emotions.     Notes:        Jennifer [name], BSW, MSW, [employer] Managed Medicaid [email or web address] [email or web address] Phone: [phone]

## 2020-11-21 NOTE — Patient Outreach (Signed)
Medicaid Managed Care Social Work Note  11/21/2020 Name:  Jennifer [name] MRN:  [id number] DOB:  [birth year]  Jennifer [name] is an 50 y.o. year old female who is a primary patient of Jose Persia, MD.  The Medicaid Managed Care Coordination team was consulted for assistance with:  [place] and Resources  Jennifer [name] was given information about Medicaid Managed CareCoordination services today. [name] agreed to services and verbal consent obtained.  Engaged with patient  for by telephone forfollow up visit in response to referral for case management and/or care coordination services.   Assessments/Interventions:  Review of past medical history, allergies, medications, health status, including review of consultants reports, laboratory and other test data, was performed as part of comprehensive evaluation and provision of chronic care management services.  SDOH: (Social Determinant of Health) assessments and interventions performed: SDOH Interventions    Flowsheet Row Most Recent Value  SDOH Interventions   Depression Interventions/Treatment  Referral to Psychiatry       Advanced Directives Status:  Not addressed in this encounter.  Care Plan                 No Known Allergies  Medications Reviewed Today     Reviewed by Victoriano Lain, CPhT (Pharmacy Technician) on 10/02/20 at 2156  Med List Status: Complete   Medication Order Taking? Sig Documenting Provider Last Dose Status Informant  Accu-Chek FastClix Lancets MISC [id number]  Check blood sugar 4 times a day Jose Persia, MD  Active Child  ACCU-CHEK GUIDE test strip [id number]  CHECK BLOOD SUGAR 4 TIMES PER DAY Mosetta Anis, MD  Active Child  acetaminophen (TYLENOL) 500 MG tablet [id number] Yes Take 500 mg by mouth every 6 (six) hours as needed for mild pain. [provider] Past Month Active Child  Blood Glucose Monitoring Suppl (ACCU-CHEK GUIDE) w/Device KIT [id number]  1 each by Does not  apply route 4 (four) times daily. Jose Persia, MD  Active Child  citalopram (CELEXA) 20 MG tablet [id number] No TAKE 10MG (1 PILL) FOR 7 DAYS, THEN TAKE 2 TABS THEREAFTER.  Patient not taking: Reported on 10/02/2020   [name], MD Not Taking Consider Medication Status and Discontinue (Discontinued by provider) Child  ferrous sulfate 325 (65 FE) MG tablet [id number] No Take 1 tablet (325 mg total) by mouth every Monday, Wednesday, and Friday for 14 days.  Patient not taking: Reported on 10/02/2020   Mosetta Anis, MD Not Taking Expired 07/07/20 2359   insulin aspart protamine - aspart (NOVOLOG MIX 70/30 FLEXPEN) (70-30) 100 UNIT/ML FlexPen [id number] Yes Inject 0.15-0.3 mLs (15-30 Units total) into the skin 2 (two) times daily. 30 unit morning, 15 unit supper Mosetta Anis, MD 10/02/2020 Active Child  Insulin Pen Needle (B-D UF III MINI PEN NEEDLES) 31G X 5 MM MISC [id number]  Use pen needle daily for injections Mosetta Anis, MD  Active Child  metFORMIN (GLUCOPHAGE-XR) 500 MG 24 hr tablet [id number] Yes Take 1 tablet (500 mg total) by mouth 2 (two) times daily. [name], MD 10/01/2020 Active Child  metoCLOPramide (REGLAN) 5 MG tablet [id number] No Take 1 tablet (5 mg total) by mouth 3 (three) times daily before meals.  Patient not taking: Reported on 10/02/2020   Jose Persia, MD Not Taking Consider Medication Status and Discontinue (Completed Course) Child  Med List Note Victoriano Lain, CPhT 10/02/20 2155): Daughter can help            Patient Active Problem List  Diagnosis Date Noted   Depression 08/22/2020   Weight loss, non-intentional 07/04/2020   Adult failure to thrive 07/04/2020   Nausea & vomiting 06/28/2020   Early satiety 05/31/2020   UTI (urinary tract infection) 05/16/2020   Elevated liver enzymes 05/15/2020   Closed trimalleolar fracture of right ankle 05/02/2020   Cellulitis (Left 2nd toe) 03/27/2020   Cellulitis of left foot 03/17/2020   AKI  (acute kidney injury) ([place]) 03/14/2020   Hypotension 03/10/2020   Right ankle injury 09/28/2019   Bilateral lower extremity edema 06/07/2019   Atrial fibrillation ([place]) 09/20/2018   Migraine    Hematuria 08/24/2018   Vulvovaginitis 05/21/2018   (HFpEF) heart failure with preserved ejection fraction ([place]) 01/01/2018   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   High risk medication use 09/16/2017   Pseudophakia of right eye 09/16/2017   Left anterior shoulder pain 07/06/2017   Hypertension associated with diabetes ([place]) 07/23/2016   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   Fatigue 04/02/2016   Uveitic glaucoma of left eye, 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   Vitamin D deficiency 10/30/2014   Diabetic neuropathy ([place]) 05/13/2013   Iron deficiency anemia 05/13/2013   Hair loss 03/24/2012   Healthcare maintenance 02/26/2011   Hyperlipidemia 05/05/2006   Type II diabetes mellitus ([place]) 04/22/1998    Conditions to be addressed/monitored per PCP order:  Anxiety and Depression  Care Plan : LCSW Plan of Care  Updates made by Greg Cutter, LCSW since 11/21/2020 12:00 AM     Problem: Depression Identification (Depression)      Long-Range Goal: Depressive Symptoms Identified   Start Date: 09/01/2020  Expected End Date: 11/01/2020  Priority: High  Note:   Timeframe:  Long-Range Goal Priority:  High Start Date:    09/01/20                         Expected End Date:  ongoing                   Follow Up Date- 11/27/20  Current barriers:   Chronic Mental Health needs related to depression Limited social support, Mental Health Concerns , and language barrier as patient speaks Arabic Needs Support, Education, and Care Coordination in order to meet unmet mental health needs.  Clinical Goal(s): Over the next 120 days, patient will work with SW to reduce or manage symptoms of anxiety, depression, and stress and increase knowledge and/or ability of: coping skills, healthy habits, self-management skills, and  stress reduction.until connected for ongoing counseling.  Clinical Interventions:  Assessed patient's previous treatment, needs, coping skills, current treatment, support system and barriers to care  [facility] LCSW contact patient's main number through an Arabic interpreter but patient reports that she prefers that all communication and calls go through her daughter who is a great advocate for her and can speak [place].  Patient reports recent stress because her grandson is in the ICU and she was notified that a close friend of hers has cancer.  Other interventions: Depression screen reviewed , Solution-Focused Strategies, Behavioral Activation, Motivational Interviewing, Brief CBT , Provided basic mental health support, education  , and Suicidal Ideation/Homicidal Ideation assessed: ; Patient interviewed and appropriate assessments performed Discussed plans with patient for ongoing care management follow up and provided patient with direct contact information for care management team Assisted patient/caregiver with obtaining information about health plan benefits Provided education and assistance to client regarding Advanced Directives. Referred patient to [facility] (mental  health provider) for long term follow up and therapy/counseling Discussed several options for long term counseling based on need and insurance.  Assisted patient with narrowing the options down to [facility] ) Referral placed on on 09/01/20 for therapy and psychiatry. However, only counseling appointment was scheduled for 11/02/20. Daughter reports that patient wants to wait for psychiatry appointment once she completes her initial therapy appointment. [facility] LCSW sent secure message to [place] asking if counseling appointment is virtual or in person. Family is asking for community resources for menta health support on 11/21/20. [facility] LCSW sent a text message with all mental health  resources available in [place], [place] to patient.  Inter-disciplinary care team collaboration (see longitudinal plan of care)  Patient Goals/Self-Care Activities: Over the next 120 days - avoid negative self-talk - develop a personal safety plan - develop a plan to deal with triggers like holidays, anniversaries - exercise at least 2 to 3 times per week - have a plan for how to handle bad days - journal feelings and what helps to feel better or worse - spend time or talk with others at least 2 to 3 times per week - spend time or talk with others every day - watch for early signs of feeling worse - write in journal every day - begin personal counseling - call and visit an old friend - check out volunteer opportunities - join a support group - laugh; watch a funny movie or comedian - learn and use visualization or guided imagery - perform a random act of kindness - practice relaxation or meditation daily - start or continue a personal journal - talk about feelings with a friend, family or spiritual advisor - practice positive thinking and self-talk Call [facility] if you want to schedule a psychiatry appointment          Follow up:  Patient agrees to Care Plan and Follow-up.  Plan: The Managed Medicaid care management team will reach out to the patient again over the next 7 days.  Date of next scheduled Social Work care management/care coordination outreach:  11/27/20  Eula Fried, BSW, MSW, LCSW Managed Medicaid LCSW [email or web address] Phone: [phone]

## 2020-11-22 ENCOUNTER — Ambulatory Visit (INDEPENDENT_AMBULATORY_CARE_PROVIDER_SITE_OTHER): Payer: Medicaid Other | Admitting: Internal Medicine

## 2020-11-22 ENCOUNTER — Other Ambulatory Visit: Payer: Self-pay

## 2020-11-22 ENCOUNTER — Inpatient Hospital Stay (HOSPITAL_COMMUNITY)
Admission: AD | Admit: 2020-11-22 | Discharge: 2020-11-29 | DRG: 074 | Disposition: A | Discharge status: Skilled Nursing Facility | Payer: Medicaid Other | Source: Ambulatory Visit | Attending: Student in an Organized Health Care Education/Training Program | Admitting: Student in an Organized Health Care Education/Training Program

## 2020-11-22 ENCOUNTER — Encounter: Payer: Self-pay | Admitting: Internal Medicine

## 2020-11-22 VITALS — BP 121/71 | HR 86 | Temp 98.4°F | Resp 28 | Ht 67.0 in | Wt 152.7 lb

## 2020-11-22 DIAGNOSIS — R627 Adult failure to thrive: Secondary | ICD-10-CM

## 2020-11-22 DIAGNOSIS — Z8616 Personal history of COVID-19: Secondary | ICD-10-CM

## 2020-11-22 DIAGNOSIS — I951 Orthostatic hypotension: Secondary | ICD-10-CM

## 2020-11-22 DIAGNOSIS — I480 Paroxysmal atrial fibrillation: Secondary | ICD-10-CM | POA: Diagnosis present

## 2020-11-22 DIAGNOSIS — E11319 Type 2 diabetes mellitus with unspecified diabetic retinopathy without macular edema: Secondary | ICD-10-CM

## 2020-11-22 DIAGNOSIS — E1122 Type 2 diabetes mellitus with diabetic chronic kidney disease: Secondary | ICD-10-CM | POA: Diagnosis present

## 2020-11-22 DIAGNOSIS — R54 Age-related physical debility: Secondary | ICD-10-CM | POA: Diagnosis present

## 2020-11-22 DIAGNOSIS — R29898 Other symptoms and signs involving the musculoskeletal system: Secondary | ICD-10-CM

## 2020-11-22 DIAGNOSIS — I5030 Unspecified diastolic (congestive) heart failure: Secondary | ICD-10-CM | POA: Diagnosis present

## 2020-11-22 DIAGNOSIS — E1165 Type 2 diabetes mellitus with hyperglycemia: Secondary | ICD-10-CM | POA: Diagnosis present

## 2020-11-22 DIAGNOSIS — E1143 Type 2 diabetes mellitus with diabetic autonomic (poly)neuropathy: Secondary | ICD-10-CM | POA: Diagnosis present

## 2020-11-22 DIAGNOSIS — Z794 Long term (current) use of insulin: Secondary | ICD-10-CM | POA: Diagnosis not present

## 2020-11-22 DIAGNOSIS — F32A Depression, unspecified: Secondary | ICD-10-CM

## 2020-11-22 DIAGNOSIS — I959 Hypotension, unspecified: Secondary | ICD-10-CM | POA: Diagnosis present

## 2020-11-22 DIAGNOSIS — R6881 Early satiety: Secondary | ICD-10-CM | POA: Diagnosis present

## 2020-11-22 DIAGNOSIS — N183 Chronic kidney disease, stage 3 unspecified: Secondary | ICD-10-CM

## 2020-11-22 DIAGNOSIS — R112 Nausea with vomiting, unspecified: Secondary | ICD-10-CM | POA: Diagnosis not present

## 2020-11-22 DIAGNOSIS — I4891 Unspecified atrial fibrillation: Secondary | ICD-10-CM | POA: Diagnosis not present

## 2020-11-22 DIAGNOSIS — N1831 Chronic kidney disease, stage 3a: Secondary | ICD-10-CM | POA: Diagnosis present

## 2020-11-22 DIAGNOSIS — Z833 Family history of diabetes mellitus: Secondary | ICD-10-CM

## 2020-11-22 DIAGNOSIS — I5032 Chronic diastolic (congestive) heart failure: Secondary | ICD-10-CM | POA: Diagnosis present

## 2020-11-22 DIAGNOSIS — R140 Abdominal distension (gaseous): Secondary | ICD-10-CM | POA: Diagnosis present

## 2020-11-22 DIAGNOSIS — Z7901 Long term (current) use of anticoagulants: Secondary | ICD-10-CM

## 2020-11-22 DIAGNOSIS — Z7984 Long term (current) use of oral hypoglycemic drugs: Secondary | ICD-10-CM

## 2020-11-22 DIAGNOSIS — H5462 Unqualified visual loss, left eye, normal vision right eye: Secondary | ICD-10-CM | POA: Diagnosis present

## 2020-11-22 DIAGNOSIS — K3184 Gastroparesis: Secondary | ICD-10-CM | POA: Diagnosis present

## 2020-11-22 DIAGNOSIS — D509 Iron deficiency anemia, unspecified: Secondary | ICD-10-CM | POA: Diagnosis present

## 2020-11-22 DIAGNOSIS — E119 Type 2 diabetes mellitus without complications: Secondary | ICD-10-CM

## 2020-11-22 DIAGNOSIS — E871 Hypo-osmolality and hyponatremia: Secondary | ICD-10-CM | POA: Diagnosis present

## 2020-11-22 DIAGNOSIS — E1100 Type 2 diabetes mellitus with hyperosmolarity without nonketotic hyperglycemic-hyperosmolar coma (NKHHC): Secondary | ICD-10-CM

## 2020-11-22 DIAGNOSIS — E860 Dehydration: Secondary | ICD-10-CM | POA: Diagnosis present

## 2020-11-22 DIAGNOSIS — E1142 Type 2 diabetes mellitus with diabetic polyneuropathy: Principal | ICD-10-CM | POA: Diagnosis present

## 2020-11-22 DIAGNOSIS — I130 Hypertensive heart and chronic kidney disease with heart failure and stage 1 through stage 4 chronic kidney disease, or unspecified chronic kidney disease: Secondary | ICD-10-CM | POA: Diagnosis present

## 2020-11-22 DIAGNOSIS — E861 Hypovolemia: Secondary | ICD-10-CM | POA: Diagnosis present

## 2020-11-22 DIAGNOSIS — R531 Weakness: Secondary | ICD-10-CM | POA: Diagnosis not present

## 2020-11-22 DIAGNOSIS — E785 Hyperlipidemia, unspecified: Secondary | ICD-10-CM | POA: Diagnosis present

## 2020-11-22 DIAGNOSIS — E11649 Type 2 diabetes mellitus with hypoglycemia without coma: Secondary | ICD-10-CM | POA: Diagnosis present

## 2020-11-22 LAB — CBC
HCT: 37.7 % (ref 36.0–46.0)
Hemoglobin: 12.2 g/dL (ref 12.0–15.0)
MCH: 28.2 pg (ref 26.0–34.0)
MCHC: 32.4 g/dL (ref 30.0–36.0)
MCV: 87.1 fL (ref 80.0–100.0)
Platelets: 180 10*3/uL (ref 150–400)
RBC: 4.33 MIL/uL (ref 3.87–5.11)
RDW: 11.6 % (ref 11.5–15.5)
WBC: 5.5 10*3/uL (ref 4.0–10.5)
nRBC: 0 % (ref 0.0–0.2)

## 2020-11-22 LAB — POCT GLYCOSYLATED HEMOGLOBIN (HGB A1C): Hemoglobin A1C: 13.5 % — AB (ref 4.0–5.6)

## 2020-11-22 LAB — COMPREHENSIVE METABOLIC PANEL
ALT: 12 U/L (ref 0–44)
AST: 18 U/L (ref 15–41)
Albumin: 3.1 g/dL — ABNORMAL LOW (ref 3.5–5.0)
Alkaline Phosphatase: 62 U/L (ref 38–126)
Anion gap: 9 (ref 5–15)
BUN: 26 mg/dL — ABNORMAL HIGH (ref 6–20)
CO2: 23 mmol/L (ref 22–32)
Calcium: 9.3 mg/dL (ref 8.9–10.3)
Chloride: 101 mmol/L (ref 98–111)
Creatinine, Ser: 1 mg/dL (ref 0.44–1.00)
GFR, Estimated: >60 mL/min (ref >60)
Glucose, Bld: 69 mg/dL — ABNORMAL LOW (ref 70–99)
Potassium: 3.6 mmol/L (ref 3.5–5.1)
Sodium: 133 mmol/L — ABNORMAL LOW (ref 135–145)
Total Bilirubin: 0.6 mg/dL (ref 0.3–1.2)
Total Protein: 6.4 g/dL — ABNORMAL LOW (ref 6.5–8.1)

## 2020-11-22 LAB — BASIC METABOLIC PANEL
Anion gap: 8 (ref 5–15)
BUN: 27 mg/dL — ABNORMAL HIGH (ref 6–20)
CO2: 25 mmol/L (ref 22–32)
Calcium: 9.6 mg/dL (ref 8.9–10.3)
Chloride: 101 mmol/L (ref 98–111)
Creatinine, Ser: 1.18 mg/dL — ABNORMAL HIGH (ref 0.44–1.00)
GFR, Estimated: 57 mL/min — ABNORMAL LOW (ref >60)
Glucose, Bld: 294 mg/dL — ABNORMAL HIGH (ref 70–99)
Potassium: 4 mmol/L (ref 3.5–5.1)
Sodium: 134 mmol/L — ABNORMAL LOW (ref 135–145)

## 2020-11-22 LAB — GLUCOSE, CAPILLARY
Glucose-Capillary: 411 mg/dL — ABNORMAL HIGH (ref 70–99)
Glucose-Capillary: 107 mg/dL — ABNORMAL HIGH (ref 70–99)
Glucose-Capillary: 59 mg/dL — ABNORMAL LOW (ref 70–99)
Glucose-Capillary: 93 mg/dL (ref 70–99)

## 2020-11-22 LAB — VITAMIN B12: Vitamin B-12: 254 pg/mL (ref 180–914)

## 2020-11-22 MED ORDER — INSULIN DETEMIR 100 UNIT/ML ~~LOC~~ SOLN
10.0000 [IU] | Freq: Every day | SUBCUTANEOUS | Status: DC
  Filled 2020-11-22: qty 0.1

## 2020-11-22 MED ORDER — INSULIN ASPART PROT & ASPART (70-30 MIX) 100 UNIT/ML PEN: 15.0000 [IU] | PEN_INJECTOR | Freq: Every day | SUBCUTANEOUS | Status: DC

## 2020-11-22 MED ORDER — LINAGLIPTIN 5 MG PO TABS
5.0000 mg | ORAL_TABLET | Freq: Every day | ORAL | Status: DC
  Administered 2020-11-23 – 2020-11-29 (×7): 5 mg via ORAL
  Filled 2020-11-22 (×7): qty 1

## 2020-11-22 MED ORDER — LACTATED RINGERS IV BOLUS: 500.0000 mL | Freq: Once | INTRAVENOUS | Status: DC

## 2020-11-22 MED ORDER — SITAGLIPTIN PHOS-METFORMIN HCL 50-1000 MG PO TABS: 1.0000 | ORAL_TABLET | Freq: Two times a day (BID) | ORAL | Status: DC

## 2020-11-22 MED ORDER — LACTATED RINGERS IV BOLUS
1000.0000 mL | Freq: Once | INTRAVENOUS | Status: AC
  Administered 2020-11-22: 1000 mL via INTRAVENOUS

## 2020-11-22 MED ORDER — ACETAMINOPHEN 325 MG PO TABS
650.0000 mg | ORAL_TABLET | Freq: Four times a day (QID) | ORAL | Status: DC | PRN
  Administered 2020-11-22 – 2020-11-28 (×2): 650 mg via ORAL
  Filled 2020-11-22 (×2): qty 2

## 2020-11-22 MED ORDER — JANUMET 50-1000 MG PO TABS: 1.0000 | ORAL_TABLET | Freq: Two times a day (BID) | ORAL | 0 refills | Status: DC

## 2020-11-22 MED ORDER — SODIUM CHLORIDE 0.9 % IV BOLUS
1000.0000 mL | Freq: Once | INTRAVENOUS | Status: AC
  Administered 2020-11-22: 1000 mL via INTRAVENOUS

## 2020-11-22 MED ORDER — METOCLOPRAMIDE HCL 5 MG PO TABS: 5.0000 mg | ORAL_TABLET | Freq: Three times a day (TID) | ORAL | Status: DC

## 2020-11-22 MED ORDER — LACTATED RINGERS IV BOLUS: 1000.0000 mL | Freq: Once | INTRAVENOUS | Status: DC

## 2020-11-22 MED ORDER — METFORMIN HCL 500 MG PO TABS
1000.0000 mg | ORAL_TABLET | Freq: Two times a day (BID) | ORAL | Status: DC
  Administered 2020-11-23 – 2020-11-29 (×14): 1000 mg via ORAL
  Filled 2020-11-22 (×14): qty 2

## 2020-11-22 MED ORDER — ACETAMINOPHEN 650 MG RE SUPP: 650.0000 mg | Freq: Four times a day (QID) | RECTAL | Status: DC | PRN

## 2020-11-22 MED ORDER — INSULIN ASPART 100 UNIT/ML IJ SOLN: 4.0000 [IU] | Freq: Three times a day (TID) | INTRAMUSCULAR | Status: DC

## 2020-11-22 MED ORDER — ENOXAPARIN SODIUM 40 MG/0.4ML IJ SOSY
40.0000 mg | PREFILLED_SYRINGE | INTRAMUSCULAR | Status: DC
  Administered 2020-11-22: 40 mg via SUBCUTANEOUS
  Filled 2020-11-22: qty 0.4

## 2020-11-22 MED ORDER — INSULIN ASPART 100 UNIT/ML IJ SOLN
0.0000 [IU] | Freq: Three times a day (TID) | INTRAMUSCULAR | Status: DC
  Administered 2020-11-23: 5 [IU] via SUBCUTANEOUS
  Administered 2020-11-23 – 2020-11-24 (×3): 3 [IU] via SUBCUTANEOUS
  Administered 2020-11-24: 8 [IU] via SUBCUTANEOUS
  Administered 2020-11-24 – 2020-11-25 (×3): 3 [IU] via SUBCUTANEOUS
  Administered 2020-11-26: 2 [IU] via SUBCUTANEOUS
  Administered 2020-11-26: 3 [IU] via SUBCUTANEOUS
  Administered 2020-11-28 (×2): 5 [IU] via SUBCUTANEOUS
  Administered 2020-11-29: 2 [IU] via SUBCUTANEOUS
  Administered 2020-11-29: 5 [IU] via SUBCUTANEOUS

## 2020-11-22 NOTE — Assessment & Plan Note (Signed)
Endorsing feelings of sadness, changes in interest and loss of energy - patient has been taking Celexa in the past, will restart

## 2020-11-22 NOTE — Progress Notes (Addendum)
Date and time results received: 11/22/20 2200  Test: CBG Critical Value: '59mg'$ /dL  Name of Provider Notified: Dean,MD  Orders Received? Yes Or Actions Taken?: Pt given apple juice, CBG will be checked in 42mns.  Pt denies dizziness,lethargy. Pt stated she feels fine.  Pt CBG '93mg'$ /dL  0057 Pt had three medium size emesis. Jessica Liang,MD notified. Pt denies weakness.Rn will continue to monitor pt.  Orders for Reglan given.  Pt refused Reglan. Pt educated.Jessica Liang,MD notified.

## 2020-11-22 NOTE — Assessment & Plan Note (Signed)
A1c 13.5 today. Home blood sugar readings 200-300 with occasional 400s.  - Complicated by neuropathy including peripheral neuropathy, likely gastroparesis, and autonomic dysfunction.  - Bilateral lower extremity numbness and tingling with difficulty ambulating.  - Decreased PO intake with nausea, likely due to gastroparesis. Patient was taking Reglan previously, will restart. Poor PO intake and likely autonomic dysfunction could be impacting patient's dramatic orthostatic hypotension. Having symptoms of urinary frequency - Follows with ophthalmology, last seen 1 month ago.  - On Novolog 70/30 15-30 units BID. - Will switch metformin to Janumet 50-500 - Follow up in 1 week to uptitrate insulin as needed.

## 2020-11-22 NOTE — Progress Notes (Signed)
Internal Medicine Clinic Attending  I saw and evaluated the patient.  I personally confirmed the key portions of the history and exam documented by Dr. Elliot Gurney and I reviewed pertinent patient test results.  The assessment, diagnosis, and plan were formulated together and I agree with the documentation in the resident's note.   Patient is a 50 year old female with chronically uncontrolled insulin-dependent type 2 diabetes complicated by gastroparesis and peripheral neuropathy. She is here today with complaint of bilateral leg weakness, dizziness, and unsteady gait.  She is severely orthostatic today with in systolic BP of [id number] lying and 84 standing. She has been off all her medications except  insulin 70-30 and metformin. Daughter reports poor PO intake and early satiety. She drinks very little water. She likely has a component of autonomic dysfunction from her DM but on chart review she has been fluid responsive during prior recent admissions. In the setting of her gastroparesis and poor po intake, I think it is reasonable to admit for another IVF challenge. Recommend restarting Reglan. She needs titration of her insulin as well. Hgb A1c today is 13.4. She has some CKD but last GFR was > 60. Unclear why she is only on metformin and insulin. She has  medicaid. Could benefit from initiation of once weekly Trulicity or and oral DDP-4 inhibitor.   For her leg weakness, she had an MRI during recent ED visit for this complaint with advanced atrophy for age but no evidence of acute infarct. Symptoms have been slowly progressing over the past few months. She has a history of diabetic peripheral neuropathy which I suspect is driving her weakness. On exam she has significant distal muscle weakness, almost no ankle flexion or extension, and areflexic at her patellar tendons. Family is asking about checking a thiamine level. She does have a very poor diet so this is reasonable to rule out dry beriberi. Vitamin B12  level checked last year was WNL.

## 2020-11-22 NOTE — Patient Instructions (Addendum)
Dear [name],  Thank you for trusting me with your care.  Today we addressed the following concerns:  Diabetes:  - We checked your A1c today and it was elevated at 13.5, which is very high. This indicates that your diabetes is poorly controlled.  - Your diabetes is likely causing several other complications including kidney injury and neuropathy. It is likely that the numbness and tingling in your legs is a result of neuropathy related to your uncontrolled diabetes. It is also possible that your decreased appetite and poor oral intake is due to gastroparesis, a complication of uncontrolled diabetes. Lastly, you may be experiencing some autonomic dysfunction, due to nerve damage from your diabetes. This could be impacting your body's ability to regulate blood pressure and causing you to feel dizzy when you stand.  - We plan on switching you from metformin to a medication called Janumet 50-500. Take one tablet twice daily. - Continue taking your novolog 70/30 - Please continue to closely monitor your diet and limit your carbohydrate and sugar intake. - continue to monitor your blood sugar at home.  - please return to the clinic in 1 week with your blood sugar log and monitor so that we can determine if any further changes need to be made.  Orthostatic hypotension - you report feeling dizzy when you stand up. We checked your orthostatic vital signs and noted a significant drop in your blood pressure when standing. This is likely a contributing factor to your dizziness. Another potential contributing factor could be autonomic dysfunction due to your diabetes.  - we are giving you IV fluids to help improve your blood pressure - we would like you to restart your Reglan. This will help with the nausea you have been experiencing and will enable you to improve your oral intake and prevent dehydration.  Leg weakness: - You have been having some ongoing weakness and heaviness in your legs as well as  some difficulty walking.  - The ED performed a head MRI 7/19 which returned negative - This weakness is likely due to nerve damage from your uncontrolled diabetes. - We will check you thiamine and vitamin B12 levels to rule out other potential causes.  Depression: You have endorsed many symptoms consistent with depression.  - We would like to restart you on your Celexa

## 2020-11-22 NOTE — Progress Notes (Signed)
2017  Pt  admitted to room. Pt made comfortable. Pt CBG is 107. Pt alert and oriented. MD On-call notified of patient's presence on the unit.

## 2020-11-22 NOTE — Progress Notes (Signed)
CC: ED f/u  HPI:[name] is a 50 y.o. female who presents for evaluation of ED follow up for leg weakness. Please see individual problem based A/P for details.  Ataxia/ bilateral leg weakness - seen 7/19 due to worsening leg weakness and difficulty ambulating. Numbness in her feet ongoing for many months. Right leg feels more heavy than left. Difficulties moving leg. No back or neck pain at the time.  - ED MRI head normal - suspected neuropathy in the setting of uncontrolled DM2 - Gabapentin discontinued June 2022, patient says she couldn't tolerate it - B1 and B12 to evaluate given poor PO intake  DM with complications - diabetic neuropathy, HTN - past A1c 10.4, fluctuatons 70 -400s - on metformin 500 and novolog 70/30 -  difficulty taking medications? no -  blood sugar at home? 200-300, almost 400s -  diet? Bread and cheese,  -  last seen ophthalmologist? 1 month ago, planned for f/u [facility] [address]  - increased urinary frequency occasionally. Reports feeling fine currently No abd pain, endorses nausea - A1c elevated 13.5 today - switch from metformin to Janumet - Check BMP - follow up 1 week titrate insulin   Depression - Currently on citalopram '20mg'$  -  has medication Citalopram helped? - not taking - endorses changes in sleep, interst, enegery, feeling sad. - current concerns with eye, feet, nephew. Loss of interest, sleeping more, feeling tired  Adult failure to thrive - orthostatic hypotension. [id number] systolic lying down. 90's standing - very poor PO intake. Likely  - no difficulty talking or swallowing - Given 1L bolus NS - BMP  Depression, PHQ-9: Based on the patients  [place] Visit from 09/01/2020 in [place]  PHQ-9 Total Score 13      score we have 13.  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  She has not seen a dentist in about one year.  Dentist is on [address].  No fevers chills or systemic symptoms.  Diabetes has been poorly controlled for some time.  She said it has improved recently averaging around 140.  Called the dentist said they would not see patients until May 14th but the urgency o   Pap smear abnormality of cervix with LGSIL    Routine/ritual circumcision    Type II diabetes mellitus ([place])    Uveitis    Review of Systems:   Review of Systems  Constitutional:  Positive for weight loss.  HENT: Negative.    Eyes: Negative.   Respiratory: Negative.    Cardiovascular: Negative.   Gastrointestinal:  Positive for nausea. Negative for abdominal pain.  Genitourinary:  Positive for frequency.  Skin: Negative.   Neurological:  Positive for tingling, sensory change and weakness.  Psychiatric/Behavioral:  Positive for depression.     Physical Exam: There  were no vitals filed for this visit.   General:  HEENT: Cataract left eye, Conjunctiva nl , antiicteric sclerae, moist mucous membranes, no exudate or erythema Cardiovascular: Normal rate, regular rhythm.  No murmurs, rubs, or gallops Pulmonary : Equal breath sounds, No wheezes, rales, or rhonchi Abdominal: soft, nontender,  bowel sounds present Ext: Areflexic, decreased strength in bilateral lower extremities 3-4/5. Decreased sensation with diminished vibratory sense. Difficulty with heel to shin bilaterally.  Reflexes  diminished in bilateral upper extremities.   Assessment & Plan:   See Encounters Tab for problem based charting.  Patient seen with Dr. Philipp Ovens

## 2020-11-22 NOTE — H&P (Addendum)
Date: 11/22/2020               Patient Name:  Jennifer [name] MRN: [id number]  DOB: Oct 17, 1970 Age / Sex: 50 y.o., female   PCP: Jose Persia, MD         Medical Service: Internal Medicine Teaching Service         Attending Physician: Dr. Evette Doffing, Mallie Mussel, *    First Contact: Dr. Raymondo Band Pager: [phone]  Second Contact: Dr. Marva Panda Pager: [phone]       After Hours (After 5p/  First Contact Pager: [phone]  weekends / holidays): Second Contact Pager: [phone]   Chief Complaint: dizziness, diabetes concern  History of Present Illness: Jennifer [name] is a 50 y.o. female with past medical history of type 2 diabetes, HFpEF, atrial fibrillation, hypotension, early satiety, lower extremity weakness, who presents as direct admit from Internal Medicine Clinic due to orthostatic hypotension and uncontrolled diabetes.   Her son, Marcy Siren, was present and her daughter was on the phone to assist in translation and providing history. She states that starting around October 2021 the patient began having issues with nausea and some vomiting, as well as worsening dizziness. The patient has been evaluated numerous times for these complaints both in the ED and clinic with concerns for autonomic dysfunction related to her diabetes causing gastroparesis and orthostatic hypotension. She has been trialed on Reglan in the past for gastroparesis however is not taking the medication at this time and the reasoning for this was difficult to obtain. She states that she drinks 5 water bottles per day and her children add electrolyte packets to help with her hydration status. Her son does state however that he feels she has had decreased oral intake recently attributed to early satiety. She endorses adherence to medications.  Evaluations over last 1 year: 12/05/2019: evaluated in ED for dizziness, weakness; provider felt symptoms were related to uncontrolled DM 02/08/2020: evaluated in Internal Medicine Clinic  complaining of feet feeling heavy, attributed to HFpEF diagnosed by TTE May 2020 that showed EF 50-55% 03/10/2020: evaluated in Internal Medicine Clinic for symptomatic hypotension thought to be due to poor oral intake and dehydration 03/13/2020: evaluated in ED and found to have DKA, left foot cellulitis and myositis 05/29/2020: hospital follow-up for pyelonephritis and hyperglycemia, was symptomatically hypotensive at 86/58 which improved with 1L NS 05/31/2020: evaluated for early satiety and hypotension; trulicty discontinued at that time and suspected that this is due to gastroparesis 06/15/2020: admitted from ED with HHS, AKI per dehydration from [facility] 06/28/2020: evaluated via telehaelth for nausea and vomiting; advised to go to ED where she was found to have recurrent UTI 07/03/2020: hospital f/u found to have continued elevated glucose and UTI treatment non-adherence.  08/09/2020: evaluated in Internal Medicine Clinic and found to have blood glucose levels ranging 250-500 with admitted not taking medications; was hypotensive 80/48 thought again to be related to autonomic dysfunction 09/01/2020: evaluated at Internal Medicine Clinic due to weight loss, thought it could be related to gastroparesis and started on Reglan, hypotensive at 75/44-->83/45 improved by PO fluids 10/02/2020: evaluated in ED for AMS, was hypotensive then resolved with fluids. 11/07/2020: evaluated in ED for leg weakness and difficulty ambulating, attributed to peripheral neuropathy; hypotension of 73/53 that self-resolved  Denies headache, chest pain, shortness of breath, generalized weakness, urinary frequency, urinary burning, urinary urgency, diarrhea or constipation, fainting. Endorses dizziness, instability due to R foot pain and weakness, bloating, abdominal discomfort soon after eating or drinking larger portions.  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  She has not seen a dentist in about one year.  Dentist is on [address].  No fevers chills or systemic symptoms.  Diabetes has been poorly controlled for some time.  She said it has improved recently averaging around 140.  Called the dentist said they would not see patients until May 14th but the urgency o   Pap smear abnormality of cervix with LGSIL    Routine/ritual circumcision    Type II diabetes mellitus ([place])    Uveitis     Family History:  Father: diabetes  Social History: Patient lives at home with her family and is able to carry out ADLs. Sometimes her children do have to remind her to take her medications but they reiterate that she cares for her own needs for the most part. She had ankle surgery earlier this year after fracturing the R ankle in a fall and has since had weakness in the R foot and ankle and weakness due to this.   Review of Systems: A complete ROS was negative except as per HPI.   Physical Exam: Blood pressure (!) 147/72, pulse 76, temperature 98.5 F (36.9 C), temperature source Oral, resp. rate 16, last menstrual period 08/21/2016. Physical Exam Vitals reviewed.  Constitutional:      General: She is not in acute distress.     Appearance: Normal appearance.  Cardiovascular:     Rate and Rhythm: Regular rhythm. Tachycardia present.     Heart sounds: Normal heart sounds.  Pulmonary:     Effort: Pulmonary effort is normal.     Breath sounds: Normal breath sounds.  Abdominal:     Palpations: Abdomen is soft.     Tenderness: There is no abdominal tenderness.  Musculoskeletal:     Right lower leg: Tenderness present. 1+ Edema present.     Left lower leg: No tenderness. 1+ Edema present.     Right ankle: Swelling present. Tenderness present.     Left ankle: Swelling present. No tenderness.     Right foot: Swelling and tenderness present.     Left foot: No swelling.  Skin:    General: Skin is cool and dry.     Capillary Refill: Capillary refill takes 2 to 3 seconds.  Neurological:     General: No focal deficit present.     Mental Status: She is alert and oriented to person, place, and time.     Comments: Motor: equal but diminished effort thorughout, at least 2/5 bilateral lower extremities. Low bulk bilateral lower extremities. Sensory: Sensation is grossly intact and equal LEs Plantars: Toes  are downgoing bilaterally.  Psychiatric:        Attention and Perception: Attention normal.        Mood and Affect: Mood and affect normal.    Assessment & Plan by Problem:  Amarae Billman is a 50 y.o. female with past medical history of type 2 diabetes, HFpEF, atrial fibrillation, hypotension, early satiety, lower extremity weakness, who presents as direct admit from Internal Medicine Clinic due to orthostatic hypotension and uncontrolled diabetes.   #Hypotension Recurrent episodes of hypotension that have been worked up several times and attributed to autonomic dysfunction and poor PO intake. She has historically bene fluid responsive. - Admission BP 147/72, initial orthostatic readings of Lying 223/92 / 79 bpm Sitting 210/91 / 90 bpm Standing 180/78 / 89 bpm - Orthostatic values obtained prior to admission Lying  141/78 / 81 bpm Sitting 114/67 / 79 bpm Standing 84/49 / 89 bpm  #Type 2 diabetes mellitus Patient endorses adherence to diabetic management including NovoLOG 70-30 30 units in the morning and 15 units in the evening, as well as metformin 1,000 mg twice daily. Last insulin injection was earlier this morning and she has not taken metformin today. Most recent HbA1c was 13.5%, blood glucose of 294 at that time. She has had breakfast today per her daughter however that is all she has eaten.  - Very labile blood sugars since hospital admission ranging from 103 to 59, corrected most recently to 93 with juice consumption - CMP from earlier today showed hyponatremia however when corrected for hyperglycemia it is normal at 137. Anion gap of 8. Low concern for DKA at this time. - Linagliptin 5 mg daily - Metformin 1,000 mg twice daliy - novoLOG SS injection three times daily with meals  #HFpEF Diagnosed via TTE October 2021, EF 50-55%. Patient appears clinically hypovolemic and family endorses decreased PO intake.  - LR 1,000 mL bolus  #Atrial fibrillation History of atrial fibrillation however most recent EKG 10/2020 revealed sinus rhythm; regular rhythm with tachycardia auscultated on physical exam. It does not appear that she has been taking home anticoagulants.  #CKD stage 3 Initial labs showed BUN/Cr 27/1.18 and GFR 57. Repeat labs resulted at 22:04 11/22/2020 showed improvement to BUN/Cr 26/1.00 and GFR >60. She has received LR 1L bolus since admission and received 1L bolus in clinic prior to admission.  #Early satiety Gastroparesis likely a result of uncontrolled diabetes. She has been prescribed Reglan in the past however has not been taking it. - Ordered Reglan 5 mg three times daily before meals and at bedtime however patient has refused this so far.  #Lower extremity weakness Patient has a history of diabetic neuropathy. She recently had surgery on her R ankle after a closed fracture and  reports continued weakness and pain in the ankle. She exhibited limited ability to dorsiflex or plantar flex bilateral feet on physical exam and struggled to raise bilateral legs against gravity. - Vitamin B12 254, thiamine pending  DVT prophylaxis: Lovenox 40 mg injection once daily  Dispo: Admit patient to Observation with expected length of stay less than 2 midnights.  SignedFarrel Gordon, DO 11/22/2020, 11:13 PM  Pager: [phone] After 5pm on weekdays and 1pm on weekends: On Call pager: [phone]

## 2020-11-22 NOTE — Assessment & Plan Note (Signed)
Persistent nausea likely result from diabetic complications including gastroparesis. Patient has been taking Reglan in the past, but suddenly stopped. - Will restart Reglan

## 2020-11-23 ENCOUNTER — Encounter (HOSPITAL_COMMUNITY): Payer: Self-pay | Admitting: Student in an Organized Health Care Education/Training Program

## 2020-11-23 ENCOUNTER — Other Ambulatory Visit: Payer: Self-pay

## 2020-11-23 ENCOUNTER — Telehealth: Payer: Self-pay | Admitting: *Deleted

## 2020-11-23 ENCOUNTER — Other Ambulatory Visit: Payer: Self-pay | Admitting: Internal Medicine

## 2020-11-23 DIAGNOSIS — E1122 Type 2 diabetes mellitus with diabetic chronic kidney disease: Secondary | ICD-10-CM | POA: Diagnosis present

## 2020-11-23 DIAGNOSIS — E861 Hypovolemia: Secondary | ICD-10-CM | POA: Diagnosis present

## 2020-11-23 DIAGNOSIS — N1831 Chronic kidney disease, stage 3a: Secondary | ICD-10-CM | POA: Diagnosis present

## 2020-11-23 DIAGNOSIS — D509 Iron deficiency anemia, unspecified: Secondary | ICD-10-CM | POA: Diagnosis present

## 2020-11-23 DIAGNOSIS — Z8616 Personal history of COVID-19: Secondary | ICD-10-CM | POA: Diagnosis not present

## 2020-11-23 DIAGNOSIS — I5032 Chronic diastolic (congestive) heart failure: Secondary | ICD-10-CM | POA: Diagnosis present

## 2020-11-23 DIAGNOSIS — E1165 Type 2 diabetes mellitus with hyperglycemia: Secondary | ICD-10-CM | POA: Diagnosis present

## 2020-11-23 DIAGNOSIS — E860 Dehydration: Secondary | ICD-10-CM | POA: Diagnosis present

## 2020-11-23 DIAGNOSIS — R140 Abdominal distension (gaseous): Secondary | ICD-10-CM | POA: Diagnosis present

## 2020-11-23 DIAGNOSIS — I951 Orthostatic hypotension: Secondary | ICD-10-CM | POA: Diagnosis present

## 2020-11-23 DIAGNOSIS — R6881 Early satiety: Secondary | ICD-10-CM | POA: Diagnosis present

## 2020-11-23 DIAGNOSIS — I130 Hypertensive heart and chronic kidney disease with heart failure and stage 1 through stage 4 chronic kidney disease, or unspecified chronic kidney disease: Secondary | ICD-10-CM | POA: Diagnosis present

## 2020-11-23 DIAGNOSIS — Z7901 Long term (current) use of anticoagulants: Secondary | ICD-10-CM | POA: Diagnosis not present

## 2020-11-23 DIAGNOSIS — E785 Hyperlipidemia, unspecified: Secondary | ICD-10-CM | POA: Diagnosis present

## 2020-11-23 DIAGNOSIS — K3184 Gastroparesis: Secondary | ICD-10-CM | POA: Diagnosis present

## 2020-11-23 DIAGNOSIS — H5462 Unqualified visual loss, left eye, normal vision right eye: Secondary | ICD-10-CM | POA: Diagnosis present

## 2020-11-23 DIAGNOSIS — E1142 Type 2 diabetes mellitus with diabetic polyneuropathy: Secondary | ICD-10-CM | POA: Diagnosis present

## 2020-11-23 DIAGNOSIS — R609 Edema, unspecified: Secondary | ICD-10-CM | POA: Diagnosis not present

## 2020-11-23 DIAGNOSIS — E871 Hypo-osmolality and hyponatremia: Secondary | ICD-10-CM | POA: Diagnosis present

## 2020-11-23 DIAGNOSIS — R54 Age-related physical debility: Secondary | ICD-10-CM | POA: Diagnosis present

## 2020-11-23 DIAGNOSIS — F32A Depression, unspecified: Secondary | ICD-10-CM | POA: Diagnosis present

## 2020-11-23 DIAGNOSIS — E11649 Type 2 diabetes mellitus with hypoglycemia without coma: Secondary | ICD-10-CM | POA: Diagnosis present

## 2020-11-23 DIAGNOSIS — Z833 Family history of diabetes mellitus: Secondary | ICD-10-CM | POA: Diagnosis not present

## 2020-11-23 DIAGNOSIS — E1143 Type 2 diabetes mellitus with diabetic autonomic (poly)neuropathy: Secondary | ICD-10-CM | POA: Diagnosis present

## 2020-11-23 DIAGNOSIS — I4891 Unspecified atrial fibrillation: Secondary | ICD-10-CM | POA: Diagnosis present

## 2020-11-23 LAB — CBC
HCT: 36.1 % (ref 36.0–46.0)
Hemoglobin: 12.1 g/dL (ref 12.0–15.0)
MCH: 27.8 pg (ref 26.0–34.0)
MCHC: 33.5 g/dL (ref 30.0–36.0)
MCV: 83 fL (ref 80.0–100.0)
Platelets: 155 10*3/uL (ref 150–400)
RBC: 4.35 MIL/uL (ref 3.87–5.11)
RDW: 11.7 % (ref 11.5–15.5)
WBC: 6.9 10*3/uL (ref 4.0–10.5)
nRBC: 0 % (ref 0.0–0.2)

## 2020-11-23 LAB — BASIC METABOLIC PANEL
Anion gap: 5 (ref 5–15)
BUN: 24 mg/dL — ABNORMAL HIGH (ref 6–20)
CO2: 25 mmol/L (ref 22–32)
Calcium: 9.3 mg/dL (ref 8.9–10.3)
Chloride: 104 mmol/L (ref 98–111)
Creatinine, Ser: 1.11 mg/dL — ABNORMAL HIGH (ref 0.44–1.00)
GFR, Estimated: >60 mL/min (ref >60)
Glucose, Bld: 161 mg/dL — ABNORMAL HIGH (ref 70–99)
Potassium: 4.6 mmol/L (ref 3.5–5.1)
Sodium: 134 mmol/L — ABNORMAL LOW (ref 135–145)

## 2020-11-23 LAB — GLUCOSE, CAPILLARY
Glucose-Capillary: 138 mg/dL — ABNORMAL HIGH (ref 70–99)
Glucose-Capillary: 151 mg/dL — ABNORMAL HIGH (ref 70–99)
Glucose-Capillary: 186 mg/dL — ABNORMAL HIGH (ref 70–99)
Glucose-Capillary: 188 mg/dL — ABNORMAL HIGH (ref 70–99)
Glucose-Capillary: 205 mg/dL — ABNORMAL HIGH (ref 70–99)
Glucose-Capillary: 218 mg/dL — ABNORMAL HIGH (ref 70–99)

## 2020-11-23 LAB — SARS CORONAVIRUS 2 (TAT 6-24 HRS): SARS Coronavirus 2: NEGATIVE

## 2020-11-23 MED ORDER — CYANOCOBALAMIN 1000 MCG/ML IJ SOLN
1000.0000 ug | Freq: Once | INTRAMUSCULAR | Status: AC
  Administered 2020-11-23: 1000 ug via INTRAMUSCULAR
  Filled 2020-11-23: qty 1

## 2020-11-23 MED ORDER — GLUCERNA SHAKE PO LIQD
237.0000 mL | Freq: Two times a day (BID) | ORAL | Status: DC
  Administered 2020-11-23 – 2020-11-29 (×12): 237 mL via ORAL

## 2020-11-23 MED ORDER — LACTATED RINGERS IV BOLUS
1000.0000 mL | Freq: Once | INTRAVENOUS | Status: AC
  Administered 2020-11-23: 1000 mL via INTRAVENOUS

## 2020-11-23 MED ORDER — APIXABAN 5 MG PO TABS
5.0000 mg | ORAL_TABLET | Freq: Two times a day (BID) | ORAL | Status: DC
  Administered 2020-11-23 – 2020-11-29 (×13): 5 mg via ORAL
  Filled 2020-11-23 (×13): qty 1

## 2020-11-23 MED ORDER — INSULIN DETEMIR 100 UNIT/ML ~~LOC~~ SOLN
10.0000 [IU] | Freq: Every day | SUBCUTANEOUS | Status: DC
  Administered 2020-11-23: 10 [IU] via SUBCUTANEOUS
  Filled 2020-11-23 (×2): qty 0.1

## 2020-11-23 MED ORDER — ENSURE MAX PROTEIN PO LIQD
11.0000 [oz_av] | Freq: Every day | ORAL | Status: DC
  Administered 2020-11-23 – 2020-11-27 (×5): 11 [oz_av] via ORAL

## 2020-11-23 MED ORDER — METOCLOPRAMIDE HCL 5 MG PO TABS
5.0000 mg | ORAL_TABLET | Freq: Three times a day (TID) | ORAL | Status: DC
  Administered 2020-11-23 – 2020-11-29 (×26): 5 mg via ORAL
  Filled 2020-11-23 (×28): qty 1

## 2020-11-23 MED ORDER — LACTATED RINGERS IV SOLN: INTRAVENOUS | Status: AC

## 2020-11-23 MED ORDER — COSYNTROPIN 0.25 MG IJ SOLR
0.2500 mg | Freq: Once | INTRAMUSCULAR | Status: AC
  Administered 2020-11-24: 0.25 mg via INTRAVENOUS
  Filled 2020-11-23: qty 0.25

## 2020-11-23 MED ORDER — ADULT MULTIVITAMIN W/MINERALS CH
1.0000 | ORAL_TABLET | Freq: Every day | ORAL | Status: DC
  Administered 2020-11-23 – 2020-11-29 (×7): 1 via ORAL
  Filled 2020-11-23 (×7): qty 1

## 2020-11-23 NOTE — Progress Notes (Addendum)
Inpatient Diabetes Program Recommendations  AACE/ADA: New Consensus Statement on Inpatient Glycemic Control (2015)  Target Ranges:  Prepandial:   less than 140 mg/dL      Peak postprandial:   less than 180 mg/dL (1-2 hours)      Critically ill patients:  140 - 180 mg/dL   Lab Results  Component Value Date   GLUCAP 188 (H) 11/23/2020   HGBA1C 13.5 (A) 11/22/2020    Review of Glycemic Control Results for AMARY, CROGNALE (MRN [id number]) as of 11/23/2020 13:39  Ref. Range 11/22/2020 14:05 11/22/2020 19:42 11/22/2020 21:55 11/22/2020 22:29 11/23/2020 01:34 11/23/2020 07:42 11/23/2020 11:38 11/23/2020 13:09  Glucose-Capillary Latest Ref Range: 70 - 99 mg/dL 411 (H) 107 (H) 59 (L) 93 138 (H) 151 (H) 186 (H) 188 (H)   Diabetes history: DM2 Outpatient Diabetes medications: 70/30 insulin 20 units am ,15 units pm,Janumet bid,Metformin 500 mg qd Current orders for Inpatient glycemic control: Levemir 10 units, Novolog 0-15 units tid,Tradjenta 5 mg, Metformin 1 gm bid  Inpatient Diabetes Program Recommendations:   Debera Lat saw patient in the office 08/22/20 and reviewed diabetes.Sent secure note to [facility] inquiring if she will be following patient regarding A1c. A1c has increased from 10.4 09/01/20 to current 13.5.  Spoke with patient with her daughter interpreting for her. Patient states understanding of A1c being elevated and plans to call for appointment with [facility] and check CBGs and take medications as prescribed.  Patient's daughter shared that patient has been under enormous stress with the incident of her 24 year old grandson in the hospital since April-May from swimming pool drowning. Discussed stress on diabetes and elevated CBGs.  Thank you, Nani Gasser. Dayton Sherr, RN, MSN, CDE  Diabetes Coordinator Inpatient Glycemic Control Team Team Pager [phone] (8am-5pm) 11/23/2020 1:44 PM

## 2020-11-23 NOTE — Significant Event (Signed)
Rapid Response Event Note   Reason for Call :  Hypotension  Initial Focused Assessment:  Called from the floor RN because the patient got up to the bathroom, became dizzy, and had near syncopal episode. Staff was able to get patient safely back to the bed without incident. Apparently, this has happened multiple times to her. As per her daughter, she has had a fall at home because of this. Looking back in her history, she has had positive response to fluid bolus. Her daughter also mentioned that recently her oral intake has been minimal. Her daughter also mentioned that she has been having loose stools recently.   Patient is lying in bed in no acute distress. She denies any chest pain, visual changes, or overt dizziness. She states that she is feeling better. Her pulse felt irregular and we obtained a 12-lead ekg. It showed that she was in a-fib rvr. The patient has is currently receiving enoxaparin for DVT prophylaxis during this hospitalization, but is not taking any anticoagulation at home. She was previously on apixaban, but she is not currently taking any DOAC.   BP 66/47, 99/70, 117/70 (most recent) HR 134 O2 98 CBG 188    Interventions:  Vitals taken EKG (afib) Tele-monitor placed on patient Fluid bolus gtt infusing  Plan of Care:  Discussed with staff and daughter about the importance of getting out of bed slowly. I recommend that she dangle her feet while sitting up for at least 30 seconds before arising.   Event Summary:   MD Notified:  Teaching service Call Time: [place] Time: [id number] End Time: [place], RN

## 2020-11-23 NOTE — Progress Notes (Signed)
Pt was ambulating to bathroom with tech. Got really dizzy and pt BP dropped to 70's/40's. Pt was cold and clammy also. Blood sugar was 188. Got pt back in bed and laying down, BP came back up to 110/70. Rapid nurse was called to come assess. EKG done showing A-fib RVR. MD was paged. Waiting on response.

## 2020-11-23 NOTE — Evaluation (Signed)
Occupational Therapy Evaluation Patient Details Name: Jennifer [name] MRN: [id number] DOB: [birth year] Today's Date: 11/23/2020    History of Present Illness pt is 50 y.o. female with past medical history of type 2 diabetes, HFpEF, atrial fibrillation, hypotension, early satiety, lower extremity weakness, who presents as direct admit from Internal Medicine Clinic due to orthostatic hypotension and uncontrolled diabetes   Clinical Impression   Pt seen with Dtr in room, per report pt was using RW for mobility at times in room, increased frequency of falls in last few weeks, persistent reports of dizziness at home and endorsing generalized weakness throughout LE>UE. Although pt lives at home with multiple family members 24hr S/A is unavailable at time of d/c. Session limited significantly by significantly soft BP on assessment at bed level and with attempt at repositioning/rolling/elevating HOB. Please see ratings below. At bed level pt demo's decreased activity tolerance and strength, and requiring at least min-mod A for repositioning, and increased A for ADL's at this time. Dtr reports family is interested in post acute therapy at SNF level at time of d/c if appropriate. OT will continue to assess needs, but with increased incidence of falls, and presentation of weakness anticipate pt may be appropriate.   BP:  supine R arm- 75/62 HR 118 O2 100 Supine L arm- 76/47 HR 144 O2 100 Post rolling/supine scooting, elevation of HOB: 61/42 HR 112 O2 100  Nursing made aware of status of BP at bed level and with repositioning. Deferring further OOB activity this date for safety.     Follow Up Recommendations  SNF;Supervision/Assistance - 24 hour;Home health OT    Equipment Recommendations   (will need to continue to assess vs TBD at next LOC)    Recommendations for Other Services       Precautions / Restrictions Precautions Precautions: Other (comment) Precaution Comments: monitor  BP/orthostatics Restrictions Weight Bearing Restrictions: No      Mobility Bed Mobility Overal bed mobility: Needs Assistance Bed Mobility: Rolling Rolling: Min assist         General bed mobility comments: mod A and reliance on bed controls for anterior scooting to HOB.    Transfers                      Balance                                           ADL either performed or assessed with clinical judgement   ADL Overall ADL's : Needs assistance/impaired Eating/Feeding: Set up;Bed level   Grooming: Set up;Bed level   Upper Body Bathing: Minimal assistance;Bed level       Upper Body Dressing : Minimal assistance;Bed level                     General ADL Comments: full functional assessment limited by significantly soft BP and limited tolerance for bed mobility/transfer this date. assessment completed at bed level at this time.     Vision Baseline Vision/History: Legally blind (blind in L eye)       Perception     Praxis      Pertinent Vitals/Pain Pain Assessment: No/denies pain     Hand Dominance Right   Extremity/Trunk Assessment Upper Extremity Assessment Upper Extremity Assessment: Generalized weakness   Lower Extremity Assessment Lower Extremity Assessment: Defer to PT evaluation   Cervical / Trunk  Assessment Cervical / Trunk Assessment: Normal   Communication Communication Communication: Prefers language other than English   Cognition Arousal/Alertness: Awake/alert Behavior During Therapy: WFL for tasks assessed/performed Overall Cognitive Status: Within Functional Limits for tasks assessed                                 General Comments: appears WFL, daughter prefers to communicate for language barrier despite offering contacting interpreter. would benefit form formal interpreter to determine any cognitive deficits/barriers at next session   General Comments  noted pt with soft BP and  elevated HR during assessment deferred OOB at this time for safety    Exercises     Shoulder Instructions      Home Living Family/patient expects to be discharged to:: Private residence Living Arrangements: Spouse/significant other;Children Available Help at Discharge: Family;Available PRN/intermittently Type of Home: Apartment Home Access: Level entry     Home Layout: One level     Bathroom Shower/Tub: Teacher, early years/pre: Standard Bathroom Accessibility: Yes   Home Equipment: Cane - single point   Additional Comments: acces to RW, and chair for tub (a normal black chair per daughter)      Prior Functioning/Environment Level of Independence: Independent        Comments: dtr reports that pt requires intermittent A for medication management, and A for in and out of tub shower but otherwise is mod indep with ADL's and mobility in the home        OT Problem List: Decreased strength;Decreased activity tolerance;Impaired balance (sitting and/or standing);Decreased coordination;Decreased cognition;Decreased safety awareness;Decreased knowledge of use of DME or AE;Decreased knowledge of precautions      OT Treatment/Interventions: Self-care/ADL training;Therapeutic exercise;DME and/or AE instruction;Therapeutic activities;Patient/family education;Balance training    OT Goals(Current goals can be found in the care plan section) Acute Rehab OT Goals Patient Stated Goal: to feel better OT Goal Formulation: With patient Time For Goal Achievement: 12/07/20 Potential to Achieve Goals: Fair ADL Goals Pt/caregiver will Perform Home Exercise Program: Increased strength;Both right and left upper extremity;With Supervision;With written HEP provided Additional ADL Goal #1: pt will be mod indep with bed moblity in order to improve indep and safety with completion of basic self care tasksn  OT Frequency: Min 2X/week   Barriers to D/C:            Co-evaluation               AM-PAC OT "6 Clicks" Daily Activity     Outcome Measure Help from another person eating meals?: A Little Help from another person taking care of personal grooming?: A Little Help from another person toileting, which includes using toliet, bedpan, or urinal?: A Lot Help from another person bathing (including washing, rinsing, drying)?: A Lot Help from another person to put on and taking off regular upper body clothing?: A Little Help from another person to put on and taking off regular lower body clothing?: A Lot 6 Click Score: 15   End of Session Nurse Communication: Mobility status;Other (comment) (BP throughout session)  Activity Tolerance: Patient limited by lethargy Patient left: in bed;with bed alarm set;with family/visitor present;with call bell/phone within reach  OT Visit Diagnosis: Muscle weakness (generalized) (M62.81);Repeated falls (R29.6)                Time: 1000-1031 OT Time Calculation (min): 31 min Charges:  OT General Charges $OT Visit: 1 Visit OT Evaluation $OT  Eval Low Complexity: 1 Low  Ruey Storer OTR/L acute rehab services Office: [phone]  11/23/2020, 10:31 AM

## 2020-11-23 NOTE — Progress Notes (Signed)
Initial Nutrition Assessment  DOCUMENTATION CODES:   Not applicable  INTERVENTION:   -Glucerna Shake po BID, each supplement provides 220 kcal and 10 grams of protein  -Ensure Max po daily, each supplement provides 150 kcal and 30 grams of protein.   -MVI with minerals daily  NUTRITION DIAGNOSIS:   Inadequate oral intake related to chronic illness, decreased appetite (gastroparesis) as evidenced by per patient/family report.  GOAL:   Patient will meet greater than or equal to 90% of their needs  MONITOR:   PO intake, Supplement acceptance, Labs, Weight trends, Skin, I & O's  REASON FOR ASSESSMENT:   Malnutrition Screening Tool    ASSESSMENT:   Jennifer [name] is a 50 y.o. female with past medical history of type 2 diabetes, HFpEF, atrial fibrillation, hypotension, early satiety, lower extremity weakness, who presents as direct admit from Internal Medicine Clinic due to orthostatic hypotension and uncontrolled diabetes.  Pt admitted with hypotension, uncontrolled DM, and possible gastroparesis.   Reviewed I/O's: +240 ml x 24 hours  Attempted to speak with pt x 2. Pt lying in bed with blankets covering her body. She did not respond to voice. Per RN notes, pt has been refusing care. No family present to provide additional history.   No meal completion data available to assess at this time.   Reviewed wt hx; pt has experienced a 3.9% wt loss over the past 3 months, which is not significant for time frame.   Medications reviewed and include reglan and lactated ringers infusion @ 100 ml/hr.   Lab Results  Component Value Date   HGBA1C 13.5 (A) 11/22/2020   PTA DM medications are .   Labs reviewed: Na: 134, CBGS: 93-151 (inpatient orders for glycemic control are 0-15 units insulin aspart TID with meals, 10 units insulin detemir daily at bedtime, 5 gm linagliptin daily and 1000 mg metformin BID).    Diet Order:   Diet Order             Diet Carb Modified Fluid  consistency: Thin; Room service appropriate? Yes  Diet effective now                   EDUCATION NEEDS:   No education needs have been identified at this time  Skin:  Skin Assessment: Reviewed RN Assessment  Last BM:  11/22/20  Height:   Ht Readings from Last 1 Encounters:  11/22/20 '5\' 7"'$  (1.702 m)    Weight:   Wt Readings from Last 1 Encounters:  11/22/20 69.3 kg    Ideal Body Weight:  61.4 kg  BMI:  There is no height or weight on file to calculate BMI.  Estimated Nutritional Needs:   Kcal:  [id number]  Protein:  90-105 grams  Fluid:  > 1.7 L    Loistine Chance, RD, LDN, [place] Registered Dietitian II Certified Diabetes Care and Education Specialist Please refer to [facility] for RD and/or RD on-call/weekend/after hours pager

## 2020-11-23 NOTE — Progress Notes (Addendum)
HD#1 SUBJECTIVE:  Patient Summary: Jennifer [name] is a 50 y.o. with a pertinent PMH of poorly uncontrolled diabetes, peripheral neuropathy, atrial fibrillation, and HFpEF, who presented directly from the [facility] for orthostatic hypotension.  Overnight Events: No acute events overnight reported.  Interim History: This is hospital day 1 for Jennifer [name] who was seen and evaluated at the bedside this morning. Her daughter is present and serving as a Optometrist during our encounter, as the patient is primarily arabic speaking. Patient states that she is feeling better compared to yesterday. She does endorse some abdominal pain and has had a decrease in her appetite. Her daughter notes that she has early satiety and generally has poor oral intake. Patient remains orthostatic, likely secondary to dehydration from poor oral intake from possible gastroparesis.   OBJECTIVE:  Vital Signs: Vitals:   11/23/20 1304 11/23/20 1308 11/23/20 1311 11/23/20 1411  BP: 91/76 (!) 66/47 (!) 106/92 (!) 101/58  Pulse: 73 (!) 155 82 90  Resp:      Temp:    98.2 F (36.8 C)  TempSrc:    Oral  SpO2:    98%   Supplemental O2: Room Air SpO2: 98 %  There were no vitals filed for this visit.   Intake/Output Summary (Last 24 hours) at 11/23/2020 1430 Last data filed at 11/23/2020 0544 Gross per 24 hour  Intake 240 ml  Output --  Net 240 ml   Net IO Since Admission: 240 mL [11/23/20 1430]  Physical Exam: General: Pleasant, comfortable-appearing female laying in bed. No acute distress and does not appear to be in pain. CV: Irregular, tachycardic rate and irregular rhythm. No murmurs, rubs, or gallops. 1+ bilateral lower extremity edema Pulmonary: Lungs CTAB. Normal effort. No wheezing or rales. Abdominal: Soft, nontender, nondistended. Normal bowel sounds. Extremities: Palpable radial and DP pulses. Normal ROM. Skin: Warm and dry. No obvious rash or lesions. Neuro: A&Ox3. Moves all extremities but has slightly  reduced strength in right lower extremity, about 3/5 compared to 4/5 on the left. Normal sensation.  Psych: Normal mood and affect   Patient [name]/Drains/Airways Status     Active Line/Drains/Airways     Name Placement date Placement time Site Days   Peripheral IV 11/22/20 20 G 1.88" Right;Lateral Forearm 11/22/20  1621  Forearm  1   Incision (Closed) 05/05/20 Ankle Right 05/05/20  1051  -- 202            Pertinent Labs: CBC Latest Ref Rng & Units 11/23/2020 11/22/2020 11/07/2020  WBC 4.0 - 10.5 K/uL 6.9 5.5 4.3  Hemoglobin 12.0 - 15.0 g/dL 12.1 12.2 12.2  Hematocrit 36.0 - 46.0 % 36.1 37.7 37.5  Platelets 150 - 400 K/uL 155 180 160    CMP Latest Ref Rng & Units 11/23/2020 11/22/2020 11/22/2020  Glucose 70 - 99 mg/dL 161(H) 69(L) 294(H)  BUN 6 - 20 mg/dL 24(H) 26(H) 27(H)  Creatinine 0.44 - 1.00 mg/dL 1.11(H) 1.00 1.18(H)  Sodium 135 - 145 mmol/L 134(L) 133(L) 134(L)  Potassium 3.5 - 5.1 mmol/L 4.6 3.6 4.0  Chloride 98 - 111 mmol/L 104 101 101  CO2 22 - 32 mmol/L '25 23 25  '$ Calcium 8.9 - 10.3 mg/dL 9.3 9.3 9.6  Total Protein 6.5 - 8.1 g/dL - 6.4(L) -  Total Bilirubin 0.3 - 1.2 mg/dL - 0.6 -  Alkaline Phos 38 - 126 U/L - 62 -  AST 15 - 41 U/L - 18 -  ALT 0 - 44 U/L - 12 -  Recent Labs    11/23/20 0742 11/23/20 1138 11/23/20 1309  GLUCAP 151* 186* 188*     Pertinent Imaging: No results found.  ASSESSMENT/PLAN:  Assessment: Principal Problem:   Orthostatic hypotension Active Problems:   Type II diabetes mellitus (HCC)   (HFpEF) heart failure with preserved ejection fraction (HCC)   Atrial fibrillation (HCC)   Early satiety   Lower extremity weakness   CKD (chronic kidney disease) stage 3, GFR 30-59 ml/min (HCC)   Plan: #Orthostatic hypotension Patient has had multiple episodes of hypotension that have been worked up several times and have all been attributed to autonomic dysfunction and poor oral intake. Initial orthostatics prior to admission were: lying  141/78, sitting 114/67, and standing 84/49. Patient received 1L LR bolus. Had episode of dizziness this afternoon and BP dropped to 70s/40s while standing and it increased to 110/70 when laying back down. - LR 100 cc/hr x10 hours - Continue to monitor BP - ACTH Stim test tomorrow morning to rule out adrenal insufficiency  #Atrial fibrillation with RVR Patient has a known history of atrial fibrillation and had an irregular, tachycardic rate and irregular rhythm upon auscultation. She has not been taking home anticoagulants. - EKG ordered - Started on Eliquis 5 mg BID - Will not start AV nodal blocker as patient is hypotensive - If RVR persists, may need to consider amiodarone   #Type 2 diabetes mellitus with diabetic neuropathy Patient endorses adherence to her diabetic management including NovoLOG 70-30 30 units in the morning and 15 units in the evening, as well as metformin 1,000 mg twice daily. HbA1c was 13.5% as of 8/3. Was supposed to start on Janumet as of yesterday. Will continue her on metformin, linagliptin, Levemir, and SSI. Blood sugars have been ranging from 150s-180s. - Continue Linagliptin 5 mg daily - Continue metformin 1,000 mg twice daliy - Continue Levemir 10 units nightly and novoLOG SS injection three times daily with meals - Will need to optimize diabetes regimen upon discharge   #HFpEF Diagnosed via TTE October 2021, EF 50-55%. Patient appears clinically hypovolemic secondary to poor oral intake. Received LR 1,000 mL bolus and will continue to monitor for any signs of volume overload.   #CKD stage 3a Initial labs showed BUN/Cr 27/1.18 and GFR 57. Kidney function remains stable upon recheck this morning. - IV fluids as above - Continue to monitor renal function   #Early satiety Likely diagnosis of gastroparesis likely a result of poorly controlled diabetes although a gastric emptying study has never been performed. She has been prescribed Reglan in the past, however  has not been taking it. - Reglan 5 mg TID - Continue to monitor oral intake   #Lower extremity weakness Patient has a history of diabetic neuropathy and a recent surgery on her right ankle after a closed fracture. She reports continued weakness and pain in the ankle and states that it feels heavy. She had decreased strength in her right lower extremity as compared to the left. Vitamin B12 level 254. - 1000 mcg B12 injection - Thiamine pending  - PT/OT eval and treat    Best Practice: Diet: carb modified diet IVF: Fluids: LR, Rate:  100 cc/hr x 10 hrs VTE: enoxaparin (LOVENOX) injection 40 mg Start: 11/22/20 2115 Code: Full Therapy Recs: PT/OT recommending outpatient PT Family Contact: daughter, at bedside. DISPO: Anticipated discharge Home pending clinical improvement .  Signature: Buddy Duty, D.O.  Internal Medicine Resident, PGY-1 [facility]  Pager: [phone] 2:31 PM, 11/23/2020  Please contact the on call pager after 5 pm and on weekends at [phone].

## 2020-11-23 NOTE — Discharge Instructions (Addendum)
FOLLOW-UP INSTRUCTIONS:  Thank you for allowing [place] to be part of your care. 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  Please follow up with the following providers: A. Dr. Court Joy on 8/17 at 10:15am at the internal medicine clinic: [address] [facility] [phone]   Please note these changes made to your medications:    A. Medications to start:  Current Facility-Administered Medications:  - apixaban (ELIQUIS) tablet 5 mg, BID - insulin detemir (LEVEMIR) injection 15 Units, 15 Units, Subcutaneous, QHS - linagliptin (TRADJENTA) tablet 5 mg, 5 mg, Oral, Daily, **AND** metFORMIN (GLUCOPHAGE) tablet 1,000 mg, 1,000 mg, Oral, BID - novolog 3 Units, three times daily with meals  - metoCLOPramide (REGLAN) tablet 5 mg, 5 mg, Oral, TID AC & HS - midodrine (PROAMATINE) tablet 5 mg, 5 mg, Oral, BID WC  Please make sure to take your midodrine '5mg'$  twice a day, eliquis '5mg'$  twice a day, and your insulin as prescribed.   Please call our clinic if you have any questions or concerns, we may be able to help and keep you from a long and expensive emergency room wait. Our clinic and after hours phone number is [phone], the best time to call is Monday through Friday 9 am to 4 pm but there is always someone available 24/7 if you have an emergency. If you need medication refills please notify your pharmacy one week in advance and they will send [place] a request.       Information on my medicine - ELIQUIS (apixaban)  This medication education was reviewed with me or my healthcare representative as part of my discharge preparation.     Why was Eliquis prescribed for you? Eliquis was prescribed for you to reduce the risk of a blood clot forming that can cause a stroke if you have a medical condition called atrial fibrillation (a type of irregular heartbeat).  What do You need to know about Eliquis ? Take your Eliquis TWICE DAILY - one tablet in the morning and one  tablet in the evening with or without food. If you have difficulty swallowing the tablet whole please discuss with your pharmacist how to take the medication safely.  Take Eliquis exactly as prescribed by your doctor and DO NOT stop taking Eliquis without talking to the doctor who prescribed the medication.  Stopping may increase your risk of developing a stroke.  Refill your prescription before you run out.  After discharge, you should have regular check-up appointments with your healthcare provider that is prescribing your Eliquis.  In the future your dose may need to be changed if your kidney function or weight changes by a significant amount or as you get older.  What do you do if you miss a dose? If you miss a dose, take it as soon as you remember on the same day and resume taking twice daily.  Do not take more than one dose of ELIQUIS at the same time to make up a missed dose.  Important Safety Information A possible side effect of Eliquis is bleeding. You should call your healthcare provider right away if you experience any of the following: Bleeding from an injury or your nose that does not stop. Unusual colored urine (red or dark brown) or unusual colored stools (red or black). Unusual bruising for unknown reasons. A serious fall or if you hit your head (even if there is no bleeding).  Some medicines may interact with Eliquis and might increase your risk  of bleeding or clotting while on Eliquis. To help avoid this, consult your healthcare provider or pharmacist prior to using any new prescription or non-prescription medications, including herbals, vitamins, non-steroidal anti-inflammatory drugs (NSAIDs) and supplements.  This website has more information on Eliquis (apixaban): http://[email or web address].com/eliquis/home ==================================================  Atrial Fibrillation    Atrial fibrillation is a type of heartbeat that is irregular or fast. If you have this  condition, your heart beats without any order. This makes it hard for your heart to pump blood in a normal way. Atrial fibrillation may come and go, or it may become a long-lasting problem. If this condition is not treated, it can put you at higher risk for stroke, heart failure, and other heart problems.  What are the causes? This condition may be caused by diseases that damage the heart. They include: High blood pressure. Heart failure. Heart valve disease. Heart surgery. Other causes include: Diabetes. Thyroid disease. Being overweight. Kidney disease. Sometimes the cause is not known.  What increases the risk? You are more likely to develop this condition if: You are older. You smoke. You exercise often and very hard. You have a family history of this condition. You are a man. You use drugs. You drink a lot of alcohol. You have lung conditions, such as emphysema, pneumonia, or COPD. You have sleep apnea.  What are the signs or symptoms? Common symptoms of this condition include: A feeling that your heart is beating very fast. Chest pain or discomfort. Feeling short of breath. Suddenly feeling light-headed or weak. Getting tired easily during activity. Fainting. Sweating. In some cases, there are no symptoms.  How is this treated? Treatment for this condition depends on underlying conditions and how you feel when you have atrial fibrillation. They include: Medicines to: Prevent blood clots. Treat heart rate or heart rhythm problems. Using devices, such as a pacemaker, to correct heart rhythm problems. Doing surgery to remove the part of the heart that sends bad signals. Closing an area where clots can form in the heart (left atrial appendage). In some cases, your doctor will treat other underlying conditions.  Follow these instructions at home:  Medicines Take over-the-counter and prescription medicines only as told by your doctor. Do not take any new medicines  without first talking to your doctor. If you are taking blood thinners: Talk with your doctor before you take any medicines that have aspirin or NSAIDs, such as ibuprofen, in them. Take your medicine exactly as told by your doctor. Take it at the same time each day. Avoid activities that could hurt or bruise you. Follow instructions about how to prevent falls. Wear a bracelet that says you are taking blood thinners. Or, carry a card that lists what medicines you take. Lifestyle         Do not use any products that have nicotine or tobacco in them. These include cigarettes, e-cigarettes, and chewing tobacco. If you need help quitting, ask your doctor. Eat heart-healthy foods. Talk with your doctor about the right eating plan for you. Exercise regularly as told by your doctor. Do not drink alcohol. Lose weight if you are overweight. Do not use drugs, including cannabis.  General instructions If you have a condition that causes breathing to stop for a short period of time (apnea), treat it as told by your doctor. Keep a healthy weight. Do not use diet pills unless your doctor says they are safe for you. Diet pills may make heart problems worse. Keep all follow-up visits as told  by your doctor. This is important.  Contact a doctor if: You notice a change in the speed, rhythm, or strength of your heartbeat. You are taking a blood-thinning medicine and you get more bruising. You get tired more easily when you move or exercise. You have a sudden change in weight.  Get help right away if:    You have pain in your chest or your belly (abdomen). You have trouble breathing. You have side effects of blood thinners, such as blood in your vomit, poop (stool), or pee (urine), or bleeding that cannot stop. You have any signs of a stroke. "BE FAST" is an easy way to remember the main warning signs: B - Balance. Signs are dizziness, sudden trouble walking, or loss of balance. E - Eyes. Signs are  trouble seeing or a change in how you see. F - Face. Signs are sudden weakness or loss of feeling in the face, or the face or eyelid drooping on one side. A - Arms. Signs are weakness or loss of feeling in an arm. This happens suddenly and usually on one side of the body. S - Speech. Signs are sudden trouble speaking, slurred speech, or trouble understanding what people say. T - Time. Time to call emergency services. Write down what time symptoms started. You have other signs of a stroke, such as: A sudden, very bad headache with no known cause. Feeling like you may vomit (nausea). Vomiting. A seizure.  These symptoms may be an emergency. Do not wait to see if the symptoms will go away. Get medical help right away. Call your local emergency services (911 in the [place] Do not drive yourself to the hospital. Summary Atrial fibrillation is a type of heartbeat that is irregular or fast. You are at higher risk of this condition if you smoke, are older, have diabetes, or are overweight. Follow your doctor's instructions about medicines, diet, exercise, and follow-up visits. Get help right away if you have signs or symptoms of a stroke. Get help right away if you cannot catch your breath, or you have chest pain or discomfort. This information is not intended to replace advice given to you by your health care provider. Make sure you discuss any questions you have with your health care provider. Document Revised: 09/30/2018 Document Reviewed: 09/30/2018 Elsevier Patient Education  [place] With Diabetes  Foods with carbohydrates make your blood glucose level go up. Learning how to count carbohydrates can help you control your blood glucose levels. First, identify the foods you eat that contain carbohydrates. Then, using the Foods with Carbohydrates chart, determine about how much carbohydrates are in your meals and snacks. Make sure you are eating foods  with fiber, protein, and healthy fat along with your carbohydrate foods. Foods with Carbohydrates The following table shows carbohydrate foods that have about 15 grams of carbohydrate each. Using measuring cups, spoons, or a food scale when you first begin learning about carbohydrate counting can help you learn about the portion sizes you typically eat. The following foods have 15 grams carbohydrate each:  Grains 1 slice bread (1 ounce)  1 small tortilla (6-inch size)   large bagel (1 ounce)  1/3 cup pasta or rice (cooked)   hamburger or hot dog bun ( ounce)   cup cooked cereal   to  cup ready-to-eat cereal  2 taco shells (5-inch size) Fruit 1 small fresh fruit ( to 1 cup)   medium banana  17 small grapes (  3 ounces)  1 cup melon or berries   cup canned or frozen fruit  2 tablespoons dried fruit (blueberries, cherries, cranberries, raisins)   cup unsweetened fruit juice  Starchy Vegetables  cup cooked beans, peas, corn, potatoes/sweet potatoes   large baked potato (3 ounces)  1 cup acorn or butternut squash  Snack Foods 3 to 6 crackers  8 potato chips or 13 tortilla chips ( ounce to 1 ounce)  3 cups popped popcorn  Dairy 3/4 cup (6 ounces) nonfat plain yogurt, or yogurt with sugar-free sweetener  1 cup milk  1 cup plain rice, soy, coconut or flavored almond milk Sweets and Desserts  cup ice cream or frozen yogurt  1 tablespoon jam, jelly, pancake syrup, table sugar, or honey  2 tablespoons light pancake syrup  1 inch square of frosted cake or 2 inch square of unfrosted cake  2 small cookies (2/3 ounce each) or  large cookie  Sometimes you'll have to estimate carbohydrate amounts if you don't know the exact recipe. One cup of mixed foods like soups can have 1 to 2 carbohydrate servings, while some casseroles might have 2 or more servings of carbohydrate. Foods that have less than 20 calories in each serving can be counted as "free" foods. Count 1 cup raw vegetables,  or  cup cooked non-starchy vegetables as "free" foods. If you eat 3 or more servings at one meal, then count them as 1 carbohydrate serving.  Foods without Carbohydrates  Not all foods contain carbohydrates. Meat, some dairy, fats, non-starchy vegetables, and many beverages don't contain carbohydrate. So when you count carbohydrates, you can generally exclude chicken, pork, beef, fish, seafood, eggs, tofu, cheese, butter, sour cream, avocado, nuts, seeds, olives, mayonnaise, water, black coffee, unsweetened tea, and zero-calorie drinks. Vegetables with no or low carbohydrate include green beans, cauliflower, tomatoes, and onions. How much carbohydrate should I eat at each meal?  Carbohydrate counting can help you plan your meals and manage your weight. Following are some starting points for carbohydrate intake at each meal. Work with your registered dietitian nutritionist to find the best range that works for your blood glucose and weight.   To Lose Weight To Maintain Weight  Women 2 - 3 carb servings 3 - 4 carb servings  Men 3 - 4 carb servings 4 - 5 carb servings  Checking your blood glucose after meals will help you know if you need to adjust the timing, type, or number of carbohydrate servings in your meal plan. Achieve and keep a healthy body weight by balancing your food intake and physical activity.  Tips How should I plan my meals?  Plan for half the food on your plate to include non-starchy vegetables, like salad greens, broccoli, or carrots. Try to eat 3 to 5 servings of non-starchy vegetables every day. Have a protein food at each meal. Protein foods include chicken, fish, meat, eggs, or beans (note that beans contain carbohydrate). These two food groups (non-starchy vegetables and proteins) are low in carbohydrate. If you fill up your plate with these foods, you will eat less carbohydrate but still fill up your stomach. Try to limit your carbohydrate portion to  of the plate.  What fats  are healthiest to eat?  Diabetes increases risk for heart disease. To help protect your heart, eat more healthy fats, such as olive oil, nuts, and avocado. Eat less saturated fats like butter, cream, and high-fat meats, like bacon and sausage. Avoid trans fats, which are in all foods  that list "partially hydrogenated oil" as an ingredient. What should I drink?  Choose drinks that are not sweetened with sugar. The healthiest choices are water, carbonated or seltzer waters, and tea and coffee without added sugars.  Sweet drinks will make your blood glucose go up very quickly. One serving of soda or energy drink is  cup. It is best to drink these beverages only if your blood glucose is low.  Artificially sweetened, or diet drinks, typically do not increase your blood glucose if they have zero calories in them. Read labels of beverages, as some diet drinks do have carbohydrate and will raise your blood glucose. Label Reading Tips Read Nutrition Facts labels to find out how many grams of carbohydrate are in a food you want to eat. Don't forget: sometimes serving sizes on the label aren't the same as how much food you are going to eat, so you may need to calculate how much carbohydrate is in the food you are serving yourself.   Carbohydrate Counting for People with Diabetes Sample 1-Day Menu  Breakfast  cup yogurt, low fat, low sugar (1 carbohydrate serving)   cup cereal, ready-to-eat, unsweetened (1 carbohydrate serving)  1 cup strawberries (1 carbohydrate serving)   cup almonds ( carbohydrate serving)  Lunch 1, 5 ounce can chunk light tuna  2 ounces cheese, low fat cheddar  6 whole wheat crackers (1 carbohydrate serving)  1 small apple (1 carbohydrate servings)   cup carrots ( carbohydrate serving)   cup snap peas  1 cup 1% milk (1 carbohydrate serving)   Evening Meal Stir fry made with: 3 ounces chicken  1 cup brown rice (3 carbohydrate servings)   cup broccoli ( carbohydrate serving)    cup green beans   cup onions  1 tablespoon olive oil  2 tablespoons teriyaki sauce ( carbohydrate serving)  Evening Snack 1 extra small banana (1 carbohydrate serving)  1 tablespoon peanut butter   Carbohydrate Counting for People with Diabetes Vegan Sample 1-Day Menu  Breakfast 1 cup cooked oatmeal (2 carbohydrate servings)   cup blueberries (1 carbohydrate serving)  2 tablespoons flaxseeds  1 cup soymilk fortified with calcium and vitamin D  1 cup coffee  Lunch 2 slices whole wheat bread (2 carbohydrate servings)   cup baked tofu   cup lettuce  2 slices tomato  2 slices avocado   cup baby carrots ( carbohydrate serving)  1 orange (1 carbohydrate serving)  1 cup soymilk fortified with calcium and vitamin D   Evening Meal Burrito made with: 1 6-inch corn tortilla (1 carbohydrate serving)  1 cup refried vegetarian beans (2 carbohydrate servings)   cup chopped tomatoes   cup lettuce   cup salsa  1/3 cup brown rice (1 carbohydrate serving)  1 tablespoon olive oil for rice   cup zucchini   Evening Snack 6 small whole grain crackers (1 carbohydrate serving)  2 apricots ( carbohydrate serving)   cup unsalted peanuts ( carbohydrate serving)    Carbohydrate Counting for People with Diabetes Vegetarian (Lacto-Ovo) Sample 1-Day Menu  Breakfast 1 cup cooked oatmeal (2 carbohydrate servings)   cup blueberries (1 carbohydrate serving)  2 tablespoons flaxseeds  1 egg  1 cup 1% milk (1 carbohydrate serving)  1 cup coffee  Lunch 2 slices whole wheat bread (2 carbohydrate servings)  2 ounces low-fat cheese   cup lettuce  2 slices tomato  2 slices avocado   cup baby carrots ( carbohydrate serving)  1 orange (1 carbohydrate serving)  1 cup unsweetened tea  Evening Meal Burrito made with: 1 6-inch corn tortilla (1 carbohydrate serving)   cup refried vegetarian beans (1 carbohydrate serving)   cup tomatoes   cup lettuce   cup salsa  1/3 cup brown rice (1  carbohydrate serving)  1 tablespoon olive oil for rice   cup zucchini  1 cup 1% milk (1 carbohydrate serving)  Evening Snack 6 small whole grain crackers (1 carbohydrate serving)  2 apricots ( carbohydrate serving)   cup unsalted peanuts ( carbohydrate serving)    Copyright 2020  [employer]y [employer]. All rights reserved.  Using Nutrition Labels: Carbohydrate  Serving Size  Look at the serving size. All the information on the label is based on this portion. Servings Per Container  The number of servings contained in the package. Guidelines for Carbohydrate  Look at the total grams of carbohydrate in the serving size.  1 carbohydrate choice = 15 grams of carbohydrate. Range of Carbohydrate Grams Per Choice  Carbohydrate Grams/Choice Carbohydrate Choices  6-10   11-20 1  21-25 1  26-35 2  36-40 2  41-50 3  51-55 3  56-65 4  66-70 4  71-80 5    Copyright 2020  [employer]y of [employer]. All rights reserved.

## 2020-11-23 NOTE — Evaluation (Signed)
Physical Therapy Evaluation Patient Details Name: Jennifer [name] MRN: [id number] DOB: [birth year] Today's Date: 11/23/2020   History of Present Illness  Pt is a 50 y/o female admitted secondary to dizziness, orthostatic hypotension and uncontrolled diabetes. PMH including but not limited to type 2 diabetes, HFpEF, atrial fibrillation, hypotension, early satiety, lower extremity weakness.   Clinical Impression  Pt presented supine in bed with HOB elevated, awake and willing to participate in therapy session. Pt's daughter was present throughout session and preferring to translate for her mother. Prior to admission, pt utilized a RW to ambulate and was independent with ADLs. She does not drive. Pt lives with her family in a single level home with a level entrance and has 24/7 supervision. At the time of evaluation, pt overall at a supervision to min guard level with functional mobility with use of a RW. She ambulated a short distance in her room and was limited secondary to fatigue. Her daughter reported that her gait pattern/abnormalities (described below) were pt's baseline. Discussed recommendations for OP PT services upon d/c with pt's daughter for overall strengthening and balance, which she agreed with. PT will continue to follow pt acutely to progress mobility as tolerated.     Follow Up Recommendations Outpatient PT    Equipment Recommendations  None recommended by PT    Recommendations for Other Services       Precautions / Restrictions Precautions Precautions: Other (comment) Precaution Comments: monitor BP/orthostatics Restrictions Weight Bearing Restrictions: No      Mobility  Bed Mobility Overal bed mobility: Needs Assistance Bed Mobility: Supine to Sit;Sit to Supine Rolling: Min assist   Supine to sit: Supervision Sit to supine: Supervision   General bed mobility comments: no physical assistance needed    Transfers Overall transfer level: Needs assistance Equipment  used: Rolling walker (2 wheeled) Transfers: Sit to/from Stand Sit to Stand: Supervision         General transfer comment: good technique utilized, steady overall with transitional movement  Ambulation/Gait Ambulation/Gait assistance: Min guard Gait Distance (Feet): 20 Feet Assistive device: Rolling walker (2 wheeled) Gait Pattern/deviations: Step-to pattern;Decreased step length - left;Decreased stance time - right;Decreased stride length Gait velocity: decreased   General Gait Details: pt with a tendency to maintain her R LE posteriorly and slides/drags it along with it positioned in external rotation (dtr reporting this is her baseline gait pattern); pt with mild instability but no overt LOB or need for physical assistance  Stairs            Wheelchair Mobility    Modified Rankin (Stroke Patients Only)       Balance Overall balance assessment: Needs assistance Sitting-balance support: Feet supported Sitting balance-Leahy Scale: Good     Standing balance support: During functional activity;Single extremity supported;Bilateral upper extremity supported Standing balance-Leahy Scale: Poor                               Pertinent Vitals/Pain Pain Assessment: No/denies pain    Home Living Family/patient expects to be discharged to:: Private residence Living Arrangements: Spouse/significant other;Children Available Help at Discharge: Family;Available PRN/intermittently Type of Home: Apartment Home Access: Level entry     Home Layout: One level Home Equipment: Cane - single point Additional Comments: acces to RW, and chair for tub (a normal black chair per daughter)    Prior Function Level of Independence: Independent with assistive device(s)         Comments: ambulates  with a RW; does not drive; independent with ADLs     Hand Dominance   Dominant Hand: Right    Extremity/Trunk Assessment   Upper Extremity Assessment Upper Extremity  Assessment: Defer to OT evaluation;Generalized weakness    Lower Extremity Assessment Lower Extremity Assessment: Generalized weakness    Cervical / Trunk Assessment Cervical / Trunk Assessment: Normal  Communication   Communication: Prefers language other than English  Cognition Arousal/Alertness: Awake/alert Behavior During Therapy: WFL for tasks assessed/performed Overall Cognitive Status: Within Functional Limits for tasks assessed                                 General Comments: appears WFL, daughter prefers to communicate for language barrier despite offering contacting interpreter. would benefit form formal interpreter to determine any cognitive deficits/barriers at next session      General Comments General comments (skin integrity, edema, etc.): noted pt with soft BP and elevated HR during assessment deferred OOB at this time for safety    Exercises     Assessment/Plan    PT Assessment Patient needs continued PT services  PT Problem List Decreased activity tolerance;Decreased balance;Decreased mobility;Decreased coordination;Decreased knowledge of use of DME;Decreased safety awareness;Decreased knowledge of precautions       PT Treatment Interventions DME instruction;Gait training;Stair training;Functional mobility training;Therapeutic activities;Therapeutic exercise;Balance training;Neuromuscular re-education;Patient/family education    PT Goals (Current goals can be found in the Care Plan section)  Acute Rehab PT Goals Patient Stated Goal: to feel better PT Goal Formulation: With patient/family Time For Goal Achievement: 12/07/20 Potential to Achieve Goals: Good    Frequency Min 3X/week   Barriers to discharge        Co-evaluation               AM-PAC PT "6 Clicks" Mobility  Outcome Measure Help needed turning from your back to your side while in a flat bed without using bedrails?: None Help needed moving from lying on your back to  sitting on the side of a flat bed without using bedrails?: None Help needed moving to and from a bed to a chair (including a wheelchair)?: None Help needed standing up from a chair using your arms (e.g., wheelchair or bedside chair)?: A Little Help needed to walk in hospital room?: A Little Help needed climbing 3-5 steps with a railing? : A Little 6 Click Score: 21    End of Session   Activity Tolerance: Patient limited by fatigue Patient left: in bed;with call bell/phone within reach;with family/visitor present Nurse Communication: Mobility status PT Visit Diagnosis: Other abnormalities of gait and mobility (R26.89)    Time: [id number] PT Time Calculation (min) (ACUTE ONLY): 14 min   Charges:   PT Evaluation $PT Eval Low Complexity: 1 Low          Eduard Clos, PT, DPT  Acute Rehabilitation Services Office [place] 11/23/2020, 12:21 PM

## 2020-11-23 NOTE — Telephone Encounter (Signed)
Rx for Janumet 50-1000 sent yesterday by Intern. Patient currently admitted. Will need Rx sent by upper Resident or Attending at d/c.

## 2020-11-23 NOTE — Progress Notes (Addendum)
   11/23/20 0446  Assess: MEWS Score  Temp 98.3 F (36.8 C)  BP (!) 80/49  Pulse Rate 100  Resp 18  Level of Consciousness Alert  Assess: MEWS Score  MEWS Temp 0  MEWS Systolic 2  MEWS Pulse 0  MEWS RR 0  MEWS LOC 0  MEWS Score 2  MEWS Score Color Yellow  Assess: if the MEWS score is Yellow or Red  Were vital signs taken at a resting state? Yes  Focused Assessment No change from prior assessment  Early Detection of Sepsis Score *See Row Information* Low  MEWS guidelines implemented *See Row Information* No, other (Comment)  Treat  Pain Scale 0-10  Pain Score 0  Take Vital Signs  Increase Vital Sign Frequency  Yellow: Q 2hr X 2 then Q 4hr X 2, if remains yellow, continue Q 4hrs  Escalate  MEWS: Escalate Yellow: discuss with charge nurse/RN and consider discussing with provider and RRT  Notify: Charge Nurse/RN  Name of Charge Nurse/RN Notified Joyce,RN  Date Charge Nurse/RN Notified 11/23/20  Time Charge Nurse/RN Notified [id number]  Notify: Provider  Provider Name/Title Mellody Life  Date Provider Notified 11/23/20  Time Provider Notified [phone]  Notification Type Page  Notification Reason Critical result  Provider response Evaluate remotely  Date of Provider Response 11/23/20  Time of Provider Response 0457  Document  Patient Outcome Other (Comment)  Progress note created (see row info) Yes   Pt's vitals rechecked. Jessica Liang,MD notified. RN will continue to monitor pt.    11/23/20 0446 11/23/20 0457  Assess: MEWS Score  Temp 98.3 F (36.8 C) 98.3 F (36.8 C)  BP (!) 80/49 104/68  Pulse Rate 100 70  Resp 18 18  Level of Consciousness Alert Alert  Assess: MEWS Score  MEWS Temp 0 0  MEWS Systolic 2 0  MEWS Pulse 0 0  MEWS RR 0 0  MEWS LOC 0 0  MEWS Score 2 0  MEWS Score Color Yellow Green

## 2020-11-23 NOTE — Plan of Care (Signed)
  Problem: Clinical Measurements: Goal: Will remain free from infection Outcome: Progressing Goal: Diagnostic test results will improve Outcome: Progressing   Problem: Activity: Goal: Risk for activity intolerance will decrease Outcome: Progressing   Problem: Nutrition: Goal: Adequate nutrition will be maintained Outcome: Progressing

## 2020-11-23 NOTE — Progress Notes (Signed)
OT worked with pt earlier and BP was low just lying. Tech took new set of vitals and BP still very low. MD notified. New orders placed for bolus of LR. Will continue to monitor. RED Mews guidelines in place.   11/23/20 1100  Assess: MEWS Score  Temp 98.1 F (36.7 C)  BP (!) 70/52 (RN notified)  Pulse Rate (!) 138 (RN notified)  SpO2 100 %  O2 Device Room Air  Assess: MEWS Score  MEWS Temp 0  MEWS Systolic 2  MEWS Pulse 3  MEWS RR 0  MEWS LOC 0  MEWS Score 5  MEWS Score Color Red  Assess: if the MEWS score is Yellow or Red  Were vital signs taken at a resting state? Yes  Focused Assessment No change from prior assessment  Early Detection of Sepsis Score *See Row Information* Low  MEWS guidelines implemented *See Row Information* Yes  Treat  MEWS Interventions Escalated (See documentation below)  Pain Scale 0-10  Pain Score 0  Take Vital Signs  Increase Vital Sign Frequency  Red: Q 1hr X 4 then Q 4hr X 4, if remains red, continue Q 4hrs  Escalate  MEWS: Escalate Red: discuss with charge nurse/RN and provider, consider discussing with RRT  Notify: Charge Nurse/RN  Name of Charge Nurse/RN Notified [place], RN  Date Charge Nurse/RN Notified 11/23/20  Time Charge Nurse/RN Notified 1105  Notify: Provider  Provider Name/Title Posey Pronto, MD  Date Provider Notified 11/23/20  Time Provider Notified 1130  Notification Type Page  Notification Reason Change in status  Provider response See new orders (bolus of fluids)  Date of Provider Response 11/23/20  Time of Provider Response 1134  Document  Patient Outcome Other (Comment)  Progress note created (see row info) Yes

## 2020-11-23 NOTE — Hospital Course (Addendum)
Jennifer [name] is a 50 y.o. with a pertinent PMH of poorly uncontrolled diabetes, peripheral neuropathy, atrial fibrillation, and HFpEF, who presented directly from the [facility] for orthostatic hypotension.  #Orthostatic hypotension #Autonomic dysfunction Patient has had multiple episodes of hypotension that have been worked up several times and have all been attributed to autonomic dysfunction and poor oral intake. Initial orthostatics prior to admission were: lying 141/78, sitting 114/67, and standing 84/49. Patient received 1L LR bolus. Had episode of dizziness in the afternoon on 8/4 and BP dropped to 70s/40s while standing and it increased to 110/70 when laying back down. More IV fluids were ordered, LR 100 cc/hr x10 hour.  ACTH stimstimulation test performed on 8/5 and patient had appropriate rises in her cortisol levels, thus adrenal insufficiency is ruled out. Patient has no other signs/symptoms of neurodegenerative disorders such as Parkinson's or Lewy Body dementia, thus this is likely not the culprit of her orthostasis. Likely not to be autonomic dysfunction secondary to impairment of ganglionic nicotinic acetylcholine receptors as patient does not have any other sxs such as neurogenic bladder, dry eye, or dry mouth. Paraneoplastic syndrome also not likely as no malignancy has been noted on imaging. Familial dysautonomia not likely the cause as this would have been expressed at birth with impaired pain and temperature sensation. Volume depletion was suspected initially and patient responded well to fluids, however, she is euvolemic and remains orthostatic. Patient is also not on any medications that could likely cause autonomic dysfunction. Diabetes mellitis is the most common cause of autonomic neuropathy and is likely the cause of orthostatic hypotension in this patient, as her diabetes is not well controlled, with A1c of 13.5%. Patient remained orthostatic on 8/5 and was bolused with another 1L of LR.  Patient was again orthostatic on 8/6, however, fluids were not given as the patient appears euvolemic and is starting to develop some pitting edema in her feet. Midodrine started on 8/7 at 2.'5mg'$  BID as the patient remains orthostatic and symptomatic. Had one episode of hypertension after starting midodrine, however, the patient remained asymptomatic. Likely will need to tolerate higher blood pressures while seated. Patient denied any dizziness/lightheadedness while ambulating and states that she feels better as of 8/9. On the day of discharge she stated that she was able to walk around more without having any lightheadedness. Midodrine was increased to '5mg'$  BID as she remained orthostatic.    #Atrial fibrillation with RVR Patient has a known history of atrial fibrillation and had an irregular, tachycardic rate and irregular rhythm upon auscultation. She has not been taking home anticoagulants. Started on Eliquis 5 mg BID during admission and should continue on this upon discharge.    #Type 2 diabetes mellitus with diabetic neuropathy Patient endorses adherence to her diabetic management including NovoLOG 70-30 30 units in the morning and 15 units in the evening, as well as metformin 1,000 mg twice daily. HbA1c was 13.5% as of 8/3. Was supposed to start on Janumet as of yesterday. Will continue her on metformin, linagliptin, Levemir, and SSI. Blood sugars have been ranging from 150s-180s. Continued Linagliptin 5 mg daily, metformin 1,000 mg twice daily, and increased Levemir to 15 units nightly on 8/5 and novoLOG SS injection three times daily with meals. Will be discharged on metformin '1000mg'$ , linagliptin '5mg'$ , and insulin determir 15U nightly, insulin aspart 3U TID with meals. Advised to follow up with diabetes coordinator at the internal medicine clinic.    #Chronic HFpEF Diagnosed via TTE October 2021, EF 50-55%. Patient appears  clinically hypovolemic secondary to poor oral intake. Received LR 1,000 mL  bolus and monitored for any signs of volume overload.   #CKD stage 3a Initial labs showed BUN/Cr 27/1.18 and GFR 57. Kidney function remains stable after receiving IV fluids.    #Early satiety Likely diagnosis of gastroparesis likely a result of poorly controlled diabetes although a gastric emptying study has never been performed. She has been prescribed Reglan in the past, however has not been taking it. Received Reglan 5 mg TID   #Lower extremity weakness Patient has a history of diabetic neuropathy and a recent surgery on her right ankle after a closed fracture. She reports continued weakness and pain in the ankle and states that it feels heavy. She had decreased strength in her right lower extremity as compared to the left. Vitamin B12 level 254 and received 1000 mcg B12 injection. PT/OT evaluated and treated the patient and both recommended SNF upon discharge, however, she improved during the course of her admission and outpatient physical therapy was thus recommended.

## 2020-11-23 NOTE — Progress Notes (Signed)
Pt refused orthostatic vitals. Pt educated.

## 2020-11-23 NOTE — Plan of Care (Signed)
°  Problem: Clinical Measurements: °Goal: Diagnostic test results will improve °Outcome: Progressing °  °Problem: Activity: °Goal: Risk for activity intolerance will decrease °Outcome: Progressing °  °Problem: Nutrition: °Goal: Adequate nutrition will be maintained °Outcome: Progressing °  °Problem: Elimination: °Goal: Will not experience complications related to bowel motility °Outcome: Progressing °  °

## 2020-11-24 ENCOUNTER — Other Ambulatory Visit (HOSPITAL_COMMUNITY): Payer: Self-pay

## 2020-11-24 ENCOUNTER — Inpatient Hospital Stay (HOSPITAL_COMMUNITY): Payer: Medicaid Other

## 2020-11-24 DIAGNOSIS — I951 Orthostatic hypotension: Secondary | ICD-10-CM | POA: Diagnosis not present

## 2020-11-24 DIAGNOSIS — R609 Edema, unspecified: Secondary | ICD-10-CM

## 2020-11-24 LAB — GLUCOSE, CAPILLARY
Glucose-Capillary: 165 mg/dL — ABNORMAL HIGH (ref 70–99)
Glucose-Capillary: 297 mg/dL — ABNORMAL HIGH (ref 70–99)
Glucose-Capillary: 151 mg/dL — ABNORMAL HIGH (ref 70–99)
Glucose-Capillary: 125 mg/dL — ABNORMAL HIGH (ref 70–99)

## 2020-11-24 LAB — RENAL FUNCTION PANEL
Albumin: 2.3 g/dL — ABNORMAL LOW (ref 3.5–5.0)
Anion gap: 6 (ref 5–15)
BUN: 31 mg/dL — ABNORMAL HIGH (ref 6–20)
CO2: 26 mmol/L (ref 22–32)
Calcium: 9 mg/dL (ref 8.9–10.3)
Chloride: 104 mmol/L (ref 98–111)
Creatinine, Ser: 1.25 mg/dL — ABNORMAL HIGH (ref 0.44–1.00)
GFR, Estimated: 53 mL/min — ABNORMAL LOW (ref >60)
Glucose, Bld: 197 mg/dL — ABNORMAL HIGH (ref 70–99)
Phosphorus: 4.2 mg/dL (ref 2.5–4.6)
Potassium: 4.3 mmol/L (ref 3.5–5.1)
Sodium: 136 mmol/L (ref 135–145)

## 2020-11-24 LAB — ACTH STIMULATION, 3 TIME POINTS
Cortisol, 30 Min: 22.6 ug/dL
Cortisol, 60 Min: 30.7 ug/dL
Cortisol, Base: 3.3 ug/dL

## 2020-11-24 MED ORDER — CARVEDILOL 3.125 MG PO TABS: 3.1250 mg | ORAL_TABLET | Freq: Two times a day (BID) | ORAL | Status: DC

## 2020-11-24 MED ORDER — POLYVINYL ALCOHOL 1.4 % OP SOLN
1.0000 [drp] | OPHTHALMIC | Status: DC | PRN
  Administered 2020-11-24 – 2020-11-25 (×2): 1 [drp] via OPHTHALMIC
  Filled 2020-11-24 (×2): qty 15

## 2020-11-24 MED ORDER — LACTATED RINGERS IV BOLUS
1000.0000 mL | Freq: Once | INTRAVENOUS | Status: AC
  Administered 2020-11-24: 1000 mL via INTRAVENOUS

## 2020-11-24 MED ORDER — INSULIN DETEMIR 100 UNIT/ML ~~LOC~~ SOLN
15.0000 [IU] | Freq: Every day | SUBCUTANEOUS | Status: DC
  Administered 2020-11-24 – 2020-11-28 (×5): 15 [IU] via SUBCUTANEOUS
  Filled 2020-11-24 (×6): qty 0.15

## 2020-11-24 NOTE — Progress Notes (Signed)
HD#2 SUBJECTIVE:  Patient Summary: Jennifer [name] is a 50 y.o. with a pertinent PMH of poorly uncontrolled diabetes, peripheral neuropathy, atrial fibrillation, and HFpEF, who presented directly from the [facility] for orthostatic hypotension.  Overnight Events: No acute events overnight reported.  Interim History: This is hospital day 2 for Ms. Krysiak who was seen and evaluated at the bedside this morning. Her daughter served as Engineer, production again for [place]. Ms. Lyon states that she feels better, but she does still endorse dizziness when attempting to stand/walk. She has been able to eat some food, with an improvement in her appetite compared to yesterday.    OBJECTIVE:  Vital Signs: Vitals:   11/23/20 1900 11/23/20 2242 11/24/20 0300 11/24/20 0729  BP: (!) 86/50 115/69 128/62 (!) 149/73  Pulse: 89 87 87 89  Resp: '15 16 16 17  '$ Temp: 98.9 F (37.2 C) 98.4 F (36.9 C) 98.7 F (37.1 C) 98.5 F (36.9 C)  TempSrc: Oral Oral Oral Oral  SpO2: 100%  99% 100%  Weight:      Height:       Supplemental O2: Room Air SpO2: 100 %  Filed Weights   11/23/20 1500  Weight: 71.2 kg     Intake/Output Summary (Last 24 hours) at 11/24/2020 0751 Last data filed at 11/24/2020 [id number] Gross per 24 hour  Intake 697.43 ml  Output 300 ml  Net 397.43 ml   Net IO Since Admission: 637.43 mL [11/24/20 0751]  Physical Exam: General: Pleasant, comfortable-appearing female laying in bed. No acute distress CV: Tachycardic rate and regular rhythm. No murmurs, rubs, or gallops. 1+ bilateral lower extremity edema Pulmonary: Lungs CTAB. Normal effort. No wheezing or rales. Abdominal: Soft, nontender, nondistended. Normal bowel sounds. Extremities: Palpable radial and DP pulses. Normal ROM. Scar on R ankle s/p ORIF. Right calf is ~4cm larger in size compared to left, no tenderness to palpation and no erythema noted. Skin: Warm and dry. No obvious rash or lesions. Neuro: A&Ox3. Moves all extremities but has  slightly reduced strength in right lower extremity, about 3/5 compared to 4/5 on the left. Normal sensation. Psych: Normal mood and affect  Patient Lines/Drains/Airways Status     Active Line/Drains/Airways     Name Placement date Placement time Site Days   Peripheral IV 11/22/20 20 G 1.88" Right;Lateral Forearm 11/22/20  1621  Forearm  2   Incision (Closed) 05/05/20 Ankle Right 05/05/20  1051  -- 203            Pertinent Labs: CBC Latest Ref Rng & Units 11/23/2020 11/22/2020 11/07/2020  WBC 4.0 - 10.5 K/uL 6.9 5.5 4.3  Hemoglobin 12.0 - 15.0 g/dL 12.1 12.2 12.2  Hematocrit 36.0 - 46.0 % 36.1 37.7 37.5  Platelets 150 - 400 K/uL 155 180 160    CMP Latest Ref Rng & Units 11/24/2020 11/23/2020 11/22/2020  Glucose 70 - 99 mg/dL 197(H) 161(H) 69(L)  BUN 6 - 20 mg/dL 31(H) 24(H) 26(H)  Creatinine 0.44 - 1.00 mg/dL 1.25(H) 1.11(H) 1.00  Sodium 135 - 145 mmol/L 136 134(L) 133(L)  Potassium 3.5 - 5.1 mmol/L 4.3 4.6 3.6  Chloride 98 - 111 mmol/L 104 104 101  CO2 22 - 32 mmol/L '26 25 23  '$ Calcium 8.9 - 10.3 mg/dL 9.0 9.3 9.3  Total Protein 6.5 - 8.1 g/dL - - 6.4(L)  Total Bilirubin 0.3 - 1.2 mg/dL - - 0.6  Alkaline Phos 38 - 126 U/L - - 62  AST 15 - 41 U/L - - 18  ALT 0 - 44 U/L - - 12    Recent Labs    11/23/20 1707 11/23/20 2123 11/24/20 0642  GLUCAP 205* 218* 165*     Pertinent Imaging: No results found.  ASSESSMENT/PLAN:  Assessment: Principal Problem:   Orthostatic hypotension Active Problems:   Type II diabetes mellitus (HCC)   (HFpEF) heart failure with preserved ejection fraction (HCC)   Atrial fibrillation (HCC)   Early satiety   Lower extremity weakness   CKD (chronic kidney disease) stage 3, GFR 30-59 ml/min (HCC)   Plan: #Orthostatic hypotension Patient has had multiple episodes of hypotension that have been worked up several times and have all been attributed to autonomic dysfunction and poor oral intake. Had an episode of dizziness yesterday and BP dropped to  70s/40s and she went into Afib. ACTH stimulation test this morning performed and patient had appropriate rises in her cortisol levels, thus adrenal insufficiency is ruled out.  - Orthostatics pending for today - Can give more IV fluids if patient remains orthostatic  #Atrial fibrillation with RVR Patient has a known history of atrial fibrillation and went into Afib while obtaining orthostatic BP measurements yesterday. She is back in normal sinus rhythm today. She has not been taking home anticoagulants and was noted to have right calf enlargement as compared to the left. Right calf is non tender and non erythematous. - Continue Eliquis 5 mg BID - Will start metoprolol if patient is not orthostatic - [place] right lower extremity pending    #Type 2 diabetes mellitus with diabetic neuropathy Patient endorses adherence to her diabetic management including NovoLOG 70-30 30 units in the morning and 15 units in the evening, as well as metformin 1,000 mg twice daily. HbA1c 13.5% as of 8/3. Will continue her on metformin, linagliptin, Levemir, and SSI. Blood sugars have been ranging from 160s-210s in the last day. Patient and her family are requesting a diabetics education consultation.  - Continue Linagliptin 5 mg daily - Continue metformin 1,000 mg twice daliy - Increased Levemir to 15 units nightly and novoLOG SS injection three times daily with meals - Will need to optimize diabetes regimen upon discharge, likely with long acting insulin as opposed to 70-30 insulin   #HFpEF Diagnosed via TTE October 2021, EF 50-55%. Patient appears clinically euvolemic at this time. Has been receiving fluids as she has been orthostatic. Will continue to monitor for any signs of volume overload.   #CKD stage 3a Initial labs showed BUN/Cr 27/1.18 and GFR 57. Cr 1.25 today. - Continue to monitor renal function   #Early satiety Likely diagnosis of gastroparesis as a result of poorly controlled diabetes although a  gastric emptying study has never been performed. She has been prescribed Reglan in the past, however has not been taking it. - Continue Reglan 5 mg TID - Continue to monitor oral intake   #Lower extremity weakness Patient has a history of diabetic neuropathy and a recent ORIF on her right ankle after a fracture. She reports continued weakness and pain in the ankle and states that it feels heavy. She had decreased strength in her right lower extremity as compared to the left. Vitamin B12 level 254 and received 106mg B12 injection. - Thiamine still pending  - PT/OT to eval and treat  Best Practice: Diet: carb modified diet IVF: none VTE: enoxaparin (LOVENOX) injection 40 mg Start: 11/22/20 2115 Code: Full Therapy Recs: PT/OT recommending outpatient PT vs OT recommending SNF Family Contact: daughter, at bedside. DISPO: Anticipated discharge Home pending  clinical improvement .  Signature: Buddy Duty, D.O.  Internal Medicine Resident, PGY-1 [facility]  Pager: [phone] 7:51 AM, 11/24/2020   Please contact the on call pager after 5 pm and on weekends at [phone].

## 2020-11-24 NOTE — Progress Notes (Signed)
Right lower extremity venous duplex completed. Refer to "CV Proc" under chart review to view preliminary results.  11/24/2020 12:08 PM Kelby Aline., MHA, RVT, RDCS, RDMS

## 2020-11-24 NOTE — TOC Benefit Eligibility Note (Signed)
Patient Teacher, English as a foreign language completed.    The patient is currently admitted and upon discharge could be taking Eliquis 5 mg.  The current 30 day co-pay is, $4.00.   The patient is insured through [place], [place] Patient Advocate Specialist [facility] [facility] Team Direct Number: [phone]  Fax: [phone]

## 2020-11-25 LAB — GLUCOSE, CAPILLARY
Glucose-Capillary: 167 mg/dL — ABNORMAL HIGH (ref 70–99)
Glucose-Capillary: 176 mg/dL — ABNORMAL HIGH (ref 70–99)
Glucose-Capillary: 100 mg/dL — ABNORMAL HIGH (ref 70–99)

## 2020-11-25 LAB — BASIC METABOLIC PANEL
Anion gap: 9 (ref 5–15)
BUN: 29 mg/dL — ABNORMAL HIGH (ref 6–20)
CO2: 24 mmol/L (ref 22–32)
Calcium: 9.2 mg/dL (ref 8.9–10.3)
Chloride: 104 mmol/L (ref 98–111)
Creatinine, Ser: 1.07 mg/dL — ABNORMAL HIGH (ref 0.44–1.00)
GFR, Estimated: >60 mL/min (ref >60)
Glucose, Bld: 172 mg/dL — ABNORMAL HIGH (ref 70–99)
Potassium: 4.2 mmol/L (ref 3.5–5.1)
Sodium: 137 mmol/L (ref 135–145)

## 2020-11-25 LAB — VITAMIN B1: Vitamin B1 (Thiamine): 118.4 nmol/L (ref 66.5–200.0)

## 2020-11-25 LAB — TSH: TSH: 3.115 u[IU]/mL (ref 0.350–4.500)

## 2020-11-25 MED ORDER — DORZOLAMIDE HCL-TIMOLOL MAL 2-0.5 % OP SOLN
1.0000 [drp] | Freq: Two times a day (BID) | OPHTHALMIC | Status: DC
  Administered 2020-11-25 – 2020-11-29 (×9): 1 [drp] via OPHTHALMIC
  Filled 2020-11-25: qty 10

## 2020-11-25 MED ORDER — BRIMONIDINE TARTRATE 0.2 % OP SOLN
1.0000 [drp] | Freq: Three times a day (TID) | OPHTHALMIC | Status: DC
  Administered 2020-11-25 – 2020-11-29 (×12): 1 [drp] via OPHTHALMIC
  Filled 2020-11-25: qty 5

## 2020-11-25 NOTE — Progress Notes (Signed)
Physical Therapy Treatment Patient Details Name: Jennifer [name] MRN: [id number] DOB: Sep 09, 1970 Today's Date: 11/25/2020    History of Present Illness Pt is a 50 y/o female admitted secondary to dizziness, orthostatic hypotension and uncontrolled diabetes. PMH including but not limited to type 2 diabetes, HFpEF, atrial fibrillation, hypotension, early satiety, lower extremity weakness.    PT Comments    Session limited by + orthostatic hypotension, see below. Patient overall supervision level for safety. Unsteady upon standing and reports of dizziness during BP reading. Daughter and patient prefer SNF placement at discharge prior to returning home. Recommend SNF at discharge to maximize functional independence and endurance.   Orthostatic BPs  Supine 126/69 HR 82  Sitting 111/66 HR 84  Sitting after 3 min 136/68 HR 83  Standing 85/62 HR 86  Sitting 105/63 HR 84  Sitting after 3 min 111/62 HR 81  Standing  66/50 HR 88  Supine 128/71 HR 89      Follow Up Recommendations  SNF     Equipment Recommendations  Rolling Anabella Capshaw with 5" wheels    Recommendations for Other Services       Precautions / Restrictions Precautions Precautions: Other (comment) Precaution Comments: monitor BP/orthostatics Restrictions Weight Bearing Restrictions: No    Mobility  Bed Mobility Overal bed mobility: Needs Assistance Bed Mobility: Supine to Sit;Sit to Supine     Supine to sit: Supervision Sit to supine: Supervision   General bed mobility comments: supervision for safety, obtained orthostatic vitals    Transfers Overall transfer level: Needs assistance Equipment used: Rolling Kelty Szafran (2 wheeled) Transfers: Sit to/from Stand Sit to Stand: Supervision         General transfer comment: supervision for safety, obtained orthostatic vitals. Cues for hand placement  Ambulation/Gait             General Gait Details: deferred due to + orthostatics and symptomatic. See treatment  note for vitals   Stairs             Wheelchair Mobility    Modified Rankin (Stroke Patients Only)       Balance Overall balance assessment: Mild deficits observed, not formally tested                                          Cognition Arousal/Alertness: Awake/alert Behavior During Therapy: WFL for tasks assessed/performed Overall Cognitive Status: Within Functional Limits for tasks assessed                                 General Comments: daughter interpreting throughout session but patient able to understand some English      Exercises      General Comments        Pertinent Vitals/Pain Pain Assessment: No/denies pain    Home Living                      Prior Function            PT Goals (current goals can now be found in the care plan section) Acute Rehab PT Goals Patient Stated Goal: to feel better PT Goal Formulation: With patient/family Time For Goal Achievement: 12/07/20 Potential to Achieve Goals: Good Progress towards PT goals: Progressing toward goals    Frequency    Min 2X/week      PT Plan  Discharge plan needs to be updated;Frequency needs to be updated    Co-evaluation              AM-PAC PT "6 Clicks" Mobility   Outcome Measure  Help needed turning from your back to your side while in a flat bed without using bedrails?: A Little Help needed moving from lying on your back to sitting on the side of a flat bed without using bedrails?: A Little Help needed moving to and from a bed to a chair (including a wheelchair)?: A Little Help needed standing up from a chair using your arms (e.g., wheelchair or bedside chair)?: A Little Help needed to walk in hospital room?: A Little Help needed climbing 3-5 steps with a railing? : A Little 6 Click Score: 18    End of Session   Activity Tolerance: Treatment limited secondary to medical complications (Comment) (+ orthostatics) Patient left: in  bed;with call bell/phone within reach;with bed alarm set;with family/visitor present Nurse Communication: Mobility status PT Visit Diagnosis: Other abnormalities of gait and mobility (R26.89);Muscle weakness (generalized) (M62.81)     Time: [id number] PT Time Calculation (min) (ACUTE ONLY): 27 min  Charges:  $Therapeutic Activity: 23-37 mins                     Caylan Schifano A. Gilford Rile PT, DPT Acute Rehabilitation Services Pager [phone] Office [phone]    Linna Hoff 11/25/2020, 5:36 PM

## 2020-11-25 NOTE — Plan of Care (Signed)
  Problem: Education: Goal: Knowledge of General Education information will improve Description: Including pain rating scale, medication(s)/side effects and non-pharmacologic comfort measures Outcome: Progressing   Problem: Health Behavior/Discharge Planning: Goal: Ability to manage health-related needs will improve Outcome: Progressing   Problem: Clinical Measurements: Goal: Ability to maintain clinical measurements within normal limits will improve Outcome: Progressing Goal: Diagnostic test results will improve Outcome: Progressing Goal: Respiratory complications will improve Outcome: Progressing Goal: Cardiovascular complication will be avoided Outcome: Progressing   Problem: Activity: Goal: Risk for activity intolerance will decrease Outcome: Progressing   Problem: Nutrition: Goal: Adequate nutrition will be maintained Outcome: Progressing   Problem: Coping: Goal: Level of anxiety will decrease Outcome: Completed/Met   Problem: Elimination: Goal: Will not experience complications related to bowel motility Outcome: Progressing   Problem: Pain Managment: Goal: General experience of comfort will improve Outcome: Progressing   Problem: Safety: Goal: Ability to remain free from injury will improve Outcome: Progressing   Problem: Skin Integrity: Goal: Risk for impaired skin integrity will decrease Outcome: Progressing

## 2020-11-25 NOTE — Progress Notes (Addendum)
HD#3 SUBJECTIVE:  Patient Summary: Jennifer [name] is a 50 y.o. with a pertinent PMH of poorly uncontrolled diabetes, peripheral neuropathy, atrial fibrillation, and HFpEF, who presented directly from the [facility] for orthostatic hypotension.  Overnight Events: No acute events overnight.  Interim History: This is hospital day 3 for Jennifer [name] who was seen and evaluated at the bedside this morning. Reports that she feels better but continues to have bilateral feet "heaviness" and swelling. She said it feels like she has cuts on her feet. Patient continues to have some episodes of dizziness when standing/walking, however, it seems to be improving.   OBJECTIVE:  Vital Signs: Vitals:   11/24/20 1159 11/24/20 1337 11/24/20 2019 11/25/20 0548  BP: 120/68 (!) 124/57 139/81 120/62  Pulse: 89 94 (!) 101 88  Resp: '14 15 16   '$ Temp: 97.8 F (36.6 C) 98.1 F (36.7 C) 98.4 F (36.9 C) 98.4 F (36.9 C)  TempSrc:  Oral Oral Oral  SpO2: 100% 93% 100% 98%  Weight:      Height:       Supplemental O2: Room Air SpO2: 98 %  Filed Weights   11/23/20 1500  Weight: 71.2 kg     Intake/Output Summary (Last 24 hours) at 11/25/2020 0735 Last data filed at 11/24/2020 2100 Gross per 24 hour  Intake 1145.2 ml  Output --  Net 1145.2 ml   Net IO Since Admission: 1,782.63 mL [11/25/20 0735]  Physical Exam: General: Pleasant, comfortable-appearing female laying in bed. No acute distress CV: Regular rate and regular rhythm. No murmurs, rubs, or gallops. 1+ bilateral lower extremity edema up to mid tibia Pulmonary: Lungs CTAB. Normal effort. No wheezing or rales. Abdominal: Soft, nontender, nondistended. Normal bowel sounds. Extremities: Palpable radial and DP pulses. Normal ROM. Scar on R ankle s/p ORIF.  Skin: Warm and dry. No obvious rash or lesions. Neuro: A&Ox3. Moves all extremities but has slightly reduced strength in right lower extremity, about 3/5 compared to 4/5 on the left. Normal  sensation. Psych: Normal mood and affect  Patient Lines/Drains/Airways Status     Active Line/Drains/Airways     Name Placement date Placement time Site Days   Peripheral IV 11/22/20 20 G 1.88" Right;Lateral Forearm 11/22/20  1621  Forearm  3   Incision (Closed) 05/05/20 Ankle Right 05/05/20  1051  -- 204            Pertinent Labs: CBC Latest Ref Rng & Units 11/23/2020 11/22/2020 11/07/2020  WBC 4.0 - 10.5 K/uL 6.9 5.5 4.3  Hemoglobin 12.0 - 15.0 g/dL 12.1 12.2 12.2  Hematocrit 36.0 - 46.0 % 36.1 37.7 37.5  Platelets 150 - 400 K/uL 155 180 160    CMP Latest Ref Rng & Units 11/25/2020 11/24/2020 11/23/2020  Glucose 70 - 99 mg/dL 172(H) 197(H) 161(H)  BUN 6 - 20 mg/dL 29(H) 31(H) 24(H)  Creatinine 0.44 - 1.00 mg/dL 1.07(H) 1.25(H) 1.11(H)  Sodium 135 - 145 mmol/L 137 136 134(L)  Potassium 3.5 - 5.1 mmol/L 4.2 4.3 4.6  Chloride 98 - 111 mmol/L 104 104 104  CO2 22 - 32 mmol/L '24 26 25  '$ Calcium 8.9 - 10.3 mg/dL 9.2 9.0 9.3  Total Protein 6.5 - 8.1 g/dL - - -  Total Bilirubin 0.3 - 1.2 mg/dL - - -  Alkaline Phos 38 - 126 U/L - - -  AST 15 - 41 U/L - - -  ALT 0 - 44 U/L - - -    Recent Labs    11/24/20 1805  11/24/20 2252 11/25/20 0639  GLUCAP 151* 125* 167*      ASSESSMENT/PLAN:  Assessment: Principal Problem:   Orthostatic hypotension Active Problems:   Type II diabetes mellitus (HCC)   (HFpEF) heart failure with preserved ejection fraction (HCC)   Atrial fibrillation (HCC)   Early satiety   Lower extremity weakness   CKD (chronic kidney disease) stage 3, GFR 30-59 ml/min (HCC)   Plan: #Orthostatic hypotension Patient has had multiple episodes of hypotension that have been worked up several times and have all been attributed to autonomic dysfunction and poor oral intake. Has occasional episodes of dizziness upon standing/walking, although her symptoms have improved with fluids. Orthostatic BPs remain positive today.  Adrenal insufficiency ruled out.  Receiving no  antihypertensives. - Holding off on fluids as patient is euvolemic - Encourage patient to work with PT/OT  - Compression stockings ordered - May need to start midodrine and tolerate higher systolic pressures while seated.  #Atrial fibrillation with RVR Patient has a known history of atrial fibrillation and has been back in normal sinus rhythm since 8/5. She has not been taking home anticoagulants and was noted to have right calf enlargement as compared to the left. No evidence of DVT noted in right lower extremity on doppler. - Continue Eliquis 5 mg BID - Hold off on metoprolol, will start when patient is not orthostatic   #Type 2 diabetes mellitus with diabetic neuropathy Patient endorses adherence to her diabetic management including NovoLOG 70-30 30 units in the morning and 15 units in the evening, as well as metformin 1,000 mg twice daily. HbA1c 13.5% as of 8/3. She has received metformin, linagliptin, Levemir, and SSI since admission. Blood sugars have been ranging from 160s-170s in the 24 hours.  - Continue Linagliptin 5 mg daily - Continue metformin 1,000 mg twice daliy - Continue Levemir to 15 units nightly and novoLOG SS injection three times daily with meals - Will need to optimize diabetes regimen upon discharge, likely with long acting insulin as opposed to 70-30 insulin   #Chronic HFpEF Diagnosed via TTE October 2021, EF 50-55%. Patient appears clinically euvolemic at this time. Has been receiving fluids as she has been orthostatic. Will continue to monitor for any signs of volume overload.   #CKD stage 3a Initial labs showed BUN/Cr 27/1.18 and GFR 57. Cr improved to 1.07 today. - Continue to monitor renal function   #Early satiety Likely diagnosis of gastroparesis as a result of poorly controlled diabetes although a gastric emptying study has never been performed. She has been prescribed Reglan in the past, however has not been taking it. - Continue Reglan 5 mg TID -  Continue to monitor oral intake   #Lower extremity weakness Patient has a history of diabetic neuropathy and a recent ORIF on her right ankle after a fracture. She reports continued weakness and pain in the ankle and states that both of her feet feel heavy. She had decreased strength in her right lower extremity as compared to the left. Patient would like to go to a rehab facility upon discharge to build up her strength. - Thiamine still pending  - PT/OT to eval and treat  Best Practice: Diet: carb modified diet IVF: none VTE: Apixaban Code: Full Therapy Recs: PT/OT recommending outpatient PT vs OT recommending SNF Family Contact: daughter, at bedside. DISPO: Anticipated discharge Home pending clinical improvement .  Signature: Buddy Duty, D.O.  Internal Medicine Resident, PGY-1 [facility]  Pager: [phone] 7:35 AM, 11/25/2020  Please contact the on call pager after 5 pm and on weekends at [phone].

## 2020-11-26 DIAGNOSIS — I951 Orthostatic hypotension: Secondary | ICD-10-CM | POA: Diagnosis not present

## 2020-11-26 LAB — GLUCOSE, CAPILLARY
Glucose-Capillary: 147 mg/dL — ABNORMAL HIGH (ref 70–99)
Glucose-Capillary: 164 mg/dL — ABNORMAL HIGH (ref 70–99)
Glucose-Capillary: 121 mg/dL — ABNORMAL HIGH (ref 70–99)
Glucose-Capillary: 103 mg/dL — ABNORMAL HIGH (ref 70–99)
Glucose-Capillary: 131 mg/dL — ABNORMAL HIGH (ref 70–99)

## 2020-11-26 LAB — BASIC METABOLIC PANEL
Anion gap: 6 (ref 5–15)
BUN: 22 mg/dL — ABNORMAL HIGH (ref 6–20)
CO2: 29 mmol/L (ref 22–32)
Calcium: 9.3 mg/dL (ref 8.9–10.3)
Chloride: 104 mmol/L (ref 98–111)
Creatinine, Ser: 0.85 mg/dL (ref 0.44–1.00)
GFR, Estimated: >60 mL/min (ref >60)
Glucose, Bld: 189 mg/dL — ABNORMAL HIGH (ref 70–99)
Potassium: 4.5 mmol/L (ref 3.5–5.1)
Sodium: 139 mmol/L (ref 135–145)

## 2020-11-26 MED ORDER — MIDODRINE HCL 5 MG PO TABS
2.5000 mg | ORAL_TABLET | Freq: Two times a day (BID) | ORAL | Status: DC
  Administered 2020-11-26 – 2020-11-29 (×6): 2.5 mg via ORAL
  Filled 2020-11-26 (×6): qty 1

## 2020-11-26 NOTE — Plan of Care (Signed)
  Problem: Education: Goal: Knowledge of General Education information will improve Description: Including pain rating scale, medication(s)/side effects and non-pharmacologic comfort measures Outcome: Progressing   Problem: Health Behavior/Discharge Planning: Goal: Ability to manage health-related needs will improve Outcome: Progressing   Problem: Clinical Measurements: Goal: Ability to maintain clinical measurements within normal limits will improve Outcome: Progressing Goal: Will remain free from infection Outcome: Progressing Goal: Diagnostic test results will improve Outcome: Progressing Goal: Respiratory complications will improve Outcome: Progressing Goal: Cardiovascular complication will be avoided Outcome: Progressing   Problem: Nutrition: Goal: Adequate nutrition will be maintained Outcome: Progressing   Problem: Pain Managment: Goal: General experience of comfort will improve Outcome: Progressing   Problem: Safety: Goal: Ability to remain free from injury will improve Outcome: Progressing   Problem: Skin Integrity: Goal: Risk for impaired skin integrity will decrease Outcome: Progressing   

## 2020-11-26 NOTE — Progress Notes (Signed)
Pt reported soreness to PIV site and requested that IV be removed. PIV removed; cath intact. Guaze and tape applied to site.

## 2020-11-26 NOTE — Progress Notes (Signed)
HD#4 SUBJECTIVE:  Patient Summary: Jennifer [name] is a 50 y.o. with a pertinent PMH of poorly uncontrolled diabetes, peripheral neuropathy, atrial fibrillation, and HFpEF, who presented directly from the [facility] for orthostatic hypotension.  Overnight Events: No acute events overnight reported.  Interim History: This is hospital day 4 for Jennifer [name] who was seen and evaluated at the bedside this morning. She continues to feel better, but does still have drops in her blood pressure upon standing. She states that she does not feel as dizzy during these episodes, but does still endorse dizziness. Patient continues to have a feeling of burning in her feet. She was previously put on gabapentin for this, however, this is held in the setting of her hypotension.  OBJECTIVE:  Vital Signs: Vitals:   11/25/20 1527 11/25/20 2033 11/26/20 0824 11/26/20 1113  BP: 107/64 (!) 146/84 (!) 149/75 125/60  Pulse: 83 96 87 83  Resp:  16  20  Temp: 98.6 F (37 C) 98.7 F (37.1 C) 98.8 F (37.1 C) 98.8 F (37.1 C)  TempSrc: Oral Oral Oral Oral  SpO2: 99% 100% 100% 99%  Weight:      Height:       Supplemental O2: Room Air SpO2: 99 %  Filed Weights   11/23/20 1500  Weight: 71.2 kg     Intake/Output Summary (Last 24 hours) at 11/26/2020 1245 Last data filed at 11/26/2020 0000 Gross per 24 hour  Intake --  Output 350 ml  Net -350 ml   Net IO Since Admission: 1,432.63 mL [11/26/20 1245]  Physical Exam: General: Pleasant, comfortable-appearing female laying in bed. No acute distress CV: Regular rate and regular rhythm. No murmurs, rubs, or gallops. Trace bilateral lower extremity edema up to mid tibia Pulmonary: Lungs CTAB. Normal effort. No wheezing or rales. Abdominal: Soft, nontender, nondistended. Normal bowel sounds. Extremities: Palpable radial and DP pulses. Normal ROM. Scar on R ankle s/p ORIF. Skin: Warm and dry. No obvious rash or lesions. Neuro: A&Ox3. Moves all extremities but has  slightly reduced strength in right lower extremity, about 3/5 compared to 4/5 on the left. Normal sensation. Psych: Normal mood and affect  Patient Lines/Drains/Airways Status     Active Line/Drains/Airways     Name Placement date Placement time Site Days   Peripheral IV 11/22/20 20 G 1.88" Right;Lateral Forearm 11/22/20  1621  Forearm  4   Incision (Closed) 05/05/20 Ankle Right 05/05/20  1051  -- 205            Pertinent Labs: CBC Latest Ref Rng & Units 11/23/2020 11/22/2020 11/07/2020  WBC 4.0 - 10.5 K/uL 6.9 5.5 4.3  Hemoglobin 12.0 - 15.0 g/dL 12.1 12.2 12.2  Hematocrit 36.0 - 46.0 % 36.1 37.7 37.5  Platelets 150 - 400 K/uL 155 180 160    CMP Latest Ref Rng & Units 11/26/2020 11/25/2020 11/24/2020  Glucose 70 - 99 mg/dL 189(H) 172(H) 197(H)  BUN 6 - 20 mg/dL 22(H) 29(H) 31(H)  Creatinine 0.44 - 1.00 mg/dL 0.85 1.07(H) 1.25(H)  Sodium 135 - 145 mmol/L 139 137 136  Potassium 3.5 - 5.1 mmol/L 4.5 4.2 4.3  Chloride 98 - 111 mmol/L 104 104 104  CO2 22 - 32 mmol/L '29 24 26  '$ Calcium 8.9 - 10.3 mg/dL 9.3 9.2 9.0  Total Protein 6.5 - 8.1 g/dL - - -  Total Bilirubin 0.3 - 1.2 mg/dL - - -  Alkaline Phos 38 - 126 U/L - - -  AST 15 - 41 U/L - - -  ALT 0 - 44 U/L - - -    Recent Labs    11/25/20 2122 11/26/20 0638 11/26/20 1151  GLUCAP 147* 164* 121*     Pertinent Imaging: No results found.  ASSESSMENT/PLAN:  Assessment: Principal Problem:   Orthostatic hypotension Active Problems:   Type II diabetes mellitus (HCC)   (HFpEF) heart failure with preserved ejection fraction (HCC)   Atrial fibrillation (HCC)   Early satiety   Lower extremity weakness   CKD (chronic kidney disease) stage 3, GFR 30-59 ml/min (HCC)   Plan: #Orthostatic hypotension Patient has had multiple episodes of hypotension that have been worked up several times and have all been attributed to autonomic dysfunction and poor oral intake. Has occasional episodes of dizziness upon standing/walking, although  her symptoms have improved with fluids. Orthostatic BPs remain positive yesterday, however, patient stayed in normal sinus rhythm. Adrenal insufficiency ruled out. Patient has no other signs/symptoms of neurodegenerative disorders such as Parkinson's or Lewy Body dementia, thus this is likely not the culprit of her orthostasis. Likely not to be autonomic dysfunction secondary to impairment of ganglionic nicotinic acetylcholine receptors as patient does not have any other sxs such as neurogenic bladder, dry eye, or dry mouth. Paraneoplastic syndrome also not likely as no malignancy has been noted on imaging. Familial dysautonomia not likely the cause as this would have been expressed at birth with impaired pain and temperature sensation. Volume depletion was suspected initially and patient responded well to fluids, however, she is euvolemic and remains orthostatic. Patient is also not on any medications that could likely cause autonomic dysfunction. Diabetes mellitis is the most common cause of autonomic neuropathy and is likely the cause of orthostatic hypotension in this patient, as her diabetes is not well controlled, with A1c of 13.5%.  - Holding off on fluids as patient is euvolemic - Encourage patient to work with PT/OT  - Compression stockings  - Orthostatic BPs today pending - Consider starting midodrine and may need to tolerate higher systolic pressures while seated.  #Atrial fibrillation with RVR Patient has a known history of atrial fibrillation and has been back in normal sinus rhythm since 8/5.  - Continue Eliquis 5 mg BID - Hold off on metoprolol, will start when patient is not orthostatic   #Type 2 diabetes mellitus with diabetic neuropathy Patient endorses adherence to her diabetic management including NovoLOG 70-30 30 units in the morning and 15 units in the evening, as well as metformin 1,000 mg twice daily. HbA1c 13.5% as of 8/3. She has received metformin, linagliptin, Levemir, and  SSI since admission. Blood sugars have been ranging from 100s-160s in the 24 hours.  - Continue Linagliptin 5 mg daily - Continue metformin 1,000 mg twice daliy - Continue Levemir to 15 units nightly and novoLOG SS injection three times daily with meals - Will need to optimize diabetes regimen upon discharge, likely with long acting insulin as opposed to 70-30 insulin   #Chronic HFpEF Diagnosed via TTE October 2021, EF 50-55%. Patient remains euvolemic at this time. Will continue to monitor for any signs of volume overload, however, patient is no long receiving IV fluids.    #CKD stage 3a Initial labs showed BUN/Cr 27/1.18 and GFR 57. Cr improved to 0.85 today. - Continue to monitor renal function   #Early satiety Likely diagnosis of gastroparesis as a result of poorly controlled diabetes although a gastric emptying study has never been performed. She has been prescribed Reglan in the past, however has not been taking it. -  Continue Reglan 5 mg TID - Continue to monitor oral intake   #Lower extremity weakness Patient has a history of diabetic neuropathy and a recent ORIF on her right ankle after a fracture. She reports continued weakness and states that both of her feet feel like they burn. She has decreased strength in her right lower extremity as compared to the left. Patient would like to go to a rehab facility upon discharge to build up her strength, and PT/OT echo this sentiment. - Thiamine still pending  - PT/OT to eval and treat - Recommending SNF upon discharge, TOC consulted as patient needs placement   Best Practice: Diet: carb modified diet IVF: none VTE: Apixaban Code: Full Therapy Recs: PT/OT recommending SNF, TOC consulted Family Contact: daughter, at bedside. DISPO: Anticipated discharge SNF pending clinical improvement and placement.  Signature: Buddy Duty, D.O.  Internal Medicine Resident, PGY-1 [facility]  Pager:  [phone] 12:45 PM, 11/26/2020   Please contact the on call pager after 5 pm and on weekends at [phone].

## 2020-11-27 ENCOUNTER — Other Ambulatory Visit: Payer: Self-pay | Admitting: Obstetrics and Gynecology

## 2020-11-27 DIAGNOSIS — I951 Orthostatic hypotension: Secondary | ICD-10-CM | POA: Diagnosis not present

## 2020-11-27 LAB — BASIC METABOLIC PANEL
Anion gap: 7 (ref 5–15)
BUN: 17 mg/dL (ref 6–20)
CO2: 25 mmol/L (ref 22–32)
Calcium: 9.3 mg/dL (ref 8.9–10.3)
Chloride: 105 mmol/L (ref 98–111)
Creatinine, Ser: 0.83 mg/dL (ref 0.44–1.00)
GFR, Estimated: >60 mL/min (ref >60)
Glucose, Bld: 144 mg/dL — ABNORMAL HIGH (ref 70–99)
Potassium: 4.4 mmol/L (ref 3.5–5.1)
Sodium: 137 mmol/L (ref 135–145)

## 2020-11-27 LAB — GLUCOSE, CAPILLARY
Glucose-Capillary: 76 mg/dL (ref 70–99)
Glucose-Capillary: 78 mg/dL (ref 70–99)
Glucose-Capillary: 121 mg/dL — ABNORMAL HIGH (ref 70–99)
Glucose-Capillary: 88 mg/dL (ref 70–99)
Glucose-Capillary: 96 mg/dL (ref 70–99)
Glucose-Capillary: 145 mg/dL — ABNORMAL HIGH (ref 70–99)

## 2020-11-27 NOTE — Progress Notes (Signed)
Occupational Therapy Treatment Patient Details Name: Jennifer [name] MRN: [id number] DOB: July 04, 1970 Today's Date: 11/27/2020    History of present illness Pt is a 50 y/o female admitted secondary to dizziness, orthostatic hypotension and uncontrolled diabetes. PMH including but not limited to type 2 diabetes, HFpEF, atrial fibrillation, hypotension, early satiety, lower extremity weakness.   OT comments  Larysa is progressing well however continues to be limited by orthostatic BP. Pt was min A overall for bed mobility with increased time, and experienced an asymptomatic orthostatic drop in BP from sup>sit. Given close min guard for safety pt ambulated short distance with rw to [facility] and completed all toileting needs. Pt continued to deny any dizziness, blurred vision or nausea throughout session. Pt continued to benefit from continued OT acutely. D/c remains appropriate.   Supine BP: 154/64 Sitting EOB BP: 107/65 BP after 3 minutes functional activity: 103/63   Follow Up Recommendations  SNF;Supervision/Assistance - 24 hour;Home health OT          Precautions / Restrictions Precautions Precautions: Other (comment) Precaution Comments: monitor BP/orthostatics Restrictions Weight Bearing Restrictions: No       Mobility Bed Mobility Overal bed mobility: Needs Assistance Bed Mobility: Supine to Sit;Sit to Supine     Supine to sit: Min assist Sit to supine: Supervision   General bed mobility comments: min A to elevate trunk and guide hips forward    Transfers Overall transfer level: Needs assistance Equipment used: Rolling walker (2 wheeled) Transfers: Sit to/from Stand Sit to Stand: Supervision;Min guard         General transfer comment: min guard for safety    Balance Overall balance assessment: Mild deficits observed, not formally tested Sitting-balance support: Feet supported Sitting balance-Leahy Scale: Good     Standing balance support: During functional  activity;Single extremity supported;Bilateral upper extremity supported                               ADL either performed or assessed with clinical judgement   ADL Overall ADL's : Needs assistance/impaired                         Toilet Transfer: Min guard;Ambulation;BSC;RW Toilet Transfer Details (indicate cue type and reason): short ambulation to [facility] with rw due to asymptomatic orthostatic BP Toileting- Clothing Manipulation and Hygiene: Supervision/safety;Sitting/lateral lean       Functional mobility during ADLs: Min guard;Rolling walker General ADL Comments: close min guard for all OOB due to othrostatic BP      Cognition Arousal/Alertness: Awake/alert Behavior During Therapy: WFL for tasks assessed/performed Overall Cognitive Status: Within Functional Limits for tasks assessed                 General Comments: pts Son interpreting session, pt able to understand and respond some in english              General Comments orthostatic change in BP after supine>sit; stable at sit>stand and after 2 minutes of functional activity    Pertinent Vitals/ Pain       Pain Assessment: No/denies pain   Frequency  Min 2X/week        Progress Toward Goals  OT Goals(current goals can now be found in the care plan section)  Progress towards OT goals: Progressing toward goals  Acute Rehab OT Goals Patient Stated Goal: to feel better OT Goal Formulation: With patient Time For Goal Achievement: 12/07/20 Potential  to Achieve Goals: Fair ADL Goals Pt/caregiver will Perform Home Exercise Program: Increased strength;Both right and left upper extremity;With Supervision;With written HEP provided Additional ADL Goal #1: pt will be mod indep with bed moblity in order to improve indep and safety with completion of basic self care tasksn  Plan Discharge plan remains appropriate       AM-PAC OT "6 Clicks" Daily Activity     Outcome Measure   Help from  another person eating meals?: A Little Help from another person taking care of personal grooming?: A Little Help from another person toileting, which includes using toliet, bedpan, or urinal?: A Little Help from another person bathing (including washing, rinsing, drying)?: A Lot Help from another person to put on and taking off regular upper body clothing?: A Little Help from another person to put on and taking off regular lower body clothing?: A Lot 6 Click Score: 16    End of Session Equipment Utilized During Treatment: Gait belt (BSC)  OT Visit Diagnosis: Muscle weakness (generalized) (M62.81);Repeated falls (R29.6)   Activity Tolerance Patient tolerated treatment well   Patient Left in bed;with bed alarm set;with family/visitor present;with call bell/phone within reach   Nurse Communication Mobility status;Other (comment)        [id number] OT Time Calculation (min): 17 min  Charges: OT General Charges $OT Visit: 1 Visit OT Treatments $Self Care/Home Management : 8-22 mins    Corry Ihnen A Daine Gunther 11/27/2020, 5:27 PM

## 2020-11-27 NOTE — TOC Initial Note (Signed)
Transition of Care [facility]) - Initial/Assessment Note    Patient Details  Name: Jennifer [name] MRN: [id number] Date of Birth: [birth year]  [facility] of [facility]) CM/SW Contact:    Geralynn Ochs, LCSW Phone Number: 11/27/2020, 2:05 PM  Clinical Narrative:      CSW following for SNF placement. Per chart review, patient and family agreeable to SNF at discharge. CSW has faxed out referral, will follow up with patient on bed offers when received. CSW to follow.             Expected Discharge Plan: Skilled Nursing Facility Barriers to Discharge: Continued Medical Work up, Ship broker   Patient Goals and CMS Choice Patient states their goals for this hospitalization and ongoing recovery are:: to get rehab CMS Medicare.gov Compare Post Acute Care list provided to:: Patient Choice offered to / list presented to : Patient  Expected Discharge Plan and Services Expected Discharge Plan: [place] Choice: [place] Living arrangements for the past 2 months: Apartment                                      Prior Living Arrangements/Services Living arrangements for the past 2 months: Apartment   Patient language and need for interpreter reviewed:: No Do you feel safe going back to the place where you live?: Yes      Need for Family Participation in Patient Care: No (Comment) Care giver support system in place?: No (comment)   Criminal Activity/Legal Involvement Pertinent to Current Situation/Hospitalization: No - Comment as needed  Activities of Daily Living Home Assistive Devices/Equipment: Gilford Rile (specify type) ADL Screening (condition at time of admission) Patient's cognitive ability adequate to safely complete daily activities?: Yes Is the patient deaf or have difficulty hearing?: No Does the patient have difficulty seeing, even when wearing glasses/contacts?: No Does the patient have difficulty concentrating,  remembering, or making decisions?: No Patient able to express need for assistance with ADLs?: Yes Does the patient have difficulty dressing or bathing?: No Independently performs ADLs?: Yes (appropriate for developmental age) Does the patient have difficulty walking or climbing stairs?: Yes Weakness of Legs: Both Weakness of Arms/Hands: None  Permission Sought/Granted Permission sought to share information with : Facility Sport and exercise psychologist, Family Supports Permission granted to share information with : Yes, Verbal Permission Granted     Permission granted to share info w AGENCY: SNF        Emotional Assessment Appearance:: Appears stated age Attitude/Demeanor/Rapport: Engaged Affect (typically observed): Appropriate Orientation: : Oriented to Self, Oriented to Place, Oriented to  Time, Oriented to Situation Alcohol / Substance Use: Not Applicable Psych Involvement: No (comment)  Admission diagnosis:  Orthostatic hypotension [I95.1] Patient Active Problem List   Diagnosis Date Noted   Orthostatic hypotension 11/22/2020   Lower extremity weakness 11/22/2020   CKD (chronic kidney disease) stage 3, GFR 30-59 ml/min ([place]) 11/22/2020   Depression 08/22/2020   Weight loss, non-intentional 07/04/2020   Adult failure to thrive 07/04/2020   Nausea & vomiting 06/28/2020   Early satiety 05/31/2020   UTI (urinary tract infection) 05/16/2020   Elevated liver enzymes 05/15/2020   Closed trimalleolar fracture of right ankle 05/02/2020   Cellulitis (Left 2nd toe) 03/27/2020   Cellulitis of left foot 03/17/2020   AKI (acute kidney injury) ([place]) 03/14/2020   Right ankle injury 09/28/2019   Bilateral lower extremity edema 06/07/2019  Atrial fibrillation ([place]) 09/20/2018   Migraine    Hematuria 08/24/2018   Vulvovaginitis 05/21/2018   (HFpEF) heart failure with preserved ejection fraction ([place]) 01/01/2018   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   High risk medication use 09/16/2017    Pseudophakia of right eye 09/16/2017   Left anterior shoulder pain 07/06/2017   Hypertension associated with diabetes ([place]) 07/23/2016   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   Fatigue 04/02/2016   Uveitic glaucoma of left eye, 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   Vitamin D deficiency 10/30/2014   Diabetic neuropathy ([place]) 05/13/2013   Iron deficiency anemia 05/13/2013   Hair loss 03/24/2012   Healthcare maintenance 02/26/2011   Hyperlipidemia 05/05/2006   Type II diabetes mellitus ([place]) 04/22/1998   PCP:  Jose Persia, MD Pharmacy:   [id number] [address] [id number] [id number]: [phone]: [phone]    Social Determinants of Health ([place] Interventions    Readmission Risk Interventions Readmission Risk Prevention Plan 04/12/2020  Transportation Screening Complete  PCP or Specialist Appt within 3-5 Days Complete  HRI or [place]  Social Work Consult for [place]anning/Counseling Complete  Palliative Care Screening Not Applicable  Medication Review (Press photographer Complete  Some recent data might be hidden

## 2020-11-27 NOTE — Patient Outreach (Signed)
Care Coordination  11/27/2020  Jennifer [name] 1971-02-19 [id number]  Jennifer [name] is currently admitted as an inpatient at [facility] team will follow the progress of Jennifer [name] and follow up upon discharge.   [name] RN, BSN [place]  [employer] - Managed Medicaid High Risk [phone].

## 2020-11-27 NOTE — Progress Notes (Signed)
HD#5 SUBJECTIVE:  Patient Summary: Jennifer [name] is a 50 y.o. with a pertinent PMH of poorly uncontrolled diabetes, peripheral neuropathy, atrial fibrillation, and HFpEF, who presented directly from the [facility] for orthostatic hypotension.  Overnight Events: No acute events overnight.  Interim History: This is hospitdal day 5 for Jennifer [name] who was seen and evaluated at the bedside this morning. Reports feeling "good" but not able to get out of bed yesterday; discussed working with PT/OT yesterday. She is concerned about right foot being edematous, however, we discussed that this is secondary to recent surgery. Also discussed diagnosis of autonomic dysfunction and importance of getting up from laying/sitting slowly and also the importance of her diabetes regimen.   OBJECTIVE:  Vital Signs: Vitals:   11/26/20 1307 11/26/20 1310 11/26/20 1900 11/27/20 0600  BP: 111/65 (!) 89/61 (!) 170/77 (!) 141/71  Pulse: 85 87 92 83  Resp:   17   Temp:   98.6 F (37 C)   TempSrc:      SpO2:  97% 100%   Weight:      Height:       Supplemental O2: Room Air SpO2: 100 %  Filed Weights   11/23/20 1500  Weight: 71.2 kg     Intake/Output Summary (Last 24 hours) at 11/27/2020 [id number] Last data filed at 11/26/2020 1300 Gross per 24 hour  Intake 240 ml  Output --  Net 240 ml   Net IO Since Admission: 1,672.63 mL [11/27/20 0658]  Physical Exam: General: Pleasant, comfortable-appearing female laying in bed. No acute distress CV: Regular rate and regular rhythm. No murmurs, rubs, or gallops. Trace bilateral lower mostly limited to feet. Pulmonary: Lungs CTAB. Normal effort. No wheezing or rales. Abdominal: Soft, nontender, nondistended. Normal bowel sounds. Extremities: Palpable radial and DP pulses. Normal ROM. Scar on R ankle s/p ORIF. Skin: Warm and dry. No obvious rash or lesions. Neuro: A&Ox3. Moves all extremities and uses walker to ambulate Psych: Normal mood and affect  Patient  Lines/Drains/Airways Status     Active Line/Drains/Airways     Name Placement date Placement time Site Days   Incision (Closed) 05/05/20 Ankle Right 05/05/20  1051  -- 206            Pertinent Labs: CBC Latest Ref Rng & Units 11/23/2020 11/22/2020 11/07/2020  WBC 4.0 - 10.5 K/uL 6.9 5.5 4.3  Hemoglobin 12.0 - 15.0 g/dL 12.1 12.2 12.2  Hematocrit 36.0 - 46.0 % 36.1 37.7 37.5  Platelets 150 - 400 K/uL 155 180 160    CMP Latest Ref Rng & Units 11/27/2020 11/26/2020 11/25/2020  Glucose 70 - 99 mg/dL 144(H) 189(H) 172(H)  BUN 6 - 20 mg/dL 17 22(H) 29(H)  Creatinine 0.44 - 1.00 mg/dL 0.83 0.85 1.07(H)  Sodium 135 - 145 mmol/L 137 139 137  Potassium 3.5 - 5.1 mmol/L 4.4 4.5 4.2  Chloride 98 - 111 mmol/L 105 104 104  CO2 22 - 32 mmol/L '25 29 24  '$ Calcium 8.9 - 10.3 mg/dL 9.3 9.3 9.2  Total Protein 6.5 - 8.1 g/dL - - -  Total Bilirubin 0.3 - 1.2 mg/dL - - -  Alkaline Phos 38 - 126 U/L - - -  AST 15 - 41 U/L - - -  ALT 0 - 44 U/L - - -    Recent Labs    11/26/20 2039 11/27/20 0639 11/27/20 0656  GLUCAP 131* 76 78     Pertinent Imaging: No results found.  ASSESSMENT/PLAN:  Assessment: Principal Problem:  Orthostatic hypotension Active Problems:   Type II diabetes mellitus (HCC)   (HFpEF) heart failure with preserved ejection fraction (HCC)   Atrial fibrillation (HCC)   Early satiety   Lower extremity weakness   CKD (chronic kidney disease) stage 3, GFR 30-59 ml/min (HCC)   Plan: #Autonomic dysfunction #Orthostatic hypotension Patient has had multiple episodes of hypotension that have been worked up several times and have all been attributed to autonomic dysfunction and poor oral intake. Discussed this with the patient and worked up multiple causes of autonomic dysfunction between previous clinic visits and hospitalizations. Diabetes mellitis is the most common cause of autonomic neuropathy and is likely the culprit of orthostatic hypotension in this patient. Ruled out other  causes of autonomic dysfunction, see previous A&P.  - Not requiring fluids as patient is euvolemic - Encourage patient to work with PT/OT, plan for SNF on discharge - Compression stockings  - Orthostatic BPs today pending - Started midodrine 2.'5mg'$  BID yesterday, had one episode of hypertension, however, asymptomatic  - Continue midodrine and will titrate up dose if patient remains orthostatic - Likely will need to tolerate higher blood pressures while seated  #Atrial fibrillation with RVR Patient has a known history of atrial fibrillation and has been back in normal sinus rhythm since 8/5. Regular rate and rhythm upon exam today.  - Continue Eliquis 5 mg BID - Hold off on metoprolol, will start when patient is not orthostatic   #Type 2 diabetes mellitus with diabetic neuropathy Patient endorses adherence to her diabetic management including NovoLOG 70-30 30 units in the morning and 15 units in the evening, as well as metformin 1,000 mg twice daily. HbA1c 13.5% as of 8/3. She has been managed with metformin, linagliptin, Levemir, and SSI since admission. Blood sugars have been ranging from 100s-140s in the 24 hours.  - Continue Linagliptin 5 mg daily - Continue metformin 1,000 mg twice daliy - Continue Levemir to 15 units nightly and novoLOG SS injection three times daily with meals - Will optimize diabetes regimen upon discharge   #Chronic HFpEF Diagnosed via TTE October 2021, EF 50-55%. Patient remains euvolemic at this time. Will continue to monitor for any signs of volume overload, however, patient is no long receiving IV fluids.    #CKD stage 3a Initial labs showed BUN/Cr 27/1.18 and GFR 57. Cr improved to 0.85 today. - Continue to monitor renal function   #Early satiety Likely diagnosis of gastroparesis as a result of poorly controlled diabetes although a gastric emptying study has never been performed. She has been prescribed Reglan in the past, however has not been taking it. -  Continue Reglan 5 mg TID - Continue to monitor oral intake   #Lower extremity weakness Patient has a history of diabetic neuropathy and a recent ORIF on her right ankle after a fracture. She reports continued weakness and some burning in her feet. She has decreased strength in her right lower extremity as compared to the left. Patient is a candidate for SNF upon discharge. - PT/OT to eval and treat - TOC consulted for SNF placement  Best Practice: Diet: carb modified diet IVF: none VTE: Apixaban Code: Full Therapy Recs: PT/OT recommending SNF, TOC consulted Family Contact: daughter, at bedside. DISPO: Anticipated discharge in 1-2 days SNF pending placement.   Signature: Buddy Duty, D.O.  Internal Medicine Resident, PGY-1 [facility]  Pager: [phone] 6:58 AM, 11/27/2020   Please contact the on call pager after 5 pm and on weekends at [phone].

## 2020-11-27 NOTE — Progress Notes (Signed)
Nutrition Follow-up  DOCUMENTATION CODES:   Not applicable  INTERVENTION:   -Continue Glucerna Shake po BID, each supplement provides 220 kcal and 10 grams of protein  -Continue Ensure Max po daily, each supplement provides 150 kcal and 30 grams of protein.   -Continue MVI with minerals daily  NUTRITION DIAGNOSIS:   Inadequate oral intake related to chronic illness, decreased appetite (gastroparesis) as evidenced by per patient/family report.  Ongoing  GOAL:   Patient will meet greater than or equal to 90% of their needs  Progressing   MONITOR:   PO intake, Supplement acceptance, Labs, Weight trends, Skin, I & O's  REASON FOR ASSESSMENT:   Malnutrition Screening Tool    ASSESSMENT:   Jennifer [name] is a 50 y.o. female with past medical history of type 2 diabetes, HFpEF, atrial fibrillation, hypotension, early satiety, lower extremity weakness, who [facility] as direct [facility] due to orthostatic hypotension and uncontrolled diabetes.  Reviewed I/O's: +240 ml x 24 hours and +1.7 L since admission  Spoke with pt and son at bedside. Pt son shares that pt is eating better and consumed most of her breakfast. Noted meal completion 60-100%. Pt also really likes the Glucerna supplements and is consuming them. Discussed with pt son where he could purchase them outside of the hospital.  Per [facility] notes, plan for SNF placement once medically stable.   Labs reviewed: CBGS: 76-121 (inpatient orders for glycemic control are 5 mg linagliptin daily, 1000 mg metformin BID, 15 units insulin detemir daily at bedtime, and 0-15 units insulin aspart TID with meals).    NUTRITION - FOCUSED PHYSICAL EXAM:  Flowsheet Row Most Recent Value  Orbital Region Mild depletion  Upper Arm Region No depletion  Thoracic and Lumbar Region No depletion  Buccal Region No depletion  Temple Region Mild depletion  Clavicle Bone Region No depletion  Clavicle and Acromion Bone Region  No depletion  Scapular Bone Region No depletion  Dorsal Hand No depletion  Patellar Region No depletion  Anterior Thigh Region No depletion  Posterior Calf Region No depletion  Edema (RD Assessment) Mild  Hair Reviewed  Eyes Reviewed  Mouth Reviewed  Skin Reviewed  Nails Reviewed       Diet Order:   Diet Order             Diet Carb Modified Fluid consistency: Thin; Room service appropriate? Yes  Diet effective now                   EDUCATION NEEDS:   No education needs have been identified at this time  Skin:  Skin Assessment: Reviewed RN Assessment  Last BM:  11/22/20  Height:   Ht Readings from Last 1 Encounters:  11/23/20 '5\' 6"'$  (1.676 m)    Weight:   Wt Readings from Last 1 Encounters:  11/23/20 71.2 kg    Ideal Body Weight:  61.4 kg  BMI:  Body mass index is 25.34 kg/m.  Estimated Nutritional Needs:   Kcal:  [id number]  Protein:  90-105 grams  Fluid:  > 1.7 L    Loistine Chance, RD, LDN, [place] Registered Dietitian II Certified Diabetes Care and Education Specialist Please refer to [facility] for RD and/or RD on-call/weekend/after hours pager

## 2020-11-27 NOTE — NC FL2 (Signed)
[place] LEVEL OF CARE SCREENING TOOL     IDENTIFICATION  Patient Name: Jennifer [name] Birthdate: 1970/10/12 Sex: female Admission Date (Current Location): 11/22/2020  [facility] and [place] Number:  Herbalist and Address:  [facility] [place], [place] [address] [place], [place] [address]      Provider Number: [id number]  Attending Physician Name and Address:  Axel Filler, *  Relative Name and Phone Number:       Current Level of Care: Hospital Recommended Level of Care: [place] Prior Approval Number:    Date Approved/Denied:   PASRR Number: [id number]  Discharge Plan: SNF    Current Diagnoses: Patient Active Problem List   Diagnosis Date Noted   Orthostatic hypotension 11/22/2020   Lower extremity weakness 11/22/2020   CKD (chronic kidney disease) stage 3, GFR 30-59 ml/min ([place]) 11/22/2020   Depression 08/22/2020   Weight loss, non-intentional 07/04/2020   Adult failure to thrive 07/04/2020   Nausea & vomiting 06/28/2020   Early satiety 05/31/2020   UTI (urinary tract infection) 05/16/2020   Elevated liver enzymes 05/15/2020   Closed trimalleolar fracture of right ankle 05/02/2020   Cellulitis (Left 2nd toe) 03/27/2020   Cellulitis of left foot 03/17/2020   AKI (acute kidney injury) ([place]) 03/14/2020   Right ankle injury 09/28/2019   Bilateral lower extremity edema 06/07/2019   Atrial fibrillation ([place]) 09/20/2018   Migraine    Hematuria 08/24/2018   Vulvovaginitis 05/21/2018   (HFpEF) heart failure with preserved ejection fraction ([place]) 01/01/2018   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   High risk medication use 09/16/2017   Pseudophakia of right eye 09/16/2017   Left anterior shoulder pain 07/06/2017   Hypertension associated with diabetes ([place]) 07/23/2016   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   Fatigue 04/02/2016   Uveitic glaucoma of left eye, 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   Vitamin D  deficiency 10/30/2014   Diabetic neuropathy ([place]) 05/13/2013   Iron deficiency anemia 05/13/2013   Hair loss 03/24/2012   Healthcare maintenance 02/26/2011   Hyperlipidemia 05/05/2006   Type II diabetes mellitus ([place]) 04/22/1998    Orientation RESPIRATION BLADDER Height & Weight     Self, Time, Situation, Place  Normal Continent Weight: 157 lb (71.2 kg) Height:  '5\' 6"'$  (167.6 cm)  BEHAVIORAL SYMPTOMS/MOOD NEUROLOGICAL BOWEL NUTRITION STATUS      Continent Diet (carb modified)  AMBULATORY STATUS COMMUNICATION OF NEEDS Skin   Limited Assist Verbally Normal                       Personal Care Assistance Level of Assistance  Bathing, Feeding, Dressing Bathing Assistance: Limited assistance Feeding assistance: Independent Dressing Assistance: Limited assistance     Functional Limitations Info  Sight Sight Info: Impaired (blind L eye)        SPECIAL CARE FACTORS FREQUENCY  PT (By licensed PT), OT (By licensed OT)     PT Frequency: 5x/wk OT Frequency: 5x/wk            Contractures Contractures Info: Not present    Additional Factors Info  Code Status, Allergies, Insulin Sliding Scale Code Status Info: Full Allergies Info: NKA   Insulin Sliding Scale Info: see DC summary       Current Medications (11/27/2020):  This is the current hospital active medication list Current Facility-Administered Medications  Medication Dose Route Frequency Provider Last Rate Last Admin   acetaminophen (TYLENOL) tablet 650 mg  650 mg Oral  Q6H PRN Iona Beard, MD   650 mg at 11/22/20 2328   Or   acetaminophen (TYLENOL) suppository 650 mg  650 mg Rectal Q6H PRN Iona Beard, MD       apixaban Arne [place]) tablet 5 mg  5 mg Oral BID Atway, Rayann N, DO   5 mg at 11/27/20 0953   brimonidine (ALPHAGAN) 0.2 % ophthalmic solution 1 drop  1 drop Left Eye Q8H Aslam, Sadia, MD   1 drop at 11/27/20 1308   dorzolamide-timolol (COSOPT) 22.3-6.8 MG/ML ophthalmic solution 1 drop  1 drop Left  Eye BID Aslam, Loralyn [place], MD   1 drop at 11/27/20 0955   feeding supplement (GLUCERNA SHAKE) (GLUCERNA SHAKE) liquid 237 mL  237 mL Oral BID BM Axel Filler, MD   237 mL at 11/27/20 1308   insulin aspart (novoLOG) injection 0-15 Units  0-15 Units Subcutaneous TID WC Iona Beard, MD   2 Units at 11/26/20 1306   insulin detemir (LEVEMIR) injection 15 Units  15 Units Subcutaneous QHS Atway, Rayann N, DO   15 Units at 11/26/20 2130   linagliptin (TRADJENTA) tablet 5 mg  5 mg Oral Daily Iona Beard, MD   5 mg at 11/27/20 [id number]   And   metFORMIN (GLUCOPHAGE) tablet 1,000 mg  1,000 mg Oral BID WC Iona Beard, MD   1,000 mg at 11/27/20 [id number]   metoCLOPramide (REGLAN) tablet 5 mg  5 mg Oral TID AC & HS Iona Beard, MD   5 mg at 11/27/20 1308   midodrine (PROAMATINE) tablet 2.5 mg  2.5 mg Oral BID WC Atway, Rayann N, DO   2.5 mg at 11/27/20 [id number]   multivitamin with minerals tablet 1 tablet  1 tablet Oral Daily Axel Filler, MD   1 tablet at 11/27/20 [id number]   polyvinyl alcohol (LIQUIFILM TEARS) 1.4 % ophthalmic solution 1 drop  1 drop Both Eyes PRN Axel Filler, MD   1 drop at 11/25/20 0547   protein supplement (ENSURE MAX) liquid  11 oz Oral QHS Axel Filler, MD   11 oz at 11/26/20 2131     Discharge Medications: Please see discharge summary for a list of discharge medications.  Relevant Imaging Results:  Relevant Lab Results:   Additional Information SS#: [id number]  Geralynn Ochs, LCSW

## 2020-11-28 LAB — GLUCOSE, CAPILLARY
Glucose-Capillary: 201 mg/dL — ABNORMAL HIGH (ref 70–99)
Glucose-Capillary: 86 mg/dL (ref 70–99)
Glucose-Capillary: 208 mg/dL — ABNORMAL HIGH (ref 70–99)
Glucose-Capillary: 139 mg/dL — ABNORMAL HIGH (ref 70–99)

## 2020-11-28 LAB — BASIC METABOLIC PANEL
Anion gap: 4 — ABNORMAL LOW (ref 5–15)
BUN: 18 mg/dL (ref 6–20)
CO2: 28 mmol/L (ref 22–32)
Calcium: 9.2 mg/dL (ref 8.9–10.3)
Chloride: 104 mmol/L (ref 98–111)
Creatinine, Ser: 0.89 mg/dL (ref 0.44–1.00)
GFR, Estimated: >60 mL/min (ref >60)
Glucose, Bld: 157 mg/dL — ABNORMAL HIGH (ref 70–99)
Potassium: 4.4 mmol/L (ref 3.5–5.1)
Sodium: 136 mmol/L (ref 135–145)

## 2020-11-28 NOTE — TOC Progression Note (Addendum)
Transition of Care [facility]) - Progression Note    Patient Details  Name: Jennifer [name] MRN: [id number] Date of Birth: 1970/11/01  Transition of Care [facility]) CM/SW Contact  Milinda Antis, [place] Phone Number: 11/28/2020, 2:18 PM  Clinical Narrative:    11/28/2020-  [facility] to ask if they could accept the patient.  There was no answer.  CSW left a message requesting a returned call.  CSW called Debbie with [employer].  CSW was informed that the facility would submit for insurance pre-authorization.  If insurance approves, the facility may be able to accept.   CSW received a returned call from [place] and was informed that because the patient had medicaid (not long term medicaid), the patient has to pay one month in advance ($6,000) to come to the facility.    Expected Discharge Plan: [place] Barriers to Discharge: Continued Medical Work up, Ship broker  Expected Discharge Plan and Services Expected Discharge Plan: [place] Choice: [place] Living arrangements for the past 2 months: Apartment                                       Social Determinants of Health (SDOH) Interventions    Readmission Risk Interventions Readmission Risk Prevention Plan 04/12/2020  Transportation Screening Complete  PCP or Specialist Appt within 3-5 Days Complete  HRI or [place] Complete  Social Work Consult for [place] Planning/Counseling Complete  Palliative Care Screening Not Applicable  Medication Review Press photographer) Complete  Some recent data might be hidden

## 2020-11-28 NOTE — Plan of Care (Signed)
  Problem: Education: Goal: Knowledge of General Education information will improve Description: Including pain rating scale, medication(s)/side effects and non-pharmacologic comfort measures Outcome: Progressing   Problem: Health Behavior/Discharge Planning: Goal: Ability to manage health-related needs will improve Outcome: Progressing   Problem: Clinical Measurements: Goal: Ability to maintain clinical measurements within normal limits will improve Outcome: Progressing   Problem: Nutrition: Goal: Adequate nutrition will be maintained Outcome: Progressing   Problem: Safety: Goal: Ability to remain free from injury will improve Outcome: Progressing   

## 2020-11-28 NOTE — Progress Notes (Signed)
HD#6 SUBJECTIVE:  Patient Summary: Jennifer [name] is a 50 y.o. with a pertinent PMH of poorly controlled diabetes, peripheral neuropathy, atrial fibrillation, and HFpEF, who presented with orthostatic hypotension and admitted for autonomic dysfunction.   Overnight Events: No acute events overnight.  Interim History: This is hospital day 6 for Jennifer [name]. She was evaluated at bedside; her daughter translated. Today, Jennifer [name] states she is able to walk without dizziness. She did have some left-sided CP last night but it self-resolved after falling asleep. We discussed plan to go to SNF after she is accepted to a facility and she expressed understanding.   OBJECTIVE:  Vital Signs: Vitals:   11/27/20 0600 11/27/20 0831 11/27/20 1511 11/27/20 2104  BP: (!) 141/71  (!) 155/73 (!) 159/79  Pulse: 83  95 90  Resp:  '17 18 16  '$ Temp:  98.5 F (36.9 C) 98.3 F (36.8 C) 98.5 F (36.9 C)  TempSrc:  Oral Oral Oral  SpO2:  100% 100% 99%  Weight:      Height:       Supplemental O2: Room Air SpO2: 99 %  Filed Weights   11/23/20 1500  Weight: 71.2 kg    No intake or output data in the 24 hours ending 11/28/20 0753 Net IO Since Admission: 1,672.63 mL [11/28/20 0753]  Physical Exam: General: Pleasant, comfortable-appearing female laying in bed. No acute distress CV: Regular rate and regular rhythm. No murmurs, rubs, or gallops. Trace bilateral lower mostly limited to feet. Pulmonary: Lungs CTAB. Normal effort. No wheezing or rales. Abdominal: Soft, nontender, nondistended. Normal bowel sounds. Extremities: Palpable radial and DP pulses. Normal ROM. Scar on R ankle s/p ORIF. Skin: Warm and dry. No obvious rash or lesions. Neuro: A&Ox3. Moves all extremities and uses walker to ambulate Psych: Normal mood and affect    ASSESSMENT/PLAN:  Assessment: Principal Problem:   Orthostatic hypotension Active Problems:   Type II diabetes mellitus (HCC)   (HFpEF) heart failure with  preserved ejection fraction (HCC)   Atrial fibrillation (HCC)   Early satiety   Lower extremity weakness   CKD (chronic kidney disease) stage 3, GFR 30-59 ml/min (HCC)   Plan: #Autonomic dysfunction #Orthostatic hypotension Patient has had multiple episodes of hypotension that have been worked up several times and have all been attributed to autonomic dysfunction and poor oral intake. Discussed with the patient that diabetes mellitis is the most common cause of her symptoms at this time. She has had no episodes of dizziness/lightheadedness while ambulating recently. - Encourage patient to work with PT/OT, plan for SNF on discharge - Compression stockings  - Continue midodrine 2.'5mg'$  BID - Likely will need to tolerate higher blood pressures while seated  #Atrial fibrillation with RVR Patient has a known history of atrial fibrillation and has been back in normal sinus rhythm since 8/5. - Continue Eliquis 5 mg BID - Hold off on metoprolol, will start when patient is not orthostatic   #Type 2 diabetes mellitus with diabetic neuropathy Patient endorses adherence to her diabetic management including NovoLOG 70-30 30 units in the morning and 15 units in the evening, as well as metformin 1,000 mg twice daily. HbA1c 13.5% as of 8/3. She has been managed with metformin, linagliptin, Levemir, and SSI since admission. Blood sugars have been ranging from 90s-140s in the 24 hours, with one glucose measurement of 201.  - Continue Linagliptin 5 mg daily, metformin 1,000 mg twice daliy, Levemir to 15 units nightly and novoLOG SS injection three times daily with  meals - Will optimize diabetes regimen upon discharge   Best Practice: Diet: carb modified diet IVF: none VTE: Apixaban Code: Full Therapy Recs: PT/OT recommending SNF, placement pending Family Contact: daughter, at bedside. DISPO: Anticipated discharge in 1-2 days SNF pending placement.   Signature: Buddy Duty, D.O.  Internal Medicine  Resident, PGY-1 [facility]  Pager: [phone] 7:53 AM, 11/28/2020   Please contact the on call pager after 5 pm and on weekends at [phone].

## 2020-11-28 NOTE — Progress Notes (Signed)
Physical Therapy Treatment Patient Details Name: Novis Kempe MRN: [id number] DOB: [birth year] Today's Date: 11/28/2020    History of Present Illness Pt is a 50yo female admitted 8/3 secondary to dizziness, orthostatic hypotension and uncontrolled diabetes. PMH: HFpEF, DM, afib, hypotension, early satiety, lower extremity weakness, history of COVID, MRSA.    PT Comments    Pt is progressing toward her goals. Pt performed standing marches at EOB utilizing RW as well as sidesteps for repositioning in bed. Pt min guard using RW for safety for all mobility tasks. Orthostatics were taken (see chart below) and were positive but patient asymptomatic. Unable to check further blood pressures as dynamap malfunctioned. Further treatment deferred out of an abundance of caution as patient was previously orthostatic. Pt and daughter educated on importance of ankle pumps and to put bed in chair position to help with BP stabilization. Current plan remains appropriate. We will continue following her acutely to promote independence with functional mobility.    11/28/20 1509 11/28/20 1514 11/28/20 1515  Orthostatic Lying   BP- Lying (!) 147/93  --   --   Pulse- Lying 88  --   --   Orthostatic Sitting  BP- Sitting 116/64 95/56 123/62  Pulse- Sitting 86  --   --   Orthostatic Standing at 0 minutes  BP- Standing at 0 minutes 106/48  --   --   Pulse- Standing at 0 minutes 80  --   --   Orthostatic Standing at 3 minutes  BP- Standing at 3 minutes 91/57  --   --   Pulse- Standing at 3 minutes 87  --   --     Follow Up Recommendations  SNF     Equipment Recommendations  Rolling walker with 5" wheels    Recommendations for Other Services       Precautions / Restrictions Precautions Precautions: Fall Precaution Comments: Orthostatic hypotension Restrictions Weight Bearing Restrictions: No    Mobility  Bed Mobility Overal bed mobility: Needs Assistance Bed Mobility: Sit to Supine;Supine to Sit      Supine to sit: Min guard Sit to supine: Min guard   General bed mobility comments: Pt required min guard for safety only.    Transfers Overall transfer level: Needs assistance Equipment used: Rolling walker (2 wheeled) Transfers: Sit to/from Stand Sit to Stand: Supervision;Min guard         General transfer comment: Pt required min guard for safety. Tactile cuing for hand placement during transfer. Pt with positive orthostatics in standing initially, so returned to sitting. Upon second stand, BP machine malfunctioned so did not get accurate read. Pt was able to march in place and side step at EOB without symptoms. Further mobility deferred out of precaution as pt was previously orthostatic.  Ambulation/Gait             General Gait Details: Deferred due to positive orthostatics.   Stairs             Wheelchair Mobility    Modified Rankin (Stroke Patients Only)       Balance Overall balance assessment: Needs assistance Sitting-balance support: Feet supported Sitting balance-Leahy Scale: Fair     Standing balance support: During functional activity;Single extremity supported;Bilateral upper extremity supported Standing balance-Leahy Scale: Poor Standing balance comment: Pt utilizes BUE on RW during standing; can reduce to single UE during BP measurements without deficit.  Cognition Arousal/Alertness: Awake/alert Behavior During Therapy: WFL for tasks assessed/performed Overall Cognitive Status: Within Functional Limits for tasks assessed                                 General Comments: pts daughter interpreting session, pt able to understand and respond some in [place] Exercises - Lower Extremity Ankle Circles/Pumps: AROM;Both;5 reps Long Arc Quad: AROM;20 reps;Both;Seated (Did not acheive full knee flexion between sets) Hip Flexion/Marching: AROM;Standing;20 reps;Both (Pt hip  flexion was poor though she did acheive liftoff of foot from floor. Min guard for safety)    General Comments        Pertinent Vitals/Pain Pain Assessment: No/denies pain    Home Living                      Prior Function            PT Goals (current goals can now be found in the care plan section) Acute Rehab PT Goals Patient Stated Goal: to get better PT Goal Formulation: With patient/family Time For Goal Achievement: 12/07/20 Potential to Achieve Goals: Good Progress towards PT goals: Progressing toward goals    Frequency    Min 2X/week      PT Plan Current plan remains appropriate    Co-evaluation              AM-PAC PT "6 Clicks" Mobility   Outcome Measure  Help needed turning from your back to your side while in a flat bed without using bedrails?: A Little Help needed moving from lying on your back to sitting on the side of a flat bed without using bedrails?: A Little Help needed moving to and from a bed to a chair (including a wheelchair)?: A Little Help needed standing up from a chair using your arms (e.g., wheelchair or bedside chair)?: A Little Help needed to walk in hospital room?: A Little Help needed climbing 3-5 steps with a railing? : A Lot 6 Click Score: 17    End of Session Equipment Utilized During Treatment: Gait belt Activity Tolerance: Treatment limited secondary to medical complications (Comment) (Orthostatic hypotension) Patient left: in bed;with call bell/phone within reach;with bed alarm set;with family/visitor present (Daughter) Nurse Communication: Mobility status PT Visit Diagnosis: Other abnormalities of gait and mobility (R26.89)     Time: [id number] PT Time Calculation (min) (ACUTE ONLY): 24 min  Charges:  $Therapeutic Activity: 23-37 mins                     Dawayne Cirri, SPT   Dawayne Cirri 11/28/2020, 5:01 PM

## 2020-11-29 ENCOUNTER — Other Ambulatory Visit (HOSPITAL_COMMUNITY): Payer: Self-pay

## 2020-11-29 ENCOUNTER — Telehealth: Payer: Self-pay

## 2020-11-29 DIAGNOSIS — E1143 Type 2 diabetes mellitus with diabetic autonomic (poly)neuropathy: Secondary | ICD-10-CM

## 2020-11-29 LAB — BASIC METABOLIC PANEL
Anion gap: 4 — ABNORMAL LOW (ref 5–15)
BUN: 24 mg/dL — ABNORMAL HIGH (ref 6–20)
CO2: 26 mmol/L (ref 22–32)
Calcium: 9 mg/dL (ref 8.9–10.3)
Chloride: 104 mmol/L (ref 98–111)
Creatinine, Ser: 0.87 mg/dL (ref 0.44–1.00)
GFR, Estimated: >60 mL/min (ref >60)
Glucose, Bld: 143 mg/dL — ABNORMAL HIGH (ref 70–99)
Potassium: 4.3 mmol/L (ref 3.5–5.1)
Sodium: 134 mmol/L — ABNORMAL LOW (ref 135–145)

## 2020-11-29 LAB — GLUCOSE, CAPILLARY
Glucose-Capillary: 138 mg/dL — ABNORMAL HIGH (ref 70–99)
Glucose-Capillary: 89 mg/dL (ref 70–99)
Glucose-Capillary: 208 mg/dL — ABNORMAL HIGH (ref 70–99)

## 2020-11-29 MED ORDER — METOCLOPRAMIDE HCL 5 MG PO TABS
5.0000 mg | ORAL_TABLET | Freq: Three times a day (TID) | ORAL | 0 refills | Status: DC
  Filled 2020-11-29: qty 120, 30d supply, fill #0

## 2020-11-29 MED ORDER — LINAGLIPTIN 5 MG PO TABS
5.0000 mg | ORAL_TABLET | Freq: Every day | ORAL | 0 refills | Status: DC
  Filled 2020-11-29: qty 30, 30d supply, fill #0

## 2020-11-29 MED ORDER — MIDODRINE HCL 5 MG PO TABS
5.0000 mg | ORAL_TABLET | Freq: Two times a day (BID) | ORAL | 0 refills | Status: AC
  Filled 2020-11-29: qty 60, 30d supply, fill #0

## 2020-11-29 MED ORDER — METFORMIN HCL ER 500 MG PO TB24
1000.0000 mg | ORAL_TABLET | Freq: Two times a day (BID) | ORAL | 0 refills | Status: DC
  Filled 2020-11-29: qty 120, 30d supply, fill #0

## 2020-11-29 MED ORDER — INSULIN ASPART 100 UNIT/ML FLEXPEN
3.0000 [IU] | PEN_INJECTOR | Freq: Three times a day (TID) | SUBCUTANEOUS | 0 refills | Status: DC
  Filled 2020-11-29: qty 3, 27d supply, fill #0

## 2020-11-29 MED ORDER — PENTIPS 32G X 4 MM MISC
3 refills | Status: DC
  Filled 2020-11-29: qty 100, 30d supply, fill #0

## 2020-11-29 MED ORDER — APIXABAN 5 MG PO TABS
5.0000 mg | ORAL_TABLET | Freq: Two times a day (BID) | ORAL | 0 refills | Status: DC
  Filled 2020-11-29: qty 60, 30d supply, fill #0

## 2020-11-29 MED ORDER — INSULIN DETEMIR 100 UNIT/ML FLEXPEN
15.0000 [IU] | PEN_INJECTOR | Freq: Every day | SUBCUTANEOUS | 0 refills | Status: DC
  Filled 2020-11-29: qty 6, 30d supply, fill #0

## 2020-11-29 MED ORDER — MIDODRINE HCL 5 MG PO TABS
5.0000 mg | ORAL_TABLET | Freq: Two times a day (BID) | ORAL | Status: DC
  Administered 2020-11-29: 5 mg via ORAL
  Filled 2020-11-29: qty 1

## 2020-11-29 NOTE — Progress Notes (Signed)
Physical Therapy Treatment Patient Details Name: Jennifer [name] MRN: [id number] DOB: October 13, 1970 Today's Date: 11/29/2020    History of Present Illness Pt is a 50yo female admitted 8/3 secondary to dizziness, orthostatic hypotension and uncontrolled diabetes. PMH: HFpEF, DM, afib, hypotension, early satiety, lower extremity weakness, history of COVID, MRSA.    PT Comments    Pt is progressing toward her goals. Pt was seated at EOB upon entry and motivated for therapy. Did note drop in BP as seen below but pt asymptomatic. Pt required supervision for transfers and min guard for ambulation, both for safety only. Required seated rest break as she reported fatigue and "heavy legs." Seated BP post ambulation was measured and WNL. Pt displays bilateral LE weakness upon muscle testing with R > L. Pt and daughter educated on use of RW at home and [facility] for community distances for safety. Discharge recommendations updated to outpatient PT per patient and family request. We will continue to follow this patient acutely to promote independence with functional mobility.   11/29/20 0925 11/29/20 0930  Orthostatic Sitting  BP- Sitting 130/65 151/67 (after ambulation)  Pulse- Sitting 80 83  Orthostatic Standing at 0 minutes  BP- Standing at 0 minutes 109/60  --   Pulse- Standing at 0 minutes 81  --     Follow Up Recommendations  Outpatient PT;Supervision for mobility/OOB     Equipment Recommendations  None recommended by PT (Pt has WC and RW at home)    Recommendations for Other Services       Precautions / Restrictions Precautions Precautions: Fall Precaution Comments: Orthostatic hypotension Restrictions Weight Bearing Restrictions: No    Mobility  Bed Mobility               General bed mobility comments: Pt seated EOB upon entry    Transfers Overall transfer level: Needs assistance Equipment used: Rolling walker (2 wheeled) Transfers: Sit to/from Stand Sit to Stand: Supervision          General transfer comment: Pt required supervision for safety only.  Ambulation/Gait Ambulation/Gait assistance: Min guard Gait Distance (Feet): 100 Feet Assistive device: Rolling walker (2 wheeled) Gait Pattern/deviations: Decreased step length - left;Decreased stance time - right;Decreased stride length;Decreased dorsiflexion - left;Decreased dorsiflexion - right Gait velocity: decreased   General Gait Details: Pt ambulated with step-through pattern with decreased ankle DF bilaterally leading to some foot drag, R>L. Right foot displays increased ER, suspect this is secondary to decreased DF or possibly hip flexor weakness. Pt reported that her RLE is weaker since her recent fall and this was confirmed via MMT. No overt LOB noted.   Stairs             Wheelchair Mobility    Modified Rankin (Stroke Patients Only)       Balance Overall balance assessment: Needs assistance Sitting-balance support: Feet supported Sitting balance-Leahy Scale: Fair Sitting balance - Comments: Pt able to lean laterally to don gown while in seated without LOB or UE support.   Standing balance support: During functional activity;Bilateral upper extremity supported;No upper extremity supported Standing balance-Leahy Scale: Fair Standing balance comment: Pt utilizes BUE on RW during standing; can reduce to no UE during BP measurements without deficit or sway noted.                            Cognition Arousal/Alertness: Awake/alert Behavior During Therapy: WFL for tasks assessed/performed Overall Cognitive Status: Within Functional Limits for tasks assessed  General Comments: Offered interprative services, pt's daughter requested to interpret session. Pt able to understand and respond some in [place] Comments General comments (skin integrity, edema, etc.): MMT of LLE grossly 3+/5; RLE 3/5. Right hip  flexors especially weak and unable to hold against PT pressure. Pt unable to reach neutral during ankle DF bilaterally. BUE ROM WFL and grossly 4/5.      Pertinent Vitals/Pain Pain Assessment: No/denies pain    Home Living                      Prior Function            PT Goals (current goals can now be found in the care plan section) Acute Rehab PT Goals Patient Stated Goal: to go home PT Goal Formulation: With patient/family Time For Goal Achievement: 12/07/20 Potential to Achieve Goals: Good Progress towards PT goals: Progressing toward goals    Frequency    Min 3X/week      PT Plan Discharge plan needs to be updated;Frequency needs to be updated;Equipment recommendations need to be updated    Co-evaluation              AM-PAC PT "6 Clicks" Mobility   Outcome Measure  Help needed turning from your back to your side while in a flat bed without using bedrails?: None Help needed moving from lying on your back to sitting on the side of a flat bed without using bedrails?: None Help needed moving to and from a bed to a chair (including a wheelchair)?: A Little Help needed standing up from a chair using your arms (e.g., wheelchair or bedside chair)?: A Little Help needed to walk in hospital room?: A Little Help needed climbing 3-5 steps with a railing? : A Lot 6 Click Score: 19    End of Session Equipment Utilized During Treatment: Gait belt Activity Tolerance: Patient tolerated treatment well Patient left: in chair;with call bell/phone within reach;with family/visitor present (Pt's daughter in room) Nurse Communication: Mobility status PT Visit Diagnosis: Other abnormalities of gait and mobility (R26.89)     Time: [id number] PT Time Calculation (min) (ACUTE ONLY): 23 min  Charges:  $Gait Training: 8-22 mins $Therapeutic Activity: 8-22 mins                     Dawayne Cirri, SPT   Dawayne Cirri 11/29/2020, 10:40 AM

## 2020-11-29 NOTE — Progress Notes (Signed)
Patient was discharged home with daughter and family. Patient was in stable condition and rolled down by staff in a wheelchair. All medication and discharge paperwork was explained to patient and mostly daughter. Daughter states that they understand. Patient was advised to call physician office if any questions or misunderstanding came about.

## 2020-11-29 NOTE — Discharge Summary (Signed)
Name: Jennifer [name] MRN: [id number] DOB: 01-13-1971 50 y.o. PCP: Jose Persia, MD  Date of Admission: 11/22/2020  7:22 PM Date of Discharge:  11/29/2020 Attending Physician: Dr. 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 ([place]) Active Problems:   Type II diabetes mellitus ([place])   (HFpEF) heart failure with preserved ejection fraction (HCC)   Atrial fibrillation (HCC)   Early satiety   Orthostatic hypotension   Lower extremity weakness   CKD (chronic kidney disease) stage 3, GFR 30-59 ml/min (HCC)    DISCHARGE MEDICATIONS:   Allergies as of 11/29/2020   No Known Allergies      Medication List     STOP taking these medications    Janumet 50-1000 MG tablet Generic drug: sitaGLIPtin-metformin   NovoLOG Mix 70/30 FlexPen (70-30) 100 UNIT/ML FlexPen Generic drug: insulin aspart protamine - aspart       TAKE these medications    Accu-Chek FastClix Lancets Misc Check blood sugar 4 times a day   Accu-Chek Guide test strip Generic drug: glucose blood CHECK BLOOD SUGAR 4 TIMES PER DAY   Accu-Chek Guide w/Device Kit 1 each by Does not apply route 4 (four) times daily.   apixaban 5 MG Tabs tablet Commonly known as: ELIQUIS Take 1 tablet (5 mg total) by mouth 2 (two) times daily.   B-D UF III MINI PEN NEEDLES 31G X 5 MM Misc Generic drug: Insulin Pen Needle Use pen needle daily for injections   brimonidine 0.2 % ophthalmic solution Commonly known as: ALPHAGAN Place 1 drop into the left eye every 8 (eight) hours.   diclofenac Sodium 1 % Gel Commonly known as: VOLTAREN Apply 2 g topically 4 (four) times daily as needed (foot pain).   dorzolamide-timolol 22.3-6.8 MG/ML ophthalmic solution Commonly known as: COSOPT Place 1 drop into the left eye 2 (two) times daily.   insulin aspart 100 UNIT/ML  FlexPen Commonly known as: NOVOLOG Inject 3 Units into the skin 3 (three) times daily with meals.   insulin detemir 100 UNIT/ML injection Commonly known as: LEVEMIR Inject 0.15 mLs (15 Units total) into the skin at bedtime for 30 doses.   linagliptin 5 MG Tabs tablet Commonly known as: TRADJENTA Take 1 tablet (5 mg total) by mouth daily. Start taking on: November 30, 2020   metFORMIN 500 MG 24 hr tablet Commonly known as: GLUCOPHAGE-XR Take 2 tablets (1,000 mg total) by mouth in the morning and at bedtime. What changed:  how much to take when to take this   metoCLOPramide 5 MG tablet Commonly known as: Reglan Take 1 tablet (5 mg total) by mouth 4 (four) times daily -  before meals and at bedtime. What changed: when to take this   midodrine 5 MG tablet Commonly known as: PROAMATINE Take 1 tablet (5 mg total) by mouth 2 (two) times daily with a meal.        DISPOSITION AND FOLLOW-UP:  Jennifer [name] was discharged from [facility] in Stable condition. At the hospital follow up visit please address:  Follow-up Recommendations: Consults: Dietary training Labs: Basic Metabolic Profile and Hgb N2T Medications: NEW: Midorine 25m BID, Eliquis 548mBID, Levemir 15U nightly, Novolog 3U TID WC, metformin 100036mID, Linagliptin 5mg45mFollow-up Appointments: Internal Medicine Clinic appt 8/17 at 10:15am with Dr. SteeCourt Joyllow-up Information     SteeMadalyn Rob. GoDaphane Shepherd  on 12/06/2020.   Specialty: Internal Medicine Why: 10:15am appointment with Dr. Court Joy for hospital follow up Contact information: [address] [place] [address] [phone]                 HOSPITAL COURSE:  Patient Summary: Jennifer [name] is a 50 y.o. with a pertinent PMH of poorly uncontrolled diabetes, peripheral neuropathy, atrial fibrillation, and HFpEF, who presented directly from the [facility] for orthostatic hypotension.  #Orthostatic hypotension #Autonomic  dysfunction Patient has had multiple episodes of hypotension that have been worked up several times and have all been attributed to autonomic dysfunction and poor oral intake. Initial orthostatics prior to admission were: lying 141/78, sitting 114/67, and standing 84/49. Patient received 1L LR bolus. Had episode of dizziness in the afternoon on 8/4 and BP dropped to 70s/40s while standing and it increased to 110/70 when laying back down. More IV fluids were ordered, LR 100 cc/hr x10 hour.  ACTH stimstimulation test performed on 8/5 and patient had appropriate rises in her cortisol levels, thus adrenal insufficiency is ruled out. Patient has no other signs/symptoms of neurodegenerative disorders such as Parkinson's or Lewy Body dementia, thus this is likely not the culprit of her orthostasis. Likely not to be autonomic dysfunction secondary to impairment of ganglionic nicotinic acetylcholine receptors as patient does not have any other sxs such as neurogenic bladder, dry eye, or dry mouth. Paraneoplastic syndrome also not likely as no malignancy has been noted on imaging. Familial dysautonomia not likely the cause as this would have been expressed at birth with impaired pain and temperature sensation. Volume depletion was suspected initially and patient responded well to fluids, however, she is euvolemic and remains orthostatic. Patient is also not on any medications that could likely cause autonomic dysfunction. Diabetes mellitis is the most common cause of autonomic neuropathy and is likely the cause of orthostatic hypotension in this patient, as her diabetes is not well controlled, with A1c of 13.5%. Patient remained orthostatic on 8/5 and was bolused with another 1L of LR. Patient was again orthostatic on 8/6, however, fluids were not given as the patient appears euvolemic and is starting to develop some pitting edema in her feet. Midodrine started on 8/7 at 2.93m BID as the patient remains orthostatic and  symptomatic. Had one episode of hypertension after starting midodrine, however, the patient remained asymptomatic. Likely will need to tolerate higher blood pressures while seated. Patient denied any dizziness/lightheadedness while ambulating and states that she feels better as of 8/9. On the day of discharge she stated that she was able to walk around more without having any lightheadedness. Midodrine was increased to 552mBID as she remained orthostatic.    #Atrial fibrillation with RVR Patient has a known history of atrial fibrillation and had an irregular, tachycardic rate and irregular rhythm upon auscultation. She has not been taking home anticoagulants. Started on Eliquis 5 mg BID during admission and should continue on this upon discharge.    #Type 2 diabetes mellitus with diabetic neuropathy Patient endorses adherence to her diabetic management including NovoLOG 70-30 30 units in the morning and 15 units in the evening, as well as metformin 1,000 mg twice daily. HbA1c was 13.5% as of 8/3. Was supposed to start on Janumet as of yesterday. Will continue her on metformin, linagliptin, Levemir, and SSI. Blood sugars have been ranging from 150s-180s. Continued Linagliptin 5 mg daily, metformin 1,000 mg twice daily, and increased Levemir to 15 units nightly on 8/5 and novoLOG SS  injection three times daily with meals. Will be discharged on metformin 1059m, linagliptin 521m and insulin determir 15U nightly, insulin aspart 3U TID with meals. Advised to follow up with diabetes coordinator at the internal medicine clinic.    #Chronic HFpEF Diagnosed via TTE October 2021, EF 50-55%. Patient appears clinically hypovolemic secondary to poor oral intake. Received LR 1,000 mL bolus and monitored for any signs of volume overload.   #CKD stage 3a Initial labs showed BUN/Cr 27/1.18 and GFR 57. Kidney function remains stable after receiving IV fluids.    #Early satiety Likely diagnosis of gastroparesis likely  a result of poorly controlled diabetes although a gastric emptying study has never been performed. She has been prescribed Reglan in the past, however has not been taking it. Received Reglan 5 mg TID   #Lower extremity weakness Patient has a history of diabetic neuropathy and a recent surgery on her right ankle after a closed fracture. She reports continued weakness and pain in the ankle and states that it feels heavy. She had decreased strength in her right lower extremity as compared to the left. Vitamin B12 level 254 and received 1000 mcg B12 injection. PT/OT evaluated and treated the patient and both recommended SNF upon discharge, however, she improved during the course of her admission and outpatient physical therapy was thus recommended.     DISCHARGE INSTRUCTIONS:   Discharge Instructions     Amb Referral to Nutrition and Diabetic Education   Complete by: As directed    Ambulatory referral to Physical Therapy   Complete by: As directed        SUBJECTIVE:  This is hospital day 7 for Jennifer [name] who was seen and evaluated at the bedside. Sitting up at edge of bed. She is able to walk to the bathroom and ambulate more without significant dizziness. Was able to work with PT/OT. Discussed discharge to home with outpatient physical therapy for which patient is agreeable.   Discharge Vitals:   BP (!) 152/70 (BP Location: Left Arm)   Pulse 84   Temp 97.7 F (36.5 C) (Oral)   Resp 18   Ht '5\' 6"'  (1.676 m)   Wt 71.2 kg   LMP 08/21/2016 (Exact Date)   SpO2 100%   BMI 25.34 kg/m   OBJECTIVE:  General: Pleasant, comfortable-appearing female laying in bed. No acute distress CV: Regular rate and regular rhythm. No murmurs, rubs, or gallops. No LE edema. Pulmonary: Lungs CTAB. Normal effort. No wheezing or rales. Abdominal: Soft, nontender, nondistended. Normal bowel sounds. Extremities: Palpable radial and DP pulses. Normal ROM. Scar on R ankle s/p ORIF. Skin: Warm and dry. No  obvious rash or lesions. Neuro: A&Ox3. Moves all extremities and uses walker to ambulate Psych: Normal mood and affect  Pertinent Labs, Studies, and Procedures:  CBC Latest Ref Rng & Units 11/23/2020 11/22/2020 11/07/2020  WBC 4.0 - 10.5 K/uL 6.9 5.5 4.3  Hemoglobin 12.0 - 15.0 g/dL 12.1 12.2 12.2  Hematocrit 36.0 - 46.0 % 36.1 37.7 37.5  Platelets 150 - 400 K/uL 155 180 160    CMP Latest Ref Rng & Units 11/29/2020 11/28/2020 11/27/2020  Glucose 70 - 99 mg/dL 143(H) 157(H) 144(H)  BUN 6 - 20 mg/dL 24(H) 18 17  Creatinine 0.44 - 1.00 mg/dL 0.87 0.89 0.83  Sodium 135 - 145 mmol/L 134(L) 136 137  Potassium 3.5 - 5.1 mmol/L 4.3 4.4 4.4  Chloride 98 - 111 mmol/L 104 104 105  CO2 22 - 32 mmol/L 26 28 25  Calcium 8.9 - 10.3 mg/dL 9.0 9.2 9.3  Total Protein 6.5 - 8.1 g/dL - - -  Total Bilirubin 0.3 - 1.2 mg/dL - - -  Alkaline Phos 38 - 126 U/L - - -  AST 15 - 41 U/L - - -  ALT 0 - 44 U/L - - -     Signed: Buddy Duty, D.O.  Internal Medicine Resident, PGY-1 [facility]  Pager: [phone] 11:10 AM, 11/29/2020

## 2020-11-29 NOTE — Telephone Encounter (Signed)
Hospital TOC per Dr. Raymondo Band discharge 11/29/2020, appt 12/06/2020.

## 2020-11-29 NOTE — Progress Notes (Signed)
Nutrition Follow-up  DOCUMENTATION CODES:   Not applicable  INTERVENTION:   -Continue Glucerna Shake po BID, each supplement provides 220 kcal and 10 grams of protein  -Continue Ensure Max po daily, each supplement provides 150 kcal and 30 grams of protein.   -Continue MVI with minerals daily -Provided "Carbohydrate Counting for People with Diabetes" handout from [employer]; attached to AVS/ discharge summary and also provided handout for pt daughter on bedside table in room -Referred to outpatient diabetes education (RD, [facility] at [place])  NUTRITION DIAGNOSIS:   Inadequate oral intake related to chronic illness, decreased appetite (gastroparesis) as evidenced by per patient/family report.  Ongoing  GOAL:   Patient will meet greater than or equal to 90% of their needs  Progressing   MONITOR:   PO intake, Supplement acceptance, Labs, Weight trends, Skin, I & O's  REASON FOR ASSESSMENT:   Malnutrition Screening Tool    ASSESSMENT:   Jennifer [name] is a 50 y.o. female with past medical history of type 2 diabetes, HFpEF, atrial fibrillation, hypotension, early satiety, lower extremity weakness, who presents as direct admit from Internal Medicine Clinic due to orthostatic hypotension and uncontrolled diabetes.  Reviewed I/O's: +240 ml x 24 hours and +1.9 L since admission   Spoke with pt daughter via telephone. She reports that pt's appetite continues to improve. Noted meal completion 60-100%. Observed breakfast tray- pt consumed 100% of fruit and milk. She continues to consume Glucerna and Ensure Max supplements.   Per daughter, she is requesting RD to come to her home and evaluate foods in her kitchen. RD informed daughter that this is not a service that is provided, but was happy to discuss further DM questions and concerns with her. She reports that she would like a handout to refer to once pt discharges home. RD provided "Carbohydrate  Counting for People with Diabetes" handout from [facility] Nutrition Care Manual; left copy in room on bedside table and also attached to AVS/ discharge summary. RD also referred pt to Debera Lat, RD, [place] at [place].   Labs reviewed: Na: 134, CBGS: 86-208.    Diet Order:   Diet Order             Diet Carb Modified Fluid consistency: Thin; Room service appropriate? Yes  Diet effective now                   EDUCATION NEEDS:   No education needs have been identified at this time  Skin:  Skin Assessment: Reviewed RN Assessment  Last BM:  11/27/20  Height:   Ht Readings from Last 1 Encounters:  11/23/20 '5\' 6"'$  (1.676 m)    Weight:   Wt Readings from Last 1 Encounters:  11/23/20 71.2 kg    Ideal Body Weight:  61.4 kg  BMI:  Body mass index is 25.34 kg/m.  Estimated Nutritional Needs:   Kcal:  [id number]  Protein:  90-105 grams  Fluid:  > 1.7 L    Loistine Chance, RD, LDN, [place] Registered Dietitian II Certified Diabetes Care and Education Specialist Please refer to [facility] for RD and/or RD on-call/weekend/after hours pager

## 2020-11-29 NOTE — TOC Benefit Eligibility Note (Signed)
Patient Teacher, English as a foreign language completed.    The patient is currently admitted and upon discharge could be taking Tradjenta (linagliptin) 5 mg.  The current 30 day co-pay is, $4.00.   The patient is insured through [place], [place] Patient Advocate Specialist [employer] Direct Number: [phone]  Fax: [phone]

## 2020-12-06 ENCOUNTER — Encounter: Payer: Medicaid Other | Admitting: Internal Medicine

## 2020-12-07 ENCOUNTER — Encounter: Payer: Medicaid Other | Admitting: Internal Medicine

## 2020-12-13 ENCOUNTER — Other Ambulatory Visit: Payer: Self-pay | Admitting: Internal Medicine

## 2020-12-13 ENCOUNTER — Telehealth: Payer: Self-pay

## 2020-12-13 NOTE — Telephone Encounter (Signed)
Requesting to speak with a nurse about getting a letter from the doctor to get help with light bills. Please call pt's daughter back.

## 2020-12-13 NOTE — Telephone Encounter (Signed)
Return call to pt's daughter, Jennifer [name] who stated pt needs help with the utility bill. Pt has not had electricity x 2 days. 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 Needs a letter as soon as possible. I will also send message to Bianca,SW for any assistance.

## 2020-12-13 NOTE — Progress Notes (Signed)
Letter sent.

## 2020-12-14 NOTE — Telephone Encounter (Signed)
Called pt's daughter about letter which is ready ; no answer and no vm.

## 2020-12-20 ENCOUNTER — Telehealth: Payer: Self-pay

## 2020-12-20 ENCOUNTER — Other Ambulatory Visit: Payer: Self-pay

## 2020-12-20 ENCOUNTER — Other Ambulatory Visit: Payer: Self-pay | Admitting: Obstetrics and Gynecology

## 2020-12-20 NOTE — Patient Outreach (Signed)
Medicaid Managed Care   Nurse Care Manager Note  12/20/2020 Name:  Jennifer [name] MRN:  [id number] DOB:  [birth year]  Jennifer [name] is an 50 y.o. year old female who is a primary patient of Jennifer Persia, MD.  The [facility] Managed Care Coordination team was consulted for assistance with:    Chronic healthcare management needs.  [name] / patient's daughter was given information about Medicaid Managed Care Coordination team services today. Jennifer [name]  [name], patient's daughter,  agreed to services and verbal consent obtained.  Engaged with patient/ patient's daughter  by telephone for follow up visit in response to provider referral for case management and/or care coordination services.   Assessments/Interventions:  Review of past medical history, allergies, medications, health status, including review of consultants reports, laboratory and other test data, was performed as part of comprehensive evaluation and provision of chronic care management services.  SDOH (Social Determinants of Health) assessments and interventions performed: SDOH Interventions    Flowsheet Row Most Recent Value  SDOH Interventions   Food Insecurity Interventions Intervention Not Indicated  Financial Strain Interventions Other (Comment)  [referral for resources]  Housing Interventions Other (Comment)  [referral for assistance]  Intimate Partner Violence Interventions Intervention Not Indicated  Physical Activity Interventions Other (Comments)  [patient deconditioned-awaiting PT]  Social Connections Interventions Intervention Not Indicated  Transportation Interventions Intervention Not Indicated       Care Plan  No Known Allergies  Medications Reviewed Today     Reviewed by Jennifer Medicus, RN (Registered Nurse) on 12/20/20 at 1439  Med List Status: <None>   Medication Order Taking? Sig Documenting Provider Last Dose Status Informant  Accu-Chek FastClix Lancets MISC [id number] No Check blood sugar 4  times a day Jennifer Persia, MD Taking Active Child  ACCU-CHEK GUIDE test strip [id number] No CHECK BLOOD SUGAR 4 TIMES PER DAY Jennifer Anis, MD Taking Active Child  apixaban (ELIQUIS) 5 MG TABS tablet [id number]  Take 1 tablet (5 mg total) by mouth 2 (two) times daily. Jennifer Heck, MD  Active   Patient not taking:  Discontinued 09/27/20 0116   Blood Glucose Monitoring Suppl (ACCU-CHEK GUIDE) w/Device KIT [id number] No 1 each by Does not apply route 4 (four) times daily. Jennifer Persia, MD Taking Active Child  brimonidine [facility]) 0.2 % ophthalmic solution [id number] No Place 1 drop into the left eye every 8 (eight) hours. [provider] 11/22/2020 Active Child  Patient not taking:  Discontinued 09/27/20 0116   Patient not taking:  Discontinued 09/27/20 0116   diclofenac Sodium (VOLTAREN) 1 % GEL [id number] No Apply 2 g topically 4 (four) times daily as needed (foot pain). [provider] Past Week Active Child  dorzolamide-timolol (COSOPT) 22.3-6.8 MG/ML ophthalmic solution [id number] No Place 1 drop into the left eye 2 (two) times daily. [provider] 11/22/2020 Active Child  Patient not taking:  Discontinued 09/27/20 0116   insulin aspart (NOVOLOG) 100 UNIT/ML FlexPen [id number]  Inject 3 Units into the skin 3 (three) times daily with meals. Jennifer Heck, MD  Active   insulin detemir (LEVEMIR) 100 UNIT/ML FlexPen [id number]  Inject 15 Units into the skin at bedtime Jennifer Heck, MD  Active   Insulin Pen Needle (B-D UF III MINI PEN NEEDLES) 31G X 5 MM MISC [id number] No Use pen needle daily for injections Jennifer Anis, MD Taking Active Child  Insulin Pen Needle (PENTIPS) 32G X 4 MM MISC [id number]  Use as directed with insulin pen Jennifer Heck, MD  Active  linagliptin (TRADJENTA) 5 MG TABS tablet [id number]  Take 1 tablet (5 mg total) by mouth daily. Jennifer Heck, MD  Active   metFORMIN (GLUCOPHAGE-XR) 500 MG 24 hr tablet [id number]  Take 2 tablets (1,000 mg total) by mouth in  the morning and at bedtime. Jennifer Heck, MD  Active   metoCLOPramide (REGLAN) 5 MG tablet [id number]  Take 1 tablet (5 mg total) by mouth 4 (four) times daily -  before meals and at bedtime. Jennifer Heck, MD  Active   midodrine (PROAMATINE) 5 MG tablet [id number]  Take 1 tablet (5 mg total) by mouth 2 (two) times daily with a meal. 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 ([place]) 11/29/2020   Orthostatic hypotension 11/22/2020   Lower extremity weakness 11/22/2020   CKD (chronic kidney disease) stage 3, GFR 30-59 ml/min ([place]) 11/22/2020   Depression 08/22/2020   Weight loss, non-intentional 07/04/2020   Adult failure to thrive 07/04/2020   Nausea & vomiting 06/28/2020   Early satiety 05/31/2020   UTI (urinary tract infection) 05/16/2020   Elevated liver enzymes 05/15/2020   Closed trimalleolar fracture of right ankle 05/02/2020   Cellulitis (Left 2nd toe) 03/27/2020   Cellulitis of left foot 03/17/2020   AKI (acute kidney injury) ([place]) 03/14/2020   Right ankle injury 09/28/2019   Bilateral lower extremity edema 06/07/2019   Atrial fibrillation (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  Care Plan : General Plan of Care (Adult)  Updates made by Jennifer Medicus, RN since 12/20/2020 12:00 AM     Problem: Health Promotion or Disease Self-Management (General Plan of Care)   Priority: High  Onset Date: 08/29/2020     Long-Range Goal: Self-Management Plan Developed   Start Date: 08/29/2020  Expected End Date: 03/22/2021  Recent Progress: Not on track  Priority: High  Note:   Current Barriers:  Ineffective Self Health Maintenance Patient with diabetes, hypertension, prior ankle fracture with deconditioning, depression and ? 60 pound weight loss in 4 months.   Update 12/20/20: Patient's daughter states patient is eating better now.  Patient hospitalized approximately 2 weeks ago, awaiting outpatient PT. Currently UNABLE TO independently self manage needs related to chronic health conditions.  Knowledge Deficits related to short term plan for care coordination needs and long term plans for chronic disease management needs Nurse Case Manager Clinical Goal(s):  patient will work with care management team to address care coordination and chronic disease management needs  related to Disease Management Educational Needs Care Coordination Medication Management and Education Medication Reconciliation Medication Assistance  Psychosocial Support Mental Health Counseling   Interventions:  Evaluation of current treatment plan and patient's adherence to plan as established by provider. Reviewed medications with patient/patient's daughter. Collaborated with Pharmacy-new mediations added. Pharmacy referral for medication review. Collaborated with Social Work regarding depression.  Referral for financial resources-needs rent assistance. Discussed plans with patient for  ongoing care management follow up and provided patient with direct contact information for care management team Reviewed scheduled/upcoming provider appointments.  Update 09/29/20:  PT has not started yet, Dexcom not working yet-encouraged patient's daughter to schedule appointment with provider for PT follow up and check Dexcom-also nutrition referral. Update 12/20/20:  patient's daughter called PCP office today to schedule appointment and to follow up on outpatient PT ordered in the hospital and to follow up on dietary referral. Social Work referral for depression. Update 09/29/20:  Patient met with SW and has therapy appointment scheduled. Update 12/20/20:  Patient has not attended therapy appointment yet-will follow up with SW. Self Care Activities:  Patient will self administer medications as prescribed Patient will attend all scheduled provider appointments Patient will call pharmacy for medication refills Patient will attend church or other social activities Patient will continue to perform ADL's independently Patient will call provider office for new concerns or questions Patient will work Social Work to address care coordination needs and will continue to work with the clinical team to address health care and disease management related needs.   Patient Goals: In the next 30 days, patient will attend all provider appointments. In the next 30 days, patient will take medications as directed. In the next 30 days, patient will speak with Social Work and therapist.  Follow Up Plan: [name] has been provided with contact information for the care management team and has been advised to call with any health related questions or concerns.  The care management team will reach out to the patient/patient's daughter.again over the next 30 days.    Evidence-based guidance:   Review biopsychosocial determinants of health screens.  Review need for preventive screening based on  age, sex, family history and health history.  Determine level of modifiable health risk.  Discuss identified risks.  Identify areas where behavior change may lead to improved health.  Promote healthy lifestyle.  Evoke change talk using open-ended questions, pros and cons, as well as looking forward.  Identify and manage conditions or preconditions to reduce health risk.  Implement additional goals and interventions based on identified risk factors.      Follow Up:  Patient/Patient's daughter agrees to Care Plan and Follow-up.  Plan: The Managed Medicaid care management team will reach out to the patient / patient's daughter again over the next 30 days. and The patient / patient's daughter has been provided with contact information for the Managed Medicaid care management team and has been advised to call with any health related questions or concerns.  Date/time of next scheduled RN care management/care coordination outreach:  01/19/21 at 1245.

## 2020-12-20 NOTE — Telephone Encounter (Signed)
Return call to pt's daughter, Jennifer [name], who stated pt's feet are hurting, she cannot lift them up. And when she walks, she drags her right foot. No available appts tomorrow nor Friday (closed on Monday). Advised her to take her mother to UC to be check out which she is able to do; stated she will take her mother after she gets off work @ 5 PM. Informed if pt still needs to be seen after going to UC today, call our office back to schedule an appt for next week. Stated she understands.

## 2020-12-20 NOTE — Patient Instructions (Signed)
Hi Jennifer [name], thank you for speaking with me today.  Jennifer [name] daughter  was given information about Medicaid Managed Care team care coordination services as a part of their [place] Medicaid benefit. Jennifer [name] /patient's daughter verbally consented to engagement with the [facility] Managed Care team.   If you are experiencing a medical emergency, please call 911 or report to your local emergency department or urgent care.   If you have a non-emergency medical problem during routine business hours, please contact your provider's office and ask to speak with a nurse.   For questions related to your [facility], please call: [phone] or visit the homepage here: [email or web address]  If you would like to schedule transportation through your [facility], please call the following number at least 2 days in advance of your appointment: [phone].   Call the [place] at [phone], at any time, 24 hours a day, 7 days a week. If you are in danger or need immediate medical attention call 911.  If you would like help to quit smoking, call 1-800-QUIT-NOW 340-859-1841) OR Espaol: 1-855-Djelo-Ya (2-947-654-6503) o para ms informacin haga clic aqu or Text READY to 200-400 to register via text  Jennifer [name] - following are the goals we discussed in your visit today:   Goals Addressed             This Visit's Progress    Protect My Health       Timeframe:  Long-Range Goal Priority:  High Start Date:       08/29/20                      Expected End Date:  ongoing           Follow Up Date:  01/20/21   - schedule appointment for flu shot - schedule appointment for vaccines needed due to my age or health - schedule recommended health tests (blood work, mammogram, colonoscopy, pap test) - schedule and keep appointment for  annual check-up  Update 09/29/20:  Patient needs follow up appt with PCP following ED visit-? Nutritionist and PT referral as well as Dexcom. Update 12/20/20:  PT, dietary referrals needed-patient's daughter to follow up with PCP for appts.  Also needs BH appt-will follow up with SW   Why is this important?   Screening tests can find diseases early when they are easier to treat.  Your doctor or nurse will talk with you about which tests are important for you.  Getting shots for common diseases like the flu and shingles will help prevent them.       The patient / patient's daughter verbalized understanding of instructions provided today and declined a print copy of patient instruction materials.   The Managed Medicaid care management team will reach out to the patient /patient's daughter again over the next 30 days.  The  [name] has been provided with contact information for the Managed Medicaid care management team and has been advised to call with any health related questions or concerns.   Jennifer Raider RN, BSN [place]  [facility] - Managed Medicaid High Risk [phone].    Following is a copy of your plan of care:   Patient Care Plan: General Plan of Care (Adult)     Problem Identified: Health Promotion or Disease Self-Management (General Plan of Care)   Priority: High  Onset Date:  08/29/2020     Long-Range Goal: Self-Management Plan Developed   Start Date: 08/29/2020  Expected End Date: 03/22/2021  Recent Progress: Not on track  Priority: High  Note:   Current Barriers:  Ineffective Self Health Maintenance Patient with diabetes, hypertension, prior ankle fracture with deconditioning, depression and ? 60 pound weight loss in 4 months.   Update 12/20/20: Patient's daughter states patient is eating better now.  Patient hospitalized approximately 2 weeks ago, awaiting outpatient PT. Currently UNABLE TO  independently self manage needs related to chronic health conditions.  Knowledge Deficits related to short term plan for care coordination needs and long term plans for chronic disease management needs Nurse Case Manager Clinical Goal(s):  patient will work with care management team to address care coordination and chronic disease management needs related to Disease Management Educational Needs Care Coordination Medication Management and Education Medication Reconciliation Medication Assistance  Psychosocial Support Mental Health Counseling   Interventions:  Evaluation of current treatment plan and patient's adherence to plan as established by provider. Reviewed medications with patient/patient's daughter. Collaborated with Pharmacy-new mediations added. Pharmacy referral for medication review. Collaborated with Social Work regarding depression.  Referral for financial resources-needs rent assistance. Discussed plans with patient for ongoing care management follow up and provided patient with direct contact information for care management team Reviewed scheduled/upcoming provider appointments.  Update 09/29/20:  PT has not started yet, Dexcom not working yet-encouraged patient's daughter to schedule appointment with provider for PT follow up and check Dexcom-also nutrition referral. Update 12/20/20:  patient's daughter called PCP office today to schedule appointment and to follow up on outpatient PT ordered in the hospital and to follow up on dietary referral. Social Work referral for depression. Update 09/29/20:  Patient met with SW and has therapy appointment scheduled. Update 12/20/20:  Patient has not attended therapy appointment yet-will follow up with SW. Self Care Activities:  Patient will self administer medications as prescribed Patient will attend all scheduled provider appointments Patient will call pharmacy for medication refills Patient will attend church or other social  activities Patient will continue to perform ADL's independently Patient will call provider office for new concerns or questions Patient will work Social Work to address care coordination needs and will continue to work with the clinical team to address health care and disease management related needs.   Patient Goals: In the next 30 days, patient will attend all provider appointments. In the next 30 days, patient will take medications as directed. In the next 30 days, patient will speak with Social Work and therapist.  Follow Up Plan: [name] has been provided with contact information for the care management team and has been advised to call with any health related questions or concerns.  The care management team will reach out to the patient/patient's daughter.again over the next 30 days.    Evidence-based guidance:   Review biopsychosocial determinants of health screens.  Review need for preventive screening based on age, sex, family history and health history.  Determine level of modifiable health risk.  Discuss identified risks.  Identify areas where behavior change may lead to improved health.  Promote healthy lifestyle.  Evoke change talk using open-ended questions, pros and cons, as well as looking forward.  Identify and manage conditions or preconditions to reduce health risk.  Implement additional goals and interventions based on identified risk factors.

## 2020-12-20 NOTE — Telephone Encounter (Signed)
Pt's daughter states her mother feet feels heavy. Requesting an appt for Thursday or Friday, no open slot. Please call back.

## 2020-12-21 ENCOUNTER — Other Ambulatory Visit: Payer: Self-pay

## 2020-12-21 ENCOUNTER — Ambulatory Visit: Payer: Self-pay

## 2020-12-21 NOTE — Patient Instructions (Signed)
Visit Information  Ms. Gort was given information about Medicaid Managed Care team care coordination services as a part of their [place] Medicaid benefit. [name] verbally consented to engagement with the [facility] Managed Care team.   If you are experiencing a medical emergency, please call 911 or report to your local emergency department or urgent care.   If you have a non-emergency medical problem during routine business hours, please contact your provider's office and ask to speak with a nurse.   For questions related to your [facility], please call: [phone] or visit the homepage here: [email or web address]  If you would like to schedule transportation through your [facility], please call the following number at least 2 days in advance of your appointment: [phone].   Call the [place] at [phone], at any time, 24 hours a day, 7 days a week. If you are in danger or need immediate medical attention call 911.  If you would like help to quit smoking, call 1-800-QUIT-NOW 629-708-2766) OR Espaol: 1-855-Djelo-Ya HD:1601594) o para ms informacin haga clic aqu or Text READY to 200-400 to register via text  Ms. Balboa - following are the goals we discussed in your visit today:   Goals Addressed   None      Social Worker will follow up .   Mickel Fuchs, BSW, [place] Managed Medicaid Team  [phone]   Following is a copy of your plan of care:

## 2020-12-21 NOTE — Patient Outreach (Signed)
Medicaid Managed Care Social Work Note  12/21/2020 Name:  Jennifer [name] MRN:  [id number] DOB:  06/27/1970  Jennifer [name] is an 50 y.o. year old female who is a primary patient of Jose Persia, MD.  The Medicaid Managed Care Coordination team was consulted for assistance with:  [employer]   Ms. Pascoe was given information about Medicaid Managed Care Coordination team services today. [name] Patient agreed to services and verbal consent obtained.  Engaged with patient  for by telephone forinitial visit in response to referral for case management and/or care coordination services.   Assessments/Interventions:  Review of past medical history, allergies, medications, health status, including review of consultants reports, laboratory and other test data, was performed as part of comprehensive evaluation and provision of chronic care management services.  SDOH: (Social Determinant of Health) assessments and interventions performed:  BSW meet with patient's daughter. Daughter stated that patient is in need of PT and rent assistance. Daughter states they have tried resources already however no one is able to help. Daughter states they have tried [employer] and DSS. BSW will research other rent resources and mail patient and daughter a letter. No other resources are needed at this time.      Advanced Directives Status:  Not addressed in this encounter.  Care Plan                 No Known Allergies  Medications Reviewed Today     Reviewed by [name], RN (Registered Nurse) on 12/20/20 at 1439  Med List Status: <None>   Medication Order Taking? Sig Documenting Provider Last Dose Status Informant  Accu-Chek FastClix Lancets MISC [id number] No Check blood sugar 4 times a day Jose Persia, MD Taking Active Child  ACCU-CHEK GUIDE test strip [id number] No CHECK BLOOD SUGAR 4 TIMES PER DAY Mosetta Anis, MD Taking Active Child  apixaban (ELIQUIS) 5 MG TABS tablet  [id number]  Take 1 tablet (5 mg total) by mouth 2 (two) times daily. Harvie Heck, MD  Active   Patient not taking:  Discontinued 09/27/20 0116   Blood Glucose Monitoring Suppl (ACCU-CHEK GUIDE) w/Device KIT [id number] No 1 each by Does not apply route 4 (four) times daily. Jose Persia, MD Taking Active Child  brimonidine [facility]) 0.2 % ophthalmic solution [id number] No Place 1 drop into the left eye every 8 (eight) hours. [provider] 11/22/2020 Active Child  Patient not taking:  Discontinued 09/27/20 0116   Patient not taking:  Discontinued 09/27/20 0116   diclofenac Sodium (VOLTAREN) 1 % GEL [id number] No Apply 2 g topically 4 (four) times daily as needed (foot pain). [provider] Past Week Active Child  dorzolamide-timolol (COSOPT) 22.3-6.8 MG/ML ophthalmic solution [id number] No Place 1 drop into the left eye 2 (two) times daily. [provider] 11/22/2020 Active Child  Patient not taking:  Discontinued 09/27/20 0116   insulin aspart (NOVOLOG) 100 UNIT/ML FlexPen [id number]  Inject 3 Units into the skin 3 (three) times daily with meals. Harvie Heck, MD  Active   insulin detemir (LEVEMIR) 100 UNIT/ML FlexPen [id number]  Inject 15 Units into the skin at bedtime Harvie Heck, MD  Active   Insulin Pen Needle (B-D UF III MINI PEN NEEDLES) 31G X 5 MM MISC [id number] No Use pen needle daily for injections Mosetta Anis, MD Taking Active Child  Insulin Pen Needle (PENTIPS) 32G X 4 MM MISC [id number]  Use as directed with insulin pen Harvie Heck, MD  Active   linagliptin (TRADJENTA)  5 MG TABS tablet [id number]  Take 1 tablet (5 mg total) by mouth daily. Harvie Heck, MD  Active   metFORMIN (GLUCOPHAGE-XR) 500 MG 24 hr tablet [id number]  Take 2 tablets (1,000 mg total) by mouth in the morning and at bedtime. Harvie Heck, MD  Active   metoCLOPramide (REGLAN) 5 MG tablet [id number]  Take 1 tablet (5 mg total) by mouth 4 (four) times daily -  before meals and at bedtime. Harvie Heck, MD  Active   midodrine (PROAMATINE) 5 MG tablet [id number]  Take 1 tablet (5 mg total) by mouth 2 (two) times daily with a meal. 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 ([place]) 11/29/2020   Orthostatic hypotension 11/22/2020   Lower extremity weakness 11/22/2020   CKD (chronic kidney disease) stage 3, GFR 30-59 ml/min ([place]) 11/22/2020   Depression 08/22/2020   Weight loss, non-intentional 07/04/2020   Adult failure to thrive 07/04/2020   Nausea & vomiting 06/28/2020   Early satiety 05/31/2020   UTI (urinary tract infection) 05/16/2020   Elevated liver enzymes 05/15/2020   Closed trimalleolar fracture of right ankle 05/02/2020   Cellulitis (Left 2nd toe) 03/27/2020   Cellulitis of left foot 03/17/2020   AKI (acute kidney injury) ([place]) 03/14/2020   Right ankle injury 09/28/2019   Bilateral lower extremity edema 06/07/2019   Atrial fibrillation ([place]) 09/20/2018   Migraine    Hematuria 08/24/2018   Vulvovaginitis 05/21/2018   (HFpEF) heart failure with preserved ejection fraction ([place]) 01/01/2018   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   High risk medication use 09/16/2017   Pseudophakia of right eye 09/16/2017   Left anterior shoulder pain 07/06/2017   Hypertension associated with diabetes ([place]) 07/23/2016   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   Fatigue 04/02/2016   Uveitic glaucoma of left eye, 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   Vitamin D deficiency 10/30/2014   Diabetic neuropathy ([place]) 05/13/2013   Iron deficiency anemia 05/13/2013   Hair loss 03/24/2012   Healthcare maintenance 02/26/2011   Hyperlipidemia 05/05/2006   Type II diabetes mellitus ([place]) 04/22/1998    Conditions to be addressed/monitored per PCP order:   rent and PT  Care Plan : LCSW Plan of Care  Updates made by Ethelda Chick since 12/21/2020 12:00 AM     Problem: Depression Identification (Depression)      Long-Range Goal: Depressive Symptoms Identified   Start Date: 09/01/2020  Expected End Date: 11/01/2020  Priority: High  Note:   Timeframe:  Long-Range Goal Priority:  High Start Date:    09/01/20                         Expected End Date:  ongoing                   Follow Up Date- 11/27/20  Current barriers:   Chronic Mental Health needs related to depression Limited social support, Mental Health Concerns , and language barrier as patient speaks Arabic Needs Support, Education, and Care Coordination in order to meet unmet mental health needs.  Clinical Goal(s): Over the next 120 days, patient will work with SW to reduce or manage symptoms of anxiety, depression, and stress and increase knowledge and/or ability of: coping skills, healthy  habits, self-management skills, and stress reduction.until connected for ongoing counseling.  Clinical Interventions:  Assessed patient's previous treatment, needs, coping skills, current treatment, support system and barriers to care  [facility] LCSW contact patient's main number through an Arabic interpreter but patient reports that she prefers that all communication and calls go through her daughter who is a great advocate for her and can speak [place].  Patient reports recent stress because her grandson is in the ICU and she was notified that a close friend of hers has cancer.  Other interventions: Depression screen reviewed , Solution-Focused Strategies, Behavioral Activation, Motivational Interviewing, Brief CBT , Provided basic mental health support, education  , and Suicidal Ideation/Homicidal Ideation assessed: ; Patient interviewed and appropriate assessments performed Discussed plans with patient for ongoing care management follow up and provided patient with direct contact information for care management team Assisted patient/caregiver with obtaining information  about health plan benefits Provided education and assistance to client regarding Advanced Directives. Referred patient to [facility] (mental health provider) for long term follow up and therapy/counseling Discussed several options for long term counseling based on need and insurance.  Assisted patient with narrowing the options down to [facility] ) Referral placed on on 09/01/20 for therapy and psychiatry. However, only counseling appointment was scheduled for 11/02/20. Daughter reports that patient wants to wait for psychiatry appointment once she completes her initial therapy appointment. [facility] LCSW sent secure message to [place] asking if counseling appointment is virtual or in person. Family is asking for community resources for menta health support on 11/21/20. [facility] LCSW sent a text message with all mental health resources available in [place], [place] to patient.  Inter-disciplinary care team collaboration (see longitudinal plan of care)  Patient Goals/Self-Care Activities: Over the next 120 days - avoid negative self-talk - develop a personal safety plan - develop a plan to deal with triggers like holidays, anniversaries - exercise at least 2 to 3 times per week - have a plan for how to handle bad days - journal feelings and what helps to feel better or worse - spend time or talk with others at least 2 to 3 times per week - spend time or talk with others every day - watch for early signs of feeling worse - write in journal every day - begin personal counseling - call and visit an old friend - check out volunteer opportunities - join a support group - laugh; watch a funny movie or comedian - learn and use visualization or guided imagery - perform a random act of kindness - practice relaxation or meditation daily - start or continue a personal journal - talk about feelings with a friend, family or spiritual advisor - practice  positive thinking and self-talk Call [facility] if you want to schedule a psychiatry appointment  12/21/20: BSW meet with patient's daughter. Daughter stated that patient is in need of PT and rent assistance. Daughter states they have tried resources already however no one is able to help. Daughter states they have tried [employer] and DSS. BSW will research other rent resources and mail patient and daughter a letter. No other resources are needed at this time.        Follow up:  Patient agrees to Care Plan and Follow-up.  Plan: The Managed Medicaid care management team will reach out to the patient again over the next 14 days.  Date/time of next scheduled Social Work care management/care coordination outreach:  01/10/21  Marga Melnick, [place] Managed Medicaid Team  [phone]

## 2020-12-22 ENCOUNTER — Ambulatory Visit: Payer: Self-pay

## 2020-12-22 ENCOUNTER — Telehealth: Payer: Self-pay | Admitting: Licensed Clinical Social Worker

## 2020-12-22 NOTE — Patient Outreach (Signed)
[facility]) Care Management  12/22/2020  Khamora Zaske 1970-08-24 [id number]   LCSW completed [facility] outreach attempt today but was unable to reach patient successfully. A HIPPA compliant voice message was unable to be left encouraging patient to return call once available by interpreter because voice mail box full. LCSW will ask Scheduling Care Guide to reschedule [facility] SW appointment with patient as well.  Eula Fried, BSW, MSW, [employer] Managed Medicaid LCSW [email or web address] Phone: [phone]

## 2020-12-22 NOTE — Patient Instructions (Signed)
[name] ,   The [facility] Managed Care Team is available to provide assistance to you with your healthcare needs at no cost and as a benefit of your [place] [facility] plan. I'm sorry I was unable to reach you today for our scheduled appointment. Our care guide will call you to reschedule our telephone appointment. Please call me at the number below. I am available to be of assistance to you regarding your healthcare needs. .   Thank you,  Eula Fried, BSW, MSW, LCSW Managed Medicaid LCSW [email or web address] Phone: [phone]

## 2020-12-28 ENCOUNTER — Ambulatory Visit: Payer: Self-pay

## 2020-12-29 ENCOUNTER — Ambulatory Visit: Payer: Self-pay

## 2020-12-29 ENCOUNTER — Telehealth: Payer: Self-pay | Admitting: Pharmacist

## 2020-12-29 NOTE — Patient Outreach (Signed)
  Care Management   Follow Up Note   12/29/2020 Name: Jennifer [name] MRN: [id number] DOB: [birth year]   Referred by: Jose Persia, MD Reason for referral : No chief complaint on file.  Called requested phone number and was subsequently hung up on twice after introducing myself.  Follow Up Plan: The care management team will reach out to the patient again over the next 10 days.   Hughes Better PharmD, CPP High Risk Managed Medicaid [place] [phone]

## 2021-01-02 ENCOUNTER — Other Ambulatory Visit: Payer: Self-pay | Admitting: Licensed Clinical Social Worker

## 2021-01-02 NOTE — Patient Instructions (Signed)
Visit Information  Ms. Shvarts was given information about Medicaid Managed Care team care coordination services as a part of their [place] Medicaid benefit. [name] verbally consented to engagement with the [facility] Managed Care team.   If you are experiencing a medical emergency, please call 911 or report to your local emergency department or urgent care.   If you have a non-emergency medical problem during routine business hours, please contact your provider's office and ask to speak with a nurse.   For questions related to your [facility], please call: [phone] or visit the homepage here: [email or web address]  If you would like to schedule transportation through your [facility], please call the following number at least 2 days in advance of your appointment: [phone].   Call the [place] at [phone], at any time, 24 hours a day, 7 days a week. If you are in danger or need immediate medical attention call 911.  If you would like help to quit smoking, call 1-800-QUIT-NOW 223-710-2380) OR Espaol: 1-855-Djelo-Ya HD:1601594) o para ms informacin haga clic aqu or Text READY to 200-400 to register via text  Ms. Phineas Douglas - following are the goals we discussed in your visit today:   Goals Addressed             This Visit's Progress    Manage My Emotions       Timeframe:  Long-Range Goal Priority:  High Start Date:    09/01/20                         Expected End Date:  ongoing                   Follow Up Date- 02/21/21   - begin personal counseling - call and visit an old friend - check out volunteer opportunities - join a support group - laugh; watch a funny movie or comedian - learn and use visualization or guided imagery - perform a random act of kindness - practice relaxation or meditation daily -  start or continue a personal journal - talk about feelings with a friend, family or spiritual advisor - practice positive thinking and self-talk    Why is this important?   When you are stressed, down or upset, your body reacts too.  For example, your blood pressure may get higher; you may have a headache or stomachache.  When your emotions get the best of you, your body's ability to fight off cold and flu gets weak.  These steps will help you manage your emotions.     Notes:       Eula Fried, BSW, MSW, [employer] Managed Medicaid LCSW [email or web address] Phone: [phone]

## 2021-01-02 NOTE — Patient Outreach (Signed)
Medicaid Managed Care Social Work Note  01/02/2021 Name:  Jennifer [name] MRN:  [id number] DOB:  [birth year]  Jennifer [name] is an 50 y.o. year old female who is a primary patient of Jose Persia, MD.  The Medicaid Managed Care Coordination team was consulted for assistance with:  [place] and Resources  Jennifer [name] was given information about Medicaid Managed Care Coordination team services today. Jennifer [name] Patient and Daughter agreed to services and verbal consent obtained.  Engaged with patient  for by telephone forfollow up visit in response to referral for case management and/or care coordination services.   Assessments/Interventions:  Review of past medical history, allergies, medications, health status, including review of consultants reports, laboratory and other test data, was performed as part of comprehensive evaluation and provision of chronic care management services.  SDOH: (Social Determinant of Health) assessments and interventions performed: SDOH Interventions    Flowsheet Row Most Recent Value  SDOH Interventions   SDOH Interventions for the Following Domains Stress  Stress Interventions Provide Counseling  Depression Interventions/Treatment  Referral to Psychiatry       Advanced Directives Status:  See Care Plan for related entries.  Care Plan                 No Known Allergies  Medications Reviewed Today     Reviewed by Jennifer Medicus, RN (Registered Nurse) on 12/20/20 at 1439  Med List Status: <None>   Medication Order Taking? Sig Documenting Provider Last Dose Status Informant  Accu-Chek FastClix Lancets MISC [id number] No Check blood sugar 4 times a day Jose Persia, MD Taking Active Child  ACCU-CHEK GUIDE test strip [id number] No CHECK BLOOD SUGAR 4 TIMES PER DAY Mosetta Anis, MD Taking Active Child  apixaban (ELIQUIS) 5 MG TABS tablet [id number]  Take 1 tablet (5 mg total) by mouth 2 (two) times daily. Harvie Heck, MD  Active    Patient not taking:  Discontinued 09/27/20 0116   Blood Glucose Monitoring Suppl (ACCU-CHEK GUIDE) w/Device KIT [id number] No 1 each by Does not apply route 4 (four) times daily. Jose Persia, MD Taking Active Child  brimonidine [facility]) 0.2 % ophthalmic solution [id number] No Place 1 drop into the left eye every 8 (eight) hours. [provider] 11/22/2020 Active Child  Patient not taking:  Discontinued 09/27/20 0116   Patient not taking:  Discontinued 09/27/20 0116   diclofenac Sodium (VOLTAREN) 1 % GEL [id number] No Apply 2 g topically 4 (four) times daily as needed (foot pain). [provider] Past Week Active Child  dorzolamide-timolol (COSOPT) 22.3-6.8 MG/ML ophthalmic solution [id number] No Place 1 drop into the left eye 2 (two) times daily. [provider] 11/22/2020 Active Child  Patient not taking:  Discontinued 09/27/20 0116   insulin aspart (NOVOLOG) 100 UNIT/ML FlexPen [id number]  Inject 3 Units into the skin 3 (three) times daily with meals. Harvie Heck, MD  Active   insulin detemir (LEVEMIR) 100 UNIT/ML FlexPen [id number]  Inject 15 Units into the skin at bedtime Harvie Heck, MD  Active   Insulin Pen Needle (B-D UF III MINI PEN NEEDLES) 31G X 5 MM MISC [id number] No Use pen needle daily for injections Mosetta Anis, MD Taking Active Child  Insulin Pen Needle (PENTIPS) 32G X 4 MM MISC [id number]  Use as directed with insulin pen Aslam, Loralyn Freshwater, MD  Active   linagliptin (TRADJENTA) 5 MG TABS tablet [id number]  Take 1 tablet (5 mg total) by mouth daily. Harvie Heck, MD  Active  metFORMIN (GLUCOPHAGE-XR) 500 MG 24 hr tablet [id number]  Take 2 tablets (1,000 mg total) by mouth in the morning and at bedtime. Harvie Heck, MD  Active   metoCLOPramide (REGLAN) 5 MG tablet [id number]  Take 1 tablet (5 mg total) by mouth 4 (four) times daily -  before meals and at bedtime. Harvie Heck, MD  Active   midodrine (PROAMATINE) 5 MG tablet [id number]  Take 1 tablet (5 mg total) by  mouth 2 (two) times daily with a meal. 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 ([place]) 11/29/2020   Orthostatic hypotension 11/22/2020   Lower extremity weakness 11/22/2020   CKD (chronic kidney disease) stage 3, GFR 30-59 ml/min ([place]) 11/22/2020   Depression 08/22/2020   Weight loss, non-intentional 07/04/2020   Adult failure to thrive 07/04/2020   Nausea & vomiting 06/28/2020   Early satiety 05/31/2020   UTI (urinary tract infection) 05/16/2020   Elevated liver enzymes 05/15/2020   Closed trimalleolar fracture of right ankle 05/02/2020   Cellulitis (Left 2nd toe) 03/27/2020   Cellulitis of left foot 03/17/2020   AKI (acute kidney injury) ([place]) 03/14/2020   Right ankle injury 09/28/2019   Bilateral lower extremity edema 06/07/2019   Atrial fibrillation ([place]) 09/20/2018   Migraine    Hematuria 08/24/2018   Vulvovaginitis 05/21/2018   (HFpEF) heart failure with preserved ejection fraction ([place]) 01/01/2018   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   High risk medication use 09/16/2017   Pseudophakia of right eye 09/16/2017   Left anterior shoulder pain 07/06/2017   Hypertension associated with diabetes ([place]) 07/23/2016   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   Fatigue 04/02/2016   Uveitic glaucoma of left eye, 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   Vitamin D deficiency 10/30/2014   Diabetic neuropathy ([place]) 05/13/2013   Iron deficiency anemia 05/13/2013   Hair loss 03/24/2012   Healthcare maintenance 02/26/2011   Hyperlipidemia 05/05/2006   Type II diabetes mellitus ([place]) 04/22/1998    Conditions to be addressed/monitored per PCP order:  Anxiety and Depression  Care Plan : LCSW Plan of Care  Updates made by Greg Cutter, LCSW since 01/02/2021 12:00 AM     Problem: Depression Identification (Depression)       Long-Range Goal: Depressive Symptoms Identified   Start Date: 09/01/2020  Expected End Date: 11/01/2020  Priority: High  Note:   Timeframe:  Long-Range Goal Priority:  High Start Date:    09/01/20                         Expected End Date:  ongoing                   Follow Up Date- 02/21/21  Current barriers:   Chronic Mental Health needs related to depression Limited social support, Mental Health Concerns , and language barrier as patient speaks Arabic Needs Support, Education, and Care Coordination in order to meet unmet mental health needs.  Clinical Goal(s): Over the next 120 days, patient will work with SW to reduce or manage symptoms of anxiety, depression, and stress and increase knowledge and/or ability of: coping skills, healthy habits, self-management skills, and stress reduction.until connected for ongoing counseling.  Clinical Interventions:  Assessed patient's previous treatment, needs, coping skills, current  treatment, support system and barriers to care  [facility] LCSW contact patient's main number through an [place] interpreter but patient reports that she prefers that all communication and calls go through her daughter who is a great advocate for her and can speak [place].  Patient reports recent stress because her grandson is in the ICU and she was notified that a close friend of hers has cancer.  Other interventions: Depression screen reviewed , Solution-Focused Strategies, Behavioral Activation, Motivational Interviewing, Brief CBT , Provided basic mental health support, education  , and Suicidal Ideation/Homicidal Ideation assessed: ; Patient interviewed and appropriate assessments performed Discussed plans with patient for ongoing care management follow up and provided patient with direct contact information for care management team Assisted patient/caregiver with obtaining information about health plan benefits Provided education and assistance to client regarding  Advanced Directives. Referred patient to [facility] (mental health provider) for long term follow up and therapy/counseling Discussed several options for long term counseling based on need and insurance.  Assisted patient with narrowing the options down to [facility] ) Referral placed on on 09/01/20 for therapy and psychiatry. However, only counseling appointment was scheduled for 11/02/20. Daughter reports that patient wants to wait for psychiatry appointment once she completes her initial therapy appointment. [facility] LCSW sent secure message to [place] asking if counseling appointment is virtual or in person. Family is asking for community resources for menta health support on 11/21/20. [facility] LCSW sent a text message with all mental health resources available in [place], [place] to patient. Update 01/02/21- Daughter reports that she has not heard back from [facility]. Patient's daughter will contact them today to inquire. [facility] LCSW will send urgent message to [facility] team.Contact number for [facility] provided to family as well as other available and nearby mental health resources.  Inter-disciplinary care team collaboration (see longitudinal plan of care)  Patient Goals/Self-Care Activities: Over the next 120 days - avoid negative self-talk - develop a personal safety plan - develop a plan to deal with triggers like holidays, anniversaries - exercise at least 2 to 3 times per week - have a plan for how to handle bad days - journal feelings and what helps to feel better or worse - spend time or talk with others at least 2 to 3 times per week - spend time or talk with others every day - watch for early signs of feeling worse - write in journal every day - begin personal counseling - call and visit an old friend - check out volunteer opportunities - join a support group - laugh; watch a funny movie or comedian - learn and use visualization or guided  imagery - perform a random act of kindness - practice relaxation or meditation daily - start or continue a personal journal - talk about feelings with a friend, family or spiritual advisor - practice positive thinking and self-talk Call [facility] if you want to schedule a psychiatry appointment  12/21/20: BSW meet with patient's daughter. Daughter stated that patient is in need of PT and rent assistance. Daughter states they have tried resources already however no one is able to help. Daughter states they have tried [employer] and DSS. BSW will research other rent resources and mail patient and daughter a letter. No other resources are needed at this time.        Follow up:  Patient agrees to Care Plan and Follow-up.  Plan: The Managed Medicaid care management team  will reach out to the patient again over the next 45 days.  Date/time of next scheduled Social Work care management/care coordination outreach:  02/21/21 at 3 pm.  Eula Fried, [place], MSW, [place] [facility] Medicaid LCSW [email or web address] Phone: [phone]

## 2021-01-04 ENCOUNTER — Ambulatory Visit (INDEPENDENT_AMBULATORY_CARE_PROVIDER_SITE_OTHER): Payer: Medicaid Other | Admitting: Internal Medicine

## 2021-01-04 ENCOUNTER — Encounter: Payer: Self-pay | Admitting: Internal Medicine

## 2021-01-04 ENCOUNTER — Other Ambulatory Visit: Payer: Self-pay

## 2021-01-04 VITALS — BP 113/60 | HR 80 | Temp 98.3°F | Ht 66.0 in | Wt 156.0 lb

## 2021-01-04 DIAGNOSIS — E559 Vitamin D deficiency, unspecified: Secondary | ICD-10-CM | POA: Diagnosis present

## 2021-01-04 DIAGNOSIS — E11319 Type 2 diabetes mellitus with unspecified diabetic retinopathy without macular edema: Secondary | ICD-10-CM

## 2021-01-04 DIAGNOSIS — R5381 Other malaise: Secondary | ICD-10-CM

## 2021-01-04 DIAGNOSIS — E1143 Type 2 diabetes mellitus with diabetic autonomic (poly)neuropathy: Secondary | ICD-10-CM | POA: Diagnosis not present

## 2021-01-04 DIAGNOSIS — Z794 Long term (current) use of insulin: Secondary | ICD-10-CM | POA: Diagnosis not present

## 2021-01-04 DIAGNOSIS — S99921A Unspecified injury of right foot, initial encounter: Secondary | ICD-10-CM

## 2021-01-05 ENCOUNTER — Encounter: Payer: Self-pay | Admitting: Internal Medicine

## 2021-01-05 DIAGNOSIS — S99921A Unspecified injury of right foot, initial encounter: Secondary | ICD-10-CM | POA: Insufficient documentation

## 2021-01-05 DIAGNOSIS — R5381 Other malaise: Secondary | ICD-10-CM | POA: Insufficient documentation

## 2021-01-05 LAB — VITAMIN D 25 HYDROXY (VIT D DEFICIENCY, FRACTURES): Vit D, 25-Hydroxy: 23.1 ng/mL — ABNORMAL LOW (ref 30.0–100.0)

## 2021-01-05 MED ORDER — INSULIN ASPART 100 UNIT/ML FLEXPEN: 3.0000 [IU] | PEN_INJECTOR | Freq: Three times a day (TID) | SUBCUTANEOUS | 0 refills | Status: DC

## 2021-01-05 MED ORDER — CALCIUM CITRATE-VITAMIN D 315-200 MG-UNIT PO TABS: 2.0000 | ORAL_TABLET | Freq: Two times a day (BID) | ORAL | 3 refills | Status: DC

## 2021-01-05 MED ORDER — INSULIN DETEMIR 100 UNIT/ML FLEXPEN: 15.0000 [IU] | PEN_INJECTOR | Freq: Every day | SUBCUTANEOUS | 0 refills | Status: DC

## 2021-01-05 MED ORDER — METFORMIN HCL 1000 MG PO TABS: 1000.0000 mg | ORAL_TABLET | Freq: Two times a day (BID) | ORAL | 3 refills | Status: DC

## 2021-01-05 MED ORDER — LINAGLIPTIN 5 MG PO TABS: 5.0000 mg | ORAL_TABLET | Freq: Every day | ORAL | 0 refills | Status: DC

## 2021-01-05 MED ORDER — METFORMIN HCL ER 500 MG PO TB24: 1000.0000 mg | ORAL_TABLET | Freq: Two times a day (BID) | ORAL | 0 refills | Status: DC

## 2021-01-05 MED ORDER — ATORVASTATIN CALCIUM 40 MG PO TABS: 40.0000 mg | ORAL_TABLET | Freq: Every day | ORAL | 3 refills | Status: DC

## 2021-01-05 MED ORDER — APIXABAN 5 MG PO TABS: 5.0000 mg | ORAL_TABLET | Freq: Two times a day (BID) | ORAL | 0 refills | Status: DC

## 2021-01-05 MED ORDER — GABAPENTIN 300 MG PO CAPS: 300.0000 mg | ORAL_CAPSULE | Freq: Three times a day (TID) | ORAL | 3 refills | Status: DC

## 2021-01-05 NOTE — Assessment & Plan Note (Signed)
No longer requiring pharmacotherapy secondary to autonomic dysfunction>hypotension. Now on midodrine. Will resolve this issue.

## 2021-01-05 NOTE — Assessment & Plan Note (Signed)
Family reports improvement since starting midodrine. Continue current management.

## 2021-01-05 NOTE — Progress Notes (Signed)
Office Visit   [name], female    DOB: 09/27/1970, 50 y.o.   MRN: [id number]   PCP: Jose Persia, MD   Subjective:  Jennifer [name] is a 50 y.o. year old female who presents for right toe injury. Please refer to problem based charting for assessment and plan.  Objective:   BP 113/60 (BP Location: Right Arm, Patient Position: Sitting, Cuff Size: Normal)   Pulse 80   Temp 98.3 F (36.8 C) (Oral)   Ht '5\' 6"'$  (1.676 m)   Wt 156 lb (70.8 kg)   LMP 08/21/2016 (Exact Date)   SpO2 100%   BMI 25.18 kg/m  BP Readings from Last 3 Encounters:  01/04/21 113/60  11/29/20 (!) 158/73  11/22/20 121/71   General: alert female sitting up in the a wheelchair Right foot: no obvious deformity or open wounds. Pedal pulses intact. First toe has some mild bruising on the dorsal aspect. Sensation is intact. Cap refill <3 seconds.  Assessment & Plan:   Problem List Items Addressed This Visit       Endocrine   Type II diabetes mellitus ([place]) (Chronic)    Reviewed medications with patient and her daughter. She has been taking novolog 70/30 15U in the morning and 30U at night. I reviewed documentation from her last admission at which time it sounds like her glycemic control was fairly good on 15U levemir and 3U novolog with each meal. Pt's daughter notes that her blood sugars have been in the 200s mostly, with a few in the 300s while on the 70/30. 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 Continue current management.       Relevant Medications   linagliptin (TRADJENTA) 5 MG TABS tablet   atorvastatin (LIPITOR) 40 MG tablet   insulin detemir (LEVEMIR) 100 UNIT/ML FlexPen   metFORMIN (GLUCOPHAGE-XR) 500 MG 24 hr tablet   insulin aspart (NOVOLOG) 100 UNIT/ML FlexPen     Other   Physical deconditioning (Chronic)    Referred to PT at most recent discharge however unsure if this was sent through since family has not heard from anyone to schedule. Will place another order today.       Relevant Orders   Ambulatory referral to Physical Therapy   Vitamin D deficiency - Primary   Relevant Orders   Vitamin D (25 hydroxy) (Completed)   Injury of right toe, initial encounter    HPI: She presents for evaluation of this. She recently returned from [place] where she was seen in an urgent care for this issue. Injury is to the first toe on the right. Mechanism of injury unknown. She reports that she initially had some light bleeding by the medial surface of the toenail.  On evaluation today, there is some mild bruising of the toe, but otherwise, no open  wound, significant swelling, erythema, or increased warmth to suggest infection. Toe is normothermic. No tenderness on palpation. Sensation intact. ABIs normal.  Plan: high risk given her uncontrolled diabetes but she will continue to monitor for s/s of infection. No indication for antimicrobial therapy at this time.        Return in about 3 months (around 04/05/2021).   Pt discussed with Dr. Marty Heck, MD Internal Medicine Resident PGY-3 [facility] 01/05/2021 2:46 PM

## 2021-01-05 NOTE — Assessment & Plan Note (Signed)
Referred to PT at most recent discharge however unsure if this was sent through since family has not heard from anyone to schedule. Will place another order today.

## 2021-01-05 NOTE — Assessment & Plan Note (Signed)
Reviewed medications with patient and her daughter. She has been taking novolog 70/30 15U in the morning and 30U at night. I reviewed documentation from her last admission at which time it sounds like her glycemic control was fairly good on 15U levemir and 3U novolog with each meal. Pt's daughter notes that her blood sugars have been in the 200s mostly, with a few in the 300s while on the 70/30. We will stop the 70/30 for now and try going back to her regimen in the hospital  Levemir 15U qHS  Novolog 3U with meals  Metformin XR '1000mg'$  BID  Tradjenta '5mg'$  daily

## 2021-01-05 NOTE — Assessment & Plan Note (Addendum)
HPI: She presents for evaluation of this. She recently returned from [place] where she was seen in an urgent care for this issue. Injury is to the first toe on the right. Mechanism of injury unknown. She reports that she initially had some light bleeding by the medial surface of the toenail.  On evaluation today, there is some mild bruising of the toe, but otherwise, no open wound, significant swelling, erythema, or increased warmth to suggest infection. Toe is normothermic. No tenderness on palpation. Sensation intact. ABIs normal.  Plan: high risk given her uncontrolled diabetes but she will continue to monitor for s/s of infection. No indication for antimicrobial therapy at this time.

## 2021-01-08 NOTE — Progress Notes (Signed)
Internal Medicine Clinic Attending  Case discussed with Dr. Christian  At the time of the visit.  We reviewed the resident's history and exam and pertinent patient test results.  I agree with the assessment, diagnosis, and plan of care documented in the resident's note.  

## 2021-01-08 NOTE — Telephone Encounter (Signed)
error 

## 2021-01-10 ENCOUNTER — Other Ambulatory Visit: Payer: Self-pay

## 2021-01-10 NOTE — Patient Outreach (Signed)
Care Coordination  01/10/2021  [name] 04-01-1971 [id number]   Medicaid Managed Care   Unsuccessful Outreach Note  01/10/2021 Name: Jennifer [name] MRN: [id number] DOB: 1970/11/08  Referred by: Jose Persia, MD Reason for referral : High Risk Managed Medicaid (MM Social Work [employer])   An unsuccessful telephone outreach was attempted today. The patient was referred to the case management team for assistance with care management and care coordination.   Follow Up Plan: The care management team will reach out to the patient again over the next 14 days.   Mickel Fuchs, BSW, [place] Managed Medicaid Team  [phone]

## 2021-01-17 ENCOUNTER — Telehealth: Payer: Self-pay

## 2021-01-17 NOTE — Telephone Encounter (Signed)
Pt is daughter is requesting a call back .. she stated that er mom had blood work awhile ago but they never received the results

## 2021-01-19 ENCOUNTER — Other Ambulatory Visit: Payer: Self-pay | Admitting: Internal Medicine

## 2021-01-19 ENCOUNTER — Other Ambulatory Visit: Payer: Self-pay

## 2021-01-19 ENCOUNTER — Other Ambulatory Visit: Payer: Self-pay | Admitting: Obstetrics and Gynecology

## 2021-01-19 NOTE — Patient Outreach (Signed)
Care Coordination  01/19/2021  Jennifer [name] 01/25/1971 [id number]  RNCM called at scheduled time.  Patient's daughter, [name], answered the phone and said she would call back.  [name] RN, BSN [place]  [facility] [employer] - Managed Medicaid High Risk [phone].

## 2021-01-25 ENCOUNTER — Ambulatory Visit (INDEPENDENT_AMBULATORY_CARE_PROVIDER_SITE_OTHER): Payer: Medicaid Other | Admitting: Podiatry

## 2021-01-25 ENCOUNTER — Ambulatory Visit (INDEPENDENT_AMBULATORY_CARE_PROVIDER_SITE_OTHER): Payer: Medicaid Other

## 2021-01-25 ENCOUNTER — Telehealth: Payer: Self-pay | Admitting: Podiatry

## 2021-01-25 ENCOUNTER — Other Ambulatory Visit: Payer: Self-pay

## 2021-01-25 DIAGNOSIS — E1149 Type 2 diabetes mellitus with other diabetic neurological complication: Secondary | ICD-10-CM | POA: Diagnosis not present

## 2021-01-25 DIAGNOSIS — M722 Plantar fascial fibromatosis: Secondary | ICD-10-CM

## 2021-01-25 DIAGNOSIS — M79671 Pain in right foot: Secondary | ICD-10-CM | POA: Diagnosis not present

## 2021-01-25 DIAGNOSIS — E1140 Type 2 diabetes mellitus with diabetic neuropathy, unspecified: Secondary | ICD-10-CM | POA: Diagnosis not present

## 2021-01-25 NOTE — Telephone Encounter (Signed)
Patient called to request physical therapy referral to be sent to [facility] instead of [facility]. Thanks

## 2021-01-26 ENCOUNTER — Other Ambulatory Visit: Payer: Self-pay | Admitting: Podiatry

## 2021-01-26 ENCOUNTER — Encounter: Payer: Self-pay | Admitting: Podiatry

## 2021-01-26 DIAGNOSIS — M722 Plantar fascial fibromatosis: Secondary | ICD-10-CM

## 2021-01-28 NOTE — Progress Notes (Signed)
Subjective:   [name], female   DOB: 50 y.o.   MRN: [id number]   HPI Patient presents with caregiver after having fall and fracturing her right ankle.  She is getting pain in her foot chronic in nature pain in her ankle and does not appear to be followed up by the physician who did the surgery.  No other changes A1c still running high but they are unsure of the number   ROS      Objective:  Physical Exam  Neuro neurovascular status intact with patient found to have discomfort in the right heel and into the arch with also discomfort that she describes that is most likely neurological in orientation.  Patient does not seem to have other pathology and has well-healed surgical sites from surgery     Assessment:  Still may be dealing with postop recovery of ankle fracture with possible compensatory fasciitis with also patient found to have probable neuropathy     Plan:  H&P educated caregiver and her on this condition and I do not have good treatment options for her but I do think physical therapy could be of benefit and patient is currently referred for physical therapy for her condition.  Patient will be seen back as needed encouraged to call with any issues,

## 2021-01-29 ENCOUNTER — Encounter (HOSPITAL_COMMUNITY): Payer: Self-pay

## 2021-01-29 ENCOUNTER — Emergency Department (HOSPITAL_COMMUNITY)
Admission: EM | Admit: 2021-01-29 | Discharge: 2021-01-30 | Disposition: A | Discharge status: Home / Self Care | Payer: Medicaid Other | Attending: Emergency Medicine | Admitting: Emergency Medicine

## 2021-01-29 ENCOUNTER — Emergency Department (HOSPITAL_COMMUNITY): Payer: Medicaid Other

## 2021-01-29 DIAGNOSIS — Z7901 Long term (current) use of anticoagulants: Secondary | ICD-10-CM | POA: Diagnosis not present

## 2021-01-29 DIAGNOSIS — Z79899 Other long term (current) drug therapy: Secondary | ICD-10-CM | POA: Insufficient documentation

## 2021-01-29 DIAGNOSIS — N3000 Acute cystitis without hematuria: Secondary | ICD-10-CM | POA: Diagnosis not present

## 2021-01-29 DIAGNOSIS — Z7984 Long term (current) use of oral hypoglycemic drugs: Secondary | ICD-10-CM | POA: Insufficient documentation

## 2021-01-29 DIAGNOSIS — E1122 Type 2 diabetes mellitus with diabetic chronic kidney disease: Secondary | ICD-10-CM | POA: Insufficient documentation

## 2021-01-29 DIAGNOSIS — N183 Chronic kidney disease, stage 3 unspecified: Secondary | ICD-10-CM | POA: Diagnosis not present

## 2021-01-29 DIAGNOSIS — Z794 Long term (current) use of insulin: Secondary | ICD-10-CM | POA: Insufficient documentation

## 2021-01-29 DIAGNOSIS — I130 Hypertensive heart and chronic kidney disease with heart failure and stage 1 through stage 4 chronic kidney disease, or unspecified chronic kidney disease: Secondary | ICD-10-CM | POA: Insufficient documentation

## 2021-01-29 DIAGNOSIS — Z8616 Personal history of COVID-19: Secondary | ICD-10-CM | POA: Diagnosis not present

## 2021-01-29 DIAGNOSIS — R4182 Altered mental status, unspecified: Secondary | ICD-10-CM | POA: Diagnosis not present

## 2021-01-29 DIAGNOSIS — G909 Disorder of the autonomic nervous system, unspecified: Secondary | ICD-10-CM

## 2021-01-29 DIAGNOSIS — N179 Acute kidney failure, unspecified: Secondary | ICD-10-CM | POA: Insufficient documentation

## 2021-01-29 DIAGNOSIS — I5030 Unspecified diastolic (congestive) heart failure: Secondary | ICD-10-CM | POA: Diagnosis not present

## 2021-01-29 LAB — CBG MONITORING, ED: Glucose-Capillary: 98 mg/dL (ref 70–99)

## 2021-01-29 LAB — CBC WITH DIFFERENTIAL/PLATELET
Abs Immature Granulocytes: 0.01 10*3/uL (ref 0.00–0.07)
Basophils Absolute: 0.1 10*3/uL (ref 0.0–0.1)
Basophils Relative: 1 %
Eosinophils Absolute: 0.2 10*3/uL (ref 0.0–0.5)
Eosinophils Relative: 3 %
HCT: 36.1 % (ref 36.0–46.0)
Hemoglobin: 11.8 g/dL — ABNORMAL LOW (ref 12.0–15.0)
Immature Granulocytes: 0 %
Lymphocytes Relative: 53 %
Lymphs Abs: 4.2 10*3/uL — ABNORMAL HIGH (ref 0.7–4.0)
MCH: 28.1 pg (ref 26.0–34.0)
MCHC: 32.7 g/dL (ref 30.0–36.0)
MCV: 86 fL (ref 80.0–100.0)
Monocytes Absolute: 0.5 10*3/uL (ref 0.1–1.0)
Monocytes Relative: 6 %
Neutro Abs: 3 10*3/uL (ref 1.7–7.7)
Neutrophils Relative %: 37 %
Platelets: 179 10*3/uL (ref 150–400)
RBC: 4.2 MIL/uL (ref 3.87–5.11)
RDW: 12 % (ref 11.5–15.5)
WBC: 7.9 10*3/uL (ref 4.0–10.5)
nRBC: 0 % (ref 0.0–0.2)

## 2021-01-29 LAB — COMPREHENSIVE METABOLIC PANEL
ALT: 12 U/L (ref 0–44)
AST: 16 U/L (ref 15–41)
Albumin: 3.1 g/dL — ABNORMAL LOW (ref 3.5–5.0)
Alkaline Phosphatase: 73 U/L (ref 38–126)
Anion gap: 7 (ref 5–15)
BUN: 29 mg/dL — ABNORMAL HIGH (ref 6–20)
CO2: 27 mmol/L (ref 22–32)
Calcium: 9.6 mg/dL (ref 8.9–10.3)
Chloride: 104 mmol/L (ref 98–111)
Creatinine, Ser: 1.28 mg/dL — ABNORMAL HIGH (ref 0.44–1.00)
GFR, Estimated: 51 mL/min — ABNORMAL LOW (ref >60)
Glucose, Bld: 96 mg/dL (ref 70–99)
Potassium: 3.7 mmol/L (ref 3.5–5.1)
Sodium: 138 mmol/L (ref 135–145)
Total Bilirubin: 0.5 mg/dL (ref 0.3–1.2)
Total Protein: 6.9 g/dL (ref 6.5–8.1)

## 2021-01-29 NOTE — ED Triage Notes (Signed)
Pt comes from home via [facility], LSN 1pm, pt became altered and started falling to her L side, not using L arm and unable to see out or R eye.

## 2021-01-29 NOTE — ED Provider Notes (Signed)
[facility] EMERGENCY DEPARTMENT Provider Note   CSN: [id number] Arrival date & time: 01/29/21  2214     History Chief Complaint  Patient presents with   Altered Mental Status    Jennifer [name] is a 50 y.o. 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 fib on Eliquis who presents to the emergency department via EMS from home for altered mental status.  Last seen normal at 1300.  The patient's daughter reports that she has been confused all afternoon.  She is unable to elaborate about confusion, but mostly states that her mother is not responding to all of her questions.  In the room, the patient will intermittently pull the blanket up over her head when her daughter is speaking with her.  Earlier today, the patient was reporting blindness in her right eye, which her daughter states that this happened before.  There was concern that she was leaning to her left side and was unable to use her left arm.   Reports that symptoms worsened and around the time that EMS arrived and the patient began complaining of a headache.  Her daughter states that she has presented with similar symptoms when she has been dehydrated or had a UTI.  Level 5 caveat secondary to altered mental status.  The history is provided by the patient and medical records. No language interpreter was used.  Patient speaks a sub-dialect of Arabic.  The patient's daughter provided the interpretation and declined Fish farm manager.    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  She has not seen a dentist in about one year.  Dentist is on [address].  No fevers chills or systemic symptoms.  Diabetes has been poorly controlled for some time.  She said it has improved recently averaging around 140.  Called the dentist said they would not see patients until May 14th but the urgency o   Pap smear abnormality of cervix with LGSIL    Routine/ritual circumcision    Type II diabetes mellitus ([place])    Uveitis     Patient Active Problem List   Diagnosis Date Noted   Injury of right toe, initial encounter 01/05/2021   Physical deconditioning 01/05/2021   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 ([place]) 11/29/2020   Orthostatic hypotension 11/22/2020   Lower extremity weakness 11/22/2020   CKD (chronic kidney disease) stage 3, GFR 30-59 ml/min ([place]) 11/22/2020   Depression 08/22/2020   Weight loss, non-intentional 07/04/2020   Adult failure to thrive 07/04/2020   Early satiety 05/31/2020   Bilateral lower extremity edema 06/07/2019   Atrial fibrillation ([place]) 09/20/2018  Migraine    (HFpEF) heart failure with preserved ejection fraction ([place]) 01/01/2018   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   Pseudophakia of right eye 09/16/2017   Left anterior shoulder pain 07/06/2017   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   Fatigue 04/02/2016   Uveitic glaucoma of left eye, 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   Vitamin D deficiency 10/30/2014   Diabetic neuropathy ([place]) 05/13/2013   Healthcare maintenance 02/26/2011   Hyperlipidemia 05/05/2006    Type II diabetes mellitus ([place]) 04/22/1998    Past Surgical History:  Procedure Laterality Date   CESAREAN SECTION     x 1   EYE SURGERY Bilateral    Lasik   MULTIPLE TOOTH EXTRACTIONS     bottom denture   ORIF ANKLE FRACTURE Right 05/05/2020   Procedure: OPEN REDUCTION INTERNAL FIXATION (ORIF) ANKLE FRACTURE;  Surgeon: Shona Needles, MD;  Location: [facility];  Service: Orthopedics;  Laterality: Right;     OB History     Gravida  7   Para  4   Term  4   Preterm  0   AB  3   Living         SAB  3   IAB  0   Ectopic  0   Multiple      Live Births              Family History  Problem Relation Age of Onset   Diabetes Father    Hypertension Other     Social History   Tobacco Use   Smoking status: Never   Smokeless tobacco: Never  Vaping Use   Vaping Use: Never used  Substance Use Topics   Alcohol use: No   Drug use: No    Home Medications Prior to Admission medications   Medication Sig Start Date End Date Taking? Authorizing Provider  cephALEXin (KEFLEX) 500 MG capsule Take 1 capsule (500 mg total) by mouth 4 (four) times daily for 5 days. 01/30/21 02/04/21 Yes Lucila Klecka A, PA-C  Accu-Chek FastClix Lancets MISC Check blood sugar 4 times a day 06/07/19   Jose Persia, MD  ACCU-CHEK GUIDE test strip CHECK BLOOD SUGAR 4 TIMES PER DAY 06/23/20   Mosetta Anis, MD  apixaban (ELIQUIS) 5 MG TABS tablet Take 1 tablet (5 mg total) by mouth 2 (two) times daily. 01/05/21   Mitzi Hansen, MD  atorvastatin (LIPITOR) 40 MG tablet Take 1 tablet (40 mg total) by mouth daily. 01/05/21   Mitzi Hansen, MD  Blood Glucose Monitoring Suppl (ACCU-CHEK GUIDE) w/Device KIT 1 each by Does not apply route 4 (four) times daily. 11/19/18   Jose Persia, MD  brimonidine (ALPHAGAN) 0.2 % ophthalmic solution Place 1 drop into the left eye every 8 (eight) hours. 08/01/20   [provider]  calcium citrate-vitamin D (CITRACAL+D) 315-200 MG-UNIT tablet Take 2 tablets  by mouth 2 (two) times daily. 01/05/21 01/05/22  Mitzi Hansen, MD  diclofenac Sodium (VOLTAREN) 1 % GEL Apply 2 g topically 4 (four) times daily as needed (foot pain).    [provider]  dorzolamide-timolol (COSOPT) 22.3-6.8 MG/ML ophthalmic solution Place 1 drop into the left eye 2 (two) times daily. 10/16/20   [provider]  gabapentin (NEURONTIN) 300 MG capsule Take 1 capsule (300 mg total) by mouth 3 (three) times daily. 01/05/21 01/05/22  Mitzi Hansen, MD  insulin aspart (NOVOLOG) 100 UNIT/ML FlexPen Inject 3 Units into the skin 3 (three) times daily with meals. 01/05/21 02/04/21  Christian,  Rylee, MD  insulin detemir (LEVEMIR) 100 UNIT/ML FlexPen Inject 15 Units into the skin at bedtime 01/05/21 02/04/21  Mitzi Hansen, MD  Insulin Pen Needle (B-D UF III MINI PEN NEEDLES) 31G X 5 MM MISC Use pen needle daily for injections 06/23/20   Mosetta Anis, MD  Insulin Pen Needle (PENTIPS) 32G X 4 MM MISC Use as directed with insulin pen 11/29/20   Harvie Heck, MD  linagliptin (TRADJENTA) 5 MG TABS tablet Take 1 tablet (5 mg total) by mouth daily. 01/05/21 02/04/21  Mitzi Hansen, MD  metFORMIN (GLUCOPHAGE-XR) 500 MG 24 hr tablet TAKE 2 TABLETS (1,000 MG TOTAL) BY MOUTH IN THE MORNING AND AT BEDTIME. 01/23/21 02/22/21  Jose Persia, MD  metoCLOPramide (REGLAN) 5 MG tablet Take 1 tablet (5 mg total) by mouth 4 (four) times daily -  before meals and at bedtime. 11/29/20 12/29/20  Harvie Heck, MD  Continuous Blood Gluc Sensor (DEXCOM G6 SENSOR) MISC Use to check blood sugar at least 6 times a day Patient not taking: Reported on 08/29/2020 08/09/20 09/27/20  Harvie Heck, MD  Continuous Blood Gluc Transmit (DEXCOM G6 TRANSMITTER) MISC Use to check blood sugar at least 6 times a day Patient not taking: Reported on 08/29/2020 08/09/20 09/27/20  Harvie Heck, MD    Allergies    Patient has no known allergies.  Review of Systems   Review of Systems  Unable to perform ROS: Mental status  change   Physical Exam Updated Vital Signs BP (!) 193/98   Pulse 86   Temp 98 F (36.7 C) (Oral)   Resp (!) 23   LMP 08/21/2016 (Exact Date)   SpO2 100%   Physical Exam Vitals and nursing note reviewed.  Constitutional:      General: She is not in acute distress. HENT:     Head: Normocephalic.  Eyes:     Conjunctiva/sclera: Conjunctivae normal.     Comments: Blind in left eye, chronic. Right pupil is round and reactive to light.  Cardiovascular:     Rate and Rhythm: Normal rate and regular rhythm.     Heart sounds: No murmur heard.   No friction rub. No gallop.  Pulmonary:     Effort: Pulmonary effort is normal. No respiratory distress.     Breath sounds: No stridor. No wheezing, rhonchi or rales.  Chest:     Chest wall: No tenderness.  Abdominal:     General: There is no distension.     Palpations: Abdomen is soft. There is no mass.     Tenderness: There is no abdominal tenderness. There is no right CVA tenderness, left CVA tenderness, guarding or rebound.     Hernia: No hernia is present.     Comments: Abdomen is soft, nontender, nondistended.  Musculoskeletal:     Cervical back: Neck supple.     Right lower leg: No edema.     Left lower leg: No edema.  Skin:    General: Skin is warm.     Capillary Refill: Capillary refill takes less than 2 seconds.     Coloration: Skin is not pale.     Findings: No lesion or rash.  Neurological:     Mental Status: She is alert.     Comments: Alert.  Intermittently follows simple commands.  Moves all 4 extremities spontaneously.  Good strength against resistance of all 4 extremities.  Cranial nerves 2 through 12 are grossly intact.  Psychiatric:        Behavior: Behavior normal.  ED Results / Procedures / Treatments   Labs (all labs ordered are listed, but only abnormal results are displayed) Labs Reviewed  COMPREHENSIVE METABOLIC PANEL - Abnormal; Notable for the following components:      Result Value   BUN 29 (*)     Creatinine, Ser 1.28 (*)    Albumin 3.1 (*)    GFR, Estimated 51 (*)    All other components within normal limits  CBC WITH DIFFERENTIAL/PLATELET - Abnormal; Notable for the following components:   Hemoglobin 11.8 (*)    Lymphs Abs 4.2 (*)    All other components within normal limits  URINALYSIS, COMPLETE (UACMP) WITH MICROSCOPIC - Abnormal; Notable for the following components:   APPearance CLOUDY (*)    Glucose, UA >=500 (*)    Hgb urine dipstick SMALL (*)    Protein, ur 100 (*)    Leukocytes,Ua LARGE (*)    WBC, UA >50 (*)    Bacteria, UA FEW (*)    Non Squamous Epithelial 0-5 (*)    All other components within normal limits  URINE CULTURE  CBG MONITORING, ED  CBG MONITORING, ED    EKG EKG Interpretation  Date/Time:  Monday January 29 2021 22:29:28 EDT Ventricular Rate:  75 PR Interval:  147 QRS Duration: 87 QT Interval:  409 QTC Calculation: 457 R Axis:   56 Text Interpretation: Sinus rhythm Posterior infarct, old Confirmed by Sherwood Gambler [phone]) on 01/29/2021 10:37:10 PM  Radiology CT Head Wo Contrast  Result Date: 01/29/2021 CLINICAL DATA:  Delirium EXAM: CT HEAD WITHOUT CONTRAST TECHNIQUE: Contiguous axial images were obtained from the base of the skull through the vertex without intravenous contrast. COMPARISON:  CT head 11/07/2020 BRAIN: BRAIN Cerebral ventricle sizes are concordant with the degree of cerebral volume loss. Patchy and confluent areas of decreased attenuation are noted throughout the deep and periventricular white matter of the cerebral hemispheres bilaterally, compatible with chronic microvascular ischemic disease. No evidence of large-territorial acute infarction. No parenchymal hemorrhage. No mass lesion. No extra-axial collection. No mass effect or midline shift. No hydrocephalus. Basilar cisterns are patent. Vascular: No hyperdense vessel. Atherosclerotic calcifications are present within the cavernous internal carotid arteries. Skull: No acute  fracture or focal lesion. Sinuses/Orbits: Paranasal sinuses and mastoid air cells are clear. Right lens replacement. Otherwise the orbits are unremarkable. Other: None. IMPRESSION: No acute intracranial abnormality. Electronically Signed   By: Iven Finn M.D.   On: 01/29/2021 23:33    Procedures Procedures   Medications Ordered in ED Medications  sodium chloride 0.9 % bolus 1,000 mL (0 mLs Intravenous Stopped 01/30/21 0312)  acetaminophen (TYLENOL) tablet 650 mg (650 mg Oral Given 01/30/21 0007)  cephALEXin (KEFLEX) capsule 500 mg (500 mg Oral Given 01/30/21 0320)    ED Course  I have reviewed the triage vital signs and the nursing notes.  Pertinent labs & imaging results that were available during my care of the patient were reviewed by me and considered in my medical decision making (see chart for details).  Clinical Course as of 01/30/21 0700  Mon Jan 29, 2021  2243 Patient rechecked.  Her daughter is not in the room.  Patient is alert.  Vision appears intact as she is able to observe me raising my right and left arms and is able to follow and do the same.  Moves all 4 extremities spontaneously. 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 fib on Eliquis who presents to the emergency department for altered mental status by EMS from home.  Initially, there was concern that patient was blind in the right eye, leaning to the left, not moving her left arm.  EMS did not notice any left-sided deficits in route nor on arrival.  In the ED, patient is moving all 4 extremities spontaneously.  There is good strength equally to the upper and lower extremities.  No vision deficits in the right eye as patient is able to mimic movements and follow  commands when her daughter is not in the room interpreting.  Of note, family declined Arabic interpreter due to speaking a sub-dialect as the patient has been unable to communicate with interpreters previously.  Initially, I do not see any deficits on her neuro exam.  She does intermittently follow commands, but I suspect that this is limited secondary to patient participation.  She will pull the blanket up over her head  and not respond to her daughter.  Labs and imaging been reviewed and independently interpreted by me. The patient has been discussed with Dr. Dina Rich, attending physician.  Creatinine is 1.28, up from 0.87 in August.  UA with pyuria and leukocyte esterase.  No nitrites.  Since patient is confused and has previously presented with similar presentations, will start treatment with Keflex after reviewing the patient's previous cultures and sensitivities.  Head CT is unremarkable.  No other metabolic derangements.  Negative family did mention left arm weakness and visual changes, this was not noted in the ER or by EMS.  Did consider TIA patient's daughter has discussed that she has had previous right vision loss with confusion episodes previously.  I do not feel that the patient needs CTA work-up or inpatient TIA work-up at this time.  Following fluid resuscitation, the patient is back to baseline.  Family agrees.  I do question if this is related to a component of the patient's autonomic.  I have a low suspicion for alcohol intoxication, toxic metabolic encephalopathy, CVA.  Since patient is back to baseline, I think it is reasonable for her to be discharged home since she has previously had similar presentations.  She is hemodynamically stable and in no acute distress.  Safer discharge home with outpatient follow-up as discussed.  Final Clinical Impression(s) / ED Diagnoses Final diagnoses:  Acute cystitis without hematuria  AKI (acute kidney injury) ([place])  Autonomic dysfunction    Rx /  DC Orders ED Discharge Orders          Ordered    cephALEXin (KEFLEX) 500 MG capsule  4 times daily        01/30/21 0315             Makih Stefanko, Maree Erie A, PA-C 01/30/21 [id number]    Merryl Hacker, MD 02/04/21 [phone]

## 2021-01-30 LAB — URINALYSIS, COMPLETE (UACMP) WITH MICROSCOPIC
Bilirubin Urine: NEGATIVE
Glucose, UA: >=500 mg/dL — AB
Ketones, ur: NEGATIVE mg/dL
Nitrite: NEGATIVE
Protein, ur: 100 mg/dL — AB
Specific Gravity, Urine: 1.018 (ref 1.005–1.030)
WBC, UA: >50 WBC/hpf — ABNORMAL HIGH (ref 0–5)
pH: 5 (ref 5.0–8.0)

## 2021-01-30 LAB — URINE CULTURE

## 2021-01-30 MED ORDER — SODIUM CHLORIDE 0.9 % IV BOLUS
1000.0000 mL | Freq: Once | INTRAVENOUS | Status: AC
  Administered 2021-01-30: 1000 mL via INTRAVENOUS

## 2021-01-30 MED ORDER — CEPHALEXIN 500 MG PO CAPS: 500.0000 mg | ORAL_CAPSULE | Freq: Four times a day (QID) | ORAL | 0 refills | Status: DC

## 2021-01-30 MED ORDER — CEPHALEXIN 250 MG PO CAPS
500.0000 mg | ORAL_CAPSULE | Freq: Once | ORAL | Status: AC
  Administered 2021-01-30: 500 mg via ORAL
  Filled 2021-01-30: qty 2

## 2021-01-30 MED ORDER — ACETAMINOPHEN 325 MG PO TABS
650.0000 mg | ORAL_TABLET | Freq: Once | ORAL | Status: AC
  Administered 2021-01-30: 650 mg via ORAL
  Filled 2021-01-30: qty 2

## 2021-01-30 NOTE — Discharge Instructions (Addendum)
Thank you for allowing me to care for you today in the Emergency Department.   Take 1 tablet of Keflex every 6 hours for the next 5 days for UTI.  Your urine has been sent for culture.  Make sure that you are drinking plenty of fluids.  Please call to schedule a follow-up appointment with your primary care provider for reevaluation in the next week.  Return to the emergency department for new or worsening symptoms, including if you pass out, if you have uncontrollable vomiting, if you become unable to walk, or other new, concerning symptoms.

## 2021-01-30 NOTE — ED Notes (Signed)
Pt ambulated to hall with walker with slow but steady gait. Complained of headache but no lightheadedness/dizziness

## 2021-02-03 ENCOUNTER — Other Ambulatory Visit: Payer: Self-pay

## 2021-02-03 ENCOUNTER — Inpatient Hospital Stay (HOSPITAL_COMMUNITY)
Admission: EM | Admit: 2021-02-03 | Discharge: 2021-02-06 | DRG: 281 | Disposition: A | Discharge status: Home / Self Care | Payer: Medicaid Other | Attending: Internal Medicine | Admitting: Internal Medicine

## 2021-02-03 ENCOUNTER — Observation Stay (HOSPITAL_COMMUNITY): Payer: Medicaid Other

## 2021-02-03 ENCOUNTER — Emergency Department (HOSPITAL_COMMUNITY): Payer: Medicaid Other

## 2021-02-03 ENCOUNTER — Encounter (HOSPITAL_COMMUNITY): Payer: Self-pay

## 2021-02-03 DIAGNOSIS — R778 Other specified abnormalities of plasma proteins: Secondary | ICD-10-CM | POA: Diagnosis present

## 2021-02-03 DIAGNOSIS — D638 Anemia in other chronic diseases classified elsewhere: Secondary | ICD-10-CM | POA: Diagnosis present

## 2021-02-03 DIAGNOSIS — R739 Hyperglycemia, unspecified: Secondary | ICD-10-CM

## 2021-02-03 DIAGNOSIS — I4891 Unspecified atrial fibrillation: Secondary | ICD-10-CM

## 2021-02-03 DIAGNOSIS — Z993 Dependence on wheelchair: Secondary | ICD-10-CM

## 2021-02-03 DIAGNOSIS — I480 Paroxysmal atrial fibrillation: Secondary | ICD-10-CM | POA: Diagnosis present

## 2021-02-03 DIAGNOSIS — Z8249 Family history of ischemic heart disease and other diseases of the circulatory system: Secondary | ICD-10-CM

## 2021-02-03 DIAGNOSIS — G9340 Encephalopathy, unspecified: Secondary | ICD-10-CM | POA: Diagnosis not present

## 2021-02-03 DIAGNOSIS — I130 Hypertensive heart and chronic kidney disease with heart failure and stage 1 through stage 4 chronic kidney disease, or unspecified chronic kidney disease: Secondary | ICD-10-CM | POA: Diagnosis present

## 2021-02-03 DIAGNOSIS — E1165 Type 2 diabetes mellitus with hyperglycemia: Secondary | ICD-10-CM | POA: Diagnosis present

## 2021-02-03 DIAGNOSIS — I160 Hypertensive urgency: Secondary | ICD-10-CM

## 2021-02-03 DIAGNOSIS — N390 Urinary tract infection, site not specified: Secondary | ICD-10-CM

## 2021-02-03 DIAGNOSIS — N1831 Chronic kidney disease, stage 3a: Secondary | ICD-10-CM | POA: Diagnosis present

## 2021-02-03 DIAGNOSIS — I214 Non-ST elevation (NSTEMI) myocardial infarction: Secondary | ICD-10-CM | POA: Diagnosis present

## 2021-02-03 DIAGNOSIS — Z7984 Long term (current) use of oral hypoglycemic drugs: Secondary | ICD-10-CM

## 2021-02-03 DIAGNOSIS — Z7901 Long term (current) use of anticoagulants: Secondary | ICD-10-CM

## 2021-02-03 DIAGNOSIS — E1143 Type 2 diabetes mellitus with diabetic autonomic (poly)neuropathy: Secondary | ICD-10-CM

## 2021-02-03 DIAGNOSIS — E782 Mixed hyperlipidemia: Secondary | ICD-10-CM

## 2021-02-03 DIAGNOSIS — I5032 Chronic diastolic (congestive) heart failure: Secondary | ICD-10-CM | POA: Diagnosis present

## 2021-02-03 DIAGNOSIS — E1142 Type 2 diabetes mellitus with diabetic polyneuropathy: Secondary | ICD-10-CM | POA: Diagnosis present

## 2021-02-03 DIAGNOSIS — Z794 Long term (current) use of insulin: Secondary | ICD-10-CM

## 2021-02-03 DIAGNOSIS — Z9181 History of falling: Secondary | ICD-10-CM

## 2021-02-03 DIAGNOSIS — E785 Hyperlipidemia, unspecified: Secondary | ICD-10-CM | POA: Diagnosis present

## 2021-02-03 DIAGNOSIS — E11319 Type 2 diabetes mellitus with unspecified diabetic retinopathy without macular edema: Secondary | ICD-10-CM

## 2021-02-03 DIAGNOSIS — I5030 Unspecified diastolic (congestive) heart failure: Secondary | ICD-10-CM | POA: Diagnosis present

## 2021-02-03 DIAGNOSIS — Z833 Family history of diabetes mellitus: Secondary | ICD-10-CM

## 2021-02-03 DIAGNOSIS — N1832 Chronic kidney disease, stage 3b: Secondary | ICD-10-CM

## 2021-02-03 DIAGNOSIS — Z9114 Patient's other noncompliance with medication regimen: Secondary | ICD-10-CM

## 2021-02-03 DIAGNOSIS — G43909 Migraine, unspecified, not intractable, without status migrainosus: Secondary | ICD-10-CM | POA: Diagnosis present

## 2021-02-03 DIAGNOSIS — R319 Hematuria, unspecified: Secondary | ICD-10-CM

## 2021-02-03 DIAGNOSIS — I2510 Atherosclerotic heart disease of native coronary artery without angina pectoris: Secondary | ICD-10-CM | POA: Diagnosis present

## 2021-02-03 DIAGNOSIS — H5440 Blindness, one eye, unspecified eye: Secondary | ICD-10-CM | POA: Diagnosis present

## 2021-02-03 DIAGNOSIS — E1122 Type 2 diabetes mellitus with diabetic chronic kidney disease: Secondary | ICD-10-CM | POA: Diagnosis present

## 2021-02-03 DIAGNOSIS — Z20822 Contact with and (suspected) exposure to covid-19: Secondary | ICD-10-CM | POA: Diagnosis present

## 2021-02-03 DIAGNOSIS — E119 Type 2 diabetes mellitus without complications: Secondary | ICD-10-CM

## 2021-02-03 DIAGNOSIS — H5462 Unqualified visual loss, left eye, normal vision right eye: Secondary | ICD-10-CM | POA: Diagnosis present

## 2021-02-03 DIAGNOSIS — Z79899 Other long term (current) drug therapy: Secondary | ICD-10-CM

## 2021-02-03 DIAGNOSIS — D696 Thrombocytopenia, unspecified: Secondary | ICD-10-CM | POA: Diagnosis present

## 2021-02-03 DIAGNOSIS — N183 Chronic kidney disease, stage 3 unspecified: Secondary | ICD-10-CM | POA: Diagnosis present

## 2021-02-03 DIAGNOSIS — Z8616 Personal history of COVID-19: Secondary | ICD-10-CM

## 2021-02-03 HISTORY — DX: Unspecified atrial fibrillation: I48.91

## 2021-02-03 LAB — CBC WITH DIFFERENTIAL/PLATELET
Abs Immature Granulocytes: 0.02 10*3/uL (ref 0.00–0.07)
Basophils Absolute: 0 10*3/uL (ref 0.0–0.1)
Basophils Relative: 1 %
Eosinophils Absolute: 0 10*3/uL (ref 0.0–0.5)
Eosinophils Relative: 1 %
HCT: 39.6 % (ref 36.0–46.0)
Hemoglobin: 12.7 g/dL (ref 12.0–15.0)
Immature Granulocytes: 0 %
Lymphocytes Relative: 24 %
Lymphs Abs: 1.2 10*3/uL (ref 0.7–4.0)
MCH: 27.9 pg (ref 26.0–34.0)
MCHC: 32.1 g/dL (ref 30.0–36.0)
MCV: 86.8 fL (ref 80.0–100.0)
Monocytes Absolute: 0.2 10*3/uL (ref 0.1–1.0)
Monocytes Relative: 3 %
Neutro Abs: 3.6 10*3/uL (ref 1.7–7.7)
Neutrophils Relative %: 71 %
Platelets: 163 10*3/uL (ref 150–400)
RBC: 4.56 MIL/uL (ref 3.87–5.11)
RDW: 11.9 % (ref 11.5–15.5)
WBC: 5.1 10*3/uL (ref 4.0–10.5)
nRBC: 0 % (ref 0.0–0.2)

## 2021-02-03 LAB — BASIC METABOLIC PANEL
Anion gap: 11 (ref 5–15)
BUN: 34 mg/dL — ABNORMAL HIGH (ref 6–20)
CO2: 22 mmol/L (ref 22–32)
Calcium: 8.9 mg/dL (ref 8.9–10.3)
Chloride: 104 mmol/L (ref 98–111)
Creatinine, Ser: 1.14 mg/dL — ABNORMAL HIGH (ref 0.44–1.00)
GFR, Estimated: 59 mL/min — ABNORMAL LOW (ref >60)
Glucose, Bld: 313 mg/dL — ABNORMAL HIGH (ref 70–99)
Potassium: 4 mmol/L (ref 3.5–5.1)
Sodium: 137 mmol/L (ref 135–145)

## 2021-02-03 LAB — COMPREHENSIVE METABOLIC PANEL
ALT: 13 U/L (ref 0–44)
AST: 28 U/L (ref 15–41)
Albumin: 3.5 g/dL (ref 3.5–5.0)
Alkaline Phosphatase: 81 U/L (ref 38–126)
Anion gap: 12 (ref 5–15)
BUN: 28 mg/dL — ABNORMAL HIGH (ref 6–20)
CO2: 19 mmol/L — ABNORMAL LOW (ref 22–32)
Calcium: 9.3 mg/dL (ref 8.9–10.3)
Chloride: 103 mmol/L (ref 98–111)
Creatinine, Ser: 1 mg/dL (ref 0.44–1.00)
GFR, Estimated: >60 mL/min (ref >60)
Glucose, Bld: 336 mg/dL — ABNORMAL HIGH (ref 70–99)
Potassium: 5.1 mmol/L (ref 3.5–5.1)
Sodium: 134 mmol/L — ABNORMAL LOW (ref 135–145)
Total Bilirubin: 1.3 mg/dL — ABNORMAL HIGH (ref 0.3–1.2)
Total Protein: 7.8 g/dL (ref 6.5–8.1)

## 2021-02-03 LAB — URINALYSIS, ROUTINE W REFLEX MICROSCOPIC
Bacteria, UA: NONE SEEN
Bilirubin Urine: NEGATIVE
Glucose, UA: >=500 mg/dL — AB
Ketones, ur: 5 mg/dL — AB
Nitrite: NEGATIVE
Protein, ur: 100 mg/dL — AB
Specific Gravity, Urine: 1.021 (ref 1.005–1.030)
pH: 5 (ref 5.0–8.0)

## 2021-02-03 LAB — RESP PANEL BY RT-PCR (FLU A&B, COVID) ARPGX2
Influenza A by PCR: NEGATIVE
Influenza B by PCR: NEGATIVE
SARS Coronavirus 2 by RT PCR: NEGATIVE

## 2021-02-03 LAB — TROPONIN I (HIGH SENSITIVITY): Troponin I (High Sensitivity): 1611 ng/L (ref <18)

## 2021-02-03 LAB — BLOOD GAS, VENOUS
Acid-base deficit: 5.1 mmol/L — ABNORMAL HIGH (ref 0.0–2.0)
Bicarbonate: 22 mmol/L (ref 20.0–28.0)
O2 Saturation: 50.9 %
Patient temperature: 98.6
pCO2, Ven: 52.2 mmHg (ref 44.0–60.0)
pH, Ven: 7.249 — ABNORMAL LOW (ref 7.250–7.430)
pO2, Ven: 32.4 mmHg (ref 32.0–45.0)

## 2021-02-03 LAB — CK: Total CK: 116 U/L (ref 38–234)

## 2021-02-03 LAB — LACTIC ACID, PLASMA: Lactic Acid, Venous: 1.4 mmol/L (ref 0.5–1.9)

## 2021-02-03 LAB — I-STAT BETA HCG BLOOD, ED (MC, WL, AP ONLY): I-stat hCG, quantitative: <5 m[IU]/mL (ref <5)

## 2021-02-03 LAB — PHOSPHORUS: Phosphorus: 4.9 mg/dL — ABNORMAL HIGH (ref 2.5–4.6)

## 2021-02-03 LAB — BETA-HYDROXYBUTYRIC ACID: Beta-Hydroxybutyric Acid: 1.34 mmol/L — ABNORMAL HIGH (ref 0.05–0.27)

## 2021-02-03 LAB — GLUCOSE, CAPILLARY: Glucose-Capillary: 278 mg/dL — ABNORMAL HIGH (ref 70–99)

## 2021-02-03 LAB — MAGNESIUM: Magnesium: 1.9 mg/dL (ref 1.7–2.4)

## 2021-02-03 MED ORDER — DORZOLAMIDE HCL-TIMOLOL MAL 2-0.5 % OP SOLN
1.0000 [drp] | Freq: Two times a day (BID) | OPHTHALMIC | Status: DC
  Administered 2021-02-03 – 2021-02-06 (×6): 1 [drp] via OPHTHALMIC
  Filled 2021-02-03: qty 10

## 2021-02-03 MED ORDER — INSULIN ASPART 100 UNIT/ML IJ SOLN
0.0000 [IU] | INTRAMUSCULAR | Status: DC
  Administered 2021-02-04: 7 [IU] via SUBCUTANEOUS
  Administered 2021-02-04 (×2): 2 [IU] via SUBCUTANEOUS

## 2021-02-03 MED ORDER — SODIUM CHLORIDE 0.9 % IV SOLN
1.0000 g | INTRAVENOUS | Status: DC
  Administered 2021-02-03 – 2021-02-05 (×3): 1 g via INTRAVENOUS
  Filled 2021-02-03 (×4): qty 10

## 2021-02-03 MED ORDER — ENSURE ENLIVE PO LIQD
237.0000 mL | Freq: Two times a day (BID) | ORAL | Status: DC
  Administered 2021-02-05 – 2021-02-06 (×3): 237 mL via ORAL

## 2021-02-03 MED ORDER — ACETAMINOPHEN 325 MG PO TABS
650.0000 mg | ORAL_TABLET | Freq: Four times a day (QID) | ORAL | Status: DC | PRN
  Administered 2021-02-05 – 2021-02-06 (×2): 650 mg via ORAL
  Filled 2021-02-03 (×2): qty 2

## 2021-02-03 MED ORDER — ACETAMINOPHEN 325 MG PO TABS
650.0000 mg | ORAL_TABLET | Freq: Once | ORAL | Status: AC
  Administered 2021-02-03: 650 mg via ORAL
  Filled 2021-02-03: qty 2

## 2021-02-03 MED ORDER — SODIUM CHLORIDE 0.9 % IV BOLUS
1000.0000 mL | Freq: Once | INTRAVENOUS | Status: AC
  Administered 2021-02-03: 1000 mL via INTRAVENOUS

## 2021-02-03 MED ORDER — METOCLOPRAMIDE HCL 5 MG PO TABS
5.0000 mg | ORAL_TABLET | Freq: Four times a day (QID) | ORAL | Status: DC | PRN
  Administered 2021-02-04: 5 mg via ORAL
  Filled 2021-02-03: qty 1

## 2021-02-03 MED ORDER — APIXABAN 5 MG PO TABS
5.0000 mg | ORAL_TABLET | Freq: Two times a day (BID) | ORAL | Status: DC
  Administered 2021-02-03: 5 mg via ORAL
  Filled 2021-02-03: qty 1

## 2021-02-03 MED ORDER — ATORVASTATIN CALCIUM 40 MG PO TABS
40.0000 mg | ORAL_TABLET | Freq: Every day | ORAL | Status: DC
  Administered 2021-02-04 – 2021-02-06 (×3): 40 mg via ORAL
  Filled 2021-02-03 (×3): qty 1

## 2021-02-03 MED ORDER — INSULIN DETEMIR 100 UNIT/ML ~~LOC~~ SOLN
15.0000 [IU] | Freq: Every day | SUBCUTANEOUS | Status: DC
  Administered 2021-02-03: 15 [IU] via SUBCUTANEOUS
  Filled 2021-02-03: qty 0.15

## 2021-02-03 MED ORDER — SODIUM CHLORIDE 0.9 % IV SOLN: INTRAVENOUS | Status: DC

## 2021-02-03 MED ORDER — ACETAMINOPHEN 650 MG RE SUPP: 650.0000 mg | Freq: Four times a day (QID) | RECTAL | Status: DC | PRN

## 2021-02-03 MED ORDER — DILTIAZEM HCL-DEXTROSE 125-5 MG/125ML-% IV SOLN (PREMIX)
5.0000 mg/h | INTRAVENOUS | Status: DC
  Administered 2021-02-03: 5 mg/h via INTRAVENOUS
  Filled 2021-02-03: qty 125

## 2021-02-03 MED ORDER — HYDROCODONE-ACETAMINOPHEN 5-325 MG PO TABS
1.0000 | ORAL_TABLET | ORAL | Status: DC | PRN
  Administered 2021-02-04: 2 via ORAL
  Filled 2021-02-03: qty 2

## 2021-02-03 MED ORDER — BRIMONIDINE TARTRATE 0.2 % OP SOLN
1.0000 [drp] | Freq: Three times a day (TID) | OPHTHALMIC | Status: DC
  Administered 2021-02-03 – 2021-02-06 (×9): 1 [drp] via OPHTHALMIC
  Filled 2021-02-03: qty 5

## 2021-02-03 NOTE — H&P (Addendum)
Jennifer [name] HUD:[id number] DOB: 05/16/1970 DOA: 02/03/2021   PCP: Jose Persia, MD   Teaching service  Outpatient Specialists:  at [facility]    Patient arrived to ER on 02/03/21 at 1304 Referred by Attending Toy Baker, MD   Patient coming from: home Lives     With family    Chief Complaint:   Chief Complaint  Patient presents with   Vomiting    HPI: Jennifer [name] is a 50 y.o. 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 Fib on Eliquis, Anemia, right ankle fracture HLD, HTN    Presented with   confusion, nausea, not tolerating home meds Witn nausea and vomiitng Bg up to 400's BP up to 200/80 Have not been taking her home meds  Was seen in  ER 5 days ago diagnosed with uti started on Keflex, CT head at the time was neg She has not been able to take her meds Given confusion family was worried she has developed DKA as she did in the past and brought her to ER Pt is wheelchair bound since her fall in January resulting in fracture of right ankle Family is unclear what medications does pt takes They were recently contacted by physical therapy plant to start in the next few weeks  Family also reports she have had some problems with low Bp in the past and was treted with Midodrine She takes eye drops but unsure what is their name  Infectious risk factors:      Initial COVID TEST  NEGATIVE   Lab Results  Component Value Date   [place] 02/03/2021   [place] NEGATIVE 11/22/2020   [place] Not Detected 06/23/2020   [place] (A) 06/15/2020    Regarding pertinent Chronic problems:     Hyperlipidemia - on statins lipitor Lipid Panel     Component Value Date/Time   CHOL 222 (H) 06/07/2019 1549   TRIG 126 06/07/2019 1549   HDL 56 06/07/2019 1549   CHOLHDL 4.0 06/07/2019 1549   CHOLHDL  3.3 02/18/2014 1653   VLDL 17 02/18/2014 1653   LDLCALC 144 (H) 06/07/2019 1549   LABVLDL 22 06/07/2019 1549     HTN on hx of hypotension needing midodrine in the past     DM 2 -  Lab Results  Component Value Date   HGBA1C 13.5 (A) 11/22/2020   on insulin,  Levemir 15U qHS Novolog 3U with meals Metformin XR 1096m BID Tradjenta 566mdaily unclear if taken   A. Fib -  - CHA2DS2 vas score 4       current  on anticoagulation with  Eliquis,   While in ER: Noted to have hyperglycemia Hypertensive initially up to 175/103    ED Triage Vitals  Enc Vitals Group     BP 02/03/21 1321 (!) 175/103     Pulse Rate 02/03/21 1321 (!) 130     Resp 02/03/21 1321 18     Temp 02/03/21 1321 97.7 F (36.5 C)     Temp Source 02/03/21 1321 Oral     SpO2 02/03/21 1321 98 %     Weight --      Height --      Head Circumference --      Peak Flow --      Pain Score 02/03/21 1316 0     Pain Loc --      Pain Edu? --  Excl. in [place]? --   SWHQ(75)@     _________________________________________ Significant initial  Findings: Abnormal Labs Reviewed  COMPREHENSIVE METABOLIC PANEL - Abnormal; Notable for the following components:      Result Value   Sodium 134 (*)    CO2 19 (*)    Glucose, Bld 336 (*)    BUN 28 (*)    Total Bilirubin 1.3 (*)    All other components within normal limits  URINALYSIS, ROUTINE W REFLEX MICROSCOPIC - Abnormal; Notable for the following components:   APPearance HAZY (*)    Glucose, UA >=500 (*)    Hgb urine dipstick SMALL (*)    Ketones, ur 5 (*)    Protein, ur 100 (*)    Leukocytes,Ua SMALL (*)    Non Squamous Epithelial 0-5 (*)    All other components within normal limits  BLOOD GAS, VENOUS - Abnormal; Notable for the following components:   pH, Ven 7.249 (*)    Acid-base deficit 5.1 (*)    All other components within normal limits   ____________________________________________ Ordered    CXR -  NON acute  CTabd/pelvis - No renal, ureteral, or  bladder calculi.  No hydronephrosis.   Suspected gallbladder sludge versus noncalcified gallstones, without associated inflammatory changes.    ______________ Troponin  ordered ECG: Ordered Personally reviewed by me showing: HR : 129 Rhythm:   A.fib. W RVR   no evidence of ischemic changes QTC 494   The recent clinical data is shown below. Vitals:   02/03/21 1430 02/03/21 1650 02/03/21 1746 02/03/21 1838  BP: 102/73 98/61 111/70 132/78  Pulse: (!) 39 (!) 146 (!) 121 (!) 128  Resp: _0 Temp:      TempSrc:      SpO2: 91% 98% 96% 99%      WBC     Component Value Date/Time   WBC 5.1 02/03/2021 1547   LYMPHSABS 1.2 02/03/2021 1547   LYMPHSABS 2.2 04/19/2020 1555   MONOABS 0.2 02/03/2021 1547   EOSABS 0.0 02/03/2021 1547   EOSABS 0.2 04/19/2020 1555   BASOSABS 0.0 02/03/2021 1547   BASOSABS 0.0 04/19/2020 1555      UA  evidence of UTI      Urine analysis:    Component Value Date/Time   COLORURINE YELLOW 02/03/2021 1547   APPEARANCEUR HAZY (A) 02/03/2021 1547   LABSPEC 1.021 02/03/2021 1547   PHURINE 5.0 02/03/2021 1547   GLUCOSEU >=500 (A) 02/03/2021 1547   GLUCOSEU > 1000 mg/dL (A) 12/16/2008 2030   HGBUR SMALL (A) 02/03/2021 1547   HGBUR trace-intact 03/20/2010 1517   BILIRUBINUR NEGATIVE 02/03/2021 1547   BILIRUBINUR Negative 08/09/2020 1552   KETONESUR 5 (A) 02/03/2021 1547   PROTEINUR 100 (A) 02/03/2021 1547   UROBILINOGEN 0.2 08/09/2020 1552   UROBILINOGEN 0.2 12/05/2019 1753   NITRITE NEGATIVE 02/03/2021 1547   LEUKOCYTESUR SMALL (A) 02/03/2021 1547    Results for orders placed or performed during the hospital encounter of 02/03/21  Resp Panel by RT-PCR (Flu A&B, Covid) Nasopharyngeal Swab     Status: None   Collection Time: 02/03/21  1:40 PM   Specimen: Nasopharyngeal Swab; Nasopharyngeal(NP) swabs in vial transport medium  Result Value Ref Range Status   SARS Coronavirus 2 by RT PCR NEGATIVE NEGATIVE Final         Influenza A by PCR  NEGATIVE NEGATIVE Final   Influenza B by PCR NEGATIVE NEGATIVE Final  _____________________________________________ Hospitalist was called for admission for UTI, hyperglycemia  The following Work up has been ordered so far:  Orders Placed This Encounter  Procedures   Resp Panel by RT-PCR (Flu A&B, Covid) Nasopharyngeal Swab   CT Renal Stone Study   CBC with Differential/Platelet   Comprehensive metabolic panel   Urinalysis, Routine w reflex microscopic   Blood gas, venous   In and Out Cath   Cardiac monitoring   Consult to hospitalist   I-Stat beta hCG blood, ED ([facility], [facility], [facility] only)   EKG 12-Lead   EKG 12-Lead   Place in observation (patient's expected length of stay will be less than 2 midnights)    Following Medications were ordered in ER: Medications  0.9 %  sodium chloride infusion ( Intravenous New Bag/Given 02/03/21 1400)  diltiazem (CARDIZEM) 125 mg in dextrose 5% 125 mL (1 mg/mL) infusion (10 mg/hr Intravenous Rate/Dose Change 02/03/21 1751)  cefTRIAXone (ROCEPHIN) 1 g in sodium chloride 0.9 % 100 mL IVPB (has no administration in time range)  sodium chloride 0.9 % bolus 1,000 mL (0 mLs Intravenous Stopped 02/03/21 1652)  acetaminophen (TYLENOL) tablet 650 mg (650 mg Oral Given 02/03/21 1412)        Consult Orders  (From admission, onward)           Start     Ordered   02/03/21 1747  Consult to hospitalist  Delay in page secondary to change of shift.  Paged to [facility] MD @ [phone]  Once       Provider:  (Not yet assigned)  Question Answer Comment  Place call to: [facility]   Reason for Consult Admit      02/03/21 1747              OTHER Significant initial  Findings:  labs showing:    Recent Labs  Lab 01/29/21 2305 02/03/21 1547  NA 138 134*  K 3.7 5.1  CO2 27 19*  GLUCOSE 96 336*  BUN 29* 28*  CREATININE 1.28* 1.00  CALCIUM 9.6 9.3    Cr    stable,    Lab Results  Component Value Date   CREATININE 1.00 02/03/2021    CREATININE 1.28 (H) 01/29/2021   CREATININE 0.87 11/29/2020    Recent Labs  Lab 01/29/21 2305 02/03/21 1547  AST 16 28  ALT 12 13  ALKPHOS 73 81  BILITOT 0.5 1.3*  PROT 6.9 7.8  ALBUMIN 3.1* 3.5   Lab Results  Component Value Date   CALCIUM 9.3 02/03/2021   PHOS 4.2 11/24/2020    Plt: Lab Results  Component Value Date   PLT 163 02/03/2021     COVID-19 Labs  No results for input(s): DDIMER, FERRITIN, LDH, CRP in the last 72 hours.  Lab Results  Component Value Date   SARSCOV2NAA NEGATIVE 02/03/2021   [place] NEGATIVE 11/22/2020   SARSCOV2NAA Not Detected 06/23/2020   SARSCOV2NAA POSITIVE (A) 06/15/2020     Recent Labs  Lab 01/29/21 2305 02/03/21 1547  WBC 7.9 5.1  NEUTROABS 3.0 3.6  HGB 11.8* 12.7  HCT 36.1 39.6  MCV 86.0 86.8  PLT 179 163    HG/HCT   stable,       Component Value Date/Time   HGB 12.7 02/03/2021 1547   HGB 8.9 (L) 04/19/2020 1555   HCT 39.6 02/03/2021 1547   HCT 29.8 (L) 04/19/2020 1555   MCV 86.8 02/03/2021 1547   MCV 84 04/19/2020 1555      DM  labs:  HbA1C: Recent Labs    06/16/20 0415 09/01/20 1115 11/22/20 1414  HGBA1C 11.6* 10.4* 13.5*      CBG (last 3)  No results for input(s): GLUCAP in the last 72 hours.    Cultures:    Component Value Date/Time   SDES URINE, CLEAN CATCH 01/30/2021 0215   SPECREQUEST  01/30/2021 0215    NONE Performed at [facility] [address], [place], [place] [address]    CULT MULTIPLE SPECIES PRESENT, SUGGEST RECOLLECTION (A) 01/30/2021 0215   REPTSTATUS 01/30/2021 FINAL 01/30/2021 0215     Radiological Exams on Admission: No results found. _______________________________________________________________________________________________________ Latest  Blood pressure 132/78, pulse (!) 128, temperature 97.7 F (36.5 C), temperature source Oral, resp. rate 20, last menstrual period 08/21/2016, SpO2 99 %.   Review of Systems:    Pertinent positives include:  fatigue,  nausea, headache  Constitutional:  No weight loss, night sweats, Fevers, chills,  weight loss  HEENT:   Difficulty swallowing,Tooth/dental problems,Sore throat,  No sneezing, itching, ear ache, nasal congestion, post nasal drip,  Cardio-vascular:  No chest pain, Orthopnea, PND, anasarca, dizziness, palpitations.no Bilateral lower extremity swelling  GI:  No heartburn, indigestion, abdominal pain, vomiting, diarrhea, change in bowel habits, loss of appetite, melena, blood in stool, hematemesis Resp:  no shortness of breath at rest. No dyspnea on exertion, No excess mucus, no productive cough, No non-productive cough, No coughing up of blood.No change in color of mucus.No wheezing. Skin:  no rash or lesions. No jaundice GU:  no dysuria, change in color of urine, no urgency or frequency. No straining to urinate.  No flank pain.  Musculoskeletal:  No joint pain or no joint swelling. No decreased range of motion. No back pain.  Psych:  No change in mood or affect. No depression or anxiety. No memory loss.  Neuro: no localizing neurological complaints, no tingling, no weakness, no double vision, no gait abnormality, no slurred speech, no confusion  All systems reviewed and apart from [place] all are negative _______________________________________________________________________________________________ Past Medical History:   Past Medical History:  Diagnosis Date   Anemia, iron deficiency    Atrial fibrillation ([place])    Blindness of left eye    Closed trimalleolar fracture of right ankle 05/02/2020   Added automatically from request for surgery [id number]   COVID-19 virus infection 06/15/2020   Decreased visual acuity    Left eye   Depression    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  She has not seen a dentist in about one year.  Dentist is on [address].  No fevers chills or systemic symptoms.  Diabetes has been poorly controlled for some time.  She said it has improved recently averaging around 140.  Called the dentist said they would not see patients until May 14th but the urgency o   Pap smear abnormality of cervix with LGSIL    Routine/ritual circumcision    Type II diabetes mellitus ([place])    Uveitis       Past Surgical History:  Procedure Laterality Date  CESAREAN SECTION     x 1   EYE SURGERY Bilateral    Lasik   MULTIPLE TOOTH EXTRACTIONS     bottom denture   ORIF ANKLE FRACTURE Right 05/05/2020   Procedure: OPEN REDUCTION INTERNAL FIXATION (ORIF) ANKLE FRACTURE;  Surgeon: Shona Needles, MD;  Location: [facility];  Service: Orthopedics;  Laterality: Right;    Social History:  Ambulatory  walker or  wheelchair bound,      reports that she has never smoked. She has never used smokeless tobacco. She reports that she does not drink alcohol and does not use drugs.     Family History:   Family History  Problem Relation Age of Onset   Diabetes Father    Hypertension Other    ______________________________________________________________________________________________ Allergies: No Known Allergies   Prior to Admission medications   Medication Sig Start Date End Date Taking? Authorizing Provider  Accu-Chek FastClix Lancets MISC Check blood sugar 4 times a day 06/07/19   Jose Persia, MD  ACCU-CHEK GUIDE test strip CHECK BLOOD SUGAR 4 TIMES PER DAY 06/23/20   Mosetta Anis, MD  apixaban (ELIQUIS) 5 MG TABS tablet Take 1 tablet (5 mg total) by mouth 2 (two) times daily. 01/05/21   Mitzi Hansen, MD  atorvastatin (LIPITOR) 40 MG  tablet Take 1 tablet (40 mg total) by mouth daily. 01/05/21   Mitzi Hansen, MD  Blood Glucose Monitoring Suppl (ACCU-CHEK GUIDE) w/Device KIT 1 each by Does not apply route 4 (four) times daily. 11/19/18   Jose Persia, MD  brimonidine (ALPHAGAN) 0.2 % ophthalmic solution Place 1 drop into the left eye every 8 (eight) hours. 08/01/20   [provider]  calcium citrate-vitamin D (CITRACAL+D) 315-200 MG-UNIT tablet Take 2 tablets by mouth 2 (two) times daily. 01/05/21 01/05/22  Mitzi Hansen, MD  cephALEXin (KEFLEX) 500 MG capsule Take 1 capsule (500 mg total) by mouth 4 (four) times daily for 5 days. 01/30/21 02/04/21  McDonald, Mia A, PA-C  diclofenac Sodium (VOLTAREN) 1 % GEL Apply 2 g topically 4 (four) times daily as needed (foot pain).    [provider]  dorzolamide-timolol (COSOPT) 22.3-6.8 MG/ML ophthalmic solution Place 1 drop into the left eye 2 (two) times daily. 10/16/20   [provider]  gabapentin (NEURONTIN) 300 MG capsule Take 1 capsule (300 mg total) by mouth 3 (three) times daily. 01/05/21 01/05/22  Mitzi Hansen, MD  insulin aspart (NOVOLOG) 100 UNIT/ML FlexPen Inject 3 Units into the skin 3 (three) times daily with meals. 01/05/21 02/04/21  Mitzi Hansen, MD  insulin detemir (LEVEMIR) 100 UNIT/ML FlexPen Inject 15 Units into the skin at bedtime 01/05/21 02/04/21  Mitzi Hansen, MD  Insulin Pen Needle (B-D UF III MINI PEN NEEDLES) 31G X 5 MM MISC Use pen needle daily for injections 06/23/20   Mosetta Anis, MD  Insulin Pen Needle (PENTIPS) 32G X 4 MM MISC Use as directed with insulin pen 11/29/20   Harvie Heck, MD  linagliptin (TRADJENTA) 5 MG TABS tablet Take 1 tablet (5 mg total) by mouth daily. 01/05/21 02/04/21  Mitzi Hansen, MD  metFORMIN (GLUCOPHAGE-XR) 500 MG 24 hr tablet TAKE 2 TABLETS (1,000 MG TOTAL) BY MOUTH IN THE MORNING AND AT BEDTIME. 01/23/21 02/22/21  Jose Persia, MD  metoCLOPramide (REGLAN) 5 MG tablet Take 1 tablet (5 mg  total) by mouth 4 (four) times daily -  before meals and at bedtime. 11/29/20 12/29/20  Harvie Heck, MD  Continuous Blood Gluc Sensor (DEXCOM G6 SENSOR) MISC Use  to check blood sugar at least 6 times a day Patient not taking: Reported on 08/29/2020 08/09/20 09/27/20  Harvie Heck, MD  Continuous Blood Gluc Transmit (DEXCOM G6 TRANSMITTER) MISC Use to check blood sugar at least 6 times a day Patient not taking: Reported on 08/29/2020 08/09/20 09/27/20  Harvie Heck, MD    ___________________________________________________________________________________________________ Physical Exam: Vitals with BMI 02/03/2021 02/03/2021 02/03/2021  Height - - -  Weight - - -  BMI - - -  Systolic 185 631 98  Diastolic 78 70 61  Pulse 497 121 146     1. General:  in No  Acute distress    Chronically ill -appearing 2. Psychological: Alert and  Oriented 3. Head/ENT:    Dry Mucous Membranes                          Head Non traumatic, neck supple                          Poor Dentition 4. SKIN: decreased Skin turgor,  Skin clean Dry and intact no rash 5. Heart: Regular rate and rhythm  Murmur, no Rub or gallop 6. Lungs:  Clear to auscultation bilaterally, no wheezes or crackles   7. Abdomen: Soft,  non-tender, Non distended  bowel sounds present 8. Lower extremities: no clubbing, cyanosis, no  edema 9. Neurologically Grossly intact, moving all 4 extremities equally   10. MSK: Normal range of motion    Chart has been reviewed  ______________________________________________________________________________________________  Assessment/Plan 50 y.o. 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 Fib on Eliquis, Anemia, right ankle fracture HLD, HTN    Admitted for a.fib w RVR and hyperglycemia  Present on Admission:  Atrial fibrillation with RVR (HCC) - now back to sinus  rhythm, pt not on rate controlled meds at  baseline, given labile BP that fluctuates, continue eliquis Initially hypertensive now BP stable continue to monitor and treat as needed    Hyperlipidemia - restart Lipitor   CKD (chronic kidney disease) stage 3, GFR 30-59 ml/min (HCC) -  -chronic avoid nephrotoxic medications such as NSAIDs, Vanco Zosyn combo,  avoid hypotension, continue to follow renal function   Blind left eye and glaucoma - continue homemeds   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 ([place]) hold off on initiating of baseline medication at this point continue to monitor treat as needed hypertension/ hypotension  DM2 -  - Order Sensitive  SSI   - continue home insulin regimen   -  check TSH and HgA1C  - Hold by mouth medications    Hyperglycemia -no evidence of DKA.  Restart home medications   Hypertensive urgency -initially currently blood pressure back to baseline  Transient headache in the setting of HTN Treat elevated BP as needed No focal neurological deficit  Debility will have PT/OT assess prior to DC  UTI -  - treat with Rocephin         await results of urine culture and adjust antibiotic coverage as needed  No evidence of renal abscess  Other plan as per orders.  DVT prophylaxis: eliquis      Code Status:    Code Status: Prior FULL CODE   as per patient   I had personally discussed CODE STATUS with patient and family     Family Communication:   Family  at  Bedside  plan of care was discussed   with   Daughter,   Disposition Plan:   To home once workup is complete and patient is stable   Following barriers for discharge:                            Electrolytes corrected                                                            Pain controlled with PO medications                           able to transition to PO antibiotics                             Will need to be able to tolerate PO                            Will likely need home  health                 Would benefit from PT/OT eval prior to DC  Ordered                                         Nutrition    consulted                  Consults called: none  Admission status:  ED Disposition     ED Disposition  Admit   Condition  --   [place]: [place] [[id number]]  Level of Care: Progressive [102]  Admit to Progressive based on following criteria: CARDIOVASCULAR & THORACIC of moderate stability with acute coronary syndrome symptoms/low risk myocardial infarction/hypertensive urgency/arrhythmias/heart failure potentially compromising stability and stable post cardiovascular intervention patients.  May place patient in observation at [facility] or [place] if equivalent level of care is available:: No  Covid Evaluation: Confirmed COVID Negative  Diagnosis: Atrial fibrillation with RVR [facility]) [[id number]]  Admitting Physician: Toy Baker [[id number]]  Attending Physician: Toy Baker [[id number]]           Obs     Level of care progressive tele indefinitely please discontinue once patient no longer qualifies COVID-19 Labs    Lab Results  Component Value Date   [place] 02/03/2021     Precautions: admitted as   Covid Negative   PPE: Used by the provider:   N95  eye Goggles,  Gloves  Anastassia Doutova 02/04/2021, 12:07 AM    [facility]     after 2 AM please page floor coverage PA If 7AM-7PM, please contact the day team taking care of the patient using [email or web address]   Patient was evaluated in the context of the global COVID-19 pandemic, which necessitated consideration that the patient might be at risk for infection with the SARS-CoV-2 virus that causes COVID-19. Institutional protocols and algorithms that pertain to the evaluation of patients at risk for COVID-19 are in a state of rapid change based on  information released by regulatory bodies including the CDC and federal and state  organizations. These policies and algorithms were followed during the patient's care.

## 2021-02-03 NOTE — ED Notes (Addendum)
Patient transported to CT on monitor with RN

## 2021-02-03 NOTE — ED Triage Notes (Signed)
Pt presents with c/o recent UTI diagnosis. Per family, pt was experiencing some confusion earlier today, also been vomiting. Pt is hyperglycemic for EMS, CBG 400. Pt also had a fall on Wednesday of this week, is on xarelto. Daughter reports this is how she has been in the past when she was diagnosed with DKA. Pt also hypertensive for EMS, 200/80. Pt has a 22g left hand IV placed by EMS, 252ml of fluid given.

## 2021-02-03 NOTE — ED Notes (Signed)
Dr. Zenia Resides made aware of delay in obtain lab work. Pt a difficult stick.  I attempted but was unsuccessful obtaining blood work.  Other ED staff to attempt. IV team consult placed as well.

## 2021-02-03 NOTE — ED Provider Notes (Signed)
[facility] DEPT Provider Note   CSN: [id number] Arrival date & time: 02/03/21  1304     History Chief Complaint  Patient presents with   Vomiting    Jennifer [name] is a 50 y.o. female.  50 year old female with history of diabetes who presents with emesis.  Patient diagnosed with UTI recently but has not been compliant with her medications.  Does have a history of DKA before in the past.  Daughter is concerned that that is what is going on at this time.  She is had no reported fever.  Denies any abdominal discomfort.  No cough or congestion.  No antiemetics use prior to arrival.  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  She has not seen a dentist in about one year.  Dentist is on [address].  No fevers chills or systemic symptoms.  Diabetes has been poorly controlled for some time.  She said it has improved recently averaging around 140.  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[place]) 11/29/2020   Orthostatic hypotension 11/22/2020   Lower extremity weakness 11/22/2020   CKD (chronic kidney disease) stage 3, GFR 30-59 ml/min ([place]) 11/22/2020   Depression 08/22/2020   Weight loss, non-intentional 07/04/2020   Adult failure to thrive 07/04/2020   Early satiety 05/31/2020   Bilateral lower extremity edema 06/07/2019   Atrial fibrillation ([place]) 09/20/2018   Migraine    (HFpEF) heart failure with preserved ejection fraction ([place]) 01/01/2018   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   Pseudophakia of right eye 09/16/2017   Left anterior shoulder pain 07/06/2017   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;  Service: Orthopedics;  Laterality: Right;     OB History     Gravida  7   Para  4   Term  4   Preterm  0   AB  3   Living         SAB  3   IAB  0   Ectopic  0   Multiple      Live Births              Family History  Problem Relation Age of Onset   Diabetes Father    Hypertension Other     Social History   Tobacco Use   Smoking status: Never   Smokeless tobacco: Never  Vaping Use   Vaping Use: Never used  Substance Use Topics    Alcohol use: No   Drug use: No    Home Medications Prior to Admission medications   Medication Sig Start Date End Date Taking? Authorizing Provider  Accu-Chek FastClix Lancets MISC Check blood sugar 4 times a day 06/07/19   Jose Persia, MD  ACCU-CHEK GUIDE test strip CHECK BLOOD SUGAR 4 TIMES PER DAY 06/23/20   Mosetta Anis, MD  apixaban (ELIQUIS) 5 MG TABS tablet Take 1 tablet (5 mg total) by mouth 2 (two) times daily. 01/05/21   Mitzi Hansen, MD  atorvastatin (LIPITOR) 40 MG tablet Take 1 tablet (40 mg total) by mouth daily. 01/05/21   Mitzi Hansen, MD  Blood Glucose Monitoring Suppl (ACCU-CHEK GUIDE) w/Device KIT 1 each by Does not apply route 4 (four) times daily. 11/19/18   Jose Persia, MD  brimonidine (ALPHAGAN) 0.2 % ophthalmic solution Place 1 drop into the left eye every 8 (eight) hours. 08/01/20   [provider]  calcium citrate-vitamin D (CITRACAL+D) 315-200 MG-UNIT tablet Take 2 tablets by mouth 2 (two) times daily. 01/05/21 01/05/22  Mitzi Hansen, MD  cephALEXin (KEFLEX) 500 MG capsule Take 1 capsule (500 mg total) by mouth 4 (four) times daily for 5 days. 01/30/21 02/04/21  McDonald, Mia A, PA-C  diclofenac Sodium (VOLTAREN) 1 % GEL Apply 2 g topically 4 (four) times daily as needed (foot pain).    [provider]  dorzolamide-timolol (COSOPT) 22.3-6.8 MG/ML ophthalmic solution Place 1 drop into the left eye 2 (two) times daily. 10/16/20   [provider]  gabapentin (NEURONTIN) 300 MG capsule Take 1 capsule (300 mg total) by mouth 3 (three) times daily. 01/05/21 01/05/22  Mitzi Hansen, MD  insulin aspart (NOVOLOG) 100 UNIT/ML FlexPen Inject 3 Units into the skin 3 (three) times daily with meals. 01/05/21 02/04/21  Mitzi Hansen, MD  insulin detemir (LEVEMIR) 100 UNIT/ML FlexPen Inject 15 Units into the skin at bedtime 01/05/21 02/04/21  Mitzi Hansen, MD  Insulin Pen Needle (B-D UF III MINI PEN NEEDLES) 31G X 5 MM MISC Use pen needle  daily for injections 06/23/20   Mosetta Anis, MD  Insulin Pen Needle (PENTIPS) 32G X 4 MM MISC Use as directed with insulin pen 11/29/20   Harvie Heck, MD  linagliptin (TRADJENTA) 5 MG TABS tablet Take 1 tablet (5 mg total) by mouth daily. 01/05/21 02/04/21  Mitzi Hansen, MD  metFORMIN (GLUCOPHAGE-XR) 500 MG 24 hr tablet TAKE 2 TABLETS (1,000 MG TOTAL) BY MOUTH IN THE MORNING AND AT BEDTIME. 01/23/21 02/22/21  Jose Persia, MD  metoCLOPramide (REGLAN) 5 MG tablet Take 1 tablet (5 mg  total) by mouth 4 (four) times daily -  before meals and at bedtime. 11/29/20 12/29/20  Harvie Heck, MD  Continuous Blood Gluc Sensor (DEXCOM G6 SENSOR) MISC Use to check blood sugar at least 6 times a day Patient not taking: Reported on 08/29/2020 08/09/20 09/27/20  Harvie Heck, MD  Continuous Blood Gluc Transmit (DEXCOM G6 TRANSMITTER) MISC Use to check blood sugar at least 6 times a day Patient not taking: Reported on 08/29/2020 08/09/20 09/27/20  Harvie Heck, MD    Allergies    Patient has no known allergies.  Review of Systems   Review of Systems  All other systems reviewed and are negative.  Physical Exam Updated Vital Signs BP (!) 175/103 (BP Location: Left Arm)   Pulse (!) 130   Temp 97.7 F (36.5 C) (Oral)   Resp 18   LMP 08/21/2016 (Exact Date)   SpO2 98%   Physical Exam Vitals and nursing note reviewed.  Constitutional:      General: She is not in acute distress.    Appearance: Normal appearance. She is well-developed. She is not toxic-appearing.  HENT:     Head: Normocephalic and atraumatic.  Eyes:     General: Lids are normal.     Conjunctiva/sclera: Conjunctivae normal.     Pupils: Pupils are equal, round, and reactive to light.  Neck:     Thyroid: No thyroid mass.     Trachea: No tracheal deviation.  Cardiovascular:     Rate and Rhythm: Tachycardia present. Rhythm irregular.     Heart sounds: Normal heart sounds. No murmur heard.   No gallop.  Pulmonary:     Effort: Pulmonary  effort is normal. No respiratory distress.     Breath sounds: Normal breath sounds. No stridor. No decreased breath sounds, wheezing, rhonchi or rales.  Abdominal:     General: There is no distension.     Palpations: Abdomen is soft.     Tenderness: There is no abdominal tenderness. There is no right CVA tenderness, left CVA tenderness or rebound.  Musculoskeletal:        General: No tenderness. Normal range of motion.     Cervical back: Normal range of motion and neck supple.  Skin:    General: Skin is warm and dry.     Findings: No abrasion or rash.  Neurological:     Mental Status: She is oriented to person, place, and time. Mental status is at baseline. She is lethargic.     GCS: GCS eye subscore is 4. GCS verbal subscore is 5. GCS motor subscore is 6.     Cranial Nerves: Cranial nerves are intact. No cranial nerve deficit.     Sensory: No sensory deficit.     Motor: Motor function is intact.  Psychiatric:        Attention and Perception: Attention normal.        Mood and Affect: Affect is flat.        Speech: Speech normal.        Behavior: Behavior is slowed.    ED Results / Procedures / Treatments   Labs (all labs ordered are listed, but only abnormal results are displayed) Labs Reviewed  CBC WITH DIFFERENTIAL/PLATELET  COMPREHENSIVE METABOLIC PANEL  URINALYSIS, ROUTINE W REFLEX MICROSCOPIC  BLOOD GAS, VENOUS  I-STAT BETA HCG BLOOD, ED ([facility], [facility], [facility] ONLY)    EKG EKG Interpretation  Date/Time:  Saturday February 03 2021 13:48:16 EDT Ventricular Rate:  129 PR Interval:    QRS  Duration: 86 QT Interval:  337 QTC Calculation: 494 R Axis:   170 Text Interpretation: Atrial fibrillation Right axis deviation Abnormal R-wave progression, early transition Abnormal lateral Q waves ST depr, consider ischemia, anterolateral lds Confirmed by Lacretia Leigh ([id number]) on 02/03/2021 2:43:38 PM  Radiology No results found.  Procedures Procedures   Medications Ordered in  ED Medications  sodium chloride 0.9 % bolus 1,000 mL (has no administration in time range)  0.9 %  sodium chloride infusion (has no administration in time range)    ED Course  I have reviewed the triage vital signs and the nursing notes.  Pertinent labs & imaging results that were available during my care of the patient were reviewed by me and considered in my medical decision making (see chart for details).    MDM Rules/Calculators/A&P                           Placed on Cardizem drip for A. fib with RVR.  Heart rate did improve slightly.  It is unknown when the patient went back into A. fib or if she is taking her Eliquis.  Was given IV fluids here.  Mild hyperglycemia noted with sugar of 336.  Will recheck after fluids.  May require insulin coverage.  Required readmission to the hospital   CRITICAL CARE Performed by: Leota Jacobsen Total critical care time: 50 minutes Critical care time was exclusive of separately billable procedures and treating other patients. Critical care was necessary to treat or prevent imminent or life-threatening deterioration. Critical care was time spent personally by me on the following activities: development of treatment plan with patient and/or surrogate as well as nursing, discussions with consultants, evaluation of patient's response to treatment, examination of patient, obtaining history from patient or surrogate, ordering and performing treatments and interventions, ordering and review of laboratory studies, ordering and review of radiographic studies, pulse oximetry and re-evaluation of patient's condition.  Final Clinical Impression(s) / ED Diagnoses Final diagnoses:  None    Rx / DC Orders ED Discharge Orders     None        Lacretia Leigh, MD 02/03/21 1749

## 2021-02-04 ENCOUNTER — Observation Stay (HOSPITAL_COMMUNITY): Payer: Medicaid Other

## 2021-02-04 DIAGNOSIS — Z833 Family history of diabetes mellitus: Secondary | ICD-10-CM | POA: Diagnosis not present

## 2021-02-04 DIAGNOSIS — Z794 Long term (current) use of insulin: Secondary | ICD-10-CM | POA: Diagnosis not present

## 2021-02-04 DIAGNOSIS — R778 Other specified abnormalities of plasma proteins: Secondary | ICD-10-CM | POA: Diagnosis not present

## 2021-02-04 DIAGNOSIS — I480 Paroxysmal atrial fibrillation: Secondary | ICD-10-CM | POA: Diagnosis present

## 2021-02-04 DIAGNOSIS — Z20822 Contact with and (suspected) exposure to covid-19: Secondary | ICD-10-CM | POA: Diagnosis present

## 2021-02-04 DIAGNOSIS — E1143 Type 2 diabetes mellitus with diabetic autonomic (poly)neuropathy: Secondary | ICD-10-CM | POA: Diagnosis not present

## 2021-02-04 DIAGNOSIS — Z8616 Personal history of COVID-19: Secondary | ICD-10-CM | POA: Diagnosis not present

## 2021-02-04 DIAGNOSIS — I4891 Unspecified atrial fibrillation: Secondary | ICD-10-CM | POA: Diagnosis not present

## 2021-02-04 DIAGNOSIS — N390 Urinary tract infection, site not specified: Secondary | ICD-10-CM | POA: Diagnosis present

## 2021-02-04 DIAGNOSIS — H5462 Unqualified visual loss, left eye, normal vision right eye: Secondary | ICD-10-CM | POA: Diagnosis present

## 2021-02-04 DIAGNOSIS — E1122 Type 2 diabetes mellitus with diabetic chronic kidney disease: Secondary | ICD-10-CM | POA: Diagnosis present

## 2021-02-04 DIAGNOSIS — E1142 Type 2 diabetes mellitus with diabetic polyneuropathy: Secondary | ICD-10-CM | POA: Diagnosis present

## 2021-02-04 DIAGNOSIS — N1831 Chronic kidney disease, stage 3a: Secondary | ICD-10-CM | POA: Diagnosis present

## 2021-02-04 DIAGNOSIS — G43919 Migraine, unspecified, intractable, without status migrainosus: Secondary | ICD-10-CM

## 2021-02-04 DIAGNOSIS — I214 Non-ST elevation (NSTEMI) myocardial infarction: Secondary | ICD-10-CM | POA: Diagnosis present

## 2021-02-04 DIAGNOSIS — D696 Thrombocytopenia, unspecified: Secondary | ICD-10-CM | POA: Diagnosis present

## 2021-02-04 DIAGNOSIS — R739 Hyperglycemia, unspecified: Secondary | ICD-10-CM | POA: Diagnosis present

## 2021-02-04 DIAGNOSIS — E785 Hyperlipidemia, unspecified: Secondary | ICD-10-CM | POA: Diagnosis present

## 2021-02-04 DIAGNOSIS — Z993 Dependence on wheelchair: Secondary | ICD-10-CM | POA: Diagnosis not present

## 2021-02-04 DIAGNOSIS — I160 Hypertensive urgency: Secondary | ICD-10-CM | POA: Diagnosis present

## 2021-02-04 DIAGNOSIS — I130 Hypertensive heart and chronic kidney disease with heart failure and stage 1 through stage 4 chronic kidney disease, or unspecified chronic kidney disease: Secondary | ICD-10-CM | POA: Diagnosis present

## 2021-02-04 DIAGNOSIS — D638 Anemia in other chronic diseases classified elsewhere: Secondary | ICD-10-CM | POA: Diagnosis present

## 2021-02-04 DIAGNOSIS — E1165 Type 2 diabetes mellitus with hyperglycemia: Secondary | ICD-10-CM | POA: Diagnosis present

## 2021-02-04 DIAGNOSIS — Z79899 Other long term (current) drug therapy: Secondary | ICD-10-CM | POA: Diagnosis not present

## 2021-02-04 DIAGNOSIS — I5032 Chronic diastolic (congestive) heart failure: Secondary | ICD-10-CM | POA: Diagnosis present

## 2021-02-04 DIAGNOSIS — Z7901 Long term (current) use of anticoagulants: Secondary | ICD-10-CM | POA: Diagnosis not present

## 2021-02-04 DIAGNOSIS — Z8249 Family history of ischemic heart disease and other diseases of the circulatory system: Secondary | ICD-10-CM | POA: Diagnosis not present

## 2021-02-04 DIAGNOSIS — Z7984 Long term (current) use of oral hypoglycemic drugs: Secondary | ICD-10-CM | POA: Diagnosis not present

## 2021-02-04 DIAGNOSIS — G43909 Migraine, unspecified, not intractable, without status migrainosus: Secondary | ICD-10-CM | POA: Diagnosis present

## 2021-02-04 HISTORY — DX: Non-ST elevation (NSTEMI) myocardial infarction: I21.4

## 2021-02-04 LAB — LIPID PANEL
Cholesterol: 212 mg/dL — ABNORMAL HIGH (ref 0–200)
HDL: 54 mg/dL (ref >40)
LDL Cholesterol: 142 mg/dL — ABNORMAL HIGH (ref 0–99)
Total CHOL/HDL Ratio: 3.9 RATIO
Triglycerides: 81 mg/dL (ref <150)
VLDL: 16 mg/dL (ref 0–40)

## 2021-02-04 LAB — CBC WITH DIFFERENTIAL/PLATELET
Abs Immature Granulocytes: 0.01 10*3/uL (ref 0.00–0.07)
Abs Immature Granulocytes: 0.02 10*3/uL (ref 0.00–0.07)
Basophils Absolute: 0 10*3/uL (ref 0.0–0.1)
Basophils Absolute: 0 10*3/uL (ref 0.0–0.1)
Basophils Relative: 0 %
Basophils Relative: 0 %
Eosinophils Absolute: 0.1 10*3/uL (ref 0.0–0.5)
Eosinophils Absolute: 0.1 10*3/uL (ref 0.0–0.5)
Eosinophils Relative: 2 %
Eosinophils Relative: 1 %
HCT: 29.9 % — ABNORMAL LOW (ref 36.0–46.0)
HCT: 33.7 % — ABNORMAL LOW (ref 36.0–46.0)
Hemoglobin: 9.9 g/dL — ABNORMAL LOW (ref 12.0–15.0)
Hemoglobin: 10.9 g/dL — ABNORMAL LOW (ref 12.0–15.0)
Immature Granulocytes: 0 %
Immature Granulocytes: 0 %
Lymphocytes Relative: 44 %
Lymphocytes Relative: 48 %
Lymphs Abs: 2.3 10*3/uL (ref 0.7–4.0)
Lymphs Abs: 3.4 10*3/uL (ref 0.7–4.0)
MCH: 28.4 pg (ref 26.0–34.0)
MCH: 27.8 pg (ref 26.0–34.0)
MCHC: 33.1 g/dL (ref 30.0–36.0)
MCHC: 32.3 g/dL (ref 30.0–36.0)
MCV: 85.7 fL (ref 80.0–100.0)
MCV: 86 fL (ref 80.0–100.0)
Monocytes Absolute: 0.3 10*3/uL (ref 0.1–1.0)
Monocytes Absolute: 0.3 10*3/uL (ref 0.1–1.0)
Monocytes Relative: 6 %
Monocytes Relative: 4 %
Neutro Abs: 2.5 10*3/uL (ref 1.7–7.7)
Neutro Abs: 3.4 10*3/uL (ref 1.7–7.7)
Neutrophils Relative %: 48 %
Neutrophils Relative %: 47 %
Platelets: 146 10*3/uL — ABNORMAL LOW (ref 150–400)
Platelets: 164 10*3/uL (ref 150–400)
RBC: 3.49 MIL/uL — ABNORMAL LOW (ref 3.87–5.11)
RBC: 3.92 MIL/uL (ref 3.87–5.11)
RDW: 12 % (ref 11.5–15.5)
RDW: 12.1 % (ref 11.5–15.5)
WBC: 5.3 10*3/uL (ref 4.0–10.5)
WBC: 7.2 10*3/uL (ref 4.0–10.5)
nRBC: 0 % (ref 0.0–0.2)
nRBC: 0 % (ref 0.0–0.2)

## 2021-02-04 LAB — COMPREHENSIVE METABOLIC PANEL
ALT: 10 U/L (ref 0–44)
ALT: 11 U/L (ref 0–44)
AST: 22 U/L (ref 15–41)
AST: 22 U/L (ref 15–41)
Albumin: 2.9 g/dL — ABNORMAL LOW (ref 3.5–5.0)
Albumin: 2.8 g/dL — ABNORMAL LOW (ref 3.5–5.0)
Alkaline Phosphatase: 61 U/L (ref 38–126)
Alkaline Phosphatase: 55 U/L (ref 38–126)
Anion gap: 7 (ref 5–15)
Anion gap: 9 (ref 5–15)
BUN: 34 mg/dL — ABNORMAL HIGH (ref 6–20)
BUN: 27 mg/dL — ABNORMAL HIGH (ref 6–20)
CO2: 25 mmol/L (ref 22–32)
CO2: 19 mmol/L — ABNORMAL LOW (ref 22–32)
Calcium: 8.8 mg/dL — ABNORMAL LOW (ref 8.9–10.3)
Calcium: 9 mg/dL (ref 8.9–10.3)
Chloride: 106 mmol/L (ref 98–111)
Chloride: 111 mmol/L (ref 98–111)
Creatinine, Ser: 1.08 mg/dL — ABNORMAL HIGH (ref 0.44–1.00)
Creatinine, Ser: 0.88 mg/dL (ref 0.44–1.00)
GFR, Estimated: >60 mL/min (ref >60)
GFR, Estimated: >60 mL/min (ref >60)
Glucose, Bld: 344 mg/dL — ABNORMAL HIGH (ref 70–99)
Glucose, Bld: 146 mg/dL — ABNORMAL HIGH (ref 70–99)
Potassium: 3.6 mmol/L (ref 3.5–5.1)
Potassium: 3.8 mmol/L (ref 3.5–5.1)
Sodium: 138 mmol/L (ref 135–145)
Sodium: 139 mmol/L (ref 135–145)
Total Bilirubin: 0.3 mg/dL (ref 0.3–1.2)
Total Bilirubin: 0.4 mg/dL (ref 0.3–1.2)
Total Protein: 6.2 g/dL — ABNORMAL LOW (ref 6.5–8.1)
Total Protein: 6.3 g/dL — ABNORMAL LOW (ref 6.5–8.1)

## 2021-02-04 LAB — IRON AND TIBC
Iron: 36 ug/dL (ref 28–170)
Saturation Ratios: 16 % (ref 10.4–31.8)
TIBC: 219 ug/dL — ABNORMAL LOW (ref 250–450)
UIBC: 183 ug/dL

## 2021-02-04 LAB — ECHOCARDIOGRAM COMPLETE
AV Mean grad: 4 mmHg
AV Peak grad: 7.2 mmHg
Ao pk vel: 1.34 m/s
Area-P 1/2: 5.97 cm2
Height: 64 in
S' Lateral: 2.8 cm
Weight: 2560 oz

## 2021-02-04 LAB — TROPONIN I (HIGH SENSITIVITY)
Troponin I (High Sensitivity): 2121 ng/L (ref <18)
Troponin I (High Sensitivity): 2170 ng/L (ref <18)
Troponin I (High Sensitivity): 2176 ng/L (ref <18)
Troponin I (High Sensitivity): 1507 ng/L (ref <18)

## 2021-02-04 LAB — PHOSPHORUS: Phosphorus: 3.9 mg/dL (ref 2.5–4.6)

## 2021-02-04 LAB — HEMOGLOBIN A1C
Hgb A1c MFr Bld: 12.1 % — ABNORMAL HIGH (ref 4.8–5.6)
Mean Plasma Glucose: 300.57 mg/dL

## 2021-02-04 LAB — GLUCOSE, CAPILLARY
Glucose-Capillary: 114 mg/dL — ABNORMAL HIGH (ref 70–99)
Glucose-Capillary: 153 mg/dL — ABNORMAL HIGH (ref 70–99)
Glucose-Capillary: 189 mg/dL — ABNORMAL HIGH (ref 70–99)
Glucose-Capillary: 195 mg/dL — ABNORMAL HIGH (ref 70–99)
Glucose-Capillary: 200 mg/dL — ABNORMAL HIGH (ref 70–99)
Glucose-Capillary: 318 mg/dL — ABNORMAL HIGH (ref 70–99)
Glucose-Capillary: 65 mg/dL — ABNORMAL LOW (ref 70–99)

## 2021-02-04 LAB — MAGNESIUM
Magnesium: 1.9 mg/dL (ref 1.7–2.4)
Magnesium: 1.8 mg/dL (ref 1.7–2.4)

## 2021-02-04 LAB — TSH: TSH: 1.284 u[IU]/mL (ref 0.350–4.500)

## 2021-02-04 LAB — HEPARIN LEVEL (UNFRACTIONATED): Heparin Unfractionated: >1.1 IU/mL — ABNORMAL HIGH (ref 0.30–0.70)

## 2021-02-04 LAB — APTT
aPTT: 29 seconds (ref 24–36)
aPTT: 38 seconds — ABNORMAL HIGH (ref 24–36)

## 2021-02-04 LAB — PROTIME-INR
INR: 1.2 (ref 0.8–1.2)
Prothrombin Time: 15.2 seconds (ref 11.4–15.2)

## 2021-02-04 LAB — FOLATE: Folate: 13.4 ng/mL (ref >5.9)

## 2021-02-04 LAB — FERRITIN: Ferritin: 49 ng/mL (ref 11–307)

## 2021-02-04 LAB — VITAMIN B12: Vitamin B-12: 350 pg/mL (ref 180–914)

## 2021-02-04 MED ORDER — KETOROLAC TROMETHAMINE 30 MG/ML IJ SOLN
30.0000 mg | Freq: Four times a day (QID) | INTRAMUSCULAR | Status: DC | PRN
  Filled 2021-02-04: qty 1

## 2021-02-04 MED ORDER — HYDRALAZINE HCL 20 MG/ML IJ SOLN
10.0000 mg | Freq: Four times a day (QID) | INTRAMUSCULAR | Status: DC | PRN
  Administered 2021-02-04: 10 mg via INTRAVENOUS
  Filled 2021-02-04: qty 1

## 2021-02-04 MED ORDER — PROCHLORPERAZINE EDISYLATE 10 MG/2ML IJ SOLN: 10.0000 mg | Freq: Four times a day (QID) | INTRAMUSCULAR | Status: DC | PRN

## 2021-02-04 MED ORDER — METOPROLOL TARTRATE 5 MG/5ML IV SOLN
5.0000 mg | Freq: Three times a day (TID) | INTRAVENOUS | Status: DC
  Administered 2021-02-04: 5 mg via INTRAVENOUS
  Filled 2021-02-04: qty 5

## 2021-02-04 MED ORDER — AMLODIPINE BESYLATE 2.5 MG PO TABS
2.5000 mg | ORAL_TABLET | Freq: Two times a day (BID) | ORAL | Status: DC
Start: 2021-02-04 — End: 2021-02-06
  Administered 2021-02-04 – 2021-02-06 (×5): 2.5 mg via ORAL
  Filled 2021-02-04 (×5): qty 1

## 2021-02-04 MED ORDER — INSULIN DETEMIR 100 UNIT/ML ~~LOC~~ SOLN
7.0000 [IU] | Freq: Every day | SUBCUTANEOUS | Status: DC
  Administered 2021-02-04 – 2021-02-05 (×2): 7 [IU] via SUBCUTANEOUS
  Filled 2021-02-04 (×4): qty 0.07

## 2021-02-04 MED ORDER — INSULIN ASPART 100 UNIT/ML IJ SOLN
0.0000 [IU] | INTRAMUSCULAR | Status: DC
  Administered 2021-02-04: 1 [IU] via SUBCUTANEOUS
  Administered 2021-02-05: 2 [IU] via SUBCUTANEOUS
  Administered 2021-02-05 – 2021-02-06 (×2): 4 [IU] via SUBCUTANEOUS
  Administered 2021-02-06: 2 [IU] via SUBCUTANEOUS
  Administered 2021-02-06: 1 [IU] via SUBCUTANEOUS
  Administered 2021-02-06: 2 [IU] via SUBCUTANEOUS
  Administered 2021-02-06: 1 [IU] via SUBCUTANEOUS

## 2021-02-04 MED ORDER — FLUCONAZOLE IN SODIUM CHLORIDE 200-0.9 MG/100ML-% IV SOLN
200.0000 mg | Freq: Every day | INTRAVENOUS | Status: DC
  Administered 2021-02-04 – 2021-02-06 (×3): 200 mg via INTRAVENOUS
  Filled 2021-02-04 (×3): qty 100

## 2021-02-04 MED ORDER — PROCHLORPERAZINE EDISYLATE 10 MG/2ML IJ SOLN
10.0000 mg | Freq: Once | INTRAMUSCULAR | Status: AC
  Administered 2021-02-04: 10 mg via INTRAVENOUS
  Filled 2021-02-04: qty 2

## 2021-02-04 MED ORDER — DEXTROSE 50 % IV SOLN
INTRAVENOUS | Status: AC
  Administered 2021-02-04: 25 mL
  Filled 2021-02-04: qty 50

## 2021-02-04 MED ORDER — HEPARIN (PORCINE) 25000 UT/250ML-% IV SOLN
1300.0000 [IU]/h | INTRAVENOUS | Status: DC
  Administered 2021-02-04: 900 [IU]/h via INTRAVENOUS
  Filled 2021-02-04: qty 250

## 2021-02-04 MED ORDER — LOSARTAN POTASSIUM 25 MG PO TABS
25.0000 mg | ORAL_TABLET | Freq: Every day | ORAL | Status: DC
  Administered 2021-02-04 – 2021-02-06 (×3): 25 mg via ORAL
  Filled 2021-02-04 (×3): qty 1

## 2021-02-04 NOTE — Consult Note (Signed)
Referring Physician: Irine Seal  Jennifer [name] is an 50 y.o. female.                       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 Her Troponin I are mildly elevated. She has medication non-compliance. Her LDL cholesterol is 142 mg. Her EKG today and chest x-ray on admission are unremarkable.  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  She has not seen a dentist in about one year.  Dentist is on [address].  No fevers chills or systemic symptoms.  Diabetes has been poorly controlled for some time.  She said it has improved recently averaging around 140.  Called the dentist said they would not see patients until May 14th but the urgency o   Pap smear abnormality of cervix with LGSIL    Routine/ritual circumcision    Type II diabetes mellitus ([place])    Uveitis       Past Surgical History:  Procedure Laterality Date   CESAREAN SECTION     x 1   EYE SURGERY Bilateral    Lasik    MULTIPLE TOOTH EXTRACTIONS     bottom denture   ORIF ANKLE FRACTURE Right 05/05/2020   Procedure: OPEN REDUCTION INTERNAL FIXATION (ORIF) ANKLE FRACTURE;  Surgeon: Shona Needles, MD;  Location: [facility];  Service: Orthopedics;  Laterality: Right;    Family History  Problem Relation Age of Onset   Diabetes Father    Hypertension Other    Social History:  reports that she has never smoked. She has never used smokeless tobacco. She reports that she does not drink alcohol and does not use drugs.  Allergies: No Known Allergies  Medications Prior to Admission  Medication Sig Dispense Refill   apixaban (ELIQUIS) 5 MG TABS tablet Take 1 tablet (5 mg total) by mouth 2 (two) times daily. 180 tablet 0   atorvastatin (LIPITOR) 40 MG tablet Take 1 tablet (40 mg total) by mouth daily. 90 tablet 3   brimonidine (ALPHAGAN) 0.2 % ophthalmic solution Place 1 drop into the left eye every 8 (eight) hours.     calcium citrate-vitamin D (CITRACAL+D) 315-200 MG-UNIT tablet Take 2 tablets by mouth 2 (two) times daily. 360 tablet 3   cephALEXin (KEFLEX) 500 MG capsule Take 1 capsule (500 mg total) by mouth 4 (four) times daily for 5 days. 20 capsule 0   diclofenac Sodium (VOLTAREN) 1 % GEL Apply 2  g topically 4 (four) times daily as needed (foot pain).     dorzolamide-timolol (COSOPT) 22.3-6.8 MG/ML ophthalmic solution Place 1 drop into the left eye 2 (two) times daily.     gabapentin (NEURONTIN) 300 MG capsule Take 1 capsule (300 mg total) by mouth 3 (three) times daily. 270 capsule 3   insulin aspart (NOVOLOG) 100 UNIT/ML FlexPen Inject 3 Units into the skin 3 (three) times daily with meals. 80 mL 0   insulin detemir (LEVEMIR) 100 UNIT/ML FlexPen Inject 15 Units into the skin at bedtime 15 mL 0   linagliptin (TRADJENTA) 5 MG TABS tablet Take 1 tablet (5 mg total) by mouth daily. 90 tablet 0   metFORMIN (GLUCOPHAGE-XR) 500 MG 24 hr tablet TAKE 2 TABLETS (1,000 MG TOTAL) BY MOUTH IN THE MORNING AND AT BEDTIME.  360 tablet 1   Accu-Chek FastClix Lancets MISC Check blood sugar 4 times a day 200 each 7   ACCU-CHEK GUIDE test strip CHECK BLOOD SUGAR 4 TIMES PER DAY 150 strip 12   Blood Glucose Monitoring Suppl (ACCU-CHEK GUIDE) w/Device KIT 1 each by Does not apply route 4 (four) times daily. 1 kit 1   Insulin Pen Needle (B-D UF III MINI PEN NEEDLES) 31G X 5 MM MISC Use pen needle daily for injections 100 each 2   Insulin Pen Needle (PENTIPS) 32G X 4 MM MISC Use as directed with insulin pen 100 each 3   metoCLOPramide (REGLAN) 5 MG tablet Take 1 tablet (5 mg total) by mouth 4 (four) times daily -  before meals and at bedtime. 120 tablet 0    Results for orders placed or performed during the hospital encounter of 02/03/21 (from the past 48 hour(s))  Resp Panel by RT-PCR (Flu A&B, Covid) Nasopharyngeal Swab     Status: None   Collection Time: 02/03/21  1:40 PM   Specimen: Nasopharyngeal Swab; Nasopharyngeal(NP) swabs in vial transport medium  Result Value Ref Range   SARS Coronavirus 2 by RT PCR NEGATIVE NEGATIVE    Comment: (NOTE) SARS-CoV-2 target nucleic acids are NOT DETECTED.  The SARS-CoV-2 RNA is generally detectable in upper respiratory specimens during the acute phase of infection. The lowest concentration of SARS-CoV-2 viral copies this assay can detect is 138 copies/mL. A negative result does not preclude SARS-Cov-2 infection and should not be used as the sole basis for treatment or other patient management decisions. A negative result may occur with  improper specimen collection/handling, submission of specimen other than nasopharyngeal swab, presence of viral mutation(s) within the areas targeted by this assay, and inadequate number of viral copies(<138 copies/mL). A negative result must be combined with clinical observations, patient history, and epidemiological information. The expected result is Negative.  Fact Sheet for Patients:  EntrepreneurPulse.com.au  Fact  Sheet for Healthcare Providers:  IncredibleEmployment.be  This test is no t yet approved or cleared by the [place] FDA and  has been authorized for detection and/or diagnosis of SARS-CoV-2 by FDA under an Emergency Use Authorization (EUA). This EUA will remain  in effect (meaning this test can be used) for the duration of the COVID-19 declaration under Section 564(b)(1) of the Act, 21 U.S.C.section 360bbb-3(b)(1), unless the authorization is terminated  or revoked sooner.       Influenza A by PCR NEGATIVE NEGATIVE   Influenza B by PCR NEGATIVE NEGATIVE    Comment: (NOTE) The Xpert Xpress SARS-CoV-2/FLU/RSV plus assay is intended as an aid in the diagnosis of influenza from Nasopharyngeal swab specimens and should  not be used as a sole basis for treatment. Nasal washings and aspirates are unacceptable for Xpert Xpress SARS-CoV-2/FLU/RSV testing.  Fact Sheet for Patients: EntrepreneurPulse.com.au  Fact Sheet for Healthcare Providers: IncredibleEmployment.be  This test is not yet approved or cleared by the [place] FDA and has been authorized for detection and/or diagnosis of SARS-CoV-2 by FDA under an Emergency Use Authorization (EUA). This EUA will remain in effect (meaning this test can be used) for the duration of the COVID-19 declaration under Section 564(b)(1) of the Act, 21 U.S.C. section 360bbb-3(b)(1), unless the authorization is terminated or revoked.  Performed at [facility], [place] [address], [place], [place] [address]   CBC with Differential/Platelet     Status: None   Collection Time: 02/03/21  3:47 PM  Result Value Ref Range   WBC 5.1 4.0 - 10.5 K/uL   RBC 4.56 3.87 - 5.11 MIL/uL   Hemoglobin 12.7 12.0 - 15.0 g/dL   HCT 39.6 36.0 - 46.0 %   MCV 86.8 80.0 - 100.0 fL   MCH 27.9 26.0 - 34.0 pg   MCHC 32.1 30.0 - 36.0 g/dL   RDW 11.9 11.5 - 15.5 %   Platelets 163 150 - 400 K/uL    nRBC 0.0 0.0 - 0.2 %   Neutrophils Relative % 71 %   Neutro Abs 3.6 1.7 - 7.7 K/uL   Lymphocytes Relative 24 %   Lymphs Abs 1.2 0.7 - 4.0 K/uL   Monocytes Relative 3 %   Monocytes Absolute 0.2 0.1 - 1.0 K/uL   Eosinophils Relative 1 %   Eosinophils Absolute 0.0 0.0 - 0.5 K/uL   Basophils Relative 1 %   Basophils Absolute 0.0 0.0 - 0.1 K/uL   Immature Granulocytes 0 %   Abs Immature Granulocytes 0.02 0.00 - 0.07 K/uL    Comment: Performed at [facility], [place] [address], [place], [place] [address]  Comprehensive metabolic panel     Status: Abnormal   Collection Time: 02/03/21  3:47 PM  Result Value Ref Range   Sodium 134 (L) 135 - 145 mmol/L   Potassium 5.1 3.5 - 5.1 mmol/L   Chloride 103 98 - 111 mmol/L   CO2 19 (L) 22 - 32 mmol/L   Glucose, Bld 336 (H) 70 - 99 mg/dL    Comment: Glucose reference range applies only to samples taken after fasting for at least 8 hours.   BUN 28 (H) 6 - 20 mg/dL   Creatinine, Ser 1.00 0.44 - 1.00 mg/dL   Calcium 9.3 8.9 - 10.3 mg/dL   Total Protein 7.8 6.5 - 8.1 g/dL   Albumin 3.5 3.5 - 5.0 g/dL   AST 28 15 - 41 U/L   ALT 13 0 - 44 U/L   Alkaline Phosphatase 81 38 - 126 U/L   Total Bilirubin 1.3 (H) 0.3 - 1.2 mg/dL   GFR, Estimated >60 >60 mL/min    Comment: (NOTE) Calculated using the CKD-EPI Creatinine Equation (2021)    Anion gap 12 5 - 15    Comment: Performed at [facility], [place] [address], [place], [place] [address]  Urinalysis, Routine w reflex microscopic     Status: Abnormal   Collection Time: 02/03/21  3:47 PM  Result Value Ref Range   Color, Urine YELLOW YELLOW   APPearance HAZY (A) CLEAR   Specific Gravity, Urine 1.021 1.005 - 1.030   pH 5.0 5.0 - 8.0   Glucose, UA >=500 (A) NEGATIVE mg/dL  Hgb urine dipstick SMALL (A) NEGATIVE   Bilirubin Urine NEGATIVE NEGATIVE   Ketones, ur 5 (A) NEGATIVE mg/dL   Protein, ur 100 (A) NEGATIVE mg/dL   Nitrite NEGATIVE NEGATIVE   Leukocytes,Ua  SMALL (A) NEGATIVE   RBC / HPF 11-20 0 - 5 RBC/hpf   WBC, UA 21-50 0 - 5 WBC/hpf   Bacteria, UA NONE SEEN NONE SEEN   Squamous Epithelial / LPF 0-5 0 - 5   Mucus PRESENT    Budding Yeast PRESENT    Hyaline Casts, UA PRESENT    Non Squamous Epithelial 0-5 (A) NONE SEEN    Comment: Performed at [facility], [place] [address] [place], [place] [address]  Blood gas, venous     Status: Abnormal   Collection Time: 02/03/21  3:47 PM  Result Value Ref Range   pH, Ven 7.249 (L) 7.250 - 7.430   pCO2, Ven 52.2 44.0 - 60.0 mmHg   pO2, Ven 32.4 32.0 - 45.0 mmHg   Bicarbonate 22.0 20.0 - 28.0 mmol/L   Acid-base deficit 5.1 (H) 0.0 - 2.0 mmol/L   O2 Saturation 50.9 %   Patient temperature 98.6     Comment: Performed at [facility], [place] [address], [place], [place] [address]  I-Stat beta hCG blood, ED ([facility], [facility], AP only)     Status: None   Collection Time: 02/03/21  3:52 PM  Result Value Ref Range   I-stat hCG, quantitative <5.0 <5 mIU/mL   Comment 3            Comment:   GEST. AGE      CONC.  (mIU/mL)   <=1 WEEK        5 - 50     2 WEEKS       50 - 500     3 WEEKS       100 - 10,000     4 WEEKS     1,000 - 30,000        FEMALE AND NON-PREGNANT FEMALE:     LESS THAN 5 mIU/mL   Glucose, capillary     Status: Abnormal   Collection Time: 02/03/21 10:09 PM  Result Value Ref Range   Glucose-Capillary 278 (H) 70 - 99 mg/dL    Comment: Glucose reference range applies only to samples taken after fasting for at least 8 hours.  Lactic acid, plasma     Status: None   Collection Time: 02/03/21 10:12 PM  Result Value Ref Range   Lactic Acid, Venous 1.4 0.5 - 1.9 mmol/L    Comment: Performed at [facility], [place] [address] [place],  [address]  Basic metabolic panel     Status: Abnormal   Collection Time: 02/03/21 10:12 PM  Result Value Ref Range   Sodium 137 135 - 145 mmol/L   Potassium 4.0 3.5 - 5.1 mmol/L    Comment: DELTA CHECK NOTED    Chloride 104 98 - 111 mmol/L   CO2 22 22 - 32 mmol/L   Glucose, Bld 313 (H) 70 - 99 mg/dL    Comment: Glucose reference range applies only to samples taken after fasting for at least 8 hours.   BUN 34 (H) 6 - 20 mg/dL   Creatinine, Ser 1.14 (H) 0.44 - 1.00 mg/dL   Calcium 8.9 8.9 - 10.3 mg/dL   GFR, Estimated 59 (L) >60 mL/min    Comment: (NOTE) Calculated using the CKD-EPI Creatinine Equation (2021)    Anion  gap 11 5 - 15    Comment: Performed at [facility], [place] [address], [place], [place] [address]  Beta-hydroxybutyric acid     Status: Abnormal   Collection Time: 02/03/21 10:12 PM  Result Value Ref Range   Beta-Hydroxybutyric Acid 1.34 (H) 0.05 - 0.27 mmol/L    Comment: Performed at [facility], [place] [address], [place], [place] [address]  Magnesium     Status: None   Collection Time: 02/03/21 10:12 PM  Result Value Ref Range   Magnesium 1.9 1.7 - 2.4 mg/dL    Comment: Performed at [facility], [place] [address], [place], [place] [address]  Phosphorus     Status: Abnormal   Collection Time: 02/03/21 10:12 PM  Result Value Ref Range   Phosphorus 4.9 (H) 2.5 - 4.6 mg/dL    Comment: Performed at [facility], [place] [address] [place], [place] [address]  CK     Status: None   Collection Time: 02/03/21 10:12 PM  Result Value Ref Range   Total CK 116 38 - 234 U/L    Comment: Performed at [facility], [place] [address] [place], [place] [address]  Troponin I (High Sensitivity)     Status: Abnormal   Collection Time: 02/03/21 10:12 PM  Result Value Ref Range   Troponin I (High Sensitivity) 1,611 (HH) <18 ng/L    Comment: CRITICAL RESULT CALLED TO, READ BACK BY AND VERIFIED WITH: TADDEO J _0  BY BATTLET (NOTE) Elevated high sensitivity troponin I (hsTnI) values and significant  changes across serial measurements may suggest ACS but many other  chronic and acute conditions are known to  elevate hsTnI results.  Refer to the Links section for chest pain algorithms and additional  guidance. Performed at [facility], [place] [address] [place], [place] [address]   Hemoglobin A1c     Status: Abnormal   Collection Time: 02/03/21 10:12 PM  Result Value Ref Range   Hgb A1c MFr Bld 12.1 (H) 4.8 - 5.6 %    Comment: (NOTE) Pre diabetes:          5.7%-6.4%  Diabetes:              >6.4%  Glycemic control for   <7.0% adults with diabetes    Mean Plasma Glucose 300.57 mg/dL    Comment: Performed at [place] [address], [place], [place] [address]  Troponin I (High Sensitivity)     Status: Abnormal   Collection Time: 02/04/21 12:13 AM  Result Value Ref Range   Troponin I (High Sensitivity) 2,121 (HH) <18 ng/L    Comment: DELTA CHECK NOTED CRITICAL RESULT CALLED TO, READ BACK BY AND VERIFIED WITH: TADDEO J _1  BY BATTLET (NOTE) Elevated high sensitivity troponin I (hsTnI) values and significant  changes across serial measurements may suggest ACS but many other  chronic and acute conditions are known to elevate hsTnI results.  Refer to the Links section for chest pain algorithms and additional  guidance. Performed at [facility], [place] [address] [place], [place] [address]   Glucose, capillary     Status: Abnormal   Collection Time: 02/04/21 12:42 AM  Result Value Ref Range   Glucose-Capillary 318 (H) 70 - 99 mg/dL    Comment: Glucose reference range applies only to samples taken after fasting for at least 8 hours.   Comment 1 Notify RN   Magnesium     Status: None  Collection Time: 02/04/21  2:15 AM  Result Value Ref Range   Magnesium 1.9 1.7 - 2.4 mg/dL    Comment: Performed at [facility], [place] [address], [place], [place] [address]  Phosphorus     Status: None   Collection Time: 02/04/21  2:15 AM  Result Value Ref Range   Phosphorus 3.9 2.5 - 4.6 mg/dL    Comment: Performed at [facility], [place] [address] [place], [place] [address]  TSH     Status: None   Collection Time: 02/04/21  2:15 AM  Result Value Ref Range   TSH 1.284 0.350 - 4.500 uIU/mL    Comment: Performed by a 3rd Generation assay with a functional sensitivity of <=0.01 uIU/mL. Performed at [facility], [place] [address] [place], [place] [address]   Comprehensive metabolic panel     Status: Abnormal   Collection Time: 02/04/21  2:15 AM  Result Value Ref Range   Sodium 138 135 - 145 mmol/L   Potassium 3.6 3.5 - 5.1 mmol/L   Chloride 106 98 - 111 mmol/L   CO2 25 22 - 32 mmol/L   Glucose, Bld 344 (H) 70 - 99 mg/dL    Comment: Glucose reference range applies only to samples taken after fasting for at least 8 hours.   BUN 34 (H) 6 - 20 mg/dL   Creatinine, Ser 1.08 (H) 0.44 - 1.00 mg/dL   Calcium 8.8 (L) 8.9 - 10.3 mg/dL   Total Protein 6.2 (L) 6.5 - 8.1 g/dL   Albumin 2.9 (L) 3.5 - 5.0 g/dL   AST 22 15 - 41 U/L   ALT 10 0 - 44 U/L   Alkaline Phosphatase 61 38 - 126 U/L   Total Bilirubin 0.3 0.3 - 1.2 mg/dL   GFR, Estimated >60 >60 mL/min    Comment: (NOTE) Calculated using the CKD-EPI Creatinine Equation (2021)    Anion gap 7 5 - 15    Comment: Performed at [facility], [place] [address], [place], [place] [address]  Troponin I (High Sensitivity)     Status: Abnormal   Collection Time: 02/04/21  2:15 AM  Result Value Ref Range   Troponin I (High Sensitivity) 2,170 (HH) <18 ng/L    Comment: CRITICAL RESULT CALLED TO, READ BACK BY AND VERIFIED WITH: TADDEO J _0  BY BATTLET (NOTE) Elevated high sensitivity troponin I (hsTnI) values and significant  changes across serial measurements may suggest ACS but many other  chronic and acute conditions are known to elevate hsTnI results.  Refer to the Links section for chest pain algorithms and additional  guidance. Performed at [facility], [place] [address], [place], [place] [address]    CBC WITH DIFFERENTIAL     Status: Abnormal   Collection Time: 02/04/21  2:15 AM  Result Value Ref Range   WBC 5.3 4.0 - 10.5 K/uL   RBC 3.49 (L) 3.87 - 5.11 MIL/uL   Hemoglobin 9.9 (L) 12.0 - 15.0 g/dL   HCT 29.9 (L) 36.0 - 46.0 %   MCV 85.7 80.0 - 100.0 fL   MCH 28.4 26.0 - 34.0 pg   MCHC 33.1 30.0 - 36.0 g/dL   RDW 12.0 11.5 - 15.5 %   Platelets 146 (L) 150 - 400 K/uL   nRBC 0.0 0.0 - 0.2 %   Neutrophils Relative % 48 %   Neutro Abs 2.5 1.7 - 7.7 K/uL   Lymphocytes Relative 44 %   Lymphs Abs  2.3 0.7 - 4.0 K/uL   Monocytes Relative 6 %   Monocytes Absolute 0.3 0.1 - 1.0 K/uL   Eosinophils Relative 2 %   Eosinophils Absolute 0.1 0.0 - 0.5 K/uL   Basophils Relative 0 %   Basophils Absolute 0.0 0.0 - 0.1 K/uL   Immature Granulocytes 0 %   Abs Immature Granulocytes 0.01 0.00 - 0.07 K/uL    Comment: Performed at [facility], [place] [address] [place], [place] [address]  Vitamin B12     Status: None   Collection Time: 02/04/21  2:15 AM  Result Value Ref Range   Vitamin B-12 350 180 - 914 pg/mL    Comment: (NOTE) This assay is not validated for testing neonatal or myeloproliferative syndrome specimens for Vitamin B12 levels. Performed at [facility], [place] [address] [place], [place] [address]   Iron and TIBC     Status: Abnormal   Collection Time: 02/04/21  2:15 AM  Result Value Ref Range   Iron 36 28 - 170 ug/dL   TIBC 219 (L) 250 - 450 ug/dL   Saturation Ratios 16 10.4 - 31.8 %   UIBC 183 ug/dL    Comment: Performed at [facility], [place] [address], [place], [place] [address]  Ferritin     Status: None   Collection Time: 02/04/21  2:15 AM  Result Value Ref Range   Ferritin 49 11 - 307 ng/mL    Comment: Performed at [facility], [place] [address], [place], [place] [address]  Lipid panel     Status: Abnormal   Collection Time: 02/04/21  2:15 AM  Result Value Ref Range   Cholesterol 212 (H) 0 - 200  mg/dL   Triglycerides 81 <150 mg/dL   HDL 54 >40 mg/dL   Total CHOL/HDL Ratio 3.9 RATIO   VLDL 16 0 - 40 mg/dL   LDL Cholesterol 142 (H) 0 - 99 mg/dL    Comment:        Total Cholesterol/HDL:CHD Risk Coronary Heart Disease Risk Table                     Men   Women  1/2 Average Risk   3.4   3.3  Average Risk       5.0   4.4  2 X Average Risk   9.6   7.1  3 X Average Risk  23.4   11.0        Use the calculated Patient Ratio above and the CHD Risk Table to determine the patient's CHD Risk.        ATP III CLASSIFICATION (LDL):  <100     mg/dL   Optimal  100-129  mg/dL   Near or Above                    Optimal  130-159  mg/dL   Borderline  160-189  mg/dL   High  >190     mg/dL   Very High Performed at [place] [address], [place], [place] [address]   Glucose, capillary     Status: Abnormal   Collection Time: 02/04/21  4:11 AM  Result Value Ref Range   Glucose-Capillary 200 (H) 70 - 99 mg/dL    Comment: Glucose reference range applies only to samples taken after fasting for at least 8 hours.  Heparin level (unfractionated)     Status: Abnormal   Collection Time: 02/04/21  5:06 AM  Result Value Ref Range   Heparin Unfractionated >1.10 (H) 0.30 - 0.70 IU/mL    Comment: (NOTE) The clinical reportable range upper limit is being lowered to >1.10 to align with the FDA approved guidance for the current laboratory assay.  If heparin results are below expected values, and patient dosage has  been confirmed, suggest follow up testing of antithrombin III levels. Performed at [facility], [place] [address] [place], [place] [address]   Protime-INR     Status: None   Collection Time: 02/04/21  5:06 AM  Result Value Ref Range   Prothrombin Time 15.2 11.4 - 15.2 seconds   INR 1.2 0.8 - 1.2    Comment: (NOTE) INR goal varies based on device and disease states. Performed at [facility], [place] [address], [place], [place] [address]   APTT     Status: None   Collection Time: 02/04/21  5:06 AM  Result Value Ref Range   aPTT 29 24 - 36 seconds    Comment: Performed at [facility], [place] [address], [place], [place] [address]  Glucose, capillary     Status: Abnormal   Collection Time: 02/04/21  7:45 AM  Result Value Ref Range   Glucose-Capillary 65 (L) 70 - 99 mg/dL    Comment: Glucose reference range applies only to samples taken after fasting for at least 8 hours.  Troponin I (High Sensitivity)     Status: Abnormal   Collection Time: 02/04/21  8:30 AM  Result Value Ref Range   Troponin I (High Sensitivity) 2,176 (HH) <18 ng/L    Comment: CRITICAL VALUE NOTED.  VALUE IS CONSISTENT WITH PREVIOUSLY REPORTED AND CALLED VALUE. (NOTE) Elevated high sensitivity troponin I (hsTnI) values and significant  changes across serial measurements may suggest ACS but many other  chronic and acute conditions are known to elevate hsTnI results.  Refer to the Links section for chest pain algorithms and additional  guidance. Performed at [facility], [place] [address], [place], [place] [address]   Glucose, capillary     Status: Abnormal   Collection Time: 02/04/21  9:11 AM  Result Value Ref Range   Glucose-Capillary 114 (H) 70 - 99 mg/dL    Comment: Glucose reference range applies only to samples taken after fasting for at least 8 hours.   DG CHEST PORT 1 VIEW  Result Date: 02/03/2021 CLINICAL DATA:  Confusion, vomiting.  Recent UTI.  Hypertension. EXAM: PORTABLE CHEST 1 VIEW COMPARISON:  11/07/2020 FINDINGS: Heart and mediastinal contours are within normal limits. No focal opacities or effusions. No acute bony abnormality. IMPRESSION: No active disease. Electronically Signed   By: Rolm Baptise M.D.   On: 02/03/2021 20:34   CT Renal Stone Study  Result Date: 02/03/2021 CLINICAL DATA:  Flank pain, recent UTI EXAM: CT ABDOMEN AND PELVIS WITHOUT CONTRAST TECHNIQUE:  Multidetector CT imaging of the abdomen and pelvis was performed following the standard protocol without IV contrast. COMPARISON:  05/16/2020 FINDINGS: Lower chest: Lung bases are clear. Hepatobiliary: Unenhanced liver is unremarkable. Suspected gallbladder sludge versus noncalcified gallstones, without associated inflammatory changes. No intrahepatic or extrahepatic ductal dilatation. Pancreas: Within normal limits. Spleen: Within normal limits. Adrenals/Urinary Tract: Adrenal glands are within normal limits. Kidneys are within normal limits. No renal calculi or hydronephrosis. Bladder is within normal limits. Stomach/Bowel: Stomach is within normal limits. No evidence of bowel obstruction. Normal appendix (series 2/image 52). No colonic wall thickening or inflammatory changes. Vascular/Lymphatic: No  evidence of abdominal aortic aneurysm. Mild vascular calcifications. No suspicious abdominopelvic lymphadenopathy. Reproductive: Anterior position of the uterus suggests prior C-section. No adnexal masses. Other: No abdominopelvic ascites. Musculoskeletal: Visualized osseous structures are within normal limits. IMPRESSION: No renal, ureteral, or bladder calculi.  No hydronephrosis. Suspected gallbladder sludge versus noncalcified gallstones, without associated inflammatory changes. Electronically Signed   By: Julian Hy M.D.   On: 02/03/2021 19:18    Review Of Systems Constitutional: No fever, chills, weight loss or gain. Eyes: Left eye vision loss, wears glasses. No discharge or pain. Ears: No hearing loss, No tinnitus. Respiratory: No asthma, COPD, pneumonias. No shortness of breath. No hemoptysis. Cardiovascular: No chest pain, positive palpitation, no leg edema. Gastrointestinal: No nausea, vomiting, diarrhea, constipation. No GI bleed. No hepatitis. Genitourinary: No dysuria, hematuria, kidney stone. No incontinance. Neurological: Positive headache, no stroke, seizures.  Psychiatry: No psych  facility admission for anxiety, depression, suicide. No detox. Skin: No rash. Musculoskeletal: No joint pain, fibromyalgia. No neck pain, back pain. Lymphadenopathy: No lymphadenopathy. Hematology: No anemia or easy bruising.   Blood pressure (!) 184/77, pulse 79, temperature 98.3 F (36.8 C), temperature source Oral, resp. rate 12, height _0  (1.626 m), weight 72.6 kg, last menstrual period 08/21/2016, SpO2 99 %. Body mass index is 27.46 kg/m. General appearance: alert, cooperative, appears stated age and no distress Head: Normocephalic, atraumatic. Eyes: Brown eyes, pink conjunctiva, Left cornea hazy. Neck: No adenopathy, no carotid bruit, no JVD, supple, symmetrical, trachea midline and thyroid not enlarged. Resp: Clear to auscultation bilaterally. Cardio: Regular rate and rhythm, S1, S2 normal, II/VI systolic murmur, no click, rub or gallop GI: Soft, non-tender; bowel sounds normal; no organomegaly. Extremities: No edema, cyanosis or clubbing. Chronic right ankle mild swelling. Skin: Warm and dry.  Neurologic: Alert and oriented X 3, normal strength. Normal coordination.  Assessment/Plan Paroxysmal atrial fibrillation, CHA2DS2VASc score of 3 Small NSTEMI HTN Type 2 DM with hyperglycemia Hyperlipidemia Left eye glaucoma with vision loss Headache  Plan: Discussed need for cardiac catheterization with patient and daughter. Continue IV heparin. Awaiting echocardiogram.  Time spent: Review of old records, Lab, x-rays, EKG, other cardiac tests, examination, discussion with patient/Family over 70 minutes.  Birdie Riddle, MD  02/04/2021, 9:53 AM

## 2021-02-04 NOTE — Progress Notes (Signed)
Initial Nutrition Assessment RD working remotely.  DOCUMENTATION CODES:   Not applicable  INTERVENTION:  - diet advancement as medically feasible. - complete NFPE when feasible.    NUTRITION DIAGNOSIS:   Inadequate oral intake related to acute illness, nausea, vomiting, lethargy/confusion as evidenced by per patient/family report.  GOAL:   Patient will meet greater than or equal to 90% of their needs  MONITOR:   Diet advancement, Labs, Weight trends  REASON FOR ASSESSMENT:   Malnutrition Screening Tool, Consult Assessment of nutrition requirement/status  ASSESSMENT:   50 y.o. 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 Fib on Eliquis, anemia, wheelchair bound since a fall in 04/2020, HTN, and HLD. She presented to the ED due to confusion, N/V, and not tolerating home meds. She was seen in the ED 5 days prior and dx with UTI.  RD is working remotely. Patient is noted to need an Arabic interpreter.   Carb Modified diet ordered yesterday at 2130 and then changed to NPO today at 0013.   She was seen several times by outpatient RD in April and May and was seen by an inpatient [facility] RD x3 in August. At that time, she did not meet criteria for malnutrition. Ongoing DM diet education between inpatient and outpatient RD visits/assessments.  Weight yesterday was documented as 160 lb which appears to be a stated weight. Weight has been stable (152-160 lb) since May.    Labs reviewed; CBGs: 65-318 mg/dl, BUN: 34 mg/dl, creatinine: 1.08 mg/dl. Medications reviewed; sliding scale novolog, 7 units levemir/day. IVF; NS @ 100 ml/hr.     NUTRITION - FOCUSED PHYSICAL EXAM:  Unable to complete at this time.  Diet Order:   Diet Order             Diet NPO time specified Except for: Sips with Meds  Diet effective now                   EDUCATION NEEDS:   Not appropriate for  education at this time  Skin:  Skin Assessment: Reviewed RN Assessment  Last BM:  PTA/unknown  Height:   Ht Readings from Last 1 Encounters:  02/03/21 5\' 4"  (1.626 m)    Weight:   Wt Readings from Last 1 Encounters:  02/03/21 72.6 kg    Estimated Nutritional Needs:  Kcal:  2180-2300 kcal Protein:  110-120 grams Fluid:  >/= 2.2 L/day      Jarome Matin, MS, RD, LDN, CNSC Inpatient Clinical Dietitian RD pager # available in [place]  After hours/weekend pager # available in [facility]

## 2021-02-04 NOTE — Progress Notes (Signed)
  Patient received from ED.   02/03/21 2127  Vitals  Temp 98.5 F (36.9 C)  Temp Source Oral  BP (!) 148/70  MAP (mmHg) 93  BP Location Right Arm  BP Method Automatic  Patient Position (if appropriate) Lying  Pulse Rate 72  Pulse Rate Source Monitor  Resp 14  MEWS COLOR  MEWS Score Color Green  Oxygen Therapy  SpO2 100 %  O2 Device Room Air

## 2021-02-04 NOTE — Progress Notes (Signed)
PROGRESS NOTE    Jennifer [name]  WGN:[id number] DOB: 07-Jun-1970 DOA: 02/03/2021 PCP: Jose Persia, MD    Chief Complaint  Patient presents with   Vomiting    Brief Narrative:  Patient is a 50 year old female complicated history with history of insulin-dependent diabetes mellitus, autonomic dysfunction, left eye blindness secondary to glaucoma, A. fib on chronic anticoagulation with Eliquis, anemia, right ankle fracture, hyperlipidemia, hypertension, who presented to the ED with confusion, nausea and vomiting, inability to keep oral medications down.  It was noted that patient seen in the ED 5 days prior to admission diagnosed with a UTI started on Keflex which daughter states did not complete a full course of treatment.  Head CT done was negative at that time.  Due to patient's confusion family concerned about DKA and brought patient to the ED.  Patient noted to be wheelchair-bound since a fall in January that resulted in a right ankle fracture.  Patient seen in the ED and admitted.  Patient noted to be in A. fib with RVR on presentation in the ED and subsequently converted back to normal sinus rhythm.  Patient initially noted to be hypertensive with improvement with blood pressure.  Work-up done concerning for non-STEMI as well as concerns for hypertensive urgency.  Patient admitted, cardiology consulted.  Patient seen in consultation by cardiology, Dr. Doylene Canard who had recommended a cardiac catheterization.  Also concern for 5.38-second pause noted on telemetry per RN after receiving a dose of IV Lopressor.   Assessment & Plan:   Principal Problem:   NSTEMI (non-ST elevated myocardial infarction) ([place]) Active Problems:   Type II diabetes mellitus ([place])   Hyperlipidemia   (HFpEF) heart failure with preserved ejection fraction (HCC)   Migraine   Blind left eye   CKD (chronic kidney disease) stage 3, GFR 30-59 ml/min (HCC)   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 (HCC)    Atrial fibrillation with RVR (HCC)   Hyperglycemia   Hypertensive urgency   Elevated troponin  #1 non-STEMI -Patient presenting to the ED with confusion, nausea, vomiting, headache noted to have some hypertensive urgency.  Patient also noted to have A. fib with RVR transiently which subsequently converted to normal sinus rhythm.  Patient noted to also have a history of medication noncompliance. -EKG done with no significant ischemic changes noted.  Chest x-ray unremarkable. -Cardiac enzymes with troponin of 1611>>> 2121>>>>> 2170>>> 2176. -2D echo ordered and pending. -LDL 142. -We will place patient on low-dose ARB. -Patient started on a statin. -Patient received a dose of IV Lopressor however patient noted to have a 5.38-second pause and as such beta-blocker discontinued. -Patient started on heparin drip. -Patient seen in consultation by cardiology who are recommending cardiac catheterization. -We will transfer patient to [facility] and will be transferred to IMTS (PCP) service as patient scheduled for cardiac catheterization tomorrow and further evaluation and management per cardiology. -Clear liquids, n.p.o. after midnight in anticipation of cardiac catheterization tomorrow. -Cardiology following and appreciate input and recommendations.  2.  Sinus pause -Patient noted on telemetry to have a 5.38-second sinus pause after receiving a dose of IV Lopressor. -Patient noted to have elevated cardiac enzymes. -2D echo pending. -Discontinue IV Lopressor. -Discussed with cardiology who are following.  3.  A. fib with RVR -Patient noted to transiently go into A. fib with RVR on presentation and subsequently converted back to normal sinus rhythm. -Patient noted not to be on any rate controlling medications. -Patient did have a sinus pause of 5.38 seconds  and had received a dose of IV Lopressor at that time. -Hold off on AV nodal blocking agents. -On heparin drip. -Cardiology  following.  4.  Autonomic dysfunction with type 2 diabetes (poorly controlled) -Patient noted to have labile blood pressure. -Per family patient had been placed on medication to help keep her blood pressure elevated however unsure of what the medication is. -Hemoglobin A1c 12.1 (02/03/2021) -CBG 114 this morning. -Place on half home dose Levemir at 7 units daily. -SSI. -Consult with diabetic coordinator.  5.  Recently diagnosed UTI -Patient noted to have been diagnosed with a UTI on presentation to the ED 5 days prior to admission.  Urine cultures from that visit with multiple species present. -Patient noted to have not completed a full course of antibiotic treatment per daughter at bedside -Urinalysis done on presentation with glycosuria, small leukocytes, nitrite negative, budding yeast present, no bacteriuria, 21-50 WBCs. -Continue IV Rocephin pending urine culture results. -IV Diflucan x7 days.  6.  Nausea/vomiting -Likely secondary to problem #1 and possibly UTI. -Clear liquids. -IV antiemetics, supportive care.  7.  Hypertensive urgency -Patient noted to have labile blood pressure.  Patient also noted to have some transient headache in the setting of hypertension. -No focal neurological deficits. -Per family patient had to be placed on medication due to periods of hypotension however not sure of the name of the medication. -Systolic blood pressures in the 180s. -Placed on low-dose Norvasc 2.5 mg twice daily, losartan 25 mg daily, IV hydralazine as needed systolic blood pressure > 180. -Discussed with cardiology. -If develop bouts of hypotension may consider starting patient on midodrine.  8.  Debility -PT/OT.  9.  Hyperlipidemia -Fasting lipid panel with LDL of 142. -Statin.  10.  Migraine headache -IV Compazine x1.  Patient noted already to have received some Norco/Vicodin. -May consider as needed Compazine in addition to possible IV Toradol as needed for  headache.  11 glaucoma -Continue home regimen eyedrops. -   DVT prophylaxis: Heparin Code Status: Full Family Communication: Updated patient, daughter at bedside, son on the telephone. Disposition:   Status is: Observation  The patient remains OBS appropriate and will d/c before 2 midnights.      Consultants:  Cardiology: Dr. Doylene Canard 02/04/2021.  Procedures:  2D echo pending 02/04/2021 CT renal stone protocol 02/03/2021 Chest x-ray 02/03/2021    Antimicrobials:  IV Rocephin 02/03/2021   Subjective: Patient sitting in bed with bedsheet overhead complaining of headache.  Denies any significant shortness of breath.  Denies any significant chest pain.  Complaining of nausea.  Noted to have a small bout of emesis earlier on per RN.  Daughter at bedside.  Per daughter patient recently diagnosed with UTI 5 days prior to admission was prescribed IV antibiotics however did not complete full course of medication.   Objective: Vitals:   02/04/21 0058 02/04/21 0516 02/04/21 0933 02/04/21 1219  BP: (!) 143/63 (!) 116/58 (!) 184/77 (!) 185/83  Pulse: 75 69 79 88  Resp: 20 18 12 18   Temp: 98 F (36.7 C) 98.4 F (36.9 C) 98.3 F (36.8 C) 97.8 F (36.6 C)  TempSrc: Oral Oral Oral Oral  SpO2: 100% 98% 99% 100%  Weight:      Height:        Intake/Output Summary (Last 24 hours) at 02/04/2021 1253 Last data filed at 02/04/2021 0517 Gross per 24 hour  Intake 2101.9 ml  Output --  Net 2101.9 ml   Filed Weights   02/03/21 2223  Weight: 72.6  kg    Examination:  General exam: Appears calm and comfortable  Respiratory system: Clear to auscultation. Respiratory effort normal. Cardiovascular system: S1 & S2 heard, RRR. No JVD, murmurs, rubs, gallops or clicks. No pedal edema. Gastrointestinal system: Abdomen is nondistended, soft and nontender. No organomegaly or masses felt. Normal bowel sounds heard. Central nervous system: Alert and oriented. No focal neurological  deficits. Extremities: Symmetric 5 x 5 power. Skin: No rashes, lesions or ulcers Psychiatry: Judgement and insight appear normal. Mood & affect appropriate.     Data Reviewed: I have personally reviewed following labs and imaging studies  CBC: Recent Labs  Lab 01/29/21 2305 02/03/21 1547 02/04/21 0215  WBC 7.9 5.1 5.3  NEUTROABS 3.0 3.6 2.5  HGB 11.8* 12.7 9.9*  HCT 36.1 39.6 29.9*  MCV 86.0 86.8 85.7  PLT 179 163 146*    Basic Metabolic Panel: Recent Labs  Lab 01/29/21 2305 02/03/21 1547 02/03/21 2212 02/04/21 0215  NA 138 134* 137 138  K 3.7 5.1 4.0 3.6  CL 104 103 104 106  CO2 27 19* 22 25  GLUCOSE 96 336* 313* 344*  BUN 29* 28* 34* 34*  CREATININE 1.28* 1.00 1.14* 1.08*  CALCIUM 9.6 9.3 8.9 8.8*  MG  --   --  1.9 1.9  PHOS  --   --  4.9* 3.9    GFR: Estimated Creatinine Clearance: 61.6 mL/min (A) (by C-G formula based on SCr of 1.08 mg/dL (H)).  Liver Function Tests: Recent Labs  Lab 01/29/21 2305 02/03/21 1547 02/04/21 0215  AST 16 28 22   ALT 12 13 10   ALKPHOS 73 81 61  BILITOT 0.5 1.3* 0.3  PROT 6.9 7.8 6.2*  ALBUMIN 3.1* 3.5 2.9*    CBG: Recent Labs  Lab 02/04/21 0042 02/04/21 0411 02/04/21 0745 02/04/21 0911 02/04/21 1215  GLUCAP 318* 200* 65* 114* 195*     Recent Results (from the past 240 hour(s))  Urine Culture     Status: Abnormal   Collection Time: 01/30/21  2:15 AM   Specimen: Urine, Clean Catch  Result Value Ref Range Status   Specimen Description URINE, CLEAN CATCH  Final   Special Requests   Final    NONE Performed at [facility], [place] [address] [place], [place] [address]    Culture MULTIPLE SPECIES PRESENT, SUGGEST RECOLLECTION (A)  Final   Report Status 01/30/2021 FINAL  Final  Resp Panel by RT-PCR (Flu A&B, Covid) Nasopharyngeal Swab     Status: None   Collection Time: 02/03/21  1:40 PM   Specimen: Nasopharyngeal Swab; Nasopharyngeal(NP) swabs in vial transport medium  Result Value Ref Range Status    SARS Coronavirus 2 by RT PCR NEGATIVE NEGATIVE Final    Comment: (NOTE) SARS-CoV-2 target nucleic acids are NOT DETECTED.  The SARS-CoV-2 RNA is generally detectable in upper respiratory specimens during the acute phase of infection. The lowest concentration of SARS-CoV-2 viral copies this assay can detect is 138 copies/mL. A negative result does not preclude SARS-Cov-2 infection and should not be used as the sole basis for treatment or other patient management decisions. A negative result may occur with  improper specimen collection/handling, submission of specimen other than nasopharyngeal swab, presence of viral mutation(s) within the areas targeted by this assay, and inadequate number of viral copies(<138 copies/mL). A negative result must be combined with clinical observations, patient history, and epidemiological information. The expected result is Negative.  Fact Sheet for Patients:  EntrepreneurPulse.com.au  Fact Sheet for Healthcare Providers:  IncredibleEmployment.be  This test is no t yet approved or cleared by the [place] and  has been authorized for detection and/or diagnosis of SARS-CoV-2 by FDA under an Emergency Use Authorization (EUA). This EUA will remain  in effect (meaning this test can be used) for the duration of the COVID-19 declaration under Section 564(b)(1) of the Act, 21 U.S.C.section 360bbb-3(b)(1), unless the authorization is terminated  or revoked sooner.       Influenza A by PCR NEGATIVE NEGATIVE Final   Influenza B by PCR NEGATIVE NEGATIVE Final    Comment: (NOTE) The Xpert Xpress SARS-CoV-2/FLU/RSV plus assay is intended as an aid in the diagnosis of influenza from Nasopharyngeal swab specimens and should not be used as a sole basis for treatment. Nasal washings and aspirates are unacceptable for Xpert Xpress SARS-CoV-2/FLU/RSV testing.  Fact Sheet for  Patients: EntrepreneurPulse.com.au  Fact Sheet for Healthcare Providers: IncredibleEmployment.be  This test is not yet approved or cleared by the [place] FDA and has been authorized for detection and/or diagnosis of SARS-CoV-2 by FDA under an Emergency Use Authorization (EUA). This EUA will remain in effect (meaning this test can be used) for the duration of the COVID-19 declaration under Section 564(b)(1) of the Act, 21 U.S.C. section 360bbb-3(b)(1), unless the authorization is terminated or revoked.  Performed at [facility], [place] [address] [place], [place] [address]          Radiology Studies: DG CHEST PORT 1 VIEW  Result Date: 02/03/2021 CLINICAL DATA:  Confusion, vomiting.  Recent UTI.  Hypertension. EXAM: PORTABLE CHEST 1 VIEW COMPARISON:  11/07/2020 FINDINGS: Heart and mediastinal contours are within normal limits. No focal opacities or effusions. No acute bony abnormality. IMPRESSION: No active disease. Electronically Signed   By: Rolm Baptise M.D.   On: 02/03/2021 20:34   CT Renal Stone Study  Result Date: 02/03/2021 CLINICAL DATA:  Flank pain, recent UTI EXAM: CT ABDOMEN AND PELVIS WITHOUT CONTRAST TECHNIQUE: Multidetector CT imaging of the abdomen and pelvis was performed following the standard protocol without IV contrast. COMPARISON:  05/16/2020 FINDINGS: Lower chest: Lung bases are clear. Hepatobiliary: Unenhanced liver is unremarkable. Suspected gallbladder sludge versus noncalcified gallstones, without associated inflammatory changes. No intrahepatic or extrahepatic ductal dilatation. Pancreas: Within normal limits. Spleen: Within normal limits. Adrenals/Urinary Tract: Adrenal glands are within normal limits. Kidneys are within normal limits. No renal calculi or hydronephrosis. Bladder is within normal limits. Stomach/Bowel: Stomach is within normal limits. No evidence of bowel obstruction. Normal appendix  (series 2/image 52). No colonic wall thickening or inflammatory changes. Vascular/Lymphatic: No evidence of abdominal aortic aneurysm. Mild vascular calcifications. No suspicious abdominopelvic lymphadenopathy. Reproductive: Anterior position of the uterus suggests prior C-section. No adnexal masses. Other: No abdominopelvic ascites. Musculoskeletal: Visualized osseous structures are within normal limits. IMPRESSION: No renal, ureteral, or bladder calculi.  No hydronephrosis. Suspected gallbladder sludge versus noncalcified gallstones, without associated inflammatory changes. Electronically Signed   By: Julian Hy M.D.   On: 02/03/2021 19:18        Scheduled Meds:  amLODipine  2.5 mg Oral BID   atorvastatin  40 mg Oral Daily   brimonidine  1 drop Left Eye Q8H   dorzolamide-timolol  1 drop Left Eye BID   feeding supplement  237 mL Oral BID BM   insulin aspart  0-9 Units Subcutaneous Q4H   insulin detemir  7 Units Subcutaneous QHS   losartan  25 mg Oral Daily   Continuous Infusions:  sodium chloride 100 mL/hr at  02/04/21 0817   cefTRIAXone (ROCEPHIN)  IV Stopped (02/03/21 1930)   heparin 900 Units/hr (02/04/21 1249)     LOS: 0 days    Time spent: 40 mins    Irine Seal, MD [facility]   To contact the attending provider between 7A-7P or the covering provider during after hours 7P-7A, please log into the web site [email or web address] and access using universal [place] password for that web site. If you do not have the password, please call the hospital operator.  02/04/2021, 12:53 PM

## 2021-02-04 NOTE — Progress Notes (Signed)
   02/04/21 0933  Vitals  Temp 98.3 F (36.8 C)  Temp Source Oral  BP (!) 184/77  MAP (mmHg) 108  BP Location Left Arm  BP Method Automatic  Patient Position (if appropriate) Lying  Pulse Rate 79  Pulse Rate Source Dinamap  Resp 12  Level of Consciousness  Level of Consciousness Responds to Voice  MEWS COLOR  MEWS Score Color Yellow  Oxygen Therapy  SpO2 99 %  O2 Device Room Air  MEWS Score  MEWS Temp 0  MEWS Systolic 0  MEWS Pulse 0  MEWS RR 1  MEWS LOC 1  MEWS Score 2

## 2021-02-04 NOTE — Progress Notes (Signed)
Hypoglycemic Event  CBG: 65  Treatment: D50 25 mL (12.5 gm)  Symptoms: None  Follow-up CBG: [id number] CBG Result:114  Possible Reasons for Event: Inadequate meal intake  Comments/MD notified:hypoglycemic protocol    Jennifer [name]

## 2021-02-04 NOTE — Progress Notes (Signed)
  Echocardiogram 2D Echocardiogram has been performed.  Merrie Roof F 02/04/2021, 12:00 PM

## 2021-02-04 NOTE — Progress Notes (Signed)
ANTICOAGULATION CONSULT NOTE - Initial Consult  Pharmacy Consult for Heparin Indication: chest pain/ACS  No Known Allergies  Patient Measurements: Height: '5\' 4"'  (162.6 cm) Weight: 72.6 kg (160 lb) IBW/kg (Calculated) : 54.7 HEPARIN DW (KG): 69.6   Vital Signs: Temp: 98 F (36.7 C) (10/16 0058) Temp Source: Oral (10/16 0058) BP: 143/63 (10/16 0058) Pulse Rate: 75 (10/16 0058)  Labs: Recent Labs    02/03/21 1547 02/03/21 2212 02/04/21 0013  HGB 12.7  --   --   HCT 39.6  --   --   PLT 163  --   --   CREATININE 1.00 1.14*  --   CKTOTAL  --  116  --   TROPONINIHS  --  1,611* 2,121*    Estimated Creatinine Clearance: 58.3 mL/min (A) (by C-G formula based on SCr of 1.14 mg/dL (H)).   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  She has not seen a dentist in about one year.  Dentist is on [address].  No fevers chills or systemic symptoms.  Diabetes has been poorly controlled for some time.  She said it has improved recently averaging around 140.  Called the dentist said they would not see patients until May 14th but the urgency o   Pap smear abnormality of cervix with LGSIL    Routine/ritual circumcision    Type II  diabetes mellitus ([place])    Uveitis     Medications:  Medications Prior to Admission  Medication Sig Dispense Refill Last Dose   apixaban (ELIQUIS) 5 MG TABS tablet Take 1 tablet (5 mg total) by mouth 2 (two) times daily. 180 tablet 0 02/02/2021 at 2400   atorvastatin (LIPITOR) 40 MG tablet Take 1 tablet (40 mg total) by mouth daily. 90 tablet 3 02/02/2021   brimonidine (ALPHAGAN) 0.2 % ophthalmic solution Place 1 drop into the left eye every 8 (eight) hours.   02/02/2021   calcium citrate-vitamin D (CITRACAL+D) 315-200 MG-UNIT tablet Take 2 tablets by mouth 2 (two) times daily. 360 tablet 3 Past Week   cephALEXin (KEFLEX) 500 MG capsule Take 1 capsule (500 mg total) by mouth 4 (four) times daily for 5 days. 20 capsule 0 02/02/2021   diclofenac Sodium (VOLTAREN) 1 % GEL Apply 2 g topically 4 (four) times daily as needed (foot pain).   Past Week   dorzolamide-timolol (COSOPT) 22.3-6.8 MG/ML ophthalmic solution Place 1 drop into the left eye 2 (two) times daily.   Past Week   gabapentin (NEURONTIN) 300 MG capsule Take 1 capsule (300 mg total) by mouth 3 (three) times daily. 270 capsule 3 02/02/2021   insulin aspart (NOVOLOG) 100 UNIT/ML FlexPen Inject 3 Units into the skin  3 (three) times daily with meals. 80 mL 0 02/02/2021   insulin detemir (LEVEMIR) 100 UNIT/ML FlexPen Inject 15 Units into the skin at bedtime 15 mL 0 02/02/2021   linagliptin (TRADJENTA) 5 MG TABS tablet Take 1 tablet (5 mg total) by mouth daily. 90 tablet 0 Past Week   metFORMIN (GLUCOPHAGE-XR) 500 MG 24 hr tablet TAKE 2 TABLETS (1,000 MG TOTAL) BY MOUTH IN THE MORNING AND AT BEDTIME. 360 tablet 1 Past Week   Accu-Chek FastClix Lancets MISC Check blood sugar 4 times a day 200 each 7    ACCU-CHEK GUIDE test strip CHECK BLOOD SUGAR 4 TIMES PER DAY 150 strip 12    Blood Glucose Monitoring Suppl (ACCU-CHEK GUIDE) w/Device KIT 1 each by Does not apply route 4 (four) times daily. 1 kit 1    Insulin Pen Needle (B-D UF III MINI PEN  NEEDLES) 31G X 5 MM MISC Use pen needle daily for injections 100 each 2    Insulin Pen Needle (PENTIPS) 32G X 4 MM MISC Use as directed with insulin pen 100 each 3    metoCLOPramide (REGLAN) 5 MG tablet Take 1 tablet (5 mg total) by mouth 4 (four) times daily -  before meals and at bedtime. 120 tablet 0    Infusions:   sodium chloride 100 mL/hr at 02/03/21 2213   cefTRIAXone (ROCEPHIN)  IV Stopped (02/03/21 1930)    Assessment: 78 yoF on apixaban for Afib.  Admitted with elevated troponin and symptomatic Afib. Pharmacy consulted to start heparin. Last dose apixaban: 10/15 @ 2215. Baseline labs: CBC WNL.  Scr mildly elevated at 1.14    Baseline coags ordered- anticipate heparin level will be elevated due to apixaban use and will therefore monitor heparin using aptt levels until apixaban eliminated/heparin levels correlate with aptt levels.   Goal of Therapy:  Heparin level 0.3-0.7 units/ml aPTT 66-102 seconds Monitor platelets by anticoagulation protocol: Yes   Plan:  At 10a, begin heparin infusion at 900 units/hr Monitor using aptt until level correlates with heparin level.  Check aPtt at 1800. Daily heparin level, CBC while on heparin F/U cardiology recommendations for intervention/heparin duration F/U to resume apixaban once clinically appropriate  Netta Cedars PharmD 02/04/2021,2:48 AM

## 2021-02-04 NOTE — Progress Notes (Signed)
Inpatient Diabetes Program Recommendations  AACE/ADA: New Consensus Statement on Inpatient Glycemic Control (2015)  Target Ranges:  Prepandial:   less than 140 mg/dL      Peak postprandial:   less than 180 mg/dL (1-2 hours)      Critically ill patients:  140 - 180 mg/dL   Lab Results  Component Value Date   GLUCAP 195 (H) 02/04/2021   HGBA1C 12.1 (H) 02/03/2021    Review of Glycemic Control Results for Jennifer, [name] (MRN [id number]) as of 02/04/2021 14:10  Ref. Range 02/03/2021 22:09 02/04/2021 00:42 02/04/2021 04:11 02/04/2021 07:45 02/04/2021 09:11 02/04/2021 12:15  Glucose-Capillary Latest Ref Range: 70 - 99 mg/dL 278 (H) 318 (H) 200 (H) 65 (L) 114 (H) 195 (H)   Diabetes history: DM 2 Outpatient Diabetes medications:  Novolog 3 units tid with meals , Levemir 15 units q HS, Tradjenta 5 mg daily, Metformin 1000 mg bid Current orders for Inpatient glycemic control:  Novolog sensitive q 4 hours, Levemir 7 units q HS Inpatient Diabetes Program Recommendations:   Please consider reducing Novolog correction to "very Sensitive" q 4 hours.  Will follow up with patient on 02/05/21 regarding DM management.  Thanks,  Adah Perl, RN, BC-ADM Inpatient Diabetes Coordinator Pager [phone]

## 2021-02-04 NOTE — Progress Notes (Signed)
   02/04/21 0933  Assess: MEWS Score  Temp 98.3 F (36.8 C)  BP (!) 184/77  Pulse Rate 79  Resp 12  Level of Consciousness Responds to Voice  SpO2 99 %  O2 Device Room Air  Assess: MEWS Score  MEWS Temp 0  MEWS Systolic 0  MEWS Pulse 0  MEWS RR 1  MEWS LOC 1  MEWS Score 2  MEWS Score Color Yellow  Assess: if the MEWS score is Yellow or Red  Were vital signs taken at a resting state? Yes  Focused Assessment Change from prior assessment (see assessment flowsheet)  Does the patient meet 2 or more of the SIRS criteria? No  MEWS guidelines implemented *See Row Information* Yes  Treat  MEWS Interventions Administered prn meds/treatments;Escalated (See documentation below)  Take Vital Signs  Increase Vital Sign Frequency  Yellow: Q 2hr X 2 then Q 4hr X 2, if remains yellow, continue Q 4hrs  Escalate  MEWS: Escalate Yellow: discuss with charge nurse/RN and consider discussing with provider and RRT  Notify: Charge Nurse/RN  Name of Charge Nurse/RN Notified Ardeen Garland RN  Date Charge Nurse/RN Notified 02/04/21  Time Charge Nurse/RN Notified [id number]  Notify: Provider  Provider Name/Title Dr Grandville Silos  Date Provider Notified 02/04/21  Time Provider Notified [phone]  Notification Type  (secure chat)  Notification Reason Change in status  Provider response See new orders  Date of Provider Response 02/04/21  Time of Provider Response 0946  Assess: SIRS CRITERIA  SIRS Temperature  0  SIRS Pulse 0  SIRS Respirations  0  SIRS WBC 0  SIRS Score Sum  0

## 2021-02-04 NOTE — Progress Notes (Signed)
PT Cancellation Note  Patient Details Name: Jennifer [name] MRN: [id number] DOB: Sep 13, 1970   Cancelled Treatment:    Reason Eval/Treat Not Completed: Medical issues which prohibited therapy. Heparin started at 10:00 today. Will continue efforts to complete PT eval once pt  in therapeutic range and/or outside 24 hour window.   [facility] 02/04/2021, 1:51 PM

## 2021-02-04 NOTE — Progress Notes (Addendum)
Pt with troponin greater than 1600. According to bedside RN, pt is currently asymptomatic. EKG is NSR. BP stable. Directed by Dr Roel Cluck to consult CARDS due to hx of CAD and symptoms presented on admission. CARDS to see pt tomorrow. Advised that this is most likely having demand ischemia and to keep pt on eliquis for now. Initiate heparin gtt if pt becomes symptomatic. Continue to treat hypertension as needed.   Lovey Newcomer, NP [facility] 7p-7a [phone]

## 2021-02-04 NOTE — Progress Notes (Signed)
OT Cancellation Note  Patient Details Name: Jennifer [name] MRN: [id number] DOB: [birth year]   Cancelled Treatment:    Reason Eval/Treat Not Completed: Medical issues which prohibited therapy patient started Heparin drip at 10 am on this date. Therapy is not indicated within 24 hours of starting Heparin. OT will check back tomorrow when out of 24 hour window.   Jackelyn Poling OTR/L, [facility] Office# [phone] Pager# [phone]    02/04/2021, 1:06 PM

## 2021-02-04 NOTE — H&P (Addendum)
Date: 02/04/2021               Patient Name:  Jennifer [name] MRN: [id number]  DOB: 09/12/1970 Age / Sex: 50 y.o., female   PCP: Jose Persia, MD         Medical Service: Internal Medicine Teaching Service         Attending Physician: Dr. Velna Ochs, MD    First Contact: Dr. Mitzie Na Pager: [phone]  Second Contact: Dr. Iona Beard Pager: [phone]       After Hours (After 5p/  First Contact Pager: [phone]  weekends / holidays): Second Contact Pager: [phone]   Chief Complaint: confusion, nausea, poor oral intake  History of Present Illness: Aleyna Cueva is a 50 yo female with HFpEF, insulin dependent type 2 diabetes, A Fib on eliquis, hyperlipidemia, blindness of her left eye 2/2 glaucoma, and CKD stage 3 who presented as a transfer from [facility] for a cardiac catheterization scheduled for 10/17. The patient initially presented to [facility] on 10/15 with confusion, nausea, and poor oral intake. Per the patient and family, she has been nauseous and vomiting since 10/11. She was seen in the ED at the time and was diagnosed with a UTI started on Keflex, however, she has not completed its course. The patient was also noted to be dehydrated at the time and clinically improved with IV fluids and was discharged to home. The patient's daughter states that after she got home, she was feeling better but then started having further episodes of confusion, nausea, and vomiting and she called EMS. Her BP has also recently been elevated at home, although it typically runs low. Her family was worried that she may have been in DKA, as she has had this in the past, and thus brought her to the hospital for evaluation. She has not been taking her home medications regularly because of her nausea and vomiting.  The daughter also notes that she has to often remind her mother to take her medications, and she is unsure how often she forgets her doses, as someone is not home with her  24/7 to monitor this. The patient denies any fevers, chills, headache, chest pain, shortness of breath, abd pain, diarrhea, melena, hematochezia, dysuria, hematuria, flank pain, or any other sxs at this time  At [facility], the patient was initially noted to be in Afib with RVR which converted to normal sinus rhythm. She was found to have a troponin >1600 and then it peaked at 2170. Cardiology was consulted for NSTEMI and scheduled the patient for cardiac catheterization on 10/17.    Meds:  Eliquis 5 mg BID Lipitor 40 mg Brimonidine eye drops Keflex 500 mg QID (started 10/11) Gabapentin 300 mg TID Novolog 3u TID with meals Levemir 15u qhs Tradjenta 5 mg daily  Metformin 1000 mg BID Reglan 5 mg QID Current Meds  Medication Sig   apixaban (ELIQUIS) 5 MG TABS tablet Take 1 tablet (5 mg total) by mouth 2 (two) times daily.   atorvastatin (LIPITOR) 40 MG tablet Take 1 tablet (40 mg total) by mouth daily.   brimonidine (ALPHAGAN) 0.2 % ophthalmic solution Place 1 drop into the left eye every 8 (eight) hours.   calcium citrate-vitamin D (CITRACAL+D) 315-200 MG-UNIT tablet Take 2 tablets by mouth 2 (two) times daily.   cephALEXin (KEFLEX) 500 MG capsule Take 1 capsule (500 mg total) by mouth 4 (four) times daily for 5 days.   diclofenac Sodium (VOLTAREN) 1 %  GEL Apply 2 g topically 4 (four) times daily as needed (foot pain).   dorzolamide-timolol (COSOPT) 22.3-6.8 MG/ML ophthalmic solution Place 1 drop into the left eye 2 (two) times daily.   gabapentin (NEURONTIN) 300 MG capsule Take 1 capsule (300 mg total) by mouth 3 (three) times daily.   insulin aspart (NOVOLOG) 100 UNIT/ML FlexPen Inject 3 Units into the skin 3 (three) times daily with meals.   insulin detemir (LEVEMIR) 100 UNIT/ML FlexPen Inject 15 Units into the skin at bedtime   linagliptin (TRADJENTA) 5 MG TABS tablet Take 1 tablet (5 mg total) by mouth daily.   metFORMIN (GLUCOPHAGE-XR) 500 MG 24 hr tablet TAKE 2 TABLETS (1,000 MG  TOTAL) BY MOUTH IN THE MORNING AND AT BEDTIME.     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  She has not seen a dentist in about one year.  Dentist is on [address].  No fevers chills or systemic symptoms.  Diabetes has been poorly controlled for some time.  She said it has improved recently averaging around 140.  Called the dentist said they would not see patients until May 14th but the urgency o   Pap smear abnormality of cervix with LGSIL    Routine/ritual circumcision    Type II diabetes mellitus ([place])    Uveitis     Family History: diabetes (father)  Social History: Lives at home with her family, able to carry out some ADLs (children cook for her and help get her into shower). Has been wheelchair bound since an ankle fracture in January, occasionally uses walker to ambulate in home. No tobacco or alcohol use.  Patient's family would not like any local translators to be involved in her care team, requesting iPad translation services ([place] dialect of Arabic).   Review of Systems: A complete ROS was negative except as per HPI.   Physical Exam: Blood pressure 140/66, pulse  (!) 59, temperature 99.2 F (37.3 C), temperature source Oral, resp. rate 16, height 5\' 6"  (1.676 m), weight 72.2 kg, last menstrual period 08/21/2016, SpO2 100 %. Physical Exam Constitutional:      General: She is not in acute distress.    Appearance: Normal appearance.  HENT:     Head: Normocephalic and atraumatic.  Eyes:     Pupils: Pupils are equal, round, and reactive to light.  Cardiovascular:     Rate and Rhythm: Normal rate and regular rhythm.     Pulses: Normal pulses.     Heart sounds: No murmur heard. Pulmonary:     Effort: Pulmonary effort is normal. No respiratory distress.     Breath sounds:  Normal breath sounds. No wheezing, rhonchi or rales.  Abdominal:     General: Bowel sounds are normal. There is no distension.     Palpations: Abdomen is soft.     Tenderness: There is no abdominal tenderness.  Musculoskeletal:     Right lower leg: No edema.     Left lower leg: No edema.     Comments: S/p ORIF R ankle  Neurological:     General: No focal deficit present.     Mental Status: She is alert and oriented to person, place, and time. Mental status is at baseline.  Psychiatric:        Mood and Affect: Mood normal.        Behavior: Behavior normal.     EKG: personally reviewed my interpretation is 97 bpm with nonspecific ST changes.  CXR: personally reviewed my interpretation is no acute abnormalities  Assessment & Plan by Problem: Principal Problem:   NSTEMI (non-ST elevated myocardial infarction) (HCC) Active Problems:   Type II diabetes mellitus (HCC)   Hyperlipidemia   (HFpEF) heart failure with preserved ejection fraction (HCC)   Migraine   Blind left eye   Urinary tract infection with hematuria   CKD (chronic kidney disease) stage 3, GFR 30-59 ml/min (HCC)   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 (HCC)   Atrial fibrillation with RVR (HCC)   Hyperglycemia   Hypertensive urgency   Elevated troponin  #NSTEMI #HFpEF Patient presented  with confusion, nausea, and vomiting. Initially was found to be hypertensive, with BP up to 184/77. The patient denies any headache, chest pain, back pain, jaw pain, shortness of breath, or new numbness/tingling (has chronic neuropathy from diabetes). Troponins elevated to 1600 and peaked at 2170. EKG performed which showed no ischemic changes and her CXR was also unremarkable. Echo performed today with EF of 60-65% and grade I diastolic dysfunction, no significant changes noted when compared to prior echo from 02/2020. Cardiology was consulted and scheduled the patient for a cardiac cath on 10/17. - Started on Losartan 25 mg daily - Continue heparin drip - Continue statin  - NPO at midnight for cath tomorrow - Cardiology consulted, appreciate recommendations   #Afib with RVR Patient initially presented in Afib with RVR but spontaneously converted and remains in normal sinus rhythm. She did have a sinus pause of 5.38 seconds after receiving a dose of IV lopressor, and then this was discontinued.  - Continue cardiac monitoring - Cardiology consulted  #Type 2 diabetes mellitus #Autonomic dysfunction  #Diabetic neuropathy The patient is on levemir 15u qhs, novolog 3u TID with meals, metformin 1000 mg BID, and tradjgenta 5 mg daily at home for her diabetes. The patient's daughter states that she is not sure how often her mother takes these medications, as she often forgets. A1c 12.1 (was w 13.5 2 months ago). She also has chronic numbness/tingling in her fingers and toes from her diabetic neuropathy and is on gabapentin 300 mg TID. Patient's home insulin regimen cut in half given her nausea, vomiting, and poor oral intake.  - Levemir 7u qhs with SSI  - CBG monitoring q4h  - Family is requesting home health nurse/aide to check on patient and to assist with medications, TOC consulted  #UTI  Patient denies any dysuria, urinary frequency/urgency, hematuria, flank pain, or any other symptoms at this  time. Was found to have a UTI on 10/11 with large leukocytes, >50 WBC, and some budding yeast present. Started on Keflex 500 mg QID, although she  was unable to compete this course. - Rocephin 1 g q24h and Diflucan IVPB 200 mg x 7 days  #Anemia of chronic disease  Hb remains stable at 10.9, baseline around 12. No episodes of melena, hematochezia, hematuria, hematemesis. B12 and folate normal. Iron and ferritin also within normal limits, although TIBC low.   - Continue to monitor CBC  #Hypertension Patient normally has low blood pressures and was started on midodrine at her last admission. She was found to be hypertensive, but denies any headache, chest pain, palpitations, shortness of breath, or any other symptoms.  - Started on Norvasc 2.5 mg BID and losartan 25 mg daily on 10/16 - Continue to monitor BP, may need midodrine in future if hypotension recurs   #Hyperlipidemia - Lipitor 40 mg daily  #Glaucoma, left eye - Continue cosopt eye drops BID    Best practices: Code status: Full  Diet: NPO Fluids: None DVT prophylaxis: Heparin  PT/OT recommendations: pending  Family contact: Daughter at the bedside, served as Optometrist during Futures trader: Admit patient to Inpatient with expected length of stay greater than 2 midnights.  SignedDorethea Clan, DO 02/04/2021, 7:03 PM  Pager: @MYPAGER @ After 5pm on weekdays and 1pm on weekends: On Call pager: [phone]

## 2021-02-04 NOTE — Progress Notes (Signed)
ANTICOAGULATION CONSULT NOTE - Initial Consult  Pharmacy Consult for Heparin Indication: chest pain/ACS  No Known Allergies  Patient Measurements: Height: 5\' 6"  (167.6 cm) Weight: 72.2 kg (159 lb 2.8 oz) IBW/kg (Calculated) : 59.3 HEPARIN DW (KG): 72.2   Vital Signs: Temp: 98.6 F (37 C) (10/16 2006) Temp Source: Oral (10/16 2006) BP: 123/59 (10/16 2006) Pulse Rate: 87 (10/16 2006)  Labs: Recent Labs    02/03/21 1547 02/03/21 2212 02/04/21 0013 02/04/21 0215 02/04/21 0506 02/04/21 0830 02/04/21 1130 02/04/21 2043  HGB 12.7  --   --  9.9*  --   --   --  10.9*  HCT 39.6  --   --  29.9*  --   --   --  33.7*  PLT 163  --   --  146*  --   --   --  164  APTT  --   --   --   --  29  --   --  38*  LABPROT  --   --   --   --  15.2  --   --   --   INR  --   --   --   --  1.2  --   --   --   HEPARINUNFRC  --   --   --   --  >1.10*  --   --   --   CREATININE 1.00 1.14*  --  1.08*  --   --   --  0.88  CKTOTAL  --  116  --   --   --   --   --   --   TROPONINIHS  --  1,611*   < > 2,170*  --  2,176* 1,507*  --    < > = values in this interval not displayed.     Estimated Creatinine Clearance: 78.7 mL/min (by C-G formula based on SCr of 0.88 mg/dL).   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  She has not seen a dentist in about one year.  Dentist is on [address].  No fevers chills or systemic symptoms.  Diabetes has been poorly controlled for some time.  She said it has improved recently averaging around 140.  Called the dentist said they would not see patients until May 14th but the urgency o   Pap smear abnormality of cervix with LGSIL    Routine/ritual circumcision    Type II diabetes mellitus ([place])    Uveitis     Medications:  Medications Prior to Admission  Medication Sig Dispense Refill Last Dose   apixaban (ELIQUIS) 5 MG TABS tablet Take 1 tablet (5 mg total)  by mouth 2 (two) times daily. 180 tablet 0 02/02/2021 at 2400   atorvastatin (LIPITOR) 40 MG tablet Take 1 tablet (40 mg total) by mouth daily. 90 tablet 3 02/02/2021   brimonidine (ALPHAGAN) 0.2 % ophthalmic solution Place 1 drop into the left eye every 8 (eight) hours.   02/02/2021   calcium citrate-vitamin D (CITRACAL+D) 315-200 MG-UNIT tablet Take 2 tablets by mouth 2 (two) times daily. 360 tablet 3 Past Week   cephALEXin (KEFLEX) 500 MG capsule Take 1 capsule (500 mg total) by mouth 4 (four) times daily for 5 days. 20 capsule 0 02/02/2021   diclofenac Sodium (VOLTAREN) 1 % GEL Apply 2 g topically 4 (four) times daily as needed (foot pain).   Past Week   dorzolamide-timolol (COSOPT) 22.3-6.8 MG/ML ophthalmic solution Place 1 drop into the left eye 2 (two) times daily.   Past Week   gabapentin (NEURONTIN) 300 MG capsule Take 1 capsule (300 mg total) by mouth 3 (three) times daily. 270 capsule 3 02/02/2021   insulin aspart (NOVOLOG) 100 UNIT/ML FlexPen Inject 3 Units into the skin 3 (three) times daily with meals. 80 mL 0 02/02/2021   insulin detemir (LEVEMIR) 100 UNIT/ML FlexPen Inject 15 Units into the skin at bedtime 15 mL 0 02/02/2021   linagliptin (TRADJENTA) 5 MG TABS tablet Take 1 tablet (5 mg total) by mouth daily. 90 tablet 0  Past Week   metFORMIN (GLUCOPHAGE-XR) 500 MG 24 hr tablet TAKE 2 TABLETS (1,000 MG TOTAL) BY MOUTH IN THE MORNING AND AT BEDTIME. 360 tablet 1 Past Week   Accu-Chek FastClix Lancets MISC Check blood sugar 4 times a day 200 each 7    ACCU-CHEK GUIDE test strip CHECK BLOOD SUGAR 4 TIMES PER DAY 150 strip 12    Blood Glucose Monitoring Suppl (ACCU-CHEK GUIDE) w/Device KIT 1 each by Does not apply route 4 (four) times daily. 1 kit 1    Insulin Pen Needle (B-D UF III MINI PEN NEEDLES) 31G X 5 MM MISC Use pen needle daily for injections 100 each 2    Insulin Pen Needle (PENTIPS) 32G X 4 MM MISC Use as directed with insulin pen 100 each 3    metoCLOPramide (REGLAN) 5 MG tablet Take 1 tablet (5 mg total) by mouth 4 (four) times daily -  before meals and at bedtime. 120 tablet 0    Infusions:   sodium chloride 100 mL/hr at 02/04/21 1827   cefTRIAXone (ROCEPHIN)  IV 1 g (02/04/21 1843)   fluconazole (DIFLUCAN) IV 200 mg (02/04/21 1715)   heparin 900 Units/hr (02/04/21 1249)    Assessment: 85 yoF on apixaban for Afib.  Admitted with elevated troponin and symptomatic Afib. Pharmacy consulted to start heparin. Last dose apixaban: 10/15 @ 2215. Baseline labs: CBC WNL.  Scr mildly elevated at 1.14    Baseline coags ordered- anticipate heparin level will be elevated due to apixaban use and will therefore monitor heparin using aptt levels until apixaban eliminated/heparin levels correlate with aptt levels.   Initial aPTT subtherapeutic at 38 seconds on 900 units/hr, no infusion issues  Goal of Therapy:  Heparin level 0.3-0.7 units/ml aPTT 66-102 seconds Monitor platelets by anticoagulation protocol: Yes   Plan:  Increase heparin gtt to 1100 units/hr F/u 6 hour aPTT/HL  F/u ability to transition back to Christine, PharmD Clinical Pharmacist ED Pharmacist Phone # [phone] 02/04/2021 9:31 PM

## 2021-02-04 NOTE — H&P (View-Only) (Signed)
Referring Physician: Irine Seal  Aberdeen Hafen is an 50 y.o. female.                       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 Her Troponin I are mildly elevated. She has medication non-compliance. Her LDL cholesterol is 142 mg. Her EKG today and chest x-ray on admission are unremarkable.  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  She has not seen a dentist in about one year.  Dentist is on [address].  No fevers chills or systemic symptoms.  Diabetes has been poorly controlled for some time.  She said it has improved recently averaging around 140.  Called the dentist said they would not see patients until May 14th but the urgency o   Pap smear abnormality of cervix with LGSIL    Routine/ritual circumcision    Type II diabetes mellitus ([place])    Uveitis       Past Surgical History:  Procedure Laterality Date   CESAREAN SECTION     x 1   EYE SURGERY Bilateral    Lasik    MULTIPLE TOOTH EXTRACTIONS     bottom denture   ORIF ANKLE FRACTURE Right 05/05/2020   Procedure: OPEN REDUCTION INTERNAL FIXATION (ORIF) ANKLE FRACTURE;  Surgeon: Shona Needles, MD;  Location: [facility];  Service: Orthopedics;  Laterality: Right;    Family History  Problem Relation Age of Onset   Diabetes Father    Hypertension Other    Social History:  reports that she has never smoked. She has never used smokeless tobacco. She reports that she does not drink alcohol and does not use drugs.  Allergies: No Known Allergies  Medications Prior to Admission  Medication Sig Dispense Refill   apixaban (ELIQUIS) 5 MG TABS tablet Take 1 tablet (5 mg total) by mouth 2 (two) times daily. 180 tablet 0   atorvastatin (LIPITOR) 40 MG tablet Take 1 tablet (40 mg total) by mouth daily. 90 tablet 3   brimonidine (ALPHAGAN) 0.2 % ophthalmic solution Place 1 drop into the left eye every 8 (eight) hours.     calcium citrate-vitamin D (CITRACAL+D) 315-200 MG-UNIT tablet Take 2 tablets by mouth 2 (two) times daily. 360 tablet 3   cephALEXin (KEFLEX) 500 MG capsule Take 1 capsule (500 mg total) by mouth 4 (four) times daily for 5 days. 20 capsule 0   diclofenac Sodium (VOLTAREN) 1 % GEL Apply 2  g topically 4 (four) times daily as needed (foot pain).     dorzolamide-timolol (COSOPT) 22.3-6.8 MG/ML ophthalmic solution Place 1 drop into the left eye 2 (two) times daily.     gabapentin (NEURONTIN) 300 MG capsule Take 1 capsule (300 mg total) by mouth 3 (three) times daily. 270 capsule 3   insulin aspart (NOVOLOG) 100 UNIT/ML FlexPen Inject 3 Units into the skin 3 (three) times daily with meals. 80 mL 0   insulin detemir (LEVEMIR) 100 UNIT/ML FlexPen Inject 15 Units into the skin at bedtime 15 mL 0   linagliptin (TRADJENTA) 5 MG TABS tablet Take 1 tablet (5 mg total) by mouth daily. 90 tablet 0   metFORMIN (GLUCOPHAGE-XR) 500 MG 24 hr tablet TAKE 2 TABLETS (1,000 MG TOTAL) BY MOUTH IN THE MORNING AND AT BEDTIME.  360 tablet 1   Accu-Chek FastClix Lancets MISC Check blood sugar 4 times a day 200 each 7   ACCU-CHEK GUIDE test strip CHECK BLOOD SUGAR 4 TIMES PER DAY 150 strip 12   Blood Glucose Monitoring Suppl (ACCU-CHEK GUIDE) w/Device KIT 1 each by Does not apply route 4 (four) times daily. 1 kit 1   Insulin Pen Needle (B-D UF III MINI PEN NEEDLES) 31G X 5 MM MISC Use pen needle daily for injections 100 each 2   Insulin Pen Needle (PENTIPS) 32G X 4 MM MISC Use as directed with insulin pen 100 each 3   metoCLOPramide (REGLAN) 5 MG tablet Take 1 tablet (5 mg total) by mouth 4 (four) times daily -  before meals and at bedtime. 120 tablet 0    Results for orders placed or performed during the hospital encounter of 02/03/21 (from the past 48 hour(s))  Resp Panel by RT-PCR (Flu A&B, Covid) Nasopharyngeal Swab     Status: None   Collection Time: 02/03/21  1:40 PM   Specimen: Nasopharyngeal Swab; Nasopharyngeal(NP) swabs in vial transport medium  Result Value Ref Range   SARS Coronavirus 2 by RT PCR NEGATIVE NEGATIVE    Comment: (NOTE) SARS-CoV-2 target nucleic acids are NOT DETECTED.  The SARS-CoV-2 RNA is generally detectable in upper respiratory specimens during the acute phase of infection. The lowest concentration of SARS-CoV-2 viral copies this assay can detect is 138 copies/mL. A negative result does not preclude SARS-Cov-2 infection and should not be used as the sole basis for treatment or other patient management decisions. A negative result may occur with  improper specimen collection/handling, submission of specimen other than nasopharyngeal swab, presence of viral mutation(s) within the areas targeted by this assay, and inadequate number of viral copies(<138 copies/mL). A negative result must be combined with clinical observations, patient history, and epidemiological information. The expected result is Negative.  Fact Sheet for Patients:  EntrepreneurPulse.com.au  Fact  Sheet for Healthcare Providers:  IncredibleEmployment.be  This test is no t yet approved or cleared by the [place] FDA and  has been authorized for detection and/or diagnosis of SARS-CoV-2 by FDA under an Emergency Use Authorization (EUA). This EUA will remain  in effect (meaning this test can be used) for the duration of the COVID-19 declaration under Section 564(b)(1) of the Act, 21 U.S.C.section 360bbb-3(b)(1), unless the authorization is terminated  or revoked sooner.       Influenza A by PCR NEGATIVE NEGATIVE   Influenza B by PCR NEGATIVE NEGATIVE    Comment: (NOTE) The Xpert Xpress SARS-CoV-2/FLU/RSV plus assay is intended as an aid in the diagnosis of influenza from Nasopharyngeal swab specimens and should  not be used as a sole basis for treatment. Nasal washings and aspirates are unacceptable for Xpert Xpress SARS-CoV-2/FLU/RSV testing.  Fact Sheet for Patients: EntrepreneurPulse.com.au  Fact Sheet for Healthcare Providers: IncredibleEmployment.be  This test is not yet approved or cleared by the [place] FDA and has been authorized for detection and/or diagnosis of SARS-CoV-2 by FDA under an Emergency Use Authorization (EUA). This EUA will remain in effect (meaning this test can be used) for the duration of the COVID-19 declaration under Section 564(b)(1) of the Act, 21 U.S.C. section 360bbb-3(b)(1), unless the authorization is terminated or revoked.  Performed at [facility], [place] [address], [place], [place] [address]   CBC with Differential/Platelet     Status: None   Collection Time: 02/03/21  3:47 PM  Result Value Ref Range   WBC 5.1 4.0 - 10.5 K/uL   RBC 4.56 3.87 - 5.11 MIL/uL   Hemoglobin 12.7 12.0 - 15.0 g/dL   HCT 39.6 36.0 - 46.0 %   MCV 86.8 80.0 - 100.0 fL   MCH 27.9 26.0 - 34.0 pg   MCHC 32.1 30.0 - 36.0 g/dL   RDW 11.9 11.5 - 15.5 %   Platelets 163 150 - 400 K/uL    nRBC 0.0 0.0 - 0.2 %   Neutrophils Relative % 71 %   Neutro Abs 3.6 1.7 - 7.7 K/uL   Lymphocytes Relative 24 %   Lymphs Abs 1.2 0.7 - 4.0 K/uL   Monocytes Relative 3 %   Monocytes Absolute 0.2 0.1 - 1.0 K/uL   Eosinophils Relative 1 %   Eosinophils Absolute 0.0 0.0 - 0.5 K/uL   Basophils Relative 1 %   Basophils Absolute 0.0 0.0 - 0.1 K/uL   Immature Granulocytes 0 %   Abs Immature Granulocytes 0.02 0.00 - 0.07 K/uL    Comment: Performed at [facility], [place] [address], [place], [place] [address]  Comprehensive metabolic panel     Status: Abnormal   Collection Time: 02/03/21  3:47 PM  Result Value Ref Range   Sodium 134 (L) 135 - 145 mmol/L   Potassium 5.1 3.5 - 5.1 mmol/L   Chloride 103 98 - 111 mmol/L   CO2 19 (L) 22 - 32 mmol/L   Glucose, Bld 336 (H) 70 - 99 mg/dL    Comment: Glucose reference range applies only to samples taken after fasting for at least 8 hours.   BUN 28 (H) 6 - 20 mg/dL   Creatinine, Ser 1.00 0.44 - 1.00 mg/dL   Calcium 9.3 8.9 - 10.3 mg/dL   Total Protein 7.8 6.5 - 8.1 g/dL   Albumin 3.5 3.5 - 5.0 g/dL   AST 28 15 - 41 U/L   ALT 13 0 - 44 U/L   Alkaline Phosphatase 81 38 - 126 U/L   Total Bilirubin 1.3 (H) 0.3 - 1.2 mg/dL   GFR, Estimated >60 >60 mL/min    Comment: (NOTE) Calculated using the CKD-EPI Creatinine Equation (2021)    Anion gap 12 5 - 15    Comment: Performed at [facility], [place] [address], [place], [place] [address]  Urinalysis, Routine w reflex microscopic     Status: Abnormal   Collection Time: 02/03/21  3:47 PM  Result Value Ref Range   Color, Urine YELLOW YELLOW   APPearance HAZY (A) CLEAR   Specific Gravity, Urine 1.021 1.005 - 1.030   pH 5.0 5.0 - 8.0   Glucose, UA >=500 (A) NEGATIVE mg/dL  Hgb urine dipstick SMALL (A) NEGATIVE   Bilirubin Urine NEGATIVE NEGATIVE   Ketones, ur 5 (A) NEGATIVE mg/dL   Protein, ur 100 (A) NEGATIVE mg/dL   Nitrite NEGATIVE NEGATIVE   Leukocytes,Ua  SMALL (A) NEGATIVE   RBC / HPF 11-20 0 - 5 RBC/hpf   WBC, UA 21-50 0 - 5 WBC/hpf   Bacteria, UA NONE SEEN NONE SEEN   Squamous Epithelial / LPF 0-5 0 - 5   Mucus PRESENT    Budding Yeast PRESENT    Hyaline Casts, UA PRESENT    Non Squamous Epithelial 0-5 (A) NONE SEEN    Comment: Performed at [facility], [place] [address] [place], [place] [address]  Blood gas, venous     Status: Abnormal   Collection Time: 02/03/21  3:47 PM  Result Value Ref Range   pH, Ven 7.249 (L) 7.250 - 7.430   pCO2, Ven 52.2 44.0 - 60.0 mmHg   pO2, Ven 32.4 32.0 - 45.0 mmHg   Bicarbonate 22.0 20.0 - 28.0 mmol/L   Acid-base deficit 5.1 (H) 0.0 - 2.0 mmol/L   O2 Saturation 50.9 %   Patient temperature 98.6     Comment: Performed at [facility], [place] [address], [place], [place] [address]  I-Stat beta hCG blood, ED ([facility], [facility], AP only)     Status: None   Collection Time: 02/03/21  3:52 PM  Result Value Ref Range   I-stat hCG, quantitative <5.0 <5 mIU/mL   Comment 3            Comment:   GEST. AGE      CONC.  (mIU/mL)   <=1 WEEK        5 - 50     2 WEEKS       50 - 500     3 WEEKS       100 - 10,000     4 WEEKS     1,000 - 30,000        FEMALE AND NON-PREGNANT FEMALE:     LESS THAN 5 mIU/mL   Glucose, capillary     Status: Abnormal   Collection Time: 02/03/21 10:09 PM  Result Value Ref Range   Glucose-Capillary 278 (H) 70 - 99 mg/dL    Comment: Glucose reference range applies only to samples taken after fasting for at least 8 hours.  Lactic acid, plasma     Status: None   Collection Time: 02/03/21 10:12 PM  Result Value Ref Range   Lactic Acid, Venous 1.4 0.5 - 1.9 mmol/L    Comment: Performed at [facility], [place] [address] [place],  [address]  Basic metabolic panel     Status: Abnormal   Collection Time: 02/03/21 10:12 PM  Result Value Ref Range   Sodium 137 135 - 145 mmol/L   Potassium 4.0 3.5 - 5.1 mmol/L    Comment: DELTA CHECK NOTED    Chloride 104 98 - 111 mmol/L   CO2 22 22 - 32 mmol/L   Glucose, Bld 313 (H) 70 - 99 mg/dL    Comment: Glucose reference range applies only to samples taken after fasting for at least 8 hours.   BUN 34 (H) 6 - 20 mg/dL   Creatinine, Ser 1.14 (H) 0.44 - 1.00 mg/dL   Calcium 8.9 8.9 - 10.3 mg/dL   GFR, Estimated 59 (L) >60 mL/min    Comment: (NOTE) Calculated using the CKD-EPI Creatinine Equation (2021)    Anion  gap 11 5 - 15    Comment: Performed at [facility], [place] [address], [place], [place] [address]  Beta-hydroxybutyric acid     Status: Abnormal   Collection Time: 02/03/21 10:12 PM  Result Value Ref Range   Beta-Hydroxybutyric Acid 1.34 (H) 0.05 - 0.27 mmol/L    Comment: Performed at [facility], [place] [address], [place], [place] [address]  Magnesium     Status: None   Collection Time: 02/03/21 10:12 PM  Result Value Ref Range   Magnesium 1.9 1.7 - 2.4 mg/dL    Comment: Performed at [facility], [place] [address], [place], [place] [address]  Phosphorus     Status: Abnormal   Collection Time: 02/03/21 10:12 PM  Result Value Ref Range   Phosphorus 4.9 (H) 2.5 - 4.6 mg/dL    Comment: Performed at [facility], [place] [address] [place], [place] [address]  CK     Status: None   Collection Time: 02/03/21 10:12 PM  Result Value Ref Range   Total CK 116 38 - 234 U/L    Comment: Performed at [facility], [place] [address] [place], [place] [address]  Troponin I (High Sensitivity)     Status: Abnormal   Collection Time: 02/03/21 10:12 PM  Result Value Ref Range   Troponin I (High Sensitivity) 1,611 (HH) <18 ng/L    Comment: CRITICAL RESULT CALLED TO, READ BACK BY AND VERIFIED WITH: TADDEO J _0  BY BATTLET (NOTE) Elevated high sensitivity troponin I (hsTnI) values and significant  changes across serial measurements may suggest ACS but many other  chronic and acute conditions are known to  elevate hsTnI results.  Refer to the Links section for chest pain algorithms and additional  guidance. Performed at [facility], [place] [address] [place], [place] [address]   Hemoglobin A1c     Status: Abnormal   Collection Time: 02/03/21 10:12 PM  Result Value Ref Range   Hgb A1c MFr Bld 12.1 (H) 4.8 - 5.6 %    Comment: (NOTE) Pre diabetes:          5.7%-6.4%  Diabetes:              >6.4%  Glycemic control for   <7.0% adults with diabetes    Mean Plasma Glucose 300.57 mg/dL    Comment: Performed at [place] [address], [place], [place] [address]  Troponin I (High Sensitivity)     Status: Abnormal   Collection Time: 02/04/21 12:13 AM  Result Value Ref Range   Troponin I (High Sensitivity) 2,121 (HH) <18 ng/L    Comment: DELTA CHECK NOTED CRITICAL RESULT CALLED TO, READ BACK BY AND VERIFIED WITH: TADDEO J _1  BY BATTLET (NOTE) Elevated high sensitivity troponin I (hsTnI) values and significant  changes across serial measurements may suggest ACS but many other  chronic and acute conditions are known to elevate hsTnI results.  Refer to the Links section for chest pain algorithms and additional  guidance. Performed at [facility], [place] [address] [place], [place] [address]   Glucose, capillary     Status: Abnormal   Collection Time: 02/04/21 12:42 AM  Result Value Ref Range   Glucose-Capillary 318 (H) 70 - 99 mg/dL    Comment: Glucose reference range applies only to samples taken after fasting for at least 8 hours.   Comment 1 Notify RN   Magnesium     Status: None  Collection Time: 02/04/21  2:15 AM  Result Value Ref Range   Magnesium 1.9 1.7 - 2.4 mg/dL    Comment: Performed at [facility], [place] [address], [place], [place] [address]  Phosphorus     Status: None   Collection Time: 02/04/21  2:15 AM  Result Value Ref Range   Phosphorus 3.9 2.5 - 4.6 mg/dL    Comment: Performed at [facility], [place] [address] [place], [place] [address]  TSH     Status: None   Collection Time: 02/04/21  2:15 AM  Result Value Ref Range   TSH 1.284 0.350 - 4.500 uIU/mL    Comment: Performed by a 3rd Generation assay with a functional sensitivity of <=0.01 uIU/mL. Performed at [facility], [place] [address] [place], [place] [address]   Comprehensive metabolic panel     Status: Abnormal   Collection Time: 02/04/21  2:15 AM  Result Value Ref Range   Sodium 138 135 - 145 mmol/L   Potassium 3.6 3.5 - 5.1 mmol/L   Chloride 106 98 - 111 mmol/L   CO2 25 22 - 32 mmol/L   Glucose, Bld 344 (H) 70 - 99 mg/dL    Comment: Glucose reference range applies only to samples taken after fasting for at least 8 hours.   BUN 34 (H) 6 - 20 mg/dL   Creatinine, Ser 1.08 (H) 0.44 - 1.00 mg/dL   Calcium 8.8 (L) 8.9 - 10.3 mg/dL   Total Protein 6.2 (L) 6.5 - 8.1 g/dL   Albumin 2.9 (L) 3.5 - 5.0 g/dL   AST 22 15 - 41 U/L   ALT 10 0 - 44 U/L   Alkaline Phosphatase 61 38 - 126 U/L   Total Bilirubin 0.3 0.3 - 1.2 mg/dL   GFR, Estimated >60 >60 mL/min    Comment: (NOTE) Calculated using the CKD-EPI Creatinine Equation (2021)    Anion gap 7 5 - 15    Comment: Performed at [facility], [place] [address], [place], [place] [address]  Troponin I (High Sensitivity)     Status: Abnormal   Collection Time: 02/04/21  2:15 AM  Result Value Ref Range   Troponin I (High Sensitivity) 2,170 (HH) <18 ng/L    Comment: CRITICAL RESULT CALLED TO, READ BACK BY AND VERIFIED WITH: TADDEO J _0  BY BATTLET (NOTE) Elevated high sensitivity troponin I (hsTnI) values and significant  changes across serial measurements may suggest ACS but many other  chronic and acute conditions are known to elevate hsTnI results.  Refer to the Links section for chest pain algorithms and additional  guidance. Performed at [facility], [place] [address], [place], [place] [address]    CBC WITH DIFFERENTIAL     Status: Abnormal   Collection Time: 02/04/21  2:15 AM  Result Value Ref Range   WBC 5.3 4.0 - 10.5 K/uL   RBC 3.49 (L) 3.87 - 5.11 MIL/uL   Hemoglobin 9.9 (L) 12.0 - 15.0 g/dL   HCT 29.9 (L) 36.0 - 46.0 %   MCV 85.7 80.0 - 100.0 fL   MCH 28.4 26.0 - 34.0 pg   MCHC 33.1 30.0 - 36.0 g/dL   RDW 12.0 11.5 - 15.5 %   Platelets 146 (L) 150 - 400 K/uL   nRBC 0.0 0.0 - 0.2 %   Neutrophils Relative % 48 %   Neutro Abs 2.5 1.7 - 7.7 K/uL   Lymphocytes Relative 44 %   Lymphs Abs  2.3 0.7 - 4.0 K/uL   Monocytes Relative 6 %   Monocytes Absolute 0.3 0.1 - 1.0 K/uL   Eosinophils Relative 2 %   Eosinophils Absolute 0.1 0.0 - 0.5 K/uL   Basophils Relative 0 %   Basophils Absolute 0.0 0.0 - 0.1 K/uL   Immature Granulocytes 0 %   Abs Immature Granulocytes 0.01 0.00 - 0.07 K/uL    Comment: Performed at [facility], [place] [address] [place], [place] [address]  Vitamin B12     Status: None   Collection Time: 02/04/21  2:15 AM  Result Value Ref Range   Vitamin B-12 350 180 - 914 pg/mL    Comment: (NOTE) This assay is not validated for testing neonatal or myeloproliferative syndrome specimens for Vitamin B12 levels. Performed at [facility], [place] [address] [place], [place] [address]   Iron and TIBC     Status: Abnormal   Collection Time: 02/04/21  2:15 AM  Result Value Ref Range   Iron 36 28 - 170 ug/dL   TIBC 219 (L) 250 - 450 ug/dL   Saturation Ratios 16 10.4 - 31.8 %   UIBC 183 ug/dL    Comment: Performed at [facility], [place] [address], [place], [place] [address]  Ferritin     Status: None   Collection Time: 02/04/21  2:15 AM  Result Value Ref Range   Ferritin 49 11 - 307 ng/mL    Comment: Performed at [facility], [place] [address], [place], [place] [address]  Lipid panel     Status: Abnormal   Collection Time: 02/04/21  2:15 AM  Result Value Ref Range   Cholesterol 212 (H) 0 - 200  mg/dL   Triglycerides 81 <150 mg/dL   HDL 54 >40 mg/dL   Total CHOL/HDL Ratio 3.9 RATIO   VLDL 16 0 - 40 mg/dL   LDL Cholesterol 142 (H) 0 - 99 mg/dL    Comment:        Total Cholesterol/HDL:CHD Risk Coronary Heart Disease Risk Table                     Men   Women  1/2 Average Risk   3.4   3.3  Average Risk       5.0   4.4  2 X Average Risk   9.6   7.1  3 X Average Risk  23.4   11.0        Use the calculated Patient Ratio above and the CHD Risk Table to determine the patient's CHD Risk.        ATP III CLASSIFICATION (LDL):  <100     mg/dL   Optimal  100-129  mg/dL   Near or Above                    Optimal  130-159  mg/dL   Borderline  160-189  mg/dL   High  >190     mg/dL   Very High Performed at [place] [address], [place], [place] [address]   Glucose, capillary     Status: Abnormal   Collection Time: 02/04/21  4:11 AM  Result Value Ref Range   Glucose-Capillary 200 (H) 70 - 99 mg/dL    Comment: Glucose reference range applies only to samples taken after fasting for at least 8 hours.  Heparin level (unfractionated)     Status: Abnormal   Collection Time: 02/04/21  5:06 AM  Result Value Ref Range   Heparin Unfractionated >1.10 (H) 0.30 - 0.70 IU/mL    Comment: (NOTE) The clinical reportable range upper limit is being lowered to >1.10 to align with the FDA approved guidance for the current laboratory assay.  If heparin results are below expected values, and patient dosage has  been confirmed, suggest follow up testing of antithrombin III levels. Performed at [facility], [place] [address] [place], [place] [address]   Protime-INR     Status: None   Collection Time: 02/04/21  5:06 AM  Result Value Ref Range   Prothrombin Time 15.2 11.4 - 15.2 seconds   INR 1.2 0.8 - 1.2    Comment: (NOTE) INR goal varies based on device and disease states. Performed at [facility], [place] [address], [place], [place] [address]   APTT     Status: None   Collection Time: 02/04/21  5:06 AM  Result Value Ref Range   aPTT 29 24 - 36 seconds    Comment: Performed at [facility], [place] [address], [place], [place] [address]  Glucose, capillary     Status: Abnormal   Collection Time: 02/04/21  7:45 AM  Result Value Ref Range   Glucose-Capillary 65 (L) 70 - 99 mg/dL    Comment: Glucose reference range applies only to samples taken after fasting for at least 8 hours.  Troponin I (High Sensitivity)     Status: Abnormal   Collection Time: 02/04/21  8:30 AM  Result Value Ref Range   Troponin I (High Sensitivity) 2,176 (HH) <18 ng/L    Comment: CRITICAL VALUE NOTED.  VALUE IS CONSISTENT WITH PREVIOUSLY REPORTED AND CALLED VALUE. (NOTE) Elevated high sensitivity troponin I (hsTnI) values and significant  changes across serial measurements may suggest ACS but many other  chronic and acute conditions are known to elevate hsTnI results.  Refer to the Links section for chest pain algorithms and additional  guidance. Performed at [facility], [place] [address], [place], [place] [address]   Glucose, capillary     Status: Abnormal   Collection Time: 02/04/21  9:11 AM  Result Value Ref Range   Glucose-Capillary 114 (H) 70 - 99 mg/dL    Comment: Glucose reference range applies only to samples taken after fasting for at least 8 hours.   DG CHEST PORT 1 VIEW  Result Date: 02/03/2021 CLINICAL DATA:  Confusion, vomiting.  Recent UTI.  Hypertension. EXAM: PORTABLE CHEST 1 VIEW COMPARISON:  11/07/2020 FINDINGS: Heart and mediastinal contours are within normal limits. No focal opacities or effusions. No acute bony abnormality. IMPRESSION: No active disease. Electronically Signed   By: Rolm Baptise M.D.   On: 02/03/2021 20:34   CT Renal Stone Study  Result Date: 02/03/2021 CLINICAL DATA:  Flank pain, recent UTI EXAM: CT ABDOMEN AND PELVIS WITHOUT CONTRAST TECHNIQUE:  Multidetector CT imaging of the abdomen and pelvis was performed following the standard protocol without IV contrast. COMPARISON:  05/16/2020 FINDINGS: Lower chest: Lung bases are clear. Hepatobiliary: Unenhanced liver is unremarkable. Suspected gallbladder sludge versus noncalcified gallstones, without associated inflammatory changes. No intrahepatic or extrahepatic ductal dilatation. Pancreas: Within normal limits. Spleen: Within normal limits. Adrenals/Urinary Tract: Adrenal glands are within normal limits. Kidneys are within normal limits. No renal calculi or hydronephrosis. Bladder is within normal limits. Stomach/Bowel: Stomach is within normal limits. No evidence of bowel obstruction. Normal appendix (series 2/image 52). No colonic wall thickening or inflammatory changes. Vascular/Lymphatic: No  evidence of abdominal aortic aneurysm. Mild vascular calcifications. No suspicious abdominopelvic lymphadenopathy. Reproductive: Anterior position of the uterus suggests prior C-section. No adnexal masses. Other: No abdominopelvic ascites. Musculoskeletal: Visualized osseous structures are within normal limits. IMPRESSION: No renal, ureteral, or bladder calculi.  No hydronephrosis. Suspected gallbladder sludge versus noncalcified gallstones, without associated inflammatory changes. Electronically Signed   By: Julian Hy M.D.   On: 02/03/2021 19:18    Review Of Systems Constitutional: No fever, chills, weight loss or gain. Eyes: Left eye vision loss, wears glasses. No discharge or pain. Ears: No hearing loss, No tinnitus. Respiratory: No asthma, COPD, pneumonias. No shortness of breath. No hemoptysis. Cardiovascular: No chest pain, positive palpitation, no leg edema. Gastrointestinal: No nausea, vomiting, diarrhea, constipation. No GI bleed. No hepatitis. Genitourinary: No dysuria, hematuria, kidney stone. No incontinance. Neurological: Positive headache, no stroke, seizures.  Psychiatry: No psych  facility admission for anxiety, depression, suicide. No detox. Skin: No rash. Musculoskeletal: No joint pain, fibromyalgia. No neck pain, back pain. Lymphadenopathy: No lymphadenopathy. Hematology: No anemia or easy bruising.   Blood pressure (!) 184/77, pulse 79, temperature 98.3 F (36.8 C), temperature source Oral, resp. rate 12, height _0  (1.626 m), weight 72.6 kg, last menstrual period 08/21/2016, SpO2 99 %. Body mass index is 27.46 kg/m. General appearance: alert, cooperative, appears stated age and no distress Head: Normocephalic, atraumatic. Eyes: Brown eyes, pink conjunctiva, Left cornea hazy. Neck: No adenopathy, no carotid bruit, no JVD, supple, symmetrical, trachea midline and thyroid not enlarged. Resp: Clear to auscultation bilaterally. Cardio: Regular rate and rhythm, S1, S2 normal, II/VI systolic murmur, no click, rub or gallop GI: Soft, non-tender; bowel sounds normal; no organomegaly. Extremities: No edema, cyanosis or clubbing. Chronic right ankle mild swelling. Skin: Warm and dry.  Neurologic: Alert and oriented X 3, normal strength. Normal coordination.  Assessment/Plan Paroxysmal atrial fibrillation, CHA2DS2VASc score of 3 Small NSTEMI HTN Type 2 DM with hyperglycemia Hyperlipidemia Left eye glaucoma with vision loss Headache  Plan: Discussed need for cardiac catheterization with patient and daughter. Continue IV heparin. Awaiting echocardiogram.  Time spent: Review of old records, Lab, x-rays, EKG, other cardiac tests, examination, discussion with patient/Family over 70 minutes.  Birdie Riddle, MD  02/04/2021, 9:53 AM

## 2021-02-04 NOTE — Plan of Care (Signed)
  Problem: Clinical Measurements: Goal: Diagnostic test results will improve Outcome: Progressing   

## 2021-02-05 ENCOUNTER — Encounter (HOSPITAL_COMMUNITY): Payer: Self-pay | Admitting: Cardiovascular Disease

## 2021-02-05 ENCOUNTER — Encounter (HOSPITAL_COMMUNITY)
Admission: EM | Disposition: A | Discharge status: Home / Self Care | Payer: Self-pay | Source: Home / Self Care | Attending: Internal Medicine

## 2021-02-05 DIAGNOSIS — I214 Non-ST elevation (NSTEMI) myocardial infarction: Principal | ICD-10-CM

## 2021-02-05 HISTORY — PX: LEFT HEART CATH AND CORONARY ANGIOGRAPHY: CATH118249

## 2021-02-05 LAB — CBC
HCT: 30.7 % — ABNORMAL LOW (ref 36.0–46.0)
Hemoglobin: 9.7 g/dL — ABNORMAL LOW (ref 12.0–15.0)
MCH: 27.6 pg (ref 26.0–34.0)
MCHC: 31.6 g/dL (ref 30.0–36.0)
MCV: 87.2 fL (ref 80.0–100.0)
Platelets: 147 10*3/uL — ABNORMAL LOW (ref 150–400)
RBC: 3.52 MIL/uL — ABNORMAL LOW (ref 3.87–5.11)
RDW: 12.2 % (ref 11.5–15.5)
WBC: 5.3 10*3/uL (ref 4.0–10.5)
nRBC: 0 % (ref 0.0–0.2)

## 2021-02-05 LAB — APTT: aPTT: 46 seconds — ABNORMAL HIGH (ref 24–36)

## 2021-02-05 LAB — HEPARIN LEVEL (UNFRACTIONATED): Heparin Unfractionated: 0.74 IU/mL — ABNORMAL HIGH (ref 0.30–0.70)

## 2021-02-05 LAB — URINE CULTURE

## 2021-02-05 LAB — GLUCOSE, CAPILLARY
Glucose-Capillary: 122 mg/dL — ABNORMAL HIGH (ref 70–99)
Glucose-Capillary: 141 mg/dL — ABNORMAL HIGH (ref 70–99)
Glucose-Capillary: 143 mg/dL — ABNORMAL HIGH (ref 70–99)
Glucose-Capillary: 164 mg/dL — ABNORMAL HIGH (ref 70–99)
Glucose-Capillary: 192 mg/dL — ABNORMAL HIGH (ref 70–99)
Glucose-Capillary: 243 mg/dL — ABNORMAL HIGH (ref 70–99)
Glucose-Capillary: 334 mg/dL — ABNORMAL HIGH (ref 70–99)

## 2021-02-05 SURGERY — LEFT HEART CATH AND CORONARY ANGIOGRAPHY
Anesthesia: LOCAL

## 2021-02-05 MED ORDER — IOHEXOL 350 MG/ML SOLN
INTRAVENOUS | Status: DC | PRN
  Administered 2021-02-05: 30 mL via INTRA_ARTERIAL

## 2021-02-05 MED ORDER — VERAPAMIL HCL 2.5 MG/ML IV SOLN
INTRAVENOUS | Status: AC
  Filled 2021-02-05: qty 2

## 2021-02-05 MED ORDER — HYDRALAZINE HCL 20 MG/ML IJ SOLN
INTRAMUSCULAR | Status: AC
  Filled 2021-02-05: qty 1

## 2021-02-05 MED ORDER — HYDRALAZINE HCL 20 MG/ML IJ SOLN
10.0000 mg | INTRAMUSCULAR | Status: AC | PRN
  Administered 2021-02-05: 10 mg via INTRAVENOUS

## 2021-02-05 MED ORDER — LABETALOL HCL 5 MG/ML IV SOLN: 10.0000 mg | INTRAVENOUS | Status: AC | PRN

## 2021-02-05 MED ORDER — MIDAZOLAM HCL 2 MG/2ML IJ SOLN
INTRAMUSCULAR | Status: AC
  Filled 2021-02-05: qty 2

## 2021-02-05 MED ORDER — LIDOCAINE HCL (PF) 1 % IJ SOLN
INTRAMUSCULAR | Status: AC
  Filled 2021-02-05: qty 30

## 2021-02-05 MED ORDER — SODIUM CHLORIDE 0.9 % IV SOLN: 250.0000 mL | INTRAVENOUS | Status: DC | PRN

## 2021-02-05 MED ORDER — HEPARIN (PORCINE) IN NACL 1000-0.9 UT/500ML-% IV SOLN
INTRAVENOUS | Status: AC
  Filled 2021-02-05: qty 500

## 2021-02-05 MED ORDER — SODIUM CHLORIDE 0.9% FLUSH: 3.0000 mL | INTRAVENOUS | Status: DC | PRN

## 2021-02-05 MED ORDER — SODIUM CHLORIDE 0.9 % IV SOLN: INTRAVENOUS | Status: AC

## 2021-02-05 MED ORDER — FENTANYL CITRATE (PF) 100 MCG/2ML IJ SOLN
INTRAMUSCULAR | Status: DC | PRN
  Administered 2021-02-05: 25 ug via INTRAVENOUS

## 2021-02-05 MED ORDER — LIDOCAINE HCL (PF) 1 % IJ SOLN
INTRAMUSCULAR | Status: DC | PRN
  Administered 2021-02-05: 10 mL via SUBCUTANEOUS

## 2021-02-05 MED ORDER — MIDAZOLAM HCL 2 MG/2ML IJ SOLN
INTRAMUSCULAR | Status: DC | PRN
  Administered 2021-02-05: 1 mg via INTRAVENOUS

## 2021-02-05 MED ORDER — SODIUM CHLORIDE 0.9% FLUSH
3.0000 mL | Freq: Two times a day (BID) | INTRAVENOUS | Status: DC
  Administered 2021-02-05: 3 mL via INTRAVENOUS

## 2021-02-05 MED ORDER — HEPARIN SODIUM (PORCINE) 1000 UNIT/ML IJ SOLN
INTRAMUSCULAR | Status: AC
  Filled 2021-02-05: qty 1

## 2021-02-05 MED ORDER — HEPARIN (PORCINE) IN NACL 1000-0.9 UT/500ML-% IV SOLN
INTRAVENOUS | Status: DC | PRN
  Administered 2021-02-05 (×2): 500 mL

## 2021-02-05 MED ORDER — METOPROLOL TARTRATE 12.5 MG HALF TABLET
12.5000 mg | ORAL_TABLET | Freq: Two times a day (BID) | ORAL | Status: DC
  Administered 2021-02-05 – 2021-02-06 (×3): 12.5 mg via ORAL
  Filled 2021-02-05 (×3): qty 1

## 2021-02-05 MED ORDER — FENTANYL CITRATE (PF) 100 MCG/2ML IJ SOLN
INTRAMUSCULAR | Status: AC
  Filled 2021-02-05: qty 2

## 2021-02-05 MED ORDER — ONDANSETRON HCL 4 MG/2ML IJ SOLN: 4.0000 mg | Freq: Four times a day (QID) | INTRAMUSCULAR | Status: DC | PRN

## 2021-02-05 SURGICAL SUPPLY — 7 items
CATH INFINITI 5FR MULTPACK ANG (CATHETERS) ×2 IMPLANT
KIT HEART LEFT (KITS) ×2 IMPLANT
PACK CARDIAC CATHETERIZATION (CUSTOM PROCEDURE TRAY) ×2 IMPLANT
SHEATH PINNACLE 5F 10CM (SHEATH) ×2 IMPLANT
SHEATH PROBE COVER 6X72 (BAG) ×2 IMPLANT
TRANSDUCER W/STOPCOCK (MISCELLANEOUS) ×2 IMPLANT
WIRE EMERALD 3MM-J .035X260CM (WIRE) ×2 IMPLANT

## 2021-02-05 NOTE — Progress Notes (Signed)
Subjective: Patient was seen at bedside rounds this morning with use of interpreter on iPad in room.  The patient is doing well after her cath earlier today and does not have any specific complaints or concerns at this time.  Objective:  Vital signs in last 24 hours: Vitals:   02/05/21 1050 02/05/21 1055 02/05/21 1100 02/05/21 1114  BP: (!) 145/52 (!) 151/54 (!) 160/47 134/74  Pulse: 88 90 89 89  Resp: 16 17 19 14   Temp:    98.6 F (37 C)  TempSrc:    Oral  SpO2: 99% 100% 100% 99%  Weight:      Height:       General: NAD, nl appearance HE: Normocephalic, atraumatic, EOMI, Conjunctivae normal ENT: No congestion, no rhinorrhea, no exudate or erythema  Cardiovascular: Normal rate, regular rhythm. No murmurs, rubs, or gallops Pulmonary: Effort normal, breath sounds normal. No wheezes, rales, or rhonchi Abdominal: soft, nontender, bowel sounds present Musculoskeletal: no swelling, deformity, injury or tenderness in extremities Skin: Warm, dry, no bruising, erythema, or rash.  Right upper thigh cath site with overlying gauze is clean, dry, intact Psychiatric/Behavioral: normal mood, normal behavior     Assessment/Plan:  Principal Problem:   NSTEMI (non-ST elevated myocardial infarction) ([place]) Active Problems:   Type II diabetes mellitus ([place])   Hyperlipidemia   (HFpEF) heart failure with preserved ejection fraction (HCC)   Migraine   Blind left eye   Urinary tract infection with hematuria   CKD (chronic kidney disease) stage 3, GFR 30-59 ml/min (HCC)   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 (HCC)   Atrial fibrillation with RVR (HCC)   Hyperglycemia   Hypertensive urgency   Elevated troponin  #NSTEMI #HFpEF Presented with atypical symptoms and elevated troponins with peaked of 2170. EKG and CXR were unremarkable. Echo performed on 10/16 with EF of 60-65% and grade I diastolic dysfunction, no significant changes noted when compared to prior echo from 02/2020.  Cardiology was consulted with recommendation for cardiac catheterization today. This showed 35% stenosis of the distal LAD, recommendations for medical management with beta-blocker, ARB, and atorvastatin.  Will transition to Eliquis tomorrow. Otherwise, medically stable for DC, pending PT/OT evals at this time. - Cardiology consulted, appreciate recommendations  - Losartan 25 mg daily - Start metoprolol 12.5 mg p.o. twice daily - Continue atorvastatin 40 mg p.o. daily - PT/OT consults   #Afib with RVR Patient initially presented in Afib with RVR but spontaneously converted and remains in normal sinus rhythm. She did have a sinus pause of 5.38 seconds after receiving a dose of IV lopressor, and then this was discontinued. We will start low-dose metoprolol today per cards. We will continue to monitor on telemetry, and if she has recurrent pauses on metoprolol, we will talk to the cards team about next steps.  - Continue cardiac monitoring - Cardiology consulted - Metoprolol 12.5 mg p.o. twice daily   #Type 2 diabetes mellitus #Autonomic dysfunction  #Diabetic neuropathy The patient is on levemir 15u qhs, novolog 3u TID with meals, metformin 1000 mg BID, and tradjenta 5 mg daily at home for her diabetes. A1c 12.1 (was 13.5, 2 months ago). She also has chronic numbness/tingling in her fingers and toes from her diabetic neuropathy and is on gabapentin 300 mg TID.  - Levemir 7u qhs with SSI  - CBG monitoring q4h   #UTI Patient denies any dysuria, urinary frequency/urgency, hematuria, flank pain, or any other symptoms at this time. UA on 10/11 showed large leukocytes, >  50 WBC, and some budding yeast present. Urine culture grew multiple species. She was started on Keflex at that time. - Rocephin 1 g q24h (last day 10/18) and Diflucan IVPB 200 mg (last day 10/22).   #Anemia of chronic disease  Hb remains stable at 10.9, baseline around 12. B12 and folate normal. Iron and ferritin also within normal  limits, although TIBC low.   - Continue to monitor CBC   #Hypertension - Norvasc 2.5 mg BID and losartan 25 mg daily - Continue to monitor BP, may need midodrine in the future if hypotension recurs    #Hyperlipidemia - Continue Lipitor 40 mg daily   #Glaucoma, left eye - Continue cosopt eye drops BID   Prior to Admission Living Arrangement: Home Anticipated Discharge Location: Pending PT/OT evals Barriers to Discharge: PT/OT evals Dispo: Anticipated discharge pending PT/OT evals  Orvis Brill, MD 02/05/2021, 1:08 PM Pager: [phone]  After 5pm on weekdays and 1pm on weekends: On Call pager [phone]

## 2021-02-05 NOTE — Progress Notes (Signed)
ANTICOAGULATION CONSULT NOTE - Follow Up Consult  Pharmacy Consult for heparin Indication:  Afib/NSTEMI  Labs: Recent Labs    02/03/21 2212 02/04/21 0013 02/04/21 0215 02/04/21 0506 02/04/21 0830 02/04/21 1130 02/04/21 2043 02/05/21 0306  HGB  --   --  9.9*  --   --   --  10.9* 9.7*  HCT  --   --  29.9*  --   --   --  33.7* 30.7*  PLT  --   --  146*  --   --   --  164 147*  APTT  --   --   --  29  --   --  38* 46*  LABPROT  --   --   --  15.2  --   --   --   --   INR  --   --   --  1.2  --   --   --   --   HEPARINUNFRC  --   --   --  >1.10*  --   --   --  0.74*  CREATININE 1.14*  --  1.08*  --   --   --  0.88  --   CKTOTAL 116  --   --   --   --   --   --   --   TROPONINIHS 1,611*   < > 2,170*  --  2,176* 1,507*  --   --    < > = values in this interval not displayed.    Assessment: 50yo female remains subtherapeutic on heparin after rate change; no infusion issues or signs of bleeding per RN.  Goal of Therapy:  aPTT 66-102 seconds   Plan:  Will increase heparin infusion by 3 units/kg/hr to 1300 units/hr and check PTT in 6 hours.    Wynona Neat, PharmD, BCPS  02/05/2021,4:43 AM

## 2021-02-05 NOTE — Progress Notes (Signed)
PT Cancellation Note  Patient Details Name: Jennifer [name] MRN: [id number] DOB: 11/06/1970   Cancelled Treatment:    Reason Eval/Treat Not Completed: Medical issues which prohibited therapy.  Bedrest for four hours at least, retry at another time.   Ramond Dial 02/05/2021, 1:39 PM  Mee Hives, PT PhD Acute Rehab Dept. Number: [place] and [place]

## 2021-02-05 NOTE — Care Management (Signed)
1229 02-05-21 Case Manager received a consult for medication assistance. Patient has Medicaid- Rx's cost should be no more than $4.00. Case Manager will not be able to assist with co pays.

## 2021-02-05 NOTE — Plan of Care (Signed)
?  Problem: Clinical Measurements: ?Goal: Will remain free from infection ?Outcome: Progressing ?Goal: Diagnostic test results will improve ?Outcome: Progressing ?Goal: Respiratory complications will improve ?Outcome: Progressing ?  ?

## 2021-02-05 NOTE — Progress Notes (Signed)
PT Cancellation Note  Patient Details Name: Jennifer [name] MRN: [id number] DOB: 12-24-1970   Cancelled Treatment:    Reason Eval/Treat Not Completed: Patient at procedure or test/unavailable.  Cath lab appt, retry as time and pt allow.   Ramond Dial 02/05/2021, 9:56 AM  Mee Hives, PT PhD Acute Rehab Dept. Number: [place] and [place]

## 2021-02-05 NOTE — Progress Notes (Signed)
SITE AREA: right groin/femoral  SITE PRIOR TO REMOVAL:  LEVEL 0  PRESSURE APPLIED FOR: approximately 20 minutes  MANUAL: yes  PATIENT STATUS DURING PULL: stable  POST PULL SITE:  LEVEL 0  POST PULL INSTRUCTIONS GIVEN: yes  POST PULL PULSES PRESENT: right pedal pulse dopplerable, left pedal pulse +2  DRESSING APPLIED: gauze with tegaderm  BEDREST BEGINS @ 1038  COMMENTS:  daughter at bedside for translation

## 2021-02-05 NOTE — Progress Notes (Signed)
OT Cancellation Note  Patient Details Name: Ragna Kramlich MRN: [id number] DOB: 09-Mar-1971   Cancelled Treatment:    Reason Eval/Treat Not Completed: Patient at procedure or test/ unavailable. Pt presenting for cardiac cath today. Will follow up as able.  Helane Gunther, OT/L  Acute Rehab [place] 02/05/2021, 12:01 PM

## 2021-02-05 NOTE — Interval H&P Note (Signed)
History and Physical Interval Note:  02/05/2021 9:04 AM  Jennifer [name]  has presented today for surgery, with the diagnosis of chest pain.  The various methods of treatment have been discussed with the patient and family. After consideration of risks, benefits and other options for treatment, the patient has consented to  Procedure(s): LEFT HEART CATH AND CORONARY ANGIOGRAPHY (N/A) as a surgical intervention.  The patient's history has been reviewed, patient examined, no change in status, stable for surgery.  I have reviewed the patient's chart and labs.  Questions were answered to the patient's satisfaction.     Birdie Riddle

## 2021-02-06 ENCOUNTER — Other Ambulatory Visit: Payer: Self-pay | Admitting: Internal Medicine

## 2021-02-06 DIAGNOSIS — I214 Non-ST elevation (NSTEMI) myocardial infarction: Secondary | ICD-10-CM | POA: Diagnosis present

## 2021-02-06 LAB — GLUCOSE, CAPILLARY
Glucose-Capillary: 176 mg/dL — ABNORMAL HIGH (ref 70–99)
Glucose-Capillary: 188 mg/dL — ABNORMAL HIGH (ref 70–99)
Glucose-Capillary: 208 mg/dL — ABNORMAL HIGH (ref 70–99)
Glucose-Capillary: 242 mg/dL — ABNORMAL HIGH (ref 70–99)
Glucose-Capillary: 310 mg/dL — ABNORMAL HIGH (ref 70–99)

## 2021-02-06 LAB — CBC
HCT: 28.3 % — ABNORMAL LOW (ref 36.0–46.0)
Hemoglobin: 8.9 g/dL — ABNORMAL LOW (ref 12.0–15.0)
MCH: 28 pg (ref 26.0–34.0)
MCHC: 31.4 g/dL (ref 30.0–36.0)
MCV: 89 fL (ref 80.0–100.0)
Platelets: 101 10*3/uL — ABNORMAL LOW (ref 150–400)
RBC: 3.18 MIL/uL — ABNORMAL LOW (ref 3.87–5.11)
RDW: 12.5 % (ref 11.5–15.5)
WBC: 5 10*3/uL (ref 4.0–10.5)
nRBC: 0 % (ref 0.0–0.2)

## 2021-02-06 LAB — BASIC METABOLIC PANEL
Anion gap: 10 (ref 5–15)
BUN: 27 mg/dL — ABNORMAL HIGH (ref 6–20)
CO2: 14 mmol/L — ABNORMAL LOW (ref 22–32)
Calcium: 8.3 mg/dL — ABNORMAL LOW (ref 8.9–10.3)
Chloride: 114 mmol/L — ABNORMAL HIGH (ref 98–111)
Creatinine, Ser: 0.94 mg/dL (ref 0.44–1.00)
GFR, Estimated: >60 mL/min (ref >60)
Glucose, Bld: 257 mg/dL — ABNORMAL HIGH (ref 70–99)
Potassium: 4.1 mmol/L (ref 3.5–5.1)
Sodium: 138 mmol/L (ref 135–145)

## 2021-02-06 LAB — MRSA NEXT GEN BY PCR, NASAL: MRSA by PCR Next Gen: NOT DETECTED

## 2021-02-06 MED ORDER — LOSARTAN POTASSIUM 25 MG PO TABS: 25.0000 mg | ORAL_TABLET | Freq: Every day | ORAL | 0 refills | Status: DC

## 2021-02-06 MED ORDER — FLUCONAZOLE 100 MG PO TABS: 200.0000 mg | ORAL_TABLET | Freq: Every day | ORAL | 0 refills | Status: DC

## 2021-02-06 MED ORDER — APIXABAN 5 MG PO TABS
5.0000 mg | ORAL_TABLET | Freq: Two times a day (BID) | ORAL | Status: DC
  Administered 2021-02-06: 5 mg via ORAL
  Filled 2021-02-06: qty 1

## 2021-02-06 MED ORDER — AMLODIPINE BESYLATE 2.5 MG PO TABS: 2.5000 mg | ORAL_TABLET | Freq: Two times a day (BID) | ORAL | 0 refills | Status: DC

## 2021-02-06 MED ORDER — ENSURE ENLIVE PO LIQD: 237.0000 mL | Freq: Two times a day (BID) | ORAL | 12 refills | Status: DC

## 2021-02-06 MED ORDER — METOPROLOL TARTRATE 25 MG PO TABS: 12.5000 mg | ORAL_TABLET | Freq: Two times a day (BID) | ORAL | 0 refills | Status: DC

## 2021-02-06 NOTE — Care Management (Signed)
1152 02-06-21 Ambulatory referral submitted for outpatient PT evaluation and treatment to the [facility] location. Information placed on the AVS. The office will contact the patient with visit times. No further needs from Case Manager at this time.

## 2021-02-06 NOTE — Discharge Instructions (Addendum)
You were admitted to the hospital and found to have a small heart attack.  Our heart doctors saw you and are recommending medicines provided in this paperwork.  Please take these as prescribed.  You have been discharged in good condition with recommendation for home health PT and OT.  You will follow-up with the heart doctors in 1 week.  Otherwise, please also see your primary care provider early next week to discuss your recent hospital visit.  We also recommend that you discuss your blood counts with your primary care provider.  Please call your doctor for any symptoms such as worsening chest pain or shortness of breath.

## 2021-02-06 NOTE — Evaluation (Signed)
Physical Therapy Evaluation Patient Details Name: Jennifer [name] MRN: [id number] DOB: 06-30-1970 Today's Date: 02/06/2021  History of Present Illness  50 year old female who presented from [facility] for cardiac catheterization for diagnosis of non-ST elevation MI after presentation of Afib and abnormal HS troponin 1. Catheterization showed 35% stenosis of the distal LAD, recommendations for medical management with beta-blocker, ARB, and atorvastatin. PMH: chronic diastolic heart failure, insulin-dependent type 2 diabetes, A. fib on Eliquis, hyperlipidemia, glaucoma with blindness in her left eye and CKD stage IIIa Right ankle fracture with surgery and residual weakness  Clinical Impression  PTA pt living with husband and 2 children in single story apartment with level entry. Pt's daughter reports her mother was able to walk in the apartment with RW, family uses wheelchair for community level ambulation. Pt is able to perform upper body dressing and requires assistance getting into the tub once sitting on chair she is able to bath herself. Family provides for cooking, cleaning, med management and transportation. Pt is currently limited in safe mobility by generalized weakness, with increased R LE weakness and decreased R ankle ROM, as well as decreased balance and endurance. Pt requires min A for bed mobility and modA for transportation and ambulation of 20 feet with RW. PT recommending PT at discharge and family requests Outpatient PT as they can provide transportation. Pt will also benefit from tub bench for safety with bathing. PT will continue to follow acutely.      Recommendations for follow up therapy are one component of a multi-disciplinary discharge planning process, led by the attending physician.  Recommendations may be updated based on patient status, additional functional criteria and insurance authorization.  Follow Up Recommendations Outpatient PT;Supervision/Assistance - 24  hour (family prefers Outpatient therapy and can provide transportation)    Equipment Recommendations  Other (comment) (tub bench)    Recommendations for Other Services OT consult     Precautions / Restrictions Precautions Precautions: Fall Restrictions Weight Bearing Restrictions: No      Mobility  Bed Mobility Overal bed mobility: Needs Assistance Bed Mobility: Supine to Sit;Sit to Supine     Supine to sit: Min assist Sit to supine: Min assist   General bed mobility comments: pt able to manage LE out of bed, requires min A for bringing trunk to upright, min A for return of LE to bed after ambulation    Transfers Overall transfer level: Needs assistance   Transfers: Sit to/from Stand Sit to Stand: Mod assist         General transfer comment: modA for power up and steadying, good initial hand placement but increased difficulty bringing R UE to RW and pt with increased posterior lean which requires modA for bringing CoG over BoS  Ambulation/Gait Ambulation/Gait assistance: Min assist;Mod assist Gait Distance (Feet): 20 Feet Assistive device: Rolling walker (2 wheeled) Gait Pattern/deviations: Step-to pattern;Decreased step length - left;Decreased stance time - right;Decreased dorsiflexion - right;Decreased weight shift to right;Shuffle;Trunk flexed Gait velocity: slowed Gait velocity interpretation: <1.31 ft/sec, indicative of household ambulator General Gait Details: min A for initial gait, decreased dorsiflexion and dragging of R foot in swing phase due to decreased R hip strength, modA required to turn around to sit back on bed as pt lets go of RW too soon to provide assist to swing hips around to in front of bed        Balance Overall balance assessment: Needs assistance Sitting-balance support: Feet supported;No upper extremity supported;Bilateral upper extremity supported;Single extremity supported Sitting balance-Leahy  Scale: Fair     Standing balance  support: Bilateral upper extremity supported;During functional activity Standing balance-Leahy Scale: Poor Standing balance comment: requires BUE and outside support to maintain balance in standing                             Pertinent Vitals/Pain Pain Assessment: 0-10 Pain Score: 7  Pain Location: headache Pain Descriptors / Indicators: Headache Pain Intervention(s): Limited activity within patient's tolerance;Monitored during session;Premedicated before session;Repositioned    Home Living Family/patient expects to be discharged to:: Private residence Living Arrangements: Spouse/significant other;Children Available Help at Discharge: Family;Available PRN/intermittently Type of Home: Apartment Home Access: Level entry     Home Layout: One level Home Equipment: Walker - 2 wheels;Wheelchair - manual;Cane - single point      Prior Function Level of Independence: Needs assistance   Gait / Transfers Assistance Needed: RW in home, family uses wheelchair in community  ADL's / Homemaking Assistance Needed: pt requires assist to get into tub and she is able to bath herself and dresses upper body, family provides for lower body dressing and  for iADLs        Hand Dominance   Dominant Hand: Right    Extremity/Trunk Assessment   Upper Extremity Assessment Upper Extremity Assessment: Defer to OT evaluation    Lower Extremity Assessment Lower Extremity Assessment: RLE deficits/detail;LLE deficits/detail RLE Deficits / Details: hip and knee ROM WFL, ankle lacking full plantar and dorsiflex due to prior ORIF, hip flex, 3/5, knee flex/ext 3+/5, ankle dorsi 2/5, plantarflex 3/5, RLE Coordination: decreased fine motor LLE Deficits / Details: ROM WFL, strength grossly    Cervical / Trunk Assessment Cervical / Trunk Assessment: Normal  Communication   Communication: Prefers language other than English (Arabic pt prefers daughter Development worker, international aid translate)  Cognition  Arousal/Alertness: Awake/alert Behavior During Therapy: WFL for tasks assessed/performed Overall Cognitive Status: Within Functional Limits for tasks assessed                                        General Comments General comments (skin integrity, edema, etc.): Daughter present during session provides interpretation and clarification of Home set up and PLOF, VSS on RA        Assessment/Plan    PT Assessment Patient needs continued PT services  PT Problem List Decreased strength;Decreased range of motion;Decreased activity tolerance;Decreased balance;Decreased mobility;Decreased coordination;Decreased safety awareness;Cardiopulmonary status limiting activity       PT Treatment Interventions DME instruction;Gait training;Functional mobility training;Therapeutic activities;Therapeutic exercise;Balance training;Cognitive remediation;Patient/family education    PT Goals (Current goals can be found in the Care Plan section)  Acute Rehab PT Goals Patient Stated Goal: walk better PT Goal Formulation: With patient/family Time For Goal Achievement: 02/20/21 Potential to Achieve Goals: Fair    Frequency Min 3X/week    AM-PAC PT "6 Clicks" Mobility  Outcome Measure Help needed turning from your back to your side while in a flat bed without using bedrails?: None Help needed moving from lying on your back to sitting on the side of a flat bed without using bedrails?: A Little Help needed moving to and from a bed to a chair (including a wheelchair)?: A Lot Help needed standing up from a chair using your arms (e.g., wheelchair or bedside chair)?: A Lot Help needed to walk in hospital room?: A Lot Help needed climbing 3-5 steps with  a railing? : Total 6 Click Score: 14    End of Session Equipment Utilized During Treatment: Gait belt Activity Tolerance: Patient tolerated treatment well Patient left: in bed;with call bell/phone within reach;with bed alarm set Nurse  Communication: Mobility status PT Visit Diagnosis: Unsteadiness on feet (R26.81);Other abnormalities of gait and mobility (R26.89);Muscle weakness (generalized) (M62.81);Difficulty in walking, not elsewhere classified (R26.2)    Time: 1032-1050 PT Time Calculation (min) (ACUTE ONLY): 18 min   Charges:   PT Evaluation $PT Eval Moderate Complexity: 1 Mod          Jennifer [name] B. Migdalia Dk PT, DPT Acute Rehabilitation Services Pager [phone] Office [phone]   [place] 02/06/2021, 11:12 AM

## 2021-02-06 NOTE — Consult Note (Signed)
Ref: Jose Persia, MD   Subjective:  Feeling better. Some discomfort in lower abdomen but non-tender on palpation. Cath site in right groin is also non-tender and without any swelling. Patient and family understood she has mild CAD and will follow diet, medications and activity. Will check heart rhythm in office on follow up. Eliquis resumed.  Small dose beta-blocker is okay.  Objective:  Vital Signs in the last 24 hours: Temp:  [98.6 F (37 C)-98.9 F (37.2 C)] 98.7 F (37.1 C) (10/18 1400) Pulse Rate:  [74-85] 75 (10/18 1400) Cardiac Rhythm: Normal sinus rhythm (10/18 0846) Resp:  [16-20] 16 (10/18 1400) BP: (115-140)/(55-71) 133/65 (10/18 1400) SpO2:  [96 %-99 %] 97 % (10/18 1400)  Physical Exam: BP Readings from Last 1 Encounters:  02/06/21 133/65     Wt Readings from Last 1 Encounters:  02/05/21 74 kg    Weight change:  Body mass index is 26.33 kg/m. 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 Lungs:  Clear, Bilateral. Cardiac:  Regular rhythm, normal S1 and S2, no S3. II/VI systolic murmur. Abdomen:  Soft, non-tender. BS present. Extremities:  No edema present. No cyanosis. No clubbing. Right groin cath site is normal. CNS: AxOx3, Cranial nerves grossly intact, moves all 4 extremities.  Skin: Warm and dry.   Intake/Output from previous day: 10/17 0701 - 10/18 0700 In: 1194 [P.O.:240; I.V.:1100] Out: 0     Lab Results: BMET    Component Value Date/Time   NA 138 02/06/2021 0206   NA 139 02/04/2021 2043   NA 138 02/04/2021 0215   NA 139 04/19/2020 1555   NA 142 03/27/2020 1443   NA 135 03/03/2019 1533   K 4.1 02/06/2021 0206   K 3.8 02/04/2021 2043   K 3.6 02/04/2021 0215   CL 114 (H) 02/06/2021 0206   CL 111 02/04/2021 2043   CL 106 02/04/2021 0215   CO2 14 (L) 02/06/2021 0206   CO2 19 (L) 02/04/2021 2043   CO2 25 02/04/2021 0215   GLUCOSE  257 (H) 02/06/2021 0206   GLUCOSE 146 (H) 02/04/2021 2043   GLUCOSE 344 (H) 02/04/2021 0215   BUN 27 (H) 02/06/2021 0206   BUN 27 (H) 02/04/2021 2043   BUN 34 (H) 02/04/2021 0215   BUN 14 04/19/2020 1555   BUN 14 03/27/2020 1443   BUN 23 03/03/2019 1533   CREATININE 0.94 02/06/2021 0206   CREATININE 0.88 02/04/2021 2043   CREATININE 1.08 (H) 02/04/2021 0215   CREATININE 0.63 10/28/2014 1649   CREATININE 0.53 02/18/2014 1653   CREATININE 0.61 07/07/2012 1601   CALCIUM 8.3 (L) 02/06/2021 0206   CALCIUM 9.0 02/04/2021 2043   CALCIUM 8.8 (L) 02/04/2021 0215   GFRNONAA >60 02/06/2021 0206   GFRNONAA >60 02/04/2021 2043   GFRNONAA >60 02/04/2021 0215   GFRNONAA >89 10/28/2014 1649   GFRNONAA >89 02/18/2014 1653   GFRNONAA >89 07/07/2012 1601   GFRAA 75 04/19/2020 1555   GFRAA 85 03/27/2020 1443   GFRAA 88 03/03/2019 1533   GFRAA >89 10/28/2014 1649   GFRAA >89 02/18/2014 1653   GFRAA >89 07/07/2012 1601   CBC    Component Value Date/Time   WBC 5.0 02/06/2021 0456   RBC 3.18 (L) 02/06/2021 0456   HGB 8.9 (L) 02/06/2021 0456   HGB 8.9 (L) 04/19/2020 1555   HCT 28.3 (L) 02/06/2021 0456   HCT 29.8 (L) 04/19/2020 1555   PLT 101 (  L) 02/06/2021 0456   PLT 262 04/19/2020 1555   MCV 89.0 02/06/2021 0456   MCV 84 04/19/2020 1555   MCH 28.0 02/06/2021 0456   MCHC 31.4 02/06/2021 0456   RDW 12.5 02/06/2021 0456   RDW 13.2 04/19/2020 1555   LYMPHSABS 3.4 02/04/2021 2043   LYMPHSABS 2.2 04/19/2020 1555   MONOABS 0.3 02/04/2021 2043   EOSABS 0.1 02/04/2021 2043   EOSABS 0.2 04/19/2020 1555   BASOSABS 0.0 02/04/2021 2043   BASOSABS 0.0 04/19/2020 1555   HEPATIC Function Panel Recent Labs    02/03/21 1547 02/04/21 0215 02/04/21 2043  PROT 7.8 6.2* 6.3*   HEMOGLOBIN A1C No components found for: HGA1C,  MPG CARDIAC ENZYMES Lab Results  Component Value Date   CKTOTAL 116 02/03/2021   BNP No results for input(s): PROBNP in the last 8760 hours. TSH Recent Labs     05/15/20 2151 11/25/20 1212 02/04/21 0215  TSH 1.880 3.115 1.284   CHOLESTEROL Recent Labs    02/04/21 0215  CHOL 212*    Scheduled Meds:  amLODipine  2.5 mg Oral BID   apixaban  5 mg Oral BID   atorvastatin  40 mg Oral Daily   brimonidine  1 drop Left Eye Q8H   dorzolamide-timolol  1 drop Left Eye BID   feeding supplement  237 mL Oral BID BM   insulin aspart  0-6 Units Subcutaneous Q4H   insulin detemir  7 Units Subcutaneous QHS   losartan  25 mg Oral Daily   metoprolol tartrate  12.5 mg Oral BID   sodium chloride flush  3 mL Intravenous Q12H   Continuous Infusions:  sodium chloride 100 mL/hr at 02/06/21 0322   sodium chloride     cefTRIAXone (ROCEPHIN)  IV 1 g (02/05/21 1641)   fluconazole (DIFLUCAN) IV 200 mg (02/06/21 1002)   PRN Meds:.sodium chloride, acetaminophen **OR** acetaminophen, metoCLOPramide, ondansetron (ZOFRAN) IV, sodium chloride flush  Assessment/Plan:  NSTEMI HFpEF A.fib, paroxysmal,  Type 2 DM with neuropathy Anemia of chronic disease HTN HLD Glaucoma  Plan:  Continue medical treatment. F/U in 1 week.   LOS: 2 days   Time spent including chart review, lab review, examination, discussion with patient /Family/Nurse: 30 min   Dixie Dials  MD  02/06/2021, 3:41 PM

## 2021-02-06 NOTE — Evaluation (Signed)
Occupational Therapy Evaluation Patient Details Name: Jennifer [name] MRN: [id number] DOB: 1970-09-05 Today's Date: 02/06/2021   History of Present Illness 50 year old female who presented from [facility] for cardiac catheterization for diagnosis of non-ST elevation MI after presentation of Afib and abnormal HS troponin 1. Catheterization showed 35% stenosis of the distal LAD, recommendations for medical management with beta-blocker, ARB, and atorvastatin. PMH: chronic diastolic heart failure, insulin-dependent type 2 diabetes, A. 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 Was seen for OT to assess for needs. Pt. Has decreased I and safety with ADLs and ADL transfers. Pt. Has had assist at home for tub transfers but per dtr she was able to preform bathing without assist. Pt. Was able to dress upper body but was mod a with LE dressing. Pt. Has decreased rom of L shld and decreased flexability in LE. Pt. Is blind in L eye from glaucoma. Pt. Has decreased balance. Pt. To be followed by OT acutely.      Recommendations for follow up therapy are one component of a multi-disciplinary discharge planning process, led by the attending physician.  Recommendations may be updated based on patient status, additional functional criteria and insurance authorization.   Follow Up Recommendations  Home health OT    Equipment Recommendations  Tub/shower bench    Recommendations for Other Services       Precautions / Restrictions Precautions Precautions: Fall Precaution Comments: Pt. reports 2 falls in the pst 6 months. Restrictions Weight Bearing Restrictions: No      Mobility Bed Mobility         Supine to sit: Min assist Sit to supine: Min assist        Transfers Overall transfer level: Needs assistance   Transfers: Stand Pivot Transfers Sit to  Stand: Mod assist Stand pivot transfers: Mod assist            Balance     Sitting balance-Leahy Scale: Fair       Standing balance-Leahy Scale: Poor                             ADL either performed or assessed with clinical judgement   ADL Overall ADL's : Needs assistance/impaired Eating/Feeding: Independent   Grooming: Wash/dry hands;Wash/dry face;Set up;Sitting   Upper Body Bathing: Sitting;Minimal assistance   Lower Body Bathing: Moderate assistance;Sit to/from stand   Upper Body Dressing : Moderate assistance;Sitting   Lower Body Dressing: Maximal assistance;Sit to/from stand   Toilet Transfer: Moderate assistance;RW;BSC   Toileting- Clothing Manipulation and Hygiene: Moderate assistance;Sit to/from stand       Functional mobility during ADLs: Moderate assistance;Rolling walker General ADL Comments: Pt. was able to sit eob for ADLs without LOB.     Vision Baseline Vision/History: 3 Glaucoma (blind in l eye) Ability to See in Adequate Light: 0 Adequate (pt r eye is intact.) Patient Visual Report: No change from baseline Vision Assessment?:  (Pt. is blind from glaucoma in L eye)     Perception     Praxis      Pertinent Vitals/Pain Pain Assessment: 0-10 Pain Score: 5  Pain Location: headache Pain Descriptors / Indicators: Headache Pain Intervention(s): Limited activity within patient's tolerance (Pt. states she does not want pain medicine.)     Hand Dominance Right   Extremity/Trunk Assessment Upper Extremity Assessment Upper  Extremity Assessment: Generalized weakness;LUE deficits/detail LUE Deficits / Details: per dtr she thinks she has an old rotator cuff injury LUE Sensation:  (pt. reports numbness at times in her hands)   Lower Extremity Assessment Lower Extremity Assessment: Defer to PT evaluation       Communication Communication Communication: Prefers language other than English   Cognition Arousal/Alertness:  Awake/alert Behavior During Therapy: WFL for tasks assessed/performed Overall Cognitive Status: Within Functional Limits for tasks assessed                                     General Comments  daughter traslated.    Exercises     Shoulder Instructions      Home Living Family/patient expects to be discharged to:: Private residence Living Arrangements: Spouse/significant other;Children Available Help at Discharge: Family;Available PRN/intermittently Type of Home: Apartment Home Access: Level entry     Home Layout: One level     Bathroom Shower/Tub: Teacher, early years/pre: Handicapped height Bathroom Accessibility: Yes   Home Equipment: Environmental consultant - 2 wheels;Wheelchair - manual;Cane - single point          Prior Functioning/Environment Level of Independence: Needs assistance  Gait / Transfers Assistance Needed: RW in home, family uses wheelchair in community ADL's / Homemaking Assistance Needed: pt requires assist to get into tub and she is able to bath herself and dresses upper body, family provides for lower body dressing and  for iADLs Communication / Swallowing Assistance Needed: speaks Arabic          OT Problem List: Decreased range of motion;Decreased activity tolerance;Impaired balance (sitting and/or standing);Decreased knowledge of use of DME or AE;Decreased knowledge of precautions      OT Treatment/Interventions: Self-care/ADL training;DME and/or AE instruction;Therapeutic activities;Patient/family education    OT Goals(Current goals can be found in the care plan section) Acute Rehab OT Goals Patient Stated Goal: get stronger OT Goal Formulation: With patient/family Time For Goal Achievement: 02/20/21 Potential to Achieve Goals: Good  OT Frequency: Min 2X/week   Barriers to D/C:            Co-evaluation              AM-PAC OT "6 Clicks" Daily Activity     Outcome Measure Help from another person eating meals?:  None Help from another person taking care of personal grooming?: A Little Help from another person toileting, which includes using toliet, bedpan, or urinal?: A Lot Help from another person bathing (including washing, rinsing, drying)?: A Lot Help from another person to put on and taking off regular upper body clothing?: A Little Help from another person to put on and taking off regular lower body clothing?: A Lot 6 Click Score: 16   End of Session Equipment Utilized During Treatment: Surveyor, mining Communication:  (ok therapy)  Activity Tolerance: Patient tolerated treatment well Patient left: in bed;with call bell/phone within reach;with bed alarm set;with family/visitor present  OT Visit Diagnosis: Unsteadiness on feet (R26.81);Repeated falls (R29.6)                Time: 7408-1448 OT Time Calculation (min): 38 min Charges:  OT General Charges $OT Visit: 1 Visit OT Evaluation $OT Eval Moderate Complexity: 1 Mod OT Treatments $Self Care/Home Management : 8-22 mins  {Jennifer [name] OT/L   Jennifer [name] 02/06/2021, 2:00 PM

## 2021-02-06 NOTE — Progress Notes (Signed)
ANTICOAGULATION CONSULT NOTE - Initial Consult  Pharmacy Consult for apixaban Indication: afib  No Known Allergies  Patient Measurements: Height: '5\' 6"'  (167.6 cm) Weight: 74 kg (163 lb 2.3 oz) IBW/kg (Calculated) : 59.3 HEPARIN DW (KG): 72.2   Vital Signs: Temp: 98.6 F (37 C) (10/18 0458) Temp Source: Oral (10/18 0458) BP: 140/71 (10/18 0842) Pulse Rate: 78 (10/18 0842)  Labs: Recent Labs    02/03/21 2212 02/04/21 0013 02/04/21 0215 02/04/21 0506 02/04/21 0830 02/04/21 1130 02/04/21 2043 02/05/21 0306 02/06/21 0206 02/06/21 0456  HGB  --   --  9.9*  --   --   --  10.9* 9.7*  --  8.9*  HCT  --   --  29.9*  --   --   --  33.7* 30.7*  --  28.3*  PLT  --   --  146*  --   --   --  164 147*  --  101*  APTT  --   --   --  29  --   --  38* 46*  --   --   LABPROT  --   --   --  15.2  --   --   --   --   --   --   INR  --   --   --  1.2  --   --   --   --   --   --   HEPARINUNFRC  --   --   --  >1.10*  --   --   --  0.74*  --   --   CREATININE 1.14*  --  1.08*  --   --   --  0.88  --  0.94  --   CKTOTAL 116  --   --   --   --   --   --   --   --   --   TROPONINIHS 1,611*   < > 2,170*  --  2,176* 1,507*  --   --   --   --    < > = values in this interval not displayed.     Estimated Creatinine Clearance: 74.5 mL/min (by C-G formula based on SCr of 0.94 mg/dL).   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  She has not seen a dentist in about one year.  Dentist is on [address].  No fevers chills or systemic symptoms.  Diabetes has been poorly controlled for some time.  She said it has improved recently averaging around 140.  Called the dentist said they would not see patients until May 14th but the urgency o   Pap smear  abnormality of cervix with LGSIL    Routine/ritual circumcision    Type II diabetes mellitus (HCC)    Uveitis     Medications:  Medications Prior to Admission  Medication Sig Dispense Refill Last Dose   apixaban (ELIQUIS) 5 MG TABS tablet Take 1 tablet (5 mg total) by mouth 2 (two) times daily. 180 tablet 0 02/02/2021 at 2400   atorvastatin (LIPITOR) 40 MG tablet Take 1 tablet (40 mg total) by mouth daily. 90 tablet 3 02/02/2021   brimonidine (ALPHAGAN) 0.2 % ophthalmic solution Place 1 drop into the left eye every 8 (eight) hours.   02/02/2021   calcium citrate-vitamin D (CITRACAL+D) 315-200 MG-UNIT tablet Take 2 tablets by mouth 2 (two) times daily. 360 tablet 3 Past Week   [EXPIRED] cephALEXin (KEFLEX) 500 MG capsule Take 1 capsule (500 mg total) by mouth 4 (four) times daily for 5 days. 20 capsule 0 02/02/2021   diclofenac Sodium (VOLTAREN) 1 % GEL Apply 2 g topically 4 (four) times daily as needed (foot pain).   Past Week   dorzolamide-timolol (COSOPT) 22.3-6.8 MG/ML ophthalmic solution Place 1 drop into the left eye 2 (two) times daily.   Past Week   gabapentin (NEURONTIN) 300 MG capsule Take 1 capsule (300 mg total) by mouth 3 (three) times daily. 270 capsule 3 02/02/2021   insulin aspart (NOVOLOG) 100 UNIT/ML FlexPen Inject 3 Units into the skin 3 (three) times daily with meals. 80 mL 0 02/02/2021   insulin detemir (LEVEMIR) 100 UNIT/ML FlexPen Inject 15 Units into the  skin at bedtime 15 mL 0 02/02/2021   linagliptin (TRADJENTA) 5 MG TABS tablet Take 1 tablet (5 mg total) by mouth daily. 90 tablet 0 Past Week   metFORMIN (GLUCOPHAGE-XR) 500 MG 24 hr tablet TAKE 2 TABLETS (1,000 MG TOTAL) BY MOUTH IN THE MORNING AND AT BEDTIME. 360 tablet 1 Past Week   Accu-Chek FastClix Lancets MISC Check blood sugar 4 times a day 200 each 7    ACCU-CHEK GUIDE test strip CHECK BLOOD SUGAR 4 TIMES PER DAY 150 strip 12    Blood Glucose Monitoring Suppl (ACCU-CHEK GUIDE) w/Device KIT 1 each by Does not apply route 4 (four) times daily. 1 kit 1    Insulin Pen Needle (B-D UF III MINI PEN NEEDLES) 31G X 5 MM MISC Use pen needle daily for injections 100 each 2    Insulin Pen Needle (PENTIPS) 32G X 4 MM MISC Use as directed with insulin pen 100 each 3    metoCLOPramide (REGLAN) 5 MG tablet Take 1 tablet (5 mg total) by mouth 4 (four) times daily -  before meals and at bedtime. 120 tablet 0    Infusions:   sodium chloride 100 mL/hr at 02/06/21 0322   sodium chloride     cefTRIAXone (ROCEPHIN)  IV 1 g (02/05/21 1641)   fluconazole (DIFLUCAN) IV 200 mg (02/06/21 1002)    Assessment: 55 yoF on apixaban for Afib.  Admitted with elevated troponin and symptomatic Afib. He was on heparin but this was discontinued after cardiac cath. Plans are to resume apixaban -hg= 8.9, plt= 101 (history of low plt) -SCr= 0.9  Goal of Therapy:  Heparin level 0.3-0.7 units/ml aPTT 66-102 seconds Monitor platelets by anticoagulation protocol: Yes   Plan:  -restart apixaban 58m po bid  AHildred Laser PharmD Clinical Pharmacist **Pharmacist phone directory can now be found on [email or web address] ([email or web address]).  Listed under [place]

## 2021-02-06 NOTE — Progress Notes (Signed)
Inpatient Diabetes Program Recommendations  AACE/ADA: New Consensus Statement on Inpatient Glycemic Control (2015)  Target Ranges:  Prepandial:   less than 140 mg/dL      Peak postprandial:   less than 180 mg/dL (1-2 hours)      Critically ill patients:  140 - 180 mg/dL   Lab Results  Component Value Date   GLUCAP 176 (H) 02/06/2021   HGBA1C 12.1 (H) 02/03/2021    Review of Glycemic Control Results for [name], Jennifer (MRN [id number]) as of 02/06/2021 12:21  Ref. Range 02/05/2021 07:56 02/05/2021 10:17 02/05/2021 12:10 02/05/2021 16:12 02/05/2021 20:11 02/06/2021 00:39 02/06/2021 05:01 02/06/2021 07:36  Glucose-Capillary Latest Ref Range: 70 - 99 mg/dL 141 (H) 122 (H) 143 (H) 243 (H) 334 (H) 242 (H) 188 (H) 176 (H)   Diabetes history: DM 2 Outpatient Diabetes medications:  Novolog 3 units tid with meals , Levemir 15 units q HS, Tradjenta 5 mg daily, Metformin 1000 mg bid Current orders for Inpatient glycemic control:  Novolog 0-6 units q 4 hours, Levemir 7 units q HS  Inpatient Diabetes Program Recommendations:   Please consider: -Add Novolog 2 units tid meal coverage if eats 50% Secure chat sent to Dr. Alberteen Sam.  Thank you, Jennifer [name]. Jennifer Syring, RN, MSN, CDE  Diabetes Coordinator Inpatient Glycemic Control Team Team Pager [phone] (8am-5pm) 02/06/2021 12:21 PM

## 2021-02-06 NOTE — Plan of Care (Signed)
  Problem: Education: Goal: Knowledge of General Education information will improve Description: Including pain rating scale, medication(s)/side effects and non-pharmacologic comfort measures Outcome: Adequate for Discharge   

## 2021-02-06 NOTE — Progress Notes (Signed)
Discharge instructions (including medications) discussed with and copy provided to patient/caregiver 

## 2021-02-06 NOTE — Discharge Summary (Signed)
Name: Jennifer [name] MRN: [id number] DOB: September 21, 1970 50 y.o. PCP: Jennifer Persia, MD  Date of Admission: 02/03/2021  1:08 PM Date of Discharge: 02/06/2021 Attending Physician: Jennifer Contes, MD  Discharge Diagnosis: 1.  NSTEMI, atypical presentation 2.  HFpEF 3.  A. fib with RVR 4.  Type 2 diabetes, diabetic neuropathy 5.  UTI 6.  Anemia of chronic disease, CKD stage III 7.  Hypertension 8.  Hyperlipidemia 9.  Glaucoma  Discharge Medications: Allergies as of 02/06/2021   No Known Allergies      Medication List     STOP taking these medications    cephALEXin 500 MG capsule Commonly known as: KEFLEX       TAKE these medications    Accu-Chek FastClix Lancets Misc Check blood sugar 4 times a day   Accu-Chek Guide test strip Generic drug: glucose blood CHECK BLOOD SUGAR 4 TIMES PER DAY   Accu-Chek Guide w/Device Kit 1 each by Does not apply route 4 (four) times daily.   amLODipine 2.5 MG tablet Commonly known as: NORVASC Take 1 tablet (2.5 mg total) by mouth 2 (two) times daily.   apixaban 5 MG Tabs tablet Commonly known as: ELIQUIS Take 1 tablet (5 mg total) by mouth 2 (two) times daily.   atorvastatin 40 MG tablet Commonly known as: LIPITOR Take 1 tablet (40 mg total) by mouth daily.   B-D UF III MINI PEN NEEDLES 31G X 5 MM Misc Generic drug: Insulin Pen Needle Use pen needle daily for injections   Pentips 32G X 4 MM Misc Generic drug: Insulin Pen Needle Use as directed with insulin pen   brimonidine 0.2 % ophthalmic solution Commonly known as: ALPHAGAN Place 1 drop into the left eye every 8 (eight) hours.   calcium citrate-vitamin D 315-200 MG-UNIT tablet Commonly known as: CITRACAL+D Take 2 tablets by mouth 2 (two) times daily.   diclofenac Sodium 1 % Gel Commonly known as: VOLTAREN Apply 2 g topically 4 (four) times daily as needed (foot pain).   dorzolamide-timolol 22.3-6.8 MG/ML ophthalmic solution Commonly known as:  COSOPT Place 1 drop into the left eye 2 (two) times daily.   feeding supplement Liqd Take 237 mLs by mouth 2 (two) times daily between meals.   fluconazole 100 MG tablet Commonly known as: Diflucan Take 2 tablets (200 mg total) by mouth daily for 5 days.   gabapentin 300 MG capsule Commonly known as: Neurontin Take 1 capsule (300 mg total) by mouth 3 (three) times daily.   insulin aspart 100 UNIT/ML FlexPen Commonly known as: NOVOLOG Inject 3 Units into the skin 3 (three) times daily with meals.   insulin detemir 100 UNIT/ML FlexPen Commonly known as: LEVEMIR Inject 15 Units into the skin at bedtime   linagliptin 5 MG Tabs tablet Commonly known as: TRADJENTA Take 1 tablet (5 mg total) by mouth daily.   losartan 25 MG tablet Commonly known as: COZAAR Take 1 tablet (25 mg total) by mouth daily. Start taking on: February 07, 2021   metFORMIN 500 MG 24 hr tablet Commonly known as: GLUCOPHAGE-XR TAKE 2 TABLETS (1,000 MG TOTAL) BY MOUTH IN THE MORNING AND AT BEDTIME.   metoCLOPramide 5 MG tablet Commonly known as: Reglan Take 1 tablet (5 mg total) by mouth 4 (four) times daily -  before meals and at bedtime.   metoprolol tartrate 25 MG tablet Commonly known as: LOPRESSOR Take 0.5 tablets (12.5 mg total) by mouth 2 (two) times daily.        Disposition  and follow-up:   [name] was discharged from [facility] in Stable condition.  At the hospital follow up visit please address:  1.  Anemia of chronic disease: On discharge, hemoglobin was 8.9, baseline around 12.  Platelets noted to be decreased to 101 at discharge.  Discussed with patient and her daughter the need to work this up further in the outpatient setting.  NSTEMI: Cardiology is recommending medical therapy with beta-blocker, ARB, statin.  Patient will follow-up with cardiology in 1 week.  See below for more details.  PT/OT recommending home PT/OT.  UTI: Patient has finished Rocephin  while in the hospital.  She will continue Diflucan regimen (last day 10/22).  2.  Labs / imaging needed at time of follow-up: CBC  3.  Pending labs/ test needing follow-up: None  Follow-up Appointments:  Follow-up Information     [facility] Follow up.   Specialty: Rehabilitation Why: Outpatient Physical Therapy-office to call with visit times. Contact information: [address] [id number] mc [place]        Jennifer Dials, MD. Schedule an appointment as soon as possible for a visit in 1 week(s).   Specialty: Cardiology Contact information: [place] [address] [phone]                 Hospital Course by problem list: #NSTEMI, atypical presentation #HFpEF Presented presented to the ED with confusion, nausea, and vomiting but no CP or SOB. Troponins elevated troponins to 1600 and peaked at 2170. EKG and CXR were unremarkable. Echo performed on 10/16 with EF of 60-65% and grade I diastolic dysfunction, no significant changes noted when compared to prior echo from 02/2020.  She was started on heparin drip, which she continued until 10/17.  Cardiology was consulted with recommendation for cardiac catheterization, which was performed on 10/17. This showed 35% stenosis of the distal LAD, recommendations for medical management with beta-blocker, ARB, and atorvastatin.  On 10/17, she was restarted on losartan 25 mg daily, metoprolol 12.5 mg p.o. twice daily, and continued her atorvastatin regimen.  On 10/18, Eliquis 5 mg twice daily was resumed.  PT/OT evaluated the patient on 10/18 when patient was no longer on bedrest, and they are recommending home health therapies.  #Afib with RVR Patient initially presented in Afib with RVR but spontaneously converted and remains in normal sinus rhythm. She did have a sinus pause of 5.38 seconds after receiving a dose of IV lopressor, and  then this was discontinued.  We started patient on low-dose metoprolol on 10/17.  She was monitored on telemetry and had no other notable events.  #Type 2 diabetes mellitus #Autonomic dysfunction  #Diabetic neuropathy The patient is on levemir 15u qhs, novolog 3u TID with meals, metformin 1000 mg BID, and tradjenta 5 mg daily at home for her diabetes. A1c 12.1 (was 13.5, 2 months ago). She also has chronic numbness/tingling in her fingers and toes from her diabetic neuropathy and is on gabapentin 300 mg TID.  During her hospitalization, she was on Levemir 7 units nightly with SSI and sugars were monitored every 4 hours. Sugars were well-controlled during her hospitalization.  #UTI Patient denies any dysuria, urinary frequency/urgency, hematuria, flank pain, or any other symptoms at this time. UA on 10/11 showed large leukocytes, >50 WBC, and some budding yeast present. Urine culture grew multiple species. She was started on Keflex at that time. She was switched to Rocephin on 10/15, and this was  given for 3 full days in the hospital.  Pharmacy was consulted and recommended a 7-day course of Diflucan, which she will continue after discharge (last day 10/22).  #Anemia of chronic disease, CKD stage III Patient has been diagnosed with CKD stage III although most recently creatinine has been in normal range around 0.8-1. Hemoglobin 12.7->9.9->10.0->9.7->8.9, baseline around 12. B12 and folate normal. Iron and ferritin also within normal limits, although TIBC low.  Platelets noted to be in the 140s-160s for most of the hospitalization, however they decreased to 101 on discharge.  We discussed with the patient and her daughter the need for further work-up in the outpatient setting for her anemia/thrombocytopenia.  #Hypertension Patient was continued on Norvasc 2.5 mg BID and losartan 25 mg daily. Blood pressures remained well-controlled throughout her hospitalization.  #Hyperlipidemia Patient was continued  on Lipitor 40 mg daily during her hospitalization.  #Glaucoma, left eye Continued cosopt eye drops BID during her hospital stay.  Discharge Exam:   BP 133/65 (BP Location: Left Arm)   Pulse 75   Temp 98.7 F (37.1 C) (Oral)   Resp 16   Ht _0  (1.676 m)   Wt 74 kg   LMP 08/21/2016 (Exact Date)   SpO2 97%   BMI 26.33 kg/m  Discharge exam:  General: NAD, nl appearance HE: Normocephalic, atraumatic, EOMI, Conjunctivae normal ENT: No congestion, no rhinorrhea, no exudate or erythema  Cardiovascular: Normal rate, regular rhythm. No murmurs, rubs, or gallops Pulmonary: Effort normal, breath sounds normal. No wheezes, rales, or rhonchi Abdominal: soft, nontender, bowel sounds present Musculoskeletal: no swelling, deformity, injury or tenderness in extremities Skin: Warm, dry, no bruising, erythema, or rash.  Psychiatric/Behavioral: normal mood, normal behavior   Pertinent Labs, Studies, and Procedures:  CBC Latest Ref Rng & Units 02/06/2021 02/05/2021 02/04/2021  WBC 4.0 - 10.5 K/uL 5.0 5.3 7.2  Hemoglobin 12.0 - 15.0 g/dL 8.9(L) 9.7(L) 10.9(L)  Hematocrit 36.0 - 46.0 % 28.3(L) 30.7(L) 33.7(L)  Platelets 150 - 400 K/uL 101(L) 147(L) 164    BMP Latest Ref Rng & Units 02/06/2021 02/04/2021 02/04/2021  Glucose 70 - 99 mg/dL 257(H) 146(H) 344(H)  BUN 6 - 20 mg/dL 27(H) 27(H) 34(H)  Creatinine 0.44 - 1.00 mg/dL 0.94 0.88 1.08(H)  BUN/Creat Ratio 9 - 23 - - -  Sodium 135 - 145 mmol/L 138 139 138  Potassium 3.5 - 5.1 mmol/L 4.1 3.8 3.6  Chloride 98 - 111 mmol/L 114(H) 111 106  CO2 22 - 32 mmol/L 14(L) 19(L) 25  Calcium 8.9 - 10.3 mg/dL 8.3(L) 9.0 8.8(L)     Discharge Instructions: Discharge Instructions     Ambulatory referral to Physical Therapy   Complete by: As directed    Outpatient PT evaluation and treatment-   Call MD for:  difficulty breathing, headache or visual disturbances   Complete by: As directed       Signed: Orvis Brill, MD 02/06/2021, 4:08 PM    Pager: [phone]

## 2021-02-07 ENCOUNTER — Encounter: Payer: Self-pay | Admitting: Student

## 2021-02-07 ENCOUNTER — Encounter: Payer: Self-pay | Admitting: Internal Medicine

## 2021-02-07 ENCOUNTER — Ambulatory Visit (INDEPENDENT_AMBULATORY_CARE_PROVIDER_SITE_OTHER): Payer: Medicaid Other | Admitting: Student

## 2021-02-07 ENCOUNTER — Telehealth: Payer: Self-pay

## 2021-02-07 ENCOUNTER — Other Ambulatory Visit: Payer: Self-pay

## 2021-02-07 DIAGNOSIS — R600 Localized edema: Secondary | ICD-10-CM

## 2021-02-07 MED ORDER — AMLODIPINE BESYLATE 2.5 MG PO TABS: 2.5000 mg | ORAL_TABLET | Freq: Two times a day (BID) | ORAL | 0 refills | Status: DC

## 2021-02-07 MED ORDER — FUROSEMIDE 20 MG PO TABS: ORAL_TABLET | ORAL | 0 refills | Status: DC

## 2021-02-07 MED ORDER — METOPROLOL TARTRATE 25 MG PO TABS: 12.5000 mg | ORAL_TABLET | Freq: Two times a day (BID) | ORAL | 0 refills | Status: DC

## 2021-02-07 NOTE — Progress Notes (Signed)
[facility] Telephone Encounter Continuity Care Appointment  HPI:  This telephone encounter was created for Jennifer [name] on 02/07/2021 for the following purpose/cc: hospital f/u and leg swelling. Verified patient's identity via DOB and home address. Daughter present for collateral history. Phone Interpretor used for translation. Patient with history of HFpEF (EF 60-65% with G1DD) recently hospitalized for NSTEMI and Atrial fibrillation with RVR (resolved spontaneously). Her troponins peaked at 2100. ECHO showed no change from prior and cardiac catheterization showed 35% stenosis of distal LAD and some possible microvascular disease, but was otherwise normal. She is on guideline-directed medical therapy with losartan, metoprolol, and statin. Of note, patient did receive IVF during hospitalization, but was not found to be edematous on day of discharge (yesterday). Reports that since hospitalization her whole body has been swelling, especially in legs and arms. Denies any SHOB, chest pain, palpitations. States that she has urinated about 5 times thus far today. She is having trouble with ambulation due to swelling. Discussed that she received IVF during hospitalization and, given history of HFpEF, is likely unable to completely mobilize that fluid although I am reassured that she has been urinating multiple times each day and does not have any other symptoms aside from swelling.  Patient used to be on lasix 20mg  daily for history of HFpEF, but this was discontinued at some point (unable to determine when exactly). Will reinitiate therapy with lasix 20mg  daily PRN for swelling at this time (advised her to take one tablet each day for the next week, and then PRN). Also, discussed that she will need to be evaluated in person to determine extent of swelling along with obtaining labs to further evaluate current presentation (CBC, CMP, BNP). She is scheduled for an in-person visit  on Friday morning (10/21 at 10:15AM with me).   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  She has not seen a dentist in about one year.  Dentist is on [address].  No fevers chills or systemic symptoms.  Diabetes has been poorly controlled for some time.  She said it has improved recently averaging around 140.  Called the dentist said they would not see patients until May 14th but the urgency o   Pap smear abnormality of cervix with LGSIL    Routine/ritual circumcision    Type II diabetes mellitus ([place])    Uveitis      ROS:  Negative aside from that listed in HPI.   Assessment / Plan / Recommendations:  Please see A&P under problem oriented charting for assessment of the patient's acute and  chronic medical conditions.  As always, pt is advised that if symptoms worsen or new symptoms arise, they should go to an urgent care facility or to to ER for further evaluation.   Consent and Medical Decision Making:  Patient discussed with Dr. Angelia Mould This is a telephone encounter between Jennifer [name] and Virl Axe on 02/07/2021 for  hospital f/u for leg swelling. The visit was conducted with the patient located at home and Virl Axe at [facility]. The patient's identity was confirmed using their DOB and current address. The patient has consented to being evaluated through a telephone encounter and understands the associated risks (an examination cannot be done and the patient may need to come in for an appointment) / benefits (allows the patient to remain at home, decreasing exposure to coronavirus). I personally spent 15 minutes on medical discussion.

## 2021-02-07 NOTE — Assessment & Plan Note (Signed)
Patient with history of HFpEF (EF 60-65% with G1DD) recently hospitalized for NSTEMI and Atrial fibrillation with RVR (resolved spontaneously). Her troponins peaked at 2100. ECHO showed no change from prior and cardiac catheterization showed 35% stenosis of distal LAD and some possible microvascular disease, but was otherwise normal. She is on guideline-directed medical therapy with losartan, metoprolol, and statin. Of note, patient did receive IVF during hospitalization, but was not found to be edematous on day of discharge (yesterday).  Reports that since hospitalization her whole body has been swelling, especially in legs and arms. Denies any SHOB, chest pain, palpitations. States that she has urinated about 5 times thus far today. She is having trouble with ambulation due to swelling. Discussed that she received IVF during hospitalization and, given history of HFpEF, is likely unable to completely mobilize that fluid although I am reassured that she has been urinating multiple times each day and does not have any other symptoms aside from swelling.   Patient used to be on lasix 20mg  daily for history of b/l lower extremity edema in the setting of HFpEF, but this was discontinued at some point (unable to determine when exactly). Will reinitiate therapy with lasix 20mg  daily PRN for swelling/anasarca at this time (advised her to take one tablet each day for the next week, and then PRN). Also, discussed that she will need to be evaluated in person to determine extent of swelling along with obtaining labs to further evaluate current presentation (CBC, CMP, BNP), which patient and daughter are agreeable to. She is scheduled for an in-person visit on Friday morning (10/21 at 10:15AM with me).  Plan: -lasix 20mg  daily for 1 week, then prn for swelling -f/u in person on 10/21 at 10:15AM

## 2021-02-07 NOTE — Telephone Encounter (Signed)
RN spoke to Dr. Allyson Sabal who states he can see and evaluate patient this afternoon @ 1345.  Patient added to Dr. Clinton Sawyer schedule this afternoon at 1345, 70min appt  Daughter called and notified of above, instructed daughter to have patient here at 1330 and she verbalized understanding. SChaplin, RN,BSN

## 2021-02-07 NOTE — Telephone Encounter (Signed)
Received a TC from patient's daughter [facility]) who states patient was discharged from the hospital yesterday, that she had a "mild heart attack".  Daughter states she woke up this morning with BLE swelling and her fasting BS was 385.  Daughter states she gave patient her insulin and will recheck her BS later this morning.  Pt denies any SOB, no chest pain.  Daughter has elevated patients extremities on pillows.   Will route to yellow team to advise. SChaplin, RN,BSN

## 2021-02-08 ENCOUNTER — Telehealth: Payer: Self-pay

## 2021-02-08 NOTE — Telephone Encounter (Signed)
Received TC from patient's daughter, EC Bashayer.  She states they went to pick up the lasix Dr. Allyson Sabal sent to the pharmacy yesterday and they also had 2 other RX's ready for pick up.   Both RX's were for Gabapentin.  One was written Gabapentin 300mg  , 1 capsule TID and the other RX was for Gabapentin 300mg  1 capsule QHS from Dr. Marva Panda.  She states her mother has been taking the 300mg  TID which is what is indicated on her med list.  RN instructed daughter to continue the TID dosing and to bring all medications in original bottles to her appointment tomorrow at 1000 with Dr. Allyson Sabal.  Daughter verbalized understanding and confirms appt date and time.   SChaplin, RN,BSN

## 2021-02-09 ENCOUNTER — Other Ambulatory Visit: Payer: Self-pay

## 2021-02-09 ENCOUNTER — Encounter: Payer: Self-pay | Admitting: Student

## 2021-02-09 ENCOUNTER — Ambulatory Visit (INDEPENDENT_AMBULATORY_CARE_PROVIDER_SITE_OTHER): Payer: Medicaid Other | Admitting: Student

## 2021-02-09 VITALS — BP 141/73 | HR 74 | Temp 98.2°F | Resp 32 | Ht 66.0 in | Wt 153.7 lb

## 2021-02-09 DIAGNOSIS — E1143 Type 2 diabetes mellitus with diabetic autonomic (poly)neuropathy: Secondary | ICD-10-CM | POA: Diagnosis not present

## 2021-02-09 DIAGNOSIS — R600 Localized edema: Secondary | ICD-10-CM | POA: Diagnosis not present

## 2021-02-09 DIAGNOSIS — Z794 Long term (current) use of insulin: Secondary | ICD-10-CM | POA: Diagnosis not present

## 2021-02-09 DIAGNOSIS — I214 Non-ST elevation (NSTEMI) myocardial infarction: Secondary | ICD-10-CM | POA: Diagnosis not present

## 2021-02-09 DIAGNOSIS — I480 Paroxysmal atrial fibrillation: Secondary | ICD-10-CM

## 2021-02-09 DIAGNOSIS — N1832 Chronic kidney disease, stage 3b: Secondary | ICD-10-CM | POA: Diagnosis not present

## 2021-02-09 DIAGNOSIS — N390 Urinary tract infection, site not specified: Secondary | ICD-10-CM | POA: Diagnosis not present

## 2021-02-09 DIAGNOSIS — I5032 Chronic diastolic (congestive) heart failure: Secondary | ICD-10-CM

## 2021-02-09 DIAGNOSIS — E11319 Type 2 diabetes mellitus with unspecified diabetic retinopathy without macular edema: Secondary | ICD-10-CM

## 2021-02-09 DIAGNOSIS — R319 Hematuria, unspecified: Secondary | ICD-10-CM

## 2021-02-09 LAB — BRAIN NATRIURETIC PEPTIDE: B Natriuretic Peptide: 43.1 pg/mL (ref 0.0–100.0)

## 2021-02-09 MED ORDER — INSULIN DETEMIR 100 UNIT/ML FLEXPEN: 15.0000 [IU] | PEN_INJECTOR | Freq: Every day | SUBCUTANEOUS | 0 refills | Status: DC

## 2021-02-09 MED ORDER — LINAGLIPTIN 5 MG PO TABS: 5.0000 mg | ORAL_TABLET | Freq: Every day | ORAL | 0 refills | Status: DC

## 2021-02-09 MED ORDER — FLUCONAZOLE 100 MG PO TABS: 200.0000 mg | ORAL_TABLET | Freq: Every day | ORAL | 0 refills | Status: AC

## 2021-02-09 MED ORDER — INSULIN ASPART 100 UNIT/ML FLEXPEN: 3.0000 [IU] | PEN_INJECTOR | Freq: Three times a day (TID) | SUBCUTANEOUS | 0 refills | Status: DC

## 2021-02-09 NOTE — Assessment & Plan Note (Signed)
Patient here for follow up of bilateral leg swelling (please see prior note from 02/07/2021 for full details). She was able to pick up her lasix a couple of days ago and has been taking it. Reports passing urine about 5-6 times each day. Does state that her swelling has improved over these past few days. Denies any SHOB, palpitations, chest pain. On exam, has about 1+ pedal edema and about 0-1+ pitting edema up to shins bilaterally. No sacral edema noted. Lungs are clear to auscultation bilaterally.   Discussed at length with patient about use of lasix. Advised to use lasix daily for the next 3-4 days (until leg swelling has resolved). Afterwards, advised to weigh herself daily and to take one tablet if weight has increased by 1lb in 1 day or by 3lbs in 1 week. Patient and daughter confirmed understanding.   Otherwise, will obtain a BMP and BNP to evaluate for volume overload and electrolyte abnormalities.  Plan: -lasix daily for next 3-4 days and then prn as described above -f/u BMP and BNP -f/u in 1 month with PCP

## 2021-02-09 NOTE — Assessment & Plan Note (Signed)
Patient found to have UTI during recent hospitalization. Cultures showing multiple species. She completed a course of keflex during hospitalization and was prescribed fluconazole for suspect candida infection (budding yeasts found on cultures). She has 5 days of treatment remaining so sent prescription to her pharmacy and advised to complete course.  Plan: -complete fluconazole therapy -repeat UA in 1 month to ensure resolution

## 2021-02-09 NOTE — Patient Instructions (Signed)
Jennifer [name],  It was a pleasure seeing you in the clinic today.   I have refilled the antifungal medications and your diabetes medications. They were sent to your pharmacy. Please take lasix 20mg  daily for the next 3-4 days (until leg swelling has gone away). Afterwards, please weight yourself every day and take a lasix tablet if your weight increases by 1 pound in 1 day or by 3 pounds in 1 week. It is very very important to take your diabetes medications (Novolog three times daily with meals, Levemir at bedtime, linagliptin once with breakfast, and metformin twice a day with meals). Your sugars are still very high and it is very important to bring them to a good level. We are getting some blood work today. I will call you with the results.  Please call our clinic at [phone] if you have any questions or concerns. The best time to call is Monday-Friday from 9am-4pm, but there is someone available 24/7 at the same number. If you need medication refills, please notify your pharmacy one week in advance and they will send [place] a request.   Thank you for letting us take part in your care. We look forward to seeing you next time!

## 2021-02-09 NOTE — Progress Notes (Signed)
   CC: acute f/u for swelling  HPI:  [name] is a 50 y.o. female with history listed below presenting to the [facility] for acute f/u for swelling. Please see individualized problem based charting for full HPI.  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  She has not seen a dentist in about one year.  Dentist is on [address].  No fevers chills or systemic symptoms.  Diabetes has been poorly controlled for some time.  She said it has improved recently averaging around 140.  Called the dentist said they would not see patients until May 14th but the urgency o   Pap smear abnormality of cervix with LGSIL    Routine/ritual circumcision    Type II diabetes mellitus ([place])    Uveitis     Review of Systems:  Negative aside from that listed in individualized problem based charting.  Physical Exam:  Vitals:   02/09/21 1030  BP: (!) 141/73  Pulse: 74  Resp: (!) 32  Temp: 98.2 F (36.8 C)  TempSrc: Oral  SpO2: 100%  Weight: 153 lb 11.2 oz (69.7 kg)  Height: 5\' 6"  (1.676 m)   Physical Exam Constitutional:      Comments: Chronically ill-appearing   Cardiovascular:     Rate and Rhythm: Normal rate and regular rhythm.     Pulses: Normal pulses.     Heart sounds: Normal heart sounds. No murmur heard.   No gallop.  Pulmonary:     Effort: Pulmonary effort is normal.     Breath sounds: Normal breath sounds. No wheezing, rhonchi or rales.  Abdominal:     General: Bowel sounds are normal. There is no distension.     Palpations: Abdomen is soft.     Tenderness: There is no abdominal tenderness.  Musculoskeletal:     Comments: 1+ pedal edema bilaterally, 0-1+ pitting edema up to bilateral shins.  Skin:    General: Skin is warm and dry.  Neurological:     Mental Status: She is alert.  Psychiatric:        Mood and Affect: Mood normal.        Behavior: Behavior normal.     Assessment & Plan:   See Encounters Tab for problem based charting.  Patient discussed with Dr. Heber [place]

## 2021-02-09 NOTE — Assessment & Plan Note (Signed)
Patient with history of uncontrolled T2DM, on novolog 3u TID with meals, levemir 15u qhs, metformin 1000mg  BID, and linagliptin 5mg  daily. She continues to have uncontrolled hyperglycemia on CGM. Discussed at length about different medications she is taking, but she endorses not taking it correctly (takes insulin twice a day and metformin twice a day, but none others mentioned). I think that she would greatly benefit from medication advice so will refer to Dr. Georgina Peer to review these with patient.  Plan: -continue levemir, novolog, metformin, and tradjenta -referral to Dr. Georgina Peer for medication assistance

## 2021-02-10 LAB — CBC WITH DIFFERENTIAL/PLATELET
Basophils Absolute: 0 10*3/uL (ref 0.0–0.2)
Basos: 1 %
EOS (ABSOLUTE): 0.2 10*3/uL (ref 0.0–0.4)
Eos: 4 %
Hematocrit: 31.1 % — ABNORMAL LOW (ref 34.0–46.6)
Hemoglobin: 10 g/dL — ABNORMAL LOW (ref 11.1–15.9)
Immature Grans (Abs): 0 10*3/uL (ref 0.0–0.1)
Immature Granulocytes: 0 %
Lymphocytes Absolute: 1.6 10*3/uL (ref 0.7–3.1)
Lymphs: 37 %
MCH: 27.8 pg (ref 26.6–33.0)
MCHC: 32.2 g/dL (ref 31.5–35.7)
MCV: 86 fL (ref 79–97)
Monocytes Absolute: 0.3 10*3/uL (ref 0.1–0.9)
Monocytes: 7 %
Neutrophils Absolute: 2.3 10*3/uL (ref 1.4–7.0)
Neutrophils: 51 %
Platelets: 153 10*3/uL (ref 150–450)
RBC: 3.6 x10E6/uL — ABNORMAL LOW (ref 3.77–5.28)
RDW: 11.9 % (ref 11.7–15.4)
WBC: 4.5 10*3/uL (ref 3.4–10.8)

## 2021-02-10 LAB — BMP8+ANION GAP
Anion Gap: 12 mmol/L (ref 10.0–18.0)
BUN/Creatinine Ratio: 16 (ref 9–23)
BUN: 14 mg/dL (ref 6–24)
CO2: 25 mmol/L (ref 20–29)
Calcium: 9.1 mg/dL (ref 8.7–10.2)
Chloride: 101 mmol/L (ref 96–106)
Creatinine, Ser: 0.85 mg/dL (ref 0.57–1.00)
Glucose: 344 mg/dL — ABNORMAL HIGH (ref 70–99)
Potassium: 4.1 mmol/L (ref 3.5–5.2)
Sodium: 138 mmol/L (ref 134–144)
eGFR: 84 mL/min/{1.73_m2} (ref >59)

## 2021-02-12 ENCOUNTER — Ambulatory Visit: Payer: Medicaid Other | Attending: Podiatry

## 2021-02-13 ENCOUNTER — Other Ambulatory Visit: Payer: Self-pay | Admitting: Obstetrics and Gynecology

## 2021-02-13 NOTE — Patient Outreach (Signed)
Care Coordination  02/13/2021  Brandon Scarbrough [birth year] [id number]   Medicaid Managed Care   Unsuccessful Outreach Note  02/13/2021 Name: Jennifer [name] MRN: [id number] DOB: [birth year]  Referred by: Jose Persia, MD Reason for referral : High Risk Managed Medicaid (Unsuccessful telephone outreach)   An unsuccessful telephone outreach was attempted today. The patient was referred to the case management team for assistance with care management and care coordination.   Follow Up Plan: The care management team will reach out to the patient again over the next 7-14 days.   [name] RN, BSN [place]  [facility] [employer] - Managed Medicaid High Risk [phone].

## 2021-02-13 NOTE — Patient Instructions (Signed)
Visit Information  [name]  - as a part of your Medicaid benefit, you are eligible for care management and care coordination services at no cost or copay. I was unable to reach you by phone today but would be happy to help you with your health related needs. Please feel free to call me at [phone]..   A member of the Managed Medicaid care management team will reach out to you again over the next 7-14 days.   [name] RN, BSN [place]  [facility] - Managed Medicaid High Risk [phone]

## 2021-02-13 NOTE — Progress Notes (Signed)
Internal Medicine Clinic Attending  Case discussed with Dr. Jinwala  At the time of the visit.  We reviewed the resident's history and exam and pertinent patient test results.  I agree with the assessment, diagnosis, and plan of care documented in the resident's note.  

## 2021-02-20 ENCOUNTER — Encounter: Payer: Self-pay | Admitting: Internal Medicine

## 2021-02-21 ENCOUNTER — Other Ambulatory Visit: Payer: Self-pay

## 2021-02-22 NOTE — Patient Outreach (Signed)
   [facility]) Care Management  02/22/2021  Jennifer [name] 1971/02/15 [id number]  LCSW completed [facility] outreach attempt today but was unable to reach patient successfully. A HIPPA compliant voice message was left encouraging patient to return call once available. LCSW will ask Scheduling Care Guide to reschedule [facility] SW appointment with patient as well.   Eula Fried, BSW, MSW, [employer] Managed Medicaid [email or web address] Phone: [phone]

## 2021-02-22 NOTE — Patient Instructions (Signed)
[name] ,   The [facility] Managed Care Team is available to provide assistance to you with your healthcare needs at no cost and as a benefit of your [facility] Health plan. I'm sorry I was unable to reach you today for our scheduled appointment. Our care guide will call you to reschedule our telephone appointment. Please call me at the number below. I am available to be of assistance to you regarding your healthcare needs. .   Thank you,   Eula Fried, BSW, MSW, LCSW Managed Medicaid [email or web address] Phone: [phone]

## 2021-02-25 ENCOUNTER — Emergency Department (HOSPITAL_COMMUNITY): Payer: Medicaid Other

## 2021-02-25 ENCOUNTER — Encounter (HOSPITAL_COMMUNITY): Payer: Self-pay | Admitting: Emergency Medicine

## 2021-02-25 ENCOUNTER — Other Ambulatory Visit: Payer: Self-pay

## 2021-02-25 ENCOUNTER — Inpatient Hospital Stay (HOSPITAL_COMMUNITY)
Admission: EM | Admit: 2021-02-25 | Discharge: 2021-03-05 | DRG: 070 | Disposition: A | Discharge status: Rehabilitation Facility | Payer: Medicaid Other | Attending: Internal Medicine | Admitting: Internal Medicine

## 2021-02-25 DIAGNOSIS — Z833 Family history of diabetes mellitus: Secondary | ICD-10-CM

## 2021-02-25 DIAGNOSIS — G939 Disorder of brain, unspecified: Principal | ICD-10-CM | POA: Diagnosis present

## 2021-02-25 DIAGNOSIS — I130 Hypertensive heart and chronic kidney disease with heart failure and stage 1 through stage 4 chronic kidney disease, or unspecified chronic kidney disease: Secondary | ICD-10-CM | POA: Diagnosis present

## 2021-02-25 DIAGNOSIS — H5462 Unqualified visual loss, left eye, normal vision right eye: Secondary | ICD-10-CM | POA: Diagnosis present

## 2021-02-25 DIAGNOSIS — Z7901 Long term (current) use of anticoagulants: Secondary | ICD-10-CM

## 2021-02-25 DIAGNOSIS — R278 Other lack of coordination: Secondary | ICD-10-CM | POA: Diagnosis present

## 2021-02-25 DIAGNOSIS — E8881 Metabolic syndrome: Secondary | ICD-10-CM | POA: Diagnosis present

## 2021-02-25 DIAGNOSIS — R509 Fever, unspecified: Secondary | ICD-10-CM | POA: Diagnosis present

## 2021-02-25 DIAGNOSIS — I252 Old myocardial infarction: Secondary | ICD-10-CM

## 2021-02-25 DIAGNOSIS — Z8616 Personal history of COVID-19: Secondary | ICD-10-CM

## 2021-02-25 DIAGNOSIS — E1143 Type 2 diabetes mellitus with diabetic autonomic (poly)neuropathy: Secondary | ICD-10-CM | POA: Diagnosis present

## 2021-02-25 DIAGNOSIS — I480 Paroxysmal atrial fibrillation: Secondary | ICD-10-CM | POA: Diagnosis present

## 2021-02-25 DIAGNOSIS — R5381 Other malaise: Secondary | ICD-10-CM | POA: Diagnosis present

## 2021-02-25 DIAGNOSIS — I152 Hypertension secondary to endocrine disorders: Secondary | ICD-10-CM | POA: Diagnosis present

## 2021-02-25 DIAGNOSIS — E1169 Type 2 diabetes mellitus with other specified complication: Secondary | ICD-10-CM | POA: Diagnosis present

## 2021-02-25 DIAGNOSIS — N179 Acute kidney failure, unspecified: Secondary | ICD-10-CM

## 2021-02-25 DIAGNOSIS — Z794 Long term (current) use of insulin: Secondary | ICD-10-CM

## 2021-02-25 DIAGNOSIS — E1165 Type 2 diabetes mellitus with hyperglycemia: Secondary | ICD-10-CM | POA: Diagnosis present

## 2021-02-25 DIAGNOSIS — Z8673 Personal history of transient ischemic attack (TIA), and cerebral infarction without residual deficits: Secondary | ICD-10-CM

## 2021-02-25 DIAGNOSIS — E785 Hyperlipidemia, unspecified: Secondary | ICD-10-CM | POA: Diagnosis present

## 2021-02-25 DIAGNOSIS — Z7984 Long term (current) use of oral hypoglycemic drugs: Secondary | ICD-10-CM

## 2021-02-25 DIAGNOSIS — G928 Other toxic encephalopathy: Secondary | ICD-10-CM | POA: Diagnosis present

## 2021-02-25 DIAGNOSIS — Z8249 Family history of ischemic heart disease and other diseases of the circulatory system: Secondary | ICD-10-CM

## 2021-02-25 DIAGNOSIS — I639 Cerebral infarction, unspecified: Secondary | ICD-10-CM | POA: Diagnosis not present

## 2021-02-25 DIAGNOSIS — H2011 Chronic iridocyclitis, right eye: Secondary | ICD-10-CM | POA: Diagnosis present

## 2021-02-25 DIAGNOSIS — D509 Iron deficiency anemia, unspecified: Secondary | ICD-10-CM | POA: Diagnosis present

## 2021-02-25 DIAGNOSIS — R634 Abnormal weight loss: Secondary | ICD-10-CM | POA: Diagnosis present

## 2021-02-25 DIAGNOSIS — E1122 Type 2 diabetes mellitus with diabetic chronic kidney disease: Secondary | ICD-10-CM | POA: Diagnosis present

## 2021-02-25 DIAGNOSIS — H4921 Sixth [abducent] nerve palsy, right eye: Secondary | ICD-10-CM | POA: Diagnosis present

## 2021-02-25 DIAGNOSIS — I959 Hypotension, unspecified: Secondary | ICD-10-CM | POA: Diagnosis present

## 2021-02-25 DIAGNOSIS — R338 Other retention of urine: Secondary | ICD-10-CM | POA: Diagnosis not present

## 2021-02-25 DIAGNOSIS — R270 Ataxia, unspecified: Secondary | ICD-10-CM | POA: Diagnosis present

## 2021-02-25 DIAGNOSIS — I5032 Chronic diastolic (congestive) heart failure: Secondary | ICD-10-CM | POA: Diagnosis present

## 2021-02-25 DIAGNOSIS — Z539 Procedure and treatment not carried out, unspecified reason: Secondary | ICD-10-CM | POA: Diagnosis present

## 2021-02-25 DIAGNOSIS — Z9114 Patient's other noncompliance with medication regimen: Secondary | ICD-10-CM

## 2021-02-25 DIAGNOSIS — Z79899 Other long term (current) drug therapy: Secondary | ICD-10-CM

## 2021-02-25 DIAGNOSIS — R159 Full incontinence of feces: Secondary | ICD-10-CM | POA: Diagnosis present

## 2021-02-25 DIAGNOSIS — R29810 Facial weakness: Secondary | ICD-10-CM | POA: Diagnosis present

## 2021-02-25 DIAGNOSIS — N183 Chronic kidney disease, stage 3 unspecified: Secondary | ICD-10-CM | POA: Diagnosis present

## 2021-02-25 DIAGNOSIS — C799 Secondary malignant neoplasm of unspecified site: Secondary | ICD-10-CM

## 2021-02-25 DIAGNOSIS — Z87442 Personal history of urinary calculi: Secondary | ICD-10-CM

## 2021-02-25 DIAGNOSIS — N390 Urinary tract infection, site not specified: Secondary | ICD-10-CM | POA: Diagnosis present

## 2021-02-25 DIAGNOSIS — F32A Depression, unspecified: Secondary | ICD-10-CM | POA: Diagnosis present

## 2021-02-25 DIAGNOSIS — H409 Unspecified glaucoma: Secondary | ICD-10-CM | POA: Diagnosis present

## 2021-02-25 DIAGNOSIS — R4182 Altered mental status, unspecified: Secondary | ICD-10-CM

## 2021-02-25 DIAGNOSIS — G541 Lumbosacral plexus disorders: Secondary | ICD-10-CM | POA: Diagnosis present

## 2021-02-25 DIAGNOSIS — Z20822 Contact with and (suspected) exposure to covid-19: Secondary | ICD-10-CM | POA: Diagnosis present

## 2021-02-25 DIAGNOSIS — E11319 Type 2 diabetes mellitus with unspecified diabetic retinopathy without macular edema: Secondary | ICD-10-CM | POA: Diagnosis present

## 2021-02-25 LAB — CBC WITH DIFFERENTIAL/PLATELET
Abs Immature Granulocytes: 0.02 10*3/uL (ref 0.00–0.07)
Basophils Absolute: 0 10*3/uL (ref 0.0–0.1)
Basophils Relative: 0 %
Eosinophils Absolute: 0.1 10*3/uL (ref 0.0–0.5)
Eosinophils Relative: 1 %
HCT: 35.6 % — ABNORMAL LOW (ref 36.0–46.0)
Hemoglobin: 11.5 g/dL — ABNORMAL LOW (ref 12.0–15.0)
Immature Granulocytes: 0 %
Lymphocytes Relative: 20 %
Lymphs Abs: 1.3 10*3/uL (ref 0.7–4.0)
MCH: 27.5 pg (ref 26.0–34.0)
MCHC: 32.3 g/dL (ref 30.0–36.0)
MCV: 85.2 fL (ref 80.0–100.0)
Monocytes Absolute: 0.2 10*3/uL (ref 0.1–1.0)
Monocytes Relative: 3 %
Neutro Abs: 4.6 10*3/uL (ref 1.7–7.7)
Neutrophils Relative %: 76 %
Platelets: 188 10*3/uL (ref 150–400)
RBC: 4.18 MIL/uL (ref 3.87–5.11)
RDW: 12.1 % (ref 11.5–15.5)
WBC: 6.2 10*3/uL (ref 4.0–10.5)
nRBC: 0 % (ref 0.0–0.2)

## 2021-02-25 LAB — COMPREHENSIVE METABOLIC PANEL
ALT: 11 U/L (ref 0–44)
AST: 15 U/L (ref 15–41)
Albumin: 3.3 g/dL — ABNORMAL LOW (ref 3.5–5.0)
Alkaline Phosphatase: 86 U/L (ref 38–126)
Anion gap: 9 (ref 5–15)
BUN: 29 mg/dL — ABNORMAL HIGH (ref 6–20)
CO2: 26 mmol/L (ref 22–32)
Calcium: 9.6 mg/dL (ref 8.9–10.3)
Chloride: 101 mmol/L (ref 98–111)
Creatinine, Ser: 1 mg/dL (ref 0.44–1.00)
GFR, Estimated: >60 mL/min (ref >60)
Glucose, Bld: 377 mg/dL — ABNORMAL HIGH (ref 70–99)
Potassium: 4.1 mmol/L (ref 3.5–5.1)
Sodium: 136 mmol/L (ref 135–145)
Total Bilirubin: 1.1 mg/dL (ref 0.3–1.2)
Total Protein: 7.3 g/dL (ref 6.5–8.1)

## 2021-02-25 LAB — URINALYSIS, ROUTINE W REFLEX MICROSCOPIC
Bilirubin Urine: NEGATIVE
Glucose, UA: >=500 mg/dL — AB
Ketones, ur: 5 mg/dL — AB
Nitrite: NEGATIVE
Protein, ur: 100 mg/dL — AB
Specific Gravity, Urine: 1.029 (ref 1.005–1.030)
pH: 5 (ref 5.0–8.0)

## 2021-02-25 LAB — RESP PANEL BY RT-PCR (FLU A&B, COVID) ARPGX2
Influenza A by PCR: NEGATIVE
Influenza B by PCR: NEGATIVE
SARS Coronavirus 2 by RT PCR: NEGATIVE

## 2021-02-25 LAB — CBG MONITORING, ED: Glucose-Capillary: 363 mg/dL — ABNORMAL HIGH (ref 70–99)

## 2021-02-25 LAB — LIPASE, BLOOD: Lipase: 23 U/L (ref 11–51)

## 2021-02-25 MED ORDER — AMLODIPINE BESYLATE 5 MG PO TABS: 2.5000 mg | ORAL_TABLET | Freq: Two times a day (BID) | ORAL | Status: DC

## 2021-02-25 MED ORDER — METFORMIN HCL ER 500 MG PO TB24: 1000.0000 mg | ORAL_TABLET | Freq: Two times a day (BID) | ORAL | Status: DC

## 2021-02-25 MED ORDER — OYSTER SHELL CALCIUM/D3 500-5 MG-MCG PO TABS
1.0000 | ORAL_TABLET | Freq: Two times a day (BID) | ORAL | Status: DC
  Administered 2021-02-27 – 2021-03-05 (×13): 1 via ORAL
  Filled 2021-02-25 (×16): qty 1

## 2021-02-25 MED ORDER — CALCIUM CITRATE-VITAMIN D 315-200 MG-UNIT PO TABS: 2.0000 | ORAL_TABLET | Freq: Two times a day (BID) | ORAL | Status: DC

## 2021-02-25 MED ORDER — ACETAMINOPHEN 650 MG RE SUPP: 650.0000 mg | Freq: Four times a day (QID) | RECTAL | Status: DC | PRN

## 2021-02-25 MED ORDER — MORPHINE SULFATE (PF) 4 MG/ML IV SOLN
4.0000 mg | Freq: Once | INTRAVENOUS | Status: AC
  Administered 2021-02-25: 4 mg via INTRAVENOUS
  Filled 2021-02-25: qty 1

## 2021-02-25 MED ORDER — GABAPENTIN 300 MG PO CAPS
300.0000 mg | ORAL_CAPSULE | Freq: Three times a day (TID) | ORAL | Status: DC
  Administered 2021-02-25 – 2021-02-27 (×6): 300 mg via ORAL
  Filled 2021-02-25 (×6): qty 1

## 2021-02-25 MED ORDER — METOCLOPRAMIDE HCL 10 MG PO TABS
5.0000 mg | ORAL_TABLET | Freq: Three times a day (TID) | ORAL | Status: DC
  Administered 2021-02-25 – 2021-02-27 (×8): 5 mg via ORAL
  Filled 2021-02-25 (×8): qty 1

## 2021-02-25 MED ORDER — ONDANSETRON HCL 4 MG/2ML IJ SOLN
4.0000 mg | Freq: Once | INTRAMUSCULAR | Status: AC
  Administered 2021-02-25: 4 mg via INTRAVENOUS
  Filled 2021-02-25: qty 2

## 2021-02-25 MED ORDER — INSULIN ASPART 100 UNIT/ML IJ SOLN: 3.0000 [IU] | Freq: Three times a day (TID) | INTRAMUSCULAR | Status: DC

## 2021-02-25 MED ORDER — ACETAMINOPHEN 325 MG PO TABS
650.0000 mg | ORAL_TABLET | Freq: Four times a day (QID) | ORAL | Status: DC | PRN
  Administered 2021-02-26 – 2021-03-03 (×4): 650 mg via ORAL
  Filled 2021-02-25 (×5): qty 2

## 2021-02-25 MED ORDER — DICLOFENAC SODIUM 1 % EX GEL: 2.0000 g | Freq: Four times a day (QID) | CUTANEOUS | Status: DC | PRN

## 2021-02-25 MED ORDER — SODIUM CHLORIDE 0.9 % IV BOLUS
1000.0000 mL | Freq: Once | INTRAVENOUS | Status: AC
  Administered 2021-02-25: 1000 mL via INTRAVENOUS

## 2021-02-25 MED ORDER — INSULIN ASPART 100 UNIT/ML IJ SOLN: 0.0000 [IU] | Freq: Three times a day (TID) | INTRAMUSCULAR | Status: DC

## 2021-02-25 MED ORDER — INSULIN ASPART 100 UNIT/ML FLEXPEN: 3.0000 [IU] | PEN_INJECTOR | Freq: Three times a day (TID) | SUBCUTANEOUS | Status: DC

## 2021-02-25 MED ORDER — LOSARTAN POTASSIUM 50 MG PO TABS: 25.0000 mg | ORAL_TABLET | Freq: Every day | ORAL | Status: DC

## 2021-02-25 MED ORDER — LINAGLIPTIN 5 MG PO TABS
5.0000 mg | ORAL_TABLET | Freq: Every day | ORAL | Status: DC
  Administered 2021-02-25 – 2021-03-05 (×9): 5 mg via ORAL
  Filled 2021-02-25 (×9): qty 1

## 2021-02-25 MED ORDER — METOPROLOL TARTRATE 25 MG PO TABS
12.5000 mg | ORAL_TABLET | Freq: Two times a day (BID) | ORAL | Status: DC
  Administered 2021-02-25: 12.5 mg via ORAL
  Filled 2021-02-25: qty 1

## 2021-02-25 MED ORDER — ONDANSETRON HCL 4 MG PO TABS: 4.0000 mg | ORAL_TABLET | Freq: Four times a day (QID) | ORAL | Status: DC | PRN

## 2021-02-25 MED ORDER — BRIMONIDINE TARTRATE 0.2 % OP SOLN
1.0000 [drp] | Freq: Three times a day (TID) | OPHTHALMIC | Status: DC
  Administered 2021-02-26 – 2021-03-05 (×19): 1 [drp] via OPHTHALMIC
  Filled 2021-02-25 (×2): qty 5

## 2021-02-25 MED ORDER — ATORVASTATIN CALCIUM 40 MG PO TABS
40.0000 mg | ORAL_TABLET | Freq: Every day | ORAL | Status: DC
  Administered 2021-02-25 – 2021-02-28 (×4): 40 mg via ORAL
  Filled 2021-02-25 (×4): qty 1

## 2021-02-25 MED ORDER — IOHEXOL 350 MG/ML SOLN
75.0000 mL | Freq: Once | INTRAVENOUS | Status: AC | PRN
  Administered 2021-02-25: 75 mL via INTRAVENOUS

## 2021-02-25 MED ORDER — ENSURE ENLIVE PO LIQD: 237.0000 mL | Freq: Two times a day (BID) | ORAL | Status: DC

## 2021-02-25 MED ORDER — ONDANSETRON HCL 4 MG/2ML IJ SOLN
4.0000 mg | Freq: Four times a day (QID) | INTRAMUSCULAR | Status: DC | PRN
  Administered 2021-02-26: 4 mg via INTRAVENOUS
  Filled 2021-02-25: qty 2

## 2021-02-25 MED ORDER — INSULIN GLARGINE-YFGN 100 UNIT/ML ~~LOC~~ SOLN
15.0000 [IU] | Freq: Every day | SUBCUTANEOUS | Status: DC
  Administered 2021-02-26: 15 [IU] via SUBCUTANEOUS
  Filled 2021-02-25 (×2): qty 0.15

## 2021-02-25 MED ORDER — DORZOLAMIDE HCL-TIMOLOL MAL 2-0.5 % OP SOLN
1.0000 [drp] | Freq: Two times a day (BID) | OPHTHALMIC | Status: DC
  Administered 2021-02-26 – 2021-03-05 (×14): 1 [drp] via OPHTHALMIC
  Filled 2021-02-25: qty 10

## 2021-02-25 MED ORDER — GADOBUTROL 1 MMOL/ML IV SOLN
7.0000 mL | Freq: Once | INTRAVENOUS | Status: AC | PRN
  Administered 2021-02-25: 7 mL via INTRAVENOUS

## 2021-02-25 MED ORDER — INSULIN DETEMIR 100 UNIT/ML FLEXPEN: 15.0000 [IU] | PEN_INJECTOR | Freq: Every day | SUBCUTANEOUS | Status: DC

## 2021-02-25 NOTE — ED Notes (Signed)
Patient returns from MRI

## 2021-02-25 NOTE — H&P (Addendum)
Date: 02/26/2021               Patient Name:  Jennifer [name] MRN: [id number]  DOB: [birth year] Age / Sex: 50 y.o., female   PCP: Jose Persia, MD         Medical Service: Internal Medicine Teaching Service         Attending Physician: Dr. Angelica Pou, MD    First Contact: Dr. Ileene Musa Pager: [phone]  Second Contact: Dr. 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  Patient speaks Arabic and patient's daughter assists with interpretation.   Patient reports 9AM this morning she had a gradual onset headache with photophobia.  Headache affecting both sides of her head and into her eyes, no 1 spot that is the most painful.  No improvement with Tylenol at home.  Has not had a headache like this prior to today.  Patient has also had associated nausea with vomiting.  Patient has vomited a few times today, last vomited on arrival to the ED.  Headache 9/10 at its worst earlier today, currently headache is 4/10 without photophobia.  Patient currently not having any nausea and no further vomiting after receiving Zofran in the ED.  No recent visual changes, worsening weakness, difficulty swallowing, changes in speech.  Patient denies breast pain, breast lumps, skin changes overlying breast, breast discharge.  She denies abdominal pain, hematochezia.  Had diarrhea last few days which she thinks is from one of her medications.  She has had significant weight loss in the last year, weight 218 pounds in January and now weighs 150 pounds.  Weight loss thought to be due  to gastroparesis and poor appetite per chart review.  She denies fevers, has occasional night sweats/chills.  No new suspicious skin lesions per patient and daughter. Denies current dental pain.  LMP 1.5 years ago without abnormal uterine bleeding.  Has intermittent fecal incontinence.  Has ambulated significantly less around the house following right ankle injury in January 2022.  Has needed assistance with ambulation, dragging right foot.  Daughter reports patient is sleeping to the day for several months.  Daughter reports patient has not been adherent to medications.  Daughter reports patient is taking her insulin and checking her blood sugars at home.  However while going through medications at bedside, patient has not taken gabapentin, amlodipine, midodrine, metoprolol, Tradjenta.  Intermittently takes Eliquis. daughter reports patient will be untruthful about medication adherence so that she does not get in trouble. Recently been wearing depends, has intermittent fecal incontinence.  ED Course: On arrival patient febrile with blood pressure in the 200s otherwise vital signs stable.  CBC notable for mild normocytic anemia with hemoglobin 11.5.  CMP with normal electrolytes and blood glucose 377 with BUN 29 and normal creatinine.  UA with few bacteria without pyuria.  MRI brain showed 2 enhancing lesions in the left occipital lobe.  ED consulted neurosurgery.  Medicine called for admission.  Meds:  Current Outpatient Medications  Medication Instructions   Accu-Chek FastClix Lancets MISC Check blood  sugar 4 times a day   ACCU-CHEK GUIDE test strip CHECK BLOOD SUGAR 4 TIMES PER DAY   amLODipine (NORVASC) 2.5 mg, Oral, 2 times daily   apixaban (ELIQUIS) 5 mg, Oral, 2 times daily   atorvastatin (LIPITOR) 40 mg, Oral, Daily   Blood Glucose Monitoring Suppl (ACCU-CHEK GUIDE) w/Device KIT 1 each, Does not apply, 4 times daily   brimonidine (ALPHAGAN) 0.2 % ophthalmic solution 1 drop, Left Eye, Every 8  hours   calcium citrate-vitamin D (CITRACAL+D) 315-200 MG-UNIT tablet 2 tablets, Oral, 2 times daily   diclofenac Sodium (VOLTAREN) 2 g, Topical, 4 times daily PRN   dorzolamide-timolol (COSOPT) 22.3-6.8 MG/ML ophthalmic solution 1 drop, Left Eye, 2 times daily   feeding supplement (ENSURE ENLIVE / ENSURE PLUS) LIQD 237 mLs, Oral, 2 times daily between meals   furosemide (LASIX) 20 MG tablet Please take 1 tablet daily as needed for swelling.   gabapentin (NEURONTIN) 300 mg, Oral, 3 times daily   insulin aspart (NOVOLOG) 3 Units, Subcutaneous, 3 times daily with meals   insulin detemir (LEVEMIR) 100 UNIT/ML FlexPen Inject 15 Units into the skin at bedtime   Insulin Pen Needle (B-D UF III MINI PEN NEEDLES) 31G X 5 MM MISC Use pen needle daily for injections   Insulin Pen Needle (PENTIPS) 32G X 4 MM MISC Use as directed with insulin pen   linagliptin (TRADJENTA) 5 mg, Oral, Daily   losartan (COZAAR) 25 mg, Oral, Daily   metFORMIN (GLUCOPHAGE-XR) 1,000 mg, Oral, 2 times daily   metoCLOPramide (REGLAN) 5 mg, Oral, 3 times daily before meals & bedtime   metoprolol tartrate (LOPRESSOR) 12.5 mg, Oral, 2 times daily    Allergies: Allergies as of 02/25/2021   (No Known Allergies)   Past Medical History:  Diagnosis Date   Anemia, iron deficiency    Atrial fibrillation (HCC)    Atrial fibrillation with RVR ([place]) 02/03/2021   Blindness of left eye    Closed trimalleolar fracture of right ankle 05/02/2020   Added automatically from request for surgery [id number]   COVID-19 virus infection 06/15/2020   Decreased visual acuity    Left eye   Depression    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  She has not seen a dentist in about one year.  Dentist is on [address].  No fevers chills or systemic symptoms.  Diabetes has been poorly controlled for some time.  She said it has improved recently averaging around 140.  Called the dentist said they would not see patients until May 14th but the urgency o   Pap smear abnormality of cervix with LGSIL    Routine/ritual circumcision    Type II diabetes mellitus ([place])    Uveitis     Family History:  Family History  Problem Relation Age of Onset   Diabetes Father    Hypertension Other   Fam Cancer Hx Denies family history of cancers   Social History:  Social History   Tobacco Use   Smoking status: Never   Smokeless  tobacco: Never  Vaping Use   Vaping Use: Never used  Substance Use Topics   Alcohol use: No   Drug use: No  Patient lives with her husband, daughter, son.  Requires assistance with some ADLs and all IADLs.  Has had difficulty ambulating since ankle injury in January.   Review of Systems: A complete ROS was negative except as per HPI.   Physical Exam: Blood pressure (!) 107/58, pulse 71, temperature 97.7 F (36.5 C), temperature source Oral, resp. rate 11, last menstrual period 08/21/2016, SpO2 99 %. Physical Exam: General: Uncomfortable appearing middle [place] female, NAD HENT: normocephalic, atraumatic, MMM, no obvious dental concerns EYES: conjunctiva non-erythematous, no scleral icterus CV: regular rate, normal rhythm, no murmurs, rubs, gallops. Pulmonary: normal work of breathing on RA, lungs clear to auscultation, no rales, wheezes, rhonchi Abdominal: non-distended, soft, non-tender to palpation, normal BS Skin: Warm and dry, no rashes or lesions on exposed skin Neurological: Mental Status: Patient is awake, alert, oriented x3 Speech and language:  normal, fluent  Cranial Nerves: II:  R pupil 45m, round, and reactive to light, L pupil cataract.  III,IV, VI: R eye lateral gaze deficit, without ptosis or diploplia. Otherwise EOM intact. V: Facial sensation is symmetric  VII: R facial droop VIII: Hearing is intact to voice X: Uvula and palate elevate symmetrically XI: Shoulder shrug is symmetric. XII: Tongue is midline without atrophy or fasciculations.  Motor:  Motor Exam: RUE Flexion: 4/5 RUE Extension: 4-/5 LUE Flexion: 4/5 LUE Extension: 3/5 R Grip: 5/5 L Grip: 5/5 R Hip Flexion: 3/5 L Hip Flexion: 3/5 RLE Flexion: 4/5 RLE Extension: 4/5 LLE Flexion: 4/5 LLE Extension: 4/5 Dorsiflexion: 2/5 Plantar Flexion: 4/5 Sensory: Sensation is grossly intact bilateral UEs & LEs Gait: deferred  Psych: normal affect  EKG: personally reviewed my interpretation is NSR  CT HEAD WITHOUT CONTRAST New hypodensity left posterior parietal lobe, not present 1 month ago. Possible small area of adjacent hemorrhage. Differential includes hemorrhagic infarct versus mass lesion.  Electronically Signed   By: CFranchot GalloM.D.  MRI HEAD WITHOUT AND WITH CONTRAST 1. Two adjacent enhancing lesions in the left occipital lobe with mild edema, indeterminate but suspicious for metastases. 2. Mild chronic small vessel ischemic disease and moderate cerebral atrophy. Electronically Signed   By: ALogan BoresM.D.  CT CHEST, ABDOMEN, AND PELVIS WITH CONTRAST No evidence of malignancy in the chest, abdomen or pelvis. Mammography would be necessary to fully assess the breast tissue. Electronically Signed   By: KUlyses JarredM.D.  CT CERVICAL SPINE WITHOUT CONTRAST No acute fracture or static subluxation of the cervical spine. No osseous metastatic disease. Electronically Signed   By: KUlyses JarredM.D.   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   #Two enhancing lesions in the left occipital lobe with mild edema, suspicious for metastases #Concern for malignancy Patient presents with 1 day history nausea vomiting and headache.  Given red flag symptoms of new headache in patient without history of headaches, and nausea with vomiting there was concern for intracranial process causing increased ICP.  MRI brain showed 2 enhancing lesions in the left occipital lobe adjacent to each other with mild edema suspicious for metastases.  Neurosurgery was consulted and recommended holding Eliquis given concern for ICH and no steroids at this time.  Neurosurgery will see the patient in the morning. There is no CT imaging was obtained given concern for metastatic disease. CT cervical spine and CT chest abdomen pelvis negative for malignancy.  Common cancers to metastasize to the brain include lung, colon, breast, melanoma.  Also considered granulomatous disease and other infectious causes of intracranial mass,  though less likely in patient without other signs of infection.  Less likely abscess given no ring-enhancing pattern of lesion.  Patient's weight loss concerning for malignancy.  Patient has not had mammogram, colonoscopy completed per chart review.  No hematochezia, hemoglobin stable, no reported symptoms of breast disease. Plan: -Neurosurgery following, appreciate recommendations -Considering inpatient mammogram and colonoscopy -Comprehensive skin exam   #R Abducens Palsy #R Facial Droop Patient noted to have right eye lateral gaze deficit as well as right facial droop on neurological exam today.  Unclear LKN.  No documentation of  cranial nerve deficits on ED exam.  Concern for stroke in July 2022, noted asymmetric palatal elevation, mild RLE weakness on exam, however imaging negative for acute stroke and final diagnosis was neuropathy.  Cranial nerve palsies could be secondary to TIA in the setting of hypotension from autonomic dysfunction from patient's uncontrolled diabetes.  CN VI palsy can be seen in increased ICP.  Location of to brain lesions identified on MRI not consistent with localization of CN VI and VII palsies.  Also considered vasculitis and other autoimmune process or postinfectious process however these seem less likely.  More likely microvascular process localizing to the pons given atherosclerotic risk factors. Plan: -Given no acute stroke identified on MRI, no urgent need for CTA or MRA at this time -Consult neurology in the morning for further cranial nerve palsy work-up   #Nausea and Vomiting Nausea has improved, no further vomiting since arrival to ED.  Was given morphine in the ED as well as Zofran.  Also received approximately 500 cc fluids. Plan: - Zofran PRN   #Physical deconditioning Family reports patient has been mostly in bed, sleeping most of the day, requiring assistance with ambulation and transfers and some ADLs.  This is all been happening since her ankle fracture January 2022.  Patient worked with physical therapy after ankle fracture but has been unable to work with home health PT since despite several hospitalizations this year for which she was referred to outpatient physical therapy.  Patient no showed her appointment on 10/24. Plan: -Consult PT/OT eval and treat   #Poorly controlled insulin-dependent type 2 diabetes #Hyperglycemia #Diabetic neuropathy and diabetic retinopathy #Concern for Gastroparesis Patient has history of poorly controlled diabetes, last hemoglobin A1c 10/16 was 12.1%.  Patient has been prescribed takes metformin 1000 mg twice daily, Tradjenta 5 mg daily,  Levemir 15 units at bedtime, units with meals.  Gabapentin 300 mg 3 times daily for neuropathy.  Has previously been prescribed Reglan 5 mg 4 times daily for concern for gastroparesis.  Unfortunately patient is nonadherent to her regimen.  Not currently taking gabapentin or Reglan.  Intermittently taking insulin and metformin.  Blood sugars this admission in the 300s. Plan: -Holding Metformin 1000 mg twice daily 2/2 AKI -Semglee 15 units nightly -NovoLog 3 units with meals -Sliding scale insulin resistant -Reglan 5 mg 3 times daily before meals and nightly -Tradjenta 5 mg  daily -Gabapentin 300 mg 3 times daily -CBGs before meals and at bedtime   #HTN #Hypotension #Suspected Autonomic Dysfunction Patient low blood pressures in 200s in the ED, however several hours later as patient is sleeping blood pressures dropped as low as 80 systolic.  Suspect diabetic autonomic neuropathy causing hyper and hypotension.  Patient has previously been prescribed midodrine for hypotension.  Currently prescribed Lopressor 12.5 mg twice daily, losartan 25 mg daily, amlodipine 2.5 mg twice daily.  Patient not currently taking any of her antihypertensive medications. Plan: -Initially restarted losartan 42m daily, Metoprolol tartrate 12.538mBID, however now holding secondary to hypotension -Additional 500 cc bolus   #Prerenal AKI likely 2/2 GI fluid losses 3 AM labs with creatinine 1.24 and BUN 31 consistent with prerenal AKI likely secondary to GI fluid losses from vomiting and poor p.o. intake. Plan: -Status post 500 cc fluid bolus in the ED -We will give additional 500 cc fluid bolus -Avoid nephrotoxic agents -Daily BMP   #Atrial Fibrillation Not currently in A. fib.  Normal heart rate.  On Eliquis.  Intracranial bleeding risk secondary to brain lesions suspected mets. Plan: -Holding Eliquis in the setting of increased bleeding risk -Continuous cardiac monitoring   #HFpEF Currently euvolemic on  exam.  No SOB.  Last echo 10/16 with EF 60 to 65% and normal left ventricular function.  Grade 1 diastolic dysfunction.  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 Prescribed furosemide as needed for lower extremity swelling.  Not currently taking this medication.  No current indication for diuretics.   Other home medications continued this admission: Atorvastatin, brimonidine, calcium-vit d, dorzolamide-timolol  Diet: HH/CM VTE: SCDs IVF: None Code: Full  Dispo: Admit patient to Observation with expected length of stay less than 2 midnights.  Signed: ZiWayland DenisMD 02/26/2021, 1:53 AM  Pager: : [phone] 5pm on weekdays and 1pm on weekends: On Call pager: [phone]

## 2021-02-25 NOTE — ED Notes (Signed)
Patient transported to MRI 

## 2021-02-25 NOTE — ED Provider Notes (Signed)
Emergency Medicine Provider Triage Evaluation Note  Jennifer [name] , a 50 y.o. female  was evaluated in triage.  Pt complains of nausea, vomiting that began this morning around 8 AM.  She is also been complaining of a headache.  Family reports that her sugar was elevated in the 200s today and then with EMS it was 357.  They are unsure if she has been compliant with her medications.  Patient denies any abdominal pain.  Family reports that she does not typically get headaches however then states that she has migraine headaches.  Patient denies any vision changes.  Family has not noticed any speech changes.  Family  interpreting at this time in triage.  Patient speaks Arabic.  Review of Systems  Positive: + headache, nausea, vomiting Negative: - abdominal pain, diarrhea, fever  Physical Exam  BP (!) 169/76 (BP Location: Left Arm)   Pulse 80   Temp 97.7 F (36.5 C) (Oral)   Resp 15   LMP 08/21/2016 (Exact Date)   SpO2 97%  Gen:   Awake, no distress   Resp:  Normal effort  MSK:   Moves extremities without difficulty  Other:  Neuro intact  Medical Decision Making  Medically screening exam initiated at 1:23 PM.  Appropriate orders placed.  Jennifer [name] was informed that the remainder of the evaluation will be completed by another provider, this initial triage assessment does not replace that evaluation, and the importance of remaining in the ED until their evaluation is complete.     Eustaquio Maize, PA-C 02/25/21 1325    Malvin Johns, MD 02/25/21 1445

## 2021-02-25 NOTE — ED Provider Notes (Signed)
[facility] Provider Note   CSN: [id number] Arrival date & time: 02/25/21  1304     History No chief complaint on file.   Jennifer [name] is a 50 y.o. female.  HPI Patient is a 50 year old female with a history of atrial fibrillation with RVR, blindness of the left eye, glaucoma, hyperlipidemia, hypertension, diabetes mellitus, NSTEMI, CKD, who presents to the emergency department due to a headache.  Her son is at bedside and translates for the patient.  He states that around 11 AM she began developing a diffuse headache.  States her symptoms have been worsening.  Reports multiple episodes of nausea/vomiting.  No acute numbness, weakness, chest pain, shortness of breath, visual changes.    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  She has not seen a dentist in about one year.  Dentist is on [address].  No fevers chills or systemic symptoms.  Diabetes has been poorly controlled for some time.  She said it has improved recently averaging around 140.  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 ([place]) 11/29/2020   Orthostatic hypotension 11/22/2020   Lower extremity weakness 11/22/2020   CKD (chronic kidney disease) stage 3, GFR 30-59 ml/min ([place]) 11/22/2020   Depression 08/22/2020   Weight loss, non-intentional 07/04/2020   Adult failure to thrive 07/04/2020   Early satiety 05/31/2020   Urinary tract infection with hematuria 05/16/2020   Bilateral lower extremity edema 06/07/2019   Atrial fibrillation ([place]) 09/20/2018   Migraine    (HFpEF) heart failure with preserved ejection fraction ([place]) 01/01/2018   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   Pseudophakia of right eye 09/16/2017   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   Fatigue 04/02/2016   Uveitic glaucoma of left eye, 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   Vitamin D deficiency 10/30/2014   Diabetic neuropathy ([place]) 05/13/2013   Healthcare maintenance 02/26/2011   Hyperlipidemia 05/05/2006   Type II diabetes mellitus ([place]) 04/22/1998    Past Surgical History:  Procedure Laterality Date   CESAREAN SECTION     x 1   EYE SURGERY Bilateral  Lasik   LEFT HEART CATH AND CORONARY ANGIOGRAPHY N/A 02/05/2021   Procedure: LEFT HEART CATH AND CORONARY ANGIOGRAPHY;  Surgeon: Dixie Dials, MD;  Location: [facility];  Service: Cardiovascular;  Laterality: N/A;   MULTIPLE TOOTH EXTRACTIONS     bottom denture   ORIF ANKLE FRACTURE Right 05/05/2020   Procedure: OPEN REDUCTION INTERNAL FIXATION (ORIF) ANKLE FRACTURE;  Surgeon: Shona Needles, MD;  Location: [facility];  Service: Orthopedics;  Laterality:  Right;     OB History     Gravida  7   Para  4   Term  4   Preterm  0   AB  3   Living         SAB  3   IAB  0   Ectopic  0   Multiple      Live Births              Family History  Problem Relation Age of Onset   Diabetes Father    Hypertension Other     Social History   Tobacco Use   Smoking status: Never   Smokeless tobacco: Never  Vaping Use   Vaping Use: Never used  Substance Use Topics   Alcohol use: No   Drug use: No    Home Medications Prior to Admission medications   Medication Sig Start Date End Date Taking? Authorizing Provider  Accu-Chek FastClix Lancets MISC Check blood sugar 4 times a day 06/07/19   Jose Persia, MD  ACCU-CHEK GUIDE test strip CHECK BLOOD SUGAR 4 TIMES PER DAY 06/23/20   Mosetta Anis, MD  amLODipine (NORVASC) 2.5 MG tablet Take 1 tablet (2.5 mg total) by mouth 2 (two) times daily. 02/07/21   Jose Persia, MD  apixaban (ELIQUIS) 5 MG TABS tablet Take 1 tablet (5 mg total) by mouth 2 (two) times daily. 01/05/21   Mitzi Hansen, MD  atorvastatin (LIPITOR) 40 MG tablet Take 1 tablet (40 mg total) by mouth daily. 01/05/21   Mitzi Hansen, MD  Blood Glucose Monitoring Suppl (ACCU-CHEK GUIDE) w/Device KIT 1 each by Does not apply route 4 (four) times daily. 11/19/18   Jose Persia, MD  brimonidine (ALPHAGAN) 0.2 % ophthalmic solution Place 1 drop into the left eye every 8 (eight) hours. 08/01/20   [provider]  calcium citrate-vitamin D (CITRACAL+D) 315-200 MG-UNIT tablet Take 2 tablets by mouth 2 (two) times daily. 01/05/21 01/05/22  Mitzi Hansen, MD  diclofenac Sodium (VOLTAREN) 1 % GEL Apply 2 g topically 4 (four) times daily as needed (foot pain).    [provider]  dorzolamide-timolol (COSOPT) 22.3-6.8 MG/ML ophthalmic solution Place 1 drop into the left eye 2 (two) times daily. 10/16/20   [provider]  feeding supplement (ENSURE ENLIVE / ENSURE PLUS) LIQD Take 237 mLs by mouth 2  (two) times daily between meals. 02/06/21   Orvis Brill, MD  furosemide (LASIX) 20 MG tablet Please take 1 tablet daily as needed for swelling. 02/07/21   Virl Axe, MD  gabapentin (NEURONTIN) 300 MG capsule Take 1 capsule (300 mg total) by mouth 3 (three) times daily. 01/05/21 01/05/22  Mitzi Hansen, MD  insulin aspart (NOVOLOG) 100 UNIT/ML FlexPen Inject 3 Units into the skin 3 (three) times daily with meals. 02/09/21 03/11/21  Virl Axe, MD  insulin detemir (LEVEMIR) 100 UNIT/ML FlexPen Inject 15 Units into the skin at bedtime 02/09/21 03/11/21  Virl Axe, MD  Insulin Pen Needle (B-D UF III MINI PEN NEEDLES)  31G X 5 MM MISC Use pen needle daily for injections 06/23/20   Mosetta Anis, MD  Insulin Pen Needle (PENTIPS) 32G X 4 MM MISC Use as directed with insulin pen 11/29/20   Harvie Heck, MD  linagliptin (TRADJENTA) 5 MG TABS tablet Take 1 tablet (5 mg total) by mouth daily. 02/09/21 03/11/21  Virl Axe, MD  losartan (COZAAR) 25 MG tablet Take 1 tablet (25 mg total) by mouth daily. 02/07/21   Orvis Brill, MD  metFORMIN (GLUCOPHAGE-XR) 500 MG 24 hr tablet TAKE 2 TABLETS (1,000 MG TOTAL) BY MOUTH IN THE MORNING AND AT BEDTIME. 01/23/21 02/22/21  Jose Persia, MD  metoCLOPramide (REGLAN) 5 MG tablet Take 1 tablet (5 mg total) by mouth 4 (four) times daily -  before meals and at bedtime. 11/29/20 12/29/20  Harvie Heck, MD  metoprolol tartrate (LOPRESSOR) 25 MG tablet Take 0.5 tablets (12.5 mg total) by mouth 2 (two) times daily. 02/07/21 03/09/21  Jose Persia, MD  Continuous Blood Gluc Sensor (DEXCOM G6 SENSOR) MISC Use to check blood sugar at least 6 times a day Patient not taking: Reported on 08/29/2020 08/09/20 09/27/20  Harvie Heck, MD  Continuous Blood Gluc Transmit (DEXCOM G6 TRANSMITTER) MISC Use to check blood sugar at least 6 times a day Patient not taking: Reported on 08/29/2020 08/09/20 09/27/20  Harvie Heck, MD    Allergies    Patient has no known  allergies.  Review of Systems   Review of Systems  All other systems reviewed and are negative. Ten systems reviewed and are negative for acute change, except as noted in the HPI.   Physical Exam Updated Vital Signs BP (!) 149/69   Pulse 86   Temp 97.7 F (36.5 C) (Oral)   Resp 16   LMP 08/21/2016 (Exact Date)   SpO2 100%   Physical Exam Vitals and nursing note reviewed.  Constitutional:      General: She is not in acute distress.    Appearance: Normal appearance. She is not ill-appearing, toxic-appearing or diaphoretic.  HENT:     Head: Normocephalic and atraumatic.     Right Ear: External ear normal.     Left Ear: External ear normal.     Nose: Nose normal.     Mouth/Throat:     Mouth: Mucous membranes are moist.     Pharynx: Oropharynx is clear. No oropharyngeal exudate or posterior oropharyngeal erythema.  Cardiovascular:     Rate and Rhythm: Normal rate and regular rhythm.     Pulses: Normal pulses.     Heart sounds: Normal heart sounds. No murmur heard.   No friction rub. No gallop.  Pulmonary:     Effort: Pulmonary effort is normal. No respiratory distress.     Breath sounds: Normal breath sounds. No stridor. No wheezing, rhonchi or rales.  Abdominal:     General: Abdomen is flat.     Tenderness: There is no abdominal tenderness.  Musculoskeletal:        General: Normal range of motion.     Cervical back: Normal range of motion and neck supple. No tenderness.  Skin:    General: Skin is warm and dry.  Neurological:     General: No focal deficit present.     Mental Status: She is alert and oriented to person, place, and time.     Comments: Patient is oriented to person, place, and time. Patient phonates in clear, complete, and coherent sentences. Strength is 5/5 in all four extremities. Distal sensation  intact in all four extremities.  Psychiatric:        Mood and Affect: Mood normal.        Behavior: Behavior normal.    ED Results / Procedures / Treatments    Labs (all labs ordered are listed, but only abnormal results are displayed) Labs Reviewed  COMPREHENSIVE METABOLIC PANEL - Abnormal; Notable for the following components:      Result Value   Glucose, Bld 377 (*)    BUN 29 (*)    Albumin 3.3 (*)    All other components within normal limits  CBC WITH DIFFERENTIAL/PLATELET - Abnormal; Notable for the following components:   Hemoglobin 11.5 (*)    HCT 35.6 (*)    All other components within normal limits  RESP PANEL BY RT-PCR (FLU A&B, COVID) ARPGX2  LIPASE, BLOOD  URINALYSIS, ROUTINE W REFLEX MICROSCOPIC  CBG MONITORING, ED    EKG EKG Interpretation  Date/Time:  Sunday February 25 2021 13:34:10 EST Ventricular Rate:  81 PR Interval:  142 QRS Duration: 86 QT Interval:  410 QTC Calculation: 476 R Axis:   1 Text Interpretation: Normal sinus rhythm Normal ECG Confirmed by Dene Gentry [phone]) on 02/25/2021 3:53:18 PM  Radiology CT HEAD WO CONTRAST (5MM)  Result Date: 02/25/2021 CLINICAL DATA:  Headache EXAM: CT HEAD WITHOUT CONTRAST TECHNIQUE: Contiguous axial images were obtained from the base of the skull through the vertex without intravenous contrast. COMPARISON:  CT head 01/29/2021 FINDINGS: Brain: Interval development of 2 cm hypodensity in the posterior parietal lobe on the left. Possible small area of associated hemorrhage. Low-density area appears involve cortex and white matter. No other areas of new low-density identified.  Mild atrophy. Vascular: Negative for hyperdense vessel Skull: Negative Sinuses/Orbits: Negative Other: None IMPRESSION: New hypodensity left posterior parietal lobe, not present 1 month ago. Possible small area of adjacent hemorrhage. Differential includes hemorrhagic infarct versus mass lesion. Recommend MRI brain without with contrast for further evaluation. These results were called by telephone at the time of interpretation on 02/25/2021 at 3:12 pm to provider MARGAUX VENTER , who verbally acknowledged  these results. Electronically Signed   By: Franchot Gallo M.D.   On: 02/25/2021 15:12   MR BRAIN W WO CONTRAST  Result Date: 02/25/2021 CLINICAL DATA:  Brain mass or lesion. EXAM: MRI HEAD WITHOUT AND WITH CONTRAST TECHNIQUE: Multiplanar, multiecho pulse sequences of the brain and surrounding structures were obtained without and with intravenous contrast. CONTRAST:  55m GADAVIST GADOBUTROL 1 MMOL/ML IV SOLN COMPARISON:  Head CT 02/25/2021 and MRI 11/07/2020 FINDINGS: Brain: A heterogeneously enhancing lesion posteriorly in the superior portion of the left occipital lobe measures 21 x 13 mm with mild surrounding edema. Associated susceptibility artifact is compatible with old blood products. A separate, smaller adjacent enhancing lesion more inferiorly in the left occipital lobe measures 7 mm. Small T2 hyperintensities elsewhere in the cerebral white matter bilaterally are unchanged from the prior MRI and are nonspecific but compatible with mild chronic small vessel ischemic disease. Cerebral atrophy is moderately advanced for age. Vascular: Major intracranial vascular flow voids are preserved. Skull and upper cervical spine: Unremarkable bone marrow signal. Sinuses/Orbits: Right cataract extraction.  Clear paranasal sinuses. Other: None. IMPRESSION: 1. Two adjacent enhancing lesions in the left occipital lobe with mild edema, indeterminate but suspicious for metastases. 2. Mild chronic small vessel ischemic disease and moderate cerebral atrophy. Electronically Signed   By: ALogan BoresM.D.   On: 02/25/2021 18:05    Procedures Procedures   Medications  Ordered in ED Medications  sodium chloride 0.9 % bolus 1,000 mL (has no administration in time range)  morphine 4 MG/ML injection 4 mg (4 mg Intravenous Given 02/25/21 1626)  ondansetron (ZOFRAN) injection 4 mg (4 mg Intravenous Given 02/25/21 1625)  gadobutrol (GADAVIST) 1 MMOL/ML injection 7 mL (7 mLs Intravenous Contrast Given 02/25/21 1702)    ED  Course  I have reviewed the triage vital signs and the nursing notes.  Pertinent labs & imaging results that were available during my care of the patient were reviewed by me and considered in my medical decision making (see chart for details).  Clinical Course as of 02/25/21 1901  Nancy Fetter Feb 25, 2021  6720 I spoke to Dr. Kathyrn Sheriff with neurosurgery.  They agree with medicine admission and they will consult.  Recommend a work-up for metastatic disease.  We will obtain a CT scan of the neck, chest, abdomen, and pelvis.  Recommend avoiding steroids and patient's Eliquis at this time. [LJ]    Clinical Course User Index [LJ] Rayna Sexton, PA-C   MDM Rules/Calculators/A&P                          Pt is a 50 y.o. female who presents to the emergency department due to began at 11 AM this morning with associated nausea and vomiting.  Labs: CBC with a hemoglobin of 11.5 and hematocrit of 35.6. CMP with a glucose of 377, BUN of 29, albumin of 3.3. Lipase of 23. Respiratory panel is pending.  Imaging: CT scan of the head without contrast shows IMPRESSION: New hypodensity left posterior parietal lobe, not present 1 month ago. Possible small area of adjacent hemorrhage. Differential includes hemorrhagic infarct versus mass lesion. Recommend MRI brain without with contrast for further evaluation.  MRI of the brain shows IMPRESSION: 1. Two adjacent enhancing lesions in the left occipital lobe with mild edema, indeterminate but suspicious for metastases. 2. Mild chronic small vessel ischemic disease and moderate cerebral atrophy.  I, Rayna Sexton, PA-C, personally reviewed and evaluated these images and lab results as part of my medical decision-making.  Patient discussed with Dr. Kathyrn Sheriff with neurosurgery.  Recommends additional CT scans for evaluation of metastatic disease.  These have been ordered.  Recommends that we hold off on steroids as well as anticoagulants for the moment.  Will admit  to medicine for further work-up.  Neurosurgery will consult.  Will discuss with the medicine team at this time.  Note: Portions of this report may have been transcribed using voice recognition software. Every effort was made to ensure accuracy; however, inadvertent computerized transcription errors may be present.   Final Clinical Impression(s) / ED Diagnoses Final diagnoses:  Metastatic disease [facility])  Brain lesion   Rx / DC Orders ED Discharge Orders     None        Rayna Sexton, PA-C 02/25/21 1903    Valarie Merino, MD 02/25/21 2315

## 2021-02-25 NOTE — ED Triage Notes (Signed)
Pt to triage via [facility] from home.  Reports headache and nausea since 8am.  Family believes she is non-compliant with medication including Eliquis.    EMS-  CBG 357 IV- 20g RAC Zofran 4mg  IV 190/90 84 18 99%RA

## 2021-02-25 NOTE — ED Notes (Signed)
Patient transported to CT 

## 2021-02-26 ENCOUNTER — Encounter (HOSPITAL_COMMUNITY): Payer: Self-pay | Admitting: Internal Medicine

## 2021-02-26 ENCOUNTER — Other Ambulatory Visit: Payer: Self-pay | Admitting: Neurosurgery

## 2021-02-26 ENCOUNTER — Other Ambulatory Visit: Payer: Self-pay | Admitting: Radiation Therapy

## 2021-02-26 ENCOUNTER — Ambulatory Visit: Payer: Medicaid Other | Admitting: Podiatry

## 2021-02-26 DIAGNOSIS — G928 Other toxic encephalopathy: Secondary | ICD-10-CM | POA: Diagnosis present

## 2021-02-26 DIAGNOSIS — Z20822 Contact with and (suspected) exposure to covid-19: Secondary | ICD-10-CM | POA: Diagnosis present

## 2021-02-26 DIAGNOSIS — R4182 Altered mental status, unspecified: Secondary | ICD-10-CM | POA: Diagnosis not present

## 2021-02-26 DIAGNOSIS — N183 Chronic kidney disease, stage 3 unspecified: Secondary | ICD-10-CM | POA: Diagnosis present

## 2021-02-26 DIAGNOSIS — R634 Abnormal weight loss: Secondary | ICD-10-CM | POA: Diagnosis present

## 2021-02-26 DIAGNOSIS — N390 Urinary tract infection, site not specified: Secondary | ICD-10-CM | POA: Diagnosis present

## 2021-02-26 DIAGNOSIS — I130 Hypertensive heart and chronic kidney disease with heart failure and stage 1 through stage 4 chronic kidney disease, or unspecified chronic kidney disease: Secondary | ICD-10-CM | POA: Diagnosis present

## 2021-02-26 DIAGNOSIS — G939 Disorder of brain, unspecified: Secondary | ICD-10-CM | POA: Diagnosis present

## 2021-02-26 DIAGNOSIS — E1165 Type 2 diabetes mellitus with hyperglycemia: Secondary | ICD-10-CM | POA: Diagnosis present

## 2021-02-26 DIAGNOSIS — E1143 Type 2 diabetes mellitus with diabetic autonomic (poly)neuropathy: Secondary | ICD-10-CM | POA: Diagnosis present

## 2021-02-26 DIAGNOSIS — H4921 Sixth [abducent] nerve palsy, right eye: Secondary | ICD-10-CM | POA: Diagnosis present

## 2021-02-26 DIAGNOSIS — I5032 Chronic diastolic (congestive) heart failure: Secondary | ICD-10-CM | POA: Diagnosis present

## 2021-02-26 DIAGNOSIS — I959 Hypotension, unspecified: Secondary | ICD-10-CM | POA: Diagnosis present

## 2021-02-26 DIAGNOSIS — I152 Hypertension secondary to endocrine disorders: Secondary | ICD-10-CM | POA: Diagnosis present

## 2021-02-26 DIAGNOSIS — H5462 Unqualified visual loss, left eye, normal vision right eye: Secondary | ICD-10-CM | POA: Diagnosis present

## 2021-02-26 DIAGNOSIS — E11319 Type 2 diabetes mellitus with unspecified diabetic retinopathy without macular edema: Secondary | ICD-10-CM | POA: Diagnosis present

## 2021-02-26 DIAGNOSIS — I639 Cerebral infarction, unspecified: Secondary | ICD-10-CM | POA: Diagnosis not present

## 2021-02-26 DIAGNOSIS — C799 Secondary malignant neoplasm of unspecified site: Secondary | ICD-10-CM | POA: Diagnosis present

## 2021-02-26 DIAGNOSIS — E1169 Type 2 diabetes mellitus with other specified complication: Secondary | ICD-10-CM | POA: Diagnosis present

## 2021-02-26 DIAGNOSIS — D509 Iron deficiency anemia, unspecified: Secondary | ICD-10-CM | POA: Diagnosis present

## 2021-02-26 DIAGNOSIS — F32A Depression, unspecified: Secondary | ICD-10-CM | POA: Diagnosis present

## 2021-02-26 DIAGNOSIS — G541 Lumbosacral plexus disorders: Secondary | ICD-10-CM | POA: Diagnosis present

## 2021-02-26 DIAGNOSIS — E1122 Type 2 diabetes mellitus with diabetic chronic kidney disease: Secondary | ICD-10-CM | POA: Diagnosis present

## 2021-02-26 DIAGNOSIS — N179 Acute kidney failure, unspecified: Secondary | ICD-10-CM | POA: Diagnosis present

## 2021-02-26 DIAGNOSIS — Z8616 Personal history of COVID-19: Secondary | ICD-10-CM | POA: Diagnosis not present

## 2021-02-26 DIAGNOSIS — I252 Old myocardial infarction: Secondary | ICD-10-CM | POA: Diagnosis not present

## 2021-02-26 LAB — CBG MONITORING, ED
Glucose-Capillary: 297 mg/dL — ABNORMAL HIGH (ref 70–99)
Glucose-Capillary: 169 mg/dL — ABNORMAL HIGH (ref 70–99)
Glucose-Capillary: 67 mg/dL — ABNORMAL LOW (ref 70–99)
Glucose-Capillary: 80 mg/dL (ref 70–99)
Glucose-Capillary: 147 mg/dL — ABNORMAL HIGH (ref 70–99)
Glucose-Capillary: 166 mg/dL — ABNORMAL HIGH (ref 70–99)

## 2021-02-26 LAB — BASIC METABOLIC PANEL
Anion gap: 7 (ref 5–15)
BUN: 31 mg/dL — ABNORMAL HIGH (ref 6–20)
CO2: 26 mmol/L (ref 22–32)
Calcium: 8.8 mg/dL — ABNORMAL LOW (ref 8.9–10.3)
Chloride: 101 mmol/L (ref 98–111)
Creatinine, Ser: 1.24 mg/dL — ABNORMAL HIGH (ref 0.44–1.00)
GFR, Estimated: 53 mL/min — ABNORMAL LOW (ref >60)
Glucose, Bld: 320 mg/dL — ABNORMAL HIGH (ref 70–99)
Potassium: 4.1 mmol/L (ref 3.5–5.1)
Sodium: 134 mmol/L — ABNORMAL LOW (ref 135–145)

## 2021-02-26 LAB — CBC
HCT: 32 % — ABNORMAL LOW (ref 36.0–46.0)
Hemoglobin: 10.3 g/dL — ABNORMAL LOW (ref 12.0–15.0)
MCH: 27.7 pg (ref 26.0–34.0)
MCHC: 32.2 g/dL (ref 30.0–36.0)
MCV: 86 fL (ref 80.0–100.0)
Platelets: 179 10*3/uL (ref 150–400)
RBC: 3.72 MIL/uL — ABNORMAL LOW (ref 3.87–5.11)
RDW: 12.4 % (ref 11.5–15.5)
WBC: 6.1 10*3/uL (ref 4.0–10.5)
nRBC: 0 % (ref 0.0–0.2)

## 2021-02-26 LAB — GLUCOSE, CAPILLARY: Glucose-Capillary: 216 mg/dL — ABNORMAL HIGH (ref 70–99)

## 2021-02-26 MED ORDER — INSULIN ASPART 100 UNIT/ML IJ SOLN: 0.0000 [IU] | Freq: Every day | INTRAMUSCULAR | Status: DC

## 2021-02-26 MED ORDER — ENSURE ENLIVE PO LIQD
296.0000 mL | Freq: Two times a day (BID) | ORAL | Status: DC | PRN
  Filled 2021-02-26: qty 474

## 2021-02-26 MED ORDER — INSULIN GLARGINE-YFGN 100 UNIT/ML ~~LOC~~ SOLN
10.0000 [IU] | Freq: Every day | SUBCUTANEOUS | Status: DC
Start: 2021-02-26 — End: 2021-02-27
  Administered 2021-02-26: 10 [IU] via SUBCUTANEOUS
  Filled 2021-02-26 (×3): qty 0.1

## 2021-02-26 MED ORDER — LACTATED RINGERS IV BOLUS
500.0000 mL | Freq: Once | INTRAVENOUS | Status: AC
  Administered 2021-02-26: 500 mL via INTRAVENOUS

## 2021-02-26 MED ORDER — ENSURE ENLIVE PO LIQD
237.0000 mL | Freq: Two times a day (BID) | ORAL | Status: DC | PRN
  Filled 2021-02-26: qty 237

## 2021-02-26 MED ORDER — INSULIN ASPART 100 UNIT/ML IJ SOLN
0.0000 [IU] | Freq: Three times a day (TID) | INTRAMUSCULAR | Status: DC
  Administered 2021-02-26: 8 [IU] via SUBCUTANEOUS

## 2021-02-26 MED ORDER — INSULIN ASPART 100 UNIT/ML IJ SOLN
0.0000 [IU] | Freq: Three times a day (TID) | INTRAMUSCULAR | Status: DC
  Administered 2021-02-26 (×2): 4 [IU] via SUBCUTANEOUS
  Administered 2021-02-27: 20 [IU] via SUBCUTANEOUS

## 2021-02-26 MED ORDER — ENSURE PRE-SURGERY PO LIQD: 296.0000 mL | Freq: Two times a day (BID) | ORAL | Status: DC | PRN

## 2021-02-26 NOTE — Evaluation (Signed)
Physical Therapy Evaluation Patient Details Name: Jennifer [name] MRN: [id number] DOB: [birth year] Today's Date: 02/26/2021  History of Present Illness  50 yo female presenting to ED on 11/6 with headache, nausea, and vomiting. MRI on 11/6 showing two adjacent enhancing lesions in the L occipital lobe. PMH including A fib, blindness of L eye Due to glaucoma and cataract, CKD stage 3, Closed trimalleolar fx of R ankle (05/02/2020), Clavicle fx (02/12/08), HTN, and DM.  Clinical Impression  Pt admitted secondary to problem above with deficits below. Pt requiring min A +2 for bed mobility and to stand at EOB for changing lower body dressing. Was unable to take steps and had episode of nausea/vomiting during session. Discussed use of WC at home with pt's son given current deficits and pt's son agreeable. Will continue to follow acutely.        Recommendations for follow up therapy are one component of a multi-disciplinary discharge planning process, led by the attending physician.  Recommendations may be updated based on patient status, additional functional criteria and insurance authorization.  Follow Up Recommendations Home health PT    Assistance Recommended at Discharge Frequent or constant Supervision/Assistance  Functional Status Assessment Patient has had a recent decline in their functional status and demonstrates the ability to make significant improvements in function in a reasonable and predictable amount of time.  Equipment Recommendations  None recommended by PT    Recommendations for Other Services       Precautions / Restrictions Precautions Precautions: Fall Restrictions Weight Bearing Restrictions: No      Mobility  Bed Mobility Overal bed mobility: Needs Assistance Bed Mobility: Supine to Sit;Sit to Supine     Supine to sit: Min assist Sit to supine: Min assist;+2 for physical assistance   General bed mobility comments: Required assist for trunk and LE. Increased  time required. Upon sitting, pt with nausea/vomiting.    Transfers Overall transfer level: Needs assistance Equipment used: 2 person hand held assist Transfers: Sit to/from Stand Sit to Stand: Min assist;+2 safety/equipment;+2 physical assistance           General transfer comment: Min A for lift assist and steadying to stand and change underwear. Pt unable to take many steps at EOB. Pt holding to bed and PT hand while OT assisted with lower body dressing and clean up.    Ambulation/Gait                  Stairs            Wheelchair Mobility    Modified Rankin (Stroke Patients Only)       Balance Overall balance assessment: Needs assistance Sitting-balance support: Feet supported;No upper extremity supported;Bilateral upper extremity supported;Single extremity supported Sitting balance-Leahy Scale: Fair     Standing balance support: Bilateral upper extremity supported;During functional activity Standing balance-Leahy Scale: Poor Standing balance comment: Reliant on BUE support                             Pertinent Vitals/Pain Pain Assessment: Faces Faces Pain Scale: Hurts even more Pain Location: head and neck Pain Descriptors / Indicators: Grimacing;Guarding Pain Intervention(s): Limited activity within patient's tolerance;Monitored during session;Repositioned    Home Living Family/patient expects to be discharged to:: Private residence Living Arrangements: Spouse/significant other;Children Available Help at Discharge: Family;Available 24 hours/day Type of Home: Apartment Home Access: Level entry       Home Layout: One level Home Equipment: Conservation officer, nature (2  wheels);Wheelchair - manual;BSC/3in1;Shower seat      Prior Function Prior Level of Function : Needs assist       Physical Assist : ADLs (physical)   ADLs (physical): Bathing;Dressing;IADLs (tub transfers) Mobility Comments: Uses RW for mobility; uses WC outside fo  home ADLs Comments: Required assist for tub transfers and for dressing     Hand Dominance   Dominant Hand: Right    Extremity/Trunk Assessment   Upper Extremity Assessment Upper Extremity Assessment: Defer to OT evaluation    Lower Extremity Assessment Lower Extremity Assessment: Generalized weakness (hx of R ankle surgery)    Cervical / Trunk Assessment Cervical / Trunk Assessment: Kyphotic  Communication   Communication: Prefers language other than English (prefers son to interpret)  Cognition Arousal/Alertness: Awake/alert Behavior During Therapy: WFL for tasks assessed/performed Overall Cognitive Status: Within Functional Limits for tasks assessed                                 General Comments: WFL for basic tasks. Difficult to assess secondary to language barrier        General Comments General comments (skin integrity, edema, etc.): Son present and educated about using WC for mobility tasks at home as they are wanting to take pt back home    Exercises     Assessment/Plan    PT Assessment Patient needs continued PT services  PT Problem List Decreased strength;Decreased range of motion;Decreased activity tolerance;Decreased balance;Decreased mobility;Decreased coordination;Decreased safety awareness;Cardiopulmonary status limiting activity       PT Treatment Interventions DME instruction;Gait training;Functional mobility training;Therapeutic activities;Therapeutic exercise;Balance training;Cognitive remediation;Patient/family education    PT Goals (Current goals can be found in the Care Plan section)  Acute Rehab PT Goals Patient Stated Goal: get stronger PT Goal Formulation: With patient/family Time For Goal Achievement: 03/12/21 Potential to Achieve Goals: Fair    Frequency Min 3X/week   Barriers to discharge        Co-evaluation PT/OT/SLP Co-Evaluation/Treatment: Yes Reason for Co-Treatment: For patient/therapist safety;To address  functional/ADL transfers PT goals addressed during session: Mobility/safety with mobility         AM-PAC PT "6 Clicks" Mobility  Outcome Measure Help needed turning from your back to your side while in a flat bed without using bedrails?: None Help needed moving from lying on your back to sitting on the side of a flat bed without using bedrails?: A Little Help needed moving to and from a bed to a chair (including a wheelchair)?: A Lot Help needed standing up from a chair using your arms (e.g., wheelchair or bedside chair)?: A Lot Help needed to walk in hospital room?: A Lot Help needed climbing 3-5 steps with a railing? : Total 6 Click Score: 14    End of Session Equipment Utilized During Treatment: Gait belt Activity Tolerance: Treatment limited secondary to medical complications (Comment) (nausea/vomiting) Patient left: in bed;with call bell/phone within reach;with family/visitor present (on stretcher in ED) Nurse Communication: Mobility status;Other (comment) (pt requesting nausea meds) PT Visit Diagnosis: Unsteadiness on feet (R26.81);Other abnormalities of gait and mobility (R26.89);Muscle weakness (generalized) (M62.81);Difficulty in walking, not elsewhere classified (R26.2)    Time: 4193-7902 PT Time Calculation (min) (ACUTE ONLY): 26 min   Charges:   PT Evaluation $PT Eval Moderate Complexity: 1 Mod          Reuel Derby, PT, DPT  Acute Rehabilitation Services  Pager: [phone] Office: [phone]   Rudean Hitt  02/26/2021, 12:38 PM

## 2021-02-26 NOTE — Consult Note (Signed)
Chief Complaint   Headache, nausea/vomiting  History of Present Illness  Jennifer [name] is a 50 y.o. female Initially seen in consultation in the emergency department after presenting with onset of relatively severe headache, with associated nausea and vomiting yesterday.  She is seen today with her son who helps translate as she is a native Production manager.   She has had episodes in the past of transient leg weakness for which she was admitted this past July.  When the headaches started yesterday, there was no associated changes in her vision or new numbness tingling or weakness of the extremities.    Of note, the patient does have significant medical comorbidities including atrial fibrillation on Eliquis.  According to her son and daughter, her last dose was likely this past Saturday, two days ago.  She also has a history of hypertension and diabetes and glaucoma with associated left eye blindness.  Of note, there is no known history of cancer.  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  She has not seen a dentist in about one year.  Dentist is  on [address].  No fevers chills or systemic symptoms.  Diabetes has been poorly controlled for some time.  She said it has improved recently averaging around 140.  Called the dentist said they would not see patients until May 14th but the urgency o   Pap smear abnormality of cervix with LGSIL    Routine/ritual circumcision    Type II diabetes mellitus ([place])    Uveitis     Past Surgical History   Past Surgical History:  Procedure Laterality Date   CESAREAN SECTION     x 1   EYE SURGERY Bilateral    Lasik   LEFT HEART CATH AND CORONARY ANGIOGRAPHY N/A 02/05/2021   Procedure: LEFT HEART CATH AND CORONARY ANGIOGRAPHY;  Surgeon: Dixie Dials, MD;  Location: [facility];  Service: Cardiovascular;  Laterality: N/A;   MULTIPLE TOOTH EXTRACTIONS     bottom denture   ORIF ANKLE FRACTURE Right 05/05/2020   Procedure: OPEN REDUCTION INTERNAL FIXATION (ORIF) ANKLE FRACTURE;  Surgeon: Shona Needles, MD;  Location: [facility];  Service: Orthopedics;  Laterality: Right;    Social History   Social History   Tobacco Use   Smoking status: Never   Smokeless tobacco: Never  Vaping Use   Vaping Use: Never used  Substance Use Topics   Alcohol use: No   Drug use: No    Medications   Prior to Admission medications   Medication Sig Start Date End Date Taking? Authorizing Provider  apixaban (ELIQUIS) 5 MG TABS tablet Take 1 tablet (5 mg total) by mouth 2 (two) times daily. 01/05/21  Yes Christian, Rylee, MD  gabapentin (NEURONTIN) 300 MG capsule Take 1 capsule (300 mg total) by mouth 3 (three) times daily. 01/05/21 01/05/22 Yes Christian, Rylee, MD  Accu-Chek FastClix Lancets MISC Check blood sugar 4 times a day 06/07/19   Jose Persia, MD  ACCU-CHEK GUIDE test strip CHECK BLOOD SUGAR 4 TIMES PER DAY 06/23/20   Mosetta Anis, MD  amLODipine (NORVASC) 2.5 MG tablet Take 1 tablet (2.5 mg total) by mouth 2 (two) times daily. 02/07/21   Jose Persia, MD  atorvastatin (LIPITOR) 40 MG tablet  Take 1 tablet (40 mg total) by mouth daily. 01/05/21   Mitzi Hansen, MD  Blood Glucose Monitoring Suppl (ACCU-CHEK GUIDE) w/Device KIT 1 each by Does not apply route 4 (four) times daily. 11/19/18   Jose Persia, MD  brimonidine (ALPHAGAN) 0.2 % ophthalmic solution Place 1 drop into the left eye every 8 (eight) hours. 08/01/20   [provider]  calcium citrate-vitamin D (CITRACAL+D) 315-200 MG-UNIT tablet Take 2 tablets by mouth 2 (two) times daily. 01/05/21 01/05/22  Mitzi Hansen, MD  diclofenac Sodium (VOLTAREN) 1 % GEL Apply 2 g topically 4 (four) times daily as needed (foot pain).    [provider]  dorzolamide-timolol (COSOPT) 22.3-6.8 MG/ML ophthalmic solution Place 1 drop into the left eye 2 (two) times daily. 10/16/20   [provider]  feeding supplement (ENSURE ENLIVE / ENSURE PLUS) LIQD Take 237 mLs by mouth 2 (two) times daily between meals. 02/06/21   Orvis Brill, MD  furosemide (LASIX) 20 MG tablet Please take 1 tablet daily as needed for swelling. 02/07/21   Virl Axe, MD  insulin aspart (NOVOLOG) 100 UNIT/ML FlexPen Inject 3 Units into the skin 3 (three) times daily with meals. 02/09/21 03/11/21  Virl Axe, MD  insulin detemir (LEVEMIR) 100 UNIT/ML FlexPen Inject 15 Units into the skin at bedtime 02/09/21 03/11/21  Virl Axe, MD  Insulin Pen Needle (B-D UF III MINI PEN NEEDLES) 31G X 5 MM MISC Use pen needle daily for injections 06/23/20   Mosetta Anis, MD  Insulin Pen Needle (PENTIPS) 32G X 4 MM MISC Use as directed with insulin pen 11/29/20   Harvie Heck, MD  linagliptin (TRADJENTA) 5 MG TABS tablet Take 1 tablet (5 mg total) by mouth daily. 02/09/21 03/11/21  Virl Axe, MD  losartan (COZAAR) 25 MG tablet Take 1 tablet (25 mg total) by mouth daily. 02/07/21   Orvis Brill, MD  metFORMIN (GLUCOPHAGE-XR) 500 MG 24 hr tablet TAKE 2 TABLETS (1,000 MG TOTAL) BY MOUTH IN THE MORNING AND AT BEDTIME. 01/23/21 02/22/21   Jose Persia, MD  metoCLOPramide (REGLAN) 5 MG tablet Take 1 tablet (5 mg total) by mouth 4 (four) times daily -  before meals and at bedtime. 11/29/20 12/29/20  Harvie Heck, MD  metoprolol tartrate (LOPRESSOR) 25 MG tablet Take 0.5 tablets (12.5 mg total) by mouth 2 (two) times daily. 02/07/21 03/09/21  Jose Persia, MD  Continuous Blood Gluc Sensor (DEXCOM G6 SENSOR) MISC Use to check blood sugar at least 6 times a day Patient not taking: Reported on 08/29/2020 08/09/20 09/27/20  Harvie Heck, MD  Continuous Blood Gluc Transmit (DEXCOM G6 TRANSMITTER) MISC Use to check blood sugar at least 6 times a day Patient not taking: Reported on 08/29/2020 08/09/20 09/27/20  Harvie Heck, MD    Allergies  No Known Allergies  Review  of Systems  ROS  Neurologic Exam  Awake, alert, oriented Memory and concentration grossly intact Speech fluent, appropriate CN grossly intact Motor exam: Upper Extremities Deltoid Bicep Tricep Grip  Right 5/5 5/5 5/5 5/5  Left 5/5 5/5 5/5 5/5   Lower Extremities IP Quad PF DF EHL  Right 5/5 5/5 5/5 5/5 5/5  Left 5/5 5/5 5/5 5/5 5/5   Sensation grossly intact to LT  Imaging  MRI of the brain with and without contrast was personally reviewed and demonstrates heterogeneously enhancing lesion of the left occipital lobe.  There is some high susceptibility signal suggesting blood products.  There is a small inferolaterally situated satellite lesion. There is associated edema.  No hydrocephalus.  Patient had previous MRI dated July 2022 which did not reveal any significant pathology.    CT of the chest abdomen and pelvis was also reviewed and is negative for identifiable systemic malignancy.  Impression  - 50 y.o. female   Presenting with new headache and nausea vomiting, otherwise neurologically at baseline with imaging revealing two heterogeneously enhancing left occipital lesions.  Malignancy certainly remains on the differential diagnosis, although inflammatory lesion  also possible.  Nonetheless, given the location  which would be amenable to surgical resection and lack of diagnosis,  I do think resection would be reasonable.    I have had the opportunity to review this patient's case at the multidisciplinary Neuro-Oncology conference this morning where consensus opinion was to proceed with surgical resection of the lesion.  Plan  -   Patient has been admitted to the medical service. -   Would hold anticoagulation for now -  we will plan on proceeding with stereotactic left occipital craniotomy for resection of these lesions in the next few days   I have reviewed the situation with the patient and her son at bedside.  I reviewed with them the imaging findings and the recommendation for surgical resection in order to obtain a diagnosis.  All their questions today were answered.   Consuella Lose, MD [facility]

## 2021-02-26 NOTE — Hospital Course (Addendum)
Still having headache but it has improved since admission.  Patient drowsy during encounter.  Daughter aware of something that they saw on MRI.  Team discussed imaging results and next steps.     03/01/21 NPO since midnight Patient states the catheter feels fine Anticipate procedure today ~11am Patient reports continued weakness and difficulty ambulating UA pending Give fluids or meds due to reduced urine output Possibly consult Nephrology Patient reports HA today; slightly improved from pervious day Neck pain has improved also Patient reports abdominal pain associated with anuria; since foley has been placed. Abd pain resolved. While sitting up in bed, patient reports more balance and strength than previous day. PE: Lungs clear

## 2021-02-26 NOTE — Evaluation (Signed)
Occupational Therapy Evaluation Patient Details Name: Jennifer [name] MRN: [id number] DOB: Sep 18, 1970 Today's Date: 02/26/2021   History of Present Illness 50 yo female presenting to ED on 11/6 with headache, nausea, and vomiting. MRI on 11/6 showing two adjacent enhancing lesions in the L occipital lobe. PMH including A fib, blindness of L eye Due to glaucoma and cataract, CKD stage 3, Closed trimalleolar fx of R ankle (05/02/2020), Clavicle fx (02/12/08), HTN, and DM.   Clinical Impression   PTA, pt was living with her husband and children and required assistance with dressing, bathing, and tub transfers pending her fatigue. Pt was using a RW after recent R ankle injury. Pt currently requiring Min A for UB ADLs, Min-Max A for LB ADLs, and Min A +2 for side steps towards HOB. Pt presenting with poor balance, strength, and activity tolerance. Pt also presenting with nausea (vomiting upon sitting at EOB) and photophobia. Pt would benefit from further acute OT to facilitate safe dc. Recommend dc to home with HHOT for further OT to optimize safety, independence with ADLs, and return to PLOF.      Recommendations for follow up therapy are one component of a multi-disciplinary discharge planning process, led by the attending physician.  Recommendations may be updated based on patient status, additional functional criteria and insurance authorization.   Follow Up Recommendations  Home health OT    Assistance Recommended at Discharge Frequent or constant Supervision/Assistance  Functional Status Assessment  Patient has had a recent decline in their functional status and demonstrates the ability to make significant improvements in function in a reasonable and predictable amount of time.  Equipment Recommendations  None recommended by OT    Recommendations for Other Services       Precautions / Restrictions Precautions Precautions: Fall Restrictions Weight Bearing Restrictions: No      Mobility  Bed Mobility Overal bed mobility: Needs Assistance Bed Mobility: Supine to Sit;Sit to Supine     Supine to sit: Min assist Sit to supine: Min assist;+2 for physical assistance   General bed mobility comments: Required assist for trunk and LE. Increased time required. Upon sitting, pt with nausea/vomiting.    Transfers Overall transfer level: Needs assistance Equipment used: 2 person hand held assist Transfers: Sit to/from Stand Sit to Stand: Min assist;+2 safety/equipment;+2 physical assistance           General transfer comment: Min A for lift assist and steadying to stand and change underwear. Pt unable to take many steps at EOB. Pt holding to bed and PT hand while OT assisted with lower body dressing and clean up.      Balance Overall balance assessment: Needs assistance Sitting-balance support: Feet supported;No upper extremity supported;Bilateral upper extremity supported;Single extremity supported Sitting balance-Leahy Scale: Fair     Standing balance support: Bilateral upper extremity supported;During functional activity Standing balance-Leahy Scale: Poor Standing balance comment: Reliant on BUE support                           ADL either performed or assessed with clinical judgement   ADL Overall ADL's : Needs assistance/impaired Eating/Feeding: Set up;Sitting   Grooming: Wash/dry hands;Min guard;Sitting   Upper Body Bathing: Minimal assistance;Sitting   Lower Body Bathing: Minimal assistance;Sit to/from stand   Upper Body Dressing : Minimal assistance;Sitting   Lower Body Dressing: Maximal assistance;Sit to/from stand Lower Body Dressing Details (indicate cue type and reason): Max A for changing underwear Toilet Transfer: Minimal assistance;+2 for  safety/equipment;Ambulation (side steps towards [facility])           Functional mobility during ADLs: Minimal assistance;+2 for safety/equipment;+2 for physical assistance General ADL Comments: Pt  presenting with decreased balance, strength, and cognition.     Vision Baseline Vision/History: 3 Glaucoma;4 Cataracts (Blind in L eye) Patient Visual Report: No change from baseline       Perception     Praxis      Pertinent Vitals/Pain Pain Assessment: Faces Faces Pain Scale: Hurts even more Pain Location: head and neck Pain Descriptors / Indicators: Grimacing;Guarding Pain Intervention(s): Monitored during session;Limited activity within patient's tolerance;Repositioned     Hand Dominance Right   Extremity/Trunk Assessment Upper Extremity Assessment Upper Extremity Assessment: Generalized weakness   Lower Extremity Assessment Lower Extremity Assessment: Defer to PT evaluation   Cervical / Trunk Assessment Cervical / Trunk Assessment: Kyphotic   Communication Communication Communication: Prefers language other than English (Arabic. Prefers son to interpret)   Cognition Arousal/Alertness: Awake/alert Behavior During Therapy: WFL for tasks assessed/performed Overall Cognitive Status: Impaired/Different from baseline Area of Impairment: Problem solving                             Problem Solving: Slow processing;Requires verbal cues General Comments: Difficult to assess secondary to language barrier. Moments where she demosntrated poor problem solving. Such as attempting to wipe her mouth with the vomit bag (but held a wash clothe in the other hand). Son cuing throughout to assist with problem solving issues     General Comments  Upon sitting at EOB, pt nauseous and vomitting several times - notified RN who provided medication. Son present and educated about using WC for mobility tasks at home as they are wanting to take pt back home    Exercises     Shoulder Instructions      Home Living Family/patient expects to be discharged to:: Private residence Living Arrangements: Spouse/significant other;Children Available Help at Discharge: Family;Available  24 hours/day Type of Home: Apartment Home Access: Level entry     Home Layout: One level     Bathroom Shower/Tub: Teacher, early years/pre: Handicapped height     Home Equipment: Conservation officer, nature (2 wheels);Wheelchair - manual;BSC/3in1;Shower seat          Prior Functioning/Environment Prior Level of Function : Needs assist       Physical Assist : ADLs (physical)   ADLs (physical): Bathing;Dressing;IADLs (tub transfers) Mobility Comments: Uses RW for mobility; uses WC outside fo home ADLs Comments: Required assist for tub transfers and for dressing        OT Problem List: Decreased range of motion;Decreased activity tolerance;Impaired balance (sitting and/or standing);Decreased knowledge of use of DME or AE;Decreased knowledge of precautions      OT Treatment/Interventions: Self-care/ADL training;DME and/or AE instruction;Therapeutic activities;Patient/family education    OT Goals(Current goals can be found in the care plan section) Acute Rehab OT Goals Patient Stated Goal: Go home with family OT Goal Formulation: With patient/family Time For Goal Achievement: 03/12/21 Potential to Achieve Goals: Good  OT Frequency: Min 2X/week   Barriers to D/C:            Co-evaluation PT/OT/SLP Co-Evaluation/Treatment: Yes Reason for Co-Treatment: For patient/therapist safety;To address functional/ADL transfers PT goals addressed during session: Mobility/safety with mobility OT goals addressed during session: ADL's and self-care      AM-PAC OT "6 Clicks" Daily Activity     Outcome Measure Help from another  person eating meals?: None Help from another person taking care of personal grooming?: A Little Help from another person toileting, which includes using toliet, bedpan, or urinal?: A Lot Help from another person bathing (including washing, rinsing, drying)?: A Lot Help from another person to put on and taking off regular upper body clothing?: A Little Help  from another person to put on and taking off regular lower body clothing?: A Lot 6 Click Score: 16   End of Session Nurse Communication: Mobility status (Pt vomiting)  Activity Tolerance: Patient tolerated treatment well Patient left: in bed;with call bell/phone within reach;with family/visitor present  OT Visit Diagnosis: Unsteadiness on feet (R26.81);Repeated falls (R29.6)                Time: 6160-7371 OT Time Calculation (min): 26 min Charges:  OT General Charges $OT Visit: 1 Visit OT Evaluation $OT Eval Moderate Complexity: [place], OTR/L Acute Rehab Pager: [phone] Office: [place] 02/26/2021, 2:13 PM

## 2021-02-26 NOTE — Progress Notes (Addendum)
Inpatient Diabetes Program Recommendations  AACE/ADA: New Consensus Statement on Inpatient Glycemic Control (2015)  Target Ranges:  Prepandial:   less than 140 mg/dL      Peak postprandial:   less than 180 mg/dL (1-2 hours)      Critically ill patients:  140 - 180 mg/dL   Lab Results  Component Value Date   GLUCAP 80 02/26/2021   HGBA1C 12.1 (H) 02/03/2021    Review of Glycemic Control Results for Jennifer [name], Jennifer [name] (MRN [id number]) as of 02/26/2021 11:08  Ref. Range 02/25/2021 21:46 02/26/2021 02:46 02/26/2021 04:47 02/26/2021 06:31 02/26/2021 06:46  Glucose-Capillary Latest Ref Range: 70 - 99 mg/dL 363 (H) 297 (H) Novolog 8 units 169 (H) Novolog 4 units 67 (L) 80   Diabetes history: DM2 Outpatient Diabetes medications: Levemir 15 units qd, Novolog 3 units tid meal coverage, Metformin 1 gm bid, Tradjenta 5 mg Current orders for Inpatient glycemic control: Novolog 0-20 units tid, Novolog 3 units tid meal coverage, Tradjenta 5 mg  Inpatient Diabetes Program Recommendations:   Noted patient had hypoglycemia of 67 post correction of -Novolog 8 units given 309 am -Novolog  4 units given 513 am  Please consider: -Decrease Novolog correction to 0-9 units tid + hs 0-5 units hs -Restart Semglee @ 10 units hs Secure chat sent to Dr. Scarlett Presto.  Thank you, Nani Gasser. Audine Mangione, RN, MSN, CDE  Diabetes Coordinator Inpatient Glycemic Control Team Team Pager [phone] (8am-5pm) 02/26/2021 11:14 AM

## 2021-02-26 NOTE — Progress Notes (Signed)
HD#0 Subjective:  Overnight Events: NAEO   Patient still complaining of a headache today, it hurts behind her eyes. She says that the pain is getting a little better but it is still there. Does not normally have headaches at home.   Objective:  Vital signs in last 24 hours: Vitals:   02/26/21 0500 02/26/21 0553 02/26/21 0615 02/26/21 1015  BP: (!) 117/58 (!) 95/54 (!) 98/53 (!) 154/75  Pulse: 69 66 62 76  Resp: 12 20 10 15   Temp:      TempSrc:      SpO2: 100% 100% 100% 96%   Supplemental O2: Room Air SpO2: 96 %   Physical Exam:  Constitutional: sleepy woman lying in bed with eyes closed, in no acute distress HENT: normocephalic atraumatic, mucous membranes moist Eyes: conjunctiva non-erythematous Neck: supple Cardiovascular: regular rate and rhythm, no m/r/g Pulmonary/Chest: normal work of breathing on room air, snoring intermittently Abdominal: soft, non-tender, non-distended MSK: normal bulk and tone Neurological: awakes to voice but went back to sleep mid interview. Answers questions appropriately when awake Skin: warm and dry Psych: normal affect  There were no vitals filed for this visit.  No intake or output data in the 24 hours ending 02/26/21 1046 Net IO Since Admission: No IO data has been entered for this period [02/26/21 1046]  Pertinent Labs: CBC Latest Ref Rng & Units 02/26/2021 02/25/2021 02/09/2021  WBC 4.0 - 10.5 K/uL 6.1 6.2 4.5  Hemoglobin 12.0 - 15.0 g/dL 10.3(L) 11.5(L) 10.0(L)  Hematocrit 36.0 - 46.0 % 32.0(L) 35.6(L) 31.1(L)  Platelets 150 - 400 K/uL 179 188 153    CMP Latest Ref Rng & Units 02/26/2021 02/25/2021 02/09/2021  Glucose 70 - 99 mg/dL 320(H) 377(H) 344(H)  BUN 6 - 20 mg/dL 31(H) 29(H) 14  Creatinine 0.44 - 1.00 mg/dL 1.24(H) 1.00 0.85  Sodium 135 - 145 mmol/L 134(L) 136 138  Potassium 3.5 - 5.1 mmol/L 4.1 4.1 4.1  Chloride 98 - 111 mmol/L 101 101 101  CO2 22 - 32 mmol/L 26 26 25   Calcium 8.9 - 10.3 mg/dL 8.8(L) 9.6 9.1  Total  Protein 6.5 - 8.1 g/dL - 7.3 -  Total Bilirubin 0.3 - 1.2 mg/dL - 1.1 -  Alkaline Phos 38 - 126 U/L - 86 -  AST 15 - 41 U/L - 15 -  ALT 0 - 44 U/L - 11 -    Imaging: CT HEAD WO CONTRAST (5MM)  Result Date: 02/25/2021 CLINICAL DATA:  Headache EXAM: CT HEAD WITHOUT CONTRAST TECHNIQUE: Contiguous axial images were obtained from the base of the skull through the vertex without intravenous contrast. COMPARISON:  CT head 01/29/2021 FINDINGS: Brain: Interval development of 2 cm hypodensity in the posterior parietal lobe on the left. Possible small area of associated hemorrhage. Low-density area appears involve cortex and white matter. No other areas of new low-density identified.  Mild atrophy. Vascular: Negative for hyperdense vessel Skull: Negative Sinuses/Orbits: Negative Other: None IMPRESSION: New hypodensity left posterior parietal lobe, not present 1 month ago. Possible small area of adjacent hemorrhage. Differential includes hemorrhagic infarct versus mass lesion. Recommend MRI brain without with contrast for further evaluation. These results were called by telephone at the time of interpretation on 02/25/2021 at 3:12 pm to provider MARGAUX VENTER , who verbally acknowledged these results. Electronically Signed   By: Franchot Gallo M.D.   On: 02/25/2021 15:12   CT Cervical Spine Wo Contrast  Result Date: 02/25/2021 CLINICAL DATA:  Metastatic disease evaluation EXAM: CT CERVICAL  SPINE WITHOUT CONTRAST TECHNIQUE: Multidetector CT imaging of the cervical spine was performed without intravenous contrast. Multiplanar CT image reconstructions were also generated. COMPARISON:  None. FINDINGS: Alignment: No static subluxation. Facets are aligned. Occipital condyles and the lateral masses of C1 and C2 are normally approximated. Skull base and vertebrae: No acute fracture. Soft tissues and spinal canal: No prevertebral fluid or swelling. No visible canal hematoma. Disc levels: No advanced spinal canal or neural  foraminal stenosis. Upper chest: No pneumothorax, pulmonary nodule or pleural effusion. Other: Normal visualized paraspinal cervical soft tissues. IMPRESSION: No acute fracture or static subluxation of the cervical spine. No osseous metastatic disease. Electronically Signed   By: Ulyses Jarred M.D.   On: 02/25/2021 19:32   MR BRAIN W WO CONTRAST  Result Date: 02/25/2021 CLINICAL DATA:  Brain mass or lesion. EXAM: MRI HEAD WITHOUT AND WITH CONTRAST TECHNIQUE: Multiplanar, multiecho pulse sequences of the brain and surrounding structures were obtained without and with intravenous contrast. CONTRAST:  73mL GADAVIST GADOBUTROL 1 MMOL/ML IV SOLN COMPARISON:  Head CT 02/25/2021 and MRI 11/07/2020 FINDINGS: Brain: A heterogeneously enhancing lesion posteriorly in the superior portion of the left occipital lobe measures 21 x 13 mm with mild surrounding edema. Associated susceptibility artifact is compatible with old blood products. A separate, smaller adjacent enhancing lesion more inferiorly in the left occipital lobe measures 7 mm. Small T2 hyperintensities elsewhere in the cerebral white matter bilaterally are unchanged from the prior MRI and are nonspecific but compatible with mild chronic small vessel ischemic disease. Cerebral atrophy is moderately advanced for age. Vascular: Major intracranial vascular flow voids are preserved. Skull and upper cervical spine: Unremarkable bone marrow signal. Sinuses/Orbits: Right cataract extraction.  Clear paranasal sinuses. Other: None. IMPRESSION: 1. Two adjacent enhancing lesions in the left occipital lobe with mild edema, indeterminate but suspicious for metastases. 2. Mild chronic small vessel ischemic disease and moderate cerebral atrophy. Electronically Signed   By: Logan Bores M.D.   On: 02/25/2021 18:05   CT CHEST ABDOMEN PELVIS W CONTRAST  Result Date: 02/25/2021 CLINICAL DATA:  Intracranial mass.  Search for metastatic disease EXAM: CT CHEST, ABDOMEN, AND PELVIS  WITH CONTRAST TECHNIQUE: Multidetector CT imaging of the chest, abdomen and pelvis was performed following the standard protocol during bolus administration of intravenous contrast. CONTRAST:  80mL OMNIPAQUE IOHEXOL 350 MG/ML SOLN COMPARISON:  None. FINDINGS: CT CHEST FINDINGS Cardiovascular: No significant vascular findings. Normal heart size. No pericardial effusion. Mediastinum/Nodes: No enlarged mediastinal, hilar, or axillary lymph nodes. Thyroid gland, trachea, and esophagus demonstrate no significant findings. Lungs/Pleura: Lungs are clear. No pleural effusion or pneumothorax. Musculoskeletal: No chest wall mass or suspicious bone lesions identified. CT ABDOMEN PELVIS FINDINGS Hepatobiliary: No focal liver abnormality is seen. No gallstones, gallbladder wall thickening, or biliary dilatation. Pancreas: Unremarkable. No pancreatic ductal dilatation or surrounding inflammatory changes. Spleen: No splenic injury or perisplenic hematoma. Adrenals/Urinary Tract: Adrenal glands are unremarkable. Kidneys are normal, without renal calculi, focal lesion, or hydronephrosis. Bladder is unremarkable. Stomach/Bowel: Stomach is within normal limits. Appendix appears normal. No evidence of bowel wall thickening, distention, or inflammatory changes. Vascular/Lymphatic: No significant vascular findings are present. No enlarged abdominal or pelvic lymph nodes. Reproductive: Uterus and bilateral adnexa are unremarkable. Other: No abdominal wall hernia or abnormality. No abdominopelvic ascites. Musculoskeletal: No acute or significant osseous findings. IMPRESSION: No evidence of malignancy in the chest, abdomen or pelvis. Mammography would be necessary to fully assess the breast tissue. Electronically Signed   By: Cletus Gash.D.  On: 02/25/2021 19:36    Assessment/Plan:   Principal Problem:   Brain lesion Active Problems:   AKI (acute kidney injury) ([place])   Weight loss, non-intentional   Autonomic dysfunction with  type 2 diabetes mellitus ([place])   Physical deconditioning   Patient Summary: Brigette Hopfer is a 50 y.o. 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   #Two enhancing lesions in the left occipital lobe with mild edema, suspicious for metastases #Concern for malignancy #Unintentional Weight loss Malignancy is still the leading differential at this time. No primary lesion has been identified thus far, will likely be unable to obtain a mammogram as an inpatient. Plan on doing a breast exam today however patient's daughter is not at bedside right now to interpret so will defer until she is back this afternoon. Had a conversation with the family over the phone that we are concerned Ms. Auerbach has cancer, though the expressed hope that it does not turn out to be cancer and that doctors have been concerned about many other problems with her in the past (I.e. heart, stroke, etc) and she turned out to be ok. Will continue discussions with the family who may benefit from palliative input as workup progresses. Will defer that referral at this time until it can be discussed with family. - f/u breast and skin exam - neurosurgery following appreciate assistance  #R Abducens Palsy #R Facial Droop Patient very sleepy on exam this morning, if these palsies persist in the afternoon will consider reaching out to neurology - recheck neuro exam in the afternoon   #Physical deconditioning Patient sleeping more with decreasing level of functioning over the past year.  - PT to eval and treat  #Poorly controlled insulin-dependent type 2 diabetes #Hyperglycemia #Diabetic neuropathy and diabetic  retinopathy #Concern for Gastroparesis Patient has history of poorly controlled diabetes, last hemoglobin A1c 10/16 was 12.1% with sugars in the 300s on admission. Had a low this morning of 67. Will back off long acting to 10 units nightly -Holding Metformin 1000 mg twice daily 2/2 AKI -Semglee 10 units nightly -NovoLog 3 units with meals -Sliding scale insulin resistant -Reglan 5 mg 3 times daily before meals and nightly -Tradjenta 5 mg daily -Gabapentin 300 mg 3 times daily -CBGs before meals and at bedtime   #Intermittent hypotension  #History of Hypertension with medication non-adherence #Suspected Autonomic Dysfunction Patient presented with blood pressures in the 200s in the ED, however several hours later as patient is sleeping blood pressures dropped as low as 80 systolic.  Pressures have improved this morning 11/7. Suspect diabetic autonomic neuropathy causing hyper and hypotension.  Patient has previously been prescribed midodrine for hypotension. - holding metoprolol and losartan secondary to hypotension - can consider re-addition of medications if pressures rise >180   #Prerenal AKI likely 2/2 GI fluid losses 3 AM labs with creatinine 1.24 and BUN 31 consistent with prerenal AKI likely secondary to GI fluid losses from vomiting and poor p.o. intake. - encourage PO intake - Avoid nephrotoxic agents - Daily BMP   #Atrial Fibrillation Not currently in A. fib.  Normal heart rate.  On Eliquis.  Intracranial bleeding risk secondary to brain lesions suspected mets. -Holding Eliquis  in the setting of increased bleeding risk -Continuous cardiac monitoring   #HFpEF Currently euvolemic on exam.  No SOB.  Last echo 10/16 with EF 60 to 65% and normal left ventricular function.  Grade 1 diastolic dysfunction.  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 Prescribed furosemide as needed for lower extremity swelling.  Not currently taking this medication.  No current indication for  diuretics.   #Nausea and Vomiting;resolved  Diet: Heart Healthy IVF: None,None VTE: SCDs Code: Full PT/OT recs: Pending, .   Prior to Admission Living Arrangement: home Anticipated Discharge Location: home Barriers to Discharge: Medical workup of brain lesion Dispo: Anticipated discharge to Home in 2-3 days pending medical workup.   Scarlett Presto, MD Internal Medicine Resident PGY-1 Pager [phone] Please contact the on call pager after 5 pm and on weekends at [phone].

## 2021-02-27 ENCOUNTER — Encounter: Payer: Self-pay | Admitting: Internal Medicine

## 2021-02-27 ENCOUNTER — Inpatient Hospital Stay (HOSPITAL_COMMUNITY): Payer: Medicaid Other

## 2021-02-27 ENCOUNTER — Other Ambulatory Visit: Payer: Self-pay

## 2021-02-27 LAB — CBC
HCT: 29.6 % — ABNORMAL LOW (ref 36.0–46.0)
Hemoglobin: 9.2 g/dL — ABNORMAL LOW (ref 12.0–15.0)
MCH: 27.5 pg (ref 26.0–34.0)
MCHC: 31.1 g/dL (ref 30.0–36.0)
MCV: 88.4 fL (ref 80.0–100.0)
Platelets: 160 10*3/uL (ref 150–400)
RBC: 3.35 MIL/uL — ABNORMAL LOW (ref 3.87–5.11)
RDW: 12.5 % (ref 11.5–15.5)
WBC: 6.5 10*3/uL (ref 4.0–10.5)
nRBC: 0 % (ref 0.0–0.2)

## 2021-02-27 LAB — GLUCOSE, CAPILLARY
Glucose-Capillary: 379 mg/dL — ABNORMAL HIGH (ref 70–99)
Glucose-Capillary: 94 mg/dL (ref 70–99)
Glucose-Capillary: 80 mg/dL (ref 70–99)
Glucose-Capillary: 298 mg/dL — ABNORMAL HIGH (ref 70–99)
Glucose-Capillary: 379 mg/dL — ABNORMAL HIGH (ref 70–99)

## 2021-02-27 LAB — BASIC METABOLIC PANEL
Anion gap: 8 (ref 5–15)
BUN: 50 mg/dL — ABNORMAL HIGH (ref 6–20)
CO2: 25 mmol/L (ref 22–32)
Calcium: 8.8 mg/dL — ABNORMAL LOW (ref 8.9–10.3)
Chloride: 100 mmol/L (ref 98–111)
Creatinine, Ser: 2.69 mg/dL — ABNORMAL HIGH (ref 0.44–1.00)
GFR, Estimated: 21 mL/min — ABNORMAL LOW (ref >60)
Glucose, Bld: 462 mg/dL — ABNORMAL HIGH (ref 70–99)
Potassium: 4.6 mmol/L (ref 3.5–5.1)
Sodium: 133 mmol/L — ABNORMAL LOW (ref 135–145)

## 2021-02-27 LAB — MAGNESIUM: Magnesium: 2 mg/dL (ref 1.7–2.4)

## 2021-02-27 MED ORDER — INSULIN GLARGINE-YFGN 100 UNIT/ML ~~LOC~~ SOLN
15.0000 [IU] | Freq: Every day | SUBCUTANEOUS | Status: DC
  Administered 2021-02-27 – 2021-03-04 (×5): 15 [IU] via SUBCUTANEOUS
  Filled 2021-02-27 (×7): qty 0.15

## 2021-02-27 MED ORDER — ADULT MULTIVITAMIN W/MINERALS CH
1.0000 | ORAL_TABLET | Freq: Every day | ORAL | Status: DC
  Administered 2021-02-28 – 2021-03-05 (×6): 1 via ORAL
  Filled 2021-02-27 (×6): qty 1

## 2021-02-27 MED ORDER — LACTATED RINGERS IV SOLN: INTRAVENOUS | Status: AC

## 2021-02-27 MED ORDER — GABAPENTIN 100 MG PO CAPS
100.0000 mg | ORAL_CAPSULE | Freq: Three times a day (TID) | ORAL | Status: DC
  Administered 2021-02-27 – 2021-02-28 (×3): 100 mg via ORAL
  Filled 2021-02-27 (×4): qty 1

## 2021-02-27 MED ORDER — ENSURE MAX PROTEIN PO LIQD
11.0000 [oz_av] | Freq: Every day | ORAL | Status: DC
  Administered 2021-02-27 – 2021-03-05 (×5): 11 [oz_av] via ORAL
  Filled 2021-02-27 (×7): qty 330

## 2021-02-27 MED ORDER — GLUCERNA SHAKE PO LIQD
237.0000 mL | Freq: Two times a day (BID) | ORAL | Status: DC
  Administered 2021-02-27 – 2021-03-05 (×9): 237 mL via ORAL

## 2021-02-27 MED ORDER — SIMETHICONE 80 MG PO CHEW
80.0000 mg | CHEWABLE_TABLET | Freq: Four times a day (QID) | ORAL | Status: DC | PRN
  Administered 2021-02-27 – 2021-03-03 (×2): 80 mg via ORAL
  Filled 2021-02-27 (×3): qty 1

## 2021-02-27 MED ORDER — INSULIN ASPART 100 UNIT/ML IJ SOLN
0.0000 [IU] | Freq: Three times a day (TID) | INTRAMUSCULAR | Status: DC
  Administered 2021-02-27: 8 [IU] via SUBCUTANEOUS
  Administered 2021-02-28 (×2): 3 [IU] via SUBCUTANEOUS
  Administered 2021-02-28: 5 [IU] via SUBCUTANEOUS
  Administered 2021-03-01: 2 [IU] via SUBCUTANEOUS
  Administered 2021-03-01: 3 [IU] via SUBCUTANEOUS
  Administered 2021-03-01 – 2021-03-02 (×3): 2 [IU] via SUBCUTANEOUS
  Administered 2021-03-02: 3 [IU] via SUBCUTANEOUS
  Administered 2021-03-03: 2 [IU] via SUBCUTANEOUS
  Administered 2021-03-03 – 2021-03-04 (×3): 3 [IU] via SUBCUTANEOUS
  Administered 2021-03-04: 5 [IU] via SUBCUTANEOUS
  Administered 2021-03-05: 3 [IU] via SUBCUTANEOUS
  Administered 2021-03-05: 2 [IU] via SUBCUTANEOUS

## 2021-02-27 NOTE — Progress Notes (Signed)
Initial Nutrition Assessment  DOCUMENTATION CODES:  Not applicable  INTERVENTION:  -Glucerna Shake po BID, each supplement provides 220 kcal and 10 grams of protein -Ensure Max po daily, each supplement provides 150 kcal and 30 grams of protein -MVI with minerals daily  NUTRITION DIAGNOSIS:  Inadequate oral intake related to nausea, poor appetite, vomiting as evidenced by per patient/family report.  GOAL:  Patient will meet greater than or equal to 90% of their needs  MONITOR:  PO intake, Supplement acceptance, Weight trends, Labs, I & O's  REASON FOR ASSESSMENT:  Malnutrition Screening Tool    ASSESSMENT:  Pt with PMH significant for HFpEF, type 2 DM, Afib, HLD, blindness of L eye 2/2 glaucoma, NSTEMI in 01/2021, and CKD stg 3 presented with headache and N/V. Pt found to have 2 adjacent enhancing lesions in the L occipital lobe with mild ed/concern for malignancy; pt admitted for further workup. Note pt's preferred language is Arabic and pt therefore needs interpreter, though pt/family prefer pt's daughters be used for interpretation as opposed to utilizing medical translation services due to privacy reasons.  Pt very fatigued/lethargic at time of RD visit and did not wake to RD voice/touch. No family at bedside. Per H&P, pt began experiencing symptoms on 11/06 and has since felt improvement with treatment. Per RN, pt denies nausea at this time and reports no emesis since day of admission. At time of admission, pt reported weighing 218lbs in Jan 2022 and now weighs ~150 lbs (if accurate, this would be a significant 31% weight loss in <1 year). RD unable to verify reported weight loss given pt's lethargy and limited available weight history (see below). Per last 10 encounters, pt weighed 72.1 kg on 08/22/20 and now weighs 68.1 kg, indicating a clinically insignificant 5.5% weight loss over 6 month span. Pt attributed weight loss to gastroparesis and poor appetite, though concern weight loss  is 2/2 malignancy.   Note pt's daughter indicated pt has been sleeping a lot more over the last several months and is often sleeping later into the day. Additionally, it was discovered that pt is noncompliant with medication regimen and will be untruthful about her lack of adherence so as to avoid getting in trouble.    No PO intake documented.   Pt known to Clinical Nutrition team from previous admissions and did well with Glucerna and Ensure Max supplementation. RD to provide supplements and follow-up for education needs and to obtain updated diet/weight history and NFPE once pt's family is available for interpretation.   UOP: 2x unmeasured occurrences x24 hours I/O: +264ml since admit  Medications: Oscal with D, SSI TID w/ meals, 15 units semglee daily, 5mg  tradjenta daily, reglan Labs: Na 133 (L), BUN 50 (H), Cr 2.69 (H) CBGs: 80-379 x 24 hours (Diabetes Coordinator following) A1c (taken 02/03/21): 12.1 (H)  NUTRITION - FOCUSED PHYSICAL EXAM: deferred at this time; will attempt at follow-up provided consent can be obtained   Diet Order:   Diet Order             Diet heart healthy/carb modified Room service appropriate? Yes; Fluid consistency: Thin  Diet effective now                  EDUCATION NEEDS:  Not appropriate for education at this time  Skin:  Skin Assessment: Reviewed RN Assessment  Last BM:  11/8 type 7  Height:  Ht Readings from Last 1 Encounters:  02/27/21 5\' 6"  (1.676 m)   Weight:  Wt Readings  from Last 10 Encounters:  02/27/21 68.1 kg  02/09/21 69.7 kg  02/05/21 74 kg  01/04/21 70.8 kg  11/23/20 71.2 kg  11/22/20 69.3 kg  11/07/20 71 kg  09/01/20 71 kg  08/22/20 72.1 kg  08/09/20 74.5 kg   BMI:  Body mass index is 24.23 kg/m.  Estimated Nutritional Needs:  Kcal:  2050-2250 Protein:  100-115 grams Fluid:  >2L/d    Larkin Ina, MS, RD, LDN (she/her/hers) RD pager number and weekend/on-call pager number located in [place].

## 2021-02-27 NOTE — Progress Notes (Signed)
Inpatient Diabetes Program Recommendations  AACE/ADA: New Consensus Statement on Inpatient Glycemic Control   Target Ranges:  Prepandial:   less than 140 mg/dL      Peak postprandial:   less than 180 mg/dL (1-2 hours)      Critically ill patients:  140 - 180 mg/dL   Results for Jennifer [name], Jennifer [name] (MRN [id number]) as of 02/27/2021 09:17  Ref. Range 02/26/2021 06:46 02/26/2021 11:18 02/26/2021 17:01 02/26/2021 21:42 02/27/2021 06:44  Glucose-Capillary Latest Ref Range: 70 - 99 mg/dL 80 147 (H) 166 (H) 216 (H) 379 (H)    Review of Glycemic Control  Diabetes history: DM2 Outpatient Diabetes medications: Levemir 15 units daily, Novolog 3 units TID with meals, Metformin 1000 mg BID, Tradjenta 5 mg daily Current orders for Inpatient glycemic control: Semglee 10 units daily, Novolog 0-20 units TID with meals, Tradjenta 5 mg daily  Inpatient Diabetes Program Recommendations:    Insulin: Please consider increasing Semglee to 15 units daily, decreasing Novolog correction to 0-15 units TID with meals, adding Novolog 0-5 units QHS for bedtime correction. If post prandial glucose is consistently over 180 mg/dl, please consider adding Novolog 2 units TID with meals for meal coverage if patient eats at least 50% of meals.  Thanks, Barnie Alderman, RN, MSN, CDE Diabetes Coordinator Inpatient Diabetes Program [phone] (Team Pager from 8am to 5pm)

## 2021-02-27 NOTE — Progress Notes (Signed)
Physical Therapy Treatment Patient Details Name: Jennifer [name] MRN: [id number] DOB: 06/08/1970 Today's Date: 02/27/2021   History of Present Illness 50 yo female presenting to ED on 11/6 with headache, nausea, and vomiting. MRI on 11/6 showing two adjacent enhancing lesions in the L occipital lobe. PMH including A fib, blindness of L eye Due to glaucoma and cataract, CKD stage 3, Closed trimalleolar fx of R ankle (05/02/2020), Clavicle fx (02/12/08), HTN, and DM.    PT Comments    Patient progressing slowly due to new ataxia this date.  She was able to pivot OOB to chair, then stand with RW and step 2 steps to bed, but intermittent ataxic movements noted in sitting and standing.  More alert this pm after solemnent this am.  Patient will continue to benefit from skilled PT in the acute setting.  Follow up HHPT with family assist planned at d/c.    Recommendations for follow up therapy are one component of a multi-disciplinary discharge planning process, led by the attending physician.  Recommendations may be updated based on patient status, additional functional criteria and insurance authorization.  Follow Up Recommendations  Home health PT     Assistance Recommended at Discharge Frequent or constant Supervision/Assistance  Equipment Recommendations  None recommended by PT    Recommendations for Other Services       Precautions / Restrictions Precautions Precautions: Fall Precaution Comments: ataxia     Mobility  Bed Mobility Overal bed mobility: Needs Assistance Bed Mobility: Supine to Sit;Sit to Supine     Supine to sit: Min assist Sit to supine: Min assist   General bed mobility comments: assist for trunk to upright using rails, assist to supine for positioning and safety with lines    Transfers Overall transfer level: Needs assistance Equipment used: Rolling walker (2 wheels);None Transfers: Bed to chair/wheelchair/BSC;Sit to/from Stand Sit to Stand: Mod assist Stand  pivot transfers: Mod assist Squat pivot transfers: Mod assist       General transfer comment: up to [facility] to sit while PT changed bed linen as soiled from IV leaking, RN in to discontinue IV; pt sit to stand to RW with lifting help then stepping to EOB A for balance with ataxia    Ambulation/Gait                   Stairs             Wheelchair Mobility    Modified Rankin (Stroke Patients Only)       Balance Overall balance assessment: Needs assistance   Sitting balance-Leahy Scale: Fair Sitting balance - Comments: occasional trunk wobble with ataxia, but no LOB or need for UE support   Standing balance support: Bilateral upper extremity supported Standing balance-Leahy Scale: Poor Standing balance comment: UE support in standing                            Cognition Arousal/Alertness: Awake/alert Behavior During Therapy: WFL for tasks assessed/performed Overall Cognitive Status: Difficult to assess                                 General Comments: wanting to wear underwear, but soiled them as soon as she put them on, then wanting to try again, educated best not to wear them now given her current status        Exercises  General Comments General comments (skin integrity, edema, etc.): daughter present and interpreting for pt; Patient wanting to wear underwear so donned while on [facility] and daughter assisted to pull up with PT assisting sit to stand, once supine doffed due to soiled and assist for hygiene after on bed pan      Pertinent Vitals/Pain Faces Pain Scale: Hurts little more Pain Location: posterior head and neck Pain Descriptors / Indicators: Discomfort Pain Intervention(s): Monitored during session    Home Living                          Prior Function            PT Goals (current goals can now be found in the care plan section) Progress towards PT goals: Progressing toward goals (slowly)     Frequency    Min 3X/week      PT Plan Current plan remains appropriate    Co-evaluation              AM-PAC PT "6 Clicks" Mobility   Outcome Measure  Help needed turning from your back to your side while in a flat bed without using bedrails?: A Little Help needed moving from lying on your back to sitting on the side of a flat bed without using bedrails?: A Lot Help needed moving to and from a bed to a chair (including a wheelchair)?: A Lot Help needed standing up from a chair using your arms (e.g., wheelchair or bedside chair)?: A Lot Help needed to walk in hospital room?: Total Help needed climbing 3-5 steps with a railing? : Total 6 Click Score: 11    End of Session   Activity Tolerance: Patient tolerated treatment well Patient left: in bed;with bed alarm set;with call bell/phone within reach;with family/visitor present   PT Visit Diagnosis: Unsteadiness on feet (R26.81);Other abnormalities of gait and mobility (R26.89);Muscle weakness (generalized) (M62.81);Difficulty in walking, not elsewhere classified (R26.2)     Time: 6381-7711 PT Time Calculation (min) (ACUTE ONLY): 34 min  Charges:  $Therapeutic Activity: 23-37 mins                     Jennifer [name], PT Acute Rehabilitation Services AFBXU:[phone] Office:[phone] 02/27/2021    [name] 02/27/2021, 5:17 PM

## 2021-02-27 NOTE — Patient Outreach (Signed)
Care Coordination  02/27/2021  Ilani Otterson 13-Jun-1970 [id number]  Danee Soller is currently admitted as an inpatient at [place] team will follow the progress of Denaly Gatling and follow up upon discharge.   [name] RN, BSN [place]  [facility] - Managed Medicaid High Risk [phone].

## 2021-02-27 NOTE — Consult Note (Signed)
Neurology Consultation  Reason for Consult: ataxic episode this a.m. if face of a brain tumor.  Referring Physician: Lenise Herald, MD.   CC: change in status this a.m.   History is obtained from: patient and daughter at bedside who acts as interpretor.   HPI: Earnestine Shipp is a 50 y.o. female with a PMHx of CdHF, IDDM II, AF on Eliquis (on hold), NSTEMI 10/22, CKD III, glaucoma, blindness OS, anemia, COVID, HLD, and HTN who presented 2 days ago with acute HA and n/v. CTH showed 2 lesions in the Left occipital lobe thought to be metastasis, primary unknown.   Per resident's H&P on 02/25/21-"Patient reports 9AM this morning she had a gradual onset headache with photophobia.  Headache affecting both sides of her head and into her eyes, no 1 spot that is the most painful.  No improvement with Tylenol at home.  Has not had a headache like this prior to today.  Patient has also had associated nausea with vomiting.  Patient has vomited a few times today, last vomited on arrival to the ED.  Headache 9/10 at its worst earlier today, currently headache is 4/10 without photophobia.  Patient currently not having any nausea and no further vomiting after receiving Zofran in the ED.  No recent visual changes, worsening weakness, difficulty swallowing, changes in speech.  Patient denies breast pain, breast lumps, skin changes overlying breast, breast discharge.  She denies abdominal pain, hematochezia.  Had diarrhea last few days which she thinks is from one of her medications.  She has had significant weight loss in the last year, weight 218 pounds in January and now weighs 150 pounds.  Weight loss thought to be due to gastroparesis and poor appetite per chart review.  She denies fevers, has occasional night sweats/chills.  No new suspicious skin lesions per patient and daughter. Denies current dental pain.  LMP 1.5 years ago without abnormal uterine bleeding.  Has intermittent fecal incontinence.  Has ambulated  significantly less around the house following right ankle injury in January 2022.  Has needed assistance with ambulation, dragging right foot.  Daughter reports patient is sleeping to the day for several months. Daughter reports patient has not been adherent to medications.  Daughter reports patient is taking her insulin and checking her blood sugars at home.  However while going through medications at bedside, patient has not taken gabapentin, amlodipine, midodrine, metoprolol, Tradjenta.  Intermittently takes Eliquis. daughter reports patient will be untruthful about medication adherence so that she does not get in trouble. Recently been wearing depends, has intermittent fecal incontinence."  Workup thus far has shown enhancing lesions left occipital lobe on MRI b. BP 200s on arrival. CBC with mild anemia, high glucose, and a few bacteria on UA. Her Eliquis for AF has been held due to suspicion for metastases. CT chest, abdomen, and pelvis without concerns for primary carcinoma, but mammography is recommended. No metastatic disease in Cervical spine. + right lateral gaze deficit as well as right facial droop noted. Neurosurgery has consulted and patient to be taken to OR 02/28/21 for craniotomy and resection of lesions.   This a.m., patient had acute change with lethargy, neck pain, and right sided HA. Her posture was weak with falling to the right. She was unable to sit up alone. Daughter states her neck was wobbly/shaky. She was uncomfortable appearing. Stat CTH was without changes from prior imaging.   In review of chart, patient had a fever of 101.63f at time of event. Her O2  sat was normal on RA. CXR negative for acute infiltrate. Blood cultures are pending. UA is pending.   Neurology was asked to consult for ataxic event this a.m. The patient states she is feeling better now. Still has mild weakness when trying to pull herself up in bed, but no neck pain, leaning, or HA. Had Tylenol and it helped. No  vision changes over baseline. No speech disturbance. No dysphagia. No weakness, numbness, or tingling of face or particular limb.   ROS: A robust ROS was performed and is negative except as noted in the HPI.   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  She has not seen a dentist in about one year.  Dentist is on [address].  No fevers chills or systemic symptoms.  Diabetes has been poorly controlled for some time.  She said it has improved recently averaging around 140.  Called the dentist said they would not see patients until May 14th but the urgency o   Pap smear abnormality of cervix with LGSIL    Routine/ritual circumcision    Type II diabetes mellitus ([facility])    Uveitis    Family History  Problem Relation Age of Onset   Diabetes Father    Hypertension Other    Social History:   reports that she has never smoked. She has never used smokeless tobacco. She reports that she does not drink alcohol and does not use drugs.  Medications  Current Facility-Administered Medications:    acetaminophen (TYLENOL) tablet 650 mg, 650 mg, Oral, Q6H PRN, 650 mg at  02/27/21 1152 **OR** acetaminophen (TYLENOL) suppository 650 mg, 650 mg, Rectal, Q6H PRN, Rehman, Areeg N, DO   atorvastatin (LIPITOR) tablet 40 mg, 40 mg, Oral, Daily, Rehman, Areeg N, DO, 40 mg at 02/27/21 0845   brimonidine (ALPHAGAN) 0.2 % ophthalmic solution 1 drop, 1 drop, Left Eye, Q8H, Rehman, Areeg N, DO, 1 drop at 02/27/21 1241   calcium-vitamin D (OSCAL WITH D) 500MG -200UNIT (5MCG) per tablet 1 tablet, 1 tablet, Oral, BID WC, Heloise Purpura, RPH, 1 tablet at 02/27/21 0845   diclofenac Sodium (VOLTAREN) 1 % topical gel 2 g, 2 g, Topical, QID PRN, Rehman, Areeg N, DO   dorzolamide-timolol (COSOPT) 22.3-6.8 MG/ML ophthalmic solution 1 drop, 1 drop, Left Eye, BID, Rehman, Areeg N, DO, 1 drop at 02/27/21 0845   feeding supplement (ENSURE ENLIVE / ENSURE PLUS) liquid 237 mL, 237 mL, Oral, BID BM & HS PRN, Angelica Pou, MD   feeding supplement (GLUCERNA SHAKE) (GLUCERNA SHAKE) liquid 237 mL, 237 mL, Oral, BID BM, Angelica Pou, MD, 237 mL  at 02/27/21 1242   gabapentin (NEURONTIN) capsule 300 mg, 300 mg, Oral, TID, Rehman, Areeg N, DO, 300 mg at 02/27/21 0845   insulin aspart (novoLOG) injection 0-15 Units, 0-15 Units, Subcutaneous, TID WC, Demaio, Alexa, MD   insulin glargine-yfgn (SEMGLEE) injection 15 Units, 15 Units, Subcutaneous, QHS, Demaio, Alexa, MD   lactated ringers infusion, , Intravenous, Continuous, Demaio, Alexa, MD, Last Rate: 100 mL/hr at 02/27/21 1238, New Bag at 02/27/21 1238   linagliptin (TRADJENTA) tablet 5 mg, 5 mg, Oral, Daily, Rehman, Areeg N, DO, 5 mg at 02/27/21 0845   metoCLOPramide (REGLAN) tablet 5 mg, 5 mg, Oral, TID AC & HS, Rehman, Areeg N, DO, 5 mg at 02/27/21 1152   multivitamin with minerals tablet 1 tablet, 1 tablet, Oral, Daily, Angelica Pou, MD   ondansetron [facility]) tablet 4 mg, 4 mg, Oral, Q6H PRN **OR** ondansetron (ZOFRAN) injection 4 mg, 4 mg, Intravenous, Q6H PRN, Rehman, Areeg N, DO, 4 mg at 02/26/21 1108   protein supplement  (ENSURE MAX) liquid, 11 oz, Oral, Daily, Angelica Pou, MD  Exam: Current vital signs: BP 130/65   Pulse 69   Temp 98.3 F (36.8 C) (Oral)   Resp 14   Ht 5\' 6"  (1.676 m)   Wt 68.1 kg   LMP 08/21/2016 (Exact Date)   SpO2 94%   BMI 24.23 kg/m  Vital signs in last 24 hours: Temp:  [98 F (36.7 C)-101.2 F (38.4 C)] 98.3 F (36.8 C) (11/08 1245) Pulse Rate:  [69-84] 69 (11/08 1138) Resp:  [9-21] 14 (11/08 1350) BP: (92-146)/(48-73) 130/65 (11/08 1350) SpO2:  [94 %-100 %] 94 % (11/08 1350) Weight:  [68.1 kg] 68.1 kg (11/08 0045)  PE: GENERAL: Fairly well appearing female in NAD. Non toxic appearing. Awake and alert.   HEENT: normocephalic and atraumatic. LUNGS - Normal respiratory effort.  CV - RRR on tele. ABDOMEN - Soft, nontender. Ext: warm, well perfused. Psych: affect light.   NEURO:  Mental Status: Alert and oriented. Speech/Language: speech is without dysarthria or aphasia. Follows commands.  Naming, repetition, fluency, and comprehension intact.  Cranial Nerves:  II: OD 71mm and reactive. OS cataract/blindness.  III, IV, VI: EOMI. Right lateral gaze deficit.  V: sensation is intact and symmetrical to face.  VII: Mild right sided facial droop.   VIII:hearing intact to voice. IX, X: palate elevation is symmetric. Phonation normal.  XI: normal sternocleidomastoid and trapezius muscle strength. OZD:GUYQIH is symmetrical without fasciculations.   Motor:  RUE: grips  4-/5       triceps 4-/5     biceps  4-/5      LUE: grips  5/5      triceps  5/5      biceps   5/5 RLE: She is unable to lift her RLE off the bed more than a few inches and it drifts back to bed.  LLE:  Able to lift and hold without drift.  Hypotonal. Bulk is normal.  Sensation- Intact to light touch bilaterally in all four extremities. Extinction absent to DSS.  Coordination: FTN intact bilaterally. HKS intact bilaterally. No pronator drift UEs. However, when holding up her arms, she will drop  RUE about 1 inch from time to time and says it is painful in her elbow and wrist.   Gait- deferred.  On attending evaluation, patient was resting comfortably and was not awoken given late hour.  Labs I have reviewed labs in epic and the results pertinent to this consultation are:  CBC  Component Value Date/Time   WBC 6.5 02/27/2021 0639   RBC 3.35 (L) 02/27/2021 0639   HGB 9.2 (L) 02/27/2021 0639   HGB 10.0 (L) 02/09/2021 1137   HCT 29.6 (L) 02/27/2021 0639   HCT 31.1 (L) 02/09/2021 1137   PLT 160 02/27/2021 0639   PLT 153 02/09/2021 1137   MCV 88.4 02/27/2021 0639   MCV 86 02/09/2021 1137   MCH 27.5 02/27/2021 0639   MCHC 31.1 02/27/2021 0639   RDW 12.5 02/27/2021 0639   RDW 11.9 02/09/2021 1137   LYMPHSABS 1.3 02/25/2021 1331   LYMPHSABS 1.6 02/09/2021 1137   MONOABS 0.2 02/25/2021 1331   EOSABS 0.1 02/25/2021 1331   EOSABS 0.2 02/09/2021 1137   BASOSABS 0.0 02/25/2021 1331   BASOSABS 0.0 02/09/2021 1137   CMP     Component Value Date/Time   NA 133 (L) 02/27/2021 0639   NA 138 02/09/2021 1137   K 4.6 02/27/2021 0639   CL 100 02/27/2021 0639   CO2 25 02/27/2021 0639   GLUCOSE 462 (H) 02/27/2021 0639   BUN 50 (H) 02/27/2021 0639   BUN 14 02/09/2021 1137   CREATININE 2.69 (H) 02/27/2021 0639   CREATININE 0.63 10/28/2014 1649   CALCIUM 8.8 (L) 02/27/2021 0639   PROT 7.3 02/25/2021 1331   PROT 6.8 01/30/2018 1440   ALBUMIN 3.3 (L) 02/25/2021 1331   ALBUMIN 3.6 01/30/2018 1440   AST 15 02/25/2021 1331   ALT 11 02/25/2021 1331   ALKPHOS 86 02/25/2021 1331   BILITOT 1.1 02/25/2021 1331   BILITOT <0.2 01/30/2018 1440   GFRNONAA 21 (L) 02/27/2021 0639   GFRNONAA >89 10/28/2014 1649   GFRAA 75 04/19/2020 1555   GFRAA >89 10/28/2014 1649   Imaging MD reviewed the images obtained.  CT head 02/25/21 -New hypodensity left posterior parietal lobe, not present 1 month ago. Possible small area of adjacent hemorrhage. Differential includes hemorrhagic infarct versus  mass lesion. Recommend MRI brain without with contrast for further evaluation.  CT head 02/27/21 -Unchanged size of left occipital lobe lesion with slightly decreased surrounding edema. -No evidence of new intracranial abnormality.    MRI brain 02/25/21 -Two adjacent enhancing lesions in the left occipital lobe with mild edema, indeterminate but suspicious for metastases. -Mild chronic small vessel ischemic disease and moderate cerebral atrophy.  MRI brain 02/27/21 Pending.   Assessment: 50 yo Arabic speaking female who was admitted on 02/25/21 with HA and n/v. MRI b showed 2 enhancing lesions in the left occipital lobe which were felt to resemble metastases with unknown primary. Patient had an acute spell this am of HA, neck pain, neck weakness, and truncal weakness. This has all resolved. She continues to exhibit RU/RLE weakness which is likely the brain lesions and deconditioning. The event is likely related to her known brain lesions coupled with her severe deconditioning. Repeat MRI b is reasonable and has been ordered by [facility]. Given her fever and worsening creatinine, this event is likely attributable to toxic metabolic causes along with her severe deconditioning.  Additionally on the differential is transient dystonic reaction due to her metoclopramide. Less likely based on described seimology but possible given her cortical lesions is focal seizure; will obtain EEG for evaluation  Impression: -episode of ataxia with HA and neck pain. Doubt stroke.  -physical deconditioning.  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 infectious/inflammatory process   Recommendations/Plan:  -Continue to monitor patient carefully from a neurological perspective.  -Agree with r/p MRI brain.  -Agree with holding  of Eliquis for now.  -f/up infectious workup.  -Continue to correct and treat toxic/metabolic etiologies for her presentation as you are doing.  -Continue PT -Discontinue  metoclopramide at this time -Routine EEG -Neurology will follow along tomorrow  Pt seen by Clance Boll, NP/Neuro and later by MD. Note/plan to be edited by MD as needed.  Pager: [phone]  Attending Neurologist's note:  I personally saw this patient, gathering history, performing an examination, reviewing relevant labs, personally reviewing relevant imaging including, and formulated the assessment and plan, adding the note above for completeness and clarity to accurately reflect my thoughts  Lesleigh Noe MD-PhD [facility] [phone]

## 2021-02-27 NOTE — Progress Notes (Unsigned)
Internal medicine attending rounding note    [name] was seen together on rounds with Dr. Ileene Musa.  Interpretation was provided by husband who was in the room, and by her daughter who is on the telephone.  There has been a change in status in the last several hours.  She has been more drowsy, is describing a severe headache primarily on the right side and extending down the back of her right neck, feels nauseated without vomiting, and is having difficulty with coordination.  On examination she is able to answer questions but does appear very tired and closes her eyes when not stimulated.  She exhibits frequent myoclonic jerks.  Difficulty sitting up and holding her posture, and difficulty holding her arms in stable position.  She has developed acute kidney injury overnight, and was just recently documented to have a fever of 101.2.  Limbs are warm and well-perfused.  Heart regular rate and rhythm, extra heart sound noted, soft systolic ejection murmur over the right upper sternal border.  Breathing is nonlabored and oxygenation has been normal.  Her abdomen is soft and nontender.  There have been no abnormal skin findings.  She is able to move all extremities.  Differential for her to left occipital lesions includes metastatic malignancy (breast exam normal, has not had mammogram, no breast cancer in family; CT of chest abdomen and pelvis is normal; has not had colonoscopy) and inflammatory or infectious.  Stat CT of the brain has been ordered, query for hemorrhage, change in edema, or change in her lesions.  Infectious work-up including blood cultures, urine culture, and chest x-ray underway.  Neurology team has been consulted.  We are wondering whether empiric steroids (if we are leaning towards tumor) or antibiotics (if we are leaning toward infection) should be entertained.  Advice on empiric antiepileptics would be welcomed as well.  Neurosurgery will be notified of change in status, as sampling of  lesions sooner rather than later (as long as she is clinically stable enough) would help expedite treatment plan.  Will discontinue metoclopromide to avoid any extrapyramidal side effects from interfering with neuro exam.

## 2021-02-27 NOTE — Progress Notes (Signed)
PT Cancellation Note  Patient Details Name: Prajna Vanderpool MRN: [id number] DOB: 1971/04/05   Cancelled Treatment:    Reason Eval/Treat Not Completed: Fatigue/lethargy limiting ability to participate; too sleepy, unable to rouse even with cool cloth to face.  Will attempt later as schedule permits and pt able.    [name] 02/27/2021, 10:29 AM Magda Kiel, PT [facility] IHKVQ:[phone] Office:[phone] 02/27/2021

## 2021-02-27 NOTE — Progress Notes (Signed)
HD#1 Subjective:  Overnight Events: NAEO  Patient was seen at about 7:30 AM and was resting comfortably, but had an acute change late this morning, daughter mentioned that she seemed less alert. On reexamination around 12 she was complaining about her headache and saying that her neck hurt, pointing to the right side of her head. She is also endorsing difficulty sitting up in bed. She was uncomfortable appearing which was a marked change from this morning.  Objective:  Vital signs in last 24 hours: Vitals:   02/27/21 0045 02/27/21 0245 02/27/21 0445 02/27/21 0715  BP: (!) 110/52 (!) 110/58 95/73   Pulse: 73     Resp: (!) 21 11 (!) 21 15  Temp: 98.2 F (36.8 C)     TempSrc: Oral     SpO2: 98% 99% 100%   Weight: 68.1 kg     Height: 5\' 6"  (1.676 m)      Supplemental O2: Room Air SpO2: 100 %   Physical Exam:  Constitutional: Uncomfortable woman complaining about neck pain. HENT: normocephalic atraumatic, no appreciable neck swelling, pain to palpation, or skin changes Eyes: conjunctiva non-erythematous Neck: supple Cardiovascular: regular rate and rhythm, extra heart sound  Breast: no breast or axillary lumps, masses, or skin changes appreciated Pulmonary/Chest: normal work of breathing on room air, lungs CTAB Abdominal: soft, non-tender, non-distended, normoactive bowel sounds, no suprapubic tenderness MSK: jerky ataxic movements Neurological: awakes to voice unable to sit up in bed without assistance but able to speak and oriented x3 Skin: warm and dry Psych: anxious affect  Filed Weights   02/27/21 0045  Weight: 68.1 kg     Intake/Output Summary (Last 24 hours) at 02/27/2021 0754 Last data filed at 02/26/2021 2130 Gross per 24 hour  Intake 240 ml  Output --  Net 240 ml   Net IO Since Admission: 240 mL [02/27/21 0754]  Pertinent Labs: CBC Latest Ref Rng & Units 02/27/2021 02/26/2021 02/25/2021  WBC 4.0 - 10.5 K/uL 6.5 6.1 6.2  Hemoglobin 12.0 - 15.0 g/dL 9.2(L)  10.3(L) 11.5(L)  Hematocrit 36.0 - 46.0 % 29.6(L) 32.0(L) 35.6(L)  Platelets 150 - 400 K/uL 160 179 188    CMP Latest Ref Rng & Units 02/27/2021 02/26/2021 02/25/2021  Glucose 70 - 99 mg/dL 462(H) 320(H) 377(H)  BUN 6 - 20 mg/dL 50(H) 31(H) 29(H)  Creatinine 0.44 - 1.00 mg/dL 2.69(H) 1.24(H) 1.00  Sodium 135 - 145 mmol/L 133(L) 134(L) 136  Potassium 3.5 - 5.1 mmol/L 4.6 4.1 4.1  Chloride 98 - 111 mmol/L 100 101 101  CO2 22 - 32 mmol/L 25 26 26   Calcium 8.9 - 10.3 mg/dL 8.8(L) 8.8(L) 9.6  Total Protein 6.5 - 8.1 g/dL - - 7.3  Total Bilirubin 0.3 - 1.2 mg/dL - - 1.1  Alkaline Phos 38 - 126 U/L - - 86  AST 15 - 41 U/L - - 15  ALT 0 - 44 U/L - - 11    Imaging: No results found.  Assessment/Plan:   Principal Problem:   Brain lesion Active Problems:   AKI (acute kidney injury) ([place])   Weight loss, non-intentional   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 ([place])   Physical deconditioning   Patient Summary: Jennifer [name] is a 50 y.o.  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 Will be able to get a better idea of what is going on (inflammatory vs malignancy vs other) once tissue sample is obtained. However, patient had an acute change the morning of 11/8 with new onset ataxia. This happened in the setting of a worsening AKI, new fever, and ongoing neck pain. Unclear whether all of these symptoms are  coming from one central cause or if there are multiple problems ongoing. Differential remains infection vs malignancy. No primary has been able to be identified thus far - Stat CT head ordered - infectious workup pending (see below) - breast and skin exam unremarkable - neurology consult, appreciate assistance - will discuss acute change in status with neurosurgery   #Fever of unknown origin Patient febrile with no clear source. UA on admission was unremarkable, patient has had extensive imaging including CT Chest AP with no acute abdominal pathology identified. Differential includes urinary source, lung, meningitis, vs oral. Infectious workup pending - UA pending - CXR ordered - CT head - blood cultures ordered  #Acute Kidney Failure Patient initially came in with a mildly elevated creatinine to 1.24 from a baseline of about 0.8-0.9. However today creatinine has increased to 2.69. Differential includes sepsis vs hypoperfusion vs obstruction. Patient's urine output has not been charted but subjectively has no difficulty or pain with voiding. It is possible with patient's hypotensive episode during night of admission has caused kidney injury though in the setting of recent fever sepsis is a concern. Will continue to work patient up for infectious source as above. - f/u UA - maintenance fluids 156mL/hr LR - avoid nephrotoxic medications  #Nausea Patient still complaining of nausea despite zofran and reglan. Suspect this is in part related to intracranial masses. No episodes of vomiting. - continue reglan and zofran   #Poorly controlled insulin-dependent type 2 diabetes #Hyperglycemia #Diabetic neuropathy and diabetic retinopathy #Concern for Gastroparesis Patient has history of poorly controlled diabetes, last hemoglobin A1c 10/16 was 12.1% with sugars in the 300s on admission. Backed off on long acting insulin yesterday and sugars spiked to the 400s, suspect this could be related to a  stress response. With sliding scale patient's sugars came down to the 80-90s. Will change sliding scale from resistant to moderate. -Holding Metformin 1000 mg twice daily 2/2 AKI - moderate sliding scale -Semglee 10 units nightly -NovoLog 3 units with meals -Sliding scale insulin resistant -Reglan 5 mg 3 times daily before meals and nightly -Tradjenta 5 mg daily -Gabapentin 300 mg 3 times daily -CBGs before meals and at bedtime   #Labile blood pressures #Suspected Autonomic Dysfunction Patient presented with blood pressures in the 200s in the ED, however several hours later as patient is sleeping blood pressures dropped as low as 80 systolic. Suspect diabetic autonomic neuropathy causing hyper and hypotension. Patient has previously been prescribed midodrine for hypotension. Her pressures have continued to fluctuate between the 26V-785Y systolic without any medications. - holding metoprolol and losartan secondary to hypotension - can consider re-addition of medications if pressures rise >180   #Atrial Fibrillation Not currently in A. fib.  Normal heart rate.  On Eliquis.  Intracranial bleeding risk secondary to brain lesions suspected  mets. -Holding Eliquis in the setting of increased bleeding risk -Continuous cardiac monitoring   #HFpEF Currently euvolemic on exam.  No SOB.  Last echo 10/16 with EF 60 to 65% and normal left ventricular function.  Grade 1 diastolic dysfunction.  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 Prescribed furosemide as needed for lower extremity swelling.  Not currently taking this medication.  No current indication for diuretics.  #Physical deconditioning Patient sleeping more with decreasing level of functioning over the past year.  - PT to eval and treat   Diet: Heart Healthy IVF: None,None VTE: SCDs Code: Full PT/OT recs: Pending, .     Prior to Admission Living Arrangement: home Anticipated Discharge Location: home Barriers to Discharge:  Medical workup of brain lesion Dispo: Anticipated discharge to Home in 2-3 days pending medical workup.     Scarlett Presto, MD Internal Medicine Resident PGY-1 Pager [phone] Please contact the on call pager after 5 pm and on weekends at [phone].

## 2021-02-27 NOTE — Progress Notes (Addendum)
  NEUROSURGERY PROGRESS NOTE   No issues overnight. Had repeat CTH this am after episode of ?ataxia. Currently seems comfortable in bed, does cont to c/o some HA.  EXAM:  BP 130/65   Pulse 69   Temp 98.3 F (36.8 C) (Oral)   Resp 14   Ht 5\' 6"  (1.676 m)   Wt 68.1 kg   LMP 08/21/2016 (Exact Date)   SpO2 94%   BMI 24.23 kg/m   Awake, alert, oriented x3 Speech fluent, in arabic CN grossly intact, blind left eye MAE apparent generalized weakness  IMAGING: CTH reviewed with stable appearance of left occipital lesions. Largely unremakable.  IMPRESSION:  50 y.o. female admitted with HA and new left occipital lesions not present on prior MRI of 7/22. Will need tissue diagnosis, ddx remains broad and includes neoplasm and inflammatory process.  PLAN: - Planning on left occipital craniotomy for resection of lesion on Thursday morning - Will order repeat MRI with stereotactic protocol for intraoperative use - Cont current mgmt per primary service   I have reviewed the plan above with the patient and her daughter at bedside as well as her son over the phone. Her daughter helps to translate. We discussed the details of surgery and the expected postop course and recovery. We also discussed the potential risks including weakness/paralysis/coma/death and risk of infection, SZ, CSF leak. All their questions were answered and pt indicated desire to proceed as above.  Consuella Lose, MD [facility]

## 2021-02-28 ENCOUNTER — Inpatient Hospital Stay (HOSPITAL_COMMUNITY): Payer: Medicaid Other

## 2021-02-28 ENCOUNTER — Other Ambulatory Visit: Payer: Self-pay | Admitting: Radiation Therapy

## 2021-02-28 LAB — CBC
HCT: 28.9 % — ABNORMAL LOW (ref 36.0–46.0)
Hemoglobin: 9.1 g/dL — ABNORMAL LOW (ref 12.0–15.0)
MCH: 27.7 pg (ref 26.0–34.0)
MCHC: 31.5 g/dL (ref 30.0–36.0)
MCV: 88.1 fL (ref 80.0–100.0)
Platelets: 133 10*3/uL — ABNORMAL LOW (ref 150–400)
RBC: 3.28 MIL/uL — ABNORMAL LOW (ref 3.87–5.11)
RDW: 12.4 % (ref 11.5–15.5)
WBC: 7.8 10*3/uL (ref 4.0–10.5)
nRBC: 0 % (ref 0.0–0.2)

## 2021-02-28 LAB — GLUCOSE, CAPILLARY
Glucose-Capillary: 307 mg/dL — ABNORMAL HIGH (ref 70–99)
Glucose-Capillary: 224 mg/dL — ABNORMAL HIGH (ref 70–99)
Glucose-Capillary: 200 mg/dL — ABNORMAL HIGH (ref 70–99)
Glucose-Capillary: 163 mg/dL — ABNORMAL HIGH (ref 70–99)
Glucose-Capillary: 175 mg/dL — ABNORMAL HIGH (ref 70–99)
Glucose-Capillary: 261 mg/dL — ABNORMAL HIGH (ref 70–99)

## 2021-02-28 LAB — COMPREHENSIVE METABOLIC PANEL
ALT: 13 U/L (ref 0–44)
AST: 17 U/L (ref 15–41)
Albumin: 2.6 g/dL — ABNORMAL LOW (ref 3.5–5.0)
Alkaline Phosphatase: 76 U/L (ref 38–126)
Anion gap: 9 (ref 5–15)
BUN: 60 mg/dL — ABNORMAL HIGH (ref 6–20)
CO2: 23 mmol/L (ref 22–32)
Calcium: 8.6 mg/dL — ABNORMAL LOW (ref 8.9–10.3)
Chloride: 100 mmol/L (ref 98–111)
Creatinine, Ser: 3.72 mg/dL — ABNORMAL HIGH (ref 0.44–1.00)
GFR, Estimated: 14 mL/min — ABNORMAL LOW (ref >60)
Glucose, Bld: 320 mg/dL — ABNORMAL HIGH (ref 70–99)
Potassium: 4.6 mmol/L (ref 3.5–5.1)
Sodium: 132 mmol/L — ABNORMAL LOW (ref 135–145)
Total Bilirubin: 0.4 mg/dL (ref 0.3–1.2)
Total Protein: 5.7 g/dL — ABNORMAL LOW (ref 6.5–8.1)

## 2021-02-28 LAB — HIV ANTIBODY (ROUTINE TESTING W REFLEX): HIV Screen 4th Generation wRfx: NONREACTIVE

## 2021-02-28 MED ORDER — LACTATED RINGERS IV BOLUS
500.0000 mL | Freq: Once | INTRAVENOUS | Status: AC
  Administered 2021-02-28: 500 mL via INTRAVENOUS

## 2021-02-28 MED ORDER — LACTATED RINGERS IV SOLN: INTRAVENOUS | Status: AC

## 2021-02-28 MED ORDER — GADOBUTROL 1 MMOL/ML IV SOLN
6.5000 mL | Freq: Once | INTRAVENOUS | Status: AC | PRN
  Administered 2021-02-28: 6.5 mL via INTRAVENOUS

## 2021-02-28 MED ORDER — INSULIN ASPART 100 UNIT/ML IJ SOLN
0.0000 [IU] | Freq: Every day | INTRAMUSCULAR | Status: DC
  Administered 2021-02-28: 3 [IU] via SUBCUTANEOUS
  Administered 2021-03-03: 2 [IU] via SUBCUTANEOUS
  Administered 2021-03-04: 3 [IU] via SUBCUTANEOUS

## 2021-02-28 MED ORDER — INSULIN ASPART 100 UNIT/ML IJ SOLN
5.0000 [IU] | Freq: Three times a day (TID) | INTRAMUSCULAR | Status: DC
  Administered 2021-02-28 – 2021-03-03 (×8): 5 [IU] via SUBCUTANEOUS

## 2021-02-28 NOTE — Progress Notes (Signed)
Orders placed for pt to have a intermittent catheter for UA. Intermittent catheter attempted by Primary RN and by floor RN. Both RNs unable to obtained urine sample due to anatomy of the pt. MD made aware during rounding. RN will continue to monitor.

## 2021-02-28 NOTE — Progress Notes (Signed)
Occupational Therapy Treatment Patient Details Name: Jennifer [name] MRN: [id number] DOB: 14-Jul-1970 Today's Date: 02/28/2021   History of present illness 50 yo female presenting to ED on 11/6 with headache, nausea, and vomiting. MRI on 11/6 showing two adjacent enhancing lesions in the L occipital lobe. PMH including A fib, blindness of L eye Due to glaucoma and cataract, CKD stage 3, Closed trimalleolar fx of R ankle (05/02/2020), Clavicle fx (02/12/08), HTN, and DM.   OT comments  Pt requiring increased assist this session due to increased ataxic movements in BUE and BLE. Pt's right side appears to be weaker this session, as when she was completing mod A +2 stand pivot transfers, her RLE gave out on her x2. Pt also having diffculty with all fine motor sasks, such as self feeding and grooming due to increased tremors and weakness. Updated D/C recommendations to CIR due to pt's increased need for assist. OT will continue to follow up and will reassess once medically stable from surgery planned for tomorrow.   Recommendations for follow up therapy are one component of a multi-disciplinary discharge planning process, led by the attending physician.  Recommendations may be updated based on patient status, additional functional criteria and insurance authorization.    Follow Up Recommendations  Acute inpatient rehab (3hours/day)    Assistance Recommended at Discharge Frequent or constant Supervision/Assistance  Equipment Recommendations  Other (comment) (TBD)    Recommendations for Other Services Rehab consult    Precautions / Restrictions Precautions Precautions: Fall Precaution Comments: ataxia Restrictions Weight Bearing Restrictions: No       Mobility Bed Mobility Overal bed mobility: Needs Assistance Bed Mobility: Supine to Sit     Supine to sit: Mod assist;HOB elevated     General bed mobility comments: assist for trunk to upright using rails, and assist needed for BLE off  bed    Transfers Overall transfer level: Needs assistance Equipment used: Rolling walker (2 wheels);None Transfers: Bed to chair/wheelchair/BSC;Sit to/from Stand Sit to Stand: Mod assist Stand pivot transfers: Mod assist;+2 physical assistance;+2 safety/equipment         General transfer comment: Mod A +2 for pivot transfer due to difficulty moving her RLE and RLE gave out on her x2 with transfer to [facility] and chair from bed     Balance Overall balance assessment: Needs assistance Sitting-balance support: Feet supported;Single extremity supported Sitting balance-Leahy Scale: Fair Sitting balance - Comments: occasional trunk wobble with ataxia, 1 LoB sitting EOB   Standing balance support: Bilateral upper extremity supported Standing balance-Leahy Scale: Poor Standing balance comment: UE support in standing                           ADL either performed or assessed with clinical judgement   ADL Overall ADL's : Needs assistance/impaired Eating/Feeding: Minimal assistance;Sitting Eating/Feeding Details (indicate cue type and reason): needs increased assist due to increased tremors Grooming: Wash/dry hands;Wash/dry face;Set up;Sitting Grooming Details (indicate cue type and reason): completed on [facility]     Lower Body Bathing: Maximal assistance;+2 for safety/equipment;Sit to/from stand Lower Body Bathing Details (indicate cue type and reason): Needing assist due to increased ataxia/tremors, +2 due to difficulty maintaining standing posture         Toilet Transfer: Moderate assistance;+2 for physical assistance;+2 for safety/equipment;Stand-pivot Toilet Transfer Details (indicate cue type and reason): PT having difficulty moving her RLE and the RLE gives out on her quickly Toileting- Clothing Manipulation and Hygiene: Maximal assistance;+2 for safety/equipment;Sit to/from stand  Toileting - Clothing Manipulation Details (indicate cue type and reason): Max A due to tremors  and weakness in RLE>LLE     Functional mobility during ADLs: Moderate assistance;+2 for physical assistance;+2 for safety/equipment;Rolling walker (2 wheels) General ADL Comments: Pt presenting with decreased balance, strength, and cognition, as well as increased ataxic movements/gait    Extremity/Trunk Assessment              Vision       Perception     Praxis      Cognition Arousal/Alertness: Awake/alert Behavior During Therapy: WFL for tasks assessed/performed Overall Cognitive Status: Difficult to assess                                            Exercises     Shoulder Instructions       General Comments Husband present intermittently during session, initially translating, then used stratus to translate remaining of session.    Pertinent Vitals/ Pain       Pain Assessment: No/denies pain  Home Living                                          Prior Functioning/Environment              Frequency  Min 2X/week        Progress Toward Goals  OT Goals(current goals can now be found in the care plan section)  Progress towards OT goals: Progressing toward goals  Acute Rehab OT Goals Patient Stated Goal: for her tremors to stop OT Goal Formulation: With patient/family Time For Goal Achievement: 03/12/21 Potential to Achieve Goals: Good ADL Goals Pt Will Perform Grooming: with supervision;standing Pt Will Perform Upper Body Dressing: with supervision;with set-up;sitting Pt Will Perform Lower Body Dressing: with set-up;with supervision;with adaptive equipment;sit to/from stand Pt Will Transfer to Toilet: with supervision;regular height toilet;ambulating Pt Will Perform Tub/Shower Transfer: Tub transfer;with min guard assist;ambulating;shower seat  Plan Frequency remains appropriate;Discharge plan needs to be updated    Co-evaluation                 AM-PAC OT "6 Clicks" Daily Activity     Outcome Measure    Help from another person eating meals?: A Little Help from another person taking care of personal grooming?: A Little Help from another person toileting, which includes using toliet, bedpan, or urinal?: A Lot Help from another person bathing (including washing, rinsing, drying)?: A Lot Help from another person to put on and taking off regular upper body clothing?: A Little Help from another person to put on and taking off regular lower body clothing?: A Lot 6 Click Score: 15    End of Session Equipment Utilized During Treatment: Gait belt;Rolling walker (2 wheels)  OT Visit Diagnosis: Unsteadiness on feet (R26.81);Repeated falls (R29.6)   Activity Tolerance Patient tolerated treatment well   Patient Left in chair;with call bell/phone within reach;with family/visitor present   Nurse Communication Mobility status        Time: 5427-0623 OT Time Calculation (min): 36 min  Charges: OT General Charges $OT Visit: 1 Visit OT Treatments $Self Care/Home Management : 8-22 mins  Quanasia Defino H., OTR/L Acute Rehabilitation  Mertice Uffelman Elane Kevante Lunt 02/28/2021, 1:17 PM

## 2021-02-28 NOTE — Progress Notes (Signed)
Physical Therapy Treatment Patient Details Name: Jennifer [name] MRN: [id number] DOB: 03-Jan-1971 Today's Date: 02/28/2021   History of Present Illness 50 yo female presenting to ED on 11/6 with headache, nausea, and vomiting. MRI on 11/6 showing two adjacent enhancing lesions in the L occipital lobe. PMH including A fib, blindness of L eye Due to glaucoma and cataract, CKD stage 3, Closed trimalleolar fx of R ankle (05/02/2020), Clavicle fx (02/12/08), HTN, and DM.    PT Comments    Patient seen in conjunction with OT to attempt in room ambulation despite ataxia, but pt felt too shaky to try and both knees buckled in standing to step to recliner at bedside.  Spouse present and concerned appropriately about symptoms.  Patient for OR tomorrow per chart.  Will re-eval post-op, she may need inpatient rehab prior to home.  PT to follow.    Recommendations for follow up therapy are one component of a multi-disciplinary discharge planning process, led by the attending physician.  Recommendations may be updated based on patient status, additional functional criteria and insurance authorization.  Follow Up Recommendations  Home health PT     Assistance Recommended at Discharge Frequent or constant Supervision/Assistance  Equipment Recommendations  None recommended by PT    Recommendations for Other Services       Precautions / Restrictions Precautions Precautions: Fall Precaution Comments: ataxia Restrictions Weight Bearing Restrictions: No     Mobility  Bed Mobility Overal bed mobility: Needs Assistance Bed Mobility: Supine to Sit     Supine to sit: Mod assist;HOB elevated     General bed mobility comments: up to [facility] with OT present    Transfers Overall transfer level: Needs assistance Equipment used: Rolling walker (2 wheels) Transfers: Sit to/from Stand Sit to Stand: Mod assist Stand pivot transfers: Mod assist;+2 physical assistance;+2 safety/equipment         General  transfer comment: Mod A +2 for pivot transfer due to difficulty moving her RLE and RLE gave out on her x2 with transfer to [facility] and chair from bed    Ambulation/Gait               General Gait Details: attempted but pt felt unable due to ataxia   Stairs             Wheelchair Mobility    Modified Rankin (Stroke Patients Only)       Balance Overall balance assessment: Needs assistance Sitting-balance support: Feet supported Sitting balance-Leahy Scale: Poor Sitting balance - Comments: frequently shaking due to ataxia   Standing balance support: Bilateral upper extremity supported;Reliant on assistive device for balance Standing balance-Leahy Scale: Poor Standing balance comment: UE support in standing                            Cognition Arousal/Alertness: Awake/alert Behavior During Therapy: WFL for tasks assessed/performed Overall Cognitive Status: Difficult to assess                                 General Comments: follows commands with interpreter assist, mild slow processing        Exercises      General Comments General comments (skin integrity, edema, etc.): patient on BSC extended time to allow her chance to void as family hoping to avoid catheter, but not able to urinate      Pertinent Vitals/Pain Pain Assessment: No/denies pain  Home Living                          Prior Function            PT Goals (current goals can now be found in the care plan section) Progress towards PT goals: Progressing toward goals    Frequency    Min 3X/week      PT Plan Current plan remains appropriate    Co-evaluation PT/OT/SLP Co-Evaluation/Treatment: Yes Reason for Co-Treatment: For patient/therapist safety;To address functional/ADL transfers          AM-PAC PT "6 Clicks" Mobility   Outcome Measure  Help needed turning from your back to your side while in a flat bed without using bedrails?: A  Little Help needed moving from lying on your back to sitting on the side of a flat bed without using bedrails?: A Lot Help needed moving to and from a bed to a chair (including a wheelchair)?: Total Help needed standing up from a chair using your arms (e.g., wheelchair or bedside chair)?: A Lot Help needed to walk in hospital room?: Total Help needed climbing 3-5 steps with a railing? : Total 6 Click Score: 10    End of Session Equipment Utilized During Treatment: Gait belt Activity Tolerance: Patient limited by fatigue Patient left: in chair;with call bell/phone within reach;with chair alarm set;with family/visitor present         Time: 3704-8889 PT Time Calculation (min) (ACUTE ONLY): 26 min  Charges:  $Therapeutic Activity: 8-22 mins                     Jennifer [name], PT Acute Rehabilitation Services [phone] Office:[phone] 02/28/2021    Jennifer [name] 02/28/2021, 4:02 PM

## 2021-02-28 NOTE — Progress Notes (Signed)
  Progress Note   Date: 02/28/2021  Patient Name: Jennifer [name]        MRN#: [id number]  Clarification of the type of atrial fibrillation:  Paroxysmal atrial fibrillation

## 2021-02-28 NOTE — Progress Notes (Signed)
HD#2 Subjective:  Overnight Events: NAEO   Patient resting comfortably in bed, says that her headache is a lot better. She is still feeling pretty weak and is unable to sit up on her own. Otherwise not having any pain in her belly. Still has an appetite and able to urinate without discomfort.  Objective:  Vital signs in last 24 hours: Vitals:   02/27/21 1607 02/27/21 2053 02/28/21 0008 02/28/21 0310  BP: (!) 128/58 (!) 133/96 (!) 129/59 (!) 101/48  Pulse: 80 90 78 77  Resp: 16 19 10 12   Temp: 98.6 F (37 C) 98.2 F (36.8 C) 99 F (37.2 C) 98.5 F (36.9 C)  TempSrc: Oral Oral Oral Oral  SpO2: 96% 95% 94% 95%  Weight:      Height:       Supplemental O2: Room Air SpO2: 95 %   Physical Exam:  Constitutional: chronically ill appearing woman resting comfortably in bed in NAD HENT:no appreciable neck swelling, pain to palpation, or skin changes, supple neck Eyes: conjunctiva non-erythematous Cardiovascular: regular rate and rhythm Pulmonary/Chest: normal work of breathing on room air, lungs CTAB Abdominal: soft, non-tender, non-distended, normoactive bowel sounds, no suprapubic tenderness MSK: jerky ataxic movements Neurological: unable to sit up in bed without assistance but able to speak and oriented x3, Dysdiadochokinesia and  dysmetria bilaterally, hypotonic reflexes diffusely, no babinski, no clonus, 3/5 muscle strength throughout Skin: warm and dry Psych: normal affect  Filed Weights   02/27/21 0045  Weight: 68.1 kg     Intake/Output Summary (Last 24 hours) at 02/28/2021 0748 Last data filed at 02/27/2021 2256 Gross per 24 hour  Intake 1030 ml  Output --  Net 1030 ml   Net IO Since Admission: 1,270 mL [02/28/21 0748]  Pertinent Labs: CBC Latest Ref Rng & Units 02/28/2021 02/27/2021 02/26/2021  WBC 4.0 - 10.5 K/uL 7.8 6.5 6.1  Hemoglobin 12.0 - 15.0 g/dL 9.1(L) 9.2(L) 10.3(L)  Hematocrit 36.0 - 46.0 % 28.9(L) 29.6(L) 32.0(L)  Platelets 150 - 400 K/uL 133(L)  160 179    CMP Latest Ref Rng & Units 02/28/2021 02/27/2021 02/26/2021  Glucose 70 - 99 mg/dL 320(H) 462(H) 320(H)  BUN 6 - 20 mg/dL 60(H) 50(H) 31(H)  Creatinine 0.44 - 1.00 mg/dL 3.72(H) 2.69(H) 1.24(H)  Sodium 135 - 145 mmol/L 132(L) 133(L) 134(L)  Potassium 3.5 - 5.1 mmol/L 4.6 4.6 4.1  Chloride 98 - 111 mmol/L 100 100 101  CO2 22 - 32 mmol/L 23 25 26   Calcium 8.9 - 10.3 mg/dL 8.6(L) 8.8(L) 8.8(L)  Total Protein 6.5 - 8.1 g/dL 5.7(L) - -  Total Bilirubin 0.3 - 1.2 mg/dL 0.4 - -  Alkaline Phos 38 - 126 U/L 76 - -  AST 15 - 41 U/L 17 - -  ALT 0 - 44 U/L 13 - -    Imaging: CT HEAD WO CONTRAST (5MM)  Result Date: 02/27/2021 CLINICAL DATA:  Headache.  Hyperglycemia. EXAM: CT HEAD WITHOUT CONTRAST TECHNIQUE: Contiguous axial images were obtained from the base of the skull through the vertex without intravenous contrast. COMPARISON:  Head CT and MRI 02/25/2021 FINDINGS: Brain: A 2 cm mixed density/hemorrhagic lesion superiorly in the left occipital lobe has not significantly changed in size although mild surrounding edema appears slightly decreased. No new intracranial hemorrhage, new region of edema, acute cortically based infarct, midline shift, or extra-axial fluid collection is identified. Hypodensities elsewhere in the cerebral white matter bilaterally are unchanged and nonspecific but compatible with mild chronic small vessel ischemic disease.  Age advanced cerebral atrophy is again noted. Vascular: No hyperdense vessel. Skull: No fracture or suspicious osseous lesion. Sinuses/Orbits: Visualized paranasal sinuses and mastoid air cells are clear. Visualized orbits are unremarkable. Other: None. IMPRESSION: 1. Unchanged size of left occipital lobe lesion with slightly decreased surrounding edema. 2. No evidence of new intracranial abnormality. Electronically Signed   By: Logan Bores M.D.   On: 02/27/2021 14:05   DG CHEST PORT 1 VIEW  Result Date: 02/27/2021 CLINICAL DATA:  Headache,  hyperglycemia, sleepy state EXAM: PORTABLE CHEST 1 VIEW COMPARISON:  Portable exam 1225 hours compared to 02/03/2021 FINDINGS: Enlargement of cardiac silhouette. Mediastinal contours and pulmonary vascularity normal. Lungs clear. No infiltrate, pleural effusion, or pneumothorax. 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 IMPRESSION: Enlargement of cardiac silhouette without acute infiltrate. Electronically Signed   By: Lavonia Dana M.D.   On: 02/27/2021 14:19    Assessment/Plan:   Principal Problem:   Brain lesion Active Problems:   AKI (acute kidney injury) ([place])   Weight loss, non-intentional   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 ([place])   Physical deconditioning   Patient Summary: Jennifer [name] is a 50 y.o. 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 Will be able to get a better idea of what is going on (inflammatory vs malignancy vs other) once tissue sample is obtained. Plan for surgery 11/10. On 11/8 patient developed new ataxia. CT head was negative for acute bleed, neurology was consulted. Metoclopramide was discontinued. Differential remains infection vs malignancy. No primary has been able to be identified thus far - f/u MRI brain - infectious workup pending (see below) - neurology consult, appreciate assistance - will discuss acute change in status with neurosurgery   #Fever  of unknown origin Patient febrile on 11/8 with no clear source. Has not had a fever since.  UA on admission was unremarkable, patient has had extensive imaging including CT Chest AP with no acute abdominal pathology identified. Headache has improved and mental status is at baseline so less concerned for meningitis. No cough or suggestion of respiratory source - UA pending - CXR unremarkable - blood cultures pending   #Acute Kidney Failure likely 2/2 contrast nephropathy Patient initially came in with a mildly elevated creatinine to 1.24 from a baseline of about 0.8-0.9. However creatinine has increased to 3.72 today. Patient has had multiple contrasted studies which have coincided with her AKI as well as an episode of hypotension the day of admission. Suspect this is due to contrast induced nephropathy but will continue to monitor and follow up infectious workup as above. - f/u UA - LR bolus of 500 followed by 18 hours of 100 ml/hr - strict I/O - avoid nephrotoxic medications   #Poorly controlled insulin-dependent type 2 diabetes #Hyperglycemia #Diabetic neuropathy and diabetic retinopathy #Concern for Gastroparesis Patient has history of poorly controlled diabetes, last hemoglobin A1c 10/16 was 12.1% with sugars in the 300s on admission. Sugars have  been elevated, suspect this could be related to a stress response.  -Holding Metformin 1000 mg twice daily 2/2 AKI - moderate sliding scale -Semglee 15 units nightly -NovoLog 5 units with meals -Reglan 5 mg 3 times daily before meals and nightly -Tradjenta 5 mg daily -Gabapentin 300 mg 3 times daily -CBGs before meals and at bedtime   #Labile blood pressures #Suspected Autonomic Dysfunction Patient presented with blood pressures in the 200s in the ED, however several hours later as patient is sleeping blood pressures dropped as low as 80 systolic. Suspect diabetic autonomic neuropathy causing hyper and hypotension. Patient has previously  been prescribed midodrine for hypotension. Her pressures have continued to fluctuate between the 33X-832N systolic without any medications. - holding metoprolol and losartan secondary to hypotension - can consider re-addition of medications if pressures rise >180   #Atrial Fibrillation Not currently in A. fib.  Normal heart rate.  On Eliquis.  Intracranial bleeding risk secondary to brain lesions suspected mets. -Holding Eliquis in the setting of increased bleeding risk - d/c cardiac monitoring   #HFpEF Currently euvolemic on exam.  No SOB.  Last echo 10/16 with EF 60 to 65% and normal left ventricular function.  Grade 1 diastolic dysfunction.  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 Prescribed furosemide as needed for lower extremity swelling.  Not currently taking this medication.  No current indication for diuretics.   #Physical deconditioning Patient sleeping more with decreasing level of functioning over the past year.  - PT recommend home health  #Nausea; resolved  Diet: Heart Healthy IVF: None,None VTE: SCDs Code: Full PT/OT recs: Pending, .   Prior to Admission Living Arrangement: home Anticipated Discharge Location: home Barriers to Discharge: Medical workup of brain lesion Dispo: Anticipated discharge to Home in 2-3 days pending medical workup  Scarlett Presto, MD Internal Medicine Resident PGY-1 Pager [phone] Please contact the on call pager after 5 pm and on weekends at [phone].

## 2021-02-28 NOTE — Progress Notes (Signed)
Inpatient Rehab Admissions Coordinator:   Per OT recommendation,  patient was screened for CIR candidacy by Clemens Catholic, MS, CCC-SLP. At this time, Pt. may be a a potential candidate for CIR. However, pt. To have surgery 11/10, [facility] team will rescreen once procedure is complete and Pt. Has been seen by therapies. Please contact me any with questions.  Clemens Catholic, [place], [place] Admissions Coordinator  [phone] ([place]) [phone] (office)

## 2021-02-28 NOTE — Progress Notes (Addendum)
Neurology Progress Note  [name] is a 50 y.o. 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 RPR was positive at 1:1, however TP-PA was negative meaning RPR was likely false positive (ID weighted in). She had positive ACE (80), neg ANCA screen, neg proteinase 3 ab, neg ANA, positive RA titer (20), neg CCP, increased total complement with mild inc C4, mildly elevated Beta 2 globulin at 0.5 (ref 0.1-0.4) otherwise normal SPEP; lyme, toxoplasmosis, quantiferron negative.  Patient on Folic acid, MTX; atropine os, PF os, cosort os per ophto at [facility]"  Exam: Current vital signs: BP (!) 103/92 (BP Location: Left Arm)   Pulse 82   Temp 99.1 F (37.3 C) (Oral)   Resp 19   Ht 5\' 6"  (1.676 m)   Wt 68.1 kg   LMP 08/21/2016 (Exact Date)   SpO2 98%   BMI 24.23 kg/m  Vital signs in last 24 hours: Temp:  [97.9  F (36.6 C)-99.1 F (37.3 C)] 99.1 F (37.3 C) (11/09 1630) Pulse Rate:  [77-82] 82 (11/09 1630) Resp:  [10-23] 19 (11/09 1630) BP: (101-129)/(48-92) 103/92 (11/09 1630) SpO2:  [94 %-99 %] 98 % (11/09 1630)   Gen: In bed, comfortable  Resp: non-labored breathing, no grossly audible wheezing Cardiac: Perfusing extremities well  Abd: soft, nt GU: Rectal tone is low.  She has some ability to squeeze but it is delayed and weak  Neuro: MS: Initially quite sleepy and hard to wake up, but once he was awoken, maintained alertness and asked very appropriate questions about her care and clinical course.  However she was disoriented to time, and occasionally had poor attention leading to some difficulty following commands.  Language examination was limited secondary to use of interpreter, but within these limitations, interpreter confirmed she had no issues with fluency or understanding, with no inappropriate word substitutions CN: Left eye is blind, right eye pupil is dilated and slightly irregular.  She does have difficulty seeing in the right visual field that is somewhat patchy.  She reports that sensation is intact throughout her face.  She has a minor right facial droop, intact hearing, and her tongue is midline Motor: She has significant asterixis.  Some intermittent adventitious movements of unclear significance.  Tone is normal but bulk is significantly reduced throughout, especially in her hands, thighs.  She is 4 to 4+ out of 5 throughout the upper extremities except for elbow extension which is 4-/5 bilaterally.  In the lower extremities she is 4 - at the hips with encouragement, and knee flexion is very difficult to gauge as she does not seem to follow this command well though  I suspect some of this may be secondary to weakness.  She may be slightly weaker in the right than the left though her effort is very inconsistent in the setting of her encephalopathy and she requires significant  coaching Sensory: She reports that the tuning fork is warm to touch throughout.  She does note that ice is cold.  She does not appear to have a sensory level on her back.  Vibration testing was attempted but it was not clear if she was accurately reporting sensation of vibration as she was very variable in her report. DTR: Absent biceps, brachioradialis, patellae, although Jendrassik maneuver was not utilized.  Toes are mute bilaterally Gait: She is able to briefly rise from the bed with significant assistance from her upper extremities.  She has a stooped posture and leans heavily on the walker for approximately 30 seconds before stating she feels weak and needs to sit back down  Pertinent Labs:  Basic Metabolic Panel: Recent Labs  Lab 02/25/21 1331 02/26/21 0306 02/27/21 0639 02/28/21 0103  NA 136 134* 133* 132*  K 4.1 4.1 4.6 4.6  CL 101 101 100 100  CO2 26 26 25 23   GLUCOSE 377* 320* 462* 320*  BUN 29* 31* 50* 60*  CREATININE 1.00 1.24* 2.69* 3.72*  CALCIUM 9.6 8.8* 8.8* 8.6*  MG  --   --  2.0  --     CBC: Recent Labs  Lab 02/25/21 1331 02/26/21 0306 02/27/21 0639 02/28/21 0103  WBC 6.2 6.1 6.5 7.8  NEUTROABS 4.6  --   --   --   HGB 11.5* 10.3* 9.2* 9.1*  HCT 35.6* 32.0* 29.6* 28.9*  MCV 85.2 86.0 88.4 88.1  PLT 188 179 160 133*   Lab Results  Component Value Date   VITAMINB12 350 02/04/2021    Coagulation Studies: No results for input(s): LABPROT, INR in the last 72 hours.   Lab Results  Component Value Date   CHOL 212 (H) 02/04/2021   HDL 54 02/04/2021   LDLCALC 142 (H) 02/04/2021   TRIG 81 02/04/2021   CHOLHDL 3.9 02/04/2021   Lab Results  Component Value Date   HGBA1C 12.1 (H) 02/03/2021    MRI brain with and without contrast personally reviewed, agree with radiology 1. Prominent diffuse FLAIR hyperintensity within the cerebral sulci may represent proteinaceous material related to meningitis or, less likely, subarachnoid hemorrhage. 2. Multiple  punctate acute infarcts in the bilateral cerebral hemispheres and right cerebellar hemisphere. 3. Unchanged appearance of 2 enhancing lesions in the left occipital lobe, may represent subacute infarct with hemorrhagic transformation versus malignancy.  ECHO: 02/04/21  1. Left ventricular ejection fraction, by estimation, is 60 to 65%. The  left ventricle has normal function. The left ventricle has no regional  wall motion abnormalities. Left ventricular diastolic parameters are  consistent with Grade I diastolic  dysfunction (impaired relaxation).   2. Right ventricular systolic function is normal. The right ventricular  size is normal. There is normal pulmonary artery systolic pressure.   3. Left atrial size was moderately dilated.   4. Right atrial size was mildly dilated.   5. The pericardial effusion is posterior to the left ventricle.   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   7. The aortic valve is tricuspid. Aortic valve regurgitation is not  visualized.   8. The inferior vena cava is dilated in size with <50% respiratory  variability, suggesting right atrial pressure of 15 mmHg.    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  Could consider sarcoidosis as an etiology.  While chest CT is reassuring against hilar lymphadenopathy, PET CT scan can be a more sensitive method of detecting non-CNS inflammatory lesions amenable to biopsy.  Unfortunately this study is not typically available on an inpatient basis.  Given negative CT chest abdomen pelvis, brain biopsy is the likely the fastest route to diagnosis.  I am concerned about her rapidly progressive weakness over the past 2 days.  Examination is notable for findings consistent with her visible lesion (right-sided  visual field deficits and facial droop), but also notable for low rectal tone, areflexia and generalized weakness however with an element of upper motor neuron pattern (extensor weakness in the arms and flexor weakness of the legs).  This is concerning for spinal cord localization.   Neurology is asked to comment on her MRI brain findings, please see plan by problem as below.   Recommendations:  # Asterixis  > This is a toxic metabolic process - Discontinue Gabapentin - Appreciate primary team's management of worsening renal function  # Encephalopathy > Likely multifactorial, in the setting of her toxic/metabolic derangements, however in the setting of CNS lesions that are cortical, she is at a significant risk of seizure activity and this should be ruled out - Routine EEG  -Ammonia, RPR  # Generalized weakness > Likely multifactorial, combination of her longstanding poorly controlled diabetes and metabolic syndrome, potential nutritional deficiencies secondary to the weight loss she has experienced, with some element of deconditioning, but given new bowel and bladder dysfunction and unknown CNS lesions, spinal cord imaging is indicated -CK, thiamine, -Hold atorvastatin -B12, MMA, B6, B3, Vitamin E, thiamine, copper, zinc, thiamine levels -MRI C- T and L-spine w/o contrast (due to renal function)   # 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  # Brain lesions # FLAIR non-suppression on MRI brain > Appreciate neurosurgery assistance with plan for biopsy to determine diagnosis, suspected the non-suppression of FLAIR is related to her CNS process but will  additionally obtain MRA head to evaluate for vascular abnormalities  - MRA head  Lesleigh Noe MD-PhD [facility] [phone]   Given need for Arabic language interpreter and complexity of patient, greater than 35 minutes were spent in care of this patient today, greater than 50% at bedside

## 2021-03-01 ENCOUNTER — Inpatient Hospital Stay (HOSPITAL_COMMUNITY): Payer: Medicaid Other

## 2021-03-01 ENCOUNTER — Inpatient Hospital Stay (HOSPITAL_COMMUNITY): Payer: Medicaid Other | Admitting: Anesthesiology

## 2021-03-01 ENCOUNTER — Encounter: Payer: Medicaid Other | Admitting: Pharmacist

## 2021-03-01 ENCOUNTER — Encounter (HOSPITAL_COMMUNITY)
Admission: EM | Disposition: A | Discharge status: Rehabilitation Facility | Payer: Self-pay | Source: Home / Self Care | Attending: Internal Medicine

## 2021-03-01 DIAGNOSIS — R4182 Altered mental status, unspecified: Secondary | ICD-10-CM

## 2021-03-01 LAB — CBC
HCT: 30.1 % — ABNORMAL LOW (ref 36.0–46.0)
Hemoglobin: 9.5 g/dL — ABNORMAL LOW (ref 12.0–15.0)
MCH: 28.1 pg (ref 26.0–34.0)
MCHC: 31.6 g/dL (ref 30.0–36.0)
MCV: 89.1 fL (ref 80.0–100.0)
Platelets: 136 10*3/uL — ABNORMAL LOW (ref 150–400)
RBC: 3.38 MIL/uL — ABNORMAL LOW (ref 3.87–5.11)
RDW: 12.3 % (ref 11.5–15.5)
WBC: 7.6 10*3/uL (ref 4.0–10.5)
nRBC: 0 % (ref 0.0–0.2)

## 2021-03-01 LAB — RENAL FUNCTION PANEL
Albumin: 2.6 g/dL — ABNORMAL LOW (ref 3.5–5.0)
Anion gap: 7 (ref 5–15)
BUN: 66 mg/dL — ABNORMAL HIGH (ref 6–20)
CO2: 25 mmol/L (ref 22–32)
Calcium: 9.6 mg/dL (ref 8.9–10.3)
Chloride: 101 mmol/L (ref 98–111)
Creatinine, Ser: 4.19 mg/dL — ABNORMAL HIGH (ref 0.44–1.00)
GFR, Estimated: 12 mL/min — ABNORMAL LOW (ref >60)
Glucose, Bld: 133 mg/dL — ABNORMAL HIGH (ref 70–99)
Phosphorus: 4.5 mg/dL (ref 2.5–4.6)
Potassium: 4.7 mmol/L (ref 3.5–5.1)
Sodium: 133 mmol/L — ABNORMAL LOW (ref 135–145)

## 2021-03-01 LAB — URINALYSIS, ROUTINE W REFLEX MICROSCOPIC
Bilirubin Urine: NEGATIVE
Glucose, UA: NEGATIVE mg/dL
Ketones, ur: NEGATIVE mg/dL
Nitrite: NEGATIVE
Protein, ur: 100 mg/dL — AB
Specific Gravity, Urine: 1.016 (ref 1.005–1.030)
WBC, UA: >50 WBC/hpf — ABNORMAL HIGH (ref 0–5)
pH: 5 (ref 5.0–8.0)

## 2021-03-01 LAB — PROTEIN AND GLUCOSE, CSF
Glucose, CSF: 81 mg/dL — ABNORMAL HIGH (ref 40–70)
Total  Protein, CSF: 50 mg/dL — ABNORMAL HIGH (ref 15–45)

## 2021-03-01 LAB — CSF CELL COUNT WITH DIFFERENTIAL
RBC Count, CSF: 0 /mm3
RBC Count, CSF: 1 /mm3 — ABNORMAL HIGH
Tube #: 1
Tube #: 4
WBC, CSF: 0 /mm3 (ref 0–5)
WBC, CSF: 1 /mm3 (ref 0–5)

## 2021-03-01 LAB — ABO/RH: ABO/RH(D): O POS

## 2021-03-01 LAB — CK: Total CK: 15 U/L — ABNORMAL LOW (ref 38–234)

## 2021-03-01 LAB — MAGNESIUM: Magnesium: 2.2 mg/dL (ref 1.7–2.4)

## 2021-03-01 LAB — VITAMIN B12: Vitamin B-12: 522 pg/mL (ref 180–914)

## 2021-03-01 LAB — GLUCOSE, CAPILLARY
Glucose-Capillary: 149 mg/dL — ABNORMAL HIGH (ref 70–99)
Glucose-Capillary: 125 mg/dL — ABNORMAL HIGH (ref 70–99)
Glucose-Capillary: 195 mg/dL — ABNORMAL HIGH (ref 70–99)
Glucose-Capillary: 127 mg/dL — ABNORMAL HIGH (ref 70–99)
Glucose-Capillary: 139 mg/dL — ABNORMAL HIGH (ref 70–99)

## 2021-03-01 LAB — TYPE AND SCREEN
ABO/RH(D): O POS
Antibody Screen: NEGATIVE

## 2021-03-01 LAB — APTT: aPTT: 29 seconds (ref 24–36)

## 2021-03-01 LAB — SEDIMENTATION RATE: Sed Rate: 69 mm/hr — ABNORMAL HIGH (ref 0–22)

## 2021-03-01 LAB — PROTIME-INR
INR: 1 (ref 0.8–1.2)
Prothrombin Time: 13.4 seconds (ref 11.4–15.2)

## 2021-03-01 LAB — SURGICAL PCR SCREEN
MRSA, PCR: NEGATIVE
Staphylococcus aureus: NEGATIVE

## 2021-03-01 LAB — C-REACTIVE PROTEIN: CRP: 11.9 mg/dL — ABNORMAL HIGH (ref <1.0)

## 2021-03-01 LAB — RPR: RPR Ser Ql: NONREACTIVE

## 2021-03-01 LAB — AMMONIA: Ammonia: 19 umol/L (ref 9–35)

## 2021-03-01 SURGERY — CRANIOTOMY TUMOR EXCISION
Anesthesia: General | Laterality: Left

## 2021-03-01 MED ORDER — PHENYLEPHRINE 40 MCG/ML (10ML) SYRINGE FOR IV PUSH (FOR BLOOD PRESSURE SUPPORT)
PREFILLED_SYRINGE | INTRAVENOUS | Status: AC
  Filled 2021-03-01: qty 10

## 2021-03-01 MED ORDER — ARTIFICIAL TEARS OPHTHALMIC OINT
TOPICAL_OINTMENT | OPHTHALMIC | Status: AC
  Filled 2021-03-01: qty 3.5

## 2021-03-01 MED ORDER — ROCURONIUM BROMIDE 10 MG/ML (PF) SYRINGE
PREFILLED_SYRINGE | INTRAVENOUS | Status: AC
  Filled 2021-03-01: qty 10

## 2021-03-01 MED ORDER — ORAL CARE MOUTH RINSE: 15.0000 mL | Freq: Once | OROMUCOSAL | Status: DC

## 2021-03-01 MED ORDER — CHLORHEXIDINE GLUCONATE 0.12 % MT SOLN
15.0000 mL | Freq: Once | OROMUCOSAL | Status: DC
  Filled 2021-03-01: qty 15

## 2021-03-01 MED ORDER — PROPOFOL 10 MG/ML IV BOLUS
INTRAVENOUS | Status: AC
  Filled 2021-03-01: qty 40

## 2021-03-01 MED ORDER — CEFAZOLIN SODIUM-DEXTROSE 2-4 GM/100ML-% IV SOLN
2.0000 g | INTRAVENOUS | Status: AC
  Filled 2021-03-01: qty 100

## 2021-03-01 MED ORDER — CHLORHEXIDINE GLUCONATE CLOTH 2 % EX PADS: 6.0000 | MEDICATED_PAD | Freq: Once | CUTANEOUS | Status: AC

## 2021-03-01 MED ORDER — LIDOCAINE 2% (20 MG/ML) 5 ML SYRINGE
INTRAMUSCULAR | Status: AC
  Filled 2021-03-01: qty 5

## 2021-03-01 MED ORDER — DEXAMETHASONE SODIUM PHOSPHATE 10 MG/ML IJ SOLN
INTRAMUSCULAR | Status: AC
  Filled 2021-03-01: qty 1

## 2021-03-01 MED ORDER — FENTANYL CITRATE (PF) 250 MCG/5ML IJ SOLN
INTRAMUSCULAR | Status: AC
  Filled 2021-03-01: qty 5

## 2021-03-01 MED ORDER — ACETAMINOPHEN 500 MG PO TABS
1000.0000 mg | ORAL_TABLET | Freq: Once | ORAL | Status: DC
  Filled 2021-03-01: qty 2

## 2021-03-01 MED ORDER — ONDANSETRON HCL 4 MG/2ML IJ SOLN
INTRAMUSCULAR | Status: AC
  Filled 2021-03-01: qty 2

## 2021-03-01 MED ORDER — SODIUM CHLORIDE 0.9 % IV SOLN: INTRAVENOUS | Status: DC

## 2021-03-01 MED ORDER — CHLORHEXIDINE GLUCONATE CLOTH 2 % EX PADS
6.0000 | MEDICATED_PAD | Freq: Once | CUTANEOUS | Status: AC
  Administered 2021-03-01: 6 via TOPICAL

## 2021-03-01 MED ORDER — CHLORHEXIDINE GLUCONATE 0.12 % MT SOLN
OROMUCOSAL | Status: AC
  Filled 2021-03-01: qty 15

## 2021-03-01 NOTE — Progress Notes (Addendum)
HD#3 Subjective:  Overnight Events: NAEO   Patient feeling much better today, says that she almost felt like she was paralyzed yesterday but she has a lot less difficulty moving around today. Still has a headache but this continues to improve. She is aware that she is going for surgery today.  Objective:  Vital signs in last 24 hours: Vitals:   02/28/21 1630 02/28/21 1945 03/01/21 0036 03/01/21 0357  BP: (!) 103/92 121/64 129/61   Pulse: 82  70   Resp: 19 18 13    Temp: 99.1 F (37.3 C) 98.3 F (36.8 C) 98.4 F (36.9 C) 98 F (36.7 C)  TempSrc: Oral Oral Oral Oral  SpO2: 98% 98% 97%   Weight:      Height:       Supplemental O2: Room Air SpO2: 97 %   Physical Exam:  Constitutional: chronically ill appearing woman resting comfortably in bed in NAD  HENT:no appreciable neck swelling, pain to palpation, or skin changes, supple neck Eyes: conjunctiva non-erythematous Cardiovascular: regular rate and rhythm, 1/6 systolic murmur, extra heart sound Pulmonary/Chest: normal work of breathing on room air, lungs CTAB Abdominal: soft, non-tender, non-distended, normoactive bowel sounds, no suprapubic tenderness, foley in place MSK: jerky ataxic movements but improved from yesterday Neurological: unable to sit up in bed without assistance but able to speak and oriented x3, Dysdiadochokinesia and dysmetria bilaterally worse on the left than the right but improved from yesterday hypotonic reflexes diffusely, no clonus, 4/5 muscle strength improved from yesterday Skin: warm and dry Psych: normal affect  Filed Weights   02/27/21 0045  Weight: 68.1 kg     Intake/Output Summary (Last 24 hours) at 03/01/2021 0611 Last data filed at 03/01/2021 0200 Gross per 24 hour  Intake 1546.67 ml  Output --  Net 1546.67 ml   Net IO Since Admission: 2,816.67 mL [03/01/21 0611]  Pertinent Labs: CBC Latest Ref Rng & Units 03/01/2021 02/28/2021 02/27/2021  WBC 4.0 - 10.5 K/uL 7.6 7.8 6.5   Hemoglobin 12.0 - 15.0 g/dL 9.5(L) 9.1(L) 9.2(L)  Hematocrit 36.0 - 46.0 % 30.1(L) 28.9(L) 29.6(L)  Platelets 150 - 400 K/uL 136(L) 133(L) 160    CMP Latest Ref Rng & Units 03/01/2021 02/28/2021 02/27/2021  Glucose 70 - 99 mg/dL 133(H) 320(H) 462(H)  BUN 6 - 20 mg/dL 66(H) 60(H) 50(H)  Creatinine 0.44 - 1.00 mg/dL 4.19(H) 3.72(H) 2.69(H)  Sodium 135 - 145 mmol/L 133(L) 132(L) 133(L)  Potassium 3.5 - 5.1 mmol/L 4.7 4.6 4.6  Chloride 98 - 111 mmol/L 101 100 100  CO2 22 - 32 mmol/L 25 23 25   Calcium 8.9 - 10.3 mg/dL 9.6 8.6(L) 8.8(L)  Total Protein 6.5 - 8.1 g/dL - 5.7(L) -  Total Bilirubin 0.3 - 1.2 mg/dL - 0.4 -  Alkaline Phos 38 - 126 U/L - 76 -  AST 15 - 41 U/L - 17 -  ALT 0 - 44 U/L - 13 -    Imaging: US RENAL  Result Date: 02/28/2021 CLINICAL DATA:  Acute kidney injury EXAM: RENAL / URINARY TRACT ULTRASOUND COMPLETE COMPARISON:  CT 02/03/2021 FINDINGS: Right Kidney: Renal measurements: 12.2 x 4.6 x 4.2 cm = volume: 123 mL. Increased echogenicity. No hydronephrosis or focal lesion. Left Kidney: Renal measurements: 11.4 x 5.9 by 4.9 cm = volume: 174 mL. Increased echogenicity. No hydronephrosis or focal lesion. Bladder: Appears normal for degree of bladder distention. Other: None. IMPRESSION: Increased echogenicity of the renal parenchyma with normal size, consistent with acute kidney injury or nephritis. No  obstruction or focal lesion. Electronically Signed   By: Nelson Chimes M.D.   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   #Two enhancing lesions in the left occipital lobe with mild edema, suspicious for metastases #proteinaceous debris Neurosurgery had been planning for a resection/biopsy today however given her poor renal function they have canceled the case. Instead neurology will perform an LP today to see if any etiology of the underlying disorder can be determined from CSF whether it is concerning for SA, meningitis, malignancy, etc. - stat PT/PTT for LP -f/u LP - neurology consult, appreciate assistance  # Multifocal strokes on MRI brain Neurology believes these are related to atrial fibrillation, off of eliquis in the setting of surgery and concern for hemorrhage into newfound brain lesions. They say that they do not suspect her symptoms are related to these new strokes given their small size. - can consider carotid ultrasound when more acute problems are stabilized. - hold eliquis  #Multiple neurologic deficits # Generalized weakness Patient has an abnormal neuro exam in the setting of acute stroke, new brain lesions, and significant metabolic derangements. Broad workup per neurology is pending. - discontinue gabapentin - f/u B12 folate B6 B3 thiamine, vitamine E, copper, zinc  - CK WNL, can likely restart statin today, will discuss with neurology - MRI  #Acute renal Failure likely 2/2 contrast nephropathy #Oliguria #Urinary retention Patient initially came in with a mildly elevated creatinine to 1.24 from a baseline of about 0.8-0.9. However creatinine has continued to increase, now 4.19. Patient has had multiple contrasted studies which have coincided with her AKI as well as an episode of hypotension the day of admission. Suspect this is due to contrast induced nephropathy but will continue to monitor and follow up infectious workup as above. Patient  has not been able to void on her own as of 11/9, on 11/10 foley was placed by urology due to difficult anatomy. - continue foley placement by urology appreciate assistance - f/u UCx - strict I/O - avoid nephrotoxic medications  #Fever of unknown origin Patient febrile on 11/8 with no clear source. Has not had a fever since.  UA on admission was unremarkable, patient has had extensive imaging including CT Chest AP with no acute abdominal pathology identified. Headache has improved and mental status is at baseline so less concerned for meningitis. No cough or suggestion of respiratory source - obtain UA as able - CXR unremarkable - blood cultures NGTD at 24 hours   #Poorly controlled insulin-dependent type 2 diabetes #Hyperglycemia #Diabetic neuropathy and diabetic retinopathy #Concern for Gastroparesis Patient has history of poorly controlled diabetes, last hemoglobin A1c 10/16 was 12.1% with sugars in the 300s on admission. Sugars have been elevated, suspect this could be related to a stress response.  -Holding Metformin 1000 mg twice daily 2/2 AKI - moderate sliding scale with meals and QHS -Semglee 15  units nightly -NovoLog 5 units with meals -Reglan 5 mg 3 times daily before meals and nightly (this is a home med, being held, no N/V noted this hospitalization) -Tradjenta 5 mg daily -CBGs before meals and at bedtime   #Labile blood pressures; improving #Suspected Autonomic Dysfunction Suspect diabetic autonomic neuropathy causing hyper and hypotension. Patient has previously been prescribed midodrine for hypotension. Pressure improved after fluid bolus and maintenance fluids on 11/9 - holding metoprolol and losartan secondary to hypotension - can consider re-addition of medications if pressures rise >180   #Paroxysmal Atrial Fibrillation Not currently in A. fib.  Normal heart rate.  On Eliquis.  Intracranial bleeding risk secondary to brain lesions suspected mets. -Holding Eliquis  in the setting of increased bleeding risk - d/c cardiac monitoring   #HFpEF Currently euvolemic on exam.  No SOB.  Last echo 10/16 with EF 60 to 65% and normal left ventricular function.  Grade 1 diastolic dysfunction.  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 Prescribed furosemide as needed for lower extremity swelling.  Not currently taking this medication.  No current indication for diuretics.   #Physical deconditioning Patient sleeping more with decreasing level of functioning over the past year.  - PT recommend home health   #Nausea; resolved   Diet: Heart Healthy IVF: None,None VTE: SCDs Code: Full PT/OT recs: Pending, .   Prior to Admission Living Arrangement: home Anticipated Discharge Location: home Barriers to Discharge: Medical workup of brain lesion Dispo: Anticipated discharge to Home in 2-3 days pending medical workup  Scarlett Presto, MD Internal Medicine Resident PGY-1 Pager [phone] Please contact the on call pager after 5 pm and on weekends at [phone].

## 2021-03-01 NOTE — Consult Note (Signed)
History of Present Illness: 50 y.o. female admitted with HA and new left occipital lesions not present on prior MRI of 7/22. Neurosurgery is planning left occipital craniotomy for resection of lesion this morning.  She has had generalized weakness and urinary retention.  She has a pure wick in place with minimal urine output. Post void residuals have been monitored and now over 700 cc.  She has a history of female circumcision and nurses have not been able to place catheter.  Urology was consulted.  She has been seen by neurology as well.  She also has had an acute bump in her creatinine from a recent low of 0.85 to 4.19 today.  She has undergone multiple screening of her upper tracts with CT renal stone study 02/03/2021 which showed no hydro, mass or stone, similar findings on CT chest abdomen and pelvis 02/25/2021 and finally renal ultrasound on 02/28/2021 showing no hydronephrosis or focal lesion.  I reviewed the images independently of each of these studies.    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  She has not seen a dentist in about one year.  Dentist is on  [address].  No fevers chills or systemic symptoms.  Diabetes has been poorly controlled for some time.  She said it has improved recently averaging around 140.  Called the dentist said they would not see patients until May 14th but the urgency o   Pap smear abnormality of cervix with LGSIL    Routine/ritual circumcision    Type II diabetes mellitus ([place])    Uveitis    Past Surgical History:  Procedure Laterality Date   CESAREAN SECTION     x 1   EYE SURGERY Bilateral    Lasik   LEFT HEART CATH AND CORONARY ANGIOGRAPHY N/A 02/05/2021   Procedure: LEFT HEART CATH AND CORONARY ANGIOGRAPHY;  Surgeon: Dixie Dials, MD;  Location: [facility];  Service: Cardiovascular;  Laterality: N/A;   MULTIPLE TOOTH EXTRACTIONS     bottom denture   ORIF ANKLE FRACTURE Right 05/05/2020   Procedure: OPEN REDUCTION INTERNAL FIXATION (ORIF) ANKLE FRACTURE;  Surgeon: Shona Needles, MD;  Location: [facility];  Service: Orthopedics;  Laterality: Right;    Home Medications:  Medications Prior to Admission  Medication Sig Dispense Refill Last Dose   amLODipine (NORVASC) 2.5 MG tablet Take 1 tablet (2.5 mg total) by mouth 2 (two) times daily. 60 tablet 0 Past Week   apixaban (ELIQUIS) 5 MG TABS tablet Take 1 tablet (5 mg total) by mouth 2 (two) times daily. 180 tablet 0 Past Week   brimonidine (  ALPHAGAN) 0.2 % ophthalmic solution Place 1 drop into the left eye every 8 (eight) hours.   Past Week   gabapentin (NEURONTIN) 300 MG capsule Take 1 capsule (300 mg total) by mouth 3 (three) times daily. 270 capsule 3 Past Week   insulin aspart (NOVOLOG) 100 UNIT/ML FlexPen Inject 3 Units into the skin 3 (three) times daily with meals. 80 mL 0 Past Week   insulin detemir (LEVEMIR) 100 UNIT/ML FlexPen Inject 15 Units into the skin at bedtime 15 mL 0 Past Week   linagliptin (TRADJENTA) 5 MG TABS tablet Take 1 tablet (5 mg total) by mouth daily. 90 tablet 0    metoCLOPramide (REGLAN) 5 MG tablet Take 1 tablet (5 mg  total) by mouth 4 (four) times daily -  before meals and at bedtime. 120 tablet 0 Past Week   metoprolol tartrate (LOPRESSOR) 25 MG tablet Take 0.5 tablets (12.5 mg total) by mouth 2 (two) times daily. 60 tablet 0 Past Week   midodrine (PROAMATINE) 5 MG tablet Take 5 mg by mouth 2 (two) times daily with a meal.   Past Week   Accu-Chek FastClix Lancets MISC Check blood sugar 4 times a day 200 each 7    ACCU-CHEK GUIDE test strip CHECK BLOOD SUGAR 4 TIMES PER DAY 150 strip 12    atorvastatin (LIPITOR) 40 MG tablet Take 1 tablet (40 mg total) by mouth daily. 90 tablet 3    Blood Glucose Monitoring Suppl (ACCU-CHEK GUIDE) w/Device KIT 1 each by Does not apply route 4 (four) times daily. 1 kit 1    calcium citrate-vitamin D (CITRACAL+D) 315-200 MG-UNIT tablet Take 2 tablets by mouth 2 (two) times daily. 360 tablet 3    diclofenac Sodium (VOLTAREN) 1 % GEL Apply 2 g topically 4 (four) times daily as needed (foot pain).      dorzolamide-timolol (COSOPT) 22.3-6.8 MG/ML ophthalmic solution Place 1 drop into the left eye 2 (two) times daily.      feeding supplement (ENSURE ENLIVE / ENSURE PLUS) LIQD Take 237 mLs by mouth 2 (two) times daily between meals. 237 mL 12    furosemide (LASIX) 20 MG tablet Please take 1 tablet daily as needed for swelling. (Patient taking differently: Take 20 mg by mouth daily as needed (swelling).) 30 tablet 0    Insulin Pen Needle (B-D UF III MINI PEN NEEDLES) 31G X 5 MM MISC Use pen needle daily for injections 100 each 2    Insulin Pen Needle (PENTIPS) 32G X 4 MM MISC Use as directed with insulin pen 100 each 3    losartan (COZAAR) 25 MG tablet Take 1 tablet (25 mg total) by mouth daily. 30 tablet 0    metFORMIN (GLUCOPHAGE-XR) 500 MG 24 hr tablet TAKE 2 TABLETS (1,000 MG TOTAL) BY MOUTH IN THE MORNING AND AT BEDTIME. 360 tablet 1    Allergies: No Known Allergies  Family History  Problem Relation Age of Onset   Diabetes Father    Hypertension Other    Social History:   reports that she has never smoked. She has never used smokeless tobacco. She reports that she does not drink alcohol and does not use drugs.  ROS: A complete review of systems was performed.  All systems are negative except for pertinent findings as noted. Review of Systems  Unable to perform ROS: Mental acuity    Physical Exam:  Vital signs in last 24 hours: Temp:  [97.9 F (36.6 C)-99.1 F (37.3 C)] 97.9 F (36.6 C) (11/10  5732) Pulse Rate:  [70-82] 78 (11/10 0806) Resp:  [13-19] 17 (11/10 0806) BP: (103-165)/(61-92) 165/73 (11/10 0806) SpO2:  [97 %-99 %] 98 % (11/10 0806) General:  Alert and oriented, No acute distress HEENT: Normocephalic, atraumatic Cardiovascular: Regular rate and rhythm Lungs: Regular rate and effort Abdomen: Soft, nontender, nondistended, no abdominal masses, midline scar suprapubic. Extremities: No edema Neurologic: Grossly intact GU: The labia have been sewn shut all the way down covering the urethral meatus but leaving an opening for the vagina.  The urethral meatus, urethra and bladder are palpably normal per vagina.  Chaperone was nurse Garen Grams and another Marine scientist.  They retracted the legs.  Procedure: The urethral meatus was prepped behind the labia and I tried a 16 French catheter but it was too flimsy and Deflecting off the meatus.  I switched to a 16 coud and with a finger in the vagina guided it into the bladder.  There was no resistance once the catheter made properly into the meatus.  The balloon was inflated and seated at the bladder neck.  She drained 800 cc of cloudy urine.  Laboratory Data:  Results for orders placed or performed during the hospital encounter of 02/25/21 (from the past 24 hour(s))  Glucose, capillary     Status: Abnormal   Collection Time: 02/28/21  8:59 AM  Result Value Ref Range   Glucose-Capillary 224 (H) 70 - 99 mg/dL  Glucose, capillary     Status: Abnormal   Collection Time: 02/28/21 12:24 PM  Result Value Ref Range    Glucose-Capillary 200 (H) 70 - 99 mg/dL  Glucose, capillary     Status: Abnormal   Collection Time: 02/28/21  3:28 PM  Result Value Ref Range   Glucose-Capillary 163 (H) 70 - 99 mg/dL  Glucose, capillary     Status: Abnormal   Collection Time: 02/28/21  5:34 PM  Result Value Ref Range   Glucose-Capillary 175 (H) 70 - 99 mg/dL  Glucose, capillary     Status: Abnormal   Collection Time: 02/28/21  9:01 PM  Result Value Ref Range   Glucose-Capillary 261 (H) 70 - 99 mg/dL  CBC     Status: Abnormal   Collection Time: 03/01/21  3:56 AM  Result Value Ref Range   WBC 7.6 4.0 - 10.5 K/uL   RBC 3.38 (L) 3.87 - 5.11 MIL/uL   Hemoglobin 9.5 (L) 12.0 - 15.0 g/dL   HCT 30.1 (L) 36.0 - 46.0 %   MCV 89.1 80.0 - 100.0 fL   MCH 28.1 26.0 - 34.0 pg   MCHC 31.6 30.0 - 36.0 g/dL   RDW 12.3 11.5 - 15.5 %   Platelets 136 (L) 150 - 400 K/uL   nRBC 0.0 0.0 - 0.2 %  Renal function panel     Status: Abnormal   Collection Time: 03/01/21  3:56 AM  Result Value Ref Range   Sodium 133 (L) 135 - 145 mmol/L   Potassium 4.7 3.5 - 5.1 mmol/L   Chloride 101 98 - 111 mmol/L   CO2 25 22 - 32 mmol/L   Glucose, Bld 133 (H) 70 - 99 mg/dL   BUN 66 (H) 6 - 20 mg/dL   Creatinine, Ser 4.19 (H) 0.44 - 1.00 mg/dL   Calcium 9.6 8.9 - 10.3 mg/dL   Phosphorus 4.5 2.5 - 4.6 mg/dL   Albumin 2.6 (L) 3.5 - 5.0 g/dL   GFR, Estimated 12 (L) >60 mL/min   Anion gap 7 5 - 15  Magnesium  Status: None   Collection Time: 03/01/21  3:56 AM  Result Value Ref Range   Magnesium 2.2 1.7 - 2.4 mg/dL  CK     Status: Abnormal   Collection Time: 03/01/21  3:56 AM  Result Value Ref Range   Total CK 15 (L) 38 - 234 U/L  Vitamin B12     Status: None   Collection Time: 03/01/21  3:56 AM  Result Value Ref Range   Vitamin B-12 522 180 - 914 pg/mL  Sedimentation rate     Status: Abnormal   Collection Time: 03/01/21  3:56 AM  Result Value Ref Range   Sed Rate 69 (H) 0 - 22 mm/hr  C-reactive protein     Status: Abnormal   Collection  Time: 03/01/21  3:56 AM  Result Value Ref Range   CRP 11.9 (H) <1.0 mg/dL  Ammonia     Status: None   Collection Time: 03/01/21  6:40 AM  Result Value Ref Range   Ammonia 19 9 - 35 umol/L  Glucose, capillary     Status: Abnormal   Collection Time: 03/01/21  8:11 AM  Result Value Ref Range   Glucose-Capillary 149 (H) 70 - 99 mg/dL   Recent Results (from the past 240 hour(s))  Resp Panel by RT-PCR (Flu A&B, Covid) Nasopharyngeal Swab     Status: None   Collection Time: 02/25/21  7:33 PM   Specimen: Nasopharyngeal Swab; Nasopharyngeal(NP) swabs in vial transport medium  Result Value Ref Range Status   SARS Coronavirus 2 by RT PCR NEGATIVE NEGATIVE Final    Comment: (NOTE) SARS-CoV-2 target nucleic acids are NOT DETECTED.  The SARS-CoV-2 RNA is generally detectable in upper respiratory specimens during the acute phase of infection. The lowest concentration of SARS-CoV-2 viral copies this assay can detect is 138 copies/mL. A negative result does not preclude SARS-Cov-2 infection and should not be used as the sole basis for treatment or other patient management decisions. A negative result may occur with  improper specimen collection/handling, submission of specimen other than nasopharyngeal swab, presence of viral mutation(s) within the areas targeted by this assay, and inadequate number of viral copies(<138 copies/mL). A negative result must be combined with clinical observations, patient history, and epidemiological information. The expected result is Negative.  Fact Sheet for Patients:  EntrepreneurPulse.com.au  Fact Sheet for Healthcare Providers:  IncredibleEmployment.be  This test is no t yet approved or cleared by the [place] FDA and  has been authorized for detection and/or diagnosis of SARS-CoV-2 by FDA under an Emergency Use Authorization (EUA). This EUA will remain  in effect (meaning this test can be used) for the duration of  the COVID-19 declaration under Section 564(b)(1) of the Act, 21 U.S.C.section 360bbb-3(b)(1), unless the authorization is terminated  or revoked sooner.       Influenza A by PCR NEGATIVE NEGATIVE Final   Influenza B by PCR NEGATIVE NEGATIVE Final    Comment: (NOTE) The Xpert Xpress SARS-CoV-2/FLU/RSV plus assay is intended as an aid in the diagnosis of influenza from Nasopharyngeal swab specimens and should not be used as a sole basis for treatment. Nasal washings and aspirates are unacceptable for Xpert Xpress SARS-CoV-2/FLU/RSV testing.  Fact Sheet for Patients: EntrepreneurPulse.com.au  Fact Sheet for Healthcare Providers: IncredibleEmployment.be  This test is not yet approved or cleared by the [place] FDA and has been authorized for detection and/or diagnosis of SARS-CoV-2 by FDA under an Emergency Use Authorization (EUA). This EUA will remain in effect (meaning this test  can be used) for the duration of the COVID-19 declaration under Section 564(b)(1) of the Act, 21 U.S.C. section 360bbb-3(b)(1), unless the authorization is terminated or revoked.  Performed at [facility], [place] [address], [place], [place] [address]   Culture, blood (routine x 2)     Status: None (Preliminary result)   Collection Time: 02/27/21 12:06 PM   Specimen: BLOOD  Result Value Ref Range Status   Specimen Description BLOOD LEFT ANTECUBITAL  Final   Special Requests   Final    BOTTLES DRAWN AEROBIC AND ANAEROBIC Blood Culture adequate volume   Culture   Final    NO GROWTH < 24 HOURS Performed at [facility], [place] [address] [place], [place] [address]    Report Status PENDING  Incomplete  Culture, blood (routine x 2)     Status: None (Preliminary result)   Collection Time: 02/27/21 12:13 PM   Specimen: BLOOD LEFT HAND  Result Value Ref Range Status   Specimen Description BLOOD LEFT HAND  Final   Special Requests   Final    BOTTLES DRAWN  AEROBIC AND ANAEROBIC Blood Culture adequate volume   Culture   Final    NO GROWTH < 24 HOURS Performed at [facility], [place] [address], [place], [place] [address]    Report Status PENDING  Incomplete   Creatinine: Recent Labs    02/25/21 1331 02/26/21 0306 02/27/21 0639 02/28/21 0103 03/01/21 0356  CREATININE 1.00 1.24* 2.69* 3.72* 4.19*    Impression/Assessment/plan:  Urinary retention-complex patient.  Retention also multifactorial given her neurologic changes and overall weakness, nonambulatory status, etc.  Recommend continuation of Foley catheter until patient is back to her baseline and ambulatory.  Once she can sit on the bedside commode or go to the bathroom Foley could be removed.  Acute kidney injury-Cr has trended up after the CT with contrast.  No urologic cause found on imaging.  Per primary team.  Consider nephrology consult.   Festus Aloe 03/01/2021, 8:16 AM

## 2021-03-01 NOTE — Procedures (Signed)
Indication: Concern for inflammatory CNS process  Risks of the procedure were dicussed with the patient including post-LP headache, bleeding, infection, weakness/numbness of legs(radiculopathy), death.  The patient/patient's proxy agreed and written consent was obtained.   The patient was prepped and draped, and using sterile technique a 20 gauge quinke spinal needle was inserted in the L3-4 space. The opening pressure was 24 mm of water with legs and hips extended. Approximately 24 cc of clear CSF were obtained and sent for analysis.   Procedure was performed with assistance of NP Kevan Ny and I was present for all critical steps of the procedure.  Lesleigh Noe MD-PhD [facility] [phone]  Available 7 AM to 7 PM, outside these hours please contact Neurologist on call listed on AMION

## 2021-03-01 NOTE — Progress Notes (Signed)
Neurology Progress Note  [name] is a 50 y.o. 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 RPR was positive at 1:1, however TP-PA was negative meaning RPR was likely false positive (ID weighted in). She had positive ACE (80), neg ANCA screen, neg proteinase 3 ab, neg ANA, positive RA titer (20), neg CCP, increased total complement with mild inc C4, mildly elevated Beta 2 globulin at 0.5 (ref 0.1-0.4) otherwise normal SPEP; lyme, toxoplasmosis, quantiferron negative.  Patient on Folic acid, MTX; atropine os, PF os, cosort os per ophto at Jennifer [name]" family confirms that she had been on methotrexate in the past and that this was stopped sometime ago   Major interval events/Subjective: - Cr worsening 1.24-->2.69--> 3.72 -> 4.19 -hyperglycemic -Na continues to trend down slowly 134->133-->132  -less lethargic today, able to communicate with use of interpreter #[id number]  Exam: Current vital signs: BP 140/66 (BP Location: Left  Arm)   Pulse 72   Temp 98.4 F (36.9 C) (Oral)   Resp 18   Ht 5\' 6"  (1.676 m)   Wt 68.1 kg   LMP 08/21/2016 (Exact Date)   SpO2 99%   BMI 24.23 kg/m  Vital signs in last 24 hours: Temp:  [97.9 F (36.6 C)-99.1 F (37.3 C)] 98.4 F (36.9 C) (11/10 1141) Pulse Rate:  [70-82] 72 (11/10 1141) Resp:  [13-19] 18 (11/10 1141) BP: (103-165)/(61-92) 140/66 (11/10 1141) SpO2:  [97 %-99 %] 99 % (11/10 1141)  Gen: In bed, comfortable  Resp: non-labored breathing, no grossly audible wheezing Cardiac: Perfusing extremities well  Abd: soft, nt GU: Rectal tone previously noted to be low.  She had some ability to squeeze but was delayed and weak  Neuro: MS: awake, alert, sitting upright in bed. Aaox2 to person and place but not year. Attention span diminished. Language examination was limited secondary to use of interpreter, but within these limitations, interpreter confirmed she had no issues with fluency or understanding, with no inappropriate word substitutions CN: Left eye is blind (baseline), right eye pupil is dilated and slightly irregular, but limited evaluation of heavily calcified left eye, cataract..  no further patchy right visual field on today's examination.  She reports that sensation is intact throughout her face.  She has a minor right facial droop, intact hearing, and her tongue is midline Motor: No further asterixis.  Tone is normal but bulk is significantly reduced throughout, especially in her hands, thighs.  5/5 upper extremities and 4/5 both lower extremities.  She may be slightly weaker in the right than the left though her effort is very inconsistent in the setting of her encephalopathy and she requires some level of coaching Sensory: from prior examination, as was not tested 11/10:" She reports that  the tuning fork is warm to touch throughout.  She does note that ice is cold.  She does not appear to have a sensory level on her back.  Vibration testing was attempted but it was  not clear if she was accurately reporting sensation of vibration as she was very variable in her report." DTR: Absent biceps, brachioradialis, patellae, although Jendrassik maneuver was not utilized.  Toes are mute bilaterally Gait: deferred on 11/10, previously "She is able to briefly rise from the bed with significant assistance from her upper extremities.  She has a stooped posture and leans heavily on the walker for approximately 30 seconds before stating she feels weak and needs to sit back down"  Pertinent Labs: Basic Metabolic Panel: Recent Labs  Lab 02/25/21 1331 02/26/21 0306 02/27/21 0639 02/28/21 0103 03/01/21 0356  NA 136 134* 133* 132* 133*  K 4.1 4.1 4.6 4.6 4.7  CL 101 101 100 100 101  CO2 26 26 25 23 25   GLUCOSE 377* 320* 462* 320* 133*  BUN 29* 31* 50* 60* 66*  CREATININE 1.00 1.24* 2.69* 3.72* 4.19*  CALCIUM 9.6 8.8* 8.8* 8.6* 9.6  MG  --   --  2.0  --  2.2  PHOS  --   --   --   --  4.5    CBC: Recent Labs  Lab 02/25/21 1331 02/26/21 0306 02/27/21 0639 02/28/21 0103 03/01/21 0356  WBC 6.2 6.1 6.5 7.8 7.6  NEUTROABS 4.6  --   --   --   --   HGB 11.5* 10.3* 9.2* 9.1* 9.5*  HCT 35.6* 32.0* 29.6* 28.9* 30.1*  MCV 85.2 86.0 88.4 88.1 89.1  PLT 188 179 160 133* 136*   Lab Results  Component Value Date   VITAMINB12 522 03/01/2021    Coagulation Studies: No results for input(s): LABPROT, INR in the last 72 hours.   Lab Results  Component Value Date   CHOL 212 (H) 02/04/2021   HDL 54 02/04/2021   LDLCALC 142 (H) 02/04/2021   TRIG 81 02/04/2021   CHOLHDL 3.9 02/04/2021   Lab Results  Component Value Date   HGBA1C 12.1 (H) 02/03/2021    11/9 MRI brain with and without contrast personally reviewed, agree with radiology 1. Prominent diffuse FLAIR hyperintensity within the cerebral sulci may represent proteinaceous material related to meningitis or, less likely, subarachnoid hemorrhage. 2. Multiple punctate acute infarcts in the bilateral cerebral  hemispheres and right cerebellar hemisphere. 3. Unchanged appearance of 2 enhancing lesions in the left occipital lobe, may represent subacute infarct with hemorrhagic transformation versus malignancy.  11/10 MRI C, T, and L-spine personally reviewed, agree with radiology: 1. Normal MRI of the cord. 2. Heterogeneity of the bilateral kidneys often seen with pyelonephritis or nephritis. 3. L4-5 left foraminal protrusion contacting the L4 nerve root.  ECHO: 02/04/21  1. Left ventricular ejection fraction, by estimation, is 60 to 65%. The  left ventricle has normal function. The left ventricle has no regional  wall motion abnormalities. Left ventricular diastolic parameters are  consistent with Grade I diastolic  dysfunction (impaired relaxation).   2. Right ventricular systolic function is normal. The right ventricular  size is normal. There is normal pulmonary artery systolic pressure.   3. Left atrial size was moderately dilated.   4. Right atrial size was mildly dilated.   5. The pericardial effusion is posterior to the left ventricle.   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   7. The aortic valve  is tricuspid. Aortic valve regurgitation is not  visualized.   8. The inferior vena cava is dilated in size with <50% respiratory  variability, suggesting right atrial pressure of 15 mmHg.   EEG:  Description: The posterior dominant rhythm consists of 8-9Hz  activity of moderate voltage (25-35 uV) seen predominantly in posterior head regions, symmetric and reactive to eye opening and eye closing. EEG showed intermittent 3 to 6 Hz theta-delta slowing. Hyperventilation and photic stimulation were not performed.    Of note, study was technically difficult due to significant electrode and movement artifact. ABNORMALITY - Intermittent slow, generalized IMPRESSION: This technically difficult study is suggestive of mild diffuse encephalopathy, nonspecific etiology.  No seizures or epileptiform discharges were seen throughout the recording.   Impression: This is a 50 year old medically complex woman with uncontrolled vascular risk factors as well as a significant autoimmune history of uveitis, possibly granulomatous in nature with negative prior TB testing and positive ACE, presents with headache and vomiting and is found to have 2 distinct left occipital lobes lesions of uncertain etiology on brain imaging.   Differentials:  Lesion is atypical for ischemia and will need to rule out inflammatory versus neoplastic lesion. Could consider sarcoidosis as an etiology.  While chest CT is reassuring against hilar lymphadenopathy, PET CT scan can be a more sensitive method of detecting non-CNS inflammatory lesions amenable to biopsy.  Unfortunately this study is not typically available on an inpatient basis.  Given negative CT chest abdomen pelvis, brain biopsy is the likely the fastest route to diagnosis, however we will first perform LP, as this is less invasive means of diagnosing malignancy such as lymphoma via cytology and flow cytometry.   There is concern for rapidly progressive weakness over the past 3 days.  Examination had been notable for findings consistent with her visible lesion (right-sided visual field deficits and facial droop), but also notable for low rectal tone, areflexia and generalized weakness however with an element of upper motor neuron pattern (extensor weakness in the arms and flexor weakness of the legs). These findings would point to spinal cord localization; however, on subsequent examination, her visual field deficits and weakness in upper extremities have resolved.    Recommendations: # Asterixis, now resolved   >This is a toxic metabolic symptom  - continue to hold Gabapentin - Appreciate primary team's management of worsening renal function -UA with leukocytes and WBCs but nitrite negative, Urine culture pending   # Encephalopathy >  Likely multifactorial, in the setting of her toxic/metabolic derangements, however in the setting of CNS lesions that are cortical, she is at a significant risk of seizure activity and this should be ruled out - Routine EEG pending  -Ammonia normal, RPR non-reactive   # Generalized weakness > Likely multifactorial, combination of her longstanding poorly controlled diabetes and metabolic syndrome, potential nutritional deficiencies secondary to the weight loss she has experienced, with some element of deconditioning, but given new bowel and bladder dysfunction and unknown CNS lesions, spinal cord imaging is indicated -thiamine, MMA,B6, B3, Vitamin E, , copper, zinc pending  -CK 15, b12 level 522, RPR nonreactive, quant interferon gold pending  -Hold atorvastatin -MRI C/T/ L-spine: no cord compression, left L4-5 foraminal stenosis causing L4 nerve root compression, personally reviewed by attending MD  # 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  # Brain lesions # FLAIR non-suppression on MRI brain > Initially was considered for biopsy due to high concern for potential malignancy in the absence of other findings.  However MRI flair non suppression suggests that CSF sampling may be useful for clarifying etiology.  Additionally surgery on hold due to her rapidly worsening kidney function. In meantime, will obtain CSF via LP. Most important studies cytology and flow cytometry, but will also obtain fungal and bacterial cultures, VDRL, JC virus PCR, cell count, protein, glucose, gram stain, acid fast smear, histoplasma antigen (will also obtain in urine), toxoplasmosis, aspergillus,  fungitell  Attending Neurologist's note:  I personally saw this patient, gathering history, performing a full neurologic examination, reviewing relevant labs, personally reviewing relevant imaging including MRI C, T and L-spine, and formulated the assessment and plan, adding the note above for completeness and clarity to accurately reflect my thoughts  Lesleigh Noe MD-PhD [facility] [phone] Available 7 AM to 7 PM, outside these hours please contact Neurologist on call listed on AMION

## 2021-03-01 NOTE — Progress Notes (Signed)
Bladder scanned patient with 729 ml is unable to void and has difficulty with in and out cath due to unusual anatomy. Zinoviev  MD notified

## 2021-03-01 NOTE — Progress Notes (Signed)
  NEUROSURGERY PROGRESS NOTE   No issues overnight.   EXAM:  BP 140/66 (BP Location: Left Arm)   Pulse 72   Temp 98.4 F (36.9 C) (Oral)   Resp 18   Ht 5\' 6"  (1.676 m)   Wt 68.1 kg   LMP 08/21/2016 (Exact Date)   SpO2 99%   BMI 24.23 kg/m   Awake, alert, oriented  Speech fluent, appropriate in arabic CN grossly intact x blind left eye Moves extremities well  IMPRESSION:  50 y.o. female admitted with HA and found to have hemorrhagic left occipital lesion, etiology unclear MRI Has demonstrated FLAIR signal change in the subarachnoid space bilaterally over the convexities, unclear if this is hemorrhage or proteinaceous fluid. MRA has also demonstrated subtle vascular dilatation raising possibility of vasculitis. Overall the differential diagnosis remains very broad, although neoplastic process seems somewhat less likely. Now found to have progressively increasing Cr, <1 on admission now >4.  PLAN: - As the small occipital lesion is not causing any significant mass effect and in light of worsening renal function, will cancel surgery for excisional biopsy today. - I have spoken with neurology and they will plan on LP with cytology. If this makes a diagnosis, we can likely forgo surgery. On the other hand, if there is still question about her CNS disease we can reschedule surgery if/when her renal function returns to baseline.  I have reviewed the situation and the above plan with the patient and her family at bedside. All their questions were answered.   Consuella Lose, MD [facility]

## 2021-03-01 NOTE — Progress Notes (Signed)
EEG complete - results pending 

## 2021-03-01 NOTE — Procedures (Signed)
Patient Name: Jennifer [name]  MRN: [id number]  Epilepsy Attending: Lora Havens  Referring Physician/Provider: Dr Lesleigh Noe Date: 03/01/2021 Duration: 23.11 mins  Patient history: 50 year old female with altered mental status and tremors /asterixis.  EEG to evaluate for seizures.  Level of alertness: Awake  AEDs during EEG study: None  Technical aspects: This EEG study was done with scalp electrodes positioned according to the 10-20 International system of electrode placement. Electrical activity was acquired at a sampling rate of 500Hz  and reviewed with a high frequency filter of 70Hz  and a low frequency filter of 1Hz . EEG data were recorded continuously and digitally stored.   Description: The posterior dominant rhythm consists of 8-9Hz  activity of moderate voltage (25-35 uV) seen predominantly in posterior head regions, symmetric and reactive to eye opening and eye closing. EEG showed intermittent 3 to 6 Hz theta-delta slowing. Hyperventilation and photic stimulation were not performed.     Of note, study was technically difficult due to significant electrode and movement artifact.  ABNORMALITY - Intermittent slow, generalized  IMPRESSION: This technically difficult study is suggestive of mild diffuse encephalopathy, nonspecific etiology. No seizures or epileptiform discharges were seen throughout the recording.  Jennifer [name] Barbra Sarks

## 2021-03-01 NOTE — Anesthesia Preprocedure Evaluation (Deleted)
Anesthesia Evaluation    Reviewed: Allergy & Precautions, Patient's Chart, lab work & pertinent test results  History of Anesthesia Complications Negative for: history of anesthetic complications  Airway        Dental  (+) Poor Dentition   Pulmonary neg pulmonary ROS,           Cardiovascular hypertension, Pt. on medications + dysrhythmias (eliquis) Atrial Fibrillation      Neuro/Psych  Headaches, PSYCHIATRIC DISORDERS Depression    GI/Hepatic negative GI ROS, Neg liver ROS,   Endo/Other  diabetes, Well Controlled, Type 2, Insulin Dependent, Oral Hypoglycemic AgentsObesity BMI 35  Renal/GU Renal InsufficiencyRenal diseaseRising creatinine  negative genitourinary   Musculoskeletal   Abdominal (+) + obese,   Peds  Hematology  (+) Blood dyscrasia, anemia , hct 31   Anesthesia Other Findings Blind L eye   Reproductive/Obstetrics negative OB ROS                            Anesthesia Physical  Anesthesia Plan  ASA: 3  Anesthesia Plan: General   Post-op Pain Management:    Induction: Intravenous  PONV Risk Score and Plan: 4 or greater and Ondansetron, Dexamethasone, Midazolam and Treatment may vary due to age or medical condition  Airway Management Planned: Oral ETT  Additional Equipment: Arterial line  Intra-op Plan:   Post-operative Plan: Possible Post-op intubation/ventilation  Informed Consent:   Plan Discussed with: Anesthesiologist  Anesthesia Plan Comments: (Discussed rising creatinine with Dr. Ralene Ok who feels it would be safe to assess kidney injury prior to surgery.  We will postpone surgery to treat acute kidney injury.)       Anesthesia Quick Evaluation

## 2021-03-02 ENCOUNTER — Other Ambulatory Visit: Payer: Self-pay | Admitting: Student

## 2021-03-02 LAB — CBC
HCT: 28.3 % — ABNORMAL LOW (ref 36.0–46.0)
Hemoglobin: 9.1 g/dL — ABNORMAL LOW (ref 12.0–15.0)
MCH: 28.1 pg (ref 26.0–34.0)
MCHC: 32.2 g/dL (ref 30.0–36.0)
MCV: 87.3 fL (ref 80.0–100.0)
Platelets: 148 10*3/uL — ABNORMAL LOW (ref 150–400)
RBC: 3.24 MIL/uL — ABNORMAL LOW (ref 3.87–5.11)
RDW: 12 % (ref 11.5–15.5)
WBC: 7.7 10*3/uL (ref 4.0–10.5)
nRBC: 0 % (ref 0.0–0.2)

## 2021-03-02 LAB — COMPREHENSIVE METABOLIC PANEL
ALT: 32 U/L (ref 0–44)
AST: 41 U/L (ref 15–41)
Albumin: 2.4 g/dL — ABNORMAL LOW (ref 3.5–5.0)
Alkaline Phosphatase: 106 U/L (ref 38–126)
Anion gap: 7 (ref 5–15)
BUN: 61 mg/dL — ABNORMAL HIGH (ref 6–20)
CO2: 26 mmol/L (ref 22–32)
Calcium: 9.5 mg/dL (ref 8.9–10.3)
Chloride: 103 mmol/L (ref 98–111)
Creatinine, Ser: 3.09 mg/dL — ABNORMAL HIGH (ref 0.44–1.00)
GFR, Estimated: 18 mL/min — ABNORMAL LOW (ref >60)
Glucose, Bld: 122 mg/dL — ABNORMAL HIGH (ref 70–99)
Potassium: 4.5 mmol/L (ref 3.5–5.1)
Sodium: 136 mmol/L (ref 135–145)
Total Bilirubin: 0.3 mg/dL (ref 0.3–1.2)
Total Protein: 5.7 g/dL — ABNORMAL LOW (ref 6.5–8.1)

## 2021-03-02 LAB — GLUCOSE, CAPILLARY
Glucose-Capillary: 135 mg/dL — ABNORMAL HIGH (ref 70–99)
Glucose-Capillary: 134 mg/dL — ABNORMAL HIGH (ref 70–99)
Glucose-Capillary: 186 mg/dL — ABNORMAL HIGH (ref 70–99)
Glucose-Capillary: 151 mg/dL — ABNORMAL HIGH (ref 70–99)
Glucose-Capillary: 103 mg/dL — ABNORMAL HIGH (ref 70–99)

## 2021-03-02 LAB — URINE CULTURE: Culture: 80000 — AB

## 2021-03-02 LAB — MAGNESIUM: Magnesium: 2.3 mg/dL (ref 1.7–2.4)

## 2021-03-02 LAB — FLOW CYTOMETRY REQUEST - FLUID (INPATIENT)

## 2021-03-02 LAB — ANGIOTENSIN CONVERTING ENZYME: Angiotensin-Converting Enzyme: 62 U/L (ref 14–82)

## 2021-03-02 LAB — SURGICAL PATHOLOGY

## 2021-03-02 MED ORDER — CHLORHEXIDINE GLUCONATE CLOTH 2 % EX PADS
6.0000 | MEDICATED_PAD | Freq: Every day | CUTANEOUS | Status: DC
  Administered 2021-03-02 – 2021-03-05 (×4): 6 via TOPICAL

## 2021-03-02 MED ORDER — APIXABAN 5 MG PO TABS
5.0000 mg | ORAL_TABLET | Freq: Two times a day (BID) | ORAL | Status: DC
  Administered 2021-03-02 – 2021-03-05 (×6): 5 mg via ORAL
  Filled 2021-03-02 (×6): qty 1

## 2021-03-02 MED ORDER — LOPERAMIDE HCL 2 MG PO CAPS
4.0000 mg | ORAL_CAPSULE | Freq: Once | ORAL | Status: AC
  Administered 2021-03-02: 4 mg via ORAL
  Filled 2021-03-02: qty 2

## 2021-03-02 NOTE — Progress Notes (Signed)
Physical Therapy Treatment Patient Details Name: Jennifer [name] MRN: [id number] DOB: [birth year] Today's Date: 03/02/2021   History of Present Illness 50 yo female presenting to ED on 11/6 with headache, nausea, and vomiting. MRI on 11/6 showing two adjacent enhancing lesions in the L occipital lobe. PMH including A fib, blindness of L eye Due to glaucoma and cataract, CKD stage 3, Closed trimalleolar fx of R ankle (05/02/2020), Clavicle fx (02/12/08), HTN, and DM.  Was scheduled for surgical biopsy/resection 11/10, but found to have ARF and supposed ataxia was asterixis.  Surgery on hold for now.    PT Comments    Patient progressing now with in room ambulation and increased sitting balance as well as getting up to EOB.  She was more impulsive and at high risk for falls unassisted.  She remains appropriate for skilled PT in the acute setting and for follow up HHPT at d/c.    Recommendations for follow up therapy are one component of a multi-disciplinary discharge planning process, led by the attending physician.  Recommendations may be updated based on patient status, additional functional criteria and insurance authorization.  Follow Up Recommendations  Home health PT     Assistance Recommended at Discharge Frequent or constant Supervision/Assistance  Equipment Recommendations  None recommended by PT    Recommendations for Other Services       Precautions / Restrictions Precautions Precautions: Fall     Mobility  Bed Mobility Overal bed mobility: Needs Assistance Bed Mobility: Supine to Sit     Supine to sit: Supervision;HOB elevated     General bed mobility comments: getting up prior to evironmental set up, cues for slowing down and waiting for safety    Transfers Overall transfer level: Needs assistance Equipment used: Rolling walker (2 wheels) Transfers: Sit to/from Stand Sit to Stand: Min guard;Supervision           General transfer comment: up impulsively to  get to [facility] prior to set up for heart monitor to reach, etc, cues to stop and wait as caught with walker near sink and unable to turn minguard to lower onto [facility],    Ambulation/Gait Ambulation/Gait assistance: Min assist;Mod assist Gait Distance (Feet): 10 Feet Assistive device: Rolling walker (2 wheels) Gait Pattern/deviations: Step-to pattern;Step-through pattern;Decreased stride length;Wide base of support;Shuffle       General Gait Details: walked around bed to recliner, assist for walker safety around obstacles in the room and for finding chair as turning the wrong way.   Stairs             Wheelchair Mobility    Modified Rankin (Stroke Patients Only)       Balance Overall balance assessment: Needs assistance Sitting-balance support: Feet supported Sitting balance-Leahy Scale: Fair Sitting balance - Comments: cleaned herself seated on BSC   Standing balance support: Bilateral upper extremity supported Standing balance-Leahy Scale: Poor Standing balance comment: UE support in standing                            Cognition Arousal/Alertness: Awake/alert Behavior During Therapy: WFL for tasks assessed/performed;Impulsive Overall Cognitive Status: Impaired/Different from baseline Area of Impairment: Safety/judgement                         Safety/Judgement: Decreased awareness of deficits;Decreased awareness of safety     General Comments: interpreter used on video, formerly [employer], but pt recieved phone call from her daughter and declined  interpreter for her daughter via speaker phone        Exercises      General Comments General comments (skin integrity, edema, etc.): Patient unsafe reaching for sink instead of walker with cues for safety.  Initially using interpreter on video, but pt had call from her daughter and then using her daughter instead.  Reports frequent mucous stools and wants to wear a brief, told her not available on the  floor but obtained mesh briefs and pad for pt.  Spouse entered end of session.      Pertinent Vitals/Pain Pain Assessment: Faces Faces Pain Scale: Hurts little more Pain Location: urinary burning, feet burning and headache Pain Descriptors / Indicators: Aching;Burning Pain Intervention(s): Monitored during session;Patient requesting pain meds-RN notified    Home Living                          Prior Function            PT Goals (current goals can now be found in the care plan section) Progress towards PT goals: Progressing toward goals    Frequency    Min 3X/week      PT Plan Current plan remains appropriate    Co-evaluation              AM-PAC PT "6 Clicks" Mobility   Outcome Measure  Help needed turning from your back to your side while in a flat bed without using bedrails?: A Little Help needed moving from lying on your back to sitting on the side of a flat bed without using bedrails?: None Help needed moving to and from a bed to a chair (including a wheelchair)?: A Little Help needed standing up from a chair using your arms (e.g., wheelchair or bedside chair)?: A Little Help needed to walk in hospital room?: A Little Help needed climbing 3-5 steps with a railing? : A Lot 6 Click Score: 18    End of Session Equipment Utilized During Treatment: Gait belt Activity Tolerance: Patient limited by fatigue Patient left: in chair;with call bell/phone within reach;with chair alarm set   PT Visit Diagnosis: Unsteadiness on feet (R26.81);Other abnormalities of gait and mobility (R26.89);Muscle weakness (generalized) (M62.81);Difficulty in walking, not elsewhere classified (R26.2)     Time: 9030-0923 PT Time Calculation (min) (ACUTE ONLY): 33 min  Charges:  $Gait Training: 8-22 mins $Therapeutic Activity: 8-22 mins                     Magda Kiel, PT Acute Rehabilitation Services Pager:[phone] Office:[phone] 03/02/2021    [name] 03/02/2021, 4:23 PM

## 2021-03-02 NOTE — Progress Notes (Addendum)
Neurology Progress Note  Brief HPI/course She initially presented on 02/25/2021 with headache and vomiting with new intracranial enhancing lesions in the left occipital lobe.  There is concern for infectious/inflammatory etiology including malignancy given her background picture of weight loss and deconditioning over several months, as well as a history of granulomatous uveitis in 2016 which was at one time treated with methotrexate (now off immunosuppression).  This is complicated by multiple medical comorbidities and poor medication adherence at home (for example her A1c has not been less than 10 and has been as high as 15 since the time of her initial diagnosis).  She was initially planned for biopsy, but her renal function was rapidly worsening during hospitalization possibly secondary to obstructive etiology as it has improved with placement of Foley on 11/10.  She also had severe weakness in the setting of her metabolic derangements and initiation of statin, for which MRI C, T and L-spine were negative and which improved with holding statin.  Repeat MRI brain also demonstrated FLAIR non suppression.  Lumbar puncture completed on 11/10 reveals bland preliminary studies  S: Patient is "sleepy" today and only arouses for short period. Unable to do ROS due to mental status. Husband at bedside acts as interpretor. He asked her if she was sleepy and she replied yes.   O: Current vital signs: BP 133/78   Pulse 68   Temp 98 F (36.7 C) (Oral)   Resp 16   Ht '5\' 6"'  (1.676 m)   Wt 68.1 kg   LMP 08/21/2016 (Exact Date)   SpO2 99%   BMI 24.23 kg/m  Vital signs in last 24 hours: Temp:  [98 F (36.7 C)-99.3 F (37.4 C)] 98 F (36.7 C) (11/11 0800) Pulse Rate:  [68-87] 68 (11/11 0800) Resp:  [16-18] 16 (11/10 2350) BP: (133-153)/(64-86) 133/78 (11/11 0800) SpO2:  [99 %-100 %] 99 % (11/11 0800)  GENERAL: Chronically ill appearing female lying in bed.  HEENT: Normocephalic and atraumatic. LUNGS:  Normal respiratory effort.  Ext: warm.  NEURO:  Mental Status: Opens her eyes to name calling, then closes. She follows commands.  Speech/Language: speech is without aphasia or dysarthria in Arabic. Unable to name or repeat.  Strength:  RUE: Grip 4/5. Tricep 5/5. Bicep 5/5.  RLE: resists examiner against gravity, but does not lift with help or coaching.  LUE: Grip 4/5. Tricep 4/5. Bicep 5/5.  LLE: Does not lift with help or coaching.  DTRs are 0 in LEs, 0-1 in UE.  Tone is decreased in LLE.   Pertinent Labs Glucose 122. ACE 62. ESR 69. RPR NR. Quantireron Gold pending. HIV NR. UA with yeast. Fungal blood culture NTD x 24 hrs.  CSF culture and gram stain-NTD 12 hours.  Acid fast smear pending. Acid fast culture pending. Fungal culture pending.    Ref. Range 03/01/2021 16:50 03/01/2021 16:50  Appearance, CSF Latest Ref Range: CLEAR  CLEAR (A) CLEAR  Glucose, CSF Latest Ref Range: 40 - 70 mg/dL 81 (H)   RBC Count, CSF Latest Ref Range: 0 /cu mm 0 1 (H)  WBC, CSF Latest Ref Range: 0 - 5 /cu mm 0 1  Other Cells, CSF Unknown RARE LYMPHS, RARE MONOCYTES RARE LYMPHOCYTES  Color, CSF Latest Ref Range: COLORLESS  COLORLESS COLORLESS  Supernatant Unknown NOT INDICATED NOT INDICATED  Total  Protein, CSF Latest Ref Range: 15 - 45 mg/dL 50 (H)   Tube # Unknown 1 4   Imaging  CT Cspine/Tspine/Lspine 03/01/21.  L L4-5 foraminal protrusion  of L4 nerve root. Otherwise normal MRI of cord.   MRA head 03/01/21 Medium size vessel irregularity with medical history suggesting premature atherosclerosis. There is 1 area of possible segmental dilatation such that an inflammatory vasculopathy is also considered.  Assessment:  This is a 50 year old medically complex woman with uncontrolled vascular risk factors as well as a significant autoimmune history of uveitis, possibly granulomatous in nature with negative prior TB testing and positive ACE, presented  with headache and vomiting and is found to  have 2 distinct left occipital lobes lesions of uncertain etiology on brain imaging. TB is pending. Given her lack of reflexes and generalized weakness, CT Cspine/Tspine/Lspine performed and is positive for L L4 nerve root compression, but her weakness is not totally consistent with dermatome, so doubt this is contributing to weakness.   Impression: Differentials include:  -Toxic metabolic encephalopathy. -Multifactorial encephalopathy.  -Multifactorial weakness secondary to longstanding poorly controlled DM, weight loss, deconditioning.  -Multifocal strokes.  -Brain lesions suspicious for metastasis with unknown primary.     Recommendations/Plan:  -Continue to hold Gabapentin and Atorvastatin, consider slow escalation of statin to avoid statin related side effects versus PSK 9 inhibitor.  -May resume Eliquis at this time -f/up nutritional labs, TB, B1, MMA, B6, Vitamin E, copper, zinc.  -f/up additional LP testing-fungal and bacterial cultures, VDRL, JC virus PCR, cell count, protein, glucose, gram stain, acid fast smear, histoplasma antigen (will also obtain in urine), toxoplasmosis, aspergillus, fungitell, by outpatient neurology if not resulted prior to discharge.   Pt seen by Clance Boll, MSN, APN-BC/Nurse Practitioner/Neuro and later by MD. Note and plan to be edited as needed by MD.  Pager: [phone]  Attending Neurologist's note:  On my examination, the patient is awake, alert, able to follow commands and converse.  Pupils equal round reactive light, no visual field deficit on confrontational testing, face is symmetric, tongue is midline. The patient continues to have some hip flexor weakness but otherwise gives me good effort.  Coordination is excellent for using her phone  Fortunately, the patient's CSF results are very reassuring against an acute pathological process.  In further discussion with neuroradiology, we discussed that the FLAIR abnormality seen could also reflect  poor clearance of gadolinium given her acute renal dysfunction.  Additionally her clinical course, specifically improvement without any specific treatment for an inflammatory/infectious process makes those processes less likely and heightens my index of suspicion that this perhaps was indeed a subacute stroke.  Additionally, at this time her RPR is no longer positive.  Her AKI is improving with relief of the obstruction (appreciate urological evaluation), and her generalized weakness has improved with discontinuation of statin.  It may be that she does not tolerate statin well and needs a PSK 9 inhibitor.  Given her improvement, I do not think that the risks of biopsy outweigh the benefits at this time.  Discussed with Dr. Kathyrn Sheriff who agrees that repeat imaging in 4 weeks and close outpatient neurology follow-up is reasonable.  If new symptoms arise sooner, imaging should be repeated sooner.  If on repeat imaging evolution is atypical for stroke patient should be referred to Dr. Kathyrn Sheriff for excisional biopsy.  Appreciate management of her renal dysfunction, gastroparesis and other comorbidities per primary team  Appropriate treatment of her severely uncontrolled vascular risk factors is likely to be the most beneficial in her long-term health.  Completing carotid ultrasound for stroke work-up is reasonable, and neurology will help follow-up this study. Given her improvement, and no other inpatient  neurological work-up needed at this time, neurology will be available on an as-needed basis going forward.  Please do reach out if new questions or concerns arise.  I personally saw this patient, gathering history, performing an examination, reviewing relevant labs, personally reviewing relevant CNS imaging detailed above, and formulated the assessment and plan, adding the note above for completeness and clarity to accurately reflect my thoughts  Lesleigh Noe MD-PhD [facility] [phone]   Available 7 AM to 7 PM, outside these hours please contact Neurologist on call listed on AMION   Greater than 35 minutes were spent in care of this patient today, greater than 50% at bedside

## 2021-03-02 NOTE — Progress Notes (Signed)
HD#4 Subjective:  Overnight Events: NAEO  Patient still feels a little bit of headache by the front of her eyes but denies any belly pain. Says that her balance is improving and she has been able to get out of bed and move around.   Objective:  Vital signs in last 24 hours: Vitals:   03/01/21 1544 03/01/21 1958 03/01/21 2350 03/02/21 0352  BP: (!) 150/86 (!) 153/65 138/64   Pulse: 76 87 76   Resp: 17 18 16    Temp: 98.7 F (37.1 C) 99.3 F (37.4 C) 99.1 F (37.3 C) 98.3 F (36.8 C)  TempSrc: Oral Oral Oral Oral  SpO2: 100% 99% 99%   Weight:      Height:       Supplemental O2: Room Air SpO2: 99 %   Physical Exam:  Constitutional: chronically ill appearing woman resting comfortably in bed in NAD  Eyes: conjunctiva non-erythematous Cardiovascular: regular rate and rhythm, 1/6 systolic murmur, extra heart sound Pulmonary/Chest: normal work of breathing on room air, lungs CTAB Abdominal: soft, non-tender, non-distended, foley in place MSK: jerky ataxic movements but improved from yesterday Neurological: alert, able to sit up in bed without assistance, Dysdiadochokinesia on the left but improved from prior, 4+/5 muscle strength in the right arm, 4-/5 muscle strength in the left arm Skin: warm and dry, no LEE Psych: normal affect   Filed Weights   02/27/21 0045  Weight: 68.1 kg     Intake/Output Summary (Last 24 hours) at 03/02/2021 0814 Last data filed at 03/02/2021 0400 Gross per 24 hour  Intake --  Output 1200 ml  Net -1200 ml   Net IO Since Admission: 1,616.67 mL [03/02/21 0814]  Pertinent Labs: CBC Latest Ref Rng & Units 03/02/2021 03/01/2021 02/28/2021  WBC 4.0 - 10.5 K/uL 7.7 7.6 7.8  Hemoglobin 12.0 - 15.0 g/dL 9.1(L) 9.5(L) 9.1(L)  Hematocrit 36.0 - 46.0 % 28.3(L) 30.1(L) 28.9(L)  Platelets 150 - 400 K/uL 148(L) 136(L) 133(L)    CMP Latest Ref Rng & Units 03/02/2021 03/01/2021 02/28/2021  Glucose 70 - 99 mg/dL 122(H) 133(H) 320(H)  BUN 6 - 20 mg/dL  61(H) 66(H) 60(H)  Creatinine 0.44 - 1.00 mg/dL 3.09(H) 4.19(H) 3.72(H)  Sodium 135 - 145 mmol/L 136 133(L) 132(L)  Potassium 3.5 - 5.1 mmol/L 4.5 4.7 4.6  Chloride 98 - 111 mmol/L 103 101 100  CO2 22 - 32 mmol/L 26 25 23   Calcium 8.9 - 10.3 mg/dL 9.5 9.6 8.6(L)  Total Protein 6.5 - 8.1 g/dL 5.7(L) - 5.7(L)  Total Bilirubin 0.3 - 1.2 mg/dL 0.3 - 0.4  Alkaline Phos 38 - 126 U/L 106 - 76  AST 15 - 41 U/L 41 - 17  ALT 0 - 44 U/L 32 - 13    Imaging: EEG adult  Result Date: 03/01/2021 Lora Havens, MD     03/01/2021  1:14 PM Patient Name: Jericho Cieslik MRN: [id number] Epilepsy Attending: Lora Havens Referring Physician/Provider: Dr Lesleigh Noe Date: 03/01/2021 Duration: 23.11 mins Patient history: 50 year old female with altered mental status and tremors /asterixis.  EEG to evaluate for seizures. Level of alertness: Awake AEDs during EEG study: None Technical aspects: This EEG study was done with scalp electrodes positioned according to the 10-20 International system of electrode placement. Electrical activity was acquired at a sampling rate of 500Hz  and reviewed with a high frequency filter of 70Hz  and a low frequency filter of 1Hz . EEG data were recorded continuously and digitally stored. Description: The posterior dominant rhythm  consists of 8-9Hz  activity of moderate voltage (25-35 uV) seen predominantly in posterior head regions, symmetric and reactive to eye opening and eye closing. EEG showed intermittent 3 to 6 Hz theta-delta slowing. Hyperventilation and photic stimulation were not performed.   Of note, study was technically difficult due to significant electrode and movement artifact. ABNORMALITY - Intermittent slow, generalized IMPRESSION: This technically difficult study is suggestive of mild diffuse encephalopathy, nonspecific etiology. No seizures or epileptiform discharges were seen throughout the recording. 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   #Two enhancing lesions in the left occipital lobe with mild edema, suspicious for metastases #proteinaceous debris Neurosurgery initially planned biopsy/resection 11/10 but 2/2 poor renal function had to defer. LP done by neurology yesterday collected largely unremarkable with cytology still pending. However low concern for meningitis/subarachnoid hemorrhage based on LP results. Inflammatory vs malignant etiology still on the differential. If renal function improves patient may still go for biopsy with neurosurgery - f/u LP cytology - possible biopsy via neurosurgery TBD - neurology consult, appreciate assistance   # Multifocal strokes on MRI brain Neurology believes these are related to atrial fibrillation, off of eliquis in the setting of surgery and concern for hemorrhage into newfound brain lesions. They say that they do not suspect her symptoms are related to these new strokes given their small size. - can consider carotid ultrasound when more acute problems are stabilized. - hold eliquis   #Multiple neurologic deficits; improving # Generalized weakness Patient has an abnormal neuro exam in the setting of acute stroke, new brain lesions, and significant metabolic derangements. Patient's ataxia significantly improved and she is able to move around much more independently. - discontinued gabapentin - f/u  B6  B3 thiamine, vitamine E, copper, zinc  - CK WNL, can likely restart statin today, will discuss with neurology - PT recommend home health  #Acute renal Failure; multifactorial #Urinary retention Patient initially came in with a mildly elevated creatinine to 1.24 from a baseline of about 0.8-0.9 but. Patient has had multiple reasons for renal injury including contrast imaging, hypotensive episodes, and urinary retention. Renal ultrasound was concerning for possible nephritis and UA had large amount of LE and WBC. Patient has not been able to void on her own as of 11/9, on 11/10 foley was placed by urology due to difficult anatomy. - continue foley placement by urology appreciate assistance - f/u UCx - strict I/O - avoid nephrotoxic medications   #Fever of unknown origin Patient febrile on 11/8 with no clear source. Has not had a fever since.  - f/u UCx - blood cultures NGTD at 48 hours   #Poorly controlled insulin-dependent type 2 diabetes #Hyperglycemia #Diabetic neuropathy and diabetic retinopathy #Concern for Gastroparesis Patient has history of poorly controlled diabetes, last hemoglobin A1c 10/16 was 12.1% with sugars in the 300s on admission. Have adjusted SSI/mealtime insulin and patient's sugars are improved -  Holding Metformin 1000 mg twice daily 2/2 AKI - moderate sliding scale with meals and QHS -Semglee 15 units nightly -NovoLog 5 units with meals -Reglan 5 mg 3 times daily before meals and nightly (this is a home med, being held, no N/V noted this hospitalization) -Tradjenta 5 mg daily -CBGs before meals and at bedtime   #Labile blood pressures; improving #Suspected Autonomic Dysfunction Suspect diabetic autonomic neuropathy causing hyper and hypotension. Patient has previously been prescribed midodrine for hypotension. Pressure improved after fluid bolus and maintenance fluids on 11/9 - holding metoprolol and losartan secondary to hypotension - can consider re-addition of  medications if pressures rise >180   #Paroxysmal Atrial Fibrillation Not currently in A. fib.  Normal heart rate.  On Eliquis.  Intracranial bleeding risk secondary to brain lesions suspected mets. -Holding Eliquis in the setting of increased bleeding risk - d/c cardiac monitoring   #HFpEF Currently euvolemic on exam.  No SOB.  Last echo 10/16 with EF 60 to 65% and normal left ventricular function.  Grade 1 diastolic dysfunction.  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 Prescribed furosemide as needed for lower extremity swelling.  Not currently taking this medication.  No current indication for diuretics.   #Nausea; resolved   Diet: Heart Healthy IVF: None,None VTE: SCDs Code: Full PT/OT recs: Pending   Prior to Admission Living Arrangement: home Anticipated Discharge Location: home Barriers to Discharge: workup of brain lesion Dispo: Anticipated discharge to Home in 2-3 days pending possible surgical procedure  Scarlett Presto, MD Internal Medicine Resident PGY-1 Pager [phone] Please contact the on call pager after 5 pm and on weekends at [phone].

## 2021-03-02 NOTE — Progress Notes (Signed)
ANTICOAGULATION CONSULT NOTE - Initial Consult  Pharmacy Consult for Apixaban Indication: atrial fibrillation  No Known Allergies  Patient Measurements: Height: 5' 6" (167.6 cm) Weight: 68.1 kg (150 lb 2.1 oz) IBW/kg (Calculated) : 59.3   Vital Signs: Temp: 98.6 F (37 C) (11/11 1538) Temp Source: Oral (11/11 1538) BP: 170/57 (11/11 1538) Pulse Rate: 83 (11/11 1538)  Labs: Recent Labs    02/28/21 0103 03/01/21 0356 03/01/21 1223 03/02/21 0329  HGB 9.1* 9.5*  --  9.1*  HCT 28.9* 30.1*  --  28.3*  PLT 133* 136*  --  148*  APTT  --   --  29  --   LABPROT  --   --  13.4  --   INR  --   --  1.0  --   CREATININE 3.72* 4.19*  --  3.09*  CKTOTAL  --  15*  --   --     Estimated Creatinine Clearance: 20.6 mL/min (A) (by C-G formula based on SCr of 3.09 mg/dL (H)).   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  She has not seen a dentist in about one year.  Dentist is on [address].  No fevers chills or systemic symptoms.  Diabetes has been poorly controlled for some time.  She said it has improved recently averaging around 140.   Called the dentist said they would not see patients until May 14th but the urgency o   Pap smear abnormality of cervix with LGSIL    Routine/ritual circumcision    Type II diabetes mellitus ([place])    Uveitis     Medications:  Medications Prior to Admission  Medication Sig Dispense Refill Last Dose   amLODipine (NORVASC) 2.5 MG tablet Take 1 tablet (2.5 mg total) by mouth 2 (two) times daily. 60 tablet 0 Past Week   apixaban (ELIQUIS) 5 MG TABS tablet Take 1 tablet (5 mg total) by mouth 2 (two) times daily. 180 tablet 0 Past Week   brimonidine (ALPHAGAN) 0.2 % ophthalmic solution Place 1 drop into the left eye every 8 (eight) hours.   Past Week   gabapentin (NEURONTIN) 300 MG capsule Take 1 capsule (300 mg total) by mouth 3 (three) times daily. 270 capsule 3 Past Week   insulin aspart (NOVOLOG) 100 UNIT/ML FlexPen Inject 3 Units into the skin 3 (three) times daily with meals. 80 mL 0 Past Week   insulin detemir (LEVEMIR) 100 UNIT/ML FlexPen Inject 15 Units into the skin at bedtime 15 mL 0 Past Week   linagliptin (TRADJENTA) 5 MG TABS tablet Take 1 tablet (5 mg total)  by mouth daily. 90 tablet 0    metoCLOPramide (REGLAN) 5 MG tablet Take 1 tablet (5 mg total) by mouth 4 (four) times daily -  before meals and at bedtime. 120 tablet 0 Past Week   metoprolol tartrate (LOPRESSOR) 25 MG tablet Take 0.5 tablets (12.5 mg total) by mouth 2 (two) times daily. 60 tablet 0 Past Week   midodrine (PROAMATINE) 5 MG tablet Take 5 mg by mouth 2 (two) times daily with a meal.   Past Week   Accu-Chek FastClix Lancets MISC Check blood sugar 4 times a day 200 each 7    ACCU-CHEK GUIDE test strip CHECK BLOOD SUGAR 4 TIMES PER DAY 150 strip 12    atorvastatin (LIPITOR) 40 MG tablet Take 1 tablet (40 mg total) by mouth daily. 90 tablet 3    Blood Glucose Monitoring Suppl (ACCU-CHEK GUIDE) w/Device KIT 1 each by Does not apply route 4 (four) times daily. 1 kit 1    calcium citrate-vitamin D (CITRACAL+D) 315-200  MG-UNIT tablet Take 2 tablets by mouth 2 (two) times daily. 360 tablet 3    diclofenac Sodium (VOLTAREN) 1 % GEL Apply 2 g topically 4 (four) times daily as needed (foot pain).      dorzolamide-timolol (COSOPT) 22.3-6.8 MG/ML ophthalmic solution Place 1 drop into the left eye 2 (two) times daily.      feeding supplement (ENSURE ENLIVE / ENSURE PLUS) LIQD Take 237 mLs by mouth 2 (two) times daily between meals. 237 mL 12    furosemide (LASIX) 20 MG tablet Please take 1 tablet daily as needed for swelling. (Patient taking differently: Take 20 mg by mouth daily as needed (swelling).) 30 tablet 0    Insulin Pen Needle (B-D UF III MINI PEN NEEDLES) 31G X 5 MM MISC Use pen needle daily for injections 100 each 2    Insulin Pen Needle (PENTIPS) 32G X 4 MM MISC Use as directed with insulin pen 100 each 3    losartan (COZAAR) 25 MG tablet Take 1 tablet (25 mg total) by mouth daily. 30 tablet 0    metFORMIN (GLUCOPHAGE-XR) 500 MG 24 hr tablet TAKE 2 TABLETS (1,000 MG TOTAL) BY MOUTH IN THE MORNING AND AT BEDTIME. 360 tablet 1     Assessment: 50 yo female with lwft occipital lobe lesions on apixaban PTA for afib. Per Neuro, now OK to restart apixaban 82m BID as PTA  Goal of Therapy:   Monitor platelets by anticoagulation protocol: Yes   Plan:  Apixaban 515mPO BID Monitor for bleeding  Baylyn Sickles A. PiLevada DyPharmD, BCPS, FNKF Clinical Pharmacist [place] Please utilize Amion for appropriate phone number to reach the unit pharmacist ([place] 03/02/2021,6:42 PM

## 2021-03-03 ENCOUNTER — Inpatient Hospital Stay (HOSPITAL_COMMUNITY): Payer: Medicaid Other

## 2021-03-03 DIAGNOSIS — G939 Disorder of brain, unspecified: Principal | ICD-10-CM

## 2021-03-03 LAB — RENAL FUNCTION PANEL
Albumin: 2.4 g/dL — ABNORMAL LOW (ref 3.5–5.0)
Anion gap: 7 (ref 5–15)
BUN: 37 mg/dL — ABNORMAL HIGH (ref 6–20)
CO2: 26 mmol/L (ref 22–32)
Calcium: 9.4 mg/dL (ref 8.9–10.3)
Chloride: 104 mmol/L (ref 98–111)
Creatinine, Ser: 1.49 mg/dL — ABNORMAL HIGH (ref 0.44–1.00)
GFR, Estimated: 43 mL/min — ABNORMAL LOW (ref >60)
Glucose, Bld: 119 mg/dL — ABNORMAL HIGH (ref 70–99)
Phosphorus: 4.1 mg/dL (ref 2.5–4.6)
Potassium: 3.7 mmol/L (ref 3.5–5.1)
Sodium: 137 mmol/L (ref 135–145)

## 2021-03-03 LAB — GLUCOSE, CAPILLARY
Glucose-Capillary: 129 mg/dL — ABNORMAL HIGH (ref 70–99)
Glucose-Capillary: 121 mg/dL — ABNORMAL HIGH (ref 70–99)
Glucose-Capillary: 179 mg/dL — ABNORMAL HIGH (ref 70–99)
Glucose-Capillary: 104 mg/dL — ABNORMAL HIGH (ref 70–99)
Glucose-Capillary: 230 mg/dL — ABNORMAL HIGH (ref 70–99)

## 2021-03-03 LAB — COPPER, SERUM: Copper: 136 ug/dL (ref 80–158)

## 2021-03-03 LAB — ACID FAST SMEAR (AFB, MYCOBACTERIA): Acid Fast Smear: NEGATIVE

## 2021-03-03 LAB — CBC
HCT: 27.8 % — ABNORMAL LOW (ref 36.0–46.0)
Hemoglobin: 8.9 g/dL — ABNORMAL LOW (ref 12.0–15.0)
MCH: 27.6 pg (ref 26.0–34.0)
MCHC: 32 g/dL (ref 30.0–36.0)
MCV: 86.3 fL (ref 80.0–100.0)
Platelets: 184 10*3/uL (ref 150–400)
RBC: 3.22 MIL/uL — ABNORMAL LOW (ref 3.87–5.11)
RDW: 11.9 % (ref 11.5–15.5)
WBC: 6.7 10*3/uL (ref 4.0–10.5)
nRBC: 0 % (ref 0.0–0.2)

## 2021-03-03 LAB — ZINC: Zinc: 55 ug/dL (ref 44–115)

## 2021-03-03 LAB — FUNGITELL, SERUM: Fungitell Result: 37 pg/mL (ref <80)

## 2021-03-03 MED ORDER — FLUCONAZOLE IN SODIUM CHLORIDE 200-0.9 MG/100ML-% IV SOLN
200.0000 mg | INTRAVENOUS | Status: DC
  Administered 2021-03-03: 200 mg via INTRAVENOUS
  Filled 2021-03-03 (×2): qty 100

## 2021-03-03 NOTE — Progress Notes (Signed)
HD#5 Subjective:  Overnight Events: NAEO   Patient feeling better this morning. Says that she is able to get up out of bed but that she does need help from her family or the nurses when she moves around. She says that the foley catheter is uncomfortable.  Objective:  Vital signs in last 24 hours: Vitals:   03/02/21 2035 03/02/21 2348 03/03/21 0414 03/03/21 0747  BP: (!) 178/73 (!) 152/70 140/63 129/62  Pulse: 80 79 75 73  Resp: 15 18 15 18   Temp: 98.4 F (36.9 C) 98.1 F (36.7 C) 98.7 F (37.1 C) 98.3 F (36.8 C)  TempSrc: Oral Oral Oral Oral  SpO2: 100% 100% 100% 98%  Weight:      Height:       Supplemental O2: Room Air SpO2: 98 %   Physical Exam:  Constitutional: chronically ill appearing woman resting comfortably in bed in NAD  Eyes: conjunctiva non-erythematous Cardiovascular: regular rate and rhythm, 1/6 systolic murmur, extra heart sound Pulmonary/Chest: normal work of breathing on room air, lungs CTAB Abdominal: soft, non-tender, non-distended, foley in place MSK: Ataxia almost completely resolved Neurological: alert, able to sit up in bed without assistance, Left Dysdiadochokinesia improving but improved from prior, no dysmetria  Skin: warm and dry, no LEE Psych: normal affect     Filed Weights   02/27/21 0045  Weight: 68.1 kg     Intake/Output Summary (Last 24 hours) at 03/03/2021 1224 Last data filed at 03/03/2021 0600 Gross per 24 hour  Intake 117 ml  Output 3100 ml  Net -2983 ml   Net IO Since Admission: -2,166.33 mL [03/03/21 1224]  Pertinent Labs: CBC Latest Ref Rng & Units 03/03/2021 03/02/2021 03/01/2021  WBC 4.0 - 10.5 K/uL 6.7 7.7 7.6  Hemoglobin 12.0 - 15.0 g/dL 8.9(L) 9.1(L) 9.5(L)  Hematocrit 36.0 - 46.0 % 27.8(L) 28.3(L) 30.1(L)  Platelets 150 - 400 K/uL 184 148(L) 136(L)    CMP Latest Ref Rng & Units 03/03/2021 03/02/2021 03/01/2021  Glucose 70 - 99 mg/dL 119(H) 122(H) 133(H)  BUN 6 - 20 mg/dL 37(H) 61(H) 66(H)  Creatinine  0.44 - 1.00 mg/dL 1.49(H) 3.09(H) 4.19(H)  Sodium 135 - 145 mmol/L 137 136 133(L)  Potassium 3.5 - 5.1 mmol/L 3.7 4.5 4.7  Chloride 98 - 111 mmol/L 104 103 101  CO2 22 - 32 mmol/L 26 26 25   Calcium 8.9 - 10.3 mg/dL 9.4 9.5 9.6  Total Protein 6.5 - 8.1 g/dL - 5.7(L) -  Total Bilirubin 0.3 - 1.2 mg/dL - 0.3 -  Alkaline Phos 38 - 126 U/L - 106 -  AST 15 - 41 U/L - 41 -  ALT 0 - 44 U/L - 32 -    Imaging: VAS US CAROTID  Result Date: 03/03/2021 Carotid Arterial Duplex Study Patient Name:  RAYNETTA OSTERLOH  Date of Exam:   03/03/2021 Medical Rec #: [id number]       Accession #:    [id number] Date of Birth: [birth year]        Patient Gender: F Patient Age:   50 years Exam Location:  [facility] Procedure:      VAS US CAROTID Referring Phys: Dorian Pod --------------------------------------------------------------------------------  Indications:       CVA. Risk Factors:      Hypertension, hyperlipidemia, Diabetes. Comparison Study:  No prior study Performing Technologist: Maudry Mayhew MHA, RDMS, RVT, RDCS  Examination Guidelines: A complete evaluation includes B-mode imaging, spectral Doppler, color Doppler, and power Doppler as needed of all accessible portions  of each vessel. Bilateral testing is considered an integral part of a complete examination. Limited examinations for reoccurring indications may be performed as noted.  Right Carotid Findings: +----------+--------+--------+--------+------------------+--------+           PSV cm/sEDV cm/sStenosisPlaque DescriptionComments +----------+--------+--------+--------+------------------+--------+ CCA Prox  76      13                                         +----------+--------+--------+--------+------------------+--------+ CCA Distal92      21                                         +----------+--------+--------+--------+------------------+--------+ ICA Prox  70      16                                          +----------+--------+--------+--------+------------------+--------+ ICA Distal92      32                                         +----------+--------+--------+--------+------------------+--------+ ECA       72                                                 +----------+--------+--------+--------+------------------+--------+ +----------+--------+-------+----------------+-------------------+           PSV cm/sEDV cmsDescribe        Arm Pressure (mmHG) +----------+--------+-------+----------------+-------------------+ [id number]            Multiphasic, WNL                    +----------+--------+-------+----------------+-------------------+ +---------+--------+--+--------+--+---------+ VertebralPSV cm/s75EDV cm/s22Antegrade +---------+--------+--+--------+--+---------+  Left Carotid Findings: +----------+--------+--------+--------+-----------------------+--------+           PSV cm/sEDV cm/sStenosisPlaque Description     Comments +----------+--------+--------+--------+-----------------------+--------+ CCA Prox  84      14                                              +----------+--------+--------+--------+-----------------------+--------+ CCA Distal90      22              smooth and heterogenous         +----------+--------+--------+--------+-----------------------+--------+ ICA Prox  84      22                                              +----------+--------+--------+--------+-----------------------+--------+ ICA Distal66      21                                              +----------+--------+--------+--------+-----------------------+--------+ ECA       101  11                                              +----------+--------+--------+--------+-----------------------+--------+ +----------+--------+--------+----------------+-------------------+           PSV cm/sEDV cm/sDescribe        Arm Pressure (mmHG)  +----------+--------+--------+----------------+-------------------+ [id number]             Multiphasic, WNL                    +----------+--------+--------+----------------+-------------------+ +---------+--------+--+--------+--+---------+ VertebralPSV cm/s34EDV cm/s11Antegrade +---------+--------+--+--------+--+---------+   Summary: Right Carotid: Velocities in the right ICA are consistent with a 1-39% stenosis. Left Carotid: Velocities in the left ICA are consistent with a 1-39% stenosis. Vertebrals:  Bilateral vertebral arteries demonstrate antegrade flow. Subclavians: Normal flow hemodynamics were seen in bilateral subclavian              arteries. *See table(s) above for measurements and observations.     Preliminary     Assessment/Plan:   Principal Problem:   Brain lesion Active Problems:   AKI (acute kidney injury) ([place])   Weight loss, non-intentional   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 ([place])   Physical deconditioning   Patient Summary: Jennifer [name] is a 50 y.o. 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   #Two enhancing lesions in the left occipital lobe with mild edema # Multifocal strokes on MRI brain Neurosurgery initially planned biopsy/resection 11/10 but 2/2 poor renal function had to defer. LP done by neurology yesterday collected largely unremarkable with cytology still pending. However low concern for meningitis/subarachnoid hemorrhage based on LP results with cytology still pending. Patient has has significant improvement despite conservative management. Neurology feels that this whole presentation may actually be due to subacute  CVAs rather than malignancy or inflammatory process. Will plan on repeat imaging as an outpatient and reevaluating at that time.  - f/u LP cytology - plan to repeat imaging in 4 weeks - neurology consult, appreciate assistance - continue to manage stroke risk factors   #Acute renal Failure; multifactorial #Urinary retention Patient initially came in with a mildly elevated creatinine to 1.24 from a baseline of about 0.8-0.9 but. Patient has had multiple reasons for renal injury including contrast imaging, hypotensive episodes, and urinary retention.  Patient was  unable to void on her own as of 11/9 and on 11/10 foley was placed by urology due to difficult anatomy. Will go home with foley. Spoke with urology today who will plan to see her in 1 week after discharge for voiding trial. - continue foley - avoid nephrotoxic medications - daily BMP   #UTI Urine culture growing 80,000. 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 - start diflucan 200 daily (end 11/25)  #Poorly controlled insulin-dependent type 2 diabetes #Hyperglycemia #Diabetic neuropathy and diabetic retinopathy #Concern for Gastroparesis Patient has history of poorly controlled diabetes, last hemoglobin A1c 10/16 was 12.1% with sugars in the 300s on admission. Have adjusted SSI/mealtime insulin and patient's sugars are improved -  Holding Metformin 1000 mg twice daily 2/2 AKI - moderate sliding scale with meals and QHS -Semglee 15 units nightly -NovoLog 5 units with meals -Reglan 5 mg 3 times daily before meals and nightly (this is a home med, being held, no N/V noted this hospitalization) -Tradjenta 5 mg daily -CBGs before meals and at bedtime   #Labile blood pressures; improving #Suspected Autonomic Dysfunction Suspect diabetic autonomic neuropathy causing hyper and hypotension. Patient has previously been prescribed midodrine for hypotension.  Pressure improved after fluid bolus and maintenance fluids on 11/9 - holding metoprolol and losartan secondary to hypotension - can consider re-addition of medications if pressures rise >180   #Paroxysmal Atrial Fibrillation Not currently in A. fib.  Normal heart rate.  On Eliquis.  Intracranial bleeding risk secondary to brain lesions suspected mets. -Holding Eliquis in the setting of increased bleeding risk - d/c cardiac monitoring   #HFpEF Currently euvolemic on exam.  No SOB.  Last echo 10/16 with EF 60 to 65% and normal left ventricular function.  Grade 1 diastolic dysfunction.  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 Prescribed furosemide as needed for lower extremity swelling.  Not currently taking this medication.  No current indication for diuretics.   #Nausea; resolved  Diet: Heart Healthy IVF: None,None VTE: Eliquis Code: Full PT/OT recs: Pending  Scarlett Presto, MD Internal Medicine Resident PGY-1 Pager [phone] Please contact the on call pager after 5 pm and on weekends at [phone].

## 2021-03-03 NOTE — Discharge Summary (Signed)
Name: Jeffrie Stander MRN: [id number] DOB: Jul 17, 1970 50 y.o. PCP: Jose Persia, MD  Date of Admission: 02/25/2021  1:17 PM Date of Discharge: 03/05/21 Attending Physician: Axel Filler, *  Discharge Diagnosis: 1. Multifocal CVAs; enhancing occipital lobe lesion 2.Acute Renal Failure; resolved 3. Urinary Retention 4. UTI 5. T2DM c/b neuropathy and retinopathy 6. Autonomic Dysfunction 7. Paroxysmal Atrial Fibrillation 8. HFpEF 9. Emesis; resolved  Discharge Medications: Allergies as of 03/05/2021   No Known Allergies      Medication List     STOP taking these medications    atorvastatin 40 MG tablet Commonly known as: LIPITOR   furosemide 20 MG tablet Commonly known as: Lasix   losartan 25 MG tablet Commonly known as: COZAAR   metoCLOPramide 5 MG tablet Commonly known as: Reglan   metoprolol tartrate 25 MG tablet Commonly known as: LOPRESSOR   midodrine 5 MG tablet Commonly known as: PROAMATINE       TAKE these medications    Accu-Chek FastClix Lancets Misc Check blood sugar 4 times a day   Accu-Chek Guide test strip Generic drug: glucose blood CHECK BLOOD SUGAR 4 TIMES PER DAY   Accu-Chek Guide w/Device Kit 1 each by Does not apply route 4 (four) times daily.   amLODipine 2.5 MG tablet Commonly known as: NORVASC Take 1 tablet (2.5 mg total) by mouth 2 (two) times daily.   apixaban 5 MG Tabs tablet Commonly known as: ELIQUIS Take 1 tablet (5 mg total) by mouth 2 (two) times daily.   B-D UF III MINI PEN NEEDLES 31G X 5 MM Misc Generic drug: Insulin Pen Needle Use pen needle daily for injections   Pentips 32G X 4 MM Misc Generic drug: Insulin Pen Needle Use as directed with insulin pen   brimonidine 0.2 % ophthalmic solution Commonly known as: ALPHAGAN Place 1 drop into the left eye every 8 (eight) hours.   calcium citrate-vitamin D 315-200 MG-UNIT tablet Commonly known as: CITRACAL+D Take 2 tablets by mouth 2 (two) times  daily.   diclofenac Sodium 1 % Gel Commonly known as: VOLTAREN Apply 2 g topically 4 (four) times daily as needed (foot pain).   dorzolamide-timolol 22.3-6.8 MG/ML ophthalmic solution Commonly known as: COSOPT Place 1 drop into the left eye 2 (two) times daily.   feeding supplement Liqd Take 237 mLs by mouth 2 (two) times daily between meals.   fluconazole 200 MG tablet Commonly known as: DIFLUCAN Take 1 tablet (200 mg total) by mouth daily for 12 days. Start taking on: March 06, 2021   gabapentin 300 MG capsule Commonly known as: Neurontin Take 1 capsule (300 mg total) by mouth at bedtime. What changed: when to take this   insulin aspart 100 UNIT/ML FlexPen Commonly known as: NOVOLOG Inject 3 Units into the skin 3 (three) times daily with meals.   insulin detemir 100 UNIT/ML FlexPen Commonly known as: LEVEMIR Inject 15 Units into the skin at bedtime   linagliptin 5 MG Tabs tablet Commonly known as: TRADJENTA Take 1 tablet (5 mg total) by mouth daily.   metFORMIN 500 MG 24 hr tablet Commonly known as: GLUCOPHAGE-XR TAKE 2 TABLETS (1,000 MG TOTAL) BY MOUTH IN THE MORNING AND AT BEDTIME.               Durable Medical Equipment  (From admission, onward)           Start     Ordered   03/05/21 1415  For home use only DME Tub bench  Once        03/05/21 1414            Disposition and follow-up:   Ms.Quantasia Halvorsen was discharged from [facility] in Stable condition.  At the hospital follow up visit please address:  1.  Urinary Retention- Left with foley. patient should have called to schedule an outpatient urology appointment about 1 week after discharge.  2. UTI- culture grew 80,000 yeast. She is on a 2-week course of Diflucan. Assess for any further urinary symptoms.  3. Acute Renal Failure- significantly improved by discharge, recheck bmp  4. Brain lesions/CVAs- needs neurology follow up to get MRI in 4 weeks from discharge,  make sure patient has follow up scheduled. Also discuss whether to restart statin vs PCSK9.  5. Atrial fibrillation- eliquis held during inpatient stay due to concern for brain mass. Make sure patient restarted this on discharge as instructed.  6. HFpEF- patient did not require diuresis during admission, reassess volume status.  7. Diabetes- slowly restart gabapentin, discuss need to discontinue linagliptin considering significant weight loss  8.  Hypertension-patient's metoprolol and losartan were held in the setting of soft BPs. Pt's BP was slightly elevated at discharge.  Restart antihypertensives slowly at your discretion.   9.  Labs / imaging needed at time of follow-up: UA, CBC, BMP, MRI (4 weeks from discharge)  10.  Pending labs/ test needing follow-up: CSF studies (cytology, JC virus, acid fast culture, acid fast smear, fungus culture, histoplasma) quant gold, B1, MMA,  Follow-up Appointments:  Follow-up Information     [facility] Follow up.   Why: call our office once you are discharged to schedule appt to have your catheter removed Contact information: [place] [facility] Course by problem list: Enhancing occipital lobe lesions  Multiple CVAs Patient initially presented with a headache and CT/MRI obtained showed two adjacent enhancing lesions in the occipital lobe with mild edema that were indeterminate but concerning for metastasis vs an inflammatory process. Neurosurgery was consulted and felt that the lesions would be amenable to biopsy/resection. Surgery was scheduled however before patient was able to go for surgery she developed acute kidney failure. Mental status worsened and she had an acute neurologic change displaying truncal ataxia, dysmetria, and disdiadochokinesia. Repeat brain imaging was ordered and neurology was consulted. MRI brain with contrast had some sort of  proteinaceous material  of unclear etiology that was concerning for possible meningitis vs subarachnoid hemorrhage. Later this was determined to likely be retained gadolinium from prior imaging with delayed clearance due to poor renal function Other small punctate infarcts were also seen on this repeat brain imaging. Surgery was canceled in this interim due to worsening renal function and an LP was obtained instead. LP was reassuring and ruled out meningitis and subarachnoid hemorrhage. As patient's renal function improved her neurologic derangements improved as well. Given patient's improvements with lack of interventions neurology felt that subacute strokes might actually be driving her presentation. Patient was deemed able to get repeat imaging again in 4 weeks to better evaluate the etiology. However if patient had any new or worsening of her symptoms she can obtain imaging sooner. If on repeat imaging lesions appear atypical for stroke can reconsult neurosurgery for biopsy. Neurology was also concerned whether patient's statin could be contributing to her proximal weakness. This can be followed up as  an outpatient.  Patient has been referred to outpatient PT/OT for rehab.   2. Acute renal failure Urinary Retention Patient had a hypotensive episode shortly after admission followed by many contrasted studies. Creatinine increased over several days from a baseline of 0.8-0.9 all the way up to 4.19 with a GFR as low as 12. Patient also subsequently had difficulty voiding and was hesitant to receive an I/O. 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 Urology placed a foley on 11/10. When patient had foley placed >700cc of urine drained. Discussed patient with urology who deemed it appropriate to leave the foley in place upon discharge and to have her follow up with them as an outpatient. Appointment scheduled 11/18  at 10 AM.  3. UTI Patient at high risk for UTI given female circumcision. Ucx obtained 2/2 elevated creatinine grew 80,000 colonies of yeast. Patient to complete a two week course of diflucan 200 daily ending 11/25.  4. Diabetes Patient put 15 units semglee nightly and 5 units novolog TID with meals with moderate sliding scale as well as linagliptin while inpatient. However given patient's worsening renal function and ataxia patient's gabapentin was stopped. As patient's renal function improved this was able to be restarted at a lower dose of 300 mg at night.   5. Autonomic dysfunction Amlodipine Losartan and metoprolol were held during admission secondary to labile blood pressures. By the time of discharge amlodipine 2.5 mg was restarted. Metoprolol and losartan were held and could be discussed with her PCP as an outpatient.  6. Paroxysmal atrial fibrillation Patient remained in NSR while inpatient. Eliquis was held 2/2 concern for bleeding of lesions listed in problem 1 but was restarted on 11/12. Metoprolol was held and not restarted upon discharge (see above).  7. HFpEF Patient remained euvolemic with no SOB. Did not receive diuresis.  8. Emesis With patient's metabolic derangements and initial headache/possible stroke on admission patient was nauseous  and had during first few days of admission. Received zofran and initially metoclopramide but metoclopramide was discontinued given patient's neurologic derangements and ataxia.  Discharge Exam:   BP 129/62 (BP Location: Right Arm)   Pulse 73   Temp 98.3 F (36.8 C) (Oral)   Resp 18   Ht '5\' 6"'  (1.676 m)   Wt 68.1 kg   LMP 08/21/2016 (Exact Date)   SpO2 98%   BMI 24.23 kg/m  Discharge exam:  Constitutional: chronically ill appearing woman resting comfortably in bed in NAD  Eyes: conjunctiva non-erythematous Cardiovascular: regular rate and rhythm, 1/6 systolic murmur, extra heart sound Pulmonary/Chest: normal work of breathing on  room air, lungs CTAB Abdominal: soft, non-tender, non-distended, foley in place MSK: Ataxia almost completely resolved Neurological: alert, able to sit up in bed without assistance, Left Dysdiadochokinesia improving but improved from prior, no dysmetria  Skin: warm and dry, no LEE Psych: normal affect   Pertinent Labs, Studies, and Procedures:  CT HEAD WO CONTRAST (5MM)  Result Date: 02/27/2021 CLINICAL DATA:  Headache.  Hyperglycemia. EXAM: CT HEAD WITHOUT CONTRAST TECHNIQUE: Contiguous axial images were obtained from the base of the skull through the vertex without intravenous contrast. COMPARISON:  Head CT and MRI 02/25/2021 FINDINGS: Brain: A 2 cm mixed density/hemorrhagic lesion superiorly in the left occipital lobe has not significantly changed in size although mild surrounding edema appears slightly decreased. No new intracranial hemorrhage, new region of edema, acute cortically based infarct, midline shift,  or extra-axial fluid collection is identified. Hypodensities elsewhere in the cerebral white matter bilaterally are unchanged and nonspecific but compatible with mild chronic small vessel ischemic disease. Age advanced cerebral atrophy is again noted. Vascular: No hyperdense vessel. Skull: No fracture or suspicious osseous lesion. Sinuses/Orbits: Visualized paranasal sinuses and mastoid air cells are clear. Visualized orbits are unremarkable. Other: None. IMPRESSION: 1. Unchanged size of left occipital lobe lesion with slightly decreased surrounding edema. 2. No evidence of new intracranial abnormality. Electronically Signed   By: Logan Bores M.D.   On: 02/27/2021 14:05   CT HEAD WO CONTRAST (5MM)  Result Date: 02/25/2021 CLINICAL DATA:  Headache EXAM: CT HEAD WITHOUT CONTRAST TECHNIQUE: Contiguous axial images were obtained from the base of the skull through the vertex without intravenous contrast. COMPARISON:  CT head 01/29/2021 FINDINGS: Brain: Interval development of 2 cm hypodensity in  the posterior parietal lobe on the left. Possible small area of associated hemorrhage. Low-density area appears involve cortex and white matter. No other areas of new low-density identified.  Mild atrophy. Vascular: Negative for hyperdense vessel Skull: Negative Sinuses/Orbits: Negative Other: None IMPRESSION: New hypodensity left posterior parietal lobe, not present 1 month ago. Possible small area of adjacent hemorrhage. Differential includes hemorrhagic infarct versus mass lesion. Recommend MRI brain without with contrast for further evaluation. These results were called by telephone at the time of interpretation on 02/25/2021 at 3:12 pm to provider MARGAUX VENTER , who verbally acknowledged these results. Electronically Signed   By: Franchot Gallo M.D.   On: 02/25/2021 15:12   CT Head Wo Contrast  Result Date: 01/29/2021 CLINICAL DATA:  Delirium EXAM: CT HEAD WITHOUT CONTRAST TECHNIQUE: Contiguous axial images were obtained from the base of the skull through the vertex without intravenous contrast. COMPARISON:  CT head 11/07/2020 BRAIN: BRAIN Cerebral ventricle sizes are concordant with the degree of cerebral volume loss. Patchy and confluent areas of decreased attenuation are noted throughout the deep and periventricular white matter of the cerebral hemispheres bilaterally, compatible with chronic microvascular ischemic disease. No evidence of large-territorial acute infarction. No parenchymal hemorrhage. No mass lesion. No extra-axial collection. No mass effect or midline shift. No hydrocephalus. Basilar cisterns are patent. Vascular: No hyperdense vessel. Atherosclerotic calcifications are present within the cavernous internal carotid arteries. Skull: No acute fracture or focal lesion. Sinuses/Orbits: Paranasal sinuses and mastoid air cells are clear. Right lens replacement. Otherwise the orbits are unremarkable. Other: None. IMPRESSION: No acute intracranial abnormality. Electronically Signed   By:  Iven Finn M.D.   On: 01/29/2021 23:33   CT Cervical Spine Wo Contrast  Result Date: 02/25/2021 CLINICAL DATA:  Metastatic disease evaluation EXAM: CT CERVICAL SPINE WITHOUT CONTRAST TECHNIQUE: Multidetector CT imaging of the cervical spine was performed without intravenous contrast. Multiplanar CT image reconstructions were also generated. COMPARISON:  None. FINDINGS: Alignment: No static subluxation. Facets are aligned. Occipital condyles and the lateral masses of C1 and C2 are normally approximated. Skull base and vertebrae: No acute fracture. Soft tissues and spinal canal: No prevertebral fluid or swelling. No visible canal hematoma. Disc levels: No advanced spinal canal or neural foraminal stenosis. Upper chest: No pneumothorax, pulmonary nodule or pleural effusion. Other: Normal visualized paraspinal cervical soft tissues. IMPRESSION: No acute fracture or static subluxation of the cervical spine. No osseous metastatic disease. Electronically Signed   By: Ulyses Jarred M.D.   On: 02/25/2021 19:32   MR ANGIO HEAD WO CONTRAST  Result Date: 03/01/2021 CLINICAL DATA:  Headache and vomiting. Intracranial lesions suspicious for malignancy  or inflammatory disease. EXAM: MRA HEAD WITHOUT CONTRAST TECHNIQUE: Angiographic images of the Circle of Willis were acquired using MRA technique without intravenous contrast. COMPARISON:  No pertinent prior exam. FINDINGS: Anterior circulation: Smooth and widely patent carotid arteries. No branch occlusion. The distal right A1 segment appears ectatic but no generalized or definite beading. Mild irregularity of MCA branches, especially on the right, although likely accentuated by motion artifact. Posterior circulation: The vertebral and basilar arteries are smoothly contoured and widely patent. No branch occlusion, beading, or aneurysm. Mild irregularity of PCA branches. Anatomic variants: None significant IMPRESSION: Medium size vessel irregularity with medical  history suggesting premature atherosclerosis. There is 1 area of possible segmental dilatation such that an inflammatory vasculopathy is also considered. Electronically Signed   By: Jorje Guild M.D.   On: 03/01/2021 07:20   MR BRAIN W WO CONTRAST  Result Date: 02/28/2021 CLINICAL DATA:  Brain/CNS neoplasm, assess treatment response. EXAM: MRI HEAD WITHOUT AND WITH CONTRAST TECHNIQUE: Multiplanar, multiecho pulse sequences of the brain and surrounding structures were obtained without and with intravenous contrast. CONTRAST:  6.6m GADAVIST GADOBUTROL 1 MMOL/ML IV SOLN COMPARISON:  None. FINDINGS: Brain: Multiple punctate foci of restricted diffusion are seen within the bilateral cerebral hemispheres and a single focus is seen in the right cerebellar hemisphere consistent with acute infarcts. Diffuse FLAIR hyperintensity throughout the sulci in the bilateral cerebral convexity without corresponding T1 hyperintensity, susceptibility artifact or contrast enhancement, may represent hyperacute clean versus proteinaceous material. Ventricular size remains stable. No significant interval change of the 2 enhancing lesions in the left occipital measuring approximately 21 x 13 mm and 7 mm. No new enhancing lesion identified. Vascular: Normal flow voids. Skull and upper cervical spine: Normal marrow signal. Sinuses/Orbits: Right lens surgery.  Paranasal sinuses are clear. Other: None. IMPRESSION: 1. Prominent diffuse FLAIR hyperintensity within the cerebral sulci may represent proteinaceous material related to meningitis or, less likely, subarachnoid hemorrhage. 2. Multiple punctate acute infarcts in the bilateral cerebral hemispheres and right cerebellar hemisphere. 3. Unchanged appearance of 2 enhancing lesions in the left occipital lobe, may represent subacute infarct with hemorrhagic transformation versus malignancy. These results were called by telephone at the time of interpretation on 02/28/2021 at 9:39 am to  [facility] who verbally acknowledged these results. Electronically Signed   By: KPedro EarlsM.D.   On: 02/28/2021 09:40   MR BRAIN W WO CONTRAST  Result Date: 02/25/2021 CLINICAL DATA:  Brain mass or lesion. EXAM: MRI HEAD WITHOUT AND WITH CONTRAST TECHNIQUE: Multiplanar, multiecho pulse sequences of the brain and surrounding structures were obtained without and with intravenous contrast. CONTRAST:  768mGADAVIST GADOBUTROL 1 MMOL/ML IV SOLN COMPARISON:  Head CT 02/25/2021 and MRI 11/07/2020 FINDINGS: Brain: A heterogeneously enhancing lesion posteriorly in the superior portion of the left occipital lobe measures 21 x 13 mm with mild surrounding edema. Associated susceptibility artifact is compatible with old blood products. A separate, smaller adjacent enhancing lesion more inferiorly in the left occipital lobe measures 7 mm. Small T2 hyperintensities elsewhere in the cerebral white matter bilaterally are unchanged from the prior MRI and are nonspecific but compatible with mild chronic small vessel ischemic disease. Cerebral atrophy is moderately advanced for age. Vascular: Major intracranial vascular flow voids are preserved. Skull and upper cervical spine: Unremarkable bone marrow signal. Sinuses/Orbits: Right cataract extraction.  Clear paranasal sinuses. Other: None. IMPRESSION: 1. Two adjacent enhancing lesions in the left occipital lobe with mild edema, indeterminate but suspicious for metastases. 2. Mild  chronic small vessel ischemic disease and moderate cerebral atrophy. Electronically Signed   By: Logan Bores M.D.   On: 02/25/2021 18:05   MR CERVICAL SPINE WO CONTRAST  Result Date: 03/01/2021 CLINICAL DATA:  Low back pain with cauda equina suspected EXAM: MRI CERVICAL, THORACIC AND LUMBAR SPINE WITHOUT CONTRAST TECHNIQUE: Multiplanar and multiecho pulse sequences of the cervical spine, to include the craniocervical junction and cervicothoracic junction, and  thoracic and lumbar spine, were obtained without intravenous contrast. COMPARISON:  None. FINDINGS: MRI CERVICAL SPINE FINDINGS Alignment: Physiologic. Vertebrae: No fracture, evidence of discitis, or bone lesion. Cord: Normal signal and morphology. Posterior Fossa, vertebral arteries, paraspinal tissues: Empty left proximal transverse foramina due to late entry of the left vertebral artery. Disc levels: Small disc protrusions at C3-4 to C5-6. Negative facets. No neural impingement MRI THORACIC SPINE FINDINGS Alignment:  Normal alignment Vertebrae: No fracture, evidence of discitis, or bone lesion. Cord:  Normal signal and morphology. Paraspinal and other soft tissues: Heterogeneous of the bilateral kidneys with striated nephrogram appearance Disc levels: Diffusely preserved disc height and hydration. Negative facets. No neural impingement MRI LUMBAR SPINE FINDINGS Segmentation:  5 lumbar type vertebrae Alignment:  Physiologic. Vertebrae:  No fracture, evidence of discitis, or bone lesion. Conus medullaris and cauda equina: Conus extends to the L1-2 level. Conus and cauda equina appear normal. Paraspinal and other soft tissues: Full bladder without detected wall thickening. Prominent thickness of the rectum but negative by preceding CT. Disc levels: Diffusely preserved disc height and hydration. Up to mild ventral spondylitic spurring. Left foraminal herniation up lifting the left L4 nerve root. IMPRESSION: 1. Normal MRI of the cord. 2. Heterogeneity of the bilateral kidneys often seen with pyelonephritis or nephritis. 3. L4-5 left foraminal protrusion contacting the L4 nerve root. Electronically Signed   By: Jorje Guild M.D.   On: 03/01/2021 07:26   MR THORACIC SPINE WO CONTRAST  Result Date: 03/01/2021 CLINICAL DATA:  Low back pain with cauda equina suspected EXAM: MRI CERVICAL, THORACIC AND LUMBAR SPINE WITHOUT CONTRAST TECHNIQUE: Multiplanar and multiecho pulse sequences of the cervical spine, to  include the craniocervical junction and cervicothoracic junction, and thoracic and lumbar spine, were obtained without intravenous contrast. COMPARISON:  None. FINDINGS: MRI CERVICAL SPINE FINDINGS Alignment: Physiologic. Vertebrae: No fracture, evidence of discitis, or bone lesion. Cord: Normal signal and morphology. Posterior Fossa, vertebral arteries, paraspinal tissues: Empty left proximal transverse foramina due to late entry of the left vertebral artery. Disc levels: Small disc protrusions at C3-4 to C5-6. Negative facets. No neural impingement MRI THORACIC SPINE FINDINGS Alignment:  Normal alignment Vertebrae: No fracture, evidence of discitis, or bone lesion. Cord:  Normal signal and morphology. Paraspinal and other soft tissues: Heterogeneous of the bilateral kidneys with striated nephrogram appearance Disc levels: Diffusely preserved disc height and hydration. Negative facets. No neural impingement MRI LUMBAR SPINE FINDINGS Segmentation:  5 lumbar type vertebrae Alignment:  Physiologic. Vertebrae:  No fracture, evidence of discitis, or bone lesion. Conus medullaris and cauda equina: Conus extends to the L1-2 level. Conus and cauda equina appear normal. Paraspinal and other soft tissues: Full bladder without detected wall thickening. Prominent thickness of the rectum but negative by preceding CT. Disc levels: Diffusely preserved disc height and hydration. Up to mild ventral spondylitic spurring. Left foraminal herniation up lifting the left L4 nerve root. IMPRESSION: 1. Normal MRI of the cord. 2. Heterogeneity of the bilateral kidneys often seen with pyelonephritis or nephritis. 3. L4-5 left foraminal protrusion contacting the L4 nerve root. Electronically  Signed   By: Jorje Guild M.D.   On: 03/01/2021 07:26   MR LUMBAR SPINE WO CONTRAST  Result Date: 03/01/2021 CLINICAL DATA:  Low back pain with cauda equina suspected EXAM: MRI CERVICAL, THORACIC AND LUMBAR SPINE WITHOUT CONTRAST TECHNIQUE:  Multiplanar and multiecho pulse sequences of the cervical spine, to include the craniocervical junction and cervicothoracic junction, and thoracic and lumbar spine, were obtained without intravenous contrast. COMPARISON:  None. FINDINGS: MRI CERVICAL SPINE FINDINGS Alignment: Physiologic. Vertebrae: No fracture, evidence of discitis, or bone lesion. Cord: Normal signal and morphology. Posterior Fossa, vertebral arteries, paraspinal tissues: Empty left proximal transverse foramina due to late entry of the left vertebral artery. Disc levels: Small disc protrusions at C3-4 to C5-6. Negative facets. No neural impingement MRI THORACIC SPINE FINDINGS Alignment:  Normal alignment Vertebrae: No fracture, evidence of discitis, or bone lesion. Cord:  Normal signal and morphology. Paraspinal and other soft tissues: Heterogeneous of the bilateral kidneys with striated nephrogram appearance Disc levels: Diffusely preserved disc height and hydration. Negative facets. No neural impingement MRI LUMBAR SPINE FINDINGS Segmentation:  5 lumbar type vertebrae Alignment:  Physiologic. Vertebrae:  No fracture, evidence of discitis, or bone lesion. Conus medullaris and cauda equina: Conus extends to the L1-2 level. Conus and cauda equina appear normal. Paraspinal and other soft tissues: Full bladder without detected wall thickening. Prominent thickness of the rectum but negative by preceding CT. Disc levels: Diffusely preserved disc height and hydration. Up to mild ventral spondylitic spurring. Left foraminal herniation up lifting the left L4 nerve root. IMPRESSION: 1. Normal MRI of the cord. 2. Heterogeneity of the bilateral kidneys often seen with pyelonephritis or nephritis. 3. L4-5 left foraminal protrusion contacting the L4 nerve root. Electronically Signed   By: Jorje Guild M.D.   On: 03/01/2021 07:26   CARDIAC CATHETERIZATION  Result Date: 02/05/2021   Dist LAD lesion is 35% stenosed.   The left ventricular systolic  function is normal.   LV end diastolic pressure is normal. 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 Treat with Betablocker, ARB and atorvastatin. Life-style modification with heart healthy diet and activity. May resume Eliquis from tomorrow.   US RENAL  Result Date: 02/28/2021 CLINICAL DATA:  Acute kidney injury EXAM: RENAL / URINARY TRACT ULTRASOUND COMPLETE COMPARISON:  CT 02/03/2021 FINDINGS: Right Kidney: Renal measurements: 12.2 x 4.6 x 4.2 cm = volume: 123 mL. Increased echogenicity. No hydronephrosis or focal lesion. Left Kidney: Renal measurements: 11.4 x 5.9 by 4.9 cm = volume: 174 mL. Increased echogenicity. No hydronephrosis or focal lesion. Bladder: Appears normal for degree of bladder distention. Other: None. IMPRESSION: Increased echogenicity of the renal parenchyma with normal size, consistent with acute kidney injury or nephritis. No obstruction or focal lesion. Electronically Signed   By: Nelson Chimes M.D.   On: 02/28/2021 19:40   CT CHEST ABDOMEN PELVIS W CONTRAST  Result Date: 02/25/2021 CLINICAL DATA:  Intracranial mass.  Search for metastatic disease EXAM: CT CHEST, ABDOMEN, AND PELVIS WITH CONTRAST TECHNIQUE: Multidetector CT imaging of the chest, abdomen and pelvis was performed following the standard protocol during bolus administration of intravenous contrast. CONTRAST:  5m OMNIPAQUE IOHEXOL 350 MG/ML SOLN COMPARISON:  None. FINDINGS: CT CHEST FINDINGS Cardiovascular: No significant vascular findings. Normal heart size. No pericardial effusion. Mediastinum/Nodes: No enlarged mediastinal, hilar, or axillary lymph nodes. Thyroid gland, trachea, and esophagus demonstrate no significant findings. Lungs/Pleura: Lungs are clear. No pleural effusion or pneumothorax. Musculoskeletal: No chest wall mass or suspicious bone lesions identified. CT ABDOMEN PELVIS FINDINGS Hepatobiliary: No  focal liver abnormality is seen. No gallstones, gallbladder wall thickening, or biliary dilatation. Pancreas:  Unremarkable. No pancreatic ductal dilatation or surrounding inflammatory changes. Spleen: No splenic injury or perisplenic hematoma. Adrenals/Urinary Tract: Adrenal glands are unremarkable. Kidneys are normal, without renal calculi, focal lesion, or hydronephrosis. Bladder is unremarkable. Stomach/Bowel: Stomach is within normal limits. Appendix appears normal. No evidence of bowel wall thickening, distention, or inflammatory changes. Vascular/Lymphatic: No significant vascular findings are present. No enlarged abdominal or pelvic lymph nodes. Reproductive: Uterus and bilateral adnexa are unremarkable. Other: No abdominal wall hernia or abnormality. No abdominopelvic ascites. Musculoskeletal: No acute or significant osseous findings. IMPRESSION: No evidence of malignancy in the chest, abdomen or pelvis. Mammography would be necessary to fully assess the breast tissue. Electronically Signed   By: Ulyses Jarred M.D.   On: 02/25/2021 19:36   DG CHEST PORT 1 VIEW  Result Date: 02/27/2021 CLINICAL DATA:  Headache, hyperglycemia, sleepy state EXAM: PORTABLE CHEST 1 VIEW COMPARISON:  Portable exam 1225 hours compared to 02/03/2021 FINDINGS: Enlargement of cardiac silhouette. Mediastinal contours and pulmonary vascularity normal. Lungs clear. No infiltrate, pleural effusion, or pneumothorax. 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 IMPRESSION: Enlargement of cardiac silhouette without acute infiltrate. Electronically Signed   By: Lavonia Dana M.D.   On: 02/27/2021 14:19   DG CHEST PORT 1 VIEW  Result Date: 02/03/2021 CLINICAL DATA:  Confusion, vomiting.  Recent UTI.  Hypertension. EXAM: PORTABLE CHEST 1 VIEW COMPARISON:  11/07/2020 FINDINGS: Heart and mediastinal contours are within normal limits. No focal opacities or effusions. No acute bony abnormality. IMPRESSION: No active disease. Electronically Signed   By: Rolm Baptise M.D.   On: 02/03/2021 20:34   DG Foot 2 Views Right  Result  Date: 01/26/2021 Please see detailed radiograph report in office note.  EEG adult  Result Date: 03/01/2021 Lora Havens, MD     03/01/2021  1:14 PM Patient Name: Breonna Gafford MRN: [id number] Epilepsy Attending: Lora Havens Referring Physician/Provider: Dr Lesleigh Noe Date: 03/01/2021 Duration: 23.11 mins Patient history: 50 year old female with altered mental status and tremors /asterixis.  EEG to evaluate for seizures. Level of alertness: Awake AEDs during EEG study: None Technical aspects: This EEG study was done with scalp electrodes positioned according to the 10-20 International system of electrode placement. Electrical activity was acquired at a sampling rate of '500Hz'  and reviewed with a high frequency filter of '70Hz'  and a low frequency filter of '1Hz' . EEG data were recorded continuously and digitally stored. Description: The posterior dominant rhythm consists of 8-'9Hz'  activity of moderate voltage (25-35 uV) seen predominantly in posterior head regions, symmetric and reactive to eye opening and eye closing. EEG showed intermittent 3 to 6 Hz theta-delta slowing. Hyperventilation and photic stimulation were not performed.   Of note, study was technically difficult due to significant electrode and movement artifact. ABNORMALITY - Intermittent slow, generalized IMPRESSION: This technically difficult study is suggestive of mild diffuse encephalopathy, nonspecific etiology. No seizures or epileptiform discharges were seen throughout the recording. Lora Havens   ECHOCARDIOGRAM COMPLETE  Result Date: 02/04/2021    ECHOCARDIOGRAM REPORT   Patient Name:   TALIYA MCCLARD Date of Exam: 02/04/2021 Medical Rec #:  [id number]      Height:       64.0 in Accession #:    [id number]     Weight:       160.1 lb Date of Birth:  10-23-1970       BSA:  1.779 m Patient Age:    36 years       BP:           185/83 mmHg Patient Gender: F              HR:           81 bpm. Exam Location:  Inpatient  Procedure: Cardiac Doppler, Color Doppler and 2D Echo Indications:     Elevated Troponin  History:         Patient has prior history of Echocardiogram examinations, most                  recent 03/16/2020.  Sonographer:     Merrie Roof RDCS Referring Phys:  Dixie Dials MD Diagnosing Phys: Dixie Dials MD IMPRESSIONS  1. Left ventricular ejection fraction, by estimation, is 60 to 65%. The left ventricle has normal function. The left ventricle has no regional wall motion abnormalities. Left ventricular diastolic parameters are consistent with Grade I diastolic dysfunction (impaired relaxation).  2. Right ventricular systolic function is normal. The right ventricular size is normal. There is normal pulmonary artery systolic pressure.  3. Left atrial size was moderately dilated.  4. Right atrial size was mildly dilated.  5. The pericardial effusion is posterior to the left ventricle.  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  7. The aortic valve is tricuspid. Aortic valve regurgitation is not visualized.  8. The inferior vena cava is dilated in size with <50% respiratory variability, suggesting right atrial pressure of 15 mmHg. FINDINGS  Left Ventricle: Left ventricular ejection fraction, by estimation, is 60 to 65%. The left ventricle has normal function. The left ventricle has no regional wall motion abnormalities. The left ventricular internal cavity size was normal in size. There is  no left ventricular hypertrophy. Left ventricular diastolic parameters are consistent with Grade I diastolic dysfunction (impaired relaxation). Right Ventricle: The right ventricular size is normal. No increase in right ventricular wall thickness. Right ventricular systolic function is normal. There is normal pulmonary artery systolic pressure. The tricuspid regurgitant velocity is 2.49 m/s, and  with an assumed right atrial pressure of 3 mmHg, the estimated right ventricular systolic pressure is 58.2  mmHg. Left Atrium: Left atrial size was moderately dilated. Right Atrium: Right atrial size was mildly dilated. Pericardium: Trivial pericardial effusion is present. The pericardial effusion is posterior to the left ventricle. Mitral Valve: The mitral valve is myxomatous. Moderate mitral valve regurgitation. Tricuspid Valve: The tricuspid valve is normal in structure. Tricuspid valve regurgitation is mild. Aortic Valve: The aortic valve is tricuspid. Aortic valve regurgitation is not visualized. Aortic valve mean gradient measures 4.0 mmHg. Aortic valve peak gradient measures 7.2 mmHg. Pulmonic Valve: The pulmonic valve was normal in structure. Pulmonic valve regurgitation is not visualized. Aorta: The aortic root is normal in size and structure. Venous: The inferior vena cava is dilated in size with less than 50% respiratory variability, suggesting right atrial pressure of 15 mmHg. IAS/Shunts: The atrial septum is grossly normal.  LEFT VENTRICLE PLAX 2D LVIDd:         4.30 cm Diastology LVIDs:         2.80 cm LV e' medial:    6.64 cm/s LV PW:         1.00 cm LV E/e' medial:  15.4 LV IVS:        1.00 cm LV e' lateral:   7.29 cm/s  LV E/e' lateral: 14.0  RIGHT VENTRICLE          IVC RV Basal diam:  3.00 cm  IVC diam: 2.30 cm LEFT ATRIUM             Index        RIGHT ATRIUM           Index LA diam:        4.30 cm 2.42 cm/m   RA Area:     10.90 cm LA Vol (A2C):   62.4 ml 35.07 ml/m  RA Volume:   20.40 ml  11.47 ml/m LA Vol (A4C):   53.3 ml 29.96 ml/m LA Biplane Vol: 58.5 ml 32.88 ml/m  AORTIC VALVE AV Vmax:           134.00 cm/s AV Vmean:          99.500 cm/s AV VTI:            0.314 m AV Peak Grad:      7.2 mmHg AV Mean Grad:      4.0 mmHg LVOT Vmax:         94.00 cm/s LVOT Vmean:        65.500 cm/s LVOT VTI:          0.225 m LVOT/AV VTI ratio: 0.72  AORTA Ao Root diam: 2.70 cm Ao Asc diam:  2.90 cm MITRAL VALVE                TRICUSPID VALVE MV Area (PHT): 5.97 cm     TR Peak grad:    24.8 mmHg MV Decel Time: 127 msec     TR Vmax:        249.00 cm/s MV E velocity: 102.00 cm/s MV A velocity: 93.00 cm/s   SHUNTS MV E/A ratio:  1.10         Systemic VTI: 0.22 m Dixie Dials MD Electronically signed by Dixie Dials MD Signature Date/Time: 02/04/2021/3:02:39 PM    Final    CT Renal Stone Study  Result Date: 02/03/2021 CLINICAL DATA:  Flank pain, recent UTI EXAM: CT ABDOMEN AND PELVIS WITHOUT CONTRAST TECHNIQUE: Multidetector CT imaging of the abdomen and pelvis was performed following the standard protocol without IV contrast. COMPARISON:  05/16/2020 FINDINGS: Lower chest: Lung bases are clear. Hepatobiliary: Unenhanced liver is unremarkable. Suspected gallbladder sludge versus noncalcified gallstones, without associated inflammatory changes. No intrahepatic or extrahepatic ductal dilatation. Pancreas: Within normal limits. Spleen: Within normal limits. Adrenals/Urinary Tract: Adrenal glands are within normal limits. Kidneys are within normal limits. No renal calculi or hydronephrosis. Bladder is within normal limits. Stomach/Bowel: Stomach is within normal limits. No evidence of bowel obstruction. Normal appendix (series 2/image 52). No colonic wall thickening or inflammatory changes. Vascular/Lymphatic: No evidence of abdominal aortic aneurysm. Mild vascular calcifications. No suspicious abdominopelvic lymphadenopathy. Reproductive: Anterior position of the uterus suggests prior C-section. No adnexal masses. Other: No abdominopelvic ascites. Musculoskeletal: Visualized osseous structures are within normal limits. IMPRESSION: No renal, ureteral, or bladder calculi.  No hydronephrosis. Suspected gallbladder sludge versus noncalcified gallstones, without associated inflammatory changes. Electronically Signed   By: Julian Hy M.D.   On: 02/03/2021 19:18   VAS US CAROTID  Result Date: 03/03/2021 Carotid Arterial Duplex Study Patient Name:  SHAKEITHA UMBAUGH  Date of Exam:   03/03/2021  Medical Rec #: [id number]       Accession #:    [id number] Date of Birth: 01/04/1971        Patient Gender: F Patient Age:  50 years Exam Location:  [facility] Procedure:      VAS US CAROTID Referring Phys: Dorian Pod --------------------------------------------------------------------------------  Indications:       CVA. Risk Factors:      Hypertension, hyperlipidemia, Diabetes. Comparison Study:  No prior study Performing Technologist: Maudry Mayhew MHA, RDMS, RVT, RDCS  Examination Guidelines: A complete evaluation includes B-mode imaging, spectral Doppler, color Doppler, and power Doppler as needed of all accessible portions of each vessel. Bilateral testing is considered an integral part of a complete examination. Limited examinations for reoccurring indications may be performed as noted.  Right Carotid Findings: +----------+--------+--------+--------+------------------+--------+           PSV cm/sEDV cm/sStenosisPlaque DescriptionComments +----------+--------+--------+--------+------------------+--------+ CCA Prox  76      13                                         +----------+--------+--------+--------+------------------+--------+ CCA Distal92      21                                         +----------+--------+--------+--------+------------------+--------+ ICA Prox  70      16                                         +----------+--------+--------+--------+------------------+--------+ ICA Distal92      32                                         +----------+--------+--------+--------+------------------+--------+ ECA       72                                                 +----------+--------+--------+--------+------------------+--------+ +----------+--------+-------+----------------+-------------------+           PSV cm/sEDV cmsDescribe        Arm Pressure (mmHG) +----------+--------+-------+----------------+-------------------+ [id number]             Multiphasic, WNL                    +----------+--------+-------+----------------+-------------------+ +---------+--------+--+--------+--+---------+ VertebralPSV cm/s75EDV cm/s22Antegrade +---------+--------+--+--------+--+---------+  Left Carotid Findings: +----------+--------+--------+--------+-----------------------+--------+           PSV cm/sEDV cm/sStenosisPlaque Description     Comments +----------+--------+--------+--------+-----------------------+--------+ CCA Prox  84      14                                              +----------+--------+--------+--------+-----------------------+--------+ CCA Distal90      22              smooth and heterogenous         +----------+--------+--------+--------+-----------------------+--------+ ICA Prox  84      22                                              +----------+--------+--------+--------+-----------------------+--------+  ICA Distal66      21                                              +----------+--------+--------+--------+-----------------------+--------+ ECA       101     11                                              +----------+--------+--------+--------+-----------------------+--------+ +----------+--------+--------+----------------+-------------------+           PSV cm/sEDV cm/sDescribe        Arm Pressure (mmHG) +----------+--------+--------+----------------+-------------------+ [id number]             Multiphasic, WNL                    +----------+--------+--------+----------------+-------------------+ +---------+--------+--+--------+--+---------+ VertebralPSV cm/s34EDV cm/s11Antegrade +---------+--------+--+--------+--+---------+   Summary: Right Carotid: Velocities in the right ICA are consistent with a 1-39% stenosis. Left Carotid: Velocities in the left ICA are consistent with a 1-39% stenosis. Vertebrals:  Bilateral vertebral arteries demonstrate antegrade flow.  Subclavians: Normal flow hemodynamics were seen in bilateral subclavian              arteries. *See table(s) above for measurements and observations.     Preliminary      Discharge Instructions: Discharge Instructions     Ambulatory referral to Neurology   Complete by: As directed    An appointment is requested in approximately: 2 weeks      Ms. Kymiah Brodt  You were recently hospitalized at [facility] for abdominal pain and a headache. We found some abnormalities on your brain picture that are concerning for stroke, though the pictures were not 100% clear. You are recommended to follow up with the neurologist in 1 months time to get another picture taken to confirm this diagnosis. Your kidneys also showed some damage during the hospital stay. We have adjusted some of your medications as a result.  Stop taking these medications: atorvastatin (lipitor), losartan (cozaar), metoclopramide (reglan), metoprolol ( lopressor), and midodrine (proamatine) 2. Change how you rake these medications: gabapentin- take once a day before bed rather than three times a day  3. Start taking: Fluconazole (diflucan) once a day for 12 days. Stop taking this when you run out of medication.  4. Urinary catheter- you have a follow up appointment scheduled with the bladder doctors to try and remove the catheter and let you pee on your own. This appointment is Friday at 10 AM with [facility].  Please follow up with [place] in the next week in clinic.   We recommend that you seek further care if you start experiencing new weakness, increasing dizziness, one sided facial droop, or difficulty speaking.  Signed: Lacinda Axon, MD 03/05/2021, 3:12 PM   Pager: [phone]

## 2021-03-03 NOTE — Progress Notes (Signed)
Carotid artery duplex completed. Refer to "CV Proc" under chart review to view preliminary results.  03/03/2021 8:38 AM Kelby Aline., MHA, RVT, RDCS, RDMS

## 2021-03-03 NOTE — Plan of Care (Signed)
Neurology plan of care note:  Medicine team ordered carotid US yesterday and preliminary report with too mild to treat stenosis surgically.   CSF results thus far are not concerning. Gram stain is + for lymphocytes.    Given her weakness is likely debility and sedentary lifestyle and we believe her mental state is attributed to multifactorial encephalopathy, neuro will be available PRN for questions.   -f/up TB, fungal and bacterial cultures, VDRL, JC virus PCR, cell count, acid fast smear, histoplasma antigen (will also obtain in urine), toxoplasmosis, aspergillus, fungitell, f/up with out patient neurology if  not resulted prior to discharge.  -f/up nutritional labs: MMA, B6, Vitamin E, and copper.  Plan discussed with Dr. Curly Shores who approves.   Jennifer Boll, MSN, APN-BC Neurology Nurse Practitioner Pager [phone]

## 2021-03-04 DIAGNOSIS — Z794 Long term (current) use of insulin: Secondary | ICD-10-CM

## 2021-03-04 DIAGNOSIS — E11319 Type 2 diabetes mellitus with unspecified diabetic retinopathy without macular edema: Secondary | ICD-10-CM | POA: Diagnosis not present

## 2021-03-04 DIAGNOSIS — N179 Acute kidney failure, unspecified: Secondary | ICD-10-CM

## 2021-03-04 DIAGNOSIS — E1140 Type 2 diabetes mellitus with diabetic neuropathy, unspecified: Secondary | ICD-10-CM

## 2021-03-04 DIAGNOSIS — N390 Urinary tract infection, site not specified: Secondary | ICD-10-CM

## 2021-03-04 DIAGNOSIS — I5032 Chronic diastolic (congestive) heart failure: Secondary | ICD-10-CM

## 2021-03-04 DIAGNOSIS — C799 Secondary malignant neoplasm of unspecified site: Secondary | ICD-10-CM

## 2021-03-04 DIAGNOSIS — G939 Disorder of brain, unspecified: Secondary | ICD-10-CM | POA: Diagnosis not present

## 2021-03-04 DIAGNOSIS — R339 Retention of urine, unspecified: Secondary | ICD-10-CM

## 2021-03-04 LAB — CBC
HCT: 28.3 % — ABNORMAL LOW (ref 36.0–46.0)
Hemoglobin: 8.9 g/dL — ABNORMAL LOW (ref 12.0–15.0)
MCH: 27.6 pg (ref 26.0–34.0)
MCHC: 31.4 g/dL (ref 30.0–36.0)
MCV: 87.9 fL (ref 80.0–100.0)
Platelets: 190 10*3/uL (ref 150–400)
RBC: 3.22 MIL/uL — ABNORMAL LOW (ref 3.87–5.11)
RDW: 11.9 % (ref 11.5–15.5)
WBC: 6 10*3/uL (ref 4.0–10.5)
nRBC: 0 % (ref 0.0–0.2)

## 2021-03-04 LAB — RENAL FUNCTION PANEL
Albumin: 2.2 g/dL — ABNORMAL LOW (ref 3.5–5.0)
Anion gap: 9 (ref 5–15)
BUN: 25 mg/dL — ABNORMAL HIGH (ref 6–20)
CO2: 25 mmol/L (ref 22–32)
Calcium: 8.9 mg/dL (ref 8.9–10.3)
Chloride: 103 mmol/L (ref 98–111)
Creatinine, Ser: 1.29 mg/dL — ABNORMAL HIGH (ref 0.44–1.00)
GFR, Estimated: 51 mL/min — ABNORMAL LOW (ref >60)
Glucose, Bld: 205 mg/dL — ABNORMAL HIGH (ref 70–99)
Phosphorus: 4.3 mg/dL (ref 2.5–4.6)
Potassium: 4 mmol/L (ref 3.5–5.1)
Sodium: 137 mmol/L (ref 135–145)

## 2021-03-04 LAB — CULTURE, BLOOD (ROUTINE X 2)
Culture: NO GROWTH
Culture: NO GROWTH
Special Requests: ADEQUATE
Special Requests: ADEQUATE

## 2021-03-04 LAB — GLUCOSE, CAPILLARY
Glucose-Capillary: 161 mg/dL — ABNORMAL HIGH (ref 70–99)
Glucose-Capillary: 248 mg/dL — ABNORMAL HIGH (ref 70–99)
Glucose-Capillary: 195 mg/dL — ABNORMAL HIGH (ref 70–99)
Glucose-Capillary: 298 mg/dL — ABNORMAL HIGH (ref 70–99)

## 2021-03-04 LAB — VITAMIN B6: Vitamin B6: 11.8 ug/L (ref 3.4–65.2)

## 2021-03-04 MED ORDER — AMLODIPINE BESYLATE 2.5 MG PO TABS
2.5000 mg | ORAL_TABLET | Freq: Two times a day (BID) | ORAL | Status: DC
  Administered 2021-03-04 – 2021-03-05 (×2): 2.5 mg via ORAL
  Filled 2021-03-04 (×2): qty 1

## 2021-03-04 MED ORDER — FLUCONAZOLE 200 MG PO TABS
200.0000 mg | ORAL_TABLET | Freq: Every day | ORAL | Status: DC
  Administered 2021-03-04 – 2021-03-05 (×2): 200 mg via ORAL
  Filled 2021-03-04 (×2): qty 1

## 2021-03-04 MED ORDER — ACETAMINOPHEN 500 MG PO TABS
1000.0000 mg | ORAL_TABLET | Freq: Once | ORAL | Status: AC
  Administered 2021-03-04: 1000 mg via ORAL
  Filled 2021-03-04: qty 2

## 2021-03-04 NOTE — Progress Notes (Signed)
PHARMACIST - PHYSICIAN COMMUNICATION   CONCERNING: Antibiotic IV to Oral Route Change Policy   RECOMMENDATION: This patient is receiving fluconazole by the intravenous route. Based on criteria approved by the Pharmacy and Therapeutics Committee, the antibiotic(s) is/are being converted to the equivalent oral dose form(s).   DESCRIPTION:  These criteria include: Patient being treated for a respiratory tract infection, urinary tract infection, cellulitis or clostridium difficile associated diarrhea if on metronidazole The patient is not neutropenic and does not exhibit a GI malabsorption state The patient is eating (either orally or via tube) and/or has been taking other orally administered medications for a least 24 hours The patient is improving clinically and has a Tmax < 100.5   If you have questions about this conversion, please contact the Pharmacy Department   [x]   [phone] )  [place], PharmD PGY2 Infectious Diseases Pharmacy Resident   Please check [email or web address] for unit-specific pharmacy phone numbers

## 2021-03-04 NOTE — Progress Notes (Signed)
HD#6 Subjective:  Overnight Events: NAEO  Patient was evaluated at the bedside laying comfortably in bed with son in the room.  She reports some discomfort with the Foley but denies any fevers, abdominal pain, shortness of breath or dizziness.  States her weakness is getting better.  Objective:  Vital signs in last 24 hours: Vitals:   03/03/21 2358 03/04/21 0353 03/04/21 0815 03/04/21 1217  BP: 123/76 (!) 143/69 117/65 (!) 142/61  Pulse: 68 75 72 75  Resp: 15 14 17 19   Temp: (!) 97.3 F (36.3 C) 98.1 F (36.7 C) 98.6 F (37 C) 99 F (37.2 C)  TempSrc: Oral Oral Oral Oral  SpO2: 100% 100% 99% 100%  Weight:      Height:       Supplemental O2: Room Air SpO2: 100 %   Physical Exam:  Constitutional: Well-appearing middle-age woman laying in bed. Cardiovascular: RRR. I/VI systolic murmur present.  No rubs. Pulmonary/Chest: Lungs CTAB. Abdominal: Soft.  NT/ND.  Normal BS. MSK: Mild ataxia. GU: Foley in place draining yellowish urine Neurological: Alert and oriented x3. Sits up in bed without assistance. Mild left dysdiadochokinesia.  Resolved dysmetria. Skin: warm and dry  Filed Weights   02/27/21 0045  Weight: 68.1 kg     Intake/Output Summary (Last 24 hours) at 03/04/2021 1537 Last data filed at 03/04/2021 0900 Gross per 24 hour  Intake 346.67 ml  Output 1500 ml  Net -1153.33 ml   Net IO Since Admission: -3,319.66 mL [03/04/21 1537]  Pertinent Labs: CBC Latest Ref Rng & Units 03/04/2021 03/03/2021 03/02/2021  WBC 4.0 - 10.5 K/uL 6.0 6.7 7.7  Hemoglobin 12.0 - 15.0 g/dL 8.9(L) 8.9(L) 9.1(L)  Hematocrit 36.0 - 46.0 % 28.3(L) 27.8(L) 28.3(L)  Platelets 150 - 400 K/uL 190 184 148(L)    CMP Latest Ref Rng & Units 03/04/2021 03/03/2021 03/02/2021  Glucose 70 - 99 mg/dL 205(H) 119(H) 122(H)  BUN 6 - 20 mg/dL 25(H) 37(H) 61(H)  Creatinine 0.44 - 1.00 mg/dL 1.29(H) 1.49(H) 3.09(H)  Sodium 135 - 145 mmol/L 137 137 136  Potassium 3.5 - 5.1 mmol/L 4.0 3.7 4.5   Chloride 98 - 111 mmol/L 103 104 103  CO2 22 - 32 mmol/L 25 26 26   Calcium 8.9 - 10.3 mg/dL 8.9 9.4 9.5  Total Protein 6.5 - 8.1 g/dL - - 5.7(L)  Total Bilirubin 0.3 - 1.2 mg/dL - - 0.3  Alkaline Phos 38 - 126 U/L - - 106  AST 15 - 41 U/L - - 41  ALT 0 - 44 U/L - - 32    Imaging: No results found.  Assessment/Plan:   Principal Problem:   Brain lesion Active Problems:   AKI (acute kidney injury) ([place])   Weight loss, non-intentional   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 ([place])   Physical deconditioning   Patient Summary: Jennifer [name] is a 50 y.o. 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   #Two enhancing lesions in the left occipital lobe with mild edema # Multifocal strokes on MRI brain Complex situation with symptoms ultimately thought to be due to subacute stroke.  Malignancy still not completely ruled out without a biopsy of the lesion.  Patient has made significant improvement in the weakness and ataxia.  Plan for home health with PT and follow-up with neurology for reimaging in a few weeks.   --Home health with PT likely tomorrow --Repeat imaging in 4 weeks with neurology --Follow-up CSF labs --Patient will need hospital follow-up in [facility] clinic   #Acute renal Failure; multifactorial #Urinary retention AKI likely secondary to combination of urinary retention, contrast exposure and hypotensive episodes.  Kidney function continues to improve with Foley.  Creatinine down to 1.29 with a BUN of 25.  Making adequate urine via Foley.  Endorsed some discomfort with the Foley in place but no signs of infection on UA.  Plan for discharge with Foley in  place and 1 week follow-up with urology for voiding trial.   --Continue Foley --Avoid nephrotoxic agent --Monitor kidney function   #UTI Urine culture growing 80,000. Treating with Diflucan 200 mg for 2 weeks given patient is at increased risk of yeast infection and UTI due to female circumcision and Foley. -Continue Diflucan 200 mg daily (end 11/25)  #Poorly controlled insulin-dependent type 2 diabetes #Hyperglycemia #Diabetic neuropathy and diabetic retinopathy #Concern for Gastroparesis Patient has history of poorly controlled diabetes, last hemoglobin A1c 10/16 was 12.1% with sugars in the 300s on admission.  CBGs slightly improved to the 200s and 100s. --Restart metformin at discharge --Continue SSI with meals --Continue Semglee 15 units at night and NovoLog 5 units with meal --Continue Tradjenta 5 mg daily  #Soft BP; improving #Suspected Autonomic Dysfunction Suspect diabetic autonomic neuropathy causing hyper and hypotension. Patient has previously been prescribed midodrine for hypotension. Pressure improved after fluid bolus and maintenance fluids on 11/9.  SBP in the 110s to 140s today. --Checking orthostatic vitals today   #Paroxysmal Atrial Fibrillation On Eliquis at home. Continues to be in normal sinus with HR in the 70s. Not currently in A. fib. Intracranial bleeding risk secondary to brain lesions suspected mets. --Restart Eliquis 5 mg twice daily   #HFpEF: Chronic and stable #Nausea; resolved  Diet: Heart Healthy IVF: None,None VTE: Eliquis Code: Full PT/OT recs: Home health with PT  Lacinda Axon, MD 03/04/2021, 4:10 PM IM Resident, PGY-2 Pager: [phone] Internal Medicine Teaching Service  Please contact the on call pager after 5 pm and on weekends at [phone].

## 2021-03-04 NOTE — Plan of Care (Signed)
  Problem: Education: Goal: Knowledge of General Education information will improve Description: Including pain rating scale, medication(s)/side effects and non-pharmacologic comfort measures Outcome: Progressing   Problem: Health Behavior/Discharge Planning: Goal: Ability to manage health-related needs will improve Outcome: Progressing   Problem: Clinical Measurements: Goal: Ability to maintain clinical measurements within normal limits will improve Outcome: Progressing Goal: Will remain free from infection Outcome: Progressing Goal: Diagnostic test results will improve Outcome: Progressing Goal: Respiratory complications will improve Outcome: Progressing Goal: Cardiovascular complication will be avoided Outcome: Progressing   Problem: Coping: Goal: Level of anxiety will decrease Outcome: Progressing   Problem: Elimination: Goal: Will not experience complications related to bowel motility Outcome: Progressing   Problem: Pain Managment: Goal: General experience of comfort will improve Outcome: Progressing   Problem: Safety: Goal: Ability to remain free from injury will improve Outcome: Progressing   Problem: Skin Integrity: Goal: Risk for impaired skin integrity will decrease Outcome: Progressing

## 2021-03-05 ENCOUNTER — Other Ambulatory Visit: Payer: Self-pay | Admitting: Internal Medicine

## 2021-03-05 ENCOUNTER — Inpatient Hospital Stay: Payer: Medicaid Other | Attending: Neurosurgery

## 2021-03-05 LAB — CBC
HCT: 27.6 % — ABNORMAL LOW (ref 36.0–46.0)
Hemoglobin: 8.7 g/dL — ABNORMAL LOW (ref 12.0–15.0)
MCH: 27.8 pg (ref 26.0–34.0)
MCHC: 31.5 g/dL (ref 30.0–36.0)
MCV: 88.2 fL (ref 80.0–100.0)
Platelets: 183 10*3/uL (ref 150–400)
RBC: 3.13 MIL/uL — ABNORMAL LOW (ref 3.87–5.11)
RDW: 12 % (ref 11.5–15.5)
WBC: 5.4 10*3/uL (ref 4.0–10.5)
nRBC: 0 % (ref 0.0–0.2)

## 2021-03-05 LAB — GLUCOSE, CAPILLARY
Glucose-Capillary: 171 mg/dL — ABNORMAL HIGH (ref 70–99)
Glucose-Capillary: 131 mg/dL — ABNORMAL HIGH (ref 70–99)
Glucose-Capillary: 167 mg/dL — ABNORMAL HIGH (ref 70–99)

## 2021-03-05 LAB — CSF CULTURE W GRAM STAIN: Culture: NO GROWTH

## 2021-03-05 LAB — BASIC METABOLIC PANEL
Anion gap: 9 (ref 5–15)
BUN: 20 mg/dL (ref 6–20)
CO2: 25 mmol/L (ref 22–32)
Calcium: 8.9 mg/dL (ref 8.9–10.3)
Chloride: 104 mmol/L (ref 98–111)
Creatinine, Ser: 1.12 mg/dL — ABNORMAL HIGH (ref 0.44–1.00)
GFR, Estimated: >60 mL/min (ref >60)
Glucose, Bld: 147 mg/dL — ABNORMAL HIGH (ref 70–99)
Potassium: 4.2 mmol/L (ref 3.5–5.1)
Sodium: 138 mmol/L (ref 135–145)

## 2021-03-05 LAB — TOXOPLASMA GONDII, PCR: Toxoplasma Gondii, PCR: NEGATIVE

## 2021-03-05 LAB — METHYLMALONIC ACID, SERUM: Methylmalonic Acid, Quantitative: 458 nmol/L — ABNORMAL HIGH (ref 0–378)

## 2021-03-05 LAB — ASPERGILLUS ANTIGEN, BAL/SERUM: Aspergillus Ag, BAL/Serum: 0.04 Index (ref 0.00–0.49)

## 2021-03-05 MED ORDER — FLUCONAZOLE 200 MG PO TABS: 200.0000 mg | ORAL_TABLET | Freq: Every day | ORAL | 0 refills | Status: DC

## 2021-03-05 MED ORDER — GABAPENTIN 300 MG PO CAPS: 300.0000 mg | ORAL_CAPSULE | Freq: Every day | ORAL | 3 refills | Status: DC

## 2021-03-05 NOTE — TOC Transition Note (Signed)
Transition of Care [facility]) - CM/SW Discharge Note   Patient Details  Name: Jennifer [name] MRN: [id number] Date of Birth: [birth year]  Transition of Care [facility]) CM/SW Contact:  Pollie Friar, RN Phone Number: 03/05/2021, 2:16 PM   Clinical Narrative:    Patient is from home with family. She has walker/ 3 in 1/ wheelchair at home. Daughter requesting a tub bench for home. This was ordered through [facility] [facility] and will be delivered to the room.  Pt with recommendations for outpatient therapy. CM has talked with patients daughter over the phone and she is in agreement with [facility]. Orders in Epic and information on the AVS. Pt has needed supervision at home and daughter denies issues with home medications. Pt will have transport home when discharged.    Final next level of care: OP Rehab Barriers to Discharge: No Barriers Identified   Patient Goals and CMS Choice   CMS Medicare.gov Compare Post Acute Care list provided to:: Patient Represenative (must comment) Choice offered to / list presented to : Adult Children  Discharge Placement                       Discharge Plan and Services                DME Arranged: Tub bench DME Agency: [employer] Date DME Agency Contacted: 03/05/21   Representative spoke with at DME Agency: [place] ([place]) Interventions     Readmission Risk Interventions Readmission Risk Prevention Plan 04/12/2020  Transportation Screening Complete  PCP or Specialist Appt within 3-5 Days Complete  HRI or [place] Complete  Social Work Consult for [place] Planning/Counseling Complete  Palliative Care Screening Not Applicable  Medication Review Press photographer) Complete  Some recent data might be hidden

## 2021-03-05 NOTE — Progress Notes (Signed)
Occupational Therapy Treatment Patient Details Name: Jennifer [name] MRN: [id number] DOB: 05/03/1970 Today's Date: 03/05/2021   History of present illness 50 yo female presenting to ED on 11/6 with headache, nausea, and vomiting. MRI on 11/6 showing two adjacent enhancing lesions in the L occipital lobe. PMH including A fib, blindness of L eye Due to glaucoma and cataract, CKD stage 3, Closed trimalleolar fx of R ankle (05/02/2020), Clavicle fx (02/12/08), HTN, and DM.  Was scheduled for surgical biopsy/resection 11/10, but found to have ARF and supposed ataxia was asterixis.  Surgery on hold for now.   OT comments  Pt progressing towards established OT goals. Pt donning dress and underwear with Min Guard- Min A. Providing education on management of cath during LB ADLs as she will dc with cath. Pt performing short distance mobility in hallway with Min Guard A and RW. Per pt's functional status and insurance needs, update dc to home with follow up at OP therapy. Daughter confirms she can manage transportation. Answered all questions in preparation for dc later today.    Recommendations for follow up therapy are one component of a multi-disciplinary discharge planning process, led by the attending physician.  Recommendations may be updated based on patient status, additional functional criteria and insurance authorization.    Follow Up Recommendations  Outpatient OT    Assistance Recommended at Discharge Frequent or constant Supervision/Assistance  Equipment Recommendations  None recommended by OT    Recommendations for Other Services      Precautions / Restrictions Precautions Precautions: Fall Precaution Comments: interpreter, L eye blindness Restrictions Weight Bearing Restrictions: No       Mobility Bed Mobility Overal bed mobility: Needs Assistance Bed Mobility: Supine to Sit;Sit to Supine     Supine to sit: Min guard Sit to supine: Min guard   General bed mobility comments:  Min GUard A for safety and increased time    Transfers Overall transfer level: Needs assistance Equipment used: Rolling walker (2 wheels) Transfers: Sit to/from Stand Sit to Stand: Min guard           General transfer comment: Min GUard A for safety     Balance Overall balance assessment: Needs assistance Sitting-balance support: Feet supported Sitting balance-Leahy Scale: Good Sitting balance - Comments: static sitting EOB no LOB and uses 1-2 UE support during dynamic tasks/exercises   Standing balance support: Bilateral upper extremity supported Standing balance-Leahy Scale: Fair Standing balance comment: donning underwear. maintaining static stnading to pull up underwear over hips                           ADL either performed or assessed with clinical judgement   ADL Overall ADL's : Needs assistance/impaired                 Upper Body Dressing : Min guard;Sitting   Lower Body Dressing: Minimal assistance;Sit to/from stand Lower Body Dressing Details (indicate cue type and reason): Educating pt on management of cath while donning underwear. Min A for managing cath and then pt donning underwear. Threading cath, LLE, and then RLE.             Functional mobility during ADLs: Min guard;Rolling walker (2 wheels) General ADL Comments: Pt getting dressed in preparation for dc. Providing education on cath management during LB ADLs.    Extremity/Trunk Assessment Upper Extremity Assessment Upper Extremity Assessment: Generalized weakness   Lower Extremity Assessment Lower Extremity Assessment: Defer to PT evaluation  Vision   Vision Assessment?:  (L eye blindness)   Perception     Praxis      Cognition Arousal/Alertness: Awake/alert Behavior During Therapy: WFL for tasks assessed/performed;Impulsive Overall Cognitive Status: Impaired/Different from baseline Area of Impairment: Safety/judgement                          Safety/Judgement: Decreased awareness of deficits;Decreased awareness of safety     General Comments: Requiring increased time. fair safety awareness          Exercises Exercises: Other exercises Other Exercises Other Exercises: seated BLE AROM: hip flexion, LAQ, ankle pumps x15-20 reps ea Other Exercises: supine BLE AROM: heel slides, hip abduction x3-5 reps ea   Shoulder Instructions       General Comments son in room    Pertinent Vitals/ Pain       Pain Assessment: Faces Faces Pain Scale: Hurts a little bit Pain Location: foley placement area discomfort, feet "feel heavy" but no pain Pain Descriptors / Indicators: Discomfort Pain Intervention(s): Monitored during session;Limited activity within patient's tolerance;Repositioned  Home Living                                          Prior Functioning/Environment              Frequency  Min 2X/week        Progress Toward Goals  OT Goals(current goals can now be found in the care plan section)  Progress towards OT goals: Progressing toward goals  Acute Rehab OT Goals OT Goal Formulation: With patient/family Time For Goal Achievement: 03/12/21 Potential to Achieve Goals: Good ADL Goals Pt Will Perform Grooming: with supervision;standing Pt Will Perform Upper Body Dressing: with supervision;with set-up;sitting Pt Will Perform Lower Body Dressing: with set-up;with supervision;with adaptive equipment;sit to/from stand Pt Will Transfer to Toilet: with supervision;regular height toilet;ambulating Pt Will Perform Tub/Shower Transfer: Tub transfer;with min guard assist;ambulating;shower seat  Plan Discharge plan needs to be updated    Co-evaluation                 AM-PAC OT "6 Clicks" Daily Activity     Outcome Measure   Help from another person eating meals?: A Little Help from another person taking care of personal grooming?: A Little Help from another person toileting, which  includes using toliet, bedpan, or urinal?: A Little Help from another person bathing (including washing, rinsing, drying)?: A Little Help from another person to put on and taking off regular upper body clothing?: A Little Help from another person to put on and taking off regular lower body clothing?: A Little 6 Click Score: 18    End of Session Equipment Utilized During Treatment: Rolling walker (2 wheels);Gait belt  OT Visit Diagnosis: Unsteadiness on feet (R26.81);Repeated falls (R29.6)   Activity Tolerance Patient tolerated treatment well   Patient Left in bed;with call bell/phone within reach;with bed alarm set   Nurse Communication Mobility status        Time: 1344-1406 OT Time Calculation (min): 22 min  Charges: OT General Charges $OT Visit: 1 Visit OT Treatments $Self Care/Home Management : 8-22 mins  [place], OTR/L Acute Rehab Pager: [phone] Office: [place]  03/05/2021, 2:28 PM

## 2021-03-05 NOTE — Progress Notes (Deleted)
Discharge instructions (including medications) discussed with and copy provided to patient/caregiver 

## 2021-03-05 NOTE — Discharge Instructions (Addendum)
Jennifer [name]  You were recently hospitalized at [facility] for abdominal pain and a headache. We found some abnormalities on your brain picture that are concerning for stroke, though the pictures were not 100% clear. You are recommended to follow up with the neurologist in 1 months time to get another picture taken to confirm this diagnosis. Your kidneys also showed some damage during the hospital stay. We have adjusted some of your medications as a result.  Stop taking these medications: atorvastatin (lipitor), losartan (cozaar), metoclopramide (reglan), metoprolol ( lopressor), and midodrine (proamatine) 2. Change how you rake these medications: gabapentin- take once a day before bed rather than three times a day  3. Start taking: Fluconazole (diflucan) once a day for 12 days. Stop taking this when you run out of medication.  4. Urinary catheter- you have a follow up appointment scheduled with the bladder doctors to try and remove the catheter and let you pee on your own. This appointment is Friday at 10 AM with [facility].  Please follow up with [place] in the next week in clinic.   We recommend that you seek further care if you start experiencing new weakness, increasing dizziness, one sided facial droop, or difficulty speaking.

## 2021-03-05 NOTE — Plan of Care (Signed)
  Problem: Acute Rehab PT Goals(only PT should resolve) Goal: Pt Will Go Supine/Side To Sit Outcome: Adequate for Discharge Goal: Pt Will Go Sit To Supine/Side Outcome: Adequate for Discharge Goal: Patient Will Transfer Sit To/From Stand Outcome: Adequate for Discharge Goal: Pt Will Transfer Bed To Chair/Chair To Bed Outcome: Adequate for Discharge Goal: Pt Will Ambulate Outcome: Adequate for Discharge

## 2021-03-05 NOTE — Progress Notes (Addendum)
Physical Therapy Treatment Patient Details Name: Jennifer [name] MRN: [id number] DOB: Dec 30, 1970 Today's Date: 03/05/2021   History of Present Illness 50 yo female presenting to ED on 11/6 with headache, nausea, and vomiting. MRI on 11/6 showing two adjacent enhancing lesions in the L occipital lobe. PMH including A fib, blindness of L eye Due to glaucoma and cataract, CKD stage 3, Closed trimalleolar fx of R ankle (05/02/2020), Clavicle fx (02/12/08), HTN, and DM.  Was scheduled for surgical biopsy/resection 11/10, but found to have ARF and supposed ataxia was asterixis.  Surgery on hold for now.    PT Comments    Pt received in supine, agreeable to therapy session with translation via [place] interpreter [phone] on [employer] lang services. Pt with improved activity tolerance and able to progress gait distance with RW use and mostly minA, single loss of balance while turning needing up to modA. Discharge recommendation updated per pt progress and due to insurance type, discussed with supervising PT Kirke Corin. Pt reports she should have transport to OPPT appts from children, staff may need to confirm (family member present but sleeping under blanket). Pt making good progress toward goals, will plan to increase gait distance next session with chair follow and may initiate curb step training. Pt continues to benefit from PT services to progress toward functional mobility goals.   Recommendations for follow up therapy are one component of a multi-disciplinary discharge planning process, led by the attending physician.  Recommendations may be updated based on patient status, additional functional criteria and insurance authorization.  Follow Up Recommendations  Outpatient PT     Assistance Recommended at Discharge Frequent or constant Supervision/Assistance  Equipment Recommendations  None recommended by PT    Recommendations for Other Services       Precautions / Restrictions Precautions Precautions:  Fall Precaution Comments: interpreter, L eye blindness Restrictions Weight Bearing Restrictions: No     Mobility  Bed Mobility Overal bed mobility: Needs Assistance Bed Mobility: Supine to Sit;Sit to Supine     Supine to sit: Min guard Sit to supine: Min guard   General bed mobility comments: pt needing bed rail and min guard to reach upright posture, Supervision for seated scooting, increased time to perform.    Transfers Overall transfer level: Needs assistance Equipment used: Rolling walker (2 wheels) Transfers: Sit to/from Stand Sit to Stand: Supervision           General transfer comment: good hand placement when standing, min cues for proximity to bed prior to sitting and pt forgets to reach back prior sitting.    Ambulation/Gait Ambulation/Gait assistance: Min assist;Mod assist Gait Distance (Feet): 45 Feet Assistive device: Rolling walker (2 wheels) Gait Pattern/deviations: Step-to pattern;Step-through pattern;Decreased stride length;Decreased dorsiflexion - right;Decreased dorsiflexion - left;Trunk flexed;Decreased step length - left Gait velocity: slowed Gait velocity interpretation: <1.31 ft/sec, indicative of household ambulator   General Gait Details: pt with decreased B hip flexion and dorsiflexion bilaterally, pt reports BLE "feel heavy", min cues initially for RW proximity with fair carryover       Balance Overall balance assessment: Needs assistance Sitting-balance support: Feet supported Sitting balance-Leahy Scale: Fair Sitting balance - Comments: static sitting EOB no LOB and uses 1-2 UE support during dynamic tasks/exercises   Standing balance support: Bilateral upper extremity supported Standing balance-Leahy Scale: Poor Standing balance comment: UE support in standing and LOB while turning needing modA to correct, otherwise minA with RW  Cognition Arousal/Alertness: Awake/alert Behavior During  Therapy: WFL for tasks assessed/performed;Impulsive Overall Cognitive Status: Impaired/Different from baseline Area of Impairment: Safety/judgement          Safety/Judgement: Decreased awareness of deficits;Decreased awareness of safety     General Comments: [employer] language service video interpreter utilized, pt able to understand some questions via English as well, some slow processing but fair carryover of instructions given for RW use and fair safety awareness throughout.        Exercises Other Exercises Other Exercises: seated BLE AROM: hip flexion, LAQ, ankle pumps x15-20 reps ea Other Exercises: supine BLE AROM: heel slides, hip abduction x3-5 reps ea    General Comments General comments (skin integrity, edema, etc.): VSS on chart review and pt denies dizziness, only c/o BLE "heaviness" which per her is not new      Pertinent Vitals/Pain Pain Assessment: Faces Faces Pain Scale: Hurts a little bit Pain Location: foley placement area discomfort, feet "feel heavy" but no pain Pain Descriptors / Indicators: Discomfort Pain Intervention(s): Limited activity within patient's tolerance;Monitored during session;Repositioned     PT Goals (current goals can now be found in the care plan section) Acute Rehab PT Goals Patient Stated Goal: get stronger PT Goal Formulation: With patient/family Time For Goal Achievement: 03/12/21 Progress towards PT goals: Progressing toward goals    Frequency    Min 3X/week      PT Plan Discharge plan needs to be updated       AM-PAC PT "6 Clicks" Mobility   Outcome Measure  Help needed turning from your back to your side while in a flat bed without using bedrails?: A Little Help needed moving from lying on your back to sitting on the side of a flat bed without using bedrails?: A Little Help needed moving to and from a bed to a chair (including a wheelchair)?: A Little Help needed standing up from a chair using your arms (e.g., wheelchair  or bedside chair)?: A Little Help needed to walk in hospital room?: A Lot Help needed climbing 3-5 steps with a railing? : A Lot 6 Click Score: 16    End of Session Equipment Utilized During Treatment: Gait belt Activity Tolerance: Patient tolerated treatment well Patient left: in bed;with call bell/phone within reach;with bed alarm set Nurse Communication: Mobility status;Other (comment) (foley related discomfort) PT Visit Diagnosis: Unsteadiness on feet (R26.81);Other abnormalities of gait and mobility (R26.89);Muscle weakness (generalized) (M62.81);Difficulty in walking, not elsewhere classified (R26.2)     Time: 1203-1228 PT Time Calculation (min) (ACUTE ONLY): 25 min  Charges:  $Gait Training: 8-22 mins $Therapeutic Exercise: 8-22 mins                     Kyshaun Barnette P., PTA Acute Rehabilitation Services Pager: [phone] Office: [phone],   [place] 03/05/2021, 12:57 PM

## 2021-03-05 NOTE — Progress Notes (Cosign Needed)
HD#7 Subjective:  Overnight Events: NAEO   So thankful that she is feeling better. Says that when she came in she had a lot of belly pain and she was really sick but now she is doing better. The catheter is annoying but she understands that she needs it for now.  Objective:  Vital signs in last 24 hours: Vitals:   03/04/21 1744 03/04/21 2025 03/04/21 2350 03/05/21 0502  BP: (!) 171/62 (!) 180/81 (!) 150/77 (!) 147/68  Pulse: 75 77 73 74  Resp:  16 15 17   Temp: 98.5 F (36.9 C) 98.2 F (36.8 C) 98 F (36.7 C) 98.1 F (36.7 C)  TempSrc: Oral Oral Oral Oral  SpO2: 100% 99% 100% 99%  Weight:      Height:       Supplemental O2: Room Air SpO2: 99 %   Physical Exam:  Constitutional: chronically ill appearing woman resting comfortably in bed in NAD  Eyes: conjunctiva non-erythematous Cardiovascular: regular rate and rhythm, 1/6 systolic murmur, extra heart sound Pulmonary/Chest: normal work of breathing on room air, lungs CTAB Abdominal: soft, non-tender, non-distended, foley in place MSK: Ataxia almost completely resolved Neurological: alert, able to sit up in bed without assistance, Dysdiadochokinesia resolved, no dysmetria. Left arm weaker than right Skin: warm and dry, no LEE Psych: normal affect     Filed Weights   02/27/21 0045  Weight: 68.1 kg     Intake/Output Summary (Last 24 hours) at 03/05/2021 0735 Last data filed at 03/05/2021 0618 Gross per 24 hour  Intake 240 ml  Output 1475 ml  Net -1235 ml   Net IO Since Admission: -4,794.66 mL [03/05/21 0735]  Pertinent Labs: CBC Latest Ref Rng & Units 03/04/2021 03/03/2021 03/02/2021  WBC 4.0 - 10.5 K/uL 6.0 6.7 7.7  Hemoglobin 12.0 - 15.0 g/dL 8.9(L) 8.9(L) 9.1(L)  Hematocrit 36.0 - 46.0 % 28.3(L) 27.8(L) 28.3(L)  Platelets 150 - 400 K/uL 190 184 148(L)    CMP Latest Ref Rng & Units 03/04/2021 03/03/2021 03/02/2021  Glucose 70 - 99 mg/dL 205(H) 119(H) 122(H)  BUN 6 - 20 mg/dL 25(H) 37(H) 61(H)   Creatinine 0.44 - 1.00 mg/dL 1.29(H) 1.49(H) 3.09(H)  Sodium 135 - 145 mmol/L 137 137 136  Potassium 3.5 - 5.1 mmol/L 4.0 3.7 4.5  Chloride 98 - 111 mmol/L 103 104 103  CO2 22 - 32 mmol/L 25 26 26   Calcium 8.9 - 10.3 mg/dL 8.9 9.4 9.5  Total Protein 6.5 - 8.1 g/dL - - 5.7(L)  Total Bilirubin 0.3 - 1.2 mg/dL - - 0.3  Alkaline Phos 38 - 126 U/L - - 106  AST 15 - 41 U/L - - 41  ALT 0 - 44 U/L - - 32    Imaging: No results found.  Assessment/Plan:   Principal Problem:   Brain lesion Active Problems:   AKI (acute kidney injury) ([place])   Weight loss, non-intentional   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 ([place])   Physical deconditioning   Patient Summary: Jennifer [name] is a 50 y.o. 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   #Two enhancing lesions in the left occipital lobe with mild edema # Multifocal strokes on MRI  brain Complex situation with symptoms ultimately thought to be due to subacute stroke.  Malignancy still not completely ruled out without a biopsy of the lesion.  Patient has made significant improvement in the weakness and ataxia.  Plan for home health with PT and follow-up with neurology for reimaging in a few weeks.   --Home health with PT likely today --Repeat imaging in 4 weeks with neurology --Follow-up CSF labs --Patient will need hospital follow-up in [facility] clinic   #Acute renal Failure; multifactorial #Urinary retention AKI likely secondary to combination of urinary retention, contrast exposure and hypotensive episodes.  Kidney function continues to improve with Foley.  Creatinine down to 1.29 with a BUN of 25.  Making adequate urine  via Foley.  Endorsed some discomfort with the Foley in place but no signs of infection on UA.  Plan for discharge with Foley in place and 1 week follow-up with urology for voiding trial.   --Continue Foley --Avoid nephrotoxic agent --Monitor kidney function   #UTI Urine culture growing 80,000. Treating with Diflucan 200 mg for 2 weeks given patient is at increased risk of yeast infection and UTI due to female circumcision and Foley. -Continue Diflucan 200 mg daily (end 11/25)   #Poorly controlled insulin-dependent type 2 diabetes #Hyperglycemia #Diabetic neuropathy and diabetic retinopathy #Concern for Gastroparesis Patient has history of poorly controlled diabetes, last hemoglobin A1c 10/16 was 12.1% with sugars in the 300s on admission.  CBGs slightly improved to the 200s and 100s. --Restart metformin at discharge --Continue SSI with meals --Continue Semglee 15 units at night and NovoLog 5 units with meal --Continue Tradjenta 5 mg daily   #Soft BP; improving #Suspected Autonomic Dysfunction Suspect diabetic autonomic neuropathy causing hyper and hypotension. Patient has previously been prescribed midodrine for hypotension.   --orthostatic vitals yesterday WNL -- restart amlodipine 2.5 mg -- holding home losartan 25 and metoprolol 12.5   #Paroxysmal Atrial Fibrillation On Eliquis at home. Continues to be in normal sinus with HR in the 70s. Not currently in A. fib. Intracranial bleeding risk secondary to brain lesions suspected mets. --continue Eliquis 5 mg twice daily   #HFpEF: Chronic and stable #Nausea; resolved   Diet: Heart Healthy IVF: None,None VTE: Eliquis Code: Full PT/OT recs: Home health with PT  Scarlett Presto, MD Internal Medicine Resident PGY-1 Pager [phone] Please contact the on call pager after 5 pm and on weekends at [phone].

## 2021-03-06 ENCOUNTER — Other Ambulatory Visit: Payer: Self-pay

## 2021-03-06 ENCOUNTER — Encounter (HOSPITAL_COMMUNITY): Payer: Self-pay

## 2021-03-06 ENCOUNTER — Inpatient Hospital Stay (HOSPITAL_COMMUNITY)
Admission: EM | Admit: 2021-03-06 | Discharge: 2021-03-17 | DRG: 392 | Disposition: A | Discharge status: Rehabilitation Facility | Payer: Medicaid Other | Attending: Internal Medicine | Admitting: Internal Medicine

## 2021-03-06 ENCOUNTER — Emergency Department (HOSPITAL_COMMUNITY): Payer: Medicaid Other

## 2021-03-06 DIAGNOSIS — Z7984 Long term (current) use of oral hypoglycemic drugs: Secondary | ICD-10-CM | POA: Diagnosis not present

## 2021-03-06 DIAGNOSIS — D62 Acute posthemorrhagic anemia: Secondary | ICD-10-CM | POA: Diagnosis not present

## 2021-03-06 DIAGNOSIS — H409 Unspecified glaucoma: Secondary | ICD-10-CM | POA: Diagnosis present

## 2021-03-06 DIAGNOSIS — G939 Disorder of brain, unspecified: Secondary | ICD-10-CM | POA: Diagnosis not present

## 2021-03-06 DIAGNOSIS — K922 Gastrointestinal hemorrhage, unspecified: Secondary | ICD-10-CM | POA: Diagnosis present

## 2021-03-06 DIAGNOSIS — Z9189 Other specified personal risk factors, not elsewhere classified: Secondary | ICD-10-CM | POA: Diagnosis not present

## 2021-03-06 DIAGNOSIS — K6289 Other specified diseases of anus and rectum: Secondary | ICD-10-CM

## 2021-03-06 DIAGNOSIS — I152 Hypertension secondary to endocrine disorders: Secondary | ICD-10-CM | POA: Diagnosis present

## 2021-03-06 DIAGNOSIS — Z8616 Personal history of COVID-19: Secondary | ICD-10-CM | POA: Diagnosis not present

## 2021-03-06 DIAGNOSIS — R339 Retention of urine, unspecified: Secondary | ICD-10-CM | POA: Diagnosis present

## 2021-03-06 DIAGNOSIS — Z7901 Long term (current) use of anticoagulants: Secondary | ICD-10-CM | POA: Diagnosis not present

## 2021-03-06 DIAGNOSIS — E1122 Type 2 diabetes mellitus with diabetic chronic kidney disease: Secondary | ICD-10-CM | POA: Diagnosis present

## 2021-03-06 DIAGNOSIS — Z9114 Patient's other noncompliance with medication regimen: Secondary | ICD-10-CM

## 2021-03-06 DIAGNOSIS — B3749 Other urogenital candidiasis: Secondary | ICD-10-CM | POA: Diagnosis present

## 2021-03-06 DIAGNOSIS — K625 Hemorrhage of anus and rectum: Secondary | ICD-10-CM | POA: Diagnosis present

## 2021-03-06 DIAGNOSIS — N179 Acute kidney failure, unspecified: Secondary | ICD-10-CM | POA: Diagnosis not present

## 2021-03-06 DIAGNOSIS — E785 Hyperlipidemia, unspecified: Secondary | ICD-10-CM | POA: Diagnosis present

## 2021-03-06 DIAGNOSIS — N1831 Chronic kidney disease, stage 3a: Secondary | ICD-10-CM | POA: Diagnosis present

## 2021-03-06 DIAGNOSIS — R531 Weakness: Secondary | ICD-10-CM | POA: Diagnosis present

## 2021-03-06 DIAGNOSIS — E119 Type 2 diabetes mellitus without complications: Secondary | ICD-10-CM

## 2021-03-06 DIAGNOSIS — R7309 Other abnormal glucose: Secondary | ICD-10-CM | POA: Diagnosis not present

## 2021-03-06 DIAGNOSIS — Z8673 Personal history of transient ischemic attack (TIA), and cerebral infarction without residual deficits: Secondary | ICD-10-CM

## 2021-03-06 DIAGNOSIS — Z8249 Family history of ischemic heart disease and other diseases of the circulatory system: Secondary | ICD-10-CM | POA: Diagnosis not present

## 2021-03-06 DIAGNOSIS — K529 Noninfective gastroenteritis and colitis, unspecified: Principal | ICD-10-CM | POA: Diagnosis present

## 2021-03-06 DIAGNOSIS — H5440 Blindness, one eye, unspecified eye: Secondary | ICD-10-CM | POA: Diagnosis present

## 2021-03-06 DIAGNOSIS — R5381 Other malaise: Secondary | ICD-10-CM | POA: Diagnosis not present

## 2021-03-06 DIAGNOSIS — Z20822 Contact with and (suspected) exposure to covid-19: Secondary | ICD-10-CM | POA: Diagnosis present

## 2021-03-06 DIAGNOSIS — N189 Chronic kidney disease, unspecified: Secondary | ICD-10-CM | POA: Diagnosis not present

## 2021-03-06 DIAGNOSIS — H5462 Unqualified visual loss, left eye, normal vision right eye: Secondary | ICD-10-CM | POA: Diagnosis present

## 2021-03-06 DIAGNOSIS — Z833 Family history of diabetes mellitus: Secondary | ICD-10-CM

## 2021-03-06 DIAGNOSIS — E11319 Type 2 diabetes mellitus with unspecified diabetic retinopathy without macular edema: Secondary | ICD-10-CM | POA: Diagnosis not present

## 2021-03-06 DIAGNOSIS — E1169 Type 2 diabetes mellitus with other specified complication: Secondary | ICD-10-CM | POA: Diagnosis present

## 2021-03-06 DIAGNOSIS — R338 Other retention of urine: Secondary | ICD-10-CM | POA: Diagnosis not present

## 2021-03-06 DIAGNOSIS — Z794 Long term (current) use of insulin: Secondary | ICD-10-CM | POA: Diagnosis not present

## 2021-03-06 DIAGNOSIS — Z79899 Other long term (current) drug therapy: Secondary | ICD-10-CM | POA: Diagnosis not present

## 2021-03-06 DIAGNOSIS — I480 Paroxysmal atrial fibrillation: Secondary | ICD-10-CM | POA: Diagnosis present

## 2021-03-06 DIAGNOSIS — N183 Chronic kidney disease, stage 3 unspecified: Secondary | ICD-10-CM | POA: Diagnosis present

## 2021-03-06 DIAGNOSIS — I4891 Unspecified atrial fibrillation: Secondary | ICD-10-CM | POA: Diagnosis present

## 2021-03-06 DIAGNOSIS — E1165 Type 2 diabetes mellitus with hyperglycemia: Secondary | ICD-10-CM | POA: Diagnosis not present

## 2021-03-06 DIAGNOSIS — N1832 Chronic kidney disease, stage 3b: Secondary | ICD-10-CM | POA: Diagnosis not present

## 2021-03-06 HISTORY — DX: Other specified diseases of anus and rectum: K62.89

## 2021-03-06 HISTORY — DX: Gastrointestinal hemorrhage, unspecified: K92.2

## 2021-03-06 LAB — COMPREHENSIVE METABOLIC PANEL
ALT: 18 U/L (ref 0–44)
AST: 17 U/L (ref 15–41)
Albumin: 3.1 g/dL — ABNORMAL LOW (ref 3.5–5.0)
Alkaline Phosphatase: 107 U/L (ref 38–126)
Anion gap: 10 (ref 5–15)
BUN: 22 mg/dL — ABNORMAL HIGH (ref 6–20)
CO2: 24 mmol/L (ref 22–32)
Calcium: 9.4 mg/dL (ref 8.9–10.3)
Chloride: 106 mmol/L (ref 98–111)
Creatinine, Ser: 1.15 mg/dL — ABNORMAL HIGH (ref 0.44–1.00)
GFR, Estimated: 58 mL/min — ABNORMAL LOW (ref >60)
Glucose, Bld: 181 mg/dL — ABNORMAL HIGH (ref 70–99)
Potassium: 4.7 mmol/L (ref 3.5–5.1)
Sodium: 140 mmol/L (ref 135–145)
Total Bilirubin: 0.4 mg/dL (ref 0.3–1.2)
Total Protein: 7.5 g/dL (ref 6.5–8.1)

## 2021-03-06 LAB — APTT: aPTT: 35 seconds (ref 24–36)

## 2021-03-06 LAB — CBC WITH DIFFERENTIAL/PLATELET
Abs Immature Granulocytes: 0.02 10*3/uL (ref 0.00–0.07)
Basophils Absolute: 0 10*3/uL (ref 0.0–0.1)
Basophils Relative: 1 %
Eosinophils Absolute: 0.1 10*3/uL (ref 0.0–0.5)
Eosinophils Relative: 2 %
HCT: 32.6 % — ABNORMAL LOW (ref 36.0–46.0)
Hemoglobin: 10.2 g/dL — ABNORMAL LOW (ref 12.0–15.0)
Immature Granulocytes: 0 %
Lymphocytes Relative: 25 %
Lymphs Abs: 1.5 10*3/uL (ref 0.7–4.0)
MCH: 27.9 pg (ref 26.0–34.0)
MCHC: 31.3 g/dL (ref 30.0–36.0)
MCV: 89.1 fL (ref 80.0–100.0)
Monocytes Absolute: 0.4 10*3/uL (ref 0.1–1.0)
Monocytes Relative: 6 %
Neutro Abs: 4.1 10*3/uL (ref 1.7–7.7)
Neutrophils Relative %: 66 %
Platelets: 253 10*3/uL (ref 150–400)
RBC: 3.66 MIL/uL — ABNORMAL LOW (ref 3.87–5.11)
RDW: 12.1 % (ref 11.5–15.5)
WBC: 6.1 10*3/uL (ref 4.0–10.5)
nRBC: 0 % (ref 0.0–0.2)

## 2021-03-06 LAB — JC VIRUS, PCR CSF: JC Virus PCR, CSF: NEGATIVE

## 2021-03-06 LAB — CBC
HCT: 31.4 % — ABNORMAL LOW (ref 36.0–46.0)
Hemoglobin: 9 g/dL — ABNORMAL LOW (ref 12.0–15.0)
MCH: 27.2 pg (ref 26.0–34.0)
MCHC: 28.7 g/dL — ABNORMAL LOW (ref 30.0–36.0)
MCV: 94.9 fL (ref 80.0–100.0)
Platelets: 257 10*3/uL (ref 150–400)
RBC: 3.31 MIL/uL — ABNORMAL LOW (ref 3.87–5.11)
RDW: 12.1 % (ref 11.5–15.5)
WBC: 5.3 10*3/uL (ref 4.0–10.5)
nRBC: 0 % (ref 0.0–0.2)

## 2021-03-06 LAB — RESP PANEL BY RT-PCR (FLU A&B, COVID) ARPGX2
Influenza A by PCR: NEGATIVE
Influenza B by PCR: NEGATIVE
SARS Coronavirus 2 by RT PCR: NEGATIVE

## 2021-03-06 LAB — GLUCOSE, CAPILLARY: Glucose-Capillary: 127 mg/dL — ABNORMAL HIGH (ref 70–99)

## 2021-03-06 LAB — PROTIME-INR
INR: 1.1 (ref 0.8–1.2)
Prothrombin Time: 14.6 seconds (ref 11.4–15.2)

## 2021-03-06 LAB — ABO/RH: ABO/RH(D): O POS

## 2021-03-06 LAB — CBG MONITORING, ED: Glucose-Capillary: 122 mg/dL — ABNORMAL HIGH (ref 70–99)

## 2021-03-06 LAB — CYTOLOGY - NON PAP

## 2021-03-06 LAB — POC OCCULT BLOOD, ED: Fecal Occult Bld: POSITIVE — AB

## 2021-03-06 MED ORDER — SODIUM CHLORIDE 0.9% FLUSH
3.0000 mL | Freq: Two times a day (BID) | INTRAVENOUS | Status: DC
  Administered 2021-03-08 – 2021-03-17 (×10): 3 mL via INTRAVENOUS

## 2021-03-06 MED ORDER — METRONIDAZOLE 500 MG/100ML IV SOLN
500.0000 mg | Freq: Once | INTRAVENOUS | Status: AC
  Administered 2021-03-06: 500 mg via INTRAVENOUS
  Filled 2021-03-06: qty 100

## 2021-03-06 MED ORDER — INSULIN ASPART 100 UNIT/ML IJ SOLN
0.0000 [IU] | Freq: Every day | INTRAMUSCULAR | Status: DC
Start: 2021-03-06 — End: 2021-03-17
  Administered 2021-03-07 – 2021-03-09 (×2): 2 [IU] via SUBCUTANEOUS
  Administered 2021-03-12 – 2021-03-14 (×2): 3 [IU] via SUBCUTANEOUS
  Filled 2021-03-06: qty 0.05

## 2021-03-06 MED ORDER — FLUCONAZOLE 100 MG PO TABS
200.0000 mg | ORAL_TABLET | Freq: Every day | ORAL | Status: DC
  Administered 2021-03-06 – 2021-03-12 (×7): 200 mg via ORAL
  Filled 2021-03-06: qty 1
  Filled 2021-03-06 (×6): qty 2

## 2021-03-06 MED ORDER — HYDRALAZINE HCL 20 MG/ML IJ SOLN: 5.0000 mg | INTRAMUSCULAR | Status: DC | PRN

## 2021-03-06 MED ORDER — SODIUM CHLORIDE 0.9 % IV SOLN
2.0000 g | INTRAVENOUS | Status: DC
  Administered 2021-03-07 – 2021-03-09 (×3): 2 g via INTRAVENOUS
  Filled 2021-03-06 (×4): qty 20

## 2021-03-06 MED ORDER — SODIUM CHLORIDE 0.9 % IV SOLN
1.0000 g | Freq: Once | INTRAVENOUS | Status: AC
  Administered 2021-03-06: 1 g via INTRAVENOUS
  Filled 2021-03-06: qty 10

## 2021-03-06 MED ORDER — INSULIN DETEMIR 100 UNIT/ML ~~LOC~~ SOLN
15.0000 [IU] | Freq: Every day | SUBCUTANEOUS | Status: DC
  Administered 2021-03-06 – 2021-03-12 (×7): 15 [IU] via SUBCUTANEOUS
  Filled 2021-03-06 (×9): qty 0.15

## 2021-03-06 MED ORDER — AMLODIPINE BESYLATE 5 MG PO TABS
2.5000 mg | ORAL_TABLET | Freq: Two times a day (BID) | ORAL | Status: DC
  Administered 2021-03-06 – 2021-03-17 (×23): 2.5 mg via ORAL
  Filled 2021-03-06 (×23): qty 1

## 2021-03-06 MED ORDER — BRIMONIDINE TARTRATE 0.2 % OP SOLN
1.0000 [drp] | Freq: Three times a day (TID) | OPHTHALMIC | Status: DC
  Administered 2021-03-06 – 2021-03-17 (×32): 1 [drp] via OPHTHALMIC
  Filled 2021-03-06: qty 5

## 2021-03-06 MED ORDER — CHLORHEXIDINE GLUCONATE CLOTH 2 % EX PADS
6.0000 | MEDICATED_PAD | Freq: Every day | CUTANEOUS | Status: DC
  Administered 2021-03-06 – 2021-03-09 (×4): 6 via TOPICAL

## 2021-03-06 MED ORDER — METRONIDAZOLE 500 MG/100ML IV SOLN
500.0000 mg | Freq: Two times a day (BID) | INTRAVENOUS | Status: DC
  Administered 2021-03-06 – 2021-03-09 (×7): 500 mg via INTRAVENOUS
  Filled 2021-03-06 (×8): qty 100

## 2021-03-06 MED ORDER — ONDANSETRON HCL 4 MG/2ML IJ SOLN: 4.0000 mg | Freq: Four times a day (QID) | INTRAMUSCULAR | Status: DC | PRN

## 2021-03-06 MED ORDER — MORPHINE SULFATE (PF) 2 MG/ML IV SOLN: 2.0000 mg | INTRAVENOUS | Status: DC | PRN

## 2021-03-06 MED ORDER — ACETAMINOPHEN 650 MG RE SUPP: 650.0000 mg | Freq: Four times a day (QID) | RECTAL | Status: DC | PRN

## 2021-03-06 MED ORDER — ACETAMINOPHEN 325 MG PO TABS
650.0000 mg | ORAL_TABLET | Freq: Four times a day (QID) | ORAL | Status: DC | PRN
  Administered 2021-03-10 – 2021-03-17 (×2): 650 mg via ORAL
  Filled 2021-03-06 (×2): qty 2

## 2021-03-06 MED ORDER — GABAPENTIN 300 MG PO CAPS
300.0000 mg | ORAL_CAPSULE | Freq: Every day | ORAL | Status: DC
  Administered 2021-03-06 – 2021-03-16 (×11): 300 mg via ORAL
  Filled 2021-03-06 (×11): qty 1

## 2021-03-06 MED ORDER — DORZOLAMIDE HCL-TIMOLOL MAL 2-0.5 % OP SOLN
1.0000 [drp] | Freq: Two times a day (BID) | OPHTHALMIC | Status: DC
  Administered 2021-03-06 – 2021-03-17 (×22): 1 [drp] via OPHTHALMIC
  Filled 2021-03-06: qty 10

## 2021-03-06 MED ORDER — ONDANSETRON HCL 4 MG PO TABS: 4.0000 mg | ORAL_TABLET | Freq: Four times a day (QID) | ORAL | Status: DC | PRN

## 2021-03-06 MED ORDER — INSULIN ASPART 100 UNIT/ML IJ SOLN
0.0000 [IU] | Freq: Three times a day (TID) | INTRAMUSCULAR | Status: DC
  Administered 2021-03-06: 2 [IU] via SUBCUTANEOUS
  Administered 2021-03-07 – 2021-03-10 (×6): 3 [IU] via SUBCUTANEOUS
  Administered 2021-03-10 (×2): 2 [IU] via SUBCUTANEOUS
  Administered 2021-03-11: 5 [IU] via SUBCUTANEOUS
  Administered 2021-03-11 – 2021-03-12 (×3): 3 [IU] via SUBCUTANEOUS
  Administered 2021-03-12: 5 [IU] via SUBCUTANEOUS
  Administered 2021-03-13: 2 [IU] via SUBCUTANEOUS
  Administered 2021-03-13 (×2): 5 [IU] via SUBCUTANEOUS
  Administered 2021-03-14: 8 [IU] via SUBCUTANEOUS
  Administered 2021-03-14: 3 [IU] via SUBCUTANEOUS
  Administered 2021-03-15: 2 [IU] via SUBCUTANEOUS
  Administered 2021-03-15: 8 [IU] via SUBCUTANEOUS
  Administered 2021-03-15: 5 [IU] via SUBCUTANEOUS
  Administered 2021-03-16: 3 [IU] via SUBCUTANEOUS
  Administered 2021-03-16: 2 [IU] via SUBCUTANEOUS
  Administered 2021-03-17: 3 [IU] via SUBCUTANEOUS
  Filled 2021-03-06: qty 0.15

## 2021-03-06 MED ORDER — LACTATED RINGERS IV SOLN: INTRAVENOUS | Status: DC

## 2021-03-06 MED ORDER — IOHEXOL 350 MG/ML SOLN
80.0000 mL | Freq: Once | INTRAVENOUS | Status: AC | PRN
  Administered 2021-03-06: 80 mL via INTRAVENOUS

## 2021-03-06 NOTE — ED Triage Notes (Addendum)
Pt BIB EMS from home with reports of rectal bleeding that started 2 hrs ago. Pt states that she has a hx of stomach ulcers and is currently on a blood thinner. Pt describes the bleeding as clots. Pt speaks Arabic, daughter at bedside.   82 hr 18 r 98% room air  207 cbg

## 2021-03-06 NOTE — ED Notes (Signed)
Pt's son at bedside

## 2021-03-06 NOTE — ED Provider Notes (Signed)
Accepted handoff at shift change from [facility]. Please see prior provider note for more detail. Her HPI below:   50 year old female with complex medical history including diabetes, hyperlipidemia, Afib (on chronic Eliquis), and HTN presents to the ED for concern of blood in her bowel movements.  She reports having 4 bowel movements over the course of an hour beginning at 0230.  She believes that there was some blood in her last bowel movement; first 3 episodes were diarrhea.  She has had some associated discomfort in her lower abdomen since onset of her bowel changes.  Does endorse some nausea as well.  She has not taken any medications prior to arrival for her symptoms.  No fevers, vomiting, melena, hematuria.  Has a foley catheter in place currently which is due to be removed on Friday.  This was placed during recent admission for management of acute urinary retention and AKI.  Briefly: Patient is 50 y.o.   DDX: concern for ongoing microscopic GI bleed on blood thinner. CT abd/pelvis pending.   Plan: admit for ongoing GI bleed for serial CBC. Patient CT abd/pelvis shows concern for proctitis / colitis. No hx IBD, no hx receptive anal sex. Will treat presumptively as intra-abdominal infection. Patient informed. Hospitalist consulted and agrees to admission at this time.     Anselmo Pickler, PA-C 03/06/21 1053    Varney Biles, MD 03/07/21 [phone]

## 2021-03-06 NOTE — ED Provider Notes (Signed)
House DEPT Provider Note   CSN: [id number] Arrival date & time: 03/06/21  0417     History Chief Complaint  Patient presents with  . Rectal Bleeding    Jennifer [name] is a 50 y.o. female.  50 year old female with complex medical history including diabetes, hyperlipidemia, Afib (on chronic Eliquis), and HTN presents to the ED for concern of blood in her bowel movements.  She reports having 4 bowel movements over the course of an hour beginning at 0230.  She believes that there was some blood in her last bowel movement; first 3 episodes were diarrhea.  She has had some associated discomfort in her lower abdomen since onset of her bowel changes.  Does endorse some nausea as well.  She has not taken any medications prior to arrival for her symptoms.  No fevers, vomiting, melena, hematuria.  Has a foley catheter in place currently which is due to be removed on Friday.  This was placed during recent admission for management of acute urinary retention and AKI.  The history is provided by the patient. No language interpreter was used.  Rectal Bleeding     Past Medical History:  Diagnosis Date  . Anemia, iron deficiency   . Atrial fibrillation ([place])   . Atrial fibrillation with RVR ([place]) 02/03/2021  . Blindness of left eye   . Closed trimalleolar fracture of right ankle 05/02/2020   Added automatically from request for surgery [phone]  . COVID-19 virus infection 06/15/2020  . Decreased visual acuity    Left eye  . Depression   . 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 Hair loss   . History of fracture of clavicle 05/18/2015  . Hyperlipidemia   . Hypertension   . Hypertension associated with diabetes ([place]) 07/23/2016  . Iron deficiency anemia 05/13/2013  . Left anterior shoulder pain 07/06/2017  . Pain, dental 08/19/2018    Tooth pain/facial swelling: has poor dentition at baseline, history of dental abscess.  She has not seen a dentist in about one year.  Dentist is on [address].  No fevers chills or systemic symptoms.  Diabetes has been poorly controlled for some time.  She said it has improved recently averaging around 140.  Called the dentist said they would not see patients until May 14th but the urgency o  . Pap smear abnormality of cervix with LGSIL   . Routine/ritual circumcision   . Type II diabetes mellitus ([place])   . Uveitis     Patient Active Problem List   Diagnosis Date Noted  . Brain lesion 02/25/2021  . NSTEMI (non-ST elevated myocardial infarction) ([place]) 02/04/2021  . Hypertensive urgency 02/03/2021  . Injury of right toe, initial encounter 01/05/2021  . Physical deconditioning 01/05/2021  . 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 Orthostatic hypotension 11/22/2020  . Lower extremity weakness 11/22/2020  . CKD (chronic kidney disease) stage 3, GFR 30-59 ml/min ([facility]) 11/22/2020  . Depression 08/22/2020  . Weight loss, non-intentional 07/04/2020  . Adult failure to thrive 07/04/2020  . Early satiety 05/31/2020  . Urinary tract infection with hematuria 05/16/2020  . AKI (acute kidney injury) ([place]) 03/14/2020  . Bilateral lower extremity edema 06/07/2019  . Atrial fibrillation ([place]) 09/20/2018  . Migraine   . (HFpEF) heart failure with preserved ejection fraction ([place]) 01/01/2018  . 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  .  Pseudophakia of right eye 09/16/2017  . 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 Fatigue 04/02/2016  . 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 Vitamin D deficiency 10/30/2014  . Diabetic neuropathy ([place]) 05/13/2013  . Healthcare maintenance 02/26/2011  . Hyperlipidemia 05/05/2006  . Type II diabetes mellitus ([place]) 04/22/1998    Past Surgical History:  Procedure Laterality Date  . CESAREAN SECTION     x 1  .  EYE SURGERY Bilateral    Lasik  . LEFT HEART CATH AND CORONARY ANGIOGRAPHY N/A 02/05/2021   Procedure: LEFT HEART CATH AND CORONARY ANGIOGRAPHY;  Surgeon: Dixie Dials, MD;  Location: [place] [facility];  Service: Cardiovascular;  Laterality: N/A;  . MULTIPLE TOOTH EXTRACTIONS     bottom denture  . ORIF ANKLE FRACTURE Right 05/05/2020   Procedure: OPEN REDUCTION INTERNAL FIXATION (ORIF) ANKLE FRACTURE;  Surgeon: Shona Needles, MD;  Location: [facility];  Service: Orthopedics;  Laterality: Right;     OB History     Gravida  7   Para  4   Term  4   Preterm  0   AB  3   Living         SAB  3   IAB  0   Ectopic  0   Multiple      Live Births              Family History  Problem Relation Age of Onset  . Diabetes Father   . Hypertension Other     Social History   Tobacco Use  . Smoking status: Never  . Smokeless tobacco: Never  Vaping Use  . Vaping Use: Never used  Substance Use Topics  . Alcohol use: No  . Drug use: No    Home Medications Prior to Admission medications   Medication Sig Start Date End Date Taking? Authorizing Provider  Accu-Chek FastClix Lancets MISC Check blood sugar 4 times a day 06/07/19   Jose Persia, MD  ACCU-CHEK GUIDE test strip CHECK BLOOD SUGAR 4 TIMES PER DAY 06/23/20   Mosetta Anis, MD  amLODipine (NORVASC) 2.5 MG tablet Take 1 tablet (2.5 mg total) by mouth 2 (two) times daily. 02/07/21   Jose Persia, MD  apixaban (ELIQUIS) 5 MG TABS tablet Take 1 tablet (5 mg total) by mouth 2 (two) times daily. 01/05/21   Mitzi Hansen, MD  Blood Glucose Monitoring Suppl (ACCU-CHEK GUIDE) w/Device KIT 1 each by Does not apply route 4 (four) times daily. 11/19/18   Jose Persia, MD  brimonidine (ALPHAGAN) 0.2 % ophthalmic solution Place 1 drop into the left eye every 8 (eight) hours. 08/01/20   [provider]  calcium citrate-vitamin D (CITRACAL+D) 315-200 MG-UNIT tablet Take 2 tablets by mouth 2 (two) times daily. 01/05/21  01/05/22  Mitzi Hansen, MD  diclofenac Sodium (VOLTAREN) 1 % GEL Apply 2 g topically 4 (four) times daily as needed (foot pain).    [provider]  dorzolamide-timolol (COSOPT) 22.3-6.8 MG/ML ophthalmic solution Place 1 drop into the left eye 2 (two) times daily. 10/16/20   [provider]  feeding supplement (ENSURE ENLIVE / ENSURE PLUS) LIQD Take 237 mLs by mouth 2 (two) times daily between meals. 02/06/21   Orvis Brill, MD  fluconazole (DIFLUCAN) 200 MG tablet Take 1 tablet (200 mg total) by mouth daily for 12 days. 03/06/21 03/18/21  Lacinda Axon, MD  gabapentin (NEURONTIN) 300 MG capsule Take 1 capsule (300 mg total)  by mouth at bedtime. 03/05/21 03/05/22  Lacinda Axon, MD  insulin aspart (NOVOLOG) 100 UNIT/ML FlexPen Inject 3 Units into the skin 3 (three) times daily with meals. 02/09/21 03/11/21  Virl Axe, MD  insulin detemir (LEVEMIR) 100 UNIT/ML FlexPen Inject 15 Units into the skin at bedtime 02/09/21 03/11/21  Virl Axe, MD  Insulin Pen Needle (B-D UF III MINI PEN NEEDLES) 31G X 5 MM MISC Use pen needle daily for injections 06/23/20   Mosetta Anis, MD  Insulin Pen Needle (PENTIPS) 32G X 4 MM MISC Use as directed with insulin pen 11/29/20   Harvie Heck, MD  linagliptin (TRADJENTA) 5 MG TABS tablet Take 1 tablet (5 mg total) by mouth daily. 02/09/21 03/11/21  Virl Axe, MD  metFORMIN (GLUCOPHAGE-XR) 500 MG 24 hr tablet TAKE 2 TABLETS (1,000 MG TOTAL) BY MOUTH IN THE MORNING AND AT BEDTIME. 01/23/21 02/22/21  Jose Persia, MD  Continuous Blood Gluc Sensor (DEXCOM G6 SENSOR) MISC Use to check blood sugar at least 6 times a day Patient not taking: Reported on 08/29/2020 08/09/20 09/27/20  Harvie Heck, MD  Continuous Blood Gluc Transmit (DEXCOM G6 TRANSMITTER) MISC Use to check blood sugar at least 6 times a day Patient not taking: Reported on 08/29/2020 08/09/20 09/27/20  Harvie Heck, MD    Allergies    Patient has no known  allergies.  Review of Systems   Review of Systems  Gastrointestinal:  Positive for hematochezia.  Ten systems reviewed and are negative for acute change, except as noted in the HPI.    Physical Exam Updated Vital Signs BP (!) 142/71   Pulse 80   Temp 98 F (36.7 C) (Oral)   Resp 15   LMP 08/21/2016 (Exact Date)   SpO2 100%   Physical Exam Vitals and nursing note reviewed.  Constitutional:      General: She is not in acute distress.    Appearance: She is well-developed. She is not diaphoretic.     Comments: Chronically ill appearing, but nontoxic.  HENT:     Head: Normocephalic and atraumatic.  Eyes:     General: No scleral icterus.    Conjunctiva/sclera: Conjunctivae normal.     Comments: Extensive corneal defect of L eye c/w history of blindness.  Pulmonary:     Effort: Pulmonary effort is normal. No respiratory distress.     Comments: Respirations even and unlabored Abdominal:     Palpations: Abdomen is soft. There is no mass.     Tenderness: There is abdominal tenderness (LLQ). There is guarding (minimal, voluntary). There is no rebound.  Genitourinary:    Comments: Exam chaperoned by RN. No gross blood on DRE. No melena appreciated. No appreciable hemorrhoid or fissure. Musculoskeletal:        General: Normal range of motion.     Cervical back: Normal range of motion.  Skin:    General: Skin is warm and dry.     Coloration: Skin is not pale.     Findings: No erythema or rash.  Neurological:     Mental Status: She is alert and oriented to person, place, and time.     Coordination: Coordination normal.  Psychiatric:        Behavior: Behavior normal.    ED Results / Procedures / Treatments   Labs (all labs ordered are listed, but only abnormal results are displayed) Labs Reviewed  CBC WITH DIFFERENTIAL/PLATELET - Abnormal; Notable for the following components:      Result Value   RBC  3.66 (*)    Hemoglobin 10.2 (*)    HCT 32.6 (*)    All other components  within normal limits  COMPREHENSIVE METABOLIC PANEL - Abnormal; Notable for the following components:   Glucose, Bld 181 (*)    BUN 22 (*)    Creatinine, Ser 1.15 (*)    Albumin 3.1 (*)    GFR, Estimated 58 (*)    All other components within normal limits  POC OCCULT BLOOD, ED - Abnormal; Notable for the following components:   Fecal Occult Bld POSITIVE (*)    All other components within normal limits  PROTIME-INR  APTT  ABO/RH    EKG None  Radiology No results found.  Procedures Procedures   Medications Ordered in ED Medications - No data to display   ED Course  I have reviewed the triage vital signs and the nursing notes.  Pertinent labs & imaging results that were available during my care of the patient were reviewed by me and considered in my medical decision making (see chart for details).  Clinical Course as of 03/06/21 0724  Tue Mar 06, 2021  0709 Hx afib, dm -- just dc after admission, ct showed areas of hypodensity in brain concerning for metastases, developed AMS during bx, acute renal failure, neuro involved, no potential source for additional primary cancer  Foley in place for retention, d/c Friday 4 diarrhea c blood, no more blood since then, but hemoccult positive  Repeat imaging? Otherwise call hospitalist for GI bleed admit for CBC trending  [CP]    Clinical Course User Index [CP] Prosperi, Jackalyn [place]   MDM Rules/Calculators/A&P                           50 year old female presents to the emergency department for evaluation of rectal bleeding.  She had some diarrhea at home with her last bowel movement containing bright red blood.  She is anticoagulated on Eliquis given her history of atrial fibrillation.  CBC today is stable compared to most recent admission.  She does have focal tenderness in the left lower quadrant on abdominal exam.  Pending CT for further evaluation, though would benefit from admission for trending of her CBC and  observation to ensure no continued bleeding.  Care signed out to Prosperi, PA-C at change of shift.   Final Clinical Impression(s) / ED Diagnoses Final diagnoses:  Rectal bleeding    Rx / DC Orders ED Discharge Orders     None        Antonietta Breach, PA-C 31/51/76 1607    Delora Fuel, MD 37/10/62 [phone]

## 2021-03-06 NOTE — ED Notes (Signed)
ED TO INPATIENT HANDOFF REPORT  ED Nurse Name and Phone #: [phone] Erick Colace, RN   S Name/Age/Gender [name] 50 y.o. female Room/Bed: WA18/WA18  Code Status   Code Status: Full Code  Home/SNF/Other Home Patient oriented to: self, place, time, and situation Is this baseline? Yes   Triage Complete: Triage complete  Chief Complaint Acute lower GI bleeding [K92.2]  Triage Note Pt BIB EMS from home with reports of rectal bleeding that started 2 hrs ago. Pt states that she has a hx of stomach ulcers and is currently on a blood thinner. Pt describes the bleeding as clots. Pt speaks Arabic, daughter at bedside.   82 hr 18 r 98% room air  207 cbg    Allergies No Known Allergies  Level of Care/Admitting Diagnosis ED Disposition     ED Disposition  Admit   Condition  --   Comment  Hospital Area: [facility] [[id number]] Level of Care: Telemetry [5] Admit to tele based on following criteria: Other see comments Comments: afib + GI bleed May admit patient to [facility] or [facility] if equivalent level of care  is available:: Yes Covid Evaluation: covid negative Diagnosis: Acute lower GI bleeding [151761] Admitting Physician: Karmen Bongo [[id number]] Attending Physician: Karmen Bongo [[id number]] Estimated length of stay: past midnight tomorrow Certificat ion:: I certify this patient will need inpatient services for at least 2 midnights          B Medical/Surgery History Past Medical History:  Diagnosis Date   Anemia, iron deficiency    Atrial fibrillation ([place])    Blindness of left eye    Closed trimalleolar fracture of right ankle 05/02/2020   Added automatically from request for surgery [id number]   COVID-19 virus infection 06/15/2020   Depression    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   Annotation: secondary to uveitis of unknown etiology Qualifier: Diagnosis of  By: Hilma Favors  DO, Beth     Hair loss     History of fracture of clavicle 05/18/2015   Hyperlipidemia    Hypertension    Hypertension associated with diabetes ([place]) 07/23/2016   Iron deficiency anemia 05/13/2013   Left anterior shoulder pain 07/06/2017   Pap smear abnormality of cervix with LGSIL    Routine/ritual circumcision    Type II diabetes mellitus ([place])    Uveitis    Past Surgical History:  Procedure Laterality Date   CESAREAN SECTION     x 1   EYE SURGERY Bilateral    Lasik   LEFT HEART CATH AND CORONARY ANGIOGRAPHY N/A 02/05/2021   Procedure: LEFT HEART CATH AND CORONARY ANGIOGRAPHY;  Surgeon: Dixie Dials, MD;  Location: [facility];  Service: Cardiovascular;  Laterality: N/A;   MULTIPLE TOOTH EXTRACTIONS     bottom denture   ORIF ANKLE FRACTURE Right 05/05/2020   Procedure: OPEN REDUCTION INTERNAL FIXATION (ORIF) ANKLE FRACTURE;  Surgeon: Shona Needles, MD;  Location: [facility];  Service: Orthopedics;  Laterality: Right;     A IV Location/Drains/Wounds Patient Lines/Drains/Airways Status     Active Line/Drains/Airways     Name Placement date Placement time Site Days   Peripheral IV 03/06/21 22 G 1" Right;Anterior Forearm 03/06/21  0540  Forearm  less than 1   Urethral Catheter Urologist Coude 16 Fr. 03/01/21  0800  Coude  5   Incision (Closed) 05/05/20 Ankle Right 05/05/20  1051  -- 305            Intake/Output Last  24 hours No intake or output data in the 24 hours ending 03/06/21 2001  Labs/Imaging Results for orders placed or performed during the hospital encounter of 03/06/21 (from the past 48 hour(s))  CBC with Differential     Status: Abnormal   Collection Time: 03/06/21  6:01 AM  Result Value Ref Range   WBC 6.1 4.0 - 10.5 K/uL   RBC 3.66 (L) 3.87 - 5.11 MIL/uL   Hemoglobin 10.2 (L) 12.0 - 15.0 g/dL   HCT 32.6 (L) 36.0 - 46.0 %   MCV 89.1 80.0 - 100.0 fL   MCH 27.9 26.0 - 34.0 pg   MCHC 31.3 30.0 - 36.0 g/dL   RDW 12.1 11.5 - 15.5 %   Platelets 253 150 - 400 K/uL   nRBC 0.0  0.0 - 0.2 %   Neutrophils Relative % 66 %   Neutro Abs 4.1 1.7 - 7.7 K/uL   Lymphocytes Relative 25 %   Lymphs Abs 1.5 0.7 - 4.0 K/uL   Monocytes Relative 6 %   Monocytes Absolute 0.4 0.1 - 1.0 K/uL   Eosinophils Relative 2 %   Eosinophils Absolute 0.1 0.0 - 0.5 K/uL   Basophils Relative 1 %   Basophils Absolute 0.0 0.0 - 0.1 K/uL   Immature Granulocytes 0 %   Abs Immature Granulocytes 0.02 0.00 - 0.07 K/uL    Comment: Performed at [facility], [place] [address], [place], [place] [address]  Comprehensive metabolic panel     Status: Abnormal   Collection Time: 03/06/21  6:01 AM  Result Value Ref Range   Sodium 140 135 - 145 mmol/L   Potassium 4.7 3.5 - 5.1 mmol/L   Chloride 106 98 - 111 mmol/L   CO2 24 22 - 32 mmol/L   Glucose, Bld 181 (H) 70 - 99 mg/dL    Comment: Glucose reference range applies only to samples taken after fasting for at least 8 hours.   BUN 22 (H) 6 - 20 mg/dL   Creatinine, Ser 1.15 (H) 0.44 - 1.00 mg/dL   Calcium 9.4 8.9 - 10.3 mg/dL   Total Protein 7.5 6.5 - 8.1 g/dL   Albumin 3.1 (L) 3.5 - 5.0 g/dL   AST 17 15 - 41 U/L   ALT 18 0 - 44 U/L   Alkaline Phosphatase 107 38 - 126 U/L   Total Bilirubin 0.4 0.3 - 1.2 mg/dL   GFR, Estimated 58 (L) >60 mL/min    Comment: (NOTE) Calculated using the CKD-EPI Creatinine Equation (2021)    Anion gap 10 5 - 15    Comment: Performed at [facility], [place] [address], [place], [place] [address]  ABO/Rh     Status: None   Collection Time: 03/06/21  6:01 AM  Result Value Ref Range   ABO/RH(D)      O POS Performed at [facility], [place] [address] [place], [place] [address]   Protime-INR     Status: None   Collection Time: 03/06/21  6:01 AM  Result Value Ref Range   Prothrombin Time 14.6 11.4 - 15.2 seconds   INR 1.1 0.8 - 1.2    Comment: (NOTE) INR goal varies based on device and disease states. Performed at [facility], [place] [address], [place], [place] [address]   APTT     Status: None   Collection Time: 03/06/21  6:01 AM  Result Value Ref Range   aPTT 35 24 - 36 seconds  Comment: Performed at [facility], [place] [address], [place], [place] [address]  POC occult blood, ED     Status: Abnormal   Collection Time: 03/06/21  6:53 AM  Result Value Ref Range   Fecal Occult Bld POSITIVE (A) NEGATIVE  Resp Panel by RT-PCR (Flu A&B, Covid) Nasopharyngeal Swab     Status: None   Collection Time: 03/06/21 11:58 AM   Specimen: Nasopharyngeal Swab; Nasopharyngeal(NP) swabs in vial transport medium  Result Value Ref Range   SARS Coronavirus 2 by RT PCR NEGATIVE NEGATIVE    Comment: (NOTE) SARS-CoV-2 target nucleic acids are NOT DETECTED.  The SARS-CoV-2 RNA is generally detectable in upper respiratory specimens during the acute phase of infection. The lowest concentration of SARS-CoV-2 viral copies this assay can detect is 138 copies/mL. A negative result does not preclude SARS-Cov-2 infection and should not be used as the sole basis for treatment or other patient management decisions. A negative result may occur with  improper specimen collection/handling, submission of specimen other than nasopharyngeal swab, presence of viral mutation(s) within the areas targeted by this assay, and inadequate number of viral copies(<138 copies/mL). A negative result must be combined with clinical observations, patient history, and epidemiological information. The expected result is Negative.  Fact Sheet for Patients:  EntrepreneurPulse.com.au  Fact Sheet for Healthcare Providers:  IncredibleEmployment.be  This test is no t yet approved or cleared by the [place] FDA and  has been authorized for detection and/or diagnosis of SARS-CoV-2 by FDA under an Emergency Use Authorization (EUA). This EUA will remain  in effect (meaning this test can be used) for the duration of  the COVID-19 declaration under Section 564(b)(1) of the Act, 21 U.S.C.section 360bbb-3(b)(1), unless the authorization is terminated  or revoked sooner.       Influenza A by PCR NEGATIVE NEGATIVE   Influenza B by PCR NEGATIVE NEGATIVE    Comment: (NOTE) The Xpert Xpress SARS-CoV-2/FLU/RSV plus assay is intended as an aid in the diagnosis of influenza from Nasopharyngeal swab specimens and should not be used as a sole basis for treatment. Nasal washings and aspirates are unacceptable for Xpert Xpress SARS-CoV-2/FLU/RSV testing.  Fact Sheet for Patients: EntrepreneurPulse.com.au  Fact Sheet for Healthcare Providers: IncredibleEmployment.be  This test is not yet approved or cleared by the [place] FDA and has been authorized for detection and/or diagnosis of SARS-CoV-2 by FDA under an Emergency Use Authorization (EUA). This EUA will remain in effect (meaning this test can be used) for the duration of the COVID-19 declaration under Section 564(b)(1) of the Act, 21 U.S.C. section 360bbb-3(b)(1), unless the authorization is terminated or revoked.  Performed at [facility], [place] [address], [place], [place] [address]   CBG monitoring, ED     Status: Abnormal   Collection Time: 03/06/21  4:57 PM  Result Value Ref Range   Glucose-Capillary 122 (H) 70 - 99 mg/dL    Comment: Glucose reference range applies only to samples taken after fasting for at least 8 hours.  CBC     Status: Abnormal   Collection Time: 03/06/21  5:00 PM  Result Value Ref Range   WBC 5.3 4.0 - 10.5 K/uL   RBC 3.31 (L) 3.87 - 5.11 MIL/uL   Hemoglobin 9.0 (L) 12.0 - 15.0 g/dL   HCT 31.4 (L) 36.0 - 46.0 %   MCV 94.9 80.0 - 100.0 fL   MCH 27.2 26.0 - 34.0 pg   MCHC 28.7 (L) 30.0 - 36.0 g/dL  RDW 12.1 11.5 - 15.5 %   Platelets 257 150 - 400 K/uL   nRBC 0.0 0.0 - 0.2 %    Comment: Performed at [facility], [place] [address],  [place], [place] [address]   CT ABDOMEN PELVIS W CONTRAST  Result Date: 03/06/2021 CLINICAL DATA:  Left lower quadrant pain.  Blood in stool. EXAM: CT ABDOMEN AND PELVIS WITH CONTRAST TECHNIQUE: Multidetector CT imaging of the abdomen and pelvis was performed using the standard protocol following bolus administration of intravenous contrast. CONTRAST:  52mL OMNIPAQUE IOHEXOL 350 MG/ML SOLN COMPARISON:  02/25/2021 FINDINGS: Lower chest: Unremarkable Hepatobiliary: No suspicious focal abnormality within the liver parenchyma. There is no evidence for gallstones, gallbladder wall thickening, or pericholecystic fluid. No intrahepatic or extrahepatic biliary dilation. Pancreas: No focal mass lesion. No dilatation of the main duct. No intraparenchymal cyst. No peripancreatic edema. Spleen: No splenomegaly. No focal mass lesion. Adrenals/Urinary Tract: No adrenal nodule or mass. Kidneys unremarkable. No evidence for hydroureter. Foley catheter decompresses the urinary bladder. Gas in the bladder lumen is compatible with the instrumentation. Stomach/Bowel: Stomach is unremarkable. No gastric wall thickening. No evidence of outlet obstruction. Duodenum is normally positioned as is the ligament of Treitz. No small bowel wall thickening. No small bowel dilatation. The terminal ileum is normal. The appendix is normal. There is mild wall thickening in the descending colon likely related to underdistention. Mild nonspecific wall thickening in the sigmoid colon with more prominent edematous wall thickening visible in the rectum (image 83/2) with perirectal and presacral edema. Vascular/Lymphatic: No abdominal aortic aneurysm. There is no gastrohepatic or hepatoduodenal ligament lymphadenopathy. No retroperitoneal or mesenteric lymphadenopathy. No pelvic sidewall lymphadenopathy. Reproductive: The uterus is unremarkable.  There is no adnexal mass. Other: No intraperitoneal free fluid. Musculoskeletal: No worrisome lytic or sclerotic  osseous abnormality. IMPRESSION: 1. Prominent edematous wall thickening in the rectum with perirectal and presacral edema. Imaging features suggest infectious/inflammatory proctitis. There is probably some associated wall thickening in the site distal sigmoid colon in features may reflect a left-sided infectious/inflammatory colitis 2. Foley catheter decompresses the urinary bladder. Gas in the bladder lumen is compatible with the instrumentation. Electronically Signed   By: Misty Stanley M.D.   On: 03/06/2021 09:32    Pending Labs Unresulted Labs (From admission, onward)     Start     Ordered   03/07/21 1914  Basic metabolic panel  Tomorrow morning,   R        03/06/21 1314   03/06/21 1700  CBC  5A & 5P,   R (with TIMED occurrences)      03/06/21 1314            Vitals/Pain Today's Vitals   03/06/21 1513 03/06/21 1530 03/06/21 1630 03/06/21 1943  BP: (!) 149/67 (!) 155/64 (!) 152/69 (!) 148/73  Pulse:  82 85 78  Resp:  14 (!) 23 17  Temp:      TempSrc:      SpO2:  100% 100% 100%  PainSc:        Isolation Precautions No active isolations  Medications Medications  fluconazole (DIFLUCAN) tablet 200 mg (200 mg Oral Given 03/06/21 1514)  amLODipine (NORVASC) tablet 2.5 mg (2.5 mg Oral Given 03/06/21 1513)  insulin detemir (LEVEMIR) injection 15 Units (has no administration in time range)  gabapentin (NEURONTIN) capsule 300 mg (has no administration in time range)  brimonidine (ALPHAGAN) 0.2 % ophthalmic solution 1 drop (1 drop Left Eye Not Given 03/06/21 1659)  dorzolamide-timolol (COSOPT) 22.3-6.8  MG/ML ophthalmic solution 1 drop (1 drop Left Eye Not Given 03/06/21 1520)  insulin aspart (novoLOG) injection 0-15 Units (2 Units Subcutaneous Given 03/06/21 1725)  lactated ringers infusion ( Intravenous New Bag/Given 03/06/21 1350)  acetaminophen (TYLENOL) tablet 650 mg (has no administration in time range)    Or  acetaminophen (TYLENOL) suppository 650 mg (has no administration  in time range)  morphine 2 MG/ML injection 2 mg (has no administration in time range)  ondansetron (ZOFRAN) tablet 4 mg (has no administration in time range)    Or  ondansetron (ZOFRAN) injection 4 mg (has no administration in time range)  hydrALAZINE (APRESOLINE) injection 5 mg (has no administration in time range)  insulin aspart (novoLOG) injection 0-5 Units (has no administration in time range)  sodium chloride flush (NS) 0.9 % injection 3 mL (has no administration in time range)  cefTRIAXone (ROCEPHIN) 2 g in sodium chloride 0.9 % 100 mL IVPB (has no administration in time range)  metroNIDAZOLE (FLAGYL) IVPB 500 mg (has no administration in time range)  iohexol (OMNIPAQUE) 350 MG/ML injection 80 mL (80 mLs Intravenous Contrast Given 03/06/21 0849)  metroNIDAZOLE (FLAGYL) IVPB 500 mg (0 mg Intravenous Stopped 03/06/21 1226)  cefTRIAXone (ROCEPHIN) 1 g in sodium chloride 0.9 % 100 mL IVPB (0 g Intravenous Stopped 03/06/21 1226)    Mobility walks with person assist Low fall risk   Focused Assessments    R Recommendations: See Admitting Provider Note  Report given to:   Additional Notes:

## 2021-03-06 NOTE — Discharge Instructions (Signed)
Per instructions from your prior hospitalization: Go to [facility] on 03/09/2021; at 10 AM with Dr. Cain Sieve Follow up with Jose Persia, MD (Internal Medicine) Schedule an appointment with [facility] (Rehabilitation) in 1 week (03/08/2021); Please call [phone] to schedule an appointment

## 2021-03-06 NOTE — H&P (Signed)
History and Physical    Jennifer [name] EUM:[id number] DOB: Jul 18, 1970 DOA: 03/06/2021  PCP: Jose Persia, MD Consultants:  [facility] - neurosurgery; Manuella Ghazi - ophthalmology;  Patient coming from:  Home - lives with husband and children; NOK:  Daughter, [phone]  Chief Complaint: Rectal bleeding  HPI: Jennifer [name] is a 50 y.o. female with medical history significant of afib on Eliquis;  L eye visual impairment; HTN; HLD; and DM who was admitted from 11/6-14 at [facility] for multifocal CVAs presenting with rectal bleeding.  She was ok when she left the hospital yesterday.  She got home and her stomach was hurting - she had complained about this in the hospital and she was told that it might be from all the medications.  It started about 230pm yesterday.  She started with stools maybe 3-4 times overnight and then noticed blood in the stool by the end.  She had frank blood with clots the last time.  She is having significant left flank pain.  Her last dose of Eliquis was yesterday AM, did not take medications last night.    ED Course: Discharged yesterday.  On Eliquis for afib.  Had some ?brain mets while here.  Went home and 4 bloody stools after dc.  Has foley for urinary retention, no apparent urinary symptoms.  +heme positive.  Plan to monitor Hgb for 1-2 days.  +LLQ pain - concern for colitis and proctitis.  No h/o IBD.  Started on Rocephin, Flagyl.    Review of Systems: As per HPI; otherwise review of systems reviewed and negative.   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   Annotation: secondary to  uveitis of unknown etiology Qualifier: Diagnosis of  By: Hilma Favors  DO, Beth     Hair loss    History of fracture of clavicle 05/18/2015   Hyperlipidemia    Hypertension    Hypertension associated with diabetes ([place]) 07/23/2016   Iron deficiency anemia 05/13/2013   Left anterior shoulder pain 07/06/2017   Pap smear abnormality of cervix with LGSIL    Routine/ritual circumcision    Type II diabetes mellitus ([place])    Uveitis     Past Surgical History:  Procedure Laterality Date   CESAREAN SECTION     x 1   EYE SURGERY Bilateral    Lasik   LEFT HEART CATH AND CORONARY ANGIOGRAPHY N/A 02/05/2021   Procedure: LEFT HEART CATH AND CORONARY ANGIOGRAPHY;  Surgeon: Dixie Dials, MD;  Location: [facility];  Service: Cardiovascular;  Laterality: N/A;   MULTIPLE TOOTH EXTRACTIONS     bottom denture   ORIF ANKLE FRACTURE Right 05/05/2020   Procedure: OPEN REDUCTION INTERNAL FIXATION (ORIF) ANKLE FRACTURE;  Surgeon: Shona Needles, MD;  Location: [facility];  Service: Orthopedics;  Laterality: Right;    Social History   Socioeconomic History   Marital status: Married    Spouse name: Not on file   Number of children: Not on file   Years of education: Not on file   Highest education level: Not on file  Occupational History   Not on file  Tobacco Use   Smoking status: Never   Smokeless tobacco: Never  Vaping Use   Vaping Use: Never used  Substance and Sexual Activity   Alcohol use: No   Drug use: No   Sexual activity: Not Currently    Birth control/protection: Post-menopausal  Other Topics Concern   Not on file  Social History Narrative   ** Merged History Encounter **       ** Merged History Encounter **  Pt is [place] Arabic.    Social Determinants of Health   Financial Resource Strain: High Risk   Difficulty of Paying Living Expenses: Very hard  Food Insecurity: No Food Insecurity   Worried About Charity fundraiser in the Last Year: Never true   Ran Out of Food in  the Last Year: Never true  Transportation Needs: No Transportation Needs   Lack of Transportation (Medical): No   Lack of Transportation (Non-Medical): No  Physical Activity: Inactive   Days of Exercise per Week: 0 days   Minutes of Exercise per Session: 0 min  Stress: Stress Concern Present   Feeling of Stress : To some extent  Social Connections: Moderately Isolated   Frequency of Communication with Friends and Family: More than three times a week   Frequency of Social Gatherings with Friends and Family: More than three times a week   Attends Religious Services: Never   Marine scientist or Organizations: No   Attends Music therapist: Never   Marital Status: Married  Human resources officer Violence: Not At Risk   Fear of Current or Ex-Partner: No   Emotionally Abused: No   Physically Abused: No   Sexually Abused: No    No Known Allergies  Family History  Problem Relation Age of Onset   Diabetes Father    Hypertension Other     Prior to Admission medications   Medication Sig Start Date End Date Taking? Authorizing Provider  amLODipine (NORVASC) 2.5 MG tablet Take 1 tablet (2.5 mg total) by mouth 2 (two) times daily. 02/07/21  Yes Jose Persia, MD  apixaban (ELIQUIS) 5 MG TABS tablet Take 1 tablet (5 mg total) by mouth 2 (two) times daily. 01/05/21  Yes Christian, Rylee, MD  calcium citrate-vitamin D (CITRACAL+D) 315-200 MG-UNIT tablet Take 2 tablets by mouth 2 (two) times daily. 01/05/21 01/05/22 Yes Christian, Rylee, MD  diclofenac Sodium (VOLTAREN) 1 % GEL Apply 2 g topically 4 (four) times daily as needed (foot pain).   Yes [provider]  dorzolamide-timolol (COSOPT) 22.3-6.8 MG/ML ophthalmic solution Place 1 drop into the left eye 2 (two) times daily. 10/16/20  Yes [provider]  feeding supplement (ENSURE ENLIVE / ENSURE PLUS) LIQD Take 237 mLs by mouth 2 (two) times daily between meals. 02/06/21  Yes Orvis Brill, MD  gabapentin  (NEURONTIN) 300 MG capsule Take 1 capsule (300 mg total) by mouth at bedtime. 03/05/21 03/05/22 Yes Amponsah, Charisse March, MD  insulin aspart (NOVOLOG) 100 UNIT/ML FlexPen Inject 3 Units into the skin 3 (three) times daily with meals. 02/09/21 03/11/21 Yes Virl Axe, MD  insulin detemir (LEVEMIR) 100 UNIT/ML FlexPen Inject 15 Units into the skin at bedtime 02/09/21 03/11/21 Yes Virl Axe, MD  linagliptin (TRADJENTA) 5 MG TABS tablet Take 1 tablet (5 mg total) by mouth daily. 02/09/21 03/11/21 Yes Virl Axe, MD  metFORMIN (GLUCOPHAGE-XR) 500 MG 24 hr tablet TAKE 2 TABLETS (1,000 MG TOTAL) BY MOUTH IN THE MORNING AND AT BEDTIME. 01/23/21 03/06/21 Yes Jose Persia,  MD  Accu-Chek FastClix Lancets MISC Check blood sugar 4 times a day 06/07/19   Jose Persia, MD  ACCU-CHEK GUIDE test strip CHECK BLOOD SUGAR 4 TIMES PER DAY 06/23/20   Mosetta Anis, MD  Blood Glucose Monitoring Suppl (ACCU-CHEK GUIDE) w/Device KIT 1 each by Does not apply route 4 (four) times daily. 11/19/18   Jose Persia, MD  brimonidine (ALPHAGAN) 0.2 % ophthalmic solution Place 1 drop into the left eye 3 (three) times daily. 08/01/20   [provider]  fluconazole (DIFLUCAN) 200 MG tablet Take 1 tablet (200 mg total) by mouth daily for 12 days. 03/06/21 03/18/21  Lacinda Axon, MD  Insulin Pen Needle (B-D UF III MINI PEN NEEDLES) 31G X 5 MM MISC Use pen needle daily for injections 06/23/20   Mosetta Anis, MD  Insulin Pen Needle (PENTIPS) 32G X 4 MM MISC Use as directed with insulin pen 11/29/20   Harvie Heck, MD  Continuous Blood Gluc Sensor (DEXCOM G6 SENSOR) MISC Use to check blood sugar at least 6 times a day Patient not taking: Reported on 08/29/2020 08/09/20 09/27/20  Harvie Heck, MD  Continuous Blood Gluc Transmit (DEXCOM G6 TRANSMITTER) MISC Use to check blood sugar at least 6 times a day Patient not taking: Reported on 08/29/2020 08/09/20 09/27/20  Harvie Heck, MD    Physical Exam: Vitals:   03/06/21  1400 03/06/21 1513 03/06/21 1530 03/06/21 1630  BP: (!) 149/75 (!) 149/67 (!) 155/64 (!) 152/69  Pulse: 82  82 85  Resp: 19  14 (!) 23  Temp:      TempSrc:      SpO2: 99%  100% 100%     General:  Appears calm and comfortable and is in NAD Eyes:   normal lids,L eye is clouded over ENT:  grossly normal hearing, lips & tongue, mmm Neck:  no LAD, masses or thyromegaly Cardiovascular:  RRR, no m/r/g. No LE edema.  Respiratory:   CTA bilaterally with no wheezes/rales/rhonchi.  Normal respiratory effort. Abdomen:  soft, mild to moderate LLQ TTP, ND, hypoactive BS Skin:  no rash or induration seen on limited exam Musculoskeletal:  grossly normal tone BUE/BLE, good ROM, no bony abnormality Psychiatric:  blunted mood and affect, speech fluent in Arabic Neurologic:  CN 2-12 grossly intact, moves all extremities in coordinated fashion    Radiological Exams on Admission: Independently reviewed - see discussion in A/P where applicable  CT ABDOMEN PELVIS W CONTRAST  Result Date: 03/06/2021 CLINICAL DATA:  Left lower quadrant pain.  Blood in stool. EXAM: CT ABDOMEN AND PELVIS WITH CONTRAST TECHNIQUE: Multidetector CT imaging of the abdomen and pelvis was performed using the standard protocol following bolus administration of intravenous contrast. CONTRAST:  22m OMNIPAQUE IOHEXOL 350 MG/ML SOLN COMPARISON:  02/25/2021 FINDINGS: Lower chest: Unremarkable Hepatobiliary: No suspicious focal abnormality within the liver parenchyma. There is no evidence for gallstones, gallbladder wall thickening, or pericholecystic fluid. No intrahepatic or extrahepatic biliary dilation. Pancreas: No focal mass lesion. No dilatation of the main duct. No intraparenchymal cyst. No peripancreatic edema. Spleen: No splenomegaly. No focal mass lesion. Adrenals/Urinary Tract: No adrenal nodule or mass. Kidneys unremarkable. No evidence for hydroureter. Foley catheter decompresses the urinary bladder. Gas in the bladder lumen is  compatible with the instrumentation. Stomach/Bowel: Stomach is unremarkable. No gastric wall thickening. No evidence of outlet obstruction. Duodenum is normally positioned as is the ligament of Treitz. No small bowel wall thickening. No small bowel dilatation. The terminal ileum is normal. The appendix is  normal. There is mild wall thickening in the descending colon likely related to underdistention. Mild nonspecific wall thickening in the sigmoid colon with more prominent edematous wall thickening visible in the rectum (image 83/2) with perirectal and presacral edema. Vascular/Lymphatic: No abdominal aortic aneurysm. There is no gastrohepatic or hepatoduodenal ligament lymphadenopathy. No retroperitoneal or mesenteric lymphadenopathy. No pelvic sidewall lymphadenopathy. Reproductive: The uterus is unremarkable.  There is no adnexal mass. Other: No intraperitoneal free fluid. Musculoskeletal: No worrisome lytic or sclerotic osseous abnormality. IMPRESSION: 1. Prominent edematous wall thickening in the rectum with perirectal and presacral edema. Imaging features suggest infectious/inflammatory proctitis. There is probably some associated wall thickening in the site distal sigmoid colon in features may reflect a left-sided infectious/inflammatory colitis 2. Foley catheter decompresses the urinary bladder. Gas in the bladder lumen is compatible with the instrumentation. Electronically Signed   By: Misty Stanley M.D.   On: 03/06/2021 09:32    EKG: None   Labs on Admission: I have personally reviewed the available labs and imaging studies at the time of the admission.  Pertinent labs:   Glucose 181 BUN 22/Creatinine 1.15/GFR 58 Albumin 3.1 WBC 6.1 Hgb 10.2 -> 9.0; 8.7 on 11/14 INR 1.1 Heme POSITIVE   Assessment/Plan Principal Problem:   Acute proctitis Active Problems:   Type II diabetes mellitus (HCC)   Atrial fibrillation (HCC)   Blind left eye   CKD (chronic kidney disease) stage 3, GFR 30-59  ml/min (HCC)   Brain lesion   Acute lower GI bleeding   Glaucoma   Acute urinary retention   At risk for medication noncompliance   Proctitis/colitis with GI bleeding -Patient hospitalized and discharged yesterday -Developed abdominal pain prior to dc and then had recurrent and bloody stools overnight -Imaging indicates proctitis/colitis -Will admit for ongoing management -GI consulted - paged and secure messaged Dr. Paulita Fujita -Treat with Cipro/Flagyl for now -Continue to monitor for recurrent bleeding -Initial Hgb better than prior; will trend BID for now -Hold Eliquis for now -Will provide clear liquid diet for now  Prior CVA/abnormal MRI -Seen by neurology during prior hospitalization -She is scheduled for repeat imaging in 4 weeks with plan for close outpatient neurology f/u -Concern for "severely uncontrolled vascular risk factors" noted and this appears to be her primary issue currently -Unfortunately, need to hold Eliquis for now given GI bleeding; suggest resumption as soon as is appropriate  Afib -She was previously on metoprolol but this was held during last hospitalization due to "soft BPs" and she was told to restart "slowly at your discretion" -Hold Eliquis due to active GI bleeding  DM -Recent A1c was 12.1, indicating very poor control -Continue Levemir -Hold Tradjenta, metformin -Cover with moderate-scale SSI  -She is currently taking Neurontin 300 mg qhs  HTN -Continue Norvasc  Stage 3a CKD -AKI during prior hospitalization has resolved -Appears to be at/near baseline at this time -Will follow  Glaucoma -L eye blindness -Followed by Dr. Manuella Ghazi at [place] [place]  Urinary retention -Has indwelling foley, plan for outpatient urology f/u in 1 week -Urine culture grew 80k yeast and she was started on 14 days of Diflucan  Medication noncompliance -Her daughter noted confusion about which meds she is supposed to be taking,  polypharmacy -Suggest close ongoing discussion and education regarding importance of medications during and after hospitalization -Suggest specific instructions given her language barrier and possible low medical literacy   Note: This patient has been tested and is negative for the novel coronavirus COVID-19. She has been  fully vaccinated against COVID-19.    DVT prophylaxis: SCDs Code Status:  Full - confirmed with patient/family Family Communication: Daughter was present and served as interpreter throughout the evaluation.  Disposition Plan:  The patient is from: home  Anticipated d/c is to: home without [facility] services   Anticipated d/c date will depend on clinical response to treatment, likely 2-3 days  Patient is currently: acutely ill Consults called: GI Admission status: Admit - It is my clinical opinion that admission to [place] is reasonable and necessary because of the expectation that this patient will require hospital care that crosses at least 2 midnights to treat this condition based on the medical complexity of the problems presented.  Given the aforementioned information, the predictability of an adverse outcome is felt to be significant.        Karmen Bongo MD [facility]   How to contact the [facility] Attending or Consulting provider [place] or covering provider during after hours [place], for this patient?  Check the care team in [facility] and look for a) attending/consulting [facility] provider listed and b) the [facility] team listed Log into [email or web address] and use [place]'s universal password to access. If you do not have the password, please contact the hospital operator. Locate the [facility] provider you are looking for under [facility] and page to a number that you can be directly reached. If you still have difficulty reaching the provider, please page the [facility] (Director on Call) for the Hospitalists listed on amion for assistance.   03/06/2021, 6:34 PM

## 2021-03-06 NOTE — ED Notes (Signed)
Patient transported to CT 

## 2021-03-07 DIAGNOSIS — N1832 Chronic kidney disease, stage 3b: Secondary | ICD-10-CM

## 2021-03-07 DIAGNOSIS — G939 Disorder of brain, unspecified: Secondary | ICD-10-CM

## 2021-03-07 DIAGNOSIS — Z9189 Other specified personal risk factors, not elsewhere classified: Secondary | ICD-10-CM

## 2021-03-07 DIAGNOSIS — E11319 Type 2 diabetes mellitus with unspecified diabetic retinopathy without macular edema: Secondary | ICD-10-CM

## 2021-03-07 DIAGNOSIS — R338 Other retention of urine: Secondary | ICD-10-CM

## 2021-03-07 DIAGNOSIS — K922 Gastrointestinal hemorrhage, unspecified: Secondary | ICD-10-CM

## 2021-03-07 DIAGNOSIS — N1831 Chronic kidney disease, stage 3a: Secondary | ICD-10-CM

## 2021-03-07 DIAGNOSIS — I480 Paroxysmal atrial fibrillation: Secondary | ICD-10-CM

## 2021-03-07 DIAGNOSIS — Z794 Long term (current) use of insulin: Secondary | ICD-10-CM

## 2021-03-07 LAB — BASIC METABOLIC PANEL
Anion gap: 5 (ref 5–15)
BUN: 18 mg/dL (ref 6–20)
CO2: 27 mmol/L (ref 22–32)
Calcium: 8.7 mg/dL — ABNORMAL LOW (ref 8.9–10.3)
Chloride: 109 mmol/L (ref 98–111)
Creatinine, Ser: 1.09 mg/dL — ABNORMAL HIGH (ref 0.44–1.00)
GFR, Estimated: >60 mL/min (ref >60)
Glucose, Bld: 83 mg/dL (ref 70–99)
Potassium: 4 mmol/L (ref 3.5–5.1)
Sodium: 141 mmol/L (ref 135–145)

## 2021-03-07 LAB — CBC
HCT: 26.4 % — ABNORMAL LOW (ref 36.0–46.0)
HCT: 27.6 % — ABNORMAL LOW (ref 36.0–46.0)
Hemoglobin: 8.2 g/dL — ABNORMAL LOW (ref 12.0–15.0)
Hemoglobin: 8.4 g/dL — ABNORMAL LOW (ref 12.0–15.0)
MCH: 27.7 pg (ref 26.0–34.0)
MCH: 27.1 pg (ref 26.0–34.0)
MCHC: 31.1 g/dL (ref 30.0–36.0)
MCHC: 30.4 g/dL (ref 30.0–36.0)
MCV: 89.2 fL (ref 80.0–100.0)
MCV: 89 fL (ref 80.0–100.0)
Platelets: 231 10*3/uL (ref 150–400)
Platelets: 248 10*3/uL (ref 150–400)
RBC: 2.96 MIL/uL — ABNORMAL LOW (ref 3.87–5.11)
RBC: 3.1 MIL/uL — ABNORMAL LOW (ref 3.87–5.11)
RDW: 12.3 % (ref 11.5–15.5)
RDW: 12.3 % (ref 11.5–15.5)
WBC: 5 10*3/uL (ref 4.0–10.5)
WBC: 5.6 10*3/uL (ref 4.0–10.5)
nRBC: 0 % (ref 0.0–0.2)
nRBC: 0 % (ref 0.0–0.2)

## 2021-03-07 LAB — VITAMIN B1: Vitamin B1 (Thiamine): 160.4 nmol/L (ref 66.5–200.0)

## 2021-03-07 LAB — GLUCOSE, CAPILLARY
Glucose-Capillary: 75 mg/dL (ref 70–99)
Glucose-Capillary: 180 mg/dL — ABNORMAL HIGH (ref 70–99)
Glucose-Capillary: 59 mg/dL — ABNORMAL LOW (ref 70–99)
Glucose-Capillary: 67 mg/dL — ABNORMAL LOW (ref 70–99)
Glucose-Capillary: 96 mg/dL (ref 70–99)
Glucose-Capillary: 242 mg/dL — ABNORMAL HIGH (ref 70–99)

## 2021-03-07 MED ORDER — LACTATED RINGERS IV SOLN: INTRAVENOUS | Status: AC

## 2021-03-07 NOTE — Consult Note (Signed)
Referring Provider: [facility] Primary Care Physician:  Jose Persia, MD Primary Gastroenterologist:  unassigned  Reason for Consultation:  rectal bleeding  HPI: Jennifer [name] is a 50 y.o. female Jennifer [name] is a 50 y.o. female with medical history significant of afib on Eliquis;  L eye visual impairment; HTN; HLD; and DM who was admitted from 11/6-14 at [facility] for multifocal CVAs presenting with rectal bleeding  Last dose of Eliquis 11/14 AM.  Patient does not speak english, daughter in the room to translate. Patient reported rectal bleeding starting Sunday night (11/13) around 2am. Had multiple episodes of bright red rectal bleeding. Appears as frank blood clots towards the end. States she has never seen blood in her stool before. Stools were described as hard at first and somewhat loose by the end of her BM. Straining. Had a BM this morning with a very small amount of bright red blood in stool. Denies abdominal pain, nausea, vomiting.  No prior history of colonoscopies. Denies family history of colon cancer or other GI malignancies.  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   Annotation: secondary to uveitis of unknown etiology Qualifier: Diagnosis of  By: Hilma Favors  DO, Beth     Hair loss    History of fracture of clavicle 05/18/2015   Hyperlipidemia    Hypertension    Hypertension associated with diabetes ([place]) 07/23/2016   Iron deficiency anemia 05/13/2013   Left anterior shoulder pain 07/06/2017   Pap smear abnormality of cervix with LGSIL    Routine/ritual circumcision    Type II diabetes mellitus ([place])    Uveitis     Past Surgical History:  Procedure Laterality Date    CESAREAN SECTION     x 1   EYE SURGERY Bilateral    Lasik   LEFT HEART CATH AND CORONARY ANGIOGRAPHY N/A 02/05/2021   Procedure: LEFT HEART CATH AND CORONARY ANGIOGRAPHY;  Surgeon: Dixie Dials, MD;  Location: [facility];  Service: Cardiovascular;  Laterality: N/A;   MULTIPLE TOOTH EXTRACTIONS     bottom denture   ORIF ANKLE FRACTURE Right 05/05/2020   Procedure: OPEN REDUCTION INTERNAL FIXATION (ORIF) ANKLE FRACTURE;  Surgeon: Shona Needles, MD;  Location: [facility];  Service: Orthopedics;  Laterality: Right;    Prior to Admission medications   Medication Sig Start Date End Date Taking? Authorizing Provider  amLODipine (NORVASC) 2.5 MG tablet Take 1 tablet (2.5 mg total) by mouth 2 (two) times daily. 02/07/21  Yes Jose Persia, MD  apixaban (ELIQUIS) 5 MG TABS tablet Take 1 tablet (5 mg total) by mouth 2 (two) times daily. 01/05/21  Yes Christian, Rylee, MD  calcium citrate-vitamin D (CITRACAL+D) 315-200 MG-UNIT tablet Take 2 tablets by mouth 2 (two) times daily. 01/05/21 01/05/22 Yes Christian, Rylee, MD  diclofenac Sodium (VOLTAREN) 1 % GEL Apply 2 g topically 4 (four) times daily as needed (foot pain).   Yes [provider]  dorzolamide-timolol (COSOPT) 22.3-6.8 MG/ML ophthalmic solution Place 1 drop into the left eye 2 (two) times daily. 10/16/20  Yes [provider]  feeding supplement (ENSURE ENLIVE / ENSURE PLUS) LIQD Take 237 mLs by mouth 2 (two) times daily between meals. 02/06/21  Yes  Orvis Brill, MD  gabapentin (NEURONTIN) 300 MG capsule Take 1 capsule (300 mg total) by mouth at bedtime. 03/05/21 03/05/22 Yes Amponsah, Charisse March, MD  insulin aspart (NOVOLOG) 100 UNIT/ML FlexPen Inject 3 Units into the skin 3 (three) times daily with meals. 02/09/21 03/11/21 Yes Virl Axe, MD  insulin detemir (LEVEMIR) 100 UNIT/ML FlexPen Inject 15 Units into the skin at bedtime 02/09/21 03/11/21 Yes Virl Axe, MD  linagliptin (TRADJENTA) 5 MG TABS tablet  Take 1 tablet (5 mg total) by mouth daily. 02/09/21 03/11/21 Yes Virl Axe, MD  metFORMIN (GLUCOPHAGE-XR) 500 MG 24 hr tablet TAKE 2 TABLETS (1,000 MG TOTAL) BY MOUTH IN THE MORNING AND AT BEDTIME. 01/23/21 03/06/21 Yes Jose Persia, MD  Accu-Chek FastClix Lancets MISC Check blood sugar 4 times a day 06/07/19   Jose Persia, MD  ACCU-CHEK GUIDE test strip CHECK BLOOD SUGAR 4 TIMES PER DAY 06/23/20   Mosetta Anis, MD  Blood Glucose Monitoring Suppl (ACCU-CHEK GUIDE) w/Device KIT 1 each by Does not apply route 4 (four) times daily. 11/19/18   Jose Persia, MD  brimonidine (ALPHAGAN) 0.2 % ophthalmic solution Place 1 drop into the left eye 3 (three) times daily. 08/01/20   [provider]  fluconazole (DIFLUCAN) 200 MG tablet Take 1 tablet (200 mg total) by mouth daily for 12 days. 03/06/21 03/18/21  Lacinda Axon, MD  Insulin Pen Needle (B-D UF III MINI PEN NEEDLES) 31G X 5 MM MISC Use pen needle daily for injections 06/23/20   Mosetta Anis, MD  Insulin Pen Needle (PENTIPS) 32G X 4 MM MISC Use as directed with insulin pen 11/29/20   Harvie Heck, MD  Continuous Blood Gluc Sensor (DEXCOM G6 SENSOR) MISC Use to check blood sugar at least 6 times a day Patient not taking: Reported on 08/29/2020 08/09/20 09/27/20  Harvie Heck, MD  Continuous Blood Gluc Transmit (DEXCOM G6 TRANSMITTER) MISC Use to check blood sugar at least 6 times a day Patient not taking: Reported on 08/29/2020 08/09/20 09/27/20  Harvie Heck, MD    Scheduled Meds:  amLODipine  2.5 mg Oral BID   brimonidine  1 drop Left Eye TID   Chlorhexidine Gluconate Cloth  6 each Topical Daily   dorzolamide-timolol  1 drop Left Eye BID   fluconazole  200 mg Oral Daily   gabapentin  300 mg Oral QHS   insulin aspart  0-15 Units Subcutaneous TID WC   insulin aspart  0-5 Units Subcutaneous QHS   insulin detemir  15 Units Subcutaneous QHS   sodium chloride flush  3 mL Intravenous Q12H   Continuous Infusions:  cefTRIAXone  (ROCEPHIN)  IV     lactated ringers 100 mL/hr at 03/07/21 0756   metronidazole Stopped (03/07/21 0018)   PRN Meds:.acetaminophen **OR** acetaminophen, hydrALAZINE, morphine injection, ondansetron **OR** ondansetron (ZOFRAN) IV  Allergies as of 03/06/2021   (No Known Allergies)    Family History  Problem Relation Age of Onset   Diabetes Father    Hypertension Other     Social History   Socioeconomic History   Marital status: Married    Spouse name: Not on file   Number of children: Not on file   Years of education: Not on file   Highest education level: Not on file  Occupational History   Not on file  Tobacco Use   Smoking status: Never   Smokeless tobacco: Never  Vaping Use   Vaping Use: Never used  Substance and Sexual Activity   Alcohol  use: No   Drug use: No   Sexual activity: Not Currently    Birth control/protection: Post-menopausal  Other Topics Concern   Not on file  Social History Narrative   ** Merged History Encounter **       ** Merged History Encounter **  Pt is [place].    Social Determinants of Health   Financial Resource Strain: High Risk   Difficulty of Paying Living Expenses: Very hard  Food Insecurity: No Food Insecurity   Worried About Charity fundraiser in the Last Year: Never true   Ran Out of Food in the Last Year: Never true  Transportation Needs: No Transportation Needs   Lack of Transportation (Medical): No   Lack of Transportation (Non-Medical): No  Physical Activity: Inactive   Days of Exercise per Week: 0 days   Minutes of Exercise per Session: 0 min  Stress: Stress Concern Present   Feeling of Stress : To some extent  Social Connections: Moderately Isolated   Frequency of Communication with Friends and Family: More than three times a week   Frequency of Social Gatherings with Friends and Family: More than three times a week   Attends Religious Services: Never   Marine scientist or Organizations: No   Attends  Music therapist: Never   Marital Status: Married  Human resources officer Violence: Not At Risk   Fear of Current or Ex-Partner: No   Emotionally Abused: No   Physically Abused: No   Sexually Abused: No    Review of Systems: Review of Systems  Constitutional:  Negative for chills and fever.  HENT:  Negative for ear discharge.   Eyes:  Negative for pain and redness.  Respiratory:  Negative for cough, sputum production and stridor.   Cardiovascular:  Negative for chest pain and palpitations.  Gastrointestinal:  Positive for blood in stool. Negative for abdominal pain, constipation, diarrhea, heartburn, melena, nausea and vomiting.  Genitourinary:  Negative for dysuria and urgency.  Musculoskeletal:  Negative for myalgias and neck pain.  Skin:  Negative for itching and rash.  Neurological:  Negative for dizziness and loss of consciousness.  Psychiatric/Behavioral:  Negative for substance abuse. The patient is not nervous/anxious.     Physical Exam:Physical Exam Constitutional:      General: She is not in acute distress.    Appearance: Normal appearance. She is not ill-appearing.  HENT:     Head: Normocephalic and atraumatic.     Nose: Nose normal. No congestion.     Mouth/Throat:     Mouth: Mucous membranes are moist.     Pharynx: Oropharynx is clear.  Eyes:     General: No scleral icterus.    Extraocular Movements: Extraocular movements intact.     Comments: Conjunctival pallor  Cardiovascular:     Rate and Rhythm: Normal rate and regular rhythm.  Pulmonary:     Effort: Pulmonary effort is normal. No respiratory distress.  Abdominal:     General: Abdomen is flat. There is no distension.     Palpations: Abdomen is soft. There is no mass.     Tenderness: There is no abdominal tenderness. There is no guarding or rebound.     Hernia: No hernia is present.  Musculoskeletal:        General: No swelling. Normal range of motion.     Cervical back: Normal range of motion  and neck supple.  Skin:    General: Skin is warm and dry.  Coloration: Skin is not jaundiced.  Neurological:     General: No focal deficit present.     Mental Status: She is alert and oriented to person, place, and time.  Psychiatric:        Mood and Affect: Mood normal.        Behavior: Behavior normal.        Thought Content: Thought content normal.        Judgment: Judgment normal.    Vital signs: Vitals:   03/07/21 0431 03/07/21 0753  BP: (!) 99/50 (!) 106/58  Pulse: 70 75  Resp: 18 18  Temp: 98.6 F (37 C) 98.6 F (37 C)  SpO2: 98% 100%   Last BM Date: 03/04/21    GI:  Lab Results: Recent Labs    03/06/21 0601 03/06/21 1700 03/07/21 0446  WBC 6.1 5.3 5.0  HGB 10.2* 9.0* 8.2*  HCT 32.6* 31.4* 26.4*  PLT 253 257 231   BMET Recent Labs    03/05/21 0809 03/06/21 0601 03/07/21 0446  NA 138 140 141  K 4.2 4.7 4.0  CL 104 106 109  CO2 '25 24 27  ' GLUCOSE 147* 181* 83  BUN 20 22* 18  CREATININE 1.12* 1.15* 1.09*  CALCIUM 8.9 9.4 8.7*   LFT Recent Labs    03/06/21 0601  PROT 7.5  ALBUMIN 3.1*  AST 17  ALT 18  ALKPHOS 107  BILITOT 0.4   PT/INR Recent Labs    03/06/21 0601  LABPROT 14.6  INR 1.1     Studies/Results: CT ABDOMEN PELVIS W CONTRAST  Result Date: 03/06/2021 CLINICAL DATA:  Left lower quadrant pain.  Blood in stool. EXAM: CT ABDOMEN AND PELVIS WITH CONTRAST TECHNIQUE: Multidetector CT imaging of the abdomen and pelvis was performed using the standard protocol following bolus administration of intravenous contrast. CONTRAST:  9m OMNIPAQUE IOHEXOL 350 MG/ML SOLN COMPARISON:  02/25/2021 FINDINGS: Lower chest: Unremarkable Hepatobiliary: No suspicious focal abnormality within the liver parenchyma. There is no evidence for gallstones, gallbladder wall thickening, or pericholecystic fluid. No intrahepatic or extrahepatic biliary dilation. Pancreas: No focal mass lesion. No dilatation of the main duct. No intraparenchymal cyst. No  peripancreatic edema. Spleen: No splenomegaly. No focal mass lesion. Adrenals/Urinary Tract: No adrenal nodule or mass. Kidneys unremarkable. No evidence for hydroureter. Foley catheter decompresses the urinary bladder. Gas in the bladder lumen is compatible with the instrumentation. Stomach/Bowel: Stomach is unremarkable. No gastric wall thickening. No evidence of outlet obstruction. Duodenum is normally positioned as is the ligament of Treitz. No small bowel wall thickening. No small bowel dilatation. The terminal ileum is normal. The appendix is normal. There is mild wall thickening in the descending colon likely related to underdistention. Mild nonspecific wall thickening in the sigmoid colon with more prominent edematous wall thickening visible in the rectum (image 83/2) with perirectal and presacral edema. Vascular/Lymphatic: No abdominal aortic aneurysm. There is no gastrohepatic or hepatoduodenal ligament lymphadenopathy. No retroperitoneal or mesenteric lymphadenopathy. No pelvic sidewall lymphadenopathy. Reproductive: The uterus is unremarkable.  There is no adnexal mass. Other: No intraperitoneal free fluid. Musculoskeletal: No worrisome lytic or sclerotic osseous abnormality. IMPRESSION: 1. Prominent edematous wall thickening in the rectum with perirectal and presacral edema. Imaging features suggest infectious/inflammatory proctitis. There is probably some associated wall thickening in the site distal sigmoid colon in features may reflect a left-sided infectious/inflammatory colitis 2. Foley catheter decompresses the urinary bladder. Gas in the bladder lumen is compatible with the instrumentation. Electronically Signed   By: EVerda CuminsD.  On: 03/06/2021 09:32    Impression: Proctitis/colitis with GI bleeding - CT abdomen/Pelvis w contrast 11/15: edematous wall thickening in rectum with perirectal and presacral edema. Suggest infectious/inflammatory proctitis. Wall thickening in distal sigmoid  which may reflect left-sided infectious/inflammatory colitis. - WBC 5.0 - HGB 8.2 (10.2 on 03/06/21) - BUN 22, Cr. 1.15 - currently on Cipro/flagyl  Afib  DM  HTN  Plan: Rectal bleeding that appears to be improving. No prior history of colonoscopy. Continue Cipro/Flagyl Continue to monitor for recurrent bleeding. Will likely need a colonoscopy at some point, however, may consider doing outpatient. If bleeding continues or worsens can consider inpatient colonoscopy. Continue daily CBC and transfuse as needed to maintain HGB > 7  [facility] will follow.    LOS: 1 day   Garnette Scheuermann  PA-C 03/07/2021, 8:09 AM  Contact #  [phone]

## 2021-03-07 NOTE — Progress Notes (Signed)
PROGRESS NOTE    Jennifer [name]  GYF:[id number] DOB: 1970/12/17 DOA: 03/06/2021 PCP: Jose Persia, MD  Brief Narrative:  The patient is a 50 year old Arabic female with a past medical history significant for but not limited to atrial fibrillation on anticoagulation with Eliquis, history of left eye visual impairment, hypertension, hyperlipidemia as well as diabetes mellitus type 2 who was recently admitted to [facility] from 02/25/2021 until 03/05/2021 for multifocal CVAs who now presents to the hospital for rectal bleeding.  She states that she is okay when she left the hospital the day before yesterday but when she got home her stomach was hurting and she complained about this in the hospital and told that it might be for medications.  He states that it started around 2:30 PM and she states that she started having 3-4 stools overnight and noticed blood in the stool by the end she had frank blood clots last time.  She was having left flank pain and her last dose of Eliquis was yesterday morning and did not take any medications last night.  Upon arrival to the ED she had 4 bloody stools and has a Foley catheter for retention.  She is heme positive and GI was consulted.  CT of the abdomen pelvis showed concern for colitis and proctitis so she is started on IV Rocephin and Flagyl.  Rectal bleeding has improved however hemoglobin is dropped.  GI recommends continue to monitor closely  Assessment & Plan:   Principal Problem:   Acute proctitis Active Problems:   Type II diabetes mellitus (HCC)   Atrial fibrillation (HCC)   Blind left eye   CKD (chronic kidney disease) stage 3, GFR 30-59 ml/min (HCC)   Brain lesion   Acute lower GI bleeding   Glaucoma   Acute urinary retention   At risk for medication noncompliance   Proctitis/Colitis with GI bleeding -She was hospitalized from 02/25/21 until 03/05/2021 and discharged yesterday -She developed abdominal pain prior to discharge and then  had recurrent bloody stools overnight -CT scan showed "Prominent edematous wall thickening in the rectum with perirectal and presacral edema. Imaging features suggest infectious/inflammatory proctitis. There is probably some associated wall thickening in the site distal sigmoid colon in features may reflect a left-sided infectious/inflammatory colitis. Foley catheter decompresses the urinary bladder. Gas in the bladder lumen is compatible with the instrumentation." -She is admitted for ongoing management of her abdominal pain and bloody bowel movement have improved -She was initiated on IV Cipro Flagyl -Hemoglobin has dropped and now went from 10.2/32.6 -> 9.0/31.4 and is now 8.2/26.4 -Continue to hold Eliquis for now -She is initiated on a clear liquid diet for now and will need further diet advancement per GI -GI feels that this is a difficult case given her recent CVA and they recommended her for bleeding so long they would be reluctant to do a colonoscopy on the other hand if her worsens they can do a sigmoidoscopy versus minimal or no sedation. -GI recommends holding Eliquis for the next 5 days if at all possible but if felt to be a high risk from stroke recurrence standpoint consider IV heparin and see how she does -She was started on IVF with LR at 100 mL/hr and will reduce to 75 mL/hr for 12 more hours -C/w Supportive Care with Antiemtics with Ondansetron 4 mg po/IV q6hprn Nause  -We will discuss with neurology about Anticoagulation   Prior CVA/abnormal MRI -She was seen by neurology during the prior hospitalization -She is scheduled  for repeat imaging in 4 weeks with plan for close neurology outpatient follow-up -There is concern for severely uncontrolled vascular factors noted and appears that this may be her primary issue -Unfortunately we will continue to hold Eliquis for now given her GI bleeding and GI recommends for 5 days; may consider resuming anticoagulation with heparin drip -We  will discuss with neurology  Atrial fibrillation -She was previously on metoprolol but this was held during last admission due to soft blood pressure and she is to restart slowly -Currently holding anticoagulation of the cause given her GI bleeding  Uncontrolled diabetes mellitus type 2 with hyperglycemia -Globin AC was 12.1 indicating very poor control -She is initiated on Levemir which we will continue at 12 units  -We will continue to hold Tradjenta and metformin -Continue with <Moderate NovoLog/scale insulin before meals and at bedtime -She is currently taking gabapentin 300 mg p.o. nightly -CBGs ranging from 75-180  HTN -Continue Amlodipine 2.5 mg pO BID -Continue to Monitor BP per Protocol -C/w Hydralazine 5 mg IV q4hprn High Blood Pressure  -Last BP reading was 110/64  Stage 3a CKD -She had an AKI during her prior hospitalization which is now resolved -Creatinine appears to be her baseline and today showed BUN/creatinine of 8/1.09 -Getting IVF as above -Avoid nephrotoxic medications, contrast dyes, hypotension and dehydration and renally dose medications -Continue to monitor and trend renal function repeat CMP in a.m.  Glaucoma -She has Left Eye Blindness -Followed by Dr. Manuella Ghazi at [facility] -C/w Brimonidine 1 drop Left eye TID and Dorzolamide-Timolol 22.3-6.8 1 drop Left Eye BID  Urinary Retention  -Has an indwelling Foley with Plan for Outpatient Urology F/U within 1 week -Urine Cx Grew 80 Yeast and she was started on 14 Days of Diflucan 200 mg po Daily    Medication Noncompliance  -Her daughter noted confusion about which meds she is supposed to be taking, polypharmacy -Suggest close ongoing discussion and education regarding importance of medications during and after hospitalization -Suggest specific instructions given her language barrier and possible low medical literacy   DVT prophylaxis: SCDs Code Status: FULL CODE  Family Communication: None Disposition  Plan: Pending GI Clearance  Status is: Inpatient  Remains inpatient appropriate because: She needs GI clearance prior to safe discharge disposition  Consultants:  Gastroenterology   Procedures:  None  Antimicrobials: Anti-infectives (From admission, onward)    Start     Dose/Rate Route Frequency Ordered Stop   03/07/21 1000  cefTRIAXone (ROCEPHIN) 2 g in sodium chloride 0.9 % 100 mL IVPB        2 g 200 mL/hr over 30 Minutes Intravenous Every 24 hours 03/06/21 1314     03/06/21 2200  metroNIDAZOLE (FLAGYL) IVPB 500 mg        500 mg 100 mL/hr over 60 Minutes Intravenous Every 12 hours 03/06/21 1314     03/06/21 1445  fluconazole (DIFLUCAN) tablet 200 mg        200 mg Oral Daily 03/06/21 1314     03/06/21 1030  metroNIDAZOLE (FLAGYL) IVPB 500 mg        500 mg 100 mL/hr over 60 Minutes Intravenous  Once 03/06/21 1021 03/06/21 1226   03/06/21 1030  cefTRIAXone (ROCEPHIN) 1 g in sodium chloride 0.9 % 100 mL IVPB        1 g 200 mL/hr over 30 Minutes Intravenous  Once 03/06/21 1021 03/06/21 1226        Subjective: Seen and examined at bedside with the assistance of  the translator Claudine [phone] and the patient states that she is doing much better today.  States her abdominal pain is improved.  Has not had any more GI bleeding.  Feels okay.  No other concerns or complaints this time.  Objective: Vitals:   03/06/21 2317 03/07/21 0431 03/07/21 0753 03/07/21 1310  BP: 126/67 (!) 99/50 (!) 106/58 110/64  Pulse: 77 70 75 73  Resp: 16 18 18    Temp: 98 F (36.7 C) 98.6 F (37 C) 98.6 F (37 C) 98.9 F (37.2 C)  TempSrc: Oral Oral Oral Oral  SpO2: 100% 98% 100% 100%  Weight:      Height:        Intake/Output Summary (Last 24 hours) at 03/07/2021 1357 Last data filed at 03/07/2021 1313 Gross per 24 hour  Intake 1163.28 ml  Output 300 ml  Net 863.28 ml   Filed Weights   03/06/21 2100  Weight: 68.7 kg   Examination: Physical Exam:  Constitutional: WN/WD Arabic female  in NAD and appears calm and comfortable Eyes: Has left eye blindness from her glaucoma ENMT: External Ears, Nose appear normal. Grossly normal hearing. Mucous membranes are moist.  Neck: Appears normal, supple, no cervical masses, normal ROM, no appreciable thyromegaly; no appreciable JVD Respiratory: Diminished to auscultation bilaterally, no wheezing, rales, rhonchi or crackles. Normal respiratory effort and patient is not tachypenic. No accessory muscle use.  Unlabored breathing Cardiovascular: RRR, no murmurs / rubs / gallops. S1 and S2 auscultated. No extremity edema.  Abdomen: Soft, non-tender, distended slightly. Bowel sounds positive.  GU: Deferred. Foley in place  Musculoskeletal: No clubbing / cyanosis of digits/nails. No joint deformity upper and lower extremities.  Skin: No rashes, lesions, ulcers on a limited skin evaluation. No induration; Warm and dry.  Neurologic: CN 2-12 grossly intact with no focal deficits. Romberg sign and cerebellar reflexes not assessed.  Psychiatric: Normal judgment and insight. Alert and oriented x 3. Normal mood and appropriate affect.   Data Reviewed: I have personally reviewed following labs and imaging studies  CBC: Recent Labs  Lab 03/04/21 0847 03/05/21 0809 03/06/21 0601 03/06/21 1700 03/07/21 0446  WBC 6.0 5.4 6.1 5.3 5.0  NEUTROABS  --   --  4.1  --   --   HGB 8.9* 8.7* 10.2* 9.0* 8.2*  HCT 28.3* 27.6* 32.6* 31.4* 26.4*  MCV 87.9 88.2 89.1 94.9 89.2  PLT 190 183 253 257 007   Basic Metabolic Panel: Recent Labs  Lab 03/01/21 0356 03/02/21 0329 03/03/21 0301 03/04/21 0847 03/05/21 0809 03/06/21 0601 03/07/21 0446  NA 133* 136 137 137 138 140 141  K 4.7 4.5 3.7 4.0 4.2 4.7 4.0  CL 101 103 104 103 104 106 109  CO2 25 26 26 25 25 24 27   GLUCOSE 133* 122* 119* 205* 147* 181* 83  BUN 66* 61* 37* 25* 20 22* 18  CREATININE 4.19* 3.09* 1.49* 1.29* 1.12* 1.15* 1.09*  CALCIUM 9.6 9.5 9.4 8.9 8.9 9.4 8.7*  MG 2.2 2.3  --   --   --    --   --   PHOS 4.5  --  4.1 4.3  --   --   --    GFR: Estimated Creatinine Clearance: 60.7 mL/min (A) (by C-G formula based on SCr of 1.09 mg/dL (H)). Liver Function Tests: Recent Labs  Lab 03/01/21 0356 03/02/21 0329 03/03/21 0301 03/04/21 0847 03/06/21 0601  AST  --  41  --   --  17  ALT  --  32  --   --  18  ALKPHOS  --  106  --   --  107  BILITOT  --  0.3  --   --  0.4  PROT  --  5.7*  --   --  7.5  ALBUMIN 2.6* 2.4* 2.4* 2.2* 3.1*   No results for input(s): LIPASE, AMYLASE in the last 168 hours. Recent Labs  Lab 03/01/21 0640  AMMONIA 19   Coagulation Profile: Recent Labs  Lab 03/01/21 1223 03/06/21 0601  INR 1.0 1.1   Cardiac Enzymes: Recent Labs  Lab 03/01/21 0356  CKTOTAL 15*   BNP (last 3 results) No results for input(s): PROBNP in the last 8760 hours. HbA1C: No results for input(s): HGBA1C in the last 72 hours. CBG: Recent Labs  Lab 03/05/21 1533 03/06/21 1657 03/06/21 2121 03/07/21 0726 03/07/21 1149  GLUCAP 167* 122* 127* 75 180*   Lipid Profile: No results for input(s): CHOL, HDL, LDLCALC, TRIG, CHOLHDL, LDLDIRECT in the last 72 hours. Thyroid Function Tests: No results for input(s): TSH, T4TOTAL, FREET4, T3FREE, THYROIDAB in the last 72 hours. Anemia Panel: No results for input(s): VITAMINB12, FOLATE, FERRITIN, TIBC, IRON, RETICCTPCT in the last 72 hours. Sepsis Labs: No results for input(s): PROCALCITON, LATICACIDVEN in the last 168 hours.  Recent Results (from the past 240 hour(s))  Resp Panel by RT-PCR (Flu A&B, Covid) Nasopharyngeal Swab     Status: None   Collection Time: 02/25/21  7:33 PM   Specimen: Nasopharyngeal Swab; Nasopharyngeal(NP) swabs in vial transport medium  Result Value Ref Range Status   SARS Coronavirus 2 by RT PCR NEGATIVE NEGATIVE Final    Comment: (NOTE) SARS-CoV-2 target nucleic acids are NOT DETECTED.  The SARS-CoV-2 RNA is generally detectable in upper respiratory specimens during the acute phase of  infection. The lowest concentration of SARS-CoV-2 viral copies this assay can detect is 138 copies/mL. A negative result does not preclude SARS-Cov-2 infection and should not be used as the sole basis for treatment or other patient management decisions. A negative result may occur with  improper specimen collection/handling, submission of specimen other than nasopharyngeal swab, presence of viral mutation(s) within the areas targeted by this assay, and inadequate number of viral copies(<138 copies/mL). A negative result must be combined with clinical observations, patient history, and epidemiological information. The expected result is Negative.  Fact Sheet for Patients:  EntrepreneurPulse.com.au  Fact Sheet for Healthcare Providers:  IncredibleEmployment.be  This test is no t yet approved or cleared by the [place] FDA and  has been authorized for detection and/or diagnosis of SARS-CoV-2 by FDA under an Emergency Use Authorization (EUA). This EUA will remain  in effect (meaning this test can be used) for the duration of the COVID-19 declaration under Section 564(b)(1) of the Act, 21 U.S.C.section 360bbb-3(b)(1), unless the authorization is terminated  or revoked sooner.       Influenza A by PCR NEGATIVE NEGATIVE Final   Influenza B by PCR NEGATIVE NEGATIVE Final    Comment: (NOTE) The Xpert Xpress SARS-CoV-2/FLU/RSV plus assay is intended as an aid in the diagnosis of influenza from Nasopharyngeal swab specimens and should not be used as a sole basis for treatment. Nasal washings and aspirates are unacceptable for Xpert Xpress SARS-CoV-2/FLU/RSV testing.  Fact Sheet for Patients: EntrepreneurPulse.com.au  Fact Sheet for Healthcare Providers: IncredibleEmployment.be  This test is not yet approved or cleared by the [place] FDA and has been authorized for detection and/or diagnosis of SARS-CoV-2  by FDA under an  Emergency Use Authorization (EUA). This EUA will remain in effect (meaning this test can be used) for the duration of the COVID-19 declaration under Section 564(b)(1) of the Act, 21 U.S.C. section 360bbb-3(b)(1), unless the authorization is terminated or revoked.  Performed at [facility], [place] [address], [place], [place] [address]   Culture, blood (routine x 2)     Status: None   Collection Time: 02/27/21 12:06 PM   Specimen: BLOOD  Result Value Ref Range Status   Specimen Description BLOOD LEFT ANTECUBITAL  Final   Special Requests   Final    BOTTLES DRAWN AEROBIC AND ANAEROBIC Blood Culture adequate volume   Culture   Final    NO GROWTH 5 DAYS Performed at [facility], [place] [address] [place], [place] [address]    Report Status 03/04/2021 FINAL  Final  Culture, blood (routine x 2)     Status: None   Collection Time: 02/27/21 12:13 PM   Specimen: BLOOD LEFT HAND  Result Value Ref Range Status   Specimen Description BLOOD LEFT HAND  Final   Special Requests   Final    BOTTLES DRAWN AEROBIC AND ANAEROBIC Blood Culture adequate volume   Culture   Final    NO GROWTH 5 DAYS Performed at [facility], [place] [address] [place], [place] [address]    Report Status 03/04/2021 FINAL  Final  Surgical pcr screen     Status: None   Collection Time: 03/01/21  7:38 AM   Specimen: Nasal Mucosa; Nasal Swab  Result Value Ref Range Status   MRSA, PCR NEGATIVE NEGATIVE Final   Staphylococcus aureus NEGATIVE NEGATIVE Final    Comment: (NOTE) The Xpert SA Assay (FDA approved for NASAL specimens in patients 68 years of age and older), is one component of a comprehensive surveillance program. It is not intended to diagnose infection nor to guide or monitor treatment. Performed at [facility], [place] [address] [place], [place] [address]   Urine Culture     Status: Abnormal   Collection Time: 03/01/21 12:05 PM   Specimen: Urine, Catheterized  Result  Value Ref Range Status   Specimen Description URINE, CATHETERIZED  Final   Special Requests   Final    NONE Performed at [facility], [place] [address], [place], [place] [address]    Culture 80,000 COLONIES/mL YEAST (A)  Final   Report Status 03/02/2021 FINAL  Final  Fungus culture, blood     Status: None (Preliminary result)   Collection Time: 03/01/21  2:26 PM   Specimen: BLOOD  Result Value Ref Range Status   Specimen Description BLOOD RIGHT ANTECUBITAL  Final   Special Requests   Final    BOTTLES DRAWN AEROBIC ONLY Blood Culture adequate volume   Culture   Final    NO GROWTH 6 DAYS Performed at [facility], [place] [address], [place], [place] [address]    Report Status PENDING  Incomplete  CSF culture w Gram Stain     Status: None   Collection Time: 03/01/21  4:50 PM   Specimen: CSF; Cerebrospinal Fluid  Result Value Ref Range Status   Specimen Description CSF  Final   Special Requests NONE  Final   Gram Stain   Final    WBC PRESENT, PREDOMINANTLY MONONUCLEAR NO ORGANISMS SEEN CYTOSPIN SMEAR    Culture   Final    NO GROWTH 3 DAYS Performed at [facility], [place]  [address], [place], [place] [address]    Report Status 03/05/2021 FINAL  Final  Fungus Culture With Stain     Status: None (Preliminary result)   Collection Time: 03/01/21  4:50 PM  Result Value Ref Range Status   Fungus Stain Final report  Final    Comment: (NOTE) Performed At: [facility] [place], [place] [id number] Rush Farmer MD BZ:[id number]    Fungus (Mycology) Culture PENDING  Incomplete   Fungal Source CSF  Final    Comment: Performed at [facility], [place] [address] [place], [place] [address]  Acid Fast Smear (AFB)     Status: None   Collection Time: 03/01/21  4:50 PM   Specimen: Cerebrospinal Fluid  Result Value Ref Range Status   AFB Specimen Processing Concentration  Final   Acid Fast Smear Negative  Final    Comment: (NOTE) Performed At: [facility] [place], [place] [id number] Rush Farmer MD EU:[id number]    Source (AFB) CSF  Final    Comment: Performed at [facility], [place] [address] [place], [place] [address]  Fungus Culture Result     Status: None   Collection Time: 03/01/21  4:50 PM  Result Value Ref Range Status   Result 1 Comment  Final    Comment: (NOTE) KOH/Calcofluor preparation:  no fungus observed. Performed At: [facility] [place], [place] [id number] Rush Farmer MD OI:[id number]   Andee Poles, PCR     Status: None   Collection Time: 03/01/21  8:26 PM  Result Value Ref Range Status   Toxoplasma Gondii, PCR Negative Negative Final    Comment: (NOTE) No Toxoplasma gondii DNA detected. This test was developed and its performance characteristics determined by [employer]. It has not been cleared or approved by the U.S. Food and Drug Administration. The FDA has determined that such clearance or approval is not necessary. This test is used for clinical purposes. It should not be regarded as investigational or research. Performed At: [facility] [place], [place] [id number] Rush Farmer MD JQ:[id number]   Aspergillus Ag, BAL/Serum     Status: None   Collection Time: 03/01/21  8:26 PM  Result Value Ref Range Status   Aspergillus Ag, BAL/Serum 0.04 0.00 - 0.49 Index Final    Comment: (NOTE) Performed At: [facility] [place], [place] [id number] Rush Farmer MD JM:[id number]   Resp Panel by RT-PCR (Flu A&B, Covid) Nasopharyngeal Swab     Status: None   Collection Time: 03/06/21 11:58 AM   Specimen: Nasopharyngeal Swab; Nasopharyngeal(NP) swabs in vial transport medium  Result Value Ref Range Status   SARS Coronavirus 2 by RT PCR NEGATIVE NEGATIVE Final    Comment: (NOTE) SARS-CoV-2 target nucleic acids are NOT DETECTED.  The SARS-CoV-2 RNA is generally detectable in upper  respiratory specimens during the acute phase of infection. The lowest concentration of SARS-CoV-2 viral copies this assay can detect is 138 copies/mL. A negative result does not preclude SARS-Cov-2 infection and should not be used as the sole basis for treatment or other patient management decisions. A negative result may occur with  improper specimen collection/handling, submission of specimen other than nasopharyngeal swab, presence of viral mutation(s) within the areas targeted by this assay, and inadequate number of viral copies(<138 copies/mL). A negative result must be combined with clinical observations, patient history, and epidemiological information. The expected result is Negative.  Fact Sheet for Patients:  EntrepreneurPulse.com.au  Fact Sheet for Healthcare Providers:  IncredibleEmployment.be  This test is no t yet approved or cleared by the [place] FDA and  has been authorized for detection and/or diagnosis of SARS-CoV-2 by FDA under an Emergency Use Authorization (EUA). This EUA will remain  in effect (meaning this test can be used) for the duration of the COVID-19 declaration under Section 564(b)(1) of the Act, 21 U.S.C.section 360bbb-3(b)(1), unless the authorization is terminated  or revoked sooner.       Influenza A by PCR NEGATIVE NEGATIVE Final   Influenza B by PCR NEGATIVE NEGATIVE Final    Comment: (NOTE) The Xpert Xpress SARS-CoV-2/FLU/RSV plus assay is intended as an aid in the diagnosis of influenza from Nasopharyngeal swab specimens and should not be used as a sole basis for treatment. Nasal washings and aspirates are unacceptable for Xpert Xpress SARS-CoV-2/FLU/RSV testing.  Fact Sheet for Patients: EntrepreneurPulse.com.au  Fact Sheet for Healthcare Providers: IncredibleEmployment.be  This test is not yet approved or cleared by the [place] FDA and has been  authorized for detection and/or diagnosis of SARS-CoV-2 by FDA under an Emergency Use Authorization (EUA). This EUA will remain in effect (meaning this test can be used) for the duration of the COVID-19 declaration under Section 564(b)(1) of the Act, 21 U.S.C. section 360bbb-3(b)(1), unless the authorization is terminated or revoked.  Performed at [facility], [place] [address], [place], [place] [address]     RN Pressure Injury Documentation:     Estimated body mass index is 23.72 kg/m as calculated from the following:   Height as of this encounter: 5\' 7"  (1.702 m).   Weight as of this encounter: 68.7 kg.  Malnutrition Type:   Malnutrition Characteristics:   Nutrition Interventions:    Radiology Studies: CT ABDOMEN PELVIS W CONTRAST  Result Date: 03/06/2021 CLINICAL DATA:  Left lower quadrant pain.  Blood in stool. EXAM: CT ABDOMEN AND PELVIS WITH CONTRAST TECHNIQUE: Multidetector CT imaging of the abdomen and pelvis was performed using the standard protocol following bolus administration of intravenous contrast. CONTRAST:  110mL OMNIPAQUE IOHEXOL 350 MG/ML SOLN COMPARISON:  02/25/2021 FINDINGS: Lower chest: Unremarkable Hepatobiliary: No suspicious focal abnormality within the liver parenchyma. There is no evidence for gallstones, gallbladder wall thickening, or pericholecystic fluid. No intrahepatic or extrahepatic biliary dilation. Pancreas: No focal mass lesion. No dilatation of the main duct. No intraparenchymal cyst. No peripancreatic edema. Spleen: No splenomegaly. No focal mass lesion. Adrenals/Urinary Tract: No adrenal nodule or mass. Kidneys unremarkable. No evidence for hydroureter. Foley catheter decompresses the urinary bladder. Gas in the bladder lumen is compatible with the instrumentation. Stomach/Bowel: Stomach is unremarkable. No gastric wall thickening. No evidence of outlet obstruction. Duodenum is normally positioned as is the ligament of Treitz. No  small bowel wall thickening. No small bowel dilatation. The terminal ileum is normal. The appendix is normal. There is mild wall thickening in the descending colon likely related to underdistention. Mild nonspecific wall thickening in the sigmoid colon with more prominent edematous wall thickening visible in the rectum (image 83/2) with perirectal and presacral edema. Vascular/Lymphatic: No abdominal aortic aneurysm. There is no gastrohepatic or hepatoduodenal ligament lymphadenopathy. No retroperitoneal or mesenteric lymphadenopathy. No pelvic sidewall lymphadenopathy. Reproductive: The uterus is unremarkable.  There is no adnexal mass. Other: No intraperitoneal free fluid. Musculoskeletal: No worrisome lytic or sclerotic osseous abnormality. IMPRESSION: 1. Prominent edematous wall thickening in the rectum with perirectal and presacral edema. Imaging features suggest infectious/inflammatory proctitis. There is probably some associated wall thickening in  the site distal sigmoid colon in features may reflect a left-sided infectious/inflammatory colitis 2. Foley catheter decompresses the urinary bladder. Gas in the bladder lumen is compatible with the instrumentation. Electronically Signed   By: Misty Stanley M.D.   On: 03/06/2021 09:32    Scheduled Meds:  amLODipine  2.5 mg Oral BID   brimonidine  1 drop Left Eye TID   Chlorhexidine Gluconate Cloth  6 each Topical Daily   dorzolamide-timolol  1 drop Left Eye BID   fluconazole  200 mg Oral Daily   gabapentin  300 mg Oral QHS   insulin aspart  0-15 Units Subcutaneous TID WC   insulin aspart  0-5 Units Subcutaneous QHS   insulin detemir  15 Units Subcutaneous QHS   sodium chloride flush  3 mL Intravenous Q12H   Continuous Infusions:  cefTRIAXone (ROCEPHIN)  IV 2 g (03/07/21 1040)   lactated ringers 100 mL/hr at 03/07/21 0756   metronidazole 500 mg (03/07/21 1121)    LOS: 1 day   Kerney Elbe, DO [facility] PAGER is on [place]  If  7PM-7AM, please contact night-coverage [email or web address]

## 2021-03-08 ENCOUNTER — Other Ambulatory Visit: Payer: Self-pay

## 2021-03-08 LAB — CBC WITH DIFFERENTIAL/PLATELET
Abs Immature Granulocytes: 0.03 10*3/uL (ref 0.00–0.07)
Basophils Absolute: 0 10*3/uL (ref 0.0–0.1)
Basophils Relative: 1 %
Eosinophils Absolute: 0.2 10*3/uL (ref 0.0–0.5)
Eosinophils Relative: 5 %
HCT: 27.7 % — ABNORMAL LOW (ref 36.0–46.0)
Hemoglobin: 8.6 g/dL — ABNORMAL LOW (ref 12.0–15.0)
Immature Granulocytes: 1 %
Lymphocytes Relative: 41 %
Lymphs Abs: 2 10*3/uL (ref 0.7–4.0)
MCH: 27.7 pg (ref 26.0–34.0)
MCHC: 31 g/dL (ref 30.0–36.0)
MCV: 89.4 fL (ref 80.0–100.0)
Monocytes Absolute: 0.4 10*3/uL (ref 0.1–1.0)
Monocytes Relative: 9 %
Neutro Abs: 2.1 10*3/uL (ref 1.7–7.7)
Neutrophils Relative %: 43 %
Platelets: 217 10*3/uL (ref 150–400)
RBC: 3.1 MIL/uL — ABNORMAL LOW (ref 3.87–5.11)
RDW: 12.5 % (ref 11.5–15.5)
WBC: 4.8 10*3/uL (ref 4.0–10.5)
nRBC: 0 % (ref 0.0–0.2)

## 2021-03-08 LAB — QUANTIFERON-TB GOLD PLUS (RQFGPL)
QuantiFERON Mitogen Value: 2.15 IU/mL
QuantiFERON Nil Value: 0.1 IU/mL
QuantiFERON TB1 Ag Value: 0.08 IU/mL
QuantiFERON TB2 Ag Value: 0.06 IU/mL

## 2021-03-08 LAB — PHOSPHORUS: Phosphorus: 4 mg/dL (ref 2.5–4.6)

## 2021-03-08 LAB — QUANTIFERON-TB GOLD PLUS: QuantiFERON-TB Gold Plus: NEGATIVE

## 2021-03-08 LAB — MISC LABCORP TEST (SEND OUT): Labcorp test code: 70115

## 2021-03-08 LAB — COMPREHENSIVE METABOLIC PANEL
ALT: 11 U/L (ref 0–44)
AST: 16 U/L (ref 15–41)
Albumin: 2.6 g/dL — ABNORMAL LOW (ref 3.5–5.0)
Alkaline Phosphatase: 78 U/L (ref 38–126)
Anion gap: 5 (ref 5–15)
BUN: 17 mg/dL (ref 6–20)
CO2: 26 mmol/L (ref 22–32)
Calcium: 8.6 mg/dL — ABNORMAL LOW (ref 8.9–10.3)
Chloride: 107 mmol/L (ref 98–111)
Creatinine, Ser: 1.16 mg/dL — ABNORMAL HIGH (ref 0.44–1.00)
GFR, Estimated: 58 mL/min — ABNORMAL LOW (ref >60)
Glucose, Bld: 111 mg/dL — ABNORMAL HIGH (ref 70–99)
Potassium: 4.2 mmol/L (ref 3.5–5.1)
Sodium: 138 mmol/L (ref 135–145)
Total Bilirubin: 0.3 mg/dL (ref 0.3–1.2)
Total Protein: 6.2 g/dL — ABNORMAL LOW (ref 6.5–8.1)

## 2021-03-08 LAB — MAGNESIUM: Magnesium: 2 mg/dL (ref 1.7–2.4)

## 2021-03-08 LAB — GLUCOSE, CAPILLARY
Glucose-Capillary: 98 mg/dL (ref 70–99)
Glucose-Capillary: 172 mg/dL — ABNORMAL HIGH (ref 70–99)
Glucose-Capillary: 170 mg/dL — ABNORMAL HIGH (ref 70–99)
Glucose-Capillary: 169 mg/dL — ABNORMAL HIGH (ref 70–99)

## 2021-03-08 LAB — VDRL, CSF: VDRL Quant, CSF: NONREACTIVE

## 2021-03-08 MED ORDER — ADULT MULTIVITAMIN W/MINERALS CH
1.0000 | ORAL_TABLET | Freq: Every day | ORAL | Status: DC
  Administered 2021-03-08 – 2021-03-17 (×10): 1 via ORAL
  Filled 2021-03-08 (×10): qty 1

## 2021-03-08 MED ORDER — ENSURE MAX PROTEIN PO LIQD
11.0000 [oz_av] | Freq: Every day | ORAL | Status: DC
  Administered 2021-03-08 – 2021-03-14 (×4): 11 [oz_av] via ORAL
  Filled 2021-03-08 (×8): qty 330

## 2021-03-08 MED ORDER — GLUCERNA SHAKE PO LIQD
237.0000 mL | ORAL | Status: DC
  Administered 2021-03-09 – 2021-03-15 (×4): 237 mL via ORAL
  Filled 2021-03-08 (×7): qty 237

## 2021-03-08 MED ORDER — PROSOURCE PLUS PO LIQD
30.0000 mL | Freq: Every day | ORAL | Status: DC
Start: 2021-03-08 — End: 2021-03-14
  Administered 2021-03-08 – 2021-03-13 (×5): 30 mL via ORAL
  Filled 2021-03-08 (×5): qty 30

## 2021-03-08 NOTE — Progress Notes (Signed)
Initial Nutrition Assessment  DOCUMENTATION CODES:   Not applicable  INTERVENTION:  - will order Glucerna Shake once/day, each supplement provides 220 kcal and 10 grams of protein. - will order Ensure Max once/day, each supplement provides 350 kcal and 20 grams of protein. - will order 30 ml Prosource Plus once/day, each supplement provides 100 kcal and 15 grams protein.  - will order 1 tablet multivitamin with minerals/day. - complete NFPE when feasible.    NUTRITION DIAGNOSIS:   Increased nutrient needs related to acute illness as evidenced by estimated needs.  GOAL:   Patient will meet greater than or equal to 90% of their needs  MONITOR:   PO intake, Supplement acceptance, Labs, Weight trends, I & O's  REASON FOR ASSESSMENT:   Malnutrition Screening Tool    ASSESSMENT:   50 year old female with medical history of afib, left eye visual impairment, HTN, HLD, type 2 DM, iron deficiency anemia, and depression. She was admitted at [facility] 11/6-11/14 for multifocal CVAs. She presented to the ED on 11/15 due to rectal bleeding. In the ED, CT abdomen/pelvis showed concern for colitis and proctitis.  Unable to assess patient at time of attempted visit. Diet advanced from CLD to [place] yesterday at 30 and to Soft today at 1255.   Documented intakes were 100% of lunch and 75% of dinner yesterday (on FLD).   She was assessed by a RD at [facility] on 11/8 at which time patient was very fatigued and did not awake to voice or touch. No family had been present at that time.   Weight on 11/15 was 151 lb and weight on 02/09/21 was 153 lb and weight on 01/04/21 was 156 lb. This indicates 5 lb weight loss (3.2% body weight) in the past 2 months.   Per notes: - proctitis/colitis with GIB - prior CVA - uncontrolled type 2 DM with HgbA1c of 12.1% - medication non-compliance, polypharmacy   Labs reviewed; CBGs: 98 and 172 mg/dl, creatinine: 1.16 mg/dl, Ca: 8.6 mg/dl, GFR: 58 ml/min.    Medications reviewed; sliding scale novolog, 15 units levemir/day.    NUTRITION - FOCUSED PHYSICAL EXAM:  Unable to complete at this time.   Diet Order:   Diet Order             DIET SOFT Room service appropriate? Yes; Fluid consistency: Thin  Diet effective now                   EDUCATION NEEDS:   Not appropriate for education at this time  Skin:  Skin Assessment: Reviewed RN Assessment  Last BM:  11/16  Height:   Ht Readings from Last 1 Encounters:  03/06/21 5\' 7"  (1.702 m)    Weight:   Wt Readings from Last 1 Encounters:  03/06/21 68.7 kg      Estimated Nutritional Needs:  Kcal:  2000-2200 kcal Protein:  100-115 grams Fluid:  >/= 2 L/day      Jarome Matin, MS, RD, LDN, CNSC Inpatient Clinical Dietitian RD pager # available in AMION  After hours/weekend pager # available in [facility]

## 2021-03-08 NOTE — Patient Outreach (Signed)
Care Coordination  03/08/2021  Jennifer [name] Oct 08, 1970 [id number]  [name] is currently admitted as an inpatient at [place] team will follow the progress of Jennifer [name] and follow up upon discharge.   [name] RN, BSN [place]  [facility] Managed Medicaid High Risk [phone].

## 2021-03-08 NOTE — Progress Notes (Signed)
PROGRESS NOTE    Jennifer [name]  ZYS:[id number] DOB: Aug 27, 1970 DOA: 03/06/2021 PCP: Jose Persia, MD  Brief Narrative:  The patient is a 50 year old Arabic female with a past medical history significant for but not limited to atrial fibrillation on anticoagulation with Eliquis, history of left eye visual impairment, hypertension, hyperlipidemia as well as diabetes mellitus type 2 who was recently admitted to [facility] from 02/25/2021 until 03/05/2021 for multifocal CVAs who now presents to the hospital for rectal bleeding.  She states that she is okay when she left the hospital the day before yesterday but when she got home her stomach was hurting and she complained about this in the hospital and told that it might be for medications.  He states that it started around 2:30 PM and she states that she started having 3-4 stools overnight and noticed blood in the stool by the end she had frank blood clots last time.  She was having left flank pain and her last dose of Eliquis was yesterday morning and did not take any medications last night.  Upon arrival to the ED she had 4 bloody stools and has a Foley catheter for retention.  She is heme positive and GI was consulted.  CT of the abdomen pelvis showed concern for colitis and proctitis so she is started on IV Rocephin and Flagyl.  Rectal bleeding has improved however hemoglobin is dropped a little bit but is relatively stable and slightly improved.  GI evaluated and recommending placing this patient on soft diet.  If she tolerates a soft diet without issues she can be discharged home however she is significantly weak so we will obtain a PT and OT to further evaluate.  We will also discontinue her Foley catheter to see if she voids on her own.  Anticipating discharging home in next 24 to 48 hours.  Assessment & Plan:   Principal Problem:   Acute proctitis Active Problems:   Type II diabetes mellitus (HCC)   Atrial fibrillation (HCC)   Blind  left eye   CKD (chronic kidney disease) stage 3, GFR 30-59 ml/min (HCC)   Brain lesion   Acute lower GI bleeding   Glaucoma   Acute urinary retention   At risk for medication noncompliance   Proctitis/Colitis with GI bleeding -She was hospitalized from 02/25/21 until 03/05/2021 and discharged yesterday -She developed abdominal pain prior to discharge and then had recurrent bloody stools overnight -CT scan showed "Prominent edematous wall thickening in the rectum with perirectal and presacral edema. Imaging features suggest infectious/inflammatory proctitis. There is probably some associated wall thickening in the site distal sigmoid colon in features may reflect a left-sided infectious/inflammatory colitis. Foley catheter decompresses the urinary bladder. Gas in the bladder lumen is compatible with the instrumentation." -She is admitted for ongoing management of her abdominal pain and bloody bowel movement have improved -She was initiated on IV Cipro Flagyl and will continue and GI recommends 10-day total course of Cipro Flagyl for possible infectious proctocolitis -Hemoglobin has dropped and now went from 10.2/32.6 -> 9.0/31.4 and is now 8.2/26.4 -Continue to hold Eliquis for now for 5 days total from a neurological standpoint -She is initiated on a clear liquid diet for now and will need further diet advancement per GI and she was advanced to a soft diet today -GI feels that this is a difficult case given her recent CVA and they recommended her for bleeding so long they would be reluctant to do a colonoscopy on the other hand  if her worsens they can do a sigmoidoscopy versus minimal or no sedation. -GI recommends holding Eliquis for the next 5 days if at all possible but if felt to be a high risk from stroke recurrence standpoint consider IV heparin and see how she does however I spoke with Dr. Leonel Ramsay who recommends no anticoagulation given her recent bleeding -She was started on IVF with  LR at 100 mL/hr and will reduce to 75 mL/hr for 12 more hours; IV fluid has now stopped -C/w Supportive Care with Antiemtics with Ondansetron 4 mg po/IV q6hprn Nausea -Nutritionist has been consulted and recommending Glucerna shake once a day as well as Ensure max once a day as well as 30 mL of Prosource plus once a day and multivitamin with minerals daily -Discussed with neurology Dr. Leonel Ramsay who recommends that the risks of anticoagulation outweigh the benefits currently and recommends resuming once okay from a GI standpoint.   Prior CVA/abnormal MRI -She was seen by neurology during the prior hospitalization -She is scheduled for repeat imaging in 4 weeks with plan for close neurology outpatient follow-up -There is concern for severely uncontrolled vascular factors noted and appears that this may be her primary issue -Unfortunately we will continue to hold Eliquis for now given her GI bleeding and GI recommends for 5 days; may consider resuming anticoagulation with heparin drip but will hold off and resume anticoagulation in outpatient setting -Discussed with neurology -We will obtain PT OT to further evaluate and treat given that she is significantly weak  Atrial fibrillation -She was previously on metoprolol but this was held during last admission due to soft blood pressure and she is to restart slowly -Currently holding anticoagulation of the cause given her GI bleeding and recommended resuming in 5 days  Uncontrolled diabetes mellitus type 2 with hyperglycemia -Globin AC was 12.1 indicating very poor control -She is initiated on Levemir which we will continue at 12 units  -We will continue to hold Tradjenta and metformin -Continue with Moderate NovoLog/scale insulin before meals and at bedtime -She is currently taking gabapentin 300 mg p.o. nightly -CBGs ranging from 59-1 72  HTN -Continue Amlodipine 2.5 mg pO BID -Continue to Monitor BP per Protocol -C/w Hydralazine 5 mg IV  q4hprn High Blood Pressure  -Last BP reading was 110/64  Stage 3a CKD -She had an AKI during her prior hospitalization which is now resolved -Creatinine appears to be her baseline yesterday her BUN/creatinine was 8/0.19 and today is 17/1.16 -Getting IVF as above and have now stopped -Avoid nephrotoxic medications, contrast dyes, hypotension and dehydration and renally dose medications -Continue to monitor and trend renal function repeat CMP in a.m.  Glaucoma -She has Left Eye Blindness -Followed by Dr. Manuella Ghazi at [facility] -C/w Brimonidine 1 drop Left eye TID and Dorzolamide-Timolol 22.3-6.8 1 drop Left Eye BID  Urinary Retention  -Has an indwelling Foley with Plan for Outpatient Urology F/U within 1 week to follow-up with him in tomorrow.  We will discontinue Foley catheter for now and see if she can void on her own given that she was having some vaginal discomfort -Urine Cx Grew 80 Yeast and she was started on 14 Days of Diflucan 200 mg po Daily    Medication Noncompliance  -Her daughter noted confusion about which meds she is supposed to be taking, polypharmacy -Suggest close ongoing discussion and education regarding importance of medications during and after hospitalization -Suggest specific instructions given her language barrier and possible low medical literacy   DVT  prophylaxis: SCDs Code Status: FULL CODE  Family Communication: Discussed with family at bedside Disposition Plan: Pending GI Clearance and tolerance of diet as well as evaluation by PT and OT.  Status is: Inpatient  Remains inpatient appropriate because: She needs GI clearance prior to safe discharge disposition  Consultants:  Gastroenterology   Procedures:  None  Antimicrobials: Anti-infectives (From admission, onward)    Start     Dose/Rate Route Frequency Ordered Stop   03/07/21 1000  cefTRIAXone (ROCEPHIN) 2 g in sodium chloride 0.9 % 100 mL IVPB        2 g 200 mL/hr over 30 Minutes Intravenous  Every 24 hours 03/06/21 1314     03/06/21 2200  metroNIDAZOLE (FLAGYL) IVPB 500 mg        500 mg 100 mL/hr over 60 Minutes Intravenous Every 12 hours 03/06/21 1314     03/06/21 1445  fluconazole (DIFLUCAN) tablet 200 mg        200 mg Oral Daily 03/06/21 1314     03/06/21 1030  metroNIDAZOLE (FLAGYL) IVPB 500 mg        500 mg 100 mL/hr over 60 Minutes Intravenous  Once 03/06/21 1021 03/06/21 1226   03/06/21 1030  cefTRIAXone (ROCEPHIN) 1 g in sodium chloride 0.9 % 100 mL IVPB        1 g 200 mL/hr over 30 Minutes Intravenous  Once 03/06/21 1021 03/06/21 1226        Subjective: Seen and examined at bedside with the assistance of the translator Quincy Simmonds #[id number] and the patient states that she is doing much better and had a bowel movement yesterday with no blood in it.  She denies any abdominal pain.  Was complaining of Foley catheter pain.  No nausea or vomiting.  Felt well however was significantly weak and so we will obtain PT OT to evaluate and treat.  GI evaluated and felt that she could be advanced on her diet given that they are not planning on any endoscopic intervention at this time.  Objective: Vitals:   03/07/21 2033 03/07/21 2244 03/08/21 0459 03/08/21 1135  BP: (!) 143/54 (!) 153/73 (!) 124/59 (!) 141/63  Pulse: 80  76 87  Resp: 18  16 16   Temp: 98.5 F (36.9 C)  98.3 F (36.8 C) 98.4 F (36.9 C)  TempSrc: Oral  Oral Oral  SpO2: 100%  100% 100%  Weight:      Height:        Intake/Output Summary (Last 24 hours) at 03/08/2021 1536 Last data filed at 03/08/2021 1132 Gross per 24 hour  Intake 1578 ml  Output 1400 ml  Net 178 ml    Filed Weights   03/06/21 2100  Weight: 68.7 kg   Examination: Physical Exam:  Constitutional: WN/WD Arabic female in no acute distress appears calm and comfortable Eyes: Eyes left eye blindness from glaucoma ENMT: External Ears, Nose appear normal. Grossly normal hearing. Mucous membranes are moist.  Neck: Appears normal, supple, no  cervical masses, normal ROM, no appreciable thyromegaly; no appreciable JVD Respiratory: Mildly diminished to auscultation bilaterally, no wheezing, rales, rhonchi or crackles. Normal respiratory effort and patient is not tachypenic. No accessory muscle use.  Unlabored breathing Cardiovascular: RRR, no murmurs / rubs / gallops. S1 and S2 auscultated. No extremity edema. Abdomen: Soft, non-tender, nondistended. Bowel sounds positive.  GU: Deferred.  Foley catheter is in place draining clear yellow urine Musculoskeletal: No clubbing / cyanosis of digits/nails. No joint deformity upper and lower extremities. Skin: No  rashes, lesions, ulcers. No induration; Warm and dry.  Neurologic: CN 2-12 grossly intact with no focal deficits. Romberg sign and cerebellar reflexes not assessed.  Psychiatric: Normal judgment and insight. Alert and oriented x 3. Normal mood and appropriate affect.   Data Reviewed: I have personally reviewed following labs and imaging studies  CBC: Recent Labs  Lab 03/06/21 0601 03/06/21 1700 03/07/21 0446 03/07/21 1743 03/08/21 0435  WBC 6.1 5.3 5.0 5.6 4.8  NEUTROABS 4.1  --   --   --  2.1  HGB 10.2* 9.0* 8.2* 8.4* 8.6*  HCT 32.6* 31.4* 26.4* 27.6* 27.7*  MCV 89.1 94.9 89.2 89.0 89.4  PLT 253 257 231 248 403    Basic Metabolic Panel: Recent Labs  Lab 03/02/21 0329 03/03/21 0301 03/04/21 0847 03/05/21 0809 03/06/21 0601 03/07/21 0446 03/08/21 0435  NA 136 137 137 138 140 141 138  K 4.5 3.7 4.0 4.2 4.7 4.0 4.2  CL 103 104 103 104 106 109 107  CO2 26 26 25 25 24 27 26   GLUCOSE 122* 119* 205* 147* 181* 83 111*  BUN 61* 37* 25* 20 22* 18 17  CREATININE 3.09* 1.49* 1.29* 1.12* 1.15* 1.09* 1.16*  CALCIUM 9.5 9.4 8.9 8.9 9.4 8.7* 8.6*  MG 2.3  --   --   --   --   --  2.0  PHOS  --  4.1 4.3  --   --   --  4.0    GFR: Estimated Creatinine Clearance: 57 mL/min (A) (by C-G formula based on SCr of 1.16 mg/dL (H)). Liver Function Tests: Recent Labs  Lab  03/02/21 0329 03/03/21 0301 03/04/21 0847 03/06/21 0601 03/08/21 0435  AST 41  --   --  17 16  ALT 32  --   --  18 11  ALKPHOS 106  --   --  107 78  BILITOT 0.3  --   --  0.4 0.3  PROT 5.7*  --   --  7.5 6.2*  ALBUMIN 2.4* 2.4* 2.2* 3.1* 2.6*    No results for input(s): LIPASE, AMYLASE in the last 168 hours. No results for input(s): AMMONIA in the last 168 hours.  Coagulation Profile: Recent Labs  Lab 03/06/21 0601  INR 1.1    Cardiac Enzymes: No results for input(s): CKTOTAL, CKMB, CKMBINDEX, TROPONINI in the last 168 hours.  BNP (last 3 results) No results for input(s): PROBNP in the last 8760 hours. HbA1C: No results for input(s): HGBA1C in the last 72 hours. CBG: Recent Labs  Lab 03/07/21 1716 03/07/21 1729 03/07/21 2109 03/08/21 0740 03/08/21 1132  GLUCAP 59* 96 242* 98 172*    Lipid Profile: No results for input(s): CHOL, HDL, LDLCALC, TRIG, CHOLHDL, LDLDIRECT in the last 72 hours. Thyroid Function Tests: No results for input(s): TSH, T4TOTAL, FREET4, T3FREE, THYROIDAB in the last 72 hours. Anemia Panel: No results for input(s): VITAMINB12, FOLATE, FERRITIN, TIBC, IRON, RETICCTPCT in the last 72 hours. Sepsis Labs: No results for input(s): PROCALCITON, LATICACIDVEN in the last 168 hours.  Recent Results (from the past 240 hour(s))  Culture, blood (routine x 2)     Status: None   Collection Time: 02/27/21 12:06 PM   Specimen: BLOOD  Result Value Ref Range Status   Specimen Description BLOOD LEFT ANTECUBITAL  Final   Special Requests   Final    BOTTLES DRAWN AEROBIC AND ANAEROBIC Blood Culture adequate volume   Culture   Final    NO GROWTH 5 DAYS Performed at [facility], [place] [address], [place], [place] [address]    Report Status 03/04/2021 FINAL  Final  Culture, blood (routine x 2)     Status: None   Collection Time: 02/27/21 12:13 PM   Specimen: BLOOD LEFT HAND  Result Value Ref Range Status   Specimen Description BLOOD LEFT HAND   Final   Special Requests   Final    BOTTLES DRAWN AEROBIC AND ANAEROBIC Blood Culture adequate volume   Culture   Final    NO GROWTH 5 DAYS Performed at [facility], [place] [address], [place], [place] [address]    Report Status 03/04/2021 FINAL  Final  Surgical pcr screen     Status: None   Collection Time: 03/01/21  7:38 AM   Specimen: Nasal Mucosa; Nasal Swab  Result Value Ref Range Status   MRSA, PCR NEGATIVE NEGATIVE Final   Staphylococcus aureus NEGATIVE NEGATIVE Final    Comment: (NOTE) The Xpert SA Assay (FDA approved for NASAL specimens in patients 20 years of age and older), is one component of a comprehensive surveillance program. It is not intended to diagnose infection nor to guide or monitor treatment. Performed at [facility], [place] [address] [place], [place] [address]   Urine Culture     Status: Abnormal   Collection Time: 03/01/21 12:05 PM   Specimen: Urine, Catheterized  Result Value Ref Range Status   Specimen Description URINE, CATHETERIZED  Final   Special Requests   Final    NONE Performed at [facility], [place] [address] [place], [place] [address]    Culture 80,000 COLONIES/mL YEAST (A)  Final   Report Status 03/02/2021 FINAL  Final  Fungus culture, blood     Status: None (Preliminary result)   Collection Time: 03/01/21  2:26 PM   Specimen: BLOOD  Result Value Ref Range Status   Specimen Description BLOOD RIGHT ANTECUBITAL  Final   Special Requests   Final    BOTTLES DRAWN AEROBIC ONLY Blood Culture adequate volume   Culture   Final    NO GROWTH 6 DAYS Performed at [facility], [place] [address], [place], [place] [address]    Report Status PENDING  Incomplete  CSF culture w Gram Stain     Status: None   Collection Time: 03/01/21  4:50 PM   Specimen: CSF; Cerebrospinal Fluid  Result Value Ref Range Status   Specimen Description CSF  Final   Special Requests NONE  Final   Gram Stain   Final    WBC PRESENT, PREDOMINANTLY  MONONUCLEAR NO ORGANISMS SEEN CYTOSPIN SMEAR    Culture   Final    NO GROWTH 3 DAYS Performed at [facility], [place] [address] [place], [place] [address]    Report Status 03/05/2021 FINAL  Final  Fungus Culture With Stain     Status: None (Preliminary result)   Collection Time: 03/01/21  4:50 PM  Result Value Ref Range Status   Fungus Stain Final report  Final    Comment: (NOTE) Performed At: [facility] [place], [place] [id number] Rush Farmer MD QZ:[id number]    Fungus (Mycology) Culture PENDING  Incomplete   Fungal Source CSF  Final    Comment: Performed at [facility], [place] [address], [place], [place] [address]  Acid Fast Smear (AFB)     Status: None   Collection Time: 03/01/21  4:50 PM   Specimen: Cerebrospinal Fluid  Result  Value Ref Range Status   AFB Specimen Processing Concentration  Final   Acid Fast Smear Negative  Final    Comment: (NOTE) Performed At: [facility] [place], [place] [id number] Rush Farmer MD [id number]    Source (AFB) CSF  Final    Comment: Performed at [facility], [place] [address], [place], [place] [address]  Fungus Culture Result     Status: None   Collection Time: 03/01/21  4:50 PM  Result Value Ref Range Status   Result 1 Comment  Final    Comment: (NOTE) KOH/Calcofluor preparation:  no fungus observed. Performed At: [facility] [place], [place] [id number] Rush Farmer MD [id number]   Andee Poles, PCR     Status: None   Collection Time: 03/01/21  8:26 PM  Result Value Ref Range Status   Toxoplasma Gondii, PCR Negative Negative Final    Comment: (NOTE) No Toxoplasma gondii DNA detected. This test was developed and its performance characteristics determined by [employer]. It has not been cleared or approved by the U.S. Food and Drug Administration. The FDA has determined that such clearance or approval is not  necessary. This test is used for clinical purposes. It should not be regarded as investigational or research. Performed At: [facility] [place], [place] [id number] Rush Farmer MD [id number]   Aspergillus Ag, BAL/Serum     Status: None   Collection Time: 03/01/21  8:26 PM  Result Value Ref Range Status   Aspergillus Ag, BAL/Serum 0.04 0.00 - 0.49 Index Final    Comment: (NOTE) Performed At: [facility] [place], [place] [id number] Rush Farmer MD [id number]   Resp Panel by RT-PCR (Flu A&B, Covid) Nasopharyngeal Swab     Status: None   Collection Time: 03/06/21 11:58 AM   Specimen: Nasopharyngeal Swab; Nasopharyngeal(NP) swabs in vial transport medium  Result Value Ref Range Status   SARS Coronavirus 2 by RT PCR NEGATIVE NEGATIVE Final    Comment: (NOTE) SARS-CoV-2 target nucleic acids are NOT DETECTED.  The SARS-CoV-2 RNA is generally detectable in upper respiratory specimens during the acute phase of infection. The lowest concentration of SARS-CoV-2 viral copies this assay can detect is 138 copies/mL. A negative result does not preclude SARS-Cov-2 infection and should not be used as the sole basis for treatment or other patient management decisions. A negative result may occur with  improper specimen collection/handling, submission of specimen other than nasopharyngeal swab, presence of viral mutation(s) within the areas targeted by this assay, and inadequate number of viral copies(<138 copies/mL). A negative result must be combined with clinical observations, patient history, and epidemiological information. The expected result is Negative.  Fact Sheet for Patients:  EntrepreneurPulse.com.au  Fact Sheet for Healthcare Providers:  IncredibleEmployment.be  This test is no t yet approved or cleared by the [place] FDA and  has been authorized for detection and/or diagnosis of  SARS-CoV-2 by FDA under an Emergency Use Authorization (EUA). This EUA will remain  in effect (meaning this test can be used) for the duration of the COVID-19 declaration under Section 564(b)(1) of the Act, 21 U.S.C.section 360bbb-3(b)(1), unless the authorization is terminated  or revoked sooner.       Influenza A by PCR NEGATIVE NEGATIVE Final   Influenza B by PCR NEGATIVE NEGATIVE Final    Comment: (NOTE) The Xpert Xpress SARS-CoV-2/FLU/RSV plus assay is intended as an aid in the diagnosis of influenza from Nasopharyngeal swab specimens and  should not be used as a sole basis for treatment. Nasal washings and aspirates are unacceptable for Xpert Xpress SARS-CoV-2/FLU/RSV testing.  Fact Sheet for Patients: EntrepreneurPulse.com.au  Fact Sheet for Healthcare Providers: IncredibleEmployment.be  This test is not yet approved or cleared by the [place] FDA and has been authorized for detection and/or diagnosis of SARS-CoV-2 by FDA under an Emergency Use Authorization (EUA). This EUA will remain in effect (meaning this test can be used) for the duration of the COVID-19 declaration under Section 564(b)(1) of the Act, 21 U.S.C. section 360bbb-3(b)(1), unless the authorization is terminated or revoked.  Performed at [facility], [place] [address], [place], [place] [address]      RN Pressure Injury Documentation:     Estimated body mass index is 23.72 kg/m as calculated from the following:   Height as of this encounter: 5\' 7"  (1.702 m).   Weight as of this encounter: 68.7 kg.  Malnutrition Type: Nutrition Problem: Increased nutrient needs Etiology: acute illness Malnutrition Characteristics: Signs/Symptoms: estimated needs Nutrition Interventions: Interventions: Glucerna shake, Premier Protein, Prostat, MVI  Radiology Studies: No results found.  Scheduled Meds:  (feeding supplement) PROSource Plus  30 mL Oral  Daily   amLODipine  2.5 mg Oral BID   brimonidine  1 drop Left Eye TID   Chlorhexidine Gluconate Cloth  6 each Topical Daily   dorzolamide-timolol  1 drop Left Eye BID   [START ON 03/09/2021] feeding supplement (GLUCERNA SHAKE)  237 mL Oral Q24H   fluconazole  200 mg Oral Daily   gabapentin  300 mg Oral QHS   insulin aspart  0-15 Units Subcutaneous TID WC   insulin aspart  0-5 Units Subcutaneous QHS   insulin detemir  15 Units Subcutaneous QHS   multivitamin with minerals  1 tablet Oral Daily   Ensure Max Protein  11 oz Oral Daily   sodium chloride flush  3 mL Intravenous Q12H   Continuous Infusions:  cefTRIAXone (ROCEPHIN)  IV 2 g (03/08/21 1142)   metronidazole 500 mg (03/08/21 1221)    LOS: 2 days   Kerney Elbe, DO [facility] PAGER is on AMION  If 7PM-7AM, please contact night-coverage [email or web address]

## 2021-03-08 NOTE — Progress Notes (Signed)
[facility] Progress Note  Jennifer [name] 50 y.o. Dec 19, 1970  CC:  Rectal bleeding   Subjective: Patient states she is doing well. Denies abdominal pain, nausea, vomiting. Has not had a BM recently. Last BM was 11/16 in the morning and had a very small amount of bright red blood.   ROS : Review of Systems  Gastrointestinal:  Positive for blood in stool. Negative for abdominal pain, constipation, diarrhea, heartburn, melena, nausea and vomiting.  Genitourinary:  Negative for dysuria and urgency.     Objective: Vital signs in last 24 hours: Vitals:   03/07/21 2244 03/08/21 0459  BP: (!) 153/73 (!) 124/59  Pulse:  76  Resp:  16  Temp:  98.3 F (36.8 C)  SpO2:  100%    Physical Exam:  General:  Alert, cooperative, no distress, appears stated age. Family at bedisde  Head:  Normocephalic, without obvious abnormality, atraumatic  Eyes:  Anicteric sclera, EOM's intact  Lungs:   Clear to auscultation bilaterally, respirations unlabored  Heart:  Regular rate and rhythm, S1, S2 normal  Abdomen:   Soft, non-tender, bowel sounds active all four quadrants,  no masses,     Lab Results: Recent Labs    03/07/21 0446 03/08/21 0435  NA 141 138  K 4.0 4.2  CL 109 107  CO2 27 26  GLUCOSE 83 111*  BUN 18 17  CREATININE 1.09* 1.16*  CALCIUM 8.7* 8.6*  MG  --  2.0  PHOS  --  4.0   Recent Labs    03/06/21 0601 03/08/21 0435  AST 17 16  ALT 18 11  ALKPHOS 107 78  BILITOT 0.4 0.3  PROT 7.5 6.2*  ALBUMIN 3.1* 2.6*   Recent Labs    03/06/21 0601 03/06/21 1700 03/07/21 1743 03/08/21 0435  WBC 6.1   < > 5.6 4.8  NEUTROABS 4.1  --   --  2.1  HGB 10.2*   < > 8.4* 8.6*  HCT 32.6*   < > 27.6* 27.7*  MCV 89.1   < > 89.0 89.4  PLT 253   < > 248 217   < > = values in this interval not displayed.   Recent Labs    03/06/21 0601  LABPROT 14.6  INR 1.1      Assessment Proctitis/colitis with GI bleeding - CT abdomen/Pelvis w contrast 11/15: edematous wall  thickening in rectum with perirectal and presacral edema. Suggest infectious/inflammatory proctitis. Wall thickening in distal sigmoid which may reflect left-sided infectious/inflammatory colitis. - WBC 4.8 - HGB 8.6 (8.4 yesterday) - BUN 17, Cr. 1.16 - currently on Cipro/flagyl   Afib   DM   HTN   Plan: No more episodes of bleeding.  Reluctant to do colonoscopy due to recent stroke and comorbidities. If worsening bleeding/recurrence can consider sigmoidoscopy. But with no significant bleeding, it is not warranted at this time. Continue supportive care [facility] will follow.  Mayo Owczarzak Radford Pax PA-C 03/08/2021, 10:16 AM  Contact #  [phone]

## 2021-03-09 ENCOUNTER — Encounter: Payer: Self-pay | Admitting: Internal Medicine

## 2021-03-09 LAB — RESP PANEL BY RT-PCR (FLU A&B, COVID) ARPGX2
Influenza A by PCR: NEGATIVE
Influenza B by PCR: NEGATIVE
SARS Coronavirus 2 by RT PCR: NEGATIVE

## 2021-03-09 LAB — COMPREHENSIVE METABOLIC PANEL
ALT: 13 U/L (ref 0–44)
AST: 17 U/L (ref 15–41)
Albumin: 2.7 g/dL — ABNORMAL LOW (ref 3.5–5.0)
Alkaline Phosphatase: 89 U/L (ref 38–126)
Anion gap: 4 — ABNORMAL LOW (ref 5–15)
BUN: 17 mg/dL (ref 6–20)
CO2: 26 mmol/L (ref 22–32)
Calcium: 8.4 mg/dL — ABNORMAL LOW (ref 8.9–10.3)
Chloride: 110 mmol/L (ref 98–111)
Creatinine, Ser: 0.95 mg/dL (ref 0.44–1.00)
GFR, Estimated: >60 mL/min (ref >60)
Glucose, Bld: 157 mg/dL — ABNORMAL HIGH (ref 70–99)
Potassium: 4 mmol/L (ref 3.5–5.1)
Sodium: 140 mmol/L (ref 135–145)
Total Bilirubin: 0.1 mg/dL — ABNORMAL LOW (ref 0.3–1.2)
Total Protein: 5.9 g/dL — ABNORMAL LOW (ref 6.5–8.1)

## 2021-03-09 LAB — GLUCOSE, CAPILLARY
Glucose-Capillary: 93 mg/dL (ref 70–99)
Glucose-Capillary: 185 mg/dL — ABNORMAL HIGH (ref 70–99)
Glucose-Capillary: 161 mg/dL — ABNORMAL HIGH (ref 70–99)
Glucose-Capillary: 208 mg/dL — ABNORMAL HIGH (ref 70–99)
Glucose-Capillary: 212 mg/dL — ABNORMAL HIGH (ref 70–99)

## 2021-03-09 LAB — CBC WITH DIFFERENTIAL/PLATELET
Abs Immature Granulocytes: 0.03 10*3/uL (ref 0.00–0.07)
Basophils Absolute: 0 10*3/uL (ref 0.0–0.1)
Basophils Relative: 1 %
Eosinophils Absolute: 0.2 10*3/uL (ref 0.0–0.5)
Eosinophils Relative: 4 %
HCT: 26.8 % — ABNORMAL LOW (ref 36.0–46.0)
Hemoglobin: 8.2 g/dL — ABNORMAL LOW (ref 12.0–15.0)
Immature Granulocytes: 1 %
Lymphocytes Relative: 37 %
Lymphs Abs: 1.9 10*3/uL (ref 0.7–4.0)
MCH: 27.3 pg (ref 26.0–34.0)
MCHC: 30.6 g/dL (ref 30.0–36.0)
MCV: 89.3 fL (ref 80.0–100.0)
Monocytes Absolute: 0.4 10*3/uL (ref 0.1–1.0)
Monocytes Relative: 7 %
Neutro Abs: 2.6 10*3/uL (ref 1.7–7.7)
Neutrophils Relative %: 50 %
Platelets: 216 10*3/uL (ref 150–400)
RBC: 3 MIL/uL — ABNORMAL LOW (ref 3.87–5.11)
RDW: 12.1 % (ref 11.5–15.5)
WBC: 5.2 10*3/uL (ref 4.0–10.5)
nRBC: 0 % (ref 0.0–0.2)

## 2021-03-09 LAB — PHOSPHORUS: Phosphorus: 3.3 mg/dL (ref 2.5–4.6)

## 2021-03-09 LAB — MAGNESIUM: Magnesium: 2 mg/dL (ref 1.7–2.4)

## 2021-03-09 NOTE — Progress Notes (Signed)
PROGRESS NOTE    Jennifer [name]  KCL:[id number] DOB: 02-06-1971 DOA: 03/06/2021 PCP: Jose Persia, MD  Brief Narrative:  The patient is a 50 year old Arabic female with a past medical history significant for but not limited to atrial fibrillation on anticoagulation with Eliquis, history of left eye visual impairment, hypertension, hyperlipidemia as well as diabetes mellitus type 2 who was recently admitted to [facility] from 02/25/2021 until 03/05/2021 for multifocal CVAs who now presents to the hospital for rectal bleeding.  She states that she is okay when she left the hospital the day before yesterday but when she got home her stomach was hurting and she complained about this in the hospital and told that it might be for medications.  He states that it started around 2:30 PM and she states that she started having 3-4 stools overnight and noticed blood in the stool by the end she had frank blood clots last time.  She was having left flank pain and her last dose of Eliquis was yesterday morning and did not take any medications last night.  Upon arrival to the ED she had 4 bloody stools and has a Foley catheter for retention.  She is heme positive and GI was consulted.  CT of the abdomen pelvis showed concern for colitis and proctitis so she is started on IV Rocephin and Flagyl.  Rectal bleeding has improved however hemoglobin is dropped a little bit but is relatively stable and slightly improved.  GI evaluated and recommending placing this patient on soft diet.  If she tolerates a soft diet without issues she can be discharged home however she is significantly weak so we will obtain a PT and OT to further evaluate and they are recommending CIR admission.  We have discontinued her Foley and will be checking for residuals . Assessment & Plan:   Principal Problem:   Acute proctitis Active Problems:   Type II diabetes mellitus (HCC)   Atrial fibrillation (HCC)   Blind left eye   CKD (chronic  kidney disease) stage 3, GFR 30-59 ml/min (HCC)   Brain lesion   Acute lower GI bleeding   Glaucoma   Acute urinary retention   At risk for medication noncompliance   Proctitis/Colitis with GI bleeding, improved  -She was hospitalized from 02/25/21 until 03/05/2021 and discharged yesterday -She developed abdominal pain prior to discharge and then had recurrent bloody stools overnight -CT scan showed "Prominent edematous wall thickening in the rectum with perirectal and presacral edema. Imaging features suggest infectious/inflammatory proctitis. There is probably some associated wall thickening in the site distal sigmoid colon in features may reflect a left-sided infectious/inflammatory colitis. Foley catheter decompresses the urinary bladder. Gas in the bladder lumen is compatible with the instrumentation." -She is admitted for ongoing management of her abdominal pain and bloody bowel movement have improved -She was initiated on IV Cipro Flagyl and will continue and GI recommends 10-day total course of Cipro Flagyl for possible infectious proctocolitis -Hemoglobin has dropped and now went from 10.2/32.6 -> 9.0/31.4  -> 8.2/26.4 -> 8.4/27.6 -> 8.6/27.7 -> 8.2/26.8 -Continue to hold Eliquis for now for 5 days total from a neurological standpoint -She is initiated on a clear liquid diet for now and will need further diet advancement per GI and she was advanced to a soft diet today -GI feels that this is a difficult case given her recent CVA and they recommended her for bleeding so long they would be reluctant to do a colonoscopy on the other hand if  her worsens they can do a sigmoidoscopy versus minimal or no sedation. -GI recommends holding Eliquis for the next 5 days if at all possible but if felt to be a high risk from stroke recurrence standpoint consider IV heparin and see how she does however I spoke with Dr. Leonel Ramsay who recommends no anticoagulation given her recent bleeding -IVF -C/w  Supportive Care with Antiemtics with Ondansetron 4 mg po/IV q6hprn Nausea -Nutritionist has been consulted and recommending Glucerna shake once a day as well as Ensure max once a day as well as 30 mL of Prosource plus once a day and multivitamin with minerals daily -Discussed with neurology Dr. Leonel Ramsay who recommends that the risks of anticoagulation outweigh the benefits currently and recommends resuming once okay from a GI standpoint.   Prior CVA/abnormal MRI with ? Brain Metastases  -She was seen by neurology during the prior hospitalization -She is scheduled for repeat imaging in 4 weeks with plan for close neurology outpatient follow-up -There is concern for severely uncontrolled vascular factors noted and appears that this may be her primary issue -Unfortunately we will continue to hold Eliquis for now given her GI bleeding and GI recommends for 5 days; may consider resuming anticoagulation with heparin drip but will hold off and resume anticoagulation in outpatient setting -Discussed with neurology -We will obtain PT OT to further evaluate and treat given that she is significantly weak and they are recommending CIR; Rehab Admissions Coordinator consulted for further evaluation   Atrial fibrillation -She was previously on metoprolol but this was held during last admission due to soft blood pressure and she is to restart slowly -Currently holding anticoagulation of the cause given her GI bleeding and recommended resuming in 5 days (2 days left)   Uncontrolled diabetes mellitus type 2 with hyperglycemia -Globin AC was 12.1 indicating very poor control -She is initiated on Levemir which we will continue at 12 units  -We will continue to hold Tradjenta and metformin -Continue with Moderate NovoLog/scale insulin before meals and at bedtime -She is currently taking gabapentin 300 mg p.o. nightly -CBGs ranging from 93-185  HTN -Continue Amlodipine 2.5 mg pO BID -Continue to Monitor BP  per Protocol -C/w Hydralazine 5 mg IV q4hprn High Blood Pressure  -Last BP reading was 150/79  Stage 3a CKD -She had an AKI during her prior hospitalization which is now resolved -Creatinine appears to be her baseline yesterday her BUN/creatinine was 8/0.19 and today is 17/1.16 -Getting IVF as above and have now stopped -Avoid nephrotoxic medications, contrast dyes, hypotension and dehydration and renally dose medications -Continue to monitor and trend renal function repeat CMP in a.m.  Glaucoma -She has Left Eye Blindness -Followed by [facility]. Manuella Ghazi at [facility] -C/w Brimonidine 1 drop Left eye TID and Dorzolamide-Timolol 22.3-6.8 1 drop Left Eye BID  Urinary Retention  -Has an indwelling Foley with Plan for Outpatient Urology F/U within 1 week to follow-up with him in tomorrow.  We will discontinue Foley catheter for now and see if she can void on her own given that she was having some vaginal discomfort -Urine Cx Grew 80 Yeast and she was started on 14 Days of Diflucan 200 mg po Daily    Medication Noncompliance  -Her daughter noted confusion about which meds she is supposed to be taking, polypharmacy -Suggest close ongoing discussion and education regarding importance of medications during and after hospitalization -Suggest specific instructions given her language barrier and possible low medical literacy   DVT prophylaxis: SCDs Code Status:  FULL CODE  Family Communication: Discussed with daughter at bedside Disposition Plan: Pending GI Clearance and tolerance of diet as well as evaluation by PT and OT.  Status is: Inpatient  Remains inpatient appropriate because: She needs GI clearance prior to safe discharge disposition  Consultants:  Gastroenterology   Procedures:  None  Antimicrobials: Anti-infectives (From admission, onward)    Start     Dose/Rate Route Frequency Ordered Stop   03/07/21 1000  cefTRIAXone (ROCEPHIN) 2 g in sodium chloride 0.9 % 100 mL IVPB        2  g 200 mL/hr over 30 Minutes Intravenous Every 24 hours 03/06/21 1314     03/06/21 2200  metroNIDAZOLE (FLAGYL) IVPB 500 mg        500 mg 100 mL/hr over 60 Minutes Intravenous Every 12 hours 03/06/21 1314     03/06/21 1445  fluconazole (DIFLUCAN) tablet 200 mg        200 mg Oral Daily 03/06/21 1314     03/06/21 1030  metroNIDAZOLE (FLAGYL) IVPB 500 mg        500 mg 100 mL/hr over 60 Minutes Intravenous  Once 03/06/21 1021 03/06/21 1226   03/06/21 1030  cefTRIAXone (ROCEPHIN) 1 g in sodium chloride 0.9 % 100 mL IVPB        1 g 200 mL/hr over 30 Minutes Intravenous  Once 03/06/21 1021 03/06/21 1226        Subjective: Seen and examined at bedside house can use the AMN translator however daughter did not want me to use it.  Patient thinks that she is doing much better and had no complaints.  Still feels weak and daughter asked questions about rehab.  No nausea or vomiting.  Has not had any more bloody bowel movements.  Feels okay.  No other concerns or complaints at this time.  Objective: Vitals:   03/08/21 1135 03/08/21 2012 03/09/21 0421 03/09/21 1127  BP: (!) 141/63 (!) 155/71 (!) 120/59 (!) 150/79  Pulse: 87 86 82 80  Resp: 16 16 16 18   Temp: 98.4 F (36.9 C) 98.7 F (37.1 C) 98.7 F (37.1 C) 98 F (36.7 C)  TempSrc: Oral Oral Oral Oral  SpO2: 100% 100% 99% 98%  Weight:      Height:        Intake/Output Summary (Last 24 hours) at 03/09/2021 1436 Last data filed at 03/09/2021 0200 Gross per 24 hour  Intake 540 ml  Output 1000 ml  Net -460 ml    Filed Weights   03/06/21 2100  Weight: 68.7 kg   Examination: Physical Exam:  Constitutional: WN/WD Arabic female in no acute distress appears calm and comfortable Eyes: Lids and conjunctivae normal, sclerae anicteric  ENMT: External Ears, Nose appear normal. Grossly normal hearing. Neck: Appears normal, supple, no cervical masses, normal ROM, no appreciable thyromegaly; no appreciable JVD Respiratory: Diminished to  auscultation bilaterally, no wheezing, rales, rhonchi or crackles. Normal respiratory effort and patient is not tachypenic. No accessory muscle use.  Unlabored breathing Cardiovascular: RRR, no murmurs / rubs / gallops. S1 and S2 auscultated. No extremity edema.  Abdomen: Soft, non-tender, non-distended. Bowel sounds positive.  GU: Deferred.  Foley catheter has been removed Musculoskeletal: No clubbing / cyanosis of digits/nails. No joint deformity upper and lower extremities.  Skin: No rashes, lesions, ulcers on limited skin evaluation. No induration; Warm and dry.  Neurologic: CN 2-12 grossly intact with no focal deficits. Romberg sign and cerebellar reflexes not assessed.  Psychiatric: Normal judgment and insight.  Alert and oriented x 3. Normal mood and appropriate affect.   Data Reviewed: I have personally reviewed following labs and imaging studies  CBC: Recent Labs  Lab 03/06/21 0601 03/06/21 1700 03/07/21 0446 03/07/21 1743 03/08/21 0435 03/09/21 0424  WBC 6.1 5.3 5.0 5.6 4.8 5.2  NEUTROABS 4.1  --   --   --  2.1 2.6  HGB 10.2* 9.0* 8.2* 8.4* 8.6* 8.2*  HCT 32.6* 31.4* 26.4* 27.6* 27.7* 26.8*  MCV 89.1 94.9 89.2 89.0 89.4 89.3  PLT 253 257 231 248 217 161    Basic Metabolic Panel: Recent Labs  Lab 03/03/21 0301 03/04/21 0847 03/05/21 0809 03/06/21 0601 03/07/21 0446 03/08/21 0435 03/09/21 0424  NA 137 137 138 140 141 138 140  K 3.7 4.0 4.2 4.7 4.0 4.2 4.0  CL 104 103 104 106 109 107 110  CO2 26 25 25 24 27 26 26   GLUCOSE 119* 205* 147* 181* 83 111* 157*  BUN 37* 25* 20 22* 18 17 17   CREATININE 1.49* 1.29* 1.12* 1.15* 1.09* 1.16* 0.95  CALCIUM 9.4 8.9 8.9 9.4 8.7* 8.6* 8.4*  MG  --   --   --   --   --  2.0 2.0  PHOS 4.1 4.3  --   --   --  4.0 3.3    GFR: Estimated Creatinine Clearance: 69.7 mL/min (by C-G formula based on SCr of 0.95 mg/dL). Liver Function Tests: Recent Labs  Lab 03/03/21 0301 03/04/21 0847 03/06/21 0601 03/08/21 0435 03/09/21 0424   AST  --   --  17 16 17   ALT  --   --  18 11 13   ALKPHOS  --   --  107 78 89  BILITOT  --   --  0.4 0.3 0.1*  PROT  --   --  7.5 6.2* 5.9*  ALBUMIN 2.4* 2.2* 3.1* 2.6* 2.7*    No results for input(s): LIPASE, AMYLASE in the last 168 hours. No results for input(s): AMMONIA in the last 168 hours.  Coagulation Profile: Recent Labs  Lab 03/06/21 0601  INR 1.1    Cardiac Enzymes: No results for input(s): CKTOTAL, CKMB, CKMBINDEX, TROPONINI in the last 168 hours.  BNP (last 3 results) No results for input(s): PROBNP in the last 8760 hours. HbA1C: No results for input(s): HGBA1C in the last 72 hours. CBG: Recent Labs  Lab 03/08/21 1132 03/08/21 1652 03/08/21 2012 03/09/21 0717 03/09/21 1125  GLUCAP 172* 170* 169* 93 185*   Lipid Profile: No results for input(s): CHOL, HDL, LDLCALC, TRIG, CHOLHDL, LDLDIRECT in the last 72 hours. Thyroid Function Tests: No results for input(s): TSH, T4TOTAL, FREET4, T3FREE, THYROIDAB in the last 72 hours. Anemia Panel: No results for input(s): VITAMINB12, FOLATE, FERRITIN, TIBC, IRON, RETICCTPCT in the last 72 hours. Sepsis Labs: No results for input(s): PROCALCITON, LATICACIDVEN in the last 168 hours.  Recent Results (from the past 240 hour(s))  Surgical pcr screen     Status: None   Collection Time: 03/01/21  7:38 AM   Specimen: Nasal Mucosa; Nasal Swab  Result Value Ref Range Status   MRSA, PCR NEGATIVE NEGATIVE Final   Staphylococcus aureus NEGATIVE NEGATIVE Final    Comment: (NOTE) The Xpert SA Assay (FDA approved for NASAL specimens in patients 55 years of age and older), is one component of a comprehensive surveillance program. It is not intended to diagnose infection nor to guide or monitor treatment. Performed at [facility], [place] [address] [place], [place] [address]  Urine Culture     Status: Abnormal   Collection Time: 03/01/21 12:05 PM   Specimen: Urine, Catheterized  Result Value Ref Range Status   Specimen  Description URINE, CATHETERIZED  Final   Special Requests   Final    NONE Performed at [facility], [address], [place], [place] [address]    Culture 80,000 COLONIES/mL YEAST (A)  Final   Report Status 03/02/2021 FINAL  Final  Fungus culture, blood     Status: None (Preliminary result)   Collection Time: 03/01/21  2:26 PM   Specimen: BLOOD  Result Value Ref Range Status   Specimen Description BLOOD RIGHT ANTECUBITAL  Final   Special Requests   Final    BOTTLES DRAWN AEROBIC ONLY Blood Culture adequate volume   Culture   Final    NO FUNGUS ISOLATED AFTER 7 DAYS Performed at [place] [address], [place], [place] [address]    Report Status PENDING  Incomplete  CSF culture w Gram Stain     Status: None   Collection Time: 03/01/21  4:50 PM   Specimen: CSF; Cerebrospinal Fluid  Result Value Ref Range Status   Specimen Description CSF  Final   Special Requests NONE  Final   Gram Stain   Final    WBC PRESENT, PREDOMINANTLY MONONUCLEAR NO ORGANISMS SEEN CYTOSPIN SMEAR    Culture   Final    NO GROWTH 3 DAYS Performed at [facility], [place] [address] [place], [place] [address]    Report Status 03/05/2021 FINAL  Final  Fungus Culture With Stain     Status: None (Preliminary result)   Collection Time: 03/01/21  4:50 PM  Result Value Ref Range Status   Fungus Stain Final report  Final    Comment: (NOTE) Performed At: [facility] [place], [place] [id number] Rush Farmer MD [id number]    Fungus (Mycology) Culture PENDING  Incomplete   Fungal Source CSF  Final    Comment: Performed at [facility], [place] [address] [place], [place] [address]  Acid Fast Smear (AFB)     Status: None   Collection Time: 03/01/21  4:50 PM   Specimen: Cerebrospinal Fluid  Result Value Ref Range Status   AFB Specimen Processing Concentration  Final   Acid Fast Smear Negative  Final    Comment: (NOTE) Performed At: [facility] [place], [place] [id number] Rush Farmer MD [id number]    Source (AFB) CSF  Final    Comment: Performed at [facility], [place] [address], [place], [place] [address]  Fungus Culture Result     Status: None   Collection Time: 03/01/21  4:50 PM  Result Value Ref Range Status   Result 1 Comment  Final    Comment: (NOTE) KOH/Calcofluor preparation:  no fungus observed. Performed At: [facility] [place], [place] [id number] Rush Farmer MD [id number]   Andee Poles, PCR     Status: None   Collection Time: 03/01/21  8:26 PM  Result Value Ref Range Status   Toxoplasma Gondii, PCR Negative Negative Final    Comment: (NOTE) No Toxoplasma gondii DNA detected. This test was developed and its performance characteristics determined by [employer]. It has not been cleared or approved by the U.S. Food and Drug Administration. The FDA has determined that such clearance or approval is not necessary. This test is used for clinical purposes. It should not be  regarded as investigational or research. Performed At: [facility] [place], [place] [id number] Rush Farmer MD [id number]   Aspergillus Ag, BAL/Serum     Status: None   Collection Time: 03/01/21  8:26 PM  Result Value Ref Range Status   Aspergillus Ag, BAL/Serum 0.04 0.00 - 0.49 Index Final    Comment: (NOTE) Performed At: [facility] [place], [place] [id number] Rush Farmer MD [id number]   Resp Panel by RT-PCR (Flu A&B, Covid) Nasopharyngeal Swab     Status: None   Collection Time: 03/06/21 11:58 AM   Specimen: Nasopharyngeal Swab; Nasopharyngeal(NP) swabs in vial transport medium  Result Value Ref Range Status   SARS Coronavirus 2 by RT PCR NEGATIVE NEGATIVE Final    Comment: (NOTE) SARS-CoV-2 target nucleic acids are NOT DETECTED.  The SARS-CoV-2 RNA is generally detectable in upper  respiratory specimens during the acute phase of infection. The lowest concentration of SARS-CoV-2 viral copies this assay can detect is 138 copies/mL. A negative result does not preclude SARS-Cov-2 infection and should not be used as the sole basis for treatment or other patient management decisions. A negative result may occur with  improper specimen collection/handling, submission of specimen other than nasopharyngeal swab, presence of viral mutation(s) within the areas targeted by this assay, and inadequate number of viral copies(<138 copies/mL). A negative result must be combined with clinical observations, patient history, and epidemiological information. The expected result is Negative.  Fact Sheet for Patients:  EntrepreneurPulse.com.au  Fact Sheet for Healthcare Providers:  IncredibleEmployment.be  This test is no t yet approved or cleared by the [place] FDA and  has been authorized for detection and/or diagnosis of SARS-CoV-2 by FDA under an Emergency Use Authorization (EUA). This EUA will remain  in effect (meaning this test can be used) for the duration of the COVID-19 declaration under Section 564(b)(1) of the Act, 21 U.S.C.section 360bbb-3(b)(1), unless the authorization is terminated  or revoked sooner.       Influenza A by PCR NEGATIVE NEGATIVE Final   Influenza B by PCR NEGATIVE NEGATIVE Final    Comment: (NOTE) The Xpert Xpress SARS-CoV-2/FLU/RSV plus assay is intended as an aid in the diagnosis of influenza from Nasopharyngeal swab specimens and should not be used as a sole basis for treatment. Nasal washings and aspirates are unacceptable for Xpert Xpress SARS-CoV-2/FLU/RSV testing.  Fact Sheet for Patients: EntrepreneurPulse.com.au  Fact Sheet for Healthcare Providers: IncredibleEmployment.be  This test is not yet approved or cleared by the [place] FDA and has been  authorized for detection and/or diagnosis of SARS-CoV-2 by FDA under an Emergency Use Authorization (EUA). This EUA will remain in effect (meaning this test can be used) for the duration of the COVID-19 declaration under Section 564(b)(1) of the Act, 21 U.S.C. section 360bbb-3(b)(1), unless the authorization is terminated or revoked.  Performed at [facility], [place] [address], [place], [place] [address]      RN Pressure Injury Documentation:     Estimated body mass index is 23.72 kg/m as calculated from the following:   Height as of this encounter: 5\' 7"  (1.702 m).   Weight as of this encounter: 68.7 kg.  Malnutrition Type: Nutrition Problem: Increased nutrient needs Etiology: acute illness Malnutrition Characteristics: Signs/Symptoms: estimated needs Nutrition Interventions: Interventions: Glucerna shake, Premier Protein, Prostat, MVI  Radiology Studies: No results found.  Scheduled Meds:  (feeding supplement) PROSource Plus  30 mL Oral Daily   amLODipine  2.5 mg Oral BID  brimonidine  1 drop Left Eye TID   Chlorhexidine Gluconate Cloth  6 each Topical Daily   dorzolamide-timolol  1 drop Left Eye BID   feeding supplement (GLUCERNA SHAKE)  237 mL Oral Q24H   fluconazole  200 mg Oral Daily   gabapentin  300 mg Oral QHS   insulin aspart  0-15 Units Subcutaneous TID WC   insulin aspart  0-5 Units Subcutaneous QHS   insulin detemir  15 Units Subcutaneous QHS   multivitamin with minerals  1 tablet Oral Daily   Ensure Max Protein  11 oz Oral Daily   sodium chloride flush  3 mL Intravenous Q12H   Continuous Infusions:  cefTRIAXone (ROCEPHIN)  IV 2 g (03/09/21 1006)   metronidazole 500 mg (03/09/21 1039)    LOS: 3 days   Kerney Elbe, DO [facility] PAGER is on AMION  If 7PM-7AM, please contact night-coverage [email or web address]

## 2021-03-09 NOTE — Evaluation (Addendum)
Occupational Therapy Evaluation Patient Details Name: Jennifer [name] MRN: [id number] DOB: 09/10/1970 Today's Date: 03/09/2021   History of Present Illness The patient is a 50 year old Arabic female with a past medical history significant for but not limited to atrial fibrillation on anticoagulation with Eliquis, history of left eye visual impairment, hypertension, hyperlipidemia as well as diabetes mellitus type 2 who was recently admitted to [facility] from 02/25/2021 until 03/05/2021 for multifocal CVAs who now presents to the hospital for rectal bleeding.   Clinical Impression   Jennifer [name] is a 50 year old woman with multiple hospital admissions since January 2022, recently discharged from [facility] for CVAs and admitted the next day to [facility] for Acute proctitis. Per family prior to patient's ankle fracture in January patient independent and cooking for her family. Since then she has had a consistent decline in functional abilities and strength resulting in not being able to perform IADLs at home and needing assistance with bathing and dressing and needing to ambulate with walker or use a wheelchair out of the home. Per daughter patient has had no rehabilitation out of the hospital with no follow up therapy stating "no one called to set it up.". On evaluation patient presents with decreased upper extremity strength -specifically in the shoulders and hands with noted intrinsic muscle wasting in the hands. She has decreased sensation in her hands and feet from neuropathy but has grossly functional coordination in upper extremities.She demonstrated ability to don her right sock in seated position but needed assistance to pick sock up from the floor when she dropped it. She required min guard for ambulation in room with RW. She overall needs min assist for all ADLs except for mod assist for LB dressing. Patient will benefit from skilled OT services while in hospital to improve  deficits and learn compensatory strategies as needed in order to improve functional abilities and reduce caregiver burden. Recommend aggressive short term rehab at discharge in order to maximize patient's physical abilities in this relatively young woman. Patient and family are highly motivated for patient to return to independence and be able to ambulate safely at home.     Recommendations for follow up therapy are one component of a multi-disciplinary discharge planning process, led by the attending physician.  Recommendations may be updated based on patient status, additional functional criteria and insurance authorization.   Follow Up Recommendations  Acute inpatient rehab (3hours/day)    Assistance Recommended at Discharge Frequent or constant Supervision/Assistance  Functional Status Assessment  Patient has had a recent decline in their functional status and demonstrates the ability to make significant improvements in function in a reasonable and predictable amount of time.  Equipment Recommendations  None recommended by OT    Recommendations for Other Services Rehab consult     Precautions / Restrictions Precautions Precautions: Fall Precaution Comments: interpreter, L eye blindness Restrictions Weight Bearing Restrictions: No      Mobility Bed Mobility               General bed mobility comments: up in chair    Transfers Overall transfer level: Needs assistance Equipment used: Rolling walker (2 wheels) Transfers: Sit to/from Stand Sit to Stand: Min guard           General transfer comment: min guard to ambulate in to bathroom and back to recliner - no physical assistance but close guarding due to loss of balance with PT. Patient keeping walker too far in front of her but no  overt loss of balance.      Balance Overall balance assessment: Needs assistance Sitting-balance support: No upper extremity supported;Feet supported Sitting balance-Leahy Scale: Good      Standing balance support: During functional activity Standing balance-Leahy Scale: Poor Standing balance comment: reliant on UEs and walker                           ADL either performed or assessed with clinical judgement   ADL Overall ADL's : Needs assistance/impaired Eating/Feeding: Set up;Sitting   Grooming: Set up;Sitting   Upper Body Bathing: Set up;Sitting   Lower Body Bathing: Minimal assistance;Sit to/from stand   Upper Body Dressing : Minimal assistance;Sitting Upper Body Dressing Details (indicate cue type and reason): min assist for fasteners Lower Body Dressing: Sit to/from stand;Moderate assistance Lower Body Dressing Details (indicate cue type and reason): Patient able to don socks with figure four method - did drop sock needing assistance to pick it up from floor. requires assistance to don underwear. Toilet Transfer: Min guard;Grab bars;Rolling walker (2 wheels);Ambulation   Toileting- Clothing Manipulation and Hygiene: Min guard;Sit to/from stand       Functional mobility during ADLs: Min guard;Rolling walker (2 wheels) General ADL Comments: Reliance on walker with ADls limits upper extremity use for ADLs.     Vision Baseline Vision/History: 3 Glaucoma;4 Cataracts (blind in left eye) Patient Visual Report: No change from baseline       Perception     Praxis      Pertinent Vitals/Pain Pain Assessment: No/denies pain     Hand Dominance Right   Extremity/Trunk Assessment Upper Extremity Assessment Upper Extremity Assessment: RUE deficits/detail;LUE deficits/detail RUE Deficits / Details: WNL ROM, shoulder strength 4-/5, bicep 4+/5, rist 5/5, grip 4-/5; intrinsic muscle wasting noted. RUE Sensation: history of peripheral neuropathy RUE Coordination:  (WFL; able to perform finger to thumb and finger to nose.) LUE Deficits / Details: WNL ROM, shoulder 4-/5, bicep 4+/5, wrist 4-/5, grip 4-/5, intrinsic muscle wasting noted LUE Sensation:  history of peripheral neuropathy LUE Coordination:  (able to perform finger to thumb and finger to nose.)   Lower Extremity Assessment Lower Extremity Assessment: Defer to PT evaluation   Cervical / Trunk Assessment Cervical / Trunk Assessment: Kyphotic   Communication Communication Communication: Prefers language other than English (Arabic. Prefers son to interpret)   Cognition Arousal/Alertness: Awake/alert Behavior During Therapy: WFL for tasks assessed/performed Overall Cognitive Status: Within Functional Limits for tasks assessed                                 General Comments: able to follow all commands - some repetition required due to intepreter     General Comments       Exercises     Shoulder Instructions      Home Living Family/patient expects to be discharged to:: Private residence Living Arrangements: Children Available Help at Discharge: Family;Available 24 hours/day;Available PRN/intermittently (multiple family members in and out all day. Is alone at times.) Type of Home: Apartment Home Access: Level entry     Home Layout: One level     Bathroom Shower/Tub: Teacher, early years/pre: Handicapped height     Home Equipment: Conservation officer, nature (2 wheels);Wheelchair - manual;BSC/3in1   Additional Comments: acces to RW, and chair for tub (a normal black chair per daughter)      Prior Functioning/Environment Prior Level of Function :  Needs assist       Physical Assist : ADLs (physical)   ADLs (physical): Bathing;Dressing;IADLs (tub transfers) Mobility Comments: Uses RW for mobility; uses WC outside fo home ADLs Comments: Required assist for tub transfers and for LB dressing        OT Problem List: Decreased strength;Decreased range of motion;Decreased activity tolerance;Impaired balance (sitting and/or standing);Decreased knowledge of use of DME or AE      OT Treatment/Interventions: Self-care/ADL training;DME and/or AE  instruction;Therapeutic activities;Patient/family education;Neuromuscular education;Therapeutic exercise;Balance training    OT Goals(Current goals can be found in the care plan section) Acute Rehab OT Goals Patient Stated Goal: to regain strength and be able to cook for family OT Goal Formulation: With family Time For Goal Achievement: 03/22/21 Potential to Achieve Goals: Good  OT Frequency: Min 2X/week   Barriers to D/C:            Co-evaluation              AM-PAC OT "6 Clicks" Daily Activity     Outcome Measure Help from another person eating meals?: A Little Help from another person taking care of personal grooming?: A Little Help from another person toileting, which includes using toliet, bedpan, or urinal?: A Little Help from another person bathing (including washing, rinsing, drying)?: A Little Help from another person to put on and taking off regular upper body clothing?: A Little Help from another person to put on and taking off regular lower body clothing?: A Little 6 Click Score: 18   End of Session Equipment Utilized During Treatment: Rolling walker (2 wheels) Nurse Communication: Mobility status  Activity Tolerance: Patient tolerated treatment well Patient left: in chair;with call bell/phone within reach;with chair alarm set;with family/visitor present  OT Visit Diagnosis: Unsteadiness on feet (R26.81);Repeated falls (R29.6);Muscle weakness (generalized) (M62.81);Other symptoms and signs involving the nervous system (R29.898)                Time: 5681-2751 OT Time Calculation (min): 22 min Charges:  OT General Charges $OT Visit: 1 Visit OT Evaluation $OT Eval Moderate Complexity: 1 Mod  Katerra Ingman, OTR/L [place]  Office [phone] Pager: [place] 03/09/2021, 10:18 AM

## 2021-03-09 NOTE — Evaluation (Addendum)
Physical Therapy Evaluation Patient Details Name: Jennifer [name] MRN: [id number] DOB: 22-Apr-1971 Today's Date: 03/09/2021  History of Present Illness  Jennifer [name] is a 50 y.o. female admitted 03/06/21 with proctitis. Pt was admitted from 02/25/21-03/05/21 at [facility] for multifocal CVAs. PMH: afib, L eye visual impairment, HTN, HLD, diabetes, L heart cath 02/05/21   Clinical Impression  Pt admitted with above diagnosis. Pt's daughter in room assisting with interpreting and PLOF- states pt requires assist with tub transfers, sedentary lifestyle using RW with household distances and w/c to go out into community, and family completes household chores. Pt currently demonstrates RLE weakness at hip and ankle, denies numbness/tingling or other abnormal sensations. Pt with R foot drop when ambulating but able to circumduct and externally rotate to compensate. Pt with 2 LOB, initially requiring min A to recover, pt reports feeling like she tripped, able to sit and rest then ambulate additional 40 ft before tripping sensation again and uses handrail in hallway to recover. Daughter states multiple family members checking on pt, unclear as to how long pt is home alone at times. Daughter also reports pt using w/c in community, unsure if using in the home as well when family is out of the home. Recommend aggressive short term rehab to maximize pt's functional status and independence with mobility. Pt currently with functional limitations due to the deficits listed below (see PT Problem List). Pt will benefit from skilled PT to increase their independence and safety with mobility to allow discharge to the venue listed below.          Recommendations for follow up therapy are one component of a multi-disciplinary discharge planning process, led by the attending physician.  Recommendations may be updated based on patient status, additional functional criteria and insurance authorization.  Follow Up Recommendations Acute  inpatient rehab (3hours/day)    Assistance Recommended at Discharge Frequent or constant Supervision/Assistance  Functional Status Assessment Patient has had a recent decline in their functional status and demonstrates the ability to make significant improvements in function in a reasonable and predictable amount of time.  Equipment Recommendations  None recommended by PT    Recommendations for Other Services Rehab consult     Precautions / Restrictions Precautions Precautions: Fall Precaution Comments: interpreter, L eye blindness Restrictions Weight Bearing Restrictions: No      Mobility  Bed Mobility Overal bed mobility: Needs Assistance Bed Mobility: Supine to Sit  Supine to sit: Supervision  General bed mobility comments: increased time with use of bedrail to bring BLE over to EOB and upright trunk into sitting, supv for safety    Transfers Overall transfer level: Needs assistance Equipment used: Rolling walker (2 wheels) Transfers: Sit to/from Stand Sit to Stand: Min guard  General transfer comment: min guard to power to stand, slightly impulsive with initial stand, hip extension last, VC for hand placement for following reps    Ambulation/Gait Ambulation/Gait assistance: Min guard;Min assist Gait Distance (Feet): 40 Feet (+additional 15 ft) Assistive device: Rolling walker (2 wheels) Gait Pattern/deviations: Step-to pattern;Decreased stride length;Decreased dorsiflexion - right;Decreased dorsiflexion - left Gait velocity: decreased  General Gait Details: R foot drop with hip external rotation and circumduction compensations, minimal L dorsiflexion with foot clearance, 2 minor LOB pt reports "tripping" requiring min A to recover with first and pt able to grab handrail in hallway with 2nd to recover; pt ambulates 15 ft then 40 ft with seated rest break between  Stairs  Wheelchair Mobility    Modified Rankin (Stroke Patients Only)       Balance  Overall balance assessment: Needs assistance Sitting-balance support: Feet supported;No upper extremity supported Sitting balance-Leahy Scale: Good Sitting balance - Comments: seated EOB   Standing balance support: During functional activity;Bilateral upper extremity supported;Reliant on assistive device for balance Standing balance-Leahy Scale: Poor Standing balance comment: reliant on UE support       Pertinent Vitals/Pain Pain Assessment: No/denies pain    Home Living Family/patient expects to be discharged to:: Private residence Living Arrangements: Children Available Help at Discharge: Family;Available 24 hours/day;Available PRN/intermittently (multiple family members in and out all day. Is alone at times.) Type of Home: Apartment Home Access: Level entry  Home Layout: One level Home Equipment: Conservation officer, nature (2 wheels);Wheelchair - manual;BSC/3in1 Additional Comments: acces to RW, and chair for tub (a normal black chair per daughter)    Prior Function Prior Level of Function : Needs assist  Physical Assist : ADLs (physical)  ADLs (physical): Bathing;Dressing;IADLs (tub transfers) Mobility Comments: Uses RW for mobility, uses WC outside for home ADLs Comments: Required assist for tub transfers and for LB dressing, families does household chores     Hand Dominance   Dominant Hand: Right    Extremity/Trunk Assessment   Upper Extremity Assessment Upper Extremity Assessment: Defer to OT evaluation RUE Deficits / Details: WNL ROM, shoulder strength 4-/5, bicep 4+/5, rist 5/5, grip 4-/5; intrinsic muscle wasting noted. RUE Sensation: history of peripheral neuropathy RUE Coordination:  (WFL; able to perform finger to thumb and finger to nose.) LUE Deficits / Details: WNL ROM, shoulder 4-/5, bicep 4+/5, wrist 4-/5, grip 4-/5, intrinsic muscle wasting noted LUE Sensation: history of peripheral neuropathy LUE Coordination:  (able to perform finger to thumb and finger to  nose.)    Lower Extremity Assessment Lower Extremity Assessment: RLE deficits/detail;LLE deficits/detail RLE Deficits / Details: hip flexion 2-/5, hip abd/add 3/5, knee flexion/extension 4/5, ankle PF 2/5, ankle DF 1/5 RLE Sensation: WNL RLE Coordination:  (WFL) LLE Deficits / Details: hip flexion 3+/5, hip abd/add 3+/5, knee flexion/extension 4/5, ankle PF 3/5, ankle DF 2-/5 LLE Sensation: WNL LLE Coordination:  (WFL)    Cervical / Trunk Assessment Cervical / Trunk Assessment: Normal  Communication   Communication: Prefers language other than English (Arabic. Prefers daughter in room to interpret)  Cognition Arousal/Alertness: Awake/alert Behavior During Therapy: WFL for tasks assessed/performed Overall Cognitive Status: Within Functional Limits for tasks assessed  General Comments: slightly impulsive at times, able to follow all commands - some repetition required due to intepreter        General Comments      Exercises     Assessment/Plan    PT Assessment Patient needs continued PT services  PT Problem List Decreased strength;Decreased activity tolerance;Decreased balance;Decreased mobility;Decreased knowledge of use of DME;Decreased safety awareness       PT Treatment Interventions DME instruction;Gait training;Functional mobility training;Therapeutic activities;Therapeutic exercise;Balance training;Neuromuscular re-education;Patient/family education    PT Goals (Current goals can be found in the Care Plan section)  Acute Rehab PT Goals Patient Stated Goal: get stronger and walk PT Goal Formulation: With patient/family Time For Goal Achievement: 03/23/21 Potential to Achieve Goals: Good    Frequency Min 3X/week   Barriers to discharge        Co-evaluation               AM-PAC PT "6 Clicks" Mobility  Outcome Measure Help needed turning from your back to your side while in a flat  bed without using bedrails?: A Little Help needed moving from lying on  your back to sitting on the side of a flat bed without using bedrails?: A Little Help needed moving to and from a bed to a chair (including a wheelchair)?: A Little Help needed standing up from a chair using your arms (e.g., wheelchair or bedside chair)?: A Little Help needed to walk in hospital room?: A Little Help needed climbing 3-5 steps with a railing? : A Lot 6 Click Score: 17    End of Session Equipment Utilized During Treatment: Gait belt Activity Tolerance: Patient tolerated treatment well Patient left: in chair;with call bell/phone within reach;with chair alarm set;with family/visitor present Nurse Communication: Mobility status PT Visit Diagnosis: Unsteadiness on feet (R26.81);Other abnormalities of gait and mobility (R26.89);Muscle weakness (generalized) (M62.81);Difficulty in walking, not elsewhere classified (R26.2)    Time: 9470-9628 PT Time Calculation (min) (ACUTE ONLY): 23 min   Charges:   PT Evaluation $PT Eval Moderate Complexity: 1 Mod           Tori Crystallee Werden PT, DPT 03/09/21, 10:41 AM

## 2021-03-10 LAB — CBC WITH DIFFERENTIAL/PLATELET
Abs Immature Granulocytes: 0.03 10*3/uL (ref 0.00–0.07)
Basophils Absolute: 0 10*3/uL (ref 0.0–0.1)
Basophils Relative: 1 %
Eosinophils Absolute: 0.2 10*3/uL (ref 0.0–0.5)
Eosinophils Relative: 4 %
HCT: 28.1 % — ABNORMAL LOW (ref 36.0–46.0)
Hemoglobin: 8.8 g/dL — ABNORMAL LOW (ref 12.0–15.0)
Immature Granulocytes: 1 %
Lymphocytes Relative: 33 %
Lymphs Abs: 2 10*3/uL (ref 0.7–4.0)
MCH: 28.2 pg (ref 26.0–34.0)
MCHC: 31.3 g/dL (ref 30.0–36.0)
MCV: 90.1 fL (ref 80.0–100.0)
Monocytes Absolute: 0.4 10*3/uL (ref 0.1–1.0)
Monocytes Relative: 7 %
Neutro Abs: 3.4 10*3/uL (ref 1.7–7.7)
Neutrophils Relative %: 54 %
Platelets: 215 10*3/uL (ref 150–400)
RBC: 3.12 MIL/uL — ABNORMAL LOW (ref 3.87–5.11)
RDW: 12.3 % (ref 11.5–15.5)
WBC: 6.1 10*3/uL (ref 4.0–10.5)
nRBC: 0 % (ref 0.0–0.2)

## 2021-03-10 LAB — GLUCOSE, CAPILLARY
Glucose-Capillary: 130 mg/dL — ABNORMAL HIGH (ref 70–99)
Glucose-Capillary: 131 mg/dL — ABNORMAL HIGH (ref 70–99)
Glucose-Capillary: 165 mg/dL — ABNORMAL HIGH (ref 70–99)
Glucose-Capillary: 199 mg/dL — ABNORMAL HIGH (ref 70–99)

## 2021-03-10 LAB — PHOSPHORUS: Phosphorus: 2.7 mg/dL (ref 2.5–4.6)

## 2021-03-10 LAB — COMPREHENSIVE METABOLIC PANEL
ALT: 14 U/L (ref 0–44)
AST: 20 U/L (ref 15–41)
Albumin: 2.9 g/dL — ABNORMAL LOW (ref 3.5–5.0)
Alkaline Phosphatase: 99 U/L (ref 38–126)
Anion gap: 6 (ref 5–15)
BUN: 20 mg/dL (ref 6–20)
CO2: 26 mmol/L (ref 22–32)
Calcium: 8.7 mg/dL — ABNORMAL LOW (ref 8.9–10.3)
Chloride: 108 mmol/L (ref 98–111)
Creatinine, Ser: 0.92 mg/dL (ref 0.44–1.00)
GFR, Estimated: >60 mL/min (ref >60)
Glucose, Bld: 175 mg/dL — ABNORMAL HIGH (ref 70–99)
Potassium: 3.9 mmol/L (ref 3.5–5.1)
Sodium: 140 mmol/L (ref 135–145)
Total Bilirubin: 0.2 mg/dL — ABNORMAL LOW (ref 0.3–1.2)
Total Protein: 6.3 g/dL — ABNORMAL LOW (ref 6.5–8.1)

## 2021-03-10 LAB — MAGNESIUM: Magnesium: 2.1 mg/dL (ref 1.7–2.4)

## 2021-03-10 MED ORDER — CEFDINIR 300 MG PO CAPS
300.0000 mg | ORAL_CAPSULE | Freq: Two times a day (BID) | ORAL | Status: DC
  Administered 2021-03-10 – 2021-03-15 (×11): 300 mg via ORAL
  Filled 2021-03-10 (×11): qty 1

## 2021-03-10 MED ORDER — METRONIDAZOLE 500 MG PO TABS
500.0000 mg | ORAL_TABLET | Freq: Two times a day (BID) | ORAL | Status: DC
  Administered 2021-03-10 – 2021-03-15 (×11): 500 mg via ORAL
  Filled 2021-03-10 (×11): qty 1

## 2021-03-10 NOTE — Progress Notes (Signed)
Krisha Beegle (Daughter)  [phone] and Keni Wafer (son) [phone] are both [place] and Arabic speaking.

## 2021-03-10 NOTE — Progress Notes (Signed)
The patient complained of pain during flush of intravenous site. Site was hard to flush also. Provider updated. Antibiotics will be changed to PO.

## 2021-03-10 NOTE — TOC Progression Note (Addendum)
Transition of Care [facility]) - Progression Note    Patient Details  Name: Jennifer [name] MRN: [id number] Date of Birth: 02-Apr-1971  [facility] of [facility] CM/SW Contact  Cecil Cobbs Phone Number: 03/10/2021, 10:02 AM  Clinical Narrative:     PT and OT recommended CIR for rehab.  CSW contacted inpatient rehab to find out if they have had a chance to review patient, CSW awaiting for response back.  10:45am  CSW was informed that CIR has not reviewed patient yet.       Expected Discharge Plan and Services  Inpatient rehab.         Expected Discharge Date:  (unknown)                                     Social Determinants of Health (SDOH) Interventions    Readmission Risk Interventions Readmission Risk Prevention Plan 04/12/2020  Transportation Screening Complete  PCP or Specialist Appt within 3-5 Days Complete  HRI or [place] Complete  Social Work Consult for [place] Planning/Counseling Complete  Palliative Care Screening Not Applicable  Medication Review Press photographer) Complete  Some recent data might be hidden

## 2021-03-10 NOTE — Progress Notes (Signed)
PROGRESS NOTE    Jennifer [name]  QBV:[id number] DOB: [birth year] DOA: 03/06/2021 PCP: Jose Persia, MD  Brief Narrative:  The patient is a 50 year old Arabic female with a past medical history significant for but not limited to atrial fibrillation on anticoagulation with Eliquis, history of left eye visual impairment, hypertension, hyperlipidemia as well as diabetes mellitus type 2 who was recently admitted to [facility] from 02/25/2021 until 03/05/2021 for multifocal CVAs who now presents to the hospital for rectal bleeding.  She states that she is okay when she left the hospital the day before yesterday but when she got home her stomach was hurting and she complained about this in the hospital and told that it might be for medications.  He states that it started around 2:30 PM and she states that she started having 3-4 stools overnight and noticed blood in the stool by the end she had frank blood clots last time.  She was having left flank pain and her last dose of Eliquis was yesterday morning and did not take any medications last night.  Upon arrival to the ED she had 4 bloody stools and has a Foley catheter for retention.  She is heme positive and GI was consulted.  CT of the abdomen pelvis showed concern for colitis and proctitis so she is started on IV Rocephin and Flagyl.  Rectal bleeding has improved however hemoglobin is dropped a little bit but is relatively stable and slightly improved.  GI evaluated and recommending placing this patient on soft diet.  If she tolerates a soft diet without issues she can be discharged home however she is significantly weak so we will obtain a PT and OT to further evaluate and they are recommending CIR admission.  We have discontinued her Foley and will be checking for residuals she is voiding well.  Because of her IV issues today we will change her to p.o. cefdinir and Flagyl. . Assessment & Plan:   Principal Problem:   Acute proctitis Active  Problems:   Type II diabetes mellitus (HCC)   Atrial fibrillation (HCC)   Blind left eye   CKD (chronic kidney disease) stage 3, GFR 30-59 ml/min (HCC)   Brain lesion   Acute lower GI bleeding   Glaucoma   Acute urinary retention   At risk for medication noncompliance   Proctitis/Colitis with GI bleeding, improved  -She was hospitalized from 02/25/21 until 03/05/2021 and discharged yesterday -She developed abdominal pain prior to discharge and then had recurrent bloody stools overnight -CT scan showed "Prominent edematous wall thickening in the rectum with perirectal and presacral edema. Imaging features suggest infectious/inflammatory proctitis. There is probably some associated wall thickening in the site distal sigmoid colon in features may reflect a left-sided infectious/inflammatory colitis. Foley catheter decompresses the urinary bladder. Gas in the bladder lumen is compatible with the instrumentation." -She is admitted for ongoing management of her abdominal pain and bloody bowel movement have improved -She was initiated on IV Cipro Flagyl and was changed to ceftriaxone and Flagyl will continue and GI recommends 10-day total course of Cipro Flagyl for possible infectious proctocolitis but will also change to p.o. cefdinir Flagyl for total 10-day course -Hemoglobin has dropped and now went from 10.2/32.6 -> 9.0/31.4  -> 8.2/26.4 -> 8.4/27.6 -> 8.6/27.7 -> 8.2/26.8 and today it was 8.8/28.1 -Continue to hold Eliquis for now for 5 days total from a neurological standpoint -She is initiated on a clear liquid diet for now and will need further diet advancement per  GI and she was advanced to a soft diet and she is tolerating this well -Patient is having some diarrhea and abdominal cramping but no real blood -GI feels that this is a difficult case given her recent CVA and they recommended her for bleeding so long they would be reluctant to do a colonoscopy on the other hand if her worsens they  can do a sigmoidoscopy versus minimal or no sedation. -GI recommends holding Eliquis for the next 5 days if at all possible but if felt to be a high risk from stroke recurrence standpoint consider IV heparin and see how she does however I spoke with Dr. Leonel Ramsay who recommends no anticoagulation given her recent bleeding -IVF -C/w Supportive Care with Antiemtics with Ondansetron 4 mg po/IV q6hprn Nausea -Nutritionist has been consulted and recommending Glucerna shake once a day as well as Ensure max once a day as well as 30 mL of Prosource plus once a day and multivitamin with minerals daily -Discussed with neurology Dr. Leonel Ramsay who recommends that the risks of anticoagulation outweigh the benefits currently and recommends resuming once okay from a GI standpoint.   Prior CVA/abnormal MRI with ? Brain Metastases  -She was seen by neurology during the prior hospitalization -She is scheduled for repeat imaging in 4 weeks with plan for close neurology outpatient follow-up -There is concern for severely uncontrolled vascular factors noted and appears that this may be her primary issue -Unfortunately we will continue to hold Eliquis for now given her GI bleeding and GI recommends for 5 days; may consider resuming anticoagulation with heparin drip but will hold off and resume anticoagulation in outpatient setting -Discussed with neurology -We will obtain PT OT to further evaluate and treat given that she is significantly weak and they are recommending CIR; Rehab Admissions Coordinator consulted for further evaluation   Atrial fibrillation -She was previously on metoprolol but this was held during last admission due to soft blood pressure and she is to restart slowly -Currently holding anticoagulation of the cause given her GI bleeding and recommended resuming in 5 days (1 days left)   Uncontrolled diabetes mellitus type 2 with hyperglycemia -Globin AC was 12.1 indicating very poor control -She  is initiated on Levemir which we will continue at 12 units  -We will continue to hold Tradjenta and metformin -Continue with Moderate NovoLog/scale insulin before meals and at bedtime -She is currently taking gabapentin 300 mg p.o. nightly -CBGs ranging from 130-212  HTN -Continue Amlodipine 2.5 mg pO BID -Continue to Monitor BP per Protocol -C/w Hydralazine 5 mg IV q4hprn High Blood Pressure  -Last BP reading was 141/69  Stage 3a CKD -She had an AKI during her prior hospitalization which is now resolved -Creatinine appears to be her baseline yesterday her BUN/creatinine was 8/0.19 -> 17/1.16 -> 17/0.95 -> 20/0.92 -Getting IVF as above and have now stopped -Avoid nephrotoxic medications, contrast dyes, hypotension and dehydration and renally dose medications -Continue to monitor and trend renal function repeat CMP in a.m.  Glaucoma -She has Left Eye Blindness -Followed by [facility] Manuella Gh[facility] at [facility] -C/w Brimonidine 1 drop Left eye TID and Dorzolamide-Timolol 22.3-6.8 1 drop Left Eye BID  Urinary Retention  -Has an indwelling Foley with Plan for Outpatient Urology F/U within 1 week to follow-up with him in tomorrow.  We will discontinue Foley catheter for now and see if she can void on her own given that she was having some vaginal discomfort -Urine Cx Grew 80 Yeast and she was started  on 14 Days of Diflucan 200 mg po Daily    Medication Noncompliance  -Her daughter noted confusion about which meds she is supposed to be taking, polypharmacy -Suggest close ongoing discussion and education regarding importance of medications during and after hospitalization -Suggest specific instructions given her language barrier and possible low medical literacy   DVT prophylaxis: SCDs Code Status: FULL CODE  Family Communication: Discussed with daughter at bedside Disposition Plan: Pending GI Clearance and tolerance of diet as well as evaluation by PT and OT.  Status is: Inpatient  Remains  inpatient appropriate because: She needs GI clearance prior to safe discharge disposition  Consultants:  Gastroenterology   Procedures:  None  Antimicrobials: Anti-infectives (From admission, onward)    Start     Dose/Rate Route Frequency Ordered Stop   03/10/21 1545  metroNIDAZOLE (FLAGYL) tablet 500 mg        500 mg Oral Every 12 hours 03/10/21 1538     03/10/21 1545  cefdinir (OMNICEF) capsule 300 mg        300 mg Oral Every 12 hours 03/10/21 1538     03/07/21 1000  cefTRIAXone (ROCEPHIN) 2 g in sodium chloride 0.9 % 100 mL IVPB  Status:  Discontinued        2 g 200 mL/hr over 30 Minutes Intravenous Every 24 hours 03/06/21 1314 03/10/21 1538   03/06/21 2200  metroNIDAZOLE (FLAGYL) IVPB 500 mg  Status:  Discontinued        500 mg 100 mL/hr over 60 Minutes Intravenous Every 12 hours 03/06/21 1314 03/10/21 1538   03/06/21 1445  fluconazole (DIFLUCAN) tablet 200 mg        200 mg Oral Daily 03/06/21 1314     03/06/21 1030  metroNIDAZOLE (FLAGYL) IVPB 500 mg        500 mg 100 mL/hr over 60 Minutes Intravenous  Once 03/06/21 1021 03/06/21 1226   03/06/21 1030  cefTRIAXone (ROCEPHIN) 1 g in sodium chloride 0.9 % 100 mL IVPB        1 g 200 mL/hr over 30 Minutes Intravenous  Once 03/06/21 1021 03/06/21 1226        Subjective: Seen and examined at bedside with assistance of the AMN translator Mervat #[id number]. She is complaining of some adbominal cramping relieved with a bowel movement and states she is having diarrhea now since 3 am. Nursing states she has had 3 bowel movements bloody streaking blood pressure denies any further bleeding.  She feels okay now and still awaiting CIR evaluation wanting to go home.  She denies any other concerns or points this time.  Objective: Vitals:   03/09/21 2148 03/10/21 0556 03/10/21 1020 03/10/21 1321  BP: (!) 149/70 124/68 117/66 (!) 141/69  Pulse: 89 73  81  Resp: 18 18 16 18   Temp: 98.4 F (36.9 C) 98.1 F (36.7 C)  98.7 F (37.1 C)   TempSrc: Oral Oral  Oral  SpO2: 100% 96%  98%  Weight:      Height:        Intake/Output Summary (Last 24 hours) at 03/10/2021 1541 Last data filed at 03/10/2021 1300 Gross per 24 hour  Intake 760 ml  Output 606 ml  Net 154 ml    Filed Weights   03/06/21 2100  Weight: 68.7 kg   Examination: Physical Exam:  Constitutional: WN/WD Arabic female in no acute distress appears calm and comfortable Eyes:  Lids and conjunctivae normal, sclerae anicteric  ENMT: External Ears, Nose appear normal. Grossly normal  hearing. Mucous membranes are moist.  Neck: Appears normal, supple, no cervical masses, normal ROM, no appreciable thyromegaly no appreciable JVD Respiratory: Diminished to auscultation bilaterally, no wheezing, rales, rhonchi or crackles. Normal respiratory effort and patient is not tachypenic. No accessory muscle use.  Unlabored breathing Cardiovascular: RRR, no murmurs / rubs / gallops. S1 and S2 auscultated.  Abdomen: Soft, slightly-tender, non-distended. Bowel sounds positive.  GU: Deferred. Musculoskeletal: No clubbing / cyanosis of digits/nails. No joint deformity upper and lower extremities.  Skin: No rashes, lesions, ulcers on limited skin evaluation. No induration; Warm and dry.  Neurologic: CN 2-12 grossly intact with no focal deficits. Romberg sign and cerebellar reflexes not assessed.  Psychiatric: Normal judgment and insight. Alert and oriented x 3. Normal mood and appropriate affect.    Data Reviewed: I have personally reviewed following labs and imaging studies  CBC: Recent Labs  Lab 03/06/21 0601 03/06/21 1700 03/07/21 0446 03/07/21 1743 03/08/21 0435 03/09/21 0424 03/10/21 0427  WBC 6.1   < > 5.0 5.6 4.8 5.2 6.1  NEUTROABS 4.1  --   --   --  2.1 2.6 3.4  HGB 10.2*   < > 8.2* 8.4* 8.6* 8.2* 8.8*  HCT 32.6*   < > 26.4* 27.6* 27.7* 26.8* 28.1*  MCV 89.1   < > 89.2 89.0 89.4 89.3 90.1  PLT 253   < > 231 248 217 216 215   < > = values in this interval  not displayed.    Basic Metabolic Panel: Recent Labs  Lab 03/04/21 0847 03/05/21 0809 03/06/21 0601 03/07/21 0446 03/08/21 0435 03/09/21 0424 03/10/21 0427  NA 137   < > 140 141 138 140 140  K 4.0   < > 4.7 4.0 4.2 4.0 3.9  CL 103   < > 106 109 107 110 108  CO2 25   < > 24 27 26 26 26   GLUCOSE 205*   < > 181* 83 111* 157* 175*  BUN 25*   < > 22* 18 17 17 20   CREATININE 1.29*   < > 1.15* 1.09* 1.16* 0.95 0.92  CALCIUM 8.9   < > 9.4 8.7* 8.6* 8.4* 8.7*  MG  --   --   --   --  2.0 2.0 2.1  PHOS 4.3  --   --   --  4.0 3.3 2.7   < > = values in this interval not displayed.    GFR: Estimated Creatinine Clearance: 71.9 mL/min (by C-G formula based on SCr of 0.92 mg/dL). Liver Function Tests: Recent Labs  Lab 03/04/21 0847 03/06/21 0601 03/08/21 0435 03/09/21 0424 03/10/21 0427  AST  --  17 16 17 20   ALT  --  18 11 13 14   ALKPHOS  --  107 78 89 99  BILITOT  --  0.4 0.3 0.1* 0.2*  PROT  --  7.5 6.2* 5.9* 6.3*  ALBUMIN 2.2* 3.1* 2.6* 2.7* 2.9*    No results for input(s): LIPASE, AMYLASE in the last 168 hours. No results for input(s): AMMONIA in the last 168 hours.  Coagulation Profile: Recent Labs  Lab 03/06/21 0601  INR 1.1    Cardiac Enzymes: No results for input(s): CKTOTAL, CKMB, CKMBINDEX, TROPONINI in the last 168 hours.  BNP (last 3 results) No results for input(s): PROBNP in the last 8760 hours. HbA1C: No results for input(s): HGBA1C in the last 72 hours. CBG: Recent Labs  Lab 03/09/21 1618 03/09/21 2136 03/09/21 2146 03/10/21 0805 03/10/21 1131  GLUCAP  161* 212* 208* 130* 131*   Lipid Profile: No results for input(s): CHOL, HDL, LDLCALC, TRIG, CHOLHDL, LDLDIRECT in the last 72 hours. Thyroid Function Tests: No results for input(s): TSH, T4TOTAL, FREET4, T3FREE, THYROIDAB in the last 72 hours. Anemia Panel: No results for input(s): VITAMINB12, FOLATE, FERRITIN, TIBC, IRON, RETICCTPCT in the last 72 hours. Sepsis Labs: No results for  input(s): PROCALCITON, LATICACIDVEN in the last 168 hours.  Recent Results (from the past 240 hour(s))  Surgical pcr screen     Status: None   Collection Time: 03/01/21  7:38 AM   Specimen: Nasal Mucosa; Nasal Swab  Result Value Ref Range Status   MRSA, PCR NEGATIVE NEGATIVE Final   Staphylococcus aureus NEGATIVE NEGATIVE Final    Comment: (NOTE) The Xpert SA Assay (FDA approved for NASAL specimens in patients 34 years of age and older), is one component of a comprehensive surveillance program. It is not intended to diagnose infection nor to guide or monitor treatment. Performed at [facility], [place] [address], [place], [place] [address]   Urine Culture     Status: Abnormal   Collection Time: 03/01/21 12:05 PM   Specimen: Urine, Catheterized  Result Value Ref Range Status   Specimen Description URINE, CATHETERIZED  Final   Special Requests   Final    NONE Performed at [facility], [place] [address] [place], [place] [address]    Culture 80,000 COLONIES/mL YEAST (A)  Final   Report Status 03/02/2021 FINAL  Final  Fungus culture, blood     Status: None (Preliminary result)   Collection Time: 03/01/21  2:26 PM   Specimen: BLOOD  Result Value Ref Range Status   Specimen Description BLOOD RIGHT ANTECUBITAL  Final   Special Requests   Final    BOTTLES DRAWN AEROBIC ONLY Blood Culture adequate volume   Culture   Final    NO FUNGUS ISOLATED AFTER 7 DAYS Performed at [place] [address], [place], [place] [address]    Report Status PENDING  Incomplete  CSF culture w Gram Stain     Status: None   Collection Time: 03/01/21  4:50 PM   Specimen: CSF; Cerebrospinal Fluid  Result Value Ref Range Status   Specimen Description CSF  Final   Special Requests NONE  Final   Gram Stain   Final    WBC PRESENT, PREDOMINANTLY MONONUCLEAR NO ORGANISMS SEEN CYTOSPIN SMEAR    Culture   Final    NO GROWTH 3 DAYS Performed at [facility], [place] [address],  [place], [place] [address]    Report Status 03/05/2021 FINAL  Final  Fungus Culture With Stain     Status: None (Preliminary result)   Collection Time: 03/01/21  4:50 PM  Result Value Ref Range Status   Fungus Stain Final report  Final    Comment: (NOTE) Performed At: [facility] [place], [place] [id number] Rush Farmer MD [id number]    Fungus (Mycology) Culture PENDING  Incomplete   Fungal Source CSF  Final    Comment: Performed at [facility], [place] [address] [place], [place] [address]  Acid Fast Smear (AFB)     Status: None   Collection Time: 03/01/21  4:50 PM   Specimen: Cerebrospinal Fluid  Result Value Ref Range Status   AFB Specimen Processing Concentration  Final   Acid Fast Smear Negative  Final    Comment: (NOTE) Performed At: [facility] [address]  [address] [place], [place] [id number] Rush Farmer MD [id number]    Source (AFB) CSF  Final    Comment: Performed at [facility], [place] [address], [place], [place] [address]  Fungus Culture Result     Status: None   Collection Time: 03/01/21  4:50 PM  Result Value Ref Range Status   Result 1 Comment  Final    Comment: (NOTE) KOH/Calcofluor preparation:  no fungus observed. Performed At: [facility] [place], [place] [id number] Rush Farmer MD PZ:[id number]   Andee Poles, PCR     Status: None   Collection Time: 03/01/21  8:26 PM  Result Value Ref Range Status   Toxoplasma Gondii, PCR Negative Negative Final    Comment: (NOTE) No Toxoplasma gondii DNA detected. This test was developed and its performance characteristics determined by [employer]. It has not been cleared or approved by the U.S. Food and Drug Administration. The FDA has determined that such clearance or approval is not necessary. This test is used for clinical purposes. It should not be regarded as investigational or research. Performed At: [facility] [place], [place] [id number] Rush Farmer MD [id number]   Aspergillus Ag, BAL/Serum     Status: None   Collection Time: 03/01/21  8:26 PM  Result Value Ref Range Status   Aspergillus Ag, BAL/Serum 0.04 0.00 - 0.49 Index Final    Comment: (NOTE) Performed At: [facility] [place], [place] [id number] Rush Farmer MD [id number]   Resp Panel by RT-PCR (Flu A&B, Covid) Nasopharyngeal Swab     Status: None   Collection Time: 03/06/21 11:58 AM   Specimen: Nasopharyngeal Swab; Nasopharyngeal(NP) swabs in vial transport medium  Result Value Ref Range Status   SARS Coronavirus 2 by RT PCR NEGATIVE NEGATIVE Final    Comment: (NOTE) SARS-CoV-2 target nucleic acids are NOT DETECTED.  The SARS-CoV-2 RNA is generally detectable in upper respiratory specimens during the acute phase of infection. The lowest concentration of SARS-CoV-2 viral copies this assay can detect is 138 copies/mL. A negative result does not preclude SARS-Cov-2 infection and should not be used as the sole basis for treatment or other patient management decisions. A negative result may occur with  improper specimen collection/handling, submission of specimen other than nasopharyngeal swab, presence of viral mutation(s) within the areas targeted by this assay, and inadequate number of viral copies(<138 copies/mL). A negative result must be combined with clinical observations, patient history, and epidemiological information. The expected result is Negative.  Fact Sheet for Patients:  EntrepreneurPulse.com.au  Fact Sheet for Healthcare Providers:  IncredibleEmployment.be  This test is no t yet approved or cleared by the [place] FDA and  has been authorized for detection and/or diagnosis of SARS-CoV-2 by FDA under an Emergency Use Authorization (EUA). This EUA will remain  in effect (meaning this test can be used) for the  duration of the COVID-19 declaration under Section 564(b)(1) of the Act, 21 U.S.C.section 360bbb-3(b)(1), unless the authorization is terminated  or revoked sooner.       Influenza A by PCR NEGATIVE NEGATIVE Final   Influenza B by PCR NEGATIVE NEGATIVE Final    Comment: (NOTE) The Xpert Xpress SARS-CoV-2/FLU/RSV plus assay is intended as an aid in the diagnosis of influenza from Nasopharyngeal swab specimens and should not be used as a sole basis for treatment. Nasal washings and aspirates are unacceptable for Xpert Xpress SARS-CoV-2/FLU/RSV testing.  Fact Sheet for Patients: EntrepreneurPulse.com.au  Fact Sheet for Healthcare  Providers: IncredibleEmployment.be  This test is not yet approved or cleared by the [place] and has been authorized for detection and/or diagnosis of SARS-CoV-2 by FDA under an Emergency Use Authorization (EUA). This EUA will remain in effect (meaning this test can be used) for the duration of the COVID-19 declaration under Section 564(b)(1) of the Act, 21 U.S.C. section 360bbb-3(b)(1), unless the authorization is terminated or revoked.  Performed at [facility], [place] [address] [place], [place] [address]   Resp Panel by RT-PCR (Flu A&B, Covid) Nasopharyngeal Swab     Status: None   Collection Time: 03/09/21  2:56 PM   Specimen: Nasopharyngeal Swab; Nasopharyngeal(NP) swabs in vial transport medium  Result Value Ref Range Status   SARS Coronavirus 2 by RT PCR NEGATIVE NEGATIVE Final    Comment: (NOTE) SARS-CoV-2 target nucleic acids are NOT DETECTED.  The SARS-CoV-2 RNA is generally detectable in upper respiratory specimens during the acute phase of infection. The lowest concentration of SARS-CoV-2 viral copies this assay can detect is 138 copies/mL. A negative result does not preclude SARS-Cov-2 infection and should not be used as the sole basis for treatment or other patient  management decisions. A negative result may occur with  improper specimen collection/handling, submission of specimen other than nasopharyngeal swab, presence of viral mutation(s) within the areas targeted by this assay, and inadequate number of viral copies(<138 copies/mL). A negative result must be combined with clinical observations, patient history, and epidemiological information. The expected result is Negative.  Fact Sheet for Patients:  EntrepreneurPulse.com.au  Fact Sheet for Healthcare Providers:  IncredibleEmployment.be  This test is no t yet approved or cleared by the [place] FDA and  has been authorized for detection and/or diagnosis of SARS-CoV-2 by FDA under an Emergency Use Authorization (EUA). This EUA will remain  in effect (meaning this test can be used) for the duration of the COVID-19 declaration under Section 564(b)(1) of the Act, 21 U.S.C.section 360bbb-3(b)(1), unless the authorization is terminated  or revoked sooner.       Influenza A by PCR NEGATIVE NEGATIVE Final   Influenza B by PCR NEGATIVE NEGATIVE Final    Comment: (NOTE) The Xpert Xpress SARS-CoV-2/FLU/RSV plus assay is intended as an aid in the diagnosis of influenza from Nasopharyngeal swab specimens and should not be used as a sole basis for treatment. Nasal washings and aspirates are unacceptable for Xpert Xpress SARS-CoV-2/FLU/RSV testing.  Fact Sheet for Patients: EntrepreneurPulse.com.au  Fact Sheet for Healthcare Providers: IncredibleEmployment.be  This test is not yet approved or cleared by the [place] FDA and has been authorized for detection and/or diagnosis of SARS-CoV-2 by FDA under an Emergency Use Authorization (EUA). This EUA will remain in effect (meaning this test can be used) for the duration of the COVID-19 declaration under Section 564(b)(1) of the Act, 21 U.S.C. section 360bbb-3(b)(1),  unless the authorization is terminated or revoked.  Performed at [facility], [place] [address], [place], [place] [address]      RN Pressure Injury Documentation:     Estimated body mass index is 23.72 kg/m as calculated from the following:   Height as of this encounter: 5\' 7"  (1.702 m).   Weight as of this encounter: 68.7 kg.  Malnutrition Type: Nutrition Problem: Increased nutrient needs Etiology: acute illness Malnutrition Characteristics: Signs/Symptoms: estimated needs Nutrition Interventions: Interventions: Glucerna shake, Premier Protein, Prostat, MVI  Radiology Studies: No results found.  Scheduled Meds:  (feeding supplement) PROSource Plus  30 mL Oral Daily  amLODipine  2.5 mg Oral BID   brimonidine  1 drop Left Eye TID   cefdinir  300 mg Oral Q12H   Chlorhexidine Gluconate Cloth  6 each Topical Daily   dorzolamide-timolol  1 drop Left Eye BID   feeding supplement (GLUCERNA SHAKE)  237 mL Oral Q24H   fluconazole  200 mg Oral Daily   gabapentin  300 mg Oral QHS   insulin aspart  0-15 Units Subcutaneous TID WC   insulin aspart  0-5 Units Subcutaneous QHS   insulin detemir  15 Units Subcutaneous QHS   metroNIDAZOLE  500 mg Oral Q12H   multivitamin with minerals  1 tablet Oral Daily   Ensure Max Protein  11 oz Oral Daily   sodium chloride flush  3 mL Intravenous Q12H   Continuous Infusions:    LOS: 4 days   [place], DO [facility] PAGER is on AMION  If 7PM-7AM, please contact night-coverage [email or web address]

## 2021-03-10 NOTE — Progress Notes (Signed)
Inpatient Rehab Admissions:  Inpatient Rehab Consult received.  I spoke with patient on the telephone for rehabilitation assessment and to discuss goals and expectations of an inpatient rehab admission.  Utilized the language line. Had to use three interpretors: [facility] #[id number], [facility] #[id number], Irving Copas [phone]. When [facility] was utilizing [place] as the interpretor, we got disconnected form Safia prior to the end of the conversation. When Lucas was the interpretor, he was having a hard time understanding her due to dialect differences. When [facility] was the interpretor, pt did not answer the phone x2 attempts. Abdul once again attempted to contact pt so [facility] could complete the conversation regarding CIR, but she did not answer the phone. During the initial conversation, pt did acknowledge understanding of CIR goals and expectations. Pt did say she is interested in pursuing CIR. Will f/u with pt to complete the conversation regarding CIR at a later date. Will continue to follow.  Signed: Gayland Curry, [place], [place] Admissions Coordinator [phone]

## 2021-03-11 LAB — CBC WITH DIFFERENTIAL/PLATELET
Abs Immature Granulocytes: 0.03 10*3/uL (ref 0.00–0.07)
Basophils Absolute: 0 10*3/uL (ref 0.0–0.1)
Basophils Relative: 1 %
Eosinophils Absolute: 0.2 10*3/uL (ref 0.0–0.5)
Eosinophils Relative: 4 %
HCT: 26.5 % — ABNORMAL LOW (ref 36.0–46.0)
Hemoglobin: 8.2 g/dL — ABNORMAL LOW (ref 12.0–15.0)
Immature Granulocytes: 1 %
Lymphocytes Relative: 37 %
Lymphs Abs: 2.1 10*3/uL (ref 0.7–4.0)
MCH: 27.8 pg (ref 26.0–34.0)
MCHC: 30.9 g/dL (ref 30.0–36.0)
MCV: 89.8 fL (ref 80.0–100.0)
Monocytes Absolute: 0.3 10*3/uL (ref 0.1–1.0)
Monocytes Relative: 6 %
Neutro Abs: 2.9 10*3/uL (ref 1.7–7.7)
Neutrophils Relative %: 51 %
Platelets: 203 10*3/uL (ref 150–400)
RBC: 2.95 MIL/uL — ABNORMAL LOW (ref 3.87–5.11)
RDW: 12.4 % (ref 11.5–15.5)
WBC: 5.6 10*3/uL (ref 4.0–10.5)
nRBC: 0 % (ref 0.0–0.2)

## 2021-03-11 LAB — COMPREHENSIVE METABOLIC PANEL
ALT: 12 U/L (ref 0–44)
AST: 15 U/L (ref 15–41)
Albumin: 2.7 g/dL — ABNORMAL LOW (ref 3.5–5.0)
Alkaline Phosphatase: 82 U/L (ref 38–126)
Anion gap: 4 — ABNORMAL LOW (ref 5–15)
BUN: 22 mg/dL — ABNORMAL HIGH (ref 6–20)
CO2: 27 mmol/L (ref 22–32)
Calcium: 8.5 mg/dL — ABNORMAL LOW (ref 8.9–10.3)
Chloride: 109 mmol/L (ref 98–111)
Creatinine, Ser: 0.91 mg/dL (ref 0.44–1.00)
GFR, Estimated: >60 mL/min (ref >60)
Glucose, Bld: 97 mg/dL (ref 70–99)
Potassium: 4.1 mmol/L (ref 3.5–5.1)
Sodium: 140 mmol/L (ref 135–145)
Total Bilirubin: 0.4 mg/dL (ref 0.3–1.2)
Total Protein: 6 g/dL — ABNORMAL LOW (ref 6.5–8.1)

## 2021-03-11 LAB — MAGNESIUM: Magnesium: 2 mg/dL (ref 1.7–2.4)

## 2021-03-11 LAB — VITAMIN E
Vitamin E (Alpha Tocopherol): 15.4 mg/L (ref 7.0–25.1)
Vitamin E(Gamma Tocopherol): 1.6 mg/L (ref 0.5–5.5)

## 2021-03-11 LAB — GLUCOSE, CAPILLARY
Glucose-Capillary: 88 mg/dL (ref 70–99)
Glucose-Capillary: 210 mg/dL — ABNORMAL HIGH (ref 70–99)
Glucose-Capillary: 194 mg/dL — ABNORMAL HIGH (ref 70–99)
Glucose-Capillary: 163 mg/dL — ABNORMAL HIGH (ref 70–99)

## 2021-03-11 LAB — PHOSPHORUS: Phosphorus: 3.7 mg/dL (ref 2.5–4.6)

## 2021-03-11 NOTE — TOC Progression Note (Signed)
Transition of Care [facility]) - Progression Note    Patient Details  Name: Jennifer [name] MRN: [id number] Date of Birth: Jul 31, 1970  [facility] CM/SW Contact  Cecil Cobbs Phone Number: 03/11/2021, 2:57 PM  Clinical Narrative:     Per Physician, patient medically ready for discharge to CIR.  CSW updated CIR admissions to review patient.  TOC to continue to follow patient's progress throughout discharge planning.        Expected Discharge Plan and Services           Expected Discharge Date:  (unknown)                                     Social Determinants of Health (SDOH) Interventions    Readmission Risk Interventions Readmission Risk Prevention Plan 04/12/2020  Transportation Screening Complete  PCP or Specialist Appt within 3-5 Days Complete  HRI or [place] Complete  Social Work Consult for [place] Planning/Counseling Complete  Palliative Care Screening Not Applicable  Medication Review Press photographer) Complete  Some recent data might be hidden

## 2021-03-11 NOTE — Progress Notes (Signed)
PROGRESS NOTE    Jennifer [name]  XFG:[id number] DOB: April 05, 1971 DOA: 03/06/2021 PCP: Jose Persia, MD  Brief Narrative:  The patient is a 50 year old Arabic female with a past medical history significant for but not limited to atrial fibrillation on anticoagulation with Eliquis, history of left eye visual impairment, hypertension, hyperlipidemia as well as diabetes mellitus type 2 who was recently admitted to [facility] from 02/25/2021 until 03/05/2021 for multifocal CVAs who now presents to the hospital for rectal bleeding.  She states that she is okay when she left the hospital the day before yesterday but when she got home her stomach was hurting and she complained about this in the hospital and told that it might be for medications.  He states that it started around 2:30 PM and she states that she started having 3-4 stools overnight and noticed blood in the stool by the end she had frank blood clots last time.  She was having left flank pain and her last dose of Eliquis was yesterday morning and did not take any medications last night.  Upon arrival to the ED she had 4 bloody stools and has a Foley catheter for retention.  She is heme positive and GI was consulted.  CT of the abdomen pelvis showed concern for colitis and proctitis so she is started on IV Rocephin and Flagyl.  Rectal bleeding has improved however hemoglobin is dropped a little bit but is relatively stable and slightly improved.  GI evaluated and recommending placing this patient on soft diet.  If she tolerates a soft diet without issues she can be discharged home however she is significantly weak so we will obtain a PT and OT to further evaluate and they are recommending CIR admission.  We have discontinued her Foley and will be checking for residuals she is voiding well.  Because of her IV issues today we will change her to p.o. cefdinir and Flagyl.  She is medically stable to D/C to CIR but currently awaiting Acceptance.   . Assessment & Plan:   Principal Problem:   Acute proctitis Active Problems:   Type II diabetes mellitus (HCC)   Atrial fibrillation (HCC)   Blind left eye   CKD (chronic kidney disease) stage 3, GFR 30-59 ml/min (HCC)   Brain lesion   Acute lower GI bleeding   Glaucoma   Acute urinary retention   At risk for medication noncompliance   Proctitis/Colitis with GI bleeding, improved  -She was hospitalized from 02/25/21 until 03/05/2021 and discharged yesterday -She developed abdominal pain prior to discharge and then had recurrent bloody stools overnight -CT scan showed "Prominent edematous wall thickening in the rectum with perirectal and presacral edema. Imaging features suggest infectious/inflammatory proctitis. There is probably some associated wall thickening in the site distal sigmoid colon in features may reflect a left-sided infectious/inflammatory colitis. Foley catheter decompresses the urinary bladder. Gas in the bladder lumen is compatible with the instrumentation." -She is admitted for ongoing management of her abdominal pain and bloody bowel movement have improved -She was initiated on IV Cipro Flagyl and was changed to ceftriaxone and Flagyl will continue and GI recommends 10-day total course of Cipro Flagyl for possible infectious proctocolitis but will also change to p.o. cefdinir Flagyl for total 10-day course -Hemoglobin has dropped and now went from 10.2/32.6 -> 9.0/31.4  -> 8.2/26.4 -> 8.4/27.6 -> 8.6/27.7 -> 8.2/26.8 and today it was 8.8/28.1 -> 8.2/26.5 -Continue to hold Eliquis for now for 5 days total from a neurological standpoint -She  is initiated on a clear liquid diet for now and will need further diet advancement per GI and she was advanced to a soft diet and she is tolerating this well -Patient is having some diarrhea and abdominal cramping but no real blood -GI feels that this is a difficult case given her recent CVA and they recommended her for bleeding so  long they would be reluctant to do a colonoscopy on the other hand if her worsens they can do a sigmoidoscopy versus minimal or no sedation. -GI recommends holding Eliquis for the next 5 days if at all possible but if felt to be a high risk from stroke recurrence standpoint consider IV heparin and see how she does however I spoke with Dr. Leonel Ramsay who recommends no anticoagulation given her recent bleeding -IVF -C/w Supportive Care with Antiemtics with Ondansetron 4 mg po/IV q6hprn Nausea -Nutritionist has been consulted and recommending Glucerna shake once a day as well as Ensure max once a day as well as 30 mL of Prosource plus once a day and multivitamin with minerals daily -Discussed with neurology Dr. Leonel Ramsay who recommends that the risks of anticoagulation outweigh the benefits currently and recommends resuming once okay from a GI standpoint.   Prior CVA/abnormal MRI with ? Brain Metastases  -She was seen by neurology during the prior hospitalization -She is scheduled for repeat imaging in 4 weeks with plan for close neurology outpatient follow-up -There is concern for severely uncontrolled vascular factors noted and appears that this may be her primary issue -Unfortunately we will continue to hold Eliquis for now given her GI bleeding and GI recommends for 5 days; may consider resuming anticoagulation with heparin drip but will hold off and resume anticoagulation in outpatient setting -Discussed with neurology -We will obtain PT OT to further evaluate and treat given that she is significantly weak and they are recommending CIR; Rehab Admissions Coordinator consulted for further evaluation and awaiting for acceptance  Atrial fibrillation -She was previously on metoprolol but this was held during last admission due to soft blood pressure and she is to restart slowly -Currently holding anticoagulation of the cause given her GI bleeding and recommended resuming in the AM as the 5 days will  have passed  Uncontrolled diabetes mellitus type 2 with hyperglycemia -Globin AC was 12.1 indicating very poor control -She is initiated on Levemir which we will continue at 12 units  -We will continue to hold Tradjenta and metformin -Continue with Moderate NovoLog/scale insulin before meals and at bedtime -She is currently taking gabapentin 300 mg p.o. nightly -CBGs ranging from 88-210  HTN -Continue Amlodipine 2.5 mg pO BID -Continue to Monitor BP per Protocol -C/w Hydralazine 5 mg IV q4hprn High Blood Pressure  -Last BP reading was 130/63  Stage 3a CKD -She had an AKI during her prior hospitalization which is now resolved -Creatinine appears to be her baseline yesterday her BUN/creatinine was 8/0.19 -> 17/1.16 -> 17/0.95 -> 20/0.92 -> 22/0.91 -Getting IVF as above and have now stopped -Avoid nephrotoxic medications, contrast dyes, hypotension and dehydration and renally dose medications -Continue to monitor and trend renal function repeat CMP in a.m.  Glaucoma -She has Left Eye Blindness -Followed by Dr. Manuella Ghazi at [facility] -C/w Brimonidine 1 drop Left eye TID and Dorzolamide-Timolol 22.3-6.8 1 drop Left Eye BID  Urinary Retention  -Has an indwelling Foley with Plan for Outpatient Urology F/U within 1 week to follow-up with him in tomorrow.  We will discontinue Foley catheter for now and see  if she can void on her own given that she was having some vaginal discomfort -Urine Cx Grew 80 Yeast and she was started on 14 Days of Diflucan 200 mg po Daily  (Day 9/14)  Medication Noncompliance  -Her daughter noted confusion about which meds she is supposed to be taking, polypharmacy -Suggest close ongoing discussion and education regarding importance of medications during and after hospitalization -Suggest specific instructions given her language barrier and possible low medical literacy   DVT prophylaxis: SCDs Code Status: FULL CODE  Family Communication: Discussed with daughter at  bedside Disposition Plan: Pending GI Clearance and tolerance of diet as well as evaluation by PT and OT.  Status is: Inpatient  Remains inpatient appropriate because: She needs GI clearance prior to safe discharge disposition  Consultants:  Gastroenterology  CIR  Procedures:  None  Antimicrobials: Anti-infectives (From admission, onward)    Start     Dose/Rate Route Frequency Ordered Stop   03/10/21 1545  metroNIDAZOLE (FLAGYL) tablet 500 mg        500 mg Oral Every 12 hours 03/10/21 1538     03/10/21 1545  cefdinir (OMNICEF) capsule 300 mg        300 mg Oral Every 12 hours 03/10/21 1538     03/07/21 1000  cefTRIAXone (ROCEPHIN) 2 g in sodium chloride 0.9 % 100 mL IVPB  Status:  Discontinued        2 g 200 mL/hr over 30 Minutes Intravenous Every 24 hours 03/06/21 1314 03/10/21 1538   03/06/21 2200  metroNIDAZOLE (FLAGYL) IVPB 500 mg  Status:  Discontinued        500 mg 100 mL/hr over 60 Minutes Intravenous Every 12 hours 03/06/21 1314 03/10/21 1538   03/06/21 1445  fluconazole (DIFLUCAN) tablet 200 mg        200 mg Oral Daily 03/06/21 1314     03/06/21 1030  metroNIDAZOLE (FLAGYL) IVPB 500 mg        500 mg 100 mL/hr over 60 Minutes Intravenous  Once 03/06/21 1021 03/06/21 1226   03/06/21 1030  cefTRIAXone (ROCEPHIN) 1 g in sodium chloride 0.9 % 100 mL IVPB        1 g 200 mL/hr over 30 Minutes Intravenous  Once 03/06/21 1021 03/06/21 1226        Subjective: Seen and examined at bedside with assistance of the AMN translator Amy #[id number].  She states that she is feeling better today than she was yesterday.  States that she is had a few more bowel movements but this is improving.  States her abdominal cramping has improved.  Denies any lightheadedness or dizziness.  No other concerns or complaints at this time.  Objective: Vitals:   03/10/21 2103 03/11/21 0529 03/11/21 0944 03/11/21 1226  BP: (!) 147/68 117/64 131/65 130/63  Pulse: 88 73 79 75  Resp: 18 18  18   Temp: 98.5  F (36.9 C) 98.6 F (37 C)  99.7 F (37.6 C)  TempSrc: Oral Oral  Oral  SpO2: 100% 98%  96%  Weight:      Height:        Intake/Output Summary (Last 24 hours) at 03/11/2021 1302 Last data filed at 03/10/2021 2200 Gross per 24 hour  Intake --  Output 400 ml  Net -400 ml    Filed Weights   03/06/21 2100  Weight: 68.7 kg   Examination: Physical Exam:  Constitutional: WN/WD Arabic female in no acute distress appears calm and comfortable Eyes: Lids are normal ENMT:  External Ears, Nose appear normal. Grossly normal hearing. Mucous membranes are moist. Neck: Appears normal, supple, no cervical masses, normal ROM, no appreciable thyromegaly; no appreciable JVD Respiratory: Clear to auscultation bilaterally, no wheezing, rales, rhonchi or crackles. Normal respiratory effort and patient is not tachypenic. No accessory muscle use.  Cardiovascular: RRR, no murmurs / rubs / gallops. S1 and S2 auscultated. No extremity edema. 2+ pedal pulses. No carotid bruits.  Abdomen: Soft, non-tender, non-distended.  Bowel sounds positive.  GU: Deferred. Musculoskeletal: No clubbing / cyanosis of digits/nails. No joint deformity upper and lower extremities.  Skin: No rashes, lesions, ulcers on a limited skin evaluation. No induration; Warm and dry.  Neurologic: CN 2-12 grossly intact with no focal deficits. Romberg sign and cerebellar reflexes not assessed.  Psychiatric: Normal judgment and insight. Alert and oriented x 3. Normal mood and appropriate affect.   Data Reviewed: I have personally reviewed following labs and imaging studies  CBC: Recent Labs  Lab 03/06/21 0601 03/06/21 1700 03/07/21 1743 03/08/21 0435 03/09/21 0424 03/10/21 0427 03/11/21 0517  WBC 6.1   < > 5.6 4.8 5.2 6.1 5.6  NEUTROABS 4.1  --   --  2.1 2.6 3.4 2.9  HGB 10.2*   < > 8.4* 8.6* 8.2* 8.8* 8.2*  HCT 32.6*   < > 27.6* 27.7* 26.8* 28.1* 26.5*  MCV 89.1   < > 89.0 89.4 89.3 90.1 89.8  PLT 253   < > 248 217 216 215  203   < > = values in this interval not displayed.    Basic Metabolic Panel: Recent Labs  Lab 03/07/21 0446 03/08/21 0435 03/09/21 0424 03/10/21 0427 03/11/21 0517  NA 141 138 140 140 140  K 4.0 4.2 4.0 3.9 4.1  CL 109 107 110 108 109  CO2 27 26 26 26 27   GLUCOSE 83 111* 157* 175* 97  BUN 18 17 17 20  22*  CREATININE 1.09* 1.16* 0.95 0.92 0.91  CALCIUM 8.7* 8.6* 8.4* 8.7* 8.5*  MG  --  2.0 2.0 2.1 2.0  PHOS  --  4.0 3.3 2.7 3.7   GFR: Estimated Creatinine Clearance: 72.7 mL/min (by C-G formula based on SCr of 0.91 mg/dL). Liver Function Tests: Recent Labs  Lab 03/06/21 0601 03/08/21 0435 03/09/21 0424 03/10/21 0427 03/11/21 0517  AST 17 16 17 20 15   ALT 18 11 13 14 12   ALKPHOS 107 78 89 99 82  BILITOT 0.4 0.3 0.1* 0.2* 0.4  PROT 7.5 6.2* 5.9* 6.3* 6.0*  ALBUMIN 3.1* 2.6* 2.7* 2.9* 2.7*   No results for input(s): LIPASE, AMYLASE in the last 168 hours. No results for input(s): AMMONIA in the last 168 hours.  Coagulation Profile: Recent Labs  Lab 03/06/21 0601  INR 1.1   Cardiac Enzymes: No results for input(s): CKTOTAL, CKMB, CKMBINDEX, TROPONINI in the last 168 hours.  BNP (last 3 results) No results for input(s): PROBNP in the last 8760 hours. HbA1C: No results for input(s): HGBA1C in the last 72 hours. CBG: Recent Labs  Lab 03/10/21 1131 03/10/21 1634 03/10/21 2100 03/11/21 0721 03/11/21 1223  GLUCAP 131* 165* 199* 88 210*   Lipid Profile: No results for input(s): CHOL, HDL, LDLCALC, TRIG, CHOLHDL, LDLDIRECT in the last 72 hours. Thyroid Function Tests: No results for input(s): TSH, T4TOTAL, FREET4, T3FREE, THYROIDAB in the last 72 hours. Anemia Panel: No results for input(s): VITAMINB12, FOLATE, FERRITIN, TIBC, IRON, RETICCTPCT in the last 72 hours. Sepsis Labs: No results for input(s): PROCALCITON, LATICACIDVEN in the last 168  hours.  Recent Results (from the past 240 hour(s))  Fungus culture, blood     Status: None (Preliminary result)    Collection Time: 03/01/21  2:26 PM   Specimen: BLOOD  Result Value Ref Range Status   Specimen Description BLOOD RIGHT ANTECUBITAL  Final   Special Requests   Final    BOTTLES DRAWN AEROBIC ONLY Blood Culture adequate volume   Culture   Final    NO FUNGUS ISOLATED AFTER 7 DAYS Performed at [facility], [address] [place], [place] [address]    Report Status PENDING  Incomplete  CSF culture w Gram Stain     Status: None   Collection Time: 03/01/21  4:50 PM   Specimen: CSF; Cerebrospinal Fluid  Result Value Ref Range Status   Specimen Description CSF  Final   Special Requests NONE  Final   Gram Stain   Final    WBC PRESENT, PREDOMINANTLY MONONUCLEAR NO ORGANISMS SEEN CYTOSPIN SMEAR    Culture   Final    NO GROWTH 3 DAYS Performed at [facility], [place] [address], [place], [place] [address]    Report Status 03/05/2021 FINAL  Final  Fungus Culture With Stain     Status: None (Preliminary result)   Collection Time: 03/01/21  4:50 PM  Result Value Ref Range Status   Fungus Stain Final report  Final    Comment: (NOTE) Performed At: [facility] [place], [place] [id number] Rush Farmer MD [id number]    Fungus (Mycology) Culture PENDING  Incomplete   Fungal Source CSF  Final    Comment: Performed at [facility], [place] [address], [place], [place] [address]  Acid Fast Smear (AFB)     Status: None   Collection Time: 03/01/21  4:50 PM   Specimen: Cerebrospinal Fluid  Result Value Ref Range Status   AFB Specimen Processing Concentration  Final   Acid Fast Smear Negative  Final    Comment: (NOTE) Performed At: [facility] [place], [place] [id number] Rush Farmer MD SJ:[id number]    Source (AFB) CSF  Final    Comment: Performed at [facility], [place] [address], [place], [place] [address]  Fungus Culture Result     Status: None   Collection Time: 03/01/21  4:50 PM  Result Value Ref Range Status    Result 1 Comment  Final    Comment: (NOTE) KOH/Calcofluor preparation:  no fungus observed. Performed At: [facility] [place], [place] [id number] Rush Farmer MD EX:[id number]   Andee Poles, PCR     Status: None   Collection Time: 03/01/21  8:26 PM  Result Value Ref Range Status   Toxoplasma Gondii, PCR Negative Negative Final    Comment: (NOTE) No Toxoplasma gondii DNA detected. This test was developed and its performance characteristics determined by [employer]. It has not been cleared or approved by the U.S. Food and Drug Administration. The FDA has determined that such clearance or approval is not necessary. This test is used for clinical purposes. It should not be regarded as investigational or research. Performed At: [facility] [place], [place] [id number] Rush Farmer MD [id number]   Aspergillus Ag, BAL/Serum     Status: None   Collection Time: 03/01/21  8:26 PM  Result Value Ref Range Status   Aspergillus Ag, BAL/Serum 0.04 0.00 - 0.49 Index Final    Comment: (NOTE) Performed At: [facility] [address] [address]  [place] [id number] Rush Farmer MD [id number]   Resp Panel by RT-PCR (Flu A&B, Covid) Nasopharyngeal Swab     Status: None   Collection Time: 03/06/21 11:58 AM   Specimen: Nasopharyngeal Swab; Nasopharyngeal(NP) swabs in vial transport medium  Result Value Ref Range Status   SARS Coronavirus 2 by RT PCR NEGATIVE NEGATIVE Final    Comment: (NOTE) SARS-CoV-2 target nucleic acids are NOT DETECTED.  The SARS-CoV-2 RNA is generally detectable in upper respiratory specimens during the acute phase of infection. The lowest concentration of SARS-CoV-2 viral copies this assay can detect is 138 copies/mL. A negative result does not preclude SARS-Cov-2 infection and should not be used as the sole basis for treatment or other patient management decisions. A negative result may occur  with  improper specimen collection/handling, submission of specimen other than nasopharyngeal swab, presence of viral mutation(s) within the areas targeted by this assay, and inadequate number of viral copies(<138 copies/mL). A negative result must be combined with clinical observations, patient history, and epidemiological information. The expected result is Negative.  Fact Sheet for Patients:  EntrepreneurPulse.com.au  Fact Sheet for Healthcare Providers:  IncredibleEmployment.be  This test is no t yet approved or cleared by the [place] FDA and  has been authorized for detection and/or diagnosis of SARS-CoV-2 by FDA under an Emergency Use Authorization (EUA). This EUA will remain  in effect (meaning this test can be used) for the duration of the COVID-19 declaration under Section 564(b)(1) of the Act, 21 U.S.C.section 360bbb-3(b)(1), unless the authorization is terminated  or revoked sooner.       Influenza A by PCR NEGATIVE NEGATIVE Final   Influenza B by PCR NEGATIVE NEGATIVE Final    Comment: (NOTE) The Xpert Xpress SARS-CoV-2/FLU/RSV plus assay is intended as an aid in the diagnosis of influenza from Nasopharyngeal swab specimens and should not be used as a sole basis for treatment. Nasal washings and aspirates are unacceptable for Xpert Xpress SARS-CoV-2/FLU/RSV testing.  Fact Sheet for Patients: EntrepreneurPulse.com.au  Fact Sheet for Healthcare Providers: IncredibleEmployment.be  This test is not yet approved or cleared by the [place] FDA and has been authorized for detection and/or diagnosis of SARS-CoV-2 by FDA under an Emergency Use Authorization (EUA). This EUA will remain in effect (meaning this test can be used) for the duration of the COVID-19 declaration under Section 564(b)(1) of the Act, 21 U.S.C. section 360bbb-3(b)(1), unless the authorization is terminated  or revoked.  Performed at [facility], [place] [address] [place], [place] [address]   Resp Panel by RT-PCR (Flu A&B, Covid) Nasopharyngeal Swab     Status: None   Collection Time: 03/09/21  2:56 PM   Specimen: Nasopharyngeal Swab; Nasopharyngeal(NP) swabs in vial transport medium  Result Value Ref Range Status   SARS Coronavirus 2 by RT PCR NEGATIVE NEGATIVE Final    Comment: (NOTE) SARS-CoV-2 target nucleic acids are NOT DETECTED.  The SARS-CoV-2 RNA is generally detectable in upper respiratory specimens during the acute phase of infection. The lowest concentration of SARS-CoV-2 viral copies this assay can detect is 138 copies/mL. A negative result does not preclude SARS-Cov-2 infection and should not be used as the sole basis for treatment or other patient management decisions. A negative result may occur with  improper specimen collection/handling, submission of specimen other than nasopharyngeal swab, presence of viral mutation(s) within the areas targeted by this assay, and inadequate number of viral copies(<138 copies/mL). A negative result must be combined with clinical observations, patient  history, and epidemiological information. The expected result is Negative.  Fact Sheet for Patients:  EntrepreneurPulse.com.au  Fact Sheet for Healthcare Providers:  IncredibleEmployment.be  This test is no t yet approved or cleared by the [place] FDA and  has been authorized for detection and/or diagnosis of SARS-CoV-2 by FDA under an Emergency Use Authorization (EUA). This EUA will remain  in effect (meaning this test can be used) for the duration of the COVID-19 declaration under Section 564(b)(1) of the Act, 21 U.S.C.section 360bbb-3(b)(1), unless the authorization is terminated  or revoked sooner.       Influenza A by PCR NEGATIVE NEGATIVE Final   Influenza B by PCR NEGATIVE NEGATIVE Final    Comment: (NOTE) The  Xpert Xpress SARS-CoV-2/FLU/RSV plus assay is intended as an aid in the diagnosis of influenza from Nasopharyngeal swab specimens and should not be used as a sole basis for treatment. Nasal washings and aspirates are unacceptable for Xpert Xpress SARS-CoV-2/FLU/RSV testing.  Fact Sheet for Patients: EntrepreneurPulse.com.au  Fact Sheet for Healthcare Providers: IncredibleEmployment.be  This test is not yet approved or cleared by the [place] FDA and has been authorized for detection and/or diagnosis of SARS-CoV-2 by FDA under an Emergency Use Authorization (EUA). This EUA will remain in effect (meaning this test can be used) for the duration of the COVID-19 declaration under Section 564(b)(1) of the Act, 21 U.S.C. section 360bbb-3(b)(1), unless the authorization is terminated or revoked.  Performed at [facility], [place] [address], [place], [place] [address]      RN Pressure Injury Documentation:     Estimated body mass index is 23.72 kg/m as calculated from the following:   Height as of this encounter: 5\' 7"  (1.702 m).   Weight as of this encounter: 68.7 kg.  Malnutrition Type: Nutrition Problem: Increased nutrient needs Etiology: acute illness Malnutrition Characteristics: Signs/Symptoms: estimated needs Nutrition Interventions: Interventions: Glucerna shake, Premier Protein, Prostat, MVI  Radiology Studies: No results found.  Scheduled Meds:  (feeding supplement) PROSource Plus  30 mL Oral Daily   amLODipine  2.5 mg Oral BID   brimonidine  1 drop Left Eye TID   cefdinir  300 mg Oral Q12H   Chlorhexidine Gluconate Cloth  6 each Topical Daily   dorzolamide-timolol  1 drop Left Eye BID   feeding supplement (GLUCERNA SHAKE)  237 mL Oral Q24H   fluconazole  200 mg Oral Daily   gabapentin  300 mg Oral QHS   insulin aspart  0-15 Units Subcutaneous TID WC   insulin aspart  0-5 Units Subcutaneous QHS   insulin  detemir  15 Units Subcutaneous QHS   metroNIDAZOLE  500 mg Oral Q12H   multivitamin with minerals  1 tablet Oral Daily   Ensure Max Protein  11 oz Oral Daily   sodium chloride flush  3 mL Intravenous Q12H   Continuous Infusions:   LOS: 5 days   [place], DO [facility] PAGER is on AMION  If 7PM-7AM, please contact night-coverage [email or web address]

## 2021-03-11 NOTE — PMR Pre-admission (Signed)
PMR Admission Coordinator Pre-Admission Assessment  Patient: Jennifer [name] is an 50 y.o., female MRN: [id number] DOB: [birth year] Height: 5\' 7"  (170.2 cm) Weight: 74.2 kg  Insurance Information HMO:     PPO:      PCP:      IPA:      80/20:      OTHER:  PRIMARY: [place] Medicaid [employer] Community      Policy#: [id number]      Subscriber: patient CM Name: Annamary Carolin      Phone#: [phone]     Fax#: [phone] I received a call from [facility] at [facility] granting approval on 03/13/21 for 7 days with updates due 7 days from admission.  Pre-Cert#: [id number]      Employer:  Benefits:  Phone #: [email or web address]     Name:  Eff. Date: 10/20/20-still active     Deduct: $0 (does not have deductible)      Out of Pocket Max: $0      Life Max: NA CIR: 100% coverage      SNF: 100% first 90 days Outpatient: 100% coverage for 27 visits per calendar year across all therapy disciplines combined     Co-Pay:  Home Health: 100% , [employer] aides limited to 100 visits per year     Co-Pay:  DME: 100%     Co-Pay:  Providers: in-network SECONDARY:       Policy#:      Phone#:   Development worker, community:       Phone#:   The Engineer, petroleum" for patients in Inpatient Rehabilitation Facilities with attached "Privacy Act [place] Records" was provided and verbally reviewed with: Patient  Emergency Contact Information Contact Information     Name Relation Home Work Mobile   Krasowski,abdel Son [phone]  [phone]   [facility] Daughter [phone]     Elana, Jian [phone]     Rahmah, Mccamy [phone]  [phone]   Unice Bailey [phone]     leolia, vinzant   [phone]       Current Medical History  Patient Admitting Diagnosis: debility d/t acute proctitis with GI bleeding History of Present Illness: Jennifer [name] is a 50 year old right-handed limited English speaking female with complicated medical history of atrial  fibrillation maintained on Eliquis, non-STEMI 10/22, left eye visual impairment/glaucoma followed by [facility], hypertension, hyperlipidemia, diabetes mellitus, COVID-19 06/15/2020, closed trimalleolar right ankle fracture 05/02/2020, medical noncompliance.  Recent admission was a long hospital 02/25/2021 to 03/05/2021 for multifocal CVA with concern for infectious/inflammatory etiology as well as possible enhancing occipital lobe lesion and did receive lumbar puncture and ruled out meningitis as well as subarachnoid hemorrhage.  Hospital course complicated by urinary retention as well as AKI.  MRI of the brain study 02/28/2021 showed prominent diffuse FLAIR hyperintensity within the cerebral sulci representing proteinaceous material related to meningitis or less likely subarachnoid hemorrhage.  Multiple punctate  infarcts bilateral cerebral hemispheres noted and cerebellar hemisphere and an unchanged appearance of 2 enhancing lesions in the left occipital lobe representing possible subacute infarct with hemorrhagic transformation versus malignancy.  Plan was for rescheduling of repeat imaging in approximately 4 weeks with neurology follow-up as there are concerns for severely uncontrolled vascular factors..  Per chart review patient lives with her husband and children.  Uses a rolling walker for mobility.  Required some assist for lower body dressing.  1 level apartment with level entry.  Multiple family members in and out during the day to assist.  Presented 03/06/2021 with rectal bleeding and frank  blood clots.  She was having some left flank pain.  CT of the abdomen pelvis showed prominent edematous wall thickening in the rectum with perirectal and presacral edema.  Imaging suggesting infectious/inflammatory proctitis.  Gastroenterology services consulted Dr. Paulita Fujita.  Patient was placed on IV Cipro and Flagyl changed to ceftriaxone and Flagyl and later to [facility] and Flagyl x10-day course.  Admission labs  hemoglobin 10.2 compared to 8.79 days prior, glucose 181, BUN 22, creatinine 1.15, fecal occult blood positive.  Conservative care per gastroenterology services no current plan for colonoscopy.  Patient's Eliquis initially was held discussed with neurology service Dr. Leonel Ramsay as well as gastroenterology services and was later cleared to resume Eliquis 03/12/2021.  Monitored for blood loss latest hemoglobin 8.2.  In regards to patient's AKI responded well to IV fluids and latest creatinine 1.00.  Patient's urinary retention continued to improve Foley catheter tube removed.  Urine culture did grow 80,000 yeast received 14 days of Diflucan.  Uncontrolled diabetes mellitus hemoglobin A1c 12.1 insulin therapy as directed.  She did have bouts of hypotension and antihypertensive medications were adjusted.  Therapy evaluations completed due to patient decreased functional mobility was to be admitted for a comprehensive rehab program. Complete NIHSS TOTAL: 3  Patient's medical record from [facility] has been reviewed by the rehabilitation admission coordinator and physician.  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 No  Family History   family history includes Diabetes in her father; Hypertension in an other family member.  Current Medications  Current Facility-Administered Medications:    0.9 %  sodium chloride infusion, , Intravenous, Continuous, Rizwan, Saima, MD, Last Rate: 100 mL/hr at 03/16/21 1045, New Bag at 03/16/21 1045   acetaminophen (TYLENOL) tablet 650 mg, 650 mg, Oral, Q6H PRN, 650 mg at 03/10/21 7673 **OR** acetaminophen (TYLENOL) suppository 650 mg, 650 mg, Rectal, Q6H PRN, Karmen Bongo, MD   amLODipine (NORVASC) tablet 2.5 mg, 2.5 mg, Oral, BID, Karmen Bongo, MD, 2.5 mg at 03/16/21 0943   apixaban (ELIQUIS) tablet 5 mg, 5 mg, Oral, BID, Sheikh, Omair Latif, DO, 5 mg at 03/16/21 0943   brimonidine (ALPHAGAN) 0.2 % ophthalmic solution 1 drop, 1 drop, Left Eye, TID, Karmen Bongo, MD, 1 drop at 03/16/21 0946   dorzolamide-timolol (COSOPT) 22.3-6.8 MG/ML ophthalmic solution 1 drop, 1 drop, Left Eye, BID, Karmen Bongo, MD, 1 drop at 03/16/21 0946   gabapentin (NEURONTIN) capsule 300 mg, 300 mg, Oral, QHS, Karmen Bongo, MD, 300 mg at 03/15/21 2134   hydrALAZINE (APRESOLINE) injection 5 mg, 5 mg, Intravenous, Q4H PRN,  Karmen Bongo, MD   insulin aspart (novoLOG) injection 0-15 Units, 0-15 Units, Subcutaneous, TID WC, Karmen Bongo, MD, 3 Units at 03/16/21 1238   insulin aspart (novoLOG) injection 0-5 Units, 0-5 Units, Subcutaneous, QHS, Karmen Bongo, MD, 3 Units at 03/14/21 2148   insulin detemir (LEVEMIR) injection 18 Units, 18 Units, Subcutaneous, QHS, Raiford Noble [place], [place], [place] Units at 03/15/21 2134   morphine 2 MG/ML injection 2 mg, 2 mg, Intravenous, Q2H PRN, Karmen Bongo, MD   multivitamin with minerals tablet 1 tablet, 1 tablet, Oral, Daily, Raiford Noble Latif, DO, 1 tablet at 03/16/21 0942   ondansetron (ZOFRAN) tablet 4 mg, 4 mg, Oral, Q6H PRN **OR** ondansetron (ZOFRAN) injection 4 mg,  4 mg, Intravenous, Q6H PRN, Karmen Bongo, MD   sodium chloride flush (NS) 0.9 % injection 3 mL, 3 mL, Intravenous, Q12H, Karmen Bongo, MD, 3 mL at 03/16/21 1047  Patients Current Diet:  Diet Order             DIET SOFT Room service appropriate? Yes; Fluid consistency: Thin  Diet effective now                   Precautions / Restrictions Precautions Precautions: Fall Precaution Comments: interpreter (video), L eye blindness Restrictions Weight Bearing Restrictions: No   Has the patient had 2 or more falls or a fall with injury in the past year? Yes  Prior Activity Level Limited Community (1-2x/wk): drives, gets out of house 2-3 days/week  Prior Functional Level Self Care: Did the patient need help bathing, dressing, using the toilet or eating? Needed some help  Indoor Mobility: Did the patient need assistance with walking from room to room (with or without device)? Independent  Stairs: Did the patient need assistance with internal or external stairs (with or without device)? Per daughter, pt doesn't typically utilize stairs  Functional Cognition: Did the patient need help planning regular tasks such as shopping or remembering to take medications? Needed some help  Patient Information Are you of Hispanic, Latino/a,or Spanish origin?: X. Patient unable to respond, A. No, not of Hispanic, Latino/a, or Spanish origin What is your race?: X. Patient unable to respond, Z. None of the above Do you need or want an interpreter to communicate with a doctor or health care staff?: 1. Yes  Patient's Response To:  Health Literacy and Transportation Is the patient able to respond to health literacy and transportation needs?: No Health Literacy - How often do you need to have someone help you when you read instructions, pamphlets, or other written material from your doctor or pharmacy?: Patient unable to respond  [place] Devices/Equipment:  Bedside commode/3-in-1, Environmental consultant (specify type), Wheelchair, Shower chair with back, Other (Comment), CBG Meter (front wheeled walker, manual wheelchair, tub bench, tub/shower unit, handicap height toilet) Home Equipment: Conservation officer, nature (2 wheels), Wheelchair - manual, BSC/3in1  Prior Device Use: Indicate devices/aids used by the patient prior to current illness, exacerbation or injury? Manual wheelchair and Walker  Current Functional Level Cognition  Overall Cognitive Status: Within Functional Limits for tasks assessed Difficult to assess due to: Non-English speaking Orientation Level: Oriented X4 Safety/Judgement: Decreased awareness of safety, Decreased awareness of deficits General Comments: attempts to sit before backing up to recliner, decr safety, decr awareness of RLE beign outside RW    Extremity Assessment (includes Sensation/Coordination)  Upper Extremity Assessment: Overall WFL for tasks assessed RUE Deficits / Details: WNL ROM, shoulder strength 4-/5, bicep 4+/5, rist 5/5, grip 4-/5;  intrinsic muscle wasting noted. RUE Sensation: history of peripheral neuropathy RUE Coordination:  (WFL; able to perform finger to thumb and finger to nose.) LUE Deficits / Details: WNL ROM, shoulder 4-/5, bicep 4+/5, wrist 4-/5, grip 4-/5, intrinsic muscle wasting noted LUE Sensation: history of peripheral neuropathy LUE Coordination:  (able to perform finger to thumb and finger to nose.)  Lower Extremity Assessment: RLE deficits/detail, LLE deficits/detail RLE Deficits / Details: hip flexion 2-/5, hip abd/add 3/5, knee flexion/extension 4/5, ankle PF 2/5, ankle DF 1/5 RLE Sensation: WNL RLE Coordination:  (WFL) LLE Deficits / Details: hip flexion 3+/5, hip abd/add 3+/5, knee flexion/extension 4/5, ankle PF 3/5, ankle DF 2-/5 LLE Sensation: WNL LLE Coordination:  (WFL)    ADLs  Overall ADL's : Needs assistance/impaired Eating/Feeding: Set up, Sitting Grooming: Min guard, Standing, Wash/dry  hands Grooming Details (indicate cue type and reason): at sink, propping on sink (when asked why she was propping she reported she was fearful of falling and always props) Upper Body Bathing: Set up, Sitting Lower Body Bathing: Minimal assistance, Sit to/from stand Upper Body Dressing : Sitting, Set up Upper Body Dressing Details (indicate cue type and reason): min assist for fasteners Lower Body Dressing: Minimal assistance Lower Body Dressing Details (indicate cue type and reason): min A sit<>stand Toilet Transfer: Minimal assistance, Ambulation, Rolling walker (2 wheels), Grab bars, Regular Toilet Toilet Transfer Details (indicate cue type and reason): with increased time and education to keep walker with her with turning. Toileting- Water quality scientist and Hygiene: Min guard, Sit to/from stand Toileting - Water quality scientist Details (indicate cue type and reason): patient needed min guard with education to keep RW close. Functional mobility during ADLs: Min guard, Rolling walker (2 wheels) General ADL Comments: Reliance on walker with ADls limits upper extremity use for ADLs.    Mobility  Overal bed mobility: Needs Assistance Bed Mobility: Supine to Sit Supine to sit: Supervision Sit to supine: Min assist General bed mobility comments: for safety, cues to remain seated and not stand without assist    Transfers  Overall transfer level: Needs assistance Equipment used: Rolling walker (2 wheels) Transfers: Sit to/from Stand Sit to Stand: Min assist General transfer comment: Cues for safety, hand placement. Assist with anterior-superior wt shift and to steady on transition to RW    Ambulation / Gait / Stairs / Emergency planning/management officer  Ambulation/Gait Ambulation/Gait assistance: Herbalist (Feet): 120 Feet Assistive device: Rolling walker (2 wheels) Gait Pattern/deviations: Step-through pattern, Step-to pattern, Decreased stride length, Decreased dorsiflexion - right,  Decreased dorsiflexion - left General Gait Details: R foot drop with  hip external rotation, minimal L dorsiflexion with foot clearance. frequent visual cues to incr step length on R and bring RLE inside frame of RW. drifting to R, requiring min assist to compensate/maneuver RW Gait velocity: decreased    Posture / Balance Dynamic Sitting Balance Sitting balance - Comments: seated EOB Balance Overall balance assessment: Needs assistance Sitting-balance support: No upper extremity supported, Feet supported Sitting balance-Leahy Scale: Good Sitting balance - Comments: seated EOB Standing balance support: Reliant on assistive device for balance, Bilateral upper extremity supported Standing balance-Leahy Scale: Poor Standing balance comment: proppped on sink to wash hands    Special needs/care consideration Skin Abrasion: foot/left; Rash: labia/bilateral and Diabetic management Levemir: 15 units daily at bedtime; novoLOG 0-15 units 3x daily at meals , novoLOG 0-5 units daily at bedtime   Previous Home Environment (from acute therapy documentation) Living Arrangements: Children, Spouse/significant other  Lives With: Son,  Daughter, Spouse Available Help at Discharge: Family, Available 24 hours/day Type of Home: Apartment Home Layout: One level Home Access: Level entry Bathroom Shower/Tub: Chiropodist: Standard Bathroom Accessibility: Yes How Accessible: Accessible via walker Home Care Services: No Additional Comments: acces to RW, and chair for tub (a normal black chair per daughter)  Discharge Living Setting Plans for Discharge Living Setting: Patient's home Type of Home at Discharge: Apartment Discharge Home Layout: One level Discharge Home Access: Level entry Discharge Bathroom Shower/Tub: Tub/shower unit Discharge Bathroom Toilet: Standard Discharge Bathroom Accessibility: Yes How Accessible: Accessible via walker Does the patient have any problems obtaining  your medications?: No  Social/Family/Support Systems Patient Roles: Parent, Spouse Anticipated Caregiver: Runell Gess, daughter, [name], son Anticipated Caregiver's Contact Information: [place]: [phone]: [phone] Caregiver Availability: 24/7 Discharge Plan Discussed with Primary Caregiver: Yes Is Caregiver In Agreement with Plan?: Yes Does Caregiver/Family have Issues with Lodging/Transportation while Pt is in Rehab?: No  Goals Patient/Family Goal for Rehab: PT/OT Supervision Expected length of stay: 7-10 days Pt/Family Agrees to Admission and willing to participate: Yes Program Orientation Provided & Reviewed with Pt/Caregiver Including Roles  & Responsibilities: Yes  Decrease burden of Care through IP rehab admission: NA  Possible need for SNF placement upon discharge: Not anticipated  Patient Condition: I have reviewed medical records from [facility], spoken with CM, and patient and son. I discussed via phone for inpatient rehabilitation assessment.  Patient will benefit from ongoing PT and OT, can actively participate in 3 hours of therapy a day 5 days of the week, and can make measurable gains during the admission.  Patient will also benefit from the coordinated team approach during an Inpatient Acute Rehabilitation admission.  The patient will receive intensive therapy as well as Rehabilitation physician, nursing, social worker, and care management interventions.  Due to safety, skin/wound care, disease management, medication administration, pain management, and patient education the patient requires 24 hour a day rehabilitation nursing.  The patient is currently min with mobility and basic ADLs.  Discharge setting and therapy post discharge at home with home health is anticipated.  Patient has agreed to participate in the Acute Inpatient Rehabilitation Program and will admit today.  Preadmission Screen Completed By:  Bethel Born, 03/16/2021 2:42  PM  with updates by Clemens Catholic, MS, CCC-SLP  ______________________________________________________________________   Discussed status with Dr. Dagoberto Ligas  at 930 and received approval for admission tomorrow, Saturday 03/17/21.  Admission Coordinator:  Bethel Born, Enigma, time 9211 /Date 03/16/21  Assessment/Plan: Diagnosis: Debility Does the need for close, 24 hr/day Medical supervision in concert with the patient's rehab needs make it unreasonable for this patient to be served in a less intensive setting? Yes Co-Morbidities requiring supervision/potential complications: hypertension, acute proctitis, overweight (BMI 25.62), type 2 DM, atrial fibrillation Due to bladder management, bowel management, safety, skin/wound care, disease management, medication administration, pain management, and patient education, does the patient require 24 hr/day rehab nursing? Yes Does the patient require coordinated care of a physician, rehab nurse, PT, OT to address physical and functional deficits in the context of the above medical diagnosis(es)? Yes Addressing deficits in the following areas: balance, endurance, locomotion, strength, transferring, bowel/bladder control, bathing, dressing, feeding, grooming, toileting, and psychosocial support Can the patient actively participate in an intensive therapy program of at least 3 hrs of therapy 5 days a week? Yes The potential for patient to make measurable gains while on inpatient rehab is excellent Anticipated functional outcomes upon discharge  from inpatient rehab: modified independent PT, modified independent OT, independent SLP Estimated rehab length of stay to reach the above functional goals is: 5-7 days Anticipated discharge destination: Home 10. Overall Rehab/Functional Prognosis: excellent   MD Signature: Leeroy Cha, MD

## 2021-03-11 NOTE — Progress Notes (Addendum)
Inpatient Rehab Admissions Coordinator:  Attempted to contact pt again utilizing an interpretor, Chilton Si [phone]. No one answered the phone in pt's room x2 attempts. Called pt's daughter, Runell Gess. Explained CIR goals and expectations to her. She acknowledged understanding. She is supportive of pt pursuing CIR. Will continue to follow.   Gayland Curry, [place], [place] Admissions Coordinator [phone]

## 2021-03-12 ENCOUNTER — Encounter: Payer: Medicaid Other | Admitting: Internal Medicine

## 2021-03-12 LAB — COMPREHENSIVE METABOLIC PANEL
ALT: 13 U/L (ref 0–44)
AST: 23 U/L (ref 15–41)
Albumin: 2.6 g/dL — ABNORMAL LOW (ref 3.5–5.0)
Alkaline Phosphatase: 100 U/L (ref 38–126)
Anion gap: 4 — ABNORMAL LOW (ref 5–15)
BUN: 29 mg/dL — ABNORMAL HIGH (ref 6–20)
CO2: 26 mmol/L (ref 22–32)
Calcium: 8.5 mg/dL — ABNORMAL LOW (ref 8.9–10.3)
Chloride: 107 mmol/L (ref 98–111)
Creatinine, Ser: 1.07 mg/dL — ABNORMAL HIGH (ref 0.44–1.00)
GFR, Estimated: >60 mL/min (ref >60)
Glucose, Bld: 292 mg/dL — ABNORMAL HIGH (ref 70–99)
Potassium: 4.3 mmol/L (ref 3.5–5.1)
Sodium: 137 mmol/L (ref 135–145)
Total Bilirubin: 0.2 mg/dL — ABNORMAL LOW (ref 0.3–1.2)
Total Protein: 5.9 g/dL — ABNORMAL LOW (ref 6.5–8.1)

## 2021-03-12 LAB — CBC WITH DIFFERENTIAL/PLATELET
Abs Immature Granulocytes: 0.02 10*3/uL (ref 0.00–0.07)
Basophils Absolute: 0 10*3/uL (ref 0.0–0.1)
Basophils Relative: 1 %
Eosinophils Absolute: 0.2 10*3/uL (ref 0.0–0.5)
Eosinophils Relative: 3 %
HCT: 28 % — ABNORMAL LOW (ref 36.0–46.0)
Hemoglobin: 8.5 g/dL — ABNORMAL LOW (ref 12.0–15.0)
Immature Granulocytes: 0 %
Lymphocytes Relative: 46 %
Lymphs Abs: 2.4 10*3/uL (ref 0.7–4.0)
MCH: 27.5 pg (ref 26.0–34.0)
MCHC: 30.4 g/dL (ref 30.0–36.0)
MCV: 90.6 fL (ref 80.0–100.0)
Monocytes Absolute: 0.3 10*3/uL (ref 0.1–1.0)
Monocytes Relative: 6 %
Neutro Abs: 2.4 10*3/uL (ref 1.7–7.7)
Neutrophils Relative %: 44 %
Platelets: 195 10*3/uL (ref 150–400)
RBC: 3.09 MIL/uL — ABNORMAL LOW (ref 3.87–5.11)
RDW: 12.2 % (ref 11.5–15.5)
WBC: 5.4 10*3/uL (ref 4.0–10.5)
nRBC: 0 % (ref 0.0–0.2)

## 2021-03-12 LAB — GLUCOSE, CAPILLARY
Glucose-Capillary: 221 mg/dL — ABNORMAL HIGH (ref 70–99)
Glucose-Capillary: 164 mg/dL — ABNORMAL HIGH (ref 70–99)
Glucose-Capillary: 159 mg/dL — ABNORMAL HIGH (ref 70–99)
Glucose-Capillary: 283 mg/dL — ABNORMAL HIGH (ref 70–99)

## 2021-03-12 LAB — MAGNESIUM: Magnesium: 2.1 mg/dL (ref 1.7–2.4)

## 2021-03-12 LAB — FUNGUS CULTURE, BLOOD
Culture: NO GROWTH
Special Requests: ADEQUATE

## 2021-03-12 LAB — PHOSPHORUS: Phosphorus: 3.3 mg/dL (ref 2.5–4.6)

## 2021-03-12 MED ORDER — APIXABAN 5 MG PO TABS
5.0000 mg | ORAL_TABLET | Freq: Two times a day (BID) | ORAL | Status: DC
  Administered 2021-03-12 – 2021-03-17 (×11): 5 mg via ORAL
  Filled 2021-03-12 (×11): qty 1

## 2021-03-12 MED ORDER — SODIUM CHLORIDE 0.9 % IV SOLN: INTRAVENOUS | Status: DC

## 2021-03-12 NOTE — Plan of Care (Signed)

## 2021-03-12 NOTE — Progress Notes (Signed)
PROGRESS NOTE    Jennifer [name]  ATF:[id number] DOB: 1970/12/17 DOA: 03/06/2021 PCP: Jose Persia, MD  Brief Narrative:  The patient is a 50 year old Arabic female with a past medical history significant for but not limited to atrial fibrillation on anticoagulation with Eliquis, history of left eye visual impairment, hypertension, hyperlipidemia as well as diabetes mellitus type 2 who was recently admitted to [facility] from 02/25/2021 until 03/05/2021 for multifocal CVAs who now presents to the hospital for rectal bleeding.  She states that she is okay when she left the hospital the day before yesterday but when she got home her stomach was hurting and she complained about this in the hospital and told that it might be for medications.  He states that it started around 2:30 PM and she states that she started having 3-4 stools overnight and noticed blood in the stool by the end she had frank blood clots last time.  She was having left flank pain and her last dose of Eliquis was yesterday morning and did not take any medications last night.  Upon arrival to the ED she had 4 bloody stools and has a Foley catheter for retention.  She is heme positive and GI was consulted.  CT of the abdomen pelvis showed concern for colitis and proctitis so she is started on IV Rocephin and Flagyl.  Rectal bleeding has improved however hemoglobin is dropped a little bit but is relatively stable and slightly improved.  GI evaluated and recommending placing this patient on soft diet.  If she tolerates a soft diet without issues she can be discharged home however she is significantly weak so we will obtain a PT and OT to further evaluate and they are recommending CIR admission.  We have discontinued her Foley and will be checking for residuals she is voiding well.  Because of her IV issues today we will change her to p.o. cefdinir and Flagyl.  She is medically stable to D/C to CIR but currently awaiting Acceptance. We  have resumed her Anticoagulation 03/12/21 and will monitor for bleeding.  . Assessment & Plan:   Principal Problem:   Acute proctitis Active Problems:   Type II diabetes mellitus (HCC)   Atrial fibrillation (HCC)   Blind left eye   CKD (chronic kidney disease) stage 3, GFR 30-59 ml/min (HCC)   Brain lesion   Acute lower GI bleeding   Glaucoma   Acute urinary retention   At risk for medication noncompliance   Proctitis/Colitis with GI bleeding, improved  -She was hospitalized from 02/25/21 until 03/05/2021 and discharged yesterday -She developed abdominal pain prior to discharge and then had recurrent bloody stools overnight -CT scan showed "Prominent edematous wall thickening in the rectum with perirectal and presacral edema. Imaging features suggest infectious/inflammatory proctitis. There is probably some associated wall thickening in the site distal sigmoid colon in features may reflect a left-sided infectious/inflammatory colitis. Foley catheter decompresses the urinary bladder. Gas in the bladder lumen is compatible with the instrumentation." -She is admitted for ongoing management of her abdominal pain and bloody bowel movement have improved -She was initiated on IV Cipro Flagyl and was changed to ceftriaxone and Flagyl will continue and GI recommends 10-day total course of Cipro Flagyl for possible infectious proctocolitis but will also change to p.o. cefdinir Flagyl for total 10-day course -Hemoglobin has dropped and now went from 10.2/32.6 -> 9.0/31.4  -> 8.2/26.4 -> 8.4/27.6 -> 8.6/27.7 -> 8.2/26.8 and today it was 8.8/28.1 -> 8.2/26.5 -> 8.5/28.0 -Continued to  hold Eliquis for now for 5 days total from a neurological standpoint but will now resume -She is initiated on a clear liquid diet for now and will need further diet advancement per GI and she was advanced to a soft diet and she is tolerating this well -Patient is having some diarrhea and abdominal cramping but no real  blood -GI feels that this is a difficult case given her recent CVA and they recommended her for bleeding so long they would be reluctant to do a colonoscopy on the other hand if her worsens they can do a sigmoidoscopy versus minimal or no sedation. -GI recommends holding Eliquis for the next 5 days if at all possible but if felt to be a high risk from stroke recurrence standpoint consider IV heparin and see how she does however I spoke with Dr. Leonel Ramsay who recommends no anticoagulation given her recent bleeding -IVF -C/w Supportive Care with Antiemtics with Ondansetron 4 mg po/IV q6hprn Nausea -Nutritionist has been consulted and recommending Glucerna shake once a day as well as Ensure max once a day as well as 30 mL of Prosource plus once a day and multivitamin with minerals daily -Discussed with neurology Dr. Leonel Ramsay who recommends that the risks of anticoagulation outweigh the benefits currently and recommends resuming once okay from a GI standpoint.   Prior CVA/abnormal MRI with ? Brain Metastases  -She was seen by neurology during the prior hospitalization -She is scheduled for repeat imaging in 4 weeks with plan for close neurology outpatient follow-up -There is concern for severely uncontrolled vascular factors noted and appears that this may be her primary issue -Unfortunately we will continue to hold Eliquis for now given her GI bleeding and GI recommends for 5 days; may consider resuming anticoagulation with heparin drip but will hold off and resume anticoagulation in outpatient setting -Discussed with neurology -We will obtain PT OT to further evaluate and treat given that she is significantly weak and they are recommending CIR; Rehab Admissions Coordinator consulted for further evaluation and awaiting for acceptance and Insurance Authorization   Atrial fibrillation -She was previously on metoprolol but this was held during last admission due to soft blood pressure and she is to  restart slowly -Currently holding anticoagulation of the cause given her GI bleeding and recommended resuming in the AM as the 5 days will have passed; Will resume AC today with Apixaban  Uncontrolled diabetes mellitus type 2 with hyperglycemia -Globin AC was 12.1 indicating very poor control -She is initiated on Levemir which we will continue at 12 units  -We will continue to hold Tradjenta and metformin -Continue with Moderate NovoLog/scale insulin before meals and at bedtime -She is currently taking gabapentin 300 mg p.o. nightly -CBGs ranging from 163-221  HTN -Continue Amlodipine 2.5 mg pO BID -Continue to Monitor BP per Protocol -C/w Hydralazine 5 mg IV q4hprn High Blood Pressure  -Last BP reading was 101/54  Stage 3a CKD -She had an AKI during her prior hospitalization which is now resolved -Creatinine appears to be her baseline yesterday her BUN/creatinine was 8/0.19 -> 17/1.16 -> 17/0.95 -> 20/0.92 -> 22/0.91 -> 20/1.07 -Getting IVF as above and have now stopped -Avoid nephrotoxic medications, contrast dyes, hypotension and dehydration and renally dose medications -Continue to monitor and trend renal function repeat CMP in a.m.  Glaucoma -She has Left Eye Blindness -Followed by Dr. Manuella Ghazi at [facility] -C/w Brimonidine 1 drop Left eye TID and Dorzolamide-Timolol 22.3-6.8 1 drop Left Eye BID  Urinary Retention  Yeast UTI -Has an indwelling Foley with Plan for Outpatient Urology F/U within 1 week to follow-up with him in tomorrow.  We will discontinue Foley catheter for now and see if she can void on her own given that she was having some vaginal discomfort -Urine Cx Grew 80K Yeast and she was started on 14 Days of Diflucan 200 mg po Daily  (Day 10/14) but will stop Diflucan given the interaction with Apixaban -Urinary Retention improved.   Medication Noncompliance  -Her daughter noted confusion about which meds she is supposed to be taking, polypharmacy -Suggest close  ongoing discussion and education regarding importance of medications during and after hospitalization -Suggest specific instructions given her language barrier and possible low medical literacy   DVT prophylaxis: SCDs Code Status: FULL CODE  Family Communication: Discussed with husband at bedside Disposition Plan: Pending GI Clearance and tolerance of diet as well as evaluation by PT and OT.  Status is: Inpatient  Remains inpatient appropriate because: She needs GI clearance prior to safe discharge disposition  Consultants:  Gastroenterology  CIR  Procedures:  None  Antimicrobials: Anti-infectives (From admission, onward)    Start     Dose/Rate Route Frequency Ordered Stop   03/10/21 1545  metroNIDAZOLE (FLAGYL) tablet 500 mg        500 mg Oral Every 12 hours 03/10/21 1538     03/10/21 1545  cefdinir (OMNICEF) capsule 300 mg        300 mg Oral Every 12 hours 03/10/21 1538     03/07/21 1000  cefTRIAXone (ROCEPHIN) 2 g in sodium chloride 0.9 % 100 mL IVPB  Status:  Discontinued        2 g 200 mL/hr over 30 Minutes Intravenous Every 24 hours 03/06/21 1314 03/10/21 1538   03/06/21 2200  metroNIDAZOLE (FLAGYL) IVPB 500 mg  Status:  Discontinued        500 mg 100 mL/hr over 60 Minutes Intravenous Every 12 hours 03/06/21 1314 03/10/21 1538   03/06/21 1445  fluconazole (DIFLUCAN) tablet 200 mg  Status:  Discontinued        200 mg Oral Daily 03/06/21 1314 03/12/21 1142   03/06/21 1030  metroNIDAZOLE (FLAGYL) IVPB 500 mg        500 mg 100 mL/hr over 60 Minutes Intravenous  Once 03/06/21 1021 03/06/21 1226   03/06/21 1030  cefTRIAXone (ROCEPHIN) 1 g in sodium chloride 0.9 % 100 mL IVPB        1 g 200 mL/hr over 30 Minutes Intravenous  Once 03/06/21 1021 03/06/21 1226        Subjective: Seen and examined at bedside with assistance of the AMN translator Hussien #[id number].  She states that she is doing well.  No chest pain or shortness of breath.  Having bowel movements with no blood.   Abdominal pain is improved.  No other concerns or complaints at this time and awaiting placement in CIR.  Objective: Vitals:   03/11/21 1226 03/11/21 2012 03/12/21 0515 03/12/21 1238  BP: 130/63 (!) 127/57 132/69 (!) 101/54  Pulse: 75 76 76 71  Resp: 18 19 20 16   Temp: 99.7 F (37.6 C) 98.9 F (37.2 C) 98.6 F (37 C) 98.6 F (37 C)  TempSrc: Oral Oral Oral Oral  SpO2: 96% 99% 100% 100%  Weight:      Height:        Intake/Output Summary (Last 24 hours) at 03/12/2021 1521 Last data filed at 03/12/2021 1239 Gross per 24 hour  Intake 480  ml  Output 550 ml  Net -70 ml    Filed Weights   03/06/21 2100  Weight: 68.7 kg   Examination: Physical Exam:  Constitutional: WN/WD Arabic female in NAD and appears calm and comfortable Eyes:  Lids and conjunctivae normal, sclerae anicteric  ENMT: External Ears, Nose appear normal. Grossly normal hearing. Mucous membranes are moist.  Neck: Appears normal, supple, no cervical masses, normal ROM, no appreciable thyromegaly: No appreciable JVD Respiratory: Diminished to auscultation bilaterally, no wheezing, rales, rhonchi or crackles. Normal respiratory effort and patient is not tachypenic. No accessory muscle use.  Unlabored breathing Cardiovascular: RRR, no murmurs / rubs / gallops. S1 and S2 auscultated. No extremity edema.  Abdomen: Soft, non-tender, non-distended. Bowel sounds positive.  GU: Deferred. Musculoskeletal: No clubbing / cyanosis of digits/nails. No joint deformity upper and lower extremities.  Skin: No rashes, lesions, ulcers on limited skin evaluation. No induration; Warm and dry.  Neurologic: CN 2-12 grossly intact with no focal deficits. Romberg sign and cerebellar reflexes not assessed.  Psychiatric: Normal judgment and insight. Alert and oriented x 3. Normal mood and appropriate affect.   Data Reviewed: I have personally reviewed following labs and imaging studies  CBC: Recent Labs  Lab 03/08/21 0435 03/09/21 0424  03/10/21 0427 03/11/21 0517 03/12/21 0447  WBC 4.8 5.2 6.1 5.6 5.4  NEUTROABS 2.1 2.6 3.4 2.9 2.4  HGB 8.6* 8.2* 8.8* 8.2* 8.5*  HCT 27.7* 26.8* 28.1* 26.5* 28.0*  MCV 89.4 89.3 90.1 89.8 90.6  PLT 217 216 215 203 517    Basic Metabolic Panel: Recent Labs  Lab 03/08/21 0435 03/09/21 0424 03/10/21 0427 03/11/21 0517 03/12/21 0447  NA 138 140 140 140 137  K 4.2 4.0 3.9 4.1 4.3  CL 107 110 108 109 107  CO2 26 26 26 27 26   GLUCOSE 111* 157* 175* 97 292*  BUN 17 17 20  22* 29*  CREATININE 1.16* 0.95 0.92 0.91 1.07*  CALCIUM 8.6* 8.4* 8.7* 8.5* 8.5*  MG 2.0 2.0 2.1 2.0 2.1  PHOS 4.0 3.3 2.7 3.7 3.3   GFR: Estimated Creatinine Clearance: 61.8 mL/min (A) (by C-G formula based on SCr of 1.07 mg/dL (H)). Liver Function Tests: Recent Labs  Lab 03/08/21 0435 03/09/21 0424 03/10/21 0427 03/11/21 0517 03/12/21 0447  AST 16 17 20 15 23   ALT 11 13 14 12 13   ALKPHOS 78 89 99 82 100  BILITOT 0.3 0.1* 0.2* 0.4 0.2*  PROT 6.2* 5.9* 6.3* 6.0* 5.9*  ALBUMIN 2.6* 2.7* 2.9* 2.7* 2.6*   No results for input(s): LIPASE, AMYLASE in the last 168 hours. No results for input(s): AMMONIA in the last 168 hours.  Coagulation Profile: Recent Labs  Lab 03/06/21 0601  INR 1.1   Cardiac Enzymes: No results for input(s): CKTOTAL, CKMB, CKMBINDEX, TROPONINI in the last 168 hours.  BNP (last 3 results) No results for input(s): PROBNP in the last 8760 hours. HbA1C: No results for input(s): HGBA1C in the last 72 hours. CBG: Recent Labs  Lab 03/11/21 1223 03/11/21 1632 03/11/21 2014 03/12/21 0749 03/12/21 1124  GLUCAP 210* 194* 163* 221* 164*   Lipid Profile: No results for input(s): CHOL, HDL, LDLCALC, TRIG, CHOLHDL, LDLDIRECT in the last 72 hours. Thyroid Function Tests: No results for input(s): TSH, T4TOTAL, FREET4, T3FREE, THYROIDAB in the last 72 hours. Anemia Panel: No results for input(s): VITAMINB12, FOLATE, FERRITIN, TIBC, IRON, RETICCTPCT in the last 72 hours. Sepsis  Labs: No results for input(s): PROCALCITON, LATICACIDVEN in the last 168 hours.  Recent  Results (from the past 240 hour(s))  Resp Panel by RT-PCR (Flu A&B, Covid) Nasopharyngeal Swab     Status: None   Collection Time: 03/06/21 11:58 AM   Specimen: Nasopharyngeal Swab; Nasopharyngeal(NP) swabs in vial transport medium  Result Value Ref Range Status   SARS Coronavirus 2 by RT PCR NEGATIVE NEGATIVE Final    Comment: (NOTE) SARS-CoV-2 target nucleic acids are NOT DETECTED.  The SARS-CoV-2 RNA is generally detectable in upper respiratory specimens during the acute phase of infection. The lowest concentration of SARS-CoV-2 viral copies this assay can detect is 138 copies/mL. A negative result does not preclude SARS-Cov-2 infection and should not be used as the sole basis for treatment or other patient management decisions. A negative result may occur with  improper specimen collection/handling, submission of specimen other than nasopharyngeal swab, presence of viral mutation(s) within the areas targeted by this assay, and inadequate number of viral copies(<138 copies/mL). A negative result must be combined with clinical observations, patient history, and epidemiological information. The expected result is Negative.  Fact Sheet for Patients:  EntrepreneurPulse.com.au  Fact Sheet for Healthcare Providers:  IncredibleEmployment.be  This test is no t yet approved or cleared by the [place] FDA and  has been authorized for detection and/or diagnosis of SARS-CoV-2 by FDA under an Emergency Use Authorization (EUA). This EUA will remain  in effect (meaning this test can be used) for the duration of the COVID-19 declaration under Section 564(b)(1) of the Act, 21 U.S.C.section 360bbb-3(b)(1), unless the authorization is terminated  or revoked sooner.       Influenza A by PCR NEGATIVE NEGATIVE Final   Influenza B by PCR NEGATIVE NEGATIVE Final     Comment: (NOTE) The Xpert Xpress SARS-CoV-2/FLU/RSV plus assay is intended as an aid in the diagnosis of influenza from Nasopharyngeal swab specimens and should not be used as a sole basis for treatment. Nasal washings and aspirates are unacceptable for Xpert Xpress SARS-CoV-2/FLU/RSV testing.  Fact Sheet for Patients: EntrepreneurPulse.com.au  Fact Sheet for Healthcare Providers: IncredibleEmployment.be  This test is not yet approved or cleared by the [place] FDA and has been authorized for detection and/or diagnosis of SARS-CoV-2 by FDA under an Emergency Use Authorization (EUA). This EUA will remain in effect (meaning this test can be used) for the duration of the COVID-19 declaration under Section 564(b)(1) of the Act, 21 U.S.C. section 360bbb-3(b)(1), unless the authorization is terminated or revoked.  Performed at [facility], [place]  [address] [place], [place] [address]   Resp Panel by RT-PCR (Flu A&B, Covid) Nasopharyngeal Swab     Status: None   Collection Time: 03/09/21  2:56 PM   Specimen: Nasopharyngeal Swab; Nasopharyngeal(NP) swabs in vial transport medium  Result Value Ref Range Status   SARS Coronavirus 2 by RT PCR NEGATIVE NEGATIVE Final    Comment: (NOTE) SARS-CoV-2 target nucleic acids are NOT DETECTED.  The SARS-CoV-2 RNA is generally detectable in upper respiratory specimens during the acute phase of infection. The lowest concentration of SARS-CoV-2 viral copies this assay can detect is 138 copies/mL. A negative result does not preclude SARS-Cov-2 infection and should not be used as the sole basis for treatment or other patient management decisions. A negative result may occur with  improper specimen collection/handling, submission of specimen other than nasopharyngeal swab, presence of viral mutation(s) within the areas targeted by this assay, and inadequate number of viral copies(<138  copies/mL). A negative result must be combined with clinical observations, patient history, and epidemiological information.  The expected result is Negative.  Fact Sheet for Patients:  EntrepreneurPulse.com.au  Fact Sheet for Healthcare Providers:  IncredibleEmployment.be  This test is no t yet approved or cleared by the [place] FDA and  has been authorized for detection and/or diagnosis of SARS-CoV-2 by FDA under an Emergency Use Authorization (EUA). This EUA will remain  in effect (meaning this test can be used) for the duration of the COVID-19 declaration under Section 564(b)(1) of the Act, 21 U.S.C.section 360bbb-3(b)(1), unless the authorization is terminated  or revoked sooner.       Influenza A by PCR NEGATIVE NEGATIVE Final   Influenza B by PCR NEGATIVE NEGATIVE Final    Comment: (NOTE) The Xpert Xpress SARS-CoV-2/FLU/RSV plus assay is intended as an aid in the diagnosis of influenza from Nasopharyngeal swab specimens and should not be used as a sole basis for treatment. Nasal washings and aspirates are unacceptable for Xpert Xpress SARS-CoV-2/FLU/RSV testing.  Fact Sheet for Patients: EntrepreneurPulse.com.au  Fact Sheet for Healthcare Providers: IncredibleEmployment.be  This test is not yet approved or cleared by the [place] FDA and has been authorized for detection and/or diagnosis of SARS-CoV-2 by FDA under an Emergency Use Authorization (EUA). This EUA will remain in effect (meaning this test can be used) for the duration of the COVID-19 declaration under Section 564(b)(1) of the Act, 21 U.S.C. section 360bbb-3(b)(1), unless the authorization is terminated or revoked.  Performed at [facility], [place] [address] [place], [place] [address]      RN Pressure Injury Documentation:     Estimated body mass index is 23.72 kg/m as calculated from the  following:   Height as of this encounter: 5\' 7"  (1.702 m).   Weight as of this encounter: 68.7 kg.  Malnutrition Type: Nutrition Problem: Increased nutrient needs Etiology: acute illness Malnutrition Characteristics: Signs/Symptoms: estimated needs Nutrition Interventions: Interventions: Glucerna shake, Premier Protein, Prostat, MVI  Radiology Studies: No results found.  Scheduled Meds:  (feeding supplement) PROSource Plus  30 mL Oral Daily   amLODipine  2.5 mg Oral BID   apixaban  5 mg Oral BID   brimonidine  1 drop Left Eye TID   cefdinir  300 mg Oral Q12H   dorzolamide-timolol  1 drop Left Eye BID   feeding supplement (GLUCERNA SHAKE)  237 mL Oral Q24H   gabapentin  300 mg Oral QHS   insulin aspart  0-15 Units Subcutaneous TID WC   insulin aspart  0-5 Units Subcutaneous QHS   insulin detemir  15 Units Subcutaneous QHS   metroNIDAZOLE  500 mg Oral Q12H   multivitamin with minerals  1 tablet Oral Daily   Ensure Max Protein  11 oz Oral Daily   sodium chloride flush  3 mL Intravenous Q12H   Continuous Infusions:  sodium chloride      LOS: 6 days   [place], DO [facility] PAGER is on AMION  If 7PM-7AM, please contact night-coverage [email or web address]

## 2021-03-12 NOTE — Progress Notes (Signed)
Inpatient Rehab Admissions Coordinator:  Attempted to contact pt's daughter, Runell Gess. Received no answer and not able to leave a message. Began Ship broker. Will continue to follow.   Gayland Curry, [place], [place] Admissions Coordinator [phone]

## 2021-03-12 NOTE — Progress Notes (Signed)
Occupational Therapy Treatment Patient Details Name: Jennifer [name] MRN: [id number] DOB: 08-20-1970 Today's Date: 03/12/2021   History of present illness Jennifer [name] is a 50 y.o. female admitted 03/06/21 with proctitis. Pt was admitted from 02/25/21-03/05/21 at [facility] for multifocal CVAs. PMH: afib, L eye visual impairment, HTN, HLD, diabetes, L heart cath 02/05/21   OT comments  Patient was noted to have continued poor safety awareness with education provided to keep RW close with turns and not to push it away. Patient was min guard for toileting tasks on this date with consistent safety cues. Patient have one LOB with use of grab bar to maintain standing balance during hygiene tasks.Patient would continue to benefit from skilled OT services at this time while admitted and after d/c to address noted deficits in order to improve overall safety and independence in ADLs.      Recommendations for follow up therapy are one component of a multi-disciplinary discharge planning process, led by the attending physician.  Recommendations may be updated based on patient status, additional functional criteria and insurance authorization.    Follow Up Recommendations  Acute inpatient rehab (3hours/day)    Assistance Recommended at Discharge Frequent or constant Supervision/Assistance  Equipment Recommendations  None recommended by OT    Recommendations for Other Services      Precautions / Restrictions Precautions Precautions: Fall Precaution Comments: interpreter, L eye blindness Restrictions Weight Bearing Restrictions: No       Mobility Bed Mobility Overal bed mobility: Needs Assistance Bed Mobility: Supine to Sit     Supine to sit: Supervision          Transfers Overall transfer level: Needs assistance Equipment used: Rolling walker (2 wheels) Transfers: Sit to/from Stand Sit to Stand: Min guard           General transfer comment: min guard to transfer from edge of bed to  recliner in room with increased time and education to take walker on full turn.     Balance                                           ADL either performed or assessed with clinical judgement   ADL Overall ADL's : Needs assistance/impaired                 Upper Body Dressing : Sitting;Set up       Toilet Transfer: Min guard;Grab bars;Rolling walker (2 wheels);Ambulation Toilet Transfer Details (indicate cue type and reason): with increased time and education to keep walker with her with turning. Toileting- Water quality scientist and Hygiene: Min guard;Sit to/from stand Toileting - Clothing Manipulation Details (indicate cue type and reason): patient needed min guard with education to keep RW close.            Extremity/Trunk Assessment              Vision       Perception     Praxis      Cognition Arousal/Alertness: Awake/alert Behavior During Therapy: WFL for tasks assessed/performed Overall Cognitive Status: Within Functional Limits for tasks assessed Area of Impairment: Safety/judgement                               General Comments: patient was noted to be impulsive with ability to follow all commands.  Exercises     Shoulder Instructions       General Comments      Pertinent Vitals/ Pain       Pain Assessment: No/denies pain  Home Living                                          Prior Functioning/Environment              Frequency  Min 2X/week        Progress Toward Goals  OT Goals(current goals can now be found in the care plan section)  Progress towards OT goals: Progressing toward goals     Plan Discharge plan remains appropriate    Co-evaluation                 AM-PAC OT "6 Clicks" Daily Activity     Outcome Measure   Help from another person eating meals?: A Little Help from another person taking care of personal grooming?: A Little Help from  another person toileting, which includes using toliet, bedpan, or urinal?: A Little Help from another person bathing (including washing, rinsing, drying)?: A Little Help from another person to put on and taking off regular upper body clothing?: A Little Help from another person to put on and taking off regular lower body clothing?: A Little 6 Click Score: 18    End of Session Equipment Utilized During Treatment: Rolling walker (2 wheels)  OT Visit Diagnosis: Unsteadiness on feet (R26.81);Repeated falls (R29.6);Muscle weakness (generalized) (M62.81);Other symptoms and signs involving the nervous system (R29.898)   Activity Tolerance Patient tolerated treatment well   Patient Left in chair;with call bell/phone within reach;with chair alarm set   Nurse Communication Mobility status        Time: 1150-1210 OT Time Calculation (min): 20 min  Charges: OT General Charges $OT Visit: 1 Visit OT Treatments $Self Care/Home Management : 8-22 mins  Jackelyn Poling OTR/L, MS Acute Rehabilitation Department Office# [phone] Pager# [phone]   Marcellina Millin 03/12/2021, 1:09 PM

## 2021-03-12 NOTE — Progress Notes (Signed)
Physical Therapy Treatment Patient Details Name: Keyshla Tunison MRN: [id number] DOB: 23-Sep-1970 Today's Date: 03/12/2021   History of Present Illness Kyrianna Barletta is a 50 y.o. female admitted 03/06/21 with proctitis. Pt was admitted from 02/25/21-03/05/21 at [facility] for multifocal CVAs. PMH: afib, L eye visual impairment, HTN, HLD, diabetes, L heart cath 02/05/21    PT Comments    Pt progressing toward goals. Gait distance and activity tolerance improving. Pt demonstrated good carryover with RW safety awareness (reviewed with son and pt prior to amb, responds well to visual cues for correct foot position while amb)  Continue to recommend CIR  Recommendations for follow up therapy are one component of a multi-disciplinary discharge planning process, led by the attending physician.  Recommendations may be updated based on patient status, additional functional criteria and insurance authorization.  Follow Up Recommendations  Acute inpatient rehab (3hours/day)     Assistance Recommended at Discharge Frequent or constant Supervision/Assistance  Equipment Recommendations  None recommended by PT    Recommendations for Other Services       Precautions / Restrictions Precautions Precautions: Fall Precaution Comments: interpreter (son), L eye blindness Restrictions Weight Bearing Restrictions: No     Mobility  Bed Mobility               General bed mobility comments: sittign EOB with son present, alarm on    Transfers Overall transfer level: Needs assistance Equipment used: Rolling walker (2 wheels) Transfers: Sit to/from Stand Sit to Stand: Min guard           General transfer comment: min/guard to transfer from recliner and to bed, repeated for instruction. verbal cues for hand placement, control of descent and RW positioning    Ambulation/Gait Ambulation/Gait assistance: Min guard;Min assist Gait Distance (Feet): 60 Feet Assistive device: Rolling walker (2  wheels) Gait Pattern/deviations: Step-through pattern;Step-to pattern;Decreased stride length;Decreased dorsiflexion - right;Decreased dorsiflexion - left       General Gait Details: R foot drop with slight hip external rotation and  decr circumduction compensations, minimal L dorsiflexion with foot clearance, able to amb distance above with one standing rest. Visual and verbal cues for RW position   Stairs             Wheelchair Mobility    Modified Rankin (Stroke Patients Only)       Balance   Sitting-balance support: Feet supported;No upper extremity supported Sitting balance-Leahy Scale: Good Sitting balance - Comments: seated EOB   Standing balance support: During functional activity;Bilateral upper extremity supported;Reliant on assistive device for balance Standing balance-Leahy Scale: Poor Standing balance comment: reliant on UE support                            Cognition Arousal/Alertness: Awake/alert Behavior During Therapy: WFL for tasks assessed/performed Overall Cognitive Status: Within Functional Limits for tasks assessed                                          Exercises General Exercises - Upper Extremity Elbow Flexion: AROM;Both;20 reps Elbow Extension: AROM;Both;20 reps General Exercises - Lower Extremity Ankle Circles/Pumps: AROM;Both;10 reps Long Arc Quad: AROM;Both;10 reps;Seated Hip Flexion/Marching: AROM;Both;10 reps;Seated Toe Raises: AROM;Both;10 reps;Seated;Limitations Toe Raises Limitations: limited AROM bil, (foot drop R) passive gentle stretch R ankle Heel Raises: AROM;Both;10 reps;Seated    General Comments  Pertinent Vitals/Pain Pain Assessment: No/denies pain    Home Living                          Prior Function            PT Goals (current goals can now be found in the care plan section) Acute Rehab PT Goals Patient Stated Goal: get stronger and walk PT Goal  Formulation: With patient/family Time For Goal Achievement: 03/23/21 Potential to Achieve Goals: Good Progress towards PT goals: Progressing toward goals    Frequency    Min 3X/week      PT Plan Discharge plan needs to be updated    Co-evaluation              AM-PAC PT "6 Clicks" Mobility   Outcome Measure  Help needed turning from your back to your side while in a flat bed without using bedrails?: A Little Help needed moving from lying on your back to sitting on the side of a flat bed without using bedrails?: A Little Help needed moving to and from a bed to a chair (including a wheelchair)?: A Little Help needed standing up from a chair using your arms (e.g., wheelchair or bedside chair)?: A Little Help needed to walk in hospital room?: A Little Help needed climbing 3-5 steps with a railing? : A Lot 6 Click Score: 17    End of Session Equipment Utilized During Treatment: Gait belt Activity Tolerance: Patient tolerated treatment well Patient left: in bed;with call bell/phone within reach;with bed alarm set;with family/visitor present   PT Visit Diagnosis: Unsteadiness on feet (R26.81);Other abnormalities of gait and mobility (R26.89);Muscle weakness (generalized) (M62.81);Difficulty in walking, not elsewhere classified (R26.2)     Time: 9323-5573 PT Time Calculation (min) (ACUTE ONLY): 25 min  Charges:  $Gait Training: 8-22 mins $Therapeutic Exercise: 8-22 mins                     Baxter Flattery, PT  Acute Rehab Dept ([place]) [phone] Pager [phone]  03/12/2021    [facility] 03/12/2021, 5:18 PM

## 2021-03-12 NOTE — Progress Notes (Signed)
Strokes were not present at admission.  Comparison of two MRIs of brain.

## 2021-03-13 LAB — GLUCOSE, CAPILLARY
Glucose-Capillary: 207 mg/dL — ABNORMAL HIGH (ref 70–99)
Glucose-Capillary: 136 mg/dL — ABNORMAL HIGH (ref 70–99)
Glucose-Capillary: 249 mg/dL — ABNORMAL HIGH (ref 70–99)
Glucose-Capillary: 155 mg/dL — ABNORMAL HIGH (ref 70–99)

## 2021-03-13 LAB — CBC WITH DIFFERENTIAL/PLATELET
Abs Immature Granulocytes: 0.02 10*3/uL (ref 0.00–0.07)
Basophils Absolute: 0 10*3/uL (ref 0.0–0.1)
Basophils Relative: 1 %
Eosinophils Absolute: 0.2 10*3/uL (ref 0.0–0.5)
Eosinophils Relative: 3 %
HCT: 27.5 % — ABNORMAL LOW (ref 36.0–46.0)
Hemoglobin: 8.5 g/dL — ABNORMAL LOW (ref 12.0–15.0)
Immature Granulocytes: 0 %
Lymphocytes Relative: 40 %
Lymphs Abs: 1.9 10*3/uL (ref 0.7–4.0)
MCH: 28 pg (ref 26.0–34.0)
MCHC: 30.9 g/dL (ref 30.0–36.0)
MCV: 90.5 fL (ref 80.0–100.0)
Monocytes Absolute: 0.3 10*3/uL (ref 0.1–1.0)
Monocytes Relative: 6 %
Neutro Abs: 2.4 10*3/uL (ref 1.7–7.7)
Neutrophils Relative %: 50 %
Platelets: 194 10*3/uL (ref 150–400)
RBC: 3.04 MIL/uL — ABNORMAL LOW (ref 3.87–5.11)
RDW: 12.4 % (ref 11.5–15.5)
WBC: 4.9 10*3/uL (ref 4.0–10.5)
nRBC: 0 % (ref 0.0–0.2)

## 2021-03-13 LAB — COMPREHENSIVE METABOLIC PANEL
ALT: 15 U/L (ref 0–44)
AST: 24 U/L (ref 15–41)
Albumin: 2.7 g/dL — ABNORMAL LOW (ref 3.5–5.0)
Alkaline Phosphatase: 109 U/L (ref 38–126)
Anion gap: 4 — ABNORMAL LOW (ref 5–15)
BUN: 37 mg/dL — ABNORMAL HIGH (ref 6–20)
CO2: 25 mmol/L (ref 22–32)
Calcium: 8.4 mg/dL — ABNORMAL LOW (ref 8.9–10.3)
Chloride: 106 mmol/L (ref 98–111)
Creatinine, Ser: 1.09 mg/dL — ABNORMAL HIGH (ref 0.44–1.00)
GFR, Estimated: >60 mL/min (ref >60)
Glucose, Bld: 308 mg/dL — ABNORMAL HIGH (ref 70–99)
Potassium: 4.4 mmol/L (ref 3.5–5.1)
Sodium: 135 mmol/L (ref 135–145)
Total Bilirubin: 0.3 mg/dL (ref 0.3–1.2)
Total Protein: 5.9 g/dL — ABNORMAL LOW (ref 6.5–8.1)

## 2021-03-13 LAB — PHOSPHORUS: Phosphorus: 3.2 mg/dL (ref 2.5–4.6)

## 2021-03-13 LAB — MAGNESIUM: Magnesium: 2 mg/dL (ref 1.7–2.4)

## 2021-03-13 MED ORDER — INSULIN DETEMIR 100 UNIT/ML ~~LOC~~ SOLN
18.0000 [IU] | Freq: Every day | SUBCUTANEOUS | Status: DC
  Administered 2021-03-13 – 2021-03-16 (×4): 18 [IU] via SUBCUTANEOUS
  Filled 2021-03-13 (×5): qty 0.18

## 2021-03-13 NOTE — Progress Notes (Signed)
Inpatient Rehab Admissions Coordinator:   I do not have a bed for this pt. On CIR today. I did receive insurance auth earlier today, now await a bed.   Clemens Catholic, [place], [place] Admissions Coordinator  [phone] ([place]) [phone] (office)

## 2021-03-13 NOTE — Progress Notes (Signed)
Inpatient Diabetes Program Recommendations  AACE/ADA: New Consensus Statement on Inpatient Glycemic Control (2015)  Target Ranges:  Prepandial:   less than 140 mg/dL      Peak postprandial:   less than 180 mg/dL (1-2 hours)      Critically ill patients:  140 - 180 mg/dL    Latest Reference Range & Units 03/12/21 07:49 03/12/21 11:24 03/12/21 16:31 03/12/21 20:52  Glucose-Capillary 70 - 99 mg/dL 221 (H)  5 units Novolog  164 (H)  3 units Novolog  159 (H)  3 units Novolog  283 (H)  3 units Novolog  15 units Levemir    Latest Reference Range & Units 03/13/21 07:35  Glucose-Capillary 70 - 99 mg/dL 207 (H)  5 units Novolog       Home DM Meds: Levemir units 15 QHS    Novolog 3 units TID   Tradjenta 5 mg daily   Metformin 1000 mg BID   Current Orders: Levemir 15 units QHS  Novolog 0-15 units TID ac/hs   Current A1c= 12.1% (Oct 2022) (Dtr/Pt have been counseled by DM Coordinator in Feb 2022, Aug 2022 and they also see Debera Lat, Diabetes Educator at PCP office)     MD- Note AM CBGs >200 the last 2 days  Please consider increasing the Levemir to 18 units QHS   --Will follow patient during hospitalization--  Wyn Quaker RN, MSN, CDE Diabetes Coordinator Inpatient Glycemic Control Team Team Pager: [phone] (8a-5p)

## 2021-03-13 NOTE — Progress Notes (Signed)
Report received from I. Oraegbunam,RN. No change in assessment. Continue plan of care. Alyzah Pelly Johnson, RN  

## 2021-03-13 NOTE — Progress Notes (Signed)
PROGRESS NOTE    Jennifer [name]  KVQ:[id number] DOB: 1971-04-04 DOA: 03/06/2021 PCP: Jose Persia, MD  Brief Narrative:  The patient is a 50 year old Arabic female with a past medical history significant for but not limited to atrial fibrillation on anticoagulation with Eliquis, history of left eye visual impairment, hypertension, hyperlipidemia as well as diabetes mellitus type 2 who was recently admitted to [facility] from 02/25/2021 until 03/05/2021 for multifocal CVAs who now presents to the hospital for rectal bleeding.  She states that she is okay when she left the hospital the day before yesterday but when she got home her stomach was hurting and she complained about this in the hospital and told that it might be for medications.  He states that it started around 2:30 PM and she states that she started having 3-4 stools overnight and noticed blood in the stool by the end she had frank blood clots last time.  She was having left flank pain and her last dose of Eliquis was yesterday morning and did not take any medications last night.  Upon arrival to the ED she had 4 bloody stools and has a Foley catheter for retention.  She is heme positive and GI was consulted.  CT of the abdomen pelvis showed concern for colitis and proctitis so she is started on IV Rocephin and Flagyl.  Rectal bleeding has improved however hemoglobin is dropped a little bit but is relatively stable and slightly improved.  GI evaluated and recommending placing this patient on soft diet.  If she tolerates a soft diet without issues she can be discharged home however she is significantly weak so we will obtain a PT and OT to further evaluate and they are recommending CIR admission.  We have discontinued her Foley and will be checking for residuals she is voiding well.  Because of her IV issues today we will change her to p.o. cefdinir and Flagyl.  She is medically stable to D/C to CIR but currently awaiting Acceptance. We  have resumed her Anticoagulation 03/12/21 and will monitor for bleeding. Has had no further bleeding  . Assessment & Plan:   Principal Problem:   Acute proctitis Active Problems:   Type II diabetes mellitus (HCC)   Atrial fibrillation (HCC)   Blind left eye   CKD (chronic kidney disease) stage 3, GFR 30-59 ml/min (HCC)   Brain lesion   Acute lower GI bleeding   Glaucoma   Acute urinary retention   At risk for medication noncompliance   Proctitis/Colitis with GI bleeding, improved  -She was hospitalized from 02/25/21 until 03/05/2021 -She developed abdominal pain prior to discharge and then had recurrent bloody stools overnight -CT scan showed "Prominent edematous wall thickening in the rectum with perirectal and presacral edema. Imaging features suggest infectious/inflammatory proctitis. There is probably some associated wall thickening in the site distal sigmoid colon in features may reflect a left-sided infectious/inflammatory colitis. Foley catheter decompresses the urinary bladder. Gas in the bladder lumen is compatible with the instrumentation." -She is admitted for ongoing management of her abdominal pain and bloody bowel movement have improved -She was initiated on IV Cipro Flagyl and was changed to ceftriaxone and Flagyl will continue and GI recommends 10-day total course of Cipro Flagyl for possible infectious proctocolitis but will also change to p.o. cefdinir Flagyl for total 10-day course -Hemoglobin has dropped and now went from 10.2/32.6 -> 9.0/31.4  -> 8.2/26.4 -> 8.4/27.6 -> 8.6/27.7 -> 8.2/26.8 and today it was 8.8/28.1 -> 8.2/26.5 -> 8.5/28.0 ->  8.5/27.5 -Continued to hold Eliquis for now for 5 days total from a neurological standpoint but will now resume.  -She is initiated on a clear liquid diet for now and will need further diet advancement per GI and she was advanced to a soft diet and she is tolerating this well -Patient is having some diarrhea and abdominal cramping  but no real blood -GI feels that this is a difficult case given her recent CVA and they recommended her for bleeding so long they would be reluctant to do a colonoscopy on the other hand if her worsens they can do a sigmoidoscopy versus minimal or no sedation. -GI recommends holding Eliquis for the next 5 days if at all possible but if felt to be a high risk from stroke recurrence standpoint consider IV heparin and see how she does however I spoke with Dr. Leonel Ramsay who recommends no anticoagulation given her recent bleeding -IVF -C/w Supportive Care with Antiemtics with Ondansetron 4 mg po/IV q6hprn Nausea -Nutritionist has been consulted and recommending Glucerna shake once a day as well as Ensure max once a day as well as 30 mL of Prosource plus once a day and multivitamin with minerals daily -Discussed with neurology Dr. Leonel Ramsay who recommends that the risks of anticoagulation outweigh the benefits currently and recommends resuming once okay from a GI standpoint. Eliquis resumed on 03/12/21   Prior CVA/abnormal MRI with ? Brain Metastases  -She was seen by neurology during the prior hospitalization -She is scheduled for repeat imaging in 4 weeks with plan for close neurology outpatient follow-up -There is concern for severely uncontrolled vascular factors noted and appears that this may be her primary issue -Unfortunately we will continue to hold Eliquis for now given her GI bleeding and GI recommends for 5 days; may consider resuming anticoagulation with heparin drip but will hold off and resume anticoagulation in outpatient setting -Discussed with neurology -We will obtain PT OT to further evaluate and treat given that she is significantly weak and they are recommending CIR; Rehab Admissions Coordinator consulted for further evaluation and awaiting for acceptance and Insurance Authorization   Atrial Fibrillation -She was previously on metoprolol but this was held during last admission  due to soft blood pressure and she is to restart slowly -Currently holding anticoagulation of the cause given her GI bleeding and recommended resuming in the AM as the 5 days will have passed; Resumed AC with Apixaban  Uncontrolled Diabetes mellitus type 2 with hyperglycemia -Globin AC was 12.1 indicating very poor control -She is initiated on Levemir which we will continue at 12 units and ncreased to 15 units and will further increase to 18 units -We will continue to hold Tradjenta and metformin -Continue with Moderate NovoLog/scale insulin before meals and at bedtime -She is currently taking gabapentin 300 mg p.o. nightly -CBGs ranging from 136-283  HTN -Continue Amlodipine 2.5 mg pO BID -Continue to Monitor BP per Protocol -C/w Hydralazine 5 mg IV q4hprn High Blood Pressure  -Last BP reading was 141/70  Stage 3a CKD -She had an AKI during her prior hospitalization which is now resolved -Creatinine appears to be her baseline yesterday her BUN/creatinine was 8/0.19 -> 17/1.16 -> 17/0.95 -> 20/0.92 -> 22/0.91 -> 20/1.07 -> 37/1.09 -Getting IVF as above and have now stopped -Avoid nephrotoxic medications, contrast dyes, hypotension and dehydration and renally dose medications -Continue to monitor and trend renal function repeat CMP in a.m.  Glaucoma -She has Left Eye Blindness -Followed by Dr. Manuella Ghazi at [facility] -  C/w Brimonidine 1 drop Left eye TID and Dorzolamide-Timolol 22.3-6.8 1 drop Left Eye BID  Urinary Retention  Yeast UTI -Has an indwelling Foley with Plan for Outpatient Urology F/U within 1 week to follow-up with him in tomorrow.  We will discontinue Foley catheter for now and see if she can void on her own given that she was having some vaginal discomfort -Urine Cx Grew 80K Yeast and she was started on 14 Days of Diflucan 200 mg po Daily  (Day 10/14) but will stop Diflucan given the interaction with Apixaban -Urinary Retention improved.   Medication Noncompliance  -Her  daughter noted confusion about which meds she is supposed to be taking, polypharmacy -Suggest close ongoing discussion and education regarding importance of medications during and after hospitalization -Suggest specific instructions given her language barrier and possible low medical literacy   DVT prophylaxis: SCDs Code Status: FULL CODE  Family Communication: No family present at bedside  Disposition Plan: Pending GI Clearance and tolerance of diet as well as evaluation by PT and OT.  Status is: Inpatient  Remains inpatient appropriate because: She needs GI clearance prior to safe discharge disposition  Consultants:  Gastroenterology  CIR  Procedures:  None  Antimicrobials: Anti-infectives (From admission, onward)    Start     Dose/Rate Route Frequency Ordered Stop   03/10/21 1545  metroNIDAZOLE (FLAGYL) tablet 500 mg        500 mg Oral Every 12 hours 03/10/21 1538     03/10/21 1545  cefdinir (OMNICEF) capsule 300 mg        300 mg Oral Every 12 hours 03/10/21 1538     03/07/21 1000  cefTRIAXone (ROCEPHIN) 2 g in sodium chloride 0.9 % 100 mL IVPB  Status:  Discontinued        2 g 200 mL/hr over 30 Minutes Intravenous Every 24 hours 03/06/21 1314 03/10/21 1538   03/06/21 2200  metroNIDAZOLE (FLAGYL) IVPB 500 mg  Status:  Discontinued        500 mg 100 mL/hr over 60 Minutes Intravenous Every 12 hours 03/06/21 1314 03/10/21 1538   03/06/21 1445  fluconazole (DIFLUCAN) tablet 200 mg  Status:  Discontinued        200 mg Oral Daily 03/06/21 1314 03/12/21 1142   03/06/21 1030  metroNIDAZOLE (FLAGYL) IVPB 500 mg        500 mg 100 mL/hr over 60 Minutes Intravenous  Once 03/06/21 1021 03/06/21 1226   03/06/21 1030  cefTRIAXone (ROCEPHIN) 1 g in sodium chloride 0.9 % 100 mL IVPB        1 g 200 mL/hr over 30 Minutes Intravenous  Once 03/06/21 1021 03/06/21 1226        Subjective: Seen and examined at bedside with assistance of the AMN translator [place] #[id number].  Patient states  that she is doing well and has no chest pain, shortness of breath, nausea or vomiting.  Has had no more bloody bowel movements.  Denies any abdominal pain.  No other concerns or complaints at this time still awaiting placement in CIR.  Objective: Vitals:   03/12/21 2016 03/13/21 0511 03/13/21 1028 03/13/21 1215  BP: 139/68 140/72 139/60 (!) 141/70  Pulse: 83 78  84  Resp: 18 18  18   Temp: 98.4 F (36.9 C) 98.6 F (37 C)  98.2 F (36.8 C)  TempSrc: Oral Oral  Oral  SpO2: 100% 100%  100%  Weight:      Height:        Intake/Output  Summary (Last 24 hours) at 03/13/2021 1419 Last data filed at 03/13/2021 1200 Gross per 24 hour  Intake 780 ml  Output 600 ml  Net 180 ml    Filed Weights   03/06/21 2100  Weight: 68.7 kg   Examination: Physical Exam:  Constitutional: WN/WD Arabic female in no acute distress appears calm and comfortable Eyes: Lids normal.  Has left eye blindness ENMT: External Ears, Nose appear normal. Grossly normal hearing. Mucous membranes are moist.  Neck: Appears normal, supple, no cervical masses, normal ROM, no appreciable thyromegaly; no appreciable JVD Respiratory: Diminished to auscultation bilaterally, no wheezing, rales, rhonchi or crackles. Normal respiratory effort and patient is not tachypenic. No accessory muscle use.  Unlabored breathing Cardiovascular: RRR, no murmurs / rubs / gallops. S1 and S2 auscultated. No extremity edema.  Abdomen: Soft, non-tender, non-distended. Bowel sounds positive.  GU: Deferred. Musculoskeletal: No clubbing / cyanosis of digits/nails. No joint deformity upper and lower extremities. Skin: No rashes, lesions, ulcers on a limited skin evaluation. No induration; Warm and dry.  Neurologic: CN 2-12 grossly intact with no focal deficits.  Romberg sign and cerebellar reflexes not assessed.  Psychiatric: Normal judgment and insight. Alert and oriented x 3. Normal mood and appropriate affect.   Data Reviewed: I have personally  reviewed following labs and imaging studies  CBC: Recent Labs  Lab 03/09/21 0424 03/10/21 0427 03/11/21 0517 03/12/21 0447 03/13/21 0424  WBC 5.2 6.1 5.6 5.4 4.9  NEUTROABS 2.6 3.4 2.9 2.4 2.4  HGB 8.2* 8.8* 8.2* 8.5* 8.5*  HCT 26.8* 28.1* 26.5* 28.0* 27.5*  MCV 89.3 90.1 89.8 90.6 90.5  PLT 216 215 203 195 858    Basic Metabolic Panel: Recent Labs  Lab 03/09/21 0424 03/10/21 0427 03/11/21 0517 03/12/21 0447 03/13/21 0424  NA 140 140 140 137 135  K 4.0 3.9 4.1 4.3 4.4  CL 110 108 109 107 106  CO2 26 26 27 26 25   GLUCOSE 157* 175* 97 292* 308*  BUN 17 20 22* 29* 37*  CREATININE 0.95 0.92 0.91 1.07* 1.09*  CALCIUM 8.4* 8.7* 8.5* 8.5* 8.4*  MG 2.0 2.1 2.0 2.1 2.0  PHOS 3.3 2.7 3.7 3.3 3.2   GFR: Estimated Creatinine Clearance: 60.7 mL/min (A) (by C-G formula based on SCr of 1.09 mg/dL (H)). Liver Function Tests: Recent Labs  Lab 03/09/21 0424 03/10/21 0427 03/11/21 0517 03/12/21 0447 03/13/21 0424  AST 17 20 15 23 24   ALT 13 14 12 13 15   ALKPHOS 89 99 82 100 109  BILITOT 0.1* 0.2* 0.4 0.2* 0.3  PROT 5.9* 6.3* 6.0* 5.9* 5.9*  ALBUMIN 2.7* 2.9* 2.7* 2.6* 2.7*   No results for input(s): LIPASE, AMYLASE in the last 168 hours. No results for input(s): AMMONIA in the last 168 hours.  Coagulation Profile: No results for input(s): INR, PROTIME in the last 168 hours. Cardiac Enzymes: No results for input(s): CKTOTAL, CKMB, CKMBINDEX, TROPONINI in the last 168 hours.  BNP (last 3 results) No results for input(s): PROBNP in the last 8760 hours. HbA1C: No results for input(s): HGBA1C in the last 72 hours. CBG: Recent Labs  Lab 03/12/21 1124 03/12/21 1631 03/12/21 2052 03/13/21 0735 03/13/21 1107  GLUCAP 164* 159* 283* 207* 136*   Lipid Profile: No results for input(s): CHOL, HDL, LDLCALC, TRIG, CHOLHDL, LDLDIRECT in the last 72 hours. Thyroid Function Tests: No results for input(s): TSH, T4TOTAL, FREET4, T3FREE, THYROIDAB in the last 72 hours. Anemia  Panel: No results for input(s): VITAMINB12, FOLATE, FERRITIN, TIBC, IRON,  RETICCTPCT in the last 72 hours. Sepsis Labs: No results for input(s): PROCALCITON, LATICACIDVEN in the last 168 hours.  Recent Results (from the past 240 hour(s))  Resp Panel by RT-PCR (Flu A&B, Covid) Nasopharyngeal Swab     Status: None   Collection Time: 03/06/21 11:58 AM   Specimen: Nasopharyngeal Swab; Nasopharyngeal(NP) swabs in vial transport medium  Result Value Ref Range Status   SARS Coronavirus 2 by RT PCR NEGATIVE NEGATIVE Final    Comment: (NOTE) SARS-CoV-2 target nucleic acids are NOT DETECTED.  The SARS-CoV-2 RNA is generally detectable in upper respiratory specimens during the acute phase of infection. The lowest concentration of SARS-CoV-2 viral copies this assay can detect is 138 copies/mL. A negative result does not preclude SARS-Cov-2 infection and should not be used as the sole basis for treatment or other patient management decisions. A negative result may occur with  improper specimen collection/handling, submission of specimen other than nasopharyngeal swab, presence of viral mutation(s) within the areas targeted by this assay, and inadequate number of viral copies(<138 copies/mL). A negative result must be combined with clinical observations, patient history, and epidemiological information. The expected result is Negative.  Fact Sheet for Patients:  EntrepreneurPulse.com.au  Fact Sheet for Healthcare Providers:  IncredibleEmployment.be  This test is no t yet approved or cleared by the [place] FDA and  has been authorized for detection and/or diagnosis of SARS-CoV-2 by FDA under an Emergency Use Authorization (EUA). This EUA will remain  in effect (meaning this test can be used) for the duration of the COVID-19 declaration under Section 564(b)(1) of the Act, 21 U.S.C.section 360bbb-3(b)(1), unless the authorization is terminated  or  revoked sooner.       Influenza A by PCR NEGATIVE NEGATIVE Final   Influenza B by PCR NEGATIVE NEGATIVE Final    Comment: (NOTE) The Xpert Xpress SARS-CoV-2/FLU/RSV plus assay is intended as an aid in the diagnosis of influenza from Nasopharyngeal swab specimens and should not be used as a sole basis for treatment. Nasal washings and aspirates are unacceptable for Xpert Xpress SARS-CoV-2/FLU/RSV testing.  Fact Sheet for Patients: EntrepreneurPulse.com.au  Fact Sheet for Healthcare Providers: IncredibleEmployment.be  This test is not yet approved or cleared by the [place] FDA and has been authorized for detection and/or diagnosis of SARS-CoV-2 by FDA under an Emergency Use Authorization (EUA). This EUA will remain in effect (meaning this test can be used) for the duration of the COVID-19 declaration under Section 564(b)(1) of the Act, 21 U.S.C. section 360bbb-3(b)(1), unless the authorization is terminated or revoked.  Performed at [facility], [place] [address], [place], [place] [address]   Resp Panel by RT-PCR (Flu A&B, Covid) Nasopharyngeal Swab     Status: None   Collection Time: 03/09/21  2:56 PM   Specimen: Nasopharyngeal Swab; Nasopharyngeal(NP) swabs in vial transport medium  Result Value Ref Range Status   SARS Coronavirus 2 by RT PCR NEGATIVE NEGATIVE Final    Comment: (NOTE) SARS-CoV-2 target nucleic acids are NOT DETECTED.  The SARS-CoV-2 RNA is generally detectable in upper respiratory specimens during the acute phase of infection. The lowest concentration of SARS-CoV-2 viral copies this assay can detect is 138 copies/mL. A negative result does not preclude SARS-Cov-2 infection and should not be used as the sole basis for treatment or other patient management decisions. A negative result may occur with  improper specimen collection/handling, submission of specimen other than nasopharyngeal swab,  presence of viral mutation(s) within the areas targeted by this assay,  and inadequate number of viral copies(<138 copies/mL). A negative result must be combined with clinical observations, patient history, and epidemiological information. The expected result is Negative.  Fact Sheet for Patients:  EntrepreneurPulse.com.au  Fact Sheet for Healthcare Providers:  IncredibleEmployment.be  This test is no t yet approved or cleared by the [place] FDA and  has been authorized for detection and/or diagnosis of SARS-CoV-2 by FDA under an Emergency Use Authorization (EUA). This EUA will remain  in effect (meaning this test can be used) for the duration of the COVID-19 declaration under Section 564(b)(1) of the Act, 21 U.S.C.section 360bbb-3(b)(1), unless the authorization is terminated  or revoked sooner.       Influenza A by PCR NEGATIVE NEGATIVE Final   Influenza B by PCR NEGATIVE NEGATIVE Final    Comment: (NOTE) The Xpert Xpress SARS-CoV-2/FLU/RSV plus assay is intended as an aid in the diagnosis of influenza from Nasopharyngeal swab specimens and should not be used as a sole basis for treatment. Nasal washings and aspirates are unacceptable for Xpert Xpress SARS-CoV-2/FLU/RSV testing.  Fact Sheet for Patients: EntrepreneurPulse.com.au  Fact Sheet for Healthcare Providers: IncredibleEmployment.be  This test is not yet approved or cleared by the [place] FDA and has been authorized for detection and/or diagnosis of SARS-CoV-2 by FDA under an Emergency Use Authorization (EUA). This EUA will remain in effect (meaning this test can be used) for the duration of the COVID-19 declaration under Section 564(b)(1) of the Act, 21 U.S.C. section 360bbb-3(b)(1), unless the authorization is terminated or revoked.  Performed at [facility], [place] [address], [place], [place] [address]       RN Pressure Injury Documentation:     Estimated body mass index is 23.72 kg/m as calculated from the following:   Height as of this encounter: 5\' 7"  (1.702 m).   Weight as of this encounter: 68.7 kg.  Malnutrition Type: Nutrition Problem: Increased nutrient needs Etiology: acute illness Malnutrition Characteristics: Signs/Symptoms: estimated needs Nutrition Interventions: Interventions: Glucerna shake, Premier Protein, Prostat, MVI  Radiology Studies: No results found.  Scheduled Meds:  (feeding supplement) PROSource Plus  30 mL Oral Daily   amLODipine  2.5 mg Oral BID   apixaban  5 mg Oral BID   brimonidine  1 drop Left Eye TID   cefdinir  300 mg Oral Q12H   dorzolamide-timolol  1 drop Left Eye BID   feeding supplement (GLUCERNA SHAKE)  237 mL Oral Q24H   gabapentin  300 mg Oral QHS   insulin aspart  0-15 Units Subcutaneous TID WC   insulin aspart  0-5 Units Subcutaneous QHS   insulin detemir  15 Units Subcutaneous QHS   metroNIDAZOLE  500 mg Oral Q12H   multivitamin with minerals  1 tablet Oral Daily   Ensure Max Protein  11 oz Oral Daily   sodium chloride flush  3 mL Intravenous Q12H   Continuous Infusions:  sodium chloride      LOS: 7 days   [place], DO [facility] PAGER is on AMION  If 7PM-7AM, please contact night-coverage [email or web address]

## 2021-03-14 LAB — COMPREHENSIVE METABOLIC PANEL
ALT: 16 U/L (ref 0–44)
AST: 21 U/L (ref 15–41)
Albumin: 2.7 g/dL — ABNORMAL LOW (ref 3.5–5.0)
Alkaline Phosphatase: 81 U/L (ref 38–126)
Anion gap: 5 (ref 5–15)
BUN: 34 mg/dL — ABNORMAL HIGH (ref 6–20)
CO2: 25 mmol/L (ref 22–32)
Calcium: 9 mg/dL (ref 8.9–10.3)
Chloride: 110 mmol/L (ref 98–111)
Creatinine, Ser: 0.91 mg/dL (ref 0.44–1.00)
GFR, Estimated: >60 mL/min (ref >60)
Glucose, Bld: 131 mg/dL — ABNORMAL HIGH (ref 70–99)
Potassium: 4.2 mmol/L (ref 3.5–5.1)
Sodium: 140 mmol/L (ref 135–145)
Total Bilirubin: 0.2 mg/dL — ABNORMAL LOW (ref 0.3–1.2)
Total Protein: 6.1 g/dL — ABNORMAL LOW (ref 6.5–8.1)

## 2021-03-14 LAB — CBC WITH DIFFERENTIAL/PLATELET
Abs Immature Granulocytes: 0.02 10*3/uL (ref 0.00–0.07)
Basophils Absolute: 0.1 10*3/uL (ref 0.0–0.1)
Basophils Relative: 1 %
Eosinophils Absolute: 0.2 10*3/uL (ref 0.0–0.5)
Eosinophils Relative: 4 %
HCT: 25.7 % — ABNORMAL LOW (ref 36.0–46.0)
Hemoglobin: 8.1 g/dL — ABNORMAL LOW (ref 12.0–15.0)
Immature Granulocytes: 0 %
Lymphocytes Relative: 49 %
Lymphs Abs: 2.6 10*3/uL (ref 0.7–4.0)
MCH: 27.8 pg (ref 26.0–34.0)
MCHC: 31.5 g/dL (ref 30.0–36.0)
MCV: 88.3 fL (ref 80.0–100.0)
Monocytes Absolute: 0.3 10*3/uL (ref 0.1–1.0)
Monocytes Relative: 7 %
Neutro Abs: 2 10*3/uL (ref 1.7–7.7)
Neutrophils Relative %: 39 %
Platelets: 185 10*3/uL (ref 150–400)
RBC: 2.91 MIL/uL — ABNORMAL LOW (ref 3.87–5.11)
RDW: 12.5 % (ref 11.5–15.5)
WBC: 5.3 10*3/uL (ref 4.0–10.5)
nRBC: 0 % (ref 0.0–0.2)

## 2021-03-14 LAB — MAGNESIUM: Magnesium: 2 mg/dL (ref 1.7–2.4)

## 2021-03-14 LAB — GLUCOSE, CAPILLARY
Glucose-Capillary: 74 mg/dL (ref 70–99)
Glucose-Capillary: 286 mg/dL — ABNORMAL HIGH (ref 70–99)
Glucose-Capillary: 182 mg/dL — ABNORMAL HIGH (ref 70–99)
Glucose-Capillary: 270 mg/dL — ABNORMAL HIGH (ref 70–99)

## 2021-03-14 LAB — PHOSPHORUS: Phosphorus: 3.3 mg/dL (ref 2.5–4.6)

## 2021-03-14 MED ORDER — PROSOURCE PLUS PO LIQD
30.0000 mL | Freq: Two times a day (BID) | ORAL | Status: DC
  Administered 2021-03-14 – 2021-03-15 (×4): 30 mL via ORAL
  Filled 2021-03-14 (×4): qty 30

## 2021-03-14 NOTE — Progress Notes (Signed)
Occupational Therapy Treatment Patient Details Name: Jennifer [name] MRN: [id number] DOB: 26-Sep-1970 Today's Date: 03/14/2021   History of present illness Jennifer [name] is a 50 y.o. female admitted 03/06/21 with proctitis. Pt was admitted from 02/25/21-03/05/21 at [facility] for multifocal CVAs. PMH: afib, L eye visual impairment, HTN, HLD, diabetes, L heart cath 02/05/21   OT comments  This 50 yo female admitted with above seen today making progress with getting to bathroom with RW and standing at sink for grooming. She is showing decreased safety awareness with RW use and does not feel safe on her feet even with RW (fear of falling). She will continue to benefit from acute OT with follow up on CIR.   Recommendations for follow up therapy are one component of a multi-disciplinary discharge planning process, led by the attending physician.  Recommendations may be updated based on patient status, additional functional criteria and insurance authorization.    Follow Up Recommendations  Acute inpatient rehab (3hours/day)    Assistance Recommended at Discharge Frequent or constant Supervision/Assistance  Equipment Recommendations  None recommended by OT    Recommendations for Other Services Rehab consult    Precautions / Restrictions Precautions Precautions: Fall Precaution Comments: interpreter (video), L eye blindness Restrictions Weight Bearing Restrictions: No       Mobility Bed Mobility Overal bed mobility: Needs Assistance Bed Mobility: Supine to Sit     Supine to sit: Supervision          Transfers Overall transfer level: Needs assistance Equipment used: Rolling walker (2 wheels) Transfers: Sit to/from Stand Sit to Stand: Min assist                 Balance Overall balance assessment: Needs assistance Sitting-balance support: No upper extremity supported;Feet supported Sitting balance-Leahy Scale: Good     Standing balance support: Bilateral upper extremity  supported;During functional activity Standing balance-Leahy Scale: Poor Standing balance comment: proppped on sink to wash hands                           ADL either performed or assessed with clinical judgement   ADL Overall ADL's : Needs assistance/impaired     Grooming: Min guard;Standing;Wash/dry hands Grooming Details (indicate cue type and reason): at sink, propping on sink (when asked why she was propping she reported she was fearful of falling and always props)             Lower Body Dressing: Minimal assistance Lower Body Dressing Details (indicate cue type and reason): min A sit<>stand Toilet Transfer: Minimal assistance;Ambulation;Rolling walker (2 wheels);Grab bars;Regular Toilet                  Extremity/Trunk Assessment Upper Extremity Assessment Upper Extremity Assessment: Overall WFL for tasks assessed            Vision Baseline Vision/History: 3 Glaucoma;4 Cataracts;1 Wears glasses Ability to See in Adequate Light: 2 Moderately impaired Patient Visual Report: No change from baseline            Cognition Arousal/Alertness: Awake/alert Behavior During Therapy: WFL for tasks assessed/performed   Area of Impairment: Safety/judgement                         Safety/Judgement: Decreased awareness of safety     General Comments: letting go of RW and reaching for other objects to hold onto, sitting before squared with with recliner  Pertinent Vitals/ Pain       Pain Assessment: No/denies pain         Frequency  Min 2X/week        Progress Toward Goals  OT Goals(current goals can now be found in the care plan section)  Progress towards OT goals: Progressing toward goals  Acute Rehab OT Goals Patient Stated Goal: to go to rehab then home OT Goal Formulation: With family Time For Goal Achievement: 03/22/21 Potential to Achieve Goals: Good  Plan Discharge plan remains appropriate        AM-PAC OT "6 Clicks" Daily Activity     Outcome Measure   Help from another person eating meals?: None Help from another person taking care of personal grooming?: A Little Help from another person toileting, which includes using toliet, bedpan, or urinal?: A Little Help from another person bathing (including washing, rinsing, drying)?: A Little Help from another person to put on and taking off regular upper body clothing?: A Little Help from another person to put on and taking off regular lower body clothing?: A Little 6 Click Score: 19    End of Session Equipment Utilized During Treatment: Rolling walker (2 wheels)  OT Visit Diagnosis: Unsteadiness on feet (R26.81);Other abnormalities of gait and mobility (R26.89);Other symptoms and signs involving cognitive function   Activity Tolerance Patient tolerated treatment well   Patient Left in chair;with call bell/phone within reach;with chair alarm set           Time: 1029-1051 OT Time Calculation (min): 22 min  Charges: OT General Charges $OT Visit: 1 Visit OT Treatments $Self Care/Home Management : 8-22 mins  [address] [address], OTR/L Acute [employer] Pager [phone] Office [phone]    Almon Register 03/14/2021, 12:15 PM

## 2021-03-14 NOTE — Progress Notes (Signed)
Inpatient Rehab Admissions Coordinator:    I do not have a CIR bed for this Pt. Today. Will continue to follow for potential admit pending bed availability.  Clemens Catholic, [place], [place] Admissions Coordinator  [phone] ([place]) [phone] (office)

## 2021-03-14 NOTE — Progress Notes (Signed)
Nutrition Follow-up  DOCUMENTATION CODES:   Not applicable  INTERVENTION:  - continue Glucerna Shake once/day and Ensure Max once/day.  - will increase Prosource Plus from 30 ml once/day to 30 ml BID. - weigh patient today.   NUTRITION DIAGNOSIS:   Increased nutrient needs related to acute illness as evidenced by estimated needs. -ongoing  GOAL:   Patient will meet greater than or equal to 90% of their needs -minimally met on average   MONITOR:   PO intake, Supplement acceptance, Labs, Weight trends, I & O's  ASSESSMENT:   50 year old female with medical history of afib, left eye visual impairment, HTN, HLD, type 2 DM, iron deficiency anemia, and depression. She was admitted at [facility] 11/6-11/14 for multifocal CVAs. She presented to the ED on 11/15 due to rectal bleeding. In the ED, CT abdomen/pelvis showed concern for colitis and proctitis.  Patient is noted to be medically stable and has been accepted to CIR but pending bed availability.   Meal completion percentages have been 50-100% since 11/18. She has been accepting Glucerna and Ensure Max ~50% of the time offered and Prosource Plus 90% of the time offered.   She has not been weighed since admission on 11/15. No information documented in the edema section of flow sheet this hospitalization.    Labs reviewed; CBGs: 74 and 286 mg/dl, BUN: 34 mg/dl. Medications reviewed; sliding scale novolog, 18 units levemir/day, 1 tablet multivitamin with minerals/day. IVF; NS @ 75 ml/hr.    Diet Order:   Diet Order             DIET SOFT Room service appropriate? Yes; Fluid consistency: Thin  Diet effective now                   EDUCATION NEEDS:   Not appropriate for education at this time  Skin:  Skin Assessment: Reviewed RN Assessment  Last BM:  11/20 (type 6)  Height:   Ht Readings from Last 1 Encounters:  03/06/21 '5\' 7"'  (1.702 m)    Weight:   Wt Readings from Last 1 Encounters:  03/06/21 68.7 kg      Estimated Nutritional Needs:  Kcal:  2000-2200 kcal Protein:  100-115 grams Fluid:  >/= 2 L/day     Jarome Matin, MS, RD, LDN, CNSC Inpatient Clinical Dietitian RD pager # available in [facility]  After hours/weekend pager # available in [facility]

## 2021-03-14 NOTE — Plan of Care (Signed)

## 2021-03-14 NOTE — Progress Notes (Signed)
Progress Note    Jennifer [name]   JEH:[id number]  DOB: [birth year]  DOA: 03/06/2021     8 Date of Service: 03/14/2021   Clinical Course The patient is a 50 year old Arabic female with a past medical history significant for but not limited to atrial fibrillation on anticoagulation with Eliquis, history of left eye visual impairment, hypertension, hyperlipidemia as well as diabetes mellitus type 2 who was recently admitted to [facility] from 02/25/2021 until 03/05/2021 for multifocal CVAs who now presents to the hospital for rectal bleeding.  She states that she is okay when she left the hospital the day before yesterday but when she got home her stomach was hurting and she complained about this in the hospital and told that it might be for medications.  He states that it started around 2:30 PM and she states that she started having 3-4 stools overnight and noticed blood in the stool by the end she had frank blood clots last time.  She was having left flank pain and her last dose of Eliquis was yesterday morning and did not take any medications last night.  Upon arrival to the ED she had 4 bloody stools and has a Foley catheter for retention.  She is heme positive and GI was consulted.  CT of the abdomen pelvis showed concern for colitis and proctitis so she is started on IV Rocephin and Flagyl.  Rectal bleeding has improved however hemoglobin is dropped a little bit but is relatively stable and slightly improved.  GI evaluated and recommending placing this patient on soft diet.  If she tolerates a soft diet without issues she can be discharged home however she is significantly weak so we will obtain a PT and OT to further evaluate and they are recommending CIR admission.  We have discontinued her Foley and will be checking for residuals she is voiding well.  Because of her IV issues today we will change her to p.o. cefdinir and Flagyl.   She is medically stable to D/C to CIR but currently awaiting  Acceptance. We have resumed her Anticoagulation 03/12/21 and will monitor for bleeding. Has had no further bleeding   Assessment and Plan Proctitis/colitis with GI bleed - Continue cefdinir and Flagyl-symptoms improved  Atrial Fibrillation -She was previously on metoprolol but this was held during last admission due to soft blood pressure and she is to restart slowly -Currently holding anticoagulation of the cause given her GI bleeding and recommended resuming in the AM as the 5 days will have passed; Resumed AC with Apixaban  Uncontrolled Diabetes mellitus type 2 with hyperglycemia -Continue sliding scale insulin and Levemir  Essential hypertension Continue amlodipine and as needed hydralazine  CKD stage III - Follow  Subjective:  No complaints  Objective Vitals:   03/13/21 2010 03/14/21 0433 03/14/21 1200 03/14/21 1416  BP: 124/74 138/69  134/60  Pulse: 73 80  82  Resp: 18 18  17   Temp: 98.7 F (37.1 C)   97.8 F (36.6 C)  TempSrc: Oral   Oral  SpO2: 100% 100%  99%  Weight:   74.2 kg   Height:   5\' 7"  (1.702 m)    74.2 kg     Exam Physical Exam General exam: Appears comfortable  HEENT: PERRLA, oral mucosa moist, no sclera icterus or thrush Respiratory system: Clear to auscultation. Respiratory effort normal. Cardiovascular system: S1 & S2 heard, regular rate and rhythm Gastrointestinal system: Abdomen soft, non-tender, nondistended. Normal bowel sounds   Central nervous system:  Alert and oriented. No focal neurological deficits. Extremities: No cyanosis, clubbing or edema Skin: No rashes or ulcers Psychiatry:  Mood & affect appropriate.    Labs / Other Information My review of labs, imaging, notes and other tests shows no new significant findings.    Disposition Plan: Status is: Inpatient  Remains inpatient appropriate because: Awaiting: Inpatient rehab       Time spent: 30 minutes [facility] 03/14/2021, 5:21 PM

## 2021-03-14 NOTE — Progress Notes (Addendum)
Physical Therapy Treatment Patient Details Name: Jennifer [name] MRN: [id number] DOB: [birth year] Today's Date: 03/14/2021   History of Present Illness 50 y.o. female admitted 03/06/21 with proctitis. Pt was admitted from 02/25/21-03/05/21 at [facility] for multifocal CVAs. PMH: afib, L eye visual impairment, HTN, HLD, diabetes, L heart cath 02/05/21    PT Comments    Pt continues to participate well. She c/o some soreness and fatigue along quads. Continue to recommend CIR.   Recommendations for follow up therapy are one component of a multi-disciplinary discharge planning process, led by the attending physician.  Recommendations may be updated based on patient status, additional functional criteria and insurance authorization.  Follow Up Recommendations  Acute inpatient rehab (3hours/day)     Assistance Recommended at Discharge Frequent or constant Supervision/Assistance  Equipment Recommendations  None recommended by PT    Recommendations for Other Services Rehab consult     Precautions / Restrictions Precautions Precautions: Fall Precaution Comments: interpreter (video), L eye blindness Restrictions Weight Bearing Restrictions: No     Mobility  Bed Mobility Overal bed mobility: Needs Assistance Bed Mobility: Sit to Supine      Sit to supine: Min assist   General bed mobility comments: small amount of assist to position LEs.    Transfers Overall transfer level: Needs assistance Equipment used: Rolling walker (2 wheels) Transfers: Sit to/from Stand Sit to Stand: Min assist           General transfer comment: Cues for safety, hand placement. Assist to steady.    Ambulation/Gait Ambulation/Gait assistance: Min assist Gait Distance (Feet): 75 Feet Assistive device: Rolling walker (2 wheels) Gait Pattern/deviations: Step-through pattern;Step-to pattern;Decreased stride length;Decreased dorsiflexion - right;Decreased dorsiflexion - left       General Gait Details: R  foot drop with slight hip external rotation and  decr circumduction compensations, minimal L dorsiflexion with foot clearance, able to amb distance above with one standing rest break.   Stairs             Wheelchair Mobility    Modified Rankin (Stroke Patients Only)       Balance Overall balance assessment: Needs assistance Sitting-balance support: No upper extremity supported;Feet supported Sitting balance-Leahy Scale: Good     Standing balance support: Reliant on assistive device for balance;Bilateral upper extremity supported Standing balance-Leahy Scale: Poor Standing balance comment: proppped on sink to wash hands                            Cognition Arousal/Alertness: Awake/alert Behavior During Therapy: WFL for tasks assessed/performed   Area of Impairment: Safety/judgement                         Safety/Judgement: Decreased awareness of safety     General Comments: letting go of RW and reaching for other objects to hold onto, sitting before squared with with recliner        Exercises      General Comments        Pertinent Vitals/Pain Pain Assessment: Faces Faces Pain Scale: Hurts a little bit Pain Location: bil quads Pain Intervention(s): Monitored during session;Repositioned    Home Living                          Prior Function            PT Goals (current goals can now be found in the care plan  section) Progress towards PT goals: Progressing toward goals    Frequency    Min 3X/week      PT Plan Current plan remains appropriate    Co-evaluation              AM-PAC PT "6 Clicks" Mobility   Outcome Measure  Help needed turning from your back to your side while in a flat bed without using bedrails?: A Little Help needed moving from lying on your back to sitting on the side of a flat bed without using bedrails?: A Little Help needed moving to and from a bed to a chair (including a  wheelchair)?: A Little Help needed standing up from a chair using your arms (e.g., wheelchair or bedside chair)?: A Little Help needed to walk in hospital room?: A Little Help needed climbing 3-5 steps with a railing? : A Lot 6 Click Score: 17    End of Session Equipment Utilized During Treatment: Gait belt Activity Tolerance: Patient tolerated treatment well Patient left: in bed;with call bell/phone within reach;with bed alarm set   PT Visit Diagnosis: Unsteadiness on feet (R26.81);Other abnormalities of gait and mobility (R26.89);Muscle weakness (generalized) (M62.81);Difficulty in walking, not elsewhere classified (R26.2)     Time: 7356-7014 PT Time Calculation (min) (ACUTE ONLY): 10 min  Charges:  $Gait Training: 8-22 mins                         Doreatha Massed, PT Acute Rehabilitation  Office: [phone] Pager: [phone]

## 2021-03-14 NOTE — H&P (Incomplete)
Physical Medicine and Rehabilitation Admission H&P    Chief Complaint  Patient presents with   Rectal Bleeding  : HPI: Jennifer [name] is a 50 year old right-handed limited English speaking female with complicated medical history of atrial fibrillation maintained on Eliquis, non-STEMI 10/22, left eye visual impairment/glaucoma followed by [facility], hypertension, hyperlipidemia, diabetes mellitus, COVID-19 06/15/2020, closed trimalleolar right ankle fracture 05/02/2020, medical noncompliance.  Recent admission was a long hospital 02/25/2021 to 03/05/2021 for multifocal CVA with concern for infectious/inflammatory etiology as well as possible enhancing occipital lobe lesion and did receive lumbar puncture and ruled out meningitis as well as subarachnoid hemorrhage.  Hospital course complicated by urinary retention as well as AKI.  MRI of the brain study 02/28/2021 showed prominent diffuse FLAIR hyperintensity within the cerebral sulci representing proteinaceous material related to meningitis or less likely subarachnoid hemorrhage.  Multiple punctate  infarcts bilateral cerebral hemispheres noted and cerebellar hemisphere and an unchanged appearance of 2 enhancing lesions in the left occipital lobe representing possible subacute infarct with hemorrhagic transformation versus malignancy.  Plan was for rescheduling of repeat imaging in approximately 4 weeks with neurology follow-up as there are concerns for severely uncontrolled vascular factors..  Per chart review patient lives with her husband and children.  Uses a rolling walker for mobility.  Required some assist for lower body dressing.  1 level apartment with level entry.  Multiple family members in and out during the day to assist.  Presented 03/06/2021 with rectal bleeding and frank blood clots.  She was having some left flank pain.  CT of the abdomen pelvis showed prominent edematous wall thickening in the rectum with perirectal and presacral  edema.  Imaging suggesting infectious/inflammatory proctitis.  Gastroenterology services consulted Dr. Paulita Fujita.  Patient was placed on IV Cipro and Flagyl changed to ceftriaxone and Flagyl and later to [facility] and Flagyl x10-day course.  Admission labs hemoglobin 10.2 compared to 8.79 days prior, glucose 181, BUN 22, creatinine 1.15, fecal occult blood positive.  Conservative care per gastroenterology services no current plan for colonoscopy.  Patient's Eliquis initially was held discussed with neurology service Dr. Leonel Ramsay as well as gastroenterology services and was later cleared to resume Eliquis 03/12/2021.  Monitored for blood loss latest hemoglobin 8.2.  In regards to patient's AKI responded well to IV fluids and latest creatinine 1.00.  Patient's urinary retention continued to improve Foley catheter tube removed.  Urine culture did grow 80,000 yeast received 14 days of Diflucan.  Uncontrolled diabetes mellitus hemoglobin A1c 12.1 insulin therapy as directed.  She did have bouts of hypotension and antihypertensive medications were adjusted.  Therapy evaluations completed due to patient decreased functional mobility was to be admitted for a comprehensive rehab program.  Review of Systems  Constitutional:  Positive for malaise/fatigue. Negative for chills and fever.  HENT:  Negative for hearing loss.   Eyes:        Blindness left eye  Respiratory:  Negative for cough and shortness of breath.   Cardiovascular:  Positive for palpitations and leg swelling.  Gastrointestinal:  Positive for abdominal pain and blood in stool.  Genitourinary:  Positive for flank pain and urgency. Negative for dysuria.  Musculoskeletal:  Positive for joint pain and myalgias.  Skin:  Negative for rash.  Neurological:  Positive for weakness.  Psychiatric/Behavioral:  Positive for depression. The patient has insomnia.   All other systems reviewed and are negative. 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   Annotation: secondary to uveitis of unknown etiology Qualifier: Diagnosis of  By: Hilma Favors  DO, Beth     Hair loss    History of fracture of clavicle 05/18/2015   Hyperlipidemia    Hypertension    Hypertension associated with diabetes ([place]) 07/23/2016   Iron deficiency anemia 05/13/2013   Left anterior shoulder pain 07/06/2017   Pap smear abnormality of cervix with LGSIL    Routine/ritual circumcision    Type II diabetes mellitus ([place])    Uveitis    Past Surgical History:  Procedure Laterality Date   CESAREAN SECTION     x 1   EYE SURGERY Bilateral    Lasik   LEFT HEART CATH AND CORONARY ANGIOGRAPHY N/A 02/05/2021   Procedure: LEFT HEART CATH AND CORONARY ANGIOGRAPHY;  Surgeon: Dixie Dials, MD;  Location: [facility];  Service: Cardiovascular;  Laterality: N/A;   MULTIPLE TOOTH EXTRACTIONS     bottom denture   ORIF ANKLE FRACTURE Right 05/05/2020   Procedure: OPEN REDUCTION INTERNAL FIXATION (ORIF) ANKLE FRACTURE;  Surgeon: Shona Needles, MD;  Location: [facility];  Service: Orthopedics;  Laterality: Right;   Family History  Problem Relation Age of Onset   Diabetes Father    Hypertension Other    Social History:  reports that she has never smoked. She has never used smokeless tobacco. She reports that she does not drink alcohol and does not use drugs. Allergies: No Known Allergies Medications Prior to Admission  Medication Sig Dispense Refill   apixaban (ELIQUIS) 5 MG TABS tablet Take 1 tablet (5 mg total) by mouth 2 (two) times daily. 180 tablet 0   calcium citrate-vitamin D (CITRACAL+D) 315-200 MG-UNIT tablet Take 2 tablets by mouth 2 (two) times daily. 360 tablet 3    diclofenac Sodium (VOLTAREN) 1 % GEL Apply 2 g topically 4 (four) times daily as needed (foot pain).     dorzolamide-timolol (COSOPT) 22.3-6.8 MG/ML ophthalmic solution Place 1 drop into the left eye 2 (two) times daily.     feeding supplement (ENSURE ENLIVE / ENSURE PLUS) LIQD Take 237 mLs by mouth 2 (two) times daily between meals. 237 mL 12   gabapentin (NEURONTIN) 300 MG capsule Take 1 capsule (300 mg total) by mouth at bedtime. 90 capsule 3   insulin aspart (NOVOLOG) 100 UNIT/ML FlexPen Inject 3 Units into the skin 3 (three) times daily with meals. 80 mL 0   insulin detemir (LEVEMIR) 100 UNIT/ML FlexPen Inject 15 Units into the skin at bedtime 15 mL 0   linagliptin (TRADJENTA) 5 MG TABS tablet Take 1 tablet (5 mg total) by mouth daily. 90 tablet 0   metFORMIN (GLUCOPHAGE-XR) 500 MG 24 hr tablet TAKE 2 TABLETS (1,000 MG TOTAL) BY MOUTH IN THE MORNING AND AT BEDTIME. 360 tablet 1   Accu-Chek FastClix Lancets MISC Check blood sugar 4 times a day 200 each 7   ACCU-CHEK GUIDE test strip CHECK BLOOD SUGAR 4 TIMES PER DAY 150 strip 12   Blood Glucose Monitoring Suppl (ACCU-CHEK GUIDE) w/Device KIT 1 each by Does not apply route 4 (four) times daily. 1 kit 1   brimonidine (ALPHAGAN) 0.2 % ophthalmic solution Place 1 drop into the left eye 3 (three) times daily.  fluconazole (DIFLUCAN) 200 MG tablet Take 1 tablet (200 mg total) by mouth daily for 12 days. 12 tablet 0   Insulin Pen Needle (B-D UF III MINI PEN NEEDLES) 31G X 5 MM MISC Use pen needle daily for injections 100 each 2   Insulin Pen Needle (PENTIPS) 32G X 4 MM MISC Use as directed with insulin pen 100 each 3    Drug Regimen Review Drug regimen was reviewed and remains appropriate with no significant issues identified  Home: Home Living Family/patient expects to be discharged to:: Private residence Living Arrangements: Children, Spouse/significant other Available Help at Discharge: Family, Available 24 hours/day Type of Home:  Apartment Home Access: Level entry Home Layout: One level Bathroom Shower/Tub: Chiropodist: Standard Bathroom Accessibility: Yes Home Equipment: Conservation officer, nature (2 wheels), Wheelchair - manual, BSC/3in1 Additional Comments: acces to RW, and chair for tub (a normal black chair per daughter)  Lives With: Son, Daughter, Spouse   Functional History: Prior Function Prior Level of Function : Needs assist Physical Assist : ADLs (physical) ADLs (physical): Bathing, Dressing, IADLs (tub transfers) Mobility Comments: Uses RW for mobility, uses WC outside for home ADLs Comments: Required assist for tub transfers and for LB dressing, families does household chores  Functional Status:  Mobility: Bed Mobility Overal bed mobility: Needs Assistance Bed Mobility: Supine to Sit Supine to sit: Supervision General bed mobility comments: sittign EOB with son present, alarm on Transfers Overall transfer level: Needs assistance Equipment used: Rolling walker (2 wheels) Transfers: Sit to/from Stand Sit to Stand: Min guard General transfer comment: min/guard to transfer from recliner and to bed, repeated for instruction. verbal cues for hand placement, control of descent and RW positioning Ambulation/Gait Ambulation/Gait assistance: Min guard, Min assist Gait Distance (Feet): 60 Feet Assistive device: Rolling walker (2 wheels) Gait Pattern/deviations: Step-through pattern, Step-to pattern, Decreased stride length, Decreased dorsiflexion - right, Decreased dorsiflexion - left General Gait Details: R foot drop with slight hip external rotation and  decr circumduction compensations, minimal L dorsiflexion with foot clearance, able to amb distance above with one standing rest Gait velocity: decreased    ADL: ADL Overall ADL's : Needs assistance/impaired Eating/Feeding: Set up, Sitting Grooming: Set up, Sitting Upper Body Bathing: Set up, Sitting Lower Body Bathing: Minimal  assistance, Sit to/from stand Upper Body Dressing : Sitting, Set up Upper Body Dressing Details (indicate cue type and reason): min assist for fasteners Lower Body Dressing: Sit to/from stand, Moderate assistance Lower Body Dressing Details (indicate cue type and reason): Patient able to don socks with figure four method - did drop sock needing assistance to pick it up from floor. requires assistance to don underwear. Toilet Transfer: Min guard, Grab bars, Rolling walker (2 wheels), Ambulation Toilet Transfer Details (indicate cue type and reason): with increased time and education to keep walker with her with turning. Toileting- Water quality scientist and Hygiene: Min guard, Sit to/from stand Toileting - Water quality scientist Details (indicate cue type and reason): patient needed min guard with education to keep RW close. Functional mobility during ADLs: Min guard, Rolling walker (2 wheels) General ADL Comments: Reliance on walker with ADls limits upper extremity use for ADLs.  Cognition: Cognition Overall Cognitive Status: Within Functional Limits for tasks assessed Orientation Level: Oriented X4 Cognition Arousal/Alertness: Awake/alert Behavior During Therapy: WFL for tasks assessed/performed Overall Cognitive Status: Within Functional Limits for tasks assessed Area of Impairment: Safety/judgement General Comments: patient was noted to be impulsive with ability to follow all commands. Difficult to assess due to: Non-English  speaking  Physical Exam: Blood pressure 138/69, pulse 80, temperature 98.7 F (37.1 C), temperature source Oral, resp. rate 18, height _0  (1.702 m), weight 68.7 kg, last menstrual period 08/21/2016, SpO2 100 %. Physical Exam Neurological:     Comments: Patient is resting comfortably.  Makes eye contact with examiner.  Limited English speaking primarily speaks Arabic.  Follows simple commands.  She does have family at bedside.    Results for orders placed or  performed during the hospital encounter of 03/06/21 (from the past 48 hour(s))  Glucose, capillary     Status: Abnormal   Collection Time: 03/12/21  7:49 AM  Result Value Ref Range   Glucose-Capillary 221 (H) 70 - 99 mg/dL    Comment: Glucose reference range applies only to samples taken after fasting for at least 8 hours.  Glucose, capillary     Status: Abnormal   Collection Time: 03/12/21 11:24 AM  Result Value Ref Range   Glucose-Capillary 164 (H) 70 - 99 mg/dL    Comment: Glucose reference range applies only to samples taken after fasting for at least 8 hours.  Glucose, capillary     Status: Abnormal   Collection Time: 03/12/21  4:31 PM  Result Value Ref Range   Glucose-Capillary 159 (H) 70 - 99 mg/dL    Comment: Glucose reference range applies only to samples taken after fasting for at least 8 hours.  Glucose, capillary     Status: Abnormal   Collection Time: 03/12/21  8:52 PM  Result Value Ref Range   Glucose-Capillary 283 (H) 70 - 99 mg/dL    Comment: Glucose reference range applies only to samples taken after fasting for at least 8 hours.  CBC with Differential/Platelet     Status: Abnormal   Collection Time: 03/13/21  4:24 AM  Result Value Ref Range   WBC 4.9 4.0 - 10.5 K/uL   RBC 3.04 (L) 3.87 - 5.11 MIL/uL   Hemoglobin 8.5 (L) 12.0 - 15.0 g/dL   HCT 27.5 (L) 36.0 - 46.0 %   MCV 90.5 80.0 - 100.0 fL   MCH 28.0 26.0 - 34.0 pg   MCHC 30.9 30.0 - 36.0 g/dL   RDW 12.4 11.5 - 15.5 %   Platelets 194 150 - 400 K/uL   nRBC 0.0 0.0 - 0.2 %   Neutrophils Relative % 50 %   Neutro Abs 2.4 1.7 - 7.7 K/uL   Lymphocytes Relative 40 %   Lymphs Abs 1.9 0.7 - 4.0 K/uL   Monocytes Relative 6 %   Monocytes Absolute 0.3 0.1 - 1.0 K/uL   Eosinophils Relative 3 %   Eosinophils Absolute 0.2 0.0 - 0.5 K/uL   Basophils Relative 1 %   Basophils Absolute 0.0 0.0 - 0.1 K/uL   Immature Granulocytes 0 %   Abs Immature Granulocytes 0.02 0.00 - 0.07 K/uL    Comment: Performed at [facility], [place] [address] [place],  [address]  Comprehensive metabolic panel     Status: Abnormal   Collection Time: 03/13/21  4:24 AM  Result Value Ref Range   Sodium 135 135 - 145 mmol/L   Potassium 4.4 3.5 - 5.1 mmol/L   Chloride 106 98 - 111 mmol/L   CO2 25 22 - 32 mmol/L   Glucose, Bld 308 (H) 70 - 99 mg/dL    Comment: Glucose reference range applies only to samples taken after fasting for at least 8 hours.   BUN 37 (H) 6 - 20 mg/dL  Creatinine, Ser 1.09 (H) 0.44 - 1.00 mg/dL   Calcium 8.4 (L) 8.9 - 10.3 mg/dL   Total Protein 5.9 (L) 6.5 - 8.1 g/dL   Albumin 2.7 (L) 3.5 - 5.0 g/dL   AST 24 15 - 41 U/L   ALT 15 0 - 44 U/L   Alkaline Phosphatase 109 38 - 126 U/L   Total Bilirubin 0.3 0.3 - 1.2 mg/dL   GFR, Estimated >60 >60 mL/min    Comment: (NOTE) Calculated using the CKD-EPI Creatinine Equation (2021)    Anion gap 4 (L) 5 - 15    Comment: Performed at [facility], [place] [address] [place], [place] [address]  Magnesium     Status: None   Collection Time: 03/13/21  4:24 AM  Result Value Ref Range   Magnesium 2.0 1.7 - 2.4 mg/dL    Comment: Performed at [facility], [place] [address], [place], [place] [address]  Phosphorus     Status: None   Collection Time: 03/13/21  4:24 AM  Result Value Ref Range   Phosphorus 3.2 2.5 - 4.6 mg/dL    Comment: Performed at [facility], [place] [address] [place], [place] [address]  Glucose, capillary     Status: Abnormal   Collection Time: 03/13/21  7:35 AM  Result Value Ref Range   Glucose-Capillary 207 (H) 70 - 99 mg/dL    Comment: Glucose reference range applies only to samples taken after fasting for at least 8 hours.  Glucose, capillary     Status: Abnormal   Collection Time: 03/13/21 11:07 AM  Result Value Ref Range   Glucose-Capillary 136 (H) 70 - 99 mg/dL    Comment: Glucose reference range applies only to samples taken after fasting for at least 8 hours.    Comment 1 Notify RN   Glucose, capillary     Status: Abnormal   Collection Time: 03/13/21  4:14 PM  Result Value Ref Range   Glucose-Capillary 249 (H) 70 - 99 mg/dL    Comment: Glucose reference range applies only to samples taken after fasting for at least 8 hours.  Glucose, capillary     Status: Abnormal   Collection Time: 03/13/21  8:41 PM  Result Value Ref Range   Glucose-Capillary 155 (H) 70 - 99 mg/dL    Comment: Glucose reference range applies only to samples taken after fasting for at least 8 hours.  CBC with Differential/Platelet     Status: Abnormal   Collection Time: 03/14/21  4:31 AM  Result Value Ref Range   WBC 5.3 4.0 - 10.5 K/uL   RBC 2.91 (L) 3.87 - 5.11 MIL/uL   Hemoglobin 8.1 (L) 12.0 - 15.0 g/dL   HCT 25.7 (L) 36.0 - 46.0 %   MCV 88.3 80.0 - 100.0 fL   MCH 27.8 26.0 - 34.0 pg   MCHC 31.5 30.0 - 36.0 g/dL   RDW 12.5 11.5 - 15.5 %   Platelets 185 150 - 400 K/uL   nRBC 0.0 0.0 - 0.2 %   Neutrophils Relative % 39 %   Neutro Abs 2.0 1.7 - 7.7 K/uL   Lymphocytes Relative 49 %   Lymphs Abs 2.6 0.7 - 4.0 K/uL   Monocytes Relative 7 %   Monocytes Absolute 0.3 0.1 - 1.0 K/uL   Eosinophils Relative 4 %   Eosinophils Absolute 0.2 0.0 - 0.5 K/uL   Basophils Relative 1 %   Basophils Absolute 0.1 0.0 - 0.1 K/uL   Immature  Granulocytes 0 %   Abs Immature Granulocytes 0.02 0.00 - 0.07 K/uL    Comment: Performed at [facility], [place] [address], [place], [place] [address]  Comprehensive metabolic panel     Status: Abnormal   Collection Time: 03/14/21  4:31 AM  Result Value Ref Range   Sodium 140 135 - 145 mmol/L   Potassium 4.2 3.5 - 5.1 mmol/L   Chloride 110 98 - 111 mmol/L   CO2 25 22 - 32 mmol/L   Glucose, Bld 131 (H) 70 - 99 mg/dL    Comment: Glucose reference range applies only to samples taken after fasting for at least 8 hours.   BUN 34 (H) 6 - 20 mg/dL   Creatinine, Ser 0.91 0.44 - 1.00 mg/dL   Calcium 9.0 8.9 - 10.3 mg/dL   Total Protein  6.1 (L) 6.5 - 8.1 g/dL   Albumin 2.7 (L) 3.5 - 5.0 g/dL   AST 21 15 - 41 U/L   ALT 16 0 - 44 U/L   Alkaline Phosphatase 81 38 - 126 U/L   Total Bilirubin 0.2 (L) 0.3 - 1.2 mg/dL   GFR, Estimated >60 >60 mL/min    Comment: (NOTE) Calculated using the CKD-EPI Creatinine Equation (2021)    Anion gap 5 5 - 15    Comment: Performed at [facility], [place] [address], [place], [place] [address]  Magnesium     Status: None   Collection Time: 03/14/21  4:31 AM  Result Value Ref Range   Magnesium 2.0 1.7 - 2.4 mg/dL    Comment: Performed at [facility], [place] [address], [place], [place] [address]  Phosphorus     Status: None   Collection Time: 03/14/21  4:31 AM  Result Value Ref Range   Phosphorus 3.3 2.5 - 4.6 mg/dL    Comment: Performed at [facility], [place] [address], [place], [place] [address]   No results found.     Medical Problem List and Plan: 1.  Debility functional deficits secondary to proctitis/colitis/GI bleed/multi medical  -patient may *** shower  -ELOS/Goals: *** 2.  Antithrombotics: -DVT/anticoagulation:  Pharmaceutical: Other (comment) Eliquis  -antiplatelet therapy: N/A 3. Pain Management: Neurontin 300 mg nightly 4. Mood: Provide emotional support  -antipsychotic agents: N/A 5. Neuropsych: This patient is capable of making decisions on her own behalf. 6. Skin/Wound Care: Routine skin checks 7. Fluids/Electrolytes/Nutrition: Routine in and outs with follow-up chemistries 8.  Proctitis/colitis/GI bleed.  Follow-up GI Dr. Paulita Fujita.  Completed course of Omnicef and Flagyl.  Follow-up CBC 9.  Prior CVA/abnormal MRI.  Recent admission 02/25/2021 to 03/05/2021.  Plan repeat MRI 4 weeks per neurology services 10.  Atrial fibrillation.  Cardiac rate controlled.  Continue Eliquis 11.  Hypertension.  Norvasc 2.5 mg twice daily.  Monitor with increased mobility 12.  Uncontrolled diabetes mellitus.  Hemoglobin A1c greater  than 12.  Levemir 18 units nightly.  Check blood sugars before meals and at bedtime.  Diabetic teaching 13.  AKI versus CKD.  Follow-up chemistries 14.  Glaucoma.  Left eye blindness.  Follow Dr. Catalina Antigua [facility].  Continue eyedrops 15.  Urinary retention.  Foley tube removed.  Follow-up outpatient urology as needed 16.  Medical noncompliance.  Counseling Cathlyn Parsons, PA-C 03/14/2021

## 2021-03-14 NOTE — TOC Transition Note (Signed)
Transition of Care [facility]) - CM/SW Discharge Note   Patient Details  Name: Jennifer [name] MRN: [id number] Date of Birth: 1970-07-07  Transition of Care [facility]) CM/SW Contact:  Dessa Phi, RN Phone Number: 03/14/2021, 2:48 PM   Clinical Narrative:   Noted no CIR. Spoke to son [name] informed of No HHC agerncy to accept  medicaid for HHPT-agreed to otpt PT-referral placed. Family will provide own transport home. No further CM needs.    Final next level of care: OP Rehab Barriers to Discharge: No Barriers Identified   Patient Goals and CMS Choice        Discharge Placement                       Discharge Plan and Services                                     Social Determinants of Health (SDOH) Interventions     Readmission Risk Interventions Readmission Risk Prevention Plan 04/12/2020  Transportation Screening Complete  PCP or Specialist Appt within 3-5 Days Complete  HRI or [place] Complete  Social Work Consult for [place] Planning/Counseling Complete  Palliative Care Screening Not Applicable  Medication Review Press photographer) Complete  Some recent data might be hidden

## 2021-03-15 LAB — GLUCOSE, CAPILLARY
Glucose-Capillary: 144 mg/dL — ABNORMAL HIGH (ref 70–99)
Glucose-Capillary: 216 mg/dL — ABNORMAL HIGH (ref 70–99)
Glucose-Capillary: 251 mg/dL — ABNORMAL HIGH (ref 70–99)
Glucose-Capillary: 113 mg/dL — ABNORMAL HIGH (ref 70–99)

## 2021-03-15 MED ORDER — DIPHENOXYLATE-ATROPINE 2.5-0.025 MG PO TABS
1.0000 | ORAL_TABLET | Freq: Four times a day (QID) | ORAL | Status: DC | PRN
  Administered 2021-03-15 – 2021-03-16 (×3): 1 via ORAL
  Filled 2021-03-15 (×3): qty 1

## 2021-03-15 MED ORDER — LOPERAMIDE HCL 2 MG PO CAPS
2.0000 mg | ORAL_CAPSULE | Freq: Once | ORAL | Status: AC
  Administered 2021-03-15: 2 mg via ORAL
  Filled 2021-03-15: qty 1

## 2021-03-15 NOTE — Progress Notes (Signed)
Progress Note    Jennifer [name]   [id number]  DOB: [birth year]  DOA: 03/06/2021     9 Date of Service: 03/15/2021   Clinical Course The patient is a 50 year old Arabic female with a past medical history significant for but not limited to atrial fibrillation on anticoagulation with Eliquis, history of left eye visual impairment, hypertension, hyperlipidemia as well as diabetes mellitus type 2 who was recently admitted to [facility] from 02/25/2021 until 03/05/2021 for multifocal CVAs who now presents to the hospital for rectal bleeding.  She states that she is okay when she left the hospital the day before yesterday but when she got home her stomach was hurting and she complained about this in the hospital and told that it might be for medications.  He states that it started around 2:30 PM and she states that she started having 3-4 stools overnight and noticed blood in the stool by the end she had frank blood clots last time.  She was having left flank pain and her last dose of Eliquis was yesterday morning and did not take any medications last night.  Upon arrival to the ED she had 4 bloody stools and has a Foley catheter for retention.  She was heme positive and GI was consulted.  CT of the abdomen pelvis showed concern for colitis and proctitis so she was started on IV Rocephin and Flagyl.  Rectal bleeding improved.  GI evaluated and recommending placing this patient on soft diet which she is tolerating well.   She is medically stable to D/C to CIR but currently awaiting Acceptance. We have resumed her Anticoagulation 03/12/21 and will monitor for bleeding. Has had no further bleeding   Assessment and Plan Proctitis/colitis with GI bleed - she will complete her course of Cefdinir and Flagyl today-symptoms improved  Diarrhea - having loose stools today- have given a dose of Imodium today- will follow  Atrial Fibrillation -She was previously on metoprolol but this was held during  last admission due to soft blood pressure    Uncontrolled Diabetes mellitus type 2 with hyperglycemia -Continue sliding scale insulin and Levemir  Essential hypertension Continue amlodipine and as needed hydralazine  CKD stage III - Follow  Subjective: having some loose stools today- has had 4 episodes today  Objective Vitals:   03/14/21 1416 03/14/21 2047 03/15/21 0413 03/15/21 1301  BP: 134/60 (!) 152/76 (!) 145/66 123/75  Pulse: 82 92 81 79  Resp: 17 17  18   Temp: 97.8 F (36.6 C) 98.4 F (36.9 C) 98.4 F (36.9 C) 97.6 F (36.4 C)  TempSrc: Oral Oral Oral Oral  SpO2: 99% 100% 100% 100%  Weight:      Height:       74.2 kg     Exam Physical Exam  General exam: Appears comfortable  HEENT: PERRLA, oral mucosa moist, no sclera icterus or thrush Respiratory system: Clear to auscultation. Respiratory effort normal. Cardiovascular system: S1 & S2 heard, regular rate and rhythm Gastrointestinal system: Abdomen soft, non-tender, nondistended. Normal bowel sounds   Central nervous system: Alert and oriented. No focal neurological deficits. Extremities: No cyanosis, clubbing or edema Skin: No rashes or ulcers Psychiatry:  Mood & affect appropriate.    Labs / Other Information My review of labs, imaging, notes and other tests shows no new significant findings.    Disposition Plan: Status is: Inpatient  Remains inpatient appropriate because: Awaiting: Inpatient rehab       Time spent: 30 minutes [facility] 03/15/2021, 2:20  PM   

## 2021-03-16 LAB — GLUCOSE, CAPILLARY
Glucose-Capillary: 90 mg/dL (ref 70–99)
Glucose-Capillary: 196 mg/dL — ABNORMAL HIGH (ref 70–99)
Glucose-Capillary: 142 mg/dL — ABNORMAL HIGH (ref 70–99)
Glucose-Capillary: 154 mg/dL — ABNORMAL HIGH (ref 70–99)

## 2021-03-16 LAB — BASIC METABOLIC PANEL
Anion gap: 5 (ref 5–15)
BUN: 37 mg/dL — ABNORMAL HIGH (ref 6–20)
CO2: 24 mmol/L (ref 22–32)
Calcium: 8.8 mg/dL — ABNORMAL LOW (ref 8.9–10.3)
Chloride: 109 mmol/L (ref 98–111)
Creatinine, Ser: 1 mg/dL (ref 0.44–1.00)
GFR, Estimated: >60 mL/min (ref >60)
Glucose, Bld: 100 mg/dL — ABNORMAL HIGH (ref 70–99)
Potassium: 4 mmol/L (ref 3.5–5.1)
Sodium: 138 mmol/L (ref 135–145)

## 2021-03-16 LAB — CBC
HCT: 27 % — ABNORMAL LOW (ref 36.0–46.0)
Hemoglobin: 8.2 g/dL — ABNORMAL LOW (ref 12.0–15.0)
MCH: 27.7 pg (ref 26.0–34.0)
MCHC: 30.4 g/dL (ref 30.0–36.0)
MCV: 91.2 fL (ref 80.0–100.0)
Platelets: 169 10*3/uL (ref 150–400)
RBC: 2.96 MIL/uL — ABNORMAL LOW (ref 3.87–5.11)
RDW: 12.8 % (ref 11.5–15.5)
WBC: 7.9 10*3/uL (ref 4.0–10.5)
nRBC: 0 % (ref 0.0–0.2)

## 2021-03-16 MED ORDER — SODIUM CHLORIDE 0.9 % IV SOLN: INTRAVENOUS | Status: DC

## 2021-03-16 MED ORDER — DIPHENOXYLATE-ATROPINE 2.5-0.025 MG PO TABS
2.0000 | ORAL_TABLET | Freq: Once | ORAL | Status: AC
  Administered 2021-03-16: 2 via ORAL
  Filled 2021-03-16: qty 2

## 2021-03-16 NOTE — Progress Notes (Signed)
Over the course of the night, pt reported that she has had multiple bowel movements with 10 stool episodes between 1900 to 2200, reported by pt and pt daughter. No signs of bleeding present. Pt was given PRN dose of lomitil.  Early this morning pt reported bowel movements episodes ( about 2) has slowed down a little but not much to satisfaction, Prn dose of lomitil given

## 2021-03-16 NOTE — Progress Notes (Signed)
Progress Note    Jennifer [name]   [id number]  DOB: 1971-02-08  DOA: 03/06/2021     10 Date of Service: 03/16/2021   Clinical Course The patient is a 50 year old Arabic female with a past medical history significant for but not limited to atrial fibrillation on anticoagulation with Eliquis, history of left eye visual impairment, hypertension, hyperlipidemia as well as diabetes mellitus type 2 who was recently admitted to [facility] from 02/25/2021 until 03/05/2021 for multifocal CVAs who now presents to the hospital for rectal bleeding.  She states that she is okay when she left the hospital the day before yesterday but when she got home her stomach was hurting and she complained about this in the hospital and told that it might be for medications.  He states that it started around 2:30 PM and she states that she started having 3-4 stools overnight and noticed blood in the stool by the end she had frank blood clots last time.  She was having left flank pain and her last dose of Eliquis was yesterday morning and did not take any medications last night.  Upon arrival to the ED she had 4 bloody stools and has a Foley catheter for retention.  She was heme positive and GI was consulted.  CT of the abdomen pelvis showed concern for colitis and proctitis so she was started on IV Rocephin and Flagyl.  Rectal bleeding improved.  GI evaluated and recommending placing this patient on soft diet which she is tolerating well.   She is medically stable to D/C to CIR but currently awaiting Acceptance. We have resumed her Anticoagulation 03/12/21 and will monitor for bleeding. Has had no further bleeding   Assessment and Plan Proctitis/colitis with GI bleed - she completed her course of Cefdinir and Flagyl on 11/24   Diarrhea - has been having loose stools - have given a dose of Imodium and then Lomotil- no significant abdominal tenderness today - have started slow IVF to prevent dehydration - WBC 7.9  & 99.1 today - will follow  Atrial Fibrillation -She was previously on metoprolol but currently on no rate controlling agent - cont Eliquis  Uncontrolled Diabetes mellitus type 2 with hyperglycemia -Continue sliding scale insulin and Levemir  Essential hypertension Continue amlodipine and as needed hydralazine  CKD stage III - Follow  Subjective:  continues to have some loose stools - she had 8 episodes overnight  Objective Vitals:   03/15/21 1301 03/15/21 1955 03/16/21 0427 03/16/21 1328  BP: 123/75 131/67 (!) 112/59 (!) 118/54  Pulse: 79 84 78 78  Resp: 18 17 17 20   Temp: 97.6 F (36.4 C) 98.8 F (37.1 C) 98.9 F (37.2 C) 99.1 F (37.3 C)  TempSrc: Oral Oral Oral Oral  SpO2: 100% 99% 99% 100%  Weight:      Height:       74.2 kg     Exam Physical Exam  General exam: Appears comfortable  HEENT: PERRLA, oral mucosa moist, no sclera icterus or thrush Respiratory system: Clear to auscultation. Respiratory effort normal. Cardiovascular system: S1 & S2 heard, regular rate and rhythm Gastrointestinal system: Abdomen soft, non-tender, nondistended. Normal bowel sounds   Central nervous system: Alert and oriented. No focal neurological deficits. Extremities: No cyanosis, clubbing or edema Skin: No rashes or ulcers Psychiatry:  Mood & affect appropriate.    Labs / Other Information My review of labs, imaging, notes and other tests shows no new significant findings.    Disposition Plan: Status is:  Inpatient  Remains inpatient appropriate because: Awaiting: Inpatient rehab       Time spent: 30 minutes [facility] 03/16/2021, 3:13 PM

## 2021-03-16 NOTE — Progress Notes (Signed)
Inpatient Rehab Admissions Coordinator:   I have a bed for this pt. On CIR tomorrow, Saturday 03/17/21. RN may call report to [phone].  Clemens Catholic, [place], [place] Admissions Coordinator  [phone] ([place]) [phone] (office)

## 2021-03-16 NOTE — TOC Progression Note (Signed)
Transition of Care [facility]) - Progression Note    Patient Details  Name: Jennifer [name] MRN: [id number] Date of Birth: 1971/01/29  Transition of Care [facility]) CM/SW Contact  Nitzia Perren, Juliann Pulse, RN Phone Number: 03/16/2021, 2:35 PM  Clinical Narrative: CIR accepted for d/c in am. Nsg to manage all transportation-[facility]. No further CM needs.      Expected Discharge Plan: IP Rehab Facility Barriers to Discharge: Continued Medical Work up  Expected Discharge Plan and Services Expected Discharge Plan: [place]         Expected Discharge Date:  (unknown)                                     Social Determinants of Health (SDOH) Interventions    Readmission Risk Interventions Readmission Risk Prevention Plan 04/12/2020  Transportation Screening Complete  PCP or Specialist Appt within 3-5 Days Complete  HRI or [place] Complete  Social Work Consult for [place] Planning/Counseling Complete  Palliative Care Screening Not Applicable  Medication Review [employer]) Complete  Some recent data might be hidden

## 2021-03-16 NOTE — Progress Notes (Signed)
Physical Therapy Treatment Patient Details Name: Jennifer [name] MRN: [id number] DOB: 12/07/1970 Today's Date: 03/16/2021   History of Present Illness 50 y.o. female admitted 03/06/21 with proctitis. Pt was admitted from 02/25/21-03/05/21 at [facility] for multifocal CVAs. PMH: afib, L eye visual impairment, HTN, HLD, diabetes, L heart cath 02/05/21    PT Comments    Pt progressing toward goals. Noted to exhibit RLE fatigue more quickly today with min assist needed to maneuver RW and maintain balance however no rests needed during gait distance. Continue to recommend CIR post acute.    Recommendations for follow up therapy are one component of a multi-disciplinary discharge planning process, led by the attending physician.  Recommendations may be updated based on patient status, additional functional criteria and insurance authorization.  Follow Up Recommendations  Acute inpatient rehab (3hours/day)     Assistance Recommended at Discharge Frequent or constant Supervision/Assistance  Equipment Recommendations  None recommended by PT    Recommendations for Other Services       Precautions / Restrictions Precautions Precautions: Fall Restrictions Weight Bearing Restrictions: No     Mobility  Bed Mobility Overal bed mobility: Needs Assistance Bed Mobility: Supine to Sit     Supine to sit: Supervision     General bed mobility comments: for safety, cues to remain seated and not stand without assist    Transfers Overall transfer level: Needs assistance Equipment used: Rolling walker (2 wheels) Transfers: Sit to/from Stand Sit to Stand: Min assist           General transfer comment: Cues for safety, hand placement. Assist with anterior-superior wt shift and to steady on transition to RW    Ambulation/Gait Ambulation/Gait assistance: Min assist Gait Distance (Feet): 120 Feet Assistive device: Rolling walker (2 wheels) Gait Pattern/deviations: Step-through pattern;Step-to  pattern;Decreased stride length;Decreased dorsiflexion - right;Decreased dorsiflexion - left Gait velocity: decreased     General Gait Details: R foot drop with  hip external rotation, minimal L dorsiflexion with foot clearance. frequent visual cues to incr step length on R and bring RLE inside frame of RW. drifting to R, requiring min assist to [employer] RW   Stairs             Wheelchair Mobility    Modified Rankin (Stroke Patients Only)       Balance                                            Cognition Arousal/Alertness: Awake/alert Behavior During Therapy: WFL for tasks assessed/performed Overall Cognitive Status: Within Functional Limits for tasks assessed                           Safety/Judgement: Decreased awareness of safety;Decreased awareness of deficits     General Comments: attempts to sit before backing up to recliner, decr safety, decr awareness of RLE beign outside [employer]        Exercises General Exercises - Lower Extremity Long Arc Quad: AROM;Both;10 reps;Seated    General Comments        Pertinent Vitals/Pain Pain Assessment: No/denies pain    Home Living                          Prior Function            PT Goals (current goals can  now be found in the care plan section) Acute Rehab PT Goals Patient Stated Goal: get stronger and walk PT Goal Formulation: With patient/family Time For Goal Achievement: 03/23/21 Potential to Achieve Goals: Good Progress towards PT goals: Progressing toward goals    Frequency    Min 3X/week      PT Plan Current plan remains appropriate    Co-evaluation              AM-PAC PT "6 Clicks" Mobility   Outcome Measure  Help needed turning from your back to your side while in a flat bed without using bedrails?: A Little Help needed moving from lying on your back to sitting on the side of a flat bed without using bedrails?: A Little Help needed  moving to and from a bed to a chair (including a wheelchair)?: A Little Help needed standing up from a chair using your arms (e.g., wheelchair or bedside chair)?: A Little Help needed to walk in hospital room?: A Little Help needed climbing 3-5 steps with a railing? : A Lot 6 Click Score: 17    End of Session Equipment Utilized During Treatment: Gait belt Activity Tolerance: Patient tolerated treatment well Patient left: in chair;with call bell/phone within reach;with chair alarm set;with family/visitor present Nurse Communication: Mobility status PT Visit Diagnosis: Unsteadiness on feet (R26.81);Other abnormalities of gait and mobility (R26.89);Muscle weakness (generalized) (M62.81);Difficulty in walking, not elsewhere classified (R26.2)     Time: 5885-0277 PT Time Calculation (min) (ACUTE ONLY): 24 min  Charges:  $Gait Training: 23-37 mins                     Baxter Flattery, PT  Acute Rehab Dept ([place]) [phone] Pager [phone]  03/16/2021    [facility] 03/16/2021, 2:05 PM

## 2021-03-17 ENCOUNTER — Inpatient Hospital Stay (HOSPITAL_COMMUNITY)
Admission: RE | Admit: 2021-03-17 | Discharge: 2021-04-04 | DRG: 945 | Disposition: A | Discharge status: Home / Self Care | Payer: Medicaid Other | Source: Other Acute Inpatient Hospital | Attending: Physical Medicine and Rehabilitation | Admitting: Physical Medicine and Rehabilitation

## 2021-03-17 ENCOUNTER — Other Ambulatory Visit: Payer: Self-pay

## 2021-03-17 ENCOUNTER — Encounter (HOSPITAL_COMMUNITY): Payer: Self-pay | Admitting: Physical Medicine and Rehabilitation

## 2021-03-17 DIAGNOSIS — Z833 Family history of diabetes mellitus: Secondary | ICD-10-CM

## 2021-03-17 DIAGNOSIS — E785 Hyperlipidemia, unspecified: Secondary | ICD-10-CM | POA: Diagnosis present

## 2021-03-17 DIAGNOSIS — E1165 Type 2 diabetes mellitus with hyperglycemia: Secondary | ICD-10-CM

## 2021-03-17 DIAGNOSIS — H5462 Unqualified visual loss, left eye, normal vision right eye: Secondary | ICD-10-CM | POA: Diagnosis present

## 2021-03-17 DIAGNOSIS — M21371 Foot drop, right foot: Secondary | ICD-10-CM | POA: Diagnosis present

## 2021-03-17 DIAGNOSIS — I214 Non-ST elevation (NSTEMI) myocardial infarction: Secondary | ICD-10-CM | POA: Diagnosis present

## 2021-03-17 DIAGNOSIS — R52 Pain, unspecified: Secondary | ICD-10-CM

## 2021-03-17 DIAGNOSIS — Z91199 Patient's noncompliance with other medical treatment and regimen due to unspecified reason: Secondary | ICD-10-CM

## 2021-03-17 DIAGNOSIS — Z794 Long term (current) use of insulin: Secondary | ICD-10-CM | POA: Diagnosis not present

## 2021-03-17 DIAGNOSIS — Z8616 Personal history of COVID-19: Secondary | ICD-10-CM

## 2021-03-17 DIAGNOSIS — K529 Noninfective gastroenteritis and colitis, unspecified: Secondary | ICD-10-CM | POA: Diagnosis present

## 2021-03-17 DIAGNOSIS — H409 Unspecified glaucoma: Secondary | ICD-10-CM | POA: Diagnosis present

## 2021-03-17 DIAGNOSIS — R269 Unspecified abnormalities of gait and mobility: Secondary | ICD-10-CM | POA: Diagnosis present

## 2021-03-17 DIAGNOSIS — I152 Hypertension secondary to endocrine disorders: Secondary | ICD-10-CM | POA: Diagnosis present

## 2021-03-17 DIAGNOSIS — M24572 Contracture, left ankle: Secondary | ICD-10-CM | POA: Diagnosis present

## 2021-03-17 DIAGNOSIS — R5381 Other malaise: Secondary | ICD-10-CM | POA: Diagnosis present

## 2021-03-17 DIAGNOSIS — G939 Disorder of brain, unspecified: Secondary | ICD-10-CM

## 2021-03-17 DIAGNOSIS — I4891 Unspecified atrial fibrillation: Secondary | ICD-10-CM | POA: Diagnosis present

## 2021-03-17 DIAGNOSIS — E663 Overweight: Secondary | ICD-10-CM | POA: Diagnosis present

## 2021-03-17 DIAGNOSIS — N179 Acute kidney failure, unspecified: Secondary | ICD-10-CM | POA: Diagnosis present

## 2021-03-17 DIAGNOSIS — M24571 Contracture, right ankle: Secondary | ICD-10-CM | POA: Diagnosis present

## 2021-03-17 DIAGNOSIS — Z8249 Family history of ischemic heart disease and other diseases of the circulatory system: Secondary | ICD-10-CM | POA: Diagnosis not present

## 2021-03-17 DIAGNOSIS — Z7901 Long term (current) use of anticoagulants: Secondary | ICD-10-CM

## 2021-03-17 DIAGNOSIS — Z6827 Body mass index (BMI) 27.0-27.9, adult: Secondary | ICD-10-CM

## 2021-03-17 DIAGNOSIS — D62 Acute posthemorrhagic anemia: Secondary | ICD-10-CM | POA: Diagnosis present

## 2021-03-17 DIAGNOSIS — N189 Chronic kidney disease, unspecified: Secondary | ICD-10-CM

## 2021-03-17 DIAGNOSIS — R7309 Other abnormal glucose: Secondary | ICD-10-CM

## 2021-03-17 DIAGNOSIS — E1169 Type 2 diabetes mellitus with other specified complication: Secondary | ICD-10-CM | POA: Diagnosis present

## 2021-03-17 DIAGNOSIS — E11649 Type 2 diabetes mellitus with hypoglycemia without coma: Secondary | ICD-10-CM | POA: Diagnosis not present

## 2021-03-17 DIAGNOSIS — Z7984 Long term (current) use of oral hypoglycemic drugs: Secondary | ICD-10-CM

## 2021-03-17 DIAGNOSIS — E11319 Type 2 diabetes mellitus with unspecified diabetic retinopathy without macular edema: Secondary | ICD-10-CM

## 2021-03-17 DIAGNOSIS — M7502 Adhesive capsulitis of left shoulder: Secondary | ICD-10-CM | POA: Diagnosis present

## 2021-03-17 DIAGNOSIS — Z79899 Other long term (current) drug therapy: Secondary | ICD-10-CM

## 2021-03-17 LAB — GLUCOSE, CAPILLARY
Glucose-Capillary: 105 mg/dL — ABNORMAL HIGH (ref 70–99)
Glucose-Capillary: 182 mg/dL — ABNORMAL HIGH (ref 70–99)
Glucose-Capillary: 126 mg/dL — ABNORMAL HIGH (ref 70–99)
Glucose-Capillary: 150 mg/dL — ABNORMAL HIGH (ref 70–99)

## 2021-03-17 LAB — CBC
HCT: 28.2 % — ABNORMAL LOW (ref 36.0–46.0)
Hemoglobin: 8.7 g/dL — ABNORMAL LOW (ref 12.0–15.0)
MCH: 28.5 pg (ref 26.0–34.0)
MCHC: 30.9 g/dL (ref 30.0–36.0)
MCV: 92.5 fL (ref 80.0–100.0)
Platelets: 142 10*3/uL — ABNORMAL LOW (ref 150–400)
RBC: 3.05 MIL/uL — ABNORMAL LOW (ref 3.87–5.11)
RDW: 13 % (ref 11.5–15.5)
WBC: 8.3 10*3/uL (ref 4.0–10.5)
nRBC: 0 % (ref 0.0–0.2)

## 2021-03-17 LAB — BASIC METABOLIC PANEL
Anion gap: 7 (ref 5–15)
BUN: 30 mg/dL — ABNORMAL HIGH (ref 6–20)
CO2: 23 mmol/L (ref 22–32)
Calcium: 8.4 mg/dL — ABNORMAL LOW (ref 8.9–10.3)
Chloride: 107 mmol/L (ref 98–111)
Creatinine, Ser: 0.96 mg/dL (ref 0.44–1.00)
GFR, Estimated: >60 mL/min (ref >60)
Glucose, Bld: 141 mg/dL — ABNORMAL HIGH (ref 70–99)
Potassium: 4.3 mmol/L (ref 3.5–5.1)
Sodium: 137 mmol/L (ref 135–145)

## 2021-03-17 MED ORDER — ADULT MULTIVITAMIN W/MINERALS CH: 1.0000 | ORAL_TABLET | Freq: Every day | ORAL | Status: AC

## 2021-03-17 MED ORDER — ONDANSETRON HCL 4 MG PO TABS
4.0000 mg | ORAL_TABLET | Freq: Four times a day (QID) | ORAL | Status: DC | PRN
  Administered 2021-03-18: 23:00:00 4 mg via ORAL
  Filled 2021-03-17: qty 1

## 2021-03-17 MED ORDER — APIXABAN 5 MG PO TABS
5.0000 mg | ORAL_TABLET | Freq: Two times a day (BID) | ORAL | Status: DC
  Administered 2021-03-17 – 2021-04-04 (×36): 5 mg via ORAL
  Filled 2021-03-17 (×36): qty 1

## 2021-03-17 MED ORDER — DORZOLAMIDE HCL-TIMOLOL MAL 2-0.5 % OP SOLN
1.0000 [drp] | Freq: Two times a day (BID) | OPHTHALMIC | Status: DC
  Administered 2021-03-17 – 2021-04-04 (×36): 1 [drp] via OPHTHALMIC

## 2021-03-17 MED ORDER — METFORMIN HCL ER 500 MG PO TB24
1000.0000 mg | ORAL_TABLET | Freq: Two times a day (BID) | ORAL | Status: DC
Start: 2021-03-18 — End: 2021-03-19
  Administered 2021-03-18 – 2021-03-19 (×3): 1000 mg via ORAL
  Filled 2021-03-17 (×3): qty 2

## 2021-03-17 MED ORDER — ADULT MULTIVITAMIN W/MINERALS CH
1.0000 | ORAL_TABLET | Freq: Every day | ORAL | Status: DC
  Administered 2021-03-18 – 2021-04-04 (×18): 1 via ORAL
  Filled 2021-03-17 (×18): qty 1

## 2021-03-17 MED ORDER — GABAPENTIN 300 MG PO CAPS
300.0000 mg | ORAL_CAPSULE | Freq: Every day | ORAL | Status: DC
  Administered 2021-03-17 – 2021-04-03 (×18): 300 mg via ORAL
  Filled 2021-03-17 (×18): qty 1

## 2021-03-17 MED ORDER — INSULIN DETEMIR 100 UNIT/ML ~~LOC~~ SOLN
15.0000 [IU] | Freq: Every day | SUBCUTANEOUS | Status: DC
Start: 2021-03-17 — End: 2021-03-21
  Administered 2021-03-17 – 2021-03-20 (×4): 15 [IU] via SUBCUTANEOUS
  Filled 2021-03-17 (×5): qty 0.15

## 2021-03-17 MED ORDER — BRIMONIDINE TARTRATE 0.2 % OP SOLN: 1.0000 [drp] | Freq: Three times a day (TID) | OPHTHALMIC | Status: DC

## 2021-03-17 MED ORDER — INSULIN ASPART 100 UNIT/ML IJ SOLN
0.0000 [IU] | Freq: Three times a day (TID) | INTRAMUSCULAR | Status: DC
Start: 2021-03-17 — End: 2021-03-20
  Administered 2021-03-17: 2 [IU] via SUBCUTANEOUS
  Administered 2021-03-18: 12:00:00 3 [IU] via SUBCUTANEOUS
  Administered 2021-03-18 – 2021-03-19 (×2): 2 [IU] via SUBCUTANEOUS

## 2021-03-17 MED ORDER — LINAGLIPTIN 5 MG PO TABS
5.0000 mg | ORAL_TABLET | Freq: Every day | ORAL | Status: DC
  Administered 2021-03-18 – 2021-04-04 (×18): 5 mg via ORAL
  Filled 2021-03-17 (×18): qty 1

## 2021-03-17 MED ORDER — ACETAMINOPHEN 325 MG PO TABS
650.0000 mg | ORAL_TABLET | Freq: Four times a day (QID) | ORAL | Status: DC | PRN
  Administered 2021-03-18 – 2021-03-28 (×7): 650 mg via ORAL
  Filled 2021-03-17 (×8): qty 2

## 2021-03-17 MED ORDER — ACETAMINOPHEN 650 MG RE SUPP: 650.0000 mg | Freq: Four times a day (QID) | RECTAL | Status: DC | PRN

## 2021-03-17 MED ORDER — ONDANSETRON HCL 4 MG/2ML IJ SOLN: 4.0000 mg | Freq: Four times a day (QID) | INTRAMUSCULAR | Status: DC | PRN

## 2021-03-17 MED ORDER — INSULIN DETEMIR 100 UNIT/ML ~~LOC~~ SOLN
18.0000 [IU] | Freq: Every day | SUBCUTANEOUS | Status: DC
  Filled 2021-03-17: qty 0.18

## 2021-03-17 MED ORDER — AMLODIPINE BESYLATE 2.5 MG PO TABS
2.5000 mg | ORAL_TABLET | Freq: Two times a day (BID) | ORAL | Status: DC
  Administered 2021-03-17 – 2021-03-26 (×18): 2.5 mg via ORAL
  Filled 2021-03-17 (×18): qty 1

## 2021-03-17 NOTE — Discharge Summary (Signed)
Physician Discharge Summary  Donie Moulton JJH:[id number] DOB: Nov 19, 1970 DOA: 03/06/2021  PCP: Jose Persia, MD  Admit date: 03/06/2021 Discharge date: 03/17/2021  Admitted From: home  Disposition:  CIR   Recommendations for Outpatient Follow-up:  F/u on diarrhea   Discharge Condition:  stable   CODE STATUS:  Full code   Diet recommendation:  heart healthy Consultations: GI  Procedures/Studies: none   Discharge Diagnoses:  Principal Problem:   Acute proctitis  - Acute lower GI bleeding Active Problems:   Type II diabetes mellitus (HCC)   Atrial fibrillation (HCC)   Blind left eye   CKD (chronic kidney disease) stage 3, GFR 30-59 ml/min (HCC)   Glaucoma   Acute urinary retention   At risk for medication noncompliance     The patient is a 50 year old Arabic female with a past medical history significant for but not limited to atrial fibrillation on anticoagulation with Eliquis, history of left eye visual impairment, hypertension, hyperlipidemia as well as diabetes mellitus type 2 who was recently admitted to [facility] from 02/25/2021 until 03/05/2021 for multifocal CVAs who now presents to the hospital for rectal bleeding.  She states that she is okay when she left the hospital the day before yesterday but when she got home her stomach was hurting and she complained about this in the hospital and told that it might be for medications.  He states that it started around 2:30 PM and she states that she started having 3-4 stools overnight and noticed blood in the stool by the end she had frank blood clots last time.  She was having left flank pain and her last dose of Eliquis was yesterday morning and did not take any medications last night.  Upon arrival to the ED she had 4 bloody stools and has a Foley catheter for retention.  She was heme positive and GI was consulted.  CT of the abdomen pelvis showed concern for colitis and proctitis so she was started on IV Rocephin  and Flagyl.  Rectal bleeding improved.  GI evaluated and recommending placing this patient on soft diet which she is tolerating well.   She is medically stable to D/C to CIR but currently awaiting Acceptance. We have resumed her Anticoagulation 03/12/21 and will monitor for bleeding. Has had no further bleeding    Assessment and Plan Proctitis/colitis with GI bleed - resolved - Apixaban was held x 5 days and has been resumed- no recurrence of bleeding noted - she completed her course of Cefdinir and Flagyl on 11/24    Diarrhea - has been having loose stools for a couple of days  - no significant abdominal tenderness on exam - have started slow IVF to prevent dehydration - WBC 7.9 > 8.3 - no fevers - diarrhea is non- bloody and she has normal oral intake w/o nausea - ? If secondary to antibiotics- may need to continue PRN Lomotil- CIR feels that they can manage this issue at their facility and so I will not let this hold up her dc to CIR today.   Paroxysmal Atrial Fibrillation -She was previously on metoprolol but currently on no rate controlling agent - cont Eliquis   Uncontrolled Diabetes mellitus type 2 with hyperglycemia -Continue sliding scale insulin and Levemir Hemoglobin A1C    Component Value Date/Time   HGBA1C 12.1 (H) 02/03/2021 2212   HGBA1C >14.0 (A) 02/08/2020 1630   Urinary retention - resolved  UTI with "yeast" - treated with Diflucan   Essential hypertension Continue amlodipine  Discharge Exam: Vitals:   03/17/21 0507 03/17/21 1136  BP: 129/65 125/61  Pulse: 77 81  Resp: 18 16  Temp: 98.3 F (36.8 C) 98.8 F (37.1 C)  SpO2: 100% 99%   Vitals:   03/16/21 1328 03/16/21 2203 03/17/21 0507 03/17/21 1136  BP: (!) 118/54 140/66 129/65 125/61  Pulse: 78 86 77 81  Resp: _0 Temp: 99.1 F (37.3 C) 98.9 F (37.2 C) 98.3 F (36.8 C) 98.8 F (37.1 C)  TempSrc: Oral Oral Oral Oral  SpO2: 100% 100% 100% 99%  Weight:      Height:         General: Pt is alert, awake, not in acute distress Cardiovascular: RRR, S1/S2 +, no rubs, no gallops Respiratory: CTA bilaterally, no wheezing, no rhonchi Abdominal: Soft, NT, ND, bowel sounds + Extremities: no edema, no cyanosis   Discharge Instructions  Discharge Instructions     Diet - low sodium heart healthy   Complete by: As directed    Increase activity slowly   Complete by: As directed       Allergies as of 03/17/2021   No Known Allergies      Medication List     TAKE these medications    Accu-Chek FastClix Lancets Misc Check blood sugar 4 times a day   Accu-Chek Guide test strip Generic drug: glucose blood CHECK BLOOD SUGAR 4 TIMES PER DAY   Accu-Chek Guide w/Device Kit 1 each by Does not apply route 4 (four) times daily.   amLODipine 2.5 MG tablet Commonly known as: NORVASC TAKE 1 TABLET BY MOUTH TWICE A DAY   apixaban 5 MG Tabs tablet Commonly known as: ELIQUIS Take 1 tablet (5 mg total) by mouth 2 (two) times daily.   B-D UF III MINI PEN NEEDLES 31G X 5 MM Misc Generic drug: Insulin Pen Needle Use pen needle daily for injections   Pentips 32G X 4 MM Misc Generic drug: Insulin Pen Needle Use as directed with insulin pen   brimonidine 0.2 % ophthalmic solution Commonly known as: ALPHAGAN Place 1 drop into the left eye 3 (three) times daily.   calcium citrate-vitamin D 315-200 MG-UNIT tablet Commonly known as: CITRACAL+D Take 2 tablets by mouth 2 (two) times daily.   diclofenac Sodium 1 % Gel Commonly known as: VOLTAREN Apply 2 g topically 4 (four) times daily as needed (foot pain).   dorzolamide-timolol 22.3-6.8 MG/ML ophthalmic solution Commonly known as: COSOPT Place 1 drop into the left eye 2 (two) times daily.   feeding supplement Liqd Take 237 mLs by mouth 2 (two) times daily between meals.   fluconazole 200 MG tablet Commonly known as: DIFLUCAN Take 1 tablet (200 mg total) by mouth daily for 12 days.   gabapentin 300  MG capsule Commonly known as: Neurontin Take 1 capsule (300 mg total) by mouth at bedtime.   insulin aspart 100 UNIT/ML FlexPen Commonly known as: NOVOLOG Inject 3 Units into the skin 3 (three) times daily with meals.   insulin detemir 100 UNIT/ML FlexPen Commonly known as: LEVEMIR Inject 15 Units into the skin at bedtime   linagliptin 5 MG Tabs tablet Commonly known as: TRADJENTA Take 1 tablet (5 mg total) by mouth daily.   metFORMIN 500 MG 24 hr tablet Commonly known as: GLUCOPHAGE-XR TAKE 2 TABLETS (1,000 MG TOTAL) BY MOUTH IN THE MORNING AND AT BEDTIME.   multivitamin with minerals Tabs tablet Take 1 tablet by mouth daily. Start taking on: March 18, 2021  No Known Allergies    CT HEAD WO CONTRAST (5MM)  Result Date: 02/27/2021 CLINICAL DATA:  Headache.  Hyperglycemia. EXAM: CT HEAD WITHOUT CONTRAST TECHNIQUE: Contiguous axial images were obtained from the base of the skull through the vertex without intravenous contrast. COMPARISON:  Head CT and MRI 02/25/2021 FINDINGS: Brain: A 2 cm mixed density/hemorrhagic lesion superiorly in the left occipital lobe has not significantly changed in size although mild surrounding edema appears slightly decreased. No new intracranial hemorrhage, new region of edema, acute cortically based infarct, midline shift, or extra-axial fluid collection is identified. Hypodensities elsewhere in the cerebral white matter bilaterally are unchanged and nonspecific but compatible with mild chronic small vessel ischemic disease. Age advanced cerebral atrophy is again noted. Vascular: No hyperdense vessel. Skull: No fracture or suspicious osseous lesion. Sinuses/Orbits: Visualized paranasal sinuses and mastoid air cells are clear. Visualized orbits are unremarkable. Other: None. IMPRESSION: 1. Unchanged size of left occipital lobe lesion with slightly decreased surrounding edema. 2. No evidence of new intracranial abnormality. Electronically Signed    By: Logan Bores M.D.   On: 02/27/2021 14:05   CT HEAD WO CONTRAST (5MM)  Result Date: 02/25/2021 CLINICAL DATA:  Headache EXAM: CT HEAD WITHOUT CONTRAST TECHNIQUE: Contiguous axial images were obtained from the base of the skull through the vertex without intravenous contrast. COMPARISON:  CT head 01/29/2021 FINDINGS: Brain: Interval development of 2 cm hypodensity in the posterior parietal lobe on the left. Possible small area of associated hemorrhage. Low-density area appears involve cortex and white matter. No other areas of new low-density identified.  Mild atrophy. Vascular: Negative for hyperdense vessel Skull: Negative Sinuses/Orbits: Negative Other: None IMPRESSION: New hypodensity left posterior parietal lobe, not present 1 month ago. Possible small area of adjacent hemorrhage. Differential includes hemorrhagic infarct versus mass lesion. Recommend MRI brain without with contrast for further evaluation. These results were called by telephone at the time of interpretation on 02/25/2021 at 3:12 pm to provider MARGAUX VENTER , who verbally acknowledged these results. Electronically Signed   By: Franchot Gallo M.D.   On: 02/25/2021 15:12   CT Cervical Spine Wo Contrast  Result Date: 02/25/2021 CLINICAL DATA:  Metastatic disease evaluation EXAM: CT CERVICAL SPINE WITHOUT CONTRAST TECHNIQUE: Multidetector CT imaging of the cervical spine was performed without intravenous contrast. Multiplanar CT image reconstructions were also generated. COMPARISON:  None. FINDINGS: Alignment: No static subluxation. Facets are aligned. Occipital condyles and the lateral masses of C1 and C2 are normally approximated. Skull base and vertebrae: No acute fracture. Soft tissues and spinal canal: No prevertebral fluid or swelling. No visible canal hematoma. Disc levels: No advanced spinal canal or neural foraminal stenosis. Upper chest: No pneumothorax, pulmonary nodule or pleural effusion. Other: Normal visualized paraspinal  cervical soft tissues. IMPRESSION: No acute fracture or static subluxation of the cervical spine. No osseous metastatic disease. Electronically Signed   By: Ulyses Jarred M.D.   On: 02/25/2021 19:32   MR ANGIO HEAD WO CONTRAST  Result Date: 03/01/2021 CLINICAL DATA:  Headache and vomiting. Intracranial lesions suspicious for malignancy or inflammatory disease. EXAM: MRA HEAD WITHOUT CONTRAST TECHNIQUE: Angiographic images of the Circle of Willis were acquired using MRA technique without intravenous contrast. COMPARISON:  No pertinent prior exam. FINDINGS: Anterior circulation: Smooth and widely patent carotid arteries. No branch occlusion. The distal right A1 segment appears ectatic but no generalized or definite beading. Mild irregularity of MCA branches, especially on the right, although likely accentuated by motion artifact. Posterior circulation: The vertebral and basilar arteries  are smoothly contoured and widely patent. No branch occlusion, beading, or aneurysm. Mild irregularity of PCA branches. Anatomic variants: None significant IMPRESSION: Medium size vessel irregularity with medical history suggesting premature atherosclerosis. There is 1 area of possible segmental dilatation such that an inflammatory vasculopathy is also considered. Electronically Signed   By: Jorje Guild M.D.   On: 03/01/2021 07:20   MR BRAIN W WO CONTRAST  Result Date: 02/28/2021 CLINICAL DATA:  Brain/CNS neoplasm, assess treatment response. EXAM: MRI HEAD WITHOUT AND WITH CONTRAST TECHNIQUE: Multiplanar, multiecho pulse sequences of the brain and surrounding structures were obtained without and with intravenous contrast. CONTRAST:  6.36m GADAVIST GADOBUTROL 1 MMOL/ML IV SOLN COMPARISON:  None. FINDINGS: Brain: Multiple punctate foci of restricted diffusion are seen within the bilateral cerebral hemispheres and a single focus is seen in the right cerebellar hemisphere consistent with acute infarcts. Diffuse FLAIR  hyperintensity throughout the sulci in the bilateral cerebral convexity without corresponding T1 hyperintensity, susceptibility artifact or contrast enhancement, may represent hyperacute clean versus proteinaceous material. Ventricular size remains stable. No significant interval change of the 2 enhancing lesions in the left occipital measuring approximately 21 x 13 mm and 7 mm. No new enhancing lesion identified. Vascular: Normal flow voids. Skull and upper cervical spine: Normal marrow signal. Sinuses/Orbits: Right lens surgery.  Paranasal sinuses are clear. Other: None. IMPRESSION: 1. Prominent diffuse FLAIR hyperintensity within the cerebral sulci may represent proteinaceous material related to meningitis or, less likely, subarachnoid hemorrhage. 2. Multiple punctate acute infarcts in the bilateral cerebral hemispheres and right cerebellar hemisphere. 3. Unchanged appearance of 2 enhancing lesions in the left occipital lobe, may represent subacute infarct with hemorrhagic transformation versus malignancy. These results were called by telephone at the time of interpretation on 02/28/2021 at 9:39 am to provider [facility], who verbally acknowledged these results. Electronically Signed   By: KPedro EarlsM.D.   On: 02/28/2021 09:40   MR BRAIN W WO CONTRAST  Result Date: 02/25/2021 CLINICAL DATA:  Brain mass or lesion. EXAM: MRI HEAD WITHOUT AND WITH CONTRAST TECHNIQUE: Multiplanar, multiecho pulse sequences of the brain and surrounding structures were obtained without and with intravenous contrast. CONTRAST:  775mGADAVIST GADOBUTROL 1 MMOL/ML IV SOLN COMPARISON:  Head CT 02/25/2021 and MRI 11/07/2020 FINDINGS: Brain: A heterogeneously enhancing lesion posteriorly in the superior portion of the left occipital lobe measures 21 x 13 mm with mild surrounding edema. Associated susceptibility artifact is compatible with old blood products. A separate, smaller adjacent enhancing lesion more  inferiorly in the left occipital lobe measures 7 mm. Small T2 hyperintensities elsewhere in the cerebral white matter bilaterally are unchanged from the prior MRI and are nonspecific but compatible with mild chronic small vessel ischemic disease. Cerebral atrophy is moderately advanced for age. Vascular: Major intracranial vascular flow voids are preserved. Skull and upper cervical spine: Unremarkable bone marrow signal. Sinuses/Orbits: Right cataract extraction.  Clear paranasal sinuses. Other: None. IMPRESSION: 1. Two adjacent enhancing lesions in the left occipital lobe with mild edema, indeterminate but suspicious for metastases. 2. Mild chronic small vessel ischemic disease and moderate cerebral atrophy. Electronically Signed   By: AlLogan Bores.D.   On: 02/25/2021 18:05   MR CERVICAL SPINE WO CONTRAST  Result Date: 03/01/2021 CLINICAL DATA:  Low back pain with cauda equina suspected EXAM: MRI CERVICAL, THORACIC AND LUMBAR SPINE WITHOUT CONTRAST TECHNIQUE: Multiplanar and multiecho pulse sequences of the cervical spine, to include the craniocervical junction and cervicothoracic junction, and thoracic and lumbar spine, were  obtained without intravenous contrast. COMPARISON:  None. FINDINGS: MRI CERVICAL SPINE FINDINGS Alignment: Physiologic. Vertebrae: No fracture, evidence of discitis, or bone lesion. Cord: Normal signal and morphology. Posterior Fossa, vertebral arteries, paraspinal tissues: Empty left proximal transverse foramina due to late entry of the left vertebral artery. Disc levels: Small disc protrusions at C3-4 to C5-6. Negative facets. No neural impingement MRI THORACIC SPINE FINDINGS Alignment:  Normal alignment Vertebrae: No fracture, evidence of discitis, or bone lesion. Cord:  Normal signal and morphology. Paraspinal and other soft tissues: Heterogeneous of the bilateral kidneys with striated nephrogram appearance Disc levels: Diffusely preserved disc height and hydration. Negative facets.  No neural impingement MRI LUMBAR SPINE FINDINGS Segmentation:  5 lumbar type vertebrae Alignment:  Physiologic. Vertebrae:  No fracture, evidence of discitis, or bone lesion. Conus medullaris and cauda equina: Conus extends to the L1-2 level. Conus and cauda equina appear normal. Paraspinal and other soft tissues: Full bladder without detected wall thickening. Prominent thickness of the rectum but negative by preceding CT. Disc levels: Diffusely preserved disc height and hydration. Up to mild ventral spondylitic spurring. Left foraminal herniation up lifting the left L4 nerve root. IMPRESSION: 1. Normal MRI of the cord. 2. Heterogeneity of the bilateral kidneys often seen with pyelonephritis or nephritis. 3. L4-5 left foraminal protrusion contacting the L4 nerve root. Electronically Signed   By: Jorje Guild M.D.   On: 03/01/2021 07:26   MR THORACIC SPINE WO CONTRAST  Result Date: 03/01/2021 CLINICAL DATA:  Low back pain with cauda equina suspected EXAM: MRI CERVICAL, THORACIC AND LUMBAR SPINE WITHOUT CONTRAST TECHNIQUE: Multiplanar and multiecho pulse sequences of the cervical spine, to include the craniocervical junction and cervicothoracic junction, and thoracic and lumbar spine, were obtained without intravenous contrast. COMPARISON:  None. FINDINGS: MRI CERVICAL SPINE FINDINGS Alignment: Physiologic. Vertebrae: No fracture, evidence of discitis, or bone lesion. Cord: Normal signal and morphology. Posterior Fossa, vertebral arteries, paraspinal tissues: Empty left proximal transverse foramina due to late entry of the left vertebral artery. Disc levels: Small disc protrusions at C3-4 to C5-6. Negative facets. No neural impingement MRI THORACIC SPINE FINDINGS Alignment:  Normal alignment Vertebrae: No fracture, evidence of discitis, or bone lesion. Cord:  Normal signal and morphology. Paraspinal and other soft tissues: Heterogeneous of the bilateral kidneys with striated nephrogram appearance Disc levels:  Diffusely preserved disc height and hydration. Negative facets. No neural impingement MRI LUMBAR SPINE FINDINGS Segmentation:  5 lumbar type vertebrae Alignment:  Physiologic. Vertebrae:  No fracture, evidence of discitis, or bone lesion. Conus medullaris and cauda equina: Conus extends to the L1-2 level. Conus and cauda equina appear normal. Paraspinal and other soft tissues: Full bladder without detected wall thickening. Prominent thickness of the rectum but negative by preceding CT. Disc levels: Diffusely preserved disc height and hydration. Up to mild ventral spondylitic spurring. Left foraminal herniation up lifting the left L4 nerve root. IMPRESSION: 1. Normal MRI of the cord. 2. Heterogeneity of the bilateral kidneys often seen with pyelonephritis or nephritis. 3. L4-5 left foraminal protrusion contacting the L4 nerve root. Electronically Signed   By: Jorje Guild M.D.   On: 03/01/2021 07:26   MR LUMBAR SPINE WO CONTRAST  Result Date: 03/01/2021 CLINICAL DATA:  Low back pain with cauda equina suspected EXAM: MRI CERVICAL, THORACIC AND LUMBAR SPINE WITHOUT CONTRAST TECHNIQUE: Multiplanar and multiecho pulse sequences of the cervical spine, to include the craniocervical junction and cervicothoracic junction, and thoracic and lumbar spine, were obtained without intravenous contrast. COMPARISON:  None. FINDINGS: MRI CERVICAL  SPINE FINDINGS Alignment: Physiologic. Vertebrae: No fracture, evidence of discitis, or bone lesion. Cord: Normal signal and morphology. Posterior Fossa, vertebral arteries, paraspinal tissues: Empty left proximal transverse foramina due to late entry of the left vertebral artery. Disc levels: Small disc protrusions at C3-4 to C5-6. Negative facets. No neural impingement MRI THORACIC SPINE FINDINGS Alignment:  Normal alignment Vertebrae: No fracture, evidence of discitis, or bone lesion. Cord:  Normal signal and morphology. Paraspinal and other soft tissues: Heterogeneous of the  bilateral kidneys with striated nephrogram appearance Disc levels: Diffusely preserved disc height and hydration. Negative facets. No neural impingement MRI LUMBAR SPINE FINDINGS Segmentation:  5 lumbar type vertebrae Alignment:  Physiologic. Vertebrae:  No fracture, evidence of discitis, or bone lesion. Conus medullaris and cauda equina: Conus extends to the L1-2 level. Conus and cauda equina appear normal. Paraspinal and other soft tissues: Full bladder without detected wall thickening. Prominent thickness of the rectum but negative by preceding CT. Disc levels: Diffusely preserved disc height and hydration. Up to mild ventral spondylitic spurring. Left foraminal herniation up lifting the left L4 nerve root. IMPRESSION: 1. Normal MRI of the cord. 2. Heterogeneity of the bilateral kidneys often seen with pyelonephritis or nephritis. 3. L4-5 left foraminal protrusion contacting the L4 nerve root. Electronically Signed   By: Jorje Guild M.D.   On: 03/01/2021 07:26   CT ABDOMEN PELVIS W CONTRAST  Result Date: 03/06/2021 CLINICAL DATA:  Left lower quadrant pain.  Blood in stool. EXAM: CT ABDOMEN AND PELVIS WITH CONTRAST TECHNIQUE: Multidetector CT imaging of the abdomen and pelvis was performed using the standard protocol following bolus administration of intravenous contrast. CONTRAST:  51m OMNIPAQUE IOHEXOL 350 MG/ML SOLN COMPARISON:  02/25/2021 FINDINGS: Lower chest: Unremarkable Hepatobiliary: No suspicious focal abnormality within the liver parenchyma. There is no evidence for gallstones, gallbladder wall thickening, or pericholecystic fluid. No intrahepatic or extrahepatic biliary dilation. Pancreas: No focal mass lesion. No dilatation of the main duct. No intraparenchymal cyst. No peripancreatic edema. Spleen: No splenomegaly. No focal mass lesion. Adrenals/Urinary Tract: No adrenal nodule or mass. Kidneys unremarkable. No evidence for hydroureter. Foley catheter decompresses the urinary bladder. Gas  in the bladder lumen is compatible with the instrumentation. Stomach/Bowel: Stomach is unremarkable. No gastric wall thickening. No evidence of outlet obstruction. Duodenum is normally positioned as is the ligament of Treitz. No small bowel wall thickening. No small bowel dilatation. The terminal ileum is normal. The appendix is normal. There is mild wall thickening in the descending colon likely related to underdistention. Mild nonspecific wall thickening in the sigmoid colon with more prominent edematous wall thickening visible in the rectum (image 83/2) with perirectal and presacral edema. Vascular/Lymphatic: No abdominal aortic aneurysm. There is no gastrohepatic or hepatoduodenal ligament lymphadenopathy. No retroperitoneal or mesenteric lymphadenopathy. No pelvic sidewall lymphadenopathy. Reproductive: The uterus is unremarkable.  There is no adnexal mass. Other: No intraperitoneal free fluid. Musculoskeletal: No worrisome lytic or sclerotic osseous abnormality. IMPRESSION: 1. Prominent edematous wall thickening in the rectum with perirectal and presacral edema. Imaging features suggest infectious/inflammatory proctitis. There is probably some associated wall thickening in the site distal sigmoid colon in features may reflect a left-sided infectious/inflammatory colitis 2. Foley catheter decompresses the urinary bladder. Gas in the bladder lumen is compatible with the instrumentation. Electronically Signed   By: EMisty StanleyM.D.   On: 03/06/2021 09:32   [place]NAL  Result Date: 02/28/2021 CLINICAL DATA:  Acute kidney injury EXAM: RENAL / URINARY TRACT ULTRASOUND COMPLETE COMPARISON:  CT 02/03/2021 FINDINGS: Right  Kidney: Renal measurements: 12.2 x 4.6 x 4.2 cm = volume: 123 mL. Increased echogenicity. No hydronephrosis or focal lesion. Left Kidney: Renal measurements: 11.4 x 5.9 by 4.9 cm = volume: 174 mL. Increased echogenicity. No hydronephrosis or focal lesion. Bladder: Appears normal for degree of  bladder distention. Other: None. IMPRESSION: Increased echogenicity of the renal parenchyma with normal size, consistent with acute kidney injury or nephritis. No obstruction or focal lesion. Electronically Signed   By: Nelson Chimes M.D.   On: 02/28/2021 19:40   CT CHEST ABDOMEN PELVIS W CONTRAST  Result Date: 02/25/2021 CLINICAL DATA:  Intracranial mass.  Search for metastatic disease EXAM: CT CHEST, ABDOMEN, AND PELVIS WITH CONTRAST TECHNIQUE: Multidetector CT imaging of the chest, abdomen and pelvis was performed following the standard protocol during bolus administration of intravenous contrast. CONTRAST:  62m OMNIPAQUE IOHEXOL 350 MG/ML SOLN COMPARISON:  None. FINDINGS: CT CHEST FINDINGS Cardiovascular: No significant vascular findings. Normal heart size. No pericardial effusion. Mediastinum/Nodes: No enlarged mediastinal, hilar, or axillary lymph nodes. Thyroid gland, trachea, and esophagus demonstrate no significant findings. Lungs/Pleura: Lungs are clear. No pleural effusion or pneumothorax. Musculoskeletal: No chest wall mass or suspicious bone lesions identified. CT ABDOMEN PELVIS FINDINGS Hepatobiliary: No focal liver abnormality is seen. No gallstones, gallbladder wall thickening, or biliary dilatation. Pancreas: Unremarkable. No pancreatic ductal dilatation or surrounding inflammatory changes. Spleen: No splenic injury or perisplenic hematoma. Adrenals/Urinary Tract: Adrenal glands are unremarkable. Kidneys are normal, without renal calculi, focal lesion, or hydronephrosis. Bladder is unremarkable. Stomach/Bowel: Stomach is within normal limits. Appendix appears normal. No evidence of bowel wall thickening, distention, or inflammatory changes. Vascular/Lymphatic: No significant vascular findings are present. No enlarged abdominal or pelvic lymph nodes. Reproductive: Uterus and bilateral adnexa are unremarkable. Other: No abdominal wall hernia or abnormality. No abdominopelvic ascites.  Musculoskeletal: No acute or significant osseous findings. IMPRESSION: No evidence of malignancy in the chest, abdomen or pelvis. Mammography would be necessary to fully assess the breast tissue. Electronically Signed   By: KUlyses JarredM.D.   On: 02/25/2021 19:36   DG CHEST PORT 1 VIEW  Result Date: 02/27/2021 CLINICAL DATA:  Headache, hyperglycemia, sleepy state EXAM: PORTABLE CHEST 1 VIEW COMPARISON:  Portable exam 1225 hours compared to 02/03/2021 FINDINGS: Enlargement of cardiac silhouette. Mediastinal contours and pulmonary vascularity normal. Lungs clear. No infiltrate, pleural effusion, or pneumothorax. 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 IMPRESSION: Enlargement of cardiac silhouette without acute infiltrate. Electronically Signed   By: MLavonia DanaM.D.   On: 02/27/2021 14:19   EEG adult  Result Date: 03/01/2021 YLora Havens MD     03/01/2021  1:14 PM Patient Name: SJaylan DuggarMRN: [id number] Attending: PLora HavensReferring Physician/Provider: Dr SLesleigh NoeDate: 03/01/2021 Duration: 23.11 mins Patient history: 50year old female with altered mental status and tremors /asterixis.  EEG to evaluate for seizures. Level of alertness: Awake AEDs during EEG study: None Technical aspects: This EEG study was done with scalp electrodes positioned according to the 10-20 International system of electrode placement. Electrical activity was acquired at a sampling rate of _0  and reviewed with a high frequency filter of _1  and a low frequency filter of _2 . EEG data were recorded continuously and digitally stored. Description: The posterior dominant rhythm consists of 8-_3  activity of moderate voltage (25-35 uV) seen predominantly in posterior head regions, symmetric and reactive to eye opening and eye closing. EEG showed intermittent 3 to 6 Hz theta-delta slowing. Hyperventilation and photic stimulation were not performed.  Of note, study was  technically difficult due to significant electrode and movement artifact. ABNORMALITY - Intermittent slow, generalized IMPRESSION: This technically difficult study is suggestive of mild diffuse encephalopathy, nonspecific etiology. No seizures or epileptiform discharges were seen throughout the recording. Priyanka O Yadav   VAS US CAROTID  Result Date: 03/05/2021 Carotid Arterial Duplex Study Patient Name:  MARYCRUZ BOEHNER  Date of Exam:   03/03/2021 Medical Rec #: [id number]       Accession #:    [id number] Date of Birth: 08-11-1970        Patient Gender: F Patient Age:   86 years Exam Location:  [facility] Procedure:      VAS US CAROTID Referring Phys: Dorian Pod --------------------------------------------------------------------------------  Indications:       CVA. Risk Factors:      Hypertension, hyperlipidemia, Diabetes. Comparison Study:  No prior study Performing Technologist: Maudry Mayhew MHA, RDMS, RVT, RDCS  Examination Guidelines: A complete evaluation includes B-mode imaging, spectral Doppler, color Doppler, and power Doppler as needed of all accessible portions of each vessel. Bilateral testing is considered an integral part of a complete examination. Limited examinations for reoccurring indications may be performed as noted.  Right Carotid Findings: +----------+--------+--------+--------+------------------+--------+           PSV cm/sEDV cm/sStenosisPlaque DescriptionComments +----------+--------+--------+--------+------------------+--------+ CCA Prox  76      13                                         +----------+--------+--------+--------+------------------+--------+ CCA Distal92      21                                         +----------+--------+--------+--------+------------------+--------+ ICA Prox  70      16                                         +----------+--------+--------+--------+------------------+--------+ ICA Distal92      32                                          +----------+--------+--------+--------+------------------+--------+ ECA       72                                                 +----------+--------+--------+--------+------------------+--------+ +----------+--------+-------+----------------+-------------------+           PSV cm/sEDV cmsDescribe        Arm Pressure (mmHG) +----------+--------+-------+----------------+-------------------+ Subclavian127            Multiphasic, WNL                    +----------+--------+-------+----------------+-------------------+ +---------+--------+--+--------+--+---------+ VertebralPSV cm/s75EDV cm/s22Antegrade +---------+--------+--+--------+--+---------+  Left Carotid Findings: +----------+--------+--------+--------+-----------------------+--------+           PSV cm/sEDV cm/sStenosisPlaque Description     Comments +----------+--------+--------+--------+-----------------------+--------+ CCA Prox  84      14                                              +----------+--------+--------+--------+-----------------------+--------+  CCA Distal90      22              smooth and heterogenous         +----------+--------+--------+--------+-----------------------+--------+ ICA Prox  84      22                                              +----------+--------+--------+--------+-----------------------+--------+ ICA Distal66      21                                              +----------+--------+--------+--------+-----------------------+--------+ ECA       101     11                                              +----------+--------+--------+--------+-----------------------+--------+ +----------+--------+--------+----------------+-------------------+           PSV cm/sEDV cm/sDescribe        Arm Pressure (mmHG) +----------+--------+--------+----------------+-------------------+ [id number]             Multiphasic, WNL                     +----------+--------+--------+----------------+-------------------+ +---------+--------+--+--------+--+---------+ VertebralPSV cm/s34EDV cm/s11Antegrade +---------+--------+--+--------+--+---------+   Summary: Right Carotid: Velocities in the right ICA are consistent with a 1-39% stenosis. Left Carotid: Velocities in the left ICA are consistent with a 1-39% stenosis. Vertebrals:  Bilateral vertebral arteries demonstrate antegrade flow. Subclavians: Normal flow hemodynamics were seen in bilateral subclavian              arteries. *See table(s) above for measurements and observations.  Electronically signed by Antony Contras MD on 03/05/2021 at 8:32:39 AM.    Final      The results of significant diagnostics from this hospitalization (including imaging, microbiology, ancillary and laboratory) are listed below for reference.     Microbiology: Recent Results (from the past 240 hour(s))  Resp Panel by RT-PCR (Flu A&B, Covid) Nasopharyngeal Swab     Status: None   Collection Time: 03/09/21  2:56 PM   Specimen: Nasopharyngeal Swab; Nasopharyngeal(NP) swabs in vial transport medium  Result Value Ref Range Status   SARS Coronavirus 2 by RT PCR NEGATIVE NEGATIVE Final    Comment: (NOTE) SARS-CoV-2 target nucleic acids are NOT DETECTED.  The SARS-CoV-2 RNA is generally detectable in upper respiratory specimens during the acute phase of infection. The lowest concentration of SARS-CoV-2 viral copies this assay can detect is 138 copies/mL. A negative result does not preclude SARS-Cov-2 infection and should not be used as the sole basis for treatment or other patient management decisions. A negative result may occur with  improper specimen collection/handling, submission of specimen other than nasopharyngeal swab, presence of viral mutation(s) within the areas targeted by this assay, and inadequate number of viral copies(<138 copies/mL). A negative result must be combined with clinical  observations, patient history, and epidemiological information. The expected result is Negative.  Fact Sheet for Patients:  EntrepreneurPulse.com.au  Fact Sheet for Healthcare Providers:  IncredibleEmployment.be  This test is no t yet approved or cleared by the [place] FDA and  has been authorized for detection and/or diagnosis  of SARS-CoV-2 by FDA under an Emergency Use Authorization (EUA). This EUA will remain  in effect (meaning this test can be used) for the duration of the COVID-19 declaration under Section 564(b)(1) of the Act, 21 U.S.C.section 360bbb-3(b)(1), unless the authorization is terminated  or revoked sooner.       Influenza A by PCR NEGATIVE NEGATIVE Final   Influenza B by PCR NEGATIVE NEGATIVE Final    Comment: (NOTE) The Xpert Xpress SARS-CoV-2/FLU/RSV plus assay is intended as an aid in the diagnosis of influenza from Nasopharyngeal swab specimens and should not be used as a sole basis for treatment. Nasal washings and aspirates are unacceptable for Xpert Xpress SARS-CoV-2/FLU/RSV testing.  Fact Sheet for Patients: EntrepreneurPulse.com.au  Fact Sheet for Healthcare Providers: IncredibleEmployment.be  This test is not yet approved or cleared by the [place] FDA and has been authorized for detection and/or diagnosis of SARS-CoV-2 by FDA under an Emergency Use Authorization (EUA). This EUA will remain in effect (meaning this test can be used) for the duration of the COVID-19 declaration under Section 564(b)(1) of the Act, 21 U.S.C. section 360bbb-3(b)(1), unless the authorization is terminated or revoked.  Performed at [facility], [place] [address] [place], [place] [address]      Labs: BNP (last 3 results) Recent Labs    02/09/21 1137  BNP 37.1   Basic Metabolic Panel: Recent Labs  Lab 03/11/21 0517 03/12/21 0447 03/13/21 0424 03/14/21 0431  03/16/21 0437 03/17/21 1108  NA 140 137 135 140 138 137  K 4.1 4.3 4.4 4.2 4.0 4.3  CL 109 107 106 110 109 107  CO2 _0 GLUCOSE 97 292* 308* 131* 100* 141*  BUN 22* 29* 37* 34* 37* 30*  CREATININE 0.91 1.07* 1.09* 0.91 1.00 0.96  CALCIUM 8.5* 8.5* 8.4* 9.0 8.8* 8.4*  MG 2.0 2.1 2.0 2.0  --   --   PHOS 3.7 3.3 3.2 3.3  --   --    Liver Function Tests: Recent Labs  Lab 03/11/21 0517 03/12/21 0447 03/13/21 0424 03/14/21 0431  AST _1 ALT _2 ALKPHOS 82 100 109 81  BILITOT 0.4 0.2* 0.3 0.2*  PROT 6.0* 5.9* 5.9* 6.1*  ALBUMIN 2.7* 2.6* 2.7* 2.7*   No results for input(s): LIPASE, AMYLASE in the last 168 hours. No results for input(s): AMMONIA in the last 168 hours. CBC: Recent Labs  Lab 03/11/21 0517 03/12/21 0447 03/13/21 0424 03/14/21 0431 03/16/21 0437 03/17/21 1108  WBC 5.6 5.4 4.9 5.3 7.9 8.3  NEUTROABS 2.9 2.4 2.4 2.0  --   --   HGB 8.2* 8.5* 8.5* 8.1* 8.2* 8.7*  HCT 26.5* 28.0* 27.5* 25.7* 27.0* 28.2*  MCV 89.8 90.6 90.5 88.3 91.2 92.5  PLT 203 195 194 185 169 142*   Cardiac Enzymes: No results for input(s): CKTOTAL, CKMB, CKMBINDEX, TROPONINI in the last 168 hours. BNP: Invalid input(s): POCBNP CBG: Recent Labs  Lab 03/16/21 1146 03/16/21 1652 03/16/21 2159 03/17/21 0730 03/17/21 1134  GLUCAP 196* 142* 154* 105* 182*   D-Dimer No results for input(s): DDIMER in the last 72 hours. Hgb A1c No results for input(s): HGBA1C in the last 72 hours. Lipid Profile No results for input(s): CHOL, HDL, LDLCALC, TRIG, CHOLHDL, LDLDIRECT in the last 72 hours. Thyroid function studies No results for input(s): TSH, T4TOTAL, T3FREE, THYROIDAB in the last 72 hours.  Invalid input(s): FREET3 Anemia work up No results for input(s): VITAMINB12,  FOLATE, FERRITIN, TIBC, IRON, RETICCTPCT in the last 72 hours. Urinalysis    Component Value Date/Time   COLORURINE YELLOW 03/01/2021 0831   APPEARANCEUR CLOUDY (A) 03/01/2021 0831    LABSPEC 1.016 03/01/2021 0831   PHURINE 5.0 03/01/2021 0831   GLUCOSEU NEGATIVE 03/01/2021 0831   GLUCOSEU > 1000 mg/dL (A) 12/16/2008 2030   HGBUR SMALL (A) 03/01/2021 0831   HGBUR trace-intact 03/20/2010 1517   BILIRUBINUR NEGATIVE 03/01/2021 0831   BILIRUBINUR Negative 08/09/2020 1552   KETONESUR NEGATIVE 03/01/2021 0831   PROTEINUR 100 (A) 03/01/2021 0831   UROBILINOGEN 0.2 08/09/2020 1552   UROBILINOGEN 0.2 12/05/2019 1753   NITRITE NEGATIVE 03/01/2021 0831   LEUKOCYTESUR LARGE (A) 03/01/2021 0831   Sepsis Labs Invalid input(s): PROCALCITONIN,  WBC,  LACTICIDVEN Microbiology Recent Results (from the past 240 hour(s))  Resp Panel by RT-PCR (Flu A&B, Covid) Nasopharyngeal Swab     Status: None   Collection Time: 03/09/21  2:56 PM   Specimen: Nasopharyngeal Swab; Nasopharyngeal(NP) swabs in vial transport medium  Result Value Ref Range Status   SARS Coronavirus 2 by RT PCR NEGATIVE NEGATIVE Final    Comment: (NOTE) SARS-CoV-2 target nucleic acids are NOT DETECTED.  The SARS-CoV-2 RNA is generally detectable in upper respiratory specimens during the acute phase of infection. The lowest concentration of SARS-CoV-2 viral copies this assay can detect is 138 copies/mL. A negative result does not preclude SARS-Cov-2 infection and should not be used as the sole basis for treatment or other patient management decisions. A negative result may occur with  improper specimen collection/handling, submission of specimen other than nasopharyngeal swab, presence of viral mutation(s) within the areas targeted by this assay, and inadequate number of viral copies(<138 copies/mL). A negative result must be combined with clinical observations, patient history, and epidemiological information. The expected result is Negative.  Fact Sheet for Patients:  EntrepreneurPulse.com.au  Fact Sheet for Healthcare Providers:  IncredibleEmployment.be  This test is no  t yet approved or cleared by the [place] FDA and  has been authorized for detection and/or diagnosis of SARS-CoV-2 by FDA under an Emergency Use Authorization (EUA). This EUA will remain  in effect (meaning this test can be used) for the duration of the COVID-19 declaration under Section 564(b)(1) of the Act, 21 U.S.C.section 360bbb-3(b)(1), unless the authorization is terminated  or revoked sooner.       Influenza A by PCR NEGATIVE NEGATIVE Final   Influenza B by PCR NEGATIVE NEGATIVE Final    Comment: (NOTE) The Xpert Xpress SARS-CoV-2/FLU/RSV plus assay is intended as an aid in the diagnosis of influenza from Nasopharyngeal swab specimens and should not be used as a sole basis for treatment. Nasal washings and aspirates are unacceptable for Xpert Xpress SARS-CoV-2/FLU/RSV testing.  Fact Sheet for Patients: EntrepreneurPulse.com.au  Fact Sheet for Healthcare Providers: IncredibleEmployment.be  This test is not yet approved or cleared by the [place] FDA and has been authorized for detection and/or diagnosis of SARS-CoV-2 by FDA under an Emergency Use Authorization (EUA). This EUA will remain in effect (meaning this test can be used) for the duration of the COVID-19 declaration under Section 564(b)(1) of the Act, 21 U.S.C. section 360bbb-3(b)(1), unless the authorization is terminated or revoked.  Performed at [facility], [place] [address] [place], [place] [address]      Time coordinating discharge in minutes: 26  SIGNED:   Debbe Odea, MD  [facility] 03/17/2021, 1:28 PM

## 2021-03-17 NOTE — Progress Notes (Signed)
Inpatient Rehabilitation Admission Medication Review by a Pharmacist  A complete drug regimen review was completed for this patient to identify any potential clinically significant medication issues.  High Risk Drug Classes Is patient taking? Indication by Medication  Antipsychotic No   Anticoagulant Yes Apixaban for paroxysmal atrial fibrillation  Antibiotic No   Opioid No   Antiplatelet No   Hypoglycemics/insulin Yes Levemir, Novolog, Linagliptin, Metformin for T2DM   Vasoactive Medication Yes Amlodipine for HTN  Chemotherapy No   Other Yes Gabapentin for neuropathy     Type of Medication Issue Identified Description of Issue Recommendation(s)  Drug Interaction(s) (clinically significant)     Duplicate Therapy     Allergy     No Medication Administration End Date     Incorrect Dose  Dose discrepancy - clarification needed for Levemir dose. Patient was receiving Levemir 18 units qHS during acute admission, but discharge summary noted Levemir 15 units qHS. Reached out to Dr. Morton Stall - confirmed correct dose for patient should be Levemir 15 units qHS. Discussed with Dr. Ranell Patrick and adjusted dose accordingly.  Additional Drug Therapy Needed  Discharge summary noted Linagliptin and Metformin, but not ordered in rehab.  Reached out to Dr. Morton Stall - confirmed that patient should be started on linagliptin and metformin in rehab. This was also communicated with Dr. Ranell Patrick; orders entered for Linagliptin and Metformin.   Significant med changes from prior encounter (inform family/care partners about these prior to discharge). Discharge summary from acute admission noted for patient to take fluconazole 200 mg x 12 days; However, per chart review, patient already completed 7 days of treatment with fluconazole for UTI during acute admission.  Reached out to Dr. Morton Stall - confirmed that the patient already received sufficient UTI treatment with fluconazole x 7 days, and does not need additional  treatment. This was also communicated with Dr. Ranell Patrick.   Other       Clinically significant medication issues were identified that warrant physician communication and completion of prescribed/recommended actions by midnight of the next day:  Yes (resolved, MD contacted)  Name of provider notified for urgent issues identified: Dr. Doy Hutching; Dr. Leeroy Cha   Provider Method of Notification: Secure chat     Pharmacist comments:   Discharge summary from acute admission noted for patient to be continued on Levemir 15 units qHS; However, Levemir 18 units qHS was ordered in rehab. Reached out to Dr. Morton Stall - confirmed correct dose for patient should be Levemir 15 units qHS. Discussed with Dr. Ranell Patrick and adjusted dose accordingly in rehab medication list.   Discharge summary from acute admission noted for patient to receive linagliptin and metformin. However, these medications were not ordered/started in rehab. Reached out to Dr. Morton Stall - confirmed that patient should be started on linagliptin and metformin in rehab. This was also communicated with Dr. Ranell Patrick and orders entered for linagliptin and metformin.   Discharge summary from acute admission noted for patient to take fluconazole 200 mg x 12 days; However, per chart review, patient already completed 7 days of treatment with fluconazole for UTI during acute admission. Reached out to Dr. Morton Stall - confirmed that the patient already received sufficient UTI treatment with fluconazole x 7 days, and does not need additional treatment. This was also communicated with Dr. Ranell Patrick.  Time spent performing this drug regimen review (minutes):  [place] 03/17/2021 3:36 PM

## 2021-03-17 NOTE — Progress Notes (Signed)
Pt discharged via [place] transportation in stable condition. Patient understood her transportation over to [facility]. Husband and son present during discharge.

## 2021-03-17 NOTE — H&P (Addendum)
  Physical Medicine and Rehabilitation Admission H&P  CC: Debility  HPI: Jennifer [name] is a 50-year-old right-handed limited English speaking female with complicated medical history of atrial fibrillation maintained on Eliquis, non-STEMI 10/22, left eye visual impairment/glaucoma followed by Dr.Shah [place], hypertension, hyperlipidemia, diabetes mellitus, COVID-19 06/15/2020, closed trimalleolar right ankle fracture 05/02/2020, medical noncompliance.  Recent admission was a long hospital 02/25/2021 to 03/05/2021 for multifocal CVA with concern for infectious/inflammatory etiology as well as possible enhancing occipital lobe lesion and did receive lumbar puncture and ruled out meningitis as well as subarachnoid hemorrhage.  Hospital course complicated by urinary retention as well as AKI.  MRI of the brain study 02/28/2021 showed prominent diffuse FLAIR hyperintensity within the cerebral sulci representing proteinaceous material related to meningitis or less likely subarachnoid hemorrhage.  Multiple punctate  infarcts bilateral cerebral hemispheres noted and cerebellar hemisphere and an unchanged appearance of 2 enhancing lesions in the left occipital lobe representing possible subacute infarct with hemorrhagic transformation versus malignancy.  Plan was for rescheduling of repeat imaging in approximately 4 weeks with neurology follow-up as there are concerns for severely uncontrolled vascular factors..  Per chart review patient lives with her husband and children.  Uses a rolling walker for mobility.  Required some assist for lower body dressing.  1 level apartment with level entry.  Multiple family members in and out during the day to assist.  Presented 03/06/2021 with rectal bleeding and frank blood clots.  She was having some left flank pain.  CT of the abdomen pelvis showed prominent edematous wall thickening in the rectum with perirectal and presacral edema.  Imaging suggesting infectious/inflammatory  proctitis.  Gastroenterology services consulted Dr. Outlaw.  Patient was placed on IV Cipro and Flagyl changed to ceftriaxone and Flagyl and later to Omnicef and Flagyl x10-day course.  Admission labs hemoglobin 10.2 compared to 8.79 days prior, glucose 181, BUN 22, creatinine 1.15, fecal occult blood positive.  Conservative care per gastroenterology services no current plan for colonoscopy.  Patient's Eliquis initially was held discussed with neurology service Dr. Kirkpatrick as well as gastroenterology services and was later cleared to resume Eliquis 03/12/2021.  Monitored for blood loss latest hemoglobin 8.2.  In regards to patient's AKI responded well to IV fluids and latest creatinine 1.00.  Patient's urinary retention continued to improve Foley catheter tube removed.  Urine culture did grow 80,000 yeast received 14 days of Diflucan.  Uncontrolled diabetes mellitus hemoglobin A1c 12.1 insulin therapy as directed.  She did have bouts of hypotension and antihypertensive medications were adjusted.  Therapy evaluations completed due to patient decreased functional mobility was to be admitted for a comprehensive rehab program. Complains that one of her eye drops causes burning.   Review of Systems  Constitutional:  Positive for malaise/fatigue. Negative for chills and fever.  HENT:  Negative for hearing loss.   Eyes:        Blindness left eye  Respiratory:  Negative for cough and shortness of breath.   Cardiovascular:  Positive for palpitations and leg swelling.  Gastrointestinal:  Positive for abdominal pain and blood in stool.  Genitourinary:  Positive for flank pain and urgency. Negative for dysuria.  Musculoskeletal:  Positive for joint pain and myalgias.  Skin:  Negative for rash.  Neurological:  Positive for weakness.  Psychiatric/Behavioral:  Positive for depression. The patient has insomnia.   All other systems reviewed and are negative. 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   Annotation: secondary to uveitis of unknown etiology Qualifier: Diagnosis of  By: Golding  DO, Beth     Hair loss    History of fracture of clavicle 05/18/2015   Hyperlipidemia    Hypertension    Hypertension associated with diabetes (HCC) 07/23/2016   Iron deficiency anemia 05/13/2013   Left anterior shoulder pain 07/06/2017   Pap smear abnormality of cervix with LGSIL    Routine/ritual circumcision    Type II diabetes mellitus (HCC)    Uveitis    Past Surgical History:  Procedure Laterality Date   CESAREAN SECTION     x 1   EYE SURGERY Bilateral    Lasik   LEFT HEART CATH AND CORONARY ANGIOGRAPHY N/A 02/05/2021   Procedure: LEFT HEART CATH AND CORONARY ANGIOGRAPHY;  Surgeon: Kadakia, Ajay, MD;  Location: [facility]IVE [facility];  Service: Cardiovascular;  Laterality: N/A;   MULTIPLE TOOTH EXTRACTIONS     bottom denture   ORIF ANKLE FRACTURE Right 05/05/2020   Procedure: OPEN REDUCTION INTERNAL FIXATION (ORIF) ANKLE FRACTURE;  Surgeon: Haddix, Kevin P, MD;  Location: [facility] OR;  Service: Orthopedics;  Laterality: Right;   Family History  Problem Relation Age of Onset   Diabetes Father    Hypertension Other    Social History:  reports that she has never smoked. She has never used smokeless tobacco. She reports that she does not drink alcohol and does not use drugs. Allergies: No Known Allergies Medications Prior to Admission  Medication Sig Dispense Refill   Accu-Chek FastClix Lancets MISC Check blood sugar 4 times a day 200 each 7   ACCU-CHEK GUIDE test strip CHECK BLOOD SUGAR 4 TIMES PER DAY 150 strip 12   amLODipine (NORVASC) 2.5 MG tablet TAKE 1 TABLET BY MOUTH TWICE A DAY 60 tablet 0    apixaban (ELIQUIS) 5 MG TABS tablet Take 1 tablet (5 mg total) by mouth 2 (two) times daily. 180 tablet 0   Blood Glucose Monitoring Suppl (ACCU-CHEK GUIDE) w/Device KIT 1 each by Does not apply route 4 (four) times daily. 1 kit 1   brimonidine (ALPHAGAN) 0.2 % ophthalmic solution Place 1 drop into the left eye 3 (three) times daily.     calcium citrate-vitamin D (CITRACAL+D) 315-200 MG-UNIT tablet Take 2 tablets by mouth 2 (two) times daily. 360 tablet 3   diclofenac Sodium (VOLTAREN) 1 % GEL Apply 2 g topically 4 (four) times daily as needed (foot pain).     dorzolamide-timolol (COSOPT) 22.3-6.8 MG/ML ophthalmic solution Place 1 drop into the left eye 2 (two) times daily.     feeding supplement (ENSURE ENLIVE / ENSURE PLUS) LIQD Take 237 mLs by mouth 2 (two) times daily between meals. 237 mL 12   fluconazole (DIFLUCAN) 200 MG tablet Take 1 tablet (200 mg total) by mouth daily for 12 days. 12 tablet 0   gabapentin (NEURONTIN) 300 MG capsule Take 1 capsule (300 mg total) by mouth at bedtime. 90 capsule 3   insulin aspart (NOVOLOG) 100 UNIT/ML FlexPen Inject 3 Units into the skin 3 (three) times daily with meals. 80 mL 0   insulin detemir (LEVEMIR) 100 UNIT/ML FlexPen Inject 15 Units into the skin at bedtime 15 mL 0   Insulin Pen Needle (B-D UF III   MINI PEN NEEDLES) 31G X 5 MM MISC Use pen needle daily for injections 100 each 2   Insulin Pen Needle (PENTIPS) 32G X 4 MM MISC Use as directed with insulin pen 100 each 3   linagliptin (TRADJENTA) 5 MG TABS tablet Take 1 tablet (5 mg total) by mouth daily. 90 tablet 0   metFORMIN (GLUCOPHAGE-XR) 500 MG 24 hr tablet TAKE 2 TABLETS (1,000 MG TOTAL) BY MOUTH IN THE MORNING AND AT BEDTIME. 360 tablet 1   [START ON 03/18/2021] Multiple Vitamin (MULTIVITAMIN WITH MINERALS) TABS tablet Take 1 tablet by mouth daily.      Drug Regimen Review Drug regimen was reviewed and remains appropriate with no significant issues identified  Home: Home  Living Family/patient expects to be discharged to:: Private residence Living Arrangements: Children, Spouse/significant other Available Help at Discharge: Family, Available 24 hours/day Type of Home: Apartment Home Access: Level entry Home Layout: One level Bathroom Shower/Tub: Chiropodist: Standard Bathroom Accessibility: Yes Home Equipment: Conservation officer, nature (2 wheels), Wheelchair - manual, BSC/3in1 Additional Comments: acces to RW, and chair for tub (a normal black chair per daughter)  Lives With: Son, Daughter, Spouse   Functional History: Prior Function Prior Level of Function : Needs assist Physical Assist : ADLs (physical) ADLs (physical): Bathing, Dressing, IADLs (tub transfers) Mobility Comments: Uses RW for mobility, uses WC outside for home ADLs Comments: Required assist for tub transfers and for LB dressing, families does household chores   Functional Status:  Mobility: Bed Mobility Overal bed mobility: Needs Assistance Bed Mobility: Supine to Sit Supine to sit: Supervision General bed mobility comments: sittign EOB with son present, alarm on Transfers Overall transfer level: Needs assistance Equipment used: Rolling walker (2 wheels) Transfers: Sit to/from Stand Sit to Stand: Min guard General transfer comment: min/guard to transfer from recliner and to bed, repeated for instruction. verbal cues for hand placement, control of descent and RW positioning Ambulation/Gait Ambulation/Gait assistance: Min guard, Min assist Gait Distance (Feet): 60 Feet Assistive device: Rolling walker (2 wheels) Gait Pattern/deviations: Step-through pattern, Step-to pattern, Decreased stride length, Decreased dorsiflexion - right, Decreased dorsiflexion - left General Gait Details: R foot drop with slight hip external rotation and  decr circumduction compensations, minimal L dorsiflexion with foot clearance, able to amb distance above with one standing rest Gait  velocity: decreased   ADL: ADL Overall ADL's : Needs assistance/impaired Eating/Feeding: Set up, Sitting Grooming: Set up, Sitting Upper Body Bathing: Set up, Sitting Lower Body Bathing: Minimal assistance, Sit to/from stand Upper Body Dressing : Sitting, Set up Upper Body Dressing Details (indicate cue type and reason): min assist for fasteners Lower Body Dressing: Sit to/from stand, Moderate assistance Lower Body Dressing Details (indicate cue type and reason): Patient able to don socks with figure four method - did drop sock needing assistance to pick it up from floor. requires assistance to don underwear. Toilet Transfer: Min guard, Grab bars, Rolling walker (2 wheels), Ambulation Toilet Transfer Details (indicate cue type and reason): with increased time and education to keep walker with her with turning. Toileting- Water quality scientist and Hygiene: Min guard, Sit to/from stand Toileting - Water quality scientist Details (indicate cue type and reason): patient needed min guard with education to keep RW close. Functional mobility during ADLs: Min guard, Rolling walker (2 wheels) General ADL Comments: Reliance on walker with ADls limits upper extremity use for ADLs.   Cognition: Cognition Overall Cognitive Status: Within Functional Limits for tasks assessed Orientation Level: Oriented X4 Cognition Arousal/Alertness: Awake/alert Behavior  During Therapy: WFL for tasks assessed/performed Overall Cognitive Status: Within Functional Limits for tasks assessed Area of Impairment: Safety/judgement General Comments: patient was noted to be impulsive with ability to follow all commands. Difficult to assess due to: Non-English speaking    Physical Exam: Blood pressure 133/66, pulse 83, temperature 99.2 F (37.3 C), temperature source Oral, resp. rate 17, height 5' 6" (1.676 m), weight 76.1 kg, last menstrual period 08/21/2016, SpO2 100 %. Physical Exam Gen: no distress, normal  appearing HEENT: oral mucosa pink and moist, left eye blind Cardio: Reg rate Chest: normal effort, normal rate of breathing Abd: soft, non-distended Ext: no edema Psych: pleasant, normal affect Skin: intact Neuro: Musculoskeletal:  Neurological:     Comments: Patient is resting comfortably.  Makes eye contact with examiner.  Limited English speaking primarily speaks Arabic.  Follows simple commands.  She does have family at bedside. Right foot drop. Impaired left sided dorsiflexion as well.   Results for orders placed or performed during the hospital encounter of 03/17/21 (from the past 48 hour(s))  Glucose, capillary     Status: Abnormal   Collection Time: 03/17/21  4:23 PM  Result Value Ref Range   Glucose-Capillary 126 (H) 70 - 99 mg/dL    Comment: Glucose reference range applies only to samples taken after fasting for at least 8 hours.   No results found.     Medical Problem List and Plan: 1.  Debility functional deficits secondary to proctitis/colitis/GI bleed/multi medical  -patient may shower  -ELOS/Goals: 5-7 days modI 2.  Antithrombotics: -DVT/anticoagulation:  Pharmaceutical: Other (comment) Eliquis  -antiplatelet therapy: N/A 3. Pain Management: Neurontin 300 mg nightly 4. Mood: Provide emotional support  -antipsychotic agents: N/A 5. Neuropsych: This patient is capable of making decisions on her own behalf. 6. Skin/Wound Care: Routine skin checks 7. Fluids/Electrolytes/Nutrition: Routine in and outs with follow-up chemistries 8.  Proctitis/colitis/GI bleed.  Follow-up GI Dr. Outlaw.  Completed course of Omnicef and Flagyl.  Follow-up CBC 9.  Prior CVA/abnormal MRI.  Recent admission 02/25/2021 to 03/05/2021.  Plan repeat MRI 4 weeks per neurology services 10.  Atrial fibrillation.  Cardiac rate controlled.  Continue Eliquis. Check magnesium level Monday morning. 11.  Hypertension.  Norvasc 2.5 mg twice daily.  Monitor with increased mobility 12.  Uncontrolled  diabetes mellitus.  Hemoglobin A1c greater than 12.  Levemir 18 units nightly.  Check blood sugars before meals and at bedtime.  Diabetic teaching 13.  AKI versus CKD.  Follow-up chemistries 14.  Glaucoma.  Left eye blindness.  Follow Dr. Shah [place].  Continue eyedrops. She asked that Bromidine be discontinued due to burning. I have done so, and asked husband to call Dr. Shah to scheduled outpatient ophthalmology appointment for soon after she leaves here.  15.  Urinary retention.  Foley tube removed.  Follow-up outpatient urology as needed 16.  Medical noncompliance.  Counseling 17. Overweight BMI 27.08: provide dietary education.   I have personally performed a face to face diagnostic evaluation, including, but not limited to relevant history and physical exam findings, of this patient and developed relevant assessment and plan.  Additionally, I have reviewed and concur with the physician assistant's documentation above.  Daniel J Angiulli, PA-C   Krutika P Raulkar, MD 03/17/2021  

## 2021-03-17 NOTE — TOC Transition Note (Addendum)
Transition of Care [facility]) - CM/SW Discharge Note   Patient Details  Name: Jennifer [name] MRN: [id number] Date of Birth: November 01, 1970  [facility]) CM/SW Contact:  Ross Ludwig, LCSW Phone Number: 03/17/2021, 10:31 AM   Clinical Narrative:     CSW was informed that patient has a bed ready at [facility] for discharge today.  Patient to be d/c'ed today to CIR.  Patient and family agreeable to plans will transport via [facility] RN to call report.    Final next level of care: IP Rehab Facility Barriers to Discharge: Barriers Resolved   Patient Goals and CMS Choice Patient states their goals for this hospitalization and ongoing recovery are:: To go to inpatient rehab, then return back home. CMS Medicare.gov Compare Post Acute Care list provided to:: Patient Choice offered to / list presented to : Patient  Discharge Placement              Patient chooses bed at: Other - please specify in the comment section below: [facility].) Patient to be transferred to facility by: [facility]   Patient and family notified of of transfer: 03/17/21  Discharge Plan and Services                                     Social Determinants of Health (SDOH) Interventions     Readmission Risk Interventions Readmission Risk Prevention Plan 04/12/2020  Transportation Screening Complete  PCP or Specialist Appt within 3-5 Days Complete  HRI or [place] Complete  Social Work Consult for [place] Planning/Counseling Complete  Palliative Care Screening Not Applicable  Medication Review Press photographer) Complete  Some recent data might be hidden

## 2021-03-17 NOTE — Progress Notes (Signed)
Inpatient Rehab Admissions Coordinator:   I have a CIR bed for this Pt. Today.. RN may call report to [phone]. Transport set for ~11:30.  Clemens Catholic, [place], [place] Admissions Coordinator  [phone] ([place]) [phone] (office)

## 2021-03-17 NOTE — Progress Notes (Signed)
Patient arrived via stretcher from [facility]. Patient is A&O X 4 and able to make her basic needs known. Patient first language is Arabic. Family assists with translation when needed. Bilateral lungs clear, ABD soft non distended bowel sounds hyperactive x 4. Patient states that she had 2 BM today and 4 BM yesterday. Patient received Lomotil at previous hospital for loose stools. Bilateral lower legs edematous. +2 pitting edema in LLE and +3 pitting edema in RLE.  Cap refill less than 3 seconds, positive pedal pulses and feet warm to touch. Skin CDI, IV noted in left forearm, flushed without difficulty. Call light within reach, patient understands the use of the call light. Bed in low position, safety measures in place. Family at bedside.

## 2021-03-18 ENCOUNTER — Inpatient Hospital Stay (HOSPITAL_COMMUNITY): Payer: Medicaid Other

## 2021-03-18 DIAGNOSIS — R5381 Other malaise: Secondary | ICD-10-CM | POA: Diagnosis not present

## 2021-03-18 LAB — GLUCOSE, CAPILLARY
Glucose-Capillary: 106 mg/dL — ABNORMAL HIGH (ref 70–99)
Glucose-Capillary: 189 mg/dL — ABNORMAL HIGH (ref 70–99)
Glucose-Capillary: 147 mg/dL — ABNORMAL HIGH (ref 70–99)
Glucose-Capillary: 170 mg/dL — ABNORMAL HIGH (ref 70–99)

## 2021-03-18 NOTE — Progress Notes (Signed)
PROGRESS NOTE   Subjective/Complaints: Working with therapy Spoke with sister, husband, and daughter Daughter requests SCDs at night to prevent clot, ordered Daughter mentions lower extremity swelling, recommended low sodium diet to help with this  ROS: +Bilateral lower extremity weakness   Objective:   No results found. Recent Labs    03/16/21 0437 03/17/21 1108  WBC 7.9 8.3  HGB 8.2* 8.7*  HCT 27.0* 28.2*  PLT 169 142*   Recent Labs    03/16/21 0437 03/17/21 1108  NA 138 137  K 4.0 4.3  CL 109 107  CO2 24 23  GLUCOSE 100* 141*  BUN 37* 30*  CREATININE 1.00 0.96  CALCIUM 8.8* 8.4*    Intake/Output Summary (Last 24 hours) at 03/18/2021 1334 Last data filed at 03/18/2021 1306 Gross per 24 hour  Intake 480 ml  Output --  Net 480 ml        Physical Exam: Vital Signs Blood pressure (!) 151/79, pulse 86, temperature 98.2 F (36.8 C), resp. rate 17, height 5\' 6"  (1.676 m), weight 76.1 kg, last menstrual period 08/21/2016, SpO2 100 %. Gen: no distress, normal appearing HEENT: oral mucosa pink and moist, left eye blind Cardio: Reg rate Chest: normal effort, normal rate of breathing Abd: soft, non-distended Ext: bilateral lower extremity edema Psych: pleasant, normal affect Skin: intact Neurological:     Comments: Patient is resting comfortably.  Makes eye contact with examiner.  Limited English speaking primarily speaks Arabic.  Follows simple commands.  She does have family at bedside. Right foot drop. Impaired left sided dorsiflexion as well. Bilateral hip weakness, core weakness. LUE with limited shoulder range of motion due to pain.    Assessment/Plan: 1. Functional deficits which require 3+ hours per day of interdisciplinary therapy in a comprehensive inpatient rehab setting. Physiatrist is providing close team supervision and 24 hour management of active medical problems listed below. Physiatrist  and rehab team continue to assess barriers to discharge/monitor patient progress toward functional and medical goals  Care Tool:  Bathing              Bathing assist Assist Level: Minimal Assistance - Patient > 75%     Upper Body Dressing/Undressing Upper body dressing        Upper body assist Assist Level: Supervision/Verbal cueing    Lower Body Dressing/Undressing Lower body dressing            Lower body assist Assist for lower body dressing: Minimal Assistance - Patient > 75%     Toileting Toileting    Toileting assist Assist for toileting: Minimal Assistance - Patient > 75%     Transfers Chair/bed transfer  Transfers assist     Chair/bed transfer assist level: Minimal Assistance - Patient > 75%     Locomotion Ambulation   Ambulation assist      Assist level: Minimal Assistance - Patient > 75% Assistive device: Walker-rolling Max distance: 45   Walk 10 feet activity   Assist     Assist level: Minimal Assistance - Patient > 75% Assistive device: Walker-rolling   Walk 50 feet activity   Assist Walk 50 feet with 2 turns activity did not occur: Safety/medical  concerns (fatigued)         Walk 150 feet activity   Assist Walk 150 feet activity did not occur: Safety/medical concerns (fatigue)         Walk 10 feet on uneven surface  activity   Assist Walk 10 feet on uneven surfaces activity did not occur: N/A         Wheelchair     Assist Is the patient using a wheelchair?: Yes Type of Wheelchair: Manual    Wheelchair assist level: Minimal Assistance - Patient > 75% Max wheelchair distance: 150    Wheelchair 50 feet with 2 turns activity    Assist        Assist Level: Minimal Assistance - Patient > 75%   Wheelchair 150 feet activity     Assist      Assist Level: Minimal Assistance - Patient > 75%   Blood pressure (!) 151/79, pulse 86, temperature 98.2 F (36.8 C), resp. rate 17, height 5\' 6"   (1.676 m), weight 76.1 kg, last menstrual period 08/21/2016, SpO2 100 %.  Medical Problem List and Plan: 1.  Debility functional deficits secondary to proctitis/colitis/GI bleed/multi medical             -patient may shower             -ELOS/Goals: 5-7 days modI  Continue CIR 2.  Impaired mobility: added SCDs at night as per daughter's request.  -DVT/anticoagulation:  Pharmaceutical: Other (comment) Eliquis             -antiplatelet therapy: N/A 3. Left shoulder pain and limited range of motion: Shoulder XR ordered. Neurontin 300 mg nightly 4. Mood: Provide emotional support             -antipsychotic agents: N/A 5. Neuropsych: This patient is capable of making decisions on her own behalf. 6. Skin/Wound Care: Routine skin checks 7. Fluids/Electrolytes/Nutrition: Routine in and outs with follow-up chemistries 8.  Proctitis/colitis/GI bleed.  Follow-up GI Dr. Paulita Fujita.  Completed course of Omnicef and Flagyl.  Follow-up CBC 9.  Prior CVA/abnormal MRI.  Recent admission 02/25/2021 to 03/05/2021.  Plan repeat MRI 4 weeks per neurology services 10.  Atrial fibrillation.  Cardiac rate controlled.  Continue Eliquis. Check magnesium level Monday morning. 11.  Hypertension.  Norvasc 2.5 mg twice daily.  Monitor with increased mobility 12.  Uncontrolled diabetes mellitus.  Hemoglobin A1c greater than 12.  Levemir 18 units nightly.  Check blood sugars before meals and at bedtime.  Diabetic teaching 13.  AKI versus CKD.  Follow-up chemistries 14.  Glaucoma.  Left eye blindness.  Follow [facility].  Continue eyedrops. She asked that Bromidine be discontinued due to burning. I have done so, and asked husband to call Dr. Manuella Ghazi to scheduled outpatient ophthalmology appointment for soon after she leaves here. Messaged Neoma Laming to help get husband permanent handicap placard on Monday.  15.  Urinary retention.  Foley tube removed.  Follow-up outpatient urology as needed 16.  Medical noncompliance.   Counseling 17. Overweight BMI 27.08: provide dietary education.  18. Bilateral lower extremity edema: recommended low sodium diet    LOS: 1 days A FACE TO FACE EVALUATION WAS PERFORMED  Jennifer [name] Jennifer [name] 03/18/2021, 1:34 PM

## 2021-03-18 NOTE — Evaluation (Addendum)
Occupational Therapy Assessment and Plan  Patient Details  Name: Jennifer [name] MRN: [id number] Date of Birth: [birth year]  OT Diagnosis: abnormal posture, acute pain, and muscle weakness (generalized) Rehab Potential: Rehab Potential (ACUTE ONLY): Good ELOS: 2 weeks   Today's Date: 03/18/2021 OT Individual Time: 1005-1105 OT Individual Time Calculation (min): 60 min     Hospital Problem: Principal Problem:   Debility   Past Medical History:  Past Medical History:  Diagnosis Date   Anemia, iron deficiency    Atrial fibrillation ([place])    Blindness of left eye    Closed trimalleolar fracture of right ankle 05/02/2020   Added automatically from request for surgery [id number]   COVID-19 virus infection 06/15/2020   Depression    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   Annotation: secondary to uveitis of unknown etiology Qualifier: Diagnosis of  By: Hilma Favors  DO, Beth     Hair loss    History of fracture of clavicle 05/18/2015   Hyperlipidemia    Hypertension    Hypertension associated with diabetes ([place]) 07/23/2016   Iron deficiency anemia 05/13/2013   Left anterior shoulder pain 07/06/2017   Pap smear abnormality of cervix with LGSIL    Routine/ritual circumcision    Type II diabetes mellitus ([place])    Uveitis    Past Surgical History:  Past Surgical History:  Procedure Laterality Date   CESAREAN SECTION     x 1   EYE SURGERY Bilateral    Lasik   LEFT HEART CATH AND CORONARY ANGIOGRAPHY N/A 02/05/2021   Procedure: LEFT HEART CATH AND CORONARY ANGIOGRAPHY;  Surgeon: Dixie Dials, MD;  Location: [facility];  Service: Cardiovascular;  Laterality: N/A;   MULTIPLE TOOTH EXTRACTIONS     bottom denture   ORIF ANKLE FRACTURE Right 05/05/2020   Procedure: OPEN REDUCTION INTERNAL FIXATION (ORIF) ANKLE FRACTURE;  Surgeon: Shona Needles, MD;  Location: [facility];  Service: Orthopedics;  Laterality: Right;    Assessment & Plan Clinical Impression:  Jennifer [name] is a 50 year old right-handed limited English speaking female with complicated medical history of atrial fibrillation maintained on Eliquis, non-STEMI 10/22, left eye visual impairment/glaucoma followed by [place], hypertension, hyperlipidemia, diabetes mellitus, COVID-19 06/15/2020, closed trimalleolar right ankle fracture 05/02/2020, medical noncompliance.  Recent admission was a long hospital 02/25/2021 to 03/05/2021 for multifocal CVA with concern for infectious/inflammatory etiology as well as possible enhancing occipital lobe lesion and did receive lumbar puncture and ruled out meningitis as well as subarachnoid hemorrhage.  Hospital course complicated by urinary retention as well as AKI.  MRI of the brain study 02/28/2021 showed prominent diffuse FLAIR hyperintensity within the cerebral sulci representing proteinaceous material related to meningitis or less likely subarachnoid hemorrhage.  Multiple punctate  infarcts bilateral cerebral hemispheres noted and cerebellar hemisphere and an unchanged appearance of 2 enhancing lesions in the left occipital lobe representing possible subacute infarct with hemorrhagic transformation versus malignancy.  Plan was for rescheduling of repeat imaging in approximately 4 weeks with neurology follow-up as there are concerns for severely uncontrolled vascular factors..  Per chart review patient lives with her husband and children.  Uses a rolling walker for mobility.  Required some assist for lower body dressing.  1 level apartment with level entry.  Multiple family members in and out during the day to assist.  Presented 03/06/2021 with rectal bleeding and frank blood clots.  She was having some left flank pain.  CT of the abdomen pelvis showed prominent edematous wall thickening in the  rectum with perirectal and presacral edema.  Imaging suggesting infectious/inflammatory proctitis.  Gastroenterology services consulted Dr. Paulita Fujita.  Patient was placed  on IV Cipro and Flagyl changed to ceftriaxone and Flagyl and later to [facility] and Flagyl x10-day course.  Admission labs hemoglobin 10.2 compared to 8.79 days prior, glucose 181, BUN 22, creatinine 1.15, fecal occult blood positive.  Conservative care per gastroenterology services no current plan for colonoscopy.  Patient's Eliquis initially was held discussed with neurology service Dr. Leonel Ramsay as well as gastroenterology services and was later cleared to resume Eliquis 03/12/2021.  Monitored for blood loss latest hemoglobin 8.2.  In regards to patient's AKI responded well to IV fluids and latest creatinine 1.00.  Patient's urinary retention continued to improve Foley catheter tube removed.  Urine culture did grow 80,000 yeast received 14 days of Diflucan.  Uncontrolled diabetes mellitus hemoglobin A1c 12.1 insulin therapy as directed.  She did have bouts of hypotension and antihypertensive medications were adjusted. .  Patient transferred to CIR on 03/17/2021 .    Patient currently requires min with basic self-care skills secondary to muscle weakness, decreased cardiorespiratoy endurance, decreased coordination and decreased motor planning, and decreased sitting balance, decreased standing balance, decreased postural control, and decreased balance strategies.  Prior to hospitalization, patient could complete BADLs with modified independent .  Patient will benefit from skilled intervention to decrease level of assist with basic self-care skills prior to discharge home with care partner.  Anticipate patient will require intermittent supervision and follow up home health.  OT - End of Session Activity Tolerance: Tolerates 30+ min activity with multiple rests Endurance Deficit: Yes Endurance Deficit Description: very limited standing tolerance; reports weakness in legs after standing for approximately 1 minute OT Assessment Rehab Potential (ACUTE ONLY): Good OT Barriers to Discharge: Lack of/limited  family support (Pt does have dtr and husband living with her; however they report that they work and dtr goes to school and there will be periods of time when patient is by herself at home.) OT Patient demonstrates impairments in the following area(s): Balance;Endurance;Motor;Pain;Safety;Sensory;Vision OT Basic ADL's Functional Problem(s): Grooming;Bathing;Toileting;Dressing OT Transfers Functional Problem(s): Toilet OT Additional Impairment(s): Fuctional Use of Upper Extremity OT Plan OT Intensity: Minimum of 1-2 x/day, 45 to 90 minutes OT Frequency: 5 out of 7 days OT Duration/Estimated Length of Stay: 2 weeks OT Treatment/Interventions: Balance/vestibular training;Discharge planning;Disease Lawyer;Functional electrical stimulation;Functional mobility training;Neuromuscular re-education;Pain management;Patient/family education;Psychosocial support;Self Care/advanced ADL retraining;Therapeutic Activities;Therapeutic Exercise;UE/LE Strength taining/ROM;UE/LE Coordination activities;Visual/perceptual remediation/compensation;Wheelchair propulsion/positioning OT Basic Self-Care Anticipated Outcome(s): supervision-mod I OT Toileting Anticipated Outcome(s): mod I OT Bathroom Transfers Anticipated Outcome(s): mod I OT Recommendation OT follow up: Home Health Patient destination: Home Equipment Recommended: To be determined   OT Evaluation Precautions/Restrictions  Precautions Precautions: Fall (right foot drop; need interpreter) Restrictions Weight Bearing Restrictions: No Pain Pain Assessment Faces Pain Scale: Hurts a little bit Pain Type: Other (Comment) (has had diarrhea) Pain Location: Abdomen Pain Intervention(s): Medication (See eMAR) Home Living/Prior [place] expects to be discharged to:: Private residence Living Arrangements: Children, Spouse/significant other Available Help at Discharge: Family,  Available PRN/intermittently Type of Home: Apartment Home Access: Level entry Home Layout: One level Bathroom Shower/Tub: Chiropodist: Handicapped height Bathroom Accessibility: Yes  Lives With: Spouse, Daughter IADL History Homemaking Responsibilities: No Prior Function Level of Independence: Requires assistive device for independence, Needs assistance with ADLs, Independent with gait, Independent with transfers (Mod I, however has had multiple falls recently)  Able to Take Stairs?: No Driving: No Vocation:  Unemployed Vision Baseline Vision/History: 2 Legally blind Ability to see in adequate lighting: 0 Adequate Patient Visual Report: No change from baseline Vision Assessment?: Vision impaired- to be further tested in functional context Perception  Perception: Within Functional Limits Praxis Praxis: Intact Cognition Overall Cognitive Status: Within Functional Limits for tasks assessed Arousal/Alertness: Awake/alert Orientation Level: Person;Place;Situation Person: Oriented Place: Oriented Situation: Oriented Year: 2022 Month: November Day of Week: Correct Memory: Appears intact Immediate Memory Recall: Sock;Blue;Bed Memory Recall Sock: Without Cue Memory Recall Blue: Without Cue Memory Recall Bed: Without Cue Attention: Focused;Sustained;Selective Focused Attention: Appears intact Sustained Attention: Appears intact Selective Attention: Appears intact Awareness: Appears intact Problem Solving: Appears intact Safety/Judgment: Appears intact Sensation Sensation Light Touch: Appears Intact Proprioception: Appears Intact Coordination Gross Motor Movements are Fluid and Coordinated: No Finger Nose Finger Test: WFL BUE Heel Shin Test: unable due to hip weakness bil Motor  Motor Motor: Motor apraxia Motor - Skilled Clinical Observations: Right foot drop with hip externally rotated during gait and transfers  Trunk/Postural Assessment  Cervical  Assessment Cervical Assessment:  (slight forward head) Thoracic Assessment Thoracic Assessment:  (rounded shoulders) Lumbar Assessment Lumbar Assessment:  (slight posterior pelvic tilt) Postural Control Postural Control:  (delayed right reactions)  Balance Balance Balance Assessed: Yes Static Sitting Balance Static Sitting - Balance Support: No upper extremity supported;Feet supported Static Sitting - Level of Assistance: 5: Stand by assistance Dynamic Sitting Balance Dynamic Sitting - Balance Support: No upper extremity supported;Feet supported Dynamic Sitting - Level of Assistance: 4: Min Insurance risk surveyor Standing - Balance Support: Bilateral upper extremity supported Static Standing - Level of Assistance: 4: Min assist Dynamic Standing Balance Dynamic Standing - Balance Support: Bilateral upper extremity supported;During functional activity Dynamic Standing - Level of Assistance: 3: Mod assist Extremity/Trunk Assessment RUE Assessment RUE Assessment: Within Functional Limits LUE Assessment LUE Assessment: Exceptions to [facility] Passive Range of Motion (PROM) Comments: Limited shoulder flexion/abduction/ER (Daughter reports she has had painful shoulder for past two years but noone has done anything about it despite patient interested in having it treated.) Active Range of Motion (AROM) Comments: Approximately 90 degrees FF and ABD General Strength Comments: Strength in shoulder limited by pain; elbow and hand 3+/5  Care Tool Care Tool Self Care Eating   Eating Assist Level: Set up assist    Oral Care    Oral Care Assist Level: Supervision/Verbal cueing    Bathing         Assist Level: Minimal Assistance - Patient > 75%    Upper Body Dressing(including orthotics)       Assist Level: Supervision/Verbal cueing    Lower Body Dressing (excluding footwear)     Assist for lower body dressing: Minimal Assistance - Patient > 75%    Putting on/Taking off  footwear     Assist for footwear: Minimal Assistance - Patient > 75%       Care Tool Toileting Toileting activity   Assist for toileting: Minimal Assistance - Patient > 75%     Care Tool Bed Mobility Roll left and right activity   Roll left and right assist level: Minimal Assistance - Patient > 75%    Sit to lying activity   Sit to lying assist level: Minimal Assistance - Patient > 75%    Lying to sitting on side of bed activity   Lying to sitting on side of bed assist level: the ability to move from lying on the back to sitting on the side of the bed with no  back support.: Minimal Assistance - Patient > 75%     Care Tool Transfers Sit to stand transfer   Sit to stand assist level: Minimal Assistance - Patient > 75%    Chair/bed transfer   Chair/bed transfer assist level: Minimal Assistance - Patient > 75%     Toilet transfer   Assist Level: Minimal Assistance - Patient > 75%     Care Tool Cognition  Expression of Ideas and Wants Expression of Ideas and Wants: 3. Some difficulty - exhibits some difficulty with expressing needs and ideas (e.g, some words or finishing thoughts) or speech is not clear  Understanding Verbal and Non-Verbal Content Understanding Verbal and Non-Verbal Content: 3. Usually understands - understands most conversations, but misses some part/intent of message. Requires cues at times to understand   Memory/Recall Ability Memory/Recall Ability : Current season;That he or she is in a hospital/hospital unit   Refer to Care Plan for [place] 1 OT Short Term Goal 1 (Week 1): Pt will complete toileting with supervision OT Short Term Goal 2 (Week 1): Pt will complete stand pivot to [facility] with CGA OT Short Term Goal 3 (Week 1): Pt will dress LB with supervision OT Short Term Goal 4 (Week 1): Pt will bathe UB/LB with CGA  Recommendations for other services: None    Skilled Therapeutic Intervention ADL ADL Grooming:  Setup Where Assessed-Grooming: Sitting at sink Upper Body Bathing:  (pt politely refused) Lower Body Bathing:  (pt politely refused) Upper Body Dressing: Supervision/safety Where Assessed-Upper Body Dressing: Edge of bed Lower Body Dressing: Minimal assistance Where Assessed-Lower Body Dressing: Edge of bed Toileting: Minimal assistance Where Assessed-Toileting: Bedside Commode Toilet Transfer: Minimal assistance Toilet Transfer Method: Stand pivot Toilet Transfer Equipment: Bedside commode Mobility  Transfers Sit to Stand: Minimal Assistance - Patient > 75% Stand to Sit: Minimal Assistance - Patient > 75%  Skilled Intervention:  Pt sitting EOB with interpreter present for communication needs.  Dtr and husband and also sister present for entire session.  Initial evaluation completed and collaborated with pt and family regarding OT POC and initiated DC planning.  Self care and mobility completed per above levels of assist.  Noted bilateral foot edema approximately grade 3.  Nurse and MD made aware.  Pt requesting to sit up in w/c at end of session. Call bell in reach, seat alarm on.  Discharge Criteria: Patient will be discharged from OT if patient refuses treatment 3 consecutive times without medical reason, if treatment goals not met, if there is a change in medical status, if patient makes no progress towards goals or if patient is discharged from hospital.  The above assessment, treatment plan, treatment alternatives and goals were discussed and mutually agreed upon: by patient and by family  Ezekiel Slocumb 03/18/2021, 12:51 PM

## 2021-03-18 NOTE — Discharge Instructions (Addendum)
Inpatient Rehab Discharge Instructions  [place] Discharge date and time: No discharge date for patient encounter.   Activities/Precautions/ Functional Status: Activity: As tolerated Diet: Regular Wound Care: Routine skin checks Functional status:  ___ No restrictions     ___ Walk up steps independently ___ 24/7 supervision/assistance   ___ Walk up steps with assistance ___ Intermittent supervision/assistance  ___ Bathe/dress independently ___ Walk with walker     _x__ Bathe/dress with assistance ___ Walk Independently    ___ Shower independently ___ Walk with assistance    ___ Shower with assistance ___ No alcohol     ___ Return to work/school ________  Special Instructions: No driving smoking or alcohol  Follow-up neurology services [phone] regards to repeat CT/MRI 4 weeks follow-up CVA question brain lesion  COMMUNITY REFERRALS UPON DISCHARGE:    HOME EXERCISE PROGRAM GIVEN TO PATIENT AND FAMILY   Outpatient: PT             Agency: [facility] [place] [address] Phone: [phone]             Appointment Date/Time:WILL CALL TO SET UP APPOINTMENTS  Medical Equipment/Items Ordered: PATIENT HAS ALL EQUIPMENT FROM PAST ADMITS                                                 Agency/Supplier; NA  My questions have been answered and I understand these instructions. I will adhere to these goals and the provided educational materials after my discharge from the hospital.  Patient/Caregiver Signature _______________________________ Date __________  Clinician Signature _______________________________________ Date __________  Please bring this form and your medication list with you to all your follow-up doctor's appointments.    Information on my medicine - ELIQUIS (apixaban)  This medication education was reviewed with me or my healthcare representative as part of my discharge preparation.  The pharmacist that spoke with me during my  hospital stay was:    Why was Eliquis prescribed for you? Eliquis was prescribed for you to reduce the risk of a blood clot forming that can cause a stroke if you have a medical condition called atrial fibrillation (a type of irregular heartbeat).  What do You need to know about Eliquis ? Take your Eliquis TWICE DAILY - one tablet in the morning and one tablet in the evening with or without food. If you have difficulty swallowing the tablet whole please discuss with your pharmacist how to take the medication safely.  Take Eliquis exactly as prescribed by your doctor and DO NOT stop taking Eliquis without talking to the doctor who prescribed the medication.  Stopping may increase your risk of developing a stroke.  Refill your prescription before you run out.  After discharge, you should have regular check-up appointments with your healthcare provider that is prescribing your Eliquis.  In the future your dose may need to be changed if your kidney function or weight changes by a significant amount or as you get older.  What do you do if you miss a dose? If you miss a dose, take it as soon as you remember on the same day and resume taking twice daily.  Do not take more than one dose of ELIQUIS at the same time to make up a missed dose.  Important Safety Information A possible side effect of Eliquis is bleeding. You  should call your healthcare provider right away if you experience any of the following: Bleeding from an injury or your nose that does not stop. Unusual colored urine (red or dark brown) or unusual colored stools (red or black). Unusual bruising for unknown reasons. A serious fall or if you hit your head (even if there is no bleeding).  Some medicines may interact with Eliquis and might increase your risk of bleeding or clotting while on Eliquis. To help avoid this, consult your healthcare provider or pharmacist prior to using any new prescription or non-prescription  medications, including herbals, vitamins, non-steroidal anti-inflammatory drugs (NSAIDs) and supplements.  This website has more information on Eliquis (apixaban): http://[email or web address].com/eliquis/home

## 2021-03-18 NOTE — Evaluation (Addendum)
Physical Therapy Assessment and Plan  Patient Details  Name: Jennifer [name] MRN: [id number] Date of Birth: 04/14/1971  PT Diagnosis: Abnormal posture, Abnormality of gait, Contracture of joint: bil ankles, Difficulty walking, and Edema Rehab Potential: Good ELOS: 14-17 days   Today's Date: 03/18/2021 PT Individual Time: 1100-1210 PT Individual Time Calculation (min): 70 min    [name] Problem: Principal Problem:   Debility   Past Medical History:  Past Medical History:  Diagnosis Date   Anemia, iron deficiency    Atrial fibrillation ([place])    Blindness of left eye    Closed trimalleolar fracture of right ankle 05/02/2020   Added automatically from request for surgery [id number]   COVID-19 virus infection 06/15/2020   Depression    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   Annotation: secondary to uveitis of unknown etiology Qualifier: Diagnosis of  By: Hilma Favors  DO, Beth     Hair loss    History of fracture of clavicle 05/18/2015   Hyperlipidemia    Hypertension    Hypertension associated with diabetes ([place]) 07/23/2016   Iron deficiency anemia 05/13/2013   Left anterior shoulder pain 07/06/2017   Pap smear abnormality of cervix with LGSIL    Routine/ritual circumcision    Type II diabetes mellitus ([place])    Uveitis    Past Surgical History:  Past Surgical History:  Procedure Laterality Date   CESAREAN SECTION     x 1   EYE SURGERY Bilateral    Lasik   LEFT HEART CATH AND CORONARY ANGIOGRAPHY N/A 02/05/2021   Procedure: LEFT HEART CATH AND CORONARY ANGIOGRAPHY;  Surgeon: Dixie Dials, MD;  Location: [facility];  Service: Cardiovascular;  Laterality: N/A;   MULTIPLE TOOTH EXTRACTIONS     bottom denture   ORIF ANKLE FRACTURE Right 05/05/2020   Procedure: OPEN REDUCTION INTERNAL FIXATION (ORIF) ANKLE FRACTURE;  Surgeon: Shona Needles, MD;  Location: [facility];  Service: Orthopedics;  Laterality: Right;    Assessment & Plan Clinical  Impression: Jennifer [name] is a 50 year old right-handed limited English speaking female with complicated medical history of atrial fibrillation maintained on Eliquis, non-STEMI 10/22, left eye visual impairment/glaucoma followed by [name], hypertension, hyperlipidemia, diabetes mellitus, COVID-19 06/15/2020, closed trimalleolar right ankle fracture 05/02/2020, medical noncompliance.  Recent admission was a long [name] 02/25/2021 to 03/05/2021 for multifocal CVA with concern for infectious/inflammatory etiology as well as possible enhancing occipital lobe lesion and did receive lumbar puncture and ruled out meningitis as well as subarachnoid hemorrhage.  [name] course complicated by urinary retention as well as AKI.  MRI of the brain study 02/28/2021 showed prominent diffuse FLAIR hyperintensity within the cerebral sulci representing proteinaceous material related to meningitis or less likely subarachnoid hemorrhage.  Multiple punctate  infarcts bilateral cerebral hemispheres noted and cerebellar hemisphere and an unchanged appearance of 2 enhancing lesions in the left occipital lobe representing possible subacute infarct with hemorrhagic transformation versus malignancy.  Plan was for rescheduling of repeat imaging in approximately 4 weeks with neurology follow-up as there are concerns for severely uncontrolled vascular factors..  Per chart review patient lives with her husband and children.  Uses a rolling walker for mobility.  Required some assist for lower body dressing.  1 level apartment with level entry.  Multiple family members in and out during the day to assist.  Presented 03/06/2021 with rectal bleeding and frank blood clots.  She was having some left flank pain.  CT of the abdomen pelvis showed prominent edematous wall thickening in  the rectum with perirectal and presacral edema.  Imaging suggesting infectious/inflammatory proctitis.  Gastroenterology services consulted Dr. Paulita Fujita.  Patient  was placed on IV Cipro and Flagyl changed to ceftriaxone and Flagyl and later to Jennifer [name] and Flagyl x10-day course.  Admission labs hemoglobin 10.2 compared to 8.79 days prior, glucose 181, BUN 22, creatinine 1.15, fecal occult blood positive.  Conservative care per gastroenterology services no current plan for colonoscopy.  Patient's Eliquis initially was held discussed with neurology service Dr. Leonel Ramsay as well as gastroenterology services and was later cleared to resume Eliquis 03/12/2021.  Monitored for blood loss latest hemoglobin 8.2.  In regards to patient's AKI responded well to IV fluids and latest creatinine 1.00.  Patient's urinary retention continued to improve Foley catheter tube removed.  Urine culture did grow 80,000 yeast received 14 days of Diflucan.  Uncontrolled diabetes mellitus hemoglobin A1c 12.1 insulin therapy as directed.  She did have bouts of hypotension and antihypertensive medications were adjusted. Patient transferred to CIR on 03/17/2021 .   Patient currently requires min with mobility secondary to muscle weakness, joint tightness, decreased cardiorespiratory endurance, decreased balance, decreased postural control and balance strategies.   Prior to hospitalization, patient was unsafelymodified independent  with mobility and lived with Spouse, Daughter in a [address].  Home access is  threshold entry, or a Level entry requiring further ambulation around house to kitchen door, which is pt's choice.   Patient will benefit from skilled PT intervention to maximize safe functional mobility, minimize fall risk, and decrease caregiver burden for planned discharge home with intermittent assist.  Anticipate patient will benefit from follow up Jennifer [name] at discharge.  PT - End of Session Activity Tolerance: Tolerates 30+ min activity with multiple rests Endurance Deficit: Yes Endurance Deficit Description: fatigues after ambulating x 45' PT Assessment Rehab Potential (ACUTE/IP  ONLY): Good PT Patient demonstrates impairments in the following area(s): Balance;Edema;Endurance;Safety;Motor;Sensory PT Transfers Functional Problem(s): Bed Mobility;Bed to Chair;Car;Furniture PT Locomotion Functional Problem(s): Ambulation;Wheelchair Mobility PT Plan PT Intensity: Minimum of 1-2 x/day ,45 to 90 minutes PT Frequency: 5 out of 7 days PT Duration Estimated Length of Stay: 14-17 days PT Treatment/Interventions: Ambulation/gait training;Discharge planning;DME/adaptive equipment instruction;Functional mobility training;Pain management;Psychosocial support;Splinting/orthotics;Therapeutic Activities;UE/LE Strength taining/ROM;Balance/vestibular training;Community reintegration;Neuromuscular re-education;Patient/family education;Stair training;Therapeutic Exercise;UE/LE Coordination activities;Wheelchair propulsion/positioning PT Transfers Anticipated Outcome(s): basic and furniture mod I PT Locomotion Anticipated Outcome(s): S LRAD gait x 150' controlled and community environments; mod I household gait x 50'; S wc x 150' controlled environment ; 50' home environment PT Recommendation Follow Up Recommendations: Home health PT Patient destination: Home Equipment Recommended: To be determined Equipment Details: apparently owns a RW wiht 2 wheels   PT Evaluation Precautions/Restrictions Precautions Precautions: Fall (bil foot drop; need interpreter) Restrictions Weight Bearing Restrictions: No General   Vital Signs Pain- a '' little bit " of abdominal pain, due to GI problems; medicated for diarrhea   Pain Interference- does not interfere with sleep, ADLs or therapy   Home Living/Prior Functioning Home Living Available Help at Discharge: Family;Available PRN/intermittently Type of Home: Apartment Home Access: Level entry Home Layout: One level Bathroom Shower/Tub: Chiropodist: Handicapped height Bathroom Accessibility: Yes  Lives With:  Spouse;Daughter Prior Function Level of Independence: Requires assistive device for independence;Needs assistance with ADLs;Independent with gait;Independent with transfers (Mod I, however has had multiple falls recently)  Able to Take Stairs?: No Driving: No Vision/Perception - blind in L eye x 6 years.  Pt tended to veer L when propelling wc and when ambulating. Pt reported no  recent changes in vision.  See OT eval for further detail.    Cognition Overall Cognitive Status: Within Functional Limits for tasks assessed Arousal/Alertness: Awake/alert Year: 2022 Month: November Day of Week: Correct Attention: Focused;Sustained;Selective Focused Attention: Appears intact Sustained Attention: Appears intact Selective Attention: Appears intact Memory: Appears intact Immediate Memory Recall: Sock;Blue;Bed Memory Recall Sock: Without Cue Memory Recall Blue: Without Cue Memory Recall Bed: Without Cue Awareness: Appears intact Problem Solving: Appears intact Safety/Judgment: Impaired (pt has hx of falls) Sensation Sensation Light Touch: Impaired by gross assessment (absent L toes and anterior surface bil LL and dorsum of feet) Proprioception: Appears Intact (tested at ankles) Coordination Gross Motor Movements are Fluid and Coordinated: No Heel Shin Test: unable due to hip weakness bil Motor  Motor Motor: Within Functional Limits Motor - Skilled Clinical Observations: generalized weakness   Trunk/Postural Assessment  Cervical Assessment Cervical Assessment: Exceptions to [name] (slight forward head) Thoracic Assessment Thoracic Assessment: Exceptions to [name] (rounded shoulders) Lumbar Assessment Lumbar Assessment: Exceptions to WFL (slight posterior pelvic tilt) Postural Control Postural Control: Deficits on evaluation (delayed right reactions)  Balance Balance Balance Assessed: Yes Static Sitting Balance Static Sitting - Balance Support: No upper extremity supported;Feet  supported Static Sitting - Level of Assistance: 5: Stand by assistance Dynamic Sitting Balance Dynamic Sitting - Balance Support: No upper extremity supported;Feet supported Dynamic Sitting - Level of Assistance: 4: Min Insurance risk surveyor Standing - Balance Support: Bilateral upper extremity supported Static Standing - Level of Assistance: 4: Min assist Dynamic Standing Balance Dynamic Standing - Balance Support: Bilateral upper extremity supported;During functional activity Dynamic Standing - Level of Assistance: 3: Mod assist Extremity Assessment      RLE Assessment RLE Assessment: Exceptions to WFL (moderate edema foot and ankle, non pitting) Passive Range of Motion (PROM) Comments: - 10 degrees ankle DF General Strength Comments: grossly in sitting: hip flexion 2+/5, hip abduction 4/5, knee extension 4+/5, ankle DF 1+/5 (mostly toe extension) LLE Assessment LLE Assessment: Exceptions to [name] (foot and ankle with moderate edema, non pitting) Passive Range of Motion (PROM) Comments: -15 degrees ankle DF, approx General Strength Comments: grossly in sitting: hip flexion 2+/5, hip abduction 4/5, knee extension 4+/5, knee flexion 4-/5, ankle DF 2-/5 (mostly toe extension)  Care Tool Care Tool Bed Mobility Roll left and right activity   Roll left and right assist level: Minimal Assistance - Patient > 75%    Sit to lying activity   Sit to lying assist level: Minimal Assistance - Patient > 75%    Lying to sitting on side of bed activity   Lying to sitting on side of bed assist level: the ability to move from lying on the back to sitting on the side of the bed with no back support.: Minimal Assistance - Patient > 75%     Care Tool Transfers Sit to stand transfer   Sit to stand assist level: Minimal Assistance - Patient > 75%    Chair/bed transfer   Chair/bed transfer assist level: Minimal Assistance - Patient > 75%     Educational psychologist transfer assist level: Minimal Assistance - Patient > 75%      Care Tool Locomotion Ambulation   Assist level: Minimal Assistance - Patient > 75% Assistive device: Walker-rolling Max distance: 45  Walk 10 feet activity   Assist level: Minimal Assistance - Patient > 75% Assistive device: Walker-rolling   Walk 50 feet with 2  turns activity Walk 50 feet with 2 turns activity did not occur: Safety/medical concerns (fatigued)      Walk 150 feet activity Walk 150 feet activity did not occur: Safety/medical concerns (fatigue)      Walk 10 feet on uneven surfaces activity Walk 10 feet on uneven surfaces activity did not occur: N/A      Stairs Stair activity did not occur: N/A        Walk up/down 1 step activity        Walk up/down 4 steps activity        Walk up/down 12 steps activity        Pick up small objects from floor Pick up small object from the floor (from standing position) activity did not occur: N/A      Wheelchair Is the patient using a wheelchair?: Yes Type of Wheelchair: Manual   Wheelchair assist level: Minimal Assistance - Patient > 75% Max wheelchair distance: 150  Wheel 50 feet with 2 turns activity   Assist Level: Minimal Assistance - Patient > 75%  Wheel 150 feet activity   Assist Level: Minimal Assistance - Patient > 75%    Refer to Care Plan for Long Term Goals  SHORT TERM GOAL WEEK 1 PT Short Term Goal 1 (Week 1): pt will transfer bed>< wc with CGA PT Short Term Goal 2 (Week 1): pt will propel wc x 150' with supervision and cues for steering on level tile PT Short Term Goal 3 (Week 1): pt will move sit>< stand iwht CGA PT Short Term Goal 4 (Week 1): pt will perform gait with RW on level tile x 100', CGA PT Short Term Goal 5 (Week 1): pt will be assessed for bil AFOs  Recommendations for other services: None   Skilled Therapeutic Intervention Interpreter of Arabic present; PT questions whether pt understood everything interpreter said.   Pt's husband and dtr present.  Pt participated to the best of her ability.  She has a complex medical hx and a hx of falls with injury.  Therapeutic exercises performed with LEs to increase strength for functional mobility; seated 20 x 1 bil ankle pumps, R/L long arc quad knee extensions.  Sustained stretch bil soleus muscles, seated with knees flexed and feet on blue wedge, x 2 minutes (heels not flat on wedge.) Pt reported that she sometimes falls because of dizziness, and sometimes falls because of RLE. She is unable to get up from the floor.  At end of session, pt seated in wc iwht needs at hand and seat belt alarm set.  Family present. Mobility Bed Mobility Bed Mobility: Rolling Right;Rolling Left;Sit to Supine;Supine to Sit (flat bed, no rails) Rolling Right: Minimal Assistance - Patient > 75% Rolling Left: Minimal Assistance - Patient > 75% Supine to Sit: Minimal Assistance - Patient > 75% Sit to Supine: Minimal Assistance - Patient > 75% Transfers Transfers: Sit to Stand;Stand to Sit;Stand Pivot Transfers Sit to Stand: Minimal Assistance - Patient > 75% Stand to Sit: Minimal Assistance - Patient > 75% Stand Pivot Transfers: Minimal Assistance - Patient > 75% Stand Pivot Transfer Details: Verbal cues for technique;Verbal cues for safe use of DME/AE Stand Pivot Transfer Details (indicate cue type and reason): pt attempting to pull on RW Transfer (Assistive device): Rolling walker Locomotion  Gait Ambulation: Yes Gait Assistance: Minimal Assistance - Patient > 75% Gait Distance (Feet): 45 Feet Assistive device: Rolling walker Gait Assistance Details: Verbal cues for technique;Verbal cues for precautions/safety Gait Gait: Yes Gait  Pattern: Impaired Gait Pattern: Step-through pattern;Decreased hip/knee flexion - left;Decreased dorsiflexion - right;Decreased dorsiflexion - left;Decreased hip/knee flexion - right;Right foot flat;Trunk flexed;Wide base of support (R hip externally  rotated) Stairs / Additional Locomotion Stairs: No Wheelchair Mobility Wheelchair Mobility: Yes Wheelchair Assistance: Minimal assistance - Patient >75% Wheelchair Propulsion: Right upper extremity Wheelchair Parts Management: Needs assistance Distance: 150   Discharge Criteria: Patient will be discharged from PT if patient refuses treatment 3 consecutive times without medical reason, if treatment goals not met, if there is a change in medical status, if patient makes no progress towards goals or if patient is discharged from [name].  The above assessment, treatment plan, treatment alternatives and goals were discussed and mutually agreed upon: by patient and by family  Jaleena Viviani 03/18/2021, 5:58 PM

## 2021-03-18 NOTE — Progress Notes (Signed)
Patient up attending therapy, C/O ABD pain. PRN Tylenol given as ordered. Upon 45 minute recheck patient states no pian. Ambulated to the bathroom with assistance. Appetite good, encouraged fluids.  Call light and personal items within reach, safety maintained.

## 2021-03-19 DIAGNOSIS — R5381 Other malaise: Secondary | ICD-10-CM | POA: Diagnosis not present

## 2021-03-19 LAB — CBC WITH DIFFERENTIAL/PLATELET
Abs Immature Granulocytes: 0.01 10*3/uL (ref 0.00–0.07)
Basophils Absolute: 0 10*3/uL (ref 0.0–0.1)
Basophils Relative: 1 %
Eosinophils Absolute: 0.2 10*3/uL (ref 0.0–0.5)
Eosinophils Relative: 4 %
HCT: 24.6 % — ABNORMAL LOW (ref 36.0–46.0)
Hemoglobin: 7.5 g/dL — ABNORMAL LOW (ref 12.0–15.0)
Immature Granulocytes: 0 %
Lymphocytes Relative: 41 %
Lymphs Abs: 1.7 10*3/uL (ref 0.7–4.0)
MCH: 27.4 pg (ref 26.0–34.0)
MCHC: 30.5 g/dL (ref 30.0–36.0)
MCV: 89.8 fL (ref 80.0–100.0)
Monocytes Absolute: 0.3 10*3/uL (ref 0.1–1.0)
Monocytes Relative: 8 %
Neutro Abs: 2 10*3/uL (ref 1.7–7.7)
Neutrophils Relative %: 46 %
Platelets: 138 10*3/uL — ABNORMAL LOW (ref 150–400)
RBC: 2.74 MIL/uL — ABNORMAL LOW (ref 3.87–5.11)
RDW: 12.9 % (ref 11.5–15.5)
WBC: 4.2 10*3/uL (ref 4.0–10.5)
nRBC: 0 % (ref 0.0–0.2)

## 2021-03-19 LAB — COMPREHENSIVE METABOLIC PANEL
ALT: 22 U/L (ref 0–44)
AST: 23 U/L (ref 15–41)
Albumin: 2.2 g/dL — ABNORMAL LOW (ref 3.5–5.0)
Alkaline Phosphatase: 101 U/L (ref 38–126)
Anion gap: 4 — ABNORMAL LOW (ref 5–15)
BUN: 23 mg/dL — ABNORMAL HIGH (ref 6–20)
CO2: 25 mmol/L (ref 22–32)
Calcium: 8.5 mg/dL — ABNORMAL LOW (ref 8.9–10.3)
Chloride: 111 mmol/L (ref 98–111)
Creatinine, Ser: 1.04 mg/dL — ABNORMAL HIGH (ref 0.44–1.00)
GFR, Estimated: >60 mL/min (ref >60)
Glucose, Bld: 96 mg/dL (ref 70–99)
Potassium: 4.3 mmol/L (ref 3.5–5.1)
Sodium: 140 mmol/L (ref 135–145)
Total Bilirubin: 0.4 mg/dL (ref 0.3–1.2)
Total Protein: 5.3 g/dL — ABNORMAL LOW (ref 6.5–8.1)

## 2021-03-19 LAB — GLUCOSE, CAPILLARY
Glucose-Capillary: 103 mg/dL — ABNORMAL HIGH (ref 70–99)
Glucose-Capillary: 91 mg/dL (ref 70–99)
Glucose-Capillary: 122 mg/dL — ABNORMAL HIGH (ref 70–99)
Glucose-Capillary: 129 mg/dL — ABNORMAL HIGH (ref 70–99)

## 2021-03-19 LAB — MAGNESIUM: Magnesium: 1.9 mg/dL (ref 1.7–2.4)

## 2021-03-19 MED ORDER — LOPERAMIDE HCL 2 MG PO CAPS
2.0000 mg | ORAL_CAPSULE | ORAL | Status: DC | PRN
  Administered 2021-03-19: 01:00:00 2 mg via ORAL
  Filled 2021-03-19: qty 1

## 2021-03-19 NOTE — Progress Notes (Signed)
Inpatient Rehabilitation  Patient information reviewed and entered into eRehab system by Samone Guhl Nanea Jared, OTR/L.   Information including medical coding, functional ability and quality indicators will be reviewed and updated through discharge.    

## 2021-03-19 NOTE — Progress Notes (Signed)
PROGRESS NOTE   Subjective/Complaints: No issues overnight , but c/o 7 BM yesterday , pt states she did not see blood  ROS: +Bilateral lower extremity weakness   Objective:   DG Shoulder Left  Result Date: 03/18/2021 CLINICAL DATA:  Pain. EXAM: LEFT SHOULDER - 2+ VIEW COMPARISON:  Left shoulder x-ray 03/08/2021. FINDINGS: There is no evidence of fracture or dislocation. There are mild degenerative changes of the glenohumeral joint with osteophyte formation. Joint spaces are maintained. Soft tissues are unremarkable. IMPRESSION: 1. No acute bony abnormality. 2. Mild degenerative changes. Electronically Signed   By: Ronney Asters M.D.   On: 03/18/2021 16:20   Recent Labs    03/17/21 1108 03/19/21 0703  WBC 8.3 4.2  HGB 8.7* 7.5*  HCT 28.2* 24.6*  PLT 142* 138*    Recent Labs    03/17/21 1108 03/19/21 0703  NA 137 140  K 4.3 4.3  CL 107 111  CO2 23 25  GLUCOSE 141* 96  BUN 30* 23*  CREATININE 0.96 1.04*  CALCIUM 8.4* 8.5*     Intake/Output Summary (Last 24 hours) at 03/19/2021 0950 Last data filed at 03/18/2021 1306 Gross per 24 hour  Intake 240 ml  Output --  Net 240 ml         Physical Exam: Vital Signs Blood pressure 124/60, pulse 80, temperature 98.7 F (37.1 C), temperature source Oral, resp. rate 18, height 5\' 6"  (1.676 m), weight 76.1 kg, last menstrual period 08/21/2016, SpO2 98 %.  General: No acute distress Mood and affect are appropriate Heart: Regular rate and rhythm no rubs murmurs or extra sounds Lungs: Clear to auscultation, breathing unlabored, no rales or wheezes Abdomen: Positive bowel sounds, soft nontender to palpation, nondistended Extremities: No clubbing, cyanosis, or edema Skin: No evidence of breakdown, no evidence of rash Neurologic: Cranial nerves II through XII intact, motor strength is 4/5 in bilateral deltoid, bicep, tricep, grip, hip flexor, knee extensors, ankle  dorsiflexor and plantar flexor  Cerebellar exam normal finger to nose to finger as well as heel to shin in bilateral upper and lower extremities Musculoskeletal: Full range of motion in all 4 extremities. No joint swelling    Assessment/Plan: 1. Functional deficits which require 3+ hours per day of interdisciplinary therapy in a comprehensive inpatient rehab setting. Physiatrist is providing close team supervision and 24 hour management of active medical problems listed below. Physiatrist and rehab team continue to assess barriers to discharge/monitor patient progress toward functional and medical goals  Care Tool:  Bathing              Bathing assist Assist Level: Minimal Assistance - Patient > 75%     Upper Body Dressing/Undressing Upper body dressing        Upper body assist Assist Level: Supervision/Verbal cueing    Lower Body Dressing/Undressing Lower body dressing            Lower body assist Assist for lower body dressing: Minimal Assistance - Patient > 75%     Toileting Toileting    Toileting assist Assist for toileting: Minimal Assistance - Patient > 75%     Transfers Chair/bed transfer  Transfers assist  Chair/bed transfer assist level: Minimal Assistance - Patient > 75%     Locomotion Ambulation   Ambulation assist      Assist level: Minimal Assistance - Patient > 75% Assistive device: Walker-rolling Max distance: 45   Walk 10 feet activity   Assist     Assist level: Minimal Assistance - Patient > 75% Assistive device: Walker-rolling   Walk 50 feet activity   Assist Walk 50 feet with 2 turns activity did not occur: Safety/medical concerns (fatigued)         Walk 150 feet activity   Assist Walk 150 feet activity did not occur: Safety/medical concerns (fatigue)         Walk 10 feet on uneven surface  activity   Assist Walk 10 feet on uneven surfaces activity did not occur: N/A          Wheelchair     Assist Is the patient using a wheelchair?: Yes Type of Wheelchair: Manual    Wheelchair assist level: Minimal Assistance - Patient > 75% Max wheelchair distance: 150    Wheelchair 50 feet with 2 turns activity    Assist        Assist Level: Minimal Assistance - Patient > 75%   Wheelchair 150 feet activity     Assist      Assist Level: Minimal Assistance - Patient > 75%   Blood pressure 124/60, pulse 80, temperature 98.7 F (37.1 C), temperature source Oral, resp. rate 18, height 5\' 6"  (1.676 m), weight 76.1 kg, last menstrual period 08/21/2016, SpO2 98 %.  Medical Problem List and Plan: 1.  Debility functional deficits secondary to proctitis/colitis/GI bleed/multi medical             -patient may shower             -ELOS/Goals: 5-7 days modI  Continue CIR 2.  Impaired mobility: added SCDs at night as per daughter's request.  -DVT/anticoagulation:  Pharmaceutical: Other (comment) Eliquis             -antiplatelet therapy: N/A 3. Left shoulder pain and limited range of motion: Shoulder XR ordered. Neurontin 300 mg nightly 4. Mood: Provide emotional support             -antipsychotic agents: N/A 5. Neuropsych: This patient is capable of making decisions on her own behalf. 6. Skin/Wound Care: Routine skin checks 7. Fluids/Electrolytes/Nutrition: Routine in and outs with follow-up chemistries 8.  Proctitis/colitis/GI bleed.  Follow-up GI Dr. Paulita Fujita.  Completed course of Omnicef and Flagyl.  Follow-up CBC, given drop in Hgb will repeat Stool OB, recheck CBC in am and get GI input regarding anticoagulation  9.  Prior CVA/abnormal MRI.  Recent admission 02/25/2021 to 03/05/2021.  Plan repeat MRI 4 weeks per neurology services 10.  Atrial fibrillation.  Cardiac rate controlled.  Continue Eliquis. Check magnesium level Monday morning. 11.  Hypertension.  Norvasc 2.5 mg twice daily.  Monitor with increased mobility 12.  Uncontrolled diabetes mellitus.   Hemoglobin A1c greater than 12.  Levemir 18 units nightly.  Check blood sugars before meals and at bedtime.  Diabetic teaching CBG (last 3)  Recent Labs    03/18/21 1619 03/18/21 2058 03/19/21 0621  GLUCAP 147* 170* 103*  Controlled 11/28 cont Levimir 15U qhs Metformin XR 100mg  BID - will hold due to freq loose stools   13.  AKI versus CKD.  Follow-up chemistries 14.  Glaucoma.  Left eye blindness.  Follow Dr. Catalina [facility].  Continue eyedrops. She asked  that Bromidine be discontinued due to burning. I have done so, and asked husband to call Dr. Manuella Ghazi to scheduled outpatient ophthalmology appointment for soon after she leaves here. Messaged Neoma Laming to help get husband permanent handicap placard on Monday.  15.  Urinary retention.  Foley tube removed.  Follow-up outpatient urology as needed 16.  Medical noncompliance.  Counseling 17. Overweight BMI 27.08: provide dietary education.  18. Bilateral lower extremity edema: recommended low sodium diet  19.  Diarrhea, hold metformin   LOS: 2 days A FACE TO FACE EVALUATION WAS PERFORMED  Charlett Blake 03/19/2021, 9:50 AM

## 2021-03-19 NOTE — Progress Notes (Signed)
Inpatient [facility] Individual Statement of Services  Patient Name:  Jennifer [name]  Date:  03/19/2021  Welcome to the [facility].  Our goal is to provide you with an individualized program based on your diagnosis and situation, designed to meet your specific needs.  With this comprehensive rehabilitation program, you will be expected to participate in at least 3 hours of rehabilitation therapies Monday-Friday, with modified therapy programming on the weekends.  Your rehabilitation program will include the following services:  Physical Therapy (PT), Occupational Therapy (OT), Speech Therapy (ST), 24 hour per day rehabilitation nursing, Therapeutic Recreaction (TR), Neuropsychology, Care Coordinator, Rehabilitation Medicine, Nutrition Services, and Pharmacy Services  Weekly team conferences will be held on Wednesday to discuss your progress.  Your Inpatient Rehabilitation Care Coordinator will talk with you frequently to get your input and to update you on team discussions.  Team conferences with you and your family in attendance may also be held.  Expected length of stay: 14-17 days  Overall anticipated outcome: independent to supervision level  Depending on your progress and recovery, your program may change. Your Inpatient Rehabilitation Care Coordinator will coordinate services and will keep you informed of any changes. Your Inpatient Rehabilitation Care Coordinator's name and contact numbers are listed  below.  The following services may also be recommended but are not provided by the [facility]:   [place] will be made to provide these services after discharge if needed.  Arrangements include referral to agencies that provide these services.  Your insurance has been verified to be:  [facility] Your primary doctor is:  Viviano Simas  Pertinent  information will be shared with your doctor and your insurance company.  Inpatient Rehabilitation Care Coordinator:  Ovidio Kin, [place] or Emilia Beck  Information discussed with and copy given to patient by: Elease Hashimoto, 03/19/2021, 11:08 AM

## 2021-03-19 NOTE — Progress Notes (Signed)
[name], female   DOB: 18-Jul-1970, 50 y.o.   MRN: [id number] Met with the patient and spoke w spouse over the phone to introduce self and role. Reviewed information on HTN, DM management and handouts given to patient ( in Arabic) for reference. Continue to follow along to discharge to address educational needs and facilitate preparation for discharge. Hervey Ard, Dalbert Batman

## 2021-03-19 NOTE — Progress Notes (Signed)
Occupational Therapy Session Note  Patient Details  Name: Jennifer [name] MRN: [id number] Date of Birth: [birth year]  Today's Date: 03/19/2021 OT Individual Time: 1005-1105 OT Individual Time Calculation (min): 60 min    Short Term Goals: Week 1:  OT Short Term Goal 1 (Week 1): Pt will complete toileting with supervision OT Short Term Goal 2 (Week 1): Pt will complete stand pivot to [facility] with CGA OT Short Term Goal 3 (Week 1): Pt will dress LB with supervision OT Short Term Goal 4 (Week 1): Pt will bathe UB/LB with CGA  Skilled Therapeutic Interventions/Progress Updates:    Pt semi reclined in bed, pt speaking small amount of english and appearing to understand and respond appropriately to therapist.  When therapist asked her if she had any pain she responded yesterday her pain was a 7/10 pain but no pain today.  Politely refusing showering but agreeable to changing clothes and then going to gym to work on strength and endurance.  Supine to sit with supervision using bed rail.  Pt donned shirt with setup and pants with min assist needing cues to correct error of donning backwards.  Pt having difficulty maintaining figure 4 position needing manual support to LE.  When standing right foot significantly externally rotated needing manual positioning to reduce rotation.  Stand pivot to w/c using RW with max multimodal cues for safe management due to pt stepping outside frame of walker and overall min assist to maintain balance. Pt self propelled w/c using BUE to gym approximately 75 feet to increase strength and endurance.  Pt heavily favoring RUE and needing max multimodal cues and min assist to propel through hallway due to continuously veering to left.  Stand pivot to nustep without AD needing min assist and completed x 8 minutes on workload 4 to promote BUE/BLE strength and overall improved endurance.  Stand pivot to w/c with min assist without AD.  Pt completed biceps curls using 5lb dowel and triceps  push ups in w/c 3 x 15 reps each.  Transported back to room for time mgt. Squat pivot to EOB with min assist.  Sit to supine with CGA.  Call bell in reach, bed alarm on.  Therapy Documentation Precautions:  Precautions Precautions: Fall (bil foot drop; need interpreter) Restrictions Weight Bearing Restrictions: No    Therapy/Group: Individual Therapy  Ezekiel Slocumb 03/19/2021, 1:22 PM

## 2021-03-19 NOTE — Progress Notes (Signed)
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   Annotation: secondary to uveitis of unknown etiology Qualifier: Diagnosis of  By: Hilma Favors  DO, Beth     Hair loss    History of fracture of clavicle 05/18/2015   Hyperlipidemia    Hypertension    Hypertension associated with diabetes ([place]) 07/23/2016   Iron deficiency anemia 05/13/2013   Left anterior shoulder pain 07/06/2017   Pap smear abnormality of cervix with LGSIL    Routine/ritual circumcision    Type II diabetes mellitus ([place])    Uveitis    Past Surgical History:  Past Surgical History:  Procedure Laterality Date   CESAREAN SECTION     x 1   EYE SURGERY Bilateral    Lasik   LEFT HEART CATH AND CORONARY ANGIOGRAPHY N/A 02/05/2021   Procedure: LEFT HEART CATH AND CORONARY ANGIOGRAPHY;  Surgeon: Dixie Dials, MD;  Location: [place] [facility];  Service: Cardiovascular;  Laterality: N/A;   MULTIPLE TOOTH EXTRACTIONS     bottom denture   ORIF ANKLE FRACTURE Right 05/05/2020   Procedure: OPEN REDUCTION INTERNAL FIXATION (ORIF) ANKLE FRACTURE;  Surgeon: Shona Needles, MD;  Location: [place];  Service: Orthopedics;  Laterality: Right;   Social History:  reports that she has never smoked. She has never used smokeless tobacco. She reports that she does not drink alcohol and does not use drugs.  Family / Support Systems Marital Status: Married Patient Roles: Spouse,  Parent Spouse/Significant Other: Ahmed [phone] Children: Leona Singleton [phone]  Barb Merino [phone] Other Supports: Other family members Anticipated Caregiver: Runell Gess and [name] Ability/Limitations of Caregiver: Both work and daughter goes to school-college Caregiver Availability: Other (Comment) (almost 24/7 will be times alone) Family Dynamics: Close knit with family who have been assisting her prior to admission. She has gotten weaker now and requires more assist. All plan to pull together to assist her at DC  Social History Preferred language: Arabic Religion: None Cultural Background: Arabic limited English Education: [place] - How often do you need to have someone help you when you read instructions, pamphlets, or other written material from your doctor or pharmacy?: Sometimes Writes: Yes (native language) Employment Status: Disabled Public relations account executive Issues: No issues Guardian/Conservator: None-according to MD pt is capable of making her own decisions while here   Abuse/Neglect Abuse/Neglect Assessment Can Be Completed: Yes Physical Abuse: Denies Verbal Abuse: Denies Sexual Abuse: Denies Exploitation of patient/patient's resources: Denies Self-Neglect: Denies  Patient response to: Social Isolation - How often do you feel lonely or isolated from those around you?: Never  Emotional Status Pt's affect, behavior and adjustment status: Pt is motivated to do well nd hopefully improve she wants to get back to her independent level she was at prior to all of these health issues in Feb. Her husband is present and hopes she does well here. Recent Psychosocial Issues: other health issues-multiple hospitalizations since Jan 2022 Psychiatric History: No history may benefit from seeing neuro-psych while here due  to medical issues and multiple hospital admissions Substance Abuse History: No issues  Patient / Family Perceptions, Expectations & Goals Pt/Family  understanding of illness & functional limitations: Pt and family are aware of her treatment plan going forward they talk with the MD when here and have an interpreter present. Both want her to do well here and get stronger to be able to be alone some at home Premorbid pt/family roles/activities: Mom, wife, church member, etc Anticipated changes in roles/activities/participation: resume Pt/family expectations/goals: Pt states: " I hope to do well here and make progress."  Husband states: " I hope she can do well and be home and not have to come back to the hospital."  [employer]: Other (Comment) (had in past) Premorbid Home Care/DME Agencies: Other (Comment) (rw, wc, bsc) Transportation available at discharge: family members Is the patient able to respond to transportation needs?: Yes In the past 12 months, has lack of transportation kept you from medical appointments or from getting medications?: No In the past 12 months, has lack of transportation kept you from meetings, work, or from getting things needed for daily living?: No Resource referrals recommended: Neuropsychology  Discharge Planning Living Arrangements: Spouse/significant other, Children Support Systems: Spouse/significant other, Children, Other relatives, Friends/neighbors, Church/faith community Type of Residence: Private residence Insurance Resources: [employer] (specify county) Museum/gallery curator Resources: Family Support Financial Screen Referred: No Living Expenses: Education officer, community Management: Spouse Does the patient have any problems obtaining your medications?: No Home Management: Family members Patient/Family Preliminary Plans: Return home with huband and children who will piece together as much care as possible-will be time she will be alone during the day when husband works and daughter is in school. Will await team's evaluations and work on safe discharge plan. Care Coordinator Barriers to Discharge:  Medication compliance, Decreased caregiver support, Insurance for SNF coverage Care Coordinator Anticipated Follow Up Needs: HH/OP  Clinical Impression Pleasant female who is motivated to do well and recover and be as independent as possible before going home. Her husband and family are involved and have assisted her prior to admission but can not provide 24/7 care due to work and school. Will work on safe discharge plan. Pt does require a interpreter due to her English is limited. Interpretor present in her room when this worker met with her along with husband  Elease Hashimoto 03/19/2021, 11:06 AM

## 2021-03-19 NOTE — Evaluation (Signed)
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   Annotation: secondary to uveitis of unknown etiology Qualifier: Diagnosis of  By: Hilma Favors  DO, Beth     Hair loss    History of fracture of clavicle 05/18/2015   Hyperlipidemia    Hypertension    Hypertension associated with diabetes ([place]) 07/23/2016   Iron deficiency anemia 05/13/2013   Left anterior shoulder pain 07/06/2017   Pap smear abnormality of cervix with LGSIL    Routine/ritual circumcision    Type II diabetes mellitus ([place])    Uveitis    Past Surgical History:  Past Surgical History:  Procedure Laterality Date   CESAREAN SECTION     x 1   EYE SURGERY Bilateral    Lasik   LEFT HEART CATH AND CORONARY ANGIOGRAPHY N/A 02/05/2021   Procedure: LEFT HEART CATH AND CORONARY ANGIOGRAPHY;  Surgeon: Dixie Dials, MD;  Location: [place] [facility];  Service: Cardiovascular;  Laterality: N/A;   MULTIPLE TOOTH EXTRACTIONS     bottom denture   ORIF ANKLE FRACTURE Right 05/05/2020   Procedure: OPEN REDUCTION INTERNAL FIXATION (ORIF) ANKLE FRACTURE;  Surgeon: Shona Needles, MD;  Location: [facility];  Service: Orthopedics;  Laterality: Right;    Assessment / Plan / Recommendation Clinical Impression  Jennifer [name] is a 50 year old right-handed limited English speaking female with complicated medical history of atrial fibrillation  maintained on Eliquis, non-STEMI 10/22, left eye visual impairment/glaucoma, hypertension, hyperlipidemia, diabetes mellitus, COVID-19 06/15/2020, closed trimalleolar right ankle fracture 05/02/2020, medical noncompliance. Recent admission was a long hospital 02/25/2021 to 03/05/2021 for multifocal CVA with concern for infectious/inflammatory etiology as well as possible enhancing occipital lobe lesion and did receive lumbar puncture and ruled out meningitis as well as subarachnoid hemorrhage. MRI of the brain study 02/28/2021 showed prominent diffuse FLAIR hyperintensity within the cerebral sulci representing proteinaceous material related to meningitis or less likely subarachnoid hemorrhage.  Multiple punctate  infarcts bilateral cerebral hemispheres noted and cerebellar hemisphere and an unchanged appearance of 2 enhancing lesions in the left occipital lobe representing possible subacute infarct with hemorrhagic transformation versus malignancy.  Plan was for rescheduling of repeat imaging in approximately 4 weeks with neurology follow-up as there are concerns for severely uncontrolled vascular factors..  Per chart review patient lives with her husband and children.  Uses a rolling walker for mobility.  Required some assist for lower body dressing.  1 level apartment with level entry.  Multiple family members in and out during the day to assist.  Presented 03/06/2021 with rectal bleeding and frank blood clots, CT of the abdomen pelvis showed prominent edematous wall thickening in the rectum with perirectal and presacral edema.  Imaging suggesting infectious/inflammatory proctitis. Uncontrolled diabetes mellitus hemoglobin A1c 12.1 insulin therapy as directed.  She did have bouts of hypotension and antihypertensive medications were adjusted. .  Patient transferred to CIR on 03/17/2021.  Pt participating in Speech/Language/Cognition assessment with live  translator and husband present. Pt administered abbreviated  SLUMS assessment with strengths in orientation and naming, mild deficits in recall and mathematics. Pt reports having a 2nd grade education with baseline memory impairments that are exacerbated by medical issues (blood pressure, blood sugar etc.). Pt able to ID and solve basic problems and awareness and insight into current cognitive and physical impairments appear intake. Pt and husband agree pt is at cognitive baseline at this time, report that pt will be home alone for some amounts of time during the day and they would like someone with her for assistance/supervision.    Skilled Therapeutic Interventions          Pt participating in portions of SLUMS as well as further non-standardized assessments of speech, language and cognition. Please see above.   SLP Assessment  Patient does not need any further Speech Lanaguage Pathology Services    Recommendations  Oral Care Recommendations: Oral care BID Patient destination: Home Follow up Recommendations: 24 hour supervision/assistance          Pain Pain Assessment Pain Scale: 0-10 Pain Score: 0-No pain  Prior Functioning Cognitive/Linguistic Baseline: Baseline deficits Baseline deficit details: memory impairments Type of Home: Apartment  Lives With: Spouse;Daughter Available Help at Discharge: Family;Available PRN/intermittently Education: 2nd grade Vocation: Unemployed  SLP Evaluation Cognition Overall Cognitive Status: History of cognitive impairments - at baseline Arousal/Alertness: Awake/alert Orientation Level: Oriented X4 Year: 2022 Month: November Day of Week: Correct Attention: Focused;Sustained;Selective Focused Attention: Appears intact Sustained Attention: Appears intact Selective Attention: Appears intact Memory: Appears intact Awareness: Appears intact Problem Solving: Appears intact Safety/Judgment: Impaired Comments: falls at home, reports due to low blood sugar  Comprehension Auditory Comprehension Overall  Auditory Comprehension: Appears within functional limits for tasks assessed Expression Expression Primary Mode of Expression: Verbal Verbal Expression Overall Verbal Expression: Appears within functional limits for tasks assessed Oral Motor Oral Motor/Sensory Function Overall Oral Motor/Sensory Function: Mild impairment Facial ROM: Reduced left Facial Symmetry: Abnormal symmetry left  Care Tool Care Tool Cognition Ability to hear (with hearing aid or hearing appliances if normally used Ability to hear (with hearing aid or hearing appliances if normally used): 0. Adequate - no difficulty in normal conservation, social interaction, listening to TV   Expression of Ideas and Wants Expression of Ideas and Wants: 3. Some difficulty - exhibits some difficulty with expressing needs and ideas (e.g, some words or finishing thoughts) or speech is not clear   Understanding Verbal and Non-Verbal Content Understanding Verbal and Non-Verbal Content: 3. Usually understands - understands most conversations, but misses some part/intent of message. Requires cues at times to understand  Memory/Recall Ability Memory/Recall Ability : Current season;Location of own room;Staff names and faces;That he or she is in a hospital/hospital unit   Recommendations for other services: None   Discharge Criteria: Patient will be discharged from SLP if patient refuses treatment 3 consecutive times without medical reason, if treatment goals not met, if there is a change in medical status, if patient makes no progress towards goals or if patient is discharged from hospital.  The above assessment, treatment plan, treatment alternatives and goals were discussed and mutually agreed upon: by patient and by family  Dewaine Conger 03/19/2021, 9:14 AM

## 2021-03-19 NOTE — Progress Notes (Signed)
Physical Therapy Session Note  Patient Details  Name: Jennifer [name] MRN: [id number] Date of Birth: 09-16-1970  Today's Date: 03/19/2021 PT Individual Time: 1300-1356 PT Individual Time Calculation (min): 56 min   Short Term Goals: Week 1:  PT Short Term Goal 1 (Week 1): pt will transfer bed>< wc with CGA PT Short Term Goal 2 (Week 1): pt will propel wc x 150' with supervision and cues for steering on level tile PT Short Term Goal 3 (Week 1): pt will move sit>< stand iwht CGA PT Short Term Goal 4 (Week 1): pt will perform gait with RW on level tile x 100', CGA PT Short Term Goal 5 (Week 1): pt will be assessed for bil AFOs  Skilled Therapeutic Interventions/Progress Updates:     Pt supine in bed on arrival with interpreter at bedside, as pt speaks Arabic. Pt reports no pain but requests to toilet, when asked. Supine<>sit completed with supervision. Sit<>Stand to RW with CGA and ambulates with CGA/minA and RW within her room to bathroom - poor safety awareness with RW, keeping body out of frame and RW too far in front of her. She requests female to supervise pericare, which interpreter agreed to. She was reliant on grab bars within bathroom for balance and transfers to commode. Noted that she locks her knees out in extension while standing, with forward trunk lean as well. Pt transported downstairs to 12M rehab gym in w/c for time and energy conservation. Ace wrapped B LE's due to bilateral foot drop. Pt completed functional gait training, 2x21ft with CGA and RW with VC for safety awareness, RW management, and keeping body within walker frame. Continues to lock knees out in extension during terminal stance with forward flexed trunk, heavy reliance on UE support through RW. She was also instructed in curb training where she required modA for stepping up onto 4inch platform and minA for stepping down, using RW support. Foot drop and hip flexion weakness contributing to difficulty with curbs, unable to  get foot up without external assist. Pt transported back upstairs to her room where she requested to sit EOB at completion of session. Assisted with modA squat<>pivot transfer. Bed alarm on, all needs in reach.  *Extra time needed throughout session for use of interpreter, problem solving home setup, and education on need for shoes in order to provide B AFO for B foot drop.   Therapy Documentation Precautions:  Precautions Precautions: Fall (bil foot drop; need interpreter) Restrictions Weight Bearing Restrictions: No General:    Therapy/Group: Individual Therapy  Alger Simons 03/19/2021, 7:52 AM

## 2021-03-20 DIAGNOSIS — R5381 Other malaise: Secondary | ICD-10-CM | POA: Diagnosis not present

## 2021-03-20 LAB — GLUCOSE, CAPILLARY
Glucose-Capillary: 114 mg/dL — ABNORMAL HIGH (ref 70–99)
Glucose-Capillary: 51 mg/dL — ABNORMAL LOW (ref 70–99)
Glucose-Capillary: 69 mg/dL — ABNORMAL LOW (ref 70–99)
Glucose-Capillary: 141 mg/dL — ABNORMAL HIGH (ref 70–99)
Glucose-Capillary: 127 mg/dL — ABNORMAL HIGH (ref 70–99)
Glucose-Capillary: 147 mg/dL — ABNORMAL HIGH (ref 70–99)
Glucose-Capillary: 203 mg/dL — ABNORMAL HIGH (ref 70–99)

## 2021-03-20 LAB — CBC
HCT: 28.4 % — ABNORMAL LOW (ref 36.0–46.0)
Hemoglobin: 8.8 g/dL — ABNORMAL LOW (ref 12.0–15.0)
MCH: 27.7 pg (ref 26.0–34.0)
MCHC: 31 g/dL (ref 30.0–36.0)
MCV: 89.3 fL (ref 80.0–100.0)
Platelets: 160 10*3/uL (ref 150–400)
RBC: 3.18 MIL/uL — ABNORMAL LOW (ref 3.87–5.11)
RDW: 13 % (ref 11.5–15.5)
WBC: 4.7 10*3/uL (ref 4.0–10.5)
nRBC: 0 % (ref 0.0–0.2)

## 2021-03-20 MED ORDER — MAGNESIUM GLUCONATE 500 MG PO TABS: 250.0000 mg | ORAL_TABLET | Freq: Every day | ORAL | Status: DC

## 2021-03-20 NOTE — Progress Notes (Signed)
Physical Therapy Session Note  Patient Details  Name: Jennifer [name] MRN: [id number] Date of Birth: 11/19/1970  Today's Date: 03/20/2021 PT Individual Time: 6387-5643 PT Individual Time Calculation (min): 72 min   Short Term Goals: Week 1:  PT Short Term Goal 1 (Week 1): pt will transfer bed>< wc with CGA PT Short Term Goal 2 (Week 1): pt will propel wc x 150' with supervision and cues for steering on level tile PT Short Term Goal 3 (Week 1): pt will move sit>< stand iwht CGA PT Short Term Goal 4 (Week 1): pt will perform gait with RW on level tile x 100', CGA PT Short Term Goal 5 (Week 1): pt will be assessed for bil AFOs  Skilled Therapeutic Interventions/Progress Updates:    Patient received reclined in bed, agreeable to PT. She denies pain. Interpretor present partway through session. Patient coming to sit edge of bed with CGA and transfer to wc via squat pivot with MinA. PT transporting patient in wc to therapy gym for time management and energy conservation. NMES applied to B ant tibs for AAROM with overpressure into DF for improved ROM. Patient tolerated well. She ambulated x43ft with RW and CGA/MinA with noted B foot drop, but successful compensation with excessive knee flexion during swing phase, but genu recurvatum in stance for improved stability. Mini squats performed with minimal B UE support for improved quad activation and strength. NuStep x8 mins with 2 rest breaks due to fatigue, B LE only initially, then required use of UE as well due to fatigue. Patient with poor power production in B LE. She returned to room in wc, transferring to bed per patient request with MinA squat pivot. ModA to return supine. Bed alarm on, call light within reach.   Therapy Documentation Precautions:  Precautions Precautions: Fall (bil foot drop; need interpreter) Restrictions Weight Bearing Restrictions: No     Therapy/Group: Individual Therapy  Karoline Caldwell, PT, DPT,  CBIS  03/20/2021, 10:25 AM

## 2021-03-20 NOTE — Progress Notes (Signed)
Occupational Therapy Session Note  Patient Details  Name: Jennifer [name] MRN: [id number] Date of Birth: 11-Oct-1970  Today's Date: 03/20/2021 OT Individual Time: 0900-1000 OT Individual Time Calculation (min): 60 min    Short Term Goals: Week 1:  OT Short Term Goal 1 (Week 1): Pt will complete toileting with supervision OT Short Term Goal 2 (Week 1): Pt will complete stand pivot to [facility] with CGA OT Short Term Goal 3 (Week 1): Pt will dress LB with supervision OT Short Term Goal 4 (Week 1): Pt will bathe UB/LB with CGA  Skilled Therapeutic Interventions/Progress Updates:    Pt semi reclined, no c/o pain, interpreter present for communication needs.  Pt politely refusing self care stating she washed up at the sink at 4 am this morning. Supine to sit with supervision.  Pt attempting to donn shoes however due to swelling in bilateral feet, shoes too tight and unable to fully donn.  Max assist to donn TED hose knee highs.  Pt donned grip socks with CGA.  Squat pivot to w/c with min assist.  Pt self propelled approximately 75 feet with increased time and supervision needing max Vcs due to propelling more with RUE however improved ability to correct errors compared to last session.  Pt completed 3 x 10 reps 5lb free weight elbow flexion and medium resistive band (doubled) triceps press.  Transported to room and direct hand off to PT.    Therapy Documentation Precautions:  Precautions Precautions: Fall (bil foot drop; need interpreter) Restrictions Weight Bearing Restrictions: No    Therapy/Group: Individual Therapy  Ezekiel Slocumb 03/20/2021, 12:41 PM

## 2021-03-20 NOTE — Progress Notes (Signed)
PROGRESS NOTE   Subjective/Complaints: Hypoglycemic this morning- stopped her sliding scale Able to wiggle both toes Son is at bedside She is appreciative of handicap placard.   ROS: +Bilateral lower extremity weakness   Objective:   DG Shoulder Left  Result Date: 03/18/2021 CLINICAL DATA:  Pain. EXAM: LEFT SHOULDER - 2+ VIEW COMPARISON:  Left shoulder x-ray 03/08/2021. FINDINGS: There is no evidence of fracture or dislocation. There are mild degenerative changes of the glenohumeral joint with osteophyte formation. Joint spaces are maintained. Soft tissues are unremarkable. IMPRESSION: 1. No acute bony abnormality. 2. Mild degenerative changes. Electronically Signed   By: Ronney Asters M.D.   On: 03/18/2021 16:20   Recent Labs    03/19/21 0703 03/20/21 0518  WBC 4.2 4.7  HGB 7.5* 8.8*  HCT 24.6* 28.4*  PLT 138* 160   Recent Labs    03/19/21 0703  NA 140  K 4.3  CL 111  CO2 25  GLUCOSE 96  BUN 23*  CREATININE 1.04*  CALCIUM 8.5*    Intake/Output Summary (Last 24 hours) at 03/20/2021 1123 Last data filed at 03/20/2021 0834 Gross per 24 hour  Intake 653 ml  Output --  Net 653 ml        Physical Exam: Vital Signs Blood pressure 140/71, pulse 78, temperature 98.2 F (36.8 C), resp. rate 18, height 5\' 6"  (1.676 m), weight 76.1 kg, last menstrual period 08/21/2016, SpO2 99 %. Gen: no distress, normal appearing HEENT: oral mucosa pink and moist, NCAT Cardio: Reg rate Chest: normal effort, normal rate of breathing Abd: soft, non-distended Ext: no edema Psych: pleasant, normal affect Skin: No evidence of breakdown, no evidence of rash Neurologic: Cranial nerves II through XII intact, motor strength is 4/5 in bilateral deltoid, bicep, tricep, grip, hip flexor, knee extensors, ankle dorsiflexor and plantar flexor  Cerebellar exam normal finger to nose to finger as well as heel to shin in bilateral upper and  lower extremities Musculoskeletal: Full range of motion in all 4 extremities. No joint swelling    Assessment/Plan: 1. Functional deficits which require 3+ hours per day of interdisciplinary therapy in a comprehensive inpatient rehab setting. Physiatrist is providing close team supervision and 24 hour management of active medical problems listed below. Physiatrist and rehab team continue to assess barriers to discharge/monitor patient progress toward functional and medical goals  Care Tool:  Bathing              Bathing assist Assist Level: Minimal Assistance - Patient > 75%     Upper Body Dressing/Undressing Upper body dressing        Upper body assist Assist Level: Supervision/Verbal cueing    Lower Body Dressing/Undressing Lower body dressing            Lower body assist Assist for lower body dressing: Minimal Assistance - Patient > 75%     Toileting Toileting    Toileting assist Assist for toileting: Minimal Assistance - Patient > 75%     Transfers Chair/bed transfer  Transfers assist     Chair/bed transfer assist level: Moderate Assistance - Patient 50 - 74%     Locomotion Ambulation   Ambulation assist  Assist level: Minimal Assistance - Patient > 75% Assistive device: Walker-rolling Max distance: 45   Walk 10 feet activity   Assist     Assist level: Minimal Assistance - Patient > 75% Assistive device: Walker-rolling   Walk 50 feet activity   Assist Walk 50 feet with 2 turns activity did not occur: Safety/medical concerns (fatigued)         Walk 150 feet activity   Assist Walk 150 feet activity did not occur: Safety/medical concerns (fatigue)         Walk 10 feet on uneven surface  activity   Assist Walk 10 feet on uneven surfaces activity did not occur: N/A         Wheelchair     Assist Is the patient using a wheelchair?: Yes Type of Wheelchair: Manual    Wheelchair assist level: Minimal  Assistance - Patient > 75% Max wheelchair distance: 150    Wheelchair 50 feet with 2 turns activity    Assist        Assist Level: Minimal Assistance - Patient > 75%   Wheelchair 150 feet activity     Assist      Assist Level: Minimal Assistance - Patient > 75%   Blood pressure 140/71, pulse 78, temperature 98.2 F (36.8 C), resp. rate 18, height 5\' 6"  (1.676 m), weight 76.1 kg, last menstrual period 08/21/2016, SpO2 99 %.  Medical Problem List and Plan: 1.  Debility functional deficits secondary to proctitis/colitis/GI bleed/multi medical             -patient may shower             -ELOS/Goals: 5-7 days modI  Continue CIR 2.  Impaired mobility: added SCDs at night as per daughter's request.  -DVT/anticoagulation:  Pharmaceutical: Other (comment) Eliquis             -antiplatelet therapy: N/A 3. Left shoulder pain and limited range of motion: Shoulder XR ordered. Continue Neurontin 300 mg nightly 4. Mood: Provide emotional support             -antipsychotic agents: N/A 5. Neuropsych: This patient is capable of making decisions on her own behalf. 6. Skin/Wound Care: Routine skin checks 7. Fluids/Electrolytes/Nutrition: Routine in and outs with follow-up chemistries 8.  Proctitis/colitis/GI bleed.  Follow-up GI Dr. Paulita Fujita.  Completed course of Omnicef and Flagyl.  Follow-up CBC, given drop in Hgb will repeat Stool OB, recheck CBC in am and get GI input regarding anticoagulation  9.  Prior CVA/abnormal MRI.  Recent admission 02/25/2021 to 03/05/2021.  Plan repeat MRI 4 weeks per neurology services 10.  Atrial fibrillation.  Cardiac rate controlled.  Continue Eliquis. Magnesium level 1.9-- will hold off on supplementation right now given her loose stools 11.  Hypertension.  Norvasc 2.5 mg twice daily.  Monitor with increased mobility 12.  Uncontrolled diabetes mellitus.  Hemoglobin A1c greater than 12.  Levemir 18 units nightly.  Check blood sugars before meals and at  bedtime.  Diabetic teaching CBG (last 3)  Recent Labs    03/20/21 0549 03/20/21 0618 03/20/21 1013  GLUCAP 69* 114* 141*  Controlled 11/28 cont Levimir 15U qhs Metformin XR 100mg  BID - will hold due to freq loose stools  D/c ISS  13.  AKI versus CKD.  Placed nursing order to encourage 6-8 glasses of water per day 14.  Glaucoma.  Left eye blindness.  Follow Dr. Catalina Antigua [facility]  Continue eyedrops. She asked that Bromidine be discontinued due to burning.  I have done so, and asked husband to call Dr. Manuella Ghazi to scheduled outpatient ophthalmology appointment for soon after she leaves here. Messaged Neoma Laming to help get husband permanent handicap placard on Monday.  15.  Urinary retention.  Foley tube removed.  Follow-up outpatient urology as needed 16.  Medical noncompliance.  Counseling 17. Overweight BMI 27.08: provide dietary education.  18. Bilateral lower extremity edema: recommended low sodium diet  19.  Diarrhea, hold metformin   LOS: 3 days A FACE TO FACE EVALUATION WAS PERFORMED  Martha Clan P Claudis Giovanelli 03/20/2021, 11:23 AM

## 2021-03-20 NOTE — Progress Notes (Signed)
Physical Therapy Session Note  Patient Details  Name: Jennifer [name] MRN: [id number] Date of Birth: [birth year]  Today's Date: 03/20/2021 PT Individual Time: 1000-1100 PT Individual Time Calculation (min): 60 min   Short Term Goals: Week 1:  PT Short Term Goal 1 (Week 1): pt will transfer bed>< wc with CGA PT Short Term Goal 2 (Week 1): pt will propel wc x 150' with supervision and cues for steering on level tile PT Short Term Goal 3 (Week 1): pt will move sit>< stand iwht CGA PT Short Term Goal 4 (Week 1): pt will perform gait with RW on level tile x 100', CGA PT Short Term Goal 5 (Week 1): pt will be assessed for bil AFOs  Skilled Therapeutic Interventions/Progress Updates:     Pt seen sitting in w/c to start session. Son at bedside as well as in-house interpreter. Pt agreeable with no reports of pain. She requests that nursing assess her blood sugar as she was hypoglycemic this morning. NT arrived with BG reading of 141. Pt appreciative. Family brought in slip-on shoes but per OT from prior session, feet too swollen to fit into shoes, thus, unable to trial bilateral AFOs. Son reports family is bringing tennis shoes this evening.   Pt transported downstairs to 13M rehab gym in w/c for time. Worked on blocked practice squat<>pivot transfers from w/c to mat table, completed x6 total with CGA for safety. Discussed home setup, time alone at home, fall prevention strategies, etc. Engaged in functional gait training with RW and CGA where she ambulated ~11ft + ~173ft. Primary gait deficits include bilateral foot drop, bilateral knees locked out in extension during stance, and forward trunk lean. Pt demo's fatigue after 71ft of gait during 2nd trial, requiring brief standing rest breaks. She does show improved carryover of safety awareness with RW while turning to sitting surfaces.   Pt instructed in standing toe taps to 4inch block and then 1inch block with RW support and CGA/minA for balance. Pt  unable to lift her feet to place onto 4inch block due to foot drop and hip flexion weakness. Able to achieve adequate clearance on 1inch block but pt will need to manage at 4-6inch threshold to enter home. She reports baseline difficulty with this and that she rely's on her husband and son's assistance to navigate threshold.   Pt then participated in dynamic standing balance task with minA for balance while instructed in ball toss to interpreter. Her knees locked out in extension and difficulty allowing slight knee flexion with instances of knee buckling observed. Ball toss in multiple planes to challenge balance and righting reactions - demonstrates appropriate hand/eye coordination - fearful to reach outside BOS. Difficulty with locating ball on L due to L eye blindness.  Pt transported back upstairs to her room for time and energy conservation - remained seated in w/c with safety belt alarm on, all needs in reach.  Therapy Documentation Precautions:  Precautions Precautions: Fall (bil foot drop; need interpreter) Restrictions Weight Bearing Restrictions: No General:    Therapy/Group: Individual Therapy  Khadir Roam P Tiea Manninen 03/20/2021, 7:28 AM

## 2021-03-20 NOTE — Progress Notes (Signed)
Patient stated she felt like her blood sugar may be low this morning. Patient blood sugar at 0530 was 51. Patient was A/O and coherent. Orange juice provided and bedside snack. Blood sugar at 0620 is 114. Breakfast provided at bedside. Will continue to monitor.

## 2021-03-20 NOTE — IPOC Note (Signed)
Overall Plan of Care [facility]) Patient Details Name: Ralyn Stlaurent MRN: [id number] DOB: [birth year]  Admitting Diagnosis: [facility] Problems: Principal Problem:   Debility     Functional Problem List: Nursing Bowel, Medication Management, Endurance, Pain, Safety, Skin Integrity  PT Balance, Edema, Endurance, Safety, Motor, Sensory  OT Balance, Endurance, Motor, Pain, Safety, Sensory, Vision  SLP    TR         Basic ADL's: OT Grooming, Bathing, Toileting, Dressing     Advanced  ADL's: OT       Transfers: PT Bed Mobility, Bed to Chair, Car, Chief Operating Officer: PT Ambulation, Emergency planning/management officer     Additional Impairments: OT Fuctional Use of Upper Extremity  SLP        TR      Anticipated Outcomes Item Anticipated Outcome  Self Feeding    Swallowing      Basic self-care  supervision-mod I  Toileting  mod I   Bathroom Transfers mod I  Bowel/Bladder  mod I assist  Transfers  basic and furniture mod I  Locomotion  S LRAD gait x 150' controlled and community environments; mod I household gait x 50'; S wc x 150' controlled environment ; 50' home environment  Communication     Cognition     Pain  at or below level 4  Safety/Judgment  maintain w cues   Therapy Plan: PT Intensity: Minimum of 1-2 x/day ,45 to 90 minutes PT Frequency: 5 out of 7 days PT Duration Estimated Length of Stay: 14-17 days OT Intensity: Minimum of 1-2 x/day, 45 to 90 minutes OT Frequency: 5 out of 7 days OT Duration/Estimated Length of Stay: 2 weeks     Due to the current state of emergency, patients may not be receiving their 3-hours of Medicare-mandated therapy.   Team Interventions: Nursing Interventions Skin Care/Wound Management, Medication Management, Pain Management, Disease Management/Prevention, Bowel Management, Patient/Family Education  PT interventions Ambulation/gait training, Discharge planning, DME/adaptive equipment instruction,  Functional mobility training, Pain management, Psychosocial support, Splinting/orthotics, Therapeutic Activities, UE/LE Strength taining/ROM, Training and development officer, Community reintegration, Neuromuscular re-education, Barrister's clerk education, IT trainer, Therapeutic Exercise, UE/LE Coordination activities, Wheelchair propulsion/positioning  OT Interventions Training and development officer, Discharge planning, Disease mangement/prevention, DME/adaptive equipment instruction, Functional electrical stimulation, Functional mobility training, Neuromuscular re-education, Pain management, Patient/family education, Psychosocial support, Self Care/advanced ADL retraining, Therapeutic Activities, Therapeutic Exercise, UE/LE Strength taining/ROM, UE/LE Coordination activities, Visual/perceptual remediation/compensation, Wheelchair propulsion/positioning  SLP Interventions    TR Interventions    SW/CM Interventions Discharge Planning, Psychosocial Support, Patient/Family Education   Barriers to Discharge MD  Medical stability  Nursing Decreased caregiver support, New diabetic, Medication compliance 1 level w spouse and daughter  PT      OT Lack of/limited family support (Pt does have dtr and husband living with her; however they report that they work and dtr goes to school and there will be periods of time when patient is by herself at home.)    SLP      SW Medication compliance, Decreased caregiver support, Insurance underwriter for SNF coverage     Team Discharge Planning: Destination: PT-Home ,OT- Home , SLP-Home Projected Follow-up: PT-Home health PT, OT-   , SLP-24 hour supervision/assistance Projected Equipment Needs: PT-To be determined, OT- To be determined, SLP-  Equipment Details: PT-apparently owns a RW wiht 2 wheels, OT-  Patient/family involved in discharge planning: PT- Patient, Family Midwife,  OT-Patient, Family member/caregiver, SLP-Patient, Family member/caregiver  MD ELOS: 5-7  days [place]  Prognosis:  Excellent Assessment: Mrs. Cabal is a 50 year old woman admitted to CIR with debility functional deficits secondary to proctitis/colitis/GI bleed. Medications are being managed, and labs and vitals are being monitored regularly.      See Team Conference Notes for weekly updates to the plan of care

## 2021-03-20 NOTE — Progress Notes (Signed)
Inpatient Diabetes Program Recommendations  AACE/ADA: New Consensus Statement on Inpatient Glycemic Control (2015)  Target Ranges:  Prepandial:   less than 140 mg/dL      Peak postprandial:   less than 180 mg/dL (1-2 hours)      Critically ill patients:  140 - 180 mg/dL   Lab Results  Component Value Date   GLUCAP 127 (H) 03/20/2021   HGBA1C 12.1 (H) 02/03/2021    Review of Glycemic Control  Latest Reference Range & Units 03/20/21 05:33 03/20/21 05:49 03/20/21 06:18 03/20/21 10:13 03/20/21 11:38  Glucose-Capillary 70 - 99 mg/dL 51 (L) 69 (L) 114 (H) 141 (H) 127 (H)  (L): Data is abnormally low (H): Data is abnormally high  Diabetes history: DM2 Outpatient Diabetes medications: Levemir 15 units QD, Novolog 3 units TID, Tradjenta 5 mg qd, Metformin 1000 mg BID Current orders for Inpatient glycemic control: Levemir 15 units QD, Tradjenta 5 mg QD  Inpatient Diabetes Program Recommendations:    Levemir 10 units QHS Novolog 0-9 units TID and 0-5 QHS  Will continue to follow while inpatient.  Thank you, Reche Dixon, RN, BSN Diabetes Coordinator Inpatient Diabetes Program [phone] (team pager from 8a-5p)

## 2021-03-21 ENCOUNTER — Inpatient Hospital Stay (HOSPITAL_COMMUNITY): Payer: Medicaid Other

## 2021-03-21 LAB — GLUCOSE, CAPILLARY
Glucose-Capillary: 55 mg/dL — ABNORMAL LOW (ref 70–99)
Glucose-Capillary: 65 mg/dL — ABNORMAL LOW (ref 70–99)
Glucose-Capillary: 77 mg/dL (ref 70–99)
Glucose-Capillary: 99 mg/dL (ref 70–99)
Glucose-Capillary: 103 mg/dL — ABNORMAL HIGH (ref 70–99)
Glucose-Capillary: 110 mg/dL — ABNORMAL HIGH (ref 70–99)
Glucose-Capillary: 150 mg/dL — ABNORMAL HIGH (ref 70–99)
Glucose-Capillary: 271 mg/dL — ABNORMAL HIGH (ref 70–99)

## 2021-03-21 LAB — OCCULT BLOOD X 1 CARD TO LAB, STOOL: Fecal Occult Bld: POSITIVE — AB

## 2021-03-21 MED ORDER — INSULIN DETEMIR 100 UNIT/ML ~~LOC~~ SOLN
10.0000 [IU] | Freq: Every day | SUBCUTANEOUS | Status: DC
Start: 2021-03-21 — End: 2021-03-23
  Administered 2021-03-21 – 2021-03-22 (×2): 10 [IU] via SUBCUTANEOUS
  Filled 2021-03-21 (×3): qty 0.1

## 2021-03-21 NOTE — Progress Notes (Signed)
Physical Therapy Session Note  Patient Details  Name: Jennifer [name] MRN: [id number] Date of Birth: 12/30/1970  Today's Date: 03/21/2021 PT Individual Time: 1000-1058 + 1300-1415 PT Individual Time Calculation (min): 58 min  + 75 min  Short Term Goals: Week 1:  PT Short Term Goal 1 (Week 1): pt will transfer bed>< wc with CGA PT Short Term Goal 2 (Week 1): pt will propel wc x 150' with supervision and cues for steering on level tile PT Short Term Goal 3 (Week 1): pt will move sit>< stand iwht CGA PT Short Term Goal 4 (Week 1): pt will perform gait with RW on level tile x 100', CGA PT Short Term Goal 5 (Week 1): pt will be assessed for bil AFOs  Skilled Therapeutic Interventions/Progress Updates:     1st session: Pt seen sitting upright in w/c to start session - interpreter also present to assist with communication needs. Pt agreeable to therapy without reports of pain. Family brought her shoes from home. Transported downstairs to 519M rehab gym for time and energy conservation. Retrieved bilateral AFO's but unfortunately, her shoes are too narrow and tight fitting to allow room for AFO; educated her on wide fitting shoes and she reports she will tel her husband. Instruction in functional gait training - ambulated ~150ft with CGA and RW - demonstrates bilateral foot slap, bilateral genu recurvatum in stance, and forward flexed trunk with heavy reliance of UE support through RW. Required x4 brief standing rest breaks due to BLE fatigue. Despite foot drop, doesn't catch toe's in swing due to adequate hip/knee flexion to compensate. Pt assisted on Nustep where she completed 10 minutes in total (x3 rest breaks at 3, 6, and 8 minute mark). Instructed in using BLE's only to start to isolate legs for strengthening. Even on workload of 1, pt unable to achieve adequate power in LE's to complete - she rely's on UE 's to assist for majority of Nustep activity. Required minA for stand<>pivot transfer with RW to  w/c with BLE weakness and buckling noted - poor safety awareness with onset of fatigue requiring reminder for RW management during transfer as she tends to abandon it. Pt then instructed in w/c mobility due to BLE fatigue. She propelled herself ~172ft with supervision in w/c using BUE's - veer's to the R and suspect this may be contributed by L visual deficits (blind in L eye). Pt transported back to her room due to fatigue in w/c. Requesting to remain seated in w/c - safety belt alarm on, all needs in reach.   2nd session: Pt supine in bed to start session - agreeable to PT tx without reports of pain. Interpreter present for communication needs. Pt completed supine<>sit with HOB elevated with supervision. Completed squat/stand pivot transfer with min guard from EOB to w/c - some poor safety awareness during transfer - attempted to transfer prior to w/c being locked. Pt propelled herself to 19M elevators in w/c, ~122ft, with supervision. Required mod cues for hand placement and symmetrical propulsion as she would tend to alternate push's, resulting in veering L<>R. Pt transported remaining distance to 519M rehab gym for time and energy conservation. Applied NMES to ankle DF, bilaterally, using preset small muscle atrophy setting with intensity set to 90 on each side. Trace activation in L>R ankle DF despite VC for AROM during "on" cycle. Instructed in functional gait training - ambulated 2x35ft with CGA and RW - continues to demonstrate similar deficits as outlined above - VC for keeping body within  walker frame and focusing on upright posture. In arm chair - pt instructed in repeated STS while education provided on hand placement to armrests to assist. She was only able to complete x2 sit<>stands prior to fatigue - completed a total of x8 reps. Instructed pt in static standing marching with emphasis on "high knees" but pt lacks adequate hip flexion with only partial ROM. Similar in seated position. Will need to  work on anti-gravity hip flexor strengthening at later date. Completed 3x15 LAQ bilaterally with 3# ankle weight. Pt transported back upstairs to her room in w/c. Remained seated with safety belt alarm on, all needs in reach.  Therapy Documentation Precautions:  Precautions Precautions: Fall (bil foot drop; need interpreter) Restrictions Weight Bearing Restrictions: No General:    Therapy/Group: Individual Therapy  Alger Simons 03/21/2021, 7:34 AM

## 2021-03-21 NOTE — Progress Notes (Signed)
Occupational Therapy Session Note  Patient Details  Name: Jennifer [name] MRN: [id number] Date of Birth: 1970-07-28  Today's Date: 03/21/2021 OT Individual Time: 0905-1000 OT Individual Time Calculation (min): 55 min    Short Term Goals: Week 1:  OT Short Term Goal 1 (Week 1): Pt will complete toileting with supervision OT Short Term Goal 2 (Week 1): Pt will complete stand pivot to [facility] with CGA OT Short Term Goal 3 (Week 1): Pt will dress LB with supervision OT Short Term Goal 4 (Week 1): Pt will bathe UB/LB with CGA  Skilled Therapeutic Interventions/Progress Updates:    Pt semi reclined in bed, interpreter present for assist with communication.  Pt reporting feeling shaky and requesting to have blood sugar tested.  Nurse made aware and blood sugar checked and WNL.  Pt reports after this that she feels better now and requesting to use bathroom.  Supine to sit with supervision.  Squat pivot to w/c with mod assist.  Transported to bathroom and completed stand pivot using grab bars with min assist and max tactile cues for hand placement, weight shifting and manual positioning of right foot.  Pt completed clothing management with mod assist.  Pericare with CGA and lateral leans.  Attempted lateral leans to pull pants and pullup over hips using standard 3 in 1 commode however unable to lean far enough to complete successfully and needing mod assist to perform in standing.  Stand pivot to w/c with min assist and transported to sink where she combed her hair with distant supervision.  Therapist brought bariatric commode with wide base and drop arm in and blocked practice squat pivot w/c<>commode needing min assist and pulling pants and pullups up/down over hips using lateral lean and pt able to complete with close supervision.  Provided medium resistive sponge and instructed on HEP of 5 minutes gross grip x 2 per day to increase strength. Call bell in reach, seat alarm on at end of session.  Therapy  Documentation Precautions:  Precautions Precautions: Fall (bil foot drop; need interpreter) Restrictions Weight Bearing Restrictions: No     Therapy/Group: Individual Therapy  Ezekiel Slocumb 03/21/2021, 3:09 PM

## 2021-03-21 NOTE — Progress Notes (Signed)
Orthopedic Tech Progress Note Patient Details:  Jennifer [name] 01/20/1971 [id number]  Called in order to [employer] for a BILATERAL CUSTOM AFO'S    [name], female   DOB: Oct 22, 1970, 50 y.o.   MRN: [id number]  Jennifer [name] 03/21/2021, 2:12 PM

## 2021-03-21 NOTE — Significant Event (Signed)
Hypoglycemic Event  CBG: 55  Treatment: 4 oz juice/soda  Symptoms: None  Follow-up CBG: UAOU:[id number] CBG Result:77  Possible Reasons for Event: Medication regimen (Levemir)  Comments/MD notified: Pt glucose at 0634 was 55. Pt asymptomatic, hypoglycemic protocol followed. Recheck glucose at 0650 was 77. Izora Ribas, MD made aware.   Jacquelynn Cree Amyrie Illingworth

## 2021-03-21 NOTE — Progress Notes (Signed)
PROGRESS NOTE   Subjective/Complaints: Hypoglycemic this morning. D/c ISS and decreased Levemir to 10U She has no new complaints Feeling more comfortable with bariatric commode  ROS: +Bilateral lower extremity foot drop   Objective:   No results found. Recent Labs    03/19/21 0703 03/20/21 0518  WBC 4.2 4.7  HGB 7.5* 8.8*  HCT 24.6* 28.4*  PLT 138* 160   Recent Labs    03/19/21 0703  NA 140  K 4.3  CL 111  CO2 25  GLUCOSE 96  BUN 23*  CREATININE 1.04*  CALCIUM 8.5*    Intake/Output Summary (Last 24 hours) at 03/21/2021 1124 Last data filed at 03/20/2021 2238 Gross per 24 hour  Intake 478 ml  Output --  Net 478 ml        Physical Exam: Vital Signs Blood pressure 138/77, pulse 74, temperature 98.3 F (36.8 C), temperature source Oral, resp. rate 16, height 5\' 6"  (1.676 m), weight 76.1 kg, last menstrual period 08/21/2016, SpO2 100 %. Gen: no distress, normal appearing HEENT: oral mucosa pink and moist, NCAT Cardio: Reg rate Chest: normal effort, normal rate of breathing Abd: soft, non-distended Ext: no edema Psych: pleasant, normal affect Skin: No evidence of breakdown, no evidence of rash Neurologic: Cranial nerves II through XII intact, motor strength is 4/5 in bilateral deltoid, bicep, tricep, grip, hip flexor, knee extensors, bilateral lower extremity foot drop  Cerebellar exam normal finger to nose to finger as well as heel to shin in bilateral upper and lower extremities Musculoskeletal: Full range of motion in all 4 extremities. No joint swelling    Assessment/Plan: 1. Functional deficits which require 3+ hours per day of interdisciplinary therapy in a comprehensive inpatient rehab setting. Physiatrist is providing close team supervision and 24 hour management of active medical problems listed below. Physiatrist and rehab team continue to assess barriers to discharge/monitor patient  progress toward functional and medical goals  Care Tool:  Bathing              Bathing assist Assist Level: Minimal Assistance - Patient > 75%     Upper Body Dressing/Undressing Upper body dressing   What is the patient wearing?: Pull over shirt    Upper body assist Assist Level: Supervision/Verbal cueing    Lower Body Dressing/Undressing Lower body dressing      What is the patient wearing?: Pants     Lower body assist Assist for lower body dressing: Minimal Assistance - Patient > 75%     Toileting Toileting    Toileting assist Assist for toileting: Minimal Assistance - Patient > 75%     Transfers Chair/bed transfer  Transfers assist     Chair/bed transfer assist level: Moderate Assistance - Patient 50 - 74%     Locomotion Ambulation   Ambulation assist      Assist level: Minimal Assistance - Patient > 75% Assistive device: Walker-rolling Max distance: 45   Walk 10 feet activity   Assist     Assist level: Minimal Assistance - Patient > 75% Assistive device: Walker-rolling   Walk 50 feet activity   Assist Walk 50 feet with 2 turns activity did not occur: Safety/medical concerns (fatigued)  Walk 150 feet activity   Assist Walk 150 feet activity did not occur: Safety/medical concerns (fatigue)         Walk 10 feet on uneven surface  activity   Assist Walk 10 feet on uneven surfaces activity did not occur: N/A         Wheelchair     Assist Is the patient using a wheelchair?: Yes Type of Wheelchair: Manual    Wheelchair assist level: Minimal Assistance - Patient > 75% Max wheelchair distance: 150    Wheelchair 50 feet with 2 turns activity    Assist        Assist Level: Minimal Assistance - Patient > 75%   Wheelchair 150 feet activity     Assist      Assist Level: Minimal Assistance - Patient > 75%   Blood pressure 138/77, pulse 74, temperature 98.3 F (36.8 C), temperature source  Oral, resp. rate 16, height 5\' 6"  (1.676 m), weight 76.1 kg, last menstrual period 08/21/2016, SpO2 100 %.  Medical Problem List and Plan: 1.  Debility functional deficits secondary to proctitis/colitis/GI bleed/multi medical             -patient may shower             -ELOS/Goals: 5-7 days modI  Continue CIR 2.  Impaired mobility: added SCDs at night as per daughter's request.  -DVT/anticoagulation:  Pharmaceutical: Other (comment) Eliquis             -antiplatelet therapy: N/A 3. Left shoulder pain and limited range of motion: Shoulder XR ordered. Continue Neurontin 300 mg nightly 4. Mood: Provide emotional support             -antipsychotic agents: N/A 5. Neuropsych: This patient is capable of making decisions on her own behalf. 6. Skin/Wound Care: Routine skin checks 7. Fluids/Electrolytes/Nutrition: Routine in and outs with follow-up chemistries 8.  Proctitis/colitis/GI bleed.  Follow-up GI Dr. Paulita Fujita.  Completed course of Omnicef and Flagyl.  Follow-up CBC, given drop in Hgb will repeat Stool OB, recheck CBC in am and get GI input regarding anticoagulation  9.  Prior CVA/abnormal MRI.  Recent admission 02/25/2021 to 03/05/2021.  Plan repeat MRI 4 weeks per neurology services 10.  Atrial fibrillation.  Cardiac rate controlled.  Continue Eliquis. Magnesium level 1.9-- will hold off on supplementation right now given her loose stools 11.  Hypertension.  Norvasc 2.5 mg twice daily.  Monitor with increased mobility 12.  Uncontrolled diabetes mellitus.  Hemoglobin A1c greater than 12.  Levemir 18 units nightly.  Check blood sugars before meals and at bedtime.  Diabetic teaching CBG (last 3)  Recent Labs    03/21/21 0725 03/21/21 0728 03/21/21 0912  GLUCAP 65* 99 103*  Decrease Levemir to 10U Metformin XR 100mg  BID - will hold due to freq loose stools  D/c ISS  13.  AKI versus CKD.  Placed nursing order to encourage 6-8 glasses of water per day 14.  Glaucoma.  Left eye blindness.   Follow [facility].  Continue eyedrops. She asked that Bromidine be discontinued due to burning. I have done so, and asked husband to call Dr. Manuella Ghazi to scheduled outpatient ophthalmology appointment for soon after she leaves here. Messaged Neoma Laming to help get husband permanent handicap placard on Monday.  15.  Urinary retention.  Foley tube removed.  Follow-up outpatient urology as needed 16.  Medical noncompliance.  Counseling 17. Overweight BMI 27.08: provide dietary education.  18. Bilateral lower extremity edema:  recommended low sodium diet  19.  Diarrhea, hold metformin   LOS: 4 days A FACE TO FACE EVALUATION WAS PERFORMED  Yaritsa Savarino P Juleen Sorrels 03/21/2021, 11:24 AM

## 2021-03-21 NOTE — Progress Notes (Signed)
[name], female   DOB: October 30, 1970, 50 y.o.   MRN: [id number]  Spoke with daughter-Bashayer and pt to make aware of team conference goals of supervision-mod/I level and discharge 12/14. Daughter feels she needs to be here and make as much progress as she can before coming home. She will look to see what equipment they have at home. She reports they have a garden tub, so can not use tub seat or tub bench. Will let OT know

## 2021-03-22 LAB — GLUCOSE, CAPILLARY
Glucose-Capillary: 73 mg/dL (ref 70–99)
Glucose-Capillary: 89 mg/dL (ref 70–99)
Glucose-Capillary: 274 mg/dL — ABNORMAL HIGH (ref 70–99)
Glucose-Capillary: 304 mg/dL — ABNORMAL HIGH (ref 70–99)

## 2021-03-22 NOTE — Consult Note (Signed)
Reason for Consult:Left shoulder stiffness Referring Physician: Leeroy Cha Time called: 1220 Time at bedside: [place] is an 50 y.o. female.  HPI: Jennifer [name] was admitted to CIR a few days ago 2/2 debilitation brought on by cancer. She has been c/o left shoulder issues. X-rays and MRI only showed some mild OA and orthopedic surgery was asked to consult. She denies pain to me but describes a loss of range of motion of that shoulder since a fall 2 years ago where she broke her clavicle. She was placed in a sling for treatment. She's not sure how long she wore it but did not follow up and has not sought treatment in the meantime. She feels like she already has seen some improvement since beginning to work with PT/OT here. Interpreter used.  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   Annotation: secondary to uveitis of unknown etiology Qualifier: Diagnosis of  By: Hilma Favors  DO, Beth     Hair loss    History of fracture of clavicle 05/18/2015   Hyperlipidemia    Hypertension    Hypertension associated with diabetes ([place]) 07/23/2016   Iron deficiency anemia 05/13/2013   Left anterior shoulder pain 07/06/2017   Pap smear abnormality of cervix with LGSIL    Routine/ritual circumcision    Type II diabetes mellitus ([place])    Uveitis     Past Surgical History:  Procedure Laterality Date   CESAREAN SECTION     x 1   EYE SURGERY Bilateral    Lasik   LEFT HEART CATH AND CORONARY ANGIOGRAPHY N/A 02/05/2021   Procedure: LEFT HEART CATH AND CORONARY ANGIOGRAPHY;  Surgeon: Dixie Dials, MD;  Location: [facility];  Service: Cardiovascular;  Laterality: N/A;   MULTIPLE  TOOTH EXTRACTIONS     bottom denture   ORIF ANKLE FRACTURE Right 05/05/2020   Procedure: OPEN REDUCTION INTERNAL FIXATION (ORIF) ANKLE FRACTURE;  Surgeon: Shona Needles, MD;  Location: [facility];  Service: Orthopedics;  Laterality: Right;    Family History  Problem Relation Age of Onset   Diabetes Father    Hypertension Other     Social History:  reports that she has never smoked. She has never used smokeless tobacco. She reports that she does not drink alcohol and does not use drugs.  Allergies: No Known Allergies  Medications: I have reviewed the patient's current medications.  Results for orders placed or performed during the hospital encounter of 03/17/21 (from the past 48 hour(s))  Glucose, capillary     Status: Abnormal   Collection Time: 03/20/21  4:12 PM  Result Value Ref Range   Glucose-Capillary 147 (H) 70 - 99 mg/dL    Comment: Glucose reference range applies only to samples taken after fasting for at least 8 hours.  Glucose, capillary     Status: Abnormal   Collection Time: 03/20/21  9:15 PM  Result Value Ref Range   Glucose-Capillary 203 (H) 70 - 99 mg/dL    Comment: Glucose reference range applies only to samples taken after fasting for at least 8 hours.  Glucose, capillary     Status: Abnormal   Collection Time: 03/21/21  6:34 AM  Result Value Ref Range   Glucose-Capillary 55 (L) 70 - 99 mg/dL    Comment: Glucose reference range applies only to samples taken after fasting for at least 8 hours.  Glucose, capillary     Status: None   Collection Time: 03/21/21  6:50 AM  Result Value Ref Range   Glucose-Capillary 77 70 - 99 mg/dL    Comment: Glucose reference range applies only to samples taken after fasting for at least 8 hours.  Glucose, capillary     Status: Abnormal   Collection Time: 03/21/21  7:25 AM  Result Value Ref Range   Glucose-Capillary 65 (L) 70 - 99 mg/dL    Comment: Glucose reference range applies only to samples taken after fasting for at least 8  hours.   Comment 1 Repeat Test   Glucose, capillary     Status: None   Collection Time: 03/21/21  7:28 AM  Result Value Ref Range   Glucose-Capillary 99 70 - 99 mg/dL    Comment: Glucose reference range applies only to samples taken after fasting for at least 8 hours.  Glucose, capillary     Status: Abnormal   Collection Time: 03/21/21  9:12 AM  Result Value Ref Range   Glucose-Capillary 103 (H) 70 - 99 mg/dL    Comment: Glucose reference range applies only to samples taken after fasting for at least 8 hours.  Glucose, capillary     Status: Abnormal   Collection Time: 03/21/21 11:38 AM  Result Value Ref Range   Glucose-Capillary 110 (H) 70 - 99 mg/dL    Comment: Glucose reference range applies only to samples taken after fasting for at least 8 hours.  Glucose, capillary     Status: Abnormal   Collection Time: 03/21/21  4:18 PM  Result Value Ref Range   Glucose-Capillary 150 (H) 70 - 99 mg/dL    Comment: Glucose reference range applies only to samples taken after fasting for at least 8 hours.  Occult blood card to lab, stool     Status: Abnormal   Collection Time: 03/21/21  8:51 PM  Result Value Ref Range   Fecal Occult Bld POSITIVE (A) NEGATIVE    Comment: Performed at [facility], [address] [place], [place] [address]  Glucose, capillary     Status: Abnormal   Collection Time: 03/21/21  8:59 PM  Result Value Ref Range   Glucose-Capillary 271 (H) 70 - 99 mg/dL    Comment: Glucose reference range applies only to samples taken after fasting for at least 8 hours.  Glucose, capillary     Status: None   Collection Time: 03/22/21  6:14 AM  Result Value Ref Range   Glucose-Capillary 73 70 - 99 mg/dL    Comment: Glucose reference range applies only to samples taken after fasting for at least 8 hours.  Glucose, capillary     Status: None   Collection Time: 03/22/21 11:13 AM  Result Value Ref Range   Glucose-Capillary 89 70 - 99 mg/dL    Comment: Glucose reference range  applies only to samples taken after fasting for at least 8 hours.    MR SHOULDER LEFT WO CONTRAST  Result Date: 03/22/2021 CLINICAL DATA:  Shoulder pain, rotator cuff disorder suspected, xray done suspected left rotator cuff tear EXAM: MRI OF THE LEFT SHOULDER WITHOUT CONTRAST TECHNIQUE: Multiplanar, multisequence MR imaging of the shoulder was performed. No intravenous contrast was administered. COMPARISON:  Left shoulder radiograph 03/18/2021 FINDINGS: Rotator cuff: The supraspinatus and infraspinatus  tendons are intact. Teres minor tendon is intact. Subscapularis tendon is intact. Muscles: No muscle atrophy or edema. No intramuscular fluid collection or hematoma. Biceps Long Head: Intraarticular and extraarticular portions of the biceps tendon are intact. Acromioclavicular Joint: Mild arthropathy of the acromioclavicular joint with periarticular bony edema. No significant subacromial/subdeltoid bursal fluid. Glenohumeral Joint: No significant joint effusion.  Mild chondrosis. Labrum: Grossly intact, but evaluation is limited by lack of intraarticular fluid/contrast. Bones: No fracture or dislocation. No aggressive osseous lesion. Other: No fluid collection or hematoma. 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 No evidence of rotator cuff tear.  No evidence of labral tear. Electronically Signed   By: Maurine Simmering M.D.   On: 03/22/2021 08:22    Review of Systems  HENT:  Negative for ear discharge, ear pain, hearing loss and tinnitus.   Eyes:  Negative for photophobia and pain.  Respiratory:  Negative for cough and shortness of breath.   Cardiovascular:  Negative for chest pain.  Gastrointestinal:  Negative for abdominal pain, nausea and vomiting.  Genitourinary:  Negative for dysuria, flank pain, frequency and urgency.  Musculoskeletal:  Negative for arthralgias, back pain, myalgias and neck pain.       Left shoulder stiffness  Neurological:  Negative  for dizziness and headaches.  Hematological:  Does not bruise/bleed easily.  Psychiatric/Behavioral:  The patient is not nervous/anxious.   Blood pressure (!) 146/73, pulse 85, temperature 98.3 F (36.8 C), resp. rate 17, height 5\' 6"  (1.676 m), weight 76.1 kg, last menstrual period 08/21/2016, SpO2 100 %. Physical Exam Constitutional:      General: She is not in acute distress.    Appearance: She is well-developed. She is not diaphoretic.  HENT:     Head: Normocephalic and atraumatic.  Eyes:     General: No scleral icterus.       Right eye: No discharge.        Left eye: No discharge.     Conjunctiva/sclera: Conjunctivae normal.  Cardiovascular:     Rate and Rhythm: Normal rate and regular rhythm.  Pulmonary:     Effort: Pulmonary effort is normal. No respiratory distress.  Musculoskeletal:     Cervical back: Normal range of motion.     Comments: Left shoulder, elbow, wrist, digits- no skin wounds, nontender, no instability, PROM/AROM shoulder limited with ext, abduction ~20 degrees < right.  Sens  Ax/R/M/U intact  Mot   Ax/ R/ PIN/ M/ AIN/ U intact  Rad 2+  Skin:    General: Skin is warm and dry.  Neurological:     Mental Status: She is alert.  Psychiatric:        Mood and Affect: Mood normal.        Behavior: Behavior normal.    Assessment/Plan: Left shoulder adhesive capsulitis -- Expect she got still by staying in sling for too long after her clavicle fracture. Have encouraged her to continue working with PT/OT to try and regain as much motion as she can. She may f/u with Drs. Charlynne Cousins, or [employer] as OP.    Lisette Abu, PA-C Orthopedic Surgery [phone] 03/22/2021, 1:02 PM

## 2021-03-22 NOTE — Progress Notes (Signed)
Physical Therapy Session Note  Patient Details  Name: Jennifer [name] MRN: [id number] Date of Birth: Sep 07, 1970  Today's Date: 03/22/2021 PT Individual Time: 1000-1058 + 1324-4010 PT Individual Time Calculation (min): 58 min  + 72 min  Short Term Goals: Week 1:  PT Short Term Goal 1 (Week 1): pt will transfer bed>< wc with CGA PT Short Term Goal 2 (Week 1): pt will propel wc x 150' with supervision and cues for steering on level tile PT Short Term Goal 3 (Week 1): pt will move sit>< stand iwht CGA PT Short Term Goal 4 (Week 1): pt will perform gait with RW on level tile x 100', CGA PT Short Term Goal 5 (Week 1): pt will be assessed for bil AFOs  Skilled Therapeutic Interventions/Progress Updates:     1st session: Pt on toilet at start of session - NT present for supervision with handoff of care to PT. No interpreter present - pt able to understand some basic English and gestures, appropriately. Pt with poor safety awareness while leaving bathroom. She would lean on the door frame without holding on to her RW while attempting to adjust pants. Education on her high falls risk and need for UE support for balance, pt somewhat receptive. Pt instructed in w/c propulsion - propelled herself ~139ft with supervision/CGA for safety due to poor ability to motor plan UE symmetrical strokes, resulting in veering L<>R throughout hallway. Required minA for navigating doorway.Pt transported remaining distance downstairs to 16M rehab gym. Retrieved B AFO's (walk-on trimmables) to address foot drop as pt brought tennis shoes from. AFO's tight fitting in shoes and difficult to don. Due to neuropathy in feet, anticipate need for further ed on skin checks. Pt ambulated ~46ft with CGA and RW within hallways with improved foot clearance and gait mechanics compared to no AFO's. She still requires VC for safety awareness with RW due to such heavy reliance of UE support through RW with forward flexed trunk. She completed curb  transfer training on 4inch curb with modA and RW - again showing poor safety awareness with RW management and attempting to lift RW prior to being close enough to the curb - this occurred even after demonstration/explanation, question language barrier vs lack of carryover. She completed sidelying there-ex as follows on mat table: -2x10 clamshells on R & L - AAROM for full ROM and assist for reducing rolling to compensate -4x10 antigravity hip flexion with powder board b/w legs - some AAROM needed for achieving correct sequencing due to low tone and poor motor control/planning.  Pt required minA for supine<>sit from flat surface on mat table, assist primarily for trunk control to upright. Completed squat<>pivot transfer but required mod/maxA for safety due to poor ability to lift hips and clear to w/c. She also lacked awareness of her foot placement prior to transfer, as they were not under her but too far forwards. Ed on squat<>pivot technique and importance of safety to reduce her high falls risk. Pt transported back upstairs to her room. Remained seated in w/c with safety belt alarm on, all needs within reach. Interpreter arriving during last 5 minutes of session and remained at bedside at conclusion of session.   2nd session: Pt seen seated in w/c at start of session - agreeable to PT tx without reports of pain. Interpreter and her daughter at bedside. Daughter brought in slip-on shoes - they are wide fitting but there is no tongue - ed on proper shoe wear to allow AFO to fit and she  voiced understanding. Pt transported downstairs to 36M rehab gym for time. Sit<>stand to RW with CGA from w/c. Ambulates short distance in rehab gym, ~67ft, with CGA and RW. No AFO's on, demo's bilateral foot slap, heavy reliance of UE support through RW, and some forward flexed trunk. Collaborated with CPO to determine AFO needs - anticipate need for Walk On [place] with lateral strut due to some mild pronation. CPO states  braces will be delivered either Monday or Tuesday next week. Pt instructed in repeated sit<>stands without RW from low mat table - able to complete with minA but only able to complete x3 reps prior to fatigue. Demonstrates Gower's Sign (walking hands up legs) while attempting to stand. Pt then instructed in repeated sit<>stands with task overlay for matching cards to back of mirror to challenge balance and encourage reaching outside BOS - required min cues for accuracy, likely related to language barrier and L vision deficits. Pt then tasked to complete dynamic standing balance task while tossing ball to rebounder in standing with minA guard for balance - pt fearful of letting go of RW, unable to complete without BUE support. Requested to complete in sitting which she did without difficulty. Educated on reasoning for standing to challenge balance and gain stability but pt expresses fearful of falling. Pt then instructed in cone stacking in standing with emphasis on reaching outside BOS, midline reaching, and unsupported standing - pt required minA guard while doing this and relying on unilateral support to table for balance. Pt assisted back to her w/c with stand<>pivot transfer and RW with CGA - VC for safety awareness and RW management. Returned upstairs to her room - remained seated in w/c with safety belt alarm on, all needs in reach.   Therapy Documentation Precautions:  Precautions Precautions: Fall (bil foot drop; need interpreter) Restrictions Weight Bearing Restrictions: No General:    Therapy/Group: Individual Therapy  Jennifer [name] 03/22/2021, 7:38 AM

## 2021-03-22 NOTE — Patient Care Conference (Signed)
Inpatient RehabilitationTeam Conference and Plan of Care Update Date: 03/21/2021   Time: 11:19 AM    Patient Name: Jennifer [name]      Medical Record Number: [id number]  Date of Birth: [birth year] Sex: Female         Room/Bed: 7J69C/7E93Y-10 Payor Info: Payor: [employer] / Plan: [place] MEDICAID [place] / Product Type: *No Product type* /    Admit Date/Time:  03/17/2021  2:03 PM  Primary Diagnosis:  [place] [facility] Problems: Principal Problem:   Debility    Expected Discharge Date: Expected Discharge Date: 04/04/21  Team Members Present: Physician leading conference: Dr. Leeroy Cha Social Worker Present: Ovidio Kin, LCSW Nurse Present: Dorien Chihuahua, RN PT Present: Ginnie Smart, PT OT Present: Leretha Pol, OT SLP Present: Lillie Columbia, SLP PPS Coordinator present : Gunnar Fusi, SLP     Current Status/Progress Goal Weekly Team Focus  Bowel/Bladder   Pt is continent of bowel and bladder; LBM: 11/28  Remain contient of bowel and bladder with minimal assistance.  Assess bowel and bladder needs q shift and PRN and offer toileting q 2 hours while awake.   Swallow/Nutrition/ Hydration             ADL's   min assist functional transfers, setup UB self care, min assist LB self care, LUE weakness, impaired safety awareness, BLE swelling present  supervision-mod I  w/c level functional mobility including squat pivot transfer training, self care training, initiated TED hose BLE for edema management, UE strengthening and endurance training   Mobility   supervision bed mobility, minA for sit<>stand and stand<>pivot transfers, gait ~75ft with CGA/minA and RW. Requires modA for navigating 4inch curb with RW. Requires bilateral AFO's but hasn't had any shoes.  supervision/mod I  Needs to work on stair/curb training, bilateral foot drop, functional transfers, safety awareness with RW, DC planning.   Communication              Safety/Cognition/ Behavioral Observations            Pain   Pt denies pain  Pain score less than or equal to 3 out of 10.  Assess pain q shift and prn medicate as directed.   Skin   No skin breakdown.  Patient to remain free from infection or skin breakdown.  Assess skin q shift and prn.     Discharge Planning:  Home with family may be times during the day she is alone due to husband works and daughter has school. Will have evening care   Team Discussion: MD adjusting medications.   Patient on target to meet rehab goals: yes, currently completes squat pivot transfers and sit - stand with min assist. Completes self care with min assist. Progress limited by left upper extremity weakness, bil foot drop and limited mobility right UE(RTC tear?). Goals for discharge set for supervision and mod I for toileting.  *See Care Plan and progress notes for long and short-term goals.   Revisions to Treatment Plan:  Referral to OP ophthalmology AFOs recommended for bil foot drop Estim- neuromuscular re- education  Curb training and stair training Xray right shoulder and MRI to assess for possible RTC injury and prep for OP referral for follow up  Teaching Needs: Safety, transfers, toileting, medications, secondary risk management, etc  Current Barriers to Discharge: Decreased caregiver support  Possible Resolutions to Barriers: Family education     Medical Summary Current Status: hypoglycemic x2 days, diarrhea, overweight (BMI 27.08), left eye blindness  Barriers to Discharge: Weight;Medical stability;Decreased family/caregiver support  Barriers to Discharge Comments: does not have 24/7 supervision, hypoglycemic x2 days, diarrhea, overweight (BMI 27.08), left eye blindness, bilateral foot drop, left sided shoulder pain with limited range of motion Possible Resolutions to [employer] Focus: d/c ISS, decrease Levemir to 10U, provide dietary education, handicap placard provided, will order  bilateral foot AFO, MRI left shoulder ordered   Continued Need for Acute Rehabilitation Level of Care: The patient requires daily medical management by a physician with specialized training in physical medicine and rehabilitation for the following reasons: Direction of a multidisciplinary physical rehabilitation program to maximize functional independence : Yes Medical management of patient stability for increased activity during participation in an intensive rehabilitation regime.: Yes Analysis of laboratory values and/or radiology reports with any subsequent need for medication adjustment and/or medical intervention. : Yes   I attest that I was present, lead the team conference, and concur with the assessment and plan of the team.   Dorien Chihuahua B 03/22/2021, 8:09 AM

## 2021-03-22 NOTE — Progress Notes (Signed)
Occupational Therapy Session Note  Patient Details  Name: Jennifer [name] MRN: [id number] Date of Birth: 10/17/1970  Today's Date: 03/22/2021 OT Individual Time: 0905-1000 OT Individual Time Calculation (min): 55 min    Short Term Goals: Week 1:  OT Short Term Goal 1 (Week 1): Pt will complete toileting with supervision OT Short Term Goal 2 (Week 1): Pt will complete stand pivot to [facility] with CGA OT Short Term Goal 3 (Week 1): Pt will dress LB with supervision OT Short Term Goal 4 (Week 1): Pt will bathe UB/LB with CGA  Skilled Therapeutic Interventions/Progress Updates:    Pt semi reclined in bed, no c/o pain, agreeable to bathing in shower.  Supine to sit with supervision, squat pivot to w/c with min assist.  Pt transported to bathroom and completed squat pivot to shower bench with min assist and tactile cues for correct hand placement (pt attempts to cross over body for seat with wrong UE).  Pt doffed shirt with supervision, sit<>stand using grab bar to doff pants and pullup, and seated to doff socks.  Pt completed UB/LB bathing seated with supervision.  Donned underwear with min assist needing max multimodal cues with poor follow through for safety awareness during standing.  Pt turned with back to wall and leaned against it limiting her access to holding onto grab bar.  Pt was able to pull up over hips but appearing unsteady.  Further cues needed to correct position and utilize grab bars for stand to sit at shower bench.  Pt donned shirt with setup and completed squat pivot back to w/c with similar assist as previously mentioned.  Returned to main room and donned pants with CGA using RW sit<>stand level.  Donned TED hose with mod assist.  Interpreter arrived and provided assist with communication as needed for remainder of session.  Provided pt with measurement sheet and instructed to give to daughter or husband to assess doorway widths for accessibility determination.  Also provided pt with  alternate foot rest for improved comfort.  Instructed pt through medium resistive band strengthening LUE including shoulder ER at side and elbow extension. 3 x 15 reps.  Call bell in reach at end of session.  Therapy Documentation Precautions:  Precautions Precautions: Fall (bil foot drop; need interpreter) Restrictions Weight Bearing Restrictions: No   Therapy/Group: Individual Therapy  Ezekiel Slocumb 03/22/2021, 12:17 PM

## 2021-03-22 NOTE — Progress Notes (Signed)
PROGRESS NOTE   Subjective/Complaints: No new complaints this morning CBGs better controlled without hypoglycemia- edited diet order to be carb modified as CBGS up to 273 still  ROS: +Bilateral lower extremity foot drop, +left shoulder pain   Objective:   MR SHOULDER LEFT WO CONTRAST  Result Date: 03/22/2021 CLINICAL DATA:  Shoulder pain, rotator cuff disorder suspected, xray done suspected left rotator cuff tear EXAM: MRI OF THE LEFT SHOULDER WITHOUT CONTRAST TECHNIQUE: Multiplanar, multisequence MR imaging of the shoulder was performed. No intravenous contrast was administered. COMPARISON:  Left shoulder radiograph 03/18/2021 FINDINGS: Rotator cuff: The supraspinatus and infraspinatus tendons are intact. Teres minor tendon is intact. Subscapularis tendon is intact. Muscles: No muscle atrophy or edema. No intramuscular fluid collection or hematoma. Biceps Long Head: Intraarticular and extraarticular portions of the biceps tendon are intact. Acromioclavicular Joint: Mild arthropathy of the acromioclavicular joint with periarticular bony edema. No significant subacromial/subdeltoid bursal fluid. Glenohumeral Joint: No significant joint effusion.  Mild chondrosis. Labrum: Grossly intact, but evaluation is limited by lack of intraarticular fluid/contrast. Bones: No fracture or dislocation. No aggressive osseous lesion. Other: No fluid collection or hematoma. 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 No evidence of rotator cuff tear.  No evidence of labral tear. Electronically Signed   By: Maurine Simmering M.D.   On: 03/22/2021 08:22   Recent Labs    03/20/21 0518  WBC 4.7  HGB 8.8*  HCT 28.4*  PLT 160   No results for input(s): NA, K, CL, CO2, GLUCOSE, BUN, CREATININE, CALCIUM in the last 72 hours.   Intake/Output Summary (Last 24 hours) at 03/22/2021 1143 Last data filed at 03/22/2021  0700 Gross per 24 hour  Intake 420 ml  Output --  Net 420 ml        Physical Exam: Vital Signs Blood pressure 124/63, pulse 76, temperature 98.9 F (37.2 C), temperature source Oral, resp. rate 17, height 5\' 6"  (1.676 m), weight 76.1 kg, last menstrual period 08/21/2016, SpO2 99 %. Gen: no distress, normal appearing HEENT: oral mucosa pink and moist, NCAT Cardio: Reg rate Chest: normal effort, normal rate of breathing Abd: soft, non-distended Ext: no edema Psych: pleasant, normal affect Skin: No evidence of breakdown, no evidence of rash Neurologic: Cranial nerves II through XII intact, motor strength is 4/5 in bilateral deltoid, bicep, tricep, grip, hip flexor, knee extensors, bilateral lower extremity foot drop  Cerebellar exam normal finger to nose to finger as well as heel to shin in bilateral upper and lower extremities Musculoskeletal: Full range of motion in all 4 extremities. No joint swelling    Assessment/Plan: 1. Functional deficits which require 3+ hours per day of interdisciplinary therapy in a comprehensive inpatient rehab setting. Physiatrist is providing close team supervision and 24 hour management of active medical problems listed below. Physiatrist and rehab team continue to assess barriers to discharge/monitor patient progress toward functional and medical goals  Care Tool:  Bathing              Bathing assist Assist Level: Minimal Assistance - Patient > 75%     Upper Body Dressing/Undressing Upper body dressing   What is the patient wearing?: Pull  over shirt    Upper body assist Assist Level: Supervision/Verbal cueing    Lower Body Dressing/Undressing Lower body dressing      What is the patient wearing?: Pants     Lower body assist Assist for lower body dressing: Minimal Assistance - Patient > 75%     Toileting Toileting    Toileting assist Assist for toileting: Minimal Assistance - Patient > 75%     Transfers Chair/bed  transfer  Transfers assist     Chair/bed transfer assist level: Moderate Assistance - Patient 50 - 74%     Locomotion Ambulation   Ambulation assist      Assist level: Minimal Assistance - Patient > 75% Assistive device: Walker-rolling Max distance: 45   Walk 10 feet activity   Assist     Assist level: Minimal Assistance - Patient > 75% Assistive device: Walker-rolling   Walk 50 feet activity   Assist Walk 50 feet with 2 turns activity did not occur: Safety/medical concerns (fatigued)         Walk 150 feet activity   Assist Walk 150 feet activity did not occur: Safety/medical concerns (fatigue)         Walk 10 feet on uneven surface  activity   Assist Walk 10 feet on uneven surfaces activity did not occur: N/A         Wheelchair     Assist Is the patient using a wheelchair?: Yes Type of Wheelchair: Manual    Wheelchair assist level: Minimal Assistance - Patient > 75% Max wheelchair distance: 150    Wheelchair 50 feet with 2 turns activity    Assist        Assist Level: Minimal Assistance - Patient > 75%   Wheelchair 150 feet activity     Assist      Assist Level: Minimal Assistance - Patient > 75%   Blood pressure 124/63, pulse 76, temperature 98.9 F (37.2 C), temperature source Oral, resp. rate 17, height 5\' 6"  (1.676 m), weight 76.1 kg, last menstrual period 08/21/2016, SpO2 99 %.  Medical Problem List and Plan: 1.  Debility functional deficits secondary to proctitis/colitis/GI bleed/multi medical             -patient may shower             -ELOS/Goals: 5-7 days modI  Continue CIR 2.  Impaired mobility: continue SCDs at night as per daughter's request.  -DVT/anticoagulation:  Pharmaceutical: Other (comment) Eliquis             -antiplatelet therapy: N/A 3. Left shoulder pain and limited range of motion: MRI shows arthritis- ortho consulted for shoulder injection. Continue Neurontin 300 mg nightly 4. Mood:  Provide emotional support             -antipsychotic agents: N/A 5. Neuropsych: This patient is capable of making decisions on her own behalf. 6. Skin/Wound Care: Routine skin checks 7. Fluids/Electrolytes/Nutrition: Routine in and outs with follow-up chemistries 8.  Proctitis/colitis/GI bleed.  Follow-up GI Dr. Paulita Fujita.  Completed course of Omnicef and Flagyl.  Hemoccult positive. Consult GI regarding anticoagulation 9.  Prior CVA/abnormal MRI.  Recent admission 02/25/2021 to 03/05/2021.  Plan repeat MRI 4 weeks per neurology services 10.  Atrial fibrillation.  Cardiac rate controlled.  Continue Eliquis. Magnesium level 1.9-- will hold off on supplementation right now given her loose stools 11.  Hypertension.  Norvasc 2.5 mg twice daily.  Monitor with increased mobility 12.  Uncontrolled diabetes mellitus.  Hemoglobin A1c greater than  12.  Levemir 18 units nightly.  Check blood sugars before meals and at bedtime.  Diabetic teaching CBG (last 3)  Recent Labs    03/21/21 2059 03/22/21 0614 03/22/21 1113  GLUCAP 271* 73 89  Decrease Levemir to 10U Metformin XR 100mg  BID - will hold due to freq loose stools  D/c ISS  13.  AKI versus CKD.  Placed nursing order to encourage 6-8 glasses of water per day 14.  Glaucoma.  Left eye blindness.  Follow Dr. Catalina Antigua [facility].  Continue eyedrops. She asked that Bromidine be discontinued due to burning. I have done so, and asked husband to call Dr. Manuella Ghazi to scheduled outpatient ophthalmology appointment for soon after she leaves here. Messaged Neoma Laming to help get husband permanent handicap placard on Monday.  15.  Urinary retention.  Foley tube removed.  Follow-up outpatient urology as needed 16.  Medical noncompliance.  Counseling 17. Overweight BMI 27.08: provide dietary education.  18. Bilateral lower extremity edema: recommended low sodium diet  19.  Diarrhea, hold metformin   LOS: 5 days A FACE TO FACE EVALUATION WAS PERFORMED  Clide Deutscher  Tarnesha Ulloa 03/22/2021, 11:43 AM

## 2021-03-23 ENCOUNTER — Other Ambulatory Visit: Payer: Self-pay | Admitting: Internal Medicine

## 2021-03-23 LAB — GLUCOSE, CAPILLARY
Glucose-Capillary: 109 mg/dL — ABNORMAL HIGH (ref 70–99)
Glucose-Capillary: 236 mg/dL — ABNORMAL HIGH (ref 70–99)
Glucose-Capillary: 316 mg/dL — ABNORMAL HIGH (ref 70–99)
Glucose-Capillary: 289 mg/dL — ABNORMAL HIGH (ref 70–99)

## 2021-03-23 LAB — CBC WITH DIFFERENTIAL/PLATELET
Abs Immature Granulocytes: 0.01 10*3/uL (ref 0.00–0.07)
Basophils Absolute: 0 10*3/uL (ref 0.0–0.1)
Basophils Relative: 1 %
Eosinophils Absolute: 0.2 10*3/uL (ref 0.0–0.5)
Eosinophils Relative: 5 %
HCT: 26.7 % — ABNORMAL LOW (ref 36.0–46.0)
Hemoglobin: 8.3 g/dL — ABNORMAL LOW (ref 12.0–15.0)
Immature Granulocytes: 0 %
Lymphocytes Relative: 42 %
Lymphs Abs: 2.1 10*3/uL (ref 0.7–4.0)
MCH: 27.7 pg (ref 26.0–34.0)
MCHC: 31.1 g/dL (ref 30.0–36.0)
MCV: 89 fL (ref 80.0–100.0)
Monocytes Absolute: 0.4 10*3/uL (ref 0.1–1.0)
Monocytes Relative: 9 %
Neutro Abs: 2.1 10*3/uL (ref 1.7–7.7)
Neutrophils Relative %: 43 %
Platelets: 176 10*3/uL (ref 150–400)
RBC: 3 MIL/uL — ABNORMAL LOW (ref 3.87–5.11)
RDW: 13.1 % (ref 11.5–15.5)
WBC: 4.8 10*3/uL (ref 4.0–10.5)
nRBC: 0 % (ref 0.0–0.2)

## 2021-03-23 LAB — CBC
HCT: 28.2 % — ABNORMAL LOW (ref 36.0–46.0)
Hemoglobin: 8.6 g/dL — ABNORMAL LOW (ref 12.0–15.0)
MCH: 27 pg (ref 26.0–34.0)
MCHC: 30.5 g/dL (ref 30.0–36.0)
MCV: 88.4 fL (ref 80.0–100.0)
Platelets: 171 10*3/uL (ref 150–400)
RBC: 3.19 MIL/uL — ABNORMAL LOW (ref 3.87–5.11)
RDW: 12.9 % (ref 11.5–15.5)
WBC: 4.2 10*3/uL (ref 4.0–10.5)
nRBC: 0 % (ref 0.0–0.2)

## 2021-03-23 MED ORDER — INSULIN DETEMIR 100 UNIT/ML ~~LOC~~ SOLN
12.0000 [IU] | Freq: Every day | SUBCUTANEOUS | Status: DC
Start: 2021-03-23 — End: 2021-03-26
  Administered 2021-03-23 – 2021-03-25 (×3): 12 [IU] via SUBCUTANEOUS
  Filled 2021-03-23 (×5): qty 0.12

## 2021-03-23 NOTE — Progress Notes (Signed)
Occupational Therapy Session Note  Patient Details  Name: Jennifer [name] MRN: [id number] Date of Birth: 09/02/1970  Today's Date: 03/23/2021 OT Individual Time: 1300-1410 OT Individual Time Calculation (min): 70 min    Short Term Goals: Week 1:  OT Short Term Goal 1 (Week 1): Pt will complete toileting with supervision OT Short Term Goal 2 (Week 1): Pt will complete stand pivot to [facility] with CGA OT Short Term Goal 3 (Week 1): Pt will dress LB with supervision OT Short Term Goal 4 (Week 1): Pt will bathe UB/LB with CGA  Skilled Therapeutic Interventions/Progress Updates:    Pt sitting up in w/c, interpreter present for communication needs.  Pt c/o 5/10 pain anterior right leg over shin and stating leg feels heavy.  Nurse made aware.  Assessed skin and no redness/discoloration, heat, or breakage noted.  RLE appearing with edema level with no changes or increase since assessed by therapist yesterday.  Medication administered during end of OT session by nursing.  Pt agreeable to participate in session.  Blocked practice completed squat pivot transfers from w/c<>EOB x 5 trials and w/c<>bariatric BSC.  Pt required mod assist initially but able to fade assist level to CGA after training on body mechanics and weight shifting technique.  Pt needing step by step verbal cues but able to also fade cueing to only question cues instead of instructional.  Also trained pt in proper w/c positioning, locking of brakes, and removing left armrest out of the way in order to complete squat pivot safely.  Pt requiring min assist to locate and unlock armrest. May benefit from placing brightly colored tape on lock button to increase awareness of location and improve carryover.  Pt requesting back to bed at end of session.  Squat pivot with min assist and mod cues.  Bed alarm on and call bell in reach.   Therapy Documentation Precautions:  Precautions Precautions: Fall (bil foot drop; need interpreter) Restrictions Weight  Bearing Restrictions: No    Therapy/Group: Individual Therapy  Ezekiel Slocumb 03/23/2021, 2:55 PM

## 2021-03-23 NOTE — Progress Notes (Signed)
Occupational Therapy Session Note  Patient Details  Name: Jennifer [name] MRN: [id number] Date of Birth: 17-Oct-1970  Today's Date: 03/23/2021 OT Individual Time: 1281-1886 OT Individual Time Calculation (min): 40 min    Short Term Goals: Week 1:  OT Short Term Goal 1 (Week 1): Pt will complete toileting with supervision OT Short Term Goal 2 (Week 1): Pt will complete stand pivot to [facility] with CGA OT Short Term Goal 3 (Week 1): Pt will dress LB with supervision OT Short Term Goal 4 (Week 1): Pt will bathe UB/LB with CGA  Skilled Therapeutic Interventions/Progress Updates:    Pt seated EOB upon arrival with husband present. Pt donned socks with min A and shoes with max A. Squat pivot transfer to w/c with mod A. Pt transitioned to gym and transferred to chair with max A. Initial focus on Lt houlder PROM/AAROM.AROM for gentle stretching at end ranges with ER and extension. Soft tissue mobs for pain mgmt and myofascial release/stretching. Pt practiced squat pivot tranfsers to Rt and Lt. Mod A to Rt and max A to Lt with limited push when transferring to Lt. Recommended to pt and husband to use squat pivot transfers when dishcharing home. Pt and husband verbalized understanding. Transfer assistance required inconsistent. Pt returned to room and remained in w/c with belt alarm activated. All needs within reach.   Therapy Documentation Precautions:  Precautions Precautions: Fall (bil foot drop; need interpreter) Restrictions Weight Bearing Restrictions: No  Pain:  Pt c/o Lt shoulder discomfort with PROM/AROM at end ranges; emotional support   Therapy/Group: Individual Therapy  Leroy Libman 03/23/2021, 1:02 PM

## 2021-03-23 NOTE — Progress Notes (Signed)
Physical Therapy Session Note  Patient Details  Name: Jennifer [name] MRN: [id number] Date of Birth: February 16, 1971  Today's Date: 03/23/2021 PT Individual Time: 1000-1057 PT Individual Time Calculation (min): 57 min   Short Term Goals: Week 1:  PT Short Term Goal 1 (Week 1): pt will transfer bed>< wc with CGA PT Short Term Goal 2 (Week 1): pt will propel wc x 150' with supervision and cues for steering on level tile PT Short Term Goal 3 (Week 1): pt will move sit>< stand iwht CGA PT Short Term Goal 4 (Week 1): pt will perform gait with RW on level tile x 100', CGA PT Short Term Goal 5 (Week 1): pt will be assessed for bil AFOs  Skilled Therapeutic Interventions/Progress Updates:     Pt seated in w/c on arrival with interpreter present to assist with communication. Pt denies pain and is agreeable to therapy. Family has brought her tennis shoes that have a tongue but they are not wide fitting. Pt transported in w/c downstairs to 14M rehab gym for time. Retrieved bilateral Walk On AFO's (no lateral struts available). Donned these with totalA, difficulty with fitting due to narrow shoe. Had to remove hospital socks to allow feet to fit. Educated on importance of having regular socks to protect feet from AFO's and she voiced undersanding - she will ask her family. Instructed in gait training within hallways - ambulated 4x64ft with CGA and RW. Gait deficits improved with AFO's with improved swing phase, less foot slap, increased rocker bilaterally. Cues for keeping body within walker frame due to forward flexed trunk and heavy reliance of UE 's throug hwalker. Increased fatigue during last x2 gait trials that required a few standing rest breaks - BLE weakness limiting gait distances. Instructed in sit<>stands from w/c to RW - able to complete x3 with CGA and the 4th rep requires minA due to fatigue. Heavy forward trunk lean with reliance of UE 's to assist with pushing to stand. This was completed twice. (Of  note, pt was only able to complete x2 reps in a row 3 days ago). Pt returned upstairs to her room where she remained seated in w/c with safety belt alarm on, all needs within reach.   Therapy Documentation Precautions:  Precautions Precautions: Fall (bil foot drop; need interpreter) Restrictions Weight Bearing Restrictions: No General:    Therapy/Group: Individual Therapy  Alger Simons 03/23/2021, 7:35 AM

## 2021-03-23 NOTE — Progress Notes (Addendum)
PROGRESS NOTE   Subjective/Complaints: Feeling a little dizzy this morning and nurse reports unsteadiness. Will repeat CBC given anemia.  Left shoulder pain improved  ROS: +Bilateral lower extremity foot drop, +left shoulder pain   Objective:   MR SHOULDER LEFT WO CONTRAST  Result Date: 03/22/2021 CLINICAL DATA:  Shoulder pain, rotator cuff disorder suspected, xray done suspected left rotator cuff tear EXAM: MRI OF THE LEFT SHOULDER WITHOUT CONTRAST TECHNIQUE: Multiplanar, multisequence MR imaging of the shoulder was performed. No intravenous contrast was administered. COMPARISON:  Left shoulder radiograph 03/18/2021 FINDINGS: Rotator cuff: The supraspinatus and infraspinatus tendons are intact. Teres minor tendon is intact. Subscapularis tendon is intact. Muscles: No muscle atrophy or edema. No intramuscular fluid collection or hematoma. Biceps Long Head: Intraarticular and extraarticular portions of the biceps tendon are intact. Acromioclavicular Joint: Mild arthropathy of the acromioclavicular joint with periarticular bony edema. No significant subacromial/subdeltoid bursal fluid. Glenohumeral Joint: No significant joint effusion.  Mild chondrosis. Labrum: Grossly intact, but evaluation is limited by lack of intraarticular fluid/contrast. Bones: No fracture or dislocation. No aggressive osseous lesion. Other: No fluid collection or hematoma. 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 No evidence of rotator cuff tear.  No evidence of labral tear. Electronically Signed   By: Maurine Simmering M.D.   On: 03/22/2021 08:22   Recent Labs    03/23/21 0929  WBC 4.2  HGB 8.6*  HCT 28.2*  PLT 171   No results for input(s): NA, K, CL, CO2, GLUCOSE, BUN, CREATININE, CALCIUM in the last 72 hours.   Intake/Output Summary (Last 24 hours) at 03/23/2021 1020 Last data filed at 03/22/2021 1300 Gross per 24  hour  Intake 120 ml  Output --  Net 120 ml        Physical Exam: Vital Signs Blood pressure (!) 141/81, pulse 82, temperature 98.7 F (37.1 C), resp. rate 14, height 5\' 6"  (1.676 m), weight 76.1 kg, last menstrual period 08/21/2016, SpO2 100 %. Gen: no distress, normal appearing HEENT: oral mucosa pink and moist, NCAT Cardio: Reg rate Chest: normal effort, normal rate of breathing Abd: soft, non-distended Ext: no edema Psych: pleasant, normal affect Skin: intact Neurologic: Cranial nerves II through XII intact, motor strength is 4/5 in bilateral deltoid, bicep, tricep, grip, hip flexor, knee extensors, bilateral lower extremity foot drop  Cerebellar exam normal finger to nose to finger as well as heel to shin in bilateral upper and lower extremities Musculoskeletal: Full range of motion in all 4 extremities. No joint swelling    Assessment/Plan: 1. Functional deficits which require 3+ hours per day of interdisciplinary therapy in a comprehensive inpatient rehab setting. Physiatrist is providing close team supervision and 24 hour management of active medical problems listed below. Physiatrist and rehab team continue to assess barriers to discharge/monitor patient progress toward functional and medical goals  Care Tool:  Bathing              Bathing assist Assist Level: Minimal Assistance - Patient > 75%     Upper Body Dressing/Undressing Upper body dressing   What is the patient wearing?: Pull over shirt    Upper body assist Assist Level: Supervision/Verbal cueing  Lower Body Dressing/Undressing Lower body dressing      What is the patient wearing?: Pants     Lower body assist Assist for lower body dressing: Minimal Assistance - Patient > 75%     Toileting Toileting    Toileting assist Assist for toileting: Minimal Assistance - Patient > 75%     Transfers Chair/bed transfer  Transfers assist     Chair/bed transfer assist level: Moderate  Assistance - Patient 50 - 74%     Locomotion Ambulation   Ambulation assist      Assist level: Minimal Assistance - Patient > 75% Assistive device: Walker-rolling Max distance: 45   Walk 10 feet activity   Assist     Assist level: Minimal Assistance - Patient > 75% Assistive device: Walker-rolling   Walk 50 feet activity   Assist Walk 50 feet with 2 turns activity did not occur: Safety/medical concerns (fatigued)         Walk 150 feet activity   Assist Walk 150 feet activity did not occur: Safety/medical concerns (fatigue)         Walk 10 feet on uneven surface  activity   Assist Walk 10 feet on uneven surfaces activity did not occur: N/A         Wheelchair     Assist Is the patient using a wheelchair?: Yes Type of Wheelchair: Manual    Wheelchair assist level: Minimal Assistance - Patient > 75% Max wheelchair distance: 150    Wheelchair 50 feet with 2 turns activity    Assist        Assist Level: Minimal Assistance - Patient > 75%   Wheelchair 150 feet activity     Assist      Assist Level: Minimal Assistance - Patient > 75%   Blood pressure (!) 141/81, pulse 82, temperature 98.7 F (37.1 C), resp. rate 14, height 5\' 6"  (1.676 m), weight 76.1 kg, last menstrual period 08/21/2016, SpO2 100 %.  Medical Problem List and Plan: 1.  Debility functional deficits secondary to proctitis/colitis/GI bleed/multi medical             -patient may shower             -ELOS/Goals: 5-7 days modI  Continue CIR 2.  Impaired mobility: continue SCDs at night as per daughter's request.  -DVT/anticoagulation:  Pharmaceutical: Other (comment) Eliquis             -antiplatelet therapy: N/A 3. Left shoulder pain and limited range of motion: MRI shows arthritis- ortho consulted for shoulder injection. Continue Neurontin 300 mg nightly 4. Mood: Provide emotional support             -antipsychotic agents: N/A 5. Neuropsych: This patient is  capable of making decisions on her own behalf. 6. Skin/Wound Care: Routine skin checks 7. Fluids/Electrolytes/Nutrition: Routine in and outs with follow-up chemistries 8.  Proctitis/colitis/GI bleed.  Follow-up GI Dr. Paulita Fujita.  Completed course of Omnicef and Flagyl.  Hemoccult positive. Discussed with GI and conservative management recommended for now. 8.6 today, will repeat again tomorrow given some unsteadiness and dizziness today.  9.  Prior CVA/abnormal MRI.  Recent admission 02/25/2021 to 03/05/2021.  Plan repeat MRI 4 weeks per neurology services 10.  Atrial fibrillation.  Cardiac rate controlled. Continue Eliquis. Magnesium level 1.9-- will hold off on supplementation right now given her loose stools 11.  Hypertension.  Norvasc 2.5 mg twice daily.  Monitor with increased mobility 12.  Uncontrolled diabetes mellitus.  Hemoglobin A1c greater than  12.  Levemir 18 units nightly.  Check blood sugars before meals and at bedtime.  Diabetic teaching CBG (last 3)  Recent Labs    03/22/21 1620 03/22/21 2052 03/23/21 0545  GLUCAP 274* 304* 109*  Increase Levemir to 12U Metformin XR 100mg  BID - will hold due to freq loose stools  D/c ISS due to hypoglycemia.  13.  AKI versus CKD.  Placed nursing order to encourage 6-8 glasses of water per day 14.  Glaucoma.  Left eye blindness.  Follow Dr. Catalina [facility].  Continue eyedrops. She asked that Bromidine be discontinued due to burning. I have done so, and asked husband to call Dr. Manuella Ghazi to scheduled outpatient ophthalmology appointment for soon after she leaves here. Messaged Neoma Laming to help get husband permanent handicap placard on Monday.  15.  Urinary retention.  Foley tube removed.  Follow-up outpatient urology as needed 16.  Medical noncompliance.  Counseling 17. Overweight BMI 27.08: provide dietary education.  18. Bilateral lower extremity edema: recommended low sodium diet  19.  Diarrhea, hold metformin   LOS: 6 days A FACE TO FACE  EVALUATION WAS PERFORMED  Charlet Harr P Saed Hudlow 03/23/2021, 10:20 AM

## 2021-03-24 DIAGNOSIS — N179 Acute kidney failure, unspecified: Secondary | ICD-10-CM

## 2021-03-24 DIAGNOSIS — D62 Acute posthemorrhagic anemia: Secondary | ICD-10-CM

## 2021-03-24 DIAGNOSIS — E1165 Type 2 diabetes mellitus with hyperglycemia: Secondary | ICD-10-CM

## 2021-03-24 LAB — GLUCOSE, CAPILLARY
Glucose-Capillary: 109 mg/dL — ABNORMAL HIGH (ref 70–99)
Glucose-Capillary: 215 mg/dL — ABNORMAL HIGH (ref 70–99)
Glucose-Capillary: 249 mg/dL — ABNORMAL HIGH (ref 70–99)
Glucose-Capillary: 396 mg/dL — ABNORMAL HIGH (ref 70–99)

## 2021-03-24 NOTE — Progress Notes (Signed)
Physical Therapy Session Note  Patient Details  Name: Jennifer [name] MRN: [id number] Date of Birth: Oct 03, 1970  Today's Date: 03/24/2021 PT Individual Time: 1000-1057 PT Individual Time Calculation (min): 57 min   Short Term Goals: Week 1:  PT Short Term Goal 1 (Week 1): pt will transfer bed>< wc with CGA PT Short Term Goal 2 (Week 1): pt will propel wc x 150' with supervision and cues for steering on level tile PT Short Term Goal 3 (Week 1): pt will move sit>< stand iwht CGA PT Short Term Goal 4 (Week 1): pt will perform gait with RW on level tile x 100', CGA PT Short Term Goal 5 (Week 1): pt will be assessed for bil AFOs  Skilled Therapeutic Interventions/Progress Updates:     Pt seated EOB to start session - interpreter at bedside to assist with communication. Husband and son also at bedside. Asked them to bring in regular socks from home and to provide WIDE fitting tennis shoes to allow better fitting for AFO's. All voiced understanding. Pt assisted to w/c via squat<>pivot transfer with minA from EOB to w/c. Pt transported downstairs to 41M rehab gym for time. Focused first part of session on blocked practice squat<>pivot transfers from w/c to mat table - required minA fading to CGA after x5 reps. Pt required question cues for w/c setup and sequencing as she's forgetful with managing arm rests or even the brakes. Use bright colored yellow coban to tape the "targets" for her to assist with remembering (brakes, leg rest levers, and arm rest levers). Pt then engaged in gait training with RW where she ambulated ~166ft with CGA/minA and RW - had x5 instances of R toe catching due to inadequate knee/hip flexion in swing to compensate for her bilateral foot drop. She c/o R ankle swelling and it feeling "heavy." Removed socks to assess swelling and there is some edema but nothing more than what has been present in previous sessions. Pt assisted to Nustep where she completed x5 minutes at workload 4,  fading to 3, using BLE's only to target leg muscle groups. She was able to do this without rest break and this is a big improvement as she was unable to do even a few steps on workload 1, 3 days ago. Pt assisted to w/c via squat<>pivot transfer with minA and then transported back upstairs to her room. Remained seated in w/c with safety belt alarm on, all her needs within reach, family remained at bedside.   Therapy Documentation Precautions:  Precautions Precautions: Fall (bil foot drop; need interpreter) Restrictions Weight Bearing Restrictions: No General:    Therapy/Group: Individual Therapy  Demri Poulton P Briellah Baik PT 03/24/2021, 7:32 AM

## 2021-03-24 NOTE — Progress Notes (Signed)
PROGRESS NOTE   Subjective/Complaints: Patient seen sitting up in bed this morning.  Family at bedside.  Family interprets.  She states she slept well overnight.  She notes left ankle pain yesterday, but resolution today.  ROS: Denies CP, SOB, N/V/D  Objective:   No results found. Recent Labs    03/23/21 0929 03/23/21 2056  WBC 4.2 4.8  HGB 8.6* 8.3*  HCT 28.2* 26.7*  PLT 171 176    No results for input(s): NA, K, CL, CO2, GLUCOSE, BUN, CREATININE, CALCIUM in the last 72 hours.   Intake/Output Summary (Last 24 hours) at 03/24/2021 1236 Last data filed at 03/24/2021 0900 Gross per 24 hour  Intake 354 ml  Output --  Net 354 ml         Physical Exam: Vital Signs Blood pressure 126/64, pulse 75, temperature 98.3 F (36.8 C), resp. rate 18, height 5\' 6"  (1.676 m), weight 76.1 kg, last menstrual period 08/21/2016, SpO2 96 %. Constitutional: No distress . Vital signs reviewed. HENT: Normocephalic.  Atraumatic. Eyes: Right eye extraocular movements intact. No discharge. Cardiovascular: No JVD.  Irregularly irregular. Respiratory: Normal effort.  No stridor.  Bilateral clear to auscultation. GI: Non-distended.  BS +. Skin: Warm and dry.  Intact. Psych: Normal mood.  Normal behavior. Musc: Bilateral lower extremity ankle edema, no tenderness. Neuro: Alert Bilateral lower extremity foot drop  Assessment/Plan: 1. Functional deficits which require 3+ hours per day of interdisciplinary therapy in a comprehensive inpatient rehab setting. Physiatrist is providing close team supervision and 24 hour management of active medical problems listed below. Physiatrist and rehab team continue to assess barriers to discharge/monitor patient progress toward functional and medical goals  Care Tool:  Bathing              Bathing assist Assist Level: Minimal Assistance - Patient > 75%     Upper Body Dressing/Undressing Upper  body dressing   What is the patient wearing?: Pull over shirt    Upper body assist Assist Level: Supervision/Verbal cueing    Lower Body Dressing/Undressing Lower body dressing      What is the patient wearing?: Pants     Lower body assist Assist for lower body dressing: Minimal Assistance - Patient > 75%     Toileting Toileting    Toileting assist Assist for toileting: Minimal Assistance - Patient > 75%     Transfers Chair/bed transfer  Transfers assist     Chair/bed transfer assist level: Minimal Assistance - Patient > 75%     Locomotion Ambulation   Ambulation assist      Assist level: Minimal Assistance - Patient > 75% Assistive device: Walker-rolling Max distance: 140ft   Walk 10 feet activity   Assist     Assist level: Minimal Assistance - Patient > 75% Assistive device: Walker-rolling   Walk 50 feet activity   Assist Walk 50 feet with 2 turns activity did not occur: Safety/medical concerns (fatigued)  Assist level: Minimal Assistance - Patient > 75% Assistive device: Walker-rolling    Walk 150 feet activity   Assist Walk 150 feet activity did not occur: Safety/medical concerns (fatigue)  Assist level: Minimal Assistance - Patient > 75% Assistive  device: Walker-rolling    Walk 10 feet on uneven surface  activity   Assist Walk 10 feet on uneven surfaces activity did not occur: Safety/medical concerns         Wheelchair     Assist Is the patient using a wheelchair?: Yes Type of Wheelchair: Manual    Wheelchair assist level: Supervision/Verbal cueing Max wheelchair distance: 114ft    Wheelchair 50 feet with 2 turns activity    Assist        Assist Level: Supervision/Verbal cueing   Wheelchair 150 feet activity     Assist      Assist Level: Supervision/Verbal cueing   Blood pressure 126/64, pulse 75, temperature 98.3 F (36.8 C), resp. rate 18, height 5\' 6"  (1.676 m), weight 76.1 kg, last menstrual  period 08/21/2016, SpO2 96 %.  Medical Problem List and Plan: 1.  Debility functional deficits secondary to proctitis/colitis/GI bleed/multi medical  Continue CIR 2.  Impaired mobility: continue SCDs at night as per daughter's request.  -DVT/anticoagulation:  Pharmaceutical: Other (comment) Eliquis             -antiplatelet therapy: N/A 3. Left shoulder pain and limited range of motion: MRI shows arthritis- ortho consulted for shoulder injection. Continue Neurontin 300 mg nightly  Encourage elevation of feet with ice to ankles at present-we will consider medications if necessary  Controlled with meds on 12/3 4. Mood: Provide emotional support             -antipsychotic agents: N/A 5. Neuropsych: This patient is capable of making decisions on her own behalf. 6. Skin/Wound Care: Routine skin checks 7. Fluids/Electrolytes/Nutrition: Routine in and outs 8.  Proctitis/colitis/GI bleed.  Follow-up GI Dr. Paulita Fujita.  Completed course of Omnicef and Flagyl.  Hemoccult positive. Per GI conservative management recommended for now.  Hemoglobin 8.3 on 12/2, some fluctuations,  labs ordered for Monday 9.  Prior CVA/abnormal MRI.  Recent admission 02/25/2021 to 03/05/2021.  Plan repeat MRI 4 weeks per neurology services 10.  Atrial fibrillation.  Cardiac rate controlled. Continue Eliquis. Magnesium level 1.9 on 11/28 11.  Hypertension.  Norvasc 2.5 mg twice daily.  Monitor with increased mobility  Improving control on 12/3 12.  Uncontrolled diabetes mellitus.  Hemoglobin A1c greater than 12.  Check blood sugars before meals and at bedtime.  Diabetic teaching CBG (last 3)  Recent Labs    03/23/21 2056 03/24/21 0604 03/24/21 1147  GLUCAP 289* 109* 215*   Increase Levemir to 12U Metformin XR 100mg  BID - will hold due to freq loose stools-appear to be improving D/c ISS due to hypoglycemia.  Extremely labile on 12/3, monitor for trend, no changes today given recent adjustments 13.  AKI versus CKD.   Placed nursing order to encourage 6-8 glasses of water per day  Creatinine 1.0 411/28, labs ordered for Monday 14.  Glaucoma.  Left eye blindness.  Follow Dr. Catalina [facility]igua [facility]  Continue eyedrops. She asked that Bromidine be discontinued due to burning. I have done so, and asked husband to call Dr. Manuella Ghazi to scheduled outpatient ophthalmology appointment for soon after she leaves here. Messaged Neoma Laming to help get husband permanent handicap placard on Monday.  15.  Urinary retention.  Foley tube removed.  Follow-up outpatient urology as needed 16.  Medical noncompliance.  Counseling 17. Overweight BMI 27.08: provide dietary education.  18. Bilateral lower extremity edema: recommended low sodium diet 19.  Diarrhea:?  Resolved  LOS: 7 days A FACE TO FACE EVALUATION WAS PERFORMED  Roxanna Mcever Lorie Phenix  03/24/2021, 12:36 PM

## 2021-03-24 NOTE — Progress Notes (Signed)
Occupational Therapy Session Note  Patient Details  Name: Jennifer [name] MRN: [id number] Date of Birth: 10-Apr-1971  Today's Date: 03/25/2021 OT Individual Time: 1450-1532 OT Individual Time Calculation (min): 42 min   Skilled Therapeutic Interventions/Progress Updates:    Pt greeted in bed, no s/s pain and agreeable to tx. No interpretor present so OT found a virtual interpretor and used that throughout session for communication needs. Total A to don shoes and then pt completed squat pivot<w/c with CGA. She was escorted via w/c to the ortho gym on 4th floor where she completed a lateral scoot transfer with close supervision. Worked on LE strengthening for functional transfer training at sit<stand level, pt completed sit<stands with CGA and marching in place using RW for standing support, vcs for higher marches and keeping Rt toes pointed, noted externally rotated hip. Pt throughout reports that her right leg felt "heavy." Visibly difficult for her to lift the Rt foot off of the floor. Discussed "lighter touch" on walker to increase LE weightbearing for strengthening purposes. While seated, guided pt through flutter kicks x1 minute sustained activity before rest. CGA for squat pivot<w/c after and then pt was returned to the room. She was agreeable to remain sitting up in the w/c, all needs within reach and safety belt fastened.    Therapy Documentation Precautions:  Precautions Precautions: Fall (bil foot drop; need interpreter) Restrictions Weight Bearing Restrictions: No  Pain: pt denied pain during tx   ADL: ADL Grooming: Setup Where Assessed-Grooming: Sitting at sink Upper Body Bathing:  (pt politely refused) Lower Body Bathing:  (pt politely refused) Upper Body Dressing: Supervision/safety Where Assessed-Upper Body Dressing: Edge of bed Lower Body Dressing: Minimal assistance Where Assessed-Lower Body Dressing: Edge of bed Toileting: Minimal assistance Where Assessed-Toileting:  Bedside Commode Toilet Transfer: Minimal assistance Toilet Transfer Method: Stand pivot Toilet Transfer Equipment: Bedside commode   Therapy/Group: Individual Therapy  Guerin Lashomb A Shley Dolby 03/25/2021, 4:44 PM

## 2021-03-25 DIAGNOSIS — N189 Chronic kidney disease, unspecified: Secondary | ICD-10-CM

## 2021-03-25 DIAGNOSIS — R7309 Other abnormal glucose: Secondary | ICD-10-CM

## 2021-03-25 LAB — GLUCOSE, CAPILLARY
Glucose-Capillary: 168 mg/dL — ABNORMAL HIGH (ref 70–99)
Glucose-Capillary: 189 mg/dL — ABNORMAL HIGH (ref 70–99)
Glucose-Capillary: 199 mg/dL — ABNORMAL HIGH (ref 70–99)
Glucose-Capillary: 370 mg/dL — ABNORMAL HIGH (ref 70–99)

## 2021-03-25 NOTE — Progress Notes (Signed)
PROGRESS NOTE   Subjective/Complaints: Patient seen sitting up in bed this AM.  Good sitting balance noted. No interpretor present.  Denies ankle pain.   ROS: Denies CP, SOB, N/V/D  Objective:   No results found. Recent Labs    03/23/21 0929 03/23/21 2056  WBC 4.2 4.8  HGB 8.6* 8.3*  HCT 28.2* 26.7*  PLT 171 176    No results for input(s): NA, K, CL, CO2, GLUCOSE, BUN, CREATININE, CALCIUM in the last 72 hours.   Intake/Output Summary (Last 24 hours) at 03/25/2021 1935 Last data filed at 03/25/2021 1849 Gross per 24 hour  Intake 716 ml  Output --  Net 716 ml         Physical Exam: Vital Signs Blood pressure (!) 149/63, pulse 83, temperature 97.9 F (36.6 C), resp. rate 15, height 5\' 6"  (1.676 m), weight 76.1 kg, last menstrual period 08/21/2016, SpO2 99 %. Constitutional: No distress . Vital signs reviewed. HENT: Normocephalic.  Atraumatic. Eyes: Right EOMI. No discharge. Cardiovascular: No JVD.  Irregularly irregular Respiratory: Normal effort.  No stridor.  Bilateral clear to auscultation. GI: Non-distended.  BS +. Skin: Warm and dry.  Intact. Psych: Normal mood.  Normal behavior. Musc: Bilateral ankle extremities.  No tenderness in extremities. Neuro: Alert Bilateral lower extremity foot drop, unchanged  Assessment/Plan: 1. Functional deficits which require 3+ hours per day of interdisciplinary therapy in a comprehensive inpatient rehab setting. Physiatrist is providing close team supervision and 24 hour management of active medical problems listed below. Physiatrist and rehab team continue to assess barriers to discharge/monitor patient progress toward functional and medical goals  Care Tool:  Bathing              Bathing assist Assist Level: Minimal Assistance - Patient > 75%     Upper Body Dressing/Undressing Upper body dressing   What is the patient wearing?: Pull over shirt    Upper  body assist Assist Level: Supervision/Verbal cueing    Lower Body Dressing/Undressing Lower body dressing      What is the patient wearing?: Pants     Lower body assist Assist for lower body dressing: Minimal Assistance - Patient > 75%     Toileting Toileting    Toileting assist Assist for toileting: Minimal Assistance - Patient > 75%     Transfers Chair/bed transfer  Transfers assist     Chair/bed transfer assist level: Minimal Assistance - Patient > 75%     Locomotion Ambulation   Ambulation assist      Assist level: Minimal Assistance - Patient > 75% Assistive device: Walker-rolling Max distance: 145ft   Walk 10 feet activity   Assist     Assist level: Minimal Assistance - Patient > 75% Assistive device: Walker-rolling   Walk 50 feet activity   Assist Walk 50 feet with 2 turns activity did not occur: Safety/medical concerns (fatigued)  Assist level: Minimal Assistance - Patient > 75% Assistive device: Walker-rolling    Walk 150 feet activity   Assist Walk 150 feet activity did not occur: Safety/medical concerns (fatigue)  Assist level: Minimal Assistance - Patient > 75% Assistive device: Walker-rolling    Walk 10 feet on uneven surface  activity   Assist Walk 10 feet on uneven surfaces activity did not occur: Safety/medical concerns         Wheelchair     Assist Is the patient using a wheelchair?: Yes Type of Wheelchair: Manual    Wheelchair assist level: Supervision/Verbal cueing Max wheelchair distance: 157ft    Wheelchair 50 feet with 2 turns activity    Assist        Assist Level: Supervision/Verbal cueing   Wheelchair 150 feet activity     Assist      Assist Level: Supervision/Verbal cueing   Blood pressure (!) 149/63, pulse 83, temperature 97.9 F (36.6 C), resp. rate 15, height 5\' 6"  (1.676 m), weight 76.1 kg, last menstrual period 08/21/2016, SpO2 99 %.  Medical Problem List and Plan: 1.   Debility functional deficits secondary to proctitis/colitis/GI bleed/multi medical  Continue CIR 2.  Impaired mobility: continue SCDs at night as per daughter's request.  -DVT/anticoagulation:  Pharmaceutical: Other (comment) Eliquis             -antiplatelet therapy: N/A 3. Left shoulder pain and limited range of motion: MRI shows arthritis- ortho consulted for shoulder injection. Continue Neurontin 300 mg nightly  Encourage elevation of feet with ice to ankles at present-we will consider medications if necessary  Controlled with meds on 12/4 4. Mood: Provide emotional support             -antipsychotic agents: N/A 5. Neuropsych: This patient is capable of making decisions on her own behalf. 6. Skin/Wound Care: Routine skin checks 7. Fluids/Electrolytes/Nutrition: Routine in and outs 8.  Proctitis/colitis/GI bleed.  Follow-up GI Dr. Paulita Fujita.  Completed course of Omnicef and Flagyl.  Hemoccult positive. Per GI conservative management recommended for now.  Hemoglobin 8.3 on 12/2, some fluctuations, labs ordered for tomorrow 9.  Prior CVA/abnormal MRI.  Recent admission 02/25/2021 to 03/05/2021.  Plan repeat MRI 4 weeks per neurology services 10.  Atrial fibrillation.  Cardiac rate controlled. Continue Eliquis. Magnesium level 1.9 on 11/28 11.  Hypertension.  Norvasc 2.5 mg twice daily.  Monitor with increased mobility  Mildly elevated on 12/4 12.  Uncontrolled diabetes mellitus.  Hemoglobin A1c greater than 12.  Check blood sugars before meals and at bedtime.  Diabetic teaching CBG (last 3)  Recent Labs    03/25/21 0551 03/25/21 1155 03/25/21 1623  GLUCAP 168* 189* 199*   Increase Levemir to 12U Metformin XR 100mg  BID - will hold due to freq loose stools-appear to be improving D/c ISS due to hypoglycemia.  Labile, but ?stabilizing on 12/4 13.  AKI versus CKD.  Placed nursing order to encourage 6-8 glasses of water per day  Creatinine 1.04 on 11/28, labs ordered for tomorrow 14.   Glaucoma.  Left eye blindness.  Follow Dr. Catalina Antigua [facility].  Continue eyedrops. She asked that Bromidine be discontinued due to burning. I have done so, and asked husband to call Dr. Manuella Ghazi to scheduled outpatient ophthalmology appointment for soon after she leaves here. Messaged Neoma Laming to help get husband permanent handicap placard on Monday.  15.  Urinary retention.  Foley tube removed.  Follow-up outpatient urology as needed 16.  Medical noncompliance.  Counseling 17. Overweight BMI 27.08: provide dietary education.  18. Bilateral lower extremity edema: recommended low sodium diet 19.  Diarrhea:?  Resolved  LOS: 8 days A FACE TO FACE EVALUATION WAS PERFORMED  Avis Tirone Lorie Phenix 03/25/2021, 7:35 PM

## 2021-03-26 ENCOUNTER — Other Ambulatory Visit: Payer: Self-pay

## 2021-03-26 LAB — BASIC METABOLIC PANEL
Anion gap: 8 (ref 5–15)
BUN: 28 mg/dL — ABNORMAL HIGH (ref 6–20)
CO2: 24 mmol/L (ref 22–32)
Calcium: 9 mg/dL (ref 8.9–10.3)
Chloride: 105 mmol/L (ref 98–111)
Creatinine, Ser: 0.97 mg/dL (ref 0.44–1.00)
GFR, Estimated: >60 mL/min (ref >60)
Glucose, Bld: 210 mg/dL — ABNORMAL HIGH (ref 70–99)
Potassium: 4.4 mmol/L (ref 3.5–5.1)
Sodium: 137 mmol/L (ref 135–145)

## 2021-03-26 LAB — CBC WITH DIFFERENTIAL/PLATELET
Abs Immature Granulocytes: 0.01 10*3/uL (ref 0.00–0.07)
Basophils Absolute: 0 10*3/uL (ref 0.0–0.1)
Basophils Relative: 0 %
Eosinophils Absolute: 0.3 10*3/uL (ref 0.0–0.5)
Eosinophils Relative: 5 %
HCT: 27 % — ABNORMAL LOW (ref 36.0–46.0)
Hemoglobin: 8.4 g/dL — ABNORMAL LOW (ref 12.0–15.0)
Immature Granulocytes: 0 %
Lymphocytes Relative: 43 %
Lymphs Abs: 2 10*3/uL (ref 0.7–4.0)
MCH: 27.5 pg (ref 26.0–34.0)
MCHC: 31.1 g/dL (ref 30.0–36.0)
MCV: 88.5 fL (ref 80.0–100.0)
Monocytes Absolute: 0.3 10*3/uL (ref 0.1–1.0)
Monocytes Relative: 7 %
Neutro Abs: 2.1 10*3/uL (ref 1.7–7.7)
Neutrophils Relative %: 45 %
Platelets: 166 10*3/uL (ref 150–400)
RBC: 3.05 MIL/uL — ABNORMAL LOW (ref 3.87–5.11)
RDW: 13.2 % (ref 11.5–15.5)
WBC: 4.7 10*3/uL (ref 4.0–10.5)
nRBC: 0 % (ref 0.0–0.2)

## 2021-03-26 LAB — GLUCOSE, CAPILLARY
Glucose-Capillary: 143 mg/dL — ABNORMAL HIGH (ref 70–99)
Glucose-Capillary: 163 mg/dL — ABNORMAL HIGH (ref 70–99)
Glucose-Capillary: 257 mg/dL — ABNORMAL HIGH (ref 70–99)
Glucose-Capillary: 292 mg/dL — ABNORMAL HIGH (ref 70–99)

## 2021-03-26 MED ORDER — AMLODIPINE BESYLATE 10 MG PO TABS
10.0000 mg | ORAL_TABLET | Freq: Every day | ORAL | Status: DC
  Administered 2021-03-27 – 2021-03-29 (×3): 10 mg via ORAL
  Filled 2021-03-26 (×3): qty 1

## 2021-03-26 MED ORDER — AMLODIPINE BESYLATE 5 MG PO TABS
7.5000 mg | ORAL_TABLET | Freq: Once | ORAL | Status: AC
  Administered 2021-03-26: 7.5 mg via ORAL
  Filled 2021-03-26: qty 1

## 2021-03-26 MED ORDER — INSULIN DETEMIR 100 UNIT/ML ~~LOC~~ SOLN
13.0000 [IU] | Freq: Every day | SUBCUTANEOUS | Status: DC
Start: 2021-03-26 — End: 2021-03-27
  Administered 2021-03-26: 13 [IU] via SUBCUTANEOUS
  Filled 2021-03-26 (×2): qty 0.13

## 2021-03-26 NOTE — Progress Notes (Signed)
PROGRESS NOTE   Subjective/Complaints: No new complaints this morning. Increasing amlodipine to 10mg  daily for elevated Bps- appreciate pharmacy input Will increase levemir to 13U for elevated CBGs  ROS: Denies CP, SOB, N/V/D  Objective:   No results found. Recent Labs    03/23/21 2056  WBC 4.8  HGB 8.3*  HCT 26.7*  PLT 176   No results for input(s): NA, K, CL, CO2, GLUCOSE, BUN, CREATININE, CALCIUM in the last 72 hours.   Intake/Output Summary (Last 24 hours) at 03/26/2021 0954 Last data filed at 03/26/2021 0809 Gross per 24 hour  Intake 676 ml  Output --  Net 676 ml        Physical Exam: Vital Signs Blood pressure (!) 153/74, pulse 76, temperature 98.2 F (36.8 C), temperature source Oral, resp. rate 18, height 5\' 6"  (1.676 m), weight 76.1 kg, last menstrual period 08/21/2016, SpO2 100 %. Gen: no distress, normal appearing HEENT: oral mucosa pink and moist, NCAT Cardio: Reg rate Chest: normal effort, normal rate of breathing Abd: soft, non-distended Ext: no edema Psych: pleasant, normal affect Skin: Warm and dry.  Intact. Psych: Normal mood.  Normal behavior. Musc: Bilateral ankle extremities.  No tenderness in extremities. Neuro: Alert Bilateral lower extremity foot drop, unchanged  Assessment/Plan: 1. Functional deficits which require 3+ hours per day of interdisciplinary therapy in a comprehensive inpatient rehab setting. Physiatrist is providing close team supervision and 24 hour management of active medical problems listed below. Physiatrist and rehab team continue to assess barriers to discharge/monitor patient progress toward functional and medical goals  Care Tool:  Bathing              Bathing assist Assist Level: Minimal Assistance - Patient > 75%     Upper Body Dressing/Undressing Upper body dressing   What is the patient wearing?: Pull over shirt    Upper body assist Assist  Level: Supervision/Verbal cueing    Lower Body Dressing/Undressing Lower body dressing      What is the patient wearing?: Pants     Lower body assist Assist for lower body dressing: Minimal Assistance - Patient > 75%     Toileting Toileting    Toileting assist Assist for toileting: Minimal Assistance - Patient > 75%     Transfers Chair/bed transfer  Transfers assist     Chair/bed transfer assist level: Minimal Assistance - Patient > 75%     Locomotion Ambulation   Ambulation assist      Assist level: Minimal Assistance - Patient > 75% Assistive device: Walker-rolling Max distance: 136ft   Walk 10 feet activity   Assist     Assist level: Minimal Assistance - Patient > 75% Assistive device: Walker-rolling   Walk 50 feet activity   Assist Walk 50 feet with 2 turns activity did not occur: Safety/medical concerns (fatigued)  Assist level: Minimal Assistance - Patient > 75% Assistive device: Walker-rolling    Walk 150 feet activity   Assist Walk 150 feet activity did not occur: Safety/medical concerns (fatigue)  Assist level: Minimal Assistance - Patient > 75% Assistive device: Walker-rolling    Walk 10 feet on uneven surface  activity   Assist Walk 10 feet  on uneven surfaces activity did not occur: Safety/medical concerns         Wheelchair     Assist Is the patient using a wheelchair?: Yes Type of Wheelchair: Manual    Wheelchair assist level: Supervision/Verbal cueing Max wheelchair distance: 162ft    Wheelchair 50 feet with 2 turns activity    Assist        Assist Level: Supervision/Verbal cueing   Wheelchair 150 feet activity     Assist      Assist Level: Supervision/Verbal cueing   Blood pressure (!) 153/74, pulse 76, temperature 98.2 F (36.8 C), temperature source Oral, resp. rate 18, height 5\' 6"  (1.676 m), weight 76.1 kg, last menstrual period 08/21/2016, SpO2 100 %.  Medical Problem List and  Plan: 1.  Debility functional deficits secondary to proctitis/colitis/GI bleed/multi medical  Continue CIR 2.  Impaired mobility: continue SCDs at night as per daughter's request.  -DVT/anticoagulation:  Pharmaceutical: Other (comment) Eliquis             -antiplatelet therapy: N/A 3. Left shoulder pain and limited range of motion: MRI shows arthritis- ortho consulted for shoulder injection but decided to continue exercise at this time as this appears to be helping. Continue Neurontin 300 mg nightly  Encourage elevation of feet with ice to ankles at present-we will consider medications if necessary  Controlled with meds on 12/4 4. Mood: Provide emotional support             -antipsychotic agents: N/A 5. Neuropsych: This patient is capable of making decisions on her own behalf. 6. Skin/Wound Care: Routine skin checks 7. Fluids/Electrolytes/Nutrition: Routine in and outs 8.  Proctitis/colitis/GI bleed.  Follow-up GI Dr. Paulita Fujita.  Completed course of Omnicef and Flagyl.  Hemoccult positive. Per GI conservative management recommended for now.  Hemoglobin 8.3 on 12/2, some fluctuations, labs ordered for tomorrow 9.  Prior CVA/abnormal MRI.  Recent admission 02/25/2021 to 03/05/2021.  Plan repeat MRI 4 weeks per neurology services 10.  Atrial fibrillation.  Cardiac rate controlled. Continue Eliquis. Magnesium level 1.9 on 11/28 11.  Hypertension.  Increase amlodipine to 10mg  12.  Uncontrolled diabetes mellitus.  Hemoglobin A1c greater than 12.  Check blood sugars before meals and at bedtime.  Diabetic teaching CBG (last 3)  Recent Labs    03/25/21 1623 03/25/21 2043 03/26/21 0624  GLUCAP 199* 370* 143*  Increase Levemir to 13U Metformin XR 100mg  BID - will hold due to freq loose stools-appear to be improving D/c ISS due to hypoglycemia.  13.  AKI versus CKD.  Placed nursing order to encourage 6-8 glasses of water per day  Creatinine 1.04 on 11/28, labs ordered for tomorrow 14.  Glaucoma.   Left eye blindness.  Follow Dr. Catalina Antigua [facility].  Continue eyedrops. She asked that Bromidine be discontinued due to burning. I have done so, and asked husband to call Dr. Manuella Ghazi to scheduled outpatient ophthalmology appointment for soon after she leaves here. Messaged Neoma Laming to help get husband permanent handicap placard on Monday.  15.  Urinary retention.  Foley tube removed.  Follow-up outpatient urology as needed 16.  Medical noncompliance.  Counseling 17. Overweight BMI 27.08: provide dietary education.  18. Bilateral lower extremity edema: recommended low sodium diet 19.  Diarrhea:?  Resolved  LOS: 9 days A FACE TO FACE EVALUATION WAS PERFORMED  Clide Deutscher Mounir Skipper 03/26/2021, 9:54 AM

## 2021-03-26 NOTE — Progress Notes (Signed)
Occupational Therapy Weekly Progress Note  Patient Details  Name: Jennifer [name] MRN: [id number] Date of Birth: 11/13/1970  Beginning of progress report period: March 18, 2021 End of progress report period: March 26, 2021  Today's Date: 03/26/2021 OT Individual Time: 0900-1000 OT Individual Time Calculation (min): 60 min    Patient has met 2 of 4 short term goals.  Pt making steady progress and very motivated to participate and improve.  Barriers include impaired safety awareness and problem solving which demands repetitive training to encourage carryover of skills from session to session.  Pt is showing increased independence with squat pivot transfers and seated level self care now requiring only supervision to CGA. Pt will be home alone intermittently throughout the day and therefore needs to be able to complete toileting and functional transfers at mod I level and will benefit from further training to ensure safe discharge to home.     Patient continues to demonstrate the following deficits: muscle weakness, decreased cardiorespiratoy endurance, decreased coordination and decreased motor planning, decreased awareness, decreased problem solving, and decreased safety awareness, and decreased sitting balance, decreased standing balance, and hemiplegia and therefore will continue to benefit from skilled OT intervention to enhance overall performance with BADL.  Patient progressing toward long term goals..  Continue plan of care.  OT Short Term Goals Week 1:  OT Short Term Goal 1 (Week 1): Pt will complete toileting with supervision OT Short Term Goal 1 - Progress (Week 1): Met OT Short Term Goal 2 (Week 1): Pt will complete stand pivot to BSC with CGA OT Short Term Goal 2 - Progress (Week 1): Partly met (CGA however completing at squat pivot level for safety) OT Short Term Goal 3 (Week 1): Pt will dress LB with supervision OT Short Term Goal 3 - Progress (Week 1):  (min assist including  donning/doffing bilateral AFOs) OT Short Term Goal 4 (Week 1): Pt will bathe UB/LB with CGA OT Short Term Goal 4 - Progress (Week 1): Met Week 2:  OT Short Term Goal 1 (Week 2): STGs=LTGs due to ELOS  Skilled Therapeutic Interventions/Progress Updates:    Pt semi reclined in bed, reports just a small amount of pain and heaviness in bilateral feet but agreeable to OT session and no medication needs at this time per pt.  Interpreter present during session for assist with communication as needed.  Collaborated with pt regarding POC and pts progress since IE.  Pt reporting she feels much safe completing toileting using bariatric commode and thankful to therapist for teaching her this technique.  Pt provided pictures of bathroom and noted standard tub/shower present with room for shower bench.  Orthotics specialist arrived and donned new AFO bilaterally to check for fit.  Pt asked to complete functional mobility to assess comfort and support and required close supervision for sit<>stand EOB and ambulation in hallway approximately 20 feet.  Orthotics specialist departed and therapist educated pt on donning/doffing technique for bilateral shoes and AFO using figure 4 and sitting EOB.  Pt able to donn/doff both with min assist and step by step cues.  Pt completed squat pivot to w/c with close supervision and step by step cues for body positioning and w/c mgt.  Direct hand off to PT.  Therapy Documentation Precautions:  Precautions Precautions: Fall (bil foot drop; need interpreter) Restrictions Weight Bearing Restrictions: No   Therapy/Group: Individual Therapy  Bonnie L Elsdon 03/26/2021, 9:07 AM  

## 2021-03-26 NOTE — Progress Notes (Signed)
Occupational Therapy Session Note  Patient Details  Name: Jennifer [name] MRN: [id number] Date of Birth: 12/13/1970  Today's Date: 03/26/2021 OT Individual Time: 1300-1410 OT Individual Time Calculation (min): 70 min    Short Term Goals: Week 2:  OT Short Term Goal 1 (Week 2): STGs=LTGs due to ELOS  Skilled Therapeutic Interventions/Progress Updates:    Pt resting in bed upon arrival with interpreter present. OT intervention with focus on bed mobility, sit<>stand, standing balance, BUE PROM and AROM, functional amb with RW, and safety awareness to increase independence with BADLs. Supine>sit EOB with supervision. Sit<>stand from EOB and amb in room with RW at [facility]. Min verbal cues for RW positoining when sitting down in w/c. Lt shoulder PROM/AROM and 5 mins on Scifit level 2 for pain mgmt. Pt reported some relief. Pt amb with RW to ADL apartment and attempted sit>supine on ADL bed. Pt unable to complete task. Pt practiced sit<>stand from sofa. Sit>stand with max A from lower surface. All amb with CGA and min verbal cues for safety awareness and RW safety. Pt returned to room and transferred to EOB. Pt remained in bed with bed alarm activated. All needs within reach.   Therapy Documentation Precautions:  Precautions Precautions: Fall (bil foot drop; need interpreter) Restrictions Weight Bearing Restrictions: No  Pain:  Pt reports Lt lateral thoracic rib pain relieved with soft tissue mobs and PROM ADL: ADL Grooming: Setup Where Assessed-Grooming: Sitting at sink Upper Body Bathing:  (pt politely refused) Lower Body Bathing:  (pt politely refused) Upper Body Dressing: Supervision/safety Where Assessed-Upper Body Dressing: Edge of bed Lower Body Dressing: Minimal assistance Where Assessed-Lower Body Dressing: Edge of bed Toileting: Minimal assistance Where Assessed-Toileting: Bedside Commode Toilet Transfer: Minimal assistance Toilet Transfer Method: Education officer, museum: Forensic psychologist   Balance   Exercises:   Other Treatments:     Therapy/Group: Individual Therapy  Leroy Libman 03/26/2021, 2:14 PM

## 2021-03-26 NOTE — Progress Notes (Signed)
Ok to increase amlidipine to 10mg  qday per Dr. Ranell Patrick.  Onnie Boer, PharmD, BCIDP, AAHIVP, CPP Infectious Disease Pharmacist 03/26/2021 9:51 AM

## 2021-03-26 NOTE — Progress Notes (Signed)
Physical Therapy Weekly Progress Note  Patient Details  Name: Jennifer [name] MRN: [id number] Date of Birth: July 22, 1970  Beginning of progress report period: March 18, 2021 End of progress report period: March 26, 2021  Today's Date: 03/26/2021 PT Individual Time: 1004-1100 PT Individual Time Calculation (min): 56 min   Patient has met 3 of 5 short term goals. Pt is making slow, but appropriate, progress towards goals this reporting period. Overall, she requires minA at times for bed mobility due to BLE weakness, minA for squat<>pivot transfers, CGA to minA for sit<>stand transfers to RW, CGA for gait up to 118f with RW (minA for gait >1525f, and requires modA for navigating 4inch curb with RW. She can propel herself with supervision in w/c up to 10035fShe has been assessed for bilateral AFO's and these are planned to be delivered this week. Educated family and pt on shoe wear for these and they are bringing in shoes from home. Language barrier has been limiting at times, but an in-house interpreter is present for most sessions.   Patient continues to demonstrate the following deficits muscle weakness and muscle joint tightness, decreased cardiorespiratoy endurance, unbalanced muscle activation, motor apraxia, and decreased coordination, blind in L eye, decreased awareness, decreased problem solving, decreased safety awareness, and decreased memory, and decreased sitting balance, decreased standing balance, decreased postural control, hemiplegia, and decreased balance strategies and therefore will continue to benefit from skilled PT intervention to increase functional independence with mobility.  Patient progressing toward long term goals..  Continue plan of care.  PT Short Term Goals Week 1:  PT Short Term Goal 1 (Week 1): pt will transfer bed>< wc with CGA PT Short Term Goal 2 (Week 1): pt will propel wc x 150' with supervision and cues for steering on level tile PT Short Term Goal 3  (Week 1): pt will move sit>< stand iwht CGA PT Short Term Goal 4 (Week 1): pt will perform gait with RW on level tile x 100', CGA PT Short Term Goal 5 (Week 1): pt will be assessed for bil AFOs Week 2:  PT Short Term Goal 1 (Week 2): Pt will complete bed mobility with supervision PT Short Term Goal 2 (Week 2): Pt will complete bed<>chair transfers with CGA consistently PT Short Term Goal 3 (Week 2): Pt will ambulate [id number] CGA and LRAD PT Short Term Goal 4 (Week 2): Pt will navigate curb with RW and minA  Skilled Therapeutic Interventions/Progress Updates:     Pt greeted seated in w/c with handoff of care from OT at start of session. Interpreter at bedside to assist with communication during session. Pt denies pain. She now has her bilateral AFO's delivered. AFO's are [employer] with lateral struts. Pt reports no discomfort or pain from AFO's during rest or while walking. Ed on daily skin checks at home. Pt propelled herself with supervision using BUE's in w/c from her room to 5C [facility], ~[id number] with steering straight, requiring cues for symmetrical propulsion. Transported remaining distance to 48M rehab gym for time and energy conservation. Completed squat<>pivot transfer with CGA from w/c to mat table, requiring question cues for w.c setup - good carryover from colored tape as targets from previous session. Pt instructed in gait training where she ambulated 100ft 62f5ft +15ft (s28fd rests) with CGA and RW - demo's improved B foot clearance with AFO's on with appropriate hip/knee flexion in swing to avoid toe catching. Pt instructed in 5xSTS untimed from arm chair (relying on arm's  to assist in pushing to stand) - she's able to complete x4 reps consecutively prior to fatigue, very effortful. Pt then instructed in standing toe taps to 4inch platform with RW support and CGA - able to clear LLE 3x4 reps but unable to clear her weaker RLE. When asked about home entrance, she  reports that she has a 3-4 inch threshold to clear - she relies on her kids and husband to assist with this and has been requiring assist for the past 9 months since her ankle surgery - will confirm with family at later date. When transferring back to her w/c from mat table, pt with poor safety awareness with RW management, leaving it prior to sitting - she reports this occurs when she's tired and educated on importance of continued safety even when fatigued. Will provided energy conservation ed next session. Pt transported back upstairs to her room where she remained seated in w/c with safety belt alarm on, all needs in reach.    Therapy Documentation Precautions:  Precautions Precautions: Fall (bil foot drop; need interpreter) Restrictions Weight Bearing Restrictions: No General:    Therapy/Group: Individual Therapy  Alger Simons 03/26/2021, 7:47 AM

## 2021-03-26 NOTE — Patient Outreach (Signed)
Care Coordination  03/26/2021  Jennifer [name] 04/29/1970 [id number]  Naarah Borgerding is currently admitted to  inpatient rehabilitation  at [facility] team will follow the progress of Jennifer [name] and follow up upon discharge.   [name] RN, BSN [place]  [employer] - Managed Medicaid High Risk [phone].

## 2021-03-27 LAB — GLUCOSE, CAPILLARY
Glucose-Capillary: 92 mg/dL (ref 70–99)
Glucose-Capillary: 172 mg/dL — ABNORMAL HIGH (ref 70–99)
Glucose-Capillary: 283 mg/dL — ABNORMAL HIGH (ref 70–99)
Glucose-Capillary: 336 mg/dL — ABNORMAL HIGH (ref 70–99)

## 2021-03-27 MED ORDER — INSULIN DETEMIR 100 UNIT/ML ~~LOC~~ SOLN
14.0000 [IU] | Freq: Every day | SUBCUTANEOUS | Status: DC
  Administered 2021-03-27: 14 [IU] via SUBCUTANEOUS
  Filled 2021-03-27 (×2): qty 0.14

## 2021-03-27 NOTE — Progress Notes (Signed)
Occupational Therapy Session Note  Patient Details  Name: Synethia Endicott MRN: [id number] Date of Birth: Sep 07, 1970  Today's Date: 03/27/2021 OT Individual Time: 1015-1055 OT Individual Time Calculation (min): 40 min    Short Term Goals: Week 2:  OT Short Term Goal 1 (Week 2): STGs=LTGs due to ELOS  Skilled Therapeutic Interventions/Progress Updates:    Pt resting in w/c upon arrival with interpreter present. OT intervention with focus on standing balance, activity tolerance, and safety awareness. Standing balance activity included standing at table to grasp and place clothes pins on vertical dowel. Pt with limited grasp strength but able to complete task with modified grasp. Pt required rest break x2. CGA for standing balance. Pt states she has difficulty opening water bottles. Pt provided with dycem and educated on use. Pt returned to room and remained in w/c with all needs within reach. Belt alarm activated.   Therapy Documentation Precautions:  Precautions Precautions: Fall (bil foot drop; need interpreter) Restrictions Weight Bearing Restrictions: No  Pain: Pt reports some discomfort Lt lateral rib cage   Therapy/Group: Individual Therapy  Leroy Libman 03/27/2021, 12:21 PM

## 2021-03-27 NOTE — Progress Notes (Signed)
PROGRESS NOTE   Subjective/Complaints: No new complaints this morning Appreciate diabetes coordinator recs.  Given her hypoglycemia, will go up on her Levemir to 14U  ROS: Denies CP, SOB, N/V/D  Objective:   No results found. Recent Labs    03/26/21 1215  WBC 4.7  HGB 8.4*  HCT 27.0*  PLT 166   Recent Labs    03/26/21 1215  NA 137  K 4.4  CL 105  CO2 24  GLUCOSE 210*  BUN 28*  CREATININE 0.97  CALCIUM 9.0     Intake/Output Summary (Last 24 hours) at 03/27/2021 1202 Last data filed at 03/27/2021 0711 Gross per 24 hour  Intake 480 ml  Output --  Net 480 ml        Physical Exam: Vital Signs Blood pressure (!) 151/75, pulse 74, temperature 97.9 F (36.6 C), temperature source Oral, resp. rate 18, height 5\' 6"  (1.676 m), weight 76.1 kg, last menstrual period 08/21/2016, SpO2 100 %. Gen: no distress, normal appearing HEENT: oral mucosa pink and moist, NCAT Cardio: Reg rate Chest: normal effort, normal rate of breathing Abd: soft, non-distended Ext: no edema Psych: pleasant, normal affect Skin: Warm and dry.  Intact. Psych: Normal mood.  Normal behavior. Musc: Bilateral ankle extremities.  No tenderness in extremities. Neuro: Alert Bilateral lower extremity foot drop, unchanged  Assessment/Plan: 1. Functional deficits which require 3+ hours per day of interdisciplinary therapy in a comprehensive inpatient rehab setting. Physiatrist is providing close team supervision and 24 hour management of active medical problems listed below. Physiatrist and rehab team continue to assess barriers to discharge/monitor patient progress toward functional and medical goals  Care Tool:  Bathing              Bathing assist Assist Level: Minimal Assistance - Patient > 75%     Upper Body Dressing/Undressing Upper body dressing   What is the patient wearing?: Pull over shirt    Upper body assist Assist Level:  Supervision/Verbal cueing    Lower Body Dressing/Undressing Lower body dressing      What is the patient wearing?: Pants     Lower body assist Assist for lower body dressing: Minimal Assistance - Patient > 75%     Toileting Toileting    Toileting assist Assist for toileting: Minimal Assistance - Patient > 75%     Transfers Chair/bed transfer  Transfers assist     Chair/bed transfer assist level: Minimal Assistance - Patient > 75%     Locomotion Ambulation   Ambulation assist      Assist level: Minimal Assistance - Patient > 75% Assistive device: Walker-rolling Max distance: 172ft   Walk 10 feet activity   Assist     Assist level: Minimal Assistance - Patient > 75% Assistive device: Walker-rolling   Walk 50 feet activity   Assist Walk 50 feet with 2 turns activity did not occur: Safety/medical concerns (fatigued)  Assist level: Minimal Assistance - Patient > 75% Assistive device: Walker-rolling    Walk 150 feet activity   Assist Walk 150 feet activity did not occur: Safety/medical concerns (fatigue)  Assist level: Minimal Assistance - Patient > 75% Assistive device: Walker-rolling    Walk  10 feet on uneven surface  activity   Assist Walk 10 feet on uneven surfaces activity did not occur: Safety/medical concerns         Wheelchair     Assist Is the patient using a wheelchair?: Yes Type of Wheelchair: Manual    Wheelchair assist level: Supervision/Verbal cueing Max wheelchair distance: 135ft    Wheelchair 50 feet with 2 turns activity    Assist        Assist Level: Supervision/Verbal cueing   Wheelchair 150 feet activity     Assist      Assist Level: Supervision/Verbal cueing   Blood pressure (!) 151/75, pulse 74, temperature 97.9 F (36.6 C), temperature source Oral, resp. rate 18, height 5\' 6"  (1.676 m), weight 76.1 kg, last menstrual period 08/21/2016, SpO2 100 %.  Medical Problem List and Plan: 1.   Debility functional deficits secondary to proctitis/colitis/GI bleed/multi medical  Continue CIR 2.  Impaired mobility: continue SCDs at night as per daughter's request.  -DVT/anticoagulation:  Pharmaceutical: Other (comment) Eliquis             -antiplatelet therapy: N/A 3. Left shoulder pain and limited range of motion: MRI shows arthritis- ortho consulted for shoulder injection but decided to continue exercise at this time as this appears to be helping. Continue Neurontin 300 mg nightly  Encourage elevation of feet with ice to ankles at present-we will consider medications if necessary 4. Mood: Provide emotional support             -antipsychotic agents: N/A 5. Neuropsych: This patient is capable of making decisions on her own behalf. 6. Skin/Wound Care: Routine skin checks 7. Fluids/Electrolytes/Nutrition: Routine in and outs 8.  Proctitis/colitis/GI bleed.  Follow-up GI Dr. Paulita Fujita.  Completed course of Omnicef and Flagyl.  Hemoccult positive. Per GI conservative management recommended for now.  Hemoglobin 8.3 on 12/2, some fluctuations, labs ordered for tomorrow 9.  Prior CVA/abnormal MRI.  Recent admission 02/25/2021 to 03/05/2021.  Plan repeat MRI 4 weeks per neurology services 10.  Atrial fibrillation.  Cardiac rate controlled. Continue Eliquis. Magnesium level 1.9 on 11/28 11.  Hypertension.  Increase amlodipine to 10mg  12.  Uncontrolled diabetes mellitus.  Hemoglobin A1c greater than 12.  Check blood sugars before meals and at bedtime.  Diabetic teaching CBG (last 3)  Recent Labs    03/26/21 2100 03/27/21 0631 03/27/21 1129  GLUCAP 292* 92 172*  Increase Levemir to 14U Metformin XR 100mg  BID - will hold due to freq loose stools-appear to be improving D/c ISS due to hypoglycemia.  13.  AKI versus CKD.  Placed nursing order to encourage 6-8 glasses of water per day  Creatinine 1.04 on 11/28, 0.97 on 12/5, monitor as needed 14.  Glaucoma.  Left eye blindness.  Follow Dr. Catalina Antigua  [facility].  Continue eyedrops. She asked that Bromidine be discontinued due to burning. I have done so, and asked husband to call Dr. Manuella Ghazi to scheduled outpatient ophthalmology appointment for soon after she leaves here. Messaged Neoma Laming to help get husband permanent handicap placard on Monday.  15.  Urinary retention.  Foley tube removed.  Follow-up outpatient urology as needed 16.  Medical noncompliance.  Counseling 17. Overweight BMI 27.08: provide dietary education.  18. Bilateral lower extremity edema: recommended low sodium diet 19.  Diarrhea:?  Resolved  LOS: 10 days A FACE TO FACE EVALUATION WAS PERFORMED  Martha Clan P Delisia Mcquiston 03/27/2021, 12:02 PM

## 2021-03-27 NOTE — Progress Notes (Signed)
Inpatient Diabetes Program Recommendations  AACE/ADA: New Consensus Statement on Inpatient Glycemic Control (2015)  Target Ranges:  Prepandial:   less than 140 mg/dL      Peak postprandial:   less than 180 mg/dL (1-2 hours)      Critically ill patients:  140 - 180 mg/dL   Lab Results  Component Value Date   GLUCAP 92 03/27/2021   HGBA1C 12.1 (H) 02/03/2021    Review of Glycemic Control  Latest Reference Range & Units 03/26/21 16:47 03/26/21 21:00 03/27/21 06:31  Glucose-Capillary 70 - 99 mg/dL 257 (H) 292 (H) 92  (H): Data is abnormally high Diabetes history: Type 2 Dm Outpatient Diabetes medications: Levemir 15 units QHS, Novolog 3 units TID, Tradjenta 5 mg QD, Metformin 1000 mg BID Current orders for Inpatient glycemic control: Levmemir 13 units QHS, Tradjenta 5 mg QD  Inpatient Diabetes Program Recommendations:    Consider adding Novolog 0-6 units TID & HS and Novolog 3 units TID (Assuming patient is consuming >50% of meals).   Thanks, Bronson Curb, MSN, RNC-OB Diabetes Coordinator [phone] (8a-5p)

## 2021-03-27 NOTE — Progress Notes (Signed)
Physical Therapy Session Note  Patient Details  Name: Jennifer [name] MRN: [id number] Date of Birth: April 10, 1971  Today's Date: 03/27/2021 PT Individual Time: 4536-4680 + 3212-2482 PT Individual Time Calculation (min): 72 min  + 27 min  Short Term Goals: Week 2:  PT Short Term Goal 1 (Week 2): Pt will complete bed mobility with supervision PT Short Term Goal 2 (Week 2): Pt will complete bed<>chair transfers with CGA consistently PT Short Term Goal 3 (Week 2): Pt will ambulate 124ft with CGA and LRAD PT Short Term Goal 4 (Week 2): Pt will navigate curb with RW and minA  Skilled Therapeutic Interventions/Progress Updates:     1st session: Pt supine in bed sleeping on arrival - awakens to voice and agreeable to PT tx. No reports of pain. Interpreter present for session to assist with communication. Supine<>sit with supervision with HOB elevated. Completed squat<>pivot transfer with CGA from EOB to w/c. Required maxA for donning B shoes with AFO's - pt able to manage strap only. Propelled herself with supervision in hallways, ~184ft - continues to struggle with maintaining straight path with frequent veering L. Transported remaining distance to 45M rehab gym for time and energy conservation. Cut extra length of AFO's straps to fit. Instructed in gait training where she ambulated 176ft + 171ft with supervision and RW. Demo's forward flexed trunk with heavy reliance of UE through RW, wide BOS, and R foot externally rotated. No knee buckling or LOB observed. Pt fatigues after ~93ft of gait, requires a few brief standing rest breaks 2/2 BLE fatigue. Practiced curb training with 4inch platform - required minA for stepping up with RW - assist needed primarily for lifting LLE onto platform and for steadying. CGA needed for navigating down the curb with RW support. Instructed and educated in backwards stepping to manage curb where she was able to do with minA but she was too fearful of lifting RW backwards up on  the step. She reports baseline assistance needed for managing the threshold at her house, that her sons and husband assist with. Pt transported back upstairs to her room. Remained seated in w/c with safety belt alarm on and all needs within reach. Interpreter remaining at bedside for upcoming OT session.   2nd session: Direct handoff of care from OT with pt sitting in w/c. OT relayed concern for AFO's with heel sliding out of shoe. Messaged Orthotist from [facility] for assistance and will f/u. Pt agreeable to PT tx, no reports of pain. Focused session on functional gait training in hallways. She ambulated ~130ft + ~148ft + ~137ft (seated rest breaks) with close supervision and RW within hallways. Gait distance limited by BLE fatigue. Primary gait deficits include forward flexed trunk, heavy reliance of UE through RW, R foot external rotation. No knee buckling or LOB observed and appropriate foot clearance with AFO's on. Pt concluded session seated EOB with bed alarm on and all needs within reach.  Therapy Documentation Precautions:  Precautions Precautions: Fall (bil foot drop; need interpreter) Restrictions Weight Bearing Restrictions: No General:    Therapy/Group: Individual Therapy  Krysia Zahradnik P Tiffney Haughton PT 03/27/2021, 7:35 AM

## 2021-03-27 NOTE — Progress Notes (Signed)
Occupational Therapy Session Note  Patient Details  Name: Jennifer [name] MRN: [id number] Date of Birth: 02-26-1971  Today's Date: 03/27/2021 OT Individual Time: 1015-1055 OT Individual Time Calculation (min): 40 min    Short Term Goals: Week 2:  OT Short Term Goal 1 (Week 2): STGs=LTGs due to ELOS  Skilled Therapeutic Interventions/Progress Updates:    Pt sitting up in w/c, reporting it was difficult to donn shoes and AFO because she was wearing grip socks; her son is going to bring thinner socks to try.  Pt politely declining to practice/train on shower bench transfer at tub bench level and requesting to strengthen her arms on the bike.  Pt self propelled approximately 50 feet to gym with supervision with intermittent cues to reduce leftward drifting.  Pt needing min assist to approach recumbent bike chair safely.  Squat pivot to chair with supervision needing vcs for w/c mgt. Pt completed 2 x 5 minutes on Nustep BUE/BLE propulsion at level 7 resistance to facilitate increased endurance and strength requiring rest break after 5 minutes of short duration. Squat pivot with supervision back to w/c with Vcs needed for w/c mgt.  Pt self propelled approx 50 feet back to room this time attempting to use BLE to assist in propulsion however bilateral shoes repetitively coming off heel despite redonning.  Direct hand off to PT and collaborated with him regarding AFO and shoe fit.    Therapy Documentation Precautions:  Precautions Precautions: Fall (bil foot drop; need interpreter) Restrictions Weight Bearing Restrictions: No   Therapy/Group: Individual Therapy  Ezekiel Slocumb 03/27/2021, 1:23 PM

## 2021-03-28 LAB — GLUCOSE, CAPILLARY
Glucose-Capillary: 103 mg/dL — ABNORMAL HIGH (ref 70–99)
Glucose-Capillary: 156 mg/dL — ABNORMAL HIGH (ref 70–99)
Glucose-Capillary: 310 mg/dL — ABNORMAL HIGH (ref 70–99)
Glucose-Capillary: 351 mg/dL — ABNORMAL HIGH (ref 70–99)

## 2021-03-28 MED ORDER — INSULIN DETEMIR 100 UNIT/ML ~~LOC~~ SOLN
15.0000 [IU] | Freq: Every day | SUBCUTANEOUS | Status: DC
  Administered 2021-03-28: 15 [IU] via SUBCUTANEOUS
  Filled 2021-03-28 (×3): qty 0.15

## 2021-03-28 NOTE — Progress Notes (Addendum)
[name], female   DOB: [birth year], 50 y.o.   MRN: [id number]  Met with pt to update regarding team conference and left message for son to come in for education on Monday. Awaiting for him to let me know a time on Monday. Aware discharge date still 12/14. Has equipment from past admissions. Unsure if eligible for more due to less than five years old.   1:54 PM Left another message with son but also called daughter who can come in Monday 12/12 at 3:00 pm. Have scheduled pt to have therapy then so daughter can see her progress and care needs. Aware discharge still 12/14.

## 2021-03-28 NOTE — Progress Notes (Signed)
Occupational Therapy Session Note  Patient Details  Name: Jennifer [name] MRN: [id number] Date of Birth: 05-05-1970  Today's Date: 03/28/2021 OT Individual Time: 4098-1191 OT Individual Time Calculation (min): 58 min    Short Term Goals: Week 2:  OT Short Term Goal 1 (Week 2): STGs=LTGs due to ELOS  Skilled Therapeutic Interventions/Progress Updates:    Pt sitting up in w/c, no c/o pain, very appreciative of therapists help.  Interpreter present for assist with communication as needed.  Pt reports her son brought standard socks and agreeable to working on training for donning/doffing footwear.  Pt required max assist initially and then after repetition pt able to complete with min assist.  Required multimodal cues for problem solving and sequencing to doff/donn AFO and shoes.  Discussed caregiver education with pt and she reports her son will be able to assist in AM routine and able to come in for training.  Made interdisciplinary team aware during weekly conference.  Call bell in reach, seat alarm on.  Therapy Documentation Precautions:  Precautions Precautions: Fall (bil foot drop; need interpreter) Restrictions Weight Bearing Restrictions: No    Therapy/Group: Individual Therapy  Ezekiel Slocumb 03/28/2021, 11:03 AM

## 2021-03-28 NOTE — Patient Care Conference (Signed)
Inpatient RehabilitationTeam Conference and Plan of Care Update Date: 03/28/2021   Time: 11:18 AM    Patient Name: Jennifer [name]      Medical Record Number: [id number]  Date of Birth: 01-31-1971 Sex: Female         Room/Bed: 5C08C/5C08C-01 Payor Info: Payor: [place] / Plan: [place] MEDICAID [place] / Product Type: *No Product type* /    Admit Date/Time:  03/17/2021  2:03 PM  Primary Diagnosis:  [facility] Problems: Principal Problem:   Debility Active Problems:   Uncontrolled type 2 diabetes mellitus with hyperglycemia ([place])   Acute blood loss anemia   Labile blood glucose   Chronic kidney disease    Expected Discharge Date: Expected Discharge Date: 04/04/21  Team Members Present: Physician leading conference: Dr. Leeroy Cha Social Worker Present: Ovidio Kin, LCSW Nurse Present: Dorien Chihuahua, RN PT Present: Ginnie Smart, PT OT Present: Leretha Pol, OT PPS Coordinator present : Ileana Ladd, PT     Current Status/Progress Goal Weekly Team Focus  Bowel/Bladder   Pt is cont of b/b. LBM 12/5  Pt remains cont  Toileting   Swallow/Nutrition/ Hydration             ADL's   CGA-supervision squat pivot functional transfaers, setup UB self care, CGA-supervision LB self care using lateral leans (except min assist for AFO/shoes donn/doff), safety awareness improving  supervision-mod I  functional transfer training, self care training, UE strengthening/endurance, w/c mgt training, d/c planning   Mobility   supervision bed mobility, CGA squat<>pivot transfers, gait 163ft with supervision and RW, minA for navigating 4inch curb with RW. Bilateral AFO's provided through [place].  supervision/mod I  Needs to work on Water engineer, bilateral foot drop, functional transfers, safety awareness, endurance training, DC planning   Communication             Safety/Cognition/ Behavioral Observations            Pain   Pt denies pain   Pt remain pain free  Assess for pain qshift and provide PRN as needed   Skin   Pt skin is free of injury  Pt skin remain intact  Skin care qshift     Discharge Planning:  Pt doing well in therapies and making good progress, reaching goals. Pt will not be able to get home health but can try OP due to Medicaid. has all DME   Team Discussion: MD adjusting meds for DM control. Monitor heme + stools; HGB stable.   Patient on target to meet rehab goals: yes, currently needs CGA - supervision for lower body care except for don/doff AFO and son can assist patient with this at discharge. Completes sit - stand and squat/stand pivot transfers with supervision. Able to get up from a low surface chair with min assist. Goals for discharge set for mod I assist.   *See Care Plan and progress notes for long and short-term goals.   Revisions to Treatment Plan:  N/A   Teaching Needs: Safety, medications, dietary modifications, toileting, transfers, etc.   Current Barriers to Discharge: Decreased caregiver support and lack of insurance for [facility] follow up  Possible Resolutions to Barriers: Family education with spouse and son scheduled for 04/02/21 OP follow up services recommended     Medical Summary Current Status: CBGs now elevated, bilateral foot drop, anemia with positive heme occult, blindness, neuropathy  Barriers to Discharge: Medical stability  Barriers to Discharge Comments: CBGs now elevated, bilateral foot drop, anemia with  positive heme occult, blindness, neuropathy Possible Resolutions to [employer]: provide dietary education, increase Levemir daily, custom AFOs, monitor hemoglobin, provided with handicap placard, continue gabapentin   Continued Need for Acute Rehabilitation Level of Care: The patient requires daily medical management by a physician with specialized training in physical medicine and rehabilitation for the following reasons: Direction of a multidisciplinary  physical rehabilitation program to maximize functional independence : Yes Medical management of patient stability for increased activity during participation in an intensive rehabilitation regime.: Yes Analysis of laboratory values and/or radiology reports with any subsequent need for medication adjustment and/or medical intervention. : Yes   I attest that I was present, lead the team conference, and concur with the assessment and plan of the team.   Dorien Chihuahua B 03/28/2021, 4:07 PM

## 2021-03-28 NOTE — Progress Notes (Signed)
PROGRESS NOTE   Subjective/Complaints: Making great gains with therapy She is feeling better as no longer hypoglycemic Hgb stable Walking much better with AFOs  ROS: Denies CP, SOB, N/V/D  Objective:   No results found. Recent Labs    03/26/21 1215  WBC 4.7  HGB 8.4*  HCT 27.0*  PLT 166   Recent Labs    03/26/21 1215  NA 137  K 4.4  CL 105  CO2 24  GLUCOSE 210*  BUN 28*  CREATININE 0.97  CALCIUM 9.0     Intake/Output Summary (Last 24 hours) at 03/28/2021 1120 Last data filed at 03/28/2021 0700 Gross per 24 hour  Intake 416 ml  Output --  Net 416 ml        Physical Exam: Vital Signs Blood pressure 134/73, pulse 73, temperature 98.1 F (36.7 C), resp. rate 17, height 5\' 6"  (1.676 m), weight 76.1 kg, last menstrual period 08/21/2016, SpO2 100 %. Gen: no distress, normal appearing HEENT: oral mucosa pink and moist, NCAT Cardio: Reg rate Chest: normal effort, normal rate of breathing Abd: soft, non-distended Ext: no edema Psych: pleasant, normal affect Skin: Warm and dry.  Intact. Psych: Normal mood.  Normal behavior. Musc: Bilateral ankle extremities.  No tenderness in extremities. Neuro: Alert Bilateral lower extremity foot drop, unchanged  Assessment/Plan: 1. Functional deficits which require 3+ hours per day of interdisciplinary therapy in a comprehensive inpatient rehab setting. Physiatrist is providing close team supervision and 24 hour management of active medical problems listed below. Physiatrist and rehab team continue to assess barriers to discharge/monitor patient progress toward functional and medical goals  Care Tool:  Bathing              Bathing assist Assist Level: Minimal Assistance - Patient > 75%     Upper Body Dressing/Undressing Upper body dressing   What is the patient wearing?: Pull over shirt    Upper body assist Assist Level: Supervision/Verbal cueing     Lower Body Dressing/Undressing Lower body dressing      What is the patient wearing?: Pants     Lower body assist Assist for lower body dressing: Minimal Assistance - Patient > 75%     Toileting Toileting    Toileting assist Assist for toileting: Minimal Assistance - Patient > 75%     Transfers Chair/bed transfer  Transfers assist     Chair/bed transfer assist level: Minimal Assistance - Patient > 75%     Locomotion Ambulation   Ambulation assist      Assist level: Minimal Assistance - Patient > 75% Assistive device: Walker-rolling Max distance: 149ft   Walk 10 feet activity   Assist     Assist level: Minimal Assistance - Patient > 75% Assistive device: Walker-rolling   Walk 50 feet activity   Assist Walk 50 feet with 2 turns activity did not occur: Safety/medical concerns (fatigued)  Assist level: Minimal Assistance - Patient > 75% Assistive device: Walker-rolling    Walk 150 feet activity   Assist Walk 150 feet activity did not occur: Safety/medical concerns (fatigue)  Assist level: Minimal Assistance - Patient > 75% Assistive device: Walker-rolling    Walk 10 feet on uneven surface  activity   Assist Walk 10 feet on uneven surfaces activity did not occur: Safety/medical concerns         Wheelchair     Assist Is the patient using a wheelchair?: Yes Type of Wheelchair: Manual    Wheelchair assist level: Supervision/Verbal cueing Max wheelchair distance: 159ft    Wheelchair 50 feet with 2 turns activity    Assist        Assist Level: Supervision/Verbal cueing   Wheelchair 150 feet activity     Assist      Assist Level: Supervision/Verbal cueing   Blood pressure 134/73, pulse 73, temperature 98.1 F (36.7 C), resp. rate 17, height 5\' 6"  (1.676 m), weight 76.1 kg, last menstrual period 08/21/2016, SpO2 100 %.  Medical Problem List and Plan: 1.  Debility functional deficits secondary to  proctitis/colitis/GI bleed/multi medical  Continue CIR 2.  Impaired mobility: continue SCDs at night as per daughter's request.  -DVT/anticoagulation:  Pharmaceutical: Other (comment) Eliquis             -antiplatelet therapy: N/A 3. Left shoulder pain and limited range of motion: MRI shows arthritis- ortho consulted for shoulder injection but decided to continue exercise at this time as this appears to be helping. Continue Neurontin 300 mg nightly  Encourage elevation of feet with ice to ankles at present-we will consider medications if necessary 4. Mood: Provide emotional support             -antipsychotic agents: N/A 5. Neuropsych: This patient is capable of making decisions on her own behalf. 6. Skin/Wound Care: Routine skin checks 7. Fluids/Electrolytes/Nutrition: Routine in and outs 8.  Proctitis/colitis/GI bleed.  Follow-up GI Dr. Paulita Fujita.  Completed course of Omnicef and Flagyl.  Hemoccult positive. Per GI conservative management recommended for now.  Hemoglobin 8.3 on 12/2, 8.4 on 12/5, continue to monitor weekly.  9.  Prior CVA/abnormal MRI.  Recent admission 02/25/2021 to 03/05/2021.  Plan repeat MRI 4 weeks per neurology services 10.  Atrial fibrillation.  Cardiac rate controlled. Continue Eliquis. Magnesium level 1.9 on 11/28 11.  Hypertension.  Increase amlodipine to 10mg  12.  Uncontrolled diabetes mellitus.  Hemoglobin A1c greater than 12.  Check blood sugars before meals and at bedtime.  Diabetic teaching CBG (last 3)  Recent Labs    03/27/21 1601 03/27/21 2046 03/28/21 0520  GLUCAP 283* 336* 103*  Increase Levemir to 15U Metformin XR 100mg  BID - will hold due to freq loose stools-appear to be improving D/c ISS due to hypoglycemia.  13.  AKI versus CKD.  Placed nursing order to encourage 6-8 glasses of water per day  Creatinine 1.04 on 11/28, 0.97 on 12/5, monitor as needed 14.  Glaucoma.  Left eye blindness.  Follow Dr. Catalina Antigua [facility].  Continue eyedrops. She asked that  Bromidine be discontinued due to burning. I have done so, and asked husband to call Dr. Manuella Ghazi to scheduled outpatient ophthalmology appointment for soon after she leaves here. Messaged Neoma Laming to help get husband permanent handicap placard on Monday.  15.  Urinary retention.  Foley tube removed.  Follow-up outpatient urology as needed 16.  Medical noncompliance.  Counseling 17. Overweight BMI 27.08: provide dietary education.  18. Bilateral lower extremity edema: recommended low sodium diet 19.  Diarrhea:?  Resolved  LOS: 11 days A FACE TO FACE EVALUATION WAS PERFORMED  Har[place] Bula P Arlyn Buerkle 03/28/2021, 11:20 AM

## 2021-03-28 NOTE — Progress Notes (Signed)
Physical Therapy Session Note  Patient Details  Name: Jennifer [name] MRN: [id number] Date of Birth: 1970/07/09  Today's Date: 03/28/2021 PT Individual Time: 0900-1000 + 1300-1357  PT Individual Time Calculation (min): 60 min  + 57 min  Short Term Goals: Week 2:  PT Short Term Goal 1 (Week 2): Pt will complete bed mobility with supervision PT Short Term Goal 2 (Week 2): Pt will complete bed<>chair transfers with CGA consistently PT Short Term Goal 3 (Week 2): Pt will ambulate 127ft with CGA and LRAD PT Short Term Goal 4 (Week 2): Pt will navigate curb with RW and minA  Skilled Therapeutic Interventions/Progress Updates:     1st session: Pt seated EOB to start session - interpreter at bedside to assist with communication. Pt agreeable to PT tx without reports of pain. Assisted in donning shoes with AFO's - requiring totalA due to foot drop, limited ROM in hips, and BLE/BUE weakness with neuropathy in hands. Sit<>Stand to RW with supervision and ambulates to [facility] elevators, ~151ft, with supervision and RW - demo's R foot ER, forward flexed trunk, and heavy reliance of UE support through RW but no signs of knee buckling or LOB. Transported in w/c the remaining distance to ortho rehab gym. Completed car transfer at [facility] level with RW - car height simulating small sized sedan and pt with increased difficulty producing power to rise. Educated on car adaptive equipment (portable support handle) and pt expressed interest - wrote down on paper for her to order through [place] if desired. Pt then negotiated 30ft ramp with CGA and RW - increased difficulty during descent > ascent with some poor safety awareness with RW management while navigating ramp threshold as she push's RW too far forwards away from body. Despite ed, she continues to do this with poor carryover. Pt requesting to use Nustep to strengthen LE's. Transported upstairs to [facility] rehab gym and assisted onto Nustep with ambulatory transfer and RW. Required  assistance for BLE placement into paddles. Completed 19min with LE's only on workload 4 but then, due to fatigue, required UE assistance for remaining time. Graded task up with workload to 7 for remaining time due to UE assist. Required x5 rest breaks to complete the 10 minute set. Pt assist back to w/c via stand<>pivot transfer and returned to her room where she remained seated in w/c with safety belt on, all needs in reach.  2nd session: Pt seated in w/c to start - agreeable to PT tx without reports of pain. Interpreter arriving later in session to assist with communication. Pt with B AFO's on during treatment. Transported her downstairs in w/c for time and energy conservation to 36M rehab gym. Completed stand<>pivot transfer with CGA and RW from w/c to mat table. Instructed in gait training with RW on level tile - ambulated ~173ft with close supervision and RW. Gait speed decreased but improved safety awareness with postural control and keeping body within walker frame. Instructed pt in repeated step-ups onto 3inch step using 2 hand rails and modA for balance- left LLE on step to work on [employer] training and then transitioned to RLE bias. Pt able to complete x5 reps prior to fatigue. Completed x4 sets total with rest breaks needed b/w sets due to BLE fatigue. Anticipate pt will require family assist to navigate doorway entrance at home due to 3-4inch threshold. Pt returned upstairs to her room, remained seated in w/c with safety belt on, all needs in reach.   Therapy Documentation Precautions:  Precautions Precautions: Fall (  bil foot drop; need interpreter) Restrictions Weight Bearing Restrictions: No General:    Therapy/Group: Individual Therapy  Alger Simons 03/28/2021, 7:47 AM

## 2021-03-29 LAB — GLUCOSE, CAPILLARY
Glucose-Capillary: 163 mg/dL — ABNORMAL HIGH (ref 70–99)
Glucose-Capillary: 222 mg/dL — ABNORMAL HIGH (ref 70–99)
Glucose-Capillary: 287 mg/dL — ABNORMAL HIGH (ref 70–99)
Glucose-Capillary: 162 mg/dL — ABNORMAL HIGH (ref 70–99)

## 2021-03-29 MED ORDER — INSULIN ASPART 100 UNIT/ML IJ SOLN
3.0000 [IU] | Freq: Three times a day (TID) | INTRAMUSCULAR | Status: DC
  Administered 2021-03-29 – 2021-04-03 (×14): 3 [IU] via SUBCUTANEOUS

## 2021-03-29 MED ORDER — INSULIN ASPART 100 UNIT/ML IJ SOLN
0.0000 [IU] | Freq: Every day | INTRAMUSCULAR | Status: DC
  Administered 2021-03-30 – 2021-03-31 (×2): 2 [IU] via SUBCUTANEOUS
  Administered 2021-04-01: 3 [IU] via SUBCUTANEOUS
  Administered 2021-04-02 – 2021-04-03 (×2): 2 [IU] via SUBCUTANEOUS

## 2021-03-29 MED ORDER — INSULIN ASPART 100 UNIT/ML IJ SOLN
0.0000 [IU] | Freq: Three times a day (TID) | INTRAMUSCULAR | Status: DC
  Administered 2021-03-29: 3 [IU] via SUBCUTANEOUS
  Administered 2021-03-30: 1 [IU] via SUBCUTANEOUS
  Administered 2021-03-30: 2 [IU] via SUBCUTANEOUS
  Administered 2021-03-31: 4 [IU] via SUBCUTANEOUS
  Administered 2021-03-31 – 2021-04-01 (×2): 2 [IU] via SUBCUTANEOUS
  Administered 2021-04-02 – 2021-04-03 (×2): 3 [IU] via SUBCUTANEOUS

## 2021-03-29 MED ORDER — INSULIN DETEMIR 100 UNIT/ML ~~LOC~~ SOLN
16.0000 [IU] | Freq: Every day | SUBCUTANEOUS | Status: DC
Start: 2021-03-29 — End: 2021-04-02
  Administered 2021-03-29 – 2021-04-01 (×4): 16 [IU] via SUBCUTANEOUS
  Filled 2021-03-29 (×6): qty 0.16

## 2021-03-29 MED ORDER — AMLODIPINE BESYLATE 5 MG PO TABS
5.0000 mg | ORAL_TABLET | Freq: Every day | ORAL | Status: DC
  Administered 2021-03-30: 5 mg via ORAL
  Filled 2021-03-29: qty 1

## 2021-03-29 NOTE — Progress Notes (Signed)
Physical Therapy Session Note  Patient Details  Name: Jennifer [name] MRN: [id number] Date of Birth: 1970/09/28  Today's Date: 03/29/2021 PT Individual Time: 0900-1000 + 1300-1410 PT Individual Time Calculation (min): 60 min + 70 min  Short Term Goals: Week 2:  PT Short Term Goal 1 (Week 2): Pt will complete bed mobility with supervision PT Short Term Goal 2 (Week 2): Pt will complete bed<>chair transfers with CGA consistently PT Short Term Goal 3 (Week 2): Pt will ambulate 182ft with CGA and LRAD PT Short Term Goal 4 (Week 2): Pt will navigate curb with RW and minA  Skilled Therapeutic Interventions/Progress Updates:     1st session: Pt seated EOB To start session - agreeable to PT tx without reports of pain. Donned B shoes and AFO's required maxA (Pt 30%). Pt reports son will assist with this in morning.   Pt completed sit<>Stand to RW with supervision and then stand<>pivot transfer to w/c with supervision. Propelled herself in w/c ~153ft with supervision using BUE's on level tile - effortful with decreased speed, focused on endurance. Transported to 5N tower bridge to focus on gait training and activity tolerance. Ambulated ~173ft + ~146ft with supervision and RW - VC only needed for keeping body within walker frame and reducing forward flexed trunk. She had a few brief standing rest breaks due to BLE fatigue but no formal LOB or knee buckling observed. Pt pleased with her progress that she's made over the past 10 days.   Assisted onto Nustep with stand<>pivot transfer with RW and supervision. Required assist for placing BLE's into paddles on Nustep. Completed 7 minutes at workload 7, using BUE/BLE for AAROM with VC for biasing LE for strengthening. Pt assisted back to w/c and returned to her room where she remained seated in w/c with safety belt on, all needs within reach.    2nd session: Pt seated in w/c to start session - agreeable to PT tx without signs of pain. No interpreter present  until midway of session. Pt required assistance donning her L shoe and AFO as it fell off while she was napping in bed. Required maxA for donning these. Sit<>stand to RW with supervision and bed<>chair transfer with supervision and RW - VC for safety awareness. Transported outdoors in w/c for time and energy conservation. Outdoors, worked on gait on [employer] with RW support - required CGA due to increased unsteadiness. Ambulated ~161ft + ~155ft with seated rest. Education on energy conservation, safety awareness during rest break outdoors. Pt transported back upstairs to day room rehab gym. Assisted onto [facility] with RW and minA. Seated on Kinetron, completed x10 minutes with a few brief rest breaks - resistance set to 40cm/sec. VC needed for producing power of LE's, emphasizing hip extension bilaterally to assist with power in functional mobility tasks. Attempted to complete Kinetron task in standing but pt fearful of falling, thus deferred. Instructed in gait on level surfaces indoors - ambulated ~130ft with supervision and RW with VC for keeping body within walker frame and reducing R foot ER. During seated rest - instructed in 5xSTS from arm chair - required minA for powering to rise. Returned upstairs to her room - remained seated in w/c with safety belt alarm on, all needs in reach.   Therapy Documentation Precautions:  Precautions Precautions: Fall (bil foot drop; need interpreter) Restrictions Weight Bearing Restrictions: No General:    Therapy/Group: Individual Therapy  Alger Simons 03/29/2021, 7:33 AM

## 2021-03-29 NOTE — Progress Notes (Signed)
Inpatient Diabetes Program Recommendations  AACE/ADA: New Consensus Statement on Inpatient Glycemic Control  Target Ranges:  Prepandial:   less than 140 mg/dL      Peak postprandial:   less than 180 mg/dL (1-2 hours)      Critically ill patients:  140 - 180 mg/dL    Latest Reference Range & Units 03/28/21 05:20 03/28/21 11:42 03/28/21 16:37 03/28/21 20:42 03/29/21 06:11 03/29/21 11:08  Glucose-Capillary 70 - 99 mg/dL 103 (H) 156 (H) 310 (H) 351 (H) 163 (H) 222 (H)   Review of Glycemic Control  Diabetes history: DM2 Outpatient Diabetes medications: Levemir 15 units QHS, Novolog 3 units TID, Tradjenta 5 mg QD, Metformin 1000 mg BID Current orders for Inpatient glycemic control: Levmemir 16 units QHS, Tradjenta 5 mg QD  Inpatient Diabetes Program Recommendations:    Insulin: Please consider ordering Novolog 0-6 units TID with meals, Novolog 0-5 units QHS, and Novolog 3 units TID with meals for meal coverage if patient eats at least 50% of meals.  Thanks, Barnie Alderman, RN, MSN, CDE Diabetes Coordinator Inpatient Diabetes Program [phone] (Team Pager from 8am to 5pm)

## 2021-03-29 NOTE — Progress Notes (Signed)
PROGRESS NOTE   Subjective/Complaints: Sleepy this morning Appreciate diabetes coordinator recommendations. Novolod 0-6U TID added with meals, 0-5U added HS, and 3 U TID added with meals if patient eats at least 50% of meals   ROS: Denies CP, SOB, N/V/D  Objective:   No results found. No results for input(s): WBC, HGB, HCT, PLT in the last 72 hours.  No results for input(s): NA, K, CL, CO2, GLUCOSE, BUN, CREATININE, CALCIUM in the last 72 hours.    Intake/Output Summary (Last 24 hours) at 03/29/2021 1638 Last data filed at 03/29/2021 0700 Gross per 24 hour  Intake 360 ml  Output --  Net 360 ml        Physical Exam: Vital Signs Blood pressure (!) 124/55, pulse 79, temperature 98.6 F (37 C), temperature source Oral, resp. rate 18, height 5\' 6"  (1.676 m), weight 76.1 kg, last menstrual period 08/21/2016, SpO2 95 %. Gen: no distress, normal appearing HEENT: oral mucosa pink and moist, NCAT Cardio: Reg rate, hypotensive Chest: normal effort, normal rate of breathing Abd: soft, non-distended Ext: no edema Psych: pleasant, normal affect Skin: Warm and dry.  Intact. Psych: Normal mood.  Normal behavior. Musc: Bilateral ankle extremities.  No tenderness in extremities. Neuro: Alert Bilateral lower extremity foot drop, unchanged  Assessment/Plan: 1. Functional deficits which require 3+ hours per day of interdisciplinary therapy in a comprehensive inpatient rehab setting. Physiatrist is providing close team supervision and 24 hour management of active medical problems listed below. Physiatrist and rehab team continue to assess barriers to discharge/monitor patient progress toward functional and medical goals  Care Tool:  Bathing              Bathing assist Assist Level: Minimal Assistance - Patient > 75%     Upper Body Dressing/Undressing Upper body dressing   What is the patient wearing?: Pull over shirt     Upper body assist Assist Level: Supervision/Verbal cueing    Lower Body Dressing/Undressing Lower body dressing      What is the patient wearing?: Pants     Lower body assist Assist for lower body dressing: Minimal Assistance - Patient > 75%     Toileting Toileting    Toileting assist Assist for toileting: Minimal Assistance - Patient > 75%     Transfers Chair/bed transfer  Transfers assist     Chair/bed transfer assist level: Supervision/Verbal cueing     Locomotion Ambulation   Ambulation assist      Assist level: Supervision/Verbal cueing Assistive device: Walker-rolling Max distance: 160ft   Walk 10 feet activity   Assist     Assist level: Supervision/Verbal cueing Assistive device: Walker-rolling   Walk 50 feet activity   Assist Walk 50 feet with 2 turns activity did not occur: Safety/medical concerns (fatigued)  Assist level: Supervision/Verbal cueing Assistive device: Walker-rolling    Walk 150 feet activity   Assist Walk 150 feet activity did not occur: Safety/medical concerns (fatigue)  Assist level: Supervision/Verbal cueing Assistive device: Walker-rolling    Walk 10 feet on uneven surface  activity   Assist Walk 10 feet on uneven surfaces activity did not occur: Safety/medical concerns   Assist level: Contact Guard/Touching assist Assistive  device: Aeronautical engineer Is the patient using a wheelchair?: Yes Type of Wheelchair: Manual    Wheelchair assist level: Supervision/Verbal cueing Max wheelchair distance: 167ft    Wheelchair 50 feet with 2 turns activity    Assist        Assist Level: Supervision/Verbal cueing   Wheelchair 150 feet activity     Assist      Assist Level: Supervision/Verbal cueing   Blood pressure (!) 124/55, pulse 79, temperature 98.6 F (37 C), temperature source Oral, resp. rate 18, height 5\' 6"  (1.676 m), weight 76.1 kg, last menstrual period  08/21/2016, SpO2 95 %.  Medical Problem List and Plan: 1.  Debility functional deficits secondary to proctitis/colitis/GI bleed/multi medical  Continue CIR 2.  Impaired mobility: continue SCDs at night as per daughter's request.  -DVT/anticoagulation:  Pharmaceutical: Other (comment) Eliquis             -antiplatelet therapy: N/A 3. Left shoulder pain and limited range of motion: MRI shows arthritis- ortho consulted for shoulder injection but decided to continue exercise at this time as this appears to be helping. Continue Neurontin 300 mg nightly  Encourage elevation of feet with ice to ankles at present-we will consider medications if necessary 4. Mood: Provide emotional support             -antipsychotic agents: N/A 5. Neuropsych: This patient is capable of making decisions on her own behalf. 6. Skin/Wound Care: Routine skin checks 7. Fluids/Electrolytes/Nutrition: Routine in and outs 8.  Proctitis/colitis/GI bleed.  Follow-up GI Dr. Paulita Fujita.  Completed course of Omnicef and Flagyl.  Hemoccult positive. Per GI conservative management recommended for now.  Hemoglobin 8.3 on 12/2, 8.4 on 12/5, continue to monitor weekly.  9.  Prior CVA/abnormal MRI.  Recent admission 02/25/2021 to 03/05/2021.  Plan repeat MRI 4 weeks per neurology services 10.  Atrial fibrillation.  Cardiac rate controlled. Continue Eliquis. Magnesium level 1.9 on 11/28 11.  Hypertension.  Decrease amlodipine to 5mg . 12.  Uncontrolled diabetes mellitus.  Hemoglobin A1c greater than 12.  Check blood sugars before meals and at bedtime.  Diabetic teaching CBG (last 3)  Recent Labs    03/29/21 0611 03/29/21 1108 03/29/21 1629  GLUCAP 163* 222* 287*  Increase Levemir to 15U Metformin XR 100mg  BID - will hold due to freq loose stools-appear to be improving Novolog 0-6 U added TID with meals as needed.   13.  AKI versus CKD.  Placed nursing order to encourage 6-8 glasses of water per day  Creatinine 1.04 on 11/28, 0.97 on  12/5, monitor as needed 14.  Glaucoma.  Left eye blindness.  Follow Dr. Catalina Antigua [facility].  Continue eyedrops. She asked that Bromidine be discontinued due to burning. I have done so, and asked husband to call Dr. Manuella Ghazi to scheduled outpatient ophthalmology appointment for soon after she leaves here. Messaged Neoma Laming to help get husband permanent handicap placard on Monday.  15.  Urinary retention.  Foley tube removed.  Follow-up outpatient urology as needed 16.  Medical noncompliance.  Counseling 17. Overweight BMI 27.08: provide dietary education.  18. Bilateral lower extremity edema: recommended low sodium diet 19.  Diarrhea:?  Resolved  LOS: 12 days A FACE TO FACE EVALUATION WAS PERFORMED  Izora Ribas 03/29/2021, 4:38 PM

## 2021-03-29 NOTE — Progress Notes (Signed)
Physical Therapy Session Note  Patient Details  Name: Jennifer [name] MRN: [id number] Date of Birth: [birth year]  Today's Date: 03/29/2021 PT Individual Time: 1002-1058 PT Individual Time Calculation (min): 56 min   Short Term Goals: Week 1:  PT Short Term Goal 1 (Week 1): pt will transfer bed>< wc with CGA PT Short Term Goal 2 (Week 1): pt will propel wc x 150' with supervision and cues for steering on level tile PT Short Term Goal 3 (Week 1): pt will move sit>< stand iwht CGA PT Short Term Goal 4 (Week 1): pt will perform gait with RW on level tile x 100', CGA PT Short Term Goal 5 (Week 1): pt will be assessed for bil AFOs Week 2:  PT Short Term Goal 1 (Week 2): Pt will complete bed mobility with supervision PT Short Term Goal 2 (Week 2): Pt will complete bed<>chair transfers with CGA consistently PT Short Term Goal 3 (Week 2): Pt will ambulate 166ft with CGA and LRAD PT Short Term Goal 4 (Week 2): Pt will navigate curb with RW and minA Week 3:     Skilled Therapeutic Interventions/Progress Updates:    Pt denies pain.  Initially oob in wc.  Transported to 4th CIR gym. No translator available initially/arrived after 20 min to assist w/session. LE strengthening activities: Sit to stand w/supervision in parallel bars. Sidestepping length of bars 23ft x 6 w/cues to avoid ER Squats w/ue support, max cues for technique x 9 reps. Standing mini marches 2x5 each then pt resumed sidestepping 1x43ft each direction Seated hip adduction w/ball 3 x 8 w/5sec holds Seated hip abd agaist manual resistance 2x10  At gym stairs repeated single 3in step for funcitonal strengtheing, requires assist to lift foot to place on stair, relies on bilat handrails for support. 10x RLE leads, 5x LLE leads, required seated rest between sets.  Gait 47ft w/supervision w/RW, turn/sit to edge of bed w/supervision.  Pt requested to remain sitting on edge of bed, bed alarm set, needs in reach.  Therapy  Documentation Precautions:  Precautions Precautions: Fall (bil foot drop; need interpreter) Restrictions Weight Bearing Restrictions: No    Therapy/Group: Individual Therapy Callie Fielding, [place] 03/29/2021, 4:07 PM

## 2021-03-30 LAB — GLUCOSE, CAPILLARY
Glucose-Capillary: 70 mg/dL (ref 70–99)
Glucose-Capillary: 155 mg/dL — ABNORMAL HIGH (ref 70–99)
Glucose-Capillary: 246 mg/dL — ABNORMAL HIGH (ref 70–99)
Glucose-Capillary: 247 mg/dL — ABNORMAL HIGH (ref 70–99)

## 2021-03-30 MED ORDER — AMLODIPINE BESYLATE 10 MG PO TABS
10.0000 mg | ORAL_TABLET | Freq: Every day | ORAL | Status: DC
  Administered 2021-03-31 – 2021-04-04 (×5): 10 mg via ORAL
  Filled 2021-03-30 (×5): qty 1

## 2021-03-30 NOTE — Progress Notes (Signed)
Physical Therapy Session Note  Patient Details  Name: Jennifer [name] MRN: [id number] Date of Birth: 03/25/1971  Today's Date: 03/30/2021 PT Individual Time: 1000-1100 PT Individual Time Calculation (min): 60 min   Short Term Goals: Week 2:  PT Short Term Goal 1 (Week 2): Pt will complete bed mobility with supervision PT Short Term Goal 2 (Week 2): Pt will complete bed<>chair transfers with CGA consistently PT Short Term Goal 3 (Week 2): Pt will ambulate 171ft with CGA and LRAD PT Short Term Goal 4 (Week 2): Pt will navigate curb with RW and minA  Skilled Therapeutic Interventions/Progress Updates: Pt presents sitting in w/c and agreeable to therapy.  Pt transfers sit to stand w/ supervision although occasional verbal cues for position of R foot.  Pt states AFOs feel good and are helping w/ her gait.  Pt amb 150' w/ RW and CGA w/ 1 standing rest break 2/2 fatigue.  RLE ER.  Verbal cues for upright posture and maintaining position w/in RW for safety.  Pt performed multiple sit to stand transfers w/ supervision for marching, sidestepping, forward and backward stepping w/ HHA only.  Pt amb x 25' into gym for use of Nu-step.  Pt required min A for placement of feet in proper position.  Pt performed x 6' at Level 4 using B UEs and LEs.  Pt amb x 150' back to room w/ supervision.  Pt wished to sit EOB w/ all needs in reach, bed alarm on.  NT notified of pt sitting EOB.     Therapy Documentation Precautions:  Precautions Precautions: Fall (bil foot drop; need interpreter) Restrictions Weight Bearing Restrictions: No General:   Vital Signs:  Pain:0/10 Pain Assessment Pain Scale: 0-10 Pain Score: 0-No pain Mobility:       Therapy/Group: Individual Therapy  Ladoris Gene 03/30/2021, 12:07 PM

## 2021-03-30 NOTE — Progress Notes (Signed)
PROGRESS NOTE   Subjective/Complaints: She has no new complaints this morning Very appreciate of care Appreciate diabetes coordinator recommendations- CBGs better controlled   ROS: Denies CP, SOB, N/V/D  Objective:   No results found. No results for input(s): WBC, HGB, HCT, PLT in the last 72 hours.  No results for input(s): NA, K, CL, CO2, GLUCOSE, BUN, CREATININE, CALCIUM in the last 72 hours.    Intake/Output Summary (Last 24 hours) at 03/30/2021 1040 Last data filed at 03/30/2021 0730 Gross per 24 hour  Intake 360 ml  Output --  Net 360 ml        Physical Exam: Vital Signs Blood pressure (!) 149/76, pulse 71, temperature 98.4 F (36.9 C), temperature source Oral, resp. rate 18, height 5\' 6"  (1.676 m), weight 76.1 kg, last menstrual period 08/21/2016, SpO2 100 %. Gen: no distress, normal appearing HEENT: oral mucosa pink and moist, NCAT Cardio: Reg rate, systolic BP elevated Chest: normal effort, normal rate of breathing Abd: soft, non-distended Ext: no edema Psych: pleasant, normal affect Skin: Warm and dry.  Intact. Psych: Normal mood.  Normal behavior. Musc: Bilateral ankle extremities.  No tenderness in extremities. Neuro: Alert Bilateral lower extremity foot drop, unchanged  Assessment/Plan: 1. Functional deficits which require 3+ hours per day of interdisciplinary therapy in a comprehensive inpatient rehab setting. Physiatrist is providing close team supervision and 24 hour management of active medical problems listed below. Physiatrist and rehab team continue to assess barriers to discharge/monitor patient progress toward functional and medical goals  Care Tool:  Bathing              Bathing assist Assist Level: Minimal Assistance - Patient > 75%     Upper Body Dressing/Undressing Upper body dressing   What is the patient wearing?: Pull over shirt    Upper body assist Assist Level:  Supervision/Verbal cueing    Lower Body Dressing/Undressing Lower body dressing      What is the patient wearing?: Pants     Lower body assist Assist for lower body dressing: Minimal Assistance - Patient > 75%     Toileting Toileting    Toileting assist Assist for toileting: Minimal Assistance - Patient > 75%     Transfers Chair/bed transfer  Transfers assist     Chair/bed transfer assist level: Supervision/Verbal cueing     Locomotion Ambulation   Ambulation assist      Assist level: Supervision/Verbal cueing Assistive device: Walker-rolling Max distance: 173ft   Walk 10 feet activity   Assist     Assist level: Supervision/Verbal cueing Assistive device: Walker-rolling   Walk 50 feet activity   Assist Walk 50 feet with 2 turns activity did not occur: Safety/medical concerns (fatigued)  Assist level: Supervision/Verbal cueing Assistive device: Walker-rolling    Walk 150 feet activity   Assist Walk 150 feet activity did not occur: Safety/medical concerns (fatigue)  Assist level: Supervision/Verbal cueing Assistive device: Walker-rolling    Walk 10 feet on uneven surface  activity   Assist Walk 10 feet on uneven surfaces activity did not occur: Safety/medical concerns   Assist level: Contact Guard/Touching assist Assistive device: Chemical engineer     Assist Is  the patient using a wheelchair?: Yes Type of Wheelchair: Manual    Wheelchair assist level: Supervision/Verbal cueing Max wheelchair distance: 137ft    Wheelchair 50 feet with 2 turns activity    Assist        Assist Level: Supervision/Verbal cueing   Wheelchair 150 feet activity     Assist      Assist Level: Supervision/Verbal cueing   Blood pressure (!) 149/76, pulse 71, temperature 98.4 F (36.9 C), temperature source Oral, resp. rate 18, height 5\' 6"  (1.676 m), weight 76.1 kg, last menstrual period 08/21/2016, SpO2 100 %.  Medical  Problem List and Plan: 1.  Debility functional deficits secondary to proctitis/colitis/GI bleed/multi medical  Continue CIR 2.  Impaired mobility: continue SCDs at night as per daughter's request.  -DVT/anticoagulation:  Pharmaceutical: Other (comment) Eliquis             -antiplatelet therapy: N/A 3. Left shoulder pain and limited range of motion: MRI shows arthritis- ortho consulted for shoulder injection but decided to continue exercise at this time as this appears to be helping. Continue Neurontin 300 mg nightly  Encourage elevation of feet with ice to ankles at present-we will consider medications if necessary 4. Mood: Provide emotional support             -antipsychotic agents: N/A 5. Neuropsych: This patient is capable of making decisions on her own behalf. 6. Skin/Wound Care: Routine skin checks 7. Fluids/Electrolytes/Nutrition: Routine in and outs 8.  Proctitis/colitis/GI bleed.  Follow-up GI Dr. Paulita Fujita.  Completed course of Omnicef and Flagyl.  Hemoccult positive. Per GI conservative management recommended for now.  Hemoglobin 8.3 on 12/2, 8.4 on 12/5, continue to monitor weekly.  9.  Prior CVA/abnormal MRI.  Recent admission 02/25/2021 to 03/05/2021.  Plan repeat MRI 4 weeks per neurology services 10.  Atrial fibrillation.  Cardiac rate controlled. Continue Eliquis. Magnesium level 1.9 on 11/28 11.  Hypertension.  Increase amlodipine to 10mg  12.  Uncontrolled diabetes mellitus.  Hemoglobin A1c greater than 12.  Check blood sugars before meals and at bedtime.  Diabetic teaching CBG (last 3)  Recent Labs    03/29/21 1629 03/29/21 2107 03/30/21 0605  GLUCAP 287* 162* 70  Increase Levemir to 15U Metformin XR 100mg  BID - will hold due to freq loose stools-appear to be improving Novolog 0-6 U added TID with meals as needed.  Novolog 3U added with meals if she eats >50% 13.  AKI versus CKD.  Placed nursing order to encourage 6-8 glasses of water per day  Creatinine 1.04 on 11/28,  0.97 on 12/5, monitor as needed 14.  Glaucoma.  Left eye blindness.  [facility]ina [facility].  Continue eyedrops. She asked that Bromidine be discontinued due to burning. I have done so, and asked husband to call Dr. Manuella Ghazi to scheduled outpatient ophthalmology appointment for soon after she leaves here. Messaged Neoma Laming to help get husband permanent handicap placard on Monday.  15.  Urinary retention.  Foley tube removed.  Follow-up outpatient urology as needed 16.  Medical noncompliance.  Counseling 17. Overweight BMI 27.08: provide dietary education.  18. Bilateral lower extremity edema: recommended low sodium diet 19.  Diarrhea:?  Resolved 20. Disposition: HFU scheduled.   LOS: 13 days A FACE TO FACE EVALUATION WAS PERFORMED  Kelseigh Diver P Cylan Borum 03/30/2021, 10:40 AM

## 2021-03-30 NOTE — Progress Notes (Signed)
Physical Therapy Session Note  Patient Details  Name: Jennifer [name] MRN: [id number] Date of Birth: [birth year]  Today's Date: 03/30/2021 PT Individual Time: 0905-1000 PT Individual Time Calculation (min): 55 min   Short Term Goals: Week 1:  PT Short Term Goal 1 (Week 1): pt will transfer bed>< wc with CGA PT Short Term Goal 2 (Week 1): pt will propel wc x 150' with supervision and cues for steering on level tile PT Short Term Goal 3 (Week 1): pt will move sit>< stand iwht CGA PT Short Term Goal 4 (Week 1): pt will perform gait with RW on level tile x 100', CGA PT Short Term Goal 5 (Week 1): pt will be assessed for bil AFOs Week 2:  PT Short Term Goal 1 (Week 2): Pt will complete bed mobility with supervision PT Short Term Goal 2 (Week 2): Pt will complete bed<>chair transfers with CGA consistently PT Short Term Goal 3 (Week 2): Pt will ambulate 124ft with CGA and LRAD PT Short Term Goal 4 (Week 2): Pt will navigate curb with RW and minA Week 3:     Skilled Therapeutic Interventions/Progress Updates:    Pt initially seated on edge of bed w/interpreter present for full session.   Therapist reviewed technique for donning AFLOs w/shoes.  Therapist provided mod assist overall for donning. Sit to stand w/RW w/cues for posture, stand pivot transfer to wc w/cues for safet w/AD Pt propelled wc 64ft including 1 turn.  Transported to [facility] gym. stand pivot transfer to NuStep w/cues for properly positioning feet prior to transition. , cga. Pt performed 2 min L7 w/Les only and rest at 1 min then continued w/4exts x 10 min for total steps =   Gait 61ft to second gym where pt continued w/standing balance + grip strength functional training via reaching to L to obtain clothespins from besdie table w/L hand, transfers to R to reach overhead and clip pins to vertical poles, cga using counter for balance support. Seated rest then repeats reaching to R and transferring to L hand to clip on horizontal  poles.  Short distance gait w/RW to wc w/cues for safety, attention to foot placement w/Sit to stand.  Pt tends to leave knees in 100+ degrees of flexion resulting in post bias when attemting to come to stand.  Repeated instruction during session + education of increased fall risk w/this.  Pt receptive, recall/carryover limited at this time.  Wc propulsion x 63ft w/bilat Ues then transported remaining distance to room.  Pt left oob in wc w/alarm belt set and needs in reach    Therapy Documentation Precautions:  Precautions Precautions: Fall (bil foot drop; need interpreter) Restrictions Weight Bearing Restrictions: No   Therapy/Group: Individual Therapy Callie Fielding, [place] 03/30/2021, 12:21 PM

## 2021-03-30 NOTE — Progress Notes (Signed)
Occupational Therapy Session Note  Patient Details  Name: Jennifer [name] MRN: [id number] Date of Birth: 1971/03/27  Today's Date: 03/30/2021 OT Individual Time: 1300-1400 OT Individual Time Calculation (min): 60 min    Short Term Goals: Week 2:  OT Short Term Goal 1 (Week 2): STGs=LTGs due to ELOS  Skilled Therapeutic Interventions/Progress Updates:    Pt received in room with interpretor present.  Pt needed max A to don AFOs and shoes.  Tried multiple strategies to have her don them more easily but pt stated her family will A her. Pt agreeable to ambulating to bathroom to work on toilet transfers, using RW pt needed Close S to stand and sit to [facility] over toilet.  Noticed pt hyperextending knees to A her rise to stand.  Had pt ambulate to arm chair in room. Worked from here to focus on LE strength with sit >< stands, chair push ups, theraband hip abd, pillow squeezes for hip add, theraband leg extensions. Worked on postural strength with band rows for upper back strength. Provided pt with med soft therapy and 2nd foam block (first one got lost) for hand and finger strength.   Pt practiced squeezing and pinching putty. Encouraged her to work on this over the weekend.  Pt then used RW to return to bed to rest. Good participation.  Bed alarm set and all needs met.   Therapy Documentation Precautions:  Precautions Precautions: Fall (bil foot drop; need interpreter) Restrictions Weight Bearing Restrictions: No    Vital Signs: Therapy Vitals Temp: 98.2 F (36.8 C) Pulse Rate: 85 Resp: 18 BP: 140/62 Patient Position (if appropriate): Lying Oxygen Therapy SpO2: 95 % O2 Device: Room Air Pain: Pain Assessment Pain Score: 0-No pain    Therapy/Group: Individual Therapy  [place] 03/30/2021, 3:47 PM

## 2021-03-31 LAB — GLUCOSE, CAPILLARY
Glucose-Capillary: 70 mg/dL (ref 70–99)
Glucose-Capillary: 342 mg/dL — ABNORMAL HIGH (ref 70–99)
Glucose-Capillary: 210 mg/dL — ABNORMAL HIGH (ref 70–99)
Glucose-Capillary: 214 mg/dL — ABNORMAL HIGH (ref 70–99)

## 2021-04-01 LAB — GLUCOSE, CAPILLARY
Glucose-Capillary: 113 mg/dL — ABNORMAL HIGH (ref 70–99)
Glucose-Capillary: 66 mg/dL — ABNORMAL LOW (ref 70–99)
Glucose-Capillary: 87 mg/dL (ref 70–99)
Glucose-Capillary: 204 mg/dL — ABNORMAL HIGH (ref 70–99)
Glucose-Capillary: 287 mg/dL — ABNORMAL HIGH (ref 70–99)

## 2021-04-01 NOTE — Progress Notes (Signed)
PROGRESS NOTE   Subjective/Complaints:  Pt reports no - no issues.   ROS:  Pt denies SOB, abd pain, CP, N/V/C/D, and vision changes   Objective:   No results found. No results for input(s): WBC, HGB, HCT, PLT in the last 72 hours.  No results for input(s): NA, K, CL, CO2, GLUCOSE, BUN, CREATININE, CALCIUM in the last 72 hours.    Intake/Output Summary (Last 24 hours) at 04/01/2021 1309 Last data filed at 03/31/2021 1859 Gross per 24 hour  Intake 240 ml  Output --  Net 240 ml        Physical Exam: Vital Signs Blood pressure 130/68, pulse 76, temperature 98.4 F (36.9 C), temperature source Oral, resp. rate 20, height 5\' 6"  (1.676 m), weight 76.1 kg, last menstrual period 08/21/2016, SpO2 95 %.    General: awake, alert, appropriate, sitting EOB with things in bags; NAD HENT: conjugate gaze; oropharynx moist CV: regular rate; no JVD Pulmonary: CTA B/L; no W/R/R- good air movement GI: soft, NT, ND, (+)BS Psychiatric: appropriate- interactive Neurological: alert  Skin: Warm and dry.  Intact. Psych: Normal mood.  Normal behavior. Musc: Bilateral ankle extremities.  No tenderness in extremities. Neuro: Alert Bilateral lower extremity foot drop, unchanged  Assessment/Plan: 1. Functional deficits which require 3+ hours per day of interdisciplinary therapy in a comprehensive inpatient rehab setting. Physiatrist is providing close team supervision and 24 hour management of active medical problems listed below. Physiatrist and rehab team continue to assess barriers to discharge/monitor patient progress toward functional and medical goals  Care Tool:  Bathing              Bathing assist Assist Level: Minimal Assistance - Patient > 75%     Upper Body Dressing/Undressing Upper body dressing   What is the patient wearing?: Pull over shirt    Upper body assist Assist Level: Supervision/Verbal cueing     Lower Body Dressing/Undressing Lower body dressing      What is the patient wearing?: Pants     Lower body assist Assist for lower body dressing: Minimal Assistance - Patient > 75%     Toileting Toileting    Toileting assist Assist for toileting: Minimal Assistance - Patient > 75%     Transfers Chair/bed transfer  Transfers assist     Chair/bed transfer assist level: Supervision/Verbal cueing     Locomotion Ambulation   Ambulation assist      Assist level: Supervision/Verbal cueing Assistive device: Walker-rolling Max distance: 171ft   Walk 10 feet activity   Assist     Assist level: Supervision/Verbal cueing Assistive device: Walker-rolling   Walk 50 feet activity   Assist Walk 50 feet with 2 turns activity did not occur: Safety/medical concerns (fatigued)  Assist level: Supervision/Verbal cueing Assistive device: Walker-rolling    Walk 150 feet activity   Assist Walk 150 feet activity did not occur: Safety/medical concerns (fatigue)  Assist level: Supervision/Verbal cueing Assistive device: Walker-rolling    Walk 10 feet on uneven surface  activity   Assist Walk 10 feet on uneven surfaces activity did not occur: Safety/medical concerns   Assist level: Contact Guard/Touching assist Assistive device: Chemical engineer  Assist Is the patient using a wheelchair?: Yes Type of Wheelchair: Manual    Wheelchair assist level: Supervision/Verbal cueing Max wheelchair distance: 118ft    Wheelchair 50 feet with 2 turns activity    Assist        Assist Level: Supervision/Verbal cueing   Wheelchair 150 feet activity     Assist      Assist Level: Supervision/Verbal cueing   Blood pressure 130/68, pulse 76, temperature 98.4 F (36.9 C), temperature source Oral, resp. rate 20, height 5\' 6"  (1.676 m), weight 76.1 kg, last menstrual period 08/21/2016, SpO2 95 %.  Medical Problem List and Plan: 1.  Debility  functional deficits secondary to proctitis/colitis/GI bleed/multi medical  Con't CIR_ PT and OT 2.  Impaired mobility: continue SCDs at night as per daughter's request.  -DVT/anticoagulation:  Pharmaceutical: Other (comment) Eliquis             -antiplatelet therapy: N/A 3. Left shoulder pain and limited range of motion: MRI shows arthritis- ortho consulted for shoulder injection but decided to continue exercise at this time as this appears to be helping. Continue Neurontin 300 mg nightly  Encourage elevation of feet with ice to ankles at present-we will consider medications if necessary  12/11- pain controlled over weekend- con't regimen 4. Mood: Provide emotional support             -antipsychotic agents: N/A 5. Neuropsych: This patient is capable of making decisions on her own behalf. 6. Skin/Wound Care: Routine skin checks 7. Fluids/Electrolytes/Nutrition: Routine in and outs 8.  Proctitis/colitis/GI bleed.  Follow-up GI Dr. Paulita Fujita.  Completed course of Omnicef and Flagyl.  Hemoccult positive. Per GI conservative management recommended for now.  Hemoglobin 8.3 on 12/2, 8.4 on 12/5, continue to monitor weekly.  12/11- labs in AM 9.  Prior CVA/abnormal MRI.  Recent admission 02/25/2021 to 03/05/2021.  Plan repeat MRI 4 weeks per neurology services 10.  Atrial fibrillation.  Cardiac rate controlled. Continue Eliquis. Magnesium level 1.9 on 11/28 11.  Hypertension.  Increase amlodipine to 10mg   12/11- BP controlled over weekend- con't regimen 12.  Uncontrolled diabetes mellitus.  Hemoglobin A1c greater than 12.  Check blood sugars before meals and at bedtime.  Diabetic teaching CBG (last 3)  Recent Labs    04/01/21 0633 04/01/21 1128 04/01/21 1153  GLUCAP 113* 66* 87  Increase Levemir to 15U Metformin XR 100mg  BID - will hold due to freq loose stools-appear to be improving Novolog 0-6 U added TID with meals as needed.  Novolog 3U added with meals if she eats >50% 12/11- Had 1 episode of  66- hadn't eaten much breakfast- will con't but if drops again, will need to decrease meds 13.  AKI versus CKD.  Placed nursing order to encourage 6-8 glasses of water per day  Creatinine 1.04 on 11/28, 0.97 on 12/5, monitor as needed  12/11- labs in AM 14.  Glaucoma.  Left eye blindness.  [facility] Dr[facility] Catalina [facility].  Continue eyedrops. She asked that Bromidine be discontinued due to burning. I have done so, and asked husband to call Dr. Manuella Ghazi to scheduled outpatient ophthalmology appointment for soon after she leaves here. Messaged Neoma Laming to help get husband permanent handicap placard on Monday.  15.  Urinary retention.  Foley tube removed.  Follow-up outpatient urology as needed 16.  Medical noncompliance.  Counseling 17. Overweight BMI 27.08: provide dietary education.  18. Bilateral lower extremity edema: recommended low sodium diet 19.  Diarrhea:?  Resolved 20. Disposition: HFU scheduled.  LOS: 15 days A FACE TO FACE EVALUATION WAS PERFORMED  Cherye Gaertner 04/01/2021, 1:09 PM

## 2021-04-02 LAB — COMPREHENSIVE METABOLIC PANEL
ALT: 32 U/L (ref 0–44)
AST: 29 U/L (ref 15–41)
Albumin: 2.8 g/dL — ABNORMAL LOW (ref 3.5–5.0)
Alkaline Phosphatase: 94 U/L (ref 38–126)
Anion gap: 7 (ref 5–15)
BUN: 30 mg/dL — ABNORMAL HIGH (ref 6–20)
CO2: 27 mmol/L (ref 22–32)
Calcium: 9.2 mg/dL (ref 8.9–10.3)
Chloride: 106 mmol/L (ref 98–111)
Creatinine, Ser: 0.94 mg/dL (ref 0.44–1.00)
GFR, Estimated: >60 mL/min (ref >60)
Glucose, Bld: 97 mg/dL (ref 70–99)
Potassium: 4.5 mmol/L (ref 3.5–5.1)
Sodium: 140 mmol/L (ref 135–145)
Total Bilirubin: 0.4 mg/dL (ref 0.3–1.2)
Total Protein: 6.3 g/dL — ABNORMAL LOW (ref 6.5–8.1)

## 2021-04-02 LAB — CBC WITH DIFFERENTIAL/PLATELET
Abs Immature Granulocytes: 0.01 10*3/uL (ref 0.00–0.07)
Basophils Absolute: 0 10*3/uL (ref 0.0–0.1)
Basophils Relative: 1 %
Eosinophils Absolute: 0.2 10*3/uL (ref 0.0–0.5)
Eosinophils Relative: 5 %
HCT: 28.3 % — ABNORMAL LOW (ref 36.0–46.0)
Hemoglobin: 8.7 g/dL — ABNORMAL LOW (ref 12.0–15.0)
Immature Granulocytes: 0 %
Lymphocytes Relative: 46 %
Lymphs Abs: 2 10*3/uL (ref 0.7–4.0)
MCH: 27.6 pg (ref 26.0–34.0)
MCHC: 30.7 g/dL (ref 30.0–36.0)
MCV: 89.8 fL (ref 80.0–100.0)
Monocytes Absolute: 0.3 10*3/uL (ref 0.1–1.0)
Monocytes Relative: 8 %
Neutro Abs: 1.7 10*3/uL (ref 1.7–7.7)
Neutrophils Relative %: 40 %
Platelets: 166 10*3/uL (ref 150–400)
RBC: 3.15 MIL/uL — ABNORMAL LOW (ref 3.87–5.11)
RDW: 13.2 % (ref 11.5–15.5)
WBC: 4.3 10*3/uL (ref 4.0–10.5)
nRBC: 0 % (ref 0.0–0.2)

## 2021-04-02 LAB — GLUCOSE, CAPILLARY
Glucose-Capillary: 63 mg/dL — ABNORMAL LOW (ref 70–99)
Glucose-Capillary: 104 mg/dL — ABNORMAL HIGH (ref 70–99)
Glucose-Capillary: 68 mg/dL — ABNORMAL LOW (ref 70–99)
Glucose-Capillary: 134 mg/dL — ABNORMAL HIGH (ref 70–99)
Glucose-Capillary: 297 mg/dL — ABNORMAL HIGH (ref 70–99)
Glucose-Capillary: 230 mg/dL — ABNORMAL HIGH (ref 70–99)

## 2021-04-02 MED ORDER — MAGNESIUM GLUCONATE 500 MG PO TABS
250.0000 mg | ORAL_TABLET | Freq: Every day | ORAL | Status: DC
  Administered 2021-04-02 – 2021-04-03 (×2): 250 mg via ORAL
  Filled 2021-04-02 (×2): qty 1

## 2021-04-02 MED ORDER — INSULIN DETEMIR 100 UNIT/ML ~~LOC~~ SOLN
14.0000 [IU] | Freq: Every day | SUBCUTANEOUS | Status: DC
  Administered 2021-04-02 – 2021-04-03 (×2): 14 [IU] via SUBCUTANEOUS
  Filled 2021-04-02 (×3): qty 0.14

## 2021-04-02 NOTE — Significant Event (Signed)
Hypoglycemic Event  CBG: 63  Treatment: 4 oz juice/soda  Symptoms: None  Follow-up CBG: Time:634 CBG Result:68  Possible Reasons for Event: Unknown  Comments/MD notified: Pt received breakfast tray. Sugar rechecked after finishing meal, follow up sugar 104.   Jennifer [name] Cheri Kearns

## 2021-04-02 NOTE — Progress Notes (Signed)
Inpatient Rehabilitation Discharge Medication Review by a Pharmacist  A complete drug regimen review was completed for this patient to identify any potential clinically significant medication issues.  High Risk Drug Classes Is patient taking? Indication by Medication  Antipsychotic No   Anticoagulant Yes Apixaban for paroxysmal atrial fibrillation  Antibiotic No   Opioid No   Antiplatelet No   Hypoglycemics/insulin Yes Levemir, novolog, Linagliptin for T2DM   Vasoactive Medication Yes Amlodipine for HTN  Chemotherapy No   Other Yes Gabapentin for neuropathy     Type of Medication Issue Identified Description of Issue Recommendation(s)  Drug Interaction(s) (clinically significant)     Duplicate Therapy     Allergy     No Medication Administration End Date     Incorrect Dose     Additional Drug Therapy Needed     Significant med changes from prior encounter (inform family/care partners about these prior to discharge).    Other       Clinically significant medication issues were identified that warrant physician communication and completion of prescribed/recommended actions by midnight of the next day:  Yes (resolved, MD contacted)  Name of provider notified for urgent issues identified: Dr. Doy Hutching; Dr. Leeroy Cha   Provider Method of Notification: Secure chat     Pharmacist comments:    Time spent performing this drug regimen review (minutes):  [place], PharmD, [place], AAHIVP, CPP Infectious Disease Pharmacist 04/02/2021 10:38 AM

## 2021-04-02 NOTE — Progress Notes (Signed)
Occupational Therapy Session Note  Patient Details  Name: Jennifer [name] MRN: [id number] Date of Birth: 1970/09/28  Today's Date: 04/02/2021 OT Individual Time: 1400-1500 OT Individual Time Calculation (min): 60 min    Short Term Goals: Week 2:  OT Short Term Goal 1 (Week 2): STGs=LTGs due to ELOS  Skilled Therapeutic Interventions/Progress Updates:    Pt sidelying with BLE hanging off bed and right AFO partially doffed and twisted.  Pt returned to sitting upright with supervision and redonned AFO and shoe with max assist.  Blocked practice stand pivot transfer EOB <>BSC using RW with max multimodal cues for safe RW mgt and CGA fading to supervision.  Pt transported to tub shower room and block practiced step over tub transfer to shower chair using grab bars.  Pt required min assist to clear bilateral feet over tub lip.  Recommended to pt and son during caregiver education for grab bar installation for fall prevention.  Returned to room and initiated caregiver ed with PT present for cotreat.  Educated pts son via phone call regarding pts need for setup of BSC and safe placement.  Pts son has no questions at this time.  Call bell in reach, seat alarm on.  Therapy Documentation Precautions:  Precautions Precautions: Fall (bil foot drop; need interpreter) Restrictions Weight Bearing Restrictions: Yes   Therapy/Group: Individual Therapy  Ezekiel [place] 04/02/2021, 4:35 PM

## 2021-04-02 NOTE — Discharge Summary (Signed)
Physician Discharge Summary  [name] MRN: [id number] DOB/AGE: 1970-09-17 50 y.o.  Admit date: 03/17/2021 Discharge date: 04/04/2021  Discharge Diagnoses:  Principal Problem:   Debility Active Problems:   Uncontrolled type 2 diabetes mellitus with hyperglycemia (HCC)   Acute blood loss anemia   Labile blood glucose   Chronic kidney disease Proctitis/colitis/GI bleed Prior CVA Atrial fibrillation Hypertension Glaucoma Medical noncompliance  Discharged Condition: Stable  Significant Diagnostic Studies: CT ABDOMEN PELVIS W CONTRAST  Result Date: 03/06/2021 CLINICAL DATA:  Left lower quadrant pain.  Blood in stool. EXAM: CT ABDOMEN AND PELVIS WITH CONTRAST TECHNIQUE: Multidetector CT imaging of the abdomen and pelvis was performed using the standard protocol following bolus administration of intravenous contrast. CONTRAST:  57mL OMNIPAQUE IOHEXOL 350 MG/ML SOLN COMPARISON:  02/25/2021 FINDINGS: Lower chest: Unremarkable Hepatobiliary: No suspicious focal abnormality within the liver parenchyma. There is no evidence for gallstones, gallbladder wall thickening, or pericholecystic fluid. No intrahepatic or extrahepatic biliary dilation. Pancreas: No focal mass lesion. No dilatation of the main duct. No intraparenchymal cyst. No peripancreatic edema. Spleen: No splenomegaly. No focal mass lesion. Adrenals/Urinary Tract: No adrenal nodule or mass. Kidneys unremarkable. No evidence for hydroureter. Foley catheter decompresses the urinary bladder. Gas in the bladder lumen is compatible with the instrumentation. Stomach/Bowel: Stomach is unremarkable. No gastric wall thickening. No evidence of outlet obstruction. Duodenum is normally positioned as is the ligament of Treitz. No small bowel wall thickening. No small bowel dilatation. The terminal ileum is normal. The appendix is normal. There is mild wall thickening in the descending colon likely related to underdistention. Mild  nonspecific wall thickening in the sigmoid colon with more prominent edematous wall thickening visible in the rectum (image 83/2) with perirectal and presacral edema. Vascular/Lymphatic: No abdominal aortic aneurysm. There is no gastrohepatic or hepatoduodenal ligament lymphadenopathy. No retroperitoneal or mesenteric lymphadenopathy. No pelvic sidewall lymphadenopathy. Reproductive: The uterus is unremarkable.  There is no adnexal mass. Other: No intraperitoneal free fluid. Musculoskeletal: No worrisome lytic or sclerotic osseous abnormality. IMPRESSION: 1. Prominent edematous wall thickening in the rectum with perirectal and presacral edema. Imaging features suggest infectious/inflammatory proctitis. There is probably some associated wall thickening in the site distal sigmoid colon in features may reflect a left-sided infectious/inflammatory colitis 2. Foley catheter decompresses the urinary bladder. Gas in the bladder lumen is compatible with the instrumentation. Electronically Signed   By: Misty Stanley M.D.   On: 03/06/2021 09:32   MR SHOULDER LEFT WO CONTRAST  Result Date: 03/22/2021 CLINICAL DATA:  Shoulder pain, rotator cuff disorder suspected, xray done suspected left rotator cuff tear EXAM: MRI OF THE LEFT SHOULDER WITHOUT CONTRAST TECHNIQUE: Multiplanar, multisequence MR imaging of the shoulder was performed. No intravenous contrast was administered. COMPARISON:  Left shoulder radiograph 03/18/2021 FINDINGS: Rotator cuff: The supraspinatus and infraspinatus tendons are intact. Teres minor tendon is intact. Subscapularis tendon is intact. Muscles: No muscle atrophy or edema. No intramuscular fluid collection or hematoma. Biceps Long Head: Intraarticular and extraarticular portions of the biceps tendon are intact. Acromioclavicular Joint: Mild arthropathy of the acromioclavicular joint with periarticular bony edema. No significant subacromial/subdeltoid bursal fluid. Glenohumeral Joint: No significant  joint effusion.  Mild chondrosis. Labrum: Grossly intact, but evaluation is limited by lack of intraarticular fluid/contrast. Bones: No fracture or dislocation. No aggressive osseous lesion. Other: No fluid collection or hematoma. 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 No evidence of rotator cuff tear.  No evidence of labral tear. Electronically Signed   By: Maurine Simmering  M.D.   On: 03/22/2021 08:22   DG Shoulder Left  Result Date: 03/18/2021 CLINICAL DATA:  Pain. EXAM: LEFT SHOULDER - 2+ VIEW COMPARISON:  Left shoulder x-ray 03/08/2021. FINDINGS: There is no evidence of fracture or dislocation. There are mild degenerative changes of the glenohumeral joint with osteophyte formation. Joint spaces are maintained. Soft tissues are unremarkable. IMPRESSION: 1. No acute bony abnormality. 2. Mild degenerative changes. Electronically Signed   By: Ronney Asters M.D.   On: 03/18/2021 16:20    Labs:  Basic Metabolic Panel: Recent Labs  Lab 04/02/21 0645  NA 140  K 4.5  CL 106  CO2 27  GLUCOSE 97  BUN 30*  CREATININE 0.94  CALCIUM 9.2    CBC: Recent Labs  Lab 04/02/21 0645  WBC 4.3  NEUTROABS 1.7  HGB 8.7*  HCT 28.3*  MCV 89.8  PLT 166    CBG: Recent Labs  Lab 04/02/21 2059 04/03/21 0535 04/03/21 1152 04/03/21 1641 04/03/21 2115  GLUCAP 230* 104* 126* 251* 232*   Family history.  Father with diabetes.  Denies any colon cancer esophageal cancer or rectal cancer  Brief HPI:   Jennifer [name] is a 50 y.o. right-handed limited English speaking female with history complicated by atrial fibrillation maintained on Eliquis, non-STEMI 10/22, left eye visual impairment/glaucoma followed by Dr. Manuella Ghazi [facility], hypertension, hyperlipidemia, diabetes mellitus, COVID-19 06/15/2020, closed trimalleolar ankle fracture 05/02/2020, medical noncompliance.  Recent admission 02/25/2021-03/05/2021 for multifocal CVA with concern for infectious  inflammatory etiology as well as possible enhancing occipital lesion and did receive lumbar puncture and ruled out meningitis as well as subarachnoid hemorrhage.  Hospital course complicated by urinary retention as well as AKI.  MRI of the brain 02/28/2021 showed prominent diffuse FLAIR hyperintensity within the cerebral foci representing proteinaceous material related to meningitis or less likely subarachnoid hemorrhage.  Multiple punctate infarcts bilateral cerebral hemispheres noted and cerebellar hemisphere and an unchanged appearance of 2 enhancing lesions of the left occipital lobe representing possible subacute infarct with hemorrhagic transformation versus malignancy.  Plan was for rescheduling of repeat imaging in approxi-4 weeks with neurology follow-up as there was concerns for severely uncontrolled vascular factors.  Per chart review lives with her husband and children.  Required some assistance for lower body ADLs.  Presented 03/06/2021 with rectal bleeding and frank blood clots.  She was having some left flank pain.  CT of the abdomen pelvis showed prominent edematous wall thickening in the rectum with perirectal and presacral edema.  Imaging suggesting infectious inflammatory proctitis.  Gastroenterology services consulted Dr. Paulita Fujita.  Placed on intravenous Cipro and Flagyl changed to ceftriaxone and Flagyl and later to [facility] and Flagyl x10-day course.  Admission chemistries hemoglobin 10.2 compared to 8.79 days prior glucose 181 BUN 22 creatinine 1.15 fecal occult blood positive.  Conservative care per gastroenterology services no current plan for colonoscopy.  Patient's Eliquis initially held discussed with neurology services Dr. Leonel Ramsay as well as gastroenterology services and was later cleared to resume 03/12/2021.  Monitored for blood loss hemoglobin latest noted to be 8.2.  In regards to patient's AKI responded to IV fluids latest creatinine 1.0.  Patient did have some initial urinary  retention Foley tube had been removed.  Urine culture did grow 80,000 yeast received 14 days of Diflucan.  Uncontrolled diabetes mellitus hemoglobin A1c 12.1 insulin therapy as directed.  Her Glucophage was held due to some loose stools.  She did have bouts of hypotension and antihypertensive medications were adjusted.  Therapy evaluations completed  due to patient decreased functional mobility was admitted for a comprehensive rehab program.   Hospital Course: Orli Degrave was admitted to rehab 03/17/2021 for inpatient therapies to consist of PT, ST and OT at least three hours five days a week. Past admission physiatrist, therapy team and rehab RN have worked together to provide customized collaborative inpatient rehab.  Pertaining to patient's debility related to proctitis/colitis/GI bleed/multi medical.  Patient continued to participate with therapies.  She did have some initial left shoulder pain and limited range of motion MRI showed arthritis orthopedic consulted for shoulder injection but decided to continue exercise at this time and monitored.  She was placed on low-dose Neurontin.  In regards to patient's proctitis/GI bleed follow-up GI services Dr. Paulita Fujita she completed course of Omnicef and Flagyl.  Hemoglobin hematocrit remained stable.  Atrial fibrillation cardiac rate controlled she had been cleared to resume Eliquis therapy and monitored.  Blood pressure somewhat soft and remained on Norvasc.  Uncontrolled diabetes mellitus hemoglobin A1c greater than 12 insulin therapy as directed with full diabetic teaching.  AKI versus CKD with latest creatinine 0.97.  She did have a history of glaucoma followed at [facility] by Dr. Brigitte Pulse.  Obesity BMI 27.08 dietary follow-up.  Bouts of urinary retention resolved Foley catheter tube removed.  Patient documented history of medical noncompliance receiving counsel guards of maintaining medical regimen.   Blood pressures were monitored on TID basis and soft and  monitored  Diabetes has been monitored with ac/hs CBG checks and SSI was use prn for tighter BS control.    Rehab course: During patient's stay in rehab weekly team conferences were held to monitor patient's progress, set goals and discuss barriers to discharge. At admission, patient required minimal guard 60 feet rolling walker minimal guard sit to stand  Physical exam.  Blood pressure 133/66 pulse 83 temperature 99 respirations 18 oxygen saturations 100% room air Constitutional.  No acute distress HEENT.  Left eye blind Neck.  Supple nontender no JVD without thyromegaly Cardiac regular rate and rhythm any extra sounds or murmur heard Abdomen.  Soft nontender positive bowel sounds without rebound Respiratory effort normal no respiratory distress without wheeze Skin.  Intact Neurologic.  Alert makes eye contact with examiner.  Limited English speaking primarily speaks Arabic.  Follows commands.  He/She  has had improvement in activity tolerance, balance, postural control as well as ability to compensate for deficits. He/She has had improvement in functional use RUE/LUE  and RLE/LLE as well as improvement in awareness.  Transfers sit to stand supervision.  Ambulates 150 feet rolling walker contact-guard.  Perform multiple sit to stand transfers with supervision.  Patient ambulates to the bathroom toilet transfers rolling walker close supervision.  She did require some assistance for lower body ADLs.  She was wearing an AFO brace for foot drop needing some assistance to don and doff.  Full family teaching completed plan discharged home       Disposition: Discharged home   Diet: Diabetic diet  Special Instructions: No driving smoking or alcohol  Medications at discharge 1.  Tylenol as needed 2.  Norvasc 10 mg p.o. daily 3.  Eliquis 5 mg p.o. twice daily 4.  Cosopt ophthalmic solution 1 drop left eye twice daily 5.  Neurontin 300 mg p.o. nightly 6.  NovoLog 3 units 3 times daily with  meals 7.  Levemir 16 units nightly 8.  Tradjenta 5 mg p.o. daily 9.  Multivitamin daily  30-35 minutes were spent completing discharge summary and discharge  planning  Discharge Instructions     Ambulatory referral to Neurology   Complete by: As directed    An appointment is requested in approximately: Call for appointment 4 weeks for repeat CT/MRI for follow-up CVA/brain lesion        Follow-up Information     Raulkar, Clide Deutscher, MD Follow up.   Specialty: Physical Medicine and Rehabilitation Why: 05/29/21 please arrive at 1:20pm for 1:40pm appointment, thank you! Contact information: [address] [place] [address] [phone]         Arta Silence, MD Follow up.   Specialty: Gastroenterology Why: Call for appointment to discuss possible nned for colonoscopy Contact information: [address] [place] [address] [phone]         Festus Aloe, MD Follow up.   Specialty: Urology Why: As needed for urinary retention Contact information: [place] [address] [phone]         Hiram Gash, MD Follow up.   Specialty: Orthopedic Surgery Why: call for appointment in regards to shoulder pain Contact information: [address] [place] [address] [phone]                 Signed: Cathlyn Parsons 04/04/2021, 5:13 AM

## 2021-04-02 NOTE — Plan of Care (Signed)
  Problem: RH Balance Goal: LTG Patient will maintain dynamic standing balance (PT) Description: LTG:  Patient will maintain dynamic standing balance with assistance during mobility activities (PT) Flowsheets (Taken 04/02/2021 0739) LTG: Pt will maintain dynamic standing balance during mobility activities with:: Supervision/Verbal cueing Note: Downgraded due to progress   Problem: RH Furniture Transfers Goal: LTG Patient will perform furniture transfers w/assist (OT/PT) Description: LTG: Patient will perform furniture transfers  with assistance (OT/PT). Flowsheets (Taken 04/02/2021 0739) LTG: Pt will perform furniture transfers with assist:: Supervision/Verbal cueing Note: Downgraded due to slow progress   Problem: RH Wheelchair Mobility Goal: LTG Patient will propel w/c in controlled environment (PT) Description: LTG: Patient will propel wheelchair in controlled environment, # of feet with assist (PT) Flowsheets (Taken 04/02/2021 0739) LTG: Propel w/c distance in controlled environment: 150 Note: Downgraded due to slow progress

## 2021-04-02 NOTE — Progress Notes (Signed)
PROGRESS NOTE   Subjective/Complaints: No new complaints this morning.  Noted to be hypoglycemic again this morning, appreciate diabetes corrdinator recs regarding decreasing levemir. No issues overnight.   ROS:  Pt denies SOB, abd pain, CP, N/V/C/D, and vision changes   Objective:   No results found. Recent Labs    04/02/21 0645  WBC 4.3  HGB 8.7*  HCT 28.3*  PLT 166    Recent Labs    04/02/21 0645  NA 140  K 4.5  CL 106  CO2 27  GLUCOSE 97  BUN 30*  CREATININE 0.94  CALCIUM 9.2      Intake/Output Summary (Last 24 hours) at 04/02/2021 1015 Last data filed at 04/02/2021 0900 Gross per 24 hour  Intake 120 ml  Output --  Net 120 ml        Physical Exam: Vital Signs Blood pressure (!) 152/71, pulse 76, temperature 98.2 F (36.8 C), temperature source Oral, resp. rate 18, height 5\' 6"  (1.676 m), weight 76.1 kg, last menstrual period 08/21/2016, SpO2 100 %. Gen: no distress, normal appearing HEENT: oral mucosa pink and moist, NCAT Cardio: Reg rate Chest: normal effort, normal rate of breathing Abd: soft, non-distended Ext: no edema Psych: pleasant, normal affect Skin: Warm and dry.  Intact. Psych: Normal mood.  Normal behavior. Musc: Bilateral ankle extremities.  No tenderness in extremities. Neuro: Alert Bilateral lower extremity foot drop, unchanged  Assessment/Plan: 1. Functional deficits which require 3+ hours per day of interdisciplinary therapy in a comprehensive inpatient rehab setting. Physiatrist is providing close team supervision and 24 hour management of active medical problems listed below. Physiatrist and rehab team continue to assess barriers to discharge/monitor patient progress toward functional and medical goals  Care Tool:  Bathing              Bathing assist Assist Level: Minimal Assistance - Patient > 75%     Upper Body Dressing/Undressing Upper body dressing    What is the patient wearing?: Pull over shirt    Upper body assist Assist Level: Supervision/Verbal cueing    Lower Body Dressing/Undressing Lower body dressing      What is the patient wearing?: Pants     Lower body assist Assist for lower body dressing: Minimal Assistance - Patient > 75%     Toileting Toileting    Toileting assist Assist for toileting: Minimal Assistance - Patient > 75%     Transfers Chair/bed transfer  Transfers assist     Chair/bed transfer assist level: Supervision/Verbal cueing     Locomotion Ambulation   Ambulation assist      Assist level: Supervision/Verbal cueing Assistive device: Walker-rolling Max distance: 120ft   Walk 10 feet activity   Assist     Assist level: Supervision/Verbal cueing Assistive device: Walker-rolling   Walk 50 feet activity   Assist Walk 50 feet with 2 turns activity did not occur: Safety/medical concerns (fatigued)  Assist level: Supervision/Verbal cueing Assistive device: Walker-rolling    Walk 150 feet activity   Assist Walk 150 feet activity did not occur: Safety/medical concerns (fatigue)  Assist level: Supervision/Verbal cueing Assistive device: Walker-rolling    Walk 10 feet on uneven surface  activity  Assist Walk 10 feet on uneven surfaces activity did not occur: Safety/medical concerns   Assist level: Contact Guard/Touching assist Assistive device: Walker-rolling   Wheelchair     Assist Is the patient using a wheelchair?: Yes Type of Wheelchair: Manual    Wheelchair assist level: Supervision/Verbal cueing Max wheelchair distance: 137ft    Wheelchair 50 feet with 2 turns activity    Assist        Assist Level: Supervision/Verbal cueing   Wheelchair 150 feet activity     Assist      Assist Level: Supervision/Verbal cueing   Blood pressure (!) 152/71, pulse 76, temperature 98.2 F (36.8 C), temperature source Oral, resp. rate 18, height 5\' 6"   (1.676 m), weight 76.1 kg, last menstrual period 08/21/2016, SpO2 100 %.  Medical Problem List and Plan: 1.  Debility functional deficits secondary to proctitis/colitis/GI bleed/multi medical  Continue CIR 2.  Impaired mobility: continue SCDs at night as per daughter's request.  -DVT/anticoagulation:  Pharmaceutical: Other (comment) Eliquis             -antiplatelet therapy: N/A 3. Left shoulder pain and limited range of motion: MRI shows arthritis- ortho consulted for shoulder injection but decided to continue exercise at this time as this appears to be helping. Continue Neurontin 300 mg nightly  Encourage elevation of feet with ice to ankles at present-we will consider medications if necessary  12/11- pain controlled over weekend- con't regimen 4. Mood: Provide emotional support             -antipsychotic agents: N/A 5. Neuropsych: This patient is capable of making decisions on her own behalf. 6. Skin/Wound Care: Routine skin checks 7. Fluids/Electrolytes/Nutrition: Routine in and outs 8.  Proctitis/colitis/GI bleed.  Follow-up GI Dr. Paulita Fujita.  Completed course of Omnicef and Flagyl.  Hemoccult positive. Per GI conservative management recommended for now.  Hemoglobin 8.3 on 12/2, 8.4 on 12/5, 8.7 on 12/12, continue to monitor outpatient 9.  Prior CVA/abnormal MRI.  Recent admission 02/25/2021 to 03/05/2021.  Plan repeat MRI 4 weeks per neurology services 10.  Atrial fibrillation.  Cardiac rate controlled. Continue Eliquis. Magnesium level 1.9 on 11/28 11.  Hypertension.  Continue amlodipine to 10mg . Add magnesium gluconate 250mg  HS.  12.  Uncontrolled diabetes mellitus.  Hemoglobin A1c greater than 12.  Check blood sugars before meals and at bedtime.  Diabetic teaching CBG (last 3)  Recent Labs    04/02/21 0616 04/02/21 0633 04/02/21 0659  GLUCAP 63* 68* 104*  Decrease levemir to 14U Metformin XR 100mg  BID - will hold due to freq loose stools-appear to be improving Novolog 0-6 U added  TID with meals as needed.  Novolog 3U added with meals if she eats >50% 12/11- Had 1 episode of 66- hadn't eaten much breakfast- will con't but if drops again, will need to decrease meds 13.  AKI versus CKD.  Placed nursing order to encourage 6-8 glasses of water per day  Creatinine 1.04 on 11/28, 0.97 on 12/5, monitor as needed  12/11- labs in AM 14.  Glaucoma.  Left eye blindness.  Follow Dr. Catalina Antigua [facility].  Continue eyedrops. She asked that Bromidine be discontinued due to burning. I have done so, and asked husband to call Dr. Manuella Ghazi to scheduled outpatient ophthalmology appointment for soon after she leaves here. Messaged Neoma Laming to help get husband permanent handicap placard on Monday.  15.  Urinary retention.  Foley tube removed.  Follow-up outpatient urology as needed 16.  Medical noncompliance.  Counseling 17. Overweight  BMI 27.08: provide dietary education.  18. Bilateral lower extremity edema: recommended low sodium diet 19.  Diarrhea:?  Resolved 20. Disposition: HFU scheduled.   LOS: 16 days A FACE TO FACE EVALUATION WAS PERFORMED  Yordi Krager P Yvett Rossel 04/02/2021, 10:15 AM

## 2021-04-02 NOTE — Progress Notes (Signed)
Inpatient Diabetes Program Recommendations  AACE/ADA: New Consensus Statement on Inpatient Glycemic Control  Target Ranges:  Prepandial:   less than 140 mg/dL      Peak postprandial:   less than 180 mg/dL (1-2 hours)      Critically ill patients:  140 - 180 mg/dL    Latest Reference Range & Units 04/02/21 06:16 04/02/21 06:33 04/02/21 06:59  Glucose-Capillary 70 - 99 mg/dL 63 (L) 68 (L) 104 (H)    Latest Reference Range & Units 04/01/21 06:33 04/01/21 11:28 04/01/21 11:53 04/01/21 16:39 04/01/21 20:59  Glucose-Capillary 70 - 99 mg/dL 113 (H) 66 (L) 87 204 (H) 287 (H)   Review of Glycemic Control  Diabetes history: DM2 Outpatient Diabetes medications: Levemir 15 units QHS, Novolog 3 units TID, Tradjenta 5 mg QD, Metformin 1000 mg BID Current orders for Inpatient glycemic control: Levmemir 16 units QHS, Novolog 0-6 units TID with meals, Novolog 0-5 units QHS, Novolog 3 units TID with meals, Tradjenta 5 mg QD   Inpatient Diabetes Program Recommendations:    Insulin: Glucose down to 66 mg/dl at 11:28 on 04/01/21 (after getting Novolog 3 units for meal coverage but unclear if patient ate at least 50% of meal). Fasting glucose 63 mg/dl today. Please consider decreasing Levemir to 14 units QHS. NURSING: please be sure patient eats at least 50% of meals before giving meal coverage insulin.  Thanks, Barnie Alderman, RN, MSN, CDE Diabetes Coordinator Inpatient Diabetes Program [phone] (Team Pager from 8am to 5pm)

## 2021-04-02 NOTE — Progress Notes (Signed)
Physical Therapy Session Note  Patient Details  Name: Jennifer [name] MRN: [id number] Date of Birth: 03/14/1971  Today's Date: 04/02/2021 PT Individual Time: 1535-1610 PT Individual Time Calculation (min): 35 min   Short Term Goals: Week 1:  PT Short Term Goal 1 (Week 1): pt will transfer bed>< wc with CGA PT Short Term Goal 2 (Week 1): pt will propel wc x 150' with supervision and cues for steering on level tile PT Short Term Goal 3 (Week 1): pt will move sit>< stand iwht CGA PT Short Term Goal 4 (Week 1): pt will perform gait with RW on level tile x 100', CGA PT Short Term Goal 5 (Week 1): pt will be assessed for bil AFOs Week 2:  PT Short Term Goal 1 (Week 2): Pt will complete bed mobility with supervision PT Short Term Goal 2 (Week 2): Pt will complete bed<>chair transfers with CGA consistently PT Short Term Goal 3 (Week 2): Pt will ambulate 124ft with CGA and LRAD PT Short Term Goal 4 (Week 2): Pt will navigate curb with RW and minA Week 3:  PT Short Term Goal 1 (Week 3): STG = LTG due to ELOS  Skilled Therapeutic Interventions/Progress Updates:  Patient seated upright in w/c on entrance to room. Patient alert and agreeable to PT session.   Patient with no pain complaint throughout session.  Therapeutic Activity: Bed Mobility: Patient performed sit-->supine at end of session with supervision/ MinA for BLE as she is fatigued. VC/ tc required for increased effort. Transfers: Patient performed sit<>stand and stand pivot transfers throughout session with close supervision. No vc required for technique. Stand pivot at end of session from w/c to bed with no AD and pt completes with close supervision.   Gait Training:  Patient guided in curb step training using RW to ascend/ descend 4" curb step. She is able to perform 5x with first attempt requiring MinA for LLE to clear step and then CGA to complete throughout all attempts. She is able to safely descend lowering LLE first. This improves  throughout all attempts. Max vc required for first attempt and by 5th attempt, no cueing req'd.   Bil shoes/ AFOs doffed with MaxA at end of session for time. Patient supine  in bed at end of session with brakes locked, bed alarm set, and all needs within reach.     Therapy Documentation Precautions:  Precautions Precautions: Fall (bil foot drop; need interpreter) Restrictions Weight Bearing Restrictions: Yes General:   Pain:  No pain complaint this session.   Therapy/Group: Individual Therapy  Alger Simons PT, DPT 04/02/2021, 4:37 PM

## 2021-04-02 NOTE — Progress Notes (Signed)
Physical Therapy Weekly Progress Note  Patient Details  Name: Jennifer [name] MRN: [id number] Date of Birth: 1971-01-22  Beginning of progress report period: March 26, 2021 End of progress report period: April 02, 2021  Today's Date: 04/02/2021 PT Individual Time: 1000-1055  PT Individual Time Calculation (min): 55 min    PT Co-Treatment Time: 1515-1530 PT Co-Treatment Time Calculation (min): 15 min  Patient has met 2 of 3 short term goals. Pt making appropriate progress towards goals. She had received B AFO's for foot drop which is assisting her stability in gait. Overall, she requires supervision bed mobility, close supervision for stand<>pivot transfers with RW, ambulating >139f with supervision and RW, and requires min/modA and RW for managing 4inch curbs. Anticipate family to assist with home entrance with navigating 4inch threshold as well as donning/doffing her AFO's. Family training ongoing and completed today for prep of DC on 12/14  Patient continues to demonstrate the following deficits muscle weakness and muscle joint tightness, decreased cardiorespiratoy endurance, unbalanced muscle activation and motor apraxia, decreased problem solving, decreased safety awareness, and decreased memory, and decreased standing balance, decreased postural control, hemiplegia, and decreased balance strategies and therefore will continue to benefit from skilled PT intervention to increase functional independence with mobility.  Patient progressing toward long term goals.  Continue plan of care.  PT Short Term Goals Week 3:  PT Short Term Goal 1 (Week 3): STG = LTG due to ELOS  Skilled Therapeutic Interventions/Progress Updates:      1st session: Pt seated EOB to start session with son at the bedside, assisting in donning AFO's and shoes. Interpreter arriving later in session to assist with communication. With son, spent time discussing pt's current mobiltiy status, PT goals, PT POC, role of  f/u therapies, using RW at all times, fall prevention strategies, etc. Son asking appropriate questions and all addressed. Informed the son of scheduled family ed in the P.M. which he was unaware of - reports himself or his sister will be here for that.   Pt denies pain. Completed sit<>stand to RW with supervision and ambulated ~1332fwith supervision and RW to [facility]olin[facility] Rehabilitation[facility]. VC for reducing reliance of UE support through RW to improve weightbearing in LE for strengthening purposes.   Transported remaining distance to 38M rehab gym in the w/c for time and energy conservation. Focused remainder of session on curb/threshold training for home entrance. She completed 2x2 (seated rest) curb (4inch) transfers with RW and minA. Demonstration for understanding of safety with moderate carryover as she continued to require mod to max instructional cues for safety during transfer - minA only needed for aiding in lifting LLE onto the step. Safety cues needed to maintain proximity to RW during transfer to avoid falls risk.   Returned upstairs to her room where she remained seated in w/c with safety belt alarm on and all needs within reach. Informed of upcoming therapy schedule to assist with readiness.   2nd session: Pt greeted seated in w/c. Session co-tx with OT with individual billing time reflected above. Both therapies collaborated for family education with the son who was called on pt's cell phone. Interpreter also present to assist with communication as needed. With son, reinforced importance of wearing AFO's during day WITH shoes on (!), ambulating with supervision only and using w/c for household mobility otherwise, managing doorway threshold with RW (follow up therapist at 3:30 to review as well), and f/u therapy rec's. All questions/concerns addressed and both son/pt thankful for care and rehab. Pt concluded  session seated in w/c with all needs met, awaiting upcoming PT session.  Therapy  Documentation Precautions:  Precautions Precautions: Fall (bil foot drop; need interpreter) Restrictions Weight Bearing Restrictions: Yes General:    Therapy/Group: Individual Therapy  Alger Simons 04/02/2021, 7:34 AM

## 2021-04-03 LAB — FUNGUS CULTURE WITH STAIN

## 2021-04-03 LAB — GLUCOSE, CAPILLARY
Glucose-Capillary: 104 mg/dL — ABNORMAL HIGH (ref 70–99)
Glucose-Capillary: 126 mg/dL — ABNORMAL HIGH (ref 70–99)
Glucose-Capillary: 251 mg/dL — ABNORMAL HIGH (ref 70–99)
Glucose-Capillary: 232 mg/dL — ABNORMAL HIGH (ref 70–99)

## 2021-04-03 LAB — FUNGUS CULTURE RESULT

## 2021-04-03 LAB — FUNGAL ORGANISM REFLEX

## 2021-04-03 MED ORDER — NOVOLOG FLEXPEN 100 UNIT/ML ~~LOC~~ SOPN: 3.0000 [IU] | PEN_INJECTOR | Freq: Three times a day (TID) | SUBCUTANEOUS | 11 refills | Status: DC

## 2021-04-03 MED ORDER — LINAGLIPTIN 5 MG PO TABS: 5.0000 mg | ORAL_TABLET | Freq: Every day | ORAL | 0 refills | Status: DC

## 2021-04-03 MED ORDER — APIXABAN 5 MG PO TABS: 5.0000 mg | ORAL_TABLET | Freq: Two times a day (BID) | ORAL | 0 refills | Status: DC

## 2021-04-03 MED ORDER — AMLODIPINE BESYLATE 10 MG PO TABS: 10.0000 mg | ORAL_TABLET | Freq: Every day | ORAL | 0 refills | Status: DC

## 2021-04-03 MED ORDER — GABAPENTIN 300 MG PO CAPS: 300.0000 mg | ORAL_CAPSULE | Freq: Every day | ORAL | 3 refills | Status: DC

## 2021-04-03 MED ORDER — BRIMONIDINE TARTRATE 0.2 % OP SOLN: 1.0000 [drp] | Freq: Three times a day (TID) | OPHTHALMIC | 12 refills | Status: DC

## 2021-04-03 MED ORDER — ACETAMINOPHEN 325 MG PO TABS: 650.0000 mg | ORAL_TABLET | Freq: Four times a day (QID) | ORAL | Status: AC | PRN

## 2021-04-03 MED ORDER — MAGNESIUM GLUCONATE 500 MG PO TABS: 250.0000 mg | ORAL_TABLET | Freq: Every day | ORAL | 0 refills | Status: DC

## 2021-04-03 MED ORDER — CALCIUM CITRATE-VITAMIN D 315-200 MG-UNIT PO TABS: 2.0000 | ORAL_TABLET | Freq: Two times a day (BID) | ORAL | 3 refills | Status: DC

## 2021-04-03 MED ORDER — DORZOLAMIDE HCL-TIMOLOL MAL 2-0.5 % OP SOLN: 1.0000 [drp] | Freq: Two times a day (BID) | OPHTHALMIC | 12 refills | Status: DC

## 2021-04-03 MED ORDER — INSULIN DETEMIR 100 UNIT/ML FLEXPEN: 14.0000 [IU] | PEN_INJECTOR | Freq: Every day | SUBCUTANEOUS | 11 refills | Status: DC

## 2021-04-03 NOTE — Progress Notes (Signed)
Occupational Therapy Discharge Summary  Patient Details  Name: Jennifer [name] MRN: [id number] Date of Birth: 04-30-1970  Today's Date: 04/03/2021 OT Individual Time: 0900-1000 OT Individual Time Calculation (min): 60 min    Patient has met 5 of 7 long term goals due to improved activity tolerance, improved balance, postural control, ability to compensate for deficits, functional use of  RIGHT lower, LEFT upper, and LEFT lower extremity, improved attention, improved awareness, and improved coordination.  Patient to discharge at overall Supervision level.  Patient's care partner is independent to provide the necessary physical and cognitive assistance at discharge.    Reasons goals not met: Pt requires min assist for tub transfer due to pt needing assist to clear bilateral feet over lip of tub. Pt politely declined training on tub transfers with multiple attempts by therapist.  Her son is able to assist her with tub transfer as needed.  Pt also requires min assist for LB dressing due to difficulty donning AFO needing mod assist for this to complete.  Son also trained on needed assist to donn AFO.    Recommendation:  Patient will benefit from ongoing skilled OT services in home health setting to continue to advance functional skills in the area of BADL.  Equipment: No equipment provided  Reasons for discharge: treatment goals met  Patient/family agrees with progress made and goals achieved: Yes  Skilled Intervention:  Pt semi reclined in bed, no c/o pain, interpreter present to assist with communication. Donned AFO and bilateral shoes requiring mod assist for right due to AFO tight fit in shoe.   Pt completed blocked practice stand pivot transfers EOB to [facility] with review of safe RW mgt.  Required supervision initially and able to fade to mod I.  Pt also completed sit to stand, ambulation within room using RW and stand to sit at EOB with close supervision using same RW mgt safety techniques  reviewed previously.  Husband arrived and reported concern regarding pts safety when she is alone.  Reviewed with pts husband recommendation for pt to function at w/c level during time when she is alone and only perform stand pivot using RW with AFO and bilateral shoes donned to complete toileting as needed with BSC placed at her side in the morning when son assists with ADLs.  Also educated husband on pts need for daily reminders of this to encourage follow through.  Pts husband reported good understanding.  Direct hand off to PT.     OT Discharge Precautions/Restrictions  Precautions Precautions: Fall Precaution Comments: L eye blindness, bilateral foot drop, speaks Arabic Pain Pain Assessment Pain Scale: 0-10 Pain Score: 0-No pain ADL ADL Eating: Modified independent Where Assessed-Eating: Chair Grooming: Modified independent Where Assessed-Grooming: Standing at sink Upper Body Bathing: Supervision/safety Where Assessed-Upper Body Bathing: Shower Lower Body Bathing: Supervision/safety Upper Body Dressing: Modified independent (Device) Where Assessed-Upper Body Dressing: Edge of bed Lower Body Dressing: Minimal assistance (donn AFO requires assist) Where Assessed-Lower Body Dressing: Edge of bed Toileting: Modified independent Where Assessed-Toileting: Bedside Commode Toilet Transfer: Modified independent Toilet Transfer Method: Arts development officer: Radiographer, therapeutic: Minimal assistance Tub/Shower Transfer Method: Optometrist: Shower seat with back Vision Baseline Vision/History: 2 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) Patient Visual Report: No change from baseline Vision Assessment?: Yes Eye Alignment: Impaired (comment) Ocular Range of Motion: Restricted on the left Tracking/Visual Pursuits: Decreased smoothness of eye movement to LEFT superior field;Decreased smoothness of eye movement to LEFT inferior field;Other  (comment) Perception  Perception:  Within Functional Limits Praxis Praxis: Intact Cognition Overall Cognitive Status: History of cognitive impairments - at baseline Arousal/Alertness: Awake/alert Orientation Level: Oriented X4 Year: 2022 Month: November Day of Week: Correct Attention: Focused;Sustained;Selective Focused Attention: Appears intact Sustained Attention: Appears intact Selective Attention: Appears intact Memory: Impaired Memory Impairment: Decreased recall of new information;Decreased short term memory Decreased Short Term Memory: Verbal basic;Functional basic Immediate Memory Recall: Sock;Blue;Bed Memory Recall Sock: Without Cue Memory Recall Blue: Without Cue Memory Recall Bed: Without Cue Awareness: Appears intact Problem Solving: Impaired Problem Solving Impairment: Functional basic Safety/Judgment: Impaired Comments: h/o frequent falls at home Sensation Sensation Light Touch: Impaired by gross assessment Hot/Cold: Appears Intact Proprioception: Appears Intact Stereognosis: Not tested Additional Comments: 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 Coordination Gross Motor Movements are Fluid and Coordinated: No Fine Motor Movements are Fluid and Coordinated: No Finger Nose Finger Test: WFL BUE Motor  Motor Motor: Within Functional Limits Motor - Discharge Observations: Generalized weakness and deconditioning - improved since date of evaluation Mobility  Bed Mobility Bed Mobility: Rolling Right;Rolling Left;Sit to Supine;Supine to Sit Rolling Right: Independent Rolling Left: Independent Supine to Sit: Independent Sit to Supine: Independent Transfers Sit to Stand: Independent with assistive device Stand to Sit: Independent with assistive device  Trunk/Postural Assessment  Cervical Assessment Cervical Assessment: Within Functional Limits Thoracic Assessment Thoracic Assessment: Within Functional Limits Lumbar Assessment Lumbar Assessment:  Within Functional Limits Postural Control Postural Control: Deficits on evaluation (+ Gowers on standing)  Balance Balance Balance Assessed: Yes Static Sitting Balance Static Sitting - Balance Support: No upper extremity supported;Feet supported Static Sitting - Level of Assistance: 7: Independent Dynamic Sitting Balance Dynamic Sitting - Balance Support: No upper extremity supported;Feet supported Dynamic Sitting - Level of Assistance: 6: Modified independent (Device/Increase time) Static Standing Balance Static Standing - Balance Support: Bilateral upper extremity supported Static Standing - Level of Assistance: 6: Modified independent (Device/Increase time) Dynamic Standing Balance Dynamic Standing - Balance Support: Bilateral upper extremity supported;During functional activity Dynamic Standing - Level of Assistance: 4: Min assist Extremity/Trunk Assessment RUE Assessment RUE Assessment: Within Functional Limits     Ezekiel Slocumb 04/03/2021, 3:25 PM

## 2021-04-03 NOTE — Discharge Summary (Signed)
Physical Therapy Discharge Summary  Patient Details  Name: Jennifer [name] MRN: [id number] Date of Birth: 02/21/1971  Patient has met 10 of 11 long term goals due to improved activity tolerance, improved balance, improved postural control, increased strength, ability to compensate for deficits, functional use of  right lower extremity and left lower extremity, improved attention, and improved awareness.  Patient to discharge at an ambulatory level Supervision.   Patient's care partner is independent to provide the necessary physical and cognitive assistance at discharge.  Reasons goals not met: Pt required supervision for home ambulation due to decreased safety awareness - education provided to pt and her son regarding this - recommended she use wheelchair for household mobility unless supervision can be provided from family.  Recommendation:  Patient will benefit from ongoing skilled PT services in home health setting to continue to advance safe functional mobility, address ongoing impairments in BLE weakness, dynamic standing balance deficits, deconditioning and impaired activity tolerance, home safety training, and minimize fall risk.  Equipment: No equipment provided. Pt owns RW and w/c from prior hospitalizations.   Reasons for discharge: treatment goals met and discharge from hospital  Patient/family agrees with progress made and goals achieved: Yes  PT Discharge Precautions/Restrictions Precautions Precautions: Fall Precaution Comments: L eye blindness, bilateral foot drop, speaks Arabic Restrictions Weight Bearing Restrictions: No Vital Signs Therapy Vitals Temp: 98.3 F (36.8 C) Pulse Rate: 75 Resp: 18 BP: 137/70 Patient Position (if appropriate): Lying Oxygen Therapy SpO2: 96 % O2 Device: Room Air Pain Pain Assessment Pain Scale: 0-10 Pain Score: 0-No pain Pain Interference Pain Interference Pain Effect on Sleep: 0. Does not apply - I have not had any pain or  hurting in the past 5 days Pain Interference with Therapy Activities: 0. Does not apply - I have not received rehabilitationtherapy in the past 5 days Pain Interference with Day-to-Day Activities: 1. Rarely or not at all Vision/Perception  Vision - History Ability to See in Adequate Light: 0 Adequate Vision - Assessment Eye Alignment: Impaired (comment) (L eye blindness) Ocular Range of Motion: Restricted on the left Tracking/Visual Pursuits: Decreased smoothness of eye movement to LEFT superior field;Decreased smoothness of eye movement to LEFT inferior field;Other (comment) (R eye WFL. L eye impaired) Additional Comments: Blind in L eye (pre-morbid) Perception Perception: Within Functional Limits Praxis Praxis: Intact  Cognition Overall Cognitive Status: History of cognitive impairments - at baseline Arousal/Alertness: Awake/alert Orientation Level: Oriented X4 Attention: Focused;Sustained;Selective Focused Attention: Appears intact Sustained Attention: Appears intact Selective Attention: Appears intact Memory: Impaired Memory Impairment: Decreased recall of new information;Decreased short term memory Decreased Short Term Memory: Verbal basic;Functional basic Awareness: Appears intact Problem Solving: Impaired Problem Solving Impairment: Functional basic Safety/Judgment: Impaired Comments: h/o frequent falls at home Sensation Sensation Light Touch: Impaired by gross assessment Hot/Cold: Appears Intact Proprioception: Appears Intact Stereognosis: Not tested Additional Comments: 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 Coordination Gross Motor Movements are Fluid and Coordinated: No Heel Shin Test: unable due to hip weakness bil Motor  Motor Motor: Within Functional Limits Motor - Discharge Observations: Generalized weakness and deconditioning - improved since date of evaluation  Mobility Bed Mobility Bed Mobility: Rolling Right;Rolling Left;Sit to Supine;Supine to  Sit Rolling Right: Independent Rolling Left: Independent Supine to Sit: Independent Sit to Supine: Independent Transfers Transfers: Sit to Stand;Stand to [employer] Transfers Sit to Stand: Independent with assistive device Stand to Sit: Independent with assistive device Stand Pivot Transfers: Independent with assistive device Transfer (Assistive device): Rolling walker Locomotion  Gait Ambulation: Yes  Gait Assistance: Supervision/Verbal cueing Gait Distance (Feet): 150 Feet Assistive device: Rolling walker Gait Assistance Details: Verbal cues for technique;Verbal cues for precautions/safety;Verbal cues for safe use of DME/AE Gait Gait: Yes Gait Pattern: Impaired Gait Pattern: Step-through pattern;Decreased hip/knee flexion - left;Decreased dorsiflexion - right;Decreased dorsiflexion - left;Decreased hip/knee flexion - right;Trunk flexed;Wide base of support (R foot ER) Gait velocity: decreased Stairs / Additional Locomotion Stairs: Yes Stairs Assistance: Moderate Assistance - Patient 50 - 74% Stair Management Technique: Two rails Number of Stairs: 1 Height of Stairs: 3 (inches) Ramp: Minimal Assistance - Patient >75% Curb: Minimal Assistance - Patient >75% (4inch curb with RW. minA needed for gently lifting LLE onto step with L foot leading ascent) Wheelchair Mobility Wheelchair Mobility: Yes Wheelchair Assistance: Chartered loss adjuster: Both upper extremities Wheelchair Parts Management: Needs assistance Distance: 133f  Trunk/Postural Assessment  Cervical Assessment Cervical Assessment: Exceptions to [facility](mild forward head) Thoracic Assessment Thoracic Assessment: Exceptions to [facility](rounded shoulders) Lumbar Assessment Lumbar Assessment: Exceptions to [facility](posterior pelvic tilt) Postural Control Postural Control: Deficits on evaluation (+ Gower's sign for standing)  Balance Balance Balance Assessed: Yes Static Sitting Balance Static  Sitting - Balance Support: No upper extremity supported;Feet supported Static Sitting - Level of Assistance: 7: Independent Dynamic Sitting Balance Dynamic Sitting - Balance Support: No upper extremity supported;Feet supported Dynamic Sitting - Level of Assistance: 6: Modified independent (Device/Increase time) Static Standing Balance Static Standing - Balance Support: Bilateral upper extremity supported Static Standing - Level of Assistance: 6: Modified independent (Device/Increase time) Dynamic Standing Balance Dynamic Standing - Balance Support: Bilateral upper extremity supported;During functional activity Dynamic Standing - Level of Assistance: 4: Min assist Extremity Assessment      RLE Assessment RLE Assessment: Exceptions to [facility] RoadRLE Strength RLE Overall Strength: Deficits Right Hip Flexion: 2+/5 Right Knee Flexion: 3/5 Right Knee Extension: 4/5 Right Ankle Dorsiflexion: 0/5 LLE Assessment LLE Assessment: Exceptions to [facility] RichardsonLLE Strength LLE Overall Strength: Deficits Left Hip Flexion: 2/5 Left Knee Flexion: 3/5 Left Knee Extension: 4/5 Left Ankle Dorsiflexion: 1/5   Dontrey Snellgrove P Leonetta Mcgivern PT, DPT 04/03/2021, 7:48 AM

## 2021-04-03 NOTE — Progress Notes (Signed)
Inpatient Rehabilitation Care Coordinator Discharge Note   Patient Details  Name: Jennifer [name] MRN: [id number] Date of Birth: 1970-11-02   Discharge location: [place] WHO WILL PROVIDE ALMOST 24/7 SUPERVISION  Length of Stay: 18 DAYS  Discharge activity level: SUPERVISION-MOD/I LEVEL  Home/community participation: ACTIVE  Patient response [id number] Literacy - How often do you need to have someone help you when you read instructions, pamphlets, or other written material from your doctor or pharmacy?: Rarely  Patient response [id number] Isolation - How often do you feel lonely or isolated from those around you?: Never  Services provided included: MD, RD, PT, OT, SLP, RN, CM, TR, Pharmacy, SW  Financial Services:  Financial Services Utilized: Medicaid    Choices offered to/list presented to: PT AND DAUGHTER  Follow-up services arranged:  Other (Comment) (COULD NOT FIND A HOME HEALTH AGENCY TO ACCEPT HER MEDICAID, HOME EXERCISE PROGRAM GIVEN TO THEM. HAS ALL DME FROM PREVIOUS ADMITS)           Patient response to transportation need: Is the patient able to respond to transportation needs?: Yes In the past 12 months, has lack of transportation kept you from medical appointments or from getting medications?: No In the past 12 months, has lack of transportation kept you from meetings, work, or from getting things needed for daily living?: No    Comments (or additional information): PT DID [place]. FAMILY TO PROVIDE ALMOST 24/7 BUT 1-2 HOURS WILL BE ALONE AT HOME WHILE HUSBAND WORKS AND DAUGHTER IN SCHOOL  Patient/Family verbalized understanding of follow-up arrangements:  Yes  Individual responsible for coordination of the follow-up plan: BASHAYER-DAUGHGER [phone]  Confirmed correct DME delivered: Elease Hashimoto 04/03/2021    Sanye Ledesma, Gardiner Rhyme

## 2021-04-03 NOTE — Progress Notes (Signed)
PROGRESS NOTE   Subjective/Complaints: Jennifer [name] has no complaints or concerns this morning Her husband has some questions regarding her discharge medications, appreciate Linna Hoff discussing these with him around 1pm with interpreter presents  ROS:  Pt denies SOB, abd pain, CP, N/V/C/D, and vision changes   Objective:   No results found. Recent Labs    04/02/21 0645  WBC 4.3  HGB 8.7*  HCT 28.3*  PLT 166    Recent Labs    04/02/21 0645  NA 140  K 4.5  CL 106  CO2 27  GLUCOSE 97  BUN 30*  CREATININE 0.94  CALCIUM 9.2      Intake/Output Summary (Last 24 hours) at 04/03/2021 1238 Last data filed at 04/03/2021 0900 Gross per 24 hour  Intake 420 ml  Output --  Net 420 ml        Physical Exam: Vital Signs Blood pressure 137/70, pulse 75, temperature 98.3 F (36.8 C), resp. rate 18, height 5\' 6"  (1.676 m), weight 76.1 kg, last menstrual period 08/21/2016, SpO2 96 %. Gen: no distress, normal appearing HEENT: oral mucosa pink and moist, NCAT Cardio: Reg rate Chest: normal effort, normal rate of breathing Abd: soft, non-distended Ext: no edema Psych: pleasant, normal affect Skin: Warm and dry.  Intact. Psych: Normal mood.  Normal behavior. Musc: Bilateral ankle extremities.  No tenderness in extremities. Neuro: Alert Bilateral lower extremity foot drop, unchanged  Assessment/Plan: 1. Functional deficits which require 3+ hours per day of interdisciplinary therapy in a comprehensive inpatient rehab setting. Physiatrist is providing close team supervision and 24 hour management of active medical problems listed below. Physiatrist and rehab team continue to assess barriers to discharge/monitor patient progress toward functional and medical goals  Care Tool:  Bathing              Bathing assist Assist Level: Minimal Assistance - Patient > 75%     Upper Body Dressing/Undressing Upper body  dressing   What is the patient wearing?: Pull over shirt    Upper body assist Assist Level: Supervision/Verbal cueing    Lower Body Dressing/Undressing Lower body dressing      What is the patient wearing?: Pants     Lower body assist Assist for lower body dressing: Minimal Assistance - Patient > 75%     Toileting Toileting    Toileting assist Assist for toileting: Minimal Assistance - Patient > 75%     Transfers Chair/bed transfer  Transfers assist     Chair/bed transfer assist level: Supervision/Verbal cueing     Locomotion Ambulation   Ambulation assist      Assist level: Supervision/Verbal cueing Assistive device: Walker-rolling Max distance: 142ft   Walk 10 feet activity   Assist     Assist level: Supervision/Verbal cueing Assistive device: Walker-rolling   Walk 50 feet activity   Assist Walk 50 feet with 2 turns activity did not occur: Safety/medical concerns (fatigued)  Assist level: Supervision/Verbal cueing Assistive device: Walker-rolling    Walk 150 feet activity   Assist Walk 150 feet activity did not occur: Safety/medical concerns (fatigue)  Assist level: Supervision/Verbal cueing Assistive device: Walker-rolling    Walk 10 feet on uneven surface  activity   Assist Walk 10 feet on uneven surfaces activity did not occur: Safety/medical concerns   Assist level: Contact Guard/Touching assist Assistive device: Walker-rolling   Wheelchair     Assist Is the patient using a wheelchair?: Yes Type of Wheelchair: Manual    Wheelchair assist level: Supervision/Verbal cueing Max wheelchair distance: 172ft    Wheelchair 50 feet with 2 turns activity    Assist        Assist Level: Supervision/Verbal cueing   Wheelchair 150 feet activity     Assist      Assist Level: Supervision/Verbal cueing   Blood pressure 137/70, pulse 75, temperature 98.3 F (36.8 C), resp. rate 18, height 5\' 6"  (1.676 m), weight  76.1 kg, last menstrual period 08/21/2016, SpO2 96 %.  Medical Problem List and Plan: 1.  Debility functional deficits secondary to proctitis/colitis/GI bleed/multi medical  Continue CIR 2.  Impaired mobility: continue SCDs at night as per daughter's request.  -DVT/anticoagulation:  Pharmaceutical: Other (comment) Eliquis             -antiplatelet therapy: N/A 3. Left shoulder pain and limited range of motion: MRI shows arthritis- ortho consulted for shoulder injection but decided to continue exercise at this time as this appears to be helping. Continue Neurontin 300 mg nightly  Encourage elevation of feet with ice to ankles at present-we will consider medications if necessary 4. Mood: Provide emotional support             -antipsychotic agents: N/A 5. Neuropsych: This patient is capable of making decisions on her own behalf. 6. Skin/Wound Care: Routine skin checks 7. Fluids/Electrolytes/Nutrition: Routine in and outs 8.  Proctitis/colitis/GI bleed.  Follow-up GI Dr. Paulita Fujita.  Completed course of Omnicef and Flagyl.  Hemoccult positive. Per GI conservative management recommended for now.  Hemoglobin 8.3 on 12/2, 8.4 on 12/5, 8.7 on 12/12, repeat tomorrow prior to d/c 9.  Prior CVA/abnormal MRI.  Recent admission 02/25/2021 to 03/05/2021.  Plan repeat MRI 4 weeks per neurology services 10.  Atrial fibrillation.  Cardiac rate controlled. Continue Eliquis. Magnesium level 1.9 on 11/28 11.  Hypertension.  Continue amlodipine to 10mg . Add magnesium gluconate 250mg  HS.  12.  Uncontrolled diabetes mellitus.  Hemoglobin A1c greater than 12.  Check blood sugars before meals and at bedtime.  Diabetic teaching CBG (last 3)  Recent Labs    04/02/21 2059 04/03/21 0535 04/03/21 1152  GLUCAP 230* 104* 126*  Decrease levemir to 14U Metformin XR 100mg  BID - will hold due to freq loose stools-appear to be improving Novolog 0-6 U added TID with meals as needed.  Novolog 3U added with meals if she eats  >50% 12/11- Had 1 episode of 66- hadn't eaten much breakfast- will con't but if drops again, will need to decrease meds 13.  AKI versus CKD.  Placed nursing order to encourage 6-8 glasses of water per day  Creatinine 1.04 on 11/28, 0.97 on 12/5, monitor as needed  12/11- labs in AM 14.  Glaucoma.  Left eye blindness.  Follow Dr. Catalina [facility].  Continue eyedrops. She asked that Bromidine be discontinued due to burning. I have done so, and asked husband to call Dr. Manuella Ghazi to scheduled outpatient ophthalmology appointment for soon after she leaves here. Messaged Neoma Laming to help get husband permanent handicap placard on Monday.  15.  Urinary retention.  Foley tube removed.  Follow-up outpatient urology as needed 16.  Medical noncompliance.  Counseling 17. Overweight BMI 27.08: provide dietary education.  18. Bilateral  lower extremity edema: recommended low sodium diet 19.  Diarrhea:?  Resolved 20. Disposition: HFU scheduled.   LOS: 17 days A FACE TO FACE EVALUATION WAS PERFORMED  Izora Ribas 04/03/2021, 12:38 PM

## 2021-04-03 NOTE — Progress Notes (Signed)
[name], female   DOB: 11-19-1970, 50 y.o.   MRN: [id number] Have arranged interpreter for tomorrow at 10;00 am per request of pt and family and they prefer Al[employer]ning. Have emailed Interpreting services and Language resources for this request.

## 2021-04-03 NOTE — Progress Notes (Signed)
Physical Therapy Session Note  Patient Details  Name: Jennifer [name] MRN: [id number] Date of Birth: 21-Jul-1970  Today's Date: 04/03/2021 PT Individual Time: 1006-1100 + 1300-1413 PT Individual Time Calculation (min): 54 min  + 73 min  Short Term Goals: Week 3:  PT Short Term Goal 1 (Week 3): STG = LTG due to ELOS  Skilled Therapeutic Interventions/Progress Updates:     1st session: Pt sitting EOB to start session with direct handoff of care from OT who was finishing up family education with husband. Interpreter also present to assist with communication. Lengthy conversation regarding pt's current mobility status, fall prevention strategies, using w/c at home while unsupervised and ambulating with RW when supervision is available, wearing AFOs at all times when ambulating and having shoes on with braces, etc. All questions/concerns addressed and family appreciative of care. Husband with questions regarding medications/prescriptions - messaged M.D via secure chat to make aware.   Sit<>Stand to RW mod I from EOB. Instructed in long distance gait training to educate on energy conservation and fall prevention as gait unsteadiness increases with fatigue. Ambulated ~172ft + ~267ft + ~163ft with supervision and RW - primary gait deficits include R foot ER, heavy reliance of UE support through RW, shldr elevation, and downward gaze. No knee buckling or LOB observed but gait distances limited primarily by BLE fatigue.  Assisted onto Nustep where she required assist for foot placement into paddles due to hip flexor weakness. Completed 8 minutes using BUE/BLE at workload of 5, maintaining appropriate cadence of ~45 steps/minute.   Assisted back to her w/c via stand<>pivot transfer with RW and supervision. Returned to her room where she remained seated in w/c with safety belt alarm on and all needs within reach.   2nd session: Pt in w/c to start session and agreeable to therapy. Interpreter present to  assist with communication needs and family friend at bedside. Pt requesting to toilet prior to going to rehab gym. Sit<>stand to RW mod I and ambulates with supervision to bathroom. Family Friend provides assist with toileting due to request for female. Pt continent of bladder and charted in flowsheets. Pt able to ambulate back to w/c with supervision and RW. Transported downstairs to 65M rehab gym for time and energy conservation.  Instructed in repeated step ups to 3inch stair with 2 hand rails and minA for lifting LLE to ascend - completed 2x5 reps with seated rest breaks b/w bouts.  Pt requesting to attempt to navigate flight of 3inch steps - required minA for all but last step which required modA due to lack of UE support from rails and poor safety awareness while attempting to transfer back to w/c - language barrier limiting at times.   Instructed in BLE strengthening with self propulsion in w/c using BLE only in both forward and backward directions ~43ft each way - supervision and verbal cues needed for technique to engage hamstrings and hip extensors.  Pt completed functional gait training on level surfaces with RW and supervision - ambulating 125ft. VC for keeping body within walker frame and reducing downward gaze.  Completed seated there-ex with LAQ and isometric knee extensions in sitting with 2lb ankle weight, 2x10 bilaterally.  Returned upstairs to her room where she requested to return to EOB. Stand<>pivot transfer completed mod I with RW. Ended session seated EOB with all needs in reach and bed alarm on.  Therapy Documentation Precautions:  Precautions Precautions: Fall (bil foot drop; need interpreter) Restrictions Weight Bearing Restrictions: Yes General:  Therapy/Group: Individual Therapy  Alger Simons 04/03/2021, 7:31 AM

## 2021-04-04 ENCOUNTER — Other Ambulatory Visit: Payer: Self-pay | Admitting: Internal Medicine

## 2021-04-04 LAB — CBC WITH DIFFERENTIAL/PLATELET
Abs Immature Granulocytes: 0 10*3/uL (ref 0.00–0.07)
Basophils Absolute: 0 10*3/uL (ref 0.0–0.1)
Basophils Relative: 1 %
Eosinophils Absolute: 0.3 10*3/uL (ref 0.0–0.5)
Eosinophils Relative: 6 %
HCT: 25.6 % — ABNORMAL LOW (ref 36.0–46.0)
Hemoglobin: 8 g/dL — ABNORMAL LOW (ref 12.0–15.0)
Immature Granulocytes: 0 %
Lymphocytes Relative: 45 %
Lymphs Abs: 2 10*3/uL (ref 0.7–4.0)
MCH: 27.8 pg (ref 26.0–34.0)
MCHC: 31.3 g/dL (ref 30.0–36.0)
MCV: 88.9 fL (ref 80.0–100.0)
Monocytes Absolute: 0.4 10*3/uL (ref 0.1–1.0)
Monocytes Relative: 8 %
Neutro Abs: 1.8 10*3/uL (ref 1.7–7.7)
Neutrophils Relative %: 40 %
Platelets: 147 10*3/uL — ABNORMAL LOW (ref 150–400)
RBC: 2.88 MIL/uL — ABNORMAL LOW (ref 3.87–5.11)
RDW: 13.3 % (ref 11.5–15.5)
WBC: 4.5 10*3/uL (ref 4.0–10.5)
nRBC: 0 % (ref 0.0–0.2)

## 2021-04-04 LAB — GLUCOSE, CAPILLARY: Glucose-Capillary: 78 mg/dL (ref 70–99)

## 2021-04-04 NOTE — Progress Notes (Signed)
PROGRESS NOTE   Subjective/Complaints: No new complaints this morning Sleepy Stable for d/c today. Hgb remains above 8 today  ROS:  Pt denies SOB, abd pain, CP, N/V/C/D, and vision changes   Objective:   No results found. Recent Labs    04/02/21 0645 04/04/21 0543  WBC 4.3 4.5  HGB 8.7* 8.0*  HCT 28.3* 25.6*  PLT 166 147*    Recent Labs    04/02/21 0645  NA 140  K 4.5  CL 106  CO2 27  GLUCOSE 97  BUN 30*  CREATININE 0.94  CALCIUM 9.2      Intake/Output Summary (Last 24 hours) at 04/04/2021 0929 Last data filed at 04/04/2021 0905 Gross per 24 hour  Intake 805 ml  Output --  Net 805 ml        Physical Exam: Vital Signs Blood pressure 137/68, pulse 76, temperature (!) 97.5 F (36.4 C), temperature source Oral, resp. rate 14, height 5\' 6"  (1.676 m), weight 76.1 kg, last menstrual period 08/21/2016, SpO2 98 %. Gen: no distress, normal appearing HEENT: oral mucosa pink and moist, NCAT Cardio: Reg rate Chest: normal effort, normal rate of breathing Abd: soft, non-distended Ext: no edema Psych: Normal mood.  Normal behavior. Musc: Bilateral ankle extremities.  No tenderness in extremities. Neuro: Alert Bilateral lower extremity foot drop, unchanged  Assessment/Plan: 1. Functional deficits which require 3+ hours per day of interdisciplinary therapy in a comprehensive inpatient rehab setting. Physiatrist is providing close team supervision and 24 hour management of active medical problems listed below. Physiatrist and rehab team continue to assess barriers to discharge/monitor patient progress toward functional and medical goals  Care Tool:  Bathing              Bathing assist Assist Level: Set up assist     Upper Body Dressing/Undressing Upper body dressing   What is the patient wearing?: Pull over shirt    Upper body assist Assist Level: Independent with assistive device    Lower  Body Dressing/Undressing Lower body dressing      What is the patient wearing?: Pants     Lower body assist Assist for lower body dressing: Independent with assitive device     Toileting Toileting    Toileting assist Assist for toileting: Independent with assistive device     Transfers Chair/bed transfer  Transfers assist     Chair/bed transfer assist level: Independent with assistive device Chair/bed transfer assistive device: Museum/gallery exhibitions officer assist      Assist level: Supervision/Verbal cueing Assistive device: Walker-rolling Max distance: 151ft   Walk 10 feet activity   Assist     Assist level: Supervision/Verbal cueing Assistive device: Walker-rolling   Walk 50 feet activity   Assist Walk 50 feet with 2 turns activity did not occur: Safety/medical concerns (fatigued)  Assist level: Supervision/Verbal cueing Assistive device: Walker-rolling    Walk 150 feet activity   Assist Walk 150 feet activity did not occur: Safety/medical concerns (fatigue)  Assist level: Supervision/Verbal cueing Assistive device: Walker-rolling    Walk 10 feet on uneven surface  activity   Assist Walk 10 feet on uneven surfaces activity did not occur:  Safety/medical concerns   Assist level: Contact Guard/Touching assist Assistive device: Walker-rolling   Wheelchair     Assist Is the patient using a wheelchair?: Yes Type of Wheelchair: Manual    Wheelchair assist level: Supervision/Verbal cueing Max wheelchair distance: 163ft    Wheelchair 50 feet with 2 turns activity    Assist        Assist Level: Supervision/Verbal cueing   Wheelchair 150 feet activity     Assist      Assist Level: Supervision/Verbal cueing   Blood pressure 137/68, pulse 76, temperature (!) 97.5 F (36.4 C), temperature source Oral, resp. rate 14, height 5\' 6"  (1.676 m), weight 76.1 kg, last menstrual period 08/21/2016, SpO2 98  %.  Medical Problem List and Plan: 1.  Debility functional deficits secondary to proctitis/colitis/GI bleed/multi medical  D/c home 2.  Impaired mobility: continue SCDs at night as per daughter's request.  -DVT/anticoagulation:  Pharmaceutical: Other (comment) Eliquis             -antiplatelet therapy: N/A 3. Left shoulder pain and limited range of motion: MRI shows arthritis- ortho consulted for shoulder injection but decided to continue exercise at this time as this appears to be helping. Continue gabapentin HS  Encourage elevation of feet with ice to ankles at present-we will consider medications if necessary 4. Mood: Provide emotional support             -antipsychotic agents: N/A 5. Neuropsych: This patient is capable of making decisions on her own behalf. 6. Skin/Wound Care: Routine skin checks 7. Fluids/Electrolytes/Nutrition: Routine in and outs 8.  Proctitis/colitis/GI bleed.  Follow-up GI Dr. Paulita Fujita.  Completed course of Omnicef and Flagyl.  Hemoccult positive. Per GI conservative management recommended for now.  Hemoglobin 8.3 on 12/2, 8.4 on 12/5, 8.7 on 12/12, repeat tomorrow prior to d/c 9.  Prior CVA/abnormal MRI.  Recent admission 02/25/2021 to 03/05/2021.  Plan repeat MRI 4 weeks per neurology services 10.  Atrial fibrillation.  Cardiac rate controlled. Continue Eliquis. Magnesium level 1.9 on 11/28 11.  Hypertension.  Continue amlodipine to 10mg . Add magnesium gluconate 250mg  HS.  12.  Uncontrolled diabetes mellitus.  Hemoglobin A1c greater than 12.  Check blood sugars before meals and at bedtime.  Diabetic teaching CBG (last 3)  Recent Labs    04/03/21 1641 04/03/21 2115 04/04/21 0550  GLUCAP 251* 232* 78  Decrease levemir to 14U Metformin XR 100mg  BID - will hold due to freq loose stools-appear to be improving Novolog 0-6 U added TID with meals as needed.  Novolog 3U added with meals if she eats >50% 12/11- Had 1 episode of 66- hadn't eaten much breakfast- will con't  but if drops again, will need to decrease meds 13.  AKI versus CKD.  Placed nursing order to encourage 6-8 glasses of water per day  Creatinine 1.04 on 11/28, 0.97 on 12/5, monitor as needed  12/11- labs in AM 14.  Glaucoma.  Left eye blindness.  Follow Dr. Catalina [facility].  Continue eyedrops. She asked that Bromidine be discontinued due to burning. I have done so, and asked husband to call Dr. Manuella Ghazi to scheduled outpatient ophthalmology appointment for soon after she leaves here. Messaged Neoma Laming to help get husband permanent handicap placard on Monday.  15.  Urinary retention.  Foley tube removed.  Follow-up outpatient urology as needed 16.  Medical noncompliance.  Counseling 17. Overweight BMI 27.08: provide dietary education.  18. Bilateral lower extremity edema: recommended low sodium diet 19.  Diarrhea:?  Resolved  20. Disposition: HFU scheduled.   LOS: 18 days A FACE TO FACE EVALUATION WAS PERFORMED  Clide Deutscher Johnattan Strassman 04/04/2021, 9:29 AM

## 2021-04-06 ENCOUNTER — Telehealth: Payer: Self-pay | Admitting: Internal Medicine

## 2021-04-06 NOTE — Telephone Encounter (Signed)
Pt's daughter states she is having problems getting her mother's medications refilled since her most recent Discharge on 04/04/2021.  Please call back.

## 2021-04-06 NOTE — Telephone Encounter (Signed)
Returned call to patient's daughter. 10 [facility]s [facility] by Angiulli, Lavon Paganini, PA-C. She states [facility] states they're not there. She is advised to call his office at [phone].

## 2021-04-09 ENCOUNTER — Telehealth: Payer: Self-pay | Admitting: Internal Medicine

## 2021-04-09 ENCOUNTER — Other Ambulatory Visit: Payer: Self-pay | Admitting: Obstetrics and Gynecology

## 2021-04-09 NOTE — Patient Outreach (Signed)
Care Coordination  04/09/2021  [name] 04-Aug-1970 [id number]   Medicaid Managed Care   Unsuccessful Outreach Note  04/09/2021 Name: Jennifer [name] MRN: [id number] DOB: [birth year]  Referred by: Jose Persia, MD Reason for referral : High Risk Managed Medicaid (Unsuccessful telephone outreach)   A second unsuccessful telephone outreach was attempted today. The patient was referred to the case management team for assistance with care management and care coordination.   Follow Up Plan: The care management team will reach out to the patient again over the next 7 days.   [name] RN, BSN [place]   [employer] - Managed Medicaid High Risk [phone].

## 2021-04-09 NOTE — Patient Instructions (Signed)
Visit Information  [name]  - as a part of your Medicaid benefit, you are eligible for care management and care coordination services at no cost or copay. I was unable to reach you by phone today but would be happy to help you with your health related needs. Please feel free to call me at [phone].  A member of the Managed Medicaid care management team will reach out to you again over the next 7 days.   [name] RN, BSN [employer]   [employer] - Managed Medicaid High Risk [phone].

## 2021-04-09 NOTE — Telephone Encounter (Signed)
.. °  Medicaid Managed Care   Unsuccessful Outreach Note  04/09/2021 Name: Jennifer [name] MRN: [id number] DOB: 1971-03-18  Referred by: Jose Persia, MD Reason for referral : High Risk Managed Medicaid (I tried to reach the patient's daughter today to get her mom rescheduled for a phone visit with the MM RNCM. It only rang once and said the VM was not set up.)   An unsuccessful telephone outreach was attempted today. The patient was referred to the case management team for assistance with care management and care coordination.   Follow Up Plan: The care management team will reach out to the patient again over the next 7 days.   [place]

## 2021-04-11 ENCOUNTER — Telehealth: Payer: Self-pay

## 2021-04-11 NOTE — Telephone Encounter (Signed)
Cathy from [facility] called to inquire about patient's medications. She stated that the daughter of the patient was trying to figure out why she did not get all medications. Called [facility] and they stated the patient picked up all medications except Magnesium and Calcium because they were OTC and Gabapentin because it was too early to fill. Called daughter and informed her and she stated she did not have the blood pressure medication, but she would go home and double check to see. Informed her that she can call the office to inform us if she needs the medication sent in or if she has it at home.

## 2021-04-14 ENCOUNTER — Other Ambulatory Visit (HOSPITAL_COMMUNITY): Payer: Self-pay | Admitting: Physical Medicine & Rehabilitation

## 2021-04-14 ENCOUNTER — Telehealth: Payer: Self-pay | Admitting: Student

## 2021-04-14 MED ORDER — INSULIN DETEMIR 100 UNIT/ML FLEXPEN: 14.0000 [IU] | PEN_INJECTOR | Freq: Every day | SUBCUTANEOUS | 11 refills | Status: DC

## 2021-04-14 MED ORDER — NOVOLOG FLEXPEN 100 UNIT/ML ~~LOC~~ SOPN: 3.0000 [IU] | PEN_INJECTOR | Freq: Three times a day (TID) | SUBCUTANEOUS | 11 refills | Status: DC

## 2021-04-14 NOTE — Telephone Encounter (Signed)
Spoke with patient and daughter via telephone call after receiving page. Daughter states that patient does not have any insulin (short acting or long acting) and does not have her blood pressure medication either. States that insulin medications were sent to pharmacy but they were told Medicaid will not cover it and they are unable to purchase it. Advised to f/u in clinic for further evaluation and refill assistance. Unfortunately, clinic will not open until Tuesday. Advised to monitor glucose levels at home and to come to ED should she need more immediate assistance.  I will message the [facility] front desk staff to help set up an appointment for patient on Tuesday (12/27).

## 2021-04-17 ENCOUNTER — Telehealth: Payer: Self-pay | Admitting: *Deleted

## 2021-04-17 NOTE — Telephone Encounter (Signed)
Talked to pt's daughter after receiving the message from Dr Allyson Sabal. She stated she was able to get the insulin pt needed after making several calls. She would like to schedule pt an appt for next week -call transferred to front office. Appt scheduled with Dr Darrick Meigs 04/30/21.

## 2021-04-20 ENCOUNTER — Other Ambulatory Visit: Payer: Self-pay

## 2021-04-20 ENCOUNTER — Other Ambulatory Visit: Payer: Self-pay | Admitting: Obstetrics and Gynecology

## 2021-04-20 NOTE — Patient Outreach (Signed)
Care Coordination  04/20/2021  Aisia Correira [birth year] [id number]  RNCM called at scheduled time, patient's daughter, [name], answered phone and asked to return call in 5 minutes.  RNCM did not receive a return call.   [name] RN, BSN [place]   [employer] - Managed Medicaid High Risk [phone].

## 2021-04-23 LAB — ACID FAST CULTURE WITH REFLEXED SENSITIVITIES (MYCOBACTERIA): Acid Fast Culture: NEGATIVE

## 2021-04-25 ENCOUNTER — Telehealth: Payer: Self-pay | Admitting: *Deleted

## 2021-04-25 ENCOUNTER — Other Ambulatory Visit: Payer: Self-pay | Admitting: Obstetrics and Gynecology

## 2021-04-25 ENCOUNTER — Other Ambulatory Visit: Payer: Self-pay

## 2021-04-25 ENCOUNTER — Other Ambulatory Visit (HOSPITAL_COMMUNITY): Payer: Self-pay

## 2021-04-25 NOTE — Patient Outreach (Addendum)
Medicaid Managed Care   Nurse Care Manager Note  04/25/2021 Name:  Jennifer [name] MRN:  [id number] DOB:  [birth year]  Jennifer [name] is an 51 y.o. year old female who is a primary patient of Jennifer Persia, MD.  The [facility] Managed Care Coordination team was consulted for assistance with:    Chronic healthcare management needs.  [name] was given information about Medicaid Managed Care Coordination team services today. Jennifer [name] ([name]) agreed to services and verbal consent obtained.  Engaged with patient/patient's daughter , [name],  by telephone for follow up visit in response to provider referral for case management and/or care coordination services.   Assessments/Interventions:  Review of past medical history, allergies, medications, health status, including review of consultants reports, laboratory and other test data, was performed as part of comprehensive evaluation and provision of chronic care management services.  SDOH (Social Determinants of Health) assessments and interventions performed: SDOH Interventions    Flowsheet Row Most Recent Value  SDOH Interventions   Social Connections Interventions Intervention Not Indicated  Transportation Interventions Intervention Not Indicated       Care Plan  No Known Allergies  Medications Reviewed Today     Reviewed by Jennifer Medicus, RN (Registered Nurse) on 04/25/21 at 5  Med List Status: <None>   Medication Order Taking? Sig Documenting Provider Last Dose Status Informant  acetaminophen (TYLENOL) 325 MG tablet [id number]  Take 2 tablets (650 mg total) by mouth every 6 (six) hours as needed for mild pain (or Fever >/= 101). Jennifer Parsons, PA-C  Active   amLODipine (NORVASC) 10 MG tablet [id number]  Take 1 tablet (10 mg total) by mouth daily. Jennifer [name], Jennifer Paganini, PA-C  Active   apixaban (ELIQUIS) 5 MG TABS tablet [id number]  Take 1 tablet (5 mg total) by mouth 2 (two) times daily. AngiulliLavon Paganini, PA-C  Active   brimonidine (ALPHAGAN) 0.2 % ophthalmic solution [id number]  Place 1 drop into the left eye 3 (three) times daily. Jennifer Parsons, PA-C  Active   calcium citrate-vitamin D (CITRACAL+D) 315-200 MG-UNIT tablet [id number]  Take 2 tablets by mouth 2 (two) times daily. Jennifer Parsons, PA-C  Active   Patient not taking:  Discontinued 09/27/20 0116   Patient not taking:  Discontinued 09/27/20 0116   dorzolamide-timolol (COSOPT) 22.3-6.8 MG/ML ophthalmic solution [id number]  Place 1 drop into the left eye 2 (two) times daily. Jennifer Parsons, PA-C  Active   gabapentin (NEURONTIN) 300 MG capsule [id number]  Take 1 capsule (300 mg total) by mouth at bedtime. Jennifer [name], Jennifer Paganini, PA-C  Active   insulin aspart (NOVOLOG FLEXPEN) 100 UNIT/ML FlexPen [id number]  Inject 3 Units into the skin 3 (three) times daily with meals. Jennifer Staggers, MD  Active   insulin detemir (LEVEMIR) 100 UNIT/ML FlexPen [id number]  Inject 14 Units into the skin at bedtime. Jennifer Staggers, MD  Active   linagliptin (TRADJENTA) 5 MG TABS tablet [id number]  Take 1 tablet (5 mg total) by mouth daily. Jennifer [name], Jennifer Paganini, PA-C  Active   magnesium gluconate (MAGONATE) 500 MG tablet [id number]  Take 0.5 tablets (250 mg total) by mouth at bedtime. Jennifer Parsons, PA-C  Active   Multiple Vitamin (MULTIVITAMIN WITH MINERALS) TABS tablet [id number]  Take 1 tablet by mouth daily. Jennifer Odea, MD  Active   Med List Note Jennifer [name], CPhT 10/02/20 2155): Daughter can help            Patient Active Problem  List   Diagnosis Date Noted   Labile blood glucose    Chronic kidney disease    Uncontrolled type 2 diabetes mellitus with hyperglycemia (HCC)    Acute blood loss anemia    Debility 03/17/2021   Acute lower GI bleeding 03/06/2021   Acute proctitis 03/06/2021   Glaucoma 03/06/2021   Acute urinary retention 03/06/2021   At risk for medication noncompliance 03/06/2021   Brain lesion 02/25/2021    NSTEMI (non-ST elevated myocardial infarction) ([place]) 02/04/2021   Hypertensive urgency 02/03/2021   Injury of right toe, initial encounter 01/05/2021   Physical deconditioning 01/05/2021   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 ([place]) 11/29/2020   Orthostatic hypotension 11/22/2020   Lower extremity weakness 11/22/2020   CKD (chronic kidney disease) stage 3, GFR 30-59 ml/min ([place]) 11/22/2020   Depression 08/22/2020   Weight loss, non-intentional 07/04/2020   Adult failure to thrive 07/04/2020   Early satiety 05/31/2020   Urinary tract infection with hematuria 05/16/2020   AKI (acute kidney injury) ([place]) 03/14/2020   Bilateral lower extremity edema 06/07/2019   Atrial fibrillation ([place]) 09/20/2018   Migraine    (HFpEF) heart failure with preserved ejection fraction ([place]) 01/01/2018   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   Pseudophakia of right eye 09/16/2017   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   Fatigue 04/02/2016   Uveitic glaucoma of left eye, 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   Vitamin D deficiency 10/30/2014   Diabetic neuropathy ([place]) 05/13/2013   Healthcare maintenance 02/26/2011   Hyperlipidemia 05/05/2006   Type II diabetes mellitus ([place]) 04/22/1998    Conditions to be addressed/monitored per PCP order:   chronic healthcare management needs, DM, HTN, migraines, depression, HLD, vision loss left eye, deconditioning  Care Plan : General Plan of Care (Adult)  Updates made by Jennifer Medicus, RN since 04/25/2021 12:00 AM     Problem: Health Promotion or Disease Self-Management (General Plan of Care)   Priority: High  Onset Date: 08/29/2020     Long-Range Goal: Self-Management Plan Developed   Start Date: 08/29/2020  Expected End Date: 06/23/2021  Recent Progress: Not on track  Priority: High  Note:   Current Barriers:  Ineffective Self Health Maintenance Patient with diabetes, hypertension, deconditioning, depression Currently UNABLE TO independently self manage  needs related to chronic health conditions.  Knowledge Deficits related to short term plan for care coordination needs and long term plans for chronic disease management needs 04/25/21:  Patient hospitalized 03/17/21-04/04/21-in inpatient rehab.  Patient;s daughter states she has not received phone call from [facility] outpatient PT for outpatient PT.  Has PCP appt 04/27/21. Nurse Case Manager Clinical Goal(s):  patient will work with care management team to address care coordination and chronic disease management needs related to Disease Management Educational Needs Care Coordination Medication Management and Education Medication Reconciliation Medication Assistance  Psychosocial Support Mental Health Counseling   Interventions:  Evaluation of current treatment plan and patient's adherence to plan as established by provider. Reviewed medications with patient/patient's daughter. Collaborated with Pharmacy Pharmacy referral for medication review. Collaborated with Social Work regarding depression.  Referral for financial resources-needs rent assistance. Discussed plans with patient/patient's daughter  for ongoing care management follow up and provided patient / patient;s daughter with direct contact information for care management team Reviewed scheduled/upcoming provider appointments.  Social Work referral for depression. Patient's daughter given [facility] outpatient PT phone number to call for an appointment. Self Care Activities:  Patient will self administer medications as prescribed Patient will attend  all scheduled provider appointments Patient / Patient's daughter will call pharmacy for medication refills Patient will attend church or other social activities Patient will continue to perform ADL's independently Patient/ Patient's daughter  will call provider office for new concerns or questions Patient will work Social Work to address care coordination needs and will continue to work  with the clinical team to address health care and disease management related needs.   Patient Goals: In the next 30 days, patient will attend all provider appointments. In the next 30 days, patient will take medications as directed. In the next 30 days, patient will speak with Social Work and therapist.  Follow Up Plan: [name] has been provided with contact information for the care management team and has been advised to call with any health related questions or concerns.  The care management team will reach out to the patient/patient's daughter.again over the next 30 days.    Evidence-based guidance:   Review biopsychosocial determinants of health screens.  Review need for preventive screening based on age, sex, family history and health history.  Determine level of modifiable health risk.  Discuss identified risks.  Identify areas where behavior change may lead to improved health.  Promote healthy lifestyle.  Evoke change talk using open-ended questions, pros and cons, as well as looking forward.  Identify and manage conditions or preconditions to reduce health risk.  Implement additional goals and interventions based on identified risk factors.     Follow Up:  Patient / Patient's daughter agrees to Care Plan and Follow-up.  Plan: The Managed Medicaid care management team will reach out to the patient /patient's daughter again over the next 30 days. and The  [employer] ([name]) has been provided with contact information for the Managed Medicaid care management team and has been advised to call with any health related questions or concerns.  Date/time of next scheduled RN care management/care coordination outreach:  05/24/21 at 1230.

## 2021-04-25 NOTE — Telephone Encounter (Signed)
Patient's daughter called in asking for a letter stating she is patient's primary caregiver. Daughter lost her financial aid for college 2/2 to how much time she took off to care for patient. She accompanies her to all appts, stays with her during hospitalizations, administers all her meds. She is requesting letter for an Appeal.

## 2021-04-25 NOTE — Patient Instructions (Signed)
Visit Information  [name] , patient's [name], was given information about Medicaid Managed Care team care coordination services as a part of their [place] Medicaid benefit. Jennifer [name]/ patient's daughter, [name],  verbally consented to engagement with the [facility] Managed Care team.   If you are experiencing a medical emergency, please call 911 or report to your local emergency department or urgent care.   If you have a non-emergency medical problem during routine business hours, please contact your provider's office and ask to speak with a nurse.   For questions related to your [facility], please call: [phone] or visit the homepage here: [email or web address]  If you would like to schedule transportation through your [facility], please call the following number at least 2 days in advance of your appointment: [phone].   Call the [place] at [phone], at any time, 24 hours a day, 7 days a week. If you are in danger or need immediate medical attention call 911.  If you would like help to quit smoking, call 1-800-QUIT-NOW (640) 536-1825) OR Espaol: 1-855-Djelo-Ya (0-347-425-9563) o para ms informacin haga clic aqu or Text READY to 200-400 to register via text  [name] / patient's daughter, [name], - following are the goals we discussed in your visit today:   Goals Addressed             This Visit's Progress    Protect My Health       Timeframe:  Long-Range Goal Priority:  High Start Date:       08/29/20                      Expected End Date:  ongoing           Follow Up Date:  05/26/21   - schedule appointment for flu shot - schedule appointment for vaccines needed due to my age or health - schedule recommended health tests (blood work, mammogram, colonoscopy, pap test) - schedule and keep appointment for annual  check-up   04/25/21:  Patient has appt with PCP 04/27/21.  Patient's daughter to call and schedule outpatient PT appt   Why is this important?   Screening tests can find diseases early when they are easier to treat.  Your doctor or nurse will talk with you about which tests are important for you.  Getting shots for common diseases like the flu and shingles will help prevent them.       The patient / patient's daughter verbalized understanding of instructions provided today and agreed to receive a mailed copy of patient instruction and/or educational materials.  The Managed Medicaid care management team will reach out to the patient / patient's daughter, [name], again over the next 30 days.  The  [employer] ([name]) has been provided with contact information for the Managed Medicaid care management team and has been advised to call with any health related questions or concerns.   [name] RN, BSN [place]   [facility] [employer] - Managed Medicaid High Risk [phone].   Following is a copy of your plan of care:  Care Plan : General Plan of Care (Adult)  Updates made by [name], RN since 04/25/2021 12:00 AM     Problem: Health Promotion or Disease Self-Management (General Plan of Care)   Priority: High  Onset Date: 08/29/2020     Long-Range Goal: Self-Management Plan Developed  Start Date: 08/29/2020  Expected End Date: 06/23/2021  Recent Progress: Not on track  Priority: High  Note:   Current Barriers:  Ineffective Self Health Maintenance Patient with diabetes, hypertension, deconditioning, depression Currently UNABLE TO independently self manage needs related to chronic health conditions.  Knowledge Deficits related to short term plan for care coordination needs and long term plans for chronic disease management needs 04/25/21:  Patient hospitalized 03/17/21-04/04/21-in inpatient rehab.  Patient;s daughter states she has not  received phone call from [facility] outpatient PT for outpatient PT.  Has PCP appt 04/27/21. Nurse Case Manager Clinical Goal(s):  patient will work with care management team to address care coordination and chronic disease management needs related to Disease Management Educational Needs Care Coordination Medication Management and Education Medication Reconciliation Medication Assistance  Psychosocial Support Mental Health Counseling   Interventions:  Evaluation of current treatment plan and patient's adherence to plan as established by provider. Reviewed medications with patient/patient's daughter. Collaborated with Pharmacy Pharmacy referral for medication review. Collaborated with Social Work regarding depression.  Referral for financial resources-needs rent assistance. Discussed plans with patient for ongoing care management follow up and provided patient with direct contact information for care management team Reviewed scheduled/upcoming provider appointments.  Social Work referral for depression. Patient's daughter given [facility] outpatient PT phone number to call for an appointment. Self Care Activities:  Patient will self administer medications as prescribed Patient will attend all scheduled provider appointments Patient will call pharmacy for medication refills Patient will attend church or other social activities Patient will continue to perform ADL's independently Patient will call provider office for new concerns or questions Patient will work Social Work to address care coordination needs and will continue to work with the clinical team to address health care and disease management related needs.   Patient Goals: In the next 30 days, patient will attend all provider appointments. In the next 30 days, patient will take medications as directed. In the next 30 days, patient will speak with Social Work and therapist.  Follow Up Plan: [name] has been  provided with contact information for the care management team and has been advised to call with any health related questions or concerns.  The care management team will reach out to the patient/patient's daughter.again over the next 30 days.    Evidence-based guidance:   Review biopsychosocial determinants of health screens.  Review need for preventive screening based on age, sex, family history and health history.  Determine level of modifiable health risk.  Discuss identified risks.  Identify areas where behavior change may lead to improved health.  Promote healthy lifestyle.  Evoke change talk using open-ended questions, pros and cons, as well as looking forward.  Identify and manage conditions or preconditions to reduce health risk.  Implement additional goals and interventions based on identified risk factors.

## 2021-04-26 NOTE — Telephone Encounter (Signed)
We can discuss further details at the time of her appointment with me on 1/9

## 2021-04-27 ENCOUNTER — Emergency Department (HOSPITAL_COMMUNITY): Payer: Medicaid Other

## 2021-04-27 ENCOUNTER — Encounter: Payer: Medicaid Other | Admitting: Internal Medicine

## 2021-04-27 ENCOUNTER — Emergency Department (HOSPITAL_COMMUNITY)
Admission: EM | Admit: 2021-04-27 | Discharge: 2021-04-27 | Disposition: A | Discharge status: Home / Self Care | Payer: Medicaid Other | Attending: Emergency Medicine | Admitting: Emergency Medicine

## 2021-04-27 DIAGNOSIS — Z794 Long term (current) use of insulin: Secondary | ICD-10-CM | POA: Diagnosis not present

## 2021-04-27 DIAGNOSIS — I10 Essential (primary) hypertension: Secondary | ICD-10-CM | POA: Diagnosis not present

## 2021-04-27 DIAGNOSIS — Z7901 Long term (current) use of anticoagulants: Secondary | ICD-10-CM | POA: Insufficient documentation

## 2021-04-27 DIAGNOSIS — R519 Headache, unspecified: Secondary | ICD-10-CM

## 2021-04-27 DIAGNOSIS — Z79899 Other long term (current) drug therapy: Secondary | ICD-10-CM | POA: Diagnosis not present

## 2021-04-27 DIAGNOSIS — Z20822 Contact with and (suspected) exposure to covid-19: Secondary | ICD-10-CM | POA: Insufficient documentation

## 2021-04-27 DIAGNOSIS — E1169 Type 2 diabetes mellitus with other specified complication: Secondary | ICD-10-CM | POA: Insufficient documentation

## 2021-04-27 DIAGNOSIS — R112 Nausea with vomiting, unspecified: Secondary | ICD-10-CM | POA: Insufficient documentation

## 2021-04-27 DIAGNOSIS — I4891 Unspecified atrial fibrillation: Secondary | ICD-10-CM | POA: Diagnosis not present

## 2021-04-27 LAB — RESP PANEL BY RT-PCR (FLU A&B, COVID) ARPGX2
Influenza A by PCR: NEGATIVE
Influenza B by PCR: NEGATIVE
SARS Coronavirus 2 by RT PCR: NEGATIVE

## 2021-04-27 LAB — CBC WITH DIFFERENTIAL/PLATELET
Abs Immature Granulocytes: 0 10*3/uL (ref 0.00–0.07)
Basophils Absolute: 0 10*3/uL (ref 0.0–0.1)
Basophils Relative: 1 %
Eosinophils Absolute: 0.2 10*3/uL (ref 0.0–0.5)
Eosinophils Relative: 4 %
HCT: 36 % (ref 36.0–46.0)
Hemoglobin: 11.4 g/dL — ABNORMAL LOW (ref 12.0–15.0)
Immature Granulocytes: 0 %
Lymphocytes Relative: 48 %
Lymphs Abs: 1.9 10*3/uL (ref 0.7–4.0)
MCH: 27.3 pg (ref 26.0–34.0)
MCHC: 31.7 g/dL (ref 30.0–36.0)
MCV: 86.1 fL (ref 80.0–100.0)
Monocytes Absolute: 0.2 10*3/uL (ref 0.1–1.0)
Monocytes Relative: 5 %
Neutro Abs: 1.7 10*3/uL (ref 1.7–7.7)
Neutrophils Relative %: 42 %
Platelets: 156 10*3/uL (ref 150–400)
RBC: 4.18 MIL/uL (ref 3.87–5.11)
RDW: 12.9 % (ref 11.5–15.5)
WBC: 4 10*3/uL (ref 4.0–10.5)
nRBC: 0 % (ref 0.0–0.2)

## 2021-04-27 LAB — COMPREHENSIVE METABOLIC PANEL
ALT: 17 U/L (ref 0–44)
AST: 21 U/L (ref 15–41)
Albumin: 3.4 g/dL — ABNORMAL LOW (ref 3.5–5.0)
Alkaline Phosphatase: 79 U/L (ref 38–126)
Anion gap: 12 (ref 5–15)
BUN: 16 mg/dL (ref 6–20)
CO2: 24 mmol/L (ref 22–32)
Calcium: 9.5 mg/dL (ref 8.9–10.3)
Chloride: 101 mmol/L (ref 98–111)
Creatinine, Ser: 0.75 mg/dL (ref 0.44–1.00)
GFR, Estimated: >60 mL/min (ref >60)
Glucose, Bld: 101 mg/dL — ABNORMAL HIGH (ref 70–99)
Potassium: 3 mmol/L — ABNORMAL LOW (ref 3.5–5.1)
Sodium: 137 mmol/L (ref 135–145)
Total Bilirubin: 0.5 mg/dL (ref 0.3–1.2)
Total Protein: 7.2 g/dL (ref 6.5–8.1)

## 2021-04-27 MED ORDER — GADOBUTROL 1 MMOL/ML IV SOLN
7.5000 mL | Freq: Once | INTRAVENOUS | Status: AC | PRN
  Administered 2021-04-27: 7.5 mL via INTRAVENOUS

## 2021-04-27 MED ORDER — POTASSIUM CHLORIDE CRYS ER 20 MEQ PO TBCR
40.0000 meq | EXTENDED_RELEASE_TABLET | Freq: Once | ORAL | Status: AC
Start: 2021-04-27 — End: 2021-04-27
  Administered 2021-04-27: 40 meq via ORAL
  Filled 2021-04-27: qty 2

## 2021-04-27 MED ORDER — ACETAMINOPHEN 325 MG PO TABS
650.0000 mg | ORAL_TABLET | Freq: Once | ORAL | Status: AC
  Administered 2021-04-27: 650 mg via ORAL
  Filled 2021-04-27: qty 2

## 2021-04-27 MED ORDER — DIPHENHYDRAMINE HCL 50 MG/ML IJ SOLN
25.0000 mg | Freq: Once | INTRAMUSCULAR | Status: AC
  Administered 2021-04-27: 25 mg via INTRAVENOUS
  Filled 2021-04-27: qty 1

## 2021-04-27 MED ORDER — PROCHLORPERAZINE EDISYLATE 10 MG/2ML IJ SOLN
10.0000 mg | Freq: Once | INTRAMUSCULAR | Status: AC
  Administered 2021-04-27: 10 mg via INTRAVENOUS
  Filled 2021-04-27: qty 2

## 2021-04-27 MED ORDER — ONDANSETRON 4 MG PO TBDP
4.0000 mg | ORAL_TABLET | Freq: Once | ORAL | Status: AC
  Administered 2021-04-27: 4 mg via ORAL
  Filled 2021-04-27: qty 1

## 2021-04-27 NOTE — Discharge Instructions (Signed)
Follow-up with your primary care doctor.  Follow-up with your neurologist in 1 month.

## 2021-04-27 NOTE — ED Provider Notes (Signed)
Patient handed off to me at 4 PM.  Patient here with headache.  Had recent admission concern for multifocal CVA.  She had fairly extensive work-up.  She has had a slowly progressive headache over the last several days.  She has chronic eye issues.  MRI was ordered and per radiology reviewed showed decreased size of enhancing left occipital lesions with decrease edema.  Findings per radiology may reflect resolving infarction or inflammation.  They suspect that given improvement neoplasm is highly unlikely.  Dr. Quinn Axe with neurology has been consulted and they will come to the ED to evaluate the patient.  Family has been updated.  Patient is resting.  9:05 PM patient evaluated by neurology team.  Overall cleared for discharge.  Headache has improved.  They recommend follow-up with primary care doctor and outpatient neurologist.  Discharged in good condition.  This chart was dictated using voice recognition software.  Despite best efforts to proofread,  errors can occur which can change the documentation meaning.    Lennice Sites, DO 04/27/21 2106

## 2021-04-27 NOTE — ED Notes (Signed)
Pt has not been up to BR since arriving in hallway, RN and tech assisted pt to BR in w/c. Pt not steady enough to walk as per baseline but was able to transfer and pivot to and from wheelchair/toilet. Pt provided urine specimen and was continent of one small stool. RN provided peri care, fresh brief, and new linens to bed.  Per Dr Ronnald Nian pt is waiting for neuro to see pt for dispo. RN clarified if okay to eat, Dr Ronnald Nian said yes, RN gave [place] sandwich, crackers and drinks. Pt's daughter at bedside.

## 2021-04-27 NOTE — ED Triage Notes (Addendum)
Patient BIBEMS for headache x3 days. Patient has hx of migraines. Patient seen for same 1 month prior and scan showed a brain lesion. Patient hasnot followed up at this time.   EMS VS: 200/100 80 HR 99% RA

## 2021-04-27 NOTE — ED Notes (Signed)
Pt to MRI

## 2021-04-27 NOTE — ED Notes (Signed)
This RN brought pt into the bathroom and assisted her with changing into disposable, paper scrubs (after pt's family took pt's clothes home earlier in the evening).

## 2021-04-27 NOTE — ED Notes (Signed)
Neurologist at pt bedside, speaking with pt and family.

## 2021-04-27 NOTE — ED Provider Notes (Signed)
[facility] EMERGENCY DEPARTMENT Provider Note   CSN: [id number] Arrival date & time: 04/27/21  0459     History  Chief Complaint  Patient presents with   Headache    Jennifer [name] is a 51 y.o. female.  HPI     51 year old female with history of atrial fibrillation on Eliquis, NSTEMI in October 2022, left eye visual impairment/glaucoma followed by Dr. Manuella Ghazi at [facility], hypertension, hyperlipidemia, diabetes, admission in November for multifocal CVA with concern for infectious inflammatory etiology as well as possible enhancing occipital lesion with LP completed and ruled out meningitis and subarachnoid hemorrhage, admission for proctitis/colitis with GI bleed November 15 to November 26 was reinitiated on her Eliquis and admitted to rehab facility presents with concern for headache for 3 days.  Reports that headache is located frontally, is constant, 8 out of 10 in severity.  Reports that it started slowly 3 days ago and has progressively been getting worse.  Reports associated nausea and vomiting.  Has had about 5 episodes of emesis. Denies any other new neurologic symptoms with that with the exception of tingling of her bilateral hands.  Denies new numbness, weakness, facial droop, difficulty talking, new difficulty walking/dizziness although notes that she is typically using the walker or wheelchair since she has been weak from prior ankle surgery.  She sees ophthalmology for glaucoma, denies any acute change in vision.  Denies cough, chest pain, shortness of breath, fever.  Arabic interpreter used    Home Medications Prior to Admission medications   Medication Sig Start Date End Date Taking? Authorizing Provider  apixaban (ELIQUIS) 5 MG TABS tablet Take 1 tablet (5 mg total) by mouth 2 (two) times daily. 04/03/21  Yes Angiulli, Lavon Paganini, PA-C  brimonidine (ALPHAGAN) 0.2 % ophthalmic solution Place 1 drop into the left eye 3 (three) times daily. 04/03/21  Yes  Angiulli, Lavon Paganini, PA-C  dorzolamide-timolol (COSOPT) 22.3-6.8 MG/ML ophthalmic solution Place 1 drop into the left eye 2 (two) times daily. 04/03/21  Yes Angiulli, Lavon Paganini, PA-C  gabapentin (NEURONTIN) 300 MG capsule Take 1 capsule (300 mg total) by mouth at bedtime. 04/03/21 04/03/22 Yes Angiulli, Lavon Paganini, PA-C  insulin aspart (NOVOLOG FLEXPEN) 100 UNIT/ML FlexPen Inject 3 Units into the skin 3 (three) times daily with meals. 04/14/21  Yes Meredith Staggers, MD  acetaminophen (TYLENOL) 325 MG tablet Take 2 tablets (650 mg total) by mouth every 6 (six) hours as needed for mild pain (or Fever >/= 101). 04/03/21   Angiulli, Lavon Paganini, PA-C  amLODipine (NORVASC) 10 MG tablet Take 1 tablet (10 mg total) by mouth daily. 04/03/21   Angiulli, Lavon Paganini, PA-C  calcium citrate-vitamin D (CITRACAL+D) 315-200 MG-UNIT tablet Take 2 tablets by mouth 2 (two) times daily. Patient not taking: Reported on 04/27/2021 04/03/21 04/03/22  Angiulli, Lavon Paganini, PA-C  insulin detemir (LEVEMIR) 100 UNIT/ML FlexPen Inject 14 Units into the skin at bedtime. 04/14/21   Meredith Staggers, MD  linagliptin (TRADJENTA) 5 MG TABS tablet Take 1 tablet (5 mg total) by mouth daily. 04/03/21 05/03/21  Angiulli, Lavon Paganini, PA-C  magnesium gluconate (MAGONATE) 500 MG tablet Take 0.5 tablets (250 mg total) by mouth at bedtime. Patient not taking: Reported on 04/27/2021 04/03/21   Cathlyn Parsons, PA-C  Multiple Vitamin (MULTIVITAMIN WITH MINERALS) TABS tablet Take 1 tablet by mouth daily. Patient not taking: Reported on 04/27/2021 03/18/21   Debbe Odea, MD  Continuous Blood Gluc Sensor (DEXCOM G6 SENSOR) MISC Use to check blood  sugar at least 6 times a day Patient not taking: Reported on 08/29/2020 08/09/20 09/27/20  Harvie Heck, MD  Continuous Blood Gluc Transmit (DEXCOM G6 TRANSMITTER) MISC Use to check blood sugar at least 6 times a day Patient not taking: Reported on 08/29/2020 08/09/20 09/27/20  Harvie Heck, MD      Allergies    Patient  has no known allergies.    Review of Systems   Review of Systems  Physical Exam Updated Vital Signs BP (!) 154/110 (BP Location: Left Arm)    Pulse (!) 104    Temp 98.3 F (36.8 C) (Oral)    Resp 16    LMP 08/21/2016 (Exact Date)    SpO2 97%  Physical Exam Vitals and nursing note reviewed.  Constitutional:      General: She is not in acute distress.    Appearance: Normal appearance. She is not ill-appearing, toxic-appearing or diaphoretic.  HENT:     Head: Normocephalic.  Eyes:     Conjunctiva/sclera: Conjunctivae normal.  Cardiovascular:     Rate and Rhythm: Normal rate and regular rhythm.     Pulses: Normal pulses.  Pulmonary:     Effort: Pulmonary effort is normal. No respiratory distress.  Musculoskeletal:        General: No deformity or signs of injury.     Cervical back: No rigidity.  Skin:    General: Skin is warm and dry.     Coloration: Skin is not jaundiced or pale.  Neurological:     General: No focal deficit present.     Mental Status: She is alert and oriented to person, place, and time.     Comments: Left eye cloudy cornea/limited assessment Right pupil with question abnormality to shape, minimal reactivity, she reports baseline vision Normal EOM, no facial droop Slight LUE weakness in comparison to right with extension/flexion at elbow Bilalteral LE weakness with hip flexion (pt does not note any changes in strength)    ED Results / Procedures / Treatments   Labs (all labs ordered are listed, but only abnormal results are displayed) Labs Reviewed  COMPREHENSIVE METABOLIC PANEL - Abnormal; Notable for the following components:      Result Value   Potassium 3.0 (*)    Glucose, Bld 101 (*)    Albumin 3.4 (*)    All other components within normal limits  CBC WITH DIFFERENTIAL/PLATELET - Abnormal; Notable for the following components:   Hemoglobin 11.4 (*)    All other components within normal limits  RESP PANEL BY RT-PCR (FLU A&B, COVID) [id number]     EKG None  Radiology CT HEAD WO CONTRAST (5MM)  Result Date: 04/27/2021 CLINICAL DATA:  Headache.  Migraine. EXAM: CT HEAD WITHOUT CONTRAST TECHNIQUE: Contiguous axial images were obtained from the base of the skull through the vertex without intravenous contrast. COMPARISON:  02/27/2021 FINDINGS: Brain: There is no evidence for acute hemorrhage, hydrocephalus, mass lesion, or abnormal extra-axial fluid collection. No definite CT evidence for acute infarction. Asymmetric attenuation in the left temporal lobe and left cerebellum is likely secondary to the patient being positioned on her side. There is hypo attenuation in the left occipital lobe today in this patient was noted to have left occipital lobe lesions on previous MRI of 02/28/2021. Patchy low attenuation in the deep hemispheric and periventricular white matter is nonspecific, but likely reflects chronic microvascular ischemic demyelination. Vascular: No hyperdense vessel or unexpected calcification. Skull: No evidence for fracture. No worrisome lytic or sclerotic lesion. Sinuses/Orbits: The visualized  paranasal sinuses and mastoid air cells are clear. Visualized portions of the globes and intraorbital fat are unremarkable. Other: None. IMPRESSION: 1. Study limited by left-side-down patient positioning. There is ill-defined low-attenuation in the lateral aspect of the inferior left temporal lobe and left cerebellum likely reflecting artifact from the positioning. Hypo attenuation in the left occipital lobe is compatible with previous abnormalities documented by brain MRI on 02/28/2021. However, given the presence of known disease recently in the left occipital region, repeat CT in 12-24 hours likely warranted to ensure that the changes in the left temporal lobe and cerebellum do indeed represent artifact. Based on clinical status, brain MRI could also be used to further evaluate as warranted. 2. Chronic small vessel white matter ischemic disease.  Electronically Signed   By: Misty Stanley M.D.   On: 04/27/2021 07:59    Procedures Procedures    Medications Ordered in ED Medications  ondansetron (ZOFRAN-ODT) disintegrating tablet 4 mg (4 mg Oral Given 04/27/21 0517)  acetaminophen (TYLENOL) tablet 650 mg (650 mg Oral Given 04/27/21 1306)  prochlorperazine (COMPAZINE) injection 10 mg (10 mg Intravenous Given 04/27/21 1311)  diphenhydrAMINE (BENADRYL) injection 25 mg (25 mg Intravenous Given 04/27/21 1311)  potassium chloride SA (KLOR-CON M) CR tablet 40 mEq (40 mEq Oral Given 04/27/21 1306)  gadobutrol (GADAVIST) 1 MMOL/ML injection 7.5 mL (7.5 mLs Intravenous Contrast Given 04/27/21 1546)    ED Course/ Medical Decision Making/ A&P                           Medical Decision Making   51 year old female with history of atrial fibrillation on Eliquis, NSTEMI in October 2022, left eye visual impairment/glaucoma followed by Dr. Manuella Ghazi at [facility], hypertension, hyperlipidemia, diabetes, admission in November for multifocal CVA with concern for infectious inflammatory etiology as well as possible enhancing occipital lesion with LP completed and ruled out meningitis and subarachnoid hemorrhage, admission for proctitis/colitis with GI bleed November 15 to November 26 was reinitiated on her Eliquis and admitted to rehab facility presents with concern for headache for 3 days.  No fever, do not feel presentation is consistent with acute bacterial meningitis.  Not thunderclap headache, does not seem consistent with SAH.  Given history of recent abnormal MRI with plan for outpatient follow up, consider other etiology. (Noted acid fest negative, DSF cx negative, fungal cx negative in November). Plan to obtain MRI interpretation and discuss with Neurology.  Headache cocktail given, pt resting comfortably.         Final Clinical Impression(s) / ED Diagnoses Final diagnoses:  Acute nonintractable headache, unspecified headache type    Rx / DC  Orders ED Discharge Orders     None         Gareth Morgan, MD 04/27/21 1551

## 2021-04-27 NOTE — ED Provider Triage Note (Signed)
Emergency Medicine Provider Triage Evaluation Note  Jennifer [name] , a 51 y.o. female  was evaluated in triage.  Pt complains of headache, nausea and vomiting.  Reports that headache has been present over the last 3 days.  Headache has gotten progressively worse since then.  Headache is worse than previous migraine she has had in the past.  Patient developed nausea and vomiting yesterday.  Has vomited multiple times since then.  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  Daughter at bedside requested to translate.  Review of Systems  Positive: Headache, nausea, vomiting Negative: Numbness, weakness, visual disturbance, abdominal pain  Physical Exam  BP (!) 180/90 (BP Location: Right Arm)    Pulse 92    Temp (!) 97.5 F (36.4 C) (Oral)    Resp 17    LMP 08/21/2016 (Exact Date)    SpO2 100%  Gen:   Awake, no distress   Resp:  Normal effort  MSK:   Moves extremities without difficulty  Other:  No facial asymmetry.  + Strength to bilateral upper and lower extremities.  Medical Decision Making  Medically screening exam initiated at 5:21 AM.  Appropriate orders placed.  [name] was informed that the remainder of the evaluation will be completed by another provider, this initial triage assessment does not replace that evaluation, and the importance of remaining in the ED until their evaluation is complete.  Per chart review patient has history of enhancing brain lesions.  Due to new and worsening headache will obtain noncontrast head CT.   Jennifer [name], [place] 04/27/21 [phone]

## 2021-04-27 NOTE — ED Notes (Signed)
E-signature pad unavailable at time of pt discharge. This RN discussed discharge materials with pt and answered all pt questions. Pt stated understanding of discharge material. ? ?

## 2021-04-27 NOTE — ED Notes (Signed)
Dr Billy Fischer at bedside using interpreter to perform assessment at this time.

## 2021-04-27 NOTE — Consult Note (Signed)
Neurology Consultation  Reason for Consult: HA Referring Physician: Ronnald Nian, A., MD.   CC: HA x 4 days, worsened today to 8/10.   History is obtained from: Daughter as patient is difficult to arouse.   HPI: Jennifer [name] is a 50 y.o. female with a PMHx of debility, HTN, HLD, HFpEF, AFib on Eliquis, NSTEMI 10/22, left eye visual deficit/glaucoma, DM II, multifocal CVA 11/22 with concern for infectious/inflammatory etiology with hemorrhagic left occipital lesion or no known etiology. However, there was suspicion for malilglnancy given her weight loss and deconditioning over prior few months. NSU consulted and did not do a bx of lesion. Out of concern for possible encephalitis/meningitis and SAH, a LP was performed without remarkable findings. Patient was to have a f/up MRI brain around now, but ended up in ED.   NP saw patient in 11/22. She had multifactorial encephalopathy. She had multiple blood and CSF tests which were unremarkable. EEG was negative for seizures but + for slowing consistent with encephalopathy. Her stay was complicated by hypotension, uncontrolled DM II, UTI, and ARF. She was discharged without a diagnosis for the brain lesion.   Today, history of present illness is obtained from daughter as patient is very somnolent. Per daughter, patient has had HAs off and on for a few months. However, over the past 4 days, they have been more intense, but still relieved by Acetaminophen. Today, her HA was 8/10 accompanies by n/v. When asked location, daughter holds her whole head. No weakness or extremity, dysphagia, dysarthria, aphasia, difficulty word finding, and no numbness of hands per the daughter, although listed on chart. Daughter showed NP picture of small amount of vomitus which is red/brown in color. Patient is on Eliquis and has a hx of GIB.   Neurology asked to consult for HA and known enhancing brain lesion.   ROS: Unable to obtain due to altered mental status.   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   Annotation: secondary to uveitis of unknown etiology Qualifier: Diagnosis of  By: Hilma Favors  DO, Beth     Hair loss    History of fracture of clavicle 05/18/2015   Hyperlipidemia    Hypertension    Hypertension associated with diabetes ([place]) 07/23/2016   Iron deficiency anemia 05/13/2013   Left anterior shoulder pain 07/06/2017   Pap smear abnormality of cervix with LGSIL    Routine/ritual circumcision    Type II diabetes mellitus ([place])    Uveitis    Family History  Problem Relation Age of Onset   Diabetes Father    Hypertension Other    Social History:   reports that she has never smoked. She has never used smokeless tobacco. She reports that she does not drink alcohol and does not use drugs.  Medications No current facility-administered medications for this encounter.  Current Outpatient Medications:    apixaban (ELIQUIS) 5 MG TABS tablet, Take 1 tablet (5 mg total) by mouth 2 (two) times daily., Disp: 60 tablet, Rfl: 0   brimonidine (ALPHAGAN) 0.2 % ophthalmic solution, Place 1 drop into the left eye 3 (three) times daily., Disp: 5 mL, Rfl: 12   dorzolamide-timolol (COSOPT) 22.3-6.8 MG/ML ophthalmic solution, Place  1 drop into the left eye 2 (two) times daily., Disp: 10 mL, Rfl: 12   gabapentin (NEURONTIN) 300 MG capsule, Take 1 capsule (300 mg total) by mouth at bedtime., Disp: 90 capsule, Rfl: 3   insulin aspart (NOVOLOG FLEXPEN) 100 UNIT/ML FlexPen, Inject 3 Units into the skin 3 (three) times daily with meals., Disp: 15 mL, Rfl: 11   acetaminophen (TYLENOL) 325 MG tablet, Take 2 tablets (650 mg total) by mouth every 6 (six) hours as needed  for mild pain (or Fever >/= 101)., Disp: , Rfl:    amLODipine (NORVASC) 10 MG tablet, Take 1 tablet (10 mg total) by mouth daily., Disp: 30 tablet, Rfl: 0   calcium citrate-vitamin D (CITRACAL+D) 315-200 MG-UNIT tablet, Take 2 tablets by mouth 2 (two) times daily. (Patient not taking: Reported on 04/27/2021), Disp: 360 tablet, Rfl: 3   insulin detemir (LEVEMIR) 100 UNIT/ML FlexPen, Inject 14 Units into the skin at bedtime., Disp: 15 mL, Rfl: 11   linagliptin (TRADJENTA) 5 MG TABS tablet, Take 1 tablet (5 mg total) by mouth daily., Disp: 90 tablet, Rfl: 0   magnesium gluconate (MAGONATE) 500 MG tablet, Take 0.5 tablets (250 mg total) by mouth at bedtime. (Patient not taking: Reported on 04/27/2021), Disp: 30 tablet, Rfl: 0   Multiple Vitamin (MULTIVITAMIN WITH MINERALS) TABS tablet, Take 1 tablet by mouth daily. (Patient not taking: Reported on 04/27/2021), Disp: , Rfl:   Exam: Current vital signs: BP (!) 160/91 (BP Location: Left Arm)    Pulse 96    Temp 98.3 F (36.8 C) (Oral)    Resp 18    LMP 08/21/2016 (Exact Date)    SpO2 100%  Vital signs in last 24 hours: Temp:  [97.5 F (36.4 C)-98.3 F (36.8 C)] 98.3 F (36.8 C) (01/06 1314) Pulse Rate:  [76-104] 96 (01/06 1552) Resp:  [16-20] 18 (01/06 1552) BP: (94-180)/(75-117) 160/91 (01/06 1552) SpO2:  [97 %-100 %] 100 % (01/06 1552)  PE: GENERAL: Somnolent female lying on ED stretcher with her head covered in NAD.  HEENT: normocephalic and atraumatic. LUNGS - Normal respiratory effort.  CV - RRR. ABDOMEN - Soft, nontender. Ext: warm, well perfused. Psych: affect sleepy, calm.   NEURO: NP unable to get an exam at this time due to patient's somnolence and lack of participation or talking after receiving MHA medicine (Benadryl, Compazine, Tylenol).   Mental Status: Alert and oriented to Speech/Language: speech is without dysarthria or aphasia.  Naming, repetition, fluency, and comprehension intact.  Cranial Nerves:  II: PERRL  mm/brisk.  visual fields full.  III, IV, VI: EOMI. Lid elevation symmetric and full.  V: sensation is intact and symmetrical to face. Blinks to threat. VII: Smile is symmetrical. Able to puff cheeks and raise eyebrows.  VIII:hearing intact to voice. IX, X: palate elevation is symmetric. Phonation normal.  XI: normal sternocleidomastoid and trapezius muscle strength. OLM:BEMLJQ is symmetrical without fasciculations.   Motor:  RUE: grips  /5       triceps /5     biceps  /5      LUE: grips  /5      triceps  /5      biceps   /5 RLE:  knee  /5    thigh  /5      plantar flexion  /5     dorsiflexion   /5 LLE:  knee  /5   thigh  /5     plantar flexion   /5     dorsiflexion   /  5 Tone is normal. Bulk is normal.  Sensation- Intact to light touch bilaterally in all four extremities. Extinction absent to light touch to DSS.  Coordination: FTN intact bilaterally. HKS intact bilaterally. No pronator drift.  DTRs:  RUE:  biceps      brachioradialis      triceps RLE:  patella      tibial LUE:  biceps     brachioradialis     triceps LLE: patella      tibial Gait- deferred.  NIHSS:  1a Level of Consciousness:  1b LOC Questions:  1c LOC Commands:  2 Best Gaze:  3 Visual:  4 Facial Palsy:  5a Motor Arm - left:  5b Motor Arm - Right:  6a Motor Leg - Left:  6b Motor Leg - Right:  7 Limb Ataxia:  8 Sensory:  9 Best Language:  10 Dysarthria:  11 Extinction and Inattention:  TOTAL:    Labs I have reviewed labs in epic and the results pertinent to this consultation are:   CBC    Component Value Date/Time   WBC 4.0 04/27/2021 0534   RBC 4.18 04/27/2021 0534   HGB 11.4 (L) 04/27/2021 0534   HGB 10.0 (L) 02/09/2021 1137   HCT 36.0 04/27/2021 0534   HCT 31.1 (L) 02/09/2021 1137   PLT 156 04/27/2021 0534   PLT 153 02/09/2021 1137   MCV 86.1 04/27/2021 0534   MCV 86 02/09/2021 1137   MCH 27.3 04/27/2021 0534   MCHC 31.7 04/27/2021 0534   RDW 12.9 04/27/2021 0534   RDW 11.9 02/09/2021 1137    LYMPHSABS 1.9 04/27/2021 0534   LYMPHSABS 1.6 02/09/2021 1137   MONOABS 0.2 04/27/2021 0534   EOSABS 0.2 04/27/2021 0534   EOSABS 0.2 02/09/2021 1137   BASOSABS 0.0 04/27/2021 0534   BASOSABS 0.0 02/09/2021 1137    CMP     Component Value Date/Time   NA 137 04/27/2021 0534   NA 138 02/09/2021 1137   K 3.0 (L) 04/27/2021 0534   CL 101 04/27/2021 0534   CO2 24 04/27/2021 0534   GLUCOSE 101 (H) 04/27/2021 0534   BUN 16 04/27/2021 0534   BUN 14 02/09/2021 1137   CREATININE 0.75 04/27/2021 0534   CREATININE 0.63 10/28/2014 1649   CALCIUM 9.5 04/27/2021 0534   PROT 7.2 04/27/2021 0534   PROT 6.8 01/30/2018 1440   ALBUMIN 3.4 (L) 04/27/2021 0534   ALBUMIN 3.6 01/30/2018 1440   AST 21 04/27/2021 0534   ALT 17 04/27/2021 0534   ALKPHOS 79 04/27/2021 0534   BILITOT 0.5 04/27/2021 0534   BILITOT <0.2 01/30/2018 1440   GFRNONAA >60 04/27/2021 0534   GFRNONAA >89 10/28/2014 1649   GFRAA 75 04/19/2020 1555   GFRAA >89 10/28/2014 1649    Lipid Panel     Component Value Date/Time   CHOL 212 (H) 02/04/2021 0215   CHOL 222 (H) 06/07/2019 1549   TRIG 81 02/04/2021 0215   HDL 54 02/04/2021 0215   HDL 56 06/07/2019 1549   CHOLHDL 3.9 02/04/2021 0215   VLDL 16 02/04/2021 0215   LDLCALC 142 (H) 02/04/2021 0215   LDLCALC 144 (H) 06/07/2019 1549     Imaging MD reviewed the images obtained.  CT head -Study limited by left-side-down patient positioning. There is ill-defined low-attenuation in the lateral aspect of the inferior left temporal lobe and left cerebellum likely reflecting artifact from the positioning. Hypo attenuation in the left occipital lobe is compatible with previous abnormalities documented by brain MRI  on 02/28/2021. However, given the presence of known disease recently in the left occipital region, repeat CT in 12-24 hours likely warranted to ensure that the changes in the left temporal lobe and cerebellum do indeed represent artifact. Based on clinical status,  brain MRI could also be used to further evaluate as warranted. -Chronic small vessel white matter ischemic disease.  MRI brain -Decreased size of the enhancing left occipital lesions with decreased edema. Findings may reflect resolving infarcts or inflammation. Improvement without treatment makes neoplasm highly unlikely. -No new intracranial abnormality. -Age advanced chronic small vessel ischemic disease and cerebral atrophy.  Assessment: 51 yo female, chronically ill, presenting with MHA. The enhancing lesion that was present in 11/22 is now decreased, and without treatment, doubt this is malignancy but resolving old infarcts. There is no new stroke on MRI brain.   Impression: -MHA.  -Enhancing left occipital lesions have decreased in size and less concerning of malignancy.  -multiple chronic medical issues.  -HTN needs better control.  -uncontrolled DM II.  -Debility.   Recommendations/Plan:  -Agree with migraine headache cocktail. -Given left occipital lesion is not thought to be malignant, she can likely go home if HA is controlled.  -Needs close f/up with a PCP to manage and improve her chronic conditions.  -Neuro with no further work up needed. F/up with out patient neurologist 4 weeks.   Pt seen by Clance Boll, NP/Neuro and later by MD. Note/plan to be edited by MD as needed.  Pager: [phone]   NEUROHOSPITALIST ADDENDUM Performed a face to face diagnostic evaluation.   I have reviewed the contents of history and physical exam as documented by PA/ARNP/Resident and agree with above documentation.  I have discussed and formulated the above plan as documented. Edits to the note have been made as needed.  Her headache had resolved when I assessed her. She is sleepy. I think it is better if she goes home. Sitting in the hospital is probably going to cause recurrence of headaches. I spoke with son and discussed that she needs better control of stroke risk factors and  discussed that she should see her PCP.  Donnetta Simpers, MD [facility] [phone]   If 7pm to 7am, please call on call as listed on AMION.

## 2021-04-30 ENCOUNTER — Encounter: Payer: Medicaid Other | Admitting: Internal Medicine

## 2021-05-03 ENCOUNTER — Ambulatory Visit (INDEPENDENT_AMBULATORY_CARE_PROVIDER_SITE_OTHER): Payer: Medicaid Other | Admitting: Internal Medicine

## 2021-05-03 ENCOUNTER — Other Ambulatory Visit: Payer: Self-pay

## 2021-05-03 ENCOUNTER — Encounter: Payer: Self-pay | Admitting: Internal Medicine

## 2021-05-03 VITALS — BP 127/73 | HR 74 | Temp 98.4°F | Resp 16 | Ht 66.0 in | Wt 150.9 lb

## 2021-05-03 DIAGNOSIS — Z23 Encounter for immunization: Secondary | ICD-10-CM | POA: Diagnosis not present

## 2021-05-03 DIAGNOSIS — I480 Paroxysmal atrial fibrillation: Secondary | ICD-10-CM | POA: Diagnosis not present

## 2021-05-03 DIAGNOSIS — E11319 Type 2 diabetes mellitus with unspecified diabetic retinopathy without macular edema: Secondary | ICD-10-CM | POA: Diagnosis present

## 2021-05-03 DIAGNOSIS — R5381 Other malaise: Secondary | ICD-10-CM

## 2021-05-03 DIAGNOSIS — Z8673 Personal history of transient ischemic attack (TIA), and cerebral infarction without residual deficits: Secondary | ICD-10-CM | POA: Diagnosis not present

## 2021-05-03 DIAGNOSIS — Z794 Long term (current) use of insulin: Secondary | ICD-10-CM

## 2021-05-03 DIAGNOSIS — E876 Hypokalemia: Secondary | ICD-10-CM | POA: Diagnosis not present

## 2021-05-03 DIAGNOSIS — R634 Abnormal weight loss: Secondary | ICD-10-CM | POA: Diagnosis not present

## 2021-05-03 LAB — POCT GLYCOSYLATED HEMOGLOBIN (HGB A1C): Hemoglobin A1C: 9.2 % — AB (ref 4.0–5.6)

## 2021-05-03 LAB — GLUCOSE, CAPILLARY: Glucose-Capillary: 96 mg/dL (ref 70–99)

## 2021-05-03 MED ORDER — AMLODIPINE BESYLATE 10 MG PO TABS: 10.0000 mg | ORAL_TABLET | Freq: Every day | ORAL | 0 refills | Status: DC

## 2021-05-03 NOTE — Progress Notes (Signed)
Office Visit   [name], female    DOB: 20-Nov-1970, 51 y.o.   MRN: [id number]   PCP: Jose Persia, MD   Subjective:   Jennifer [name] is a 51 y.o. year old female who presents for follow up of chronic conditions including diabetes, PAF, and physical deconditioning. No new complaints at today's office visit. Pt's son present in person at today's visit. Pt's daughter present via speakerphone. An in person interpreter was used for the entirety of today's visit.    Objective:   BP 127/73 (BP Location: Left Arm, Cuff Size: Normal)    Pulse 74    Temp 98.4 F (36.9 C) (Oral)    Resp 16    Ht 5\' 6"  (1.676 m)    Wt 150 lb 14.4 oz (68.4 kg)    LMP 08/21/2016 (Exact Date)    SpO2 100% Comment: room air   BMI 24.36 kg/m   General: chronically ill and frail sitting in wheelchair. overall appearance is remarkably improved from prior visits. Cardiac: RRR, no LE edema Pulm: breathing comfortably on room air, lungs clear MSK: braces present on the bilateral lower extremities. 4/5 strength in the bilateral lower extremities. LE are atrophic. Assessment & Plan:   Problem List Items Addressed This Visit       Cardiovascular and Mediastinum   PAF (paroxysmal atrial fibrillation) (HCC)    In sinus rhythm in the office today. Anticoagulated with eliquis although she has not been taking this consistently. She explains that she does not see a reason as her heart cath did not reveal any clots. Provided education to her and her family (son and daughter) regarding the purpose of anticoagulation in atrial fibrillation and the risks associated with not being on anticoagulation. She still seemed resistent to taking it but her son and daughter expressed understanding.  Continue eliquis, reassess adherence at next office visit      Relevant Medications   amLODipine (NORVASC) 10 MG tablet   rosuvastatin (CRESTOR) 20 MG tablet     Endocrine   Type II diabetes mellitus ([place]) - Primary  (Chronic)    Current prescribed medications: levemir 14U daily, novolog 3U with meals, tradjenta 5mg  daily, gabapetin 300mg  QHS She reports adherence to levemir and trajenta. She is only taking novolog 3U at night.  Admits to intermittent hypoglycemic events although these are not reflected on meter download today. Download as limited data. A1C is remarkably improved from 12.1 in October to 9.1 today. Continue current management, encouraged adherence to novolog with meals Given the remarkable drop in A1C, self reported occassional hypoglycemic events, and limited download data, no medication changes made today She will follow up in 3 months Continue to follow with [name] Recommended ophthalmology visit        Relevant Medications   rosuvastatin (CRESTOR) 20 MG tablet   Other Relevant Orders   POC Hbg A1C (Completed)     Other   Physical deconditioning (Chronic)    Home health referral placed at today's visit.      Relevant Orders   Home Health   Face-to-face encounter (required for Medicare/Medicaid patients)   Weight loss, non-intentional    Weights are relatively stable from october. Will continue to monitor.       History of stroke    Not a candidate for antiplatelet therapy due to anticoagulation with eliquis for atrial fibrillation and history of GI bleed.  Start crestor 20mg  daily.      RESOLVED: Hypokalemia    Resolved  on labs today. No further workup or management.       Relevant Orders   Basic metabolic panel (Completed)   Magnesium (Completed)   Other Visit Diagnoses     Need for immunization against influenza       Relevant Orders   Flu Vaccine QUAD 72mo+IM (Fluarix, Fluzone & Alfiuria Quad PF) (Completed)      Follow up in 3 months with PCP   Pt discussed with Dr. Forde Radon, MD Internal Medicine Resident PGY-3 [facility] 05/05/2021 8:40 AM

## 2021-05-03 NOTE — Patient Instructions (Addendum)
Your A1C looks much better today. Continue your current medications and follow up in 3 months.

## 2021-05-04 LAB — BASIC METABOLIC PANEL
BUN/Creatinine Ratio: 22 (ref 9–23)
BUN: 18 mg/dL (ref 6–24)
CO2: 25 mmol/L (ref 20–29)
Calcium: 9.4 mg/dL (ref 8.7–10.2)
Chloride: 102 mmol/L (ref 96–106)
Creatinine, Ser: 0.81 mg/dL (ref 0.57–1.00)
Glucose: 102 mg/dL — ABNORMAL HIGH (ref 70–99)
Potassium: 3.9 mmol/L (ref 3.5–5.2)
Sodium: 140 mmol/L (ref 134–144)
eGFR: 88 mL/min/{1.73_m2} (ref >59)

## 2021-05-04 LAB — MAGNESIUM: Magnesium: 1.9 mg/dL (ref 1.6–2.3)

## 2021-05-05 ENCOUNTER — Encounter: Payer: Self-pay | Admitting: Internal Medicine

## 2021-05-05 DIAGNOSIS — E876 Hypokalemia: Secondary | ICD-10-CM | POA: Insufficient documentation

## 2021-05-05 DIAGNOSIS — Z8673 Personal history of transient ischemic attack (TIA), and cerebral infarction without residual deficits: Secondary | ICD-10-CM | POA: Insufficient documentation

## 2021-05-05 MED ORDER — ROSUVASTATIN CALCIUM 20 MG PO TABS: 20.0000 mg | ORAL_TABLET | Freq: Every day | ORAL | 0 refills | Status: DC

## 2021-05-05 NOTE — Assessment & Plan Note (Signed)
Not a candidate for antiplatelet therapy due to anticoagulation with eliquis for atrial fibrillation and history of GI bleed.  Start crestor 20mg  daily.

## 2021-05-05 NOTE — Assessment & Plan Note (Signed)
Resolved on labs today. No further workup or management.

## 2021-05-05 NOTE — Assessment & Plan Note (Signed)
Home health referral placed at today's visit.

## 2021-05-05 NOTE — Assessment & Plan Note (Signed)
In sinus rhythm in the office today. Anticoagulated with eliquis although she has not been taking this consistently. She explains that she does not see a reason as her heart cath did not reveal any clots. Provided education to her and her family (son and daughter) regarding the purpose of anticoagulation in atrial fibrillation and the risks associated with not being on anticoagulation. She still seemed resistent to taking it but her son and daughter expressed understanding.   Continue eliquis, reassess adherence at next office visit

## 2021-05-05 NOTE — Assessment & Plan Note (Addendum)
Weights are relatively stable from october. Will continue to monitor.

## 2021-05-05 NOTE — Assessment & Plan Note (Signed)
Current prescribed medications: levemir 14U daily, novolog 3U with meals, tradjenta 5mg  daily, gabapetin 300mg  QHS She reports adherence to levemir and trajenta. She is only taking novolog 3U at night.  Admits to intermittent hypoglycemic events although these are not reflected on meter download today. Download as limited data. A1C is remarkably improved from 12.1 in October to 9.1 today.  Continue current management, encouraged adherence to novolog with meals  Given the remarkable drop in A1C, self reported occassional hypoglycemic events, and limited download data, no medication changes made today  She will follow up in 3 months  Continue to follow with [name]  Recommended ophthalmology visit

## 2021-05-09 NOTE — Progress Notes (Signed)
Internal Medicine Clinic Attending  Case discussed with Dr. Christian  At the time of the visit.  We reviewed the resident's history and exam and pertinent patient test results.  I agree with the assessment, diagnosis, and plan of care documented in the resident's note.  

## 2021-05-15 ENCOUNTER — Telehealth: Payer: Self-pay | Admitting: Internal Medicine

## 2021-05-15 NOTE — Telephone Encounter (Signed)
F/U call. Called pt's daughter, Runell Gess. Stated pt is feeling better but BS is 386. Reminded her to tell her mother to continue to drink water. Stated her brother will give pt her next dose of insulin when it's time.She does not want to schedule an appt ; if needed, she will call back tomorrow am.

## 2021-05-15 NOTE — Telephone Encounter (Signed)
Call transferred to Triage. Daughter stated earlier this am pt's BS were 484 and 436. Pt was given Levemir 14 U @ 0500Am then about one hour later around 0630Am pt was given Novolog 3 U. I asked how's she feeling - stated she feels weak and tired; no sob, n/v. Daughter placed on hold.  I talked to to Dr Cain Sieve - stated pt needs to drink plenty of water (hydration) and take next dose Novolog 3 U. If pt unable to drink; c/o n/v, she needs to go to the ED.  Daughter was not on the phone. Called her back - informed pt needs to start drinking plenty of water and give next dose of Novolog - daughter stated it should be around 1100Am. Told her to re-check BS afterwards; call me back. And if still high and pt unable to drink water and develops n/v go to the ED. Stated she understands.

## 2021-05-15 NOTE — Telephone Encounter (Signed)
Thank you for the update!

## 2021-05-15 NOTE — Telephone Encounter (Signed)
Called pt's Jennifer [name] to check on pt. Stated she has not been back home; she's working. But she did tell her father to make sure pt is drinking plenty of water, give her Novolog insulin and something to eat. Stated she will have her brother check pt's BS and give me a call back.

## 2021-05-15 NOTE — Telephone Encounter (Signed)
Pt's daughter reporting the pt's Blood Sugar is over 500. Earlier this morning the patient was Off balance, Sweaty and Shaking.  Call Transferred to Triage Nurse.

## 2021-05-16 ENCOUNTER — Emergency Department (HOSPITAL_COMMUNITY): Payer: Medicaid Other

## 2021-05-16 ENCOUNTER — Other Ambulatory Visit: Payer: Self-pay

## 2021-05-16 ENCOUNTER — Inpatient Hospital Stay (HOSPITAL_COMMUNITY)
Admission: EM | Admit: 2021-05-16 | Discharge: 2021-05-18 | DRG: 638 | Disposition: A | Discharge status: Home / Self Care | Payer: Medicaid Other | Attending: Internal Medicine | Admitting: Internal Medicine

## 2021-05-16 ENCOUNTER — Encounter (HOSPITAL_COMMUNITY): Payer: Self-pay

## 2021-05-16 DIAGNOSIS — R350 Frequency of micturition: Secondary | ICD-10-CM | POA: Diagnosis present

## 2021-05-16 DIAGNOSIS — R339 Retention of urine, unspecified: Secondary | ICD-10-CM | POA: Diagnosis present

## 2021-05-16 DIAGNOSIS — Z8673 Personal history of transient ischemic attack (TIA), and cerebral infarction without residual deficits: Secondary | ICD-10-CM

## 2021-05-16 DIAGNOSIS — I252 Old myocardial infarction: Secondary | ICD-10-CM

## 2021-05-16 DIAGNOSIS — R053 Chronic cough: Secondary | ICD-10-CM | POA: Diagnosis present

## 2021-05-16 DIAGNOSIS — Z79899 Other long term (current) drug therapy: Secondary | ICD-10-CM

## 2021-05-16 DIAGNOSIS — E860 Dehydration: Secondary | ICD-10-CM | POA: Diagnosis present

## 2021-05-16 DIAGNOSIS — D72819 Decreased white blood cell count, unspecified: Secondary | ICD-10-CM | POA: Diagnosis present

## 2021-05-16 DIAGNOSIS — H409 Unspecified glaucoma: Secondary | ICD-10-CM | POA: Diagnosis present

## 2021-05-16 DIAGNOSIS — E785 Hyperlipidemia, unspecified: Secondary | ICD-10-CM | POA: Diagnosis present

## 2021-05-16 DIAGNOSIS — R778 Other specified abnormalities of plasma proteins: Secondary | ICD-10-CM

## 2021-05-16 DIAGNOSIS — Y92009 Unspecified place in unspecified non-institutional (private) residence as the place of occurrence of the external cause: Secondary | ICD-10-CM

## 2021-05-16 DIAGNOSIS — E1165 Type 2 diabetes mellitus with hyperglycemia: Secondary | ICD-10-CM | POA: Diagnosis present

## 2021-05-16 DIAGNOSIS — Z20822 Contact with and (suspected) exposure to covid-19: Secondary | ICD-10-CM | POA: Diagnosis present

## 2021-05-16 DIAGNOSIS — M79604 Pain in right leg: Secondary | ICD-10-CM | POA: Diagnosis present

## 2021-05-16 DIAGNOSIS — Z9181 History of falling: Secondary | ICD-10-CM

## 2021-05-16 DIAGNOSIS — Z833 Family history of diabetes mellitus: Secondary | ICD-10-CM

## 2021-05-16 DIAGNOSIS — I5030 Unspecified diastolic (congestive) heart failure: Secondary | ICD-10-CM | POA: Diagnosis present

## 2021-05-16 DIAGNOSIS — T383X6A Underdosing of insulin and oral hypoglycemic [antidiabetic] drugs, initial encounter: Secondary | ICD-10-CM | POA: Diagnosis present

## 2021-05-16 DIAGNOSIS — D696 Thrombocytopenia, unspecified: Secondary | ICD-10-CM | POA: Diagnosis present

## 2021-05-16 DIAGNOSIS — R531 Weakness: Secondary | ICD-10-CM

## 2021-05-16 DIAGNOSIS — I2510 Atherosclerotic heart disease of native coronary artery without angina pectoris: Secondary | ICD-10-CM | POA: Diagnosis present

## 2021-05-16 DIAGNOSIS — E1110 Type 2 diabetes mellitus with ketoacidosis without coma: Principal | ICD-10-CM | POA: Diagnosis present

## 2021-05-16 DIAGNOSIS — Z7984 Long term (current) use of oral hypoglycemic drugs: Secondary | ICD-10-CM

## 2021-05-16 DIAGNOSIS — I248 Other forms of acute ischemic heart disease: Secondary | ICD-10-CM | POA: Diagnosis present

## 2021-05-16 DIAGNOSIS — E119 Type 2 diabetes mellitus without complications: Secondary | ICD-10-CM

## 2021-05-16 DIAGNOSIS — N179 Acute kidney failure, unspecified: Secondary | ICD-10-CM | POA: Diagnosis present

## 2021-05-16 DIAGNOSIS — Z8249 Family history of ischemic heart disease and other diseases of the circulatory system: Secondary | ICD-10-CM

## 2021-05-16 DIAGNOSIS — Z7901 Long term (current) use of anticoagulants: Secondary | ICD-10-CM

## 2021-05-16 DIAGNOSIS — M79605 Pain in left leg: Secondary | ICD-10-CM | POA: Diagnosis present

## 2021-05-16 DIAGNOSIS — R739 Hyperglycemia, unspecified: Secondary | ICD-10-CM

## 2021-05-16 DIAGNOSIS — H5462 Unqualified visual loss, left eye, normal vision right eye: Secondary | ICD-10-CM | POA: Diagnosis present

## 2021-05-16 DIAGNOSIS — I4891 Unspecified atrial fibrillation: Secondary | ICD-10-CM

## 2021-05-16 DIAGNOSIS — I152 Hypertension secondary to endocrine disorders: Secondary | ICD-10-CM | POA: Diagnosis present

## 2021-05-16 DIAGNOSIS — R269 Unspecified abnormalities of gait and mobility: Secondary | ICD-10-CM | POA: Diagnosis present

## 2021-05-16 DIAGNOSIS — Z8616 Personal history of COVID-19: Secondary | ICD-10-CM

## 2021-05-16 DIAGNOSIS — Z794 Long term (current) use of insulin: Secondary | ICD-10-CM

## 2021-05-16 DIAGNOSIS — I480 Paroxysmal atrial fibrillation: Secondary | ICD-10-CM | POA: Diagnosis present

## 2021-05-16 LAB — BASIC METABOLIC PANEL
Anion gap: 15 (ref 5–15)
Anion gap: 12 (ref 5–15)
BUN: 27 mg/dL — ABNORMAL HIGH (ref 6–20)
BUN: 25 mg/dL — ABNORMAL HIGH (ref 6–20)
CO2: 17 mmol/L — ABNORMAL LOW (ref 22–32)
CO2: 19 mmol/L — ABNORMAL LOW (ref 22–32)
Calcium: 9.4 mg/dL (ref 8.9–10.3)
Calcium: 8.9 mg/dL (ref 8.9–10.3)
Chloride: 102 mmol/L (ref 98–111)
Chloride: 103 mmol/L (ref 98–111)
Creatinine, Ser: 1.29 mg/dL — ABNORMAL HIGH (ref 0.44–1.00)
Creatinine, Ser: 1.1 mg/dL — ABNORMAL HIGH (ref 0.44–1.00)
GFR, Estimated: 51 mL/min — ABNORMAL LOW (ref >60)
GFR, Estimated: >60 mL/min (ref >60)
Glucose, Bld: 327 mg/dL — ABNORMAL HIGH (ref 70–99)
Glucose, Bld: 392 mg/dL — ABNORMAL HIGH (ref 70–99)
Potassium: 4 mmol/L (ref 3.5–5.1)
Potassium: 4.4 mmol/L (ref 3.5–5.1)
Sodium: 134 mmol/L — ABNORMAL LOW (ref 135–145)
Sodium: 134 mmol/L — ABNORMAL LOW (ref 135–145)

## 2021-05-16 LAB — HEPATIC FUNCTION PANEL
ALT: 13 U/L (ref 0–44)
AST: 17 U/L (ref 15–41)
Albumin: 3.1 g/dL — ABNORMAL LOW (ref 3.5–5.0)
Alkaline Phosphatase: 73 U/L (ref 38–126)
Bilirubin, Direct: <0.1 mg/dL (ref 0.0–0.2)
Total Bilirubin: 0.6 mg/dL (ref 0.3–1.2)
Total Protein: 6.9 g/dL (ref 6.5–8.1)

## 2021-05-16 LAB — CBG MONITORING, ED
Glucose-Capillary: 199 mg/dL — ABNORMAL HIGH (ref 70–99)
Glucose-Capillary: 280 mg/dL — ABNORMAL HIGH (ref 70–99)
Glucose-Capillary: 331 mg/dL — ABNORMAL HIGH (ref 70–99)
Glucose-Capillary: 372 mg/dL — ABNORMAL HIGH (ref 70–99)
Glucose-Capillary: 379 mg/dL — ABNORMAL HIGH (ref 70–99)

## 2021-05-16 LAB — I-STAT VENOUS BLOOD GAS, ED
Acid-base deficit: 2 mmol/L (ref 0.0–2.0)
Bicarbonate: 22.8 mmol/L (ref 20.0–28.0)
Calcium, Ion: 1.16 mmol/L (ref 1.15–1.40)
HCT: 35 % — ABNORMAL LOW (ref 36.0–46.0)
Hemoglobin: 11.9 g/dL — ABNORMAL LOW (ref 12.0–15.0)
O2 Saturation: 79 %
Potassium: 4.5 mmol/L (ref 3.5–5.1)
Sodium: 135 mmol/L (ref 135–145)
TCO2: 24 mmol/L (ref 22–32)
pCO2, Ven: 38.8 mmHg — ABNORMAL LOW (ref 44.0–60.0)
pH, Ven: 7.377 (ref 7.250–7.430)
pO2, Ven: 45 mmHg (ref 32.0–45.0)

## 2021-05-16 LAB — URINALYSIS, ROUTINE W REFLEX MICROSCOPIC
Bilirubin Urine: NEGATIVE
Glucose, UA: >=500 mg/dL — AB
Ketones, ur: 20 mg/dL — AB
Nitrite: NEGATIVE
Protein, ur: >=300 mg/dL — AB
Specific Gravity, Urine: 1.018 (ref 1.005–1.030)
WBC, UA: >50 WBC/hpf — ABNORMAL HIGH (ref 0–5)
pH: 5 (ref 5.0–8.0)

## 2021-05-16 LAB — BETA-HYDROXYBUTYRIC ACID: Beta-Hydroxybutyric Acid: 1.4 mmol/L — ABNORMAL HIGH (ref 0.05–0.27)

## 2021-05-16 LAB — CBC
HCT: 45 % (ref 36.0–46.0)
Hemoglobin: 13.2 g/dL (ref 12.0–15.0)
MCH: 26.2 pg (ref 26.0–34.0)
MCHC: 29.3 g/dL — ABNORMAL LOW (ref 30.0–36.0)
MCV: 89.5 fL (ref 80.0–100.0)
Platelets: 132 10*3/uL — ABNORMAL LOW (ref 150–400)
RBC: 5.03 MIL/uL (ref 3.87–5.11)
RDW: 12.3 % (ref 11.5–15.5)
WBC: 3.8 10*3/uL — ABNORMAL LOW (ref 4.0–10.5)
nRBC: 0 % (ref 0.0–0.2)

## 2021-05-16 LAB — AMMONIA: Ammonia: 26 umol/L (ref 9–35)

## 2021-05-16 LAB — RESP PANEL BY RT-PCR (FLU A&B, COVID) ARPGX2
Influenza A by PCR: NEGATIVE
Influenza B by PCR: NEGATIVE
SARS Coronavirus 2 by RT PCR: NEGATIVE

## 2021-05-16 LAB — TROPONIN I (HIGH SENSITIVITY): Troponin I (High Sensitivity): 13 ng/L (ref <18)

## 2021-05-16 LAB — I-STAT BETA HCG BLOOD, ED (MC, WL, AP ONLY): I-stat hCG, quantitative: <5 m[IU]/mL (ref <5)

## 2021-05-16 LAB — LIPASE, BLOOD: Lipase: 23 U/L (ref 11–51)

## 2021-05-16 LAB — TSH: TSH: 1.287 u[IU]/mL (ref 0.350–4.500)

## 2021-05-16 LAB — LACTIC ACID, PLASMA: Lactic Acid, Venous: 1.4 mmol/L (ref 0.5–1.9)

## 2021-05-16 LAB — MAGNESIUM: Magnesium: 2 mg/dL (ref 1.7–2.4)

## 2021-05-16 MED ORDER — INSULIN REGULAR(HUMAN) IN NACL 100-0.9 UT/100ML-% IV SOLN
INTRAVENOUS | Status: DC
  Administered 2021-05-16: 22:00:00 10 [IU]/h via INTRAVENOUS
  Filled 2021-05-16: qty 100

## 2021-05-16 MED ORDER — APIXABAN 5 MG PO TABS
5.0000 mg | ORAL_TABLET | Freq: Two times a day (BID) | ORAL | Status: DC
  Administered 2021-05-16 – 2021-05-18 (×4): 5 mg via ORAL
  Filled 2021-05-16 (×4): qty 1

## 2021-05-16 MED ORDER — SODIUM CHLORIDE 0.9 % IV BOLUS
1000.0000 mL | Freq: Once | INTRAVENOUS | Status: AC
  Administered 2021-05-16: 18:00:00 1000 mL via INTRAVENOUS

## 2021-05-16 MED ORDER — ACETAMINOPHEN 650 MG RE SUPP: 650.0000 mg | Freq: Four times a day (QID) | RECTAL | Status: DC | PRN

## 2021-05-16 MED ORDER — POTASSIUM CHLORIDE 10 MEQ/100ML IV SOLN
10.0000 meq | INTRAVENOUS | Status: AC
  Administered 2021-05-16 (×2): 10 meq via INTRAVENOUS
  Filled 2021-05-16 (×2): qty 100

## 2021-05-16 MED ORDER — AMLODIPINE BESYLATE 10 MG PO TABS
10.0000 mg | ORAL_TABLET | Freq: Every day | ORAL | Status: DC
  Administered 2021-05-16 – 2021-05-17 (×2): 10 mg via ORAL
  Filled 2021-05-16 (×2): qty 2

## 2021-05-16 MED ORDER — GABAPENTIN 300 MG PO CAPS
300.0000 mg | ORAL_CAPSULE | Freq: Every day | ORAL | Status: DC
  Administered 2021-05-16 – 2021-05-17 (×2): 300 mg via ORAL
  Filled 2021-05-16 (×2): qty 1

## 2021-05-16 MED ORDER — SODIUM CHLORIDE 0.9 % IV BOLUS
1000.0000 mL | Freq: Once | INTRAVENOUS | Status: AC
Start: 2021-05-16 — End: 2021-05-17
  Administered 2021-05-16: 21:00:00 1000 mL via INTRAVENOUS

## 2021-05-16 MED ORDER — INSULIN ASPART 100 UNIT/ML IJ SOLN
0.0000 [IU] | INTRAMUSCULAR | Status: DC
  Administered 2021-05-16: 20:00:00 15 [IU] via SUBCUTANEOUS

## 2021-05-16 MED ORDER — INSULIN ASPART 100 UNIT/ML IJ SOLN: 0.0000 [IU] | INTRAMUSCULAR | Status: DC

## 2021-05-16 MED ORDER — SODIUM CHLORIDE 0.9 % IV BOLUS
1000.0000 mL | Freq: Once | INTRAVENOUS | Status: AC
  Administered 2021-05-16: 13:00:00 1000 mL via INTRAVENOUS

## 2021-05-16 MED ORDER — DILTIAZEM HCL 25 MG/5ML IV SOLN: 15.0000 mg | Freq: Once | INTRAVENOUS | Status: DC

## 2021-05-16 MED ORDER — LACTATED RINGERS IV BOLUS: 20.0000 mL/kg | Freq: Once | INTRAVENOUS | Status: DC

## 2021-05-16 MED ORDER — ACETAMINOPHEN 325 MG PO TABS: 650.0000 mg | ORAL_TABLET | Freq: Four times a day (QID) | ORAL | Status: DC | PRN

## 2021-05-16 MED ORDER — ROSUVASTATIN CALCIUM 20 MG PO TABS
20.0000 mg | ORAL_TABLET | Freq: Every day | ORAL | Status: DC
Start: 2021-05-16 — End: 2021-05-18
  Administered 2021-05-16 – 2021-05-18 (×3): 20 mg via ORAL
  Filled 2021-05-16 (×3): qty 1

## 2021-05-16 MED ORDER — DILTIAZEM HCL 25 MG/5ML IV SOLN
10.0000 mg | Freq: Once | INTRAVENOUS | Status: AC
  Administered 2021-05-16: 13:00:00 10 mg via INTRAVENOUS
  Filled 2021-05-16: qty 5

## 2021-05-16 MED ORDER — DEXTROSE IN LACTATED RINGERS 5 % IV SOLN: INTRAVENOUS | Status: DC

## 2021-05-16 MED ORDER — DEXTROSE 50 % IV SOLN: 0.0000 mL | INTRAVENOUS | Status: DC | PRN

## 2021-05-16 MED ORDER — ONDANSETRON HCL 4 MG/2ML IJ SOLN
4.0000 mg | Freq: Once | INTRAMUSCULAR | Status: AC
  Administered 2021-05-16: 14:00:00 4 mg via INTRAVENOUS
  Filled 2021-05-16: qty 2

## 2021-05-16 MED ORDER — LACTATED RINGERS IV SOLN: INTRAVENOUS | Status: DC

## 2021-05-16 NOTE — ED Notes (Signed)
IV team at bedside 

## 2021-05-16 NOTE — ED Provider Notes (Signed)
[facility] EMERGENCY DEPARTMENT Provider Note   CSN: [id number] Arrival date & time: 05/16/21  1141     History  Chief Complaint  Patient presents with   Hyperglycemia    Jennifer [name] is a 51 y.o. female with a past medical history of type 1 diabetes, severe depression, stage III kidney disease, blindness of the left eye, who presents the emergency department for hyperglycemia and weakness.  Patient also has a history cva. Per previous note by Dr. Lorrin Goodell on 04/27/2021 she has a hx of afib on RVR. patient is Arabic speaking and professional translation services are utilized along with the patient's daughter who speaks fluent [place].  Patient's husband is at bedside and also provides some history daughter reports that the patient has been extremely weak and feel that she is going to pass out whenever she stands up.  She has had a persistent cough since last time she was here in the emergency department.  She was nauseous and had vomiting today.  Daughter has been trying to help her with her hyperglycemia and had spoken with the patient's primary care doctor who advised her to continue giving her insulin and fluids.  Today the patient was so weak she could barely get out of bed and they Brought her to the emergency department.  Daughter reports that the last time they were in the emergency department they had brain imaging that "showed something in her head."  She was told they were supposed to repeat imaging in 4 weeks however she states "she is sick again and she is here now anyway."  Patient has not had any ataxia, severe headache.   Hyperglycemia     Home Medications Prior to Admission medications   Medication Sig Start Date End Date Taking? Authorizing Provider  acetaminophen (TYLENOL) 325 MG tablet Take 2 tablets (650 mg total) by mouth every 6 (six) hours as needed for mild pain (or Fever >/= 101). 04/03/21   Angiulli, Lavon Paganini, PA-C  amLODipine (NORVASC) 10 MG  tablet Take 1 tablet (10 mg total) by mouth daily. 05/03/21   Mitzi Hansen, MD  apixaban (ELIQUIS) 5 MG TABS tablet Take 1 tablet (5 mg total) by mouth 2 (two) times daily. 04/03/21   Angiulli, Lavon Paganini, PA-C  brimonidine (ALPHAGAN) 0.2 % ophthalmic solution Place 1 drop into the left eye 3 (three) times daily. 04/03/21   Angiulli, Lavon Paganini, PA-C  calcium citrate-vitamin D (CITRACAL+D) 315-200 MG-UNIT tablet Take 2 tablets by mouth 2 (two) times daily. Patient not taking: Reported on 04/27/2021 04/03/21 04/03/22  Angiulli, Lavon Paganini, PA-C  dorzolamide-timolol (COSOPT) 22.3-6.8 MG/ML ophthalmic solution Place 1 drop into the left eye 2 (two) times daily. 04/03/21   Angiulli, Lavon Paganini, PA-C  gabapentin (NEURONTIN) 300 MG capsule Take 1 capsule (300 mg total) by mouth at bedtime. 04/03/21 04/03/22  Angiulli, Lavon Paganini, PA-C  insulin aspart (NOVOLOG FLEXPEN) 100 UNIT/ML FlexPen Inject 3 Units into the skin 3 (three) times daily with meals. 04/14/21   Meredith Staggers, MD  insulin detemir (LEVEMIR) 100 UNIT/ML FlexPen Inject 14 Units into the skin at bedtime. 04/14/21   Meredith Staggers, MD  linagliptin (TRADJENTA) 5 MG TABS tablet Take 1 tablet (5 mg total) by mouth daily. 04/03/21 05/03/21  Angiulli, Lavon Paganini, PA-C  magnesium gluconate (MAGONATE) 500 MG tablet Take 0.5 tablets (250 mg total) by mouth at bedtime. Patient not taking: Reported on 04/27/2021 04/03/21   Cathlyn Parsons, PA-C  Multiple Vitamin (MULTIVITAMIN WITH MINERALS)  TABS tablet Take 1 tablet by mouth daily. Patient not taking: Reported on 04/27/2021 03/18/21   Debbe Odea, MD  rosuvastatin (CRESTOR) 20 MG tablet Take 1 tablet (20 mg total) by mouth daily. 05/05/21 05/05/22  Mitzi Hansen, MD  Continuous Blood Gluc Sensor (DEXCOM G6 SENSOR) MISC Use to check blood sugar at least 6 times a day Patient not taking: Reported on 08/29/2020 08/09/20 09/27/20  Harvie Heck, MD  Continuous Blood Gluc Transmit (DEXCOM G6 TRANSMITTER) MISC Use to  check blood sugar at least 6 times a day Patient not taking: Reported on 08/29/2020 08/09/20 09/27/20  Harvie Heck, MD      Allergies    Patient has no known allergies.    Review of Systems   Review of Systems As per the HPI Physical Exam Updated Vital Signs BP (!) 168/143    Pulse 98    Temp 97.8 F (36.6 C)    Resp 20    Ht 5\' 6"  (1.676 m)    Wt 68.4 kg    LMP 08/21/2016 (Exact Date)    SpO2 100%    BMI 24.34 kg/m  Physical Exam Vitals and nursing note reviewed.  Constitutional:      General: She is not in acute distress.    Appearance: She is well-developed. She is not diaphoretic.  HENT:     Head: Normocephalic and atraumatic.     Right Ear: External ear normal.     Left Ear: External ear normal.     Nose: Nose normal.     Mouth/Throat:     Mouth: Mucous membranes are moist.  Eyes:     General: No scleral icterus.    Conjunctiva/sclera: Conjunctivae normal.     Comments: Left eye with corneal opacification.  Extraocular movements are intact  Cardiovascular:     Rate and Rhythm: Regular rhythm. Tachycardia present.     Heart sounds: Normal heart sounds. No murmur heard.   No friction rub. No gallop.  Pulmonary:     Effort: Pulmonary effort is normal. No respiratory distress.     Breath sounds: Normal breath sounds.  Abdominal:     General: Bowel sounds are normal. There is no distension.     Palpations: Abdomen is soft. There is no mass.     Tenderness: There is no abdominal tenderness. There is no guarding.  Musculoskeletal:     Cervical back: Normal range of motion.     Right lower leg: No edema.     Left lower leg: No edema.  Skin:    General: Skin is warm and dry.  Neurological:     Mental Status: She is alert and oriented to person, place, and time.     Cranial Nerves: No cranial nerve deficit.     Sensory: No sensory deficit.     Motor: No weakness.     Coordination: Coordination normal.     Gait: Gait normal.     Deep Tendon Reflexes: Reflexes normal.   Psychiatric:        Behavior: Behavior normal.    ED Results / Procedures / Treatments   Labs (all labs ordered are listed, but only abnormal results are displayed) Labs Reviewed  BASIC METABOLIC PANEL - Abnormal; Notable for the following components:      Result Value   Sodium 134 (*)    CO2 17 (*)    Glucose, Bld 327 (*)    BUN 27 (*)    Creatinine, Ser 1.29 (*)    GFR,  Estimated 51 (*)    All other components within normal limits  CBC - Abnormal; Notable for the following components:   WBC 3.8 (*)    MCHC 29.3 (*)    Platelets 132 (*)    All other components within normal limits  HEPATIC FUNCTION PANEL - Abnormal; Notable for the following components:   Albumin 3.1 (*)    All other components within normal limits  CBG MONITORING, ED - Abnormal; Notable for the following components:   Glucose-Capillary 331 (*)    All other components within normal limits  TROPONIN I (HIGH SENSITIVITY) - Abnormal; Notable for the following components:   Troponin I (High Sensitivity) 60 (*)    All other components within normal limits  RESP PANEL BY RT-PCR (FLU A&B, COVID) ARPGX2  AMMONIA  LACTIC ACID, PLASMA  LIPASE, BLOOD  MAGNESIUM  TSH  URINALYSIS, ROUTINE W REFLEX MICROSCOPIC  BASIC METABOLIC PANEL  I-STAT BETA HCG BLOOD, ED ([facility], [facility], [facility] ONLY)  TROPONIN I (HIGH SENSITIVITY)    EKG EKG Interpretation  Date/Time:  Wednesday May 16 2021 14:27:54 EST Ventricular Rate:  89 PR Interval:  139 QRS Duration: 106 QT Interval:  388 QTC Calculation: 473 R Axis:   -12 Text Interpretation: Sinus rhythm Abnormal R-wave progression, early transition Since last tracing Sinus rhythm has replaced Atrial fibrillation Confirmed by Calvert Cantor [phone]) on 05/16/2021 2:56:15 PM  Radiology CT Head Wo Contrast  Result Date: 05/16/2021 CLINICAL DATA:  Dizziness. EXAM: CT HEAD WITHOUT CONTRAST TECHNIQUE: Contiguous axial images were obtained from the base of the skull through the vertex  without intravenous contrast. RADIATION DOSE REDUCTION: This exam was performed according to the departmental dose-optimization program which includes automated exposure control, adjustment of the mA and/or kV according to patient size and/or use of iterative reconstruction technique. COMPARISON:  Head CT and brain MRI 04/27/2021 FINDINGS: Brain: Exam is somewhat limited by motion despite attempting rescanning. Small left occipital brain lesion is again demonstrated. This is better evaluated on MRI examination no acute intracranial findings such as hemispheric infarction or intracranial hemorrhage. No extra-axial fluid collections are identified. Vascular: No hyperdense vessel or unexpected calcification. Skull: No skull fracture or bone lesion. Sinuses/Orbits: The paranasal sinuses and mastoid air cells are clear. The globes are intact. Other: No scalp lesions or scalp hematoma. IMPRESSION: 1. Stable small left occipital brain lesion. 2. No acute intracranial findings. Electronically Signed   By: Marijo Sanes M.D.   On: 05/16/2021 15:57   DG Chest Port 1 View  Result Date: 05/16/2021 CLINICAL DATA:  Hyperglycemia. EXAM: PORTABLE CHEST 1 VIEW COMPARISON:  February 27, 2021. FINDINGS: The heart size and mediastinal contours are within normal limits. Both lungs are clear. The visualized skeletal structures are unremarkable. IMPRESSION: No active disease. Electronically Signed   By: Marijo Conception M.D.   On: 05/16/2021 13:26    Procedures Procedures    Medications Ordered in ED Medications  sodium chloride 0.9 % bolus 1,000 mL (has no administration in time range)  sodium chloride 0.9 % bolus 1,000 mL (1,000 mLs Intravenous New Bag/Given 05/16/21 1316)  diltiazem (CARDIZEM) injection 10 mg (10 mg Intravenous Given 05/16/21 1317)  ondansetron (ZOFRAN) injection 4 mg (4 mg Intravenous Given 05/16/21 1421)    ED Course/ Medical Decision Making/ A&P Clinical Course as of 05/17/21 0956  Wed May 16, 2021   1313 Pulse Rate(!): 144 Rapid AFIB [AH]  1313 I-stat hCG, quantitative: <5.0 [AH]  1417 WBC(!): 3.8 [AH]  1417 Creatinine(!): 1.29 AKI  [  AH]  1417 Anion gap: 15 [AH]  1441 Repeat EKG shows NSR at a rate of 89.  Patient's CHA2DS2-VASc score is 3.   [AH]  1728 Lactic acid, plasma [AH]  1728 Ammonia [AH]  1728 Magnesium [AH]  1728 Lipase, blood [AH]  1728 Troponin I (High Sensitivity)(!): 60 ? Demand ischemia [AH]  1730 EKG 12-Lead [AH]  1730 ED EKG Initial ekg showed AFIB w rvr at 136- repeat nsr [AH]    Clinical Course User Index [AH] Margarita Mail, PA-C       CHA2DS2-VASc Score: 3                    Medical Decision Making Problems Addressed: AKI (acute kidney injury) [facility]): acute illness or injury Atrial fibrillation with RVR ([place]): self-limited or minor problem Elevated troponin: acute illness or injury  Amount and/or Complexity of Data Reviewed Labs: ordered. Decision-making details documented in ED Course. Radiology: ordered. ECG/medicine tests: ordered and independent interpretation performed. Decision-making details documented in ED Course.  Risk Prescription drug management. Decision regarding hospitalization.   This patient presents to the ED for concern of weakness, this involves an extensive number of treatment options, and is a complaint that carries with it a high risk of complications and morbidity.  The differential diagnosis includes The differential diagnosis of weakness includes but is not limited to neurologic causes (GBS, myasthenia gravis, CVA, MS, ALS, transverse myelitis, spinal cord injury, CVA, botulism, ) and other causes: ACS, Arrhythmia, syncope, orthostatic hypotension, sepsis, hypoglycemia, electrolyte disturbance, hypothyroidism, respiratory failure, symptomatic anemia, dehydration, heat injury, polypharmacy, malignancy.    Co morbidities that complicate the patient evaluation  poorly controlled IDDM, hx of stroke, severe depression,  recent cva   Additional history obtained:  Additional history obtained from father, daughter     Lab Tests:  I Ordered, and personally interpreted labs.  The pertinent results include:  as per ed course   Imaging Studies ordered:  I ordered imaging studies including ct head and cxr I independently visualized and interpreted imaging which showed no acute findings I agree with the radiologist interpretation   Cardiac Monitoring:  The patient was maintained on a cardiac monitor.  I personally viewed and interpreted the cardiac monitored which showed an underlying rhythm of: intially afib w/rvr, now nsr   Medicines ordered and prescription drug management:  I ordered medication including diltiazem  for afib w rvr Reevaluation of the patient after these medicines showed that the patient improved I have reviewed the patients home medicines and have made adjustments as needed   Test Considered:  considered repeat MRI, however pt had recent mri w contrast and patient appears to have no new neuro deficits   Critical Interventions:  fluids for aki, CCB for rapid afib   Consultations Obtained:  I requested consultation with the Internal medicine service,  and discussed lab and imaging findings as well as pertinent plan - they recommend: admission and close monitorint   Problem List / ED Course:  AKI- acute, Rapid AFIB -resolved (paroxysmal on eliquis), elevated troponins -? Demand, hyperglycemia   Reevaluation:  After the interventions noted above, I reevaluated the patient and found that they have :improved   Social Determinants of Health:  language barrier, severe depression preventing continued investment in self care   Dispostion:  After consideration of the diagnostic results and the patients response to treatment, I feel that the patent would benefit from admission.   Final Clinical Impression(s) / ED Diagnoses Final diagnoses:  Hyperglycemia  AKI (acute kidney injury) ([place])  Elevated troponin  Atrial fibrillation with RVR [facility])    Rx / DC Orders ED Discharge Orders     None         Margarita Mail, PA-C 05/17/21 [id number]    Drenda Freeze, MD 05/17/21 [id number]

## 2021-05-16 NOTE — ED Triage Notes (Signed)
Pt arrived via [facility] from home for hyperglycemia, AMS, HA and neck pain. Per EMS, pt's daughter states pt's glucose was 506. Pt is in A-fib w/RVR. Pt is A&Ox2 to self and place.

## 2021-05-16 NOTE — ED Notes (Signed)
Phlebotomist is coming to draw remaining ordered labs.

## 2021-05-16 NOTE — ED Notes (Signed)
Phlebotomist is aware that pt needs labs drawn.

## 2021-05-16 NOTE — H&P (Signed)
Date: 05/16/2021               Patient Name:  Jennifer [name] MRN: [id number]  DOB: 19-Jul-1970 Age / Sex: 51 y.o., female   PCP: Jose Persia, MD         Medical Service: Internal Medicine Teaching Service         Attending Physician: Dr. Charise Killian, MD    First Contact: Dr. Christiana Fuchs Pager: [phone]  Second Contact: Dr. Gaylan Gerold  Pager: [phone]       After Hours (After 5p/  First Contact Pager: [phone]  weekends / holidays): Second Contact Pager: [phone]   Chief Complaint: Weakness  History of Present Illness:   Jennifer [name] is a 51 year old female with past medical history of type 1 diabetes, atrial fibrillation on Eliquis, NSTEMI, left eye glaucoma, hypertension, hyperlipidemia, CVA and recent diagnosis of a brain lesion who presented to the ED for weakness.  Very difficult to obtain history when patient and her caretaker are talking the same time.  The interpreter is also having a hard time translating for [place].  Patient reports severe headache, nausea, vomiting that started 2 days ago.  Her caretaker said that she was very weak, could not stand up on her own and had very poor p.o. intake.  She fell in the bathroom twice.  Patient denies any sick contacts or respiratory symptoms.  She endorses abdominal pain but denies diarrhea.  Endorses diminished urine output but no dysuria.  Patient reports checking her blood sugar at home but is unsure of her insulin regimen.  In the ED, BMP showed AGMA with CBG of 327.  CBC show leukopenia of 3.7 and thrombocytopenia of 132.  Hemoglobin 13.2, this may reflect hemoconcentration.  Also have A. fib with RVR on arrival and converted after 1 dose of IV Cardizem 10 mg.  Meds:  No outpatient medications have been marked as taking for the 05/16/21 encounter [facility] Encounter).     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   Annotation: secondary to uveitis of unknown etiology Qualifier: Diagnosis of  By: Hilma Favors  DO, Beth     Hair loss    History of fracture of clavicle 05/18/2015   Hyperlipidemia    Hypertension    Hypertension associated with diabetes ([place]) 07/23/2016   Iron deficiency anemia 05/13/2013   Left anterior shoulder pain 07/06/2017   NSTEMI (non-ST elevated myocardial infarction) ([place]) 02/04/2021   Pap smear abnormality of cervix with LGSIL    Routine/ritual circumcision    Type II diabetes mellitus ([place])    Uveitis     Family History:  Family History  Problem Relation Age of Onset   Diabetes Father    Hypertension Other      Social History:  -Unable to obtain  Review of Systems: A complete ROS was negative except as per HPI.    Physical Exam: Blood pressure 134/71, pulse 95, temperature 97.8 F (36.6 C), resp. rate 19, height 5\' 6"  (1.676 m), weight 68.4 kg, last menstrual period  08/21/2016, SpO2 100 %. Constitutional: ill-appearing, laying in bed, in no acute distress HENT: normocephalic atraumatic, mucous membranes dry Eyes: conjunctiva non-erythematous Neck: supple Cardiovascular: regular rate and rhythm, no m/r/g Pulmonary/Chest: normal work of breathing on room air, lungs clear to auscultation bilaterally Abdominal: soft, non-tender, non-distended MSK: normal bulk and tone Neurological: alert & oriented x 3, 5/5 strength in bilateral upper extremities, 2/5 strength in bilateral lower extremities,  Skin: warm and dry Psych: normal mood and affect   EKG: personally reviewed my interpretation is sinus rhythm.  CXR: personally  reviewed my interpretation is normal.  Assessment & Plan by Problem: Principal Problem:   DKA (diabetic ketoacidosis) ([place]) Active Problems:   Type II diabetes mellitus ([place])   (HFpEF) heart failure with preserved ejection fraction (HCC)   PAF (paroxysmal atrial fibrillation) ([place])   Possible DKA Uncontrolled type 2 diabetes She presented with symptom of nausea, vomiting, abdominal pain and weakness.  Also had an anion gap metabolic acidosis, raise high suspicion for DKA.  Unfortunately this is an ongoing issue due to insulin nonadherence and confusion at home. Patient appears stable on exam with no active vomiting.  She appears dehydrated.  Her VBG is not very acidotic. -Will check STAT BMP and beta-hydroxy -Will start SSI q4h for now pending repeat labs. If confirmed DKA, will start Endotool -I try to call patient's son to get more information about her insulin.  Unfortunately could not reach him -Resume gabapentin for neuropathy  Paroxysmal A. Fib Currently sinus rhythm. -Resume Eliquis  Hypertension -Resume amlodipine 10 mg  Hyperlipidemia -On Crestor 20 mg  Brain lesion She was admitted in November for a new brain lesion, thought is due to an a subacute stroke.  Meningitis and subarachnoid hemorrhage were ruled out.  CT head showed stable brain lesion.  No new focal neurological deficits found on physical exam.  Full code Diet: N/A DVT: Eliquis IVF: Normal saline  Dispo: Admit patient to Observation with expected length of stay less than 2 midnights.  Signed: Christiana Fuchs, DO 05/16/2021, 7:28 PM  Pager: [phone] After 5pm on weekdays and 1pm on weekends: On Call pager: [phone]

## 2021-05-16 NOTE — ED Notes (Signed)
Family requesting help getting home health set up

## 2021-05-17 ENCOUNTER — Observation Stay (HOSPITAL_COMMUNITY): Payer: Medicaid Other

## 2021-05-17 DIAGNOSIS — E860 Dehydration: Secondary | ICD-10-CM | POA: Diagnosis present

## 2021-05-17 DIAGNOSIS — R339 Retention of urine, unspecified: Secondary | ICD-10-CM

## 2021-05-17 DIAGNOSIS — E1165 Type 2 diabetes mellitus with hyperglycemia: Secondary | ICD-10-CM | POA: Diagnosis present

## 2021-05-17 DIAGNOSIS — Z8673 Personal history of transient ischemic attack (TIA), and cerebral infarction without residual deficits: Secondary | ICD-10-CM | POA: Diagnosis not present

## 2021-05-17 DIAGNOSIS — R531 Weakness: Secondary | ICD-10-CM | POA: Diagnosis not present

## 2021-05-17 DIAGNOSIS — E1110 Type 2 diabetes mellitus with ketoacidosis without coma: Principal | ICD-10-CM

## 2021-05-17 DIAGNOSIS — I252 Old myocardial infarction: Secondary | ICD-10-CM | POA: Diagnosis not present

## 2021-05-17 DIAGNOSIS — I248 Other forms of acute ischemic heart disease: Secondary | ICD-10-CM | POA: Diagnosis present

## 2021-05-17 DIAGNOSIS — N179 Acute kidney failure, unspecified: Secondary | ICD-10-CM | POA: Diagnosis present

## 2021-05-17 DIAGNOSIS — I5030 Unspecified diastolic (congestive) heart failure: Secondary | ICD-10-CM

## 2021-05-17 DIAGNOSIS — Z794 Long term (current) use of insulin: Secondary | ICD-10-CM | POA: Diagnosis not present

## 2021-05-17 DIAGNOSIS — D72819 Decreased white blood cell count, unspecified: Secondary | ICD-10-CM | POA: Diagnosis present

## 2021-05-17 DIAGNOSIS — Y92009 Unspecified place in unspecified non-institutional (private) residence as the place of occurrence of the external cause: Secondary | ICD-10-CM | POA: Diagnosis not present

## 2021-05-17 DIAGNOSIS — D696 Thrombocytopenia, unspecified: Secondary | ICD-10-CM | POA: Diagnosis present

## 2021-05-17 DIAGNOSIS — Z79899 Other long term (current) drug therapy: Secondary | ICD-10-CM | POA: Diagnosis not present

## 2021-05-17 DIAGNOSIS — T383X6A Underdosing of insulin and oral hypoglycemic [antidiabetic] drugs, initial encounter: Secondary | ICD-10-CM | POA: Diagnosis present

## 2021-05-17 DIAGNOSIS — Z20822 Contact with and (suspected) exposure to covid-19: Secondary | ICD-10-CM | POA: Diagnosis present

## 2021-05-17 DIAGNOSIS — I480 Paroxysmal atrial fibrillation: Secondary | ICD-10-CM | POA: Diagnosis present

## 2021-05-17 DIAGNOSIS — Z7901 Long term (current) use of anticoagulants: Secondary | ICD-10-CM | POA: Diagnosis not present

## 2021-05-17 DIAGNOSIS — H409 Unspecified glaucoma: Secondary | ICD-10-CM | POA: Diagnosis present

## 2021-05-17 DIAGNOSIS — E785 Hyperlipidemia, unspecified: Secondary | ICD-10-CM | POA: Diagnosis present

## 2021-05-17 DIAGNOSIS — I152 Hypertension secondary to endocrine disorders: Secondary | ICD-10-CM | POA: Diagnosis present

## 2021-05-17 DIAGNOSIS — Z8616 Personal history of COVID-19: Secondary | ICD-10-CM | POA: Diagnosis not present

## 2021-05-17 DIAGNOSIS — Z9181 History of falling: Secondary | ICD-10-CM | POA: Diagnosis not present

## 2021-05-17 DIAGNOSIS — M79605 Pain in left leg: Secondary | ICD-10-CM | POA: Diagnosis present

## 2021-05-17 DIAGNOSIS — M79604 Pain in right leg: Secondary | ICD-10-CM | POA: Diagnosis present

## 2021-05-17 LAB — BASIC METABOLIC PANEL
Anion gap: 4 — ABNORMAL LOW (ref 5–15)
Anion gap: 7 (ref 5–15)
BUN: 27 mg/dL — ABNORMAL HIGH (ref 6–20)
BUN: 24 mg/dL — ABNORMAL HIGH (ref 6–20)
CO2: 24 mmol/L (ref 22–32)
CO2: 23 mmol/L (ref 22–32)
Calcium: 8.3 mg/dL — ABNORMAL LOW (ref 8.9–10.3)
Calcium: 8.9 mg/dL (ref 8.9–10.3)
Chloride: 109 mmol/L (ref 98–111)
Chloride: 108 mmol/L (ref 98–111)
Creatinine, Ser: 1.29 mg/dL — ABNORMAL HIGH (ref 0.44–1.00)
Creatinine, Ser: 1.08 mg/dL — ABNORMAL HIGH (ref 0.44–1.00)
GFR, Estimated: 51 mL/min — ABNORMAL LOW (ref >60)
GFR, Estimated: >60 mL/min (ref >60)
Glucose, Bld: 141 mg/dL — ABNORMAL HIGH (ref 70–99)
Glucose, Bld: 155 mg/dL — ABNORMAL HIGH (ref 70–99)
Potassium: 3.7 mmol/L (ref 3.5–5.1)
Potassium: 4.2 mmol/L (ref 3.5–5.1)
Sodium: 137 mmol/L (ref 135–145)
Sodium: 138 mmol/L (ref 135–145)

## 2021-05-17 LAB — CBG MONITORING, ED
Glucose-Capillary: 115 mg/dL — ABNORMAL HIGH (ref 70–99)
Glucose-Capillary: 121 mg/dL — ABNORMAL HIGH (ref 70–99)
Glucose-Capillary: 128 mg/dL — ABNORMAL HIGH (ref 70–99)
Glucose-Capillary: 133 mg/dL — ABNORMAL HIGH (ref 70–99)
Glucose-Capillary: 134 mg/dL — ABNORMAL HIGH (ref 70–99)
Glucose-Capillary: 138 mg/dL — ABNORMAL HIGH (ref 70–99)
Glucose-Capillary: 147 mg/dL — ABNORMAL HIGH (ref 70–99)
Glucose-Capillary: 149 mg/dL — ABNORMAL HIGH (ref 70–99)
Glucose-Capillary: 88 mg/dL (ref 70–99)

## 2021-05-17 LAB — CBC
HCT: 35.1 % — ABNORMAL LOW (ref 36.0–46.0)
Hemoglobin: 10.9 g/dL — ABNORMAL LOW (ref 12.0–15.0)
MCH: 26.5 pg (ref 26.0–34.0)
MCHC: 31.1 g/dL (ref 30.0–36.0)
MCV: 85.4 fL (ref 80.0–100.0)
Platelets: 136 10*3/uL — ABNORMAL LOW (ref 150–400)
RBC: 4.11 MIL/uL (ref 3.87–5.11)
RDW: 12.5 % (ref 11.5–15.5)
WBC: 4.8 10*3/uL (ref 4.0–10.5)
nRBC: 0 % (ref 0.0–0.2)

## 2021-05-17 LAB — TROPONIN I (HIGH SENSITIVITY)
Troponin I (High Sensitivity): 60 ng/L — ABNORMAL HIGH (ref <18)
Troponin I (High Sensitivity): 314 ng/L (ref <18)
Troponin I (High Sensitivity): 345 ng/L (ref <18)
Troponin I (High Sensitivity): 311 ng/L (ref <18)

## 2021-05-17 LAB — BETA-HYDROXYBUTYRIC ACID: Beta-Hydroxybutyric Acid: 0.06 mmol/L (ref 0.05–0.27)

## 2021-05-17 LAB — GLUCOSE, CAPILLARY: Glucose-Capillary: 230 mg/dL — ABNORMAL HIGH (ref 70–99)

## 2021-05-17 MED ORDER — DEXTROSE IN LACTATED RINGERS 5 % IV SOLN: INTRAVENOUS | Status: DC

## 2021-05-17 MED ORDER — INSULIN ASPART 100 UNIT/ML IJ SOLN
0.0000 [IU] | Freq: Three times a day (TID) | INTRAMUSCULAR | Status: DC
  Administered 2021-05-17: 3 [IU] via SUBCUTANEOUS
  Administered 2021-05-18: 2 [IU] via SUBCUTANEOUS
  Administered 2021-05-18: 5 [IU] via SUBCUTANEOUS

## 2021-05-17 MED ORDER — METOPROLOL SUCCINATE ER 50 MG PO TB24
50.0000 mg | ORAL_TABLET | Freq: Every day | ORAL | Status: DC
  Administered 2021-05-17 – 2021-05-18 (×2): 50 mg via ORAL
  Filled 2021-05-17 (×2): qty 1

## 2021-05-17 MED ORDER — INSULIN ASPART 100 UNIT/ML IJ SOLN
0.0000 [IU] | Freq: Every day | INTRAMUSCULAR | Status: DC
  Administered 2021-05-17: 2 [IU] via SUBCUTANEOUS

## 2021-05-17 MED ORDER — CIPROFLOXACIN HCL 500 MG PO TABS
250.0000 mg | ORAL_TABLET | Freq: Two times a day (BID) | ORAL | Status: DC
  Administered 2021-05-17 – 2021-05-18 (×2): 250 mg via ORAL
  Filled 2021-05-17 (×2): qty 1

## 2021-05-17 MED ORDER — LISINOPRIL 5 MG PO TABS
5.0000 mg | ORAL_TABLET | Freq: Every day | ORAL | Status: DC
  Administered 2021-05-18: 5 mg via ORAL
  Filled 2021-05-17: qty 1

## 2021-05-17 MED ORDER — INSULIN ASPART 100 UNIT/ML IJ SOLN
3.0000 [IU] | Freq: Three times a day (TID) | INTRAMUSCULAR | Status: DC
  Administered 2021-05-17 (×2): 3 [IU] via SUBCUTANEOUS

## 2021-05-17 MED ORDER — INSULIN DETEMIR 100 UNIT/ML ~~LOC~~ SOLN
12.0000 [IU] | Freq: Every day | SUBCUTANEOUS | Status: DC
  Administered 2021-05-17 – 2021-05-18 (×2): 12 [IU] via SUBCUTANEOUS
  Filled 2021-05-17 (×2): qty 0.12

## 2021-05-17 MED ORDER — LACTATED RINGERS IV SOLN: INTRAVENOUS | Status: DC

## 2021-05-17 MED ORDER — AMLODIPINE BESYLATE 5 MG PO TABS
5.0000 mg | ORAL_TABLET | Freq: Every day | ORAL | Status: DC
  Administered 2021-05-18: 5 mg via ORAL
  Filled 2021-05-17: qty 1

## 2021-05-17 NOTE — ED Notes (Signed)
Discussed pt plan of care with admitting provider. Results BHA 0.06 and last Anion gap >4. Per admitting ordered for transition to sub Q insulin to be placed. Pt to stay on endotool till post 2 hrs of sub q insulin administration.

## 2021-05-17 NOTE — Consult Note (Signed)
Referring Physician: G. Lau/K. Masters  Jennifer [name] is an 51 y.o. female.                       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 Range. She also had atrial fibrillation with RVR that spontaneously returned to sinus rhythm. Patient denies chest pain. Her cardiac cath 3 months ago showed mild distal LAD disease otherwise unremarkable.  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   Annotation: secondary to uveitis of unknown etiology Qualifier: Diagnosis of  By: Hilma Favors  DO, Beth     Hair loss    History of fracture of clavicle 05/18/2015   Hyperlipidemia    Hypertension    Hypertension associated with diabetes ([facility]) 07/23/2016   Iron deficiency anemia 05/13/2013   Left anterior shoulder pain 07/06/2017   NSTEMI (non-ST elevated myocardial infarction) ([facility]) 02/04/2021   Pap smear abnormality of cervix with LGSIL    Routine/ritual circumcision    Type II diabetes mellitus ([place] [facility] [facility])    Uveitis       Past Surgical History:  Procedure Laterality Date   CESAREAN SECTION     x 1   EYE SURGERY Bilateral    Lasik   LEFT HEART CATH AND CORONARY ANGIOGRAPHY N/A 02/05/2021   Procedure: LEFT HEART CATH AND CORONARY ANGIOGRAPHY;  Surgeon: Dixie Dials, MD;  Location: [facility];  Service: Cardiovascular;  Laterality: N/A;   MULTIPLE TOOTH EXTRACTIONS     bottom denture   ORIF ANKLE FRACTURE Right  05/05/2020   Procedure: OPEN REDUCTION INTERNAL FIXATION (ORIF) ANKLE FRACTURE;  Surgeon: Shona Needles, MD;  Location: [facility];  Service: Orthopedics;  Laterality: Right;    Family History  Problem Relation Age of Onset   Diabetes Father    Hypertension Other    Social History:  reports that she has never smoked. She has never used smokeless tobacco. She reports that she does not drink alcohol and does not use drugs.  Allergies: No Known Allergies  Medications Prior to Admission  Medication Sig Dispense Refill   acetaminophen (TYLENOL) 325 MG tablet Take 2 tablets (650 mg total) by mouth every 6 (six) hours as needed for mild pain (or Fever >/= 101).     amLODipine (NORVASC) 2.5 MG tablet Take 2.5 mg by mouth 2 (two) times daily.     apixaban (ELIQUIS) 5 MG TABS tablet Take 1 tablet (5 mg total) by mouth 2 (two) times daily. 60 tablet 0   brimonidine (ALPHAGAN) 0.2 % ophthalmic solution Place 1 drop into the left eye 3 (three) times daily. (Patient taking differently: Place 1 drop into both eyes 3 (three) times daily.) 5 mL 12   dorzolamide-timolol (COSOPT) 22.3-6.8 MG/ML ophthalmic solution Place 1 drop into the left eye 2 (two) times daily. 10 mL 12   gabapentin (NEURONTIN) 300 MG capsule Take 1 capsule (300 mg total) by  mouth at bedtime. (Patient taking differently: Take 300 mg by mouth 3 (three) times daily.) 90 capsule 3   insulin aspart (NOVOLOG FLEXPEN) 100 UNIT/ML FlexPen Inject 3 Units into the skin 3 (three) times daily with meals. 15 mL 11   insulin detemir (LEVEMIR) 100 UNIT/ML FlexPen Inject 14 Units into the skin at bedtime. 15 mL 11   linagliptin (TRADJENTA) 5 MG TABS tablet Take 1 tablet (5 mg total) by mouth daily. 90 tablet 0   amLODipine (NORVASC) 10 MG tablet Take 1 tablet (10 mg total) by mouth daily. (Patient not taking: Reported on 05/17/2021) 90 tablet 0   calcium citrate-vitamin D (CITRACAL+D) 315-200 MG-UNIT tablet Take 2 tablets by mouth 2 (two) times daily.  (Patient not taking: Reported on 04/27/2021) 360 tablet 3   magnesium gluconate (MAGONATE) 500 MG tablet Take 0.5 tablets (250 mg total) by mouth at bedtime. (Patient not taking: Reported on 05/17/2021) 30 tablet 0   Multiple Vitamin (MULTIVITAMIN WITH MINERALS) TABS tablet Take 1 tablet by mouth daily. (Patient not taking: Reported on 04/27/2021)     rosuvastatin (CRESTOR) 20 MG tablet Take 1 tablet (20 mg total) by mouth daily. (Patient not taking: Reported on 05/17/2021) 90 tablet 0    Results for orders placed or performed during the hospital encounter of 05/16/21 (from the past 48 hour(s))  CBG monitoring, ED     Status: Abnormal   Collection Time: 05/16/21 12:05 PM  Result Value Ref Range   Glucose-Capillary 331 (H) 70 - 99 mg/dL    Comment: Glucose reference range applies only to samples taken after fasting for at least 8 hours.   Comment 1 Notify RN    Comment 2 Document in Chart   Basic metabolic panel     Status: Abnormal   Collection Time: 05/16/21 12:07 PM  Result Value Ref Range   Sodium 134 (L) 135 - 145 mmol/L   Potassium 4.0 3.5 - 5.1 mmol/L   Chloride 102 98 - 111 mmol/L   CO2 17 (L) 22 - 32 mmol/L   Glucose, Bld 327 (H) 70 - 99 mg/dL    Comment: Glucose reference range applies only to samples taken after fasting for at least 8 hours.   BUN 27 (H) 6 - 20 mg/dL   Creatinine, Ser 1.29 (H) 0.44 - 1.00 mg/dL   Calcium 9.4 8.9 - 10.3 mg/dL   GFR, Estimated 51 (L) >60 mL/min    Comment: (NOTE) Calculated using the CKD-EPI Creatinine Equation (2021)    Anion gap 15 5 - 15    Comment: Performed at [facility] [address] [place], [place] [address]  CBC     Status: Abnormal   Collection Time: 05/16/21 12:07 PM  Result Value Ref Range   WBC 3.8 (L) 4.0 - 10.5 K/uL   RBC 5.03 3.87 - 5.11 MIL/uL   Hemoglobin 13.2 12.0 - 15.0 g/dL   HCT 45.0 36.0 - 46.0 %   MCV 89.5 80.0 - 100.0 fL   MCH 26.2 26.0 - 34.0 pg   MCHC 29.3 (L) 30.0 - 36.0 g/dL   RDW 12.3 11.5 - 15.5 %    Platelets 132 (L) 150 - 400 K/uL    Comment: PLATELET COUNT CONFIRMED BY SMEAR   nRBC 0.0 0.0 - 0.2 %    Comment: Performed at [facility], [place] [address] [place], [place] [address]  Troponin I (High Sensitivity)     Status: None   Collection Time: 05/16/21 12:07 PM  Result Value Ref Range   Troponin I (High Sensitivity) 13 <18 ng/L    Comment: (NOTE) Elevated high sensitivity troponin I (hsTnI) values and significant  changes across serial measurements may suggest ACS but many other  chronic and acute conditions are known to elevate hsTnI results.  Refer to the "Links" section for chest pain algorithms and additional  guidance. Performed at [facility], [place] [address], [place], [place] [address]   I-Stat beta hCG blood, ED     Status: None   Collection Time: 05/16/21 12:31 PM  Result Value Ref Range   I-stat hCG, quantitative <5.0 <5 mIU/mL   Comment 3            Comment:   GEST. AGE      CONC.  (mIU/mL)   <=1 WEEK        5 - 50     2 WEEKS       50 - 500     3 WEEKS       100 - 10,000     4 WEEKS     1,000 - 30,000        FEMALE AND NON-PREGNANT FEMALE:     LESS THAN 5 mIU/mL   Ammonia     Status: None   Collection Time: 05/16/21  2:31 PM  Result Value Ref Range   Ammonia 26 9 - 35 umol/L    Comment: Performed at [facility], [place] [address], [place], [place] [address]  Lactic acid, plasma     Status: None   Collection Time: 05/16/21  2:31 PM  Result Value Ref Range   Lactic Acid, Venous 1.4 0.5 - 1.9 mmol/L    Comment: Performed at [facility] [address] [place], [place] [address]  Hepatic function panel     Status: Abnormal   Collection Time: 05/16/21  2:31 PM  Result Value Ref Range   Total Protein 6.9 6.5 - 8.1 g/dL   Albumin 3.1 (L) 3.5 - 5.0 g/dL   AST 17 15 - 41 U/L   ALT 13 0 - 44 U/L   Alkaline Phosphatase 73 38 - 126 U/L   Total Bilirubin 0.6 0.3 - 1.2 mg/dL   Bilirubin, Direct <0.1 0.0 - 0.2 mg/dL   Indirect Bilirubin  NOT CALCULATED 0.3 - 0.9 mg/dL    Comment: Performed at [facility] [address], [place], [place] [address]  Lipase, blood     Status: None   Collection Time: 05/16/21  2:31 PM  Result Value Ref Range   Lipase 23 11 - 51 U/L    Comment: Performed at [facility], [place] [address], [place], [place] [address]  Magnesium     Status: None   Collection Time: 05/16/21  2:31 PM  Result Value Ref Range   Magnesium 2.0 1.7 - 2.4 mg/dL    Comment: Performed at [facility], [place] [address] [place], [place] [address]  TSH     Status: None   Collection Time: 05/16/21  2:31 PM  Result Value Ref Range   TSH 1.287 0.350 - 4.500 uIU/mL    Comment: Performed by a 3rd Generation assay with a functional sensitivity of <=0.01 uIU/mL. Performed at [facility], [place] [address], [place], [place] [address]   Troponin I (High Sensitivity)     Status: Abnormal   Collection Time: 05/16/21  2:31 PM  Result Value Ref Range  Troponin I (High Sensitivity) 60 (H) <18 ng/L    Comment: RESULT CALLED TO, READ BACK BY AND VERIFIED WITH: PREVIOUSLY NOTED AS RBV (NOTE) Elevated high sensitivity troponin I (hsTnI) values and significant  changes across serial measurements may suggest ACS but many other  chronic and acute conditions are known to elevate hsTnI results.  Refer to the Links section for chest pain algorithms and additional  guidance. Performed at [facility], [place] [address], [place], [place] [address] CORRECTED ON 01/26 AT 1209: PREVIOUSLY REPORTED AS 60 CRITICAL RESULT CALLED TO, READ BACK BY AND VERIFIED WITH: Bronson Ing, RN 05/16/21 @1551  BY APPIAH D   Resp Panel by RT-PCR (Flu A&B, Covid) Nasopharyngeal Swab     Status: None   Collection Time: 05/16/21  4:35 PM   Specimen: Nasopharyngeal Swab; Nasopharyngeal(NP) swabs in vial transport medium  Result Value Ref Range   SARS Coronavirus 2 by RT PCR NEGATIVE NEGATIVE    Comment: (NOTE) SARS-CoV-2 target nucleic acids  are NOT DETECTED.  The SARS-CoV-2 RNA is generally detectable in upper respiratory specimens during the acute phase of infection. The lowest concentration of SARS-CoV-2 viral copies this assay can detect is 138 copies/mL. A negative result does not preclude SARS-Cov-2 infection and should not be used as the sole basis for treatment or other patient management decisions. A negative result may occur with  improper specimen collection/handling, submission of specimen other than nasopharyngeal swab, presence of viral mutation(s) within the areas targeted by this assay, and inadequate number of viral copies(<138 copies/mL). A negative result must be combined with clinical observations, patient history, and epidemiological information. The expected result is Negative.  Fact Sheet for Patients:  EntrepreneurPulse.com.au  Fact Sheet for Healthcare Providers:  IncredibleEmployment.be  This test is no t yet approved or cleared by the [place] FDA and  has been authorized for detection and/or diagnosis of SARS-CoV-2 by FDA under an Emergency Use Authorization (EUA). This EUA will remain  in effect (meaning this test can be used) for the duration of the COVID-19 declaration under Section 564(b)(1) of the Act, 21 U.S.C.section 360bbb-3(b)(1), unless the authorization is terminated  or revoked sooner.       Influenza A by PCR NEGATIVE NEGATIVE   Influenza B by PCR NEGATIVE NEGATIVE    Comment: (NOTE) The Xpert Xpress SARS-CoV-2/FLU/RSV plus assay is intended as an aid in the diagnosis of influenza from Nasopharyngeal swab specimens and should not be used as a sole basis for treatment. Nasal washings and aspirates are unacceptable for Xpert Xpress SARS-CoV-2/FLU/RSV testing.  Fact Sheet for Patients: EntrepreneurPulse.com.au  Fact Sheet for Healthcare Providers: IncredibleEmployment.be  This test is not yet  approved or cleared by the [place] FDA and has been authorized for detection and/or diagnosis of SARS-CoV-2 by FDA under an Emergency Use Authorization (EUA). This EUA will remain in effect (meaning this test can be used) for the duration of the COVID-19 declaration under Section 564(b)(1) of the Act, 21 U.S.C. section 360bbb-3(b)(1), unless the authorization is terminated or revoked.  Performed at [facility], [place] [address], [place], [place] [address]   Basic metabolic panel     Status: Abnormal   Collection Time: 05/16/21  6:47 PM  Result Value Ref Range   Sodium 134 (L) 135 - 145 mmol/L   Potassium 4.4 3.5 - 5.1 mmol/L   Chloride 103 98 - 111 mmol/L   CO2 19 (L) 22 - 32 mmol/L   Glucose, Bld 392 (H) 70 -  99 mg/dL    Comment: Glucose reference range applies only to samples taken after fasting for at least 8 hours.   BUN 25 (H) 6 - 20 mg/dL   Creatinine, Ser 1.10 (H) 0.44 - 1.00 mg/dL   Calcium 8.9 8.9 - 10.3 mg/dL   GFR, Estimated >60 >60 mL/min    Comment: (NOTE) Calculated using the CKD-EPI Creatinine Equation (2021)    Anion gap 12 5 - 15    Comment: Performed at [facility] [address] [place], [place] [address]  Beta-hydroxybutyric acid     Status: Abnormal   Collection Time: 05/16/21  6:47 PM  Result Value Ref Range   Beta-Hydroxybutyric Acid 1.40 (H) 0.05 - 0.27 mmol/L    Comment: Performed at [facility] [address] [place], [place] [address]  Troponin I (High Sensitivity)     Status: Abnormal   Collection Time: 05/16/21  6:47 PM  Result Value Ref Range   Troponin I (High Sensitivity) 314 (HH) <18 ng/L    Comment: CRITICAL RESULT CALLED TO, READ BACK BY AND VERIFIED WITH: WHITLEY J, RN 05/17/21 @1206  PREVIOUSLY NOTED AS CRTPRC BUT IT IS RBV (NOTE) Elevated high sensitivity troponin I (hsTnI) values and significant  changes across serial measurements may suggest ACS but many other  chronic and acute conditions are known to  elevate hsTnI results.  Refer to the Links section for chest pain algorithms and additional  guidance. Performed at [facility], [place] [address], [place],  [address] CORRECTED ON 01/26 AT 1207: PREVIOUSLY REPORTED AS 314 CRITICAL VALUE NOTED.  VALUE IS CONSISTENT WITH PREVIOUSLY REPORTED AND CALLED VALUE.   I-Stat venous blood gas, ED     Status: Abnormal   Collection Time: 05/16/21  6:57 PM  Result Value Ref Range   pH, Ven 7.377 7.250 - 7.430   pCO2, Ven 38.8 (L) 44.0 - 60.0 mmHg   pO2, Ven 45.0 32.0 - 45.0 mmHg   Bicarbonate 22.8 20.0 - 28.0 mmol/L   TCO2 24 22 - 32 mmol/L   O2 Saturation 79.0 %   Acid-base deficit 2.0 0.0 - 2.0 mmol/L   Sodium 135 135 - 145 mmol/L   Potassium 4.5 3.5 - 5.1 mmol/L   Calcium, Ion 1.16 1.15 - 1.40 mmol/L   HCT 35.0 (L) 36.0 - 46.0 %   Hemoglobin 11.9 (L) 12.0 - 15.0 g/dL   Sample type VENOUS   CBG monitoring, ED     Status: Abnormal   Collection Time: 05/16/21  7:18 PM  Result Value Ref Range   Glucose-Capillary 379 (H) 70 - 99 mg/dL    Comment: Glucose reference range applies only to samples taken after fasting for at least 8 hours.   Comment 1 Notify RN    Comment 2 Document in Chart   Urinalysis, Routine w reflex microscopic     Status: Abnormal   Collection Time: 05/16/21  7:41 PM  Result Value Ref Range   Color, Urine YELLOW YELLOW   APPearance HAZY (A) CLEAR   Specific Gravity, Urine 1.018 1.005 - 1.030   pH 5.0 5.0 - 8.0   Glucose, UA >=500 (A) NEGATIVE mg/dL   Hgb urine dipstick SMALL (A) NEGATIVE   Bilirubin Urine NEGATIVE NEGATIVE   Ketones, ur 20 (A) NEGATIVE mg/dL   Protein, ur >=300 (A) NEGATIVE mg/dL   Nitrite NEGATIVE NEGATIVE   Leukocytes,Ua LARGE (A) NEGATIVE   RBC / HPF 6-10 0 - 5 RBC/hpf   WBC, UA >  50 (H) 0 - 5 WBC/hpf   Bacteria, UA FEW (A) NONE SEEN   Squamous Epithelial / LPF 0-5 0 - 5   Budding Yeast PRESENT    Hyaline Casts, UA PRESENT     Comment: Performed at [facility], [place]  [address], [place], [place] [address]  CBG monitoring, ED     Status: Abnormal   Collection Time: 05/16/21  8:20 PM  Result Value Ref Range   Glucose-Capillary 372 (H) 70 - 99 mg/dL    Comment: Glucose reference range applies only to samples taken after fasting for at least 8 hours.  CBG monitoring, ED     Status: Abnormal   Collection Time: 05/16/21 10:19 PM  Result Value Ref Range   Glucose-Capillary 280 (H) 70 - 99 mg/dL    Comment: Glucose reference range applies only to samples taken after fasting for at least 8 hours.  CBG monitoring, ED     Status: Abnormal   Collection Time: 05/16/21 11:31 PM  Result Value Ref Range   Glucose-Capillary 199 (H) 70 - 99 mg/dL    Comment: Glucose reference range applies only to samples taken after fasting for at least 8 hours.  CBG monitoring, ED     Status: Abnormal   Collection Time: 05/17/21 12:41 AM  Result Value Ref Range   Glucose-Capillary 128 (H) 70 - 99 mg/dL    Comment: Glucose reference range applies only to samples taken after fasting for at least 8 hours.  Basic metabolic panel     Status: Abnormal   Collection Time: 05/17/21 12:50 AM  Result Value Ref Range   Sodium 137 135 - 145 mmol/L   Potassium 3.7 3.5 - 5.1 mmol/L    Comment: DELTA CHECK NOTED   Chloride 109 98 - 111 mmol/L   CO2 24 22 - 32 mmol/L   Glucose, Bld 141 (H) 70 - 99 mg/dL    Comment: Glucose reference range applies only to samples taken after fasting for at least 8 hours.   BUN 27 (H) 6 - 20 mg/dL   Creatinine, Ser 1.29 (H) 0.44 - 1.00 mg/dL   Calcium 8.3 (L) 8.9 - 10.3 mg/dL   GFR, Estimated 51 (L) >60 mL/min    Comment: (NOTE) Calculated using the CKD-EPI Creatinine Equation (2021)    Anion gap 4 (L) 5 - 15    Comment: Performed at [facility] [address], [place], [place] [address]  CBG monitoring, ED     Status: Abnormal   Collection Time: 05/17/21  1:45 AM  Result Value Ref Range   Glucose-Capillary 121 (H) 70 - 99 mg/dL    Comment: Glucose  reference range applies only to samples taken after fasting for at least 8 hours.  CBG monitoring, ED     Status: Abnormal   Collection Time: 05/17/21  2:47 AM  Result Value Ref Range   Glucose-Capillary 147 (H) 70 - 99 mg/dL    Comment: Glucose reference range applies only to samples taken after fasting for at least 8 hours.  CBG monitoring, ED     Status: Abnormal   Collection Time: 05/17/21  3:53 AM  Result Value Ref Range   Glucose-Capillary 134 (H) 70 - 99 mg/dL    Comment: Glucose reference range applies only to samples taken after fasting for at least 8 hours.  Beta-hydroxybutyric acid     Status: None   Collection Time: 05/17/21  4:35 AM  Result Value Ref Range   Beta-Hydroxybutyric  Acid 0.06 0.05 - 0.27 mmol/L    Comment: Performed at [facility], [place] [address], [place], [place] [address]  CBG monitoring, ED     Status: Abnormal   Collection Time: 05/17/21  4:41 AM  Result Value Ref Range   Glucose-Capillary 138 (H) 70 - 99 mg/dL    Comment: Glucose reference range applies only to samples taken after fasting for at least 8 hours.  CBG monitoring, ED     Status: Abnormal   Collection Time: 05/17/21  5:47 AM  Result Value Ref Range   Glucose-Capillary 149 (H) 70 - 99 mg/dL    Comment: Glucose reference range applies only to samples taken after fasting for at least 8 hours.  CBG monitoring, ED     Status: Abnormal   Collection Time: 05/17/21  7:20 AM  Result Value Ref Range   Glucose-Capillary 115 (H) 70 - 99 mg/dL    Comment: Glucose reference range applies only to samples taken after fasting for at least 8 hours.  CBG monitoring, ED     Status: Abnormal   Collection Time: 05/17/21  8:29 AM  Result Value Ref Range   Glucose-Capillary 133 (H) 70 - 99 mg/dL    Comment: Glucose reference range applies only to samples taken after fasting for at least 8 hours.  CBC     Status: Abnormal   Collection Time: 05/17/21  8:42 AM  Result Value Ref Range   WBC 4.8 4.0 - 10.5  K/uL   RBC 4.11 3.87 - 5.11 MIL/uL   Hemoglobin 10.9 (L) 12.0 - 15.0 g/dL   HCT 35.1 (L) 36.0 - 46.0 %   MCV 85.4 80.0 - 100.0 fL   MCH 26.5 26.0 - 34.0 pg   MCHC 31.1 30.0 - 36.0 g/dL   RDW 12.5 11.5 - 15.5 %   Platelets 136 (L) 150 - 400 K/uL   nRBC 0.0 0.0 - 0.2 %    Comment: Performed at [facility], [place] [address], [place], [place] [address]  Troponin I (High Sensitivity)     Status: Abnormal   Collection Time: 05/17/21  8:42 AM  Result Value Ref Range   Troponin I (High Sensitivity) 345 (HH) <18 ng/L    Comment: CRITICAL VALUE NOTED.  VALUE IS CONSISTENT WITH PREVIOUSLY REPORTED AND CALLED VALUE. (NOTE) Elevated high sensitivity troponin I (hsTnI) values and significant  changes across serial measurements may suggest ACS but many other  chronic and acute conditions are known to elevate hsTnI results.  Refer to the Links section for chest pain algorithms and additional  guidance. Performed at [facility], [place] [address], [place],  [address]   Basic metabolic panel     Status: Abnormal   Collection Time: 05/17/21  9:00 AM  Result Value Ref Range   Sodium 138 135 - 145 mmol/L   Potassium 4.2 3.5 - 5.1 mmol/L   Chloride 108 98 - 111 mmol/L   CO2 23 22 - 32 mmol/L   Glucose, Bld 155 (H) 70 - 99 mg/dL    Comment: Glucose reference range applies only to samples taken after fasting for at least 8 hours.   BUN 24 (H) 6 - 20 mg/dL   Creatinine, Ser 1.08 (H) 0.44 - 1.00 mg/dL   Calcium 8.9 8.9 - 10.3 mg/dL   GFR, Estimated >60 >60 mL/min    Comment: (NOTE) Calculated using the CKD-EPI Creatinine Equation (2021)    Anion gap 7 5 - 15  Comment: Performed at [facility], [place] [address], [place], [place] [address]  Troponin I (High Sensitivity)     Status: Abnormal   Collection Time: 05/17/21  1:23 PM  Result Value Ref Range   Troponin I (High Sensitivity) 311 (HH) <18 ng/L    Comment: CRITICAL VALUE NOTED.  VALUE IS CONSISTENT WITH PREVIOUSLY  REPORTED AND CALLED VALUE. (NOTE) Elevated high sensitivity troponin I (hsTnI) values and significant  changes across serial measurements may suggest ACS but many other  chronic and acute conditions are known to elevate hsTnI results.  Refer to the Links section for chest pain algorithms and additional  guidance. Performed at [facility], [place] [address] [place], [place] [address]   CBG monitoring, ED     Status: None   Collection Time: 05/17/21  1:26 PM  Result Value Ref Range   Glucose-Capillary 88 70 - 99 mg/dL    Comment: Glucose reference range applies only to samples taken after fasting for at least 8 hours.   DG Lumbar Spine 2-3 Views  Result Date: 05/17/2021 CLINICAL DATA:  Lower back pain. EXAM: LUMBAR SPINE - 2-3 VIEW COMPARISON:  September 28, 2008. FINDINGS: There is no evidence of lumbar spine fracture. Alignment is normal. Intervertebral disc spaces are maintained. IMPRESSION: Negative. Electronically Signed   By: Marijo Conception M.D.   On: 05/17/2021 09:43   CT Head Wo Contrast  Result Date: 05/16/2021 CLINICAL DATA:  Dizziness. EXAM: CT HEAD WITHOUT CONTRAST TECHNIQUE: Contiguous axial images were obtained from the base of the skull through the vertex without intravenous contrast. RADIATION DOSE REDUCTION: This exam was performed according to the departmental dose-optimization program which includes automated exposure control, adjustment of the mA and/or kV according to patient size and/or use of iterative reconstruction technique. COMPARISON:  Head CT and brain MRI 04/27/2021 FINDINGS: Brain: Exam is somewhat limited by motion despite attempting rescanning. Small left occipital brain lesion is again demonstrated. This is better evaluated on MRI examination no acute intracranial findings such as hemispheric infarction or intracranial hemorrhage. No extra-axial fluid collections are identified. Vascular: No hyperdense vessel or unexpected calcification. Skull: No skull fracture  or bone lesion. Sinuses/Orbits: The paranasal sinuses and mastoid air cells are clear. The globes are intact. Other: No scalp lesions or scalp hematoma. IMPRESSION: 1. Stable small left occipital brain lesion. 2. No acute intracranial findings. Electronically Signed   By: Marijo Sanes M.D.   On: 05/16/2021 15:57   DG Chest Port 1 View  Result Date: 05/16/2021 CLINICAL DATA:  Hyperglycemia. EXAM: PORTABLE CHEST 1 VIEW COMPARISON:  February 27, 2021. FINDINGS: The heart size and mediastinal contours are within normal limits. Both lungs are clear. The visualized skeletal structures are unremarkable. IMPRESSION: No active disease. Electronically Signed   By: Marijo Conception M.D.   On: 05/16/2021 13:26    Review Of Systems Constitutional: No fever, chills, weight loss or gain. Eyes: Left eye vision loss, wears glasses. No discharge or pain. Ears: No hearing loss, No tinnitus. Respiratory: No asthma, COPD, pneumonias. Positive shortness of breath. No hemoptysis. Cardiovascular: No chest pain, positive palpitations, no leg edema. Gastrointestinal: No nausea, vomiting, diarrhea, constipation. No GI bleed. No hepatitis. Genitourinary: No dysuria, hematuria, kidney stone. No incontinance. Neurological: Positive headache, no stroke, seizures.  Psychiatry: No psych facility admission for anxiety, depression, suicide. No detox. Skin: No rash. Musculoskeletal: No joint pain, fibromyalgia. No neck pain, back pain. Lymphadenopathy: No lymphadenopathy. Hematology: No anemia or easy bruising.  Blood pressure 131/88, pulse 90, temperature 98.4 F (36.9 C), temperature source Oral, resp. rate 20, height 5\' 6"  (1.676 m), weight 68.4 kg, last menstrual period 08/21/2016, SpO2 100 %. Body mass index is 24.34 kg/m. General appearance: alert, cooperative, appears stated age and no distress Head: Normocephalic, atraumatic. Eyes: Brown eyes, pink conjunctiva, Left cornea is hazy.  Neck: No adenopathy, no carotid  bruit, no JVD, supple, symmetrical, trachea midline and thyroid not enlarged. Resp: Clear to auscultation bilaterally. Cardio: Regular rate and rhythm, S1, S2 normal, II/VI systolic murmur, no click, rub or gallop GI: Soft, non-tender; bowel sounds normal; no organomegaly. Extremities: No edema, cyanosis or clubbing. Skin: Warm and dry.  Neurologic: Alert and oriented X 3, normal strength. Normal coordination.  Assessment/Plan Paroxysmal Atrial fibrillation, CHA2DS2VASc score of 3 Abnormal troponin I from demand ischemia HTN Type 2 DM with hyperglycemia and ketoacidosis HLD Left eye glaucoma with vision loss UTI  Plan: Add Toprol XL 50 mg. Daily. Cipro 250 mg. Bid x 5 days for UTI. Add Lisinopril 5 mg. Daily for diabetes mellitus and monitor renal function. May decrease amlodipine by 50 % if BP is lower with addition of B-blocker and ACE inhibitor. Treat medically for non-obstructive CAD. Consider diltiazem use (and DC amlodipine) if has additional episodes of atrial fibrillation with RVR.   Time spent: Review of old records, Lab, x-rays, EKG, other cardiac tests, examination, discussion with patient/Nurse/Doctor/Interpreter over 70 minutes.  Birdie Riddle, MD  05/17/2021, 5:22 PM

## 2021-05-17 NOTE — Progress Notes (Signed)
Inpatient Diabetes Program Recommendations  AACE/ADA: New Consensus Statement on Inpatient Glycemic Control (2015)  Target Ranges:  Prepandial:   less than 140 mg/dL      Peak postprandial:   less than 180 mg/dL (1-2 hours)      Critically ill patients:  140 - 180 mg/dL   Lab Results  Component Value Date   GLUCAP 133 (H) 05/17/2021   HGBA1C 9.2 (A) 05/03/2021    Review of Glycemic Control  Diabetes history: type 1 Outpatient Diabetes medications: levemir 14 units daily, Novolog 3 units TID with meals, Tradjenta Current orders for Inpatient glycemic control: Semglee 12 units daily, Novolog 3 units TID with meals  Inpatient Diabetes Program Recommendations:   Noted that patient has been on IV insulin drip. Recommend adding Novolog SENSITIVE (0-9 units) correction scale TID & Novolog 0-5 units HS scale to SQ insulin regimen. Novolog correction scale will need to be given as soon as IV insulin drip is stopped.   Harvel Ricks RN BSN CDE Diabetes Coordinator Pager: [phone]  8am-5pm

## 2021-05-17 NOTE — Progress Notes (Addendum)
HD#0 SUBJECTIVE:  Patient Summary: Jennifer [name] is a 51 y.o. with a pertinent PMH of uncontrolled insulin dependent type 2 DM, Atrial fibrillation on Eliquis, NSTEMI, glaucoma, HTN, HLD, CVA who presented with weakness and decreased PO intake and admitted for DKA.   Overnight Events: Endotool used overnight, troponin + into 300.  Interim History: Patient states that she is feeling better than yesterday and her nausea has improved too. Patient appears sleepy but is alert and oriented x3. She was not able to fully articulate her reason for presenting to the ED stating that she is sick. Patient would intermittently respond to questioning and reply in one word answer due to sleepiness.She states that he legs still feel weak. She needs a walker to walk at home and has home PT. She endorses falling 2x in the bathroom at home and has been having leg weakness since then. She has pain in her bilateral LE roughly around the knees. Denies dysuria, noted decreased urinary frequency on admission.  OBJECTIVE:  Vital Signs: Vitals:   05/17/21 0200 05/17/21 0300 05/17/21 0415 05/17/21 0430  BP: (!) 113/57 132/67 125/61 116/63  Pulse: 80 80  84  Resp: 16 15 16 16   Temp:      SpO2: 95% 97%  97%  Weight:      Height:       Supplemental O2: Room Air SpO2: 97 %  Filed Weights   05/16/21 1151  Weight: 68.4 kg     Intake/Output Summary (Last 24 hours) at 05/17/2021 0445 Last data filed at 05/17/2021 0043 Gross per 24 hour  Intake 1000 ml  Output --  Net 1000 ml   Net IO Since Admission: 1,000 mL [05/17/21 0445]  Physical Exam: Constitutional: ill-appearing, sitting in bed, in no acute distress HENT: normocephalic atraumatic, mucous membranes moist Eyes: conjunctiva non-erythematous Neck: supple Cardiovascular: regular rate and rhythm, no m/r/g Pulmonary/Chest: normal work of breathing on room air, lungs clear to auscultation bilaterally Abdominal: soft, non-tender, non-distended MSK:  normal bulk and tone Neurological: alert & oriented x 3, 3/5 strength in bilateral upper extremities, 2/5 strength in bilateral lower extremities Skin: warm and dry Psych: normal mood and affect   Patient Lines/Drains/Airways Status     Active Line/Drains/Airways     Name Placement date Placement time Site Days   Peripheral IV 05/16/21 24 G Anterior;Distal;Left Forearm 05/16/21  --  Forearm  1   Peripheral IV 05/16/21 20 G Left Antecubital 05/16/21  1328  Antecubital  1   Peripheral IV 05/16/21 20 G 1.88" Anterior;Right Forearm 05/16/21  2201  Forearm  1   External Urinary Catheter 05/16/21  1549  --  1   Incision (Closed) 05/05/20 Ankle Right 05/05/20  1051  -- 377            Pertinent Labs: CBC Latest Ref Rng & Units 05/16/2021 05/16/2021 04/27/2021  WBC 4.0 - 10.5 K/uL - 3.8(L) 4.0  Hemoglobin 12.0 - 15.0 g/dL 11.9(L) 13.2 11.4(L)  Hematocrit 36.0 - 46.0 % 35.0(L) 45.0 36.0  Platelets 150 - 400 K/uL - 132(L) 156    CMP Latest Ref Rng & Units 05/17/2021 05/16/2021 05/16/2021  Glucose 70 - 99 mg/dL 141(H) - 392(H)  BUN 6 - 20 mg/dL 27(H) - 25(H)  Creatinine 0.44 - 1.00 mg/dL 1.29(H) - 1.10(H)  Sodium 135 - 145 mmol/L 137 135 134(L)  Potassium 3.5 - 5.1 mmol/L 3.7 4.5 4.4  Chloride 98 - 111 mmol/L 109 - 103  CO2 22 - 32 mmol/L 24 -  19(L)  Calcium 8.9 - 10.3 mg/dL 8.3(L) - 8.9  Total Protein 6.5 - 8.1 g/dL - - -  Total Bilirubin 0.3 - 1.2 mg/dL - - -  Alkaline Phos 38 - 126 U/L - - -  AST 15 - 41 U/L - - -  ALT 0 - 44 U/L - - -    Recent Labs    05/17/21 0247 05/17/21 0353 05/17/21 0441  GLUCAP 147* 134* 138*     Pertinent Imaging: CT Head Wo Contrast  Result Date: 05/16/2021 CLINICAL DATA:  Dizziness. EXAM: CT HEAD WITHOUT CONTRAST TECHNIQUE: Contiguous axial images were obtained from the base of the skull through the vertex without intravenous contrast. RADIATION DOSE REDUCTION: This exam was performed according to the departmental dose-optimization program which  includes automated exposure control, adjustment of the mA and/or kV according to patient size and/or use of iterative reconstruction technique. COMPARISON:  Head CT and brain MRI 04/27/2021 FINDINGS: Brain: Exam is somewhat limited by motion despite attempting rescanning. Small left occipital brain lesion is again demonstrated. This is better evaluated on MRI examination no acute intracranial findings such as hemispheric infarction or intracranial hemorrhage. No extra-axial fluid collections are identified. Vascular: No hyperdense vessel or unexpected calcification. Skull: No skull fracture or bone lesion. Sinuses/Orbits: The paranasal sinuses and mastoid air cells are clear. The globes are intact. Other: No scalp lesions or scalp hematoma. IMPRESSION: 1. Stable small left occipital brain lesion. 2. No acute intracranial findings. Electronically Signed   By: Marijo Sanes M.D.   On: 05/16/2021 15:57   DG Chest Port 1 View  Result Date: 05/16/2021 CLINICAL DATA:  Hyperglycemia. EXAM: PORTABLE CHEST 1 VIEW COMPARISON:  February 27, 2021. FINDINGS: The heart size and mediastinal contours are within normal limits. Both lungs are clear. The visualized skeletal structures are unremarkable. IMPRESSION: No active disease. Electronically Signed   By: Marijo Conception M.D.   On: 05/16/2021 13:26    ASSESSMENT/PLAN:  Assessment: Principal Problem:   DKA (diabetic ketoacidosis) ([place]) Active Problems:   Type II diabetes mellitus ([place])   (HFpEF) heart failure with preserved ejection fraction ([facility])   PAF (paroxysmal atrial fibrillation) ([facility])   Jennifer [name] is a 51 y.o. with a pertinent PMH of uncontrolled type 2 dm, atrial fibrilaltion on Eliquis, left eye glaucoma, HTN, HLD, CVA, and recent dx of brain lesion, who presented with weakness and nausea and admitted for hyperglycemia with possible DKA.   Plan: Uncontrolled type 2 diabetes, hyperglycemia BHO mildly elevated at 1.40 with AG of 12. Endo tool was  started overnight.  AM labs demonstrated AG 4 and patient was transitioned off of endotool. Home insulin regimen resumed.  Family confirms that she has felt weak over the last few days. Home medications include levemir 14U daily, novolog 3 U WC, tradjenta 5 mg , and gabapentin 300 mg qhs. -Levemir 14U daily -Novolog 3 units WC -SSI sensitive  Elevated troponin Troponin initially at 13 then increased up to 345 overnight, and now downtrending to 311. No ST depression or elevations.  She presented in Afib with RVR and was given one dose of Cardizem then converted to sinus rhythm.  Prior history of NSTEMI in October 2022. 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 Unsure of etiology of elevated troponins, but with hx of NSTEMI will consult cardiology for further recommendations. Not on antiplatelet therapy due to treatment with Eliquis and history of GI bleed. -Consulted Dr. Doylene Canard, appreciate recommendations -  Beta blocker and ARB held following 11/22 hospitalization 2/2 to hypotension   Paroxysmal A fib. Presented with A fib with RVR and given cardizem. Unclear duration of AF w/ RVR, possible that this contributed to symptoms prior to presentation. In sinus rhythm. -Continued Eliquis  HTN Home medications include amlodipine.  HLD Home medications include rosuvastatin.  Hx of enhancing occipital lobe lesions MRI in 11/22 with enhancing lesion in left occipital lobe. On repeat imaging 01/23 showed decreased size of enhancing left occipital lesions with decreased edema. Findings thought to be consistent with resolving infarcts ir inflammation. Neoplasm unlikely.  Urinary retention History of urinary retention during prior admission, Foley catheter placed but since removed. -Bladder scan -Strict I/O  Best Practice: Diet: Carb modified IVF: none VTE: Eliquis 5 mg BID Code: Full AB: none Therapy Recs: Pending Family Contact:  daughter updated via phone DISPO: Anticipated discharge in 3-4 days to  home  pending Medical stability.  Signature: Daleen Bo. Jeramiah Mccaughey, D.O.  Internal Medicine Resident, PGY-1 [facility]  Pager: [phone] 4:45 AM, 05/17/2021   Please contact the on call pager after 5 pm and on weekends at [phone].

## 2021-05-17 NOTE — Progress Notes (Signed)
Pt arrived to rm 21 from ED. CHG wipe given. Initiated tele. Oriented pt to the unit. Call bell within reach.   Lavenia Atlas, RN

## 2021-05-18 DIAGNOSIS — R778 Other specified abnormalities of plasma proteins: Secondary | ICD-10-CM

## 2021-05-18 DIAGNOSIS — I248 Other forms of acute ischemic heart disease: Secondary | ICD-10-CM

## 2021-05-18 LAB — BASIC METABOLIC PANEL
Anion gap: 5 (ref 5–15)
BUN: 23 mg/dL — ABNORMAL HIGH (ref 6–20)
CO2: 23 mmol/L (ref 22–32)
Calcium: 8.7 mg/dL — ABNORMAL LOW (ref 8.9–10.3)
Chloride: 109 mmol/L (ref 98–111)
Creatinine, Ser: 0.97 mg/dL (ref 0.44–1.00)
GFR, Estimated: >60 mL/min (ref >60)
Glucose, Bld: 164 mg/dL — ABNORMAL HIGH (ref 70–99)
Potassium: 3.8 mmol/L (ref 3.5–5.1)
Sodium: 137 mmol/L (ref 135–145)

## 2021-05-18 LAB — GLUCOSE, CAPILLARY
Glucose-Capillary: 186 mg/dL — ABNORMAL HIGH (ref 70–99)
Glucose-Capillary: 260 mg/dL — ABNORMAL HIGH (ref 70–99)

## 2021-05-18 LAB — CBC
HCT: 31.2 % — ABNORMAL LOW (ref 36.0–46.0)
Hemoglobin: 9.6 g/dL — ABNORMAL LOW (ref 12.0–15.0)
MCH: 26.3 pg (ref 26.0–34.0)
MCHC: 30.8 g/dL (ref 30.0–36.0)
MCV: 85.5 fL (ref 80.0–100.0)
Platelets: 107 10*3/uL — ABNORMAL LOW (ref 150–400)
RBC: 3.65 MIL/uL — ABNORMAL LOW (ref 3.87–5.11)
RDW: 12.7 % (ref 11.5–15.5)
WBC: 4.8 10*3/uL (ref 4.0–10.5)
nRBC: 0 % (ref 0.0–0.2)

## 2021-05-18 MED ORDER — METOPROLOL SUCCINATE ER 50 MG PO TB24: 50.0000 mg | ORAL_TABLET | Freq: Every day | ORAL | 1 refills | Status: DC

## 2021-05-18 MED ORDER — INSULIN ASPART 100 UNIT/ML IJ SOLN
5.0000 [IU] | Freq: Three times a day (TID) | INTRAMUSCULAR | Status: DC
  Administered 2021-05-18: 5 [IU] via SUBCUTANEOUS

## 2021-05-18 MED ORDER — LISINOPRIL 5 MG PO TABS: 5.0000 mg | ORAL_TABLET | Freq: Every day | ORAL | 1 refills | Status: DC

## 2021-05-18 NOTE — Evaluation (Signed)
Physical Therapy Evaluation Patient Details Name: Jennifer [name] MRN: [id number] DOB: October 10, 1970 Today's Date: 05/18/2021  History of Present Illness  [name] presenting 1/25 with weakness, decreased PO intake and admitted for DKA   PMH: chronic diastolic heart failure, insulin-dependent type 2 diabetes, A. 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  Pt needing mod to max A of 2 persons preferably, thought not available today.  Pt may pose a difficult d/c in that she is more SNF appropriate, but usually discharges home to an unsafe environment for her.  Pt currently limited functionally due to the problems listed. ( See problems list.)   Pt will benefit from PT to maximize function and safety in order to get ready for next venue listed below.        Recommendations for follow up therapy are one component of a multi-disciplinary discharge planning process, led by the attending physician.  Recommendations may be updated based on patient status, additional functional criteria and insurance authorization.  Follow Up Recommendations Skilled nursing-short term rehab (<3 hours/day) (BUT if she chooses to got directly home with need maximal services.)    Assistance Recommended at Discharge Frequent or constant Supervision/Assistance (when not sleeping.)  Patient can return home with the following  A lot of help with walking and/or transfers;A lot of help with bathing/dressing/bathroom;Assistance with cooking/housework;Direct supervision/assist for medications management;Direct supervision/assist for financial management;Assist for transportation    Equipment Recommendations    Recommendations for Other Services       Functional Status Assessment Patient has had a recent decline in their functional status  and/or demonstrates limited ability to make significant improvements in function in a reasonable and predictable amount of time     Precautions / Restrictions Precautions Precautions: Fall      Mobility  Bed Mobility Overal bed mobility: Needs Assistance Bed Mobility: Supine to Sit, Sit to Supine     Supine to sit: Min assist Sit to supine: Min assist   General bed mobility comments: cues for direction, truncal assist up and R> L LE assist into bed.    Transfers Overall transfer level: Needs assistance Equipment used: Rolling walker (2 wheels) Transfers: Sit to/from Stand Sit to Stand: Mod assist           General transfer comment: cues for hand placement.  Assist forward and with boost    Ambulation/Gait Ambulation/Gait assistance: Max assist Gait Distance (Feet): 4 Feet Assistive device: Rolling walker (2 wheels) Gait Pattern/deviations: Step-to pattern   Gait velocity interpretation: <1.31 ft/sec, indicative of household ambulator   General Gait Details: very unsteady, uncoordinated steps, staggered and collapse at 4 feet from the bed.  Pt carried fully back to the bed.  Stairs            Wheelchair Mobility    Modified Rankin (Stroke Patients Only)       Balance                                             Pertinent Vitals/Pain Pain Assessment Pain Assessment: No/denies pain    Home Living Family/patient expects to be discharged to:: Private residence Living Arrangements: Spouse/significant other;Children Available Help at Discharge: Family;Available PRN/intermittently Type of  Home: Apartment Home Access: Level entry       Home Layout: One level Home Equipment: Conservation officer, nature (2 wheels);Wheelchair - manual;BSC/3in1      Prior Function Prior Level of Function : Needs assist             Mobility Comments: using RW for mobility ADLs Comments: reports independent ADLs, family assist with IADLs     Hand  Dominance   Dominant Hand: Right    Extremity/Trunk Assessment   Upper Extremity Assessment Upper Extremity Assessment: Defer to OT evaluation    Lower Extremity Assessment Lower Extremity Assessment: Generalized weakness (bil d/f 2/5, quads 3/5, hams 2+, hip flexors 2+)    Cervical / Trunk Assessment Cervical / Trunk Assessment: Kyphotic  Communication   Communication: Prefers language other than English (ahmed, [id number])  Cognition Arousal/Alertness: Awake/alert Behavior During Therapy: Flat affect Overall Cognitive Status: No family/caregiver present to determine baseline cognitive functioning                                          General Comments General comments (skin integrity, edema, etc.): vss overall.    Exercises     Assessment/Plan    PT Assessment Patient needs continued PT services  PT Problem List Decreased strength;Decreased activity tolerance;Decreased balance;Decreased mobility;Decreased coordination;Decreased knowledge of use of DME       PT Treatment Interventions DME instruction;Gait training;Functional mobility training;Therapeutic activities;Balance training;Patient/family education    PT Goals (Current goals can be found in the Care Plan section)  Acute Rehab PT Goals Patient Stated Goal: did not state any goals. PT Goal Formulation: Patient unable to participate in goal setting Time For Goal Achievement: 06/01/21 Potential to Achieve Goals: Fair    Frequency Min 3X/week     Co-evaluation               AM-PAC PT "6 Clicks" Mobility  Outcome Measure Help needed turning from your back to your side while in a flat bed without using bedrails?: A Little Help needed moving from lying on your back to sitting on the side of a flat bed without using bedrails?: A Little Help needed moving to and from a bed to a chair (including a wheelchair)?: Total Help needed standing up from a chair using your arms (e.g., wheelchair or  bedside chair)?: A Lot Help needed to walk in hospital room?: Total Help needed climbing 3-5 steps with a railing? : Total 6 Click Score: 11    End of Session   Activity Tolerance: Patient limited by fatigue Patient left: in bed;with call bell/phone within reach;Other (comment) (With OT) Nurse Communication: Mobility status PT Visit Diagnosis: Unsteadiness on feet (R26.81);Other abnormalities of gait and mobility (R26.89);Muscle weakness (generalized) (M62.81);Difficulty in walking, not elsewhere classified (R26.2)    Time: 1125-1228 PT Time Calculation (min) (ACUTE ONLY): 63 min   Charges:   PT Evaluation $PT Eval High Complexity: 1 High PT Treatments $Gait Training: 8-22 mins $Therapeutic Activity: 23-37 mins        05/18/2021  Ginger Carne., PT Acute Rehabilitation Services [phone]  (pager) [phone]  (office)  Tessie Fass Kylil Swopes 05/18/2021, 12:58 PM

## 2021-05-18 NOTE — Evaluation (Addendum)
Occupational Therapy Evaluation Patient Details Name: Jennifer [name] MRN: [id number] DOB: [birth year] Today's Date: 05/18/2021   History of Present Illness [name] presenting 1/25 with weakness, decreased PO intake and admitted for DKA   PMH: chronic diastolic heart failure, insulin-dependent type 2 diabetes, A. 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  Admitted for above and limited by problem list below, including impaired cognition, decreased activity tolerance, impaired balance, impaired vision, and generalized weakness.  Patient requires min-mod assist for bed mobility and up to total assist for LB ADLs.  Transfers deferred today, as pt fatigued from PT session right before OT.  Utilized video interpreter during session. Patient was disoriented to time, follows simple commands with increased time and repetition from interpreter and demonstrates poor awareness, safety and problem solving. Based on performance today, patient will benefit from continued OT services acutely and after dc at SNF level to optimize independence and safety with ADLs, mobility.  IF pt decides to dc home, she will need max HH services and recommend 24/7 assist.      Recommendations for follow up therapy are one component of a multi-disciplinary discharge planning process, led by the attending physician.  Recommendations may be updated based on patient status, additional functional criteria and insurance authorization.   Follow Up Recommendations  Skilled nursing-short term rehab (<3 hours/day) (max HH services if she goes home)    Assistance Recommended at Discharge Frequent or constant Supervision/Assistance  Patient can return home  with the following A lot of help with walking and/or transfers;A lot of help with bathing/dressing/bathroom;Assistance with cooking/housework;Direct supervision/assist for medications management;Direct supervision/assist for financial management;Assist for transportation;Help with stairs or ramp for entrance    Functional Status Assessment  Patient has had a recent decline in their functional status and demonstrates the ability to make significant improvements in function in a reasonable and predictable amount of time.  Equipment Recommendations  BSC/3in1;Wheelchair (measurements OT);Wheelchair cushion (measurements OT)    Recommendations for Other Services       Precautions / Restrictions Precautions Precautions: Fall Restrictions Weight Bearing Restrictions: No      Mobility Bed Mobility Overal bed mobility: Needs Assistance Bed Mobility: Supine to Sit, Sit to Supine     Supine to sit: Min assist Sit to supine: Mod assist   General bed mobility comments: min assist to ascend trunk and mod assist to control trunk back to supine    Transfers                   General transfer comment: deferred      Balance Overall balance assessment: Needs assistance Sitting-balance support: No upper extremity supported, Feet supported Sitting balance-Leahy Scale: Fair Sitting balance - Comments: fair at EOB with supervision until fatigued and requires up to min assist       Standing balance comment: deferred due to fatigue                           ADL either performed or assessed with clinical judgement   ADL Overall ADL's : Needs assistance/impaired     Grooming: Minimal assistance;Sitting           Upper Body Dressing :  Minimal assistance;Sitting   Lower Body Dressing: Sitting/lateral leans;Maximal assistance     Toilet Transfer Details (indicate cue type and reason): deferred due to safety/fatigue           General ADL Comments: pt sat EOB  for approx 8 minutes, laid back without warning and required mod assist to guide head towards [facility]     Vision Baseline Vision/History: 3 Glaucoma Ability to See in Adequate Light: 1 Impaired Patient Visual Report: Other (comment) (reports her "left eye is not good") Additional Comments: continue assesment, hx of glaucoma- anticipate low vision at baseline but pt unable to report clearly     Perception     Praxis      Pertinent Vitals/Pain Pain Assessment Pain Assessment: No/denies pain     Hand Dominance Right   Extremity/Trunk Assessment Upper Extremity Assessment Upper Extremity Assessment: Generalized weakness   Lower Extremity Assessment Lower Extremity Assessment: Defer to PT evaluation   Cervical / Trunk Assessment Cervical / Trunk Assessment: Kyphotic   Communication Communication Communication: Prefers language other than English (ahmed, [id number])   Cognition Arousal/Alertness: Awake/alert Behavior During Therapy: Flat affect Overall Cognitive Status: No family/caregiver present to determine baseline cognitive functioning Area of Impairment: Orientation, Following commands, Awareness, Problem solving, Safety/judgement                 Orientation Level: Disoriented to, Time     Following Commands: Follows one step commands consistently, Follows one step commands with increased time Safety/Judgement: Decreased awareness of safety, Decreased awareness of deficits Awareness: Emergent Problem Solving: Slow processing, Decreased initiation, Difficulty sequencing, Requires verbal cues General Comments: unable to report month, when given choices voices March.  Follows simple commands with increased time, but poor problem solving, awareness and safety     General Comments  VSS    Exercises     Shoulder Instructions      Home Living Family/patient expects to be discharged to:: Private residence Living Arrangements: Spouse/significant  other;Children Available Help at Discharge: Family;Available PRN/intermittently Type of Home: Apartment Home Access: Level entry     Home Layout: One level     Bathroom Shower/Tub: Teacher, early years/pre: Handicapped height     Home Equipment: Conservation officer, nature (2 wheels);Wheelchair - manual;BSC/3in1          Prior Functioning/Environment Prior Level of Function : Needs assist             Mobility Comments: using RW for mobility ADLs Comments: reports independent ADLs, family assist with IADLs        OT Problem List: Decreased strength;Decreased range of motion;Decreased safety awareness;Obesity;Decreased activity tolerance;Impaired balance (sitting and/or standing);Decreased cognition;Decreased knowledge of use of DME or AE;Decreased knowledge of precautions;Impaired vision/perception      OT Treatment/Interventions: Self-care/ADL training;Therapeutic exercise;DME and/or AE instruction;Therapeutic activities;Balance training;Patient/family education;Cognitive remediation/compensation;Visual/perceptual remediation/compensation    OT Goals(Current goals can be found in the care plan section) Acute Rehab OT Goals Patient Stated Goal: none stated OT Goal Formulation: Patient unable to participate in goal setting Time For Goal Achievement: 06/01/21 Potential to Achieve Goals: Fair  OT Frequency: Min 2X/week    Co-evaluation              AM-PAC OT "6 Clicks" Daily Activity     Outcome Measure Help from another person eating meals?: A Little Help from another person taking care of personal grooming?: A Little Help from another person toileting, which includes using toliet, bedpan, or urinal?: Total Help from another person bathing (including  washing, rinsing, drying)?: A Lot Help from another person to put on and taking off regular upper body clothing?: A Little Help from another person to put on and taking off regular lower body clothing?: Total 6 Click  Score: 13   End of Session Nurse Communication: Mobility status  Activity Tolerance: Patient limited by fatigue Patient left: in bed;with call bell/phone within reach;with bed alarm set  OT Visit Diagnosis: Other abnormalities of gait and mobility (R26.89);Muscle weakness (generalized) (M62.81);Other symptoms and signs involving cognitive function                Time: 0347-4259 OT Time Calculation (min): 22 min Charges:  OT General Charges $OT Visit: 1 Visit OT Evaluation $OT Eval Moderate Complexity: 1 Mod  Jennifer [name], OT Acute Rehabilitation Services Pager [phone] Office [phone]   Jennifer [name] 05/18/2021, 1:34 PM

## 2021-05-18 NOTE — Consult Note (Signed)
Ref: Jennifer Persia, MD   Subjective:  No chest pain or palpitation. Monitor: Sinus rhythm. Blood sugar control is sub optimal. Renal function improved post hydration. Family concerned about lack of ambulation (Chronic)  Objective:  Vital Signs in the last 24 hours: Temp:  [98 F (36.7 C)-98.4 F (36.9 C)] 98.4 F (36.9 C) (01/27 0741) Pulse Rate:  [75-94] 78 (01/27 0741) Cardiac Rhythm: Normal sinus rhythm (01/27 0822) Resp:  [13-20] 15 (01/27 0741) BP: (112-151)/(52-99) 151/78 (01/27 0741) SpO2:  [93 %-100 %] 100 % (01/27 0741)  Physical Exam: BP Readings from Last 1 Encounters:  05/18/21 (!) 151/78     Wt Readings from Last 1 Encounters:  05/16/21 68.4 kg    Weight change:  Body mass index is 24.34 kg/m. HEENT: [place]/AT, Eyes-Brown, Conjunctiva-Pink, Sclera-Non-icteric Neck: No JVD, No bruit, Trachea midline. Lungs:  Clear, Bilateral. Cardiac:  Regular rhythm, normal S1 and S2, no S3. II/VI systolic murmur. Abdomen:  Soft, non-tender. BS present. Extremities:  Mild bilateral ankle swelling present. No cyanosis. No clubbing. CNS: AxOx3, Cranial nerves grossly intact, moves all 4 extremities.  Skin: Warm and dry.   Intake/Output from previous day: 01/26 0701 - 01/27 0700 In: 269.8 [P.O.:240; I.V.:29.8] Out: 800 [Urine:800]    Lab Results: BMET    Component Value Date/Time   NA 137 05/18/2021 0215   NA 138 05/17/2021 0900   NA 137 05/17/2021 0050   NA 140 05/03/2021 1541   NA 138 02/09/2021 1137   NA 139 04/19/2020 1555   K 3.8 05/18/2021 0215   K 4.2 05/17/2021 0900   K 3.7 05/17/2021 0050   CL 109 05/18/2021 0215   CL 108 05/17/2021 0900   CL 109 05/17/2021 0050   CO2 23 05/18/2021 0215   CO2 23 05/17/2021 0900   CO2 24 05/17/2021 0050   GLUCOSE 164 (H) 05/18/2021 0215   GLUCOSE 155 (H) 05/17/2021 0900   GLUCOSE 141 (H) 05/17/2021 0050   BUN 23 (H) 05/18/2021 0215   BUN 24 (H) 05/17/2021 0900   BUN 27 (H) 05/17/2021 0050   BUN 18 05/03/2021 1541    BUN 14 02/09/2021 1137   BUN 14 04/19/2020 1555   CREATININE 0.97 05/18/2021 0215   CREATININE 1.08 (H) 05/17/2021 0900   CREATININE 1.29 (H) 05/17/2021 0050   CREATININE 0.63 10/28/2014 1649   CREATININE 0.53 02/18/2014 1653   CREATININE 0.61 07/07/2012 1601   CALCIUM 8.7 (L) 05/18/2021 0215   CALCIUM 8.9 05/17/2021 0900   CALCIUM 8.3 (L) 05/17/2021 0050   GFRNONAA >60 05/18/2021 0215   GFRNONAA >60 05/17/2021 0900   GFRNONAA 51 (L) 05/17/2021 0050   GFRNONAA >89 10/28/2014 1649   GFRNONAA >89 02/18/2014 1653   GFRNONAA >89 07/07/2012 1601   GFRAA 75 04/19/2020 1555   GFRAA 85 03/27/2020 1443   GFRAA 88 03/03/2019 1533   GFRAA >89 10/28/2014 1649   GFRAA >89 02/18/2014 1653   GFRAA >89 07/07/2012 1601   CBC    Component Value Date/Time   WBC 4.8 05/18/2021 0215   RBC 3.65 (L) 05/18/2021 0215   HGB 9.6 (L) 05/18/2021 0215   HGB 10.0 (L) 02/09/2021 1137   HCT 31.2 (L) 05/18/2021 0215   HCT 31.1 (L) 02/09/2021 1137   PLT 107 (L) 05/18/2021 0215   PLT 153 02/09/2021 1137   MCV 85.5 05/18/2021 0215   MCV 86 02/09/2021 1137   MCH 26.3 05/18/2021 0215   MCHC 30.8 05/18/2021 0215   RDW 12.7 05/18/2021 0215  RDW 11.9 02/09/2021 1137   LYMPHSABS 1.9 04/27/2021 0534   LYMPHSABS 1.6 02/09/2021 1137   MONOABS 0.2 04/27/2021 0534   EOSABS 0.2 04/27/2021 0534   EOSABS 0.2 02/09/2021 1137   BASOSABS 0.0 04/27/2021 0534   BASOSABS 0.0 02/09/2021 1137   HEPATIC Function Panel Recent Labs    04/02/21 0645 04/27/21 0534 05/16/21 1431  PROT 6.3* 7.2 6.9   HEMOGLOBIN A1C No components found for: HGA1C,  MPG CARDIAC ENZYMES Lab Results  Component Value Date   CKTOTAL 15 (L) 03/01/2021   BNP No results for input(s): PROBNP in the last 8760 hours. TSH Recent Labs    11/25/20 1212 02/04/21 0215 05/16/21 1431  TSH 3.115 1.284 1.287   CHOLESTEROL Recent Labs    02/04/21 0215  CHOL 212*    Scheduled Meds:  apixaban  5 mg Oral BID   gabapentin  300 mg Oral  QHS   insulin aspart  0-5 Units Subcutaneous QHS   insulin aspart  0-9 Units Subcutaneous TID WC   insulin aspart  5 Units Subcutaneous TID WC   insulin detemir  12 Units Subcutaneous Daily   lisinopril  5 mg Oral Daily   metoprolol succinate  50 mg Oral Daily   rosuvastatin  20 mg Oral Daily   Continuous Infusions:  PRN Meds:.acetaminophen **OR** acetaminophen, dextrose  Assessment/Plan:  Paroxysmal atrial fibrillation Abnormal troponin I from demand ischemia HTN Type 2 DM with hyperglycemia HLD UTI S/P Left eye glaucoma with vision loss Abnormal gait, chronic  Plan: Continue medical treatment. Increased activity as tolerated. F/U in 1 week in office.   LOS: 1 day   Time spent including chart review, lab review, examination, discussion with patient/Family/Nurse : 30 min   Dixie Dials  MD  05/18/2021, 10:17 AM

## 2021-05-18 NOTE — TOC Transition Note (Signed)
Transition of Care (TOC) - CM/SW Discharge Note Marvetta Gibbons RN, BSN Transitions of Care Unit 4E- RN Case Manager See Treatment Team for direct phone #    Patient Details  Name: Jennifer [name] MRN: [id number] Date of Birth: 05-12-1970  Transition of Care [facility]) CM/SW Contact:  Dawayne Patricia, RN Phone Number: 05/18/2021, 3:43 PM   Clinical Narrative:    Pt stable for transition home today, pt/family have refused SNF placement and prefer to take pt home with [facility]. Orders placed for HHPT/OT- per son he was under the impression that [facility] was set up when pt d/c'd from rehab- per chart review- noted that [facility] was unable to be secured due to no agency would accept.  Calls made to [facility] agencies- and still no one able to accept for [facility] needs at this time.   Per CSW note son agreeable to outpt therapy-  Referral made to outpt Neuro rehab  Bedside RN has spoken with son who states they will plan to transport home, and no DME needs- pt has RW, shower chair, and wheelchair at home.    Final next level of care: OP Rehab Barriers to Discharge: No [facility] will accept this patient   Patient Goals and CMS Choice Patient states their goals for this hospitalization and ongoing recovery are:: return home CMS Medicare.gov Compare Post Acute Care list provided to:: Patient Represenative (must comment) (son) Choice offered to / list presented to : Adult Children  Discharge Placement               Home        Discharge Plan and Services In-house Referral: Clinical Social Work Discharge Planning Services: CM Consult Post Acute Care Choice: Home Health          DME Arranged: 3-N-1, Wheelchair manual, Patient refused services (son reports they have DME) DME Agency: NA       HH Arranged: PT, OT          Social Determinants of Health (SDOH) Interventions     Readmission Risk Interventions Readmission Risk Prevention Plan 05/18/2021 04/12/2020  Transportation Screening Complete  Complete  PCP or Specialist Appt within 3-5 Days - Complete  HRI or [place] - Complete  Social Work Consult for [place] Planning/Counseling - Complete  Palliative Care Screening - Not Applicable  Medication Review Press photographer) Complete Complete  PCP or Specialist appointment within 3-5 days of discharge Complete -  [place] or [place] Complete -  SW Recovery Care/Counseling Consult Complete -  Meta Patient Refused -  Some recent data might be hidden

## 2021-05-18 NOTE — Discharge Summary (Signed)
Name: Jennifer [name] MRN: [id number] DOB: 24-Jul-1970 51 y.o. PCP: Jose Persia, MD  Date of Admission: 05/16/2021 11:41 AM Date of Discharge: 05/18/21 Attending Physician: Charise Killian, MD  Discharge Diagnosis: 1.  Uncontrolled type 2 diabetes with hyperglycemia 2.  Demand ischemia in setting of paroxysmal atrial fibrillation  Chronic: HTN HLD  Discharge Medications: Allergies as of 05/18/2021   No Known Allergies      Medication List     STOP taking these medications    amLODipine 10 MG tablet Commonly known as: NORVASC   amLODipine 2.5 MG tablet Commonly known as: NORVASC       TAKE these medications    acetaminophen 325 MG tablet Commonly known as: TYLENOL Take 2 tablets (650 mg total) by mouth every 6 (six) hours as needed for mild pain (or Fever >/= 101).   apixaban 5 MG Tabs tablet Commonly known as: ELIQUIS Take 1 tablet (5 mg total) by mouth 2 (two) times daily.   brimonidine 0.2 % ophthalmic solution Commonly known as: ALPHAGAN Place 1 drop into the left eye 3 (three) times daily. What changed: how to take this   calcium citrate-vitamin D 315-200 MG-UNIT tablet Commonly known as: CITRACAL+D Take 2 tablets by mouth 2 (two) times daily.   dorzolamide-timolol 22.3-6.8 MG/ML ophthalmic solution Commonly known as: COSOPT Place 1 drop into the left eye 2 (two) times daily.   gabapentin 300 MG capsule Commonly known as: Neurontin Take 1 capsule (300 mg total) by mouth at bedtime. What changed: when to take this   insulin detemir 100 UNIT/ML FlexPen Commonly known as: LEVEMIR Inject 14 Units into the skin at bedtime.   linagliptin 5 MG Tabs tablet Commonly known as: TRADJENTA Take 1 tablet (5 mg total) by mouth daily.   lisinopril 5 MG tablet Commonly known as: ZESTRIL Take 1 tablet (5 mg total) by mouth daily. Start taking on: May 19, 2021   magnesium gluconate 500 MG tablet Commonly known as: MAGONATE Take 0.5 tablets (250 mg  total) by mouth at bedtime.   metoprolol succinate 50 MG 24 hr tablet Commonly known as: TOPROL-XL Take 1 tablet (50 mg total) by mouth daily. Take with or immediately following a meal. Start taking on: May 19, 2021   multivitamin with minerals Tabs tablet Take 1 tablet by mouth daily.   NovoLOG FlexPen 100 UNIT/ML FlexPen Generic drug: insulin aspart Inject 3 Units into the skin 3 (three) times daily with meals.   rosuvastatin 20 MG tablet Commonly known as: Crestor Take 1 tablet (20 mg total) by mouth daily.               Durable Medical Equipment  (From admission, onward)           Start     Ordered   05/18/21 1355  DME 3-in-1  Once        05/18/21 1359   05/18/21 1355  DME standard manual wheelchair with seat cushion  Once       Comments: Patient suffers uncontrolled diabetes and failure to thrive which impairs her ability to perform daily activities like bathing, dressing, feeding, grooming, and toileting in the home.  A walker will not resolve issue with performing activities of daily living. A wheelchair will allow patient to safely perform daily activities. Patient can safely propel the wheelchair in the home or has a caregiver who can provide assistance. Length of need 6 months . Accessories: elevating leg rests (ELRs), wheel locks, extensions and anti-tippers.  05/18/21 1359            Disposition and follow-up:   Jennifer [name] was discharged from [facility] in [place] condition.  At the hospital follow up visit please address:  Uncontrolled type 2 diabetes with hyperglycemia: Patient did not take her insulin.  Continued Levemir 14 units daily, NovoLog 3 units 3 times daily with meals, and linagliptin at discharge.  Demand ischemia in setting of paroxysmal atrial fibrillation Cardiology evaluated with recommendations of discontinuing amlodipine, then starting lisinopril 5 mg and toprol XL 50 mg.  Labs / imaging needed at time of  follow-up: none  Pending labs/ test needing follow-up: none  Follow-up Appointments:  Follow-up Information     Dixie Dials, MD Follow up in 1 week(s).   Specialty: Cardiology Contact information: [place] [address] [phone]         [place] Follow up.   Specialty: Rehabilitation Why: referral for outpt PT/OT - they will contact you to schedule- if you have not heard from them within 7 days- reach out to them to check on referral. Contact information: [address] [place] [id number] [place] by problem list: 1. Uncontrolled type 2 diabetes, hyperglycemia BHO mildly elevated at 1.40 with AG of 12. Endo tool was started overnight.  AM labs demonstrated AG 4 and patient was transitioned off of endotool. Home insulin regimen resumed.  Family confirms that she has felt weak over the last few days. Home medications include levemir 14U daily, novolog 3 U WC, tradjenta 5 mg , and gabapentin 300 mg qhs.  Continue these medications at discharge. 2. Demand ishcemia in setting of paroxysmal atrial fibrillation Troponin initially at 13 then increased up to 345 , peaked there. No ST depression or elevations.  She presented in Afib with RVR and was given one dose of Cardizem then converted to sinus rhythm.  She will have follow up with him in one week. prior history of NSTEMI in October 2022. 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 Expected demand ischemia in setting of atrial fibrillation,  but with hx of NSTEMI we consulted cardiology for further recommendations.  Patient's cardiologist was consulted, Dr. Doylene Canard, agreed was likely demand ischemia .Not on antiplatelet therapy due to treatment with Eliquis and history of GI bleed.  Subjective: Patient endorses improvement today.  She has improved dizziness and mentation.   Discharge Exam:   BP 106/68 (BP Location: Left Arm)    Pulse 71    Temp 98.8 F (37.1 C) (Oral)    Resp 16    Ht 5\' 6"  (1.676 m)    Wt 68.4 kg    LMP 08/21/2016 (Exact Date)    SpO2 98%    BMI 24.34 kg/m  Constitutional: ill-appearing, sitting in bed, in no acute distress HENT: normocephalic atraumatic, mucous membranes moist Eyes: conjunctiva non-erythematous Neck: supple Cardiovascular: regular rate and rhythm, no m/r/g Pulmonary/Chest: normal work of breathing on room air, lungs clear to auscultation bilaterally Abdominal: soft, non-tender, non-distended MSK: normal bulk and tone Neurological: alert & oriented x 3, 5/5 strength in bilateral upper extremities, 4/5 strength in bilateral lower extremities Skin: warm and dry Psych: normal mood and affect    Pertinent Labs, Studies, and Procedures:  CBC Latest Ref Rng & Units 05/18/2021 05/17/2021 05/16/2021  WBC 4.0 - 10.5 K/uL 4.8 4.8 -  Hemoglobin 12.0 - 15.0 g/dL 9.6(L) 10.9(L) 11.9(L)  Hematocrit 36.0 - 46.0 % 31.2(L) 35.1(L) 35.0(L)  Platelets 150 - 400 K/uL 107(L) 136(L) -   CMP Latest Ref Rng & Units 05/18/2021 05/17/2021 05/17/2021  Glucose 70 - 99 mg/dL 164(H) 155(H) 141(H)  BUN 6 - 20 mg/dL 23(H) 24(H) 27(H)  Creatinine 0.44 - 1.00 mg/dL 0.97 1.08(H) 1.29(H)  Sodium 135 - 145 mmol/L 137 138 137  Potassium 3.5 - 5.1 mmol/L 3.8 4.2 3.7  Chloride 98 - 111 mmol/L 109 108 109  CO2 22 - 32 mmol/L 23 23 24   Calcium 8.9 - 10.3 mg/dL 8.7(L) 8.9 8.3(L)  Total Protein 6.5 - 8.1 g/dL - - -  Total Bilirubin 0.3 - 1.2 mg/dL - - -  Alkaline Phos 38 - 126 U/L - - -  AST 15 - 41 U/L - - -  ALT 0 - 44 U/L - - -     Discharge Instructions: Discharge Instructions     Diet - low sodium heart healthy   Complete by: As directed    Discharge instructions   Complete by: As directed    [name]  It was a pleasure taking care of you at [facility] [place] were admitted for diabetic  keto acidosis and treated. We are discharging you home now that you are doing better. Please follow the following instructions.  1)Take your diabetic regimen daily as prescribed 2)Work with physical therapy and occupational therapy to regain your strength.   Increase activity slowly   Complete by: As directed        Signed: Christiana Fuchs, DO 05/18/2021, 3:37 PM   Pager: [phone]

## 2021-05-18 NOTE — TOC Initial Note (Signed)
Transition of Care [facility]) - Initial/Assessment Note    Patient Details  Name: Jennifer [name] MRN: [id number] Date of Birth: 1971/02/21  Transition of Care [facility]) CM/SW Contact:    Vinie Sill, LCSW Phone Number: 05/18/2021, 1:33 PM  Clinical Narrative:                  Called patient's son,Abdel- CSW introduced self and explained role. CSW discussed short term rehab at [facility]. He states preference is for the patient to return home. CSW inquired about 24/7 supervision- he reports, someone can be home with the patient 24/7-during the day and night. He states patient was set up with HH/PT but no one came out to the home. If patient unable to get [facility], he states family can get patient to outpatient therapy.   TOC will continue to follow and assist with discharge planning.  Thurmond Butts, MSW, LCSW Clinical Social Worker    Expected Discharge Plan: [place]     Patient Goals and CMS Choice        Expected Discharge Plan and Services Expected Discharge Plan: [place]                                              Prior Living Arrangements/Services                       Activities of Daily Living      Permission Sought/Granted                  Emotional Assessment       Orientation: : Oriented to Self, Oriented to Place, Oriented to  Time, Oriented to Situation Alcohol / Substance Use: Not Applicable Psych Involvement: No (comment)  Admission diagnosis:  Weakness [R53.1] DKA (diabetic ketoacidosis) ([place]) [E11.10] Hyperglycemia [R73.9] Elevated troponin [R77.8] AKI (acute kidney injury) ([place]) [N17.9] Atrial fibrillation with RVR (HCC) [I48.91] Patient Active Problem List   Diagnosis Date Noted   DKA (diabetic ketoacidosis) ([place]) 05/16/2021   History of stroke 05/05/2021   Labile blood glucose    Uncontrolled type 2 diabetes mellitus with hyperglycemia ([place])    Acute blood loss anemia    Debility  03/17/2021   Glaucoma 03/06/2021   At risk for medication noncompliance 03/06/2021   Physical deconditioning 01/05/2021   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 ([place]) 11/29/2020   Orthostatic hypotension 11/22/2020   Lower extremity weakness 11/22/2020   CKD (chronic kidney disease) stage 3, GFR 30-59 ml/min ([place]) 11/22/2020   Depression 08/22/2020   Weight loss, non-intentional 07/04/2020   Adult failure to thrive 07/04/2020   Hyperglycemia 06/15/2020   Early satiety 05/31/2020   PAF (paroxysmal atrial fibrillation) ([place]) 09/20/2018   Migraine    (HFpEF) heart failure with preserved ejection fraction ([place]) 01/01/2018   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   Pseudophakia of right eye 09/16/2017   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   Fatigue 04/02/2016   Uveitic glaucoma of left eye, 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   Vitamin D deficiency 10/30/2014   Diabetic neuropathy ([place]) 05/13/2013   Healthcare maintenance 02/26/2011   Hyperlipidemia 05/05/2006   Type II diabetes mellitus ([place]) 04/22/1998   PCP:  Jose Persia, MD Pharmacy:   [id number] - [place], [place] - [address]  [place] [address] Phone: [phone] Fax: [phone]     Social Determinants of Health (SDOH) Interventions    Readmission Risk Interventions Readmission Risk Prevention Plan 04/12/2020  Transportation Screening Complete  PCP or Specialist Appt within 3-5 Days Complete  HRI or [place] Complete  Social Work Consult for [place] Planning/Counseling Complete  Palliative Care Screening Not Applicable  Medication Review Press photographer) Complete  Some recent data might be hidden

## 2021-05-24 ENCOUNTER — Other Ambulatory Visit: Payer: Self-pay | Admitting: Obstetrics and Gynecology

## 2021-05-24 ENCOUNTER — Other Ambulatory Visit: Payer: Self-pay

## 2021-05-24 NOTE — Patient Instructions (Signed)
Visit Information  Jennifer [name] / Jennifer [name] was given information about Medicaid Managed Care team care coordination services as a part of their [place] Medicaid benefit. Jennifer [name]  verbally consented to engagement with the [facility] Managed Care team.   If you are experiencing a medical emergency, please call 911 or report to your local emergency department or urgent care.   If you have a non-emergency medical problem during routine business hours, please contact your provider's office and ask to speak with a nurse.   For questions related to your [facility], please call: [phone] or visit the homepage here: [email or web address]  If you would like to schedule transportation through your [facility], please call the following number at least 2 days in advance of your appointment: [phone].   Call the [place] at [phone], at any time, 24 hours a day, 7 days a week. If you are in danger or need immediate medical attention call 911.  If you would like help to quit smoking, call 1-800-QUIT-NOW 773 120 1086) OR Espaol: 1-855-Djelo-Ya (4-132-440-1027) o para ms informacin haga clic aqu or Text READY to 200-400 to register via text  Jennifer [name] / Jennifer [name] - following are the goals we discussed in your visit today:   Goals Addressed             This Visit's Progress    Protect My Health       Timeframe:  Long-Range Goal Priority:  High Start Date:       08/29/20                      Expected End Date:  ongoing           Follow Up Date:  06/21/21   - schedule appointment for flu shot - schedule appointment for vaccines needed due to my age or health - schedule recommended health tests (blood work, mammogram, colonoscopy, pap test) - schedule and keep appointment for annual check-up   05/24/21:  Hospital follow up appointment  scheduled for 05/29/21.  Patient's daughter to schedule  PT appt-has phone number.   Why is this important?   Screening tests can find diseases early when they are easier to treat.  Your doctor or nurse will talk with you about which tests are important for you.  Getting shots for common diseases like the flu and shingles will help prevent them.     The patient / Jennifer [name] verbalized understanding of instructions provided today and declined a print copy of patient instruction materials.   The Managed Medicaid care management team will reach out to the patient / Jennifer [name] again over the next 30 days.  The  [employer] (Jennifer [name]) has been provided with contact information for the Managed Medicaid care management team and has been advised to call with any health related questions or concerns.   Jennifer [name], BSN    [employer] - Managed Medicaid High Risk [phone].   Following is a copy of your plan of care:  Care Plan : General Plan of Care (Adult)  Updates made by Jennifer Medicus, [name] since 05/24/2021 12:00 AM     Problem: Health Promotion or Disease Self-Management (General Plan of Care)   Priority: High  Onset Date: 08/29/2020     Long-Range Goal: Self-Management Plan Developed   Start Date: 08/29/2020  Expected End Date: 06/23/2021  Recent Progress: Not on  track  Priority: High  Note:   Current Barriers:  Ineffective Self Health Maintenance Patient with diabetes, hypertension, deconditioning, depression Currently UNABLE TO independently self manage needs related to chronic health conditions.  Knowledge Deficits related to short term plan for care coordination needs and long term plans for chronic disease management needs 05/24/21:  Patient hospitalized 05/16/21-05/18/21 for DM and Afib.  Has appt 05/29/21 for hospital follow up.  Patient's daughter to call and schedule PT appt-has phone number.  Patient's daughter states blood sugars are  between 200 and 400-encouraged patient's daughter to contact provider. Nurse Case Manager Clinical Goal(s):  patient will work with care management team to address care coordination and chronic disease management needs related to Disease Management Educational Needs Care Coordination Medication Management and Education Medication Reconciliation Medication Assistance  Psychosocial Support Mental Health Counseling   Interventions:  Evaluation of current treatment plan and patient's adherence to plan as established by provider. Reviewed medications with patient/patient's daughter. Collaborated with Pharmacy Pharmacy referral for medication review. Collaborated with Social Work regarding depression.  Referral for financial resources-needs rent assistance. Discussed plans with patient for ongoing care management follow up and provided patient with direct contact information for care management team Reviewed scheduled/upcoming provider appointments.  Social Work referral for depression. Patient's daughter given [facility] outpatient PT phone number to call for an appointment. Collaborated with PCP regarding blood sugars, PT. Self Care Activities:  Patient will self administer medications as prescribed Patient will attend all scheduled provider appointments Patient/DPRwill call pharmacy for medication refills Patient/Jennifer [name] will attend church or other social activities Patient/Jennifer [name] will continue to perform ADL's independently Patient/Jennifer [name] will call provider office for new concerns or questions Patient will work Social Work to address care coordination needs and will continue to work with the clinical team to address health care and disease management related needs.   Patient Goals: In the next 30 days, patient will attend all provider appointments. In the next 30 days, patient will take medications as directed. In the next 30 days, patient/patient's daughter  will speak with  Pharmacist.  Follow Up Plan: [name] has been provided with contact information for the care management team and has been advised to call with any health related questions or concerns.  The care management team will reach out to the patient/patient's daughter.again over the next 30 days.    Evidence-based guidance:   Review biopsychosocial determinants of health screens.  Review need for preventive screening based on age, sex, family history and health history.  Determine level of modifiable health risk.  Discuss identified risks.  Identify areas where behavior change may lead to improved health.  Promote healthy lifestyle.  Evoke change talk using open-ended questions, pros and cons, as well as looking forward.  Identify and manage conditions or preconditions to reduce health risk.  Implement additional goals and interventions based on identified risk factors.

## 2021-05-24 NOTE — Patient Outreach (Signed)
Medicaid Managed Care   Nurse Care Manager Note  05/24/2021 Name:  Jennifer [name] MRN:  [id number] DOB:  [birth year]  Jennifer [name] is an 51 y.o. year old female who is a primary patient of Jennifer Persia, MD.  The [facility] Managed Care Coordination team was consulted for assistance with:    Chronic healthcare management needs  [name] /[name] was given information about Medicaid Managed Care Coordination team services today. [name] ([name]) agreed to services and verbal consent obtained.  Engaged with patient/[name] by telephone for follow up visit in response to provider referral for case management and/or care coordination services.   Assessments/Interventions:  Review of past medical history, allergies, medications, health status, including review of consultants reports, laboratory and other test data, was performed as part of comprehensive evaluation and provision of chronic care management services.  SDOH (Social Determinants of Health) assessments and interventions performed: SDOH Interventions    Flowsheet Row Most Recent Value  SDOH Interventions   Housing Interventions Intervention Not Indicated  Physical Activity Interventions Other (Comments)  [patient starting PT]      Care Plan  No Known Allergies  Medications Reviewed Today     Reviewed by [name], RN (Registered Nurse) on 05/24/21 at 1248  Med List Status: <None>   Medication Order Taking? Sig Documenting Provider Last Dose Status Informant  acetaminophen (TYLENOL) 325 MG tablet [id number] No Take 2 tablets (650 mg total) by mouth every 6 (six) hours as needed for mild pain (or Fever >/= 101). Cathlyn Parsons, PA-C 05/13/2021 Active Child, Self  apixaban (ELIQUIS) 5 MG TABS tablet [id number] No Take 1 tablet (5 mg total) by mouth 2 (two) times daily. Cathlyn Parsons, PA-C unk last dose unk time Active Child, Self  brimonidine (ALPHAGAN) 0.2 % ophthalmic solution [id number] No  Place 1 drop into the left eye 3 (three) times daily.  Patient taking differently: Place 1 drop into both eyes 3 (three) times daily.   Cathlyn Parsons, PA-C 05/15/2021 Active Child, Self  calcium citrate-vitamin D (CITRACAL+D) 315-200 MG-UNIT tablet [id number] No Take 2 tablets by mouth 2 (two) times daily.  Patient not taking: Reported on 04/27/2021   Cathlyn Parsons, PA-C Not Taking Active Child, Self  Patient not taking:  Discontinued 09/27/20 0116   Patient not taking:  Discontinued 09/27/20 0116   dorzolamide-timolol (COSOPT) 22.3-6.8 MG/ML ophthalmic solution [id number] No Place 1 drop into the left eye 2 (two) times daily. Cathlyn Parsons, PA-C 05/15/2021 Active Child, Self  gabapentin (NEURONTIN) 300 MG capsule [id number] No Take 1 capsule (300 mg total) by mouth at bedtime.  Patient taking differently: Take 300 mg by mouth 3 (three) times daily.   Angiulli, Lavon Paganini, PA-C unk last dose Active Child, Self  insulin aspart (NOVOLOG FLEXPEN) 100 UNIT/ML FlexPen [id number] No Inject 3 Units into the skin 3 (three) times daily with meals. Meredith Staggers, MD 05/15/2021 Active Child, Self  insulin detemir (LEVEMIR) 100 UNIT/ML FlexPen [id number] No Inject 14 Units into the skin at bedtime. Meredith Staggers, MD 05/15/2021 Active Child, Self           Med Note Broadus John, REGEENA   Wed May 16, 2021  7:42 PM)    linagliptin (TRADJENTA) 5 MG TABS tablet [id number] No Take 1 tablet (5 mg total) by mouth daily. Cathlyn Parsons, PA-C unk last dose Expired 05/16/21 2359   lisinopril (ZESTRIL) 5 MG tablet [id number]  Take 1 tablet (5 mg total) by mouth  daily. Madalyn Rob, MD  Active   magnesium gluconate (MAGONATE) 500 MG tablet [id number] No Take 0.5 tablets (250 mg total) by mouth at bedtime.  Patient not taking: Reported on 05/17/2021   Cathlyn Parsons, PA-C Not Taking Active Child, Self  metoprolol succinate (TOPROL-XL) 50 MG 24 hr tablet [id number]  Take 1 tablet (50 mg total) by mouth daily.  Take with or immediately following a meal. Madalyn Rob, MD  Active   Multiple Vitamin (MULTIVITAMIN WITH MINERALS) TABS tablet [id number] No Take 1 tablet by mouth daily.  Patient not taking: Reported on 04/27/2021   Debbe Odea, MD Not Taking Active Child, Self  rosuvastatin (CRESTOR) 20 MG tablet [id number] No Take 1 tablet (20 mg total) by mouth daily.  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 ([place]) 11/29/2020   Orthostatic hypotension 11/22/2020   Lower extremity weakness 11/22/2020   CKD (chronic kidney disease) stage 3, GFR 30-59 ml/min ([place]) 11/22/2020   Depression 08/22/2020   Weight loss, non-intentional 07/04/2020   Adult failure to thrive 07/04/2020   Hyperglycemia 06/15/2020   Early satiety 05/31/2020   PAF (paroxysmal atrial fibrillation) ([place]) 09/20/2018   Migraine    (HFpEF) heart failure with preserved ejection fraction ([place]) 01/01/2018   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   Pseudophakia of right eye 09/16/2017   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   Fatigue 04/02/2016   Uveitic glaucoma of left eye, 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   Vitamin D deficiency 10/30/2014   Diabetic neuropathy ([place]) 05/13/2013   Healthcare maintenance 02/26/2011   Hyperlipidemia 05/05/2006   Type II diabetes mellitus ([place]) 04/22/1998    Conditions to be addressed/monitored per PCP order:    chronic healthcare management needs, DM, HTN, migraines, depression, HLD, vision loss left eye, deconditioning, Afib,   Care Plan : General Plan of Care (Adult)  Updates made by [name], RN since 05/24/2021 12:00 AM     Problem: Health Promotion or Disease Self-Management (General Plan of Care)   Priority: High  Onset Date: 08/29/2020     Long-Range Goal: Self-Management Plan Developed   Start Date: 08/29/2020  Expected End Date: 06/23/2021  Recent Progress: Not on track  Priority: High  Note:   Current Barriers:  Ineffective Self Health Maintenance Patient with diabetes, hypertension, deconditioning, depression Currently UNABLE TO independently self manage needs related to chronic health conditions.  Knowledge Deficits related to short term plan for care coordination needs and long term plans for chronic disease management needs 05/24/21:  Patient hospitalized 05/16/21-05/18/21 for DM and Afib.  Has appt 05/29/21 for hospital follow up.  Patient's daughter to call and schedule PT appt-has phone number.  Patient's daughter states blood sugars are between 200 and 400-encouraged patient's daughter to contact provider. Nurse Case Manager Clinical Goal(s):  patient will work with care management team to address care  coordination and chronic disease management needs related to Disease Management Educational Needs Care Coordination Medication Management and Education Medication Reconciliation Medication Assistance  Psychosocial Support Mental Health Counseling   Interventions:  Evaluation of current treatment plan and patient's adherence to plan as established by provider. Reviewed medications with patient/patient's daughter. Collaborated with Pharmacy Pharmacy referral for medication review. Collaborated with Social Work regarding depression.  Referral for financial resources-needs rent assistance. Discussed plans with patient for ongoing care management follow up and provided patient  with direct contact information for care management team Reviewed scheduled/upcoming provider appointments.  Social Work referral for depression. Patient's daughter given [facility] outpatient PT phone number to call for an appointment. Collaborated with PCP regarding blood sugars, PT. Self Care Activities:  Patient will self administer medications as prescribed Patient will attend all scheduled provider appointments Patient/DPRwill call pharmacy for medication refills Patient/[name] will attend church or other social activities Patient/[name] will continue to perform ADL's independently Patient/[name] will call provider office for new concerns or questions Patient will work Social Work to address care coordination needs and will continue to work with the clinical team to address health care and disease management related needs.   Patient Goals: In the next 30 days, patient will attend all provider appointments. In the next 30 days, patient will take medications as directed. In the next 30 days, patient/patient's daughter  will speak with Pharmacist.  Follow Up Plan: [name] has been provided with contact information for the care management team and has been advised to call with any health related questions or concerns.  The care management team will reach out to the patient/patient's daughter.again over the next 30 days.    Evidence-based guidance:   Review biopsychosocial determinants of health screens.  Review need for preventive screening based on age, sex, family history and health history.  Determine level of modifiable health risk.  Discuss identified risks.  Identify areas where behavior change may lead to improved health.  Promote healthy lifestyle.  Evoke change talk using open-ended questions, pros and cons, as well as looking forward.  Identify and manage conditions or preconditions to reduce health risk.  Implement additional goals and interventions based on  identified risk factors.     Follow Up:  Patient /[name] agrees to Care Plan and Follow-up.  Plan: The Managed Medicaid care management team will reach out to the patient / [name] again over the next 30 days. and The  [employer] ([name]) has been provided with contact information for the Managed Medicaid care management team and has been advised to call with any health related questions or concerns.  Date/time of next scheduled RN care management/care coordination outreach:  06/21/21 at 1230.

## 2021-05-25 ENCOUNTER — Ambulatory Visit: Payer: Self-pay

## 2021-05-25 ENCOUNTER — Telehealth: Payer: Self-pay | Admitting: Pharmacist

## 2021-05-25 NOTE — Patient Outreach (Signed)
05/25/2021 Name: Jennifer [name] MRN: [id number] DOB: 30-Oct-1970  Referred by: Jose Persia, MD Reason for referral : No chief complaint on file.   An unsuccessful telephone outreach was attempted today. The patient was referred to the case management team for assistance with care management and care coordination.    Follow Up Plan: The Managed Medicaid care management team will reach out to the patient again over the next 10 days.   Hughes Better PharmD, CPP High Risk Managed Medicaid [place] [phone]

## 2021-05-28 ENCOUNTER — Telehealth: Payer: Self-pay

## 2021-05-28 NOTE — Telephone Encounter (Signed)
Juliann Pulse from [facility] sent a fax stating the patient is in need of a shower chair with a back and a bedside commode. Please advise.

## 2021-05-29 ENCOUNTER — Encounter: Payer: Self-pay | Admitting: Physical Medicine and Rehabilitation

## 2021-05-29 ENCOUNTER — Other Ambulatory Visit: Payer: Self-pay

## 2021-05-29 ENCOUNTER — Encounter
Payer: Medicaid Other | Attending: Physical Medicine and Rehabilitation | Admitting: Physical Medicine and Rehabilitation

## 2021-05-29 ENCOUNTER — Telehealth: Payer: Self-pay | Admitting: *Deleted

## 2021-05-29 DIAGNOSIS — F32A Depression, unspecified: Secondary | ICD-10-CM | POA: Diagnosis not present

## 2021-05-29 DIAGNOSIS — I63419 Cerebral infarction due to embolism of unspecified middle cerebral artery: Secondary | ICD-10-CM | POA: Diagnosis not present

## 2021-05-29 DIAGNOSIS — R739 Hyperglycemia, unspecified: Secondary | ICD-10-CM

## 2021-05-29 DIAGNOSIS — Z8673 Personal history of transient ischemic attack (TIA), and cerebral infarction without residual deficits: Secondary | ICD-10-CM

## 2021-05-29 DIAGNOSIS — E1110 Type 2 diabetes mellitus with ketoacidosis without coma: Secondary | ICD-10-CM

## 2021-05-29 MED ORDER — INSULIN DETEMIR 100 UNIT/ML FLEXPEN: 15.0000 [IU] | PEN_INJECTOR | Freq: Every day | SUBCUTANEOUS | 11 refills | Status: DC

## 2021-05-29 NOTE — Progress Notes (Signed)
° °  Subjective:    Patient ID: [name], female    DOB: Nov 28, 1970, 51 y.o.   MRN: [id number]  HPI An audio/video tele-health visit is felt to be the most appropriate encounter for this patient at this time. This is a follow up tele-visit via phone. The patient is at home. MD is at office. Prior to scheduling this appointment, our staff discussed the limitations of evaluation and management by telemedicine and the availability of in-person appointments. The patient expressed understanding and agreed to proceed.   Mrs. Crutcher is a 51 year old woman who presents for hospital follow-up after CIR admission after stroke.  1) stroke -she has not received any home therapy.  -spoke with both her daughter and son -needs home eqiupment  2) Uncontrolled type 2 DM: -CBGs have been up to 506, have been low at times as well.  -on Novolog 3U and Levemir 14 as well as Tradjenta   3) Depression: -her daughter is worried about this.     Review of Systems +depression, high blood sugars    Objective:   Physical Exam Not performed as patient seen via phone       Assessment & Plan:   Mrs. Biermann is a 51 year old woman admitted to CIR with CVA  1) CVA -prescribed outpatient PT and OT -placed order for shower chair with a back and bedside commode  2) uncontrolled type 2 DM -sent script for Levemir 15U -asked daughter to call me regularly with updates regarding CBGs -advised son regarding goal CBGs  10 minutes spent in discussion of the following: patient's lack of home therapy, sending referrals for outpatient therapy, sending referrals for DME equipment, CBGs, increasing Levemir, depression, goals to increase fruits/vegetables/nuts

## 2021-05-29 NOTE — Telephone Encounter (Signed)
Opened in error

## 2021-05-31 ENCOUNTER — Encounter: Payer: Medicaid Other | Admitting: Student

## 2021-06-01 ENCOUNTER — Encounter: Payer: Medicaid Other | Admitting: Student

## 2021-06-04 ENCOUNTER — Other Ambulatory Visit: Payer: Self-pay

## 2021-06-04 ENCOUNTER — Emergency Department (HOSPITAL_COMMUNITY)
Admission: EM | Admit: 2021-06-04 | Discharge: 2021-06-05 | Disposition: A | Discharge status: Home / Self Care | Payer: Medicaid Other | Attending: Emergency Medicine | Admitting: Emergency Medicine

## 2021-06-04 ENCOUNTER — Ambulatory Visit: Payer: Medicaid Other | Attending: Internal Medicine | Admitting: Physical Therapy

## 2021-06-04 ENCOUNTER — Emergency Department (HOSPITAL_COMMUNITY): Payer: Medicaid Other

## 2021-06-04 ENCOUNTER — Encounter (HOSPITAL_COMMUNITY): Payer: Self-pay

## 2021-06-04 DIAGNOSIS — Z79899 Other long term (current) drug therapy: Secondary | ICD-10-CM | POA: Insufficient documentation

## 2021-06-04 DIAGNOSIS — Z794 Long term (current) use of insulin: Secondary | ICD-10-CM | POA: Insufficient documentation

## 2021-06-04 DIAGNOSIS — E1165 Type 2 diabetes mellitus with hyperglycemia: Secondary | ICD-10-CM | POA: Insufficient documentation

## 2021-06-04 DIAGNOSIS — R531 Weakness: Secondary | ICD-10-CM | POA: Diagnosis not present

## 2021-06-04 DIAGNOSIS — Z86011 Personal history of benign neoplasm of the brain: Secondary | ICD-10-CM | POA: Insufficient documentation

## 2021-06-04 DIAGNOSIS — Z7901 Long term (current) use of anticoagulants: Secondary | ICD-10-CM | POA: Insufficient documentation

## 2021-06-04 DIAGNOSIS — Z7984 Long term (current) use of oral hypoglycemic drugs: Secondary | ICD-10-CM | POA: Insufficient documentation

## 2021-06-04 DIAGNOSIS — I10 Essential (primary) hypertension: Secondary | ICD-10-CM | POA: Insufficient documentation

## 2021-06-04 DIAGNOSIS — Z20822 Contact with and (suspected) exposure to covid-19: Secondary | ICD-10-CM | POA: Insufficient documentation

## 2021-06-04 DIAGNOSIS — R739 Hyperglycemia, unspecified: Secondary | ICD-10-CM

## 2021-06-04 DIAGNOSIS — R410 Disorientation, unspecified: Secondary | ICD-10-CM | POA: Diagnosis present

## 2021-06-04 DIAGNOSIS — R4182 Altered mental status, unspecified: Secondary | ICD-10-CM | POA: Diagnosis not present

## 2021-06-04 LAB — I-STAT BETA HCG BLOOD, ED (MC, WL, AP ONLY): I-stat hCG, quantitative: <5 m[IU]/mL (ref <5)

## 2021-06-04 LAB — PROTIME-INR
INR: 0.9 (ref 0.8–1.2)
Prothrombin Time: 12.2 s (ref 11.4–15.2)

## 2021-06-04 LAB — BASIC METABOLIC PANEL
Anion gap: 11 (ref 5–15)
BUN: 31 mg/dL — ABNORMAL HIGH (ref 6–20)
CO2: 24 mmol/L (ref 22–32)
Calcium: 9.9 mg/dL (ref 8.9–10.3)
Chloride: 100 mmol/L (ref 98–111)
Creatinine, Ser: 1.25 mg/dL — ABNORMAL HIGH (ref 0.44–1.00)
GFR, Estimated: 53 mL/min — ABNORMAL LOW (ref >60)
Glucose, Bld: 321 mg/dL — ABNORMAL HIGH (ref 70–99)
Potassium: 4.2 mmol/L (ref 3.5–5.1)
Sodium: 135 mmol/L (ref 135–145)

## 2021-06-04 LAB — I-STAT CHEM 8, ED
BUN: 43 mg/dL — ABNORMAL HIGH (ref 6–20)
BUN: 48 mg/dL — ABNORMAL HIGH (ref 6–20)
Calcium, Ion: 1.15 mmol/L (ref 1.15–1.40)
Calcium, Ion: 1.12 mmol/L — ABNORMAL LOW (ref 1.15–1.40)
Chloride: 102 mmol/L (ref 98–111)
Chloride: 102 mmol/L (ref 98–111)
Creatinine, Ser: 1.2 mg/dL — ABNORMAL HIGH (ref 0.44–1.00)
Creatinine, Ser: 1.2 mg/dL — ABNORMAL HIGH (ref 0.44–1.00)
Glucose, Bld: 322 mg/dL — ABNORMAL HIGH (ref 70–99)
Glucose, Bld: 318 mg/dL — ABNORMAL HIGH (ref 70–99)
HCT: 38 % (ref 36.0–46.0)
HCT: 39 % (ref 36.0–46.0)
Hemoglobin: 12.9 g/dL (ref 12.0–15.0)
Hemoglobin: 13.3 g/dL (ref 12.0–15.0)
Potassium: 5.2 mmol/L — ABNORMAL HIGH (ref 3.5–5.1)
Potassium: 6.2 mmol/L — ABNORMAL HIGH (ref 3.5–5.1)
Sodium: 133 mmol/L — ABNORMAL LOW (ref 135–145)
Sodium: 132 mmol/L — ABNORMAL LOW (ref 135–145)
TCO2: 29 mmol/L (ref 22–32)
TCO2: 29 mmol/L (ref 22–32)

## 2021-06-04 LAB — RAPID URINE DRUG SCREEN, HOSP PERFORMED
Amphetamines: NOT DETECTED
Barbiturates: NOT DETECTED
Benzodiazepines: NOT DETECTED
Cocaine: NOT DETECTED
Opiates: NOT DETECTED
Tetrahydrocannabinol: NOT DETECTED

## 2021-06-04 LAB — HEPATIC FUNCTION PANEL
ALT: 14 U/L (ref 0–44)
AST: 26 U/L (ref 15–41)
Albumin: 3.3 g/dL — ABNORMAL LOW (ref 3.5–5.0)
Alkaline Phosphatase: 82 U/L (ref 38–126)
Bilirubin, Direct: 0.3 mg/dL — ABNORMAL HIGH (ref 0.0–0.2)
Indirect Bilirubin: 0.8 mg/dL (ref 0.3–0.9)
Total Bilirubin: 1.1 mg/dL (ref 0.3–1.2)
Total Protein: 7.3 g/dL (ref 6.5–8.1)

## 2021-06-04 LAB — CBC
HCT: 39 % (ref 36.0–46.0)
Hemoglobin: 12.7 g/dL (ref 12.0–15.0)
MCH: 26.9 pg (ref 26.0–34.0)
MCHC: 32.6 g/dL (ref 30.0–36.0)
MCV: 82.6 fL (ref 80.0–100.0)
Platelets: 172 10*3/uL (ref 150–400)
RBC: 4.72 MIL/uL (ref 3.87–5.11)
RDW: 12.2 % (ref 11.5–15.5)
WBC: 4.2 10*3/uL (ref 4.0–10.5)
nRBC: 0 % (ref 0.0–0.2)

## 2021-06-04 LAB — I-STAT ARTERIAL BLOOD GAS, ED
Acid-Base Excess: 2 mmol/L (ref 0.0–2.0)
Bicarbonate: 27.7 mmol/L (ref 20.0–28.0)
Calcium, Ion: 1.35 mmol/L (ref 1.15–1.40)
HCT: 36 % (ref 36.0–46.0)
Hemoglobin: 12.2 g/dL (ref 12.0–15.0)
O2 Saturation: 97 %
Patient temperature: 97.7
Potassium: 4.3 mmol/L (ref 3.5–5.1)
Sodium: 133 mmol/L — ABNORMAL LOW (ref 135–145)
TCO2: 29 mmol/L (ref 22–32)
pCO2 arterial: 43.8 mmHg (ref 32.0–48.0)
pH, Arterial: 7.406 (ref 7.350–7.450)
pO2, Arterial: 87 mmHg (ref 83.0–108.0)

## 2021-06-04 LAB — DIFFERENTIAL
Abs Immature Granulocytes: 0.01 10*3/uL (ref 0.00–0.07)
Basophils Absolute: 0 10*3/uL (ref 0.0–0.1)
Basophils Relative: 1 %
Eosinophils Absolute: 0.1 10*3/uL (ref 0.0–0.5)
Eosinophils Relative: 2 %
Immature Granulocytes: 0 %
Lymphocytes Relative: 52 %
Lymphs Abs: 2.2 10*3/uL (ref 0.7–4.0)
Monocytes Absolute: 0.3 10*3/uL (ref 0.1–1.0)
Monocytes Relative: 6 %
Neutro Abs: 1.6 10*3/uL — ABNORMAL LOW (ref 1.7–7.7)
Neutrophils Relative %: 39 %

## 2021-06-04 LAB — URINALYSIS, ROUTINE W REFLEX MICROSCOPIC
Bilirubin Urine: NEGATIVE
Glucose, UA: >=500 mg/dL — AB
Ketones, ur: NEGATIVE mg/dL
Nitrite: NEGATIVE
Protein, ur: 100 mg/dL — AB
Specific Gravity, Urine: 1.014 (ref 1.005–1.030)
pH: 5 (ref 5.0–8.0)

## 2021-06-04 LAB — RESP PANEL BY RT-PCR (FLU A&B, COVID) ARPGX2
Influenza A by PCR: NEGATIVE
Influenza B by PCR: NEGATIVE
SARS Coronavirus 2 by RT PCR: NEGATIVE

## 2021-06-04 LAB — CBG MONITORING, ED
Glucose-Capillary: 269 mg/dL — ABNORMAL HIGH (ref 70–99)
Glucose-Capillary: 289 mg/dL — ABNORMAL HIGH (ref 70–99)

## 2021-06-04 LAB — AMMONIA
Ammonia: 117 umol/L — ABNORMAL HIGH (ref 9–35)
Ammonia: 13 umol/L (ref 9–35)

## 2021-06-04 LAB — LIPASE, BLOOD: Lipase: 27 U/L (ref 11–51)

## 2021-06-04 LAB — ETHANOL: Alcohol, Ethyl (B): <10 mg/dL (ref <10)

## 2021-06-04 LAB — APTT: aPTT: 27 s (ref 24–36)

## 2021-06-04 MED ORDER — INSULIN ASPART 100 UNIT/ML IJ SOLN
10.0000 [IU] | Freq: Once | INTRAMUSCULAR | Status: AC
  Administered 2021-06-04: 10 [IU] via INTRAVENOUS

## 2021-06-04 MED ORDER — SODIUM CHLORIDE 0.9 % IV SOLN: INTRAVENOUS | Status: DC

## 2021-06-04 MED ORDER — PROCHLORPERAZINE EDISYLATE 10 MG/2ML IJ SOLN
10.0000 mg | Freq: Once | INTRAMUSCULAR | Status: DC
Start: 2021-06-04 — End: 2021-06-05

## 2021-06-04 MED ORDER — SODIUM CHLORIDE 0.9 % IV BOLUS: 500.0000 mL | Freq: Once | INTRAVENOUS | Status: DC

## 2021-06-04 MED ORDER — SODIUM CHLORIDE 0.9 % IV SOLN: 100.0000 mL/h | INTRAVENOUS | Status: DC

## 2021-06-04 MED ORDER — SODIUM CHLORIDE 0.9 % IV BOLUS
1000.0000 mL | Freq: Once | INTRAVENOUS | Status: AC
Start: 2021-06-04 — End: 2021-06-04
  Administered 2021-06-04: 1000 mL via INTRAVENOUS

## 2021-06-04 NOTE — ED Provider Notes (Signed)
I assumed care from Dr. Rogene Houston at 4 PM.  Patient brought in by EMS after her son reported for the last 2 weeks with complaints of generalized weakness and not feeling well.  Initially per physician's report patient was altered upon arrival and having difficulty communicating with the interpreter in her own language.  She initially had an ammonia that was elevated however with repeat evaluation the ammonia level was normal.  She had an MRI that did not show acute findings for new stroke or blood products.  She had normal renal function and electrolytes today.  No acute findings on any of her evaluation.  I went back in the room at 10:35 PM once all results have returned and use the Arabic interpreter and patient is awake and alert and oriented.  She is able to tell me all of her history.  She reports that she has been having headaches since leaving the hospital at the end of January but in the last day or 2 the headaches have become more severe.  She reports she took some Tylenol and got some sleep and feels that the headache is improved.  She has been having difficulty ambulating since being home and is using a walker but does admit that she does not get up a lot because of the fall she had before her last hospitalization.  She currently is denying any localized weakness, numbness, nausea vomiting or fever.  At this time with her being alert and oriented and at baseline do not feel that she meets criteria for admission.  Attempted to call the patient's son and daughter and nobody is answering the phone for any collateral information.  Unclear if possibly patient had a seizure at home and a post ictal episode but no prior history of seizures and no seizure activity here.  She does have follow-up with neurology and is seen in the outpatient setting by physical medicine.  Patient did not have any concerns about going home today.  If we are able to contact her son feel that it is reasonable for her to be discharged.   However due to the inability to get of hold of her son at this time transition of care team was contacted to help ensuring that patient can be safely discharged to home.   Blanchie Dessert, MD 06/04/21 2237

## 2021-06-04 NOTE — ED Provider Notes (Signed)
[facility] EMERGENCY DEPARTMENT Provider Note   CSN: [id number] Arrival date & time: 06/04/21  1059     History  No chief complaint on file.   Jennifer [name] is a 51 y.o. female.  Patient brought in by EMS.  Supposedly patient has had generalized weakness not feeling well for about 2 weeks.  Did use an interpreter.  Interpreter can included that even in her native language of Arabic that patient seems to be confused.  Patient has a very complex past medical history.  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  Patient was readmitted November 15th for rectal bleeding.  Patient seen at the [facility] emergency department on January 6 for persistent headache.  And patient seen again January 25 at [facility], ED with an admission for acute kidney injury.  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  And the history of the acute lower GI bleed.      Home Medications Prior to Admission medications   Medication Sig Start Date End Date Taking? Authorizing Provider  acetaminophen (TYLENOL) 325 MG tablet Take 2 tablets (650 mg total) by mouth every 6 (six) hours as needed for mild pain (or Fever >/= 101). 04/03/21   Angiulli, Lavon Paganini, PA-C  apixaban (ELIQUIS) 5 MG TABS tablet Take 1 tablet (5 mg total) by mouth 2 (two) times daily. 04/03/21   Angiulli, Lavon Paganini, PA-C  brimonidine (ALPHAGAN) 0.2 % ophthalmic solution Place 1 drop into the left eye 3 (three) times daily. Patient taking differently: Place 1 drop into both eyes 3 (three) times daily. 04/03/21   Angiulli, Lavon Paganini, PA-C  calcium citrate-vitamin D (CITRACAL+D)  315-200 MG-UNIT tablet Take 2 tablets by mouth 2 (two) times daily. Patient not taking: Reported on 04/27/2021 04/03/21 04/03/22  Angiulli, Lavon Paganini, PA-C  dorzolamide-timolol (COSOPT) 22.3-6.8 MG/ML ophthalmic solution Place 1 drop into the left eye 2 (two) times daily. 04/03/21   Angiulli, Lavon Paganini, PA-C  gabapentin (NEURONTIN) 300 MG capsule Take 1 capsule (300 mg total) by mouth at bedtime. Patient taking differently: Take 300 mg by mouth 3 (three) times daily. 04/03/21 04/03/22  Angiulli, Lavon Paganini, PA-C  insulin aspart (NOVOLOG FLEXPEN) 100 UNIT/ML FlexPen Inject 3 Units into the skin 3 (three) times daily with meals. 04/14/21   Meredith Staggers, MD  insulin detemir (LEVEMIR) 100 UNIT/ML FlexPen Inject 15 Units into the skin at bedtime. 05/29/21   Raulkar, Clide Deutscher, MD  linagliptin (TRADJENTA) 5 MG TABS tablet Take 1 tablet (5 mg total) by mouth daily. 04/03/21 05/16/21  Angiulli, Lavon Paganini, PA-C  lisinopril (ZESTRIL) 5 MG tablet Take 1 tablet (5 mg total) by mouth daily. 05/19/21   Madalyn Rob, MD  magnesium gluconate (MAGONATE) 500 MG tablet Take 0.5 tablets (250 mg total) by mouth at bedtime. Patient not taking: Reported on 05/17/2021 04/03/21   Angiulli, Lavon Paganini, PA-C  metoprolol succinate (TOPROL-XL) 50 MG 24 hr tablet Take 1 tablet (50 mg total) by mouth daily. Take with or immediately following a meal. 05/19/21   Madalyn Rob, MD  Multiple Vitamin (MULTIVITAMIN WITH MINERALS) TABS tablet Take 1 tablet by mouth daily. Patient not taking: Reported on 04/27/2021 03/18/21  Debbe Odea, MD  rosuvastatin (CRESTOR) 20 MG tablet Take 1 tablet (20 mg total) by mouth daily. Patient not taking: Reported on 05/17/2021 05/05/21 05/05/22  Mitzi Hansen, MD  Continuous Blood Gluc Sensor (DEXCOM G6 SENSOR) MISC Use to check blood sugar at least 6 times a day Patient not taking: Reported on 08/29/2020 08/09/20 09/27/20  Harvie Heck, MD  Continuous Blood Gluc Transmit (DEXCOM G6 TRANSMITTER) MISC Use to check  blood sugar at least 6 times a day Patient not taking: Reported on 08/29/2020 08/09/20 09/27/20  Harvie Heck, MD      Allergies    Patient has no known allergies.    Review of Systems   Review of Systems  Unable to perform ROS: Mental status change   Physical Exam Updated Vital Signs BP 133/75    Pulse 80    Temp 97.7 F (36.5 C) (Oral)    Resp 14    LMP 08/21/2016 (Exact Date)    SpO2 100%  Physical Exam Vitals and nursing note reviewed.  Constitutional:      General: She is not in acute distress.    Appearance: She is well-developed. She is ill-appearing. She is not toxic-appearing.     Comments: Patient alert but seems confused  HENT:     Head: Normocephalic and atraumatic.     Mouth/Throat:     Mouth: Mucous membranes are dry.  Eyes:     Conjunctiva/sclera: Conjunctivae normal.     Comments: Left eye is all cloudy.  Right eye pupil is reactive  Cardiovascular:     Rate and Rhythm: Normal rate and regular rhythm.     Heart sounds: No murmur heard. Pulmonary:     Effort: Pulmonary effort is normal. No respiratory distress.     Breath sounds: Normal breath sounds.  Abdominal:     Palpations: Abdomen is soft.     Tenderness: There is no abdominal tenderness.  Musculoskeletal:        General: No swelling.     Cervical back: Neck supple.     Right lower leg: No edema.     Left lower leg: No edema.  Skin:    General: Skin is warm and dry.     Capillary Refill: Capillary refill takes less than 2 seconds.  Neurological:     Mental Status: She is alert.     Comments: We will move both lower extremities somewhat.  Could be some bilateral weakness there.  Able to hold both arms up without any drift.  Not able to get her to really comprehend to do cranial nerves  Psychiatric:        Mood and Affect: Mood normal.    ED Results / Procedures / Treatments   Labs (all labs ordered are listed, but only abnormal results are displayed) Labs Reviewed  BASIC METABOLIC PANEL -  Abnormal; Notable for the following components:      Result Value   Glucose, Bld 321 (*)    BUN 31 (*)    Creatinine, Ser 1.25 (*)    GFR, Estimated 53 (*)    All other components within normal limits  CBG MONITORING, ED - Abnormal; Notable for the following components:   Glucose-Capillary 289 (*)    All other components within normal limits  I-STAT CHEM 8, ED - Abnormal; Notable for the following components:   Sodium 133 (*)    Potassium 5.2 (*)    BUN 43 (*)    Creatinine, Ser 1.20 (*)  Glucose, Bld 322 (*)    All other components within normal limits  I-STAT ARTERIAL BLOOD GAS, ED - Abnormal; Notable for the following components:   Sodium 133 (*)    All other components within normal limits  CBG MONITORING, ED - Abnormal; Notable for the following components:   Glucose-Capillary 269 (*)    All other components within normal limits  RESP PANEL BY RT-PCR (FLU A&B, COVID) ARPGX2  CBC  URINALYSIS, ROUTINE W REFLEX MICROSCOPIC  ETHANOL  PROTIME-INR  APTT  DIFFERENTIAL  RAPID URINE DRUG SCREEN, HOSP PERFORMED  AMMONIA  HEPATIC FUNCTION PANEL  LIPASE, BLOOD  I-STAT BETA HCG BLOOD, ED ([facility], [facility], [facility] ONLY)  I-STAT CHEM 8, ED    EKG EKG Interpretation  Date/Time:  Monday June 04 2021 12:06:19 EST Ventricular Rate:  80 PR Interval:  137 QRS Duration: 93 QT Interval:  409 QTC Calculation: 472 R Axis:   11 Text Interpretation: Sinus rhythm Abnormal R-wave progression, early transition No significant change since last tracing Confirmed by Fredia Sorrow [id number]) on 06/04/2021 12:10:12 PM  Radiology No results found.  Procedures Procedures    Medications Ordered in ED Medications  sodium chloride 0.9 % bolus 500 mL (has no administration in time range)    Followed by  0.9 %  sodium chloride infusion (has no administration in time range)    ED Course/ Medical Decision Making/ A&P                           Medical Decision Making Amount and/or Complexity of  Data Reviewed Labs: ordered.  I did use Arabic interpreter.  They felt that she had significant degree of confusion.  No major focal neurodeficits.  With persons patient's definitely had infarcts in the past in the fall.  So certainly a possibility.  EMS reported that patient had been sick for 2 weeks.  Feel the patient does not fit into code stroke protocol.  We will do stroke order set.  And see what we get.  This will include CT of the head.  Patient's vital signs are reassuring temp 97.7.  Heart rate is 82 blood pressure 114/79 oxygen sats on room air are 100% and respirations are 18   Final Clinical Impression(s) / ED Diagnoses Final diagnoses:  Altered mental status, unspecified altered mental status type    Rx / DC Orders ED Discharge Orders     None         Fredia Sorrow, MD 06/04/21 1254

## 2021-06-04 NOTE — ED Notes (Signed)
Pt to ED c/o AMS, Pt remains resistant to nursing care. Noted UA is ordered, per [facility]- hold on in and out cath at this time.

## 2021-06-04 NOTE — ED Triage Notes (Addendum)
Patient arrived by [facility] for general weakness. Patient tells interpretor that she is very tired. Denis pain. Alert and oriented. Patient states that her symptoms ongoing x 2 weeks

## 2021-06-04 NOTE — ED Notes (Signed)
Patient transported to MRI 

## 2021-06-04 NOTE — ED Provider Triage Note (Signed)
Emergency Medicine Provider Triage Evaluation Note  Elanor Cale , a 51 y.o. female  was evaluated in triage.  Patient presents extremely confused. Arrived via EMS for generalized weakness.  EMS reports her symptoms been ongoing for the past 2 weeks.  She was able to converse with the triage nurse, but however on my exam she was confused even with the interpreter.  The interpreter reports that she is having slurred speech from her report of you as well.  I am unable to have the patient follow any commands although she is awake.  She is moving from side to side and using her right arm but not her left arm.  She is moving her lower legs.  She is not accompanied by any family members.  Review of Systems  Positive:  Negative:   Physical Exam  BP 114/79    Pulse 82    Temp 97.7 F (36.5 C) (Oral)    Resp 18    LMP 08/21/2016 (Exact Date)    SpO2 100%  Gen:   Confused Resp:  Normal effort  MSK:   Is moving right upper extremity and bilateral lower extremities but not moving her left upper extremity. Other:  Responsive to painful stimuli.  The patient is having some slurred speech with pooling drool on the left side of her face with some facial droop noted.  Medical Decision Making  Medically screening exam initiated at 11:45 AM.  Appropriate orders placed.  Evanthia Maund was informed that the remainder of the evaluation will be completed by another provider, this initial triage assessment does not replace that evaluation, and the importance of remaining in the ED until their evaluation is complete.  At this time, unknown of the duration of symptoms and has a mentation change from the nursing her to myself seeing her a few minutes later.  Concern for bleed.  Patient was taken to head CT immediately and was put into the blue pod.   Sherrell Puller, PA-C 06/04/21 2039

## 2021-06-04 NOTE — ED Notes (Signed)
This RN attempted to call pt son identified on the chart as emergency contact- no answer, just continuous ring. This RN notified EDP that unable to get in touch with family at time.

## 2021-06-04 NOTE — ED Provider Notes (Signed)
°  Provider Note MRN:  [id number]  Arrival date & time: 06/05/21    ED Course and Medical Decision Making  Assumed care from Dr. Maryan Rued at shift change.  AMS that has resolved, work up reassuring initially with some abnormal labs suspected to be an accurate, repeated labs are normal.  MRI is normal.  Simply does not have a ride home, lives with son who is unable to be contacted.   12:30 AM update: I was able to reach patient's son, who is happy to come pick up the patient.  Appropriate for discharge with follow-up.  Procedures  Final Clinical Impressions(s) / ED Diagnoses     ICD-10-CM   1. Altered mental status, unspecified altered mental status type  R41.82     2. Hyperglycemia  R73.9       ED Discharge Orders     None         Discharge Instructions      You were evaluated in the Emergency Department and after careful evaluation, we did not find any emergent condition requiring admission or further testing in the hospital.  Your exam/testing today was overall reassuring.  Recommend close follow-up with primary care doctor to further discuss your symptoms.  Please return to the Emergency Department if you experience any worsening of your condition.  Thank you for allowing [place] to be a part of your care.       Barth Kirks. Sedonia Small, [place] [email or web address]    Maudie Flakes, MD 06/05/21 [phone]

## 2021-06-04 NOTE — ED Notes (Signed)
Patient transported to CT 

## 2021-06-05 NOTE — Discharge Instructions (Addendum)
You were evaluated in the Emergency Department and after careful evaluation, we did not find any emergent condition requiring admission or further testing in the hospital.  Your exam/testing today was overall reassuring.  Recommend close follow-up with primary care doctor to further discuss your symptoms.  Please return to the Emergency Department if you experience any worsening of your condition.  Thank you for allowing [place] to be a part of your care.

## 2021-06-07 ENCOUNTER — Encounter: Payer: Medicaid Other | Admitting: Student

## 2021-06-11 ENCOUNTER — Encounter: Payer: Medicaid Other | Admitting: Internal Medicine

## 2021-06-14 ENCOUNTER — Encounter: Payer: Self-pay | Admitting: Internal Medicine

## 2021-06-15 ENCOUNTER — Encounter: Payer: Self-pay | Admitting: Internal Medicine

## 2021-06-15 ENCOUNTER — Emergency Department (HOSPITAL_COMMUNITY)
Admission: EM | Admit: 2021-06-15 | Discharge: 2021-06-15 | Disposition: A | Discharge status: Home / Self Care | Payer: Medicaid Other | Attending: Emergency Medicine | Admitting: Emergency Medicine

## 2021-06-15 ENCOUNTER — Encounter (HOSPITAL_COMMUNITY): Payer: Self-pay | Admitting: Emergency Medicine

## 2021-06-15 ENCOUNTER — Ambulatory Visit (INDEPENDENT_AMBULATORY_CARE_PROVIDER_SITE_OTHER): Payer: Medicaid Other | Admitting: Internal Medicine

## 2021-06-15 ENCOUNTER — Other Ambulatory Visit: Payer: Self-pay

## 2021-06-15 VITALS — BP 95/60 | HR 85 | Temp 97.6°F

## 2021-06-15 DIAGNOSIS — Z9189 Other specified personal risk factors, not elsewhere classified: Secondary | ICD-10-CM

## 2021-06-15 DIAGNOSIS — Z794 Long term (current) use of insulin: Secondary | ICD-10-CM

## 2021-06-15 DIAGNOSIS — R739 Hyperglycemia, unspecified: Secondary | ICD-10-CM | POA: Insufficient documentation

## 2021-06-15 DIAGNOSIS — I951 Orthostatic hypotension: Secondary | ICD-10-CM | POA: Diagnosis not present

## 2021-06-15 DIAGNOSIS — R42 Dizziness and giddiness: Secondary | ICD-10-CM

## 2021-06-15 DIAGNOSIS — Z7901 Long term (current) use of anticoagulants: Secondary | ICD-10-CM | POA: Insufficient documentation

## 2021-06-15 DIAGNOSIS — E11319 Type 2 diabetes mellitus with unspecified diabetic retinopathy without macular edema: Secondary | ICD-10-CM

## 2021-06-15 DIAGNOSIS — E1165 Type 2 diabetes mellitus with hyperglycemia: Secondary | ICD-10-CM

## 2021-06-15 DIAGNOSIS — Z20822 Contact with and (suspected) exposure to covid-19: Secondary | ICD-10-CM | POA: Insufficient documentation

## 2021-06-15 DIAGNOSIS — N3000 Acute cystitis without hematuria: Secondary | ICD-10-CM | POA: Insufficient documentation

## 2021-06-15 LAB — COMPREHENSIVE METABOLIC PANEL
ALT: 14 U/L (ref 0–44)
AST: 18 U/L (ref 15–41)
Albumin: 3.2 g/dL — ABNORMAL LOW (ref 3.5–5.0)
Alkaline Phosphatase: 83 U/L (ref 38–126)
Anion gap: 11 (ref 5–15)
BUN: 25 mg/dL — ABNORMAL HIGH (ref 6–20)
CO2: 24 mmol/L (ref 22–32)
Calcium: 9.7 mg/dL (ref 8.9–10.3)
Chloride: 97 mmol/L — ABNORMAL LOW (ref 98–111)
Creatinine, Ser: 1.43 mg/dL — ABNORMAL HIGH (ref 0.44–1.00)
GFR, Estimated: 45 mL/min — ABNORMAL LOW (ref >60)
Glucose, Bld: 466 mg/dL — ABNORMAL HIGH (ref 70–99)
Potassium: 4.6 mmol/L (ref 3.5–5.1)
Sodium: 132 mmol/L — ABNORMAL LOW (ref 135–145)
Total Bilirubin: 0.3 mg/dL (ref 0.3–1.2)
Total Protein: 7.2 g/dL (ref 6.5–8.1)

## 2021-06-15 LAB — CBC WITH DIFFERENTIAL/PLATELET
Abs Immature Granulocytes: 0 10*3/uL (ref 0.00–0.07)
Basophils Absolute: 0 10*3/uL (ref 0.0–0.1)
Basophils Relative: 1 %
Eosinophils Absolute: 0.1 10*3/uL (ref 0.0–0.5)
Eosinophils Relative: 2 %
HCT: 39.2 % (ref 36.0–46.0)
Hemoglobin: 12.7 g/dL (ref 12.0–15.0)
Immature Granulocytes: 0 %
Lymphocytes Relative: 49 %
Lymphs Abs: 2 10*3/uL (ref 0.7–4.0)
MCH: 27 pg (ref 26.0–34.0)
MCHC: 32.4 g/dL (ref 30.0–36.0)
MCV: 83.2 fL (ref 80.0–100.0)
Monocytes Absolute: 0.2 10*3/uL (ref 0.1–1.0)
Monocytes Relative: 5 %
Neutro Abs: 1.8 10*3/uL (ref 1.7–7.7)
Neutrophils Relative %: 43 %
Platelets: 156 10*3/uL (ref 150–400)
RBC: 4.71 MIL/uL (ref 3.87–5.11)
RDW: 12.2 % (ref 11.5–15.5)
WBC: 4.2 10*3/uL (ref 4.0–10.5)
nRBC: 0 % (ref 0.0–0.2)

## 2021-06-15 LAB — I-STAT VENOUS BLOOD GAS, ED
Acid-base deficit: 1 mmol/L (ref 0.0–2.0)
Bicarbonate: 26.4 mmol/L (ref 20.0–28.0)
Calcium, Ion: 1.26 mmol/L (ref 1.15–1.40)
HCT: 39 % (ref 36.0–46.0)
Hemoglobin: 13.3 g/dL (ref 12.0–15.0)
O2 Saturation: 97 %
Potassium: 4.6 mmol/L (ref 3.5–5.1)
Sodium: 131 mmol/L — ABNORMAL LOW (ref 135–145)
TCO2: 28 mmol/L (ref 22–32)
pCO2, Ven: 51.9 mmHg (ref 44–60)
pH, Ven: 7.314 (ref 7.25–7.43)
pO2, Ven: 101 mmHg — ABNORMAL HIGH (ref 32–45)

## 2021-06-15 LAB — URINALYSIS, ROUTINE W REFLEX MICROSCOPIC
Bilirubin Urine: NEGATIVE
Glucose, UA: >=500 mg/dL — AB
Ketones, ur: NEGATIVE mg/dL
Nitrite: NEGATIVE
Protein, ur: 100 mg/dL — AB
Specific Gravity, Urine: 1.011 (ref 1.005–1.030)
WBC, UA: >50 WBC/hpf — ABNORMAL HIGH (ref 0–5)
pH: 6 (ref 5.0–8.0)

## 2021-06-15 LAB — POCT URINALYSIS DIPSTICK
Bilirubin, UA: NEGATIVE
Glucose, UA: POSITIVE — AB
Ketones, UA: NEGATIVE
Nitrite, UA: NEGATIVE
Protein, UA: POSITIVE — AB
Spec Grav, UA: 1.02 (ref 1.010–1.025)
Urobilinogen, UA: 0.2 E.U./dL
pH, UA: 5.5 (ref 5.0–8.0)

## 2021-06-15 LAB — PREGNANCY, URINE: Preg Test, Ur: NEGATIVE

## 2021-06-15 LAB — RESP PANEL BY RT-PCR (FLU A&B, COVID) ARPGX2
Influenza A by PCR: NEGATIVE
Influenza B by PCR: NEGATIVE
SARS Coronavirus 2 by RT PCR: NEGATIVE

## 2021-06-15 LAB — GLUCOSE, CAPILLARY: Glucose-Capillary: 470 mg/dL — ABNORMAL HIGH (ref 70–99)

## 2021-06-15 LAB — BETA-HYDROXYBUTYRIC ACID: Beta-Hydroxybutyric Acid: 0.7 mmol/L — ABNORMAL HIGH (ref 0.05–0.27)

## 2021-06-15 LAB — CBG MONITORING, ED
Glucose-Capillary: 421 mg/dL — ABNORMAL HIGH (ref 70–99)
Glucose-Capillary: 434 mg/dL — ABNORMAL HIGH (ref 70–99)
Glucose-Capillary: 357 mg/dL — ABNORMAL HIGH (ref 70–99)

## 2021-06-15 MED ORDER — INSULIN ASPART 100 UNIT/ML IJ SOLN
10.0000 [IU] | Freq: Once | INTRAMUSCULAR | Status: AC
  Administered 2021-06-15: 10 [IU] via INTRAVENOUS

## 2021-06-15 MED ORDER — METOCLOPRAMIDE HCL 5 MG/ML IJ SOLN
10.0000 mg | Freq: Once | INTRAMUSCULAR | Status: AC
  Administered 2021-06-15: 10 mg via INTRAVENOUS
  Filled 2021-06-15: qty 2

## 2021-06-15 MED ORDER — DIPHENHYDRAMINE HCL 25 MG PO CAPS
25.0000 mg | ORAL_CAPSULE | Freq: Once | ORAL | Status: AC
Start: 2021-06-15 — End: 2021-06-15
  Administered 2021-06-15: 25 mg via ORAL
  Filled 2021-06-15: qty 1

## 2021-06-15 MED ORDER — CEPHALEXIN 500 MG PO CAPS: 500.0000 mg | ORAL_CAPSULE | Freq: Two times a day (BID) | ORAL | 0 refills | Status: AC

## 2021-06-15 MED ORDER — LACTATED RINGERS IV BOLUS
1000.0000 mL | Freq: Once | INTRAVENOUS | Status: AC
  Administered 2021-06-15: 1000 mL via INTRAVENOUS

## 2021-06-15 NOTE — ED Provider Notes (Signed)
Care of patient assumed from [facility] at 1900.  Agree with history, physical exam and plan.  See their note for further details. Briefly, 51 year old female presents to the emergency department with a chief complaint of hypoglycemia and hypotension.  Patient endorses generalized fatigue and nausea.  At time of handoff urinalysis and urine pregnancy test pending.  Urinalysis shows bacteria rare, squamous epithelial 6-10, WBC greater than 50, leukocytes moderate, nitrite negative.  With patient reporting urinary frequency will treat for urinary tract infection with 7-day course of Keflex.  Urine pregnancy is negative.   Dyann Ruddle 06/15/21 2348    Truddie Hidden, MD 06/18/21 [phone]

## 2021-06-15 NOTE — ED Provider Notes (Signed)
[facility] EMERGENCY DEPARTMENT Provider Note   CSN: [id number] Arrival date & time: 06/15/21  1217     History  Chief Complaint  Patient presents with   Hyperglycemia    Jennifer [name] is a 51 y.o. female.   Hyperglycemia Jennifer [name] , a 51 y.o. female  was evaluated in triage.  Pt complains of hyperglycemia and hypotension.  Patient was sent from the doctor's office due to hypotension with systolic blood pressure 12/45 and hyperglycemia with blood sugar at 417.  Patient reports that she has been taking her medication as prescribed.  She endorses generalized fatigue and nausea.  Denies any vomiting or diarrhea.  She denies any urinary frequency urgency dysuria or hematuria.  Denies any abdominal pain states that she has felt occasionally nauseous and also has had a mild headache but denies any neck stiffness denies any cough congestion fevers denies any diarrhea.  She states that she does not have a glucometer with her but does have one somewhere at home.  States that she has been taking her medicine as prescribed.     Home Medications Prior to Admission medications   Medication Sig Start Date End Date Taking? Authorizing Provider  acetaminophen (TYLENOL) 325 MG tablet Take 2 tablets (650 mg total) by mouth every 6 (six) hours as needed for mild pain (or Fever >/= 101). 04/03/21   Angiulli, Lavon Paganini, PA-C  apixaban (ELIQUIS) 5 MG TABS tablet Take 1 tablet (5 mg total) by mouth 2 (two) times daily. 04/03/21   Angiulli, Lavon Paganini, PA-C  brimonidine (ALPHAGAN) 0.2 % ophthalmic solution Place 1 drop into the left eye 3 (three) times daily. Patient taking differently: Place 1 drop into both eyes 3 (three) times daily. 04/03/21   Angiulli, Lavon Paganini, PA-C  calcium citrate-vitamin D (CITRACAL+D) 315-200 MG-UNIT tablet Take 2 tablets by mouth 2 (two) times daily. Patient not taking: Reported on 04/27/2021 04/03/21 04/03/22  Angiulli, Lavon Paganini, PA-C  dorzolamide-timolol  (COSOPT) 22.3-6.8 MG/ML ophthalmic solution Place 1 drop into the left eye 2 (two) times daily. 04/03/21   Angiulli, Lavon Paganini, PA-C  gabapentin (NEURONTIN) 300 MG capsule Take 1 capsule (300 mg total) by mouth at bedtime. Patient taking differently: Take 300 mg by mouth 3 (three) times daily. 04/03/21 04/03/22  Angiulli, Lavon Paganini, PA-C  insulin aspart (NOVOLOG FLEXPEN) 100 UNIT/ML FlexPen Inject 3 Units into the skin 3 (three) times daily with meals. 04/14/21   Meredith Staggers, MD  insulin detemir (LEVEMIR) 100 UNIT/ML FlexPen Inject 15 Units into the skin at bedtime. 05/29/21   Raulkar, Clide Deutscher, MD  linagliptin (TRADJENTA) 5 MG TABS tablet Take 1 tablet (5 mg total) by mouth daily. 04/03/21 05/16/21  Angiulli, Lavon Paganini, PA-C  lisinopril (ZESTRIL) 5 MG tablet Take 1 tablet (5 mg total) by mouth daily. 05/19/21   Madalyn Rob, MD  magnesium gluconate (MAGONATE) 500 MG tablet Take 0.5 tablets (250 mg total) by mouth at bedtime. Patient not taking: Reported on 05/17/2021 04/03/21   Angiulli, Lavon Paganini, PA-C  metoprolol succinate (TOPROL-XL) 50 MG 24 hr tablet Take 1 tablet (50 mg total) by mouth daily. Take with or immediately following a meal. 05/19/21   Madalyn Rob, MD  Multiple Vitamin (MULTIVITAMIN WITH MINERALS) TABS tablet Take 1 tablet by mouth daily. Patient not taking: Reported on 04/27/2021 03/18/21   Debbe Odea, MD  rosuvastatin (CRESTOR) 20 MG tablet Take 1 tablet (20 mg total) by mouth daily. Patient not taking: Reported on 05/17/2021 05/05/21 05/05/22  Mitzi Hansen, MD  Continuous Blood Gluc Sensor (DEXCOM G6 SENSOR) MISC Use to check blood sugar at least 6 times a day Patient not taking: Reported on 08/29/2020 08/09/20 09/27/20  Harvie Heck, MD  Continuous Blood Gluc Transmit (DEXCOM G6 TRANSMITTER) MISC Use to check blood sugar at least 6 times a day Patient not taking: Reported on 08/29/2020 08/09/20 09/27/20  Harvie Heck, MD      Allergies    Patient has no known allergies.    Review  of Systems   Review of Systems  Physical Exam Updated Vital Signs BP (!) 165/73    Pulse 77    Temp 97.7 F (36.5 C) (Oral)    Resp 18    LMP 08/21/2016 (Exact Date)    SpO2 100%  Physical Exam Vitals and nursing note reviewed.  Constitutional:      General: She is not in acute distress.    Appearance: She is not ill-appearing.     Comments: Pleasant 51 year old female Not toxic appearing  HENT:     Head: Normocephalic and atraumatic.     Nose: Nose normal.  Eyes:     General: No scleral icterus. Cardiovascular:     Rate and Rhythm: Normal rate and regular rhythm.     Pulses: Normal pulses.     Heart sounds: Normal heart sounds.  Pulmonary:     Effort: Pulmonary effort is normal. No respiratory distress.     Breath sounds: Normal breath sounds. No wheezing.  Abdominal:     Palpations: Abdomen is soft.     Tenderness: There is no abdominal tenderness. There is no guarding or rebound.  Musculoskeletal:     Cervical back: Normal range of motion.     Right lower leg: No edema.     Left lower leg: No edema.  Skin:    General: Skin is warm and dry.     Capillary Refill: Capillary refill takes less than 2 seconds.  Neurological:     Mental Status: She is alert. Mental status is at baseline.  Psychiatric:        Mood and Affect: Mood normal.        Behavior: Behavior normal.    ED Results / Procedures / Treatments   Labs (all labs ordered are listed, but only abnormal results are displayed) Labs Reviewed  BETA-HYDROXYBUTYRIC ACID - Abnormal; Notable for the following components:      Result Value   Beta-Hydroxybutyric Acid 0.70 (*)    All other components within normal limits  COMPREHENSIVE METABOLIC PANEL - Abnormal; Notable for the following components:   Sodium 132 (*)    Chloride 97 (*)    Glucose, Bld 466 (*)    BUN 25 (*)    Creatinine, Ser 1.43 (*)    Albumin 3.2 (*)    GFR, Estimated 45 (*)    All other components within normal limits  CBG MONITORING, ED -  Abnormal; Notable for the following components:   Glucose-Capillary 421 (*)    All other components within normal limits  CBG MONITORING, ED - Abnormal; Notable for the following components:   Glucose-Capillary 434 (*)    All other components within normal limits  I-STAT VENOUS BLOOD GAS, ED - Abnormal; Notable for the following components:   pO2, Ven 101 (*)    Sodium 131 (*)    All other components within normal limits  CBG MONITORING, ED - Abnormal; Notable for the following components:   Glucose-Capillary 357 (*)  All other components within normal limits  RESP PANEL BY RT-PCR (FLU A&B, COVID) ARPGX2  CBC WITH DIFFERENTIAL/PLATELET  URINALYSIS, ROUTINE W REFLEX MICROSCOPIC  PREGNANCY, URINE    EKG None  Radiology No results found.  Procedures Procedures    Medications Ordered in ED Medications  lactated ringers bolus 1,000 mL (1,000 mLs Intravenous Bolus 06/15/21 1657)  insulin aspart (novoLOG) injection 10 Units (10 Units Intravenous Given 06/15/21 1658)  metoCLOPramide (REGLAN) injection 10 mg (10 mg Intravenous Given 06/15/21 1852)  diphenhydrAMINE (BENADRYL) capsule 25 mg (25 mg Oral Given 06/15/21 1853)    ED Course/ Medical Decision Making/ A&P Clinical Course as of 06/15/21 1901  Fri Jun 15, 2021  1855 pH, Ven: 7.314 [WF]    Clinical Course User Index [WF] Tedd Sias, [place]                           Medical Decision Making Amount and/or Complexity of Data Reviewed Labs: ordered.  Risk Prescription drug management.   Patient is a 51 year old female with a past medical history significant for DM2  She is presented to emergency room today after she was sent by her PCP after she was found to be hyperglycemic in office and had a borderline low blood pressure with systolic BP in the 57W.  After presenting to the emergency room here her blood pressure improved with 1 L of normal saline and she is now somewhat hypertensive at 155/84.  She states she feels  significantly improved.  She denies any urinary symptoms other than some frequency which could be explained by her hyperglycemia.  Also endorses some mild nausea and headache.  Given 1 L of crystalloid, insulin 10 units and Reglan Benadryl.  Patient is without acidosis or anion gap.  CMP notable for mild hyponatremia which corrects to normal with adjustment for blood sugar.  BUN marginally elevated likely due to some dehydration but creatinine stable.  CBC without leukocytosis anemia no cough fevers congestion diarrhea I have low suspicion for infection driving this.  Patient is ultimately ready for discharge however urine sample pending.  Plan to discharge home after this.  She feels much improved. Dr. Karle Starch to follow up on urinalysis if she is able to provide a sample.  She did pee earlier but the urine was discarded.  Ultimately oncoming provider's discretion to hold for urinalysis or not.  Final Clinical Impression(s) / ED Diagnoses Final diagnoses:  Hyperglycemia    Rx / DC Orders ED Discharge Orders     None         Tedd Sias, [place] 06/15/21 1903    Truddie Hidden, MD 06/15/21 2253

## 2021-06-15 NOTE — Assessment & Plan Note (Signed)
Concerned that patient is not taking her medications as directed though unable to assess her meter today to monitor her recen tsugars

## 2021-06-15 NOTE — ED Notes (Signed)
Attempted IV placement twice another RN at bedside attempting

## 2021-06-15 NOTE — ED Notes (Signed)
Placed back on bedpan

## 2021-06-15 NOTE — Progress Notes (Addendum)
° °  CC: dizziness  HPI:  Jennifer [name] is a 51 y.o. PMH noted below, who presents to the [facility] with complaints of dizziness. To see the management of his acute and chronic conditions, please refer to the A&P note under the encounters tab.   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   Annotation: secondary to uveitis of unknown etiology Qualifier: Diagnosis of  By: Hilma Favors  DO, Beth     Hair loss    History of fracture of clavicle 05/18/2015   Hyperlipidemia    Hypertension    Hypertension associated with diabetes ([place]) 07/23/2016   Iron deficiency anemia 05/13/2013   Left anterior shoulder pain 07/06/2017   NSTEMI (non-ST elevated myocardial infarction) ([place]) 02/04/2021   Pap smear abnormality of cervix with LGSIL    Routine/ritual circumcision    Type II diabetes mellitus ([place])    Uveitis    Review of Systems:  positive for decreased appetite, fatigue, dizziness, weakness  Physical Exam: Gen: appears older than stated age, uncomfortable but in NAD HEENT: normocephalic atraumatic, MMM, neck supple CV: RRR Resp: normal WOB GI: soft, nontender MSK: moves all extremities without difficulty Skin:warm and dry Neuro:drowsy, answering questions appropriately Psych: normal affect   Assessment & Plan:   See Encounters Tab for problem based charting.  Patient seen with Dr.  Cain Sieve     Internal Medicine Clinic Attending  I saw and evaluated the patient.  I personally confirmed the key portions of the history and exam documented by Dr. Ileene Musa and I reviewed pertinent patient test results.  The assessment,  diagnosis, and plan were formulated together and I agree with the documentation in the residents note.   Patient is severely hyperglycemic and dehydrated. She needs to go to the ER for insulin and IVF

## 2021-06-15 NOTE — Assessment & Plan Note (Signed)
Patient is dizzy and hypotensive in clinic. Does not have her glucose meter with her. Fingerstick glucose is 472, urine negative for ketones but has >1000 glucose. Unable to manage her condition here in clinic with observation given it is Friday afternoon and do not have nursing staff available. Patient needs IV fluids, insulin, and a BMP checked. Given the severity of her presentation she needs to go to ER and may warrant admission vs observation. - patient sent to ER - oral rehydration initiated while in clinic

## 2021-06-15 NOTE — Discharge Instructions (Addendum)
Your blood sugar was quite high today.  Please drink plenty of water, check your blood sugar regularly and follow-up with your primary care doctor.  You were found to have a urinary tract infection.  Please take antibiotics as prescribed.  Please return to the emergency room for any new or concerning symptoms otherwise please continue to use your insulin and monitor your blood sugar with your glucometer.

## 2021-06-15 NOTE — Assessment & Plan Note (Signed)
Blood pressure low today in clinic 95/60 in the setting of dehydration and hyperglycemia. Will need to recheck upon next clinic visit/after stabilization.

## 2021-06-15 NOTE — Assessment & Plan Note (Signed)
Patient initially made today's appointment for hypoglycemia. Son is with patient and says that she has been having low blood sugars. Unfortunately he can't quantify these numbers and the patient forgot her meter. Her sugar today in clinic is 472. I am concerned that she has been having lows and as a result has stopped taking her insulin. - patient sent to ED today for fluids and insulin

## 2021-06-15 NOTE — ED Triage Notes (Signed)
Pt here POV sent from her PCP d/t hyperglycemia.  C/o of diarrhea , denies N/V. CBG at PCP >400.Speaks arabic. Alert and oriented X4.

## 2021-06-15 NOTE — Patient Instructions (Signed)
Jennifer [name]  It was a pleasure seeing you in the clinic today.   We are sending you to the ER because your blood sugars are elevated and your blood pressure is low. They will check your blood work and give you IV fluids.  Please call our clinic at [phone] if you have any questions or concerns. The best time to call is Monday-Friday from 9am-4pm, but there is someone available 24/7 at the same number. If you need medication refills, please notify your pharmacy one week in advance and they will send [place] a request.   Thank you for letting us take part in your care. We look forward to seeing you next time!

## 2021-06-15 NOTE — ED Provider Triage Note (Signed)
**Note De-Identified Jennifer Obfuscation** Emergency Medicine Provider Triage Evaluation Note  Jennifer [name] , a 51 y.o. female  was evaluated in triage.  Pt complains of hyperglycemia and hypotension.  Patient was sent from the doctor's office due to hypotension with systolic blood pressure 03/83 and hyperglycemia with blood sugar at 417.  Patient reports that she has been taking her medication as prescribed.  She endorses generalized fatigue and nausea.  Denies any vomiting or diarrhea.  Review of Systems  Positive: Generalized fatigue, hyperglycemia, hypotension Negative: Vomiting, diarrhea, rhinorrhea, nasal congestion, cough  Physical Exam  BP 105/70 (BP Location: Left Arm)    Pulse 84    Temp 97.7 F (36.5 C) (Oral)    LMP 08/21/2016 (Exact Date)    SpO2 97%  Gen:   Awake, no distress   Resp:  Normal effort  MSK:   Moves extremities without difficulty  Other:  Abdomen soft, nondistended, nontender with no guarding or rebound tenderness.  Medical Decision Making  Medically screening exam initiated at 1:51 PM.  Appropriate orders placed.  Jennifer [name] was informed that the remainder of the evaluation will be completed by another provider, this initial triage assessment does not replace that evaluation, and the importance of remaining in the ED until their evaluation is complete.  Concern for possible DKA.  Will make patient level to move back to next available room.   [name], [place] 06/15/21 1352

## 2021-06-21 ENCOUNTER — Other Ambulatory Visit: Payer: Self-pay | Admitting: Obstetrics and Gynecology

## 2021-06-21 NOTE — Patient Instructions (Signed)
Hi [name] - as a part of your Medicaid benefit, you are eligible for care management and care coordination services at no cost or copay. I was unable to reach you by phone today but would be happy to help you with your health related needs. Please feel free to call me at [phone].  ? ?A member of the Managed Medicaid care management team will reach out to you again over the next 7-14 days.  ? ?[name] RN, BSN ?[facility] ?Care Management Coordinator - Managed Medicaid High Risk ?[phone] ?  ?

## 2021-06-21 NOTE — Patient Outreach (Signed)
Care Coordination ? ?06/21/2021 ? ?[name] ?03-13-1971 ?[id number] ? ? ?Medicaid Managed Care  ? ?Unsuccessful Outreach Note ? ?06/21/2021 ?Name: Jennifer [name] MRN: [id number] DOB: 03-16-1971 ? ?Referred by: Jose Persia, MD ?Reason for referral : High Risk Managed Medicaid (Unsuccessful telephone outreach) ? ? ?An unsuccessful telephone outreach was attempted today. The patient was referred to the case management team for assistance with care management and care coordination.  ? ?Follow Up Plan: A member of the Managed Medicaid Care Management Team will reach out to the patient within 7-14 business days. ? ?[name] RN, BSN ?[facility] ?Care Management Coordinator - Managed Medicaid High Risk ?[phone] ?  ? ? ?

## 2021-06-25 ENCOUNTER — Telehealth: Payer: Self-pay | Admitting: Internal Medicine

## 2021-06-25 NOTE — Telephone Encounter (Signed)
Refill Request- patient is out of med ? ?insulin aspart (NOVOLOG FLEXPEN) 100 UNIT/ML FlexPen ? ?insulin detemir (LEVEMIR) 100 UNIT/ML FlexPen ? ? ? ?[id number] [place], [place] - [address](Ph: [phone] ?

## 2021-06-25 NOTE — Telephone Encounter (Signed)
Call to Pharmacy patient has refills on both Insulins.  Call to daughter to inform her that patient has refills on her Insulin and that the pharmacy is going to get the medications ready for patient.  Daughter said that patient is in need of the pen needles. ?

## 2021-06-26 ENCOUNTER — Telehealth: Payer: Self-pay

## 2021-06-26 NOTE — Telephone Encounter (Signed)
[facility] sent a fax for BD Nano 2 Gen Pen Needles 32G 4 mm. Please advise. ?

## 2021-06-27 ENCOUNTER — Other Ambulatory Visit: Payer: Self-pay | Admitting: Internal Medicine

## 2021-06-27 MED ORDER — PEN NEEDLES 32G X 4 MM MISC: 11 refills | Status: AC

## 2021-06-29 MED ORDER — BD PEN NEEDLE NANO 2ND GEN 32G X 4 MM MISC: 1.0000 | 3 refills | Status: DC | PRN

## 2021-06-29 NOTE — Telephone Encounter (Signed)
Phoned in pen needles ?

## 2021-06-29 NOTE — Addendum Note (Signed)
Addended by: Casilda Carls on: 06/29/2021 11:08 AM ? ? Modules accepted: Orders ? ?

## 2021-07-10 ENCOUNTER — Other Ambulatory Visit: Payer: Self-pay | Admitting: Physical Medicine and Rehabilitation

## 2021-07-10 DIAGNOSIS — I63419 Cerebral infarction due to embolism of unspecified middle cerebral artery: Secondary | ICD-10-CM

## 2021-07-11 ENCOUNTER — Other Ambulatory Visit: Payer: Self-pay | Admitting: Obstetrics and Gynecology

## 2021-07-11 NOTE — Patient Instructions (Signed)
Hi Ms. Parchment, thank you for speaking with me-have a nice afternoon! ? ?Ms. Jari Favre given information about Medicaid Managed Care team care coordination services as a part of their [place] Medicaid benefit. [name] / [name] verbally consented to engagement with the [facility] Managed Care team.  ? ?If you are experiencing a medical emergency, please call 911 or report to your local emergency department or urgent care.  ? ?If you have a non-emergency medical problem during routine business hours, please contact your provider's office and ask to speak with a nurse.  ? ?For questions related to your [facility], please call: [phone] or visit the homepage here: [email or web address] ? ?If you would like to schedule transportation through your [facility], please call the following number at least 2 days in advance of your appointment: [phone]. ? Rides for urgent appointments can also be made after hours by calling Member Services. ? ?Call the [place] at [phone], at any time, 24 hours a day, 7 days a week. If you are in danger or need immediate medical attention call 911. ? ?If you would like help to quit smoking, call 1-800-QUIT-NOW 309-314-4061) OR Espa?ol: 1-855-D?jelo-Ya 561-380-9884) o para m?s informaci?n haga clic aqu? or Text READY to 200-400 to register via text ? ?Ms. Phineas Douglas / [name] - following are the goals we discussed in your visit today:  ? Goals Addressed   ? ?  ?  ?  ?  ? This Visit's Progress  ?  Protect My Health     ?  Timeframe:  Long-Range Goal ?Priority:  High ?Start Date:       08/29/20                      ?Expected End Date:  ongoing          ? ?Follow Up Date:  08/11/21 ?  ?- schedule appointment for flu shot ?- schedule appointment for vaccines needed due to my age or health ?- schedule recommended health tests  (blood work, mammogram, colonoscopy, pap test) ?- schedule and keep appointment for annual check-up  ? ?07/11/21:  Patient's daughter states PT has started.  Patient's blood sugars elevated and patient's daughter states she is following up with provider ?  ?Why is this important?   ?Screening tests can find diseases early when they are easier to treat.  ?Your doctor or nurse will talk with you about which tests are important for you.  ?Getting shots for common diseases like the flu and shingles will help prevent them.   ? ?Patient / [name] verbalizes understanding of instructions and care plan provided today and agrees to view in [place]. Active MyChart status confirmed with patient.   ? ?The Managed Medicaid care management team will reach out to the patient / [name] again over the next 30 days.  ?The  [employer] ([name]) has been provided with contact information for the Managed Medicaid care management team and has been advised to call with any health related questions or concerns.  ? ?[name] RN, BSN ?[facility] ?Care Management Coordinator - Managed Medicaid High Risk ?[phone] ? ?Following is a copy of your plan of care:  ?Care Plan : General Plan of Care (Adult)  ?Updates made by [name], RN since 07/11/2021 12:00 AM  ?  ? ?Problem: Health Promotion or Disease Self-Management (General Plan of Care)   ?  Priority: High  ?Onset Date: 08/29/2020  ?  ? ?Long-Range Goal: Self-Management Plan Developed   ?Start Date: 08/29/2020  ?Expected End Date: 10/11/2021  ?Recent Progress: Not on track  ?Priority: High  ?Note:   ?Current Barriers:  ?Ineffective Self Health Maintenance ?Patient with diabetes, hypertension, deconditioning, depression ?Currently UNABLE TO independently self manage needs related to chronic health conditions.  ?Knowledge Deficits related to short term plan for care coordination needs and long term plans for chronic disease management needs ?07/11/21:  Per  patient's daughter, patient is currently having PT.  Blood sugars in the 400s per patient's daughter-she states she is following up with the provider.  ?Nurse Case Manager Clinical Goal(s):  ?patient will work with care management team to address care coordination and chronic disease management needs related to Disease Management ?Educational Needs ?Care Coordination ?Medication Management and Education ?Medication Reconciliation ?Medication Assistance  ?Psychosocial Support ?Mental Health Counseling   ?Interventions:  ?Evaluation of current treatment plan and patient's adherence to plan as established by provider. ?Reviewed medications with patient/patient's daughter. ?Collaborated with Pharmacy ?Pharmacy referral for medication review. ?Collaborated with Social Work regarding depression.  Referral for financial resources-needs rent assistance. ?Discussed plans with patient for ongoing care management follow up and provided patient with direct contact information for care management team ?Reviewed scheduled/upcoming provider appointments.  ?Social Work referral for depression. ?Patient's daughter given [facility] outpatient PT phone number to call for an appointment. ?Collaborated with PCP regarding blood sugars, PT. ?07/11/21:  Patient currently participating in PT ?Self Care Activities:  ?Patient will self administer medications as prescribed ?Patient will attend all scheduled provider appointments ?Patient/DPRwill call pharmacy for medication refills ?Patient/[name] will attend church or other social activities ?Patient/[name] will continue to perform ADL's independently ?Patient/[name] will call provider office for new concerns or questions ?Patient will work Social Work to address care coordination needs and will continue to work with the clinical team to address health care and disease management related needs.   ?Patient Goals: ?In the next 30 days, patient will attend all provider appointments. ?In the next 30 days,  patient will take medications as directed. ?In the next 30 days, patient/patient's daughter  will speak with Pharmacist. ? ?Follow Up Plan: [name] has been provided with contact information for the care management team and has been advised to call with any health related questions or concerns.  ?The care management team will reach out to the patient/patient's daughter.again over the next 30 days.    ?  ?

## 2021-07-11 NOTE — Patient Outreach (Signed)
?Medicaid Managed Care   ?Nurse Care Manager Note ? ?07/11/2021 ?Name:  Jennifer [name] MRN:  [id number] DOB:  04-Feb-1971 ? ?Jennifer [name] is an 51 y.o. year old female who is a primary patient of Jennifer Persia, MD.  The [facility] Managed Care Coordination team was consulted for assistance with:    ?Chronic healthcare management needs, DM, HTN, migraines, depression, HLD, vision loss-left ? ?Jennifer [name] was given information about Medicaid Managed Care Coordination team services today. Jennifer [name] Patient agreed to services and verbal consent obtained. ? ?Engaged with patient by telephone for follow up visit in response to provider referral for case management and/or care coordination services.  ? ?Assessments/Interventions:  Review of past medical history, allergies, medications, health status, including review of consultants reports, laboratory and other test data, was performed as part of comprehensive evaluation and provision of chronic care management services. ? ?SDOH (Social Determinants of Health) assessments and interventions performed: ? ?Care Plan ? ?No Known Allergies ? ?Medications Reviewed Today   ? ? Reviewed by Jennifer Presto, MD (Resident) on 06/15/21 at 1240  Med List Status: <None>  ? ?Medication Order Taking? Sig Documenting Provider Last Dose Status Informant  ?acetaminophen (TYLENOL) 325 MG tablet [id number] No Take 2 tablets (650 mg total) by mouth every 6 (six) hours as needed for mild pain (or Fever >/= 101). Jennifer Parsons, PA-C 05/13/2021 Active Child, Self  ?apixaban (ELIQUIS) 5 MG TABS tablet [id number] No Take 1 tablet (5 mg total) by mouth 2 (two) times daily. Jennifer Parsons, PA-C unk last dose unk time Active Child, Self  ?brimonidine (ALPHAGAN) 0.2 % ophthalmic solution [id number] No Place 1 drop into the left eye 3 (three) times daily.  ?Patient taking differently: Place 1 drop into both eyes 3 (three) times daily.  ? Jennifer Parsons, PA-C 05/15/2021 Active Child, Self   ?calcium citrate-vitamin D (CITRACAL+D) 315-200 MG-UNIT tablet [id number] No Take 2 tablets by mouth 2 (two) times daily.  ?Patient not taking: Reported on 04/27/2021  ? Jennifer Parsons, PA-C Not Taking Active Child, Self  ?Patient not taking:  Discontinued 09/27/20 0116   ?Patient not taking:  Discontinued 09/27/20 0116   ?dorzolamide-timolol (COSOPT) 22.3-6.8 MG/ML ophthalmic solution [id number] No Place 1 drop into the left eye 2 (two) times daily. Jennifer Parsons, PA-C 05/15/2021 Active Child, Self  ?gabapentin (NEURONTIN) 300 MG capsule [id number] No Take 1 capsule (300 mg total) by mouth at bedtime.  ?Patient taking differently: Take 300 mg by mouth 3 (three) times daily.  ? Angiulli, Lavon Paganini, PA-C unk last dose Active Child, Self  ?insulin aspart (NOVOLOG FLEXPEN) 100 UNIT/ML FlexPen [id number] No Inject 3 Units into the skin 3 (three) times daily with meals. Jennifer Staggers, MD 05/15/2021 Active Child, Self  ?insulin detemir (LEVEMIR) 100 UNIT/ML FlexPen [id number]  Inject 15 Units into the skin at bedtime. Jennifer Ribas, MD  Active   ?linagliptin (TRADJENTA) 5 MG TABS tablet [id number] No Take 1 tablet (5 mg total) by mouth daily. Jennifer Parsons, PA-C unk last dose Expired 05/16/21 2359   ?lisinopril (ZESTRIL) 5 MG tablet [id number]  Take 1 tablet (5 mg total) by mouth daily. Madalyn Rob, MD  Active   ?magnesium gluconate (MAGONATE) 500 MG tablet [id number] No Take 0.5 tablets (250 mg total) by mouth at bedtime.  ?Patient not taking: Reported on 05/17/2021  ? Jennifer Parsons, PA-C Not Taking Active Child, Self  ?metoprolol succinate (TOPROL-XL) 50 MG 24 hr tablet [id number]  Take 1 tablet (50  mg total) by mouth daily. Take with or immediately following a meal. Madalyn Rob, MD  Active   ?Multiple Vitamin (MULTIVITAMIN WITH MINERALS) TABS tablet [id number] No Take 1 tablet by mouth daily.  ?Patient not taking: Reported on 04/27/2021  ? Debbe Odea, MD Not Taking Active Child, Self  ?rosuvastatin  (CRESTOR) 20 MG tablet [id number] No Take 1 tablet (20 mg total) by mouth daily.  ?Patient not taking: Reported on 05/17/2021  ? Mitzi Hansen, MD Not Taking Active Child, Self  ?Med List Note Jennifer [name], CPhT 10/02/20 2155): Jennifer [name] can help  ? ?  ?  ? ?  ? ?Patient Active Problem List  ? Diagnosis Date Noted  ? Elevated troponin   ? History of stroke 05/05/2021  ? Labile blood glucose   ? Uncontrolled type 2 diabetes mellitus with hyperglycemia ([place])   ? Acute blood loss anemia   ? Debility 03/17/2021  ? Glaucoma 03/06/2021  ? At risk for medication noncompliance 03/06/2021  ? Physical deconditioning 01/05/2021  ? 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 Orthostatic hypotension 11/22/2020  ? Lower extremity weakness 11/22/2020  ? CKD (chronic kidney disease) stage 3, GFR 30-59 ml/min (HCC) 11/22/2020  ? Depression 08/22/2020  ? Weight loss, non-intentional 07/04/2020  ? Adult failure to thrive 07/04/2020  ? Hyperglycemia 06/15/2020  ? Early satiety 05/31/2020  ? PAF (paroxysmal atrial fibrillation) ([place]) 09/20/2018  ? Migraine   ? (HFpEF) heart failure with preserved ejection fraction ([place]) 01/01/2018  ? 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  ? Pseudophakia of right eye 09/16/2017  ? 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 Fatigue 04/02/2016  ? 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 Vitamin D deficiency 10/30/2014  ? Diabetic neuropathy ([place]) 05/13/2013  ? Healthcare maintenance 02/26/2011  ? Hyperlipidemia 05/05/2006  ? ?Conditions to be addressed/monitored per PCP order:  Chronic healthcare management needs, DM, HTN, migraines, depression, HLD, vision loss-left ? ?Care Plan : General Plan of Care (Adult)  ?Updates made by [name], RN since 07/11/2021 12:00 AM  ?  ? ?Problem: Health Promotion or Disease Self-Management (General Plan of Care) Resolved 07/11/2021  ?Priority: High  ?Onset Date: 08/29/2020  ?  ? ?Care Plan : General Plan of Care  (Adult)  ?Updates made by [name], RN since 07/11/2021 12:00 AM  ?  ? ?Problem: Health Promotion or Disease Self-Management (General Plan of Care)   ?Priority: High  ?Onset Date: 08/29/2020  ?  ? ?Long-Range Goal: Self-Management Plan Developed   ?Start Date: 08/29/2020  ?Expected End Date: 10/11/2021  ?Recent Progress: Not on track  ?Priority: High  ?Note:   ?Current Barriers:  ?Ineffective Self Health Maintenance ?Patient with diabetes, hypertension, deconditioning, depression ?Currently UNABLE TO independently self manage needs related to chronic health conditions.  ?Knowledge Deficits related to short term plan for care coordination needs and long term plans for chronic disease management needs ?07/11/21:  Per patient's Jennifer [name], patient is currently having PT.  Blood sugars in the 400s per patient's Jennifer [name]-she states she is following up with the provider.  ?Nurse Case Manager Clinical Goal(s):  ?patient will work with care management team to address care coordination and chronic disease management needs related to Disease Management ?Educational Needs ?Care Coordination ?Medication Management and Education ?Medication Reconciliation ?Medication Assistance  ?Psychosocial Support ?Mental Health Counseling   ?Interventions:  ?Evaluation of current treatment plan and patient's adherence to plan as established by provider. ?Reviewed medications with patient/patient's Jennifer [name]. ?  Collaborated with Pharmacy ?Pharmacy referral for medication review. ?Collaborated with Social Work regarding depression.  Referral for financial resources-needs rent assistance. ?Discussed plans with patient for ongoing care management follow up and provided patient with direct contact information for care management team ?Reviewed scheduled/upcoming provider appointments.  ?Social Work referral for depression. ?Patient's Jennifer [name] given [facility] outpatient PT phone number to call for an appointment. ?Collaborated with PCP regarding  blood sugars, PT. ?07/11/21:  Patient currently participating in PT ?Self Care Activities:  ?Patient will self administer medications as prescribed ?Patient will attend all scheduled provider appointments ?Patient/DPRw

## 2021-07-16 ENCOUNTER — Ambulatory Visit: Payer: Medicaid Other | Attending: Internal Medicine | Admitting: Physical Therapy

## 2021-07-16 ENCOUNTER — Other Ambulatory Visit: Payer: Self-pay

## 2021-07-16 ENCOUNTER — Encounter: Payer: Self-pay | Admitting: Physical Therapy

## 2021-07-16 ENCOUNTER — Ambulatory Visit: Payer: Medicaid Other | Admitting: Occupational Therapy

## 2021-07-16 VITALS — BP 128/72

## 2021-07-16 DIAGNOSIS — R2689 Other abnormalities of gait and mobility: Secondary | ICD-10-CM | POA: Diagnosis present

## 2021-07-16 DIAGNOSIS — R41842 Visuospatial deficit: Secondary | ICD-10-CM

## 2021-07-16 DIAGNOSIS — R2681 Unsteadiness on feet: Secondary | ICD-10-CM | POA: Diagnosis present

## 2021-07-16 DIAGNOSIS — M6281 Muscle weakness (generalized): Secondary | ICD-10-CM | POA: Diagnosis present

## 2021-07-16 DIAGNOSIS — R278 Other lack of coordination: Secondary | ICD-10-CM | POA: Diagnosis present

## 2021-07-16 DIAGNOSIS — I63419 Cerebral infarction due to embolism of unspecified middle cerebral artery: Secondary | ICD-10-CM | POA: Insufficient documentation

## 2021-07-16 NOTE — Therapy (Signed)
?OUTPATIENT PHYSICAL THERAPY NEURO EVALUATION ? ? ?Patient Name: Jennifer [name] ?MRN: [id number] ?DOB:October 07, 1970, 51 y.o., female ?Today's Date: 07/17/2021 ? ?PCP: Jose Persia, MD ?REFERRING PROVIDER: Izora Ribas, MD ? ? PT End of Session - 07/16/21 1629   ? ? Visit Number 1   ? Number of Visits 9   8+eval  ? Date for PT Re-Evaluation 09/14/21   ? Authorization Type [employer] [place] not required, 27 v combined   ? PT Start Time 1615   ? PT Stop Time 1700   ? PT Time Calculation (min) 45 min   ? Equipment Utilized During Treatment Gait belt   ? Activity Tolerance Patient tolerated treatment well   ? Behavior During Therapy [facility] for tasks assessed/performed   ? ?  ?  ? ?  ? ? ?Past Medical History:  ?Diagnosis Date  ? Acute lower GI bleeding 03/06/2021  ? Acute proctitis 03/06/2021  ? Anemia, iron deficiency   ? Atrial fibrillation ([place])   ? Blindness of left eye   ? Closed trimalleolar fracture of right ankle 05/02/2020  ? Added automatically from request for surgery [id number]  ? COVID-19 virus infection 06/15/2020  ? Depression   ? 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 Annotation: secondary to uveitis of unknown etiology Qualifier: Diagnosis of  By: Lazarus Salines, Beth    ? Hair loss   ? History of fracture of clavicle 05/18/2015  ? Hyperlipidemia   ? Hypertension   ? Hypertension associated with diabetes ([place]) 07/23/2016  ? Iron deficiency anemia 05/13/2013  ? Left anterior shoulder pain 07/06/2017  ? NSTEMI (non-ST elevated myocardial infarction) ([place]) 02/04/2021  ? Pap smear abnormality of cervix with LGSIL   ? Routine/ritual circumcision   ? Type II diabetes mellitus ([place])   ? Uveitis   ? ?Past Surgical History:  ?Procedure Laterality Date  ? CESAREAN SECTION    ? x 1  ? EYE SURGERY Bilateral   ? Lasik  ? LEFT HEART CATH AND CORONARY ANGIOGRAPHY N/A 02/05/2021  ? Procedure: LEFT HEART CATH AND CORONARY ANGIOGRAPHY;  Surgeon: Dixie Dials, MD;  Location: [facility];   Service: Cardiovascular;  Laterality: N/A;  ? MULTIPLE TOOTH EXTRACTIONS    ? bottom denture  ? ORIF ANKLE FRACTURE Right 05/05/2020  ? Procedure: OPEN REDUCTION INTERNAL FIXATION (ORIF) ANKLE FRACTURE;  Surgeon: Shona Needles, MD;  Location: [place];  Service: Orthopedics;  Laterality: Right;  ? ?Patient Active Problem List  ? Diagnosis Date Noted  ? Elevated troponin   ? History of stroke 05/05/2021  ? Labile blood glucose   ? Uncontrolled type 2 diabetes mellitus with hyperglycemia ([place])   ? Acute blood loss anemia   ? Debility 03/17/2021  ? Glaucoma 03/06/2021  ? At risk for medication noncompliance 03/06/2021  ? Physical deconditioning 01/05/2021  ? 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 Orthostatic hypotension 11/22/2020  ? Lower extremity weakness 11/22/2020  ? CKD (chronic kidney disease) stage 3, GFR 30-59 ml/min (HCC) 11/22/2020  ? Depression 08/22/2020  ? Weight loss, non-intentional 07/04/2020  ? Adult failure to thrive 07/04/2020  ? Hyperglycemia 06/15/2020  ? Early satiety 05/31/2020  ? PAF (paroxysmal atrial fibrillation) ([place]) 09/20/2018  ? Migraine   ? (HFpEF) heart failure with preserved ejection fraction ([place]) 01/01/2018  ? 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  ? Pseudophakia of right eye 09/16/2017  ? 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 Fatigue 04/02/2016  ? Uveitic glaucoma  of left eye, 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  ? Vitamin D deficiency 10/30/2014  ? Diabetic neuropathy ([place]) 05/13/2013  ? Healthcare maintenance 02/26/2011  ? Hyperlipidemia 05/05/2006  ? ? ?ONSET DATE: 07/10/2021  ? 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 ?THERAPY DIAG:  ?Other abnormalities of gait and mobility ? ?Muscle weakness (generalized) ? ?Other lack of coordination ? ?SUBJECTIVE:  ?                                                                                                                                                                                            ? ?SUBJECTIVE STATEMENT: ?Pt states her legs began giving her difficulty over a year ago.  I can walk only 10-15 minutes with a walker.  "I believe the anesthesia from a surgery affected my legs." ?Pt accompanied by: self, family member, and interpreter: Blair Heys -son Marcy Siren ? ?PERTINENT HISTORY: atrial fibrillation on Eliquis, NSTEMI in October 2022, left eye visual impairment/glaucoma, HTN, HLD, DM2 w/ recent ED visit for hyperglycemia, admission in November for multifocal CVA, CKD stage 3, Closed trimalleolar fx of R ankle (05/02/2020), Clavicle fx (02/12/08) ? ?PAIN:  ?Are you having pain? No ? ?PRECAUTIONS: Fall ? ?WEIGHT BEARING RESTRICTIONS No ? ?FALLS: Has patient fallen in last 6 months? Yes. Number of falls 4-5 ? ?LIVING ENVIRONMENT: ?Lives with: lives with their spouse and lives with their son ?Lives in: House/apartment ?Stairs: No ?Has following equipment at home: Gilford Rile - 2 wheeled and Wheelchair (manual) ? ?PLOF: Needs assistance with ADLs, Needs assistance with homemaking, Needs assistance with gait, and Needs assistance with transfers ? ?PATIENT GOALS "I don't like the fact that I cannot walk.  I feel like my legs don't work right." ? ?OBJECTIVE:  ? ?DIAGNOSTIC FINDINGS: Per imaging from 06/04/2021:  "No acute intracranial hemorrhage or evidence of acute infarct. ?  ?Known lesions within the left occipital lobe, grossly unchanged in ?extent from the recent prior head CT of 05/16/2021. These lesions ?were more fully characterized on the prior brain MRI examinations of ?02/28/2021 and 04/27/2021. Please refer to these prior reports for ?further description and for differential considerations. On today's ?exam, there appears to be developing encephalomalacia/gliosis and ?cortical laminar necrosis at these sites. ?  ?Background mild chronic small vessel ischemic changes within the ?cerebral white matter. ?   ?Mild generalized parenchymal atrophy. ?  ?Paranasal sinus disease at the imaged levels, as described." ? ?COGNITION: ?Overall cognitive status: Within functional limits for tasks assessed ?  ?SENSATION: ?Light touch: WFL ?Can feel more sensation stronger on the  Right side. ?COORDINATION: ?Too weak to formally assess. ? ?EDEMA:  ?None noted. ? ?MUSCLE TONE: None noted. ? ? ?POSTURE: rounded shoulders, forward head, and posterior pelvic tilt ? ?LE ROM:    ? ?Active  Right ?07/17/2021 Left ?07/17/2021  ?Hip flexion Minor initiation of ROM w/o shoe Partial ROM w/o shoe  ?Hip extension    ?Hip abduction [facility] WFL  ?Hip adduction [facility] WFL  ?Hip internal rotation    ?Hip external rotation    ?Knee flexion    ?Knee extension [facility] WFL  ?Ankle dorsiflexion [facility] WFL  ?Ankle plantarflexion [facility] WFL  ?Ankle inversion    ?Ankle eversion    ? (Blank rows = not tested) ? ?MMT:   ? ?MMT Right ?07/17/2021 Left ?07/17/2021  ?Hip flexion 2-/5 2/5  ?Hip extension    ?Hip abduction 3+/5 3+/5  ?Hip adduction 3/5 3/5  ?Hip internal rotation    ?Hip external rotation    ?Knee flexion 3/5 3/5  ?Knee extension 3/5 3/5  ?Ankle dorsiflexion 2/5 2-/5  ?Ankle plantarflexion 2-/5 2-/5  ?Ankle inversion    ?Ankle eversion    ?(Blank rows = not tested) ? ?TRANSFERS: ?Assistive device utilized: Environmental consultant - 2 wheeled  ?Sit to stand: Min A ?Stand to sit: Min A ?Chair to chair: Min A ? ?GAIT: ?Comments: Pt unwilling to walk due to weight of shoes. ? ?FUNCTIONAL TESTs:  ?5 times sit to stand: Pt unable to stand repeatedly. ?Timed up and go (TUG): Pt did not wish to complete test this session due to shoes. ? ?PATIENT SURVEYS:  ?FOTO score to be obtained with goal set next session. ? ?TODAY'S TREATMENT:  ?N/A ? ? ?PATIENT EDUCATION: ?Education details: PT POC, assessments, goals set. ?Person educated: Patient and Child(ren) ?Education method: Explanation ?Education comprehension: verbalized understanding ? ? ?HOME EXERCISE PROGRAM: ?To be  initiated. ? ? ? ?GOALS: ?Goals reviewed with patient? Yes ? ?STGs=LTGs ? ?LONG TERM GOALS: Target date:  09/14/2021 ? ?Pt will be independent with strength and balance HEP with supervision from family. ?Baseline: Not initiated ?Goal

## 2021-07-16 NOTE — Therapy (Signed)
?OUTPATIENT OCCUPATIONAL THERAPY NEURO EVALUATION ? ?Patient Name: Jennifer [name] ?MRN: [id number] ?DOB:[birth year], 51 y.o., female ?Today's Date: 07/16/2021 ? ?PCP: Jose Persia, MD ?REFERRING PROVIDER: Izora Ribas, MD ? ? OT End of Session - 07/16/21 1634   ? ? Visit Number 1   ? Number of Visits 9   ? Date for OT Re-Evaluation 09/10/21   ? Authorization Type [employer] Medicaid   ? Authorization Time Period $4 copay - 27 visit limit   ? OT Start Time 1530   ? OT Stop Time 1615   ? OT Time Calculation (min) 45 min   ? Behavior During Therapy [facility] for tasks assessed/performed   ? ?  ?  ? ?  ? ? ?Past Medical History:  ?Diagnosis Date  ? Acute lower GI bleeding 03/06/2021  ? Acute proctitis 03/06/2021  ? Anemia, iron deficiency   ? Atrial fibrillation ([place])   ? Blindness of left eye   ? Closed trimalleolar fracture of right ankle 05/02/2020  ? Added automatically from request for surgery [phone]  ? COVID-19 virus infection 06/15/2020  ? Depression   ? 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 Annotation: secondary to uveitis of unknown etiology Qualifier: Diagnosis of  By: Lazarus Salines, Beth    ? Hair loss   ? History of fracture of clavicle 05/18/2015  ? Hyperlipidemia   ? Hypertension   ? Hypertension associated with diabetes ([place]) 07/23/2016  ? Iron deficiency anemia 05/13/2013  ? Left anterior shoulder pain 07/06/2017  ? NSTEMI (non-ST elevated myocardial infarction) ([place]) 02/04/2021  ? Pap smear abnormality of cervix with LGSIL   ? Routine/ritual circumcision   ? Type II diabetes mellitus ([place])   ? Uveitis   ? ?Past Surgical History:  ?Procedure Laterality Date  ? CESAREAN SECTION    ? x 1  ? EYE SURGERY Bilateral   ? Lasik  ? LEFT HEART CATH AND CORONARY ANGIOGRAPHY N/A 02/05/2021  ? Procedure: LEFT HEART CATH AND CORONARY ANGIOGRAPHY;  Surgeon: Dixie Dials, MD;  Location: [facility];  Service: Cardiovascular;  Laterality: N/A;  ? MULTIPLE TOOTH EXTRACTIONS    ? bottom denture   ? ORIF ANKLE FRACTURE Right 05/05/2020  ? Procedure: OPEN REDUCTION INTERNAL FIXATION (ORIF) ANKLE FRACTURE;  Surgeon: Shona Needles, MD;  Location: [facility];  Service: Orthopedics;  Laterality: Right;  ? ?Patient Active Problem List  ? Diagnosis Date Noted  ? Elevated troponin   ? History of stroke 05/05/2021  ? Labile blood glucose   ? Uncontrolled type 2 diabetes mellitus with hyperglycemia ([place])   ? Acute blood loss anemia   ? Debility 03/17/2021  ? Glaucoma 03/06/2021  ? At risk for medication noncompliance 03/06/2021  ? Physical deconditioning 01/05/2021  ? 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 Orthostatic hypotension 11/22/2020  ? Lower extremity weakness 11/22/2020  ? CKD (chronic kidney disease) stage 3, GFR 30-59 ml/min (HCC) 11/22/2020  ? Depression 08/22/2020  ? Weight loss, non-intentional 07/04/2020  ? Adult failure to thrive 07/04/2020  ? Hyperglycemia 06/15/2020  ? Early satiety 05/31/2020  ? PAF (paroxysmal atrial fibrillation) ([place]) 09/20/2018  ? Migraine   ? (HFpEF) heart failure with preserved ejection fraction ([place]) 01/01/2018  ? 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  ? Pseudophakia of right eye 09/16/2017  ? 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 Fatigue 04/02/2016  ? 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 Vitamin D deficiency 10/30/2014  ?  Diabetic neuropathy ([place]) 05/13/2013  ? Healthcare maintenance 02/26/2011  ? Hyperlipidemia 05/05/2006  ? ? ?ONSET DATE: 05/16/21 ? ?REFERRING DIAG: I63.419 (ICD-10-CM) - Cerebrovascular accident (CVA) due to embolism of middle cerebral artery, unspecified blood vessel laterality ([place])  ? ?THERAPY DIAG:  ?Unsteadiness on feet ? ?Muscle weakness (generalized) ? ?Other lack of coordination ? ?Visuospatial deficit ? ?SUBJECTIVE:  ? ?SUBJECTIVE STATEMENT: ?[name] presenting to Neuro OPOT s/p CVA on 1/25.   ?Pt accompanied by: family member and interpreter: son    ? ?PERTINENT HISTORY: PMH: chronic  diastolic heart failure, insulin-dependent type 2 diabetes, Chronic healthcare management needs, DM, HTN, migraines, depression, HLD, vision loss-left d/t glaucoma ? ?PRECAUTIONS: Fall  ? ?WEIGHT BEARING RESTRICTIONS No ? ?PAIN:  ?Are you having pain? Yes: NPRS scale: N/A/10 ?Pain location: BUE Hands - all digits ?Pain description: Numbness ?Aggravating factors:   ?Relieving factors:   ? ?FALLS: Has patient fallen in last 6 months? Yes, Number of falls: a lot ? ?LIVING ENVIRONMENT: ?Lives with: lives with their family, lives with their spouse, lives with their son, and lives with their daughter husband and kids - 3 boys, 1 girls, no pets ?Lives in: Other apartment ?Stairs:  one level apartment - 1st floor ?Has following equipment at home: Gilford Rile - 2 wheeled, Wheelchair (manual), and bed side commode ? ?PLOF: Needs assistance with ADLs, Needs assistance with homemaking, Needs assistance with gait, Needs assistance with transfers, Vocation/Vocational requirements: disability , and Leisure: cleaning the house ?D/t right ankle fx from approx 1 year ago - decrease in ability to complete ADLs and ambulation since CVA. ? ? ?PATIENT GOALS "I wanna walk mainly, I can walk with the walker but if it's not there I will fall" ? ?OBJECTIVE:  ? ?DIAGNOSTIC FINDINGS: ?1. Stable small left occipital brain lesion. ?2. No acute intracranial findings. ? ?HAND DOMINANCE: Right ? ?ADLs: ?Overall ADLs: receives some assistance from family members - uses RW ?Transfers/ambulation related to ADLs: ?Eating: able to cut food but not with meal prep ?Grooming: Mod I ?UB Dressing: Independent for shirt - not wearing bra ?LB Dressing: Min A  ?Toileting: Mod I ?Bathing: Mod I ?Tub Shower transfers: min A with assistance for RLE ?Equipment:  uses folding chair in shower ? ? ?IADLs: ?Shopping: N/A - not driving ?Light housekeeping: pt reports doing a little bit currently -  ?Meal Prep: Pt not completing at this time. ?Community mobility: Relies on  family and friends ?Medication management: Independent ?Financial management: Family assisting ?Handwriting:  did not assess ? ?MOBILITY STATUS: Needs Assist: uses RW however came into evaluation with manual chair from facility  ? ?POSTURE COMMENTS:  ?rounded shoulders and forward head ? ?ACTIVITY TOLERANCE: ?Activity tolerance: poor - did not assess ? ?FUNCTIONAL OUTCOME MEASURES: ?FOTO: 60 - anticipated at discharge 41 ? ?UE ROM   : WFL for tasks performed ? ? ?UE MMT:  did not get to at evaluation. WFL For tasks performed ? ?HAND FUNCTION: ?Grip strength: Right: 26 lbs; Left: 23.5 lbs ? ?COORDINATION: ?9 Hole Peg test: Right: 61.84 sec; Left: 79.85 sec ? ?SENSATION: pt reports numbness in BUE hands and digits however intact to light touch ?Light touch: WFL ?Stereognosis: Not tested ?Hot/Cold: WFL ?Proprioception: Not tested ? ?EDEMA: None ? ? ?COGNITION: ?Overall cognitive status: Difficulty to assess d/t language barrier  ? ?VISION: ?Subjective report: history of glaucoma  ?Baseline vision: Legally blind in left eye approx 5 years ?Visual history: glaucoma ? ?VISION ASSESSMENT: report vision blacked out  at time of CVA but resolved. ?WFL ? ? ?PERCEPTION: Not tested - none observed ? ?PRAXIS: Not tested - none observed ? ?OBSERVATIONS: Pt weak and has lost significant amount of weight recently.  ? ? ?TODAY'S TREATMENT:  ?None 07/16/21 ? ? ?PATIENT EDUCATION: ?Education details: Education on role and purpose of OT ?Person educated: Patient and Child(ren) ?Education method: Explanation ?Education comprehension: verbalized understanding and needs further education ? ? ? ? ?GOALS: ?Goals reviewed with patient? No ? ?SHORT TERM GOALS: Target date: 08/13/2021 ? ?Pt will be independent with HEP targeting coordination and BUE grip strength ?Baseline: not issued at eval ?Goal status: INITIAL ? ?2.  Pt will verbalize understanding of adapted strategies and/or equipment PRN to increase safety and independence with ADLs and  IADLs (I.e. cutting up food, etc) ?Baseline: did not address at eval ?Goal status: INITIAL ? ? ? ?LONG TERM GOALS: Target date: 09/10/2021 ? ?Pt will increase grip strength in BUE by 5 lbs or greater. ?Clarise Cruz

## 2021-07-23 ENCOUNTER — Ambulatory Visit: Payer: Medicaid Other | Admitting: Occupational Therapy

## 2021-07-23 ENCOUNTER — Ambulatory Visit: Payer: Medicaid Other | Attending: Internal Medicine | Admitting: Physical Therapy

## 2021-07-25 ENCOUNTER — Other Ambulatory Visit: Payer: Self-pay

## 2021-07-25 ENCOUNTER — Emergency Department (HOSPITAL_COMMUNITY): Payer: Medicaid Other

## 2021-07-25 ENCOUNTER — Observation Stay (HOSPITAL_COMMUNITY)
Admission: EM | Admit: 2021-07-25 | Discharge: 2021-07-28 | Disposition: A | Discharge status: Home / Self Care | Payer: Medicaid Other | Attending: Osteopathic Medicine | Admitting: Osteopathic Medicine

## 2021-07-25 DIAGNOSIS — D696 Thrombocytopenia, unspecified: Secondary | ICD-10-CM | POA: Insufficient documentation

## 2021-07-25 DIAGNOSIS — Z20822 Contact with and (suspected) exposure to covid-19: Secondary | ICD-10-CM | POA: Diagnosis not present

## 2021-07-25 DIAGNOSIS — I480 Paroxysmal atrial fibrillation: Secondary | ICD-10-CM | POA: Insufficient documentation

## 2021-07-25 DIAGNOSIS — N183 Chronic kidney disease, stage 3 unspecified: Secondary | ICD-10-CM | POA: Diagnosis not present

## 2021-07-25 DIAGNOSIS — Z7984 Long term (current) use of oral hypoglycemic drugs: Secondary | ICD-10-CM | POA: Diagnosis not present

## 2021-07-25 DIAGNOSIS — E1165 Type 2 diabetes mellitus with hyperglycemia: Secondary | ICD-10-CM | POA: Diagnosis not present

## 2021-07-25 DIAGNOSIS — I5030 Unspecified diastolic (congestive) heart failure: Secondary | ICD-10-CM | POA: Diagnosis not present

## 2021-07-25 DIAGNOSIS — I959 Hypotension, unspecified: Secondary | ICD-10-CM | POA: Insufficient documentation

## 2021-07-25 DIAGNOSIS — I2510 Atherosclerotic heart disease of native coronary artery without angina pectoris: Secondary | ICD-10-CM | POA: Insufficient documentation

## 2021-07-25 DIAGNOSIS — E1141 Type 2 diabetes mellitus with diabetic mononeuropathy: Secondary | ICD-10-CM | POA: Diagnosis not present

## 2021-07-25 DIAGNOSIS — R778 Other specified abnormalities of plasma proteins: Secondary | ICD-10-CM | POA: Insufficient documentation

## 2021-07-25 DIAGNOSIS — I130 Hypertensive heart and chronic kidney disease with heart failure and stage 1 through stage 4 chronic kidney disease, or unspecified chronic kidney disease: Secondary | ICD-10-CM | POA: Insufficient documentation

## 2021-07-25 DIAGNOSIS — H409 Unspecified glaucoma: Secondary | ICD-10-CM | POA: Diagnosis present

## 2021-07-25 DIAGNOSIS — Z8616 Personal history of COVID-19: Secondary | ICD-10-CM | POA: Diagnosis not present

## 2021-07-25 DIAGNOSIS — E1110 Type 2 diabetes mellitus with ketoacidosis without coma: Secondary | ICD-10-CM | POA: Diagnosis not present

## 2021-07-25 DIAGNOSIS — Z7901 Long term (current) use of anticoagulants: Secondary | ICD-10-CM | POA: Insufficient documentation

## 2021-07-25 DIAGNOSIS — E785 Hyperlipidemia, unspecified: Secondary | ICD-10-CM | POA: Diagnosis present

## 2021-07-25 DIAGNOSIS — R112 Nausea with vomiting, unspecified: Secondary | ICD-10-CM | POA: Diagnosis present

## 2021-07-25 DIAGNOSIS — I5189 Other ill-defined heart diseases: Secondary | ICD-10-CM

## 2021-07-25 DIAGNOSIS — Z794 Long term (current) use of insulin: Secondary | ICD-10-CM | POA: Diagnosis not present

## 2021-07-25 DIAGNOSIS — I4891 Unspecified atrial fibrillation: Secondary | ICD-10-CM

## 2021-07-25 DIAGNOSIS — E1140 Type 2 diabetes mellitus with diabetic neuropathy, unspecified: Secondary | ICD-10-CM | POA: Diagnosis present

## 2021-07-25 DIAGNOSIS — E0810 Diabetes mellitus due to underlying condition with ketoacidosis without coma: Secondary | ICD-10-CM

## 2021-07-25 LAB — CBC WITH DIFFERENTIAL/PLATELET
Abs Immature Granulocytes: 0.02 10*3/uL (ref 0.00–0.07)
Basophils Absolute: 0 10*3/uL (ref 0.0–0.1)
Basophils Relative: 0 %
Eosinophils Absolute: 0.1 10*3/uL (ref 0.0–0.5)
Eosinophils Relative: 1 %
HCT: 41.1 % (ref 36.0–46.0)
Hemoglobin: 12.8 g/dL (ref 12.0–15.0)
Immature Granulocytes: 0 %
Lymphocytes Relative: 16 %
Lymphs Abs: 0.9 10*3/uL (ref 0.7–4.0)
MCH: 26.4 pg (ref 26.0–34.0)
MCHC: 31.1 g/dL (ref 30.0–36.0)
MCV: 84.9 fL (ref 80.0–100.0)
Monocytes Absolute: 0.2 10*3/uL (ref 0.1–1.0)
Monocytes Relative: 3 %
Neutro Abs: 4.7 10*3/uL (ref 1.7–7.7)
Neutrophils Relative %: 80 %
Platelets: 139 10*3/uL — ABNORMAL LOW (ref 150–400)
RBC: 4.84 MIL/uL (ref 3.87–5.11)
RDW: 12.4 % (ref 11.5–15.5)
WBC: 5.9 10*3/uL (ref 4.0–10.5)
nRBC: 0 % (ref 0.0–0.2)

## 2021-07-25 LAB — CBG MONITORING, ED: Glucose-Capillary: 418 mg/dL — ABNORMAL HIGH (ref 70–99)

## 2021-07-25 MED ORDER — LACTATED RINGERS IV BOLUS
1000.0000 mL | Freq: Once | INTRAVENOUS | Status: AC
  Administered 2021-07-25: 1000 mL via INTRAVENOUS

## 2021-07-25 MED ORDER — DILTIAZEM HCL-DEXTROSE 125-5 MG/125ML-% IV SOLN (PREMIX)
5.0000 mg/h | INTRAVENOUS | Status: DC
  Administered 2021-07-25: 5 mg/h via INTRAVENOUS
  Filled 2021-07-25: qty 125

## 2021-07-25 NOTE — ED Triage Notes (Addendum)
Patient BIB EMS c/o N/V/D x 1 day . Per report pt CBG shows high and Initial BP was 70/30, HR 150's. Per report pt had multiple emesis and diarrhea at home. Pt hx of Afib with RVR and DM. ?

## 2021-07-25 NOTE — ED Notes (Signed)
Pt. CBG 418, RN made aware. ?

## 2021-07-25 NOTE — ED Provider Notes (Signed)
?[facility] DEPT ?Provider Note ? ? ?CSN: [id number] ?Arrival date & time: 07/25/21  2255 ? ?  ? ?History ? ?Chief Complaint  ?Patient presents with  ? Hyperglycemia  ? Hypotension  ? ? ?Jennifer [name] is a 51 y.o. female. ? ?The history is provided by the patient, the EMS personnel and medical records. A language interpreter was used.  ?Hyperglycemia ?Jennifer [name] is a 51 y.o. female who presents to the Emergency Department complaining of vomiting and diarrhea. She presents to the ED by EMS for evaluation of abrupt onset V and D that started around 2pm.  Reports associated central abdominal discomfort.  No hematemesis, hematochezia.  Has a hx/o afib, complaint with medications.  No associated CP, SOB, fever.  Sxs are severe and constant in nature.  EMS reports elevated blood sugar, hypotension prior to ED arrival.  ?  ? ?Home Medications ?Prior to Admission medications   ?Medication Sig Start Date End Date Taking? Authorizing Provider  ?acetaminophen (TYLENOL) 325 MG tablet Take 2 tablets (650 mg total) by mouth every 6 (six) hours as needed for mild pain (or Fever >/= 101). 04/03/21   Angiulli, Lavon Paganini, PA-C  ?apixaban (ELIQUIS) 5 MG TABS tablet Take 1 tablet (5 mg total) by mouth 2 (two) times daily. 04/03/21   Angiulli, Lavon Paganini, PA-C  ?brimonidine (ALPHAGAN) 0.2 % ophthalmic solution Place 1 drop into the left eye 3 (three) times daily. ?Patient taking differently: Place 1 drop into both eyes 3 (three) times daily. 04/03/21   Angiulli, Lavon Paganini, PA-C  ?calcium citrate-vitamin D (CITRACAL+D) 315-200 MG-UNIT tablet Take 2 tablets by mouth 2 (two) times daily. 04/03/21 04/03/22  Angiulli, Lavon Paganini, PA-C  ?dorzolamide-timolol (COSOPT) 22.3-6.8 MG/ML ophthalmic solution Place 1 drop into the left eye 2 (two) times daily. 04/03/21   Angiulli, Lavon Paganini, PA-C  ?gabapentin (NEURONTIN) 300 MG capsule Take 1 capsule (300 mg total) by mouth at bedtime. ?Patient taking differently: Take 300 mg  by mouth 3 (three) times daily. 04/03/21 04/03/22  Angiulli, Lavon Paganini, PA-C  ?insulin aspart (NOVOLOG FLEXPEN) 100 UNIT/ML FlexPen Inject 3 Units into the skin 3 (three) times daily with meals. 04/14/21   Meredith Staggers, MD  ?insulin detemir (LEVEMIR) 100 UNIT/ML FlexPen Inject 15 Units into the skin at bedtime. 05/29/21   Raulkar, Clide Deutscher, MD  ?Insulin Pen Needle (BD PEN NEEDLE NANO 2ND GEN) 32G X 4 MM MISC 1 each by Does not apply route as needed. 06/29/21   Raulkar, Clide Deutscher, MD  ?Insulin Pen Needle (PEN NEEDLES) 32G X 4 MM MISC Use 4 times daily to inject insulin. 06/27/21   Jose Persia, MD  ?linagliptin (TRADJENTA) 5 MG TABS tablet Take 1 tablet (5 mg total) by mouth daily. 04/03/21 05/16/21  Angiulli, Lavon Paganini, PA-C  ?lisinopril (ZESTRIL) 5 MG tablet Take 1 tablet (5 mg total) by mouth daily. ?Patient not taking: Reported on 07/16/2021 05/19/21   Madalyn Rob, MD  ?magnesium gluconate (MAGONATE) 500 MG tablet Take 0.5 tablets (250 mg total) by mouth at bedtime. 04/03/21   Angiulli, Lavon Paganini, PA-C  ?metoprolol succinate (TOPROL-XL) 50 MG 24 hr tablet Take 1 tablet (50 mg total) by mouth daily. Take with or immediately following a meal. ?Patient not taking: Reported on 07/16/2021 05/19/21   Madalyn Rob, MD  ?Multiple Vitamin (MULTIVITAMIN WITH MINERALS) TABS tablet Take 1 tablet by mouth daily. 03/18/21   Debbe Odea, MD  ?rosuvastatin (CRESTOR) 20 MG tablet Take 1 tablet (20 mg total) by mouth  daily. ?Patient not taking: Reported on 07/16/2021 05/05/21 05/05/22  Mitzi Hansen, MD  ?Continuous Blood Gluc Sensor (DEXCOM G6 SENSOR) MISC Use to check blood sugar at least 6 times a day ?Patient not taking: Reported on 08/29/2020 08/09/20 09/27/20  Harvie Heck, MD  ?Continuous Blood Gluc Transmit (DEXCOM G6 TRANSMITTER) MISC Use to check blood sugar at least 6 times a day ?Patient not taking: Reported on 08/29/2020 08/09/20 09/27/20  Harvie Heck, MD  ?   ? ?Allergies    ?Patient has no known allergies.   ? ?Review of  Systems   ?Review of Systems  ?All other systems reviewed and are negative. ? ?Physical Exam ?Updated Vital Signs ?BP 114/65   Pulse 89   Temp 97.6 ?F (36.4 ?C) (Oral)   Resp 15   Ht '5\' 6"'$  (1.676 m)   Wt 68 kg   LMP 08/21/2016 (Exact Date)   SpO2 99%   BMI 24.21 kg/m?  ?Physical Exam ?Vitals and nursing note reviewed.  ?Constitutional:   ?   General: She is in acute distress.  ?   Appearance: She is well-developed. She is ill-appearing.  ?HENT:  ?   Head: Normocephalic and atraumatic.  ?   Mouth/Throat:  ?   Mouth: Mucous membranes are dry.  ?Cardiovascular:  ?   Rate and Rhythm: Tachycardia present. Rhythm irregular.  ?   Heart sounds: No murmur heard. ?Pulmonary:  ?   Effort: Pulmonary effort is normal. No respiratory distress.  ?   Breath sounds: Normal breath sounds.  ?Abdominal:  ?   Palpations: Abdomen is soft.  ?   Tenderness: There is no guarding or rebound.  ?   Comments: Mild generalized abdominal tenderness  ?Musculoskeletal:     ?   General: No tenderness.  ?Skin: ?   General: Skin is warm and dry.  ?Neurological:  ?   Mental Status: She is alert and oriented to person, place, and time.  ?Psychiatric:     ?   Behavior: Behavior normal.  ? ? ?ED Results / Procedures / Treatments   ?Labs ?(all labs ordered are listed, but only abnormal results are displayed) ?Labs Reviewed  ?COMPREHENSIVE METABOLIC PANEL - Abnormal; Notable for the following components:  ?    Result Value  ? CO2 18 (*)   ? Glucose, Bld 472 (*)   ? BUN 38 (*)   ? Creatinine, Ser 1.30 (*)   ? Albumin 3.4 (*)   ? GFR, Estimated 50 (*)   ? All other components within normal limits  ?CBC WITH DIFFERENTIAL/PLATELET - Abnormal; Notable for the following components:  ? Platelets 139 (*)   ? All other components within normal limits  ?BLOOD GAS, VENOUS - Abnormal; Notable for the following components:  ? Acid-base deficit 4.7 (*)   ? All other components within normal limits  ?BETA-HYDROXYBUTYRIC ACID - Abnormal; Notable for the following  components:  ? Beta-Hydroxybutyric Acid 3.23 (*)   ? All other components within normal limits  ?HCG, QUANTITATIVE, PREGNANCY - Abnormal; Notable for the following components:  ? hCG, Beta Chain, Quant, S 6 (*)   ? All other components within normal limits  ?CBG MONITORING, ED - Abnormal; Notable for the following components:  ? Glucose-Capillary 418 (*)   ? All other components within normal limits  ?CBG MONITORING, ED - Abnormal; Notable for the following components:  ? Glucose-Capillary 454 (*)   ? All other components within normal limits  ?CBG MONITORING, ED - Abnormal; Notable for the  following components:  ? Glucose-Capillary 412 (*)   ? All other components within normal limits  ?CBG MONITORING, ED - Abnormal; Notable for the following components:  ? Glucose-Capillary 430 (*)   ? All other components within normal limits  ?CBG MONITORING, ED - Abnormal; Notable for the following components:  ? Glucose-Capillary 357 (*)   ? All other components within normal limits  ?TROPONIN I (HIGH SENSITIVITY) - Abnormal; Notable for the following components:  ? Troponin I (High Sensitivity) 22 (*)   ? All other components within normal limits  ?TROPONIN I (HIGH SENSITIVITY) - Abnormal; Notable for the following components:  ? Troponin I (High Sensitivity) 46 (*)   ? All other components within normal limits  ?RESP PANEL BY RT-PCR (FLU A&B, COVID) ARPGX2  ?LIPASE, BLOOD  ?MAGNESIUM  ?URINALYSIS, ROUTINE W REFLEX MICROSCOPIC  ?CBC WITH DIFFERENTIAL/PLATELET  ?URINALYSIS, ROUTINE W REFLEX MICROSCOPIC  ?PHOSPHORUS  ?MAGNESIUM  ?COMPREHENSIVE METABOLIC PANEL  ?CBC WITH DIFFERENTIAL/PLATELET  ?BASIC METABOLIC PANEL  ? ? ?EKG ?EKG Interpretation ? ?Date/Time:  Thursday July 26 2021 00:57:45 EDT ?Ventricular Rate:  95 ?PR Interval:  152 ?QRS Duration: 111 ?QT Interval:  375 ?QTC Calculation: 472 ?R Axis:   -10 ?Text Interpretation: Sinus rhythm Abnormal R-wave progression, early transition Baseline wander in lead(s) II III aVR  aVF Confirmed by Quintella Reichert [phone]) on 07/26/2021 1:14:49 AM ? ?Radiology ?DG Chest Port 1 View ? ?Result Date: 07/25/2021 ?CLINICAL DATA:  Vomiting and hypotension. EXAM: PORTABLE CHEST 1 VIEW COMPARISON:  [place]

## 2021-07-26 ENCOUNTER — Encounter (HOSPITAL_COMMUNITY): Payer: Self-pay | Admitting: Internal Medicine

## 2021-07-26 DIAGNOSIS — N1831 Chronic kidney disease, stage 3a: Secondary | ICD-10-CM

## 2021-07-26 DIAGNOSIS — I480 Paroxysmal atrial fibrillation: Secondary | ICD-10-CM | POA: Diagnosis not present

## 2021-07-26 DIAGNOSIS — I5189 Other ill-defined heart diseases: Secondary | ICD-10-CM

## 2021-07-26 DIAGNOSIS — D696 Thrombocytopenia, unspecified: Secondary | ICD-10-CM

## 2021-07-26 DIAGNOSIS — R778 Other specified abnormalities of plasma proteins: Secondary | ICD-10-CM

## 2021-07-26 DIAGNOSIS — E1110 Type 2 diabetes mellitus with ketoacidosis without coma: Secondary | ICD-10-CM | POA: Diagnosis present

## 2021-07-26 LAB — CBG MONITORING, ED
Glucose-Capillary: 320 mg/dL — ABNORMAL HIGH (ref 70–99)
Glucose-Capillary: 357 mg/dL — ABNORMAL HIGH (ref 70–99)
Glucose-Capillary: 412 mg/dL — ABNORMAL HIGH (ref 70–99)
Glucose-Capillary: 430 mg/dL — ABNORMAL HIGH (ref 70–99)
Glucose-Capillary: 454 mg/dL — ABNORMAL HIGH (ref 70–99)

## 2021-07-26 LAB — URINALYSIS, ROUTINE W REFLEX MICROSCOPIC
Bilirubin Urine: NEGATIVE
Glucose, UA: >=500 mg/dL — AB
Ketones, ur: 20 mg/dL — AB
Nitrite: NEGATIVE
Protein, ur: 100 mg/dL — AB
Specific Gravity, Urine: 1.018 (ref 1.005–1.030)
WBC, UA: >50 WBC/hpf — ABNORMAL HIGH (ref 0–5)
pH: 5 (ref 5.0–8.0)

## 2021-07-26 LAB — COMPREHENSIVE METABOLIC PANEL
ALT: 13 U/L (ref 0–44)
ALT: 14 U/L (ref 0–44)
AST: 19 U/L (ref 15–41)
AST: 21 U/L (ref 15–41)
Albumin: 2.9 g/dL — ABNORMAL LOW (ref 3.5–5.0)
Albumin: 3.4 g/dL — ABNORMAL LOW (ref 3.5–5.0)
Alkaline Phosphatase: 66 U/L (ref 38–126)
Alkaline Phosphatase: 80 U/L (ref 38–126)
Anion gap: 14 (ref 5–15)
Anion gap: 8 (ref 5–15)
BUN: 36 mg/dL — ABNORMAL HIGH (ref 6–20)
BUN: 38 mg/dL — ABNORMAL HIGH (ref 6–20)
CO2: 18 mmol/L — ABNORMAL LOW (ref 22–32)
CO2: 25 mmol/L (ref 22–32)
Calcium: 9.1 mg/dL (ref 8.9–10.3)
Calcium: 9.3 mg/dL (ref 8.9–10.3)
Chloride: 104 mmol/L (ref 98–111)
Chloride: 105 mmol/L (ref 98–111)
Creatinine, Ser: 1.16 mg/dL — ABNORMAL HIGH (ref 0.44–1.00)
Creatinine, Ser: 1.3 mg/dL — ABNORMAL HIGH (ref 0.44–1.00)
GFR, Estimated: 50 mL/min — ABNORMAL LOW (ref >60)
GFR, Estimated: 57 mL/min — ABNORMAL LOW (ref >60)
Glucose, Bld: 364 mg/dL — ABNORMAL HIGH (ref 70–99)
Glucose, Bld: 472 mg/dL — ABNORMAL HIGH (ref 70–99)
Potassium: 3.9 mmol/L (ref 3.5–5.1)
Potassium: 4.4 mmol/L (ref 3.5–5.1)
Sodium: 136 mmol/L (ref 135–145)
Sodium: 138 mmol/L (ref 135–145)
Total Bilirubin: 0.5 mg/dL (ref 0.3–1.2)
Total Bilirubin: <0.1 mg/dL — ABNORMAL LOW (ref 0.3–1.2)
Total Protein: 5.7 g/dL — ABNORMAL LOW (ref 6.5–8.1)
Total Protein: 7 g/dL (ref 6.5–8.1)

## 2021-07-26 LAB — CBC WITH DIFFERENTIAL/PLATELET
Abs Immature Granulocytes: 0.01 10*3/uL (ref 0.00–0.07)
Basophils Absolute: 0 10*3/uL (ref 0.0–0.1)
Basophils Relative: 0 %
Eosinophils Absolute: 0 10*3/uL (ref 0.0–0.5)
Eosinophils Relative: 1 %
HCT: 36.1 % (ref 36.0–46.0)
Hemoglobin: 11.7 g/dL — ABNORMAL LOW (ref 12.0–15.0)
Immature Granulocytes: 0 %
Lymphocytes Relative: 11 %
Lymphs Abs: 0.5 10*3/uL — ABNORMAL LOW (ref 0.7–4.0)
MCH: 27 pg (ref 26.0–34.0)
MCHC: 32.4 g/dL (ref 30.0–36.0)
MCV: 83.2 fL (ref 80.0–100.0)
Monocytes Absolute: 0.1 10*3/uL (ref 0.1–1.0)
Monocytes Relative: 2 %
Neutro Abs: 4.1 10*3/uL (ref 1.7–7.7)
Neutrophils Relative %: 86 %
Platelets: 132 10*3/uL — ABNORMAL LOW (ref 150–400)
RBC: 4.34 MIL/uL (ref 3.87–5.11)
RDW: 12.6 % (ref 11.5–15.5)
WBC: 4.7 10*3/uL (ref 4.0–10.5)
nRBC: 0 % (ref 0.0–0.2)

## 2021-07-26 LAB — BLOOD GAS, VENOUS
Acid-base deficit: 4.7 mmol/L — ABNORMAL HIGH (ref 0.0–2.0)
Bicarbonate: 22.5 mmol/L (ref 20.0–28.0)
O2 Saturation: 74.3 %
Patient temperature: 37
pCO2, Ven: 49 mmHg (ref 44–60)
pH, Ven: 7.27 (ref 7.25–7.43)
pO2, Ven: 43 mmHg (ref 32–45)

## 2021-07-26 LAB — BASIC METABOLIC PANEL
Anion gap: 8 (ref 5–15)
BUN: 35 mg/dL — ABNORMAL HIGH (ref 6–20)
CO2: 22 mmol/L (ref 22–32)
Calcium: 9 mg/dL (ref 8.9–10.3)
Chloride: 106 mmol/L (ref 98–111)
Creatinine, Ser: 0.98 mg/dL (ref 0.44–1.00)
GFR, Estimated: >60 mL/min (ref >60)
Glucose, Bld: 166 mg/dL — ABNORMAL HIGH (ref 70–99)
Potassium: 4.6 mmol/L (ref 3.5–5.1)
Sodium: 136 mmol/L (ref 135–145)

## 2021-07-26 LAB — GLUCOSE, CAPILLARY
Glucose-Capillary: 286 mg/dL — ABNORMAL HIGH (ref 70–99)
Glucose-Capillary: 247 mg/dL — ABNORMAL HIGH (ref 70–99)
Glucose-Capillary: 173 mg/dL — ABNORMAL HIGH (ref 70–99)
Glucose-Capillary: 150 mg/dL — ABNORMAL HIGH (ref 70–99)
Glucose-Capillary: 152 mg/dL — ABNORMAL HIGH (ref 70–99)
Glucose-Capillary: 150 mg/dL — ABNORMAL HIGH (ref 70–99)
Glucose-Capillary: 163 mg/dL — ABNORMAL HIGH (ref 70–99)
Glucose-Capillary: 91 mg/dL (ref 70–99)
Glucose-Capillary: 88 mg/dL (ref 70–99)
Glucose-Capillary: 91 mg/dL (ref 70–99)

## 2021-07-26 LAB — TROPONIN I (HIGH SENSITIVITY)
Troponin I (High Sensitivity): 22 ng/L — ABNORMAL HIGH (ref <18)
Troponin I (High Sensitivity): 46 ng/L — ABNORMAL HIGH (ref <18)

## 2021-07-26 LAB — HCG, QUANTITATIVE, PREGNANCY: hCG, Beta Chain, Quant, S: 6 m[IU]/mL — ABNORMAL HIGH (ref <5)

## 2021-07-26 LAB — MAGNESIUM
Magnesium: 1.9 mg/dL (ref 1.7–2.4)
Magnesium: 2.2 mg/dL (ref 1.7–2.4)

## 2021-07-26 LAB — PHOSPHORUS: Phosphorus: 3.6 mg/dL (ref 2.5–4.6)

## 2021-07-26 LAB — MRSA NEXT GEN BY PCR, NASAL: MRSA by PCR Next Gen: NOT DETECTED

## 2021-07-26 LAB — RESP PANEL BY RT-PCR (FLU A&B, COVID) ARPGX2
Influenza A by PCR: NEGATIVE
Influenza B by PCR: NEGATIVE
SARS Coronavirus 2 by RT PCR: NEGATIVE

## 2021-07-26 LAB — BETA-HYDROXYBUTYRIC ACID: Beta-Hydroxybutyric Acid: 3.23 mmol/L — ABNORMAL HIGH (ref 0.05–0.27)

## 2021-07-26 LAB — LIPASE, BLOOD: Lipase: 25 U/L (ref 11–51)

## 2021-07-26 MED ORDER — LACTATED RINGERS IV SOLN: INTRAVENOUS | Status: DC

## 2021-07-26 MED ORDER — METOPROLOL SUCCINATE ER 50 MG PO TB24
50.0000 mg | ORAL_TABLET | Freq: Every day | ORAL | Status: DC
  Administered 2021-07-27 – 2021-07-28 (×2): 50 mg via ORAL
  Filled 2021-07-26 (×2): qty 2
  Filled 2021-07-26: qty 1

## 2021-07-26 MED ORDER — ACETAMINOPHEN 325 MG PO TABS: 650.0000 mg | ORAL_TABLET | Freq: Four times a day (QID) | ORAL | Status: DC | PRN

## 2021-07-26 MED ORDER — LACTATED RINGERS IV BOLUS
1000.0000 mL | Freq: Once | INTRAVENOUS | Status: AC
Start: 2021-07-26 — End: 2021-07-26
  Administered 2021-07-26: 1000 mL via INTRAVENOUS

## 2021-07-26 MED ORDER — ACETAMINOPHEN 650 MG RE SUPP: 650.0000 mg | Freq: Four times a day (QID) | RECTAL | Status: DC | PRN

## 2021-07-26 MED ORDER — DEXTROSE 50 % IV SOLN: 0.0000 mL | INTRAVENOUS | Status: DC | PRN

## 2021-07-26 MED ORDER — DORZOLAMIDE HCL-TIMOLOL MAL 2-0.5 % OP SOLN
1.0000 [drp] | Freq: Two times a day (BID) | OPHTHALMIC | Status: DC
  Administered 2021-07-26 – 2021-07-28 (×5): 1 [drp] via OPHTHALMIC
  Filled 2021-07-26: qty 10

## 2021-07-26 MED ORDER — INSULIN REGULAR(HUMAN) IN NACL 100-0.9 UT/100ML-% IV SOLN
INTRAVENOUS | Status: DC
  Administered 2021-07-26: 4.4 [IU]/h via INTRAVENOUS
  Filled 2021-07-26: qty 100

## 2021-07-26 MED ORDER — POTASSIUM CHLORIDE CRYS ER 20 MEQ PO TBCR
20.0000 meq | EXTENDED_RELEASE_TABLET | Freq: Once | ORAL | Status: DC
  Administered 2021-07-26: 20 meq via ORAL
  Filled 2021-07-26: qty 1

## 2021-07-26 MED ORDER — INSULIN DETEMIR 100 UNIT/ML ~~LOC~~ SOLN
15.0000 [IU] | Freq: Every day | SUBCUTANEOUS | Status: DC
  Administered 2021-07-26 – 2021-07-28 (×3): 15 [IU] via SUBCUTANEOUS
  Filled 2021-07-26 (×3): qty 0.15

## 2021-07-26 MED ORDER — MAGNESIUM GLUCONATE 500 MG PO TABS
250.0000 mg | ORAL_TABLET | Freq: Every day | ORAL | Status: DC
  Administered 2021-07-26 – 2021-07-27 (×2): 250 mg via ORAL
  Filled 2021-07-26 (×2): qty 1

## 2021-07-26 MED ORDER — GABAPENTIN 300 MG PO CAPS
300.0000 mg | ORAL_CAPSULE | Freq: Every day | ORAL | Status: DC
  Administered 2021-07-26 – 2021-07-27 (×2): 300 mg via ORAL
  Filled 2021-07-26 (×2): qty 1

## 2021-07-26 MED ORDER — INSULIN ASPART 100 UNIT/ML IJ SOLN
0.0000 [IU] | Freq: Three times a day (TID) | INTRAMUSCULAR | Status: DC
  Administered 2021-07-26 – 2021-07-27 (×2): 3 [IU] via SUBCUTANEOUS
  Administered 2021-07-27 (×2): 2 [IU] via SUBCUTANEOUS
  Administered 2021-07-28: 5 [IU] via SUBCUTANEOUS
  Administered 2021-07-28: 2 [IU] via SUBCUTANEOUS

## 2021-07-26 MED ORDER — SODIUM CHLORIDE 0.45 % IV SOLN
INTRAVENOUS | Status: DC
  Filled 2021-07-26 (×2): qty 1000

## 2021-07-26 MED ORDER — ONDANSETRON HCL 4 MG/2ML IJ SOLN: 4.0000 mg | Freq: Four times a day (QID) | INTRAMUSCULAR | Status: DC | PRN

## 2021-07-26 MED ORDER — KCL IN DEXTROSE-NACL 10-5-0.45 MEQ/L-%-% IV SOLN
INTRAVENOUS | Status: DC
Start: 2021-07-26 — End: 2021-07-27
  Filled 2021-07-26 (×3): qty 1000

## 2021-07-26 MED ORDER — OYSTER SHELL CALCIUM/D3 500-5 MG-MCG PO TABS
2.0000 | ORAL_TABLET | Freq: Two times a day (BID) | ORAL | Status: DC
  Administered 2021-07-26 – 2021-07-28 (×5): 2 via ORAL
  Filled 2021-07-26 (×4): qty 2

## 2021-07-26 MED ORDER — APIXABAN 5 MG PO TABS
5.0000 mg | ORAL_TABLET | Freq: Two times a day (BID) | ORAL | Status: DC
  Administered 2021-07-26 – 2021-07-28 (×5): 5 mg via ORAL
  Filled 2021-07-26 (×5): qty 1

## 2021-07-26 MED ORDER — DEXTROSE IN LACTATED RINGERS 5 % IV SOLN: INTRAVENOUS | Status: DC

## 2021-07-26 MED ORDER — CHLORHEXIDINE GLUCONATE CLOTH 2 % EX PADS
6.0000 | MEDICATED_PAD | Freq: Every day | CUTANEOUS | Status: DC
  Administered 2021-07-26 – 2021-07-28 (×4): 6 via TOPICAL

## 2021-07-26 MED ORDER — BRIMONIDINE TARTRATE 0.2 % OP SOLN
1.0000 [drp] | Freq: Three times a day (TID) | OPHTHALMIC | Status: DC
  Administered 2021-07-26 – 2021-07-28 (×7): 1 [drp] via OPHTHALMIC
  Filled 2021-07-26 (×2): qty 5

## 2021-07-26 NOTE — Plan of Care (Signed)
New admission for DKA. ? ?Problem: Education: ?Goal: Ability to describe self-care measures that may prevent or decrease complications (Diabetes Survival Skills Education) will improve ?Outcome: Progressing ?Goal: Individualized Educational Video(s) ?Outcome: Progressing ?  ?Problem: Cardiac: ?Goal: Ability to maintain an adequate cardiac output will improve ?Outcome: Progressing ?  ?Problem: Health Behavior/Discharge Planning: ?Goal: Ability to identify and utilize available resources and services will improve ?Outcome: Progressing ?Goal: Ability to manage health-related needs will improve ?Outcome: Progressing ?  ?Problem: Fluid Volume: ?Goal: Ability to achieve a balanced intake and output will improve ?Outcome: Progressing ?  ?Problem: Metabolic: ?Goal: Ability to maintain appropriate glucose levels will improve ?Outcome: Progressing ?  ?Problem: Nutritional: ?Goal: Maintenance of adequate nutrition will improve ?Outcome: Progressing ?Goal: Maintenance of adequate weight for body size and type will improve ?Outcome: Progressing ?  ?Problem: Respiratory: ?Goal: Will regain and/or maintain adequate ventilation ?Outcome: Progressing ?  ?Problem: Urinary Elimination: ?Goal: Ability to achieve and maintain adequate renal perfusion and functioning will improve ?Outcome: Progressing ?  ?

## 2021-07-26 NOTE — Progress Notes (Signed)
Inpatient Diabetes Program Recommendations ? ?AACE/ADA: New Consensus Statement on Inpatient Glycemic Control (2015) ? ?Target Ranges:  Prepandial:   less than 140 mg/dL ?     Peak postprandial:   less than 180 mg/dL (1-2 hours) ?     Critically ill patients:  140 - 180 mg/dL  ? ?Lab Results  ?Component Value Date  ? GLUCAP 163 (H) 07/26/2021  ? HGBA1C 9.2 (A) 05/03/2021  ? ? ?Review of Glycemic Control ? ?Diabetes history: DM2 ?Outpatient Diabetes medications: Levemir 14 units QD, Novolog 3 units TID with meals ?Current orders for Inpatient glycemic control: Transitioning off IV insulin to Levemir 15 units QD and Novolog 0-15 units TID ? ?HgbA1C - 9.2% (average blood sugar of 217 mg/dL) ? ?Inpatient Diabetes Program Recommendations:   ? ?Agree with orders. ? ?Will likely need Novolog 2 units TID when po intake increases. ? ?Spoke with pt at bedside with interpreter. Explained what her average blood sugar was with HgbA1C of 9.2%. Pt states she occasionally has lows in am and treats with juice, then eats breakfast. Eats 3 meals/day and leaves off juice, soda and sweets. Very limited exercise, uses walker in house. PCP at [facility]. Never misses insulin doses at home. Gives her injections in arms and legs. Said blood sugars usually in high 100s or 200s. Tries to eat healthy. ? ?Will follow glucose trends while inpatient.  ? ?Thank you. ?Lorenda Peck, RD, LDN, CDE ?Inpatient Diabetes Coordinator ?[phone]  ? ? ? ? ? ? ?

## 2021-07-26 NOTE — ED Notes (Signed)
Pt. CBG 454, RN Natosha made aware. ?

## 2021-07-26 NOTE — TOC Initial Note (Addendum)
Transition of Care (TOC) - Initial/Assessment Note  ? ? ?Patient Details  ?Name: Jennifer [name] ?MRN: [id number] ?Date of Birth: Jul 22, 1970 ? ?Transition of Care (TOC) CM/SW Contact:    ?Tawanna Cooler, RN ?Phone Number: ?07/26/2021, 9:45 AM ? ?Clinical Narrative:                 ? ? ?Transition of Care Department (TOC) has reviewed patient and no TOC needs have been identified at this time. We will continue to monitor patient advancement through interdisciplinary progression rounds. If new patient transition needs arise, please place a TOC consult. ? ?Per previous TOC CM notes, patient has a rolling walker, wheelchair, and shower chair at home.  ? ? ?Expected Discharge Plan: Home/Self Care ?Barriers to Discharge: Continued Medical Work up ? ? ?Expected Discharge Plan and Services ?Expected Discharge Plan: Home/Self Care ?  ?   ?Living arrangements for the past 2 months: [place] ?                ?  ?Prior Living Arrangements/Services ?Living arrangements for the past 2 months: [place] ?Lives with:: Spouse, Relatives ?Patient language and need for interpreter reviewed:: Yes ?       ?Need for Family Participation in Patient Care: Yes (Comment) ?Care giver support system in place?: Yes (comment) ?  ?Criminal Activity/Legal Involvement Pertinent to Current Situation/Hospitalization: No - Comment as needed ? ?Activities of Daily Living ?Home Assistive Devices/Equipment: None ?ADL Screening (condition at time of admission) ?Patient's cognitive ability adequate to safely complete daily activities?: Yes ?Is the patient deaf or have difficulty hearing?: No ?Does the patient have difficulty seeing, even when wearing glasses/contacts?: No ?Does the patient have difficulty concentrating, remembering, or making decisions?: No ?Patient able to express need for assistance with ADLs?: Yes ?Does the patient have difficulty dressing or bathing?: No ?Independently performs ADLs?: Yes (appropriate for developmental  age) ?Does the patient have difficulty walking or climbing stairs?: Yes ?Weakness of Legs: Both ?Weakness of Arms/Hands: None ? ?Emotional Assessment ? ?Alcohol / Substance Use: Not Applicable ?Psych Involvement: No (comment) ? ?Admission diagnosis:  DKA (diabetic ketoacidosis) ([place]) [E11.10] ?Atrial fibrillation with RVR ([place]) [I48.91] ?Diabetic ketoacidosis without coma associated with diabetes mellitus due to underlying condition ([place]) [E08.10] ?Patient Active Problem List  ? Diagnosis Date Noted  ? DKA (diabetic ketoacidosis) ([place]) 07/26/2021  ? Elevated troponin   ? History of stroke 05/05/2021  ? Labile blood glucose   ? Uncontrolled type 2 diabetes mellitus with hyperglycemia ([place])   ? Acute blood loss anemia   ? Debility 03/17/2021  ? Glaucoma 03/06/2021  ? At risk for medication noncompliance 03/06/2021  ? Physical deconditioning 01/05/2021  ? 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 Orthostatic hypotension 11/22/2020  ? Lower extremity weakness 11/22/2020  ? CKD (chronic kidney disease) stage 3, GFR 30-59 ml/min (HCC) 11/22/2020  ? Depression 08/22/2020  ? Weight loss, non-intentional 07/04/2020  ? Adult failure to thrive 07/04/2020  ? Hyperglycemia 06/15/2020  ? Early satiety 05/31/2020  ? PAF (paroxysmal atrial fibrillation) ([place]) 09/20/2018  ? Migraine   ? (HFpEF) heart failure with preserved ejection fraction ([place]) 01/01/2018  ? 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  ? Pseudophakia of right eye 09/16/2017  ? 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 Fatigue 04/02/2016  ? 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 Vitamin D deficiency 10/30/2014  ? Diabetic neuropathy ([place]) 05/13/2013  ? Healthcare maintenance 02/26/2011  ? Hyperlipidemia  05/05/2006  ? ?PCP:  Jose Persia, MD ?Pharmacy:   ?[id number] [place], [place] - [address] AT [id number]?Phone: [phone]:  [phone]? ? ?Readmission Risk Interventions ? ?  05/18/2021  ?  3:42 PM 04/12/2020  ?  4:16 PM  ?Readmission Risk Prevention Plan  ?Transportation Screening Complete Complete  ?PCP or Specialist Appt within 3-5 Days  Complete  ?HWallaceor Home Care Consult  Complete  ?Social Work Consult for RCawoodPlanning/Counseling  Complete  ?Palliative Care Screening  Not Applicable  ?Medication Review (Press photographer Complete Complete  ?PCP or Specialist appointment within 3-5 days of discharge Complete   ?HSlay[place]or Home Care Consult Complete   ?SW Recovery Care/Counseling Consult Complete   ?Palliative Care Screening Not Applicable   ?SChicopeePatient Refused   ? ? ? ?

## 2021-07-26 NOTE — Progress Notes (Signed)
?  Carryover admission to the Day Admitter.  I discussed this case with the EDP, Dr. Ralene Bathe.  Per these discussions: ? ? ?This is a 51 year old female with history of diabetes, paroxysmal atrial fibrillation on Eliquis, who is being admitted for DKA complicated by atrial fibrillation with RVR.  Patient reportedly has a history of poor compliance with her outpatient medications, presented this evening with a nausea, vomiting, to be in atrial fibrillation with RVR, with associated initial heart rates in the 150s. BP initially hypotensive with initial systolic blood pressures in the 80s mmHg.  She was started on diltiazem drip, subsequently spontaneously converted to sinus rhythm with heart rates in the 80s, and reportedly has remained in sinus rhythm since that time.  Following spontaneous conversion to sinus rhythm, blood pressure improved, with systolic values now reportedly in the 120s mmHg. ? ?There is also concern for borderline DKA, with initial blood sugars in the high 400s, with presenting CMP also demonstrating low bicarbonate, borderline elevated anion gap, with blood gas reportedly showing acidosis will beta hydroxybutyric acid elevated.  She was started on insulin drip via Endo tool.  ? ?I have placed an order for overnight observation to PCU for further evaluation management of borderline DKA as well as initial atrial fibrillation with RVR. ? ?I have placed some additional preliminary admit orders via the adult multi-morbid admission order set.  ? ?For her borderline DKA, I have continued insulin drip via Endo tool, ordered every hour CBG checks, as well as every 4 hour BMP's through 9 AM.  As the patient's most recent serum potassium level was 4.4, changed IV fluids to ? ?1/2 NS w/ 10 mEq/L Kcl @ 125 cc/hr, with orders to change his IV fluids to d5 1/2 NS w/ 10 mEq/L Kcl @ 125 cc/hr once CBG reflects blood sugar less than 250.  ? ? ? ?Babs Bertin, DO ?Hospitalist ? ?

## 2021-07-26 NOTE — H&P (Signed)
?History and Physical  ? ? ?Patient: Jennifer [name] EUM:[id number] DOB: 03-24-1971 ?DOA: 07/25/2021 ?DOS: the patient was seen and examined on 07/26/2021 ?PCP: Jose Persia, MD  ?Patient coming from: Home ? ?Chief Complaint:  ?Chief Complaint  ?Patient presents with  ? Hyperglycemia  ? Hypotension  ? ?HPI: Jennifer [name] is a 51 y.o. female with medical history significant of lower GI bleed due to acute prostatitis with iron deficiency anemia, hypertension, hyperlipidemia, type 2 diabetes, glaucoma, left eye blindness, depression, COVID-19 virus infection, CAD, non-STEMI who is coming to the emergency department due to developing left lower quadrant pain associated with multiple episodes of nausea, emesis and diarrhea that started suddenly in the afternoon.  Deny melena or hematochezia.  No flank pain, dysuria, frequency or hematuria.  She did not feel well.  EMS was called and her initial vital signs show a blood pressure 70/30 mmHg with a heart rate in the 150s.  She denied fever, chills, sore throat, rhinorrhea, dyspnea, chest pain, but was having lightheadedness and palpitations.  No PND, orthopnea or lower extremity edema.  No polyuria, polydipsia, polyphagia.  The patient stated that she has been taking her regular medications like usual. ? ?ED course: Initial vital signs temperature 97.6 ?F pulse 139, BP 100/74 mmHg, respirations 22 and O2 sat 100% on room air.  The patient received 2000 mL of LR bolus, started on Cardizem and an insulin infusions. ? ?Lab work: Urinalysis showed glucosuria more than 500, ketonuria 20 and proteinuria of 100 mg/dL.  Urine microscopic examination showed more than 50 WBC and rare bacteria.  CBC showed a white count of 5.9, hemoglobin 12.8 g/dL platelets 139.  BHH 3.23 mmol/L.  Troponin was 22 and then 46 ng/L.  Lipase is normal.  CMP showed normal electrolytes except for CO2 of 18 mmol/L.  Glucose 472, BUN 38 and creatinine 1.30 mg/dL.  Albumin was 3.4 g/dL.  The rest of the LFTs  were normal. ? ?Imaging: A 1 view chest radiograph was normal. ?  ?Review of Systems: As mentioned in the history of present illness. All other systems reviewed and are negative. ?Past Medical History:  ?Diagnosis Date  ? Acute lower GI bleeding 03/06/2021  ? Acute proctitis 03/06/2021  ? Anemia, iron deficiency   ? Atrial fibrillation ([place])   ? Blindness of left eye   ? Closed trimalleolar fracture of right ankle 05/02/2020  ? Added automatically from request for surgery [phone]  ? COVID-19 virus infection 06/15/2020  ? Depression   ? 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 Annotation: secondary to uveitis of unknown etiology Qualifier: Diagnosis of  By: Lazarus Salines, Beth    ? Hair loss   ? History of fracture of clavicle 05/18/2015  ? Hyperlipidemia   ? Hypertension   ? Hypertension associated with diabetes ([place]) 07/23/2016  ? Iron deficiency anemia 05/13/2013  ? Left anterior shoulder pain 07/06/2017  ? NSTEMI (non-ST elevated myocardial infarction) ([place]) 02/04/2021  ? Pap smear abnormality of cervix with LGSIL   ? Routine/ritual circumcision   ? Type II diabetes mellitus ([place])   ? Uveitis   ? ?Past Surgical History:  ?Procedure Laterality Date  ? CESAREAN SECTION    ? x 1  ? EYE SURGERY Bilateral   ? Lasik  ? LEFT HEART CATH AND CORONARY ANGIOGRAPHY N/A 02/05/2021  ? Procedure: LEFT HEART CATH AND CORONARY ANGIOGRAPHY;  Surgeon: Dixie Dials, MD;  Location: [facility];  Service: Cardiovascular;  Laterality: N/A;  ? MULTIPLE  TOOTH EXTRACTIONS    ? bottom denture  ? ORIF ANKLE FRACTURE Right 05/05/2020  ? Procedure: OPEN REDUCTION INTERNAL FIXATION (ORIF) ANKLE FRACTURE;  Surgeon: Shona Needles, MD;  Location: [facility];  Service: Orthopedics;  Laterality: Right;  ? ?Social History:  reports that she has never smoked. She has never used smokeless tobacco. She reports that she does not drink alcohol and does not use drugs. ? ?No Known Allergies ? ?Family History  ?Problem Relation  Age of Onset  ? Diabetes Father   ? Hypertension Other   ? ? ?Prior to Admission medications   ?Medication Sig Start Date End Date Taking? Authorizing Provider  ?acetaminophen (TYLENOL) 325 MG tablet Take 2 tablets (650 mg total) by mouth every 6 (six) hours as needed for mild pain (or Fever >/= 101). 04/03/21   Angiulli, Lavon Paganini, PA-C  ?apixaban (ELIQUIS) 5 MG TABS tablet Take 1 tablet (5 mg total) by mouth 2 (two) times daily. 04/03/21   Angiulli, Lavon Paganini, PA-C  ?brimonidine (ALPHAGAN) 0.2 % ophthalmic solution Place 1 drop into the left eye 3 (three) times daily. ?Patient taking differently: Place 1 drop into both eyes 3 (three) times daily. 04/03/21   Angiulli, Lavon Paganini, PA-C  ?calcium citrate-vitamin D (CITRACAL+D) 315-200 MG-UNIT tablet Take 2 tablets by mouth 2 (two) times daily. 04/03/21 04/03/22  Angiulli, Lavon Paganini, PA-C  ?dorzolamide-timolol (COSOPT) 22.3-6.8 MG/ML ophthalmic solution Place 1 drop into the left eye 2 (two) times daily. 04/03/21   Angiulli, Lavon Paganini, PA-C  ?gabapentin (NEURONTIN) 300 MG capsule Take 1 capsule (300 mg total) by mouth at bedtime. ?Patient taking differently: Take 300 mg by mouth 3 (three) times daily. 04/03/21 04/03/22  Angiulli, Lavon Paganini, PA-C  ?insulin aspart (NOVOLOG FLEXPEN) 100 UNIT/ML FlexPen Inject 3 Units into the skin 3 (three) times daily with meals. 04/14/21   Meredith Staggers, MD  ?insulin detemir (LEVEMIR) 100 UNIT/ML FlexPen Inject 15 Units into the skin at bedtime. 05/29/21   Raulkar, Clide Deutscher, MD  ?Insulin Pen Needle (BD PEN NEEDLE NANO 2ND GEN) 32G X 4 MM MISC 1 each by Does not apply route as needed. 06/29/21   Raulkar, Clide Deutscher, MD  ?Insulin Pen Needle (PEN NEEDLES) 32G X 4 MM MISC Use 4 times daily to inject insulin. 06/27/21   Jose Persia, MD  ?linagliptin (TRADJENTA) 5 MG TABS tablet Take 1 tablet (5 mg total) by mouth daily. 04/03/21 05/16/21  Angiulli, Lavon Paganini, PA-C  ?lisinopril (ZESTRIL) 5 MG tablet Take 1 tablet (5 mg total) by mouth  daily. ?Patient not taking: Reported on 07/16/2021 05/19/21   Madalyn Rob, MD  ?magnesium gluconate (MAGONATE) 500 MG tablet Take 0.5 tablets (250 mg total) by mouth at bedtime. 04/03/21   Angiulli, Lavon Paganini, PA-C  ?metoprolol succinate (TOPROL-XL) 50 MG 24 hr tablet Take 1 tablet (50 mg total) by mouth daily. Take with or immediately following a meal. ?Patient not taking: Reported on 07/16/2021 05/19/21   Madalyn Rob, MD  ?Multiple Vitamin (MULTIVITAMIN WITH MINERALS) TABS tablet Take 1 tablet by mouth daily. 03/18/21   Debbe Odea, MD  ?rosuvastatin (CRESTOR) 20 MG tablet Take 1 tablet (20 mg total) by mouth daily. ?Patient not taking: Reported on 07/16/2021 05/05/21 05/05/22  Mitzi Hansen, MD  ?Continuous Blood Gluc Sensor (DEXCOM G6 SENSOR) MISC Use to check blood sugar at least 6 times a day ?Patient not taking: Reported on 08/29/2020 08/09/20 09/27/20  Harvie Heck, MD  ?Continuous Blood Gluc Transmit (DEXCOM G6 TRANSMITTER) MISC Use  to check blood sugar at least 6 times a day ?Patient not taking: Reported on 08/29/2020 08/09/20 09/27/20  Harvie Heck, MD  ? ? ?Physical Exam: ?Vitals:  ? 07/26/21 0500 07/26/21 0507 07/26/21 0600 07/26/21 0700  ?BP:  (!) 119/53 (!) 107/56 (!) 114/54  ?Pulse:  84 79 80  ?Resp:  '14 13 15  '$ ?Temp:      ?TempSrc:      ?SpO2:  100% 100% 100%  ?Weight: 64 kg     ?Height: '5\' 6"'$  (1.676 m)     ? ?Physical Exam ?Vitals and nursing note reviewed.  ?Constitutional:   ?   Appearance: Normal appearance. She is normal weight.  ?HENT:  ?   Mouth/Throat:  ?   Mouth: Mucous membranes are moist.  ?Eyes:  ?   Pupils: Pupils are equal, round, and reactive to light.  ?Neck:  ?   Vascular: No JVD.  ?Cardiovascular:  ?   Rate and Rhythm: Normal rate and regular rhythm.  ?   Heart sounds: S1 normal and S2 normal.  ?Pulmonary:  ?   Effort: Pulmonary effort is normal.  ?   Breath sounds: Normal breath sounds.  ?Abdominal:  ?   General: Bowel sounds are normal. There is no distension.  ?   Palpations: Abdomen is  soft.  ?   Tenderness: There is abdominal tenderness in the left lower quadrant. There is no right CVA tenderness, left CVA tenderness, guarding or rebound.  ?Musculoskeletal:  ?   Cervical back: Neck supple.  ?   R

## 2021-07-26 NOTE — ED Notes (Signed)
Cardizem stopped per Dr Ayesha Rumpf.  ?

## 2021-07-27 ENCOUNTER — Encounter (HOSPITAL_COMMUNITY): Payer: Self-pay | Admitting: Internal Medicine

## 2021-07-27 DIAGNOSIS — E1110 Type 2 diabetes mellitus with ketoacidosis without coma: Secondary | ICD-10-CM | POA: Diagnosis not present

## 2021-07-27 DIAGNOSIS — N1831 Chronic kidney disease, stage 3a: Secondary | ICD-10-CM | POA: Diagnosis not present

## 2021-07-27 DIAGNOSIS — E0810 Diabetes mellitus due to underlying condition with ketoacidosis without coma: Secondary | ICD-10-CM | POA: Diagnosis not present

## 2021-07-27 DIAGNOSIS — I480 Paroxysmal atrial fibrillation: Secondary | ICD-10-CM | POA: Diagnosis not present

## 2021-07-27 LAB — GLUCOSE, CAPILLARY
Glucose-Capillary: 141 mg/dL — ABNORMAL HIGH (ref 70–99)
Glucose-Capillary: 187 mg/dL — ABNORMAL HIGH (ref 70–99)
Glucose-Capillary: 148 mg/dL — ABNORMAL HIGH (ref 70–99)
Glucose-Capillary: 67 mg/dL — ABNORMAL LOW (ref 70–99)
Glucose-Capillary: 93 mg/dL (ref 70–99)

## 2021-07-27 LAB — HEMOGLOBIN A1C
Hgb A1c MFr Bld: >15.5 % — ABNORMAL HIGH (ref 4.8–5.6)
Mean Plasma Glucose: >398 mg/dL

## 2021-07-27 NOTE — Progress Notes (Signed)
Pt received as transfer from 1241. VS stable, no c/o pain, tele showing NSR. Assisted to order clear liquid diet tray. I agree with all areas of charted assessment from this shift. Continue to monitor. Hortencia Conradi RN  ?

## 2021-07-27 NOTE — Plan of Care (Signed)
?  Problem: Education: ?Goal: Ability to describe self-care measures that may prevent or decrease complications (Diabetes Survival Skills Education) will improve ?Outcome: Progressing ?  ?Problem: Cardiac: ?Goal: Ability to maintain an adequate cardiac output will improve ?Outcome: Progressing ?  ?Problem: Health Behavior/Discharge Planning: ?Goal: Ability to manage health-related needs will improve ?Outcome: Progressing ?  ?Problem: Metabolic: ?Goal: Ability to maintain appropriate glucose levels will improve ?Outcome: Progressing ?  ?Problem: Respiratory: ?Goal: Will regain and/or maintain adequate ventilation ?Outcome: Progressing ?  ?

## 2021-07-27 NOTE — Progress Notes (Signed)
[facility] ? ?PROGRESS NOTE ? ?[name] WLS:[id number] DOB: [birth year] DOA: 07/25/2021 ?PCP: Jose Persia, MD ? ? ?Brief HPI:   ?51 year old female with history of lower GI bleed due to acute proctitis, iron-deficiency anemia, hypertension, diabetes mellitus type 2, left eye blindness, glaucoma, depression, COVID-19 infection, CAD, NSTEMI came to ED with developing left lower quadrant pain with multiple episodes of nausea, emesis and diarrhea.  No history of melena or hematochezia.  Patient was found to be in DKA and started on IV insulin per DKA protocol.  She was also found to be in paroxysmal atrial fibrillation with RVR and started on Cardizem gtt. ? ? ? ?Subjective  ? ?This morning patient is off Cardizem gtt. and IV insulin has been transitioned to subcu Levemir along with sliding scale insulin with NovoLog.  She has mild left lower quadrant abdominal pain.  Denies nausea vomiting or diarrhea. ? ? Assessment/Plan:  ? ? ? ?Diabetic ketoacidosis ?-Resolved ?-Patient has been transitioned from IV insulin to to sliding scale insulin along with Levemir ?-Anion gap is normal ? ?Diabetes mellitus type 2 ?-Continue Levemir 15 units subcu daily ?-Continue sliding scale insulin with NovoLog ?-Tolerating carb modified diet ? ?Left lower quadrant pain ?-Patient has history of acute proctitis and thickening of sigmoid colon ?-This was confirmed on CT scan abdomen/pelvis with contrast in November 2022 ?-At the time she was seen by [facility], plan was to follow-up as outpatient for colonoscopy ?-Patient incidentally was seen by Dr. Stacie Glaze GI on 07/10/2021 for rectal bleeding and left lower quadrant pain ?-As patient had recent CVA, colonoscopy was postponed ?-I have called and discussed with Dr. Michail Sermon who is on-call for [facility], he reviewed the records from his clinic and recommends to follow-up with Dr. Paulita Fujita as outpatient ?-In the hospital patient does not have any worsening of symptoms no rigidity  or guarding, no diarrhea, no hematochezia to warrant any further investigation ?-We will continue to monitor ? ?Paroxysmal atrial fibrillation with RVR ?-Patient was started on diltiazem gtt. which has been tapered off\ ?-Currently patient is on metoprolol ER 50 mg p.o. daily ?-Continue anticoagulation with apixaban 5 mg p.o. twice daily ?-Follow-up cardiology as outpatient ? ?Hyperlipidemia ?-Continue rosuvastatin 20 mg p.o. daily ? ?Diabetic neuropathy ?-Continue gabapentin 300 mg p.o. daily ? ?CKD stage IIIa ?-Creatinine improved with IV hydration ?-Today creatinine is 0.98 ? ?Grade 1 diastolic dysfunction ?-Euvolemic ?-Continue metoprolol 50 mg p.o. daily ? ?Elevated troponin ?-Mild elevation of troponin ?-No chest pain ?-Troponin: 22, 46 ?-Likely from demand ischemia ? ?Glaucoma ?-Continue Alphagan, Cosopt drops ?-Follow-up ophthalmology as outpatient ? ?Thrombocytopenia ?-Mild ?-We will monitor ? ? ?Medications ? ?  ? apixaban  5 mg Oral BID  ? brimonidine  1 drop Both Eyes TID  ? calcium-vitamin D  2 tablet Oral BID  ? Chlorhexidine Gluconate Cloth  6 each Topical Daily  ? dorzolamide-timolol  1 drop Left Eye BID  ? gabapentin  300 mg Oral QHS  ? insulin aspart  0-15 Units Subcutaneous TID WC  ? insulin detemir  15 Units Subcutaneous Daily  ? magnesium gluconate  250 mg Oral Daily  ? metoprolol succinate  50 mg Oral Daily  ? ? ? Data Reviewed:  ? ?CBG: ? ?Recent Labs  ?Lab 07/26/21 ?1223 07/26/21 ?1713 07/26/21 ?1739 07/26/21 ?2157 07/27/21 ?8115  ?GLUCAP 163* 91 88 91 141*  ? ? ?SpO2: 100 %  ? ? ?Vitals:  ? 07/27/21 0544 07/27/21 0600 07/27/21 0821 07/27/21 1034  ?BP:    Marland Kitchen)  151/70  ?Pulse:  72  71  ?Resp:  20    ?Temp:   (P) 98.5 ?F (36.9 ?C)   ?TempSrc:   (P) Oral   ?SpO2:  100%    ?Weight: 65.4 kg     ?Height:      ? ? ? ? ?Data Reviewed: ? ?Basic Metabolic Panel: ?Recent Labs  ?Lab 07/25/21 ?2330 07/26/21 ?0414 07/26/21 ?0850  ?NA 136 138 136  ?K 4.4 3.9 4.6  ?CL 104 105 106  ?CO2 18* 25 22  ?GLUCOSE  472* 364* 166*  ?BUN 38* 36* 35*  ?CREATININE 1.30* 1.16* 0.98  ?CALCIUM 9.3 9.1 9.0  ?MG 2.2 1.9  --   ?PHOS  --  3.6  --   ? ? ?CBC: ?Recent Labs  ?Lab 07/25/21 ?2330 07/26/21 ?0414  ?WBC 5.9 4.7  ?NEUTROABS 4.7 4.1  ?HGB 12.8 11.7*  ?HCT 41.1 36.1  ?MCV 84.9 83.2  ?PLT 139* 132*  ? ? ?LFT ?Recent Labs  ?Lab 07/25/21 ?2330 07/26/21 ?0414  ?AST 19 21  ?ALT 13 14  ?ALKPHOS 80 66  ?BILITOT 0.5 <0.1*  ?PROT 7.0 5.7*  ?ALBUMIN 3.4* 2.9*  ? ?  ?Antibiotics: ?Anti-infectives (From admission, onward)  ? ? None  ? ?  ? ? ? ?DVT prophylaxis: Apixaban ? ?Code Status: Full code ? ?Family Communication: No family at bedside ? ? ?CONSULTS  ? ? ?Objective  ? ? ?Physical Examination: ? ? ?General-appears in no acute distress ?Heart-S1-S2, regular, no murmur auscultated ?Lungs-clear to auscultation bilaterally, no wheezing or crackles auscultated ?Abdomen-soft, mild tenderness in left lower quadrant, no rigidity or guarding elicited , no organomegaly ?Extremities-no edema in the lower extremities ?Neuro-alert, oriented x3, no focal deficit noted ? ? ?Status is: Inpatient: Diabetic ketoacidosis ? ? ? ?  ? ? ?Oswald Hillock ?  ?[facility] ?If 7PM-7AM, please contact night-coverage at [email or web address], ?Office  [phone] ? ? ?07/27/2021, 10:44 AM  LOS: 0 days  ? ? ? ? ? ? ? ? ? ? ?  ?

## 2021-07-27 NOTE — Progress Notes (Signed)
Hypoglycemic Event ? ?CBG: 67   ? ?Treatment: 4 oz juice/soda ? ?Symptoms: None ? ?Follow-up CBG: Time:2201 CBG Result:93  ? ?Possible Reasons for Event: Inadequate meal intake ? ?Comments/MD notified:Order noted to advance diet to full diabetic diet as tolerated.  Pt still on clear liquids.  Will advance diet to carb modified diet. ? ?Ayesha Mohair BSN RN CMSRN ?07/27/2021, 10:10 PM ? ? ? ?

## 2021-07-28 DIAGNOSIS — H409 Unspecified glaucoma: Secondary | ICD-10-CM

## 2021-07-28 DIAGNOSIS — E0810 Diabetes mellitus due to underlying condition with ketoacidosis without coma: Secondary | ICD-10-CM

## 2021-07-28 DIAGNOSIS — N1831 Chronic kidney disease, stage 3a: Secondary | ICD-10-CM | POA: Diagnosis not present

## 2021-07-28 DIAGNOSIS — I5189 Other ill-defined heart diseases: Secondary | ICD-10-CM

## 2021-07-28 DIAGNOSIS — D696 Thrombocytopenia, unspecified: Secondary | ICD-10-CM

## 2021-07-28 DIAGNOSIS — E1110 Type 2 diabetes mellitus with ketoacidosis without coma: Secondary | ICD-10-CM | POA: Diagnosis not present

## 2021-07-28 DIAGNOSIS — I480 Paroxysmal atrial fibrillation: Secondary | ICD-10-CM | POA: Diagnosis not present

## 2021-07-28 DIAGNOSIS — E1142 Type 2 diabetes mellitus with diabetic polyneuropathy: Secondary | ICD-10-CM

## 2021-07-28 DIAGNOSIS — I4891 Unspecified atrial fibrillation: Secondary | ICD-10-CM | POA: Diagnosis not present

## 2021-07-28 DIAGNOSIS — E782 Mixed hyperlipidemia: Secondary | ICD-10-CM

## 2021-07-28 LAB — GLUCOSE, CAPILLARY
Glucose-Capillary: 126 mg/dL — ABNORMAL HIGH (ref 70–99)
Glucose-Capillary: 154 mg/dL — ABNORMAL HIGH (ref 70–99)
Glucose-Capillary: 212 mg/dL — ABNORMAL HIGH (ref 70–99)
Glucose-Capillary: 81 mg/dL (ref 70–99)

## 2021-07-28 NOTE — Progress Notes (Signed)
Patient verbalized she understood instructions, some language barrier. Will go over with son when he comes to pick her up and ensure he understands also.  ?

## 2021-07-28 NOTE — Discharge Instructions (Signed)

## 2021-07-28 NOTE — Plan of Care (Signed)
?  Problem: Education: ?Goal: Ability to describe self-care measures that may prevent or decrease complications (Diabetes Survival Skills Education) will improve ?Outcome: Progressing ?  ?Problem: Nutritional: ?Goal: Maintenance of adequate nutrition will improve ?Outcome: Progressing ?  ?Problem: Health Behavior/Discharge Planning: ?Goal: Ability to identify and utilize available resources and services will improve ?Outcome: Progressing ?Goal: Ability to manage health-related needs will improve ?Outcome: Progressing ?  ?

## 2021-07-28 NOTE — Progress Notes (Signed)
Patient discharge complete. Waiting for ride. Son is to come pick her up. Because language barrier, will go over instructions with son also when he arrives.  ? ?

## 2021-07-29 DIAGNOSIS — I4891 Unspecified atrial fibrillation: Secondary | ICD-10-CM

## 2021-07-29 NOTE — Discharge Summary (Signed)
Physician Discharge Summary  ?Jennifer [name] AST:[id number] DOB: [birth year] DOA: 07/25/2021 ? ?PCP: Jose Persia, MD ? ?Admit date: 07/25/2021 ?Discharge date: 07/28/2021 ? ?Admitted From: home ?Disposition:home  ? ?Recommendations for Outpatient Follow-up:  ?Follow up with PCP in 1-2 weeks ?Please obtain labs/tests: BMP in 1-2 weeks ?Please follow up on the following pending results: none ? ?Home Health:none  ?Equipment/Devices:none ? ?Discharge Condition:good  ?CODE STATUS:FULL  ?Diet recommendation: Heart Healthy / Carb Modified ? ?Brief/Interim Summary: ?Patient presented to the emergency department 07/25/2021 with chief complaint of elevated blood sugar, abrupt onset vomiting and diarrhea and associated abdominal discomfort, EMS noted initial blood pressure 70/30 with heart rate in the 150s.  Patient received 2 L LR bolus, started on Cardizem and insulin.  Admitted 07/26/2021 for DKA, atrial fibrillation with RVR.  Over the next few days patient was able to come off of insulin drip, diltiazem infusion was tapered and patient was transitioned to metoprolol ER 50 mg daily, continued on apixaban.  Remained stable and was medically appropriate for discharge 07/28/2021 ? ?Admission diagnoses: DKA, A-fib RVR  ? ?discharge Diagnoses:  ?Principal Problem: ?  DKA (diabetic ketoacidosis) ([place]) ?Active Problems: ?  Hyperlipidemia ?  Diabetic neuropathy ([place]) ?  CKD (chronic kidney disease) stage 3, GFR 30-59 ml/min (H[facility]) ?  Glaucoma ?  Elevated troponin ?  Grade I diastolic dysfunction ?  Paroxysmal atrial fibrillation with RVR ([place]) ?  Thrombocytopenia ([place]) ? ? ? ? ?Discharge Instructions ? ?Discharge Instructions   ? ? Call MD for:  persistant nausea and vomiting   Complete by: As directed ?  ? Call MD for:  redness, tenderness, or signs of infection (pain, swelling, redness, odor or green/yellow discharge around incision site)   Complete by: As directed ?  ? Call MD for:  severe uncontrolled pain   Complete by: As directed ?  ?  Diet - low sodium heart healthy   Complete by: As directed ?  ? Diet Carb Modified   Complete by: As directed ?  ? Discharge instructions   Complete by: As directed ?  ? PLEASE CALL YOUR PRIMARY DOCTOR ON Monday TO MAKE AN APPOINTMENT. THEY NEED TO MAKE SURE YOUR SUGARS ARE OK.  ? Increase activity slowly   Complete by: As directed ?  ? ?  ? ?Allergies as of 07/28/2021   ?No Known Allergies ?  ? ?  ?Medication List  ?  ? ?STOP taking these medications   ? ?lisinopril 5 MG tablet ?Commonly known as: ZESTRIL ?  ?magnesium gluconate 500 MG tablet ?Commonly known as: MAGONATE ?  ? ?  ? ?TAKE these medications   ? ?acetaminophen 325 MG tablet ?Commonly known as: TYLENOL ?Take 2 tablets (650 mg total) by mouth every 6 (six) hours as needed for mild pain (or Fever >/= 101). ?  ?apixaban 5 MG Tabs tablet ?Commonly known as: ELIQUIS ?Take 1 tablet (5 mg total) by mouth 2 (two) times daily. ?  ?brimonidine 0.2 % ophthalmic solution ?Commonly known as: ALPHAGAN ?Place 1 drop into the left eye 3 (three) times daily. ?  ?calcium citrate-vitamin D 315-200 MG-UNIT tablet ?Commonly known as: CITRACAL+D ?Take 2 tablets by mouth 2 (two) times daily. ?  ?dorzolamide-timolol 22.3-6.8 MG/ML ophthalmic solution ?Commonly known as: COSOPT ?Place 1 drop into the left eye 2 (two) times daily. ?  ?gabapentin 300 MG capsule ?Commonly known as: Neurontin ?Take 1 capsule (300 mg total) by mouth at bedtime. ?What changed: when to take this ?  ?insulin detemir  100 UNIT/ML FlexPen ?Commonly known as: LEVEMIR ?Inject 15 Units into the skin at bedtime. ?  ?metoprolol succinate 50 MG 24 hr tablet ?Commonly known as: TOPROL-XL ?Take 1 tablet (50 mg total) by mouth daily. Take with or immediately following a meal. ?  ?multivitamin with minerals Tabs tablet ?Take 1 tablet by mouth daily. ?  ?NovoLOG FlexPen 100 UNIT/ML FlexPen ?Generic drug: insulin aspart ?Inject 3 Units into the skin 3 (three) times daily with meals. ?  ?Pen Needles 32G X 4 MM Misc ?Use  4 times daily to inject insulin. ?  ?BD Pen Needle Nano 2nd Gen 32G X 4 MM Misc ?Generic drug: Insulin Pen Needle ?1 each by Does not apply route as needed. ?  ?rosuvastatin 20 MG tablet ?Commonly known as: Crestor ?Take 1 tablet (20 mg total) by mouth daily. ?  ? ?  ? ? ? ?No Known Allergies ? ?Consultations: ?none ? ? ?Procedures/Studies: ?DG Chest Port 1 View ? ?Result Date: 07/25/2021 ?CLINICAL DATA:  Vomiting and hypotension. EXAM: PORTABLE CHEST 1 VIEW COMPARISON:  Portable chest 06/04/2021 FINDINGS: The heart size and mediastinal contours are within normal limits. Both lungs are clear with mildly elevated right hemidiaphragm. The visualized skeletal structures are unremarkable. IMPRESSION: No evidence of acute chest disease or interval changes. Electronically Signed   By: Telford Nab M.D.   On: 07/25/2021 23:56   ? ? ? ? ?Subjective: Patient seen and examined at bedside morning of 07/28/2021, interpreter service used to assist with obtaining history.  Patient stated that she was feeling well, no chest pain/shortness of breath, no abdominal pain, no dizziness.  She was feeling well to go home. ? ? ?Discharge Exam: ?Vitals:  ? 07/28/21 0011 07/28/21 0503  ?BP: 115/65 (!) 118/58  ?Pulse: 61 71  ?Resp: 18 18  ?Temp: 97.9 ?F (36.6 ?C) 98.3 ?F (36.8 ?C)  ?SpO2: 100% 100%  ? ?Vitals:  ? 07/27/21 1635 07/27/21 2103 07/28/21 0011 07/28/21 0503  ?BP: 113/60 106/63 115/65 (!) 118/58  ?Pulse: 64 62 61 71  ?Resp: '14 18 18 18  '$ ?Temp: 98 ?F (36.7 ?C) 98.2 ?F (36.8 ?C) 97.9 ?F (36.6 ?C) 98.3 ?F (36.8 ?C)  ?TempSrc: Oral Oral Oral Oral  ?SpO2: 99% 100% 100% 100%  ?Weight:    67.1 kg  ?Height:      ? ? ?General: Pt is alert, awake, not in acute distress ?Cardiovascular: RRR, S1/S2 +, no rubs, no gallops ?Respiratory: CTA bilaterally, no wheezing, no rhonchi ?Abdominal: Soft, NT, ND, bowel sounds + ?Extremities: no edema, no cyanosis ? ? ? ? ?The results of significant diagnostics from this hospitalization (including imaging,  microbiology, ancillary and laboratory) are listed below for reference.   ? ? ?Microbiology: ?Recent Results (from the past 240 hour(s))  ?Resp Panel by RT-PCR (Flu A&B, Covid) Nasopharyngeal Swab     Status: None  ? Collection Time: 07/25/21 10:55 PM  ? Specimen: Nasopharyngeal Swab; Nasopharyngeal(NP) swabs in vial transport medium  ?Result Value Ref Range Status  ? SARS Coronavirus 2 by RT PCR NEGATIVE NEGATIVE Final  ?  Comment: (NOTE) ?SARS-CoV-2 target nucleic acids are NOT DETECTED. ? ?The SARS-CoV-2 RNA is generally detectable in upper respiratory ?specimens during the acute phase of infection. The lowest ?concentration of SARS-CoV-2 viral copies this assay can detect is ?138 copies/mL. A negative result does not preclude SARS-Cov-2 ?infection and should not be used as the sole basis for treatment or ?other patient management decisions. A negative result may occur with  ?improper specimen  collection/handling, submission of specimen other ?than nasopharyngeal swab, presence of viral mutation(s) within the ?areas targeted by this assay, and inadequate number of viral ?copies(<138 copies/mL). A negative result must be combined with ?clinical observations, patient history, and epidemiological ?information. The expected result is Negative. ? ?Fact Sheet for Patients:  ?EntrepreneurPulse.com.au ? ?Fact Sheet for Healthcare Providers:  ?IncredibleEmployment.be ? ?This test is no t yet approved or cleared by the [place] FDA and  ?has been authorized for detection and/or diagnosis of SARS-CoV-2 by ?FDA under an Emergency Use Authorization (EUA). This EUA will remain  ?in effect (meaning this test can be used) for the duration of the ?COVID-19 declaration under Section 564(b)(1) of the Act, 21 ?U.S.C.section 360bbb-3(b)(1), unless the authorization is terminated  ?or revoked sooner.  ? ? ?  ? Influenza A by PCR NEGATIVE NEGATIVE Final  ? Influenza B by PCR NEGATIVE NEGATIVE  Final  ?  Comment: (NOTE) ?The Xpert Xpress SARS-CoV-2/FLU/RSV plus assay is intended as an aid ?in the diagnosis of influenza from Nasopharyngeal swab specimens and ?should not be used as a sole basis f

## 2021-08-03 ENCOUNTER — Ambulatory Visit: Payer: Medicaid Other | Admitting: Occupational Therapy

## 2021-08-03 ENCOUNTER — Ambulatory Visit: Payer: Medicaid Other | Admitting: Physical Therapy

## 2021-08-04 ENCOUNTER — Other Ambulatory Visit: Payer: Self-pay | Admitting: Internal Medicine

## 2021-08-06 MED ORDER — ROSUVASTATIN CALCIUM 20 MG PO TABS: 20.0000 mg | ORAL_TABLET | Freq: Every day | ORAL | 0 refills | Status: DC

## 2021-08-08 ENCOUNTER — Ambulatory Visit: Payer: Medicaid Other | Admitting: Occupational Therapy

## 2021-08-08 ENCOUNTER — Other Ambulatory Visit: Payer: Self-pay | Admitting: Obstetrics and Gynecology

## 2021-08-08 ENCOUNTER — Ambulatory Visit: Payer: Medicaid Other | Admitting: Physical Therapy

## 2021-08-08 NOTE — Patient Instructions (Signed)
Visit Information ? ?[name]  - as a part of your Medicaid benefit, you are eligible for care management and care coordination services at no cost or copay. I was unable to reach you by phone today but would be happy to help you with your health related needs. Please feel free to call me at [phone] ? ?A member of the Managed Medicaid care management team will reach out to you again over the next 30 business days.  ? ?[name] RN, BSN ?[facility] ?Care Management Coordinator - Managed Medicaid High Risk ?[phone] ?  ?

## 2021-08-08 NOTE — Patient Outreach (Signed)
Care Coordination ? ?08/08/2021 ? ?[name] ?1971/01/28 ?[id number] ? ? ?Medicaid Managed Care  ? ?Unsuccessful Outreach Note ? ?08/08/2021 ?Name: Jennifer [name] MRN: [id number] DOB: 1970/04/24 ? ?Referred by: Jose Persia, MD ?Reason for referral : High Risk Managed Medicaid (Unsuccessful telephone outreach) ? ? ?An unsuccessful telephone outreach was attempted today. The patient was referred to the case management team for assistance with care management and care coordination.  ? ?Follow Up Plan: The care management team will reach out to the patient again over the next 30 business  days.  ? ?[name] RN, BSN ?[facility] ?Care Management Coordinator - Managed Medicaid High Risk ?[phone] ?  ? ? ?

## 2021-08-15 ENCOUNTER — Ambulatory Visit: Payer: Medicaid Other | Admitting: Physical Therapy

## 2021-08-15 ENCOUNTER — Ambulatory Visit: Payer: Medicaid Other | Admitting: Occupational Therapy

## 2021-08-15 ENCOUNTER — Ambulatory Visit (INDEPENDENT_AMBULATORY_CARE_PROVIDER_SITE_OTHER): Payer: Medicaid Other | Admitting: Internal Medicine

## 2021-08-15 VITALS — BP 115/60 | HR 80 | Temp 98.0°F | Wt 143.6 lb

## 2021-08-15 DIAGNOSIS — E11319 Type 2 diabetes mellitus with unspecified diabetic retinopathy without macular edema: Secondary | ICD-10-CM

## 2021-08-15 DIAGNOSIS — F32A Depression, unspecified: Secondary | ICD-10-CM

## 2021-08-15 DIAGNOSIS — I480 Paroxysmal atrial fibrillation: Secondary | ICD-10-CM

## 2021-08-15 DIAGNOSIS — Z59819 Housing instability, housed unspecified: Secondary | ICD-10-CM

## 2021-08-15 DIAGNOSIS — E782 Mixed hyperlipidemia: Secondary | ICD-10-CM

## 2021-08-15 DIAGNOSIS — Z8673 Personal history of transient ischemic attack (TIA), and cerebral infarction without residual deficits: Secondary | ICD-10-CM

## 2021-08-15 DIAGNOSIS — Z794 Long term (current) use of insulin: Secondary | ICD-10-CM | POA: Diagnosis not present

## 2021-08-15 DIAGNOSIS — E1165 Type 2 diabetes mellitus with hyperglycemia: Secondary | ICD-10-CM

## 2021-08-15 LAB — BASIC METABOLIC PANEL
Anion gap: 9 (ref 5–15)
BUN: 36 mg/dL — ABNORMAL HIGH (ref 6–20)
CO2: 25 mmol/L (ref 22–32)
Calcium: 9.7 mg/dL (ref 8.9–10.3)
Chloride: 97 mmol/L — ABNORMAL LOW (ref 98–111)
Creatinine, Ser: 1.05 mg/dL — ABNORMAL HIGH (ref 0.44–1.00)
GFR, Estimated: >60 mL/min (ref >60)
Glucose, Bld: 566 mg/dL (ref 70–99)
Potassium: 4.7 mmol/L (ref 3.5–5.1)
Sodium: 131 mmol/L — ABNORMAL LOW (ref 135–145)

## 2021-08-15 LAB — GLUCOSE, CAPILLARY: Glucose-Capillary: 552 mg/dL (ref 70–99)

## 2021-08-15 MED ORDER — ROSUVASTATIN CALCIUM 20 MG PO TABS: 20.0000 mg | ORAL_TABLET | Freq: Every day | ORAL | 0 refills | Status: AC

## 2021-08-15 MED ORDER — INSULIN ASPART 100 UNIT/ML IJ SOLN
3.0000 [IU] | Freq: Once | INTRAMUSCULAR | Status: AC
  Administered 2021-08-15: 3 [IU] via SUBCUTANEOUS

## 2021-08-15 MED ORDER — METOPROLOL SUCCINATE ER 50 MG PO TB24: 50.0000 mg | ORAL_TABLET | Freq: Every day | ORAL | 1 refills | Status: DC

## 2021-08-15 MED ORDER — CITALOPRAM HYDROBROMIDE 10 MG PO TABS: 10.0000 mg | ORAL_TABLET | Freq: Every day | ORAL | 2 refills | Status: DC

## 2021-08-15 NOTE — Assessment & Plan Note (Signed)
Patient on rosuvastatin 20 mg daily, apparently she has a history of being on atorvastatin 40 mg daily but given her noncompliance is fallen off a list.  Given the majority of our discussion today was focused on her uncontrolled diabetes, was unable to further assess cholesterol level. ?- Reordered rosuvastatin 20 mg daily ?- Consider lipid panel at next visit ?

## 2021-08-15 NOTE — Progress Notes (Signed)
? ?  CC: HFU ? ?HPI: ? ?[name] is a 51 y.o. person, with a PMH noted below, who presents to the clinic For HFU. To see the management of their acute and chronic conditions, please see the A&P note under the Encounters tab.  ? ?Past Medical History:  ?Diagnosis Date  ? Acute lower GI bleeding 03/06/2021  ? Acute proctitis 03/06/2021  ? Anemia, iron deficiency   ? Atrial fibrillation ([place])   ? Blindness of left eye   ? Closed trimalleolar fracture of right ankle 05/02/2020  ? Added automatically from request for surgery [id number]  ? COVID-19 virus infection 06/15/2020  ? Depression   ? 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 Annotation: secondary to uveitis of unknown etiology Qualifier: Diagnosis of  By: Lazarus Salines, Beth    ? Hair loss   ? History of fracture of clavicle 05/18/2015  ? Hyperlipidemia   ? Hypertension   ? Hypertension associated with diabetes ([place]) 07/23/2016  ? Iron deficiency anemia 05/13/2013  ? Left anterior shoulder pain 07/06/2017  ? NSTEMI (non-ST elevated myocardial infarction) ([place]) 02/04/2021  ? Pap smear abnormality of cervix with LGSIL   ? Routine/ritual circumcision   ? Type II diabetes mellitus ([place])   ? Uveitis   ? ?Review of Systems:   ?Review of Systems  ?Constitutional:  Negative for chills, diaphoresis, fever, malaise/fatigue and weight loss.  ?Eyes:  Negative for blurred vision, double vision and photophobia.  ?Cardiovascular:  Negative for chest pain and palpitations.  ?Gastrointestinal:  Negative for abdominal pain, constipation, diarrhea, nausea and vomiting.  ?Genitourinary:  Positive for frequency. Negative for dysuria, flank pain, hematuria and urgency.  ?Musculoskeletal:  Negative for myalgias.  ?Neurological:  Positive for tingling. Negative for dizziness and headaches.   ? ?Physical Exam: ? ?Vitals:  ? 08/15/21 1546  ?BP: 115/60  ?Pulse: 80  ?Temp: 98 ?F (36.7 ?C)  ?TempSrc: Oral  ?SpO2: 100%  ?Weight: 143 lb 9.6 oz (65.1 kg)  ? ?Physical  Exam ?HENT:  ?   Mouth/Throat:  ?   Mouth: Mucous membranes are moist.  ?   Pharynx: Oropharynx is clear. No oropharyngeal exudate or posterior oropharyngeal erythema.  ?Cardiovascular:  ?   Rate and Rhythm: Normal rate.  ?   Pulses: Normal pulses.  ?   Heart sounds: Normal heart sounds. No murmur heard. ?  No friction rub. No gallop.  ?Pulmonary:  ?   Effort: Pulmonary effort is normal. No respiratory distress.  ?   Breath sounds: Normal breath sounds. No wheezing, rhonchi or rales.  ?Abdominal:  ?   General: Abdomen is flat. Bowel sounds are normal.  ?   Tenderness: There is no abdominal tenderness.  ?Skin: ?   Coloration: Skin is not jaundiced or pale.  ?   Findings: No bruising, erythema, lesion or rash.  ?   Comments: No tenting appreciated  ?Neurological:  ?   Mental Status: She is oriented to person, place, and time.  ?Psychiatric:     ?   Mood and Affect: Mood normal.     ?   Behavior: Behavior normal.  ?  ? ?Assessment & Plan:  ? ?See Encounters Tab for problem based charting. ? ?Patient discussed with Dr.  Saverio Danker ? ?

## 2021-08-15 NOTE — Assessment & Plan Note (Signed)
Patient with history of PAF with RVR, found to be in A-fib with RVR during last hospital stay earlier this month.  She is on Eliquis 5 mg twice daily as well as metoprolol 50 mg daily.  She is taking her Eliquis, but unfortunately does not have metoprolol.  She is currently not tachycardic and her vitals are stable today.  Will restart metoprolol. ?- Continue Eliquis 5 mg twice daily ?- Reordered metoprolol 50 mg daily ?

## 2021-08-15 NOTE — Patient Instructions (Addendum)
[name],  ? ?It was a pleasure meeting you today. Today we discussed your recent hospital stay, and elevated sugars in the clinic. For your sugars, we are awaiting the results of your blood work. Someone will call you from our services in regards to the results for our next steps. We have additionally given you 3 units of short acting insulin in the meantime. Please drink plenty of water and if you feel ill, please come to the ED. Attached to your discharge paperwork will be the medications you are currently taking.  ? ?Have a good day,  ?Maudie Mercury, MD ?

## 2021-08-15 NOTE — Assessment & Plan Note (Addendum)
There were concerns from both patient's daughter and brother and the patient that she has been feeling more depressed and anxious given recent finding in their [place].  Additionally, the patient's family is currently living in a hotel room as they are waiting to find a new house.  Patient used to be on Celexa, and we are unsure why this medication was stopped, but patient and family are willing to restart medication as well as talk to a counselor. ?- Restart Celexa 10 mg daily ?- Referral to behavioral health ?

## 2021-08-15 NOTE — Assessment & Plan Note (Addendum)
Patient with history of uncontrolled diabetes, multiple admissions for DKA, and noncompliant with her current diabetes regimen.  Had a discussion today with family concerning her worsening A1c of greater than 15.5 during her recent hospital stay from 9.2.  Both Jennifer [name] daughter and son are present for this visit.  They are unsure if their mother is actually taking her medications as prescribed.  Both the patient and the family state that sometimes she will take her medications 1-2 times daily, but is unsure if she is taking her long-acting insulin versus her short acting insulin.  We went over her insulin regimen today.  There is concern for worsening depression as there is currently finding from their home country and this has been causing Jennifer [name] on distress.  Although this is more of an acute situation, her greater than 15.5 A1c seems to show at least a history of 3 months noncompliance with her current regimen.  Discussed that we cannot change her regimen, because 1 she did not bring her glucose monitor, and 2 if she is not currently taking her medication as instructed, she risks having hypoglycemic events. ? ?A/P: ?Patient with history of uncontrolled diabetes and multiple admissions for DKA presents to the clinic with a CBG of 552 today.  Currently on physical exam she does not appear to be hypovolemic, she is nontachycardic, she only complains of mild urinary frequency, but is otherwise doing well.  Given her elevated sugars, we did administer 3 units of NovoLog in the clinic. We obtained a BMP in order to see if she has a gap.  We had a thorough discussion on her current regimen in the most importance of following said regimen.  We discussed that depending on what her BMP shows we can attempt to manage her symptoms in the outpatient setting versus being seen in the ED.  Additionally will need to see what her kidney function is showing to see if she is able to take her gabapentin, as her neuropathy  is worsening.  Her daughter and son also request that the patient sees a nutritionist for her diabetes. ?- Referral for nutrition ?- Administered 3 units of NovoLog in the clinic ?- Continue 3 units with meals of NovoLog ?- Continue 15 units of Levemir nightly ?- Bring glucose monitor to next visit ?- If patient has a gap, will need to be seen in the ED for further assessment and possible insulin drip. ?- Depending on lab results, we will restart gabapentin 300 mg nightly for diabetic neuropathy ?-Referral to dietitian. ? ?Addendum: ?- Spoke with patient and family and discussed results.  We will have patient follow-up in clinic for glucose check, and restart gabapentin 300 mg nightly. ?

## 2021-08-16 ENCOUNTER — Encounter: Payer: Self-pay | Admitting: Internal Medicine

## 2021-08-16 DIAGNOSIS — Z59819 Housing instability, housed unspecified: Secondary | ICD-10-CM | POA: Insufficient documentation

## 2021-08-16 MED ORDER — GABAPENTIN 300 MG PO CAPS: 300.0000 mg | ORAL_CAPSULE | Freq: Every day | ORAL | 3 refills | Status: DC

## 2021-08-16 NOTE — Assessment & Plan Note (Signed)
At the end of the visit, daughter stated that the family was currently living in a hotel room while trying to find a house.  She is unsure if the patient is a Education officer, museum, but if she does not think to get a referral for 1.   ? ?A/P ?On chart review it appears that the patient does have a Education officer, museum.  Will reach out to social work for them to contact Ms. Delaguila  ?

## 2021-08-16 NOTE — Assessment & Plan Note (Signed)
Patient has been having difficulty making it to her PT appointments, family is requesting a referral.   ?- referral to PT  ?

## 2021-08-17 NOTE — Progress Notes (Signed)
Internal Medicine Clinic Attending ? ?Case discussed with Dr. Winters  At the time of the visit.  We reviewed the resident?s history and exam and pertinent patient test results.  I agree with the assessment, diagnosis, and plan of care documented in the resident?s note.  ?

## 2021-08-20 ENCOUNTER — Ambulatory Visit: Payer: Self-pay

## 2021-08-21 ENCOUNTER — Encounter: Payer: Medicaid Other | Admitting: Internal Medicine

## 2021-08-22 ENCOUNTER — Ambulatory Visit: Payer: Self-pay | Admitting: Physical Therapy

## 2021-08-22 ENCOUNTER — Encounter: Payer: Medicaid Other | Admitting: Occupational Therapy

## 2021-08-22 ENCOUNTER — Ambulatory Visit: Payer: Medicaid Other | Admitting: Physical Therapy

## 2021-08-28 ENCOUNTER — Ambulatory Visit: Payer: Self-pay | Admitting: *Deleted

## 2021-08-28 ENCOUNTER — Encounter (HOSPITAL_COMMUNITY): Payer: Self-pay | Admitting: Emergency Medicine

## 2021-08-28 ENCOUNTER — Encounter: Payer: Self-pay | Admitting: Internal Medicine

## 2021-08-28 ENCOUNTER — Ambulatory Visit (HOSPITAL_COMMUNITY)
Admission: EM | Admit: 2021-08-28 | Discharge: 2021-08-28 | Disposition: A | Discharge status: Home / Self Care | Payer: Medicaid Other | Attending: Emergency Medicine | Admitting: Emergency Medicine

## 2021-08-28 DIAGNOSIS — H5712 Ocular pain, left eye: Secondary | ICD-10-CM | POA: Diagnosis not present

## 2021-08-28 MED ORDER — POLYMYXIN B-TRIMETHOPRIM 10000-0.1 UNIT/ML-% OP SOLN: 1.0000 [drp] | OPHTHALMIC | 0 refills | Status: DC

## 2021-08-28 MED ORDER — EYE WASH OPHTH SOLN
OPHTHALMIC | Status: AC
  Filled 2021-08-28: qty 118

## 2021-08-28 NOTE — Telephone Encounter (Signed)
?  Chief Complaint: Eye Pain ?Symptoms: Left eye painful 7/10, broken vessels "Like branches" Feels like something is in eye. Daughter states pt will not open eye "Covers with scarf."  Daughter states she would like to check pts BS during call, 167. ?Frequency: This AM ?Pertinent Negatives: Patient denies Swelling, itching ?Disposition: '[]'$ ED /'[x]'$ Urgent Care (no appt availability in office) / '[]'$ Appointment(In office/virtual)/ '[]'$  [place] [facility]/ '[]'$ Home Care/ '[]'$ Refused Recommended Disposition /'[]'$ [facility]/ '[]'$  Follow-up with PCP ?Additional Notes: Directed to UC, states will follow disposition. Daughter translating for pt. [employer] ? ?Reason for Disposition ? MODERATE eye pain or discomfort (e.g., interferes with normal activities or awakens from sleep; more than mild) ? ?Answer Assessment - Initial Assessment Questions ?1. ONSET: "When did the pain start?" (e.g., minutes, hours, days) ?    This morning ?2. TIMING: "Does the pain come and go, or has it been constant since it started?" (e.g., constant, intermittent, fleeting) ?    Constant ?3. SEVERITY: "How bad is the pain?"   (Scale 1-10; mild, moderate or severe) ?  - MILD (1-3): doesn't interfere with normal activities  ?  - MODERATE (4-7): interferes with normal activities or awakens from sleep  ?  - SEVERE (8-10): excruciating pain and patient unable to do normal activities ?    7/10 ?4. LOCATION: "Where does it hurt?"  (e.g., eyelid, eye, cheekbone) ?    Left eye ?5. CAUSE: "What do you think is causing the pain?" ?    Unsure ?6. VISION: "Do you have blurred vision or changes in your vision?"  ?    Cannot see ?7. EYE DISCHARGE: "Is there any discharge (pus) from the eye(s)?"  If yes, ask: "What color is it?"  ?    no ?8. FEVER: "Do you have a fever?" If Yes, ask: "What is it, how was it measured, and when did it start?"  ?   unsure ?9. OTHER SYMPTOMS: "Do you have any other symptoms?" (e.g., headache, nasal discharge, facial  rash) ?    Blister on inside of nose ? ?Protocols used: Eye Pain and Other Symptoms-A-AH ? ?

## 2021-08-28 NOTE — ED Provider Notes (Signed)
?[place] ? ? ? ?CSN: [id number] ?Arrival date & time: 08/28/21  1835 ? ? ?  ? ?History   ?Chief Complaint ?Chief Complaint  ?Patient presents with  ? Eye Problem  ? ? ?HPI ?Jennifer [name] is a 51 y.o. female.  ? ?Patient presents with left eye pain beginning this morning.  Symptoms started abruptly.  Associated sensation of a sandy gritty foreign body.has not attempted treatment of symptoms.  Denies eye redness, itching, drainage, photophobia.  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  History of diabetes with colitis, CBG today 165.  Has upcoming appointment with ophthalmology to begin eye injections. ? ? ?Past Medical History:  ?Diagnosis Date  ? Acute lower GI bleeding 03/06/2021  ? Acute proctitis 03/06/2021  ? Anemia, iron deficiency   ? Atrial fibrillation ([place])   ? Blindness of left eye   ? Closed trimalleolar fracture of right ankle 05/02/2020  ? Added automatically from request for surgery [id number]  ? COVID-19 virus infection 06/15/2020  ? Depression   ? 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 Annotation: secondary to uveitis of unknown etiology Qualifier: Diagnosis of  By: Lazarus Salines, Beth    ? Hair loss   ? History of fracture of clavicle 05/18/2015  ? Hyperlipidemia   ? Hypertension   ? Hypertension associated with diabetes ([place]) 07/23/2016  ? Iron deficiency anemia 05/13/2013  ? Left anterior shoulder pain 07/06/2017  ? NSTEMI (non-ST elevated myocardial infarction) ([place]) 02/04/2021  ? Pap smear abnormality of cervix with LGSIL   ? Routine/ritual circumcision   ? Type II diabetes mellitus ([place])   ? Uveitis   ? ? ?Patient Active Problem List  ? Diagnosis Date Noted  ? Housing instability, housed unspecified 08/16/2021  ? Atrial fibrillation with RVR ([place])   ? DKA (diabetic ketoacidosis) ([place])  07/26/2021  ? Grade I diastolic dysfunction 91/47/8295  ? Paroxysmal atrial fibrillation with RVR ([place]) 07/26/2021  ? Thrombocytopenia ([place]) 07/26/2021  ? Elevated troponin   ? History of stroke 05/05/2021  ? Labile blood glucose   ? Uncontrolled type 2 diabetes mellitus with hyperglycemia ([place])   ? Acute blood loss anemia   ? Debility 03/17/2021  ? Glaucoma 03/06/2021  ? At risk for medication noncompliance 03/06/2021  ? Physical deconditioning 01/05/2021  ? 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 Orthostatic hypotension 11/22/2020  ? Lower extremity weakness 11/22/2020  ? CKD (chronic kidney disease) stage 3, GFR 30-59 ml/min (HCC) 11/22/2020  ? Depression 08/22/2020  ? Weight loss, non-intentional 07/04/2020  ? Adult failure to thrive 07/04/2020  ? Hyperglycemia 06/15/2020  ? Early satiety 05/31/2020  ? PAF (paroxysmal atrial fibrillation) ([place]) 09/20/2018  ? Migraine   ? (HFpEF) heart failure with preserved ejection fraction ([place]) 01/01/2018  ? 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  ? Pseudophakia of right eye 09/16/2017  ? 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 Fatigue 04/02/2016  ? 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 Vitamin D deficiency 10/30/2014  ? Diabetic neuropathy ([place]) 05/13/2013  ? Healthcare maintenance 02/26/2011  ? Hyperlipidemia 05/05/2006  ? ? ?Past Surgical History:  ?Procedure Laterality Date  ? CESAREAN SECTION    ? x 1  ? EYE SURGERY Bilateral   ? Lasik  ? LEFT HEART CATH AND CORONARY ANGIOGRAPHY N/A 02/05/2021  ?  Procedure: LEFT HEART CATH AND CORONARY ANGIOGRAPHY;  Surgeon: Dixie Dials, MD;  Location: [facility];  Service: Cardiovascular;  Laterality: N/A;  ? MULTIPLE TOOTH EXTRACTIONS    ? bottom denture  ? ORIF ANKLE FRACTURE Right 05/05/2020  ? Procedure: OPEN REDUCTION INTERNAL FIXATION (ORIF) ANKLE FRACTURE;  Surgeon: Shona Needles, MD;  Location: [facility];  Service: Orthopedics;  Laterality: Right;  ? ? ?OB History   ? ?  Gravida  ?7  ? Para  ?4  ? Term  ?4  ? Preterm  ?0  ? AB  ?3  ? Living  ?   ?  ? ? SAB  ?3  ? IAB  ?0  ? Ectopic  ?0  ? Multiple  ?   ? Live Births  ?   ?   ?  ?  ? ? ? ?Home Medications   ? ?Prior to Admission medications   ?Medication Sig Start Date End Date Taking? Authorizing Provider  ?acetaminophen (TYLENOL) 325 MG tablet Take 2 tablets (650 mg total) by mouth every 6 (six) hours as needed for mild pain (or Fever >/= 101). 04/03/21   Angiulli, Lavon Paganini, PA-C  ?apixaban (ELIQUIS) 5 MG TABS tablet Take 1 tablet (5 mg total) by mouth 2 (two) times daily. 04/03/21   Angiulli, Lavon Paganini, PA-C  ?brimonidine (ALPHAGAN) 0.2 % ophthalmic solution Place 1 drop into the left eye 3 (three) times daily. 04/03/21   Angiulli, Lavon Paganini, PA-C  ?calcium citrate-vitamin D (CITRACAL+D) 315-200 MG-UNIT tablet Take 2 tablets by mouth 2 (two) times daily. 04/03/21 04/03/22  Angiulli, Lavon Paganini, PA-C  ?citalopram (CELEXA) 10 MG tablet Take 1 tablet (10 mg total) by mouth daily. 08/15/21 08/15/22  Maudie Mercury, MD  ?dorzolamide-timolol (COSOPT) 22.3-6.8 MG/ML ophthalmic solution Place 1 drop into the left eye 2 (two) times daily. 04/03/21   Angiulli, Lavon Paganini, PA-C  ?gabapentin (NEURONTIN) 300 MG capsule Take 1 capsule (300 mg total) by mouth at bedtime. 08/16/21 08/16/22  Maudie Mercury, MD  ?insulin aspart (NOVOLOG FLEXPEN) 100 UNIT/ML FlexPen Inject 3 Units into the skin 3 (three) times daily with meals. 04/14/21   Meredith Staggers, MD  ?insulin detemir (LEVEMIR) 100 UNIT/ML FlexPen Inject 15 Units into the skin at bedtime. 05/29/21   Raulkar, Clide Deutscher, MD  ?Insulin Pen Needle (BD PEN NEEDLE NANO 2ND GEN) 32G X 4 MM MISC 1 each by Does not apply route as needed. 06/29/21   Raulkar, Clide Deutscher, MD  ?Insulin Pen Needle (PEN NEEDLES) 32G X 4 MM MISC Use 4 times daily to inject insulin. 06/27/21   Jose Persia, MD  ?metoprolol succinate (TOPROL-XL) 50 MG 24 hr tablet Take 1 tablet (50 mg total) by mouth daily. Take with or immediately  following a meal. 08/15/21   Maudie Mercury, MD  ?Multiple Vitamin (MULTIVITAMIN WITH MINERALS) TABS tablet Take 1 tablet by mouth daily. 03/18/21   Debbe Odea, MD  ?rosuvastatin (CRESTOR) 20 MG tablet Take 1 tablet (20 mg total) by mouth daily. 08/15/21 08/15/22  Maudie Mercury, MD  ?Continuous Blood Gluc Sensor (DEXCOM G6 SENSOR) MISC Use to check blood sugar at least 6 times a day ?Patient not taking: Reported on 08/29/2020 08/09/20 09/27/20  Harvie Heck, MD  ?Continuous Blood Gluc Transmit (DEXCOM G6 TRANSMITTER) MISC Use to check blood sugar at least 6 times a day ?Patient not taking: Reported on 08/29/2020 08/09/20 09/27/20  Harvie Heck, MD  ? ? ?Family History ?Family History  ?Problem Relation Age of Onset  ?  Diabetes Father   ? Hypertension Other   ? ? ?Social History ?Social History  ? ?Tobacco Use  ? Smoking status: Never  ? Smokeless tobacco: Never  ?Vaping Use  ? Vaping Use: Never used  ?Substance Use Topics  ? Alcohol use: No  ? Drug use: No  ? ? ? ?Allergies   ?Patient has no known allergies. ? ? ?Review of Systems ?Review of Systems ?Defer to HPI  ? ?Physical Exam ?Triage Vital Signs ?ED Triage Vitals  ?Enc Vitals Group  ?   BP 08/28/21 1913 98/64  ?   Pulse Rate 08/28/21 1913 60  ?   Resp 08/28/21 1913 19  ?   Temp 08/28/21 1913 98.8 ?F (37.1 ?C)  ?   Temp Source 08/28/21 1913 Oral  ?   SpO2 08/28/21 1913 96 %  ?   Weight --   ?   Height --   ?   Head Circumference --   ?   Peak Flow --   ?   Pain Score 08/28/21 1912 8  ?   Pain Loc --   ?   Pain Edu? --   ?   Excl. in [place]? --   ? ?No data found. ? ?Updated Vital Signs ?BP 98/64 (BP Location: Right Arm)   Pulse 60   Temp 98.8 ?F (37.1 ?C) (Oral)   Resp 19   LMP 08/21/2016 (Exact Date)   SpO2 96%  ? ?Visual Acuity ?Right Eye Distance:   ?Left Eye Distance:   ?Bilateral Distance:   ? ?Right Eye Near:   ?Left Eye Near:    ?Bilateral Near:    ? ?Physical Exam ?Constitutional:   ?   Appearance: Normal appearance.  ?HENT:  ?   Head: Normocephalic.  ?Eyes:   ?   Comments: Cloudy iris, corneal abrasion not noted, no presence of foreign body, extraocular movements intact, no erythema or drainage noted  ?Pulmonary:  ?   Effort: Pulmonary effort is normal.  ?Skin: ?

## 2021-08-28 NOTE — Discharge Instructions (Signed)
The cause of your symptoms is unknown at this time however since you have several conditions to the left eye I would recommend follow-up with your ophthalmologist as soon as possible for evaluation, please notify them tomorrow morning ? ?We will prophylactically start bacterial eyedrops, place 1 drop in the eye every 4 hours for the next 7 days ? ?May continue to rinse the eye with eyewash given by urgent care to decrease the sensation of objects in eye ? ?May apply a cool compress to the eye for general comfort ? ?May take Tylenol every 6 hours as needed for pain ?

## 2021-08-28 NOTE — ED Triage Notes (Signed)
Pt has diabetes and glaucoma in left eye. C/o left eye pain and "feels like lots of tiny branches in her eye".  ?

## 2021-08-29 ENCOUNTER — Ambulatory Visit: Payer: Medicaid Other | Admitting: Physical Therapy

## 2021-08-29 ENCOUNTER — Encounter: Payer: Medicaid Other | Admitting: Occupational Therapy

## 2021-09-04 ENCOUNTER — Encounter: Payer: Medicaid Other | Admitting: Internal Medicine

## 2021-09-05 ENCOUNTER — Ambulatory Visit: Payer: Medicaid Other | Admitting: Physical Therapy

## 2021-09-05 ENCOUNTER — Encounter: Payer: Medicaid Other | Admitting: Internal Medicine

## 2021-09-05 ENCOUNTER — Encounter: Payer: Medicaid Other | Admitting: Dietician

## 2021-09-06 ENCOUNTER — Ambulatory Visit (INDEPENDENT_AMBULATORY_CARE_PROVIDER_SITE_OTHER): Payer: Medicaid Other | Admitting: Internal Medicine

## 2021-09-06 ENCOUNTER — Other Ambulatory Visit: Payer: Self-pay

## 2021-09-06 ENCOUNTER — Encounter: Payer: Self-pay | Admitting: Internal Medicine

## 2021-09-06 ENCOUNTER — Ambulatory Visit: Payer: Medicaid Other | Admitting: Dietician

## 2021-09-06 VITALS — BP 118/60 | HR 78 | Temp 98.1°F | Wt 154.1 lb

## 2021-09-06 DIAGNOSIS — I878 Other specified disorders of veins: Secondary | ICD-10-CM

## 2021-09-06 DIAGNOSIS — E1151 Type 2 diabetes mellitus with diabetic peripheral angiopathy without gangrene: Secondary | ICD-10-CM

## 2021-09-06 DIAGNOSIS — E1165 Type 2 diabetes mellitus with hyperglycemia: Secondary | ICD-10-CM | POA: Diagnosis not present

## 2021-09-06 DIAGNOSIS — N1831 Chronic kidney disease, stage 3a: Secondary | ICD-10-CM | POA: Diagnosis not present

## 2021-09-06 DIAGNOSIS — Z794 Long term (current) use of insulin: Secondary | ICD-10-CM

## 2021-09-06 DIAGNOSIS — E1122 Type 2 diabetes mellitus with diabetic chronic kidney disease: Secondary | ICD-10-CM

## 2021-09-06 LAB — GLUCOSE, CAPILLARY: Glucose-Capillary: 299 mg/dL — ABNORMAL HIGH (ref 70–99)

## 2021-09-06 NOTE — Progress Notes (Signed)
Patient left the office without seeing me.  Debera Lat, RD 09/07/2021 12:07 PM.

## 2021-09-06 NOTE — Patient Instructions (Addendum)
The swelling in your legs is from something called venous insufficiency.  -Elevate your legs and use your compression socks to reduce the swelling in your legs.

## 2021-09-07 ENCOUNTER — Other Ambulatory Visit: Payer: Self-pay | Admitting: Internal Medicine

## 2021-09-07 DIAGNOSIS — F32A Depression, unspecified: Secondary | ICD-10-CM

## 2021-09-07 DIAGNOSIS — I878 Other specified disorders of veins: Secondary | ICD-10-CM | POA: Insufficient documentation

## 2021-09-07 LAB — BASIC METABOLIC PANEL
BUN/Creatinine Ratio: 38 — ABNORMAL HIGH (ref 9–23)
BUN: 41 mg/dL — ABNORMAL HIGH (ref 6–24)
CO2: 23 mmol/L (ref 20–29)
Calcium: 9 mg/dL (ref 8.7–10.2)
Chloride: 100 mmol/L (ref 96–106)
Creatinine, Ser: 1.08 mg/dL — ABNORMAL HIGH (ref 0.57–1.00)
Glucose: 299 mg/dL — ABNORMAL HIGH (ref 70–99)
Potassium: 5.1 mmol/L (ref 3.5–5.2)
Sodium: 136 mmol/L (ref 134–144)
eGFR: 63 mL/min/{1.73_m2} (ref >59)

## 2021-09-07 NOTE — Assessment & Plan Note (Signed)
Renal function electrolytes remained stable on labs today.

## 2021-09-07 NOTE — Assessment & Plan Note (Signed)
She presents to the office today for evaluation of lower extremity swelling over the past few days.  She has a longstanding history of chronic venous stasis.  She has been instructed to wear compression socks and elevate her legs in the past however has been noncompliant.  Weight is noted to be increased from its last reading however upon further investigation, it is stable from prior readings obtained in our clinic.  The last weight reading was obtained during a hospitalization for an unrelated issue.  She denies increased dyspnea, orthopnea, cough, or increased abdominal girth.  Exam findings consistent with chronic venous stasis. - Reviewed recommendations including leg elevation and compression socks with her

## 2021-09-07 NOTE — Assessment & Plan Note (Signed)
Patient did bring her glucometer to the visit today however was not engaged in discussing diabetic management and wanted to focus on her lower extremity swelling.  She is unsure of her current medication dosing. Glucometer download reveals that she checks her blood sugar only once a day, despite being on mealtime insulin.  Morning glucoses range from 160 to about 200.  No hypoglycemic events noted. She was supposed to meet with [name] after our visit today however left prior to meeting. - No medication changes made today.  She is encouraged to check her blood sugars 3 times a day to assist with making medication adjustments.  Unfortunately, despite extensive education and encouragement, she remains poorly engaged in diabetic management. - Encouraged to follow-up once she has been checking her blood sugar at least 3 times daily at home so we can better assess her insulin needs.

## 2021-09-07 NOTE — Progress Notes (Signed)
   Office Visit   [name], female    DOB: [birth year], 51 y.o.   MRN: [id number]   PCP: Jose Persia, MD   Subjective:  CC: Leg Swelling   Jennifer [name] is a 51 y.o. year old female who presents for the above medical condition(s). Please refer to problem based charting for assessment and plan.    Objective:   BP 118/60 (BP Location: Left Arm, Patient Position: Sitting, Cuff Size: Normal)   Pulse 78   Temp 98.1 F (36.7 C) (Oral)   Wt 154 lb 1.6 oz (69.9 kg)   LMP 08/21/2016 (Exact Date)   SpO2 100%   BMI 24.87 kg/m   General: Chronically ill-appearing female sitting in transport chair Cardiac: Heart regular rate and rhythm Pulm: Lungs clear MSK: +1 pitting edema in the bilateral lower extremities extending to mid calf with chronic venous stasis skin changes.  No pain on palpation of the lower legs Assessment & Plan:   Problem List Items Addressed This Visit       Endocrine   Uncontrolled type 2 diabetes mellitus with hyperglycemia ([place])    Patient did bring her glucometer to the visit today however was not engaged in discussing diabetic management and wanted to focus on her lower extremity swelling.  She is unsure of her current medication dosing. Glucometer download reveals that she checks her blood sugar only once a day, despite being on mealtime insulin.  Morning glucoses range from 160 to about 200.  No hypoglycemic events noted. She was supposed to meet with [name] after our visit today however left prior to meeting. - No medication changes made today.  She is encouraged to check her blood sugars 3 times a day to assist with making medication adjustments.  Unfortunately, despite extensive education and encouragement, she remains poorly engaged in diabetic management. - Encouraged to follow-up once she has been checking her blood sugar at least 3 times daily at home so we can better assess her insulin needs.         Genitourinary   CKD (chronic  kidney disease) stage 3, GFR 30-59 ml/min (HCC) - Primary    Renal function electrolytes remained stable on labs today.       Relevant Orders   Basic metabolic panel (Completed)     Other   Venous stasis    She presents to the office today for evaluation of lower extremity swelling over the past few days.  She has a longstanding history of chronic venous stasis.  She has been instructed to wear compression socks and elevate her legs in the past however has been noncompliant.  Weight is noted to be increased from its last reading however upon further investigation, it is stable from prior readings obtained in our clinic.  The last weight reading was obtained during a hospitalization for an unrelated issue.  She denies increased dyspnea, orthopnea, cough, or increased abdominal girth.  Exam findings consistent with chronic venous stasis. - Reviewed recommendations including leg elevation and compression socks with her       Follow-up after 2 weeks of 3 times daily glucose checks for ongoing medication management   Pt discussed with Dr. Elwanda Brooklyn, MD Internal Medicine Resident PGY-3 [facility] 09/07/2021 5:58 PM

## 2021-09-10 NOTE — Progress Notes (Signed)
Internal Medicine Clinic Attending  Case discussed with Dr. Christian  At the time of the visit.  We reviewed the resident's history and exam and pertinent patient test results.  I agree with the assessment, diagnosis, and plan of care documented in the resident's note.  

## 2021-09-12 ENCOUNTER — Ambulatory Visit: Payer: Medicaid Other | Admitting: Physical Therapy

## 2021-09-12 ENCOUNTER — Other Ambulatory Visit: Payer: Self-pay | Admitting: Obstetrics and Gynecology

## 2021-09-12 ENCOUNTER — Telehealth: Payer: Self-pay

## 2021-09-12 DIAGNOSIS — E0810 Diabetes mellitus due to underlying condition with ketoacidosis without coma: Secondary | ICD-10-CM

## 2021-09-12 NOTE — Telephone Encounter (Signed)
   Telephone encounter was:  Successful.  09/12/2021 Name: [name] MRN: [id number] DOB: [birth year]  Jennifer [name] is a 51 y.o. year old female who is a primary care patient of Jose Persia, MD . The community resource team was consulted for assistance with Food Insecurity and Financial Difficulties related to financial strain  Care guide performed the following interventions: Patient provided with information about care guide support team and interviewed to confirm resource needs.Patients Daughter stated her mom needs housing and financial resources as well as food and support group or therapy. I will be emailing resources as requested  Follow Up Plan:  Care guide will follow up with patient by phone over the next day    [facility], Care Management  [phone] [address], [place], [place] [address] Phone: [phone] Email: [email or web address]

## 2021-09-12 NOTE — Patient Outreach (Signed)
Medicaid Managed Care   Nurse Care Manager Note  09/12/2021 Name:  Jennifer [name] MRN:  [id number] DOB:  1971-02-06  Jennifer [name] is an 51 y.o. year old female who is a primary patient of Jose Persia, MD.  The [facility] Managed Care Coordination team was consulted for assistance with:    Chronic healthcare management needs, DM, HTN, migraines, depression, HLD, vision loss  Jennifer [name] / patient's daughter/[name] was given information about Medicaid Managed Care Coordination team services today. Jennifer [name] [employer] ([name]) agreed to services and verbal consent obtained.  Engaged with patient/ patient's daughter by telephone for follow up visit in response to provider referral for case management and/or care coordination services.   Assessments/Interventions:  Review of past medical history, allergies, medications, health status, including review of consultants reports, laboratory and other test data, was performed as part of comprehensive evaluation and provision of chronic care management services.  SDOH (Social Determinants of Health) assessments and interventions performed:  Care Plan  No Known Allergies  Medications Reviewed Today     Reviewed by [name], RN (Registered Nurse) on 09/12/21 at 37  Med List Status: <None>   Medication Order Taking? Sig Documenting Provider Last Dose Status Informant  acetaminophen (TYLENOL) 325 MG tablet [id number] No Take 2 tablets (650 mg total) by mouth every 6 (six) hours as needed for mild pain (or Fever >/= 101). Angiulli, Lavon Paganini, PA-C unk Active Self  apixaban (ELIQUIS) 5 MG TABS tablet [id number] No Take 1 tablet (5 mg total) by mouth 2 (two) times daily. Cathlyn Parsons, PA-C 07/25/2021 0800 Active Self  brimonidine (ALPHAGAN) 0.2 % ophthalmic solution [id number] No Place 1 drop into the left eye 3 (three) times daily. Cathlyn Parsons, PA-C 07/25/2021 Active Self  calcium citrate-vitamin D (CITRACAL+D) 315-200  MG-UNIT tablet [id number] No Take 2 tablets by mouth 2 (two) times daily. Cathlyn Parsons, PA-C 07/25/2021 Active Self  citalopram (CELEXA) 10 MG tablet [id number]  TAKE 1 TABLET BY MOUTH EVERY DAY Jose Persia, MD  Active   Patient not taking:  Discontinued 09/27/20 0116   Patient not taking:  Discontinued 09/27/20 0116   dorzolamide-timolol (COSOPT) 22.3-6.8 MG/ML ophthalmic solution [id number] No Place 1 drop into the left eye 2 (two) times daily. Cathlyn Parsons, PA-C 07/25/2021 Active Self  gabapentin (NEURONTIN) 300 MG capsule [id number]  Take 1 capsule (300 mg total) by mouth at bedtime. Maudie Mercury, MD  Active   insulin aspart (NOVOLOG FLEXPEN) 100 UNIT/ML FlexPen [id number] No Inject 3 Units into the skin 3 (three) times daily with meals. Meredith Staggers, MD 07/25/2021 Active Self  insulin detemir (LEVEMIR) 100 UNIT/ML FlexPen [id number] No Inject 15 Units into the skin at bedtime. Izora Ribas, MD 07/24/2021 Active Self  Insulin Pen Needle (BD PEN NEEDLE NANO 2ND GEN) 32G X 4 MM MISC [id number] No 1 each by Does not apply route as needed. Izora Ribas, MD Taking Active Self  Insulin Pen Needle (PEN NEEDLES) 32G X 4 MM MISC [id number] No Use 4 times daily to inject insulin. Jose Persia, MD Taking Active Self  metoprolol succinate (TOPROL-XL) 50 MG 24 hr tablet [id number]  Take 1 tablet (50 mg total) by mouth daily. Take with or immediately following a meal. Maudie Mercury, MD  Active   Multiple Vitamin (MULTIVITAMIN WITH MINERALS) TABS tablet [id number] No Take 1 tablet by mouth daily. Debbe Odea, MD Past Week Active Self  rosuvastatin (CRESTOR) 20 MG tablet [id number]  Take 1 tablet (20  mg total) by mouth daily. Maudie Mercury, MD  Active   trimethoprim-polymyxin b [facility]) ophthalmic solution [id number]  Place 1 drop into the left eye every 4 (four) hours. Hans Eden, NP  Active   Med List Note Victoriano Lain, CPhT 10/02/20 2155): Daughter can help            Patient Active Problem List   Diagnosis Date Noted   Venous stasis 09/07/2021   Housing instability, housed unspecified 08/16/2021   Atrial fibrillation with RVR (HCC)    Grade I diastolic dysfunction 32/03/2481   Paroxysmal atrial fibrillation with RVR ([place]) 07/26/2021   Thrombocytopenia ([place]) 07/26/2021   Elevated troponin    History of stroke 05/05/2021   Labile blood glucose    Uncontrolled type 2 diabetes mellitus with hyperglycemia (HCC)    Acute blood loss anemia    Debility 03/17/2021   Glaucoma 03/06/2021   At risk for medication noncompliance 03/06/2021   Physical deconditioning 01/05/2021   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 ([place]) 11/29/2020   Orthostatic hypotension 11/22/2020   Lower extremity weakness 11/22/2020   CKD (chronic kidney disease) stage 3, GFR 30-59 ml/min ([place]) 11/22/2020   Depression 08/22/2020   Weight loss, non-intentional 07/04/2020   Adult failure to thrive 07/04/2020   Hyperglycemia 06/15/2020   Early satiety 05/31/2020   PAF (paroxysmal atrial fibrillation) ([place]) 09/20/2018   Migraine    (HFpEF) heart failure with preserved ejection fraction ([place]) 01/01/2018   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   Pseudophakia of right eye 09/16/2017   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   Fatigue 04/02/2016   Uveitic glaucoma of left eye, 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   Vitamin D deficiency 10/30/2014   Diabetic neuropathy ([place]) 05/13/2013   Healthcare maintenance 02/26/2011   Hyperlipidemia 05/05/2006   Conditions to be addressed/monitored per PCP order:  Chronic healthcare management needs, DM, HTN, migraines, depression, HLD, vision loss  Care Plan : General Plan of Care (Adult)  Updates made by [name], RN since 09/12/2021 12:00 AM     Problem: Health Promotion or Disease Self-Management (General Plan of Care)   Priority: High  Onset Date: 08/29/2020     Long-Range Goal: Self-Management Plan Developed   Start Date:  08/29/2020  Expected End Date: 12/13/2021  Recent Progress: Not on track  Priority: High  Note:   Current Barriers:  Ineffective Self Health Maintenance Patient with diabetes, hypertension, deconditioning, depression Currently UNABLE TO independently self manage needs related to chronic health conditions.  Knowledge Deficits related to short term plan for care coordination needs and long term plans for chronic disease management needs 09/12/21:  Patient checks blood sugar maybe once a day, is recommended three times a day-blood sugars 100s per patient's daughter.  Has ? Nurse coming into the home M-Th to assist.  Most recent A1C-15.5 on 07/26/21-patient missed nutrition appt-daughter to reschedule.  Continues to have lower extremity swelling for which she is to wear compression socks and elevate-is not using compression socks per patient's daughter-she will order.  Continues to see eye provider for vision loss.  Noncompliance and depression-to see Dr. Theodis Shove 09/24/21-patient's daughter reminded. Patient currently living in motel with family. Nurse Case Manager Clinical Goal(s):  patient will work with care management team to address care coordination and chronic disease management needs related to Disease Management Educational Needs Care Coordination Medication Management and Education Medication Reconciliation Medication Assistance  Psychosocial Support Mental Health Counseling   Patient will follow provider recommendations in checking blood  sugar three times a day, wearing compression socks and elevating feet,legs, attend BH and nutrition appts. Interventions:  Evaluation of current treatment plan and patient's adherence to plan as established by provider. Reviewed medications with patient/patient's daughter. Collaborated with Pharmacy Pharmacy referral for medication review. Collaborated with Social Work regarding depression.  Referral for financial resources-needs rent  assistance. Discussed plans with patient for ongoing care management follow up and provided patient with direct contact information for care management team Reviewed scheduled/upcoming provider appointments.  Social Work referral for depression. Patient's daughter given [facility] outpatient PT phone number to call for an appointment. Collaborated with PCP regarding blood sugars, PT. Care Guide referral for housing resources. Collaborated with Care Guide Self Care Activities:  Patient will self administer medications as prescribed Patient will attend all scheduled provider appointments Patient/DPRwill call pharmacy for medication refills Patient/[name] will attend church or other social activities Patient/[name] will continue to perform ADL's independently Patient/[name] will call provider office for new concerns or questions Patient will work Social Work to address care coordination needs and will continue to work with the clinical team to address health care and disease management related needs.   Patient Goals: In the next 30 days, patient will attend all provider appointments. In the next 30 days, patient will take medications as directed. In the next 30 days, patient/patient's daughter  will speak with Pharmacist.  Follow Up Plan: [name] has been provided with contact information for the care management team and has been advised to call with any health related questions or concerns.  The care management team will reach out to the patient/patient's daughter.again over the next 30 days.     Follow Up:  Patient / Patient's daughter agrees to Care Plan and Follow-up.  Plan: The Managed Medicaid care management team will reach out to the patient / patient's daughter again over the next 30 days. and The  [employer] ([name]) has been provided with contact information for the Managed Medicaid care management team and has been advised to call with any health related  questions or concerns.  Date/time of next scheduled RN care management/care coordination outreach: 10/11/21 at 1230.

## 2021-09-12 NOTE — Patient Instructions (Signed)
Hi Jennifer [name], thank you for speaking with me today about your Mother-have a nice day!!  Jennifer [name] patient's daughter  was given information about Medicaid Managed Care team care coordination services as a part of their [place] Medicaid benefit. Jennifer [name]/ patient's daughter  verbally consented to engagement with the [facility] Managed Care team.   If you are experiencing a medical emergency, please call 911 or report to your local emergency department or urgent care.   If you have a non-emergency medical problem during routine business hours, please contact your provider's office and ask to speak with a nurse.   For questions related to your [facility], please call: [phone] or visit the homepage here: [email or web address]  If you would like to schedule transportation through your [facility], please call the following number at least 2 days in advance of your appointment: [phone].  Rides for urgent appointments can also be made after hours by calling Member Services.  Call the [place] at [phone], at any time, 24 hours a day, 7 days a week. If you are in danger or need immediate medical attention call 911.  If you would like help to quit smoking, call 1-800-QUIT-NOW 269-822-9718) OR Espaol: 1-855-Djelo-Ya (8-315-176-1607) o para ms informacin haga clic aqu or Text READY to 200-400 to register via text  [name] - following are the goals we discussed in your visit today:   Goals Addressed             This Visit's Progress    Protect My Health       Timeframe:  Long-Range Goal Priority:  High Start Date:       08/29/20                      Expected End Date:  ongoing           Follow Up Date:  10/12/21   - schedule appointment for flu shot - schedule appointment for vaccines needed due to my age or health -  schedule recommended health tests (blood work, mammogram, colonoscopy, pap test) - schedule and keep appointment for annual check-up   09/12/21:  Recent PCP and Ophthalmology appt, patient's daughter to reschedule missed nutrition appointment.  Has BH appt 09/24/21.   Why is this important?   Screening tests can find diseases early when they are easier to treat.  Your doctor or nurse will talk with you about which tests are important for you.  Getting shots for common diseases like the flu and shingles will help prevent them.     Patient / Patient's daughter verbalizes understanding of instructions and care plan provided today and agrees to view in [place]. Active MyChart status and patient understanding of how to access instructions and care plan via MyChart confirmed with patient.     The Managed Medicaid care management team will reach out to the patient / patient's daughter again over the next 30 days.  The  [employer] ([name]) has been provided with contact information for the Managed Medicaid care management team and has been advised to call with any health related questions or concerns.   [name] RN, BSN [place] Management Coordinator - Managed Medicaid High Risk [phone]   Following is a copy of your plan of care:  Care Plan : General Plan of Care (Adult)  Updates made by Kamari Buch, Lorel Monaco, RN since  09/12/2021 12:00 AM     Problem: Health Promotion or Disease Self-Management (General Plan of Care)   Priority: High  Onset Date: 08/29/2020     Long-Range Goal: Self-Management Plan Developed   Start Date: 08/29/2020  Expected End Date: 12/13/2021  Recent Progress: Not on track  Priority: High  Note:   Current Barriers:  Ineffective Self Health Maintenance Patient with diabetes, hypertension, deconditioning, depression Currently UNABLE TO independently self manage needs related to chronic health conditions.  Knowledge Deficits  related to short term plan for care coordination needs and long term plans for chronic disease management needs 09/12/21:  Patient checks blood sugar maybe once a day, is recommended three times a day-blood sugars 100s per patient's daughter.  Has ? Nurse coming into the home M-Th to assist.  Most recent A1C-15.5 on 07/26/21-patient missed nutrition appt-daughter to reschedule.  Continues to have lower extremity swelling for which she is to wear compression socks and elevate-is not using compression socks per patient's daughter-she will order.  Continues to see eye provider for vision loss.  Noncompliance and depression-to see Dr. Theodis Shove 09/24/21-patient's daughter reminded. Patient currently living in motel with family. Nurse Case Manager Clinical Goal(s):  patient will work with care management team to address care coordination and chronic disease management needs related to Disease Management Educational Needs Care Coordination Medication Management and Education Medication Reconciliation Medication Assistance  Psychosocial Support Mental Health Counseling   Patient will follow provider recommendations in checking blood sugar three times a day, wearing compression socks and elevating feet,legs, attend [place] and nutrition appts. Interventions:  Evaluation of current treatment plan and patient's adherence to plan as established by provider. Reviewed medications with patient/patient's daughter. Collaborated with Pharmacy Pharmacy referral for medication review. Collaborated with Social Work regarding depression.  Referral for financial resources-needs rent assistance. Discussed plans with patient for ongoing care management follow up and provided patient with direct contact information for care management team Reviewed scheduled/upcoming provider appointments.  Social Work referral for depression. Patient's daughter given [facility] outpatient PT phone number to call for an  appointment. Collaborated with PCP regarding blood sugars, PT. Care Guide referral for housing resources. Collaborated with Care Guide Self Care Activities:  Patient will self administer medications as prescribed Patient will attend all scheduled provider appointments Patient/DPRwill call pharmacy for medication refills Patient/[name] will attend church or other social activities Patient/[name] will continue to perform ADL's independently Patient/[name] will call provider office for new concerns or questions Patient will work Social Work to address care coordination needs and will continue to work with the clinical team to address health care and disease management related needs.   Patient Goals: In the next 30 days, patient will attend all provider appointments. In the next 30 days, patient will take medications as directed. In the next 30 days, patient/patient's daughter  will speak with Pharmacist.  Follow Up Plan: [name] has been provided with contact information for the care management team and has been advised to call with any health related questions or concerns.  The care management team will reach out to the patient/patient's daughter.again over the next 30 days.

## 2021-09-18 ENCOUNTER — Telehealth: Payer: Self-pay

## 2021-09-18 NOTE — Telephone Encounter (Signed)
   Telephone encounter was:  Successful.  09/18/2021 Name: Jennifer [name] MRN: [id number] DOB: May 09, 1970  Jennifer [name] is a 50 y.o. year old female who is a primary care patient of Jose Persia, MD . The community resource team was consulted for assistance with Food Insecurity and [place] guide performed the following interventions: Patient provided with information about care guide support team and interviewed to confirm resource needs.Patient did receive emailed resouces  Follow Up Plan:  No further follow up planned at this time. The patient has been provided with needed resources.    [place], Care Management  [phone] [address], [place], [place] [address] Phone: [phone] Email: [email or web address]

## 2021-09-24 ENCOUNTER — Ambulatory Visit: Payer: Medicaid Other | Admitting: Behavioral Health

## 2021-09-24 ENCOUNTER — Other Ambulatory Visit: Payer: Self-pay | Admitting: *Deleted

## 2021-09-24 DIAGNOSIS — F32A Depression, unspecified: Secondary | ICD-10-CM

## 2021-09-24 DIAGNOSIS — E11319 Type 2 diabetes mellitus with unspecified diabetic retinopathy without macular edema: Secondary | ICD-10-CM

## 2021-09-24 MED ORDER — BD PEN NEEDLE NANO 2ND GEN 32G X 4 MM MISC: 3 refills | Status: AC

## 2021-09-24 MED ORDER — INSULIN DETEMIR 100 UNIT/ML FLEXPEN: 15.0000 [IU] | PEN_INJECTOR | Freq: Every day | SUBCUTANEOUS | 2 refills | Status: AC

## 2021-09-24 MED ORDER — APIXABAN 5 MG PO TABS: 5.0000 mg | ORAL_TABLET | Freq: Two times a day (BID) | ORAL | 2 refills | Status: AC

## 2021-09-24 MED ORDER — CITALOPRAM HYDROBROMIDE 10 MG PO TABS: 10.0000 mg | ORAL_TABLET | Freq: Every day | ORAL | 2 refills | Status: AC

## 2021-09-24 MED ORDER — METOPROLOL SUCCINATE ER 50 MG PO TB24: 50.0000 mg | ORAL_TABLET | Freq: Every day | ORAL | 2 refills | Status: AC

## 2021-09-24 MED ORDER — BRIMONIDINE TARTRATE 0.2 % OP SOLN: 1.0000 [drp] | Freq: Three times a day (TID) | OPHTHALMIC | 2 refills | Status: AC

## 2021-09-24 MED ORDER — POLYMYXIN B-TRIMETHOPRIM 10000-0.1 UNIT/ML-% OP SOLN: 1.0000 [drp] | OPHTHALMIC | 0 refills | Status: AC

## 2021-09-24 MED ORDER — DORZOLAMIDE HCL-TIMOLOL MAL 2-0.5 % OP SOLN: 1.0000 [drp] | Freq: Two times a day (BID) | OPHTHALMIC | 2 refills | Status: AC

## 2021-09-24 MED ORDER — GABAPENTIN 300 MG PO CAPS: 300.0000 mg | ORAL_CAPSULE | Freq: Every day | ORAL | 2 refills | Status: AC

## 2021-09-24 MED ORDER — NOVOLOG FLEXPEN 100 UNIT/ML ~~LOC~~ SOPN: 3.0000 [IU] | PEN_INJECTOR | Freq: Three times a day (TID) | SUBCUTANEOUS | 2 refills | Status: AC

## 2021-09-24 NOTE — Telephone Encounter (Signed)
Refilled all medications with the exception of Multivitamin and Tylenol as these are OTC.

## 2021-09-24 NOTE — BH Specialist Note (Deleted)
Patient is moving to [place] all meds were accidentally packed and sent to [place] .  Needs refill on all meds until she gets established. Talked with Dr. Jimmye Norman who asked that you or the team do 30 days with 2 refills for patient of all of her meds until she gets another Physician in [place].

## 2021-09-24 NOTE — Telephone Encounter (Addendum)
Patient here for appointment with Dr  Theodis Shove.  Patient will be relocating to [place] in a few days.  Patient's family accidentally  packed up all of patient's medications and sent them to [place].  Patient is without all of her current meds.  Patient is requesting a refill on all of her medications. Spoke with the Dr. Jimmye Norman who asked that the message be sent to Dr. Clarnce Flock and the Red Team to do the refills for 30 days with 2 refills until patient is able to get an appointment in [place] with her new doctor.  Patient is also asking for refills on her Eye medicatins as well.  Refill all appropriate medications that patient will need.

## 2021-10-11 ENCOUNTER — Other Ambulatory Visit: Payer: Self-pay | Admitting: Obstetrics and Gynecology

## 2021-10-11 NOTE — Patient Outreach (Cosign Needed)
Medicaid Managed Care   Nurse Care Manager Note  10/11/2021 Name:  Jennifer [name] MRN:  [id number] DOB:  01-15-1971  Jennifer [name] is an 51 y.o. year old female who is a primary patient of Jennifer Persia, MD.  The [facility] Managed Care Coordination team was consulted for assistance with:    Chronic healthcare management needs, DM, HTN, vision loss, migraines, depression, HLD  [name] was given information about Medicaid Managed Care Coordination team services today. [name] Patient agreed to services and verbal consent obtained.  Engaged with patient by telephone for follow up visit in response to provider referral for case management and/or care coordination services.   Assessments/Interventions:  Review of past medical history, allergies, medications, health status, including review of consultants reports, laboratory and other test data, was performed as part of comprehensive evaluation and provision of chronic care management services.  SDOH (Social Determinants of Health) assessments and interventions performed: SDOH Interventions    Flowsheet Row Most Recent Value  SDOH Interventions   Financial Strain Interventions Other (Comment)  [family provided with financial, food, housing resources]  Stress Interventions Provide Counseling     Care Plan  No Known Allergies  Medications Reviewed Today     Reviewed by [name], RN (Registered Nurse) on 10/11/21 at 1242  Med List Status: <None>   Medication Order Taking? Sig Documenting Provider Last Dose Status Informant  acetaminophen (TYLENOL) 325 MG tablet [id number] No Take 2 tablets (650 mg total) by mouth every 6 (six) hours as needed for mild pain (or Fever >/= 101). Angiulli, Lavon Paganini, PA-C unk Active Self  apixaban (ELIQUIS) 5 MG TABS tablet [id number]  Take 1 tablet (5 mg total) by mouth 2 (two) times daily. Jennifer Persia, MD  Active   brimonidine [facility]) 0.2 % ophthalmic solution [id number]  Place 1 drop  into the left eye 3 (three) times daily. Jennifer Persia, MD  Active   calcium citrate-vitamin D (CITRACAL+D) 315-200 MG-UNIT tablet [id number] No Take 2 tablets by mouth 2 (two) times daily. Cathlyn Parsons, PA-C 07/25/2021 Active Self  citalopram (CELEXA) 10 MG tablet [id number]  Take 1 tablet (10 mg total) by mouth daily. Jennifer Persia, MD  Active   Patient not taking:  Discontinued 09/27/20 0116   Patient not taking:  Discontinued 09/27/20 0116   dorzolamide-timolol (COSOPT) 22.3-6.8 MG/ML ophthalmic solution [id number]  Place 1 drop into the left eye 2 (two) times daily. Jennifer Persia, MD  Active   gabapentin (NEURONTIN) 300 MG capsule [id number]  Take 1 capsule (300 mg total) by mouth at bedtime. Jennifer Persia, MD  Active   insulin aspart (NOVOLOG FLEXPEN) 100 UNIT/ML FlexPen [id number]  Inject 3 Units into the skin 3 (three) times daily with meals. Jennifer Persia, MD  Active   insulin detemir (LEVEMIR) 100 UNIT/ML FlexPen [id number]  Inject 15 Units into the skin at bedtime. Jennifer Persia, MD  Active   Insulin Pen Needle (BD PEN NEEDLE NANO 2ND GEN) 32G X 4 MM MISC [id number]  Use 1 pen needle 4 times daily to inject insulin Jennifer Persia, MD  Active   Insulin Pen Needle (PEN NEEDLES) 32G X 4 MM MISC [id number] No Use 4 times daily to inject insulin. Jennifer Persia, MD Taking Active Self  metoprolol succinate (TOPROL-XL) 50 MG 24 hr tablet [id number]  Take 1 tablet (50 mg total) by mouth daily. Take with or immediately following a meal. Jennifer Persia, MD  Active   Multiple Vitamin (MULTIVITAMIN WITH MINERALS) TABS tablet [id number] No Take  1 tablet by mouth daily. Debbe Odea, MD Past Week Active Self  rosuvastatin (CRESTOR) 20 MG tablet [id number]  Take 1 tablet (20 mg total) by mouth daily. Maudie Mercury, MD  Active   trimethoprim-polymyxin b [facility]) ophthalmic solution [id number]  Place 1 drop into the left eye every 4 (four) hours. Jennifer Persia, MD  Active   Med List Note  Victoriano Lain, CPhT 10/02/20 2155): Daughter can help           Patient Active Problem List   Diagnosis Date Noted   Venous stasis 09/07/2021   Housing instability, housed unspecified 08/16/2021   Atrial fibrillation with RVR (HCC)    Grade I diastolic dysfunction 75/64/3329   Paroxysmal atrial fibrillation with RVR ([place]) 07/26/2021   Thrombocytopenia ([place]) 07/26/2021   Elevated troponin    History of stroke 05/05/2021   Labile blood glucose    Uncontrolled type 2 diabetes mellitus with hyperglycemia (HCC)    Acute blood loss anemia    Debility 03/17/2021   Glaucoma 03/06/2021   At risk for medication noncompliance 03/06/2021   Physical deconditioning 01/05/2021   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 ([place]) 11/29/2020   Orthostatic hypotension 11/22/2020   Lower extremity weakness 11/22/2020   CKD (chronic kidney disease) stage 3, GFR 30-59 ml/min ([place]) 11/22/2020   Depression 08/22/2020   Weight loss, non-intentional 07/04/2020   Adult failure to thrive 07/04/2020   Hyperglycemia 06/15/2020   Early satiety 05/31/2020   PAF (paroxysmal atrial fibrillation) ([place]) 09/20/2018   Migraine    (HFpEF) heart failure with preserved ejection fraction ([place]) 01/01/2018   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   Pseudophakia of right eye 09/16/2017   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   Fatigue 04/02/2016   Uveitic glaucoma of left eye, 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   Vitamin D deficiency 10/30/2014   Diabetic neuropathy ([place]) 05/13/2013   Healthcare maintenance 02/26/2011   Hyperlipidemia 05/05/2006   Conditions to be addressed/monitored per PCP order:  Chronic healthcare management needs, DM, HTN, vision loss, migraines, depression, HLD  Care Plan : General Plan of Care (Adult)  Updates made by [name], RN since 10/11/2021 12:00 AM     Problem: Health Promotion or Disease Self-Management (General Plan of Care) Resolved 10/11/2021  Priority: High   Onset Date: 08/29/2020     Long-Range Goal: Self-Management Plan Developed Completed 10/11/2021  Start Date: 08/29/2020  Expected End Date: 12/13/2021  Recent Progress: Not on track  Priority: High  Note:   Current Barriers:  Ineffective Self Health Maintenance Patient with diabetes, hypertension, deconditioning, depression Currently UNABLE TO independently self manage needs related to chronic health conditions.  Knowledge Deficits related to short term plan for care coordination needs and long term plans for chronic disease management needs 10/12/21: No complaints today per patient's daughter.  Patient is moving to [place], [place] on Saturday to live with her 16 year old son.  Patient has been without some of her medications for several days.  Patient's daughter to follow up with Pharmacy today.  Explained to patient's daughter that the current  Managed Medicaid team would no longer be able to follow patient once she moves to [place]. Nurse Case Manager Clinical Goal(s):  patient will work with care management team to address care coordination and chronic disease management needs related to Disease Management Educational Needs Care Coordination Medication Management and Education Medication Reconciliation Medication Assistance  Psychosocial Support Mental Health Counseling   Patient will follow provider recommendations in checking blood  sugar three times a day, wearing compression socks and elevating feet,legs, attend BH and nutrition appts. Interventions:  Evaluation of current treatment plan and patient's adherence to plan as established by provider. Reviewed medications with patient/patient's daughter. Collaborated with Pharmacy Pharmacy referral for medication review. Collaborated with Social Work regarding depression.  Referral for financial resources-needs rent assistance. Discussed plans with patient for ongoing care management follow up and provided patient with direct contact  information for care management team Reviewed scheduled/upcoming provider appointments.  Social Work referral for depression. Patient's daughter given [facility] outpatient PT phone number to call for an appointment. Collaborated with PCP regarding blood sugars, PT. Care Guide referral for housing resources-completed. Collaborated with Care Guide Encouraged patient's daughter to contact pharmacy for medications. Self Care Activities:  Patient will self administer medications as prescribed Patient will attend all scheduled provider appointments Patient/DPRwill call pharmacy for medication refills Patient/[name] will attend church or other social activities Patient/[name] will continue to perform ADL's independently Patient/[name] will call provider office for new concerns or questions Patient will work Social Work to address care coordination needs and will continue to work with the clinical team to address health care and disease management related needs.   Patient Goals: In the next 30 days, patient will attend all provider appointments. In the next 30 days, patient will take medications as directed. In the next 30 days, patient/patient's daughter  will speak with Pharmacist.  Follow Up Plan: Per patient's daughter, Patient is moving to [place] to live with 51 year old son.  Explained to patient's daughter, [name],  that the current Managed Medicaid team will no longer to be able to follow patient with move out of state.  Will need to contact Medicaid case Management services for any needs.   Follow Up:  Patient. Patient's daiughter, [name] agrees to Care Plan.  Plan: Per Patient's daughter, patient is moving to [place] to live with her 54 year old son.  Explained to patient's daughter, that the current Managed Medicaid team would no longer be able to follow patient since she is moving out of state.  Will need to contact Medicaid for case management services if needed.

## 2021-10-11 NOTE — Patient Instructions (Signed)
Visit Information  Jennifer [name] . Patient's [name] was given information about [name] care coordination services as a part of their Jennifer [name] benefit. Jennifer [name] / patient's [name] verbally consented to engagement with the [name] Managed Care team.   If you are experiencing a medical emergency, please call 911 or report to your local emergency department or urgent care.   If you have a non-emergency medical problem during routine business hours, please contact your provider's office and ask to speak with a nurse.   For questions related to your [name], please call: [phone] or visit the homepage here: [email or web address]  If you would like to schedule transportation through your [name], please call the following number at least 2 days in advance of your appointment: [phone].  Rides for urgent appointments can also be made after hours by calling Member Services.  Call the [name] at [phone], at any time, 24 hours a day, 7 days a week. If you are in danger or need immediate medical attention call 911.  If you would like help to quit smoking, call 1-800-QUIT-NOW 832 732 3376) OR Espaol: 1-855-Djelo-Ya (8-676-195-0932) o para ms informacin haga clic aqu or Text READY to 200-400 to register via text  Jennifer [name] - following are the goals we discussed in your visit today:   Goals Addressed             This Visit's Progress    COMPLETED: Manage My Emotions       Timeframe:  Long-Range Goal Priority:  High Start Date:    09/01/20                         Expected End 10/11/21               - begin personal counseling - call and visit an old friend - check out volunteer opportunities - join a support group - laugh; watch a funny movie or comedian - learn and use visualization or guided  imagery - perform a random act of kindness - practice relaxation or meditation daily - start or continue a personal journal - talk about feelings with a friend, family or spiritual advisor - practice positive thinking and self-talk    Why is this important?   When you are stressed, down or upset, your body reacts too.  For example, your blood pressure may get higher; you may have a headache or stomachache.  When your emotions get the best of you, your body's ability to fight off cold and flu gets weak.  These steps will help you manage your emotions.      COMPLETED: Protect My Health       Timeframe:  Long-Range Goal Priority:  High Start Date:       08/29/20                      Expected End Date:  10/11/21       - schedule appointment for flu shot - schedule appointment for vaccines needed due to my age or health - schedule recommended health tests (blood work, mammogram, colonoscopy, pap test) - schedule and keep appointment for annual check-up  Why is this important?   Screening tests can find diseases early when they are easier to treat.  Your doctor or nurse will talk with you about which tests are important for you.  Getting  shots for common diseases like the flu and shingles will help prevent them.     Patient / [name] verbalizes understanding of instructions and care plan provided today and agrees to view in [name]. Active MyChart status and patient / [name] understanding of how to access instructions and care plan via MyChart confirmed with patient/[name]     Per Patient's daughter, patient is moving to [place] to live with her 53 year old son.  Explained to patient's daughter, that the current [name] would no longer be able to follow patient since she is moving out of state.  Will need to contact [name] for case management services if needed  Jennifer Raider RN, [place] Management Coordinator - Managed [place] High  Risk [phone]   Following is a copy of your plan of care:  Care Plan : General Plan of Care (Adult)  Updates made by Jennifer Medicus, RN since 10/11/2021 12:00 AM     Problem: Health Promotion or Disease Self-Management (General Plan of Care) Resolved 10/11/2021  Priority: High  Onset Date: 08/29/2020     Long-Range Goal: Self-Management Plan Developed Completed 10/11/2021  Start Date: 08/29/2020  Expected End Date: 12/13/2021  Recent Progress: Not on track  Priority: High  Note:   Current Barriers:  Ineffective Self Health Maintenance Patient with diabetes, hypertension, deconditioning, depression Currently UNABLE TO independently self manage needs related to chronic health conditions.  Knowledge Deficits related to short term plan for care coordination needs and long term plans for chronic disease management needs 10/12/21: No complaints today per patient's daughter.  Patient is moving to [place], [place] on Saturday to live with her 44 year old son.  Patient has been without some of her medications for several days.  Patient's daughter to follow up with Pharmacy today.  Explained to patient's daughter that the current  [name] would no longer be able to follow patient once she moves to [place]. Nurse Case Manager Clinical Goal(s):  patient will work with care management team to address care coordination and chronic disease management needs related to Disease Management Educational Needs Care Coordination Medication Management and Education Medication Reconciliation Medication Assistance  Psychosocial Support Mental Health Counseling   Patient will follow provider recommendations in checking blood sugar three times a day, wearing compression socks and elevating feet,legs, attend [place] and nutrition appts. Interventions:  Evaluation of current treatment plan and patient's adherence to plan as established by provider. Reviewed medications with patient/patient's  daughter. Collaborated with Pharmacy Pharmacy referral for medication review. Collaborated with Social Work regarding depression.  Referral for financial resources-needs rent assistance. Discussed plans with patient for ongoing care management follow up and provided patient with direct contact information for care management team Reviewed scheduled/upcoming provider appointments.  Social Work referral for depression. Patient's daughter given [facility] outpatient PT phone number to call for an appointment. Collaborated with PCP regarding blood sugars, PT. Care Guide referral for housing resources-completed. Collaborated with Care Guide Encouraged patient's daughter to contact pharmacy for medications. Self Care Activities:  Patient will self administer medications as prescribed Patient will attend all scheduled provider appointments Patient/DPRwill call pharmacy for medication refills Patient/[name] will attend church or other social activities Patient/[name] will continue to perform ADL's independently Patient/[name] will call provider office for new concerns or questions Patient will work Social Work to address care coordination needs and will continue to work with the clinical team to address health care and disease management related needs.  Patient Goals: In the next 30 days, patient will attend all provider appointments. In the next 30 days, patient will take medications as directed. In the next 30 days, patient/patient's daughter  will speak with Pharmacist.  Follow Up Plan: Per patient's daughter, Patient is moving to [place] to live with 42 year old son.  Explained to patient's daughter, [name],  that the current [name] will no longer to be able to follow patient with move out of state.  Will need to contact [name] case Management services for any needs.

## 2021-10-30 ENCOUNTER — Encounter: Payer: Medicaid Other | Admitting: Physical Medicine and Rehabilitation

## 2021-11-12 ENCOUNTER — Ambulatory Visit: Payer: Medicaid Other | Admitting: Physical Medicine and Rehabilitation

## 2021-11-16 ENCOUNTER — Ambulatory Visit: Payer: Medicaid Other

## 2022-02-19 LAB — HISTOPLASMA ANTIGEN, URINE: Histoplasma Antigen, urine: <0.5 (ref <0.5)

## 2022-03-22 LAB — HISTOPLASMA GAL'MANNAN AG SER: Histoplasma Gal'mannan Ag Ser: <0.5

## 2022-09-10 ENCOUNTER — Other Ambulatory Visit: Payer: Self-pay

## 2022-10-13 IMAGING — CT CT ABD-PELV W/ CM
2 of 5 series · 15 of 46 positions shown, 17 images · IV contrast (APPLIED)
Comparison: 03/15/2020 and 03/08/2020.

CLINICAL DATA: 49-year-old with nonlocalized abdominal pain.

EXAM:
CT ABDOMEN AND PELVIS WITH CONTRAST
TECHNIQUE: Multidetector CT imaging of the abdomen and pelvis was performed
using the standard protocol following bolus administration of
intravenous contrast.
CONTRAST:  80mL OMNIPAQUE IOHEXOL 300 MG/ML  SOLN

[Series 3: abd/ pelvis 5.0 i30f 2 · axial · 0.81mm/px · z∈[-662,-232]mm · 12 of 98 slices shown, 14 images]
[im 6/98  soft-tissue]
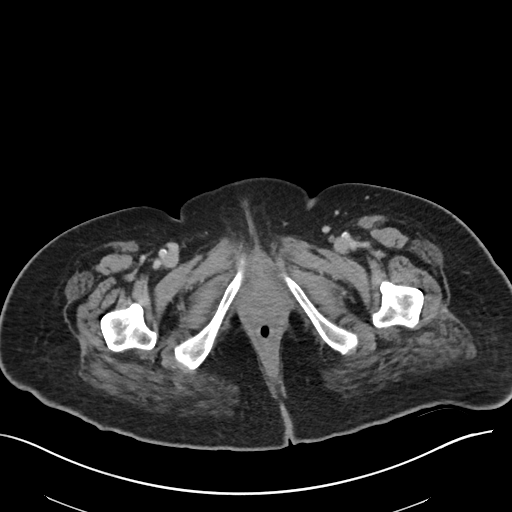
[im 6/98  bone]
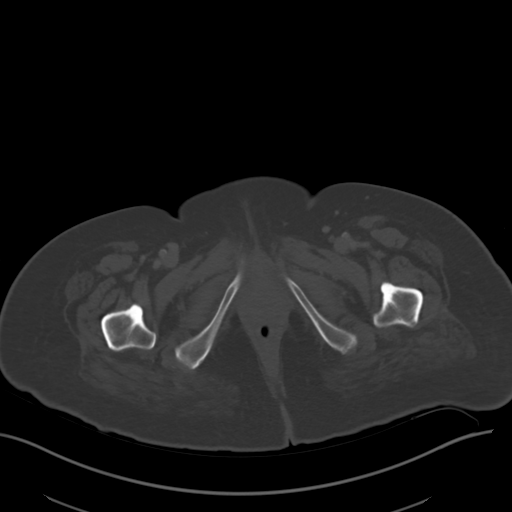
[im 16/98  soft-tissue]
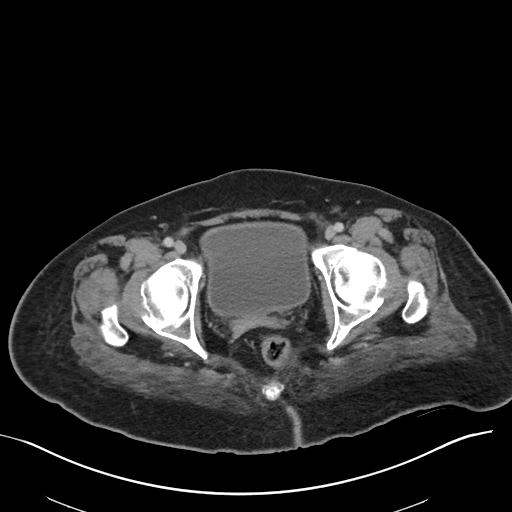
[im 21/98  soft-tissue]
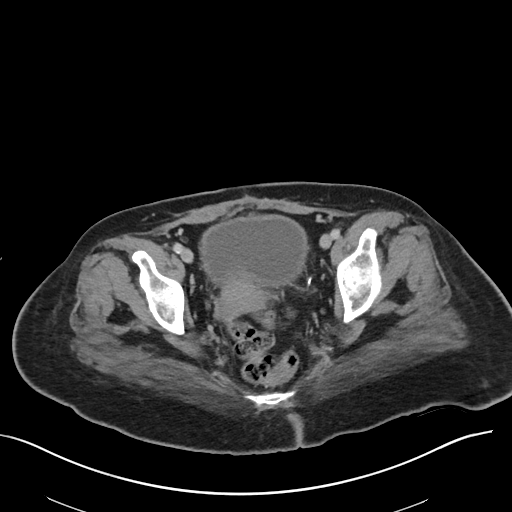
[im 31/98  soft-tissue]
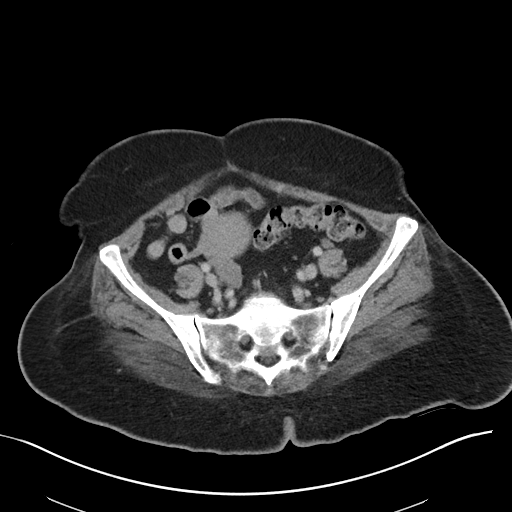
[im 36/98  soft-tissue]
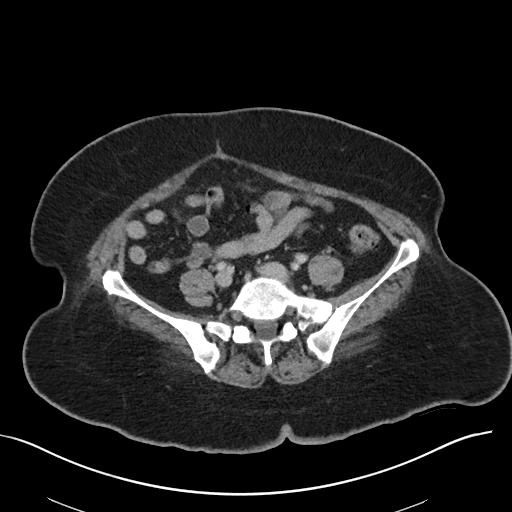
[im 46/98  soft-tissue]
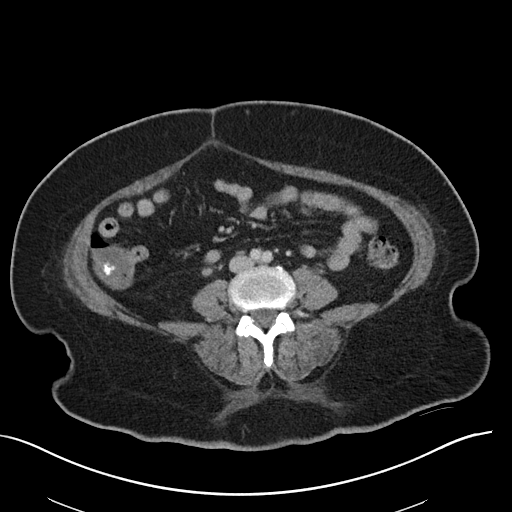
[im 52/98  soft-tissue]
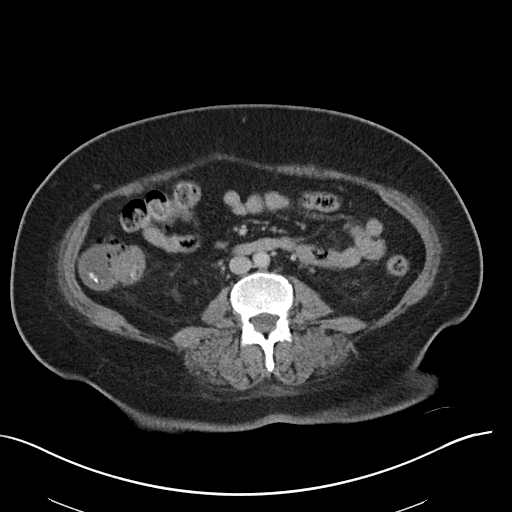
[im 62/98  soft-tissue]
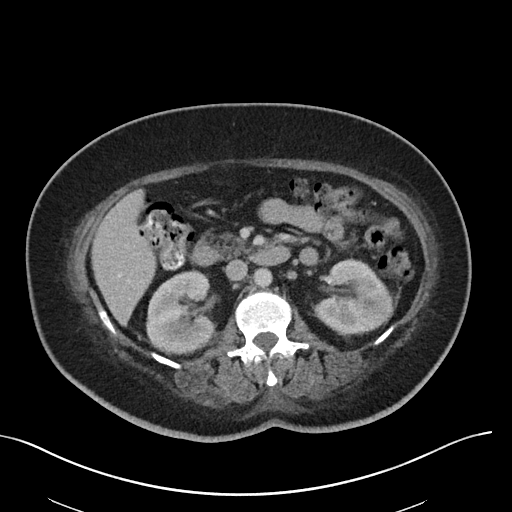
[im 67/98  soft-tissue]
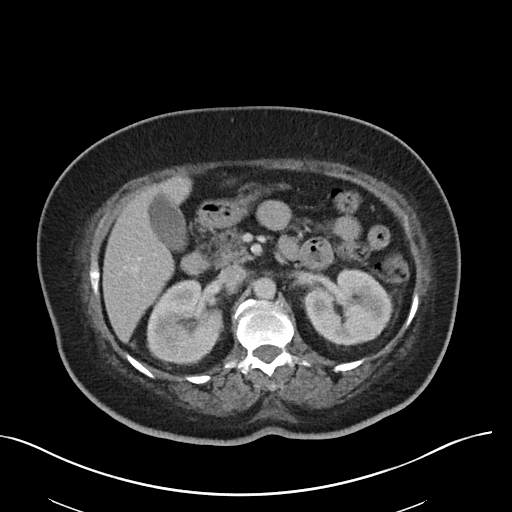
[im 67/98  bone]
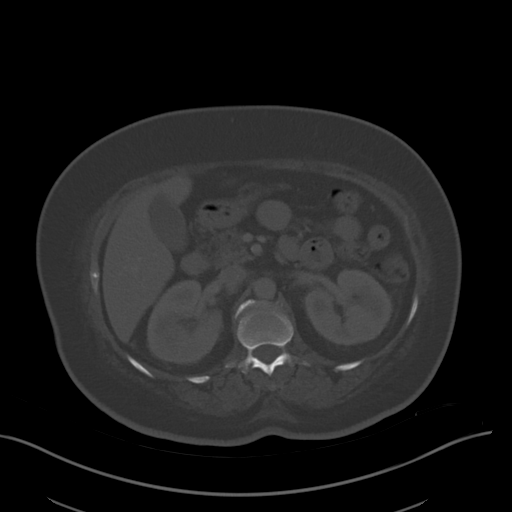
[im 77/98  soft-tissue]
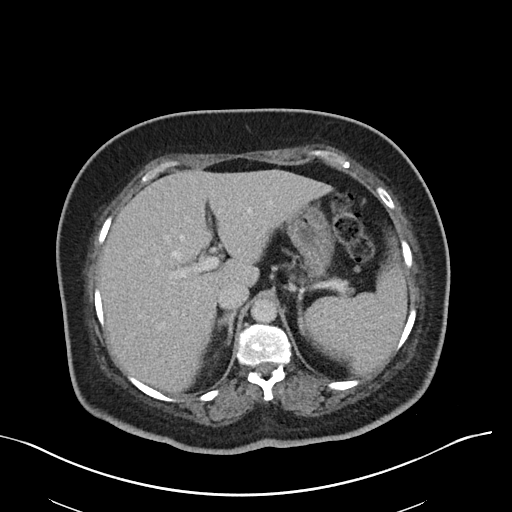
[im 82/98  soft-tissue]
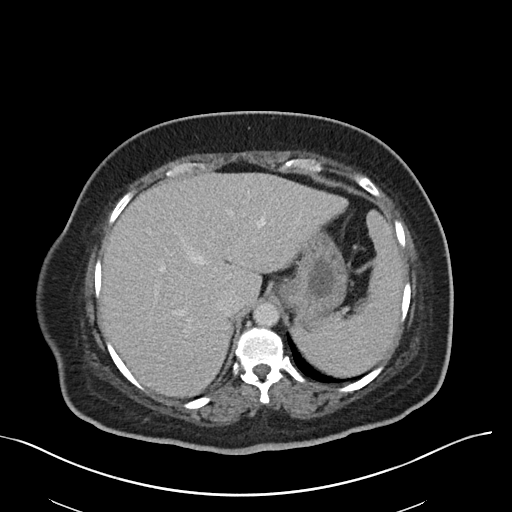
[im 92/98  soft-tissue]
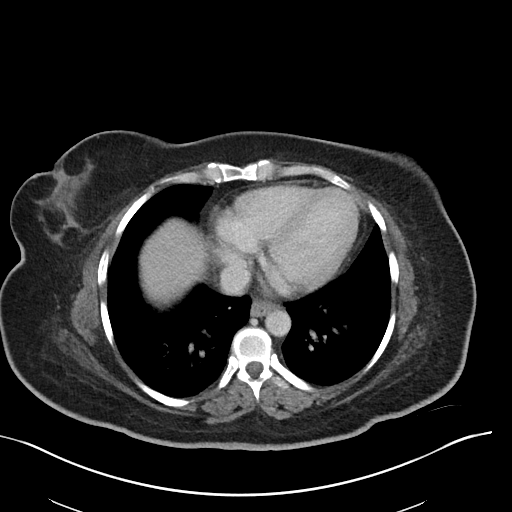

[Series 6: coronal soft tissue · coronal · 0.74mm/px · 3 of 76 slices shown]
[im 26/76  soft-tissue]
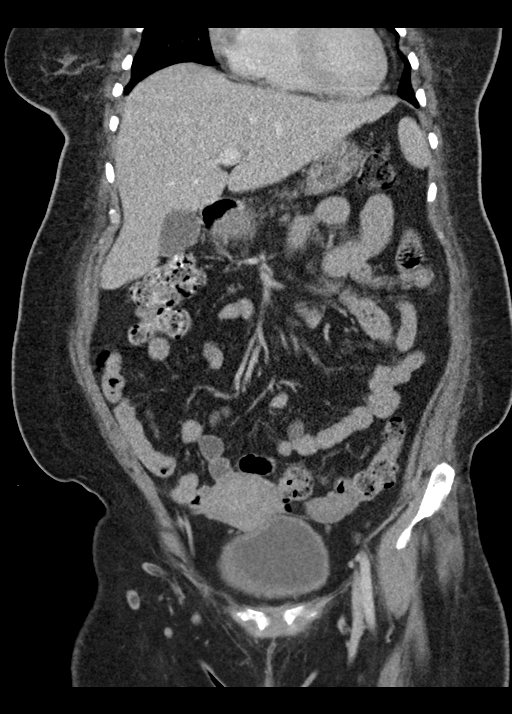
[im 34/76  soft-tissue]
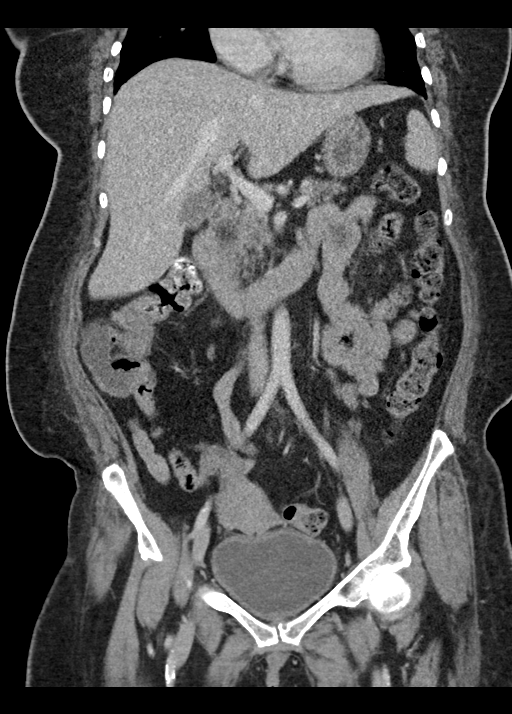
[im 42/76  soft-tissue]
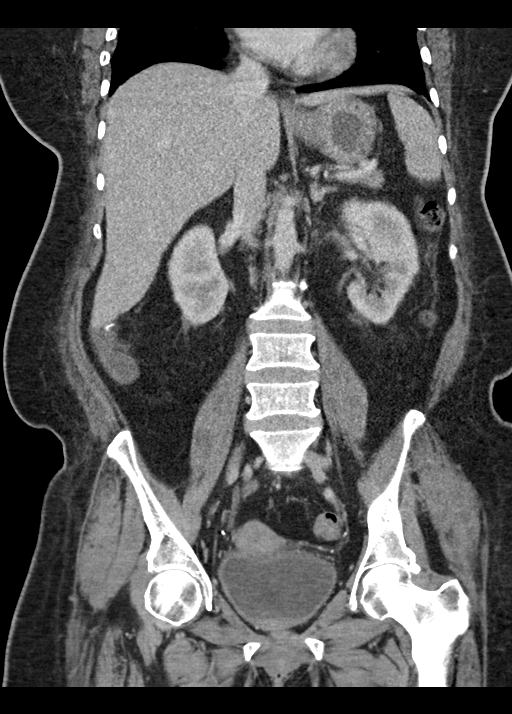

[15 of 46 positions shown; findings below may reference images not displayed]

FINDINGS: Lower chest: Lung bases are clear.  No pleural effusions.

Hepatobiliary: No focal liver abnormality is seen. No gallstones,
gallbladder wall thickening, or biliary dilatation.

Pancreas: Unremarkable. No pancreatic ductal dilatation or
surrounding inflammatory changes.

Spleen: Normal in size without focal abnormality.

Adrenals/Urinary Tract: Normal appearance of both adrenal glands.
Enhancement pattern is slightly heterogeneous in the right kidney
with decreased enhancement along the medial right upper pole and
posterior mid aspect of the right kidney. These findings are best
seen on the delayed images. These findings are different from the
exam on 03/08/2020. Findings are suggestive for inflammation and
pyelonephritis. Focal low-density in the posterolateral left kidney
on sequence 8, image 18 is also suggestive for focus of
inflammation. Focal low-density area in the left posterolateral
aspect of left kidney measures roughly 8 mm. Mild perinephric edema.
Negative for hydronephrosis. No evidence for obstructing urinary
stones. Bladder is mildly distended. Again noted is a tiny
calcification along the anterior wall of the urinary bladder. Mild
urinary bladder wall thickening based on the degree of distension.

Stomach/Bowel: Small amount of high-density material in the right
colon likely represents residual contrast from study in March 15, 2020. Appendix has a normal appearance without inflammatory changes.
There is no evidence for bowel obstruction or focal bowel
inflammation.

Vascular/Lymphatic: Abdominal aorta and main visceral arteries are
patent. Main portal venous system is patent. IVC and renal veins
have a normal appearance. Atherosclerotic calcifications in the
proximal femoral arteries. No significant lymph node enlargement in
the abdomen or pelvis.

Reproductive: Uterus and bilateral adnexa are unremarkable.

Other: Negative for ascites. Negative for free air. Tiny umbilical
hernia containing fat.

Musculoskeletal: No acute bone abnormality.
IMPRESSION: 1. Heterogeneous enhancement involving both kidneys. Findings are
suggestive for bilateral pyelonephritis. Focal low density in the
posterolateral left kidney measures roughly 8 mm and could represent
focal phlegmon. No discrete renal abscesses at this time.
2. Mild urinary bladder wall thickening and likely related to
cystitis based on the renal findings.
